# Patient Record
Sex: Female | Born: 1940 | Race: Black or African American | Hispanic: No | Smoking: Never smoker
Health system: Southern US, Community
[De-identification: ages and names within clinical notes are randomized; demographics above are authoritative.]

## PROBLEM LIST (undated history)

## (undated) DIAGNOSIS — M545 Low back pain, unspecified: Secondary | ICD-10-CM

## (undated) DIAGNOSIS — J449 Chronic obstructive pulmonary disease, unspecified: Secondary | ICD-10-CM

## (undated) DIAGNOSIS — R569 Unspecified convulsions: Secondary | ICD-10-CM

## (undated) DIAGNOSIS — G43909 Migraine, unspecified, not intractable, without status migrainosus: Secondary | ICD-10-CM

## (undated) DIAGNOSIS — E669 Obesity, unspecified: Secondary | ICD-10-CM

## (undated) DIAGNOSIS — N3941 Urge incontinence: Secondary | ICD-10-CM

## (undated) DIAGNOSIS — H538 Other visual disturbances: Secondary | ICD-10-CM

## (undated) DIAGNOSIS — J45909 Unspecified asthma, uncomplicated: Secondary | ICD-10-CM

## (undated) DIAGNOSIS — K279 Peptic ulcer, site unspecified, unspecified as acute or chronic, without hemorrhage or perforation: Secondary | ICD-10-CM

## (undated) DIAGNOSIS — I2510 Atherosclerotic heart disease of native coronary artery without angina pectoris: Secondary | ICD-10-CM

## (undated) DIAGNOSIS — D649 Anemia, unspecified: Secondary | ICD-10-CM

## (undated) DIAGNOSIS — Z8719 Personal history of other diseases of the digestive system: Secondary | ICD-10-CM

## (undated) DIAGNOSIS — I10 Essential (primary) hypertension: Secondary | ICD-10-CM

## (undated) DIAGNOSIS — I4891 Unspecified atrial fibrillation: Secondary | ICD-10-CM

## (undated) DIAGNOSIS — E785 Hyperlipidemia, unspecified: Secondary | ICD-10-CM

## (undated) DIAGNOSIS — G4730 Sleep apnea, unspecified: Secondary | ICD-10-CM

## (undated) DIAGNOSIS — G8929 Other chronic pain: Secondary | ICD-10-CM

## (undated) DIAGNOSIS — R410 Disorientation, unspecified: Secondary | ICD-10-CM

## (undated) DIAGNOSIS — I4430 Unspecified atrioventricular block: Secondary | ICD-10-CM

## (undated) DIAGNOSIS — M1990 Unspecified osteoarthritis, unspecified site: Secondary | ICD-10-CM

## (undated) DIAGNOSIS — I82621 Acute embolism and thrombosis of deep veins of right upper extremity: Secondary | ICD-10-CM

## (undated) DIAGNOSIS — I209 Angina pectoris, unspecified: Secondary | ICD-10-CM

## (undated) DIAGNOSIS — K219 Gastro-esophageal reflux disease without esophagitis: Secondary | ICD-10-CM

## (undated) DIAGNOSIS — Z950 Presence of cardiac pacemaker: Secondary | ICD-10-CM

## (undated) DIAGNOSIS — E119 Type 2 diabetes mellitus without complications: Secondary | ICD-10-CM

## (undated) DIAGNOSIS — F0390 Unspecified dementia without behavioral disturbance: Secondary | ICD-10-CM

## (undated) DIAGNOSIS — T8132XA Disruption of internal operation (surgical) wound, not elsewhere classified, initial encounter: Secondary | ICD-10-CM

## (undated) DIAGNOSIS — IMO0002 Reserved for concepts with insufficient information to code with codable children: Secondary | ICD-10-CM

## (undated) DIAGNOSIS — R0601 Orthopnea: Secondary | ICD-10-CM

## (undated) DIAGNOSIS — Z87442 Personal history of urinary calculi: Secondary | ICD-10-CM

## (undated) DIAGNOSIS — I639 Cerebral infarction, unspecified: Secondary | ICD-10-CM

## (undated) HISTORY — DX: Type 2 diabetes mellitus without complications: E11.9

## (undated) HISTORY — PX: THYROIDECTOMY: SHX17

## (undated) HISTORY — DX: Gastro-esophageal reflux disease without esophagitis: K21.9

## (undated) HISTORY — DX: Reserved for concepts with insufficient information to code with codable children: IMO0002

## (undated) HISTORY — DX: Essential (primary) hypertension: I10

## (undated) HISTORY — DX: Unspecified atrioventricular block: I44.30

## (undated) HISTORY — DX: Hyperlipidemia, unspecified: E78.5

## (undated) HISTORY — PX: APPENDECTOMY: SHX54

## (undated) HISTORY — DX: Acute embolism and thrombosis of deep veins of right upper extremity: I82.621

## (undated) HISTORY — PX: RECONSTRUCTIVE REPAIR STERNAL: SUR1094

## (undated) HISTORY — PX: CHOLECYSTECTOMY: SHX55

## (undated) HISTORY — DX: Chronic obstructive pulmonary disease, unspecified: J44.9

## (undated) HISTORY — DX: Obesity, unspecified: E66.9

## (undated) HISTORY — PX: JOINT REPLACEMENT: SHX530

## (undated) HISTORY — DX: Disruption of internal operation (surgical) wound, not elsewhere classified, initial encounter: T81.32XA

## (undated) HISTORY — PX: REPLACEMENT TOTAL KNEE: SUR1224

## (undated) HISTORY — PX: WRIST SURGERY: SHX841

## (undated) HISTORY — DX: Atherosclerotic heart disease of native coronary artery without angina pectoris: I25.10

## (undated) HISTORY — PX: TUBAL LIGATION: SHX77

## (undated) HISTORY — PX: BACK SURGERY: SHX140

## (undated) HISTORY — PX: POSTERIOR LUMBAR FUSION: SHX6036

## (undated) HISTORY — PX: ESOPHAGOGASTRODUODENOSCOPY: SHX1529

## (undated) HISTORY — PX: ABDOMINAL HYSTERECTOMY: SHX81

## (undated) HISTORY — PX: LUMBAR DISC SURGERY: SHX700

## (undated) HISTORY — PX: TOTAL HIP ARTHROPLASTY: SHX124

---

## 1991-12-06 DIAGNOSIS — I4430 Unspecified atrioventricular block: Secondary | ICD-10-CM

## 1991-12-06 HISTORY — PX: INSERT / REPLACE / REMOVE PACEMAKER: SUR710

## 1991-12-06 HISTORY — PX: PACEMAKER PLACEMENT: SHX43

## 1991-12-06 HISTORY — DX: Unspecified atrioventricular block: I44.30

## 1999-05-06 HISTORY — PX: CORONARY ARTERY BYPASS GRAFT: SHX141

## 2001-01-30 ENCOUNTER — Ambulatory Visit (HOSPITAL_COMMUNITY): Admission: RE | Admit: 2001-01-30 | Discharge: 2001-01-30 | Payer: Self-pay | Admitting: Neurosurgery

## 2001-01-30 ENCOUNTER — Encounter: Payer: Self-pay | Admitting: Neurosurgery

## 2001-05-30 ENCOUNTER — Ambulatory Visit (HOSPITAL_COMMUNITY): Admission: RE | Admit: 2001-05-30 | Discharge: 2001-05-30 | Payer: Self-pay | Admitting: Pulmonary Disease

## 2001-06-05 ENCOUNTER — Ambulatory Visit (HOSPITAL_COMMUNITY): Admission: RE | Admit: 2001-06-05 | Discharge: 2001-06-05 | Payer: Self-pay | Admitting: Pulmonary Disease

## 2001-07-16 ENCOUNTER — Ambulatory Visit (HOSPITAL_COMMUNITY): Admission: RE | Admit: 2001-07-16 | Discharge: 2001-07-16 | Payer: Self-pay | Admitting: Pulmonary Disease

## 2001-07-18 ENCOUNTER — Ambulatory Visit (HOSPITAL_COMMUNITY): Admission: RE | Admit: 2001-07-18 | Discharge: 2001-07-18 | Payer: Self-pay | Admitting: Pulmonary Disease

## 2001-07-29 ENCOUNTER — Emergency Department (HOSPITAL_COMMUNITY): Admission: EM | Admit: 2001-07-29 | Discharge: 2001-07-29 | Payer: Self-pay | Admitting: Emergency Medicine

## 2001-07-29 ENCOUNTER — Encounter: Payer: Self-pay | Admitting: Emergency Medicine

## 2001-12-14 ENCOUNTER — Other Ambulatory Visit: Admission: RE | Admit: 2001-12-14 | Discharge: 2001-12-14 | Payer: Self-pay | Admitting: Internal Medicine

## 2002-05-13 ENCOUNTER — Ambulatory Visit (HOSPITAL_COMMUNITY): Admission: RE | Admit: 2002-05-13 | Discharge: 2002-05-13 | Payer: Self-pay | Admitting: Pulmonary Disease

## 2002-05-30 ENCOUNTER — Ambulatory Visit (HOSPITAL_COMMUNITY): Admission: RE | Admit: 2002-05-30 | Discharge: 2002-05-30 | Payer: Self-pay | Admitting: Pulmonary Disease

## 2002-05-31 ENCOUNTER — Encounter: Payer: Self-pay | Admitting: Emergency Medicine

## 2002-05-31 ENCOUNTER — Emergency Department (HOSPITAL_COMMUNITY): Admission: EM | Admit: 2002-05-31 | Discharge: 2002-06-01 | Payer: Self-pay | Admitting: Emergency Medicine

## 2002-08-20 ENCOUNTER — Emergency Department (HOSPITAL_COMMUNITY): Admission: EM | Admit: 2002-08-20 | Discharge: 2002-08-21 | Payer: Self-pay | Admitting: Emergency Medicine

## 2002-08-20 ENCOUNTER — Encounter: Payer: Self-pay | Admitting: Emergency Medicine

## 2002-08-20 ENCOUNTER — Encounter: Payer: Self-pay | Admitting: Orthopedic Surgery

## 2002-08-21 ENCOUNTER — Encounter: Payer: Self-pay | Admitting: Orthopedic Surgery

## 2002-08-23 ENCOUNTER — Emergency Department (HOSPITAL_COMMUNITY): Admission: EM | Admit: 2002-08-23 | Discharge: 2002-08-23 | Payer: Self-pay | Admitting: Emergency Medicine

## 2002-11-01 ENCOUNTER — Ambulatory Visit (HOSPITAL_COMMUNITY): Admission: RE | Admit: 2002-11-01 | Discharge: 2002-11-01 | Payer: Self-pay | Admitting: Cardiology

## 2002-11-01 ENCOUNTER — Encounter: Payer: Self-pay | Admitting: Cardiology

## 2003-06-05 ENCOUNTER — Ambulatory Visit (HOSPITAL_COMMUNITY): Admission: RE | Admit: 2003-06-05 | Discharge: 2003-06-05 | Payer: Self-pay | Admitting: Pulmonary Disease

## 2003-12-10 ENCOUNTER — Encounter (INDEPENDENT_AMBULATORY_CARE_PROVIDER_SITE_OTHER): Payer: Self-pay | Admitting: *Deleted

## 2003-12-10 ENCOUNTER — Ambulatory Visit (HOSPITAL_COMMUNITY): Admission: RE | Admit: 2003-12-10 | Discharge: 2003-12-10 | Payer: Self-pay | Admitting: Orthopedic Surgery

## 2003-12-10 ENCOUNTER — Ambulatory Visit (HOSPITAL_BASED_OUTPATIENT_CLINIC_OR_DEPARTMENT_OTHER): Admission: RE | Admit: 2003-12-10 | Discharge: 2003-12-10 | Payer: Self-pay | Admitting: Orthopedic Surgery

## 2004-06-10 ENCOUNTER — Ambulatory Visit (HOSPITAL_COMMUNITY): Admission: RE | Admit: 2004-06-10 | Discharge: 2004-06-10 | Payer: Self-pay | Admitting: Pulmonary Disease

## 2004-07-28 ENCOUNTER — Emergency Department (HOSPITAL_COMMUNITY): Admission: EM | Admit: 2004-07-28 | Discharge: 2004-07-28 | Payer: Self-pay | Admitting: Emergency Medicine

## 2004-08-10 ENCOUNTER — Emergency Department (HOSPITAL_COMMUNITY): Admission: EM | Admit: 2004-08-10 | Discharge: 2004-08-10 | Payer: Self-pay | Admitting: Emergency Medicine

## 2004-08-20 ENCOUNTER — Ambulatory Visit: Payer: Self-pay | Admitting: Cardiology

## 2004-11-05 ENCOUNTER — Encounter: Payer: Self-pay | Admitting: Orthopedic Surgery

## 2004-12-05 ENCOUNTER — Encounter: Payer: Self-pay | Admitting: Orthopedic Surgery

## 2004-12-31 ENCOUNTER — Ambulatory Visit (HOSPITAL_COMMUNITY): Admission: RE | Admit: 2004-12-31 | Discharge: 2004-12-31 | Payer: Self-pay | Admitting: Pulmonary Disease

## 2005-01-02 ENCOUNTER — Emergency Department (HOSPITAL_COMMUNITY): Admission: EM | Admit: 2005-01-02 | Discharge: 2005-01-03 | Payer: Self-pay | Admitting: Emergency Medicine

## 2005-01-05 ENCOUNTER — Encounter: Payer: Self-pay | Admitting: Orthopedic Surgery

## 2005-02-02 ENCOUNTER — Encounter: Payer: Self-pay | Admitting: Orthopedic Surgery

## 2005-02-18 ENCOUNTER — Emergency Department (HOSPITAL_COMMUNITY): Admission: EM | Admit: 2005-02-18 | Discharge: 2005-02-18 | Payer: Self-pay | Admitting: Emergency Medicine

## 2005-04-06 ENCOUNTER — Ambulatory Visit: Payer: Self-pay | Admitting: Cardiology

## 2005-04-22 ENCOUNTER — Ambulatory Visit: Payer: Self-pay | Admitting: Internal Medicine

## 2005-04-27 ENCOUNTER — Ambulatory Visit: Payer: Self-pay | Admitting: Internal Medicine

## 2005-04-27 ENCOUNTER — Ambulatory Visit (HOSPITAL_COMMUNITY): Admission: RE | Admit: 2005-04-27 | Discharge: 2005-04-27 | Payer: Self-pay | Admitting: Internal Medicine

## 2005-04-27 HISTORY — PX: COLONOSCOPY: SHX174

## 2005-05-06 ENCOUNTER — Ambulatory Visit: Payer: Self-pay | Admitting: Cardiology

## 2005-06-17 ENCOUNTER — Ambulatory Visit (HOSPITAL_COMMUNITY): Admission: RE | Admit: 2005-06-17 | Discharge: 2005-06-17 | Payer: Self-pay | Admitting: Pulmonary Disease

## 2005-07-08 ENCOUNTER — Ambulatory Visit (HOSPITAL_COMMUNITY): Admission: RE | Admit: 2005-07-08 | Discharge: 2005-07-08 | Payer: Self-pay | Admitting: Pulmonary Disease

## 2005-07-25 ENCOUNTER — Encounter: Payer: Self-pay | Admitting: Orthopedic Surgery

## 2005-08-05 ENCOUNTER — Encounter: Payer: Self-pay | Admitting: Orthopedic Surgery

## 2005-08-09 ENCOUNTER — Ambulatory Visit: Payer: Self-pay | Admitting: Cardiology

## 2005-09-04 ENCOUNTER — Encounter: Payer: Self-pay | Admitting: Orthopedic Surgery

## 2005-10-14 ENCOUNTER — Encounter: Payer: Self-pay | Admitting: Orthopedic Surgery

## 2005-11-04 ENCOUNTER — Encounter: Payer: Self-pay | Admitting: Orthopedic Surgery

## 2005-11-08 ENCOUNTER — Ambulatory Visit: Payer: Self-pay | Admitting: Cardiology

## 2005-11-28 ENCOUNTER — Emergency Department (HOSPITAL_COMMUNITY): Admission: EM | Admit: 2005-11-28 | Discharge: 2005-11-28 | Payer: Self-pay | Admitting: Emergency Medicine

## 2005-12-02 ENCOUNTER — Ambulatory Visit (HOSPITAL_COMMUNITY): Payer: Self-pay | Admitting: Pulmonary Disease

## 2005-12-02 ENCOUNTER — Encounter (HOSPITAL_COMMUNITY): Admission: RE | Admit: 2005-12-02 | Discharge: 2006-01-01 | Payer: Self-pay | Admitting: Pulmonary Disease

## 2005-12-12 ENCOUNTER — Ambulatory Visit: Payer: Self-pay | Admitting: Cardiology

## 2006-01-19 ENCOUNTER — Ambulatory Visit: Payer: Self-pay | Admitting: Cardiology
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
## 2006-02-20 ENCOUNTER — Ambulatory Visit: Payer: Self-pay | Admitting: Cardiology

## 2006-04-10 ENCOUNTER — Ambulatory Visit: Payer: Self-pay | Admitting: Cardiology

## 2006-04-12 ENCOUNTER — Ambulatory Visit: Payer: Self-pay | Admitting: Internal Medicine

## 2006-04-26 ENCOUNTER — Ambulatory Visit: Payer: Self-pay | Admitting: Internal Medicine

## 2006-04-26 ENCOUNTER — Ambulatory Visit (HOSPITAL_COMMUNITY): Admission: RE | Admit: 2006-04-26 | Discharge: 2006-04-26 | Payer: Self-pay | Admitting: Internal Medicine

## 2006-05-10 ENCOUNTER — Ambulatory Visit: Payer: Self-pay | Admitting: Cardiology

## 2006-06-15 ENCOUNTER — Ambulatory Visit: Payer: Self-pay | Admitting: Cardiology

## 2006-06-19 ENCOUNTER — Ambulatory Visit (HOSPITAL_COMMUNITY): Admission: RE | Admit: 2006-06-19 | Discharge: 2006-06-19 | Payer: Self-pay | Admitting: Pulmonary Disease

## 2006-07-28 ENCOUNTER — Ambulatory Visit: Payer: Self-pay | Admitting: Internal Medicine

## 2006-08-09 ENCOUNTER — Ambulatory Visit: Payer: Self-pay | Admitting: Cardiology

## 2006-08-18 ENCOUNTER — Emergency Department (HOSPITAL_COMMUNITY): Admission: EM | Admit: 2006-08-18 | Discharge: 2006-08-18 | Payer: Self-pay | Admitting: Emergency Medicine

## 2006-09-01 ENCOUNTER — Ambulatory Visit: Payer: Self-pay | Admitting: Internal Medicine

## 2006-09-14 ENCOUNTER — Ambulatory Visit: Payer: Self-pay | Admitting: Cardiology

## 2006-10-06 ENCOUNTER — Ambulatory Visit (HOSPITAL_COMMUNITY): Admission: RE | Admit: 2006-10-06 | Discharge: 2006-10-06 | Payer: Self-pay | Admitting: Internal Medicine

## 2006-10-06 ENCOUNTER — Ambulatory Visit: Payer: Self-pay | Admitting: Internal Medicine

## 2006-10-12 ENCOUNTER — Ambulatory Visit: Payer: Self-pay | Admitting: Cardiology

## 2006-11-08 ENCOUNTER — Ambulatory Visit: Payer: Self-pay | Admitting: Cardiology

## 2006-12-07 ENCOUNTER — Ambulatory Visit: Payer: Self-pay | Admitting: Cardiology

## 2007-01-04 IMAGING — CR DG CHEST 2V
2 series · 2 of 2 positions shown · non-contrast
Comparison: none

CLINICAL DATA: Chest pain, shortness of breath

[view not recorded (1 of 2)]
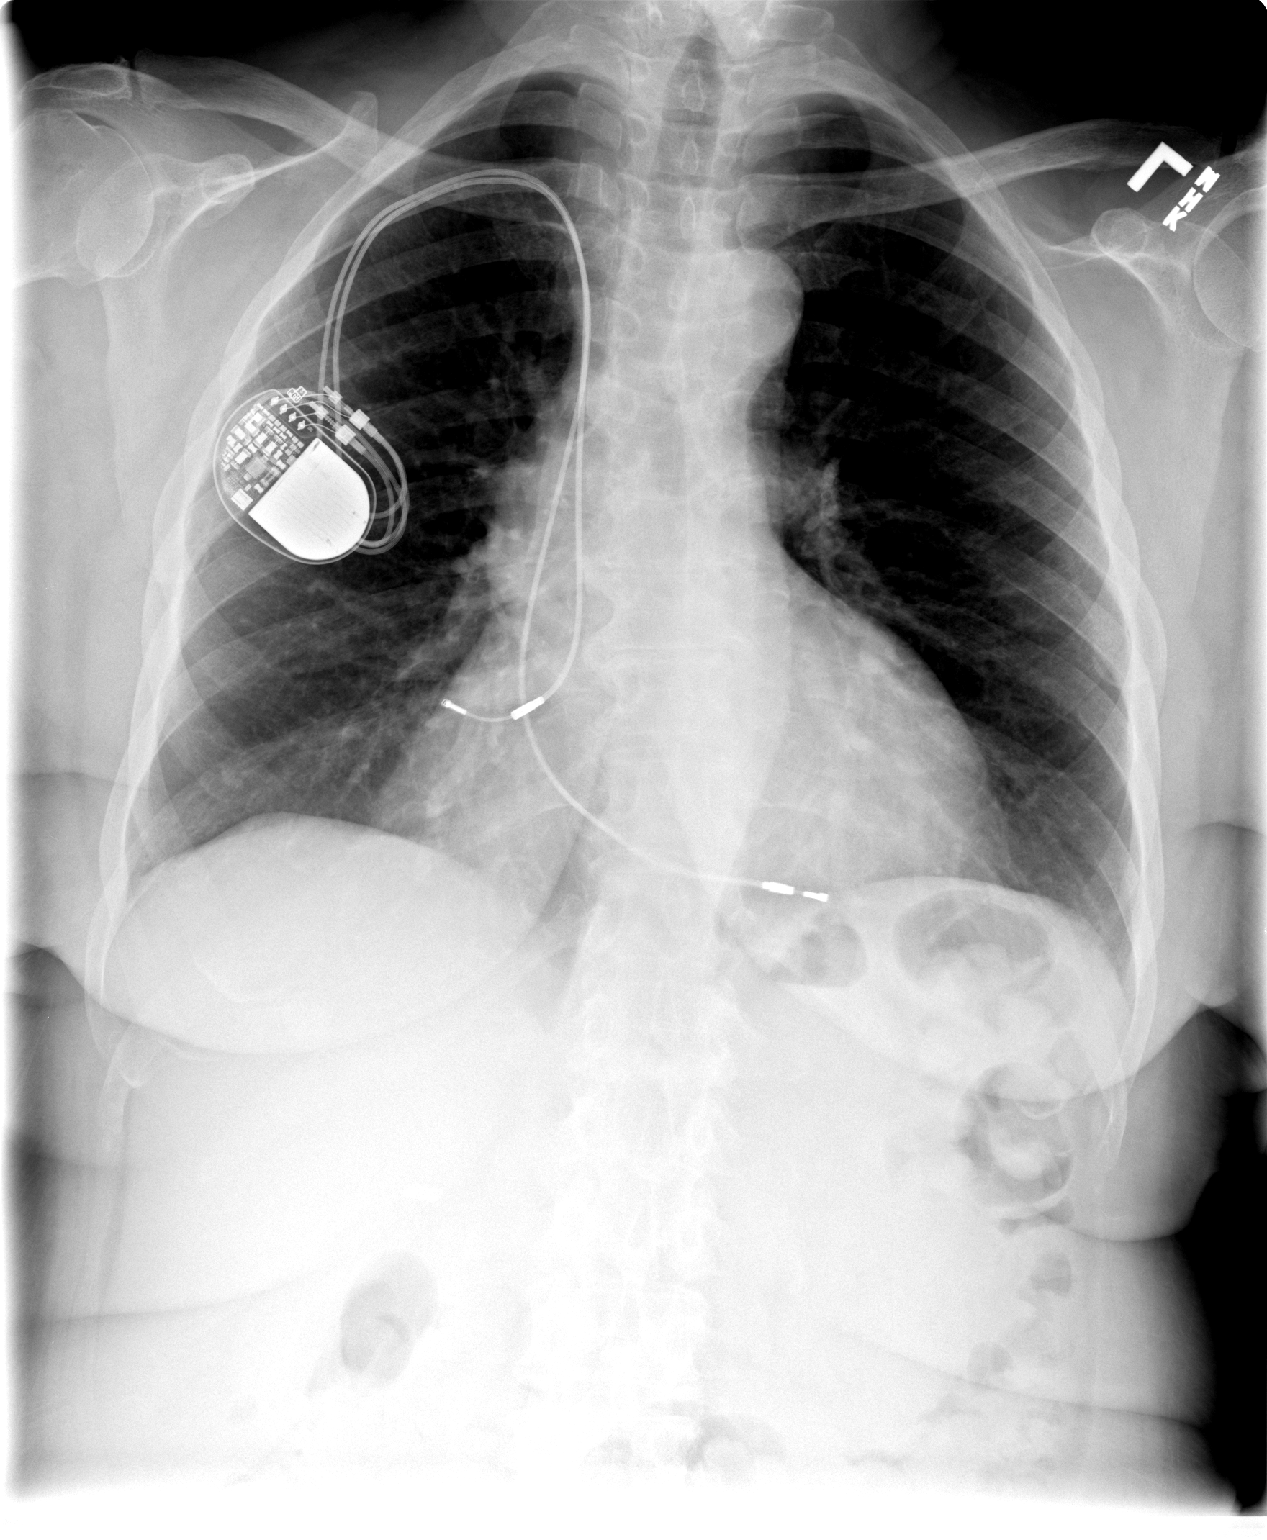

[view not recorded (2 of 2)]
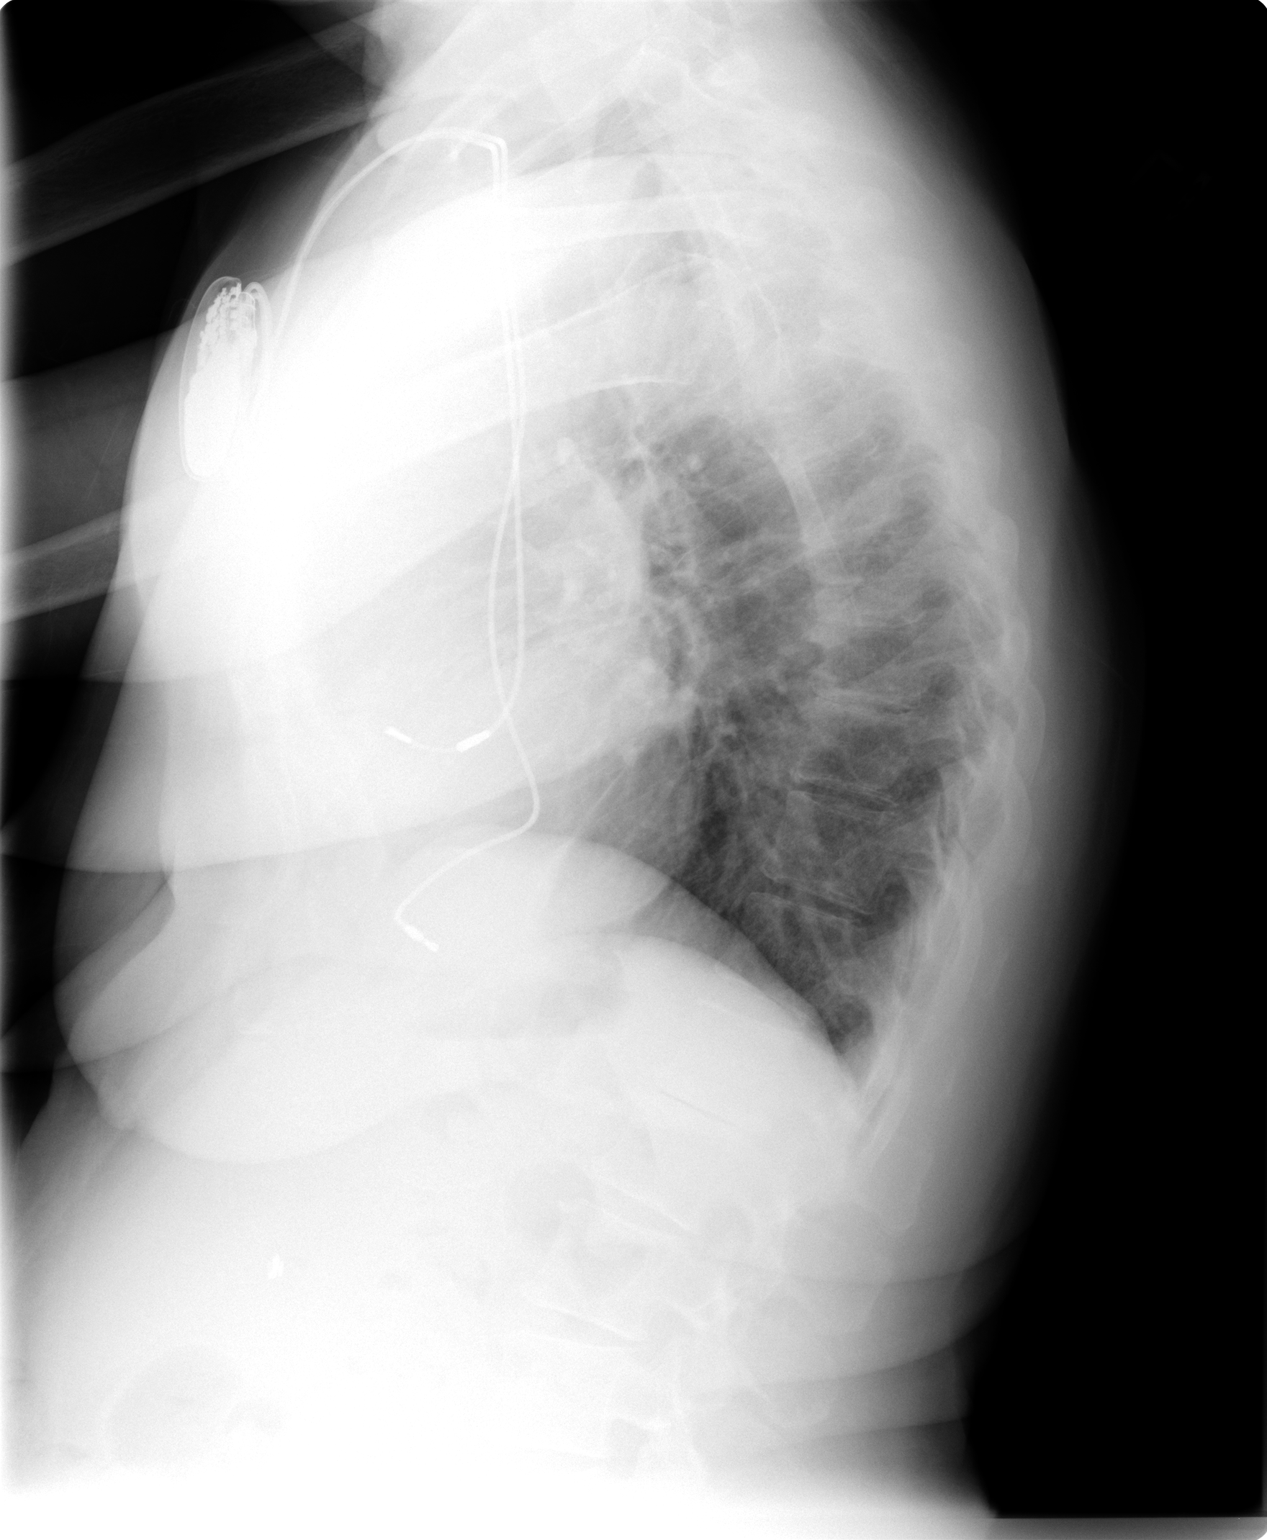

[2 of 2 positions shown; findings below may reference images not displayed]

Chest 2 view:

Comparison 02/18/2005. Right subclavian   dual-lead pacemaker stable position.
Mild enlargement of cardiac silhouette   stable. No effusion. Lungs are clear.
Mildly tortuous thoracic aorta. Vascular clips in the right upper abdomen.
Degenerative spine changes at the thoracolumbar junction incidentally noted.
IMPRESSION: 1. Stable mild cardiomegaly.

## 2007-02-08 ENCOUNTER — Ambulatory Visit: Payer: Self-pay | Admitting: Cardiology

## 2007-03-09 ENCOUNTER — Ambulatory Visit: Payer: Self-pay | Admitting: Cardiology

## 2007-05-02 ENCOUNTER — Ambulatory Visit: Payer: Self-pay | Admitting: Cardiology

## 2007-06-22 ENCOUNTER — Ambulatory Visit (HOSPITAL_COMMUNITY): Admission: RE | Admit: 2007-06-22 | Discharge: 2007-06-22 | Payer: Self-pay | Admitting: Pulmonary Disease

## 2007-08-07 ENCOUNTER — Ambulatory Visit: Payer: Self-pay | Admitting: Cardiology

## 2007-12-15 ENCOUNTER — Emergency Department (HOSPITAL_COMMUNITY): Admission: EM | Admit: 2007-12-15 | Discharge: 2007-12-15 | Payer: Self-pay | Admitting: Emergency Medicine

## 2007-12-17 ENCOUNTER — Observation Stay (HOSPITAL_COMMUNITY): Admission: EM | Admit: 2007-12-17 | Discharge: 2007-12-19 | Payer: Self-pay | Admitting: Emergency Medicine

## 2007-12-18 ENCOUNTER — Ambulatory Visit: Payer: Self-pay | Admitting: Gastroenterology

## 2008-01-04 ENCOUNTER — Ambulatory Visit: Payer: Self-pay | Admitting: Internal Medicine

## 2008-01-16 ENCOUNTER — Encounter: Payer: Self-pay | Admitting: Orthopedic Surgery

## 2008-01-18 ENCOUNTER — Ambulatory Visit (HOSPITAL_COMMUNITY): Admission: RE | Admit: 2008-01-18 | Discharge: 2008-01-18 | Payer: Self-pay | Admitting: Internal Medicine

## 2008-01-18 ENCOUNTER — Ambulatory Visit: Payer: Self-pay | Admitting: Internal Medicine

## 2008-02-03 ENCOUNTER — Encounter: Payer: Self-pay | Admitting: Orthopedic Surgery

## 2008-02-13 ENCOUNTER — Ambulatory Visit: Payer: Self-pay | Admitting: Cardiology

## 2008-03-05 ENCOUNTER — Encounter: Payer: Self-pay | Admitting: Orthopedic Surgery

## 2008-04-17 ENCOUNTER — Ambulatory Visit: Payer: Self-pay | Admitting: Cardiology

## 2008-06-24 ENCOUNTER — Ambulatory Visit (HOSPITAL_COMMUNITY): Admission: RE | Admit: 2008-06-24 | Discharge: 2008-06-24 | Payer: Self-pay | Admitting: Pulmonary Disease

## 2008-07-25 ENCOUNTER — Ambulatory Visit: Payer: Self-pay | Admitting: Cardiology

## 2008-09-26 ENCOUNTER — Encounter: Payer: Self-pay | Admitting: Orthopedic Surgery

## 2008-09-26 ENCOUNTER — Encounter: Admission: RE | Admit: 2008-09-26 | Discharge: 2008-09-26 | Payer: Self-pay | Admitting: Neurological Surgery

## 2008-10-16 ENCOUNTER — Inpatient Hospital Stay (HOSPITAL_COMMUNITY): Admission: RE | Admit: 2008-10-16 | Discharge: 2008-10-19 | Payer: Self-pay | Admitting: Neurological Surgery

## 2008-10-24 ENCOUNTER — Emergency Department (HOSPITAL_COMMUNITY): Admission: EM | Admit: 2008-10-24 | Discharge: 2008-10-24 | Payer: Self-pay | Admitting: Emergency Medicine

## 2008-11-07 ENCOUNTER — Ambulatory Visit (HOSPITAL_COMMUNITY): Admission: RE | Admit: 2008-11-07 | Discharge: 2008-11-07 | Payer: Self-pay | Admitting: Neurological Surgery

## 2008-12-08 ENCOUNTER — Encounter: Admission: RE | Admit: 2008-12-08 | Discharge: 2008-12-08 | Payer: Self-pay | Admitting: Neurological Surgery

## 2009-01-08 ENCOUNTER — Encounter (INDEPENDENT_AMBULATORY_CARE_PROVIDER_SITE_OTHER): Payer: Self-pay | Admitting: *Deleted

## 2009-01-08 ENCOUNTER — Ambulatory Visit: Payer: Self-pay

## 2009-02-03 ENCOUNTER — Encounter: Admission: RE | Admit: 2009-02-03 | Discharge: 2009-02-03 | Payer: Self-pay | Admitting: Neurological Surgery

## 2009-05-05 ENCOUNTER — Encounter: Admission: RE | Admit: 2009-05-05 | Discharge: 2009-05-05 | Payer: Self-pay | Admitting: Neurological Surgery

## 2009-05-05 ENCOUNTER — Encounter: Payer: Self-pay | Admitting: Orthopedic Surgery

## 2009-05-12 DIAGNOSIS — I219 Acute myocardial infarction, unspecified: Secondary | ICD-10-CM | POA: Insufficient documentation

## 2009-05-12 DIAGNOSIS — K222 Esophageal obstruction: Secondary | ICD-10-CM

## 2009-05-12 DIAGNOSIS — K259 Gastric ulcer, unspecified as acute or chronic, without hemorrhage or perforation: Secondary | ICD-10-CM | POA: Insufficient documentation

## 2009-05-12 DIAGNOSIS — I499 Cardiac arrhythmia, unspecified: Secondary | ICD-10-CM | POA: Insufficient documentation

## 2009-05-12 DIAGNOSIS — K5730 Diverticulosis of large intestine without perforation or abscess without bleeding: Secondary | ICD-10-CM | POA: Insufficient documentation

## 2009-05-12 DIAGNOSIS — K3184 Gastroparesis: Secondary | ICD-10-CM

## 2009-05-13 ENCOUNTER — Encounter: Payer: Self-pay | Admitting: Cardiology

## 2009-05-13 ENCOUNTER — Ambulatory Visit: Payer: Self-pay | Admitting: Cardiology

## 2009-05-13 DIAGNOSIS — R079 Chest pain, unspecified: Secondary | ICD-10-CM

## 2009-05-13 DIAGNOSIS — Z950 Presence of cardiac pacemaker: Secondary | ICD-10-CM

## 2009-05-15 ENCOUNTER — Inpatient Hospital Stay (HOSPITAL_BASED_OUTPATIENT_CLINIC_OR_DEPARTMENT_OTHER): Admission: RE | Admit: 2009-05-15 | Discharge: 2009-05-15 | Payer: Self-pay | Admitting: Cardiology

## 2009-05-15 ENCOUNTER — Ambulatory Visit: Payer: Self-pay | Admitting: Cardiology

## 2009-05-18 ENCOUNTER — Ambulatory Visit: Payer: Self-pay | Admitting: Thoracic Surgery (Cardiothoracic Vascular Surgery)

## 2009-05-27 ENCOUNTER — Ambulatory Visit
Admission: RE | Admit: 2009-05-27 | Discharge: 2009-05-27 | Payer: Self-pay | Admitting: Thoracic Surgery (Cardiothoracic Vascular Surgery)

## 2009-05-27 ENCOUNTER — Encounter: Payer: Self-pay | Admitting: Thoracic Surgery (Cardiothoracic Vascular Surgery)

## 2009-05-28 ENCOUNTER — Inpatient Hospital Stay (HOSPITAL_COMMUNITY)
Admission: RE | Admit: 2009-05-28 | Discharge: 2009-06-01 | Payer: Self-pay | Admitting: Thoracic Surgery (Cardiothoracic Vascular Surgery)

## 2009-05-28 ENCOUNTER — Ambulatory Visit: Payer: Self-pay | Admitting: Cardiology

## 2009-05-28 ENCOUNTER — Ambulatory Visit: Payer: Self-pay | Admitting: Thoracic Surgery (Cardiothoracic Vascular Surgery)

## 2009-06-06 ENCOUNTER — Inpatient Hospital Stay (HOSPITAL_COMMUNITY): Admission: EM | Admit: 2009-06-06 | Discharge: 2009-07-07 | Payer: Self-pay | Admitting: Emergency Medicine

## 2009-06-14 IMAGING — CR DG CHEST 2V
2 series · 2 of 2 positions shown · non-contrast
Comparison: 07/08/05.

CLINICAL DATA: Chest pain/knot in chest. 
 CHEST - 2 VIEW:

[view not recorded (1 of 2)]
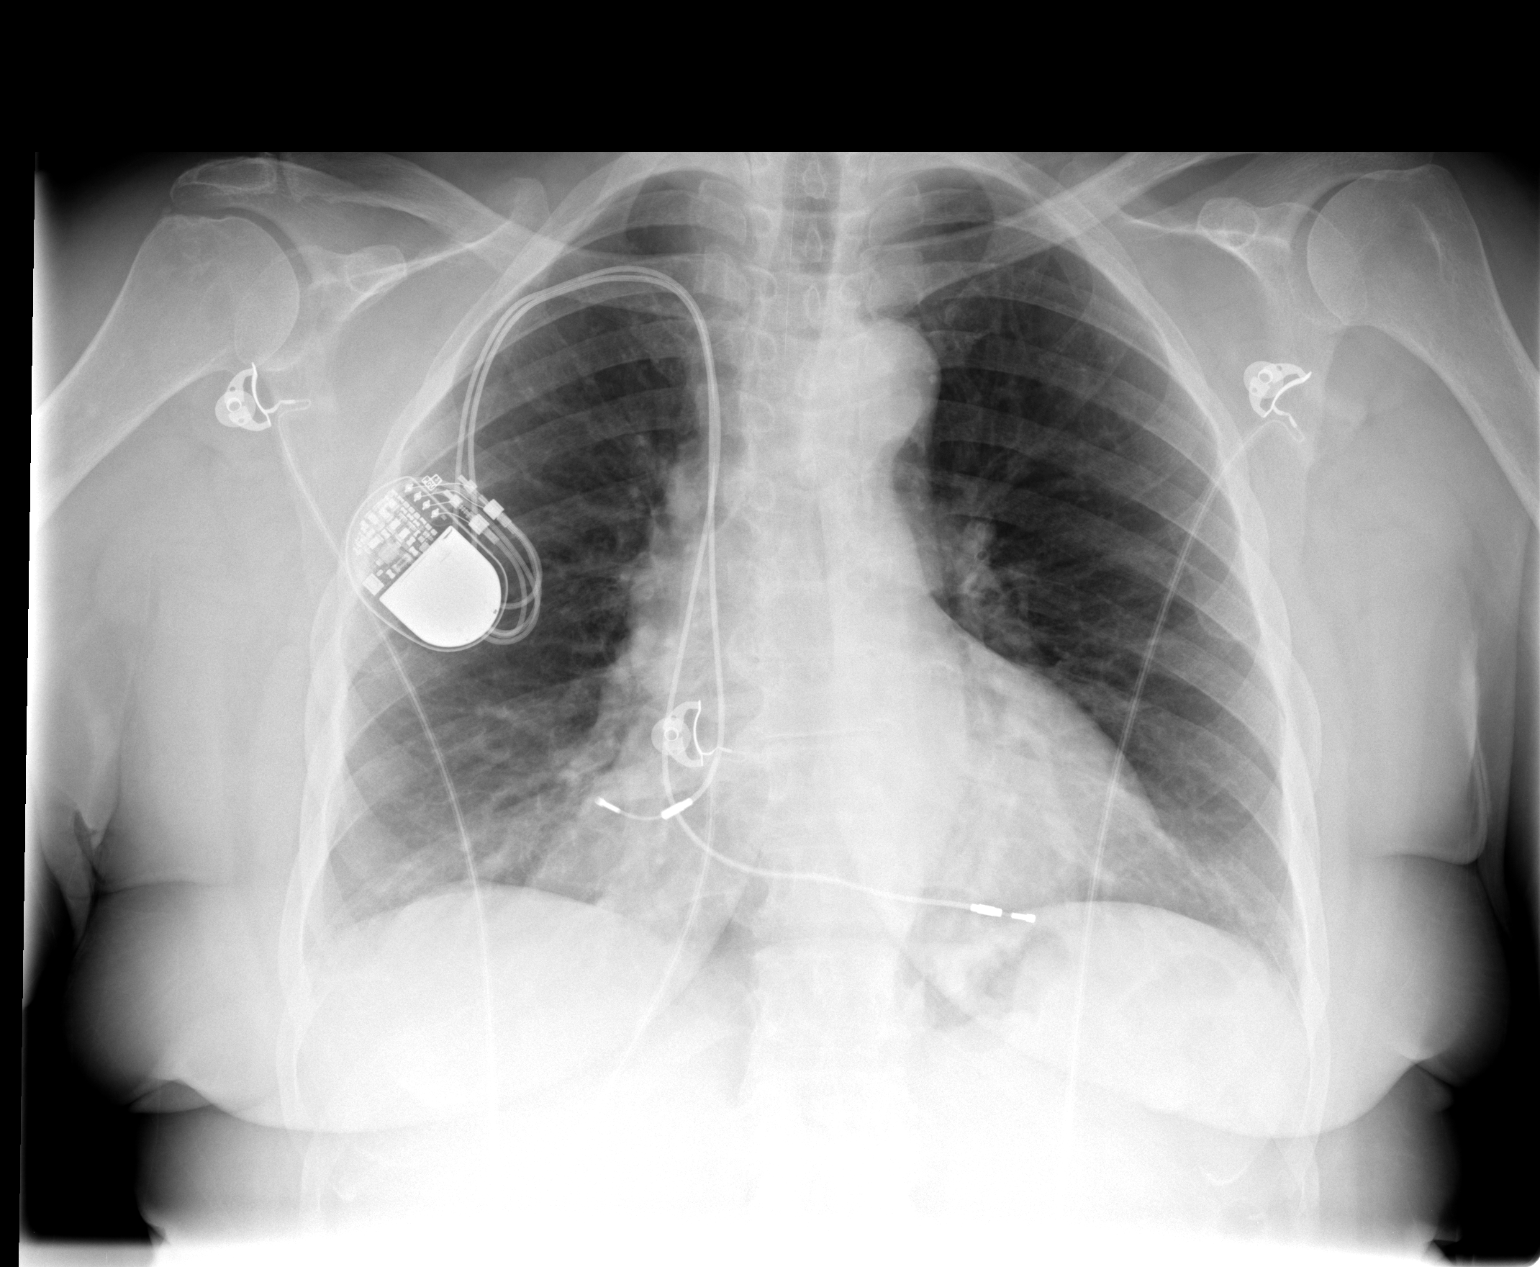

[view not recorded (2 of 2)]
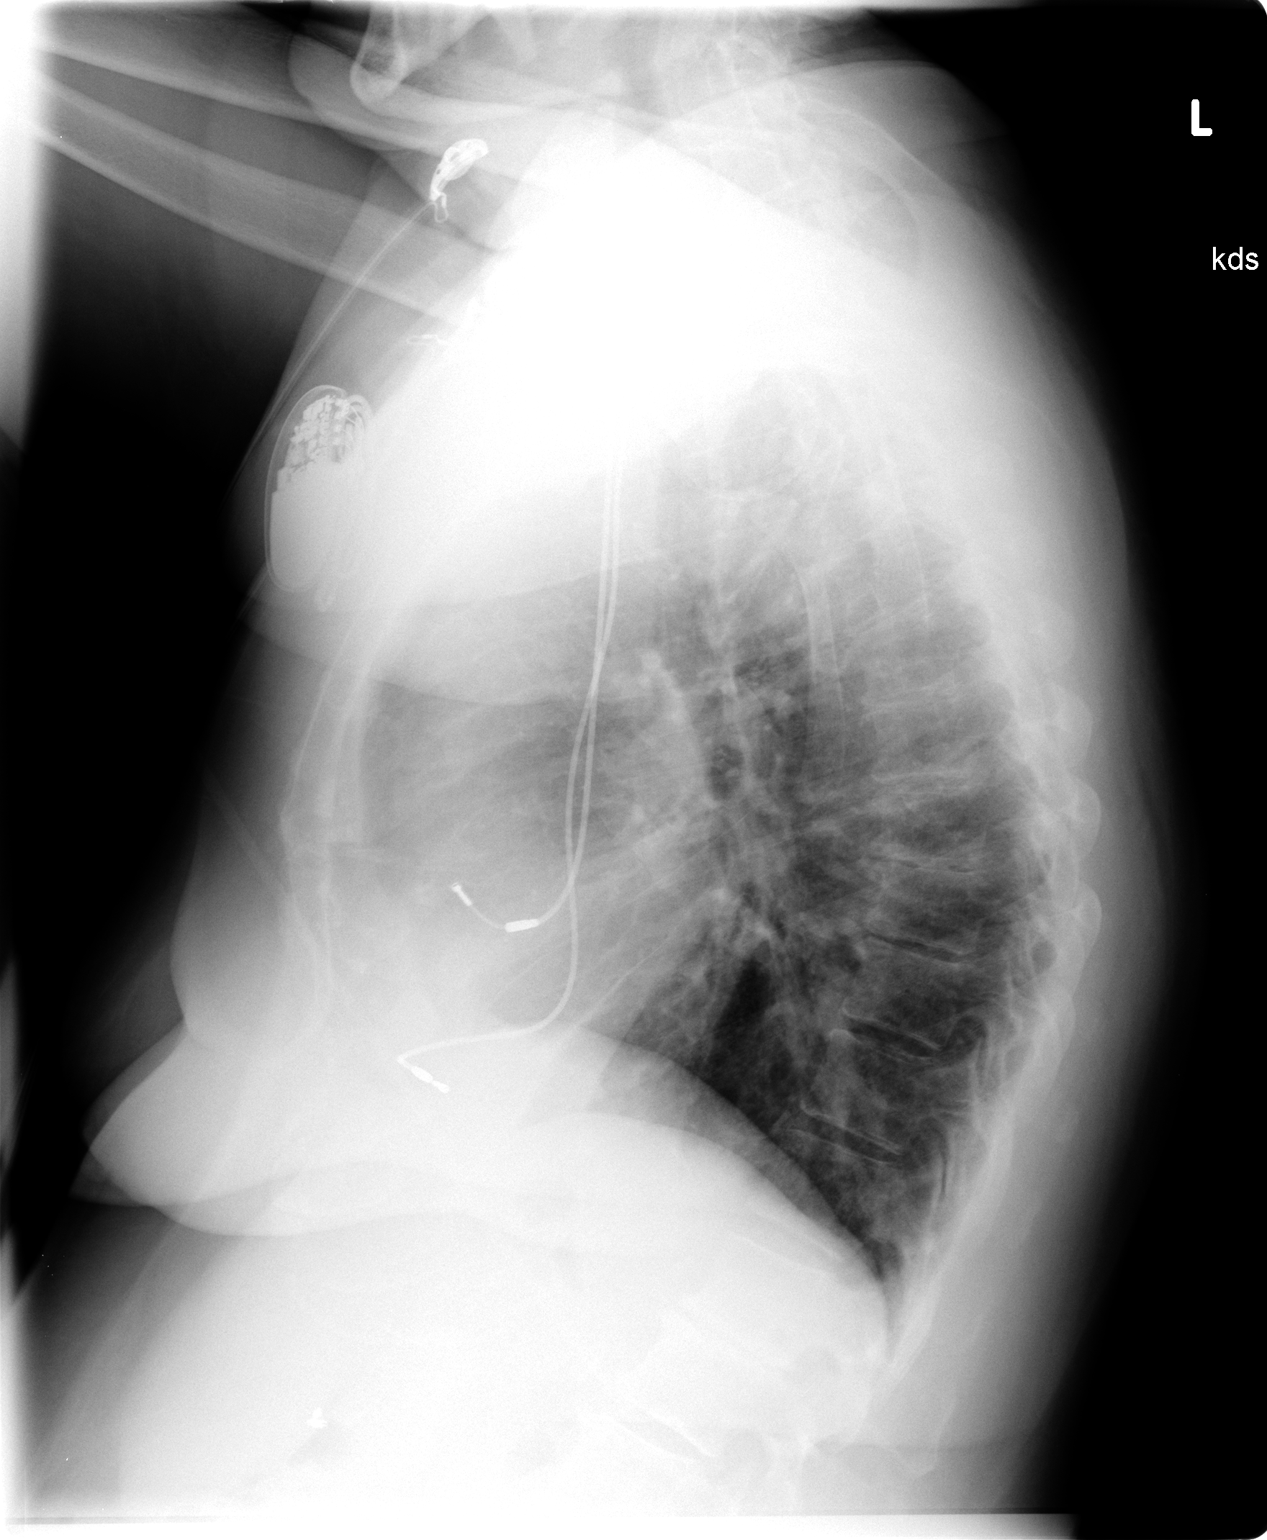

[2 of 2 positions shown; findings below may reference images not displayed]

FINDINGS: Heart mildly enlarged as before.  Dual lead pacer unchanged.  No congestive heart failure or active disease.  Osseous structures intact.
IMPRESSION: Cardiomegaly and pacemaker ? no active disease.

## 2009-06-14 IMAGING — NM NM PULM PERFUSION & VENT (REBREATHING & WASHOUT)
2 series · 12 of 12 positions shown · non-contrast
Comparison: none

HISTORY: Chest pain

[Series 1: lung scan · 3.20mm/px · 6 of 16 frames shown (1 of 2)]
[frame 2/16  full-range]
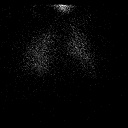
[frame 4/16  full-range]
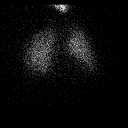
[frame 7/16  full-range]
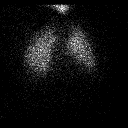
[frame 10/16  full-range]
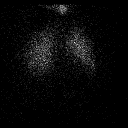
[frame 12/16  full-range]
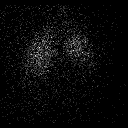
[frame 15/16]
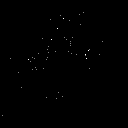

[Series 1: lung scan · 3.20mm/px · 6 of 16 frames shown (2 of 2)]
[frame 2/16]
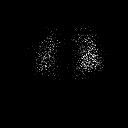
[frame 4/16  full-range]
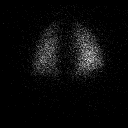
[frame 7/16  full-range]
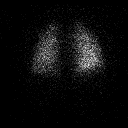
[frame 10/16  full-range]
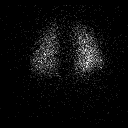
[frame 12/16]
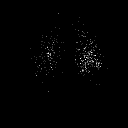
[frame 15/16]
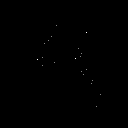

[12 of 12 positions shown; findings below may reference images not displayed]

NUCLEAR MEDICINE PULMONARY PERFUSION AND VENTILATION SCANS:

Perfusion lung scan performed in 8 projections following 5 mCi Tc 99m MAA.
Xenon ventilation exam performed in anterior and posterior projections using 10
mCi [id number] gas.
Correlation made with chest  radiograph of 12/17/2007

Ventilation exam is normal.
Perfusion lung scan shows single tiny subsegmental perfusion defect posterior
aspect of left upper lobe.
No other segmental or subsegmental perfusion defects identified.
Minimal cardiac enlargement noted.
IMPRESSION: Low probability for pulmonary embolic disease.

## 2009-06-17 ENCOUNTER — Ambulatory Visit: Payer: Self-pay | Admitting: Infectious Diseases

## 2009-06-24 ENCOUNTER — Telehealth: Payer: Self-pay | Admitting: Cardiology

## 2009-06-26 ENCOUNTER — Ambulatory Visit: Payer: Self-pay | Admitting: Thoracic Surgery (Cardiothoracic Vascular Surgery)

## 2009-06-27 ENCOUNTER — Encounter: Payer: Self-pay | Admitting: Cardiology

## 2009-06-29 ENCOUNTER — Encounter (INDEPENDENT_AMBULATORY_CARE_PROVIDER_SITE_OTHER): Payer: Self-pay | Admitting: Plastic Surgery

## 2009-07-05 DIAGNOSIS — T8132XA Disruption of internal operation (surgical) wound, not elsewhere classified, initial encounter: Secondary | ICD-10-CM

## 2009-07-05 DIAGNOSIS — T81328A Disruption or dehiscence of closure of other specified internal operation (surgical) wound, initial encounter: Secondary | ICD-10-CM

## 2009-07-05 HISTORY — DX: Disruption of internal operation (surgical) wound, not elsewhere classified, initial encounter: T81.32XA

## 2009-07-05 HISTORY — DX: Disruption or dehiscence of closure of other specified internal operation (surgical) wound, initial encounter: T81.328A

## 2009-07-20 ENCOUNTER — Encounter
Admission: RE | Admit: 2009-07-20 | Discharge: 2009-07-20 | Payer: Self-pay | Admitting: Thoracic Surgery (Cardiothoracic Vascular Surgery)

## 2009-07-20 ENCOUNTER — Ambulatory Visit: Payer: Self-pay | Admitting: Thoracic Surgery (Cardiothoracic Vascular Surgery)

## 2009-07-20 ENCOUNTER — Encounter: Payer: Self-pay | Admitting: Cardiology

## 2009-07-31 ENCOUNTER — Encounter: Payer: Self-pay | Admitting: Cardiology

## 2009-08-03 DIAGNOSIS — T8140XA Infection following a procedure, unspecified, initial encounter: Secondary | ICD-10-CM

## 2009-08-04 ENCOUNTER — Ambulatory Visit: Payer: Self-pay | Admitting: Cardiology

## 2009-08-07 ENCOUNTER — Telehealth: Payer: Self-pay | Admitting: Cardiology

## 2009-08-12 ENCOUNTER — Encounter: Payer: Self-pay | Admitting: Cardiology

## 2009-08-17 ENCOUNTER — Encounter (HOSPITAL_COMMUNITY): Admission: RE | Admit: 2009-08-17 | Discharge: 2009-09-02 | Payer: Self-pay | Admitting: Cardiology

## 2009-08-26 ENCOUNTER — Ambulatory Visit (HOSPITAL_COMMUNITY): Admission: RE | Admit: 2009-08-26 | Discharge: 2009-08-26 | Payer: Self-pay | Admitting: Pulmonary Disease

## 2009-09-01 ENCOUNTER — Telehealth: Payer: Self-pay | Admitting: Cardiology

## 2009-09-04 ENCOUNTER — Encounter (HOSPITAL_COMMUNITY): Admission: RE | Admit: 2009-09-04 | Discharge: 2009-10-04 | Payer: Self-pay | Admitting: Cardiology

## 2009-09-04 ENCOUNTER — Encounter: Payer: Self-pay | Admitting: Cardiology

## 2009-09-07 ENCOUNTER — Encounter: Payer: Self-pay | Admitting: Cardiology

## 2009-09-09 ENCOUNTER — Telehealth (INDEPENDENT_AMBULATORY_CARE_PROVIDER_SITE_OTHER): Payer: Self-pay | Admitting: *Deleted

## 2009-09-11 ENCOUNTER — Ambulatory Visit: Payer: Self-pay | Admitting: Internal Medicine

## 2009-09-16 ENCOUNTER — Encounter: Payer: Self-pay | Admitting: Cardiology

## 2009-10-05 ENCOUNTER — Encounter (HOSPITAL_COMMUNITY): Admission: RE | Admit: 2009-10-05 | Discharge: 2009-11-04 | Payer: Self-pay | Admitting: Cardiology

## 2009-10-12 ENCOUNTER — Encounter: Payer: Self-pay | Admitting: Orthopedic Surgery

## 2009-10-26 ENCOUNTER — Ambulatory Visit: Payer: Self-pay | Admitting: Thoracic Surgery (Cardiothoracic Vascular Surgery)

## 2009-10-26 ENCOUNTER — Encounter: Payer: Self-pay | Admitting: Cardiology

## 2009-11-04 ENCOUNTER — Encounter (HOSPITAL_COMMUNITY): Admission: RE | Admit: 2009-11-04 | Discharge: 2009-12-01 | Payer: Self-pay | Admitting: Cardiology

## 2009-11-10 ENCOUNTER — Encounter: Payer: Self-pay | Admitting: Orthopedic Surgery

## 2009-11-11 ENCOUNTER — Ambulatory Visit: Payer: Self-pay | Admitting: Orthopedic Surgery

## 2009-11-11 DIAGNOSIS — Z96659 Presence of unspecified artificial knee joint: Secondary | ICD-10-CM | POA: Insufficient documentation

## 2009-11-11 DIAGNOSIS — M25569 Pain in unspecified knee: Secondary | ICD-10-CM

## 2009-11-12 ENCOUNTER — Encounter: Payer: Self-pay | Admitting: Orthopedic Surgery

## 2009-11-18 ENCOUNTER — Ambulatory Visit: Payer: Self-pay | Admitting: Cardiology

## 2009-12-07 ENCOUNTER — Ambulatory Visit: Payer: Self-pay | Admitting: Orthopedic Surgery

## 2009-12-07 DIAGNOSIS — IMO0002 Reserved for concepts with insufficient information to code with codable children: Secondary | ICD-10-CM | POA: Insufficient documentation

## 2009-12-18 ENCOUNTER — Encounter: Payer: Self-pay | Admitting: Cardiology

## 2009-12-27 ENCOUNTER — Emergency Department (HOSPITAL_COMMUNITY): Admission: EM | Admit: 2009-12-27 | Discharge: 2009-12-27 | Payer: Self-pay | Admitting: Emergency Medicine

## 2009-12-27 ENCOUNTER — Encounter: Payer: Self-pay | Admitting: Orthopedic Surgery

## 2009-12-28 ENCOUNTER — Encounter: Admission: RE | Admit: 2009-12-28 | Discharge: 2009-12-28 | Payer: Self-pay | Admitting: Neurological Surgery

## 2009-12-28 ENCOUNTER — Encounter: Payer: Self-pay | Admitting: Orthopedic Surgery

## 2009-12-28 ENCOUNTER — Encounter: Payer: Self-pay | Admitting: Cardiology

## 2010-01-04 ENCOUNTER — Ambulatory Visit: Payer: Self-pay | Admitting: Orthopedic Surgery

## 2010-01-04 DIAGNOSIS — M19029 Primary osteoarthritis, unspecified elbow: Secondary | ICD-10-CM | POA: Insufficient documentation

## 2010-01-11 ENCOUNTER — Encounter (INDEPENDENT_AMBULATORY_CARE_PROVIDER_SITE_OTHER): Payer: Self-pay | Admitting: *Deleted

## 2010-01-11 ENCOUNTER — Observation Stay (HOSPITAL_COMMUNITY): Admission: EM | Admit: 2010-01-11 | Discharge: 2010-01-13 | Payer: Self-pay | Admitting: Emergency Medicine

## 2010-01-11 ENCOUNTER — Ambulatory Visit: Payer: Self-pay | Admitting: Cardiology

## 2010-01-11 LAB — CONVERTED CEMR LAB
BUN: 15 mg/dL
CO2: 28 meq/L
Calcium: 8.8 mg/dL
Chloride: 104 meq/L
Potassium: 3.5 meq/L

## 2010-01-12 ENCOUNTER — Encounter: Payer: Self-pay | Admitting: Cardiology

## 2010-01-13 ENCOUNTER — Encounter: Payer: Self-pay | Admitting: Cardiology

## 2010-01-19 ENCOUNTER — Encounter: Payer: Self-pay | Admitting: *Deleted

## 2010-01-21 ENCOUNTER — Ambulatory Visit: Payer: Self-pay | Admitting: Cardiology

## 2010-01-21 ENCOUNTER — Encounter (HOSPITAL_COMMUNITY): Admission: RE | Admit: 2010-01-21 | Discharge: 2010-02-20 | Payer: Self-pay | Admitting: Cardiology

## 2010-02-01 ENCOUNTER — Ambulatory Visit: Payer: Self-pay | Admitting: Orthopedic Surgery

## 2010-02-02 ENCOUNTER — Telehealth: Payer: Self-pay | Admitting: Orthopedic Surgery

## 2010-02-22 ENCOUNTER — Ambulatory Visit: Payer: Self-pay | Admitting: Cardiology

## 2010-02-22 DIAGNOSIS — R0602 Shortness of breath: Secondary | ICD-10-CM

## 2010-03-02 ENCOUNTER — Ambulatory Visit: Payer: Self-pay | Admitting: Cardiology

## 2010-03-04 ENCOUNTER — Telehealth: Payer: Self-pay | Admitting: Cardiology

## 2010-03-25 IMAGING — RF DG MYELOGRAM LUMBAR
13 of 17 series · 13 of 17 positions shown · IV contrast (omnipaque)
Comparison: None

CLINICAL DATA: Low back and left-sided pain

 MYELOGRAM INJECTION
TECHNIQUE: Informed consent was obtained from the patient prior to
the procedure, including potential complications of headache,
allergy, infection and pain.  A timeout procedure was performed.
With the patient prone, the lower back was prepped with Betadine.
1% Lidocaine was used for local anesthesia.  Lumbar puncture was
performed at the right to L2-0level using a 22 gauge needle with
return of clear CSF.  15cc of Omnipaque 829was injected into the
subarachnoid space . There is a small amount of subdural contrast.
TECHNIQUE: Following injection of intrathecal Omnipaque contrast,
spine imaging in multiple projections was performed using
fluoroscopy.
Fluoroscopy Time: 2.41 minutes.
TECHNIQUE: CT imaging of the lumbar spine was performed after
intrathecal contrast administration.  Multiplanar CT image
reconstructions were also generated.

[Series 1: (hospital) · 1 of 1 slices shown (1 of 2)]
[im 1/1]
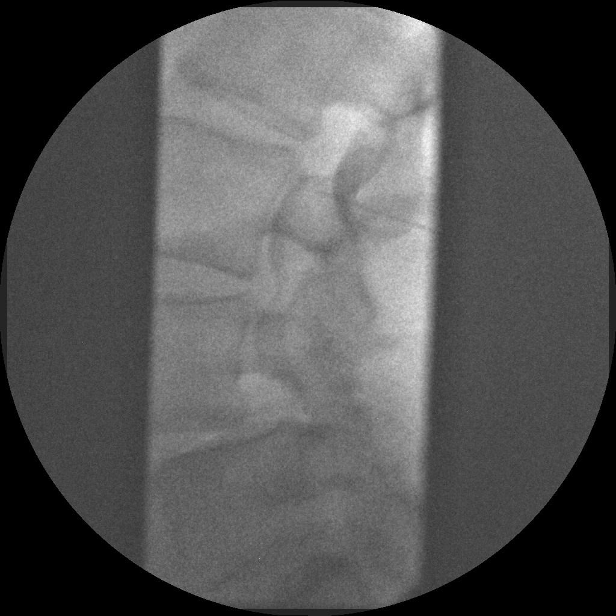

[Series 2: (hospital) · 1 of 1 slices shown (2 of 2)]
[im 1/1]
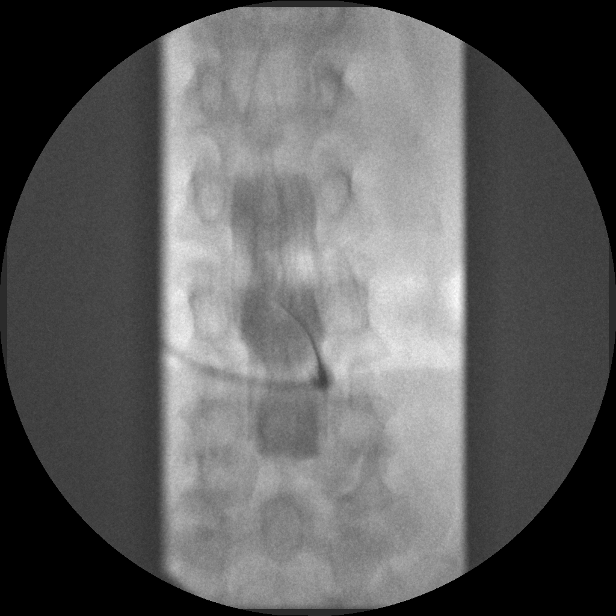

[Series 4: myelogram  white · 1 of 1 slices shown (1 of 11)]
[im 1/1]
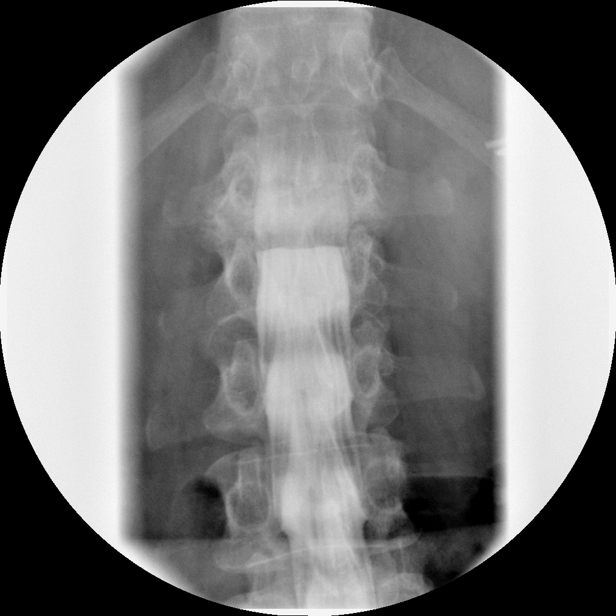

[Series 5: myelogram  white · 1 of 1 slices shown (2 of 11)]
[im 1/1]
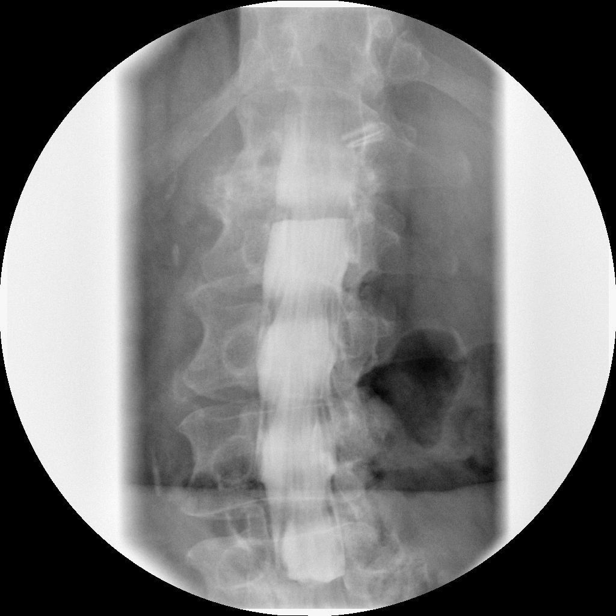

[Series 6: myelogram  white · 1 of 1 slices shown (3 of 11)]
[im 1/1]
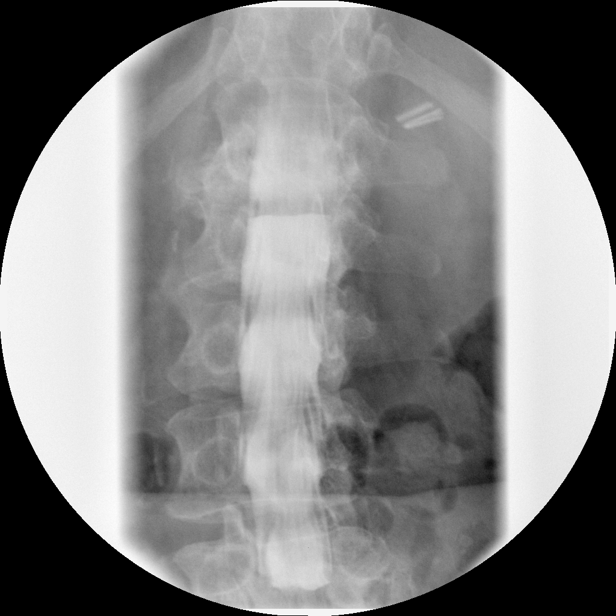

[Series 8: myelogram  white · 1 of 1 slices shown (4 of 11)]
[im 1/1]
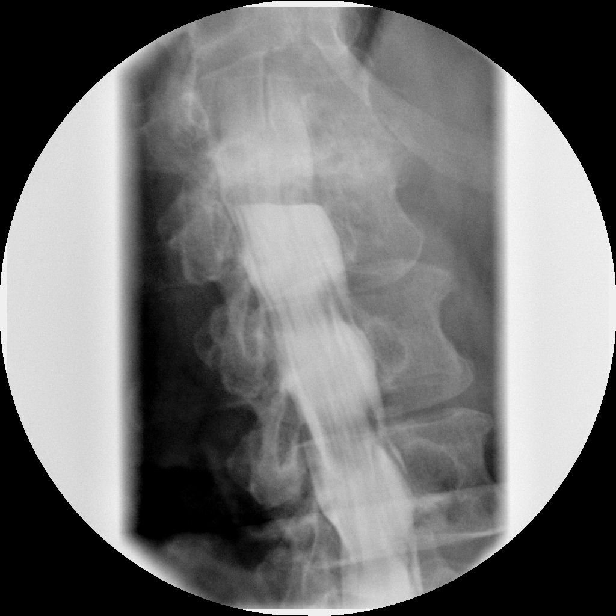

[Series 9: myelogram  white · 1 of 1 slices shown (5 of 11)]
[im 1/1]
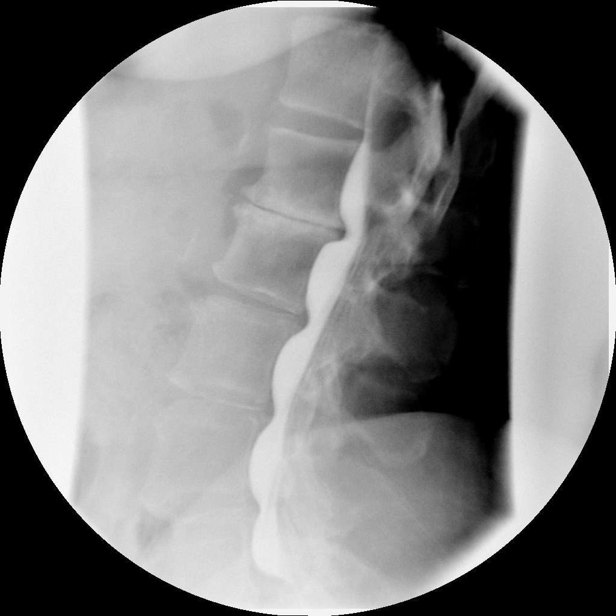

[Series 10: myelogram  white · 1 of 1 slices shown (6 of 11)]
[im 1/1]
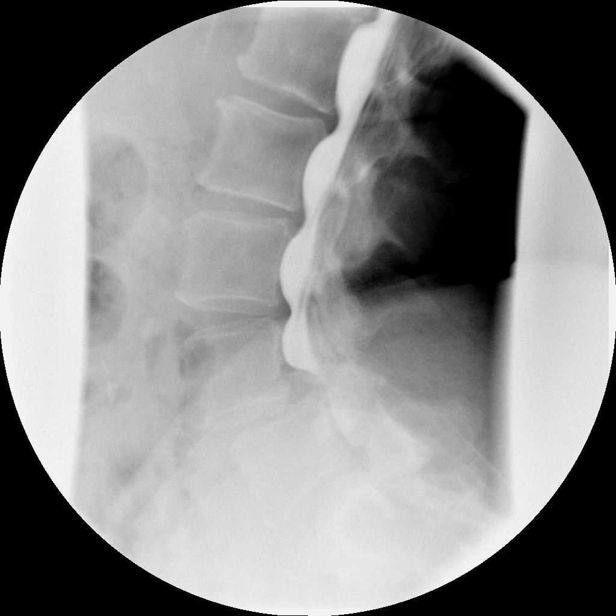

[Series 12: myelogram  white · 1 of 1 slices shown (7 of 11)]
[im 1/1]
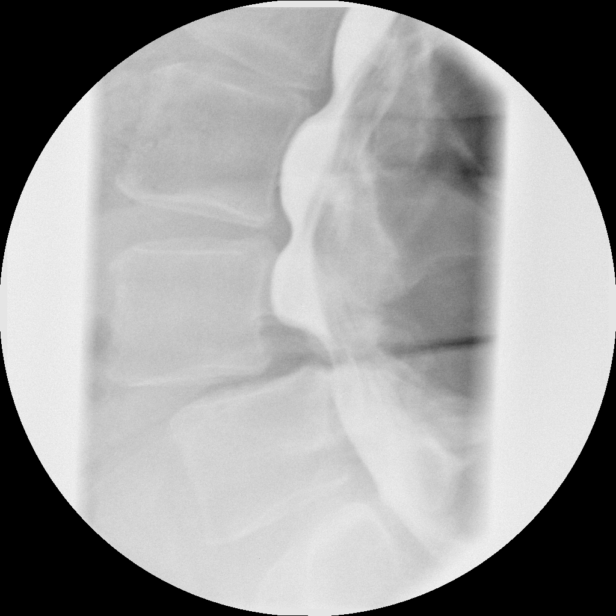

[Series 13: myelogram  white · 1 of 1 slices shown (8 of 11)]
[im 1/1]
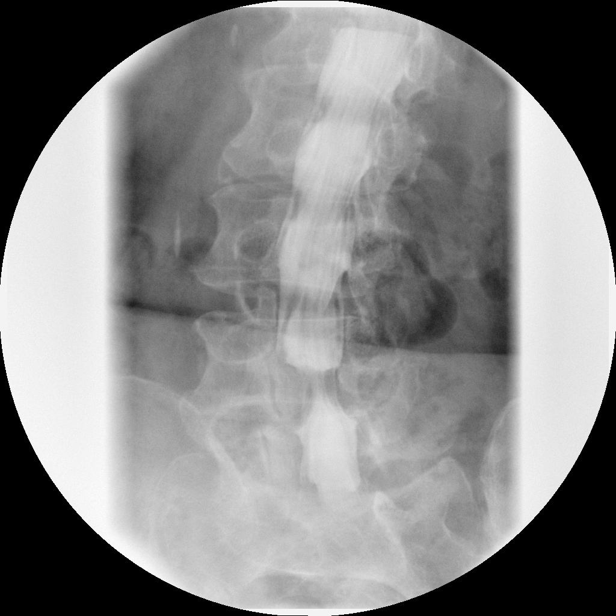

[Series 14: myelogram  white · 1 of 1 slices shown (9 of 11)]
[im 1/1]
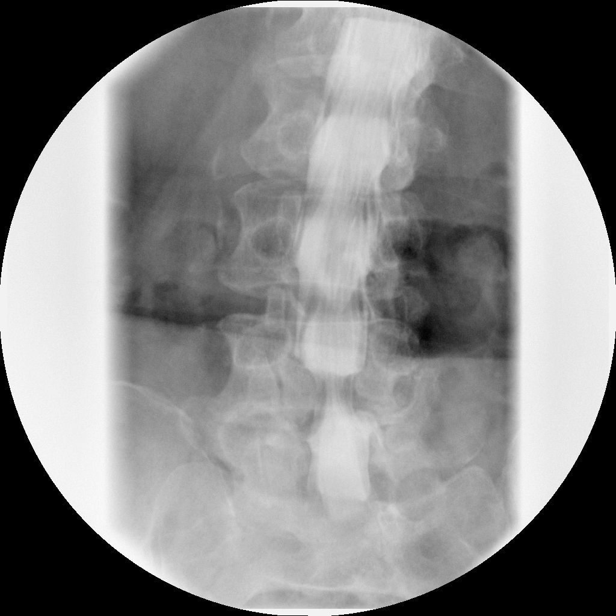

[Series 16: myelogram  white · 1 of 1 slices shown (10 of 11)]
[im 1/1]
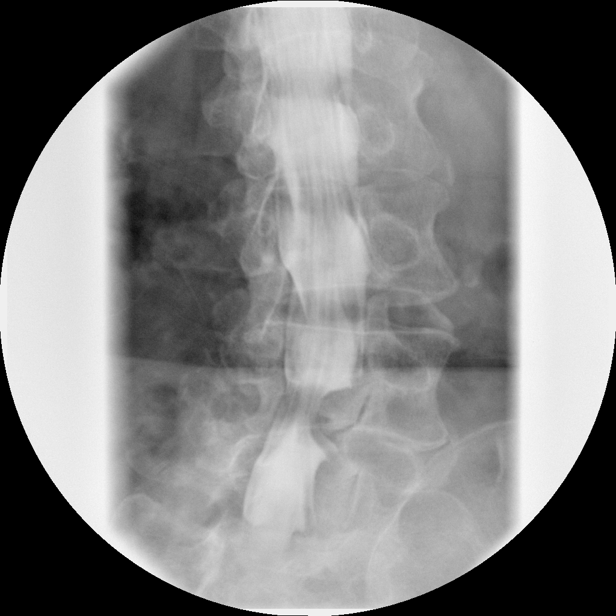

[Series 17: myelogram  white · 1 of 1 slices shown (11 of 11)]
[im 1/1]
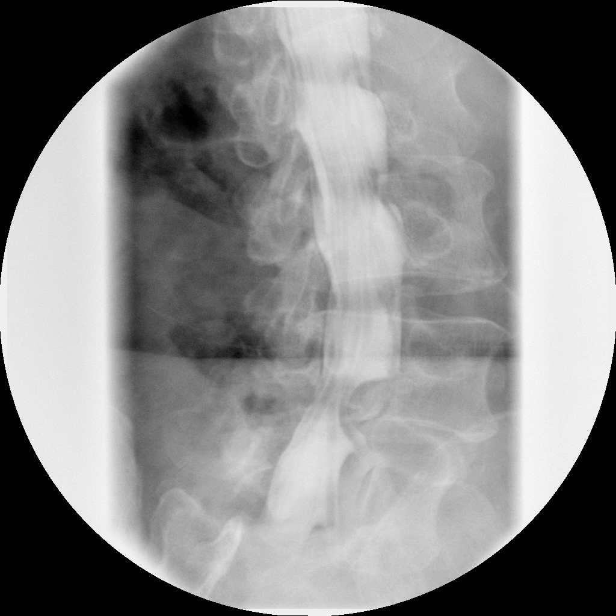

[13 of 17 positions shown; findings below may reference images not displayed]

IMPRESSION: Successful injection of  intrathecal contrast for myelography.

MYELOGRAM LUMBAR
FINDINGS: There is degenerative disc disease at T12-L1 with
posterior osteophytes that indent the thecal sac.  There is mild
spondylosis at L1-2, L2-3 and L3-4 but no compressive stenosis.

At L4-5, there is anterolisthesis of 1.5 cm.  There is stenosis at
this level that could cause neural compression on either side.

No compressive pathology is evident at L5-S1.
IMPRESSION: 1.5 cm anterolisthesis at L4-5 with spinal stenosis that could
cause neural compression on either side.

CT MYELOGRAPHY LUMBAR SPINE
FINDINGS: T12-L1:  Advanced chronic degenerative disc disease with
complete loss of disc height.  Endplate osteophytes and bulging
disc material indent the ventral subarachnoid space and narrow the
lateral recesses mildly but do not appear to cause definite neural
compression.

L1-2:  Circumferential disc bulge.  Mild narrowing of the lateral
recesses.  No definite neural compression.

L2-3:  Circumferential disc bulge.  Mild narrowing of the lateral
recesses.  No compressive pathology.

L3-4:  Circumferential disc bulge.  Mild facet and ligamentous
hypertrophy.  Mild narrowing of the lateral recesses and foramina
without definite neural compression.

L4-5:  Advanced facet arthropathy bilaterally with anterolisthesis
of 1 cm in this position.  Circumferential protrusion of disc
material.  Previous posterior decompression.  Stenosis of the
lateral recesses that could compress either L5 nerve root.  Neural
foraminal stenosis bilaterally that could compress either L4 nerve
root. Foraminal disease seems more severe on the left than the
right.

L5-S1:  Mild facet degeneration but no slippage.  Disc bulge but no
compressive stenosis.
IMPRESSION: Previous posterior decompression at L4-5.  Advanced facet
arthropathy with anterolisthesis of 1 cm in this position.  Broad-
based herniation of disc material.  Stenosis of both lateral
recesses of both neural foramina that could compress the L4 and/or
L5 nerve roots.

Chronic disc degeneration at T12-L1 with osteophytes and bulging
disc material that indent the subarachnoid space but do not cause
definite neural compression.

Disc bulges at L1-2 and L2-3 without apparent neural compression.

Disc bulge at L3-4.  Facet and ligamentous hypertrophy.  Mild
lateral recess narrowing without definite neural compression.
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
## 2010-04-07 ENCOUNTER — Inpatient Hospital Stay (HOSPITAL_COMMUNITY): Admission: RE | Admit: 2010-04-07 | Discharge: 2010-04-10 | Payer: Self-pay | Admitting: Neurological Surgery
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
## 2010-04-14 IMAGING — RF DG LUMBAR SPINE 2-3V
1 series · 2 of 2 positions shown · non-contrast
Comparison: No prior plain films for comparison.  There is a CT
scan dated 09/26/2008.

CLINICAL DATA: Status post L4-5 PLIF

LUMBAR SPINE - 2-3 VIEW

[Series 1: run · 2 of 2 slices shown]
[im 1/2]
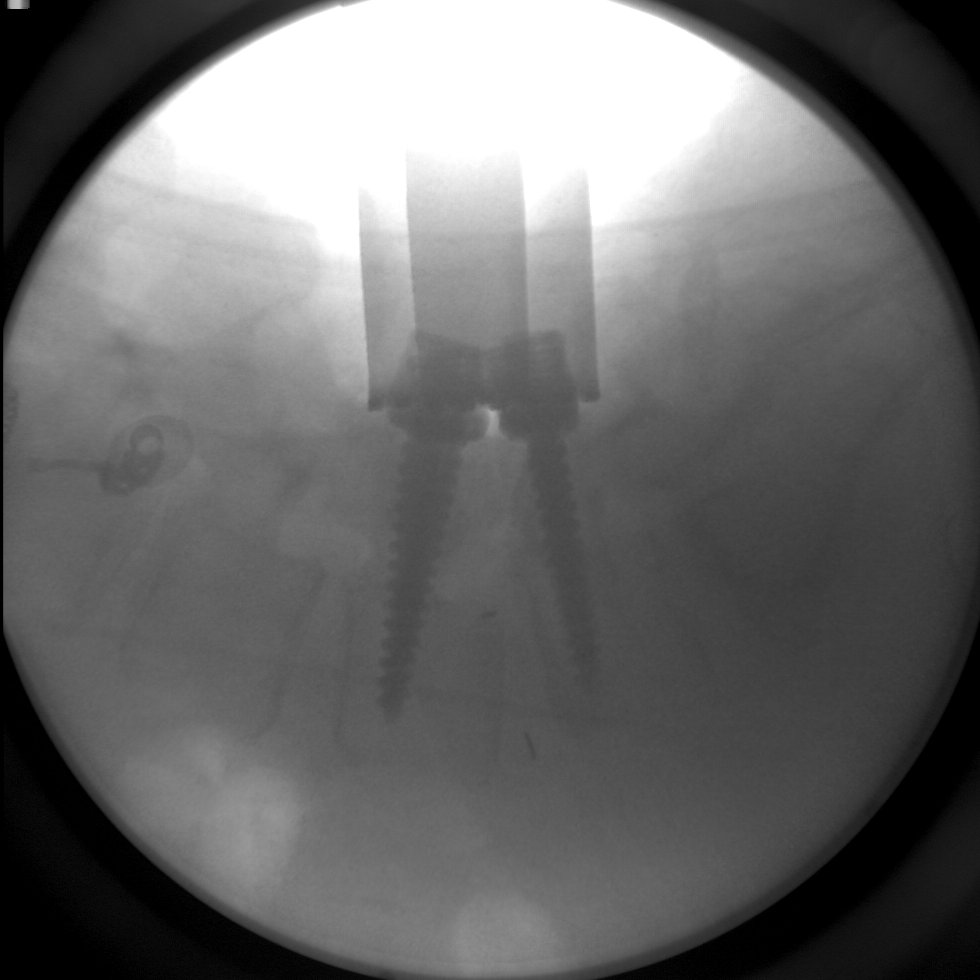
[im 2/2]
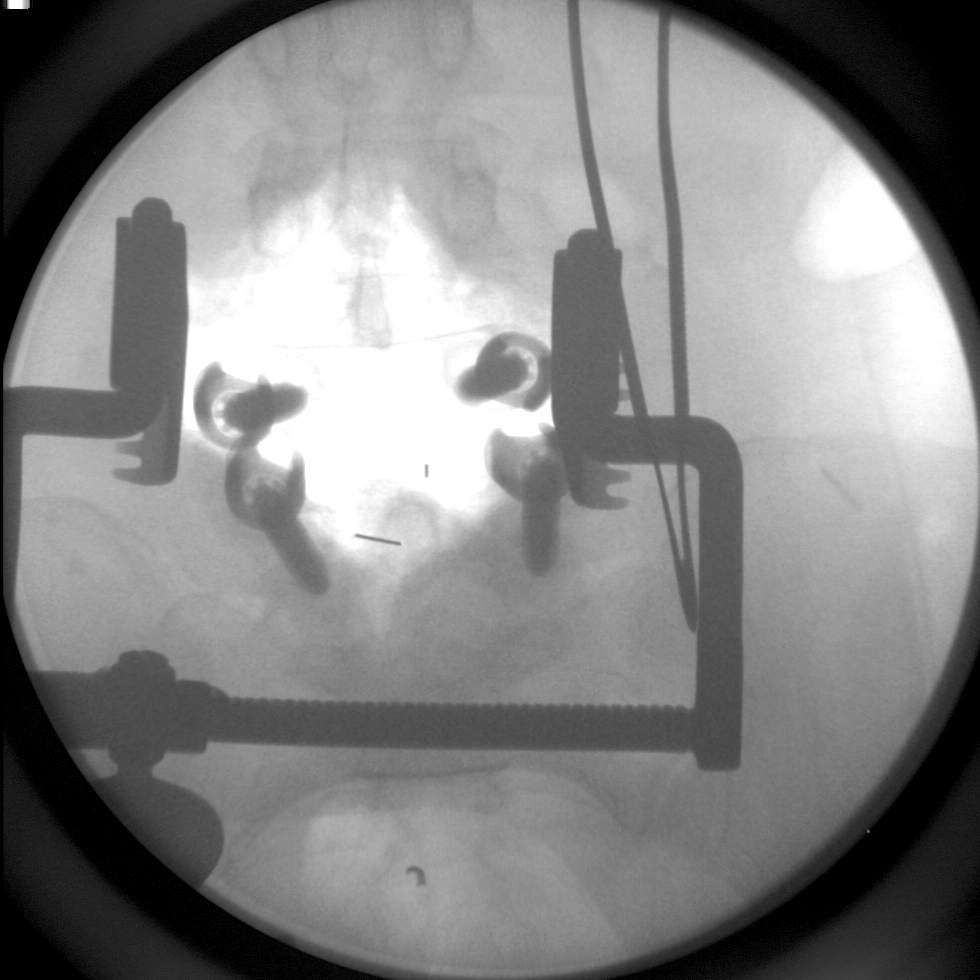

[2 of 2 positions shown; findings below may reference images not displayed]

FINDINGS: Status post L4-5 PLIF with pedicle screws in place.
There is a grade 1 anterolisthesis of L4 on L5, similar to the
preoperative CT.
IMPRESSION: Status post L4-5 PLIF as described above.

## 2010-04-22 IMAGING — CT CT ABDOMEN W/O CM
1 of 3 series · 13 of 32 positions shown, 18 images · non-contrast
Comparison: None

CLINICAL DATA: Constipation

CT ABDOMEN WITHOUT CONTRAST,CT PELVIS WITHOUT CONTRAST
TECHNIQUE: Multidetector CT imaging of the abdomen was performed
following the standard protocol without IV contrast.,Technique:
Multidetector CT imaging of the pelvis was performed following the
standard protocol without intravenous contrast.

[Series 2: abd_pel 5.0 b40f · axial · 0.86mm/px · z∈[+541,+906]mm · 13 of 85 slices shown, 18 images]
[im 6/85  soft-tissue]
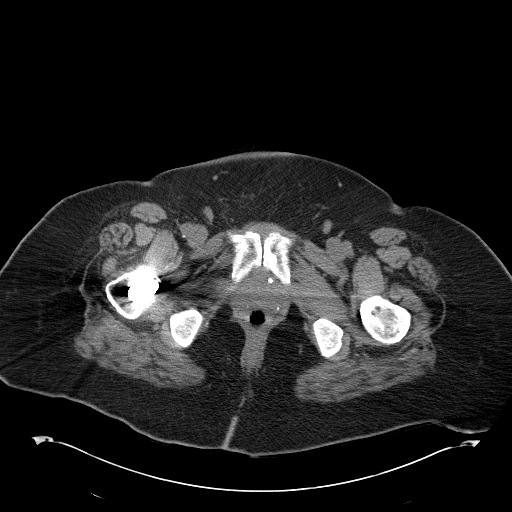
[im 6/85  bone]
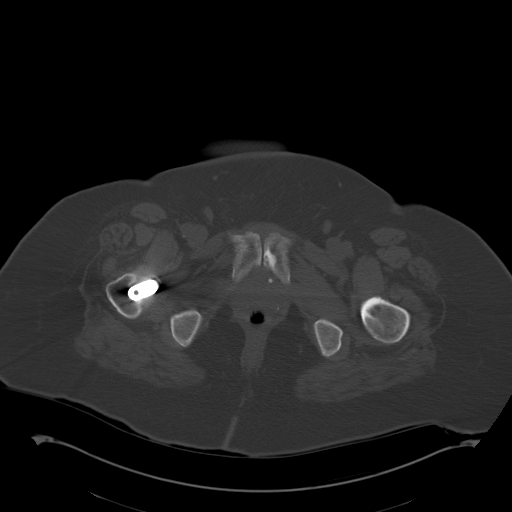
[im 12/85  soft-tissue]
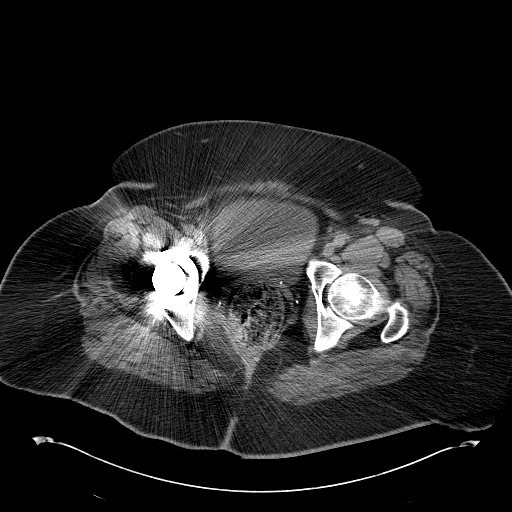
[im 17/85  soft-tissue]
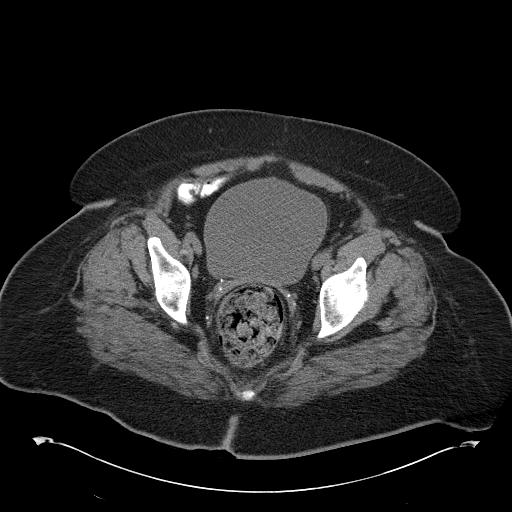
[im 29/85  soft-tissue]
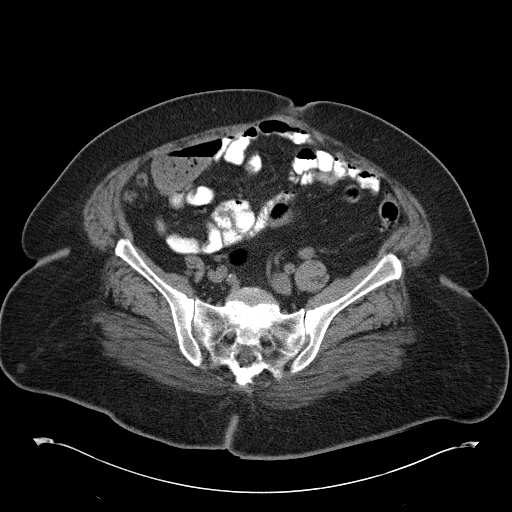
[im 34/85  soft-tissue]
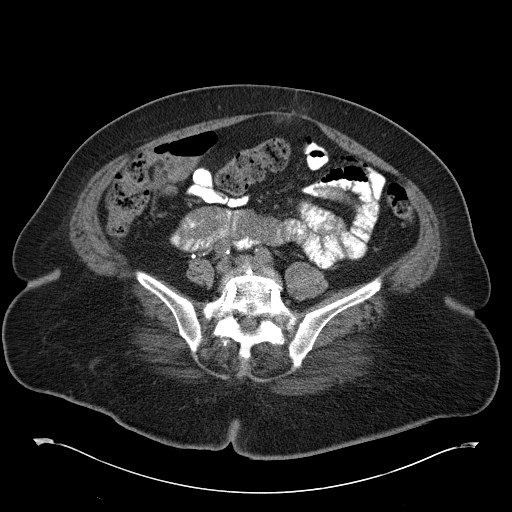
[im 40/85  soft-tissue]
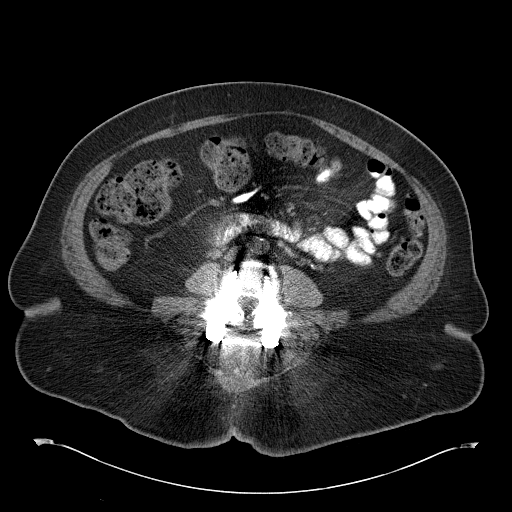
[im 45/85  soft-tissue]
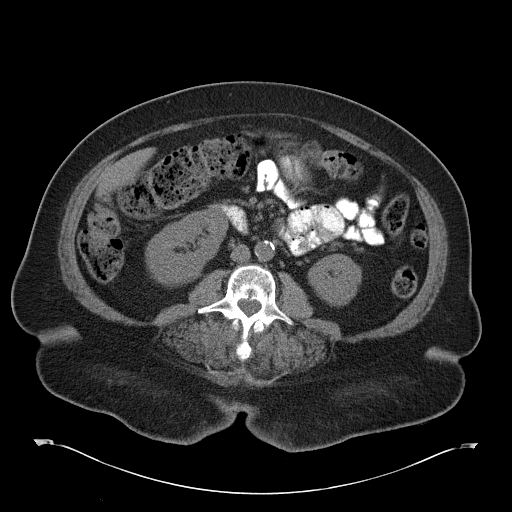
[im 51/85  soft-tissue]
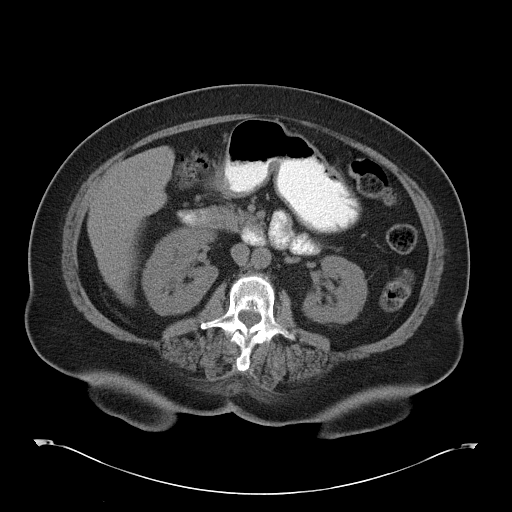
[im 57/85  soft-tissue]
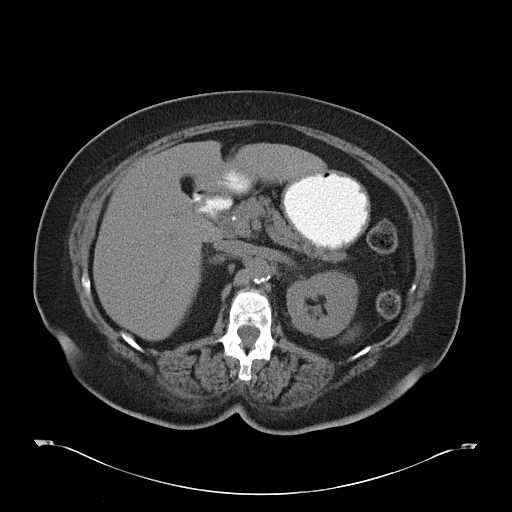
[im 57/85  bone]
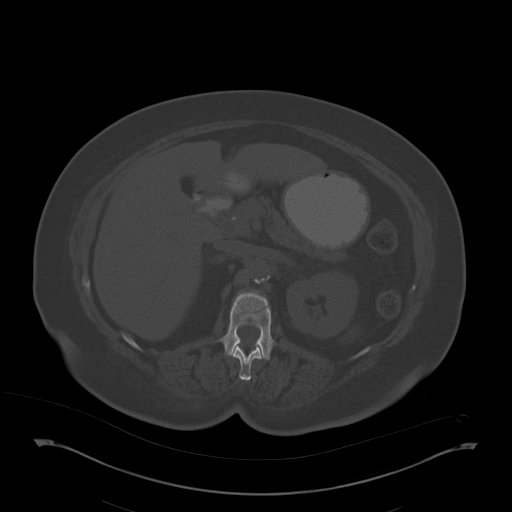
[im 62/85  lung]
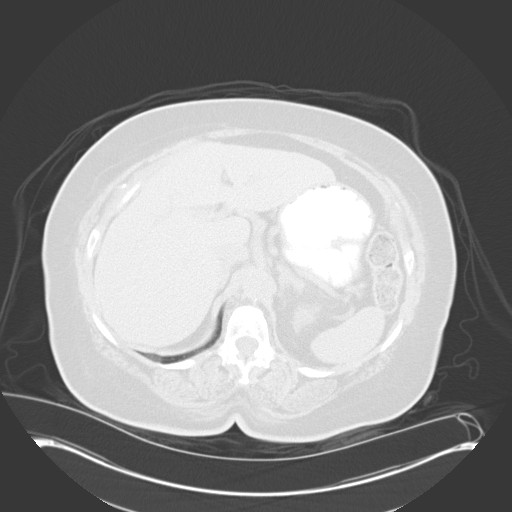
[im 68/85  soft-tissue]
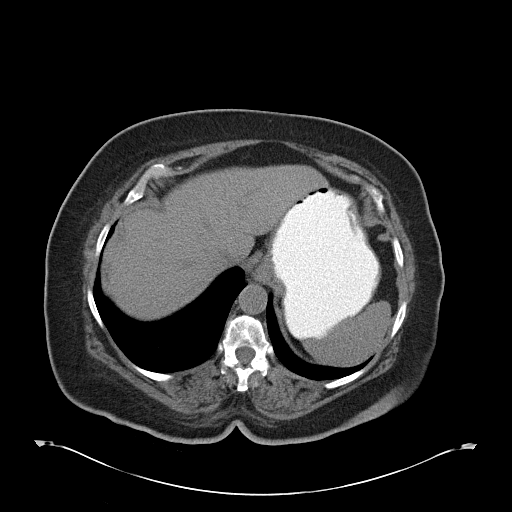
[im 68/85  lung]
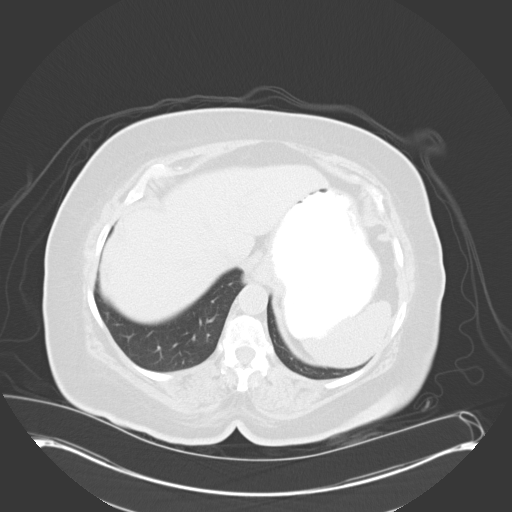
[im 73/85  soft-tissue]
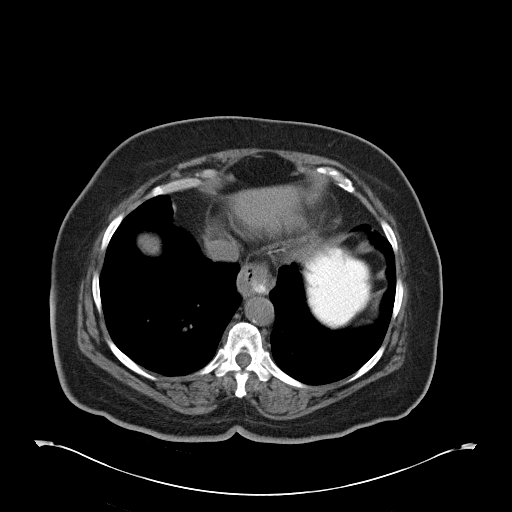
[im 73/85  lung]
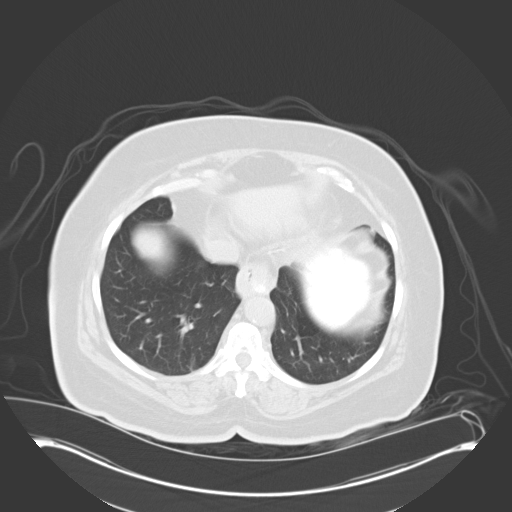
[im 79/85  soft-tissue]
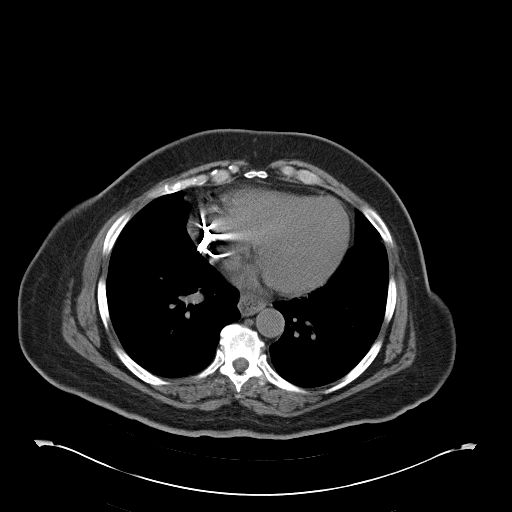
[im 79/85  lung]
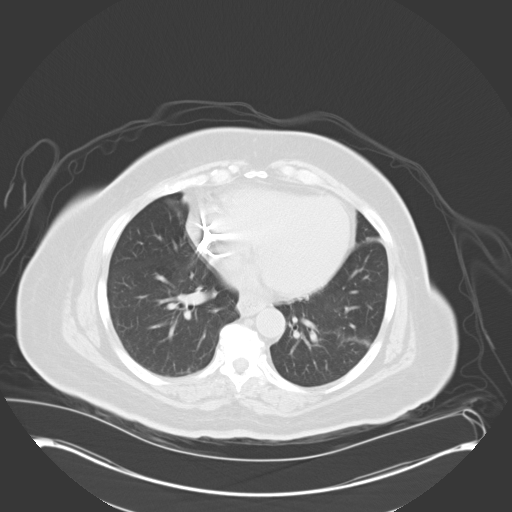

[13 of 32 positions shown; findings below may reference images not displayed]

FINDINGS: Borderline cardiomegaly noted.  Cardiac pacemaker leads
are noted.  Lung bases are unremarkable.  A small hiatal hernia is
noted.  The study is limited without IV contrast.  Only oral
contrast was given to the patient The patient is status post
cholecystectomy.  No intrahepatic biliary ductal dilatation.
Unenhanced liver, spleen, pancreas and right adrenal are
unremarkable.  Mild nonspecific thickening of the left adrenal
noted.  Unenhanced kidney are symmetrical in size without evidence
of hydronephrosis or nephrolithiasis.  The right kidney is mild
malrotated.  Stool noted throughout the colon.  A small umbilical
hernia is noted containing omental fat without evidence of acute
complication.  Mild gastric distention is noted with oral contrast
material without evidence of gastric outlet obstruction.  No
thickened or dilated bowel loops are noted.  Mild atherosclerotic
calcifications of the abdominal aorta and the iliac arteries are
noted.  No bowel obstruction.  No ascites or free air.  No
adenopathy.  No destructive bony lesions are noted.  Mild
degenerative changes lumbar spine are noted.
IMPRESSION: 1.  There is mild gastric distention with oral contrast without
evidence of gastric outlet obstruction.
2.  Status post cholecystectomy.
3.  Small hiatal hernia is noted.
4.  No bowel obstruction.  No hydronephrosis.
5. Small umbilical hernia containing fat without evidence of acute
complication.

CT pelvis:

Metallic fixation material noted lumbar spine at the L4-L5 level.
Significant disc flattening with endplate sclerotic changes and
mild anterior and mild posterior spurring noted at T12-L1 level.

The study is limited by metallic artifacts from right hip
prosthesis.  There is no small bowel obstruction.  The terminal
ileum is unremarkable.  Stool noted within cecum.  Large amount of
stool noted within the rectum which measures 7.5 cm in diameter.
Mild fecal impaction is suspected.  The sigmoid colon is empty
collapsed.  Sigmoid colon diverticula are seen without evidence of
acute diverticulitis.  The uterus and adnexa are not identified.

The urinary bladder is unremarkable.  No pelvic ascites or
adenopathy.  No inguinal adenopathy.
IMPRESSION: 1.  There are metallic artifacts from right hip prosthesis.
2.  Stool noted within rectum.  Mild fecal impaction is suspected.
3.  Unremarkable urinary bladder.  No pelvic ascites or adenopathy.

## 2010-04-22 IMAGING — CR DG CHEST 2V
2 series · 2 of 2 positions shown · non-contrast
Comparison: 10/10/2008

CLINICAL DATA: Vomiting, diarrhea

CHEST - 2 VIEW

[view not recorded (1 of 2)]
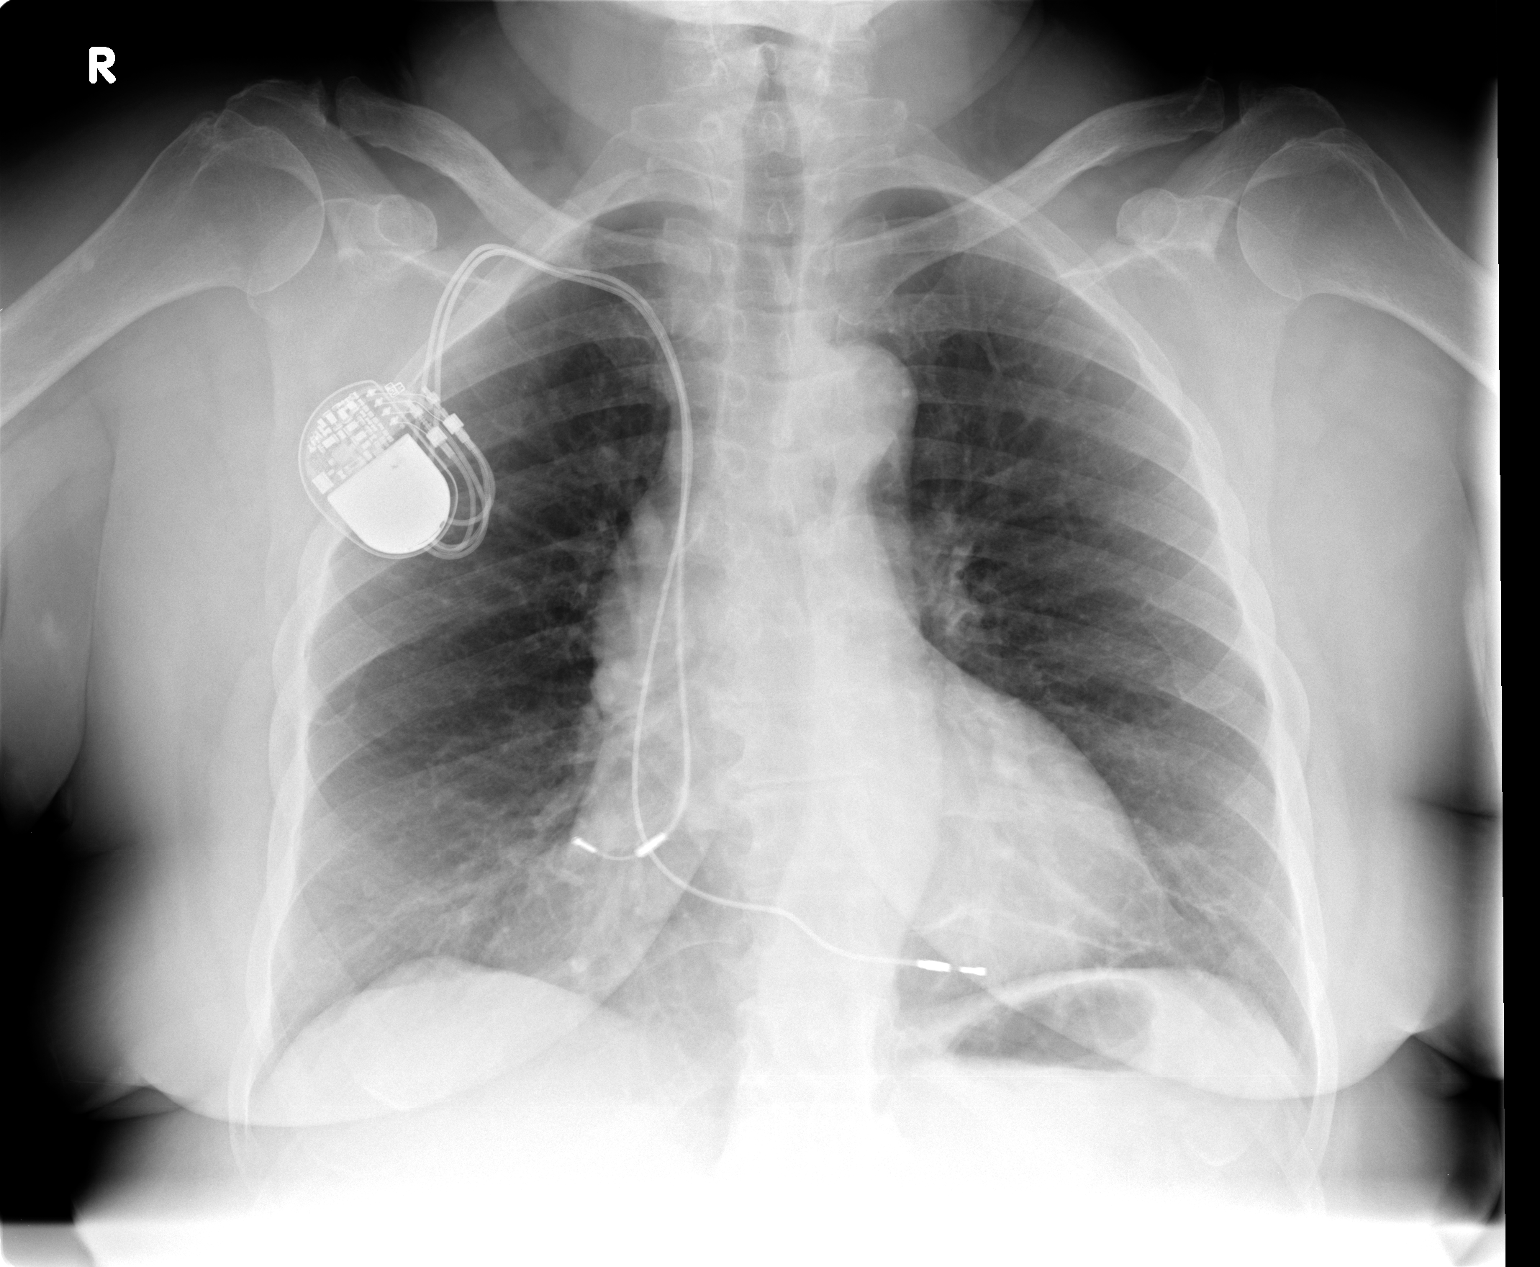

[view not recorded (2 of 2)]
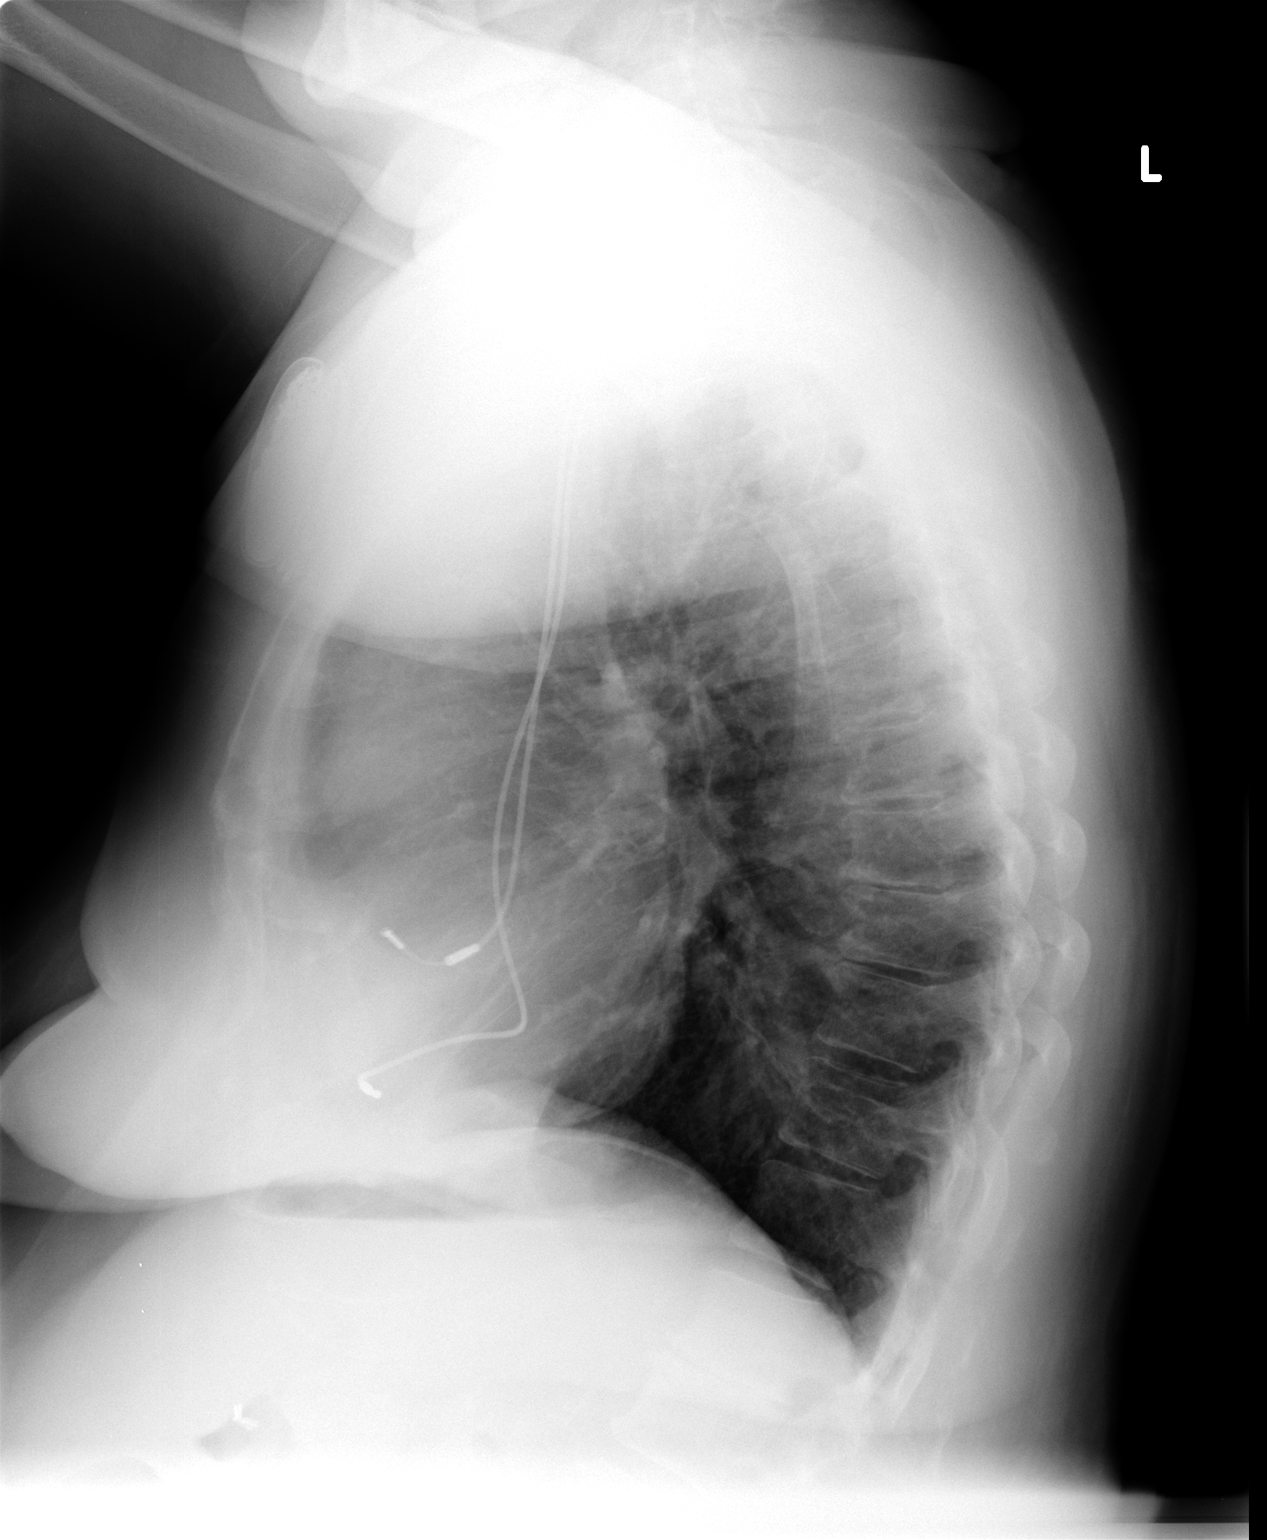

[2 of 2 positions shown; findings below may reference images not displayed]

FINDINGS: Cardiomegaly again noted.  No acute infiltrate or edema.
Stable dual lead cardiac pacemaker position.  Bony thorax is
stable.  Mild degenerative changes of thoracic spine again noted.
IMPRESSION: No active disease.  Stable borderline cardiomegaly.

## 2010-04-29 ENCOUNTER — Emergency Department (HOSPITAL_COMMUNITY): Admission: EM | Admit: 2010-04-29 | Discharge: 2010-04-29 | Payer: Self-pay | Admitting: Emergency Medicine

## 2010-05-06 IMAGING — CR DG CHEST 1V PORT
1 series · 1 of 1 positions shown · non-contrast
Comparison: 10/24/2008.

CLINICAL DATA: Central line placement.

PORTABLE CHEST - 1 VIEW

[view not recorded]
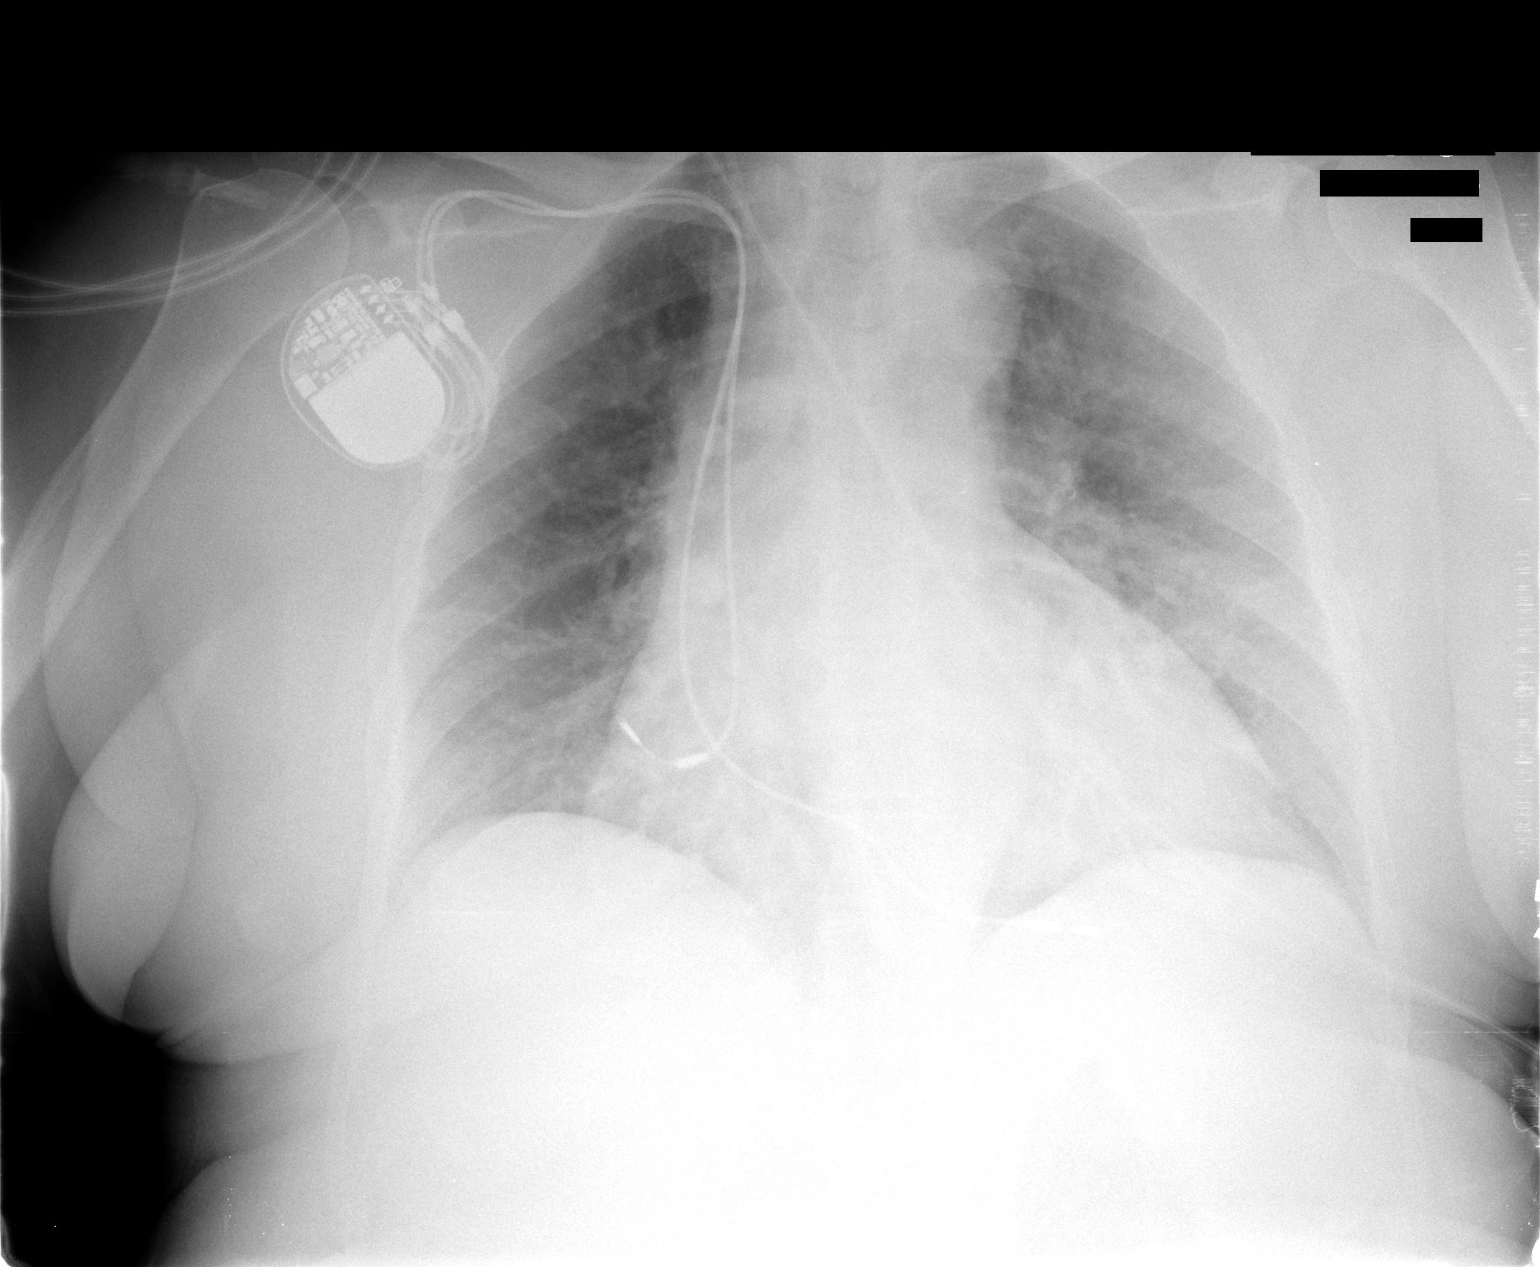

[1 of 1 positions shown; findings below may reference images not displayed]

FINDINGS: 6166 hours.  There is a new right internal jugular
central venous catheter.  This catheter overlaps the pacemaker
leads in the superior vena cava and its tip is not well visualized.
The pacemaker leads appear stable.  There is no pneumothorax.
Cardiomegaly and vascular congestion remain.
IMPRESSION: 1.  The tip of the right internal jugular central venous catheter
is not well visualized due to overlap with the patient's pacemaker
leads. There is no pneumothorax.
2.  Stable cardiomegaly and vascular congestion.

## 2010-05-17 ENCOUNTER — Encounter: Admission: RE | Admit: 2010-05-17 | Discharge: 2010-05-17 | Payer: Self-pay | Admitting: Neurological Surgery

## 2010-06-06 IMAGING — CR DG LUMBAR SPINE 2-3V
3 series · 3 of 3 positions shown · non-contrast
Comparison: CT 10/24/2008 and lumbar spine intraoperative films
10/16/2008

CLINICAL DATA: Low back pain.  Surgery October 2008.

LUMBAR SPINE - 2-3 VIEW

[t l-spine a.p.]
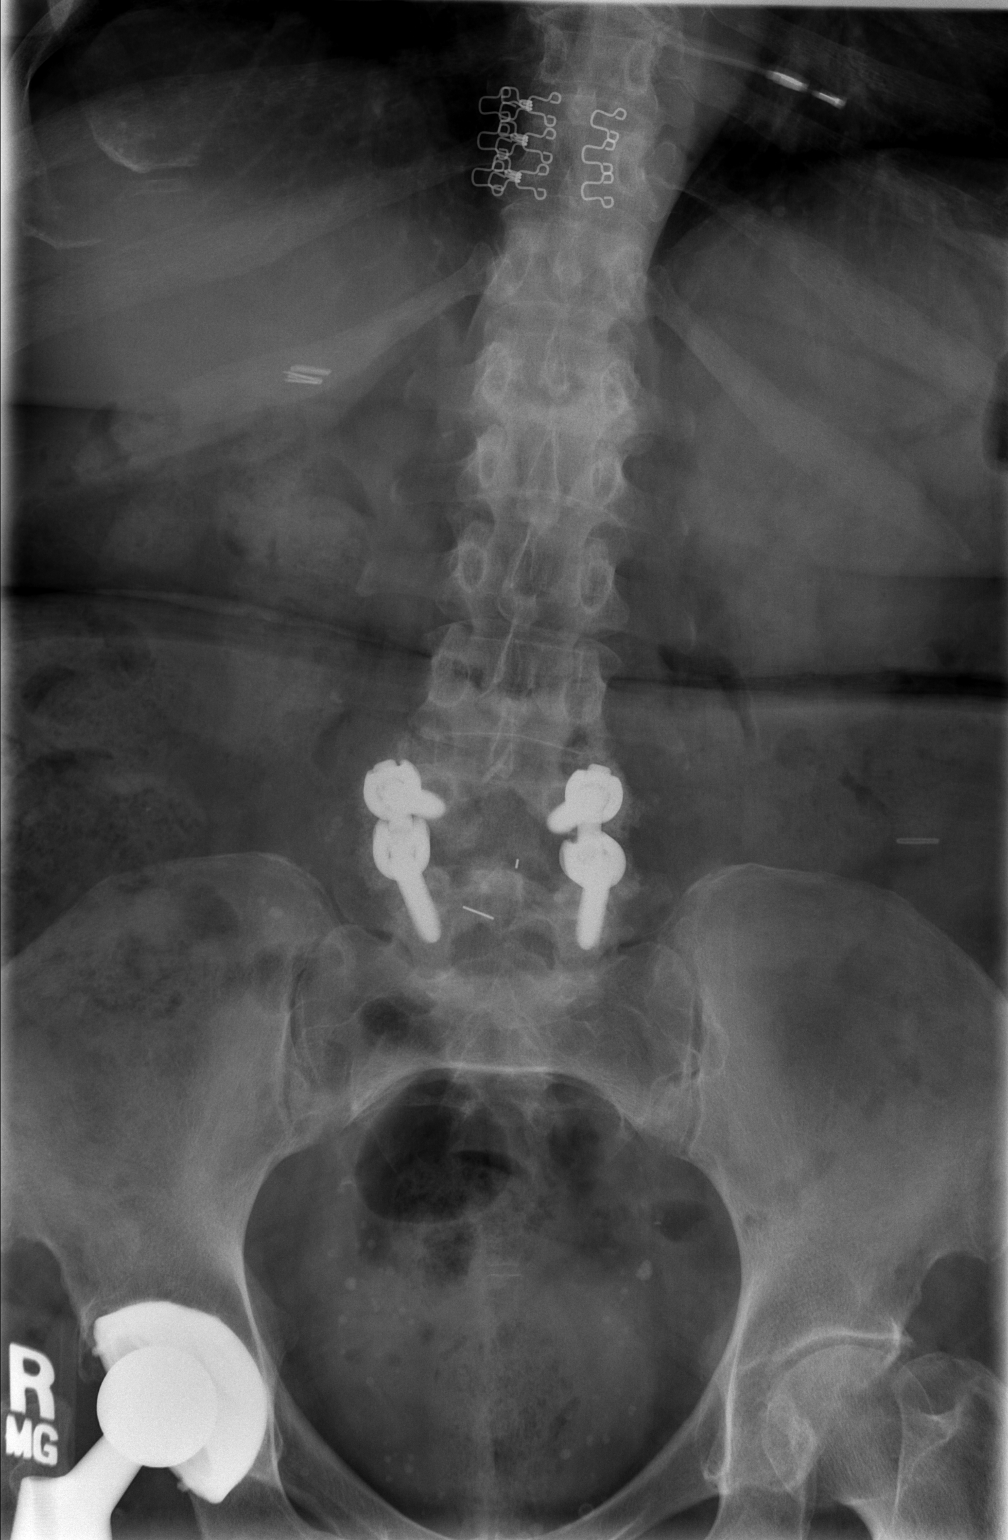

[t l-spine lat]
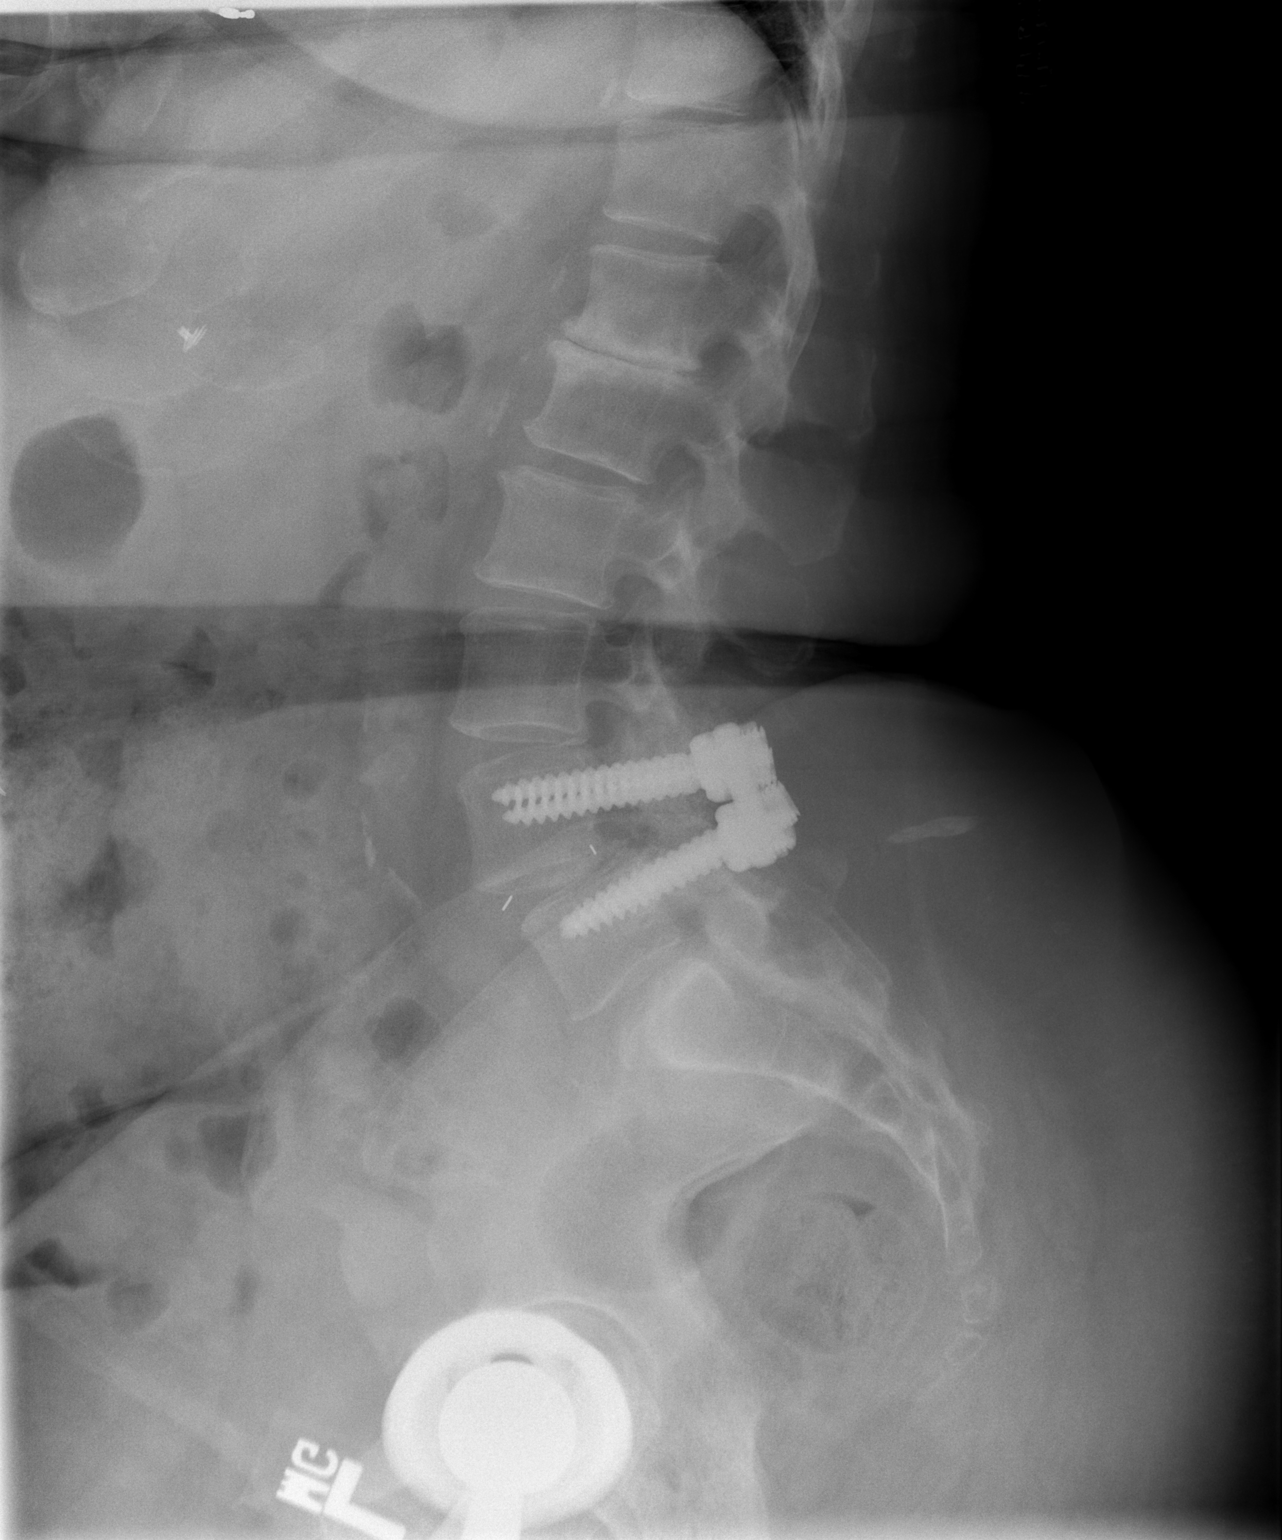

[t l-spine l5-s1 spot]
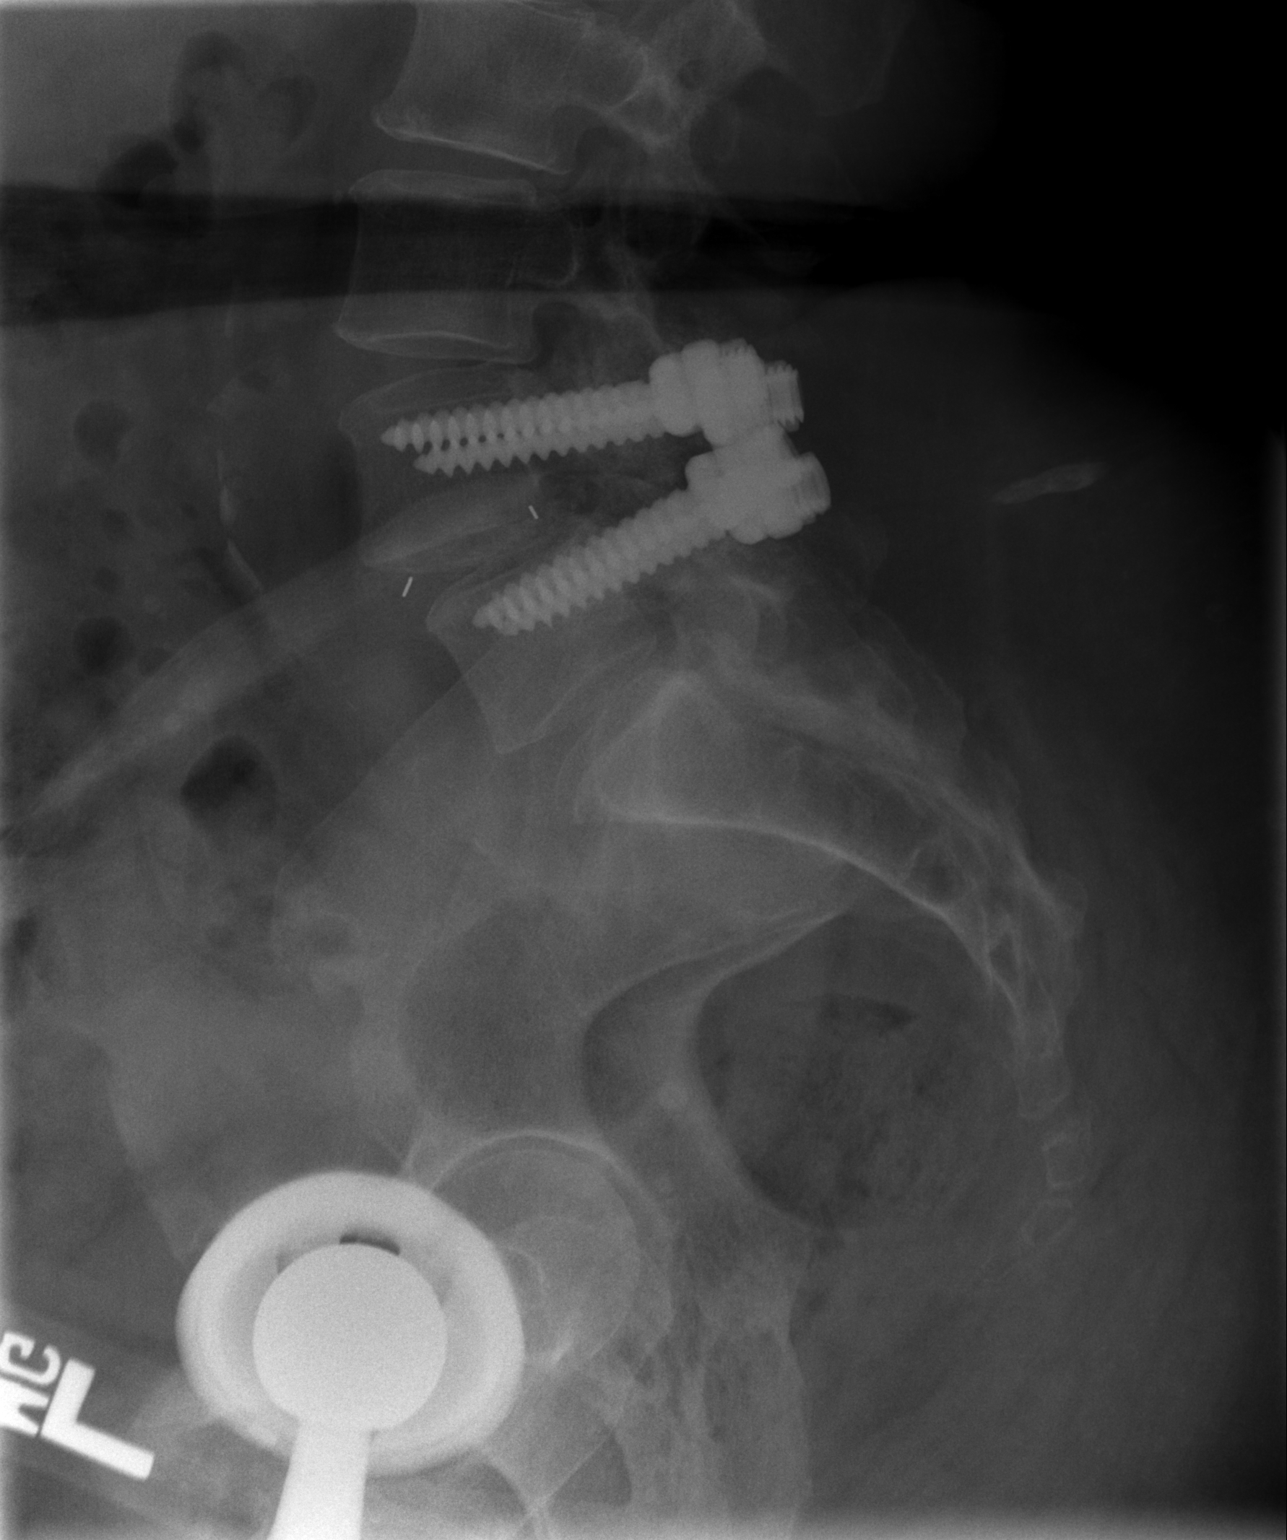

[3 of 3 positions shown; findings below may reference images not displayed]

FINDINGS: The patient is status post L4-5 posterior lumbar
interbody fusion.  Grade 1 anterolisthesis of L4-L5 is unchanged
from 10/24/2008.  Alignment is otherwise anatomic.  Endplate
degenerative changes and loss of disc space height are worst at T12-
L1.  Atherosclerotic calcification of the arterial vasculature.
Right total hip arthroplasty.  Surgical clips are seen scattered in
the abdomen.  Numerous phleboliths are seen in the anatomic pelvis.
IMPRESSION: 1.  L4-5 PLIF without complicating feature.
2.  Spondylosis is worst at T12-L1.

## 2010-07-19 ENCOUNTER — Encounter: Admission: RE | Admit: 2010-07-19 | Discharge: 2010-07-19 | Payer: Self-pay | Admitting: Neurological Surgery

## 2010-07-22 ENCOUNTER — Ambulatory Visit: Payer: Self-pay | Admitting: Cardiology

## 2010-07-23 ENCOUNTER — Ambulatory Visit (HOSPITAL_COMMUNITY): Admission: RE | Admit: 2010-07-23 | Discharge: 2010-07-23 | Payer: Self-pay | Admitting: Neurological Surgery

## 2010-08-02 IMAGING — CR DG LUMBAR SPINE 2-3V
3 series · 3 of 3 positions shown · non-contrast
Comparison: [HOSPITAL] lumbar spine radiographs 10/16/2008
and [HOSPITAL] at [REDACTED] [HOSPITAL] lumbar spine radiographs
[DATE] and [HOSPITAL] abdominal pelvic CT 10/24/2008.

CLINICAL DATA: Lumbar spine surgery 10/16/2008.  Low back pain.

LUMBAR SPINE - 2-3 VIEW

[view not recorded (1 of 3)]
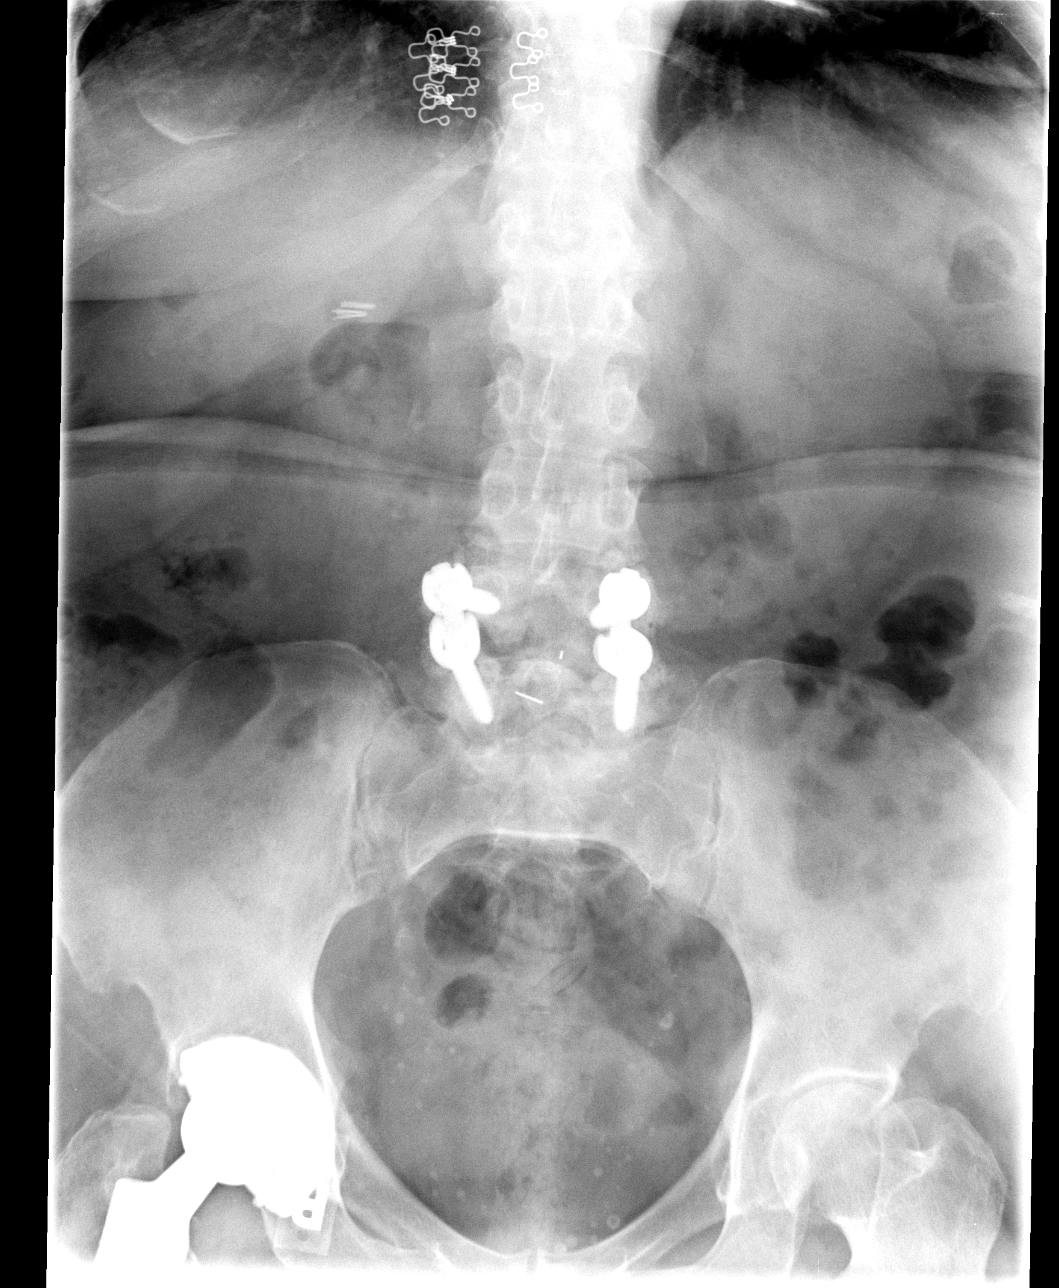

[view not recorded (2 of 3)]
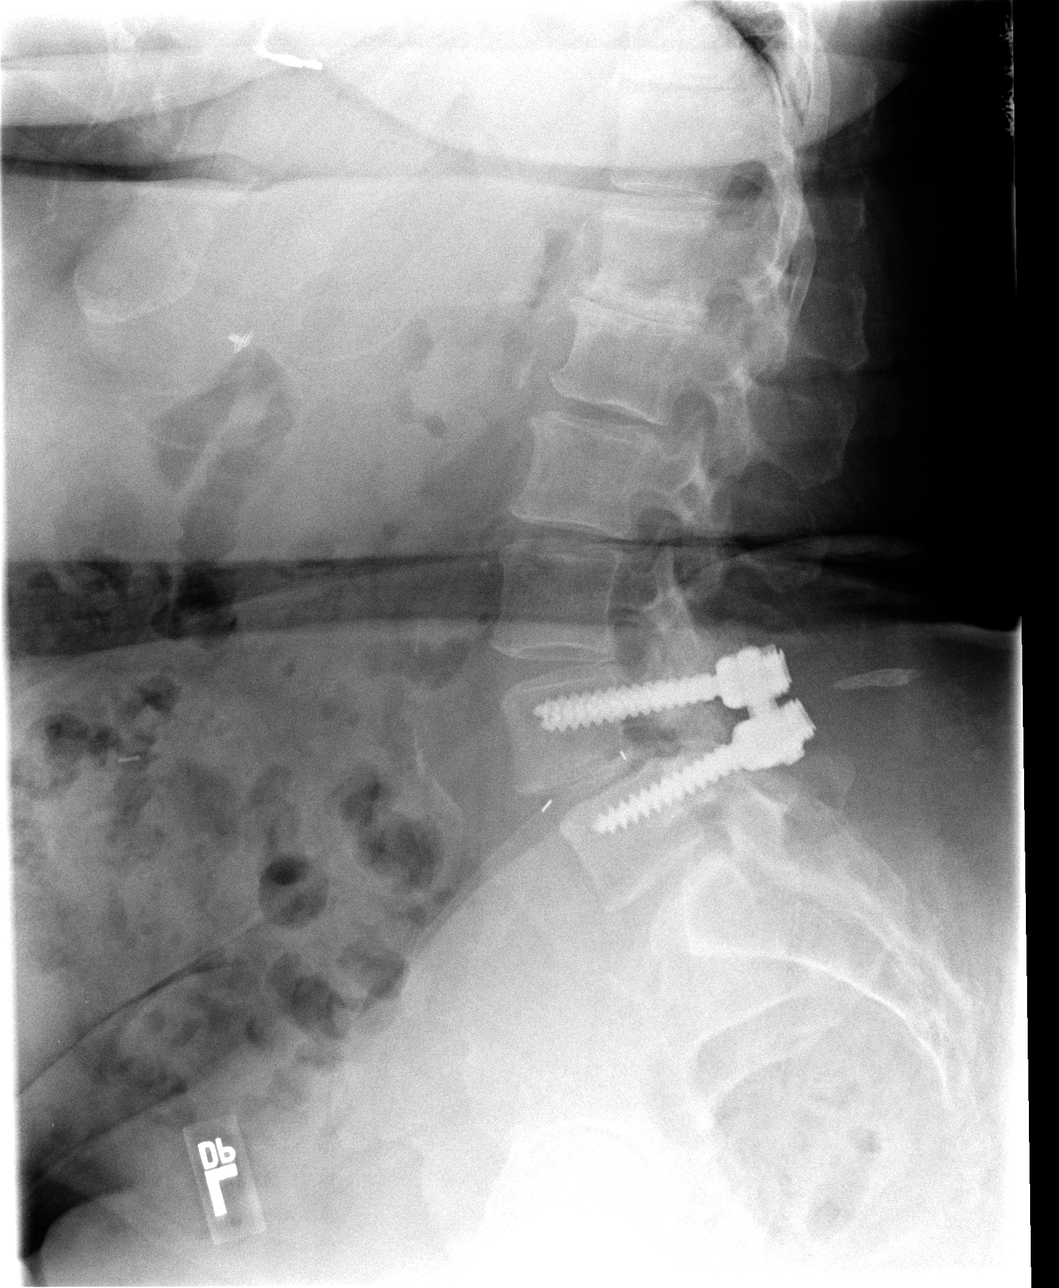

[view not recorded (3 of 3)]
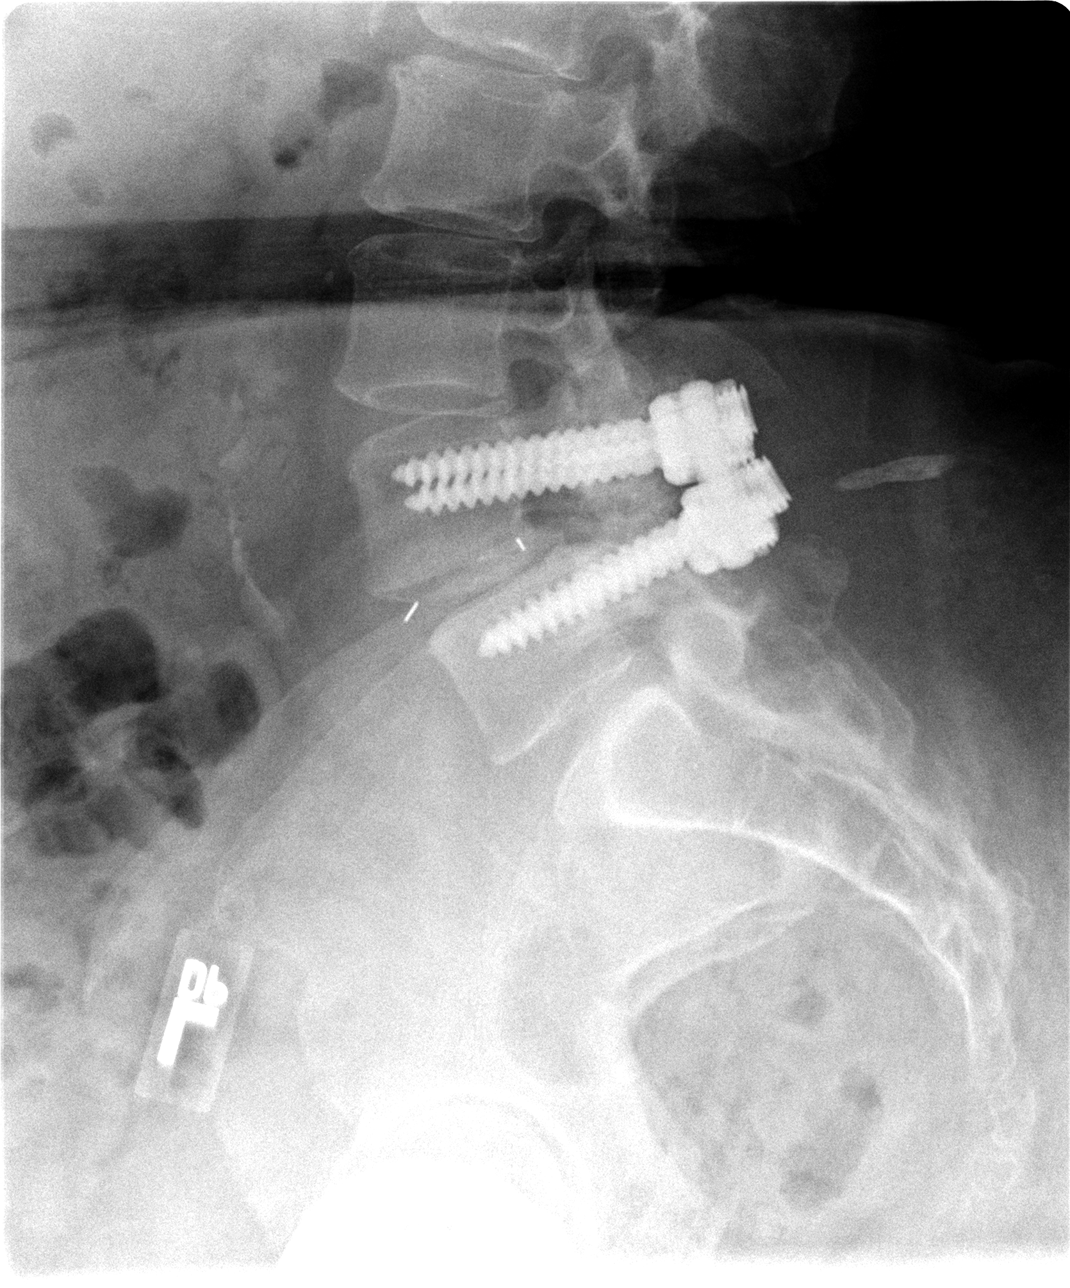

[3 of 3 positions shown; findings below may reference images not displayed]

FINDINGS: Stable posterior laminectomy fusion hardware/posterior
osseous fusion and interbody bone plugs L4-5 seen.  No change in 7
mm anterolisthesis L4-5.  Multilevel degenerative disc disease is
advanced at T12-L1, slight to moderate at L1-2, lesser at L2-3, L3-
4, and L5-S1.  Remaining vertebral alignment is normally
maintained.  Slight atheromatous vascular calcification of
abdominal aorta unchanged.
IMPRESSION: 1.  Stable posterior laminectomy fusion L4-5 and 7 mm
anterolisthesis.
2.  Stable multilevel degenerative disc disease most advanced at
T12-L1.
3.  No new acute findings.

## 2010-08-27 ENCOUNTER — Ambulatory Visit (HOSPITAL_COMMUNITY): Admission: RE | Admit: 2010-08-27 | Discharge: 2010-08-27 | Payer: Self-pay | Admitting: Pulmonary Disease

## 2010-09-16 ENCOUNTER — Telehealth: Payer: Self-pay | Admitting: Cardiology

## 2010-09-29 ENCOUNTER — Ambulatory Visit: Payer: Self-pay | Admitting: Internal Medicine

## 2010-09-29 ENCOUNTER — Telehealth (INDEPENDENT_AMBULATORY_CARE_PROVIDER_SITE_OTHER): Payer: Self-pay | Admitting: *Deleted

## 2010-10-01 ENCOUNTER — Ambulatory Visit: Payer: Self-pay | Admitting: Cardiology

## 2010-10-01 ENCOUNTER — Encounter (INDEPENDENT_AMBULATORY_CARE_PROVIDER_SITE_OTHER): Payer: Self-pay | Admitting: *Deleted

## 2010-10-01 ENCOUNTER — Ambulatory Visit (HOSPITAL_COMMUNITY)
Admission: RE | Admit: 2010-10-01 | Discharge: 2010-10-01 | Payer: Self-pay | Source: Home / Self Care | Admitting: Cardiology

## 2010-10-01 ENCOUNTER — Encounter: Payer: Self-pay | Admitting: Adult Health

## 2010-10-01 DIAGNOSIS — R29818 Other symptoms and signs involving the nervous system: Secondary | ICD-10-CM | POA: Insufficient documentation

## 2010-10-18 ENCOUNTER — Ambulatory Visit (HOSPITAL_COMMUNITY): Admission: RE | Admit: 2010-10-18 | Discharge: 2010-10-18 | Payer: Self-pay | Admitting: Pulmonary Disease

## 2010-11-01 IMAGING — CR DG LUMBAR SPINE 2-3V
3 series · 3 of 3 positions shown · non-contrast
Comparison: Lumbar spine films of 02/03/2009

CLINICAL DATA: History of lumbar spine surgery October 2008, low
back pain now

LUMBAR SPINE - 2-3 VIEW

[t l-spine a.p.]
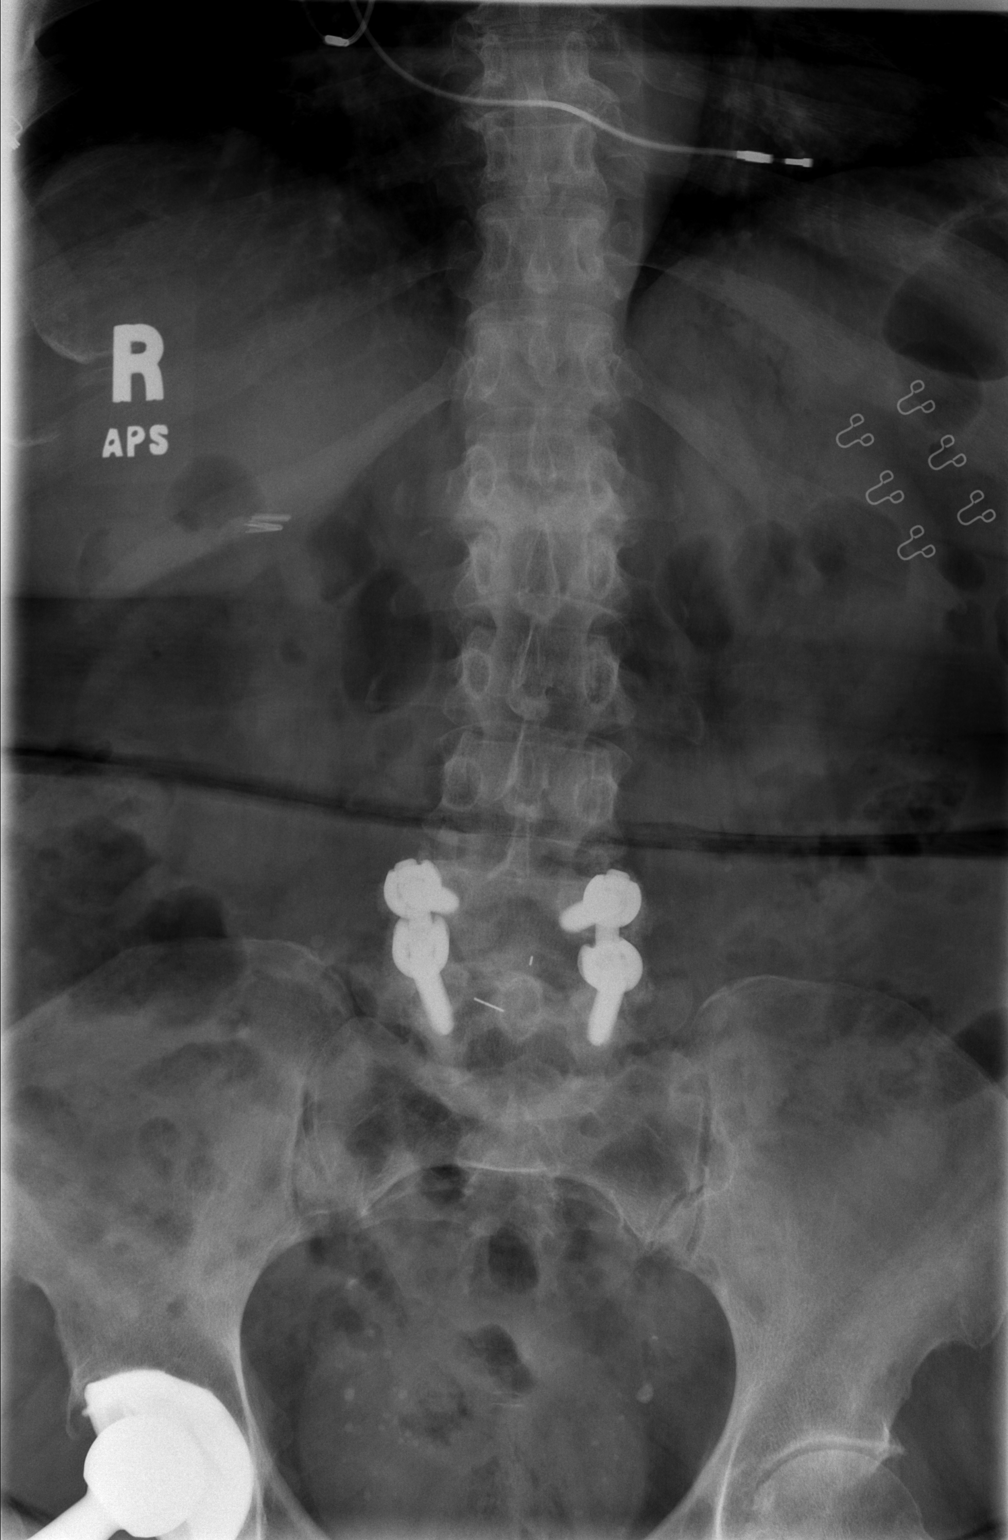

[t l-spine lat]
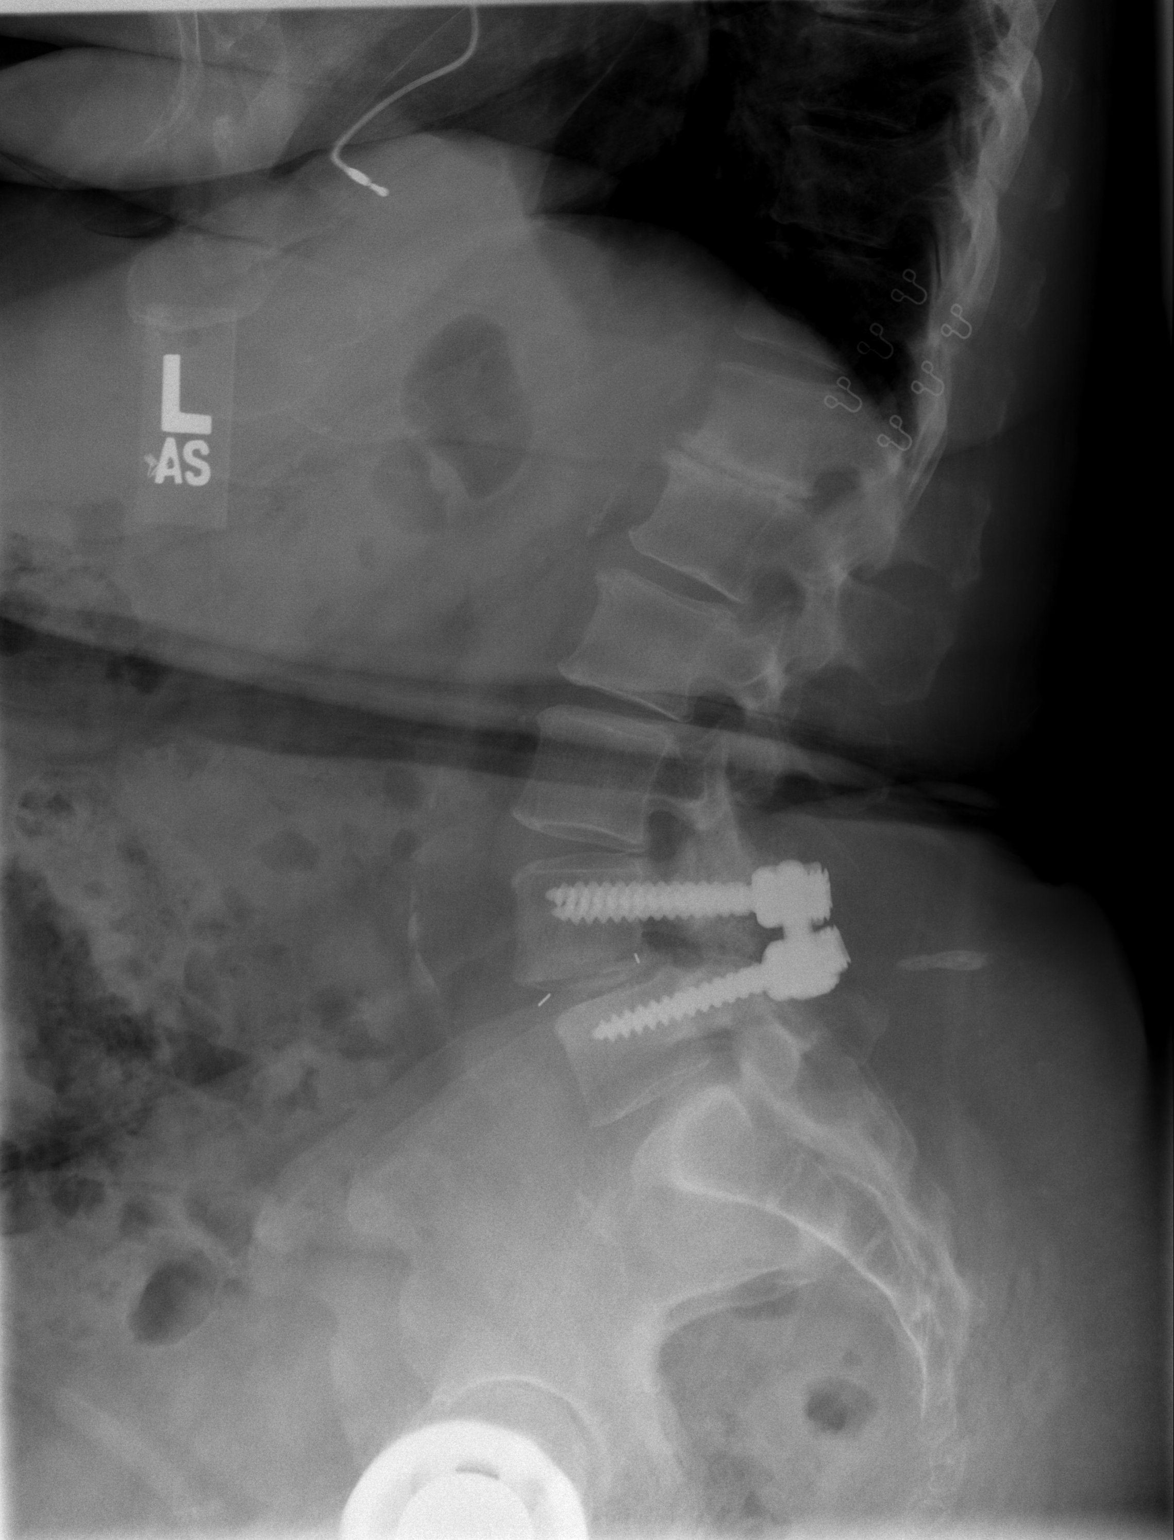

[t l-spine l5-s1 spot]
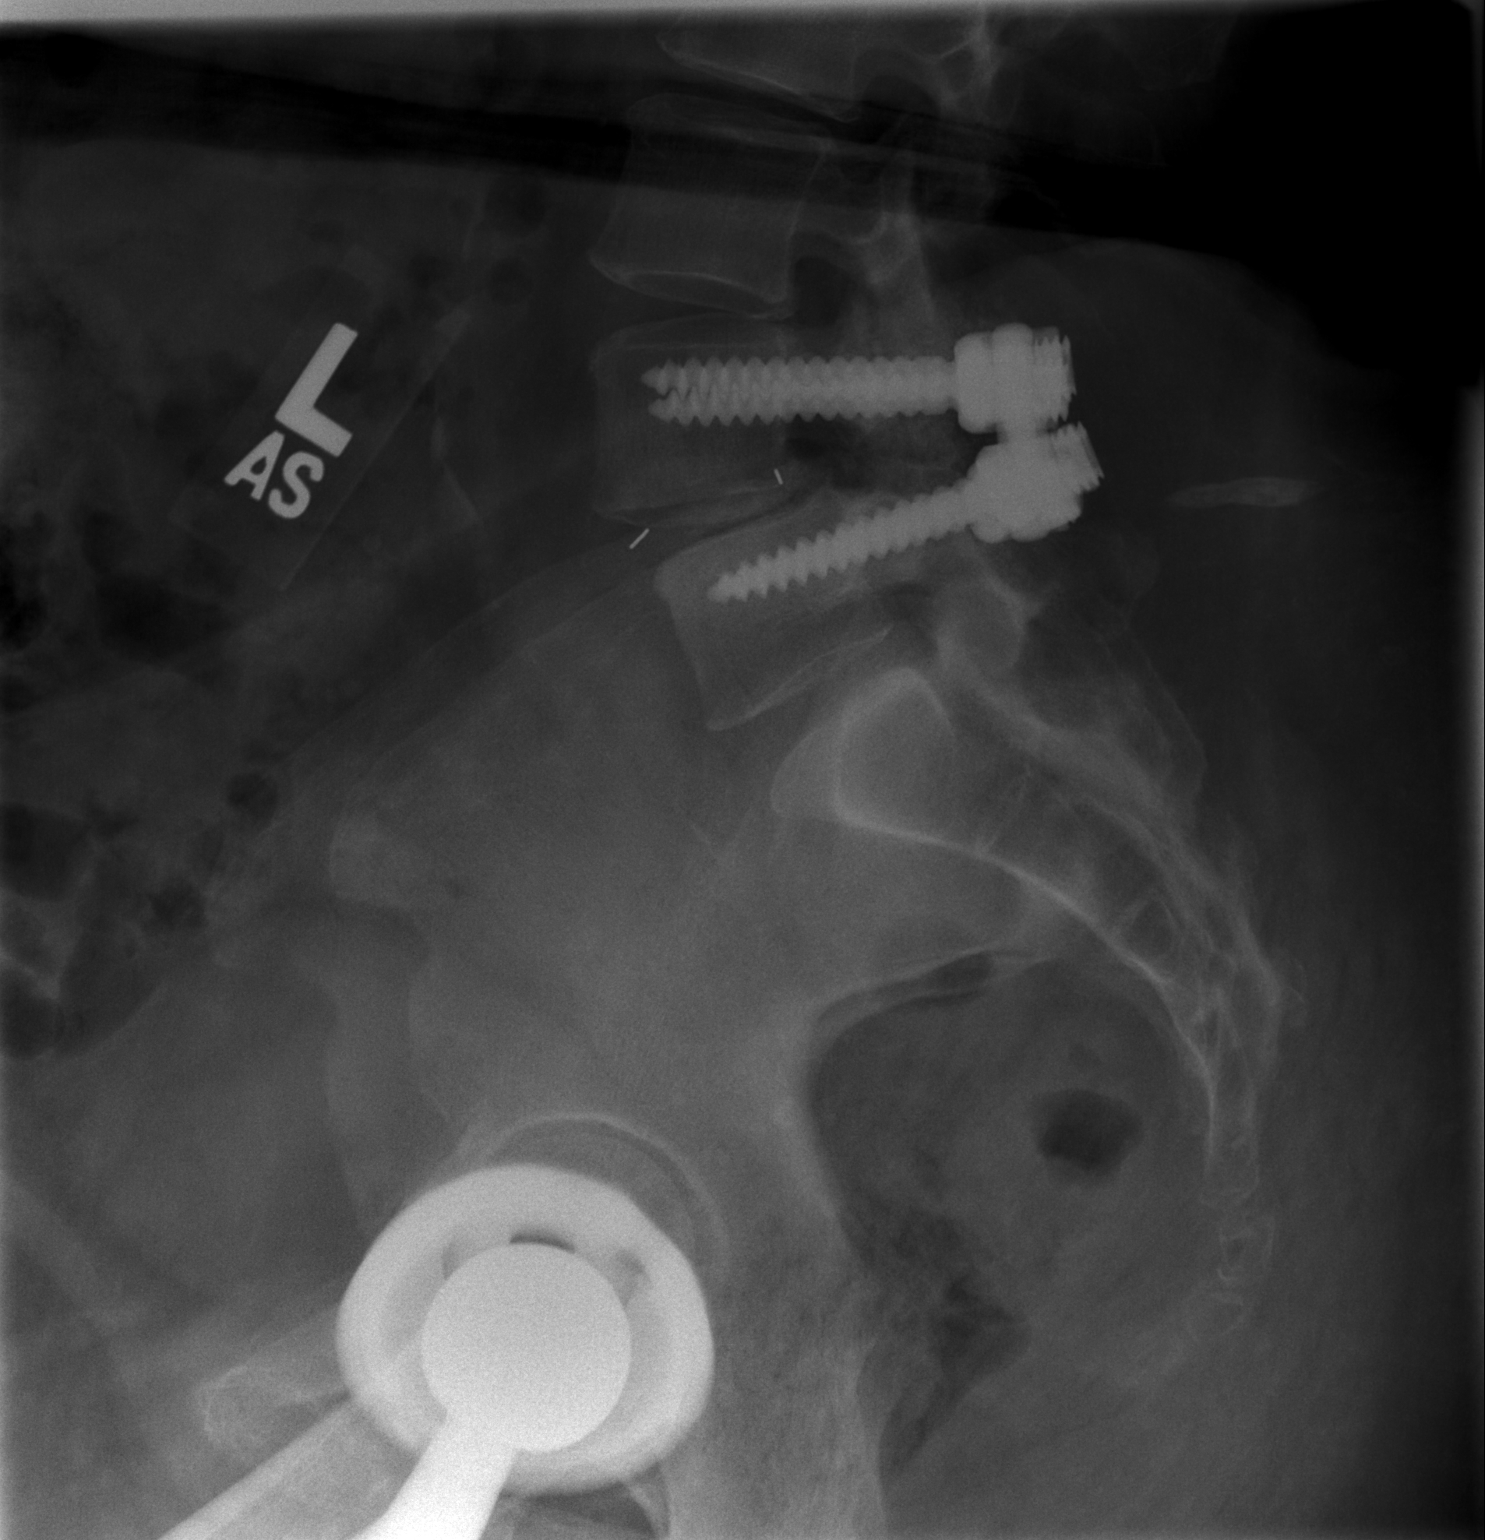

[3 of 3 positions shown; findings below may reference images not displayed]

FINDINGS: Hardware for posterior fusion at L4-5 appears intact.
The interbody fusion plug is somewhat indistinct but this
appearance is stable.  There is no change in very slight
anterolisthesis of L4 on L5.  Advanced degenerative disc disease at
T12-L1 is stable.
IMPRESSION: Stable appearance of posterior fusion at L4-5 with no change in
alignment.

## 2010-11-23 IMAGING — CR DG CHEST 2V
2 series · 2 of 2 positions shown · non-contrast
Comparison: 11/07/2008

CLINICAL DATA: Preadmission

CHEST - 2 VIEW

[view not recorded (1 of 2)]
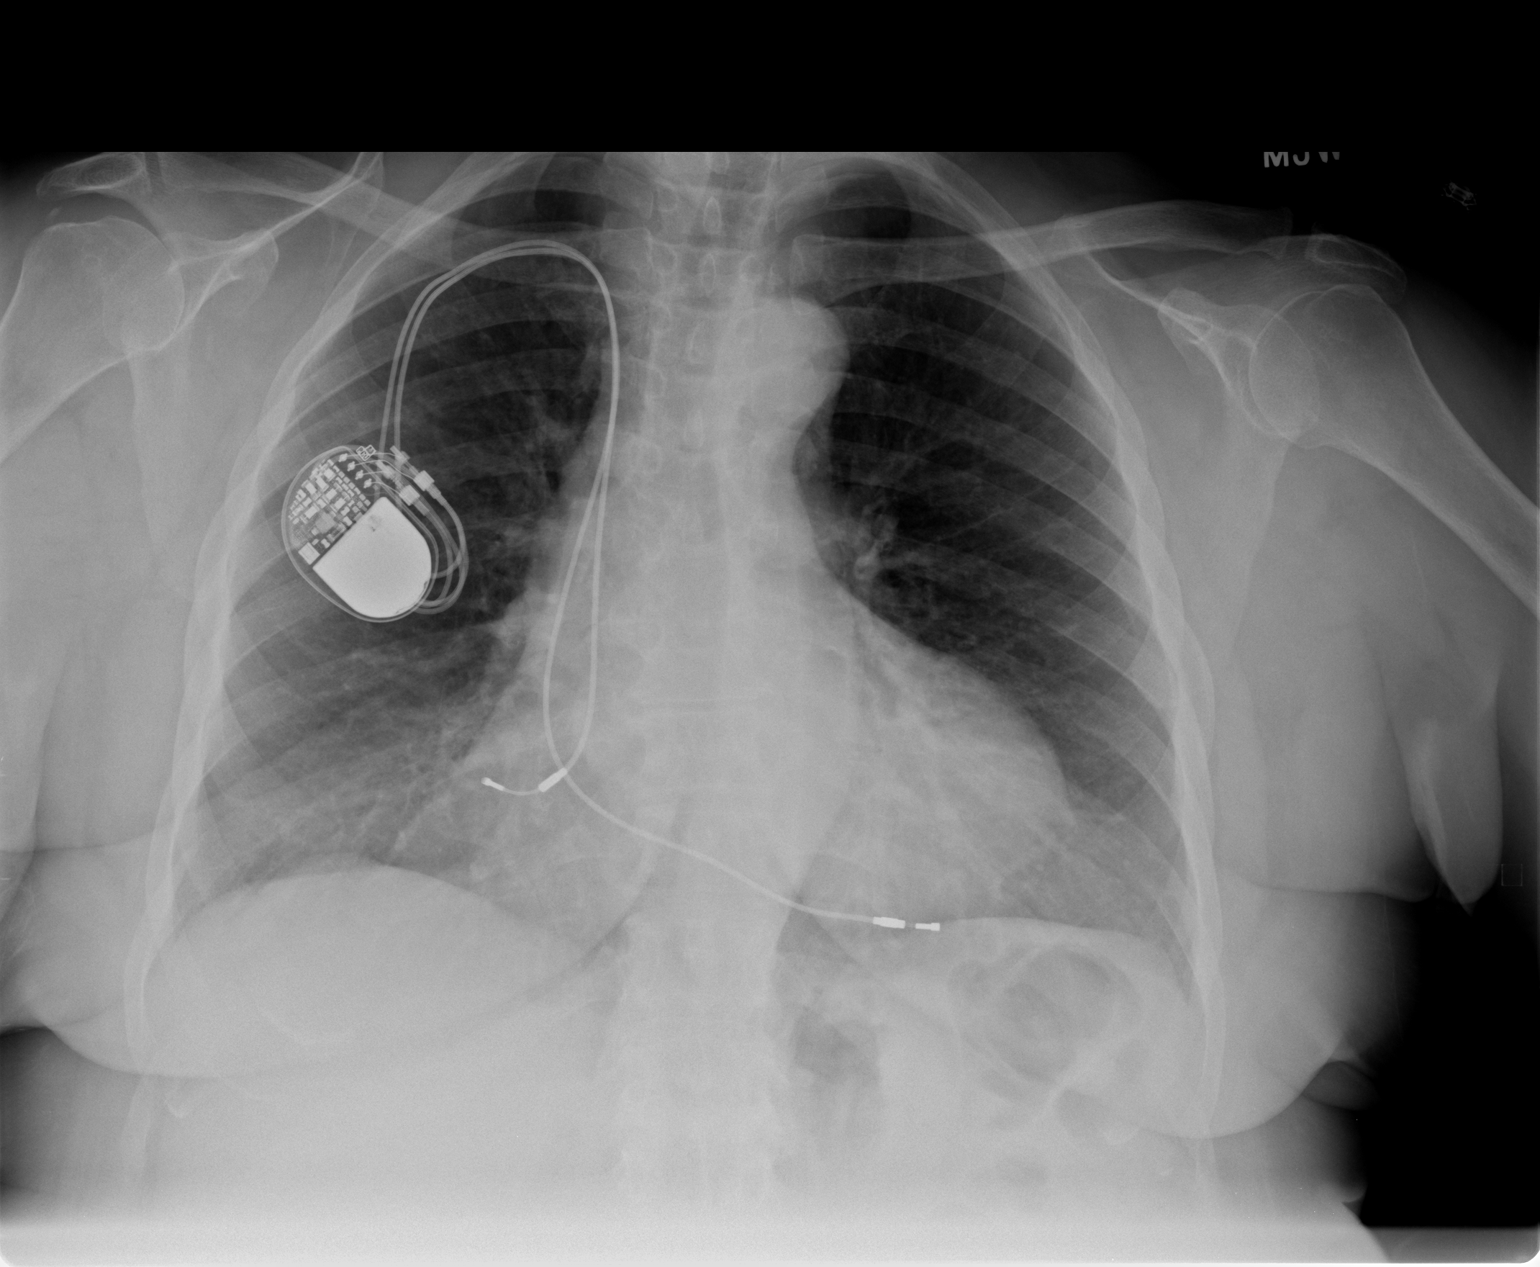

[view not recorded (2 of 2)]
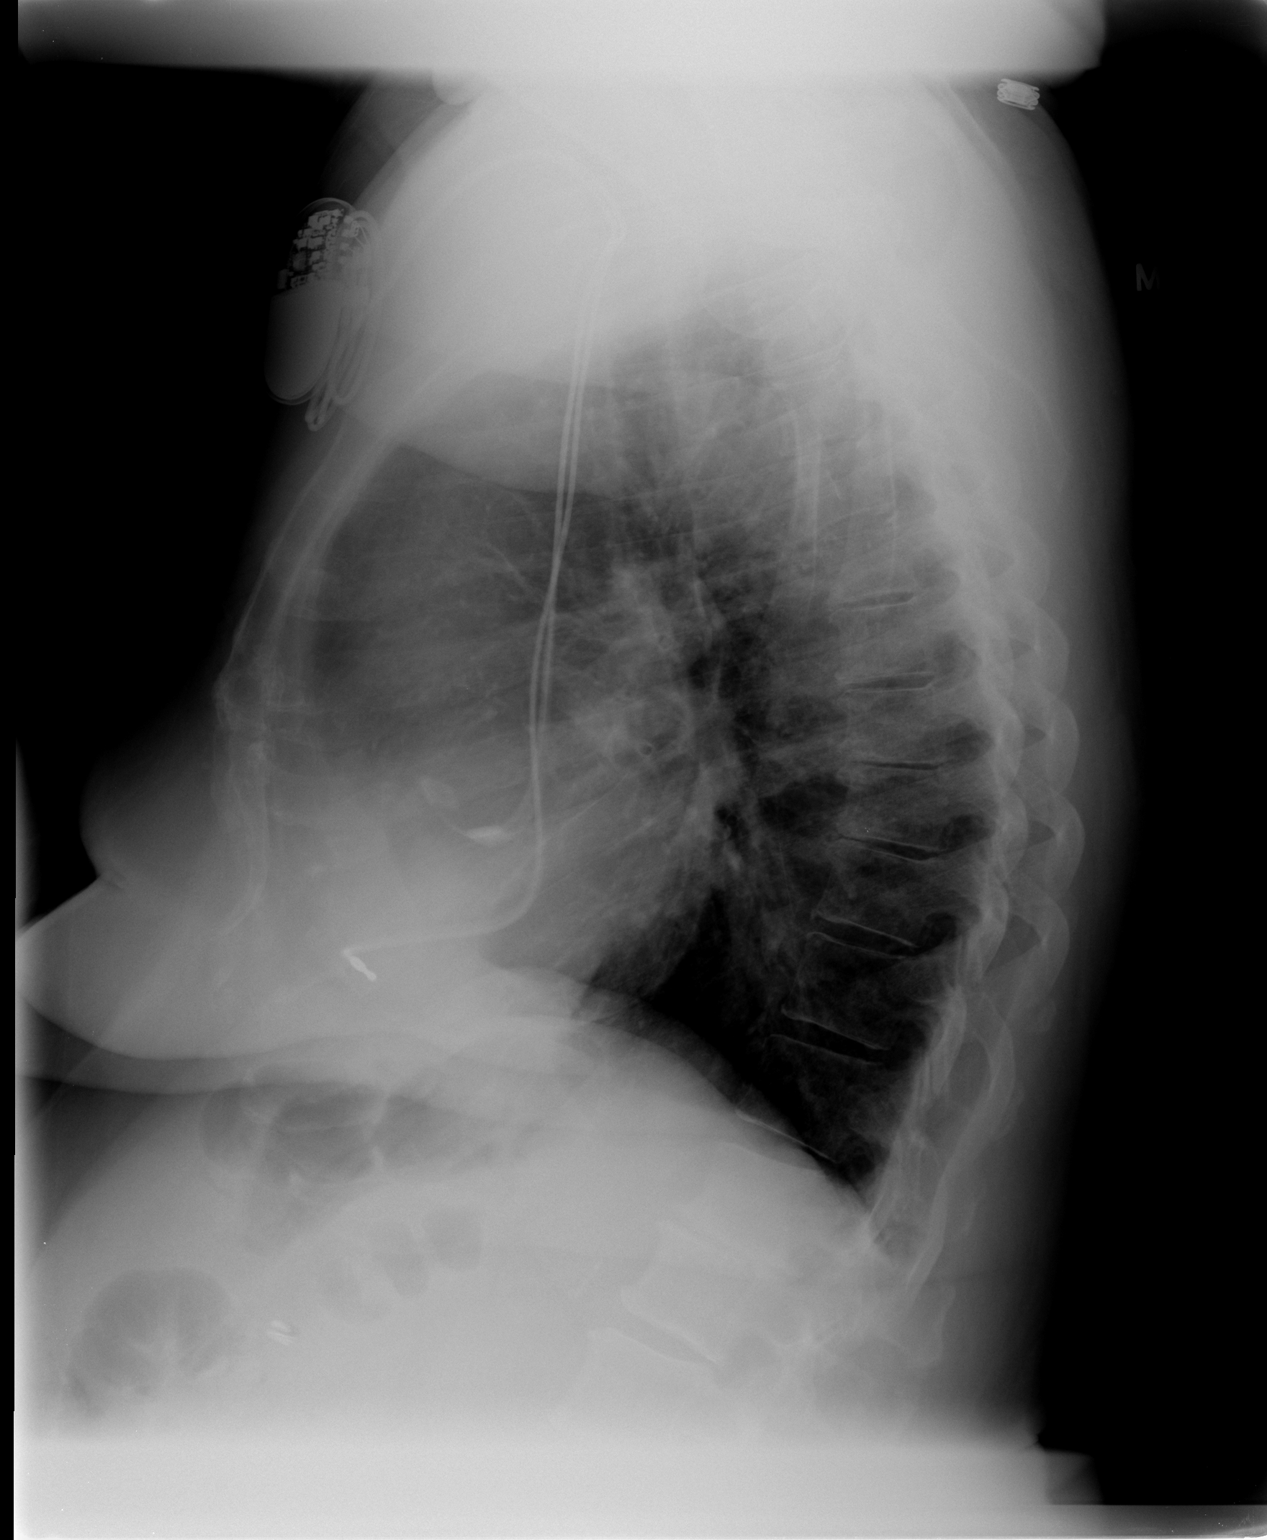

[2 of 2 positions shown; findings below may reference images not displayed]

FINDINGS: Cardiomediastinal silhouette is stable.  Dual lead
cardiac pacemaker is unchanged in position.  No acute infiltrate or
pleural effusion.  No pulmonary edema.  Mild degenerative changes
thoracic spine.
IMPRESSION: No active disease.  Dual lead cardiac pacemaker in place.

## 2010-11-24 IMAGING — CR DG CHEST 1V PORT
1 series · 1 of 1 positions shown · non-contrast
Comparison: Chest radiograph 05/27/2009

CLINICAL DATA: CABG

PORTABLE CHEST - 1 VIEW

[view not recorded]
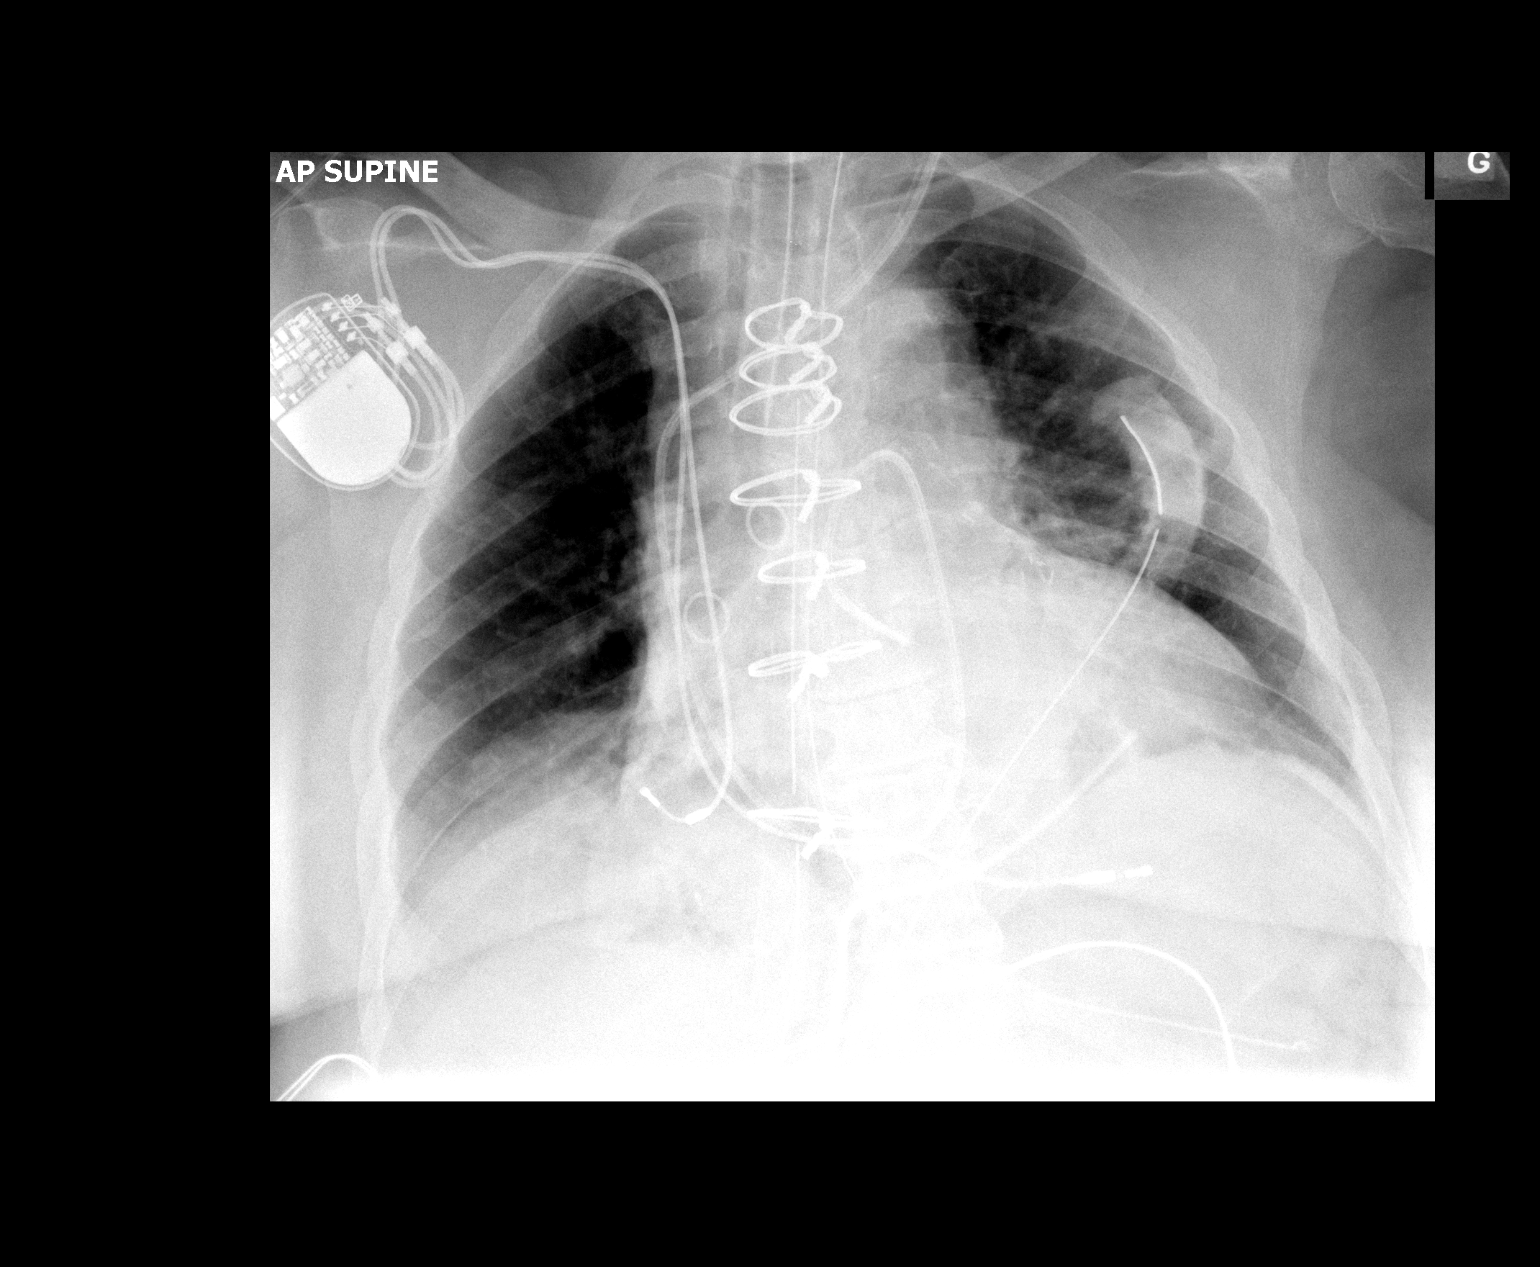

[1 of 1 positions shown; findings below may reference images not displayed]

FINDINGS: Right-sided pacemaker with two continuous leads overlies
stable enlarged cardiac silhouette.  Interval midline sternotomy.
Endotracheal tube is 2 cm from carina.  Swan-Ganz catheter from a
left IJ approach with tip in the right main pulmonary artery.
Mediastinal drain and left chest tube in place.  No evidence
pneumothorax.  Bibasilar atelectasis.
IMPRESSION: Support apparatus  as described.  Endotracheal tube is 2 cm from
carina and could be withdrawn 2 cm.

## 2010-11-26 IMAGING — CR DG CHEST 2V
2 series · 2 of 2 positions shown · non-contrast
Comparison: 05/29/2009

CLINICAL DATA: Coronary artery disease, open heart surgery

CHEST - 2 VIEW

[w chest pa]
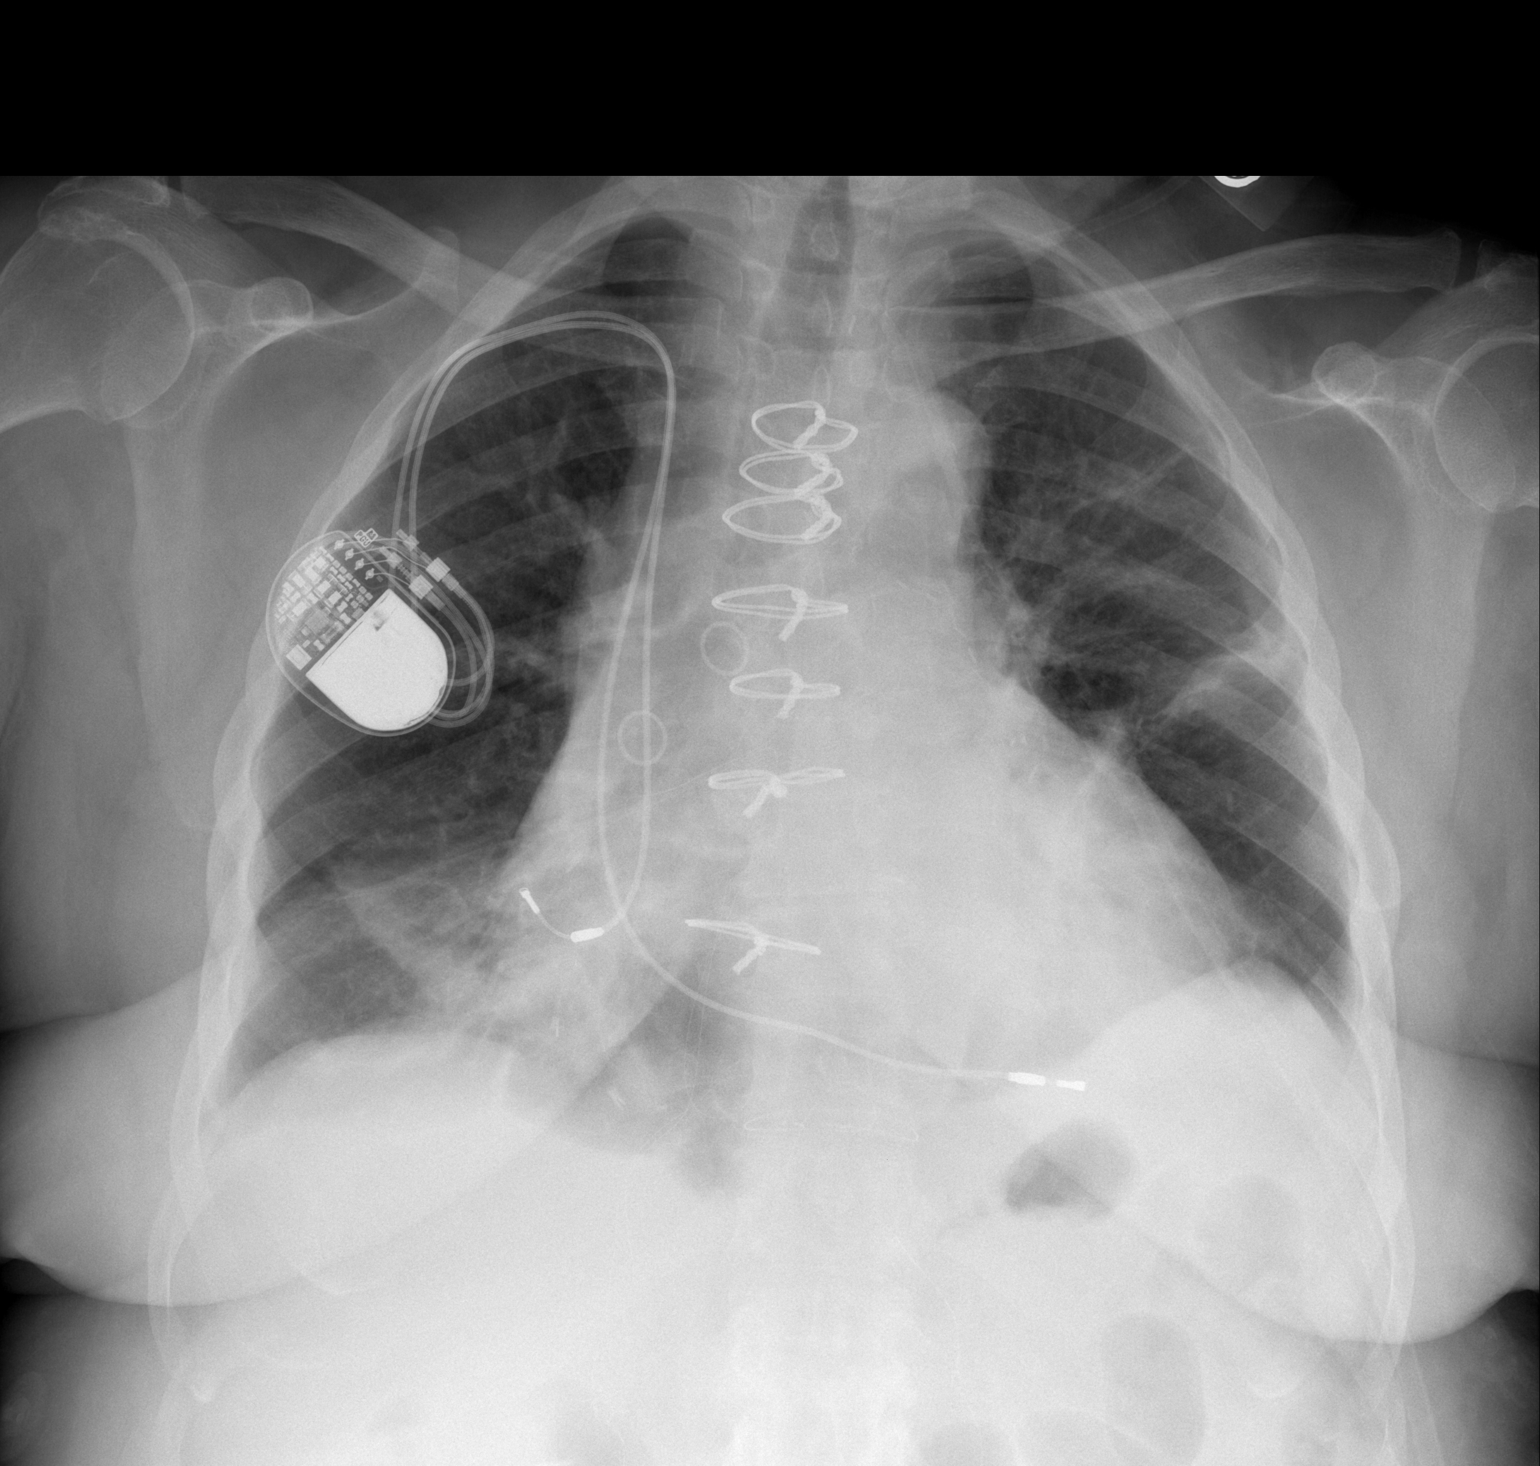

[w chest lat]
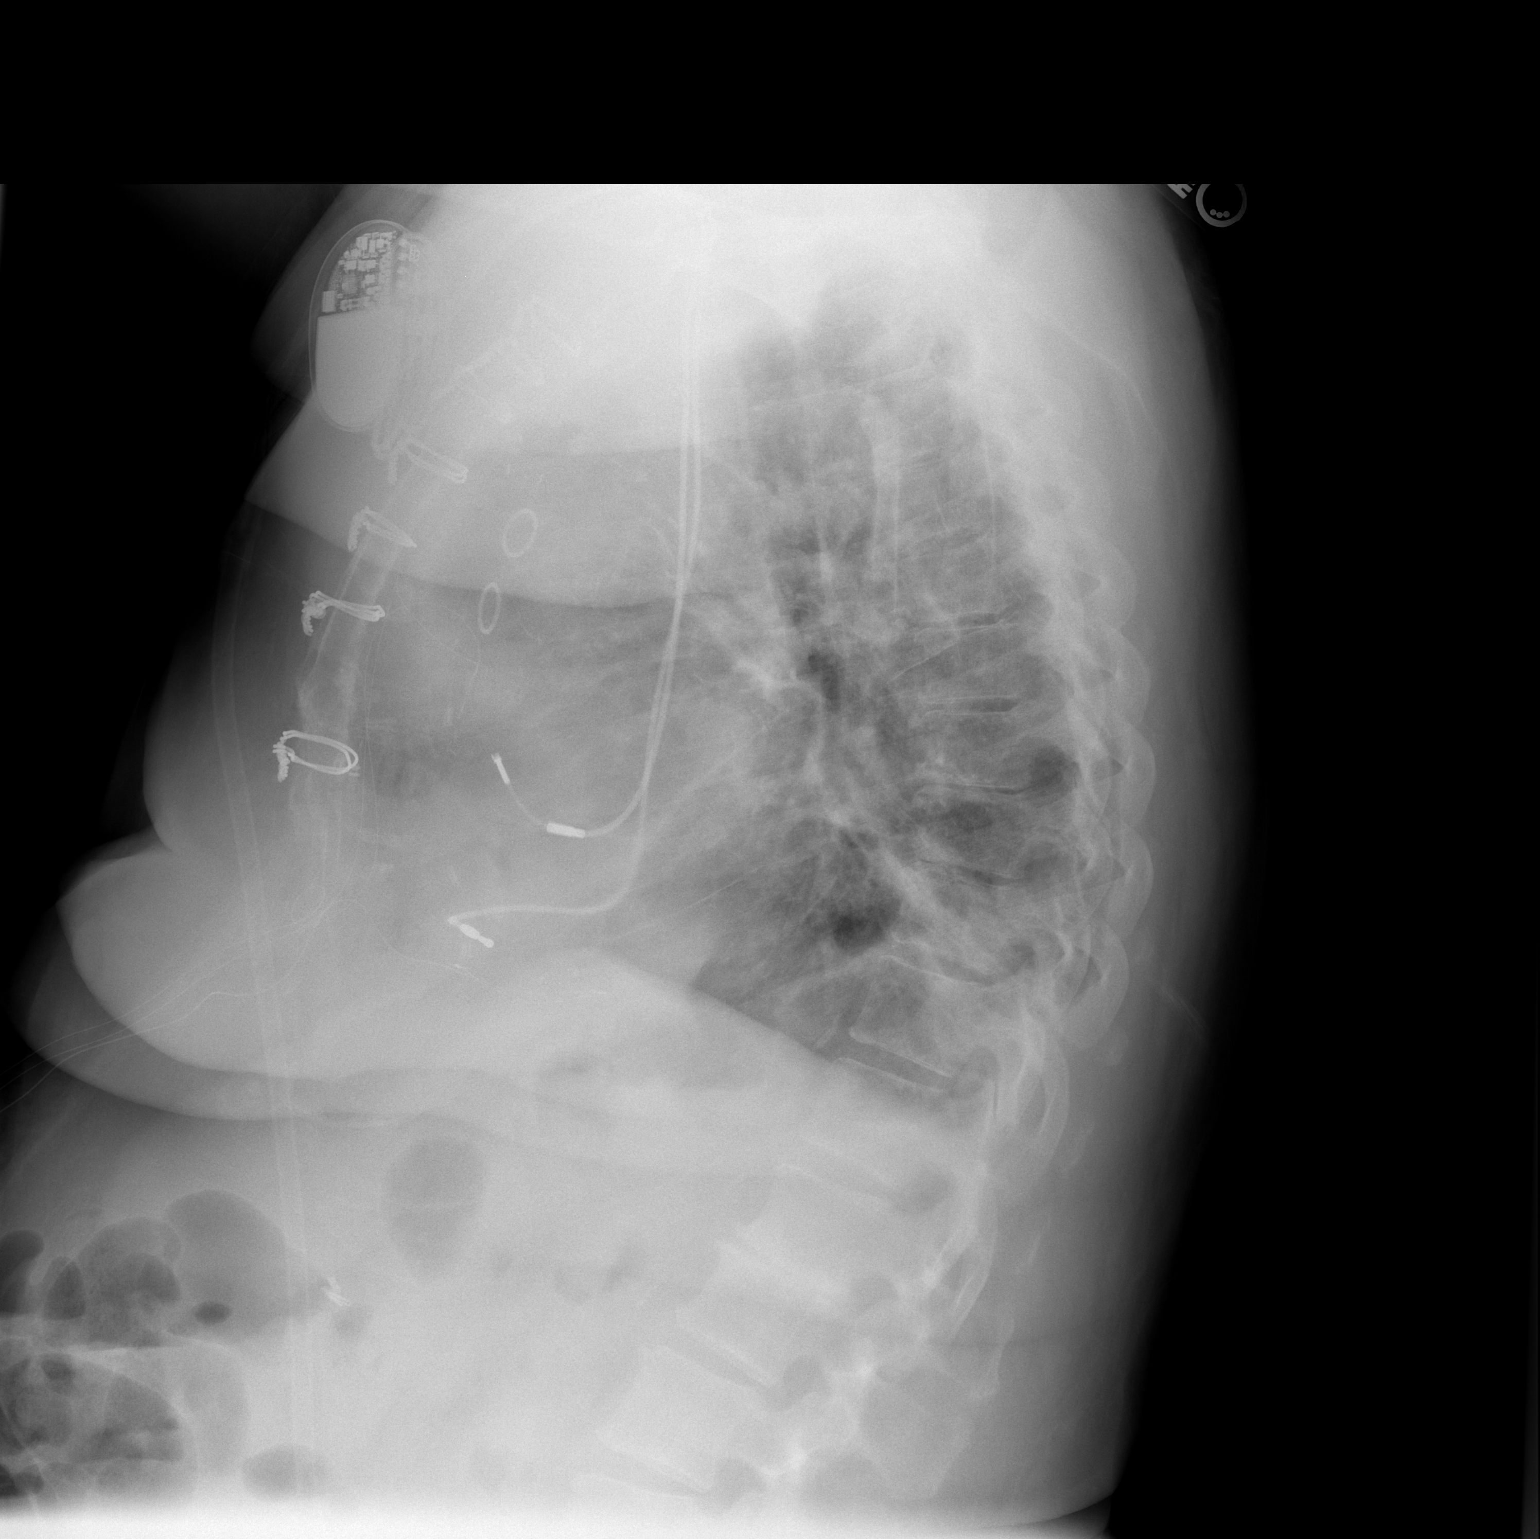

[2 of 2 positions shown; findings below may reference images not displayed]

FINDINGS: Mediastinal drains and left chest tube removed.  Previous
coronary bypass changes noted.  Right subclavian pacemaker
identified.  Persistent cardiomegaly with left hilar and bibasilar
atelectasis.  Slightly better lung volumes.  No large effusion or
pneumothorax.
IMPRESSION: Improving lung volumes.
Residual atelectasis

## 2010-12-02 ENCOUNTER — Inpatient Hospital Stay (HOSPITAL_COMMUNITY)
Admission: EM | Admit: 2010-12-02 | Discharge: 2010-12-08 | Disposition: A | Discharge status: Other Acute Inpatient Hospital | Payer: Self-pay | Source: Home / Self Care | Attending: Pulmonary Disease | Admitting: Pulmonary Disease

## 2010-12-03 IMAGING — CR DG CHEST 1V PORT
1 series · 1 of 1 positions shown · non-contrast
Comparison: A.  05/30/2009

CLINICAL DATA: CABG on 05/28/2009.  Pain and swelling from surgery

PORTABLE CHEST - 1 VIEW

[AP]
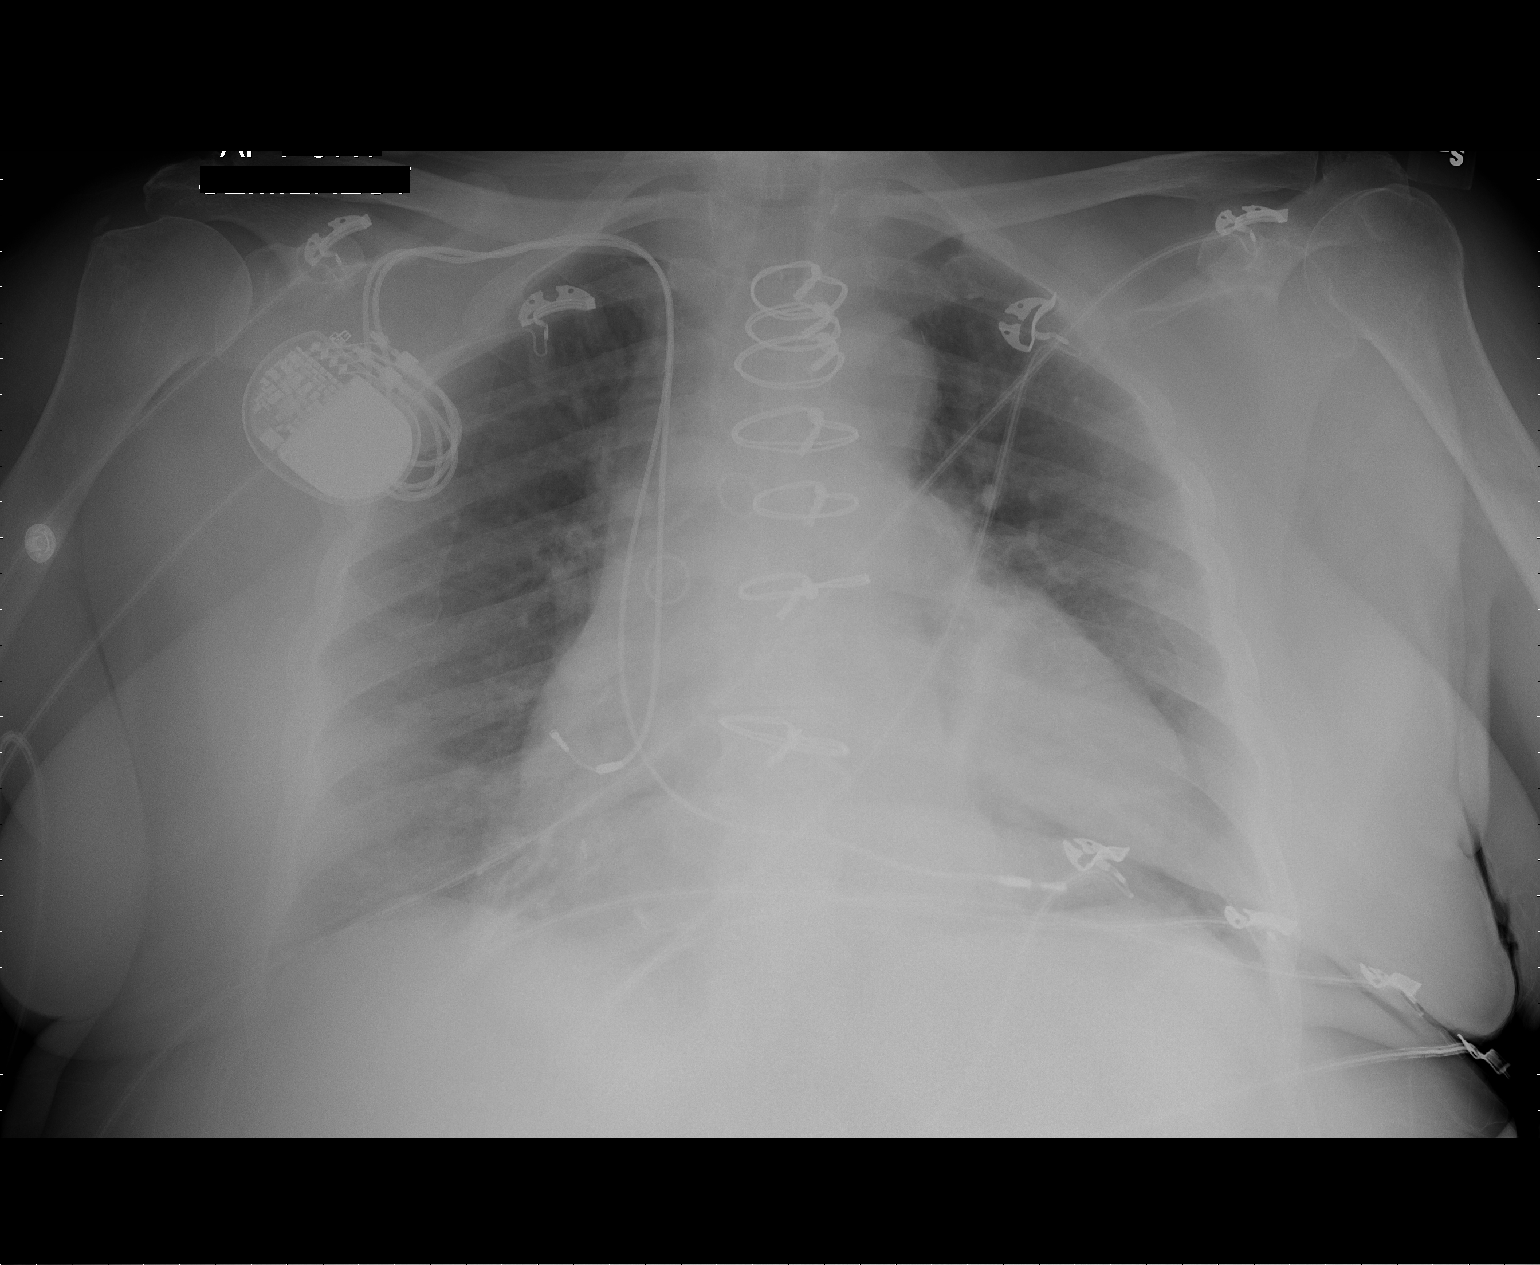

[1 of 1 positions shown; findings below may reference images not displayed]

FINDINGS: There are changes of median sternotomy for CABG.
Moderate cardiomegaly appears stable.  Right subclavian dual lead
pacer is unchanged.  Mild subsegmental atelectasis is noted at the
right lung base and in the left perihilar region.  Overall,
atelectasis in the lungs is decreased.  No pleural effusion is
evident.  There is mild pulmonary vascular congestion.  No definite
pulmonary edema.
IMPRESSION: .
1.  Decreasing atelectasis compared to 05/30/2009.
2.  Cardiomegaly and mild pulmonary vascular congestion.

## 2010-12-05 IMAGING — CR DG CHEST 1V PORT
1 series · 1 of 1 positions shown · non-contrast
Comparison: 06/06/2009

CLINICAL DATA: Fever.  Postoperative wound infection.

PORTABLE CHEST - 1 VIEW

[view not recorded]
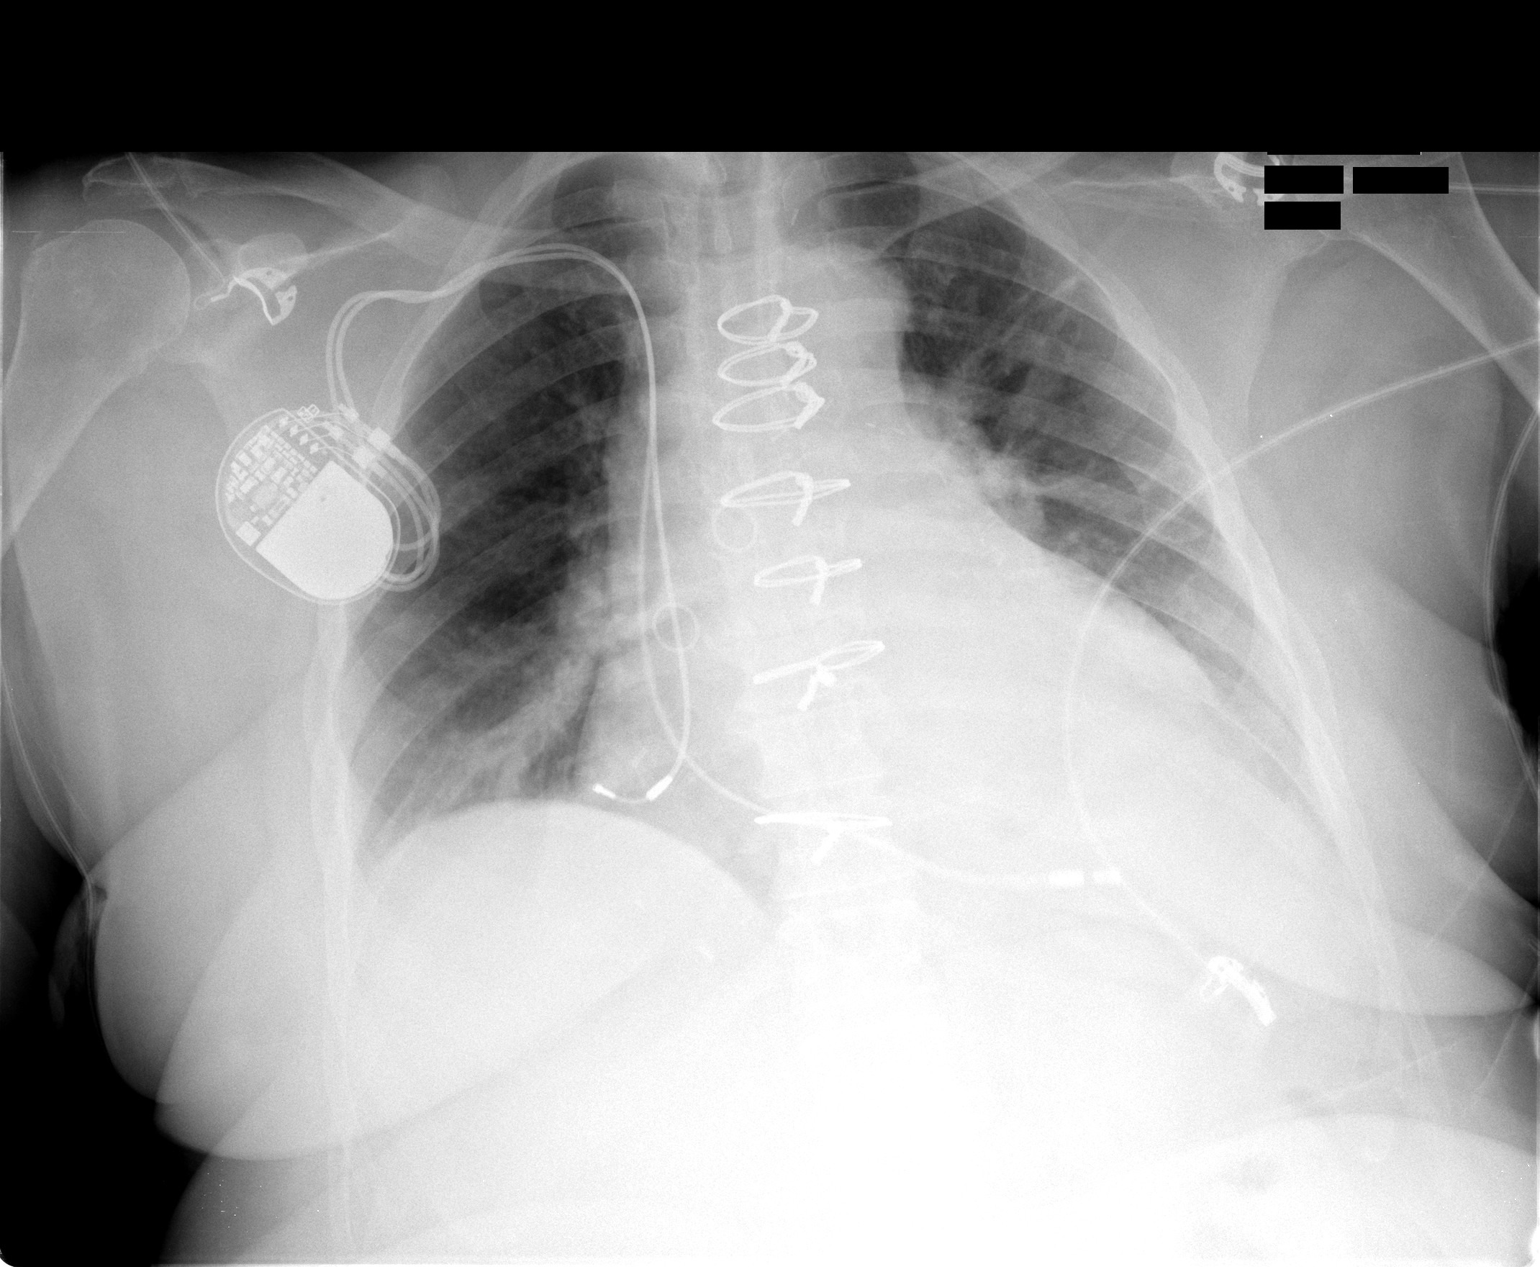

[1 of 1 positions shown; findings below may reference images not displayed]

FINDINGS: Portable view of the chest was obtained.  The patient has
a right cardiac pacemaker.  The cardiac silhouette is enlarged with
persistent opacities in the left lung base.   Densities in the
right lung base may represent atelectasis.  Trachea is midline.
IMPRESSION: Left base opacification may represent atelectasis and possibly
pleural fluid.

Right basilar atelectasis.

Cardiomegaly.

## 2010-12-05 IMAGING — CR DG CHEST 1V PORT
1 series · 1 of 1 positions shown · non-contrast
Comparison: Portable chest x-ray of 06/08/2009

CLINICAL DATA: Postop wound infection, fever

PORTABLE CHEST - 1 VIEW

[AP]
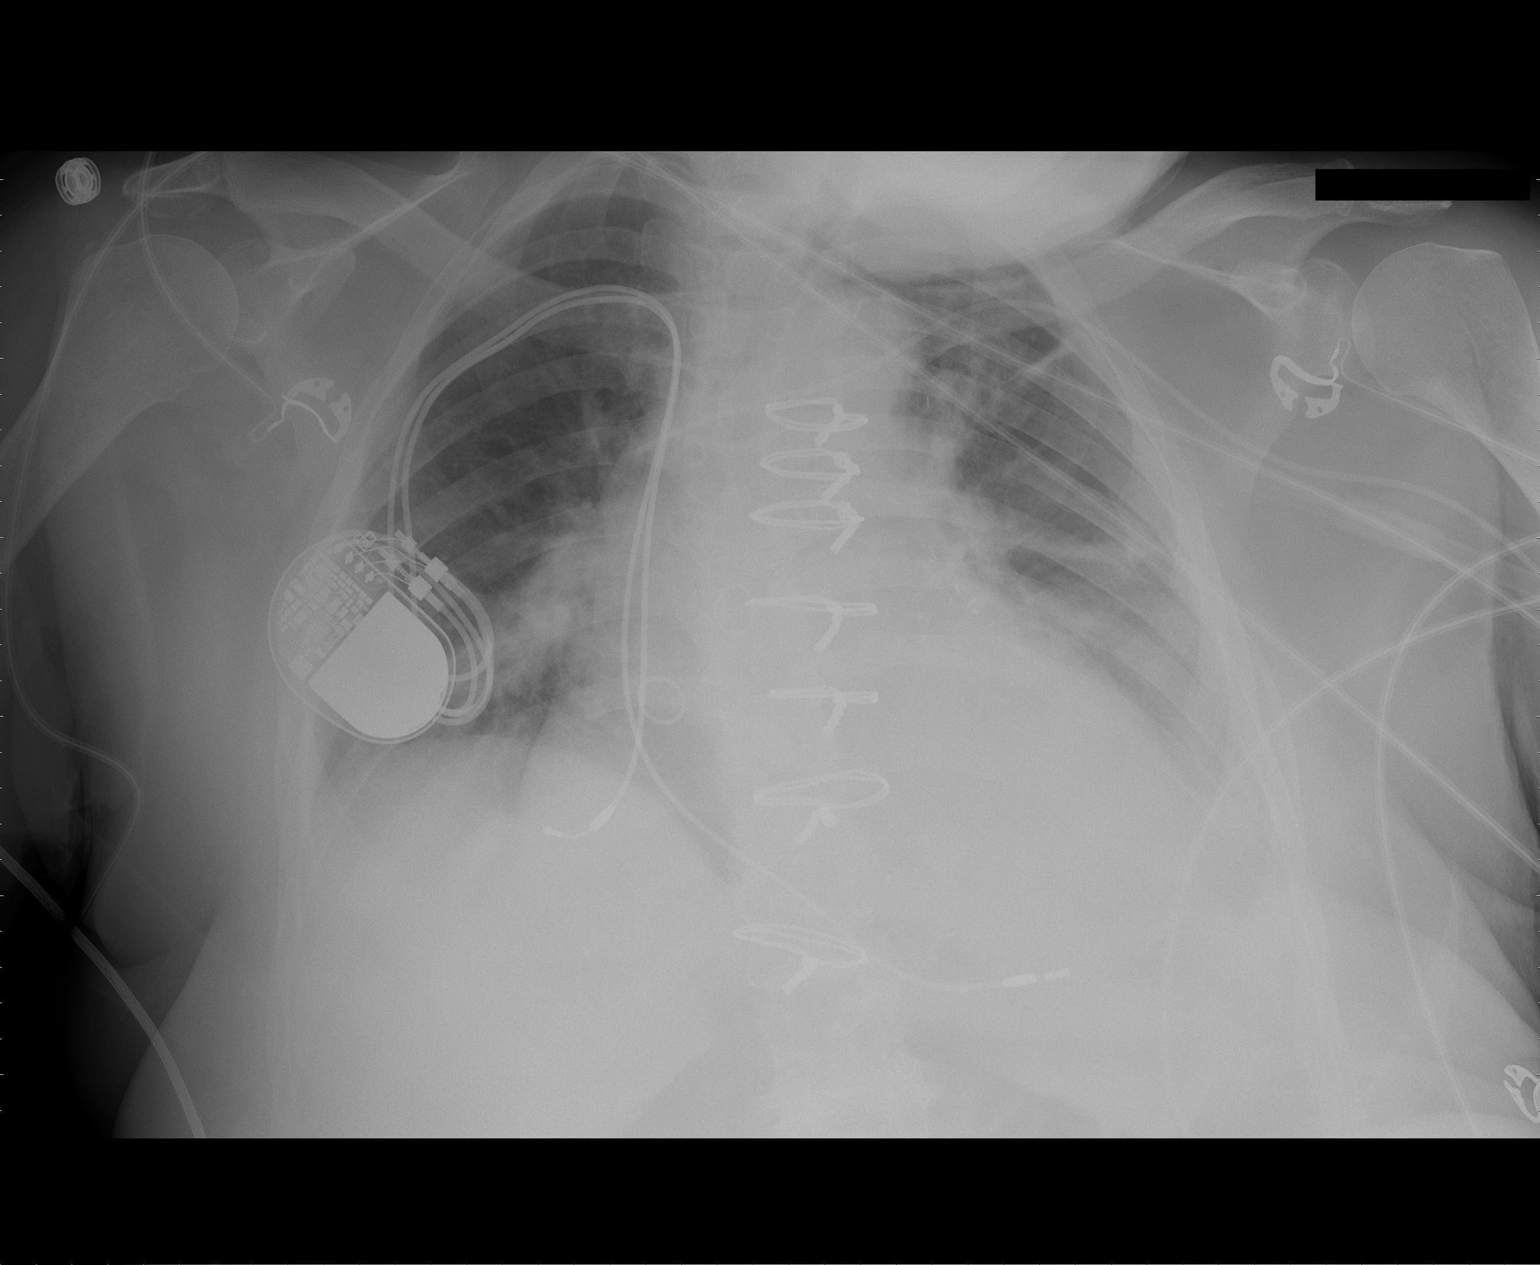

[1 of 1 positions shown; findings below may reference images not displayed]

FINDINGS: The lungs are not as well aerated, and opacity remains at
the bases left greater than right, most consistent with atelectasis
and probable effusion.  Cardiomegaly is stable.  Permanent
pacemaker remains.  Left central venous catheter is noted with the
tip in the mid SVC and no pneumothorax is seen.
IMPRESSION: 1.  Diminished aeration with increasing basilar atelectasis left
greater than right.  Difficult to exclude pneumonia at the left
lung base.
 2.  Left central venous catheter tip in mid SVC.  No pneumothorax.

## 2010-12-05 IMAGING — CT CT CHEST W/ CM
2 of 3 series · 15 of 36 positions shown, 18 images · IV contrast (APPLIED)
Comparison: Recent chest x-rays.  No prior CT exams of the chest.

CLINICAL DATA: Postoperative chest wall wound infection and fever
status post CABG.

CT CHEST WITH CONTRAST
TECHNIQUE: Multidetector CT imaging of the chest was performed
following the standard protocol during bolus administration of
intravenous contrast.
Contrast: 76 ml Jmnipaque-SNN IV

[Series 2: routine chest 5.0 st · axial · 0.71mm/px · z∈[-251,-21]mm · 12 of 56 slices shown, 15 images]
[im 5/56  mediastinal]
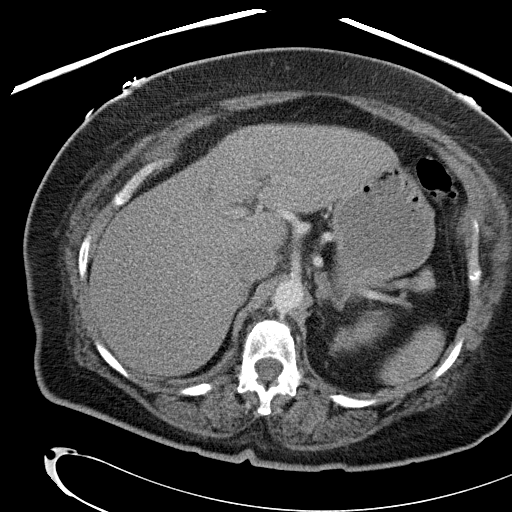
[im 5/56  lung]
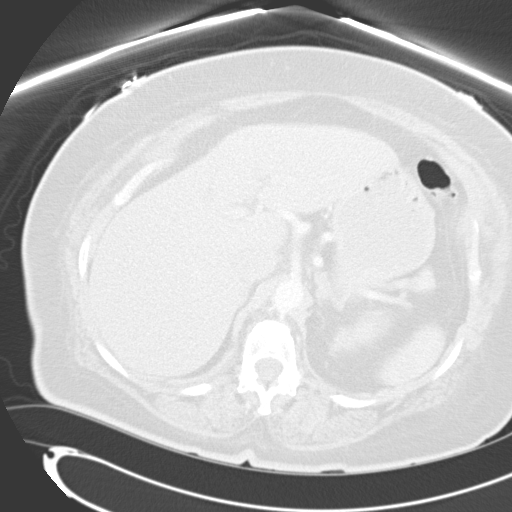
[im 9/56  lung]
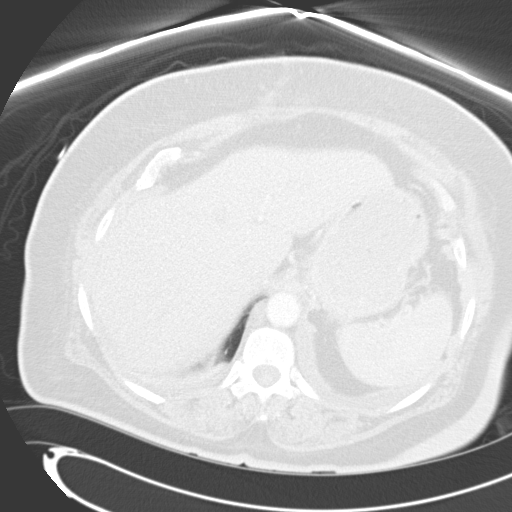
[im 13/56  lung]
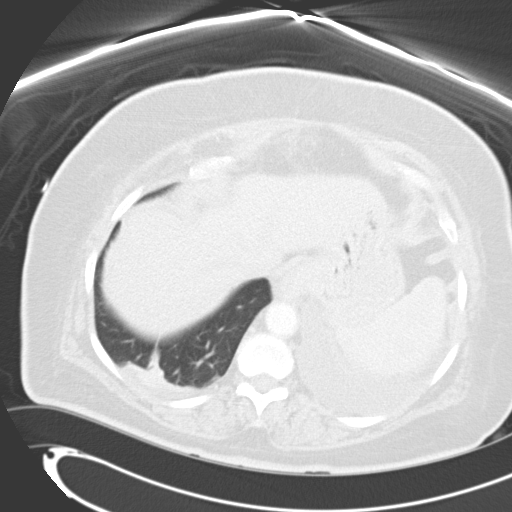
[im 17/56  lung]
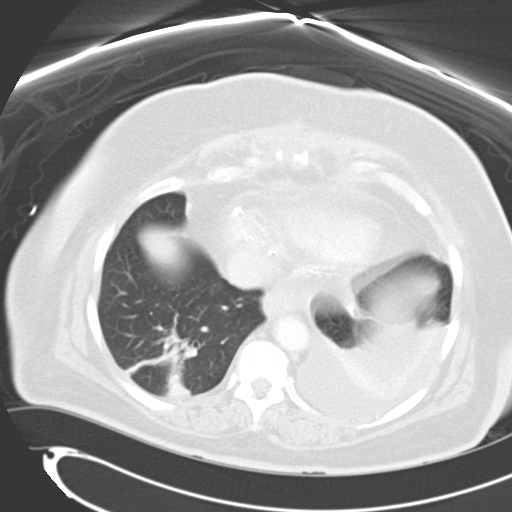
[im 21/56  mediastinal]
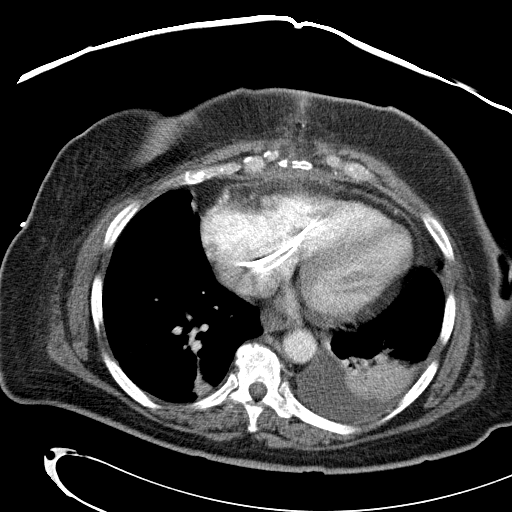
[im 21/56  lung]
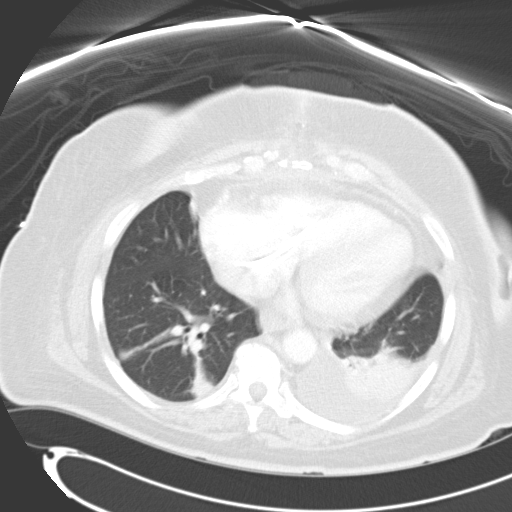
[im 25/56  lung]
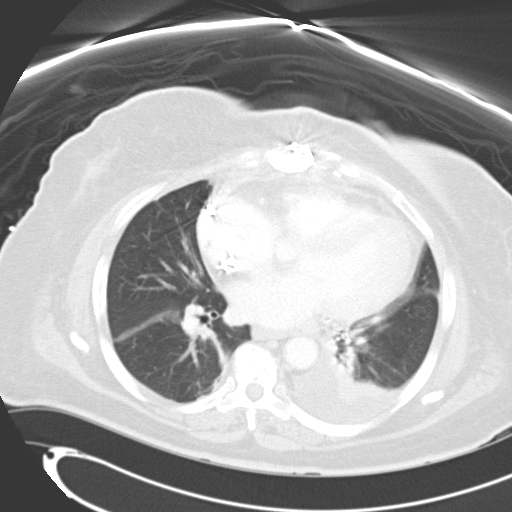
[im 31/56  lung]
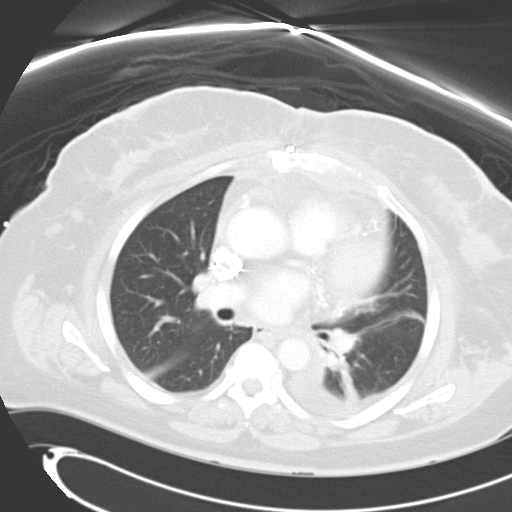
[im 35/56  lung]
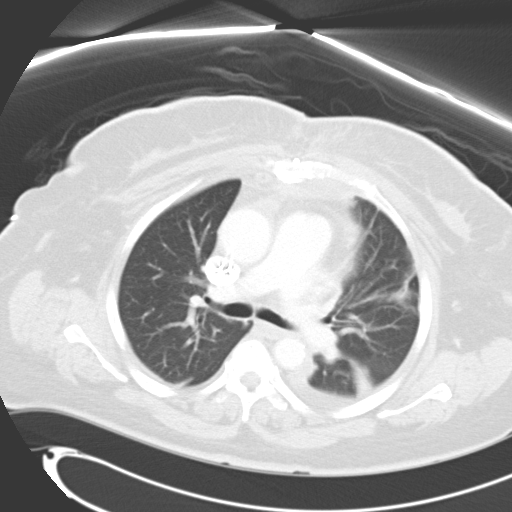
[im 39/56  mediastinal]
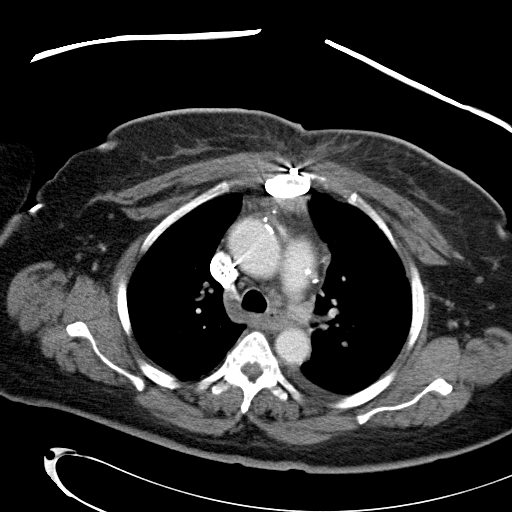
[im 39/56  lung]
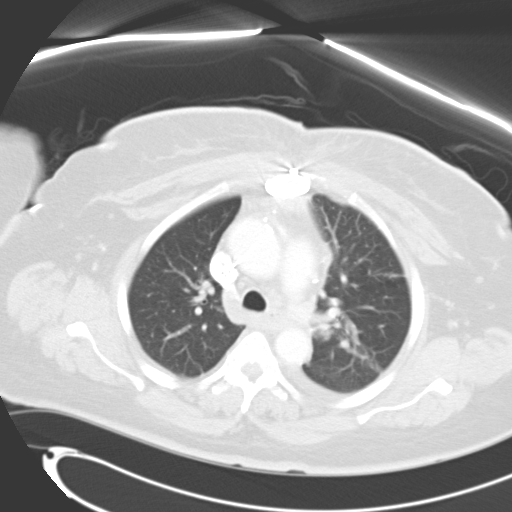
[im 43/56  lung]
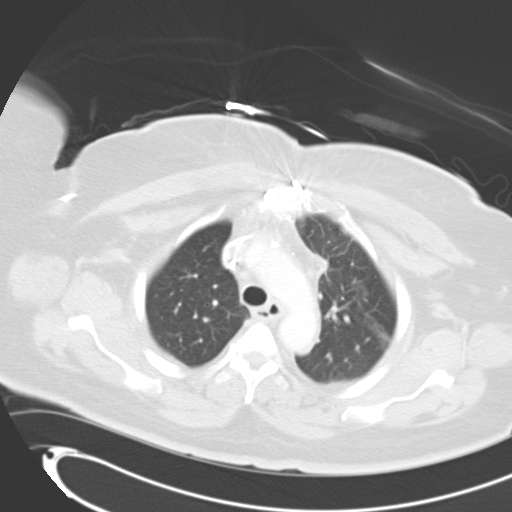
[im 47/56  lung]
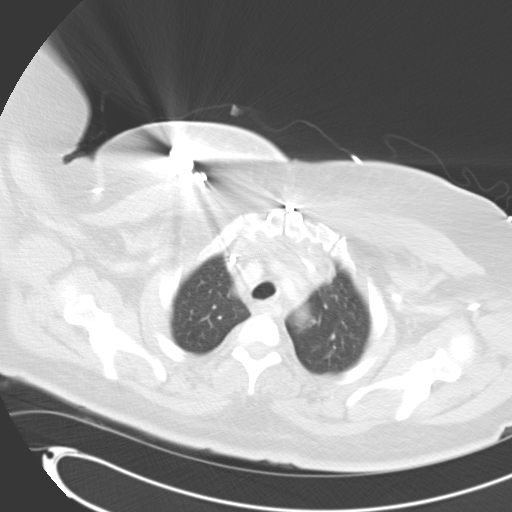
[im 51/56  lung]
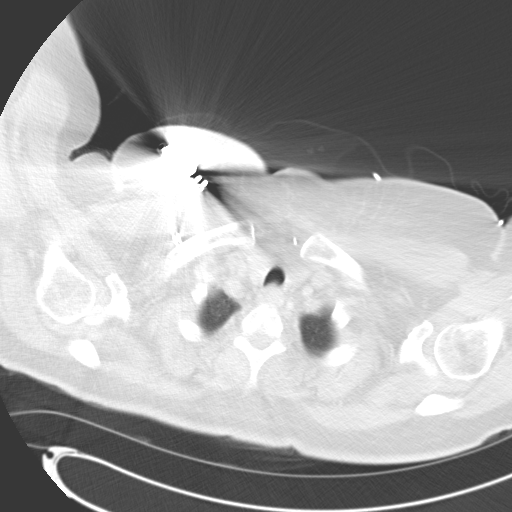

[Series 5: routine chest 3.0 cor · coronal · 0.71mm/px · 3 of 80 slices shown]
[im 16/80  lung]
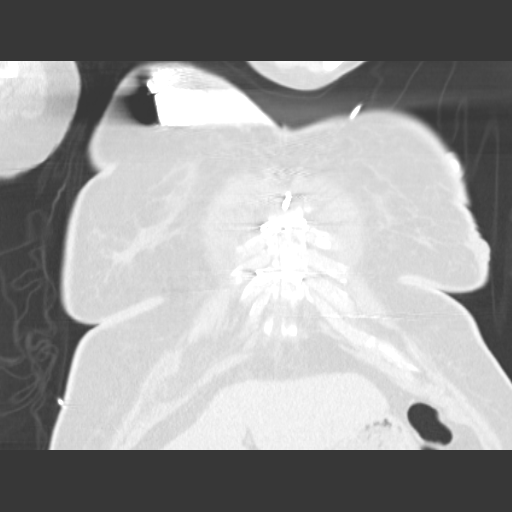
[im 32/80  lung]
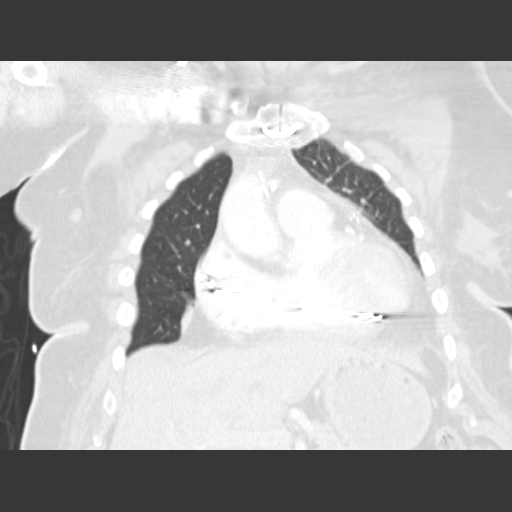
[im 48/80  lung]
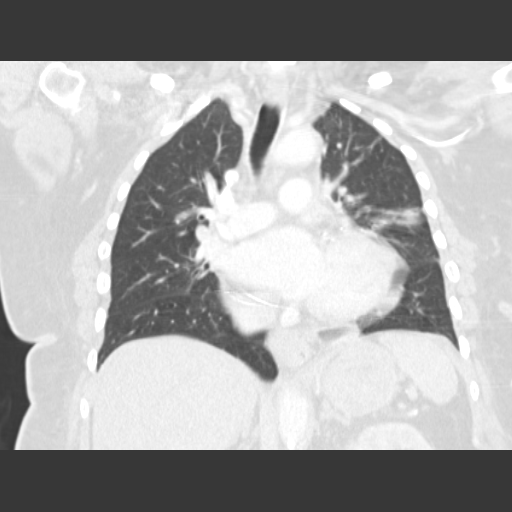

[15 of 36 positions shown; findings below may reference images not displayed]

FINDINGS: There is no evidence of chest wall abscess or gross
mediastinitis.  Some focal inflammation and edema is present just
deep to the chest wall to the left of midline and superficial to
the pericardium.  No soft tissue air is identified.

Small left pleural effusion present with associated left lower lobe
atelectasis.  There is a trace amount of right pleural fluid with
mild right lower lobe atelectasis present.  No edema or
pneumothorax seen.  No incidental nodules or enlarged lymph nodes.
IMPRESSION: Inflammatory changes and small amount of fluid just deep to the
left anterior chest wall.  This does not extend further into the
mediastinum and there is no evidence by CT of a focal abscess.

## 2010-12-06 ENCOUNTER — Inpatient Hospital Stay (HOSPITAL_COMMUNITY)
Admission: AD | Admit: 2010-12-06 | Discharge: 2010-12-08 | Payer: Self-pay | Attending: Cardiology | Admitting: Cardiology

## 2010-12-08 ENCOUNTER — Encounter: Payer: Self-pay | Admitting: Cardiology

## 2010-12-08 LAB — BASIC METABOLIC PANEL
BUN: 10 mg/dL (ref 6–23)
CO2: 23 mEq/L (ref 19–32)
Calcium: 9.7 mg/dL (ref 8.4–10.5)
Chloride: 109 mEq/L (ref 96–112)
Creatinine, Ser: 0.79 mg/dL (ref 0.4–1.2)
GFR calc Af Amer: >60 mL/min (ref >60)
GFR calc non Af Amer: >60 mL/min (ref >60)
Glucose, Bld: 126 mg/dL — ABNORMAL HIGH (ref 70–99)
Potassium: 4.2 mEq/L (ref 3.5–5.1)
Sodium: 141 mEq/L (ref 135–145)

## 2010-12-08 LAB — CBC
HCT: 37.3 % (ref 36.0–46.0)
Hemoglobin: 12.1 g/dL (ref 12.0–15.0)
MCH: 28.3 pg (ref 26.0–34.0)
MCHC: 32.4 g/dL (ref 30.0–36.0)
MCV: 87.4 fL (ref 78.0–100.0)
Platelets: 170 10*3/uL (ref 150–400)
RBC: 4.27 MIL/uL (ref 3.87–5.11)
RDW: 14.7 % (ref 11.5–15.5)
WBC: 8 10*3/uL (ref 4.0–10.5)

## 2010-12-08 LAB — GLUCOSE, CAPILLARY
Glucose-Capillary: 113 mg/dL — ABNORMAL HIGH (ref 70–99)
Glucose-Capillary: 114 mg/dL — ABNORMAL HIGH (ref 70–99)
Glucose-Capillary: 130 mg/dL — ABNORMAL HIGH (ref 70–99)
Glucose-Capillary: 99 mg/dL (ref 70–99)

## 2010-12-09 LAB — POCT I-STAT 3, ART BLOOD GAS (G3+)
Acid-base deficit: 1 mmol/L (ref 0.0–2.0)
Bicarbonate: 23.6 mEq/L (ref 20.0–24.0)
O2 Saturation: 96 %
TCO2: 25 mmol/L (ref 0–100)
pCO2 arterial: 38 mmHg (ref 35.0–45.0)
pH, Arterial: 7.401 — ABNORMAL HIGH (ref 7.350–7.400)
pO2, Arterial: 83 mmHg (ref 80.0–100.0)

## 2010-12-09 LAB — POCT I-STAT 3, VENOUS BLOOD GAS (G3P V)
Acid-base deficit: 2 mmol/L (ref 0.0–2.0)
Bicarbonate: 24 mEq/L (ref 20.0–24.0)
O2 Saturation: 65 %
TCO2: 25 mmol/L (ref 0–100)
pCO2, Ven: 44.8 mmHg — ABNORMAL LOW (ref 45.0–50.0)
pH, Ven: 7.338 — ABNORMAL HIGH (ref 7.250–7.300)
pO2, Ven: 36 mmHg (ref 30.0–45.0)

## 2010-12-17 IMAGING — CR DG CHEST 1V PORT
1 series · 1 of 1 positions shown · non-contrast
Comparison: 06/08/2009

CLINICAL DATA: Fever and postoperative wound infection.

PORTABLE CHEST - 1 VIEW

[AP]
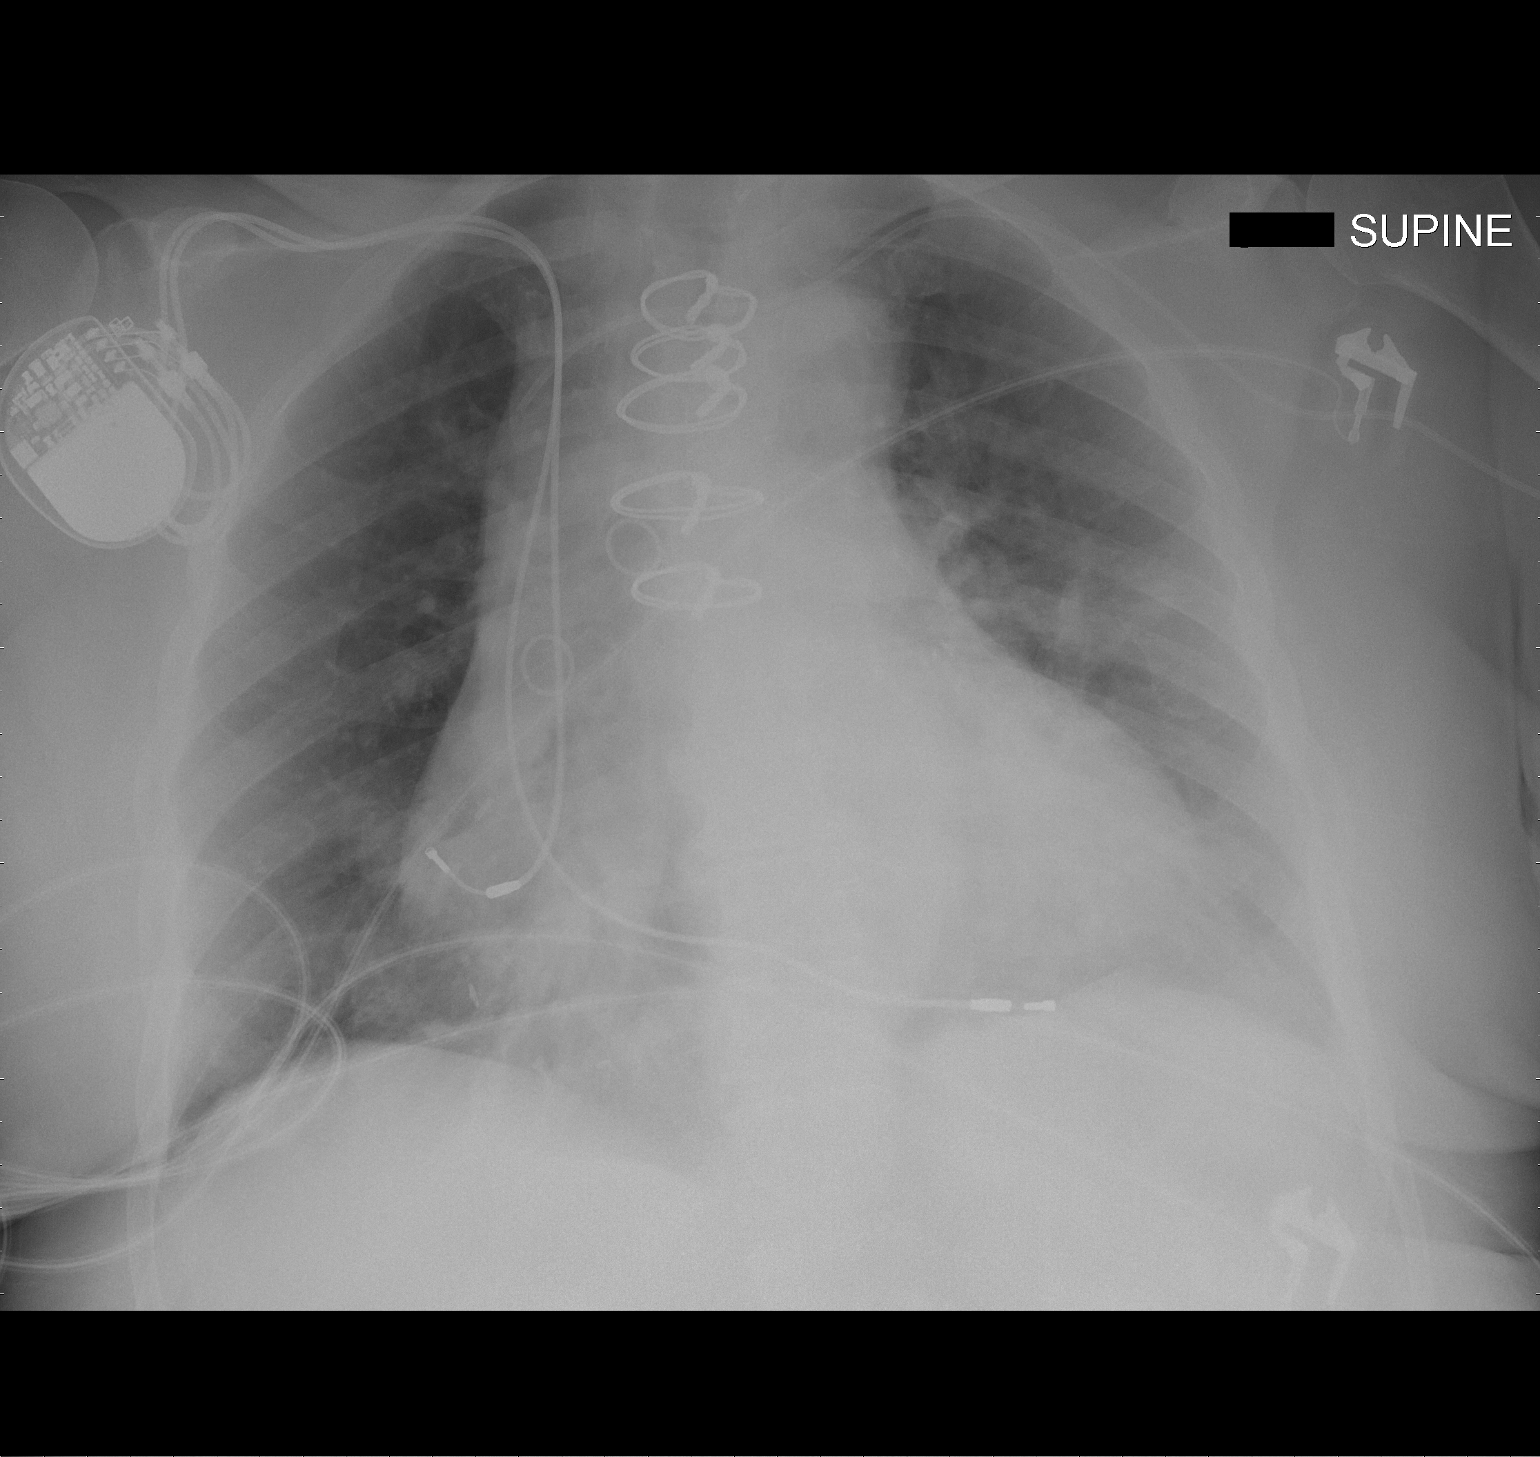

[1 of 1 positions shown; findings below may reference images not displayed]

FINDINGS: Portable view of the chest demonstrates a right cardiac
pacemaker and sternotomy wires.  The most inferior sternal wires
have been removed since the prior examination.  Cardiac silhouette
remains enlarged.  There is improved aeration in the left lung base
compared the prior examination.  There may be residual pleural
fluid and atelectasis in the left lung base. Left subclavian
central line terminates in the proximal SVC.
IMPRESSION: Improved aeration in the left lung base.

Removal of sternotomy wires.

## 2010-12-23 IMAGING — CR DG CHEST 1V PORT
1 series · 1 of 1 positions shown · non-contrast
Comparison: Chest 06/20/2009

CLINICAL DATA: Fever postoperative wound infection.

PORTABLE CHEST - 1 VIEW

[AP]
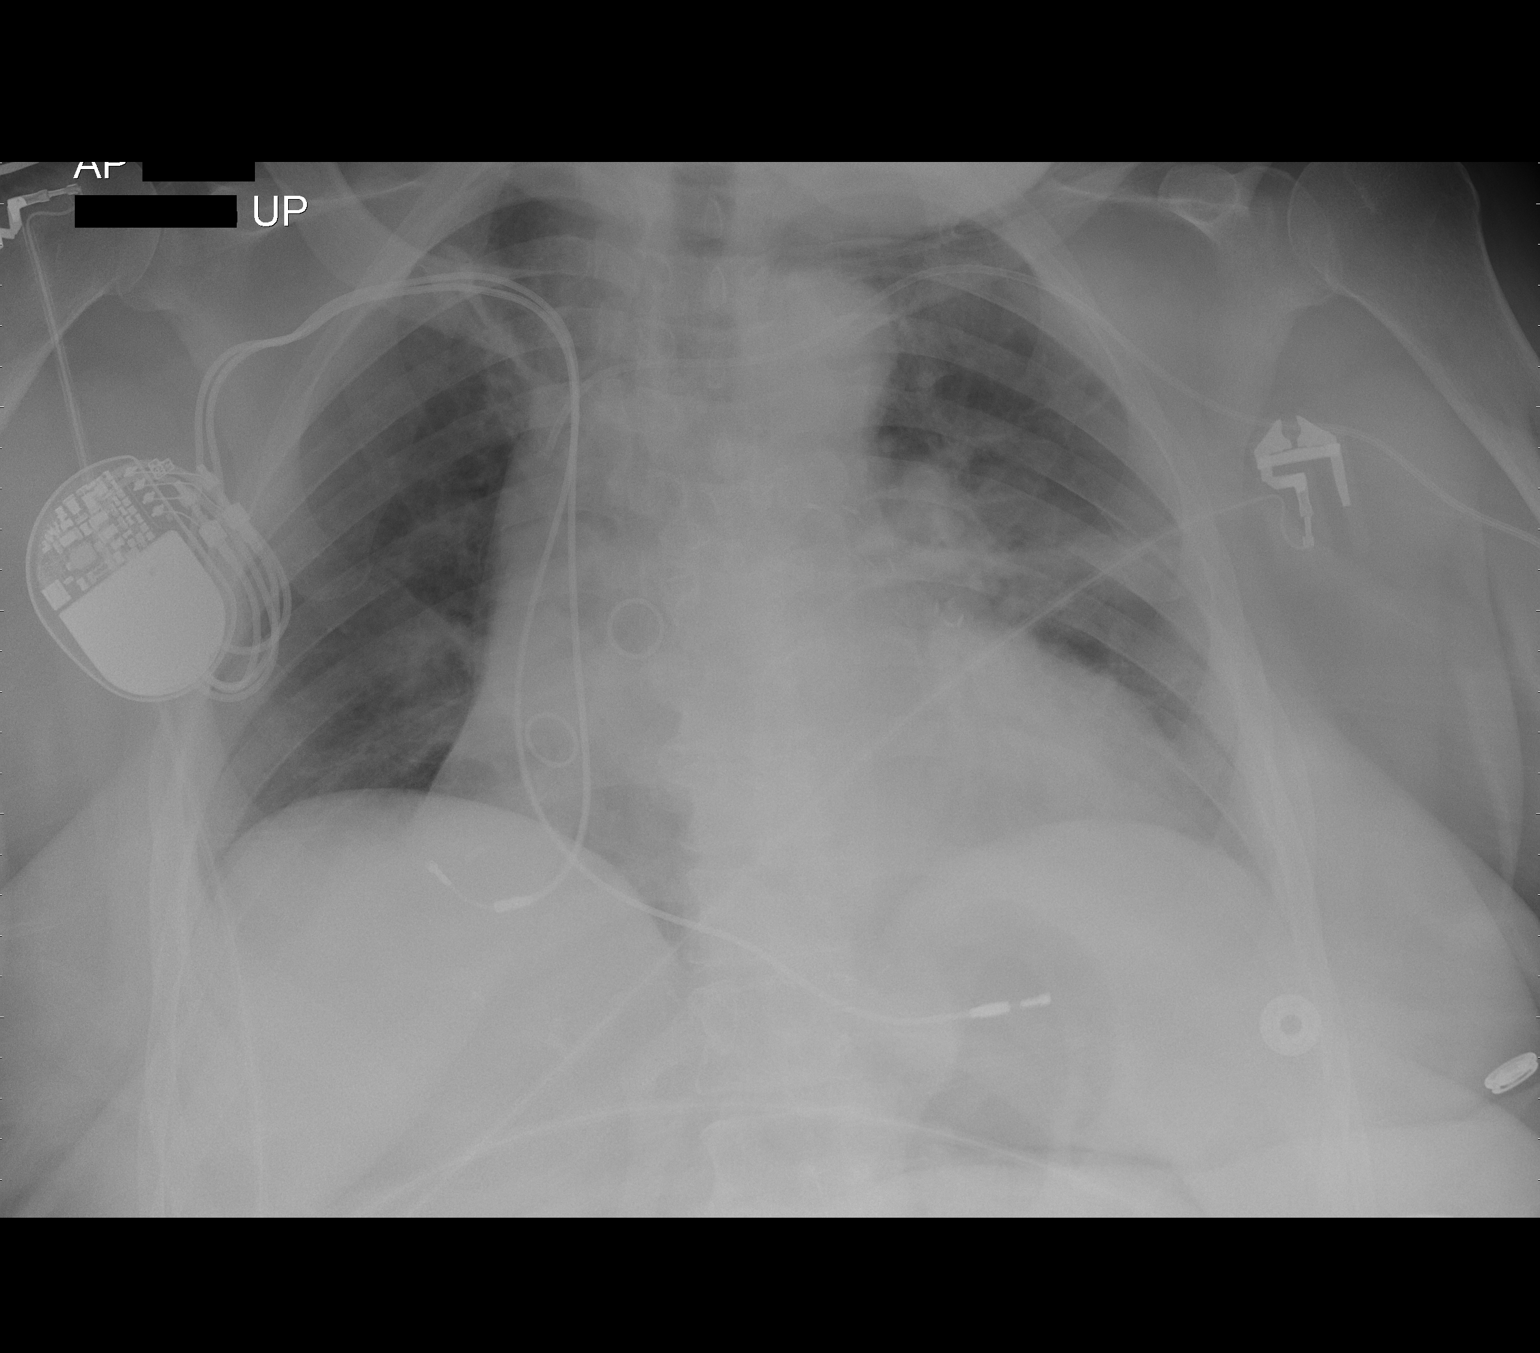

[1 of 1 positions shown; findings below may reference images not displayed]

FINDINGS: There is cardiomegaly and pulmonary vascular congestion.
Airspace disease in the left lung base has improved.  No focal
process on the right.
IMPRESSION: Improving left basilar aeration.  No other change.

## 2010-12-24 ENCOUNTER — Encounter (INDEPENDENT_AMBULATORY_CARE_PROVIDER_SITE_OTHER): Payer: Self-pay | Admitting: *Deleted

## 2010-12-24 ENCOUNTER — Ambulatory Visit: Admit: 2010-12-24 | Payer: Self-pay | Admitting: Adult Health

## 2010-12-24 IMAGING — CR DG CHEST 1V PORT
1 series · 1 of 1 positions shown · non-contrast
Comparison: 06/26/2009.

CLINICAL DATA: Fever.  Wound infection.

PORTABLE CHEST - 1 VIEW

[view not recorded]
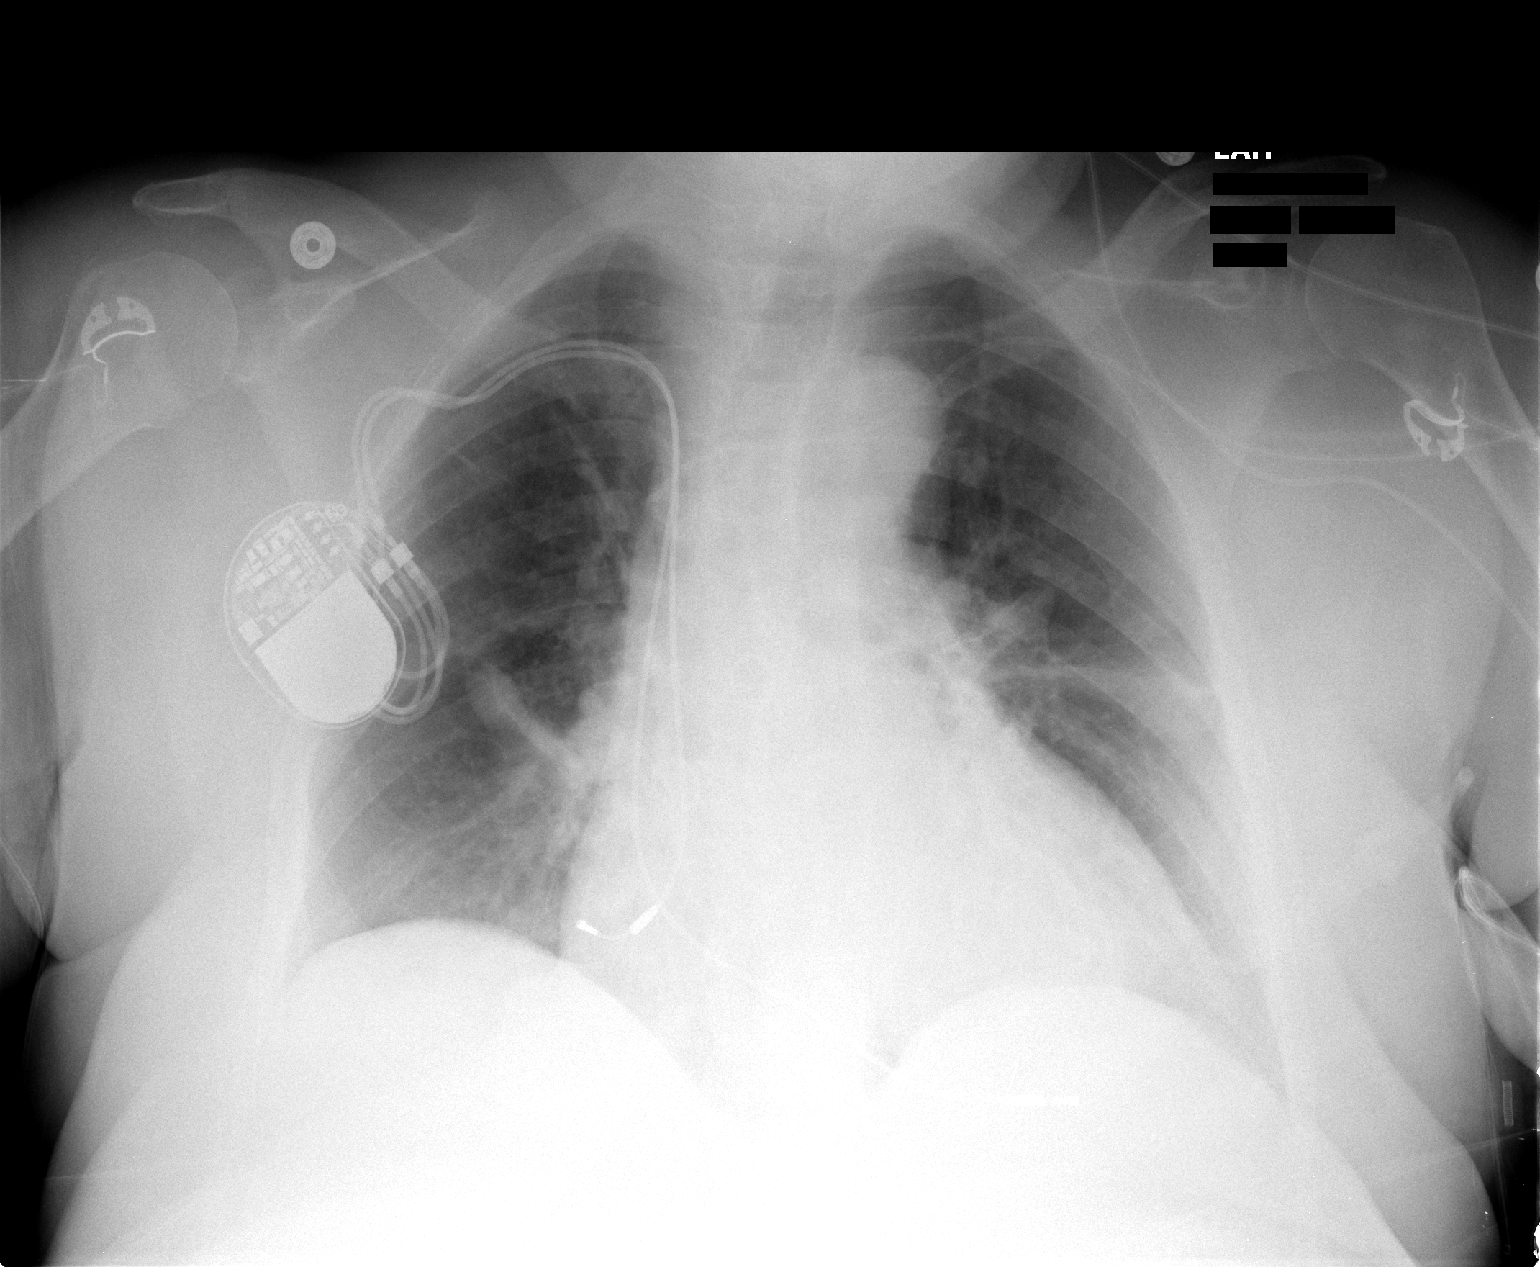

[1 of 1 positions shown; findings below may reference images not displayed]

FINDINGS: Improved lung volumes since the prior study.  There
remains bibasilar atelectasis.  The heart is enlarged.  There is no
heart failure or edema.  There is no pleural effusion.  Dual lead
pacemaker is unchanged.
IMPRESSION: Improved lung volume.  There is mild bibasilar atelectasis.

## 2010-12-25 IMAGING — CR DG ABD PORTABLE 1V
1 series · 1 of 1 positions shown · non-contrast
Comparison: Portable exam 2072 hours compared to 08/18/2006

CLINICAL DATA: Vomiting

ABDOMEN - 1 VIEW

[AP]
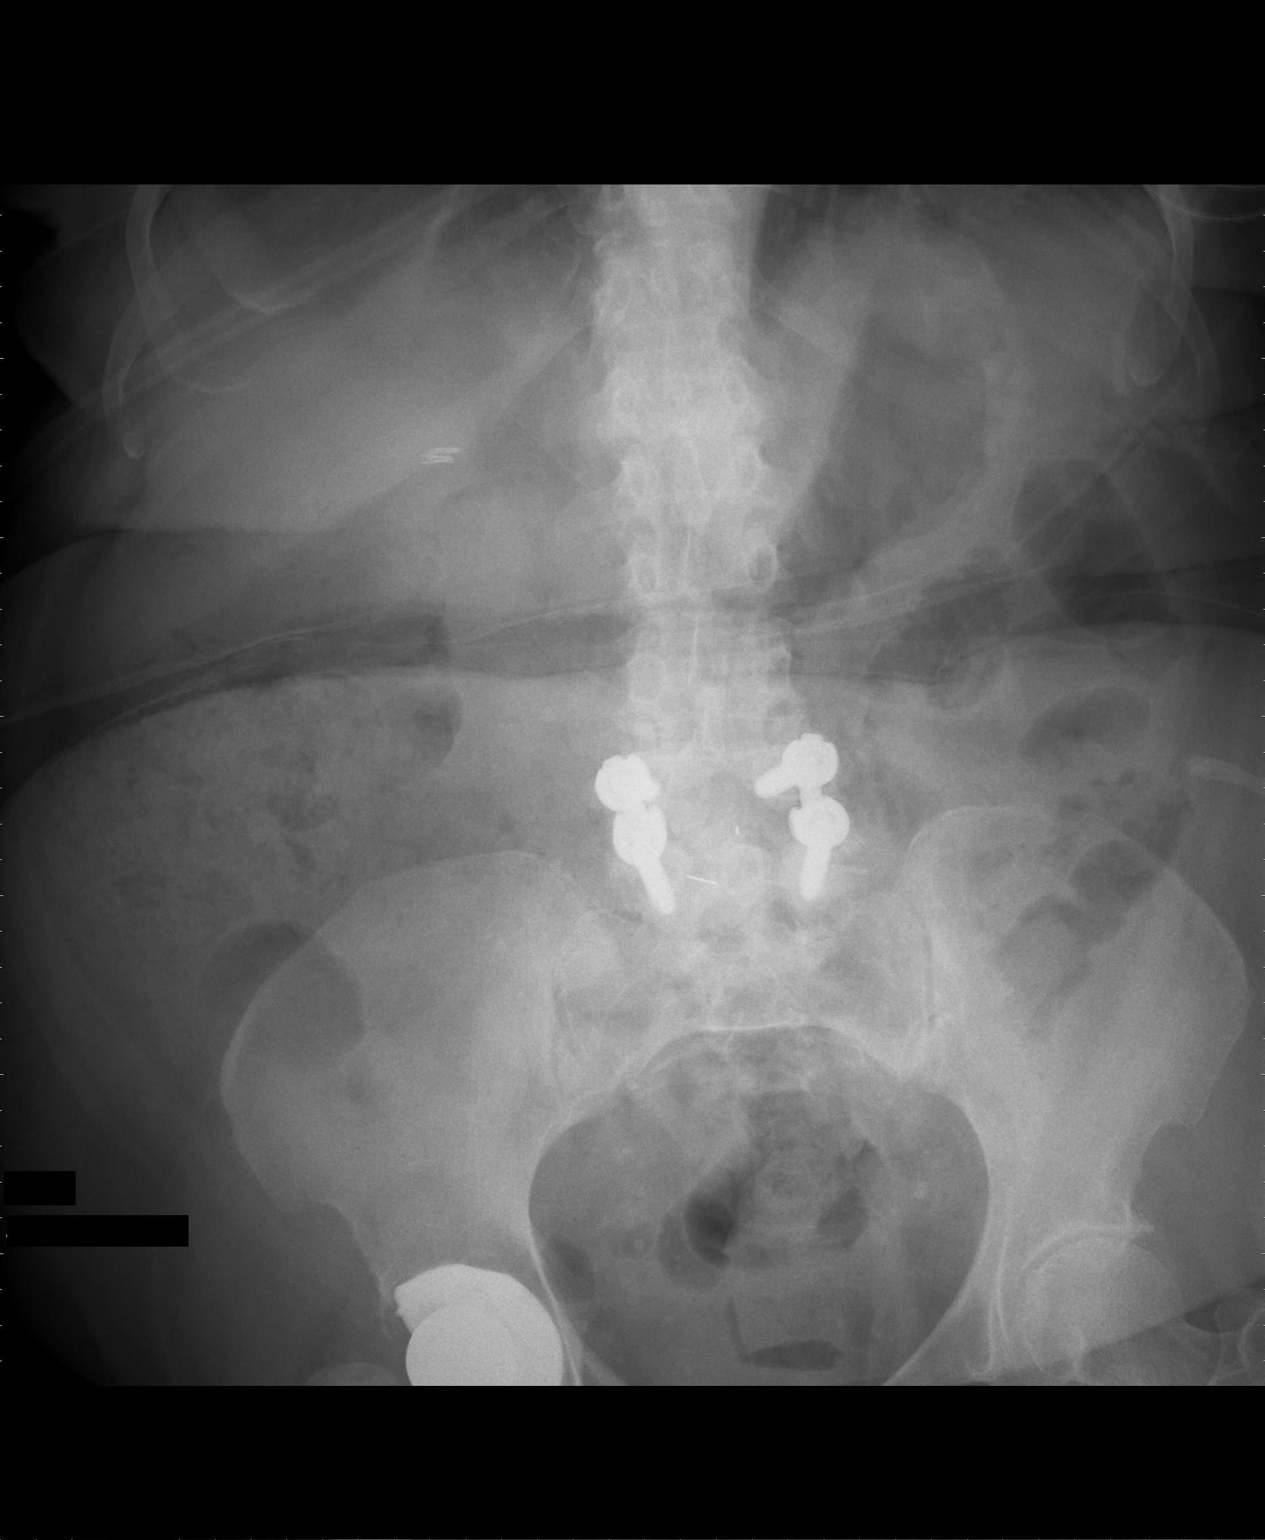

[1 of 1 positions shown; findings below may reference images not displayed]

FINDINGS: Status post L4-L5 fusion.
Surgical clips right upper quadrant question cholecystectomy.
Right hip prosthesis.
Normal bowel gas pattern.
No bowel dilatation or evidence of obstruction.
Bony demineralization.
IMPRESSION: Benign bowel gas pattern.

## 2010-12-26 ENCOUNTER — Encounter: Payer: Self-pay | Admitting: Neurological Surgery

## 2010-12-26 ENCOUNTER — Encounter: Payer: Self-pay | Admitting: Pulmonary Disease

## 2010-12-26 ENCOUNTER — Encounter: Payer: Self-pay | Admitting: Internal Medicine

## 2010-12-27 ENCOUNTER — Encounter: Payer: Self-pay | Admitting: Thoracic Surgery (Cardiothoracic Vascular Surgery)

## 2010-12-27 IMAGING — CR DG CHEST 1V PORT
1 series · 1 of 1 positions shown · non-contrast
Comparison: June 27, 2009

CLINICAL DATA: Fever, postoperative wound infection

PORTABLE CHEST - 1 VIEW

[view not recorded]
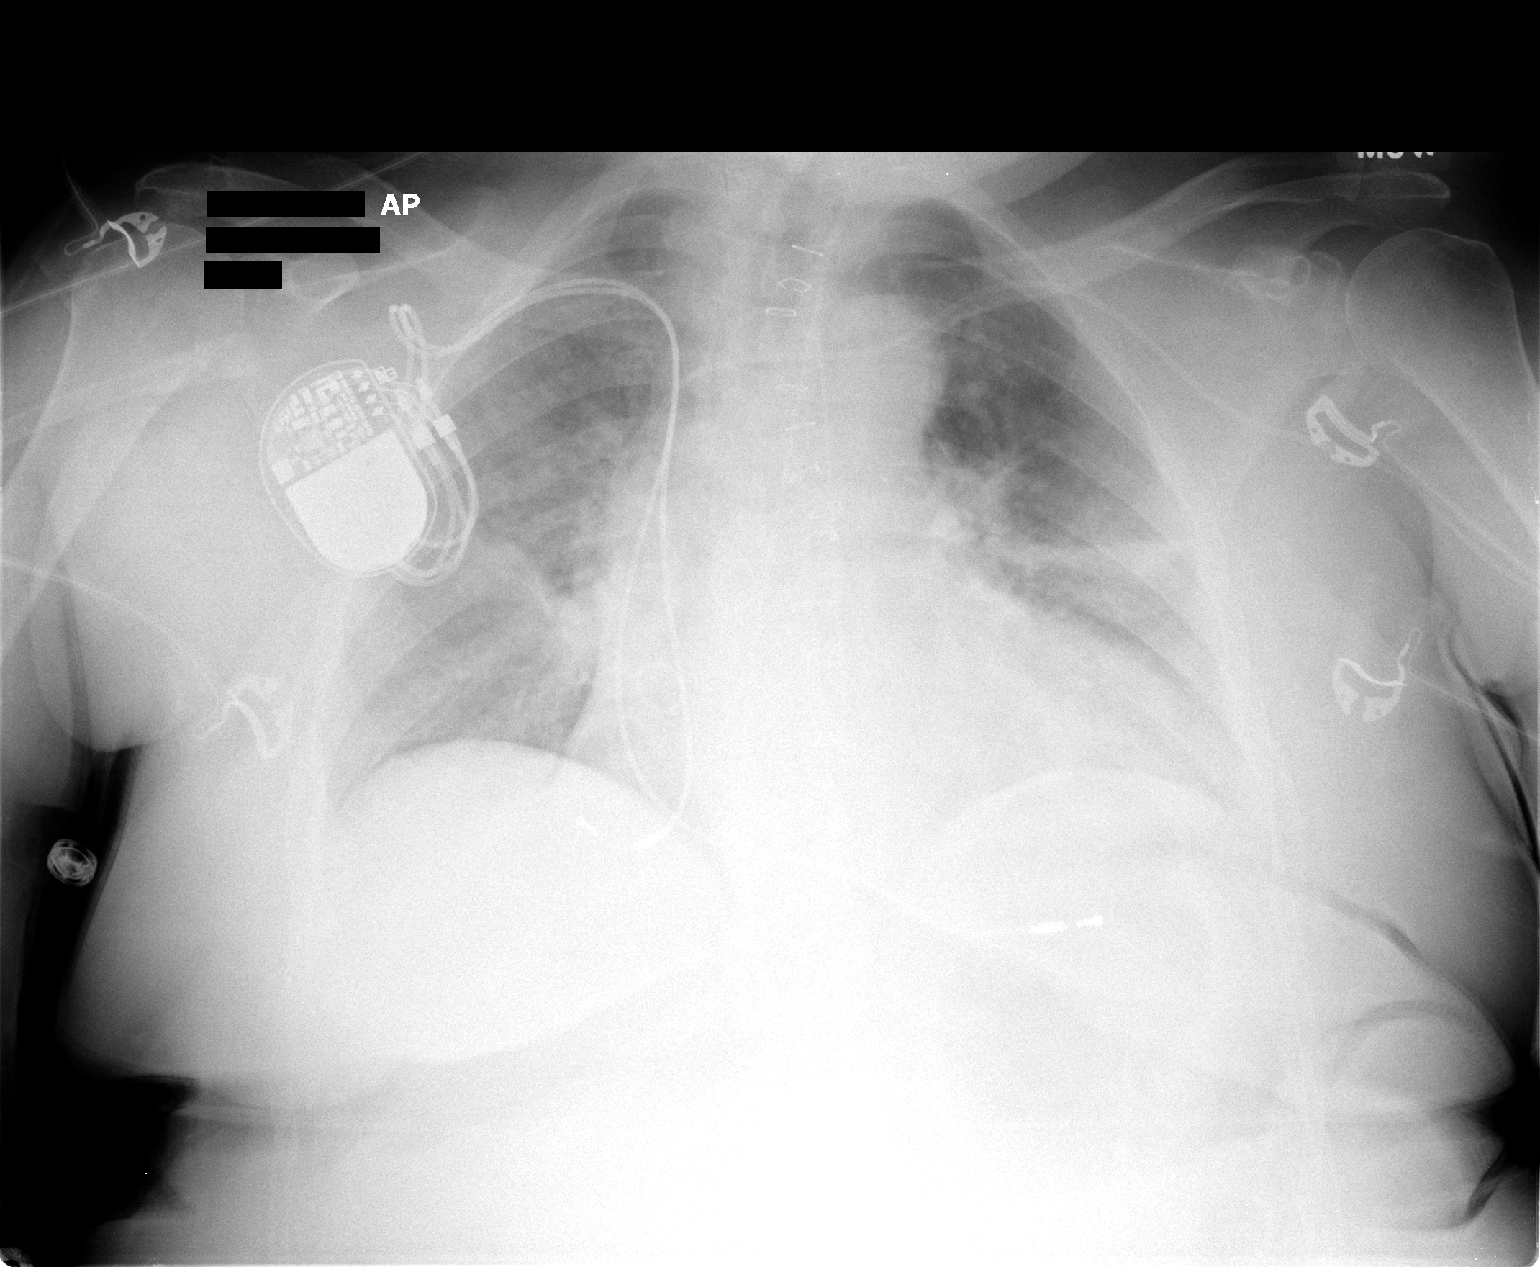

[1 of 1 positions shown; findings below may reference images not displayed]

FINDINGS: Cardiomegaly and interstitial edema appear unchanged,
given the low lung volumes today.  The pacemaker lead tips and left
PICC line are stable in position.  Bilateral mid lung atelectasis
is unchanged.  There is now a nasogastric tube which extends off
the inferior film.
IMPRESSION: No significant change in cardiomegaly, interstitial edema, and
bilateral mid lung atelectasis.

## 2010-12-30 ENCOUNTER — Ambulatory Visit: Admit: 2010-12-30 | Payer: Self-pay | Admitting: Cardiology

## 2010-12-31 ENCOUNTER — Encounter (INDEPENDENT_AMBULATORY_CARE_PROVIDER_SITE_OTHER): Payer: Self-pay | Admitting: *Deleted

## 2011-01-02 LAB — CONVERTED CEMR LAB
Basophils Absolute: 0.1 10*3/uL (ref 0.0–0.1)
CO2: 27 meq/L (ref 19–32)
Calcium: 9.3 mg/dL (ref 8.4–10.5)
Chloride: 110 meq/L (ref 96–112)
Creatinine, Ser: 0.7 mg/dL (ref 0.4–1.2)
GFR calc non Af Amer: 106.43 mL/min (ref >60)
Glucose, Bld: 82 mg/dL (ref 70–99)
Lymphocytes Relative: 26.7 % (ref 12.0–46.0)
Monocytes Relative: 7.7 % (ref 3.0–12.0)
Platelets: 156 10*3/uL (ref 150.0–400.0)
Potassium: 4.1 meq/L (ref 3.5–5.1)
Pro B Natriuretic peptide (BNP): 254 pg/mL — ABNORMAL HIGH (ref 0.0–100.0)
Prothrombin Time: 11.2 s (ref 10.9–13.3)
RDW: 15.8 % — ABNORMAL HIGH (ref 11.5–14.6)
Sodium: 144 meq/L (ref 135–145)
aPTT: 21.3 s — ABNORMAL LOW (ref 21.7–28.8)

## 2011-01-04 NOTE — Discharge Summary (Signed)
[name], [name]                ACCOUNT NO.:  [id number]  MEDICAL RECORD NO.:  [id number]          PATIENT TYPE:  INP  LOCATION:  2029                         FACILITY:  [facility]  PHYSICIAN:  Veverly Fells. Excell Seltzer, MD  DATE OF BIRTH:  Dec 04, 1941  DATE OF ADMISSION:  12/06/2010 DATE OF DISCHARGE:  12/08/2010                              DISCHARGE SUMMARY   PRIMARY CARDIOLOGIST:  Gerrit Friends. Dietrich Pates, MD, [facility]  PRIMARY CARE PROVIDER:  Oneal Deputy. Juanetta Gosling, MD  DISCHARGE DIAGNOSIS:  Chest pain without objective evidence of ischemia.  SECONDARY DIAGNOSES: 1. Coronary artery disease, status post prior coronary artery bypass     grafting with 3/3 patent grafts by catheterization this admission. 2. 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 3. Hypertension. 4. Seizure disorder. 5. History of sternal wound infection following bypass grafting. 6. Gastroesophageal reflux disease. 7. Hyperlipidemia. 8. Obesity. 9. 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 10.Status post hip replacement. 11.Status post hysterectomy. 12.Status post left total knee arthroplasty.  ALLERGIES: 1. CODEINE. 2. OXYCONTIN. 3. OXYCODONE.  PROCEDURES:  Left heart cardiac catheterization revealing significant native three-vessel coronary artery disease with patent vein graft to the PDA, patent vein graft to the OM, and patent LIMA to the LAD, EF was 60%.  HISTORY OF PRESENT ILLNESS:  A 70 year old female with prior history of coronary artery disease, status post coronary artery bypass grafting x3 in June 2010, who presented to [facility] on December 03, 2010, with complaints of a 2-day history of intermittent substernal chest discomfort.  She had an episode that woke her from sleep prompting her to present to the ED via EMS.  There, she was admitted, subsequently seen by [facility] in [place] with  recommendation for catheterization.  The patient was transferred to [facility] for further evaluation.  HOSPITAL COURSE:  The patient arrived at [facility] on December 06, 2010, and cardiac markers remained negative.  She underwent diagnostic catheterization this morning revealing significant native multivessel CAD, but with 3/3 patent grafts including a vein graft to the PDA, vein graft to the obtuse marginal, LIMA to the LAD.  EF was 60%.  Continue medical therapy is recommended.  The patient will be discharged home this evening in good condition.  DISCHARGE LABS:  Hemoglobin 12.1, hematocrit 37.3, WBC 8.0, platelets 170.  Sodium 141, potassium 4.2, chloride 109, CO2 23, BUN 10, creatinine 0.79, glucose 126, calcium 9.7.  Cardiac markers were negative x9.  Urinalysis was negative.  Fecal occult blood was negative. Urine culture was negative.  DISPOSITION:  The patient is being discharged home today in good condition.  FOLLOWUP PLANS AND APPOINTMENTS:  The patient will follow up Dr. Dietrich Pates in approximately 2 weeks and with Dr. Juanetta Gosling as scheduled.  DISCHARGE MEDICATIONS: 1. Nitroglycerin 0.4 mg sublingual p.r.n. chest pain. 2. Amlodipine 10 mg daily. 3. Alprazolam 2 mg b.i.d. 4. Aspirin 325 mg daily. 5. Benazepril 20 mg daily. 6. Dilantin 100 mg three capsules nightly. 7. Gabapentin 100 mg b.i.d. 8. Hydrocodone/APAP 5/325 mg q.4 h. p.r.n. 9. Lopressor  25 mg b.i.d. 10.Metformin XR 5 mg one tablet daily to be resume on December 11, 2010. 11.Nabumetone 500 mg daily. 12.Nexium 40 mg daily. 13.Patanol ophthalmic 0.1% one drop nightly. 14.Reglan 5 mg q.i.d. with meals. 15.Simvastatin 40 mg nightly.  OUTSTANDING LAB STUDIES:  None.  DURATION OF DISCHARGE ENCOUNTER:  Thirty five minutes including physician time.     Nicolasa Ducking, ANP   ______________________________ Veverly Fells. Excell Seltzer, MD    CB/MEDQ  D:  12/08/2010  T:  12/09/2010  Job:  [id number]  cc:   Ramon Dredge L.  Juanetta Gosling, M.D.  Electronically Signed by Nicolasa Ducking ANP on 12/27/2010 03:44:32 PM Electronically Signed by Tonny Bollman MD on 01/04/2011 04:46:56 AM

## 2011-01-05 ENCOUNTER — Ambulatory Visit: Payer: Self-pay | Admitting: Physician Assistant

## 2011-01-06 NOTE — Consult Note (Signed)
Summary: [facility]   [facility]   Imported By: Roderic Ovens 01/26/2010 14:11:58  _____________________________________________________________________  External Attachment:    Type:   Image     Comment:   External Document

## 2011-01-06 NOTE — Assessment & Plan Note (Signed)
Summary: knee pain/frs   Visit Type:  Follow-up Referring Provider:  Dr Juanetta Gosling  Primary Provider:  Kari Baars, MD  CC:  medial knee pain .  History of Present Illness: I saw [name] in the office today for a followup visit.  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 Metta Clines at [facility].   MEDS: Vicodin, Relafen and Neurontin are taken for back and knee pain, uses cane right hand.  05/05/09 Xray  spine for review [facility].  Xrays taken in our office of the left knee 11/11/09.  Complaints:  pain anterior knee, has swelling of the knee, pain goes to the hip sometimes, had back surgery a year ago Dr. Yetta Barre in [place]. She saw him last month is going for Xray 12/28/08. No changes in meds. Has some numbness left leg. Has wekaness left leg.  Today, scheduled for: Is having left knee pain.  Review of systems she is still having pain in the LEFT knee area, which radiates occasionally into the LEFT hip and buttock area. She is also having medial pain over the LEFT knee and hip has anserine bursa.  Exam This is a heavyset female with ambulation with a cane altered gait pattern and LEFT. Mood and affect is normal. She is oriented x3.  She has good extension power today in the LEFT knee. Tenderness over the medial pedis bursa. Stable. Anterior drawer test. Skin intact healed extension power normal pulse, normal sensation, normal.  Assessment bursitis, LEFT knee.  Recommended injection, LEFT knee    Allergies: 1)  ! * Codiene 2)  ! * Oxycontin 3)  ! * Oxycodone   Impression & Recommendations:  Problem # 1:  TOTAL KNEE FOLLOW-UP (ICD-V43.65) Assessment Comment Only  stable  Orders: Est. Patient Level III (16109)  Problem # 2:  ANSERINE BURSITIS, LEFT (ICD-726.61) Assessment: New  Verbal consent was obtained. The knee was prepped with alcohol and ethyl chloride. 1 cc of depomedrol 40mg /cc and 4 cc of  lidocaine 1% was injected. there were no complications. [bursa]  Orders: Est. Patient Level III (60454) Joint Aspirate / Injection, Large (20610) Depo- Medrol 40mg  (J1030)  Patient Instructions: 1)  You have received an injection of cortisone today. You may experience increased pain at the injection site. Apply ice pack to the area for 20 minutes every 2 hours and take 2 xtra strength tylenol every 8 hours. This increased pain will usually resolve in 24 hours. The injection will take effect in 3-10 days.  2)  return if still having pain

## 2011-01-06 NOTE — Assessment & Plan Note (Signed)
Summary: pain around pacer site/mt   Referring Provider:  Dr Juanetta Gosling  Primary Provider:  Kari Baars, MD  CC:  chest pain.Marland Kitchenalso sob and dizziness.  History of Present Illness: [name] is referred today by Dr. Juanetta Gosling for evaluation of her PPM pocket.  The patient has a h/o bradycardia and last underwent PPM insertion in 2003.  She underwent CABG in 2010 complicated by a sternal wound infection which required a muscular flap.  Since then, she has been stable but she has chronic dyspnea.  She c/o subjective fevers/chills but has not documented any high fevers.  She notes that when she moves a certain way, it seems like her PPM is swollen and "puffy".  No other complaints today.  Current Medications (verified): 1)  Patanol 0.1 % Soln (Olopatadine Hcl) .Marland Kitchen.. 1 Drop Rt Eye Each Night 2)  Neurontin 100 Mg Caps (Gabapentin) .Marland Kitchen.. 1 Tab Three Times A Day 3)  Dilantin 100 Mg Caps (Phenytoin Sodium Extended) .... Take 3 By Mouth Once Daily 4)  Protonix 40 Mg Solr (Pantoprazole Sodium) .Marland Kitchen.. 1 1tab Once Daily 5)  Metoclopramide Hcl 5 Mg Tabs (Metoclopramide Hcl) .... Take 2 By Mouth Two Times A Day 6)  Nexium 40 Mg Cpdr (Esomeprazole Magnesium) .... Take 1 By Mouth Once Daily 7)  Simvastatin 40 Mg Tabs (Simvastatin) .Marland Kitchen.. 1 Tab Once Daily 8)  Alprazolam 2 Mg Tabs (Alprazolam) .... Take 1 By Mouth Three Times A Day 9)  Lotrel 10-20 Mg Caps (Amlodipine Besy-Benazepril Hcl) .... One Tab By Mouth Once Daily 10)  Estropipate0.625 .Marland KitchenMarland Kitchen. 1 Daily 11)  Aspirin Ec 325 Mg Tbec (Aspirin) .... Take One Tablet By Mouth Daily 12)  Vicodin 5-500 Mg Tabs (Hydrocodone-Acetaminophen) .... As Needed For Pain 13)  Nabumetone 500 Mg Tabs (Nabumetone) .... One By Mouth Once A Day 14)  Toprol Xl 25 Mg Xr24h-Tab (Metoprolol Succinate) .... One Tablet Daily 15)  Furosemide 20 Mg Tabs (Furosemide) .... One Tablet Daily 16)  Potassium Chloride Cr 10 Meq Cr-Caps (Potassium Chloride) .... Take One Tablet By Mouth  Daily  Allergies: 1)  ! * Codiene 2)  ! * Oxycontin 3)  ! * Oxycodone  Past History:  Past Medical History: Last updated: 02/22/2010 1. 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 Hypertension 3. Seizure disorder 4. 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  Myoview (2/11) showed EF 62%, fixed septal defect c/w breast attenuation, minimal inferolateral reversible defect (attenuation versus mild ischemia).  Overall low risk study.  5. Sternal wound Infection Rx flap 07/2009 6. Gastroesophageal reflux disease 7. G E R D 8. Hyperlipidemia 9. Remote right arm DVT 10. Diastolic CHF: echo (2/11) mild to moderate LVH with EF 65-70%, normal wall motion, RV normal.  11. Obesity 12. Degenerative disc disease.   Past Surgical History: Last updated: 01/26/2010 Revision of lumbar wound Back surgery Hip replacement Hysterectomy Knee replacement - left Pacemaker insertion Thyroid surgery  Wrist surgery CABG 6/10 - LIMA to LAD, SVG to OM, SVG to PDA  Family History: Last updated: 01/26/2010 Family History of Diabetes Family History Coronary Heart Disease female < 94  Social History: Last updated: 01/26/2010 No caffeine Married  Tobacco Use - No Alcohol Use - no  Review of Systems       All systems reviewed and negative except as noted in the HPI.  Vital Signs:  Patient profile:   70 year old female Height:  62 inches Weight:      224 pounds BMI:     41.12 Pulse rate:   70 / minute Resp:     18 per minute BP sitting:   120 / 68  (left arm)  Vitals Entered By: Kem Parkinson (September 29, 2010 2:05 PM)  Physical Exam  General:  Chronically ill-appearing Head:  normocephalic and atraumatic Eyes:  Wearing glasses.  Sclera are clear. Neck:  Neck supple, JVP difficult.  No masses, thyromegaly or abnormal cervical nodes. Chest Wall:  Well healed PPM incision. Lungs:  Slight  crackles at the bases.  Heart:  Non-displaced PMI, chest non-tender; regular rate and rhythm, S1, S2 without rubs or gallops.  2/6 early SEM RUSB.  Carotid upstroke normal, no bruit.  Pedals normal pulses. 1+ edema 1/2 up lower legs bilaterally.  Abdomen:  Bowel sounds positive; abdomen soft and non-tender without masses, organomegaly, or hernias noted. No hepatosplenomegaly. Msk:  Back normal, normal gait. Muscle strength and tone normal. Pulses:  pulses normal in all 4 extremities Extremities:  No clubbing or cyanosis. Neurologic:  Alert and oriented x 3.   PPM Specifications Following MD:  Everardo Beals. Juanda Chance, MD     PPM Vendor:  Medtronic     PPM Model Number:  [id number]     PPM Serial Number:  [id number] PPM DOI:  11/01/2002     PPM Implanting MD:  Everardo Beals. Juanda Chance, MD  Lead 1    Location: [facility]     DOI: 07/16/1992     Model #: [id number]     Serial #: [id number]     Status: active Lead 2    Location: [facility]     DOI: 07/16/1992     Model #: [id number]     Serial #: [id number]     Status: active  Magnet Response Rate:  BOL 85 ERI  65  Indications:  Syncope   PPM Follow Up Pacer Dependent:  No      Episodes Coumadin:  No  Parameters Mode:  DDDR     Lower Rate Limit:  60     Upper Rate Limit:  110 Paced AV Delay:  230     Sensed AV Delay:  230  Impression & Recommendations:  Problem # 1:  PACEMAKER (ICD-V45.Marland Kitchen01) Her device appears to be working normally and I see no obvious evidence of active infection.  I have recommended a period of watchful waiting and have asked her to call us if the pocket become red, tender or drains.  Problem # 2:  CAD, AUTOLOGOUS BYPASS GRAFT (ICD-414.02) She denies anginal symptoms but does complain of dyspnea which has been chronic. Her updated medication list for this problem includes:    Lotrel 10-20 Mg Caps (Amlodipine besy-benazepril hcl) ..... One tab by mouth once daily    Aspirin Ec 325 Mg Tbec (Aspirin) .Marland Kitchen... Take one tablet by mouth daily    Toprol Xl 25 Mg Xr24h-tab  (Metoprolol succinate) ..... One tablet daily  Problem # 3:  HYPERTENSION, BENIGN (ICD-401.1) Her blood pressure today is well controlled.  Continue meds as below and maintain a low sodium diet. Her updated medication list for this problem includes:    Lotrel 10-20 Mg Caps (Amlodipine besy-benazepril hcl) ..... One tab by mouth once daily    Aspirin Ec 325 Mg Tbec (Aspirin) .Marland Kitchen... Take one tablet by mouth daily    Toprol Xl 25 Mg Xr24h-tab (Metoprolol succinate) ..... One tablet daily    Furosemide 20 Mg Tabs (Furosemide) .Marland KitchenMarland KitchenMarland KitchenMarland Kitchen  One tablet daily

## 2011-01-06 NOTE — Cardiovascular Report (Signed)
Summary: Office Visit   Office Visit   Imported By: Roderic Ovens 07/23/2010 13:46:49  _____________________________________________________________________  External Attachment:    Type:   Image     Comment:   External Document

## 2011-01-06 NOTE — Miscellaneous (Signed)
Summary: hospital labs 01/11/2010  Clinical Lists Changes  Observations: Added new observation of CALCIUM: 8.8 mg/dL (57/84/6962 95:28) Added new observation of GFR AA: >60 mL/min/1.39m2 (01/11/2010 11:30) Added new observation of GFR: >60 mL/min (01/11/2010 11:30) Added new observation of CREATININE: 0.67 mg/dL (41/32/4401 02:72) Added new observation of BUN: 15 mg/dL (53/66/4403 47:42) Added new observation of BG RANDOM: 124 mg/dL (59/56/3875 64:33) Added new observation of CO2 PLSM/SER: 28 meq/L (01/11/2010 11:30) Added new observation of CL SERUM: 104 meq/L (01/11/2010 11:30) Added new observation of K SERUM: 3.5 meq/L (01/11/2010 11:30) Added new observation of NA: 140 meq/L (01/11/2010 11:30)

## 2011-01-06 NOTE — Assessment & Plan Note (Signed)
Summary: rov   Referring Provider:  Dr Juanetta Gosling  Primary Provider:  Kari Baars, MD   History of Present Illness: The patient is 70 years old and return for followup management of CHF and for final clearance prior to back surgery. She had bypass surgery in June of 2010 and this was complicated by a sternal wound infection requiring a flap. She finally improved but was admitted to the hospital in February of 2011 with chest pain and subsequently had a negative Myoview scan. She was seen last week for preoperative evaluation by Dr. Shirlee Latch and he thought she had some mild diastolic heart failure and prescribe Lasix 20 mg daily and arrange for her to come back today. She has had an echo which shows an ejection fraction of 65%.  She says she's done fairly well since last week. She may have lost a little bit of fluid. Her weight is down 2 pounds from her weight a week ago. She has had no recent chest pain. She still has shortness of breath with exertion.  Her other problems include hypertension, hyperlipidemia and seizure disorder. She also has had a DDD pacemaker implanted for AV block and had a generator change in 2003.  Current Medications (verified): 1)  Patanol 0.1 % Soln (Olopatadine Hcl) .Marland Kitchen.. 1 Drop Rt Eye Each Night 2)  Neurontin 100 Mg Caps (Gabapentin) .Marland Kitchen.. 1 Tab Three Times A Day 3)  Dilantin 100 Mg Caps (Phenytoin Sodium Extended) .... Take 2 By Mouth Once Daily 4)  Protonix 40 Mg Solr (Pantoprazole Sodium) .Marland Kitchen.. 1 1tab Once Daily 5)  Metoclopramide Hcl 5 Mg Tabs (Metoclopramide Hcl) .... Take 2 By Mouth Two Times A Day 6)  Nexium 40 Mg Cpdr (Esomeprazole Magnesium) .... Take 1 By Mouth Once Daily 7)  Simvastatin 40 Mg Tabs (Simvastatin) .Marland Kitchen.. 1 Tab Once Daily 8)  Alprazolam 2 Mg Tabs (Alprazolam) .... Take 1 By Mouth Three Times A Day 9)  Lotrel 10-20 Mg Caps (Amlodipine Besy-Benazepril Hcl) .... One Tab By Mouth Once Daily 10)  Estropipate0.625 .Marland KitchenMarland Kitchen. 1 Daily 11)  Aspirin Ec 325 Mg  Tbec (Aspirin) .... Take One Tablet By Mouth Daily 12)  Vicodin 5-500 Mg Tabs (Hydrocodone-Acetaminophen) .... As Needed For Pain 13)  Nabumetone 500 Mg Tabs (Nabumetone) .... One By Mouth Once A Day 14)  Toprol Xl 25 Mg Xr24h-Tab (Metoprolol Succinate) .... One Tablet Daily 15)  Furosemide 20 Mg Tabs (Furosemide) .... One Tablet Daily 16)  Potassium Chloride Cr 10 Meq Cr-Caps (Potassium Chloride) .... Take One Tablet By Mouth Daily  Allergies (verified): 1)  ! * Codiene 2)  ! * Oxycontin 3)  ! * Oxycodone  Past History:  Past Medical History: Reviewed history from 02/22/2010 and no changes required. 1. 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 Hypertension 3. Seizure disorder 4. 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  Myoview (2/11) showed EF 62%, fixed septal defect c/w breast attenuation, minimal inferolateral reversible defect (attenuation versus mild ischemia).  Overall low risk study.  5. Sternal wound Infection Rx flap 07/2009 6. Gastroesophageal reflux disease 7. G E R D 8. Hyperlipidemia 9. Remote right arm DVT 10. Diastolic CHF: echo (2/11) mild to moderate LVH with EF 65-70%, normal wall motion, RV normal.  11. Obesity 12. Degenerative disc disease.   Review of Systems       ROS is negative except as outlined in HPI.   Vital Signs:  Patient  profile:   70 year old female Height:      62 inches Weight:      222 pounds BMI:     40.75 Pulse rate:   64 / minute Resp:     18 per minute BP sitting:   148 / 84  (right arm)  Vitals Entered By: Marrion Coy, CNA (March 02, 2010 4:21 PM)  Physical Exam  Additional Exam:  Gen. Well-nourished, in no distress   Neck: No JVD, thyroid not enlarged, no carotid bruits Lungs: No tachypnea, clear without rales, rhonchi or wheezes Cardiovascular: Rhythm regular, PMI not displaced,  heart sounds  normal, no murmurs or gallops, no  peripheral edema, pulses normal in all 4 extremities. Abdomen: BS normal, abdomen soft and non-tender without masses or organomegaly, no hepatosplenomegaly. MS: No deformities, no cyanosis or clubbing   Neuro:  No focal sns   Skin:  no lesions    PPM Specifications Following MD:  Everardo Beals. Juanda Chance, MD     PPM Vendor:  [employer]     PPM Model Number:  [id number]     PPM Serial Number:  [id number] PPM DOI:  11/01/2002     PPM Implanting MD:  Everardo Beals. Juanda Chance, MD  Lead 1    Location: [facility]     DOI: 07/16/1992     Model #: [id number]     Serial #: [id number]     Status: active Lead 2    Location: [facility]     DOI: 07/16/1992     Model #: [id number]     Serial #: [id number]     Status: active  Magnet Response Rate:  BOL 85 ERI  65  Indications:  Syncope   PPM Follow Up Pacer Dependent:  No      Episodes Coumadin:  No  Parameters Mode:  DDDR     Lower Rate Limit:  60     Upper Rate Limit:  110 Paced AV Delay:  230     Sensed AV Delay:  230  Impression & Recommendations:  Problem # 1:  PREOPERATIVE EXAMINATION (ICD-V72.84) We are seeing her for her preop evaluation prior to back surgery by Dr. Marikay Alar. She has had previous bypass surgery and has had some mild diastolic heart failure but now appears euvolemic and compensated. I think it is okay to proceed with back surgery under general anesthesia. It is okay to stop her aspirin prior to surgery. I would recommend continuing her Toprol through surgery.  Problem # 2:  CAD, AUTOLOGOUS BYPASS GRAFT (ICD-414.02)  Her updated medication list for this problem includes:    Lotrel 10-20 Mg Caps (Amlodipine besy-benazepril hcl) ..... One tab by mouth once daily    Aspirin Ec 325 Mg Tbec (Aspirin) .Marland Kitchen... Take one tablet by mouth daily    Toprol Xl 25 Mg Xr24h-tab (Metoprolol succinate) ..... One tablet daily  Her updated medication list for this problem includes:    Lotrel 10-20 Mg Caps (Amlodipine besy-benazepril hcl) ..... One tab by mouth once daily    Aspirin Ec 325  Mg Tbec (Aspirin) .Marland Kitchen... Take one tablet by mouth daily    Toprol Xl 25 Mg Xr24h-tab (Metoprolol succinate) ..... One tablet daily  Orders: EKG w/ Interpretation (93000)  Problem # 3:  PACEMAKER (ICD-V45.Marland Kitchen01) she has a DDD pacemaker and is pacer dependent. She should have a magnet available at the time of surgery to place over her pacemaker to convert her to a synchronous mode should she have any inhibition of her  pacemaker due to surgical instruments.  Problem # 4:  HYPERTENSION, BENIGN (ICD-401.1) This is recently well controlled on her current medications. Her updated medication list for this problem includes:    Lotrel 10-20 Mg Caps (Amlodipine besy-benazepril hcl) ..... One tab by mouth once daily    Aspirin Ec 325 Mg Tbec (Aspirin) .Marland Kitchen... Take one tablet by mouth daily    Toprol Xl 25 Mg Xr24h-tab (Metoprolol succinate) ..... One tablet daily    Furosemide 20 Mg Tabs (Furosemide) ..... One tablet daily  Patient Instructions: 1)  Your physician wants you to follow-up in: 6 months.  You will receive a reminder letter in the mail two months in advance. If you don't receive a letter, please call our office to schedule the follow-up appointment.

## 2011-01-06 NOTE — Letter (Signed)
Summary: History form  History form   Imported By: Jacklynn Ganong 01/08/2010 09:20:12  _____________________________________________________________________  External Attachment:    Type:   Image     Comment:   External Document

## 2011-01-06 NOTE — Assessment & Plan Note (Signed)
Summary: [facility] ER FOL UP/NEW PROB/BILAT ELBOW PAIN/XRs [facility] 12/27/09/MEDICAR...   Visit Type:  New problem Referring Provider:  Dr Juanetta Gosling  Primary Provider:  Kari Baars, MD  CC:  bilateral elbow pain.  History of Present Illness: I saw [name] in the office today for a followup visit.  She is a 70 years old woman with the complaint of:  BILATERAL ELBOW PAIN.  [name] was seen in the emergency room on January 23.  She complained of bilateral elbow pain.  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 and presents now with continued pain which is sharp stabbing severe intermittent.  It started gradually.  The prednisone does not seem to be helping.  She also has some locking and catching of the elbow.  Review of systems she has LEFT knee pain status post LEFT total knee replacement done elsewhere and has a history of degenerative disc disease lumbar spine.  She has no history of rheumatoid arthritis.  She's had no trauma to the elbow.  Pain for 3 weeks.    Allergies: 1)  ! * Codiene 2)  ! * Oxycontin 3)  ! * Oxycodone  Physical Exam  Additional Exam:  Examination she is a moderately obese female oriented x3 mood and affect are normal  Her gait and station are waddling  Her pulses and temperature are intact in the RIGHT and LEFT upper extremity.  There is no lymphadenopathy in either extremity.  Sensation is normal in both extremities.  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  Orders: Est. Patient Level IV (62952)  Problem # 2:  ARTHRITIS, UPPER ARM (ICD-716.92) Assessment: New  Orders: Est. Patient Level IV (84132)  Medications Added to Medication List This Visit: 1)  Prednisone (pak) 10 Mg Tabs (Prednisone) .... As directed  Patient Instructions: 1)  Continue prednisone  2)  return in 4 weeks  Prescriptions: PREDNISONE (PAK) 10 MG TABS (PREDNISONE) as directed  #1 x 1   Entered and Authorized by:   Fuller Canada MD   Signed by:   Fuller Canada MD on 01/04/2010   Method used:   Print then Give to Patient   RxID:   [id number]

## 2011-01-06 NOTE — Assessment & Plan Note (Signed)
Summary: surgical clearance   Referring Provider:  Dr Juanetta Gosling  Primary Provider:  Kari Baars, MD  CC:  pt needs surgical clearance for  back surgery.  She admits SOB and pain in her body from back to legs.  Pt states she is also having chest pain but believes it to be arthritis.  [name]  History of Present Illness: 70 yo patient with history of CAD s/p CABG 6/10 (complicated by sternal wound infection and flap repair), degenerative disc disease with severe back pain, and obesity presents for cardiac evaluation prior to back surgery.  Patient is usually seen by Dr. Juanda Chance.  She has significant low back pain radiating to her legs bilaterally and need L-spine decompression with instrumented fusion at L3-L4.  Patient has had frequent chest pain since her CABG.  She will typically wake up with severe chest pain, then it resolves.  This happens on and off since her CABG.  Never associated with exertion.  She does, however, have significant exertional dyspnea.  She is short of breath walking around her house.  She is also limited significantly by back pain as well.  She cannot climb steps due to dyspnea.    Patient was admitted to [facility] in 2/11 with atypical chest pain.  She ruled out for MI by enzymes.  Myoview showed EF 62% with a fixed septal defect consistent with breast artifact and a minimal reversible inferolateral defect (soft tissue attenuation vs mild ischemia).  This was a low risk study. Echo showed mild to moderate LVH with EF 65-75%.    ECG: a-paced, v-sensed  CXR (today): Clear lung fields  Labs (3/11): BNP 254, creatinine 0.8  Current Medications (verified): 1)  Patanol 0.1 % Soln (Olopatadine Hcl) .[name].. 1 Drop Rt Eye Each Night 2)  Neurontin 100 Mg Caps (Gabapentin) .[name].. 1 Tab Three Times A Day 3)  Dilantin 100 Mg Caps (Phenytoin Sodium Extended) .... Take 2 By Mouth Once Daily 4)  Protonix 40 Mg Solr (Pantoprazole Sodium) .[name].. 1 1tab Once Daily 5)  Metoclopramide Hcl 5 Mg Tabs  (Metoclopramide Hcl) .... Take 2 By Mouth Two Times A Day 6)  Nexium 40 Mg Cpdr (Esomeprazole Magnesium) .... Take 1 By Mouth Once Daily 7)  Simvastatin 40 Mg Tabs (Simvastatin) .[name].. 1 Tab Once Daily 8)  Alprazolam 2 Mg Tabs (Alprazolam) .... Take 1 By Mouth Three Times A Day 9)  Lotrel 10-20 Mg Caps (Amlodipine Besy-Benazepril Hcl) .... One Tab By Mouth Once Daily 10)  Estropipate0.625 .Christine KitchenMarland Wolf. 1 Daily 11)  Aspirin Ec 325 Mg Tbec (Aspirin) .... Take One Tablet By Mouth Daily 12)  Vicodin 5-500 Mg Tabs (Hydrocodone-Acetaminophen) .... As Needed For Pain 13)  Nabumetone 500 Mg Tabs (Nabumetone) .... One By Mouth Once A Day  Allergies (verified): 1)  ! * Codiene 2)  ! * Oxycontin 3)  ! * Oxycodone  Past History:  Past Medical History: 1. 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 Hypertension 3. Seizure disorder 4. 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  Myoview (2/11) showed EF 62%, fixed septal defect c/w breast attenuation, minimal inferolateral reversible defect (attenuation versus mild ischemia).  Overall low risk study.  5. Sternal wound Infection Rx flap 07/2009 6. Gastroesophageal reflux disease 7. G E R D 8. Hyperlipidemia 9. Remote right arm DVT 10. Diastolic CHF: echo (2/11) mild to moderate LVH with EF 65-70%, normal wall motion, RV normal.  11. Obesity  12. Degenerative disc disease.   Family History: Reviewed history from 01/26/2010 and no changes required. Family History of Diabetes Family History Coronary Heart Disease female < 42  Social History: Reviewed history from 01/26/2010 and no changes required. No caffeine Married  Tobacco Use - No Alcohol Use - no  Review of Systems       All systems reviewed and negative except as per HPI.   Vital Signs:  Patient profile:   70 year old female Height:      62 inches Weight:      224 pounds BMI:     41.12 O2 Sat:      98 % on  Room air Pulse rate:   70 / minute Pulse rhythm:   regular BP sitting:   122 / 80  (left arm) Cuff size:   large  Vitals Entered By: Judithe Modest CMA (February 22, 2010 9:38 AM)  O2 Flow:  Room air  Physical Exam  General:  Chronically ill-appearing Neck:  Neck supple, JVP difficult.  No masses, thyromegaly or abnormal cervical nodes. Lungs:  Slight crackles at the bases.  Heart:  Non-displaced PMI, chest non-tender; regular rate and rhythm, S1, S2 without rubs or gallops.  2/6 early SEM RUSB.  Carotid upstroke normal, no bruit.  Pedals normal pulses. 1+ edema 1/2 up lower legs bilaterally.  Abdomen:  Bowel sounds positive; abdomen soft and non-tender without masses, organomegaly, or hernias noted. No hepatosplenomegaly. Extremities:  No clubbing or cyanosis. Neurologic:  Alert and oriented x 3. Psych:  Normal affect.   PPM Specifications Following MD:  Everardo Beals. Juanda Chance, MD     PPM Vendor:  Medtronic     PPM Model Number:  [id number]     PPM Serial Number:  [id number] PPM DOI:  11/01/2002     PPM Implanting MD:  Everardo Beals. Juanda Chance, MD  Lead 1    Location: RA     DOI: 07/16/1992     Model #: [id number]     Serial #: [id number]     Status: active Lead 2    Location: RV     DOI: 07/16/1992     Model #: [id number]     Serial #: [id number]     Status: active  Magnet Response Rate:  BOL 85 ERI  65  Indications:  Syncope   PPM Follow Up Pacer Dependent:  No      Episodes Coumadin:  No  Parameters Mode:  DDDR     Lower Rate Limit:  60     Upper Rate Limit:  110 Paced AV Delay:  230     Sensed AV Delay:  230  Impression & Recommendations:  Problem # 1:  SHORTNESS OF BREATH (ICD-786.05) Patient has quite significant (NYHA class III symptoms) dyspnea.  This does seem to be chronic.  She does look volume overloaded on exam with elevated BNP to 254.  She has diastolic CHF.  I will have her go ahead and start Lasix 20 mg daily + KCl 20 mEq daily.    Problem # 2:  CAD, AUTOLOGOUS BYPASS GRAFT  (ICD-414.02) Stable post-CABG, no chest pain.  Continue ASA, statin, ACEI.  Will add beta blocker today.  Patient has atypical chest pain that has been going on since she left the hospital after CABG.  She had a low risk myoview at [facility] in 2/11.   Problem # 3:  PREOPERATIVE EVALUATION Patient needs spinal surgery to be done.  Poor functional capacity.  She  had a low risk myoview with minimal ischemia.  She should start on a beta blocker pre-op given her known CAD (Toprol XL 50 mg daily).  As above, she does appear volume overloaded with diastolic CHF and would like to diurese her bit, started Lasix.  I will have her followup with Dr. Juanda Chance.  If her breathing appears more comfortable in followup, I suspect that she will be ready to go on to surgery without further cardiac testing.    Other Orders: EKG w/ Interpretation (93000) T-2 View CXR (71020TC) TLB-BNP (B-Natriuretic Peptide) (83880-BNPR) TLB-BMP (Basic Metabolic Panel-BMET) (80048-METABOL)  Patient Instructions: 1)  Your physician recommends that you have  lab work today--BMP/BNP 786.09 414.05 2)  Chest x-ray today 786.09 3)  Your physician has recommended you make the following change in your medication:  4)  Start Toprol XL 25mg  one tablet daily 5)  Dr Shirlee Latch wants to see lab results before clearing you for surgery.  6)  Your physician recommends that you schedule a follow-up appointment with Dr Juanda Chance in June 2011. Prescriptions: TOPROL XL 25 MG XR24H-TAB (METOPROLOL SUCCINATE) one tablet daily  #30 x 6   Entered by:   Katina Dung, RN, BSN   Authorized by:   Marca Ancona, MD   Signed by:   Katina Dung, RN, BSN on 02/22/2010   Method used:   Electronically to        [facility] (retail)       [address]       [place], [place]  [address]       Ph: [phone]       Fax: [phone]   RxID:   [id number]

## 2011-01-06 NOTE — Progress Notes (Signed)
Summary: paperwork- surgery is pending   Phone Note Call from Patient Call back at Home Phone [phone]   Caller: Patient Reason for Call: Talk to Nurse Summary of Call: per pt calling, to check on the status of paperwork that suppose to be sent to dr. Yetta Barre office .  [phone] . fax [phone]. sugery is still pending. pt wants dr.Catalia Massett to know she living in pain.  Initial call taken by: Lorne Skeens,  March 04, 2010 9:48 AM  Follow-up for Phone Call        Spoke with pt. regarding surgical clearance. I let patient know will fax Dr Yetta Barre  03/02/10 Dr. Juanda Chance office visit note. RN let pt. know MD's recommendations. Pt. Verbalized understanding. Follow-up by: Ollen Gross, RN, BSN,  March 04, 2010 10:17 AM

## 2011-01-06 NOTE — Assessment & Plan Note (Signed)
Summary: 3 MONTH ROV   Visit Type:  Follow-up Referring Provider:  Dr Juanetta Gosling  Primary Provider:  Kari Baars, MD  CC:  chest swollen, sob, edema, and chest pain.  History of Present Illness: The patient is 70 years old and return for management of her pacemaker and CAD. She has a Medtronic Kappa pacemaker and originally her pacemaker was implanted for AV block. She had her last generator change in 2003. In June of 2010 she had bypass surgery which was complicated by a sternal wound infection. She finally healed after much time.  She has been doing fairly well with her heart with no anginal chest pains and no shortness of breath or palpitations.  Her other medical problems include hypertension hyperlipidemia.  Her biggest problem right now is her back and she underwent surgery with screws and plates in her lumbar spine in May of this year.  Current Medications (verified): 1)  Patanol 0.1 % Soln (Olopatadine Hcl) .Marland Kitchen.. 1 Drop Rt Eye Each Night 2)  Neurontin 100 Mg Caps (Gabapentin) .Marland Kitchen.. 1 Tab Three Times A Day 3)  Dilantin 100 Mg Caps (Phenytoin Sodium Extended) .... Take 2 By Mouth Once Daily 4)  Protonix 40 Mg Solr (Pantoprazole Sodium) .Marland Kitchen.. 1 1tab Once Daily 5)  Metoclopramide Hcl 5 Mg Tabs (Metoclopramide Hcl) .... Take 2 By Mouth Two Times A Day 6)  Nexium 40 Mg Cpdr (Esomeprazole Magnesium) .... Take 1 By Mouth Once Daily 7)  Simvastatin 40 Mg Tabs (Simvastatin) .Marland Kitchen.. 1 Tab Once Daily 8)  Alprazolam 2 Mg Tabs (Alprazolam) .... Take 1 By Mouth Three Times A Day 9)  Lotrel 10-20 Mg Caps (Amlodipine Besy-Benazepril Hcl) .... One Tab By Mouth Once Daily 10)  Estropipate0.625 .Marland KitchenMarland Kitchen. 1 Daily 11)  Aspirin Ec 325 Mg Tbec (Aspirin) .... Take One Tablet By Mouth Daily 12)  Vicodin 5-500 Mg Tabs (Hydrocodone-Acetaminophen) .... As Needed For Pain 13)  Nabumetone 500 Mg Tabs (Nabumetone) .... One By Mouth Once A Day 14)  Toprol Xl 25 Mg Xr24h-Tab (Metoprolol Succinate) .... One Tablet  Daily 15)  Furosemide 20 Mg Tabs (Furosemide) .... One Tablet Daily 16)  Potassium Chloride Cr 10 Meq Cr-Caps (Potassium Chloride) .... Take One Tablet By Mouth Daily  Allergies (verified): 1)  ! * Codiene 2)  ! * Oxycontin 3)  ! * Oxycodone  Past History:  Past Medical History: Reviewed history from 02/22/2010 and no changes required. 1. 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 Hypertension 3. Seizure disorder 4. 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  Myoview (2/11) showed EF 62%, fixed septal defect c/w breast attenuation, minimal inferolateral reversible defect (attenuation versus mild ischemia).  Overall low risk study.  5. Sternal wound Infection Rx flap 07/2009 6. Gastroesophageal reflux disease 7. G E R D 8. Hyperlipidemia 9. Remote right arm DVT 10. Diastolic CHF: echo (2/11) mild to moderate LVH with EF 65-70%, normal wall motion, RV normal.  11. Obesity 12. Degenerative disc disease.   Review of Systems       ROS is negative except as outlined in HPI.   Vital Signs:  Patient profile:   70 year old female Height:      62 inches Weight:      224 pounds BMI:     41.12 Pulse rate:   66 / minute BP sitting:   138 / 76  (left arm)  Vitals Entered By: Burnett Kanaris, CNA (July 22, 2010 9:00 AM)  Physical Exam  Additional Exam:  Gen. Well-nourished, in no distress   Neck: No JVD, thyroid not enlarged, no carotid bruits Lungs: No tachypnea, clear without rales, rhonchi or wheezes Cardiovascular: Rhythm regular, PMI not displaced,  heart sounds  normal, no murmurs or gallops, no peripheral edema, pulses normal in all 4 extremities. Abdomen: BS normal, abdomen soft and non-tender without masses or organomegaly, no hepatosplenomegaly. MS: No deformities, no cyanosis or clubbing   Neuro:  No focal sns   Skin:  no lesions    PPM Specifications Following MD:  Everardo Beals. Juanda Chance,  MD     PPM Vendor:  Medtronic     PPM Model Number:  [id number]     PPM Serial Number:  [id number] PPM DOI:  11/01/2002     PPM Implanting MD:  Everardo Beals. Juanda Chance, MD  Lead 1    Location: RA     DOI: 07/16/1992     Model #: [id number]     Serial #: [id number]     Status: active Lead 2    Location: RV     DOI: 07/16/1992     Model #: [id number]     Serial #: [id number]     Status: active  Magnet Response Rate:  BOL 85 ERI  65  Indications:  Syncope   PPM Follow Up Remote Check?  No Battery Voltage:  2.67 V     Battery Est. Longevity:  9 months     Pacer Dependent:  No       PPM Device Measurements Atrium  Amplitude: 5.6 mV, Impedance: 701 ohms, Threshold: 1.0 V at 0.4 msec Right Ventricle  Amplitude: 15.68 mV, Impedance: 659 ohms, Threshold: 1.25 V at 0.4 msec  Episodes MS Episodes:  2     Percent Mode Switch:  0.1%     Coumadin:  No Ventricular High Rate:  2     Atrial Pacing:  85.6%     Ventricular Pacing:  3.5%  Parameters Mode:  DDDR     Lower Rate Limit:  60     Upper Rate Limit:  110 Paced AV Delay:  230     Sensed AV Delay:  230 Next Cardiology Appt Due:  01/05/2011 Tech Comments:  No parameter changes.  Device function normal.  2 mode switch episodes the longest 8:23 hours, - coumadin.  2 VHR episodes lasting only 3 seconds.  ROV 6 months. Altha Harm, LPN  July 22, 2010 9:42 AM   Impression & Recommendations:  Problem # 1:  PACEMAKER (ICD-V45.Marland Kitchen01) She had a Medtronic pacemaker that according to my records was originally planned for AV block. We interrogated the pacemaker today and she is pacing the atrium most the time but is conducting on her own and is pacing the ventricle very little of the time. Her AV delay was set at 250. Her thresholds are good. Her estimated longevity is 9 months.  Problem # 2:  CAD, AUTOLOGOUS BYPASS GRAFT (ICD-414.02)  She had previous bypass surgery in June of 2010 which was comp care by sternal wound infection. She is now doing well has had no chest pain this  problem appears stable. Her updated medication list for this problem includes:    Lotrel 10-20 Mg Caps (Amlodipine besy-benazepril hcl) ..... One tab by mouth once daily    Aspirin Ec 325 Mg Tbec (Aspirin) .Marland Kitchen... Take one tablet by mouth daily    Toprol Xl 25 Mg Xr24h-tab (Metoprolol succinate) ..... One tablet daily  Orders: EKG w/ Interpretation (93000)  Problem # 3:  HYPERTENSION, BENIGN (ICD-401.1) This is well controlled on current medications. Her updated medication list for this problem includes:    Lotrel 10-20 Mg Caps (Amlodipine besy-benazepril hcl) ..... One tab by mouth once daily    Aspirin Ec 325 Mg Tbec (Aspirin) .Marland Kitchen... Take one tablet by mouth daily    Toprol Xl 25 Mg Xr24h-tab (Metoprolol succinate) ..... One tablet daily    Furosemide 20 Mg Tabs (Furosemide) ..... One tablet daily  Patient Instructions: 1)  Your physician recommends that you continue on your current medications as directed. Please refer to the Current Medication list given to you today. 2)  Your physician wants you to follow-up in: 6 months with Dr. Johney Frame. You will receive a reminder letter in the mail two months in advance. If you don't receive a letter, please call our office to schedule the follow-up appointment.

## 2011-01-06 NOTE — Miscellaneous (Signed)
Summary: dx code correction  Clinical Lists Changes  Problems: Changed problem from PACEMAKER (ICD-V45.Marland Kitchen01) to PACEMAKER, PERMANENT (ICD-V45.01) changed the incorrect dx code to correct dx code Genella Mech  October 01, 2010 11:18 AM

## 2011-01-06 NOTE — Progress Notes (Signed)
Summary: refill  Phone Note Refill Request Message from:  Patient on September 16, 2010 1:00 PM  Refills Requested: Medication #1:  NEURONTIN 100 MG CAPS 1 tab three times a day Send to [facility] [phone]  Initial call taken by: Judie Grieve,  September 16, 2010 1:01 PM  Follow-up for Phone Call        Will discuss with Herbert Seta on this Monday Follow-up by: Burnett Kanaris, CNA,  September 17, 2010 3:15 PM    Prescriptions: NEURONTIN 100 MG CAPS (GABAPENTIN) 1 tab three times a day  #0 x 0   Entered by:   Burnett Kanaris, CNA   Authorized by:   Lenoria Farrier, MD, [facility]   Signed by:   Burnett Kanaris, CNA on 09/20/2010   Method used:   Electronically to        [facility] [address] [phone] (retail)       [address] [place]  [address]       Ph: [phone]       Fax: [phone]   RxID:   [id number]

## 2011-01-06 NOTE — Letter (Signed)
Summary: Appointment - Missed  [facility] [place]  [address] [place] [address]   Phone: [phone]  Fax: [phone]     December 31, 2010 MRN: [id number]   [facility] [address] [place], [place]  [address]   Dear Ms. Farver,  Our records indicate you missed your appointment on    12/20/10 Joni Reining               It is very important that we reach you to reschedule this appointment. We look forward to participating in your health care needs. Please contact us at the number listed above at your earliest convenience to reschedule this appointment.     Sincerely,    Glass blower/designer

## 2011-01-06 NOTE — Letter (Signed)
Summary: [facility]  [facility]   Imported By: Roderic Ovens 01/22/2010 12:05:59  _____________________________________________________________________  External Attachment:    Type:   Image     Comment:   External Document

## 2011-01-06 NOTE — Assessment & Plan Note (Signed)
Summary: per Gunnar Fusi and Tammy for pain at device site/tg   Visit Type:  Follow-up Referring Provider:  Dr Juanetta Gosling  Primary Provider:  Kari Baars, MD  CC:  hurting in pacemaker site.  History of Present Illness: [name] is a 70 AAF with known history of hypertension, arthritis and is s/p dual chamber pacemaker placed by Dr. Juanda Chance in 11/01/2002.  She has been having right sided chest discomfort that she describes as sharp and achey under pacemaker site.  She has brought this to the attention of Dr. Ladona Ridgel on recent visit and was told the pain was not from pacemaker and that she probably slept on it wrong,.  She says that at times the pacemaker moves around in the pocket and bends upright.  It eventually falls flat again when she is up and moving.  She  states that the pain is ongoing and worse during the day when she moves around.  No associated shortness of breath, dizziness, or syncope. Today she came in as a walk-in to the pacemaker device clinic and I am seeing at the request of device clinic RN for ongoing complaints.  Current Medications (verified): 1)  Patanol 0.1 % Soln (Olopatadine Hcl) .Marland Kitchen.. 1 Drop Rt Eye Each Night 2)  Neurontin 100 Mg Caps (Gabapentin) .Marland Kitchen.. 1 Tab Three Times A Day 3)  Dilantin 100 Mg Caps (Phenytoin Sodium Extended) .... Take 3 By Mouth Once Daily 4)  Protonix 40 Mg Solr (Pantoprazole Sodium) .Marland Kitchen.. 1 1tab Once Daily 5)  Metoclopramide Hcl 5 Mg Tabs (Metoclopramide Hcl) .... Take 2 By Mouth Two Times A Day 6)  Nexium 40 Mg Cpdr (Esomeprazole Magnesium) .... Take 1 By Mouth Once Daily 7)  Simvastatin 40 Mg Tabs (Simvastatin) .Marland Kitchen.. 1 Tab Once Daily 8)  Alprazolam 2 Mg Tabs (Alprazolam) .... Take 1 By Mouth Three Times A Day 9)  Lotrel 10-20 Mg Caps (Amlodipine Besy-Benazepril Hcl) .... One Tab By Mouth Once Daily 10)  Estropipate0.625 .Marland KitchenMarland Kitchen. 1 Daily 11)  Aspirin Ec 325 Mg Tbec (Aspirin) .... Take One Tablet By Mouth Daily 12)  Vicodin 5-500 Mg Tabs  (Hydrocodone-Acetaminophen) .... As Needed For Pain 13)  Nabumetone 500 Mg Tabs (Nabumetone) .... One By Mouth Once A Day 14)  Toprol Xl 25 Mg Xr24h-Tab (Metoprolol Succinate) .... One Tablet Daily 15)  Furosemide 20 Mg Tabs (Furosemide) .... One Tablet Daily 16)  Potassium Chloride Cr 10 Meq Cr-Caps (Potassium Chloride) .... Take One Tablet By Mouth Daily  Allergies (verified): 1)  ! * Codiene 2)  ! * Oxycontin 3)  ! * Oxycodone  Past History:  Past medical, surgical, family and social histories (including risk factors) reviewed, and no changes noted (except as noted below).  Past Medical History: Reviewed history from 02/22/2010 and no changes required. 1. 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 Hypertension 3. Seizure disorder 4. 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  Myoview (2/11) showed EF 62%, fixed septal defect c/w breast attenuation, minimal inferolateral reversible defect (attenuation versus mild ischemia).  Overall low risk study.  5. Sternal wound Infection Rx flap 07/2009 6. Gastroesophageal reflux disease 7. G E R D 8. Hyperlipidemia 9. Remote right arm DVT 10. Diastolic CHF: echo (2/11) mild to moderate LVH with EF 65-70%, normal wall motion, RV normal.  11. Obesity 12. Degenerative disc disease.   Past Surgical History: Reviewed history from 01/26/2010 and no changes required. Revision of lumbar wound  Back surgery Hip replacement Hysterectomy Knee replacement - left Pacemaker insertion Thyroid surgery  Wrist surgery CABG 6/10 - LIMA to LAD, SVG to OM, SVG to PDA  Family History: Reviewed history from 01/26/2010 and no changes required. Family History of Diabetes Family History Coronary Heart Disease female < 67  Social History: Reviewed history from 01/26/2010 and no changes required. No caffeine Married  Tobacco Use - No Alcohol Use - no  Review of  Systems       R chest pain under pacemaker.  Vital Signs:  Patient profile:   70 year old female Weight:      225 pounds BMI:     41.30 Pulse rate:   83 / minute BP sitting:   151 / 80  (right arm)  Vitals Entered By: Dreama Saa, CNA (October 01, 2010 10:57 AM)  Physical Exam  General:  Well developed, well nourished, in no acute distress. Lungs:  Clear bilaterally to auscultation and percussion. Heart:  Non-displaced PMI, chest non-tender; regular rate and rhythm, S1, S2 without murmurs, rubs or gallops. Carotid upstroke normal, no bruit. Normal abdominal aortic size, no bruits. Femorals normal pulses, no bruits. Pedals normal pulses. No edema, no varicosities. Abdomen:  Obese, NT 2+ BS Msk:  R arm and shoulder are equisitely tender to range of motion. She is unable to lift R arm to shoulder level without pain. Pressing down on my hand to bear weight on right side elicits pain. Palpaation around pacemaker to right shouler and right chest in the pectoral area is very tender.  No redness, warmth or swelling in or around pacemaker site.decreased ROM.   Pulses:  pulses normal in all 4 extremities Extremities:  No clubbing or cyanosis.trace left pedal edema and trace right pedal edema.   Neurologic:  Alert and oriented x 3. Psych:  Normal affect.   PPM Specifications Following MD:  Everardo Beals. Juanda Chance, MD     PPM Vendor:  Medtronic     PPM Model Number:  [id number]     PPM Serial Number:  [id number] PPM DOI:  11/01/2002     PPM Implanting MD:  Everardo Beals. Juanda Chance, MD  Lead 1    Location: RA     DOI: 07/16/1992     Model #: [id number]     Serial #: [id number]     Status: active Lead 2    Location: RV     DOI: 07/16/1992     Model #: [id number]     Serial #: [id number]     Status: active  Magnet Response Rate:  BOL 85 ERI  65  Indications:  Syncope   PPM Follow Up Pacer Dependent:  No      Episodes Coumadin:  No  Parameters Mode:  DDDR     Lower Rate Limit:  60     Upper Rate Limit:  110 Paced AV  Delay:  230     Sensed AV Delay:  230  Impression & Recommendations:  Problem # 1:  MUSCULOSKELETAL PAIN (ICD-781.99) The pain around the right side of the chest and near pacemaker appears to me to be musculoskeltal in orgin. She has arthritis in her knees and upper extremities bilaterally. She uses the right arm to bear weight using a cane for ambulation.  ROM of the right shoulder is painful. Palpation around the pacemaker site elicits pain as well.    I have ordered a right shoulder film and have advised her to use a walker for ambulation instead of  a cane to distribute tne weight of dependency of her upper extremities to assist with weight bearing to walk.  She is to use heating pad for soreness and take her already existing dose of as needed vicodin every 6 hours for the next 2 days to alleviate pain.  She will follow-up with Dr. Juanetta Gosling for further evaluation and treatment. R shoulder films will be sent to him. Orders: T-Shoulder Left Min 2 Views (73030TC) T-Shoulder Right (73030TC)  Patient Instructions: 1)  Your physician recommends that you schedule a follow-up appointment in: as scheduled with Dr. Ladona Ridgel in 6 months 2)  apply heat to right shoulder  3)  please use vicodin every 6 hrs as needed pain 4)  use walker for ambulation 5)  right shoulder xray 6)  please follow up with Dr. Juanetta Gosling on Monday 10/361/2011  7)  walk in day for established pts

## 2011-01-06 NOTE — Letter (Signed)
Summary: Historic Medical Records Dr.George [facility] Medical Records Dr.George Aitken   Imported By: Cammie Sickle 04/27/2010 19:49:30  _____________________________________________________________________  External Attachment:    Type:   Image     Comment:   External Document

## 2011-01-06 NOTE — Progress Notes (Signed)
Summary: Referral to [facility]  Phone Note Outgoing Call   Call placed by: Waldon Reining,  February 02, 2010 12:04 PM Call placed to: Specialist Action Taken: Information Sent Summary of Call: I faxed a referral for this patient to [facility] for 2nd opinion for revision of left TKA.

## 2011-01-06 NOTE — Letter (Signed)
Summary: Appointment - Missed  [facility]  [address] [place] [address]   Phone: [phone]  Fax: [phone]     December 24, 2010 MRN: [id number]   [facility] [address] [place], [place]  [address]   Dear Ms. Cannella,  Our records indicate you missed your appointment on     12/24/10 Samara Deist lawrence NP       .                                    It is very important that we reach you to reschedule this appointment. We look forward to participating in your health care needs. Please contact us at the number listed above at your earliest convenience to reschedule this appointment.     Sincerely,    Glass blower/designer

## 2011-01-06 NOTE — Letter (Signed)
Summary: [facility] Office Note  [facility] Office Note   Imported By: Roderic Ovens 05/11/2010 12:14:03  _____________________________________________________________________  External Attachment:    Type:   Image     Comment:   External Document

## 2011-01-06 NOTE — Progress Notes (Signed)
  Phone Note Outgoing Call   Call placed by: Dennis Bast, RN, BSN,  September 29, 2010 10:55 AM Call placed to: Patient Summary of Call: rec'd call from Dr Christell Constant regarding pt's pacer site and having pain around it.  Will call pt and arrange for her to come in and have it looked at  Initial call taken by: Dennis Bast, RN, BSN,  September 29, 2010 10:56 AM  Follow-up for Phone Call        called pt and her voicemail will not hold any more messages, will try back later Dennis Bast, RN, BSN  September 29, 2010 10:57 AM will come in today at 4pm to see DrTaylor Dennis Bast, RN, BSN  September 29, 2010 12:17 PM

## 2011-01-06 NOTE — Assessment & Plan Note (Signed)
Summary: 4 WK RE--CK/RESP TO MED,ELBOW PAIN/SEC HORIZ EVERCARE/CAF   Visit Type:  Follow-up Referring Provider:  Dr Juanetta Gosling  Primary Provider:  Kari Baars, MD  CC:  recheck elbow.  History of Present Illness: [name] had acute exacerbation of bilateral elbow arthritis which has been relieved with prednisone Dosepak.  She is now returning for followup visit complains of no elbow pain but continues to complain of anterior knee pain and inability to weight-bear.  Our previous records indicate that she did have a LEFT total knee replacement did well for several years went to cardiac rehabilitation and started having anterior knee pain radiographs show no complicating features with the replacement.  Physical exam shows tenderness over the patellar tendon the knee appears to be stable.  There is no sign of infection or joint effusion.  The surgery was done in [facility] over 20 years ago  New Meds: Norco 5 and Neurontin 100mg  three times a day, helps for back.     Current Medications (verified): 1)  Patanol 0.1 % Soln (Olopatadine Hcl) .Marland Kitchen.. 1 Drop Rt Eye Each Night 2)  Neurontin 100 Mg Caps (Gabapentin) .Marland Kitchen.. 1 Tab Three Times A Day 3)  Dilantin 100 Mg Caps (Phenytoin Sodium Extended) .... Take 2 By Mouth Once Daily 4)  Protonix 40 Mg Solr (Pantoprazole Sodium) .Marland Kitchen.. 1 1tab Once Daily 5)  Metoclopramide Hcl 5 Mg Tabs (Metoclopramide Hcl) .... Take 2 By Mouth Two Times A Day 6)  Nexium 40 Mg Cpdr (Esomeprazole Magnesium) .... Take 1 By Mouth Once Daily 7)  Simvastatin 40 Mg Tabs (Simvastatin) .Marland Kitchen.. 1 Tab Once Daily 8)  Alprazolam 2 Mg Tabs (Alprazolam) .... Take 1 By Mouth Three Times A Day 9)  Lotrel 10-20 Mg Caps (Amlodipine Besy-Benazepril Hcl) .... One Tab By Mouth Once Daily 10)  Estropipate0.625 .Marland KitchenMarland Kitchen. 1 Daily 11)  Aspirin Ec 325 Mg Tbec (Aspirin) .... Take One Tablet By Mouth Daily 12)  Vicodin 5-500 Mg Tabs (Hydrocodone-Acetaminophen) 13)  Nabumetone 500 Mg  Tabs (Nabumetone) .... One By Mouth Once A Day 14)  Prednisone (Pak) 10 Mg Tabs (Prednisone) .... As Directed  Allergies (verified): 1)  ! * Codiene 2)  ! * Oxycontin 3)  ! 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 Hypertension Seizure disorder CAD - multivessel, LVEF 65-70% Sternal wound Infection Rx flap 07/2009 Gastroesophageal reflux disease G E R D Myocardial Infarction - inferior, 6/09 Hyperlipidemia Remote right arm DVT  Past Surgical History: Last updated: 01/26/2010 Revision of lumbar wound Back surgery Hip replacement Hysterectomy Knee replacement - left Pacemaker insertion Thyroid surgery  Wrist surgery CABG 6/10 - LIMA to LAD, SVG to OM, SVG to PDA  Family History: Last updated: 01/26/2010 Family History of Diabetes Family History Coronary Heart Disease female < 107  Social History: Last updated: 01/26/2010 No caffeine Married  Tobacco Use - No Alcohol Use - no  Risk Factors: Smoking Status: never (01/26/2010)  Physical Exam  Additional Exam:  the patient walks with a supported gait.  She waddles and appears to limp.  Her knee is completely stable she has good extension power it with some pain with resisted extension.  Collateral ligaments are also stable.  Range of motion is good at 110.  She comes to full extension.  Incision is nontender.  She is tender over the patellar tendon.   Impression & Recommendations:  Problem # 1:  KNEE PAIN (ICD-719.46) Assessment Deteriorated  I think she has tendinitis brought on by cardiac rehabilitation.  Her period of rest and anti-inflammatories have helped and she was on steroids which made no difference.  Because this was such a complicated procedure initially as there was a stemmed component and it is a revision like implant I like to get this  evaluated for second opinion just to make sure area  Her updated medication list for this problem includes:    Aspirin Ec 325 Mg Tbec (Aspirin) .Marland Kitchen... Take one tablet by mouth daily    Vicodin 5-500 Mg Tabs (Hydrocodone-acetaminophen)    Nabumetone 500 Mg Tabs (Nabumetone) ..... One by mouth once a day  Orders: Est. Patient Level III ([address])  Problem # 2:  ARTHRITIS, UPPER ARM (ICD-716.92) Assessment: Improved  Orders: Est. Patient Level III ([address])  Other Orders: Orthopedic Surgeon Referral (Ortho Surgeon)  Patient Instructions: 1)  Consult Joint Replacement specialist: [facility] ( or [facility])  2)  possible revision of left TKA

## 2011-01-10 NOTE — Consult Note (Addendum)
NAMEYANILEN, ADAMIK                ACCOUNT NO.:  [id number]  MEDICAL RECORD NO.:  [id number]          PATIENT TYPE:  INP  LOCATION:  A331                          FACILITY:  [facility]  PHYSICIAN:  Gerrit Friends. Dietrich Pates, MD, FACCDATE OF BIRTH:  01/27/1941  DATE OF CONSULTATION:  12/03/2010 DATE OF DISCHARGE:                                CONSULTATION   REASON FOR CONSULTATION:  Chest pain.  PRIMARY CARDIOLOGIST:  Everardo Beals. Juanda Chance, MD, [facility] and now will be Gerrit Friends. Dietrich Pates, MD, [facility]  PRIMARY CARE PHYSICIAN:  Oneal Deputy. Juanetta Gosling, MD  HISTORY OF PRESENT ILLNESS:  A 70 year old obese African American female with known history of coronary artery disease, coronary artery bypass grafting, hypertension, GERD with complaints of chest pain on and off for 2 days which she describes as squeezing lasting seconds while taking husband to his doctor.  The pain returned in more intense the following afternoon and day lasting approximately 15 minutes each time for 1-1/2 hours, did not take any nitroglycerin.  She went to sleep awoke and had severe pain which started in her right jaw radiating to her chest with left shoulder discomfort and severe pressure.  She became diaphoretic and mild shortness of breath.  She took nitroglycerin x2 5 minutes apart and had some minimal relief.  She called EMS and was transferred to [facility].  The pain is similar to the pain prior to her coronary artery bypass grafting which she describes as some pressure, but is more intense at this time.  She had pain up until 10:30 a.m. today and has had relief of this at this time.  REVIEW OF SYSTEMS:  Positive for sweats, chest pain, shortness of breath and pressure with radiation from her right jaw through her chest to the left shoulder.  All other systems were reviewed and are found to be negative.  CODE STATUS:  Full.  PAST MEDICAL HISTORY: 1. Coronary artery disease.     a.     Status post cardiac  catheterization in June 2010, revealing      LAD 80-90% mid and first diagonal with a small vessel of 90% and      second diagonal 80% completely occluded circumflex with      collaterals, right coronary artery moderate size with 99% stenosis      and 90% stenosis proximal to that with collaterals.     b.     Status post coronary artery bypass grafting with LIMA to      LAD, SVG to circumflex and SVG to PDA in June 2010. 2. Stress test in February 2011, minimal persistent posterolateral     defect with reversibility could reflect variable soft tissue     attenuation. 3. Status post pacemaker.  New generator implantation secondary to end-     of-life in 2003, Medtronic secondary to second-degree AV block. 4. Hypertension. 5. Seizure disorder. 6. Sternal wound infection. 7. GERD. 8. Hyperlipidemia. 9. Obesity. 10.Chronic back pain with spinal degenerative disk disease.  PAST SURGICAL HISTORY:  Pacemaker implantation in 2003, lumbar disk repair, hip replacement, hysterectomy and left knee replacement.  SOCIAL HISTORY:  She lives in [place] with her husband.  She is married.  She does not smoke, drink or use drugs.  FAMILY HISTORY:  CAD with her brother with MI x2.  Her father deceased with cancer.  CURRENT MEDICATIONS: 1. Metoclopramide 10 mg q.i.d. 2. Metformin 500 mg daily. 3. Estradiol 5 mg daily. 4. Lasix 20 mg daily. 5. Gabapentin 300 mg t.i.d. 6. Potassium 10 mEq daily. 7. Vimovo 375/20 daily. 8. Metoprolol 25 mg daily. 9. Nexium 40 mg daily. 10.Amlodipine and benazepril 10/20 daily. 11.Pravastatin 40 mg daily.  ALLERGIES:  To CODEINE, OXYCONTIN and OXYCODONE.  CURRENT LABORATORY DATA:  Sodium 139, potassium 3.6, chloride 103, CO2 26, BUN 24, creatinine 0.96, glucose 110, hemoglobin 10.8, hematocrit 32.1, white blood cells 10.4, platelets 160, BNP 46.8, troponin 0.02. EKG revealing normal sinus rhythm with a rate of 62 beats per minute with T-wave flattening of  the septal and laterally.  No change from February 2011.  Chest x-ray no pleural effusions dated December 02, 2010.  PHYSICAL EXAMINATION:  VITAL SIGNS:  Blood pressure 145/79, pulse 69, respirations 16, temperature 98.3, O2 sats 97% on room air, weight 105.6 kg.  GENERAL:  She is awake, alert and oriented.  No acute distress. HEENT:  Head is normocephalic and atraumatic.  Eyes, PERRLA. NECK:  Supple without JVD or carotid bruits. CARDIOVASCULAR:  Distant heart sounds.  Regular rhythm and rate without murmurs, rubs or gallops.  Pulses are 2+ and equal without bruits. LUNGS:  Clear to auscultation without wheezes, rales or rhonchi.  Chest wall well-healed sternotomy scar is noted without evidence of infection. ABDOMEN:  Soft, nontender with 2+ bowel sounds. EXTREMITIES:  Without clubbing, cyanosis or edema. MUSCULOSKELETAL:  There is pain with palpation of the thoracic spine and into the neck and some pain with movement upper torso. NEURO:  Cranial nerves II through XII are grossly intact.  IMPRESSION: 1. Chest pain with right jaw radiating to the left shoulder across the     chest with associated diaphoresis worsening in intensity over the     last 3 days.  Initial cardiac enzymes are negative.  EKG is     unchanged.  Last nuclear study in February 2011, was negative for     ischemia.  She was found to be hypokalemic with potassium of 3.6     which was repleted. 2. Coronary artery disease, status post coronary artery bypass graft     in June 2010, with three-vessel disease, severe per catheterization     prior to this. 3. Chronic spinal degenerative disease with back surgeries. 4. Hypertension, mildly elevated on admission.  PLAN:  The patient was seen and examined by myself and Dr. [place] Bing.  Our plan is to proceed with cardiac catheterization. However, as this is a holiday weekend, we will not be able to transfer her until December 06, 2010, unless this is an emergent  procedure. Previous stress test was equivocal,  her grafts are approximately 72 years old.  These symptoms are worrisome for restenosis  or other progressive coronary artery disease.  Therefore; catheterization is recommended.  We will plan transfer and make further recommendations throughout hospital course depending upon the patient's response.     Bettey Mare. Lyman Bishop, NP   ______________________________ Gerrit Friends. Dietrich Pates, MD, [facility]    KML/MEDQ  D:  12/03/2010  T:  12/04/2010  Job:  [id number]  cc:   Ramon Dredge L. Juanetta Gosling, M.D. Fax: [phone]  Electronically Signed by Joni Reining NP on 12/07/2010 04:23:50 PM Electronically Signed  by [place] Bing MD [facility] on 01/10/2011 08:14:04 AM

## 2011-01-12 ENCOUNTER — Ambulatory Visit: Payer: PRIVATE HEALTH INSURANCE | Admitting: Internal Medicine

## 2011-01-16 IMAGING — CR DG CHEST 2V
2 series · 2 of 2 positions shown · non-contrast
Comparison: 06/30/2009 and CT chest 06/08/2009

CLINICAL DATA: Chest pain, cough, fever.  Postoperative wound
infection.

CHEST - 2 VIEW

[w chest ap *]
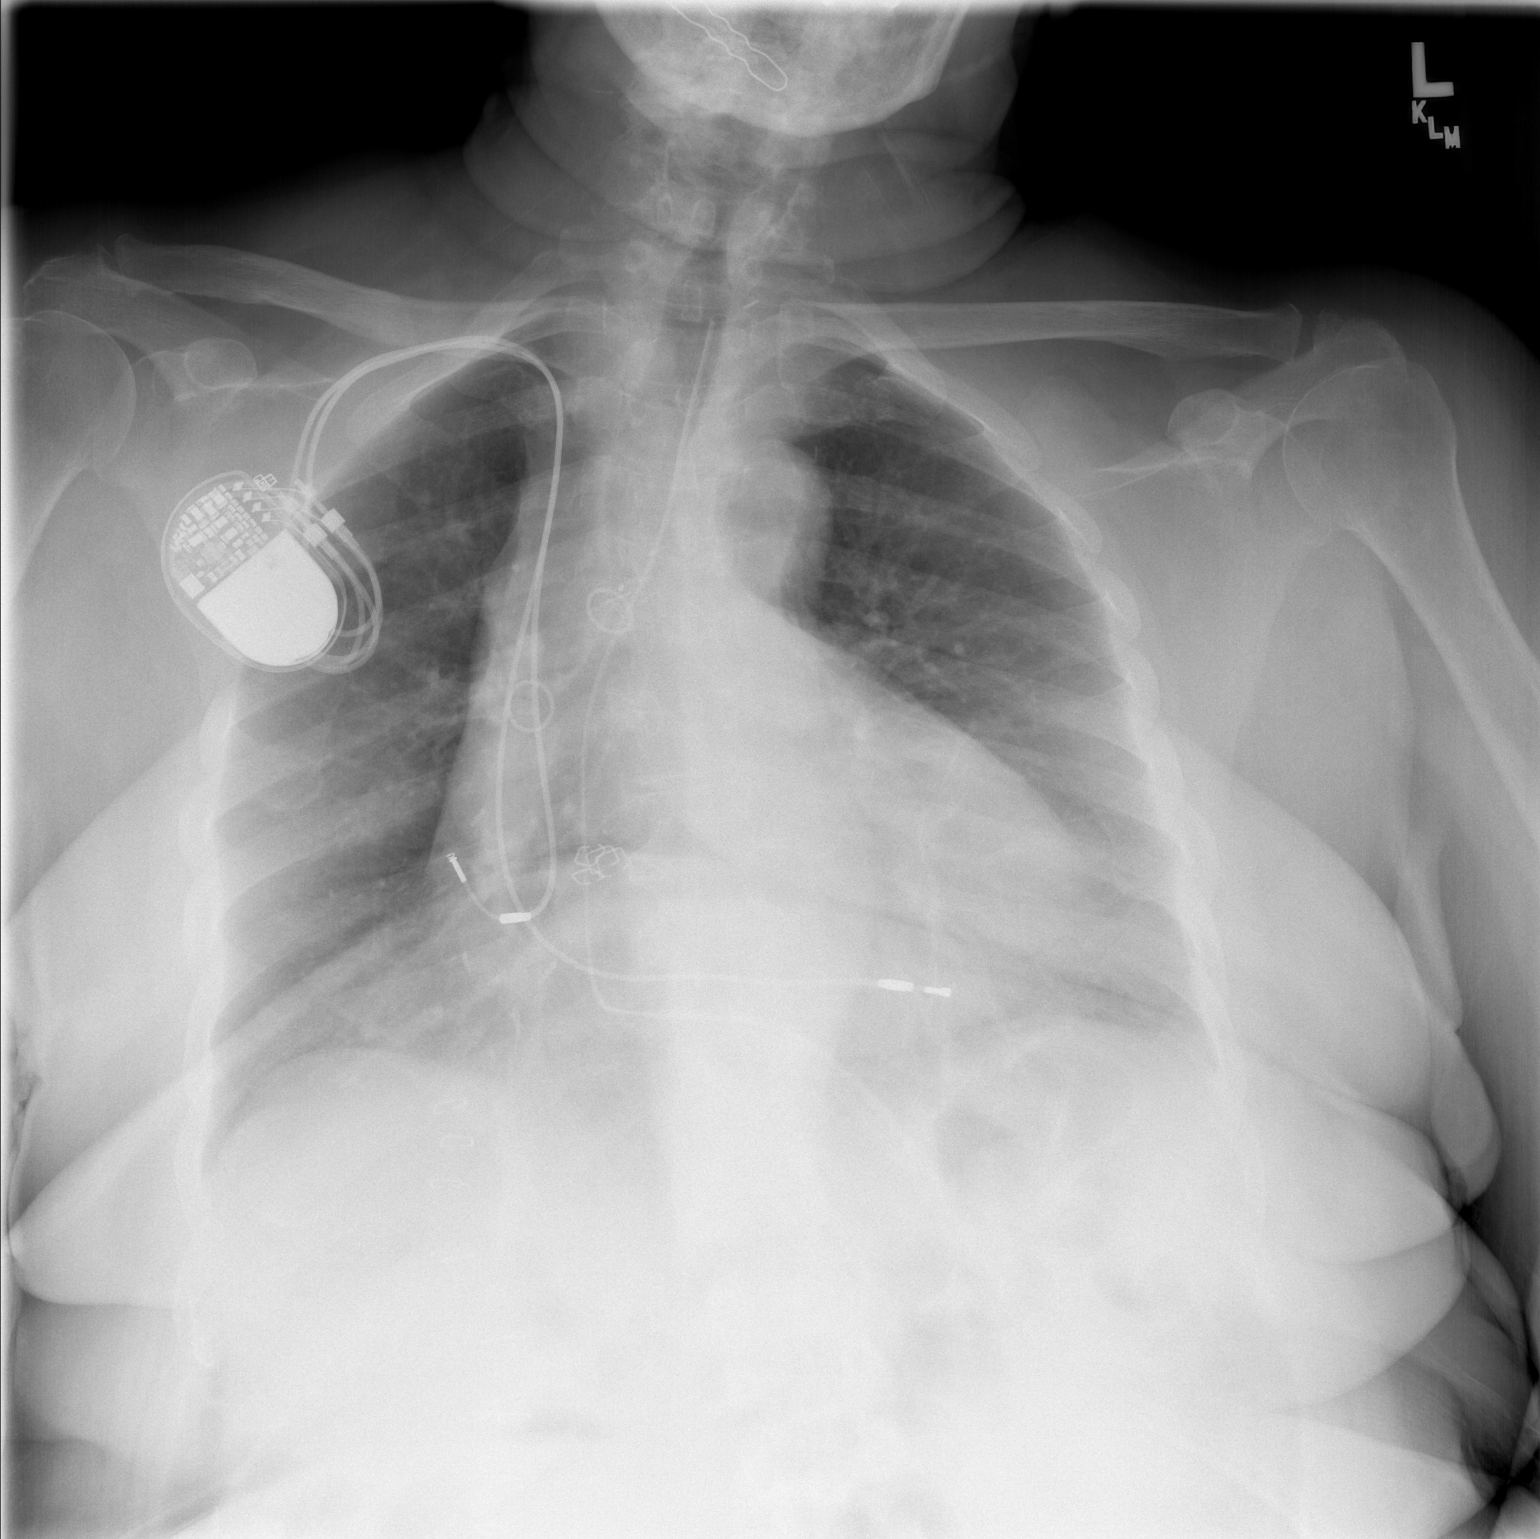

[w chest lat *]
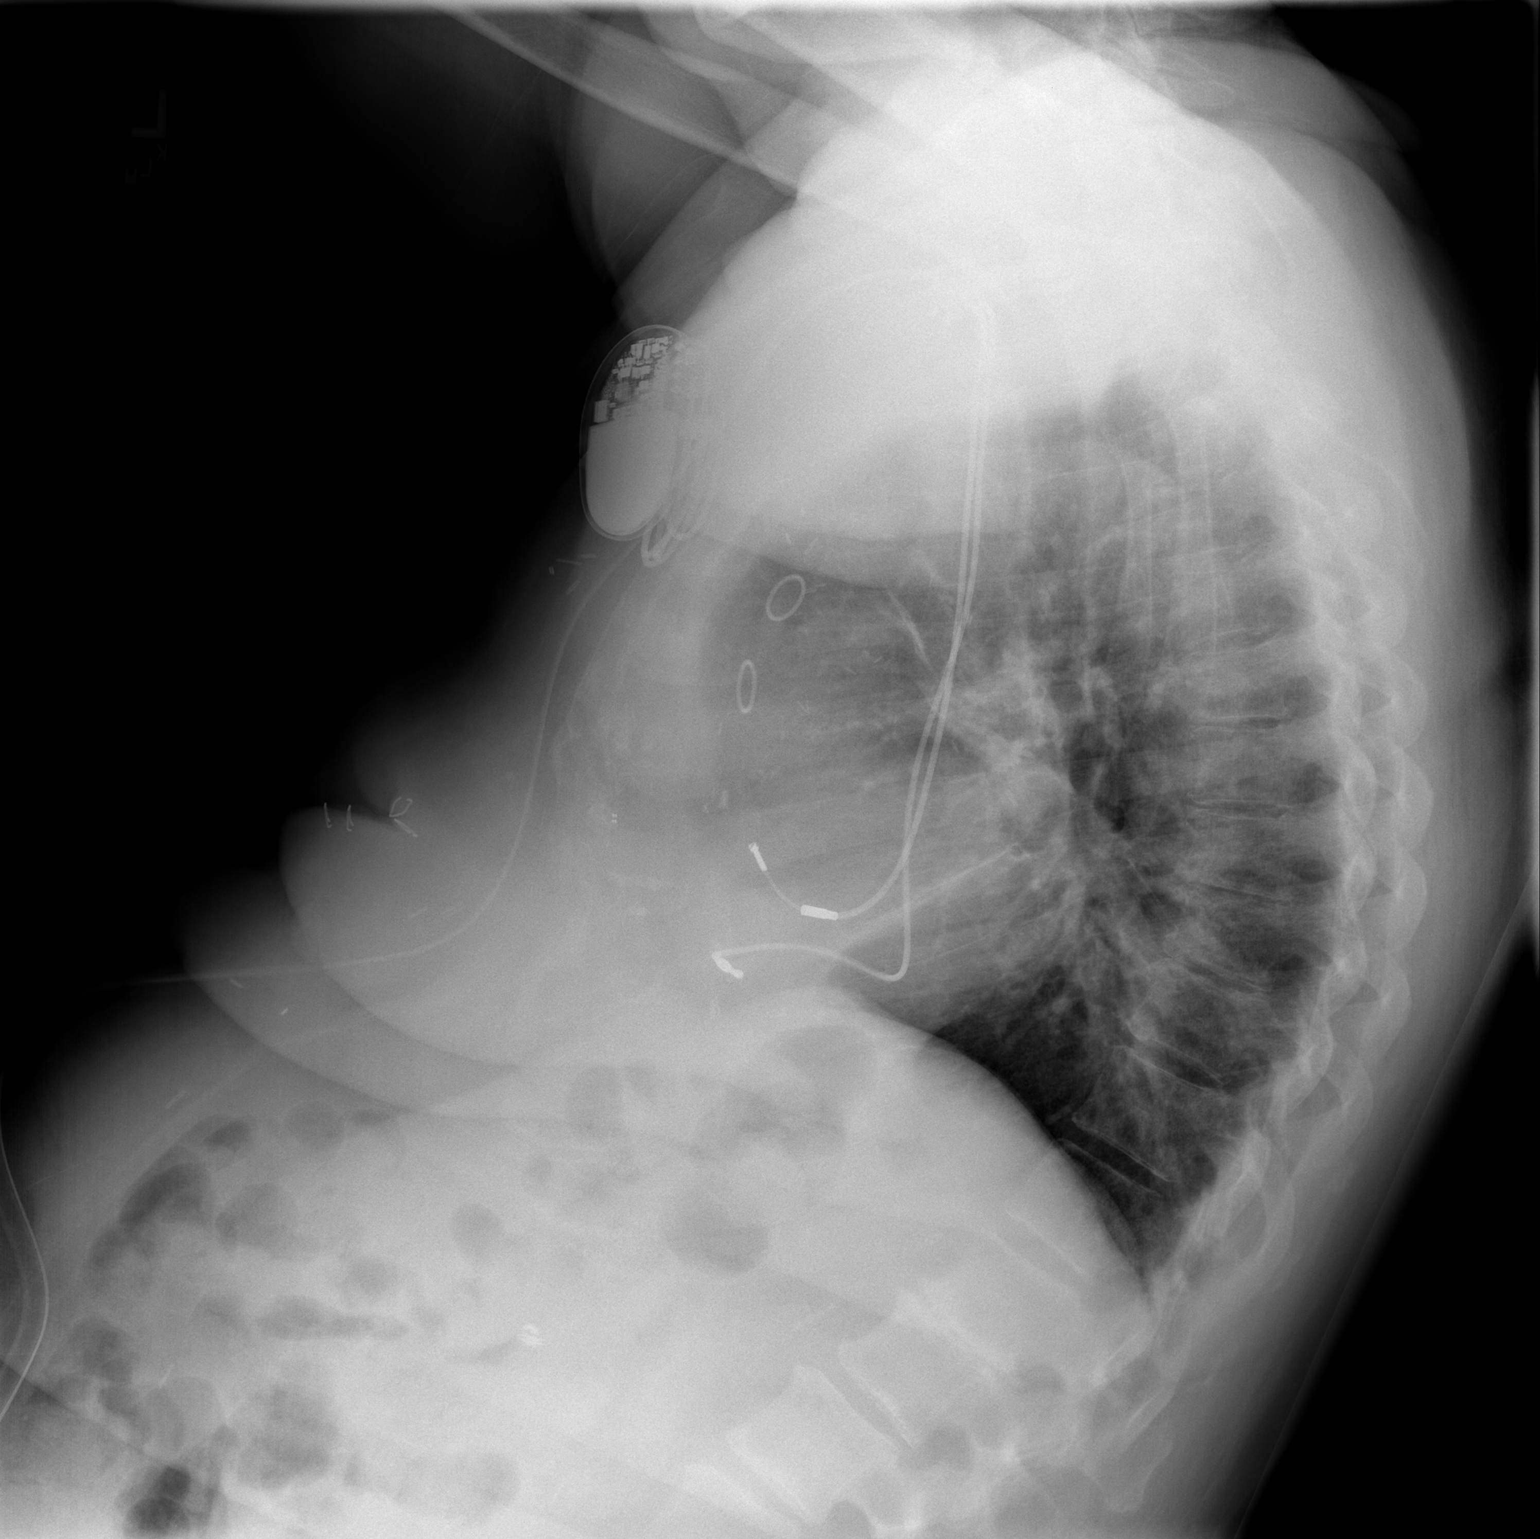

[2 of 2 positions shown; findings below may reference images not displayed]

FINDINGS: Trachea is midline.  Heart size stable.  Mediastinal
drain remains in place.  Right subclavian pacemaker lead tips
project over the right atrium and right ventricle.  There may be
minimal bibasilar atelectasis.  Lungs are otherwise clear.  No
pleural fluid.  Scoliosis.
IMPRESSION: Minimal bibasilar atelectasis.

## 2011-01-18 ENCOUNTER — Ambulatory Visit (HOSPITAL_COMMUNITY)
Admission: RE | Admit: 2011-01-18 | Discharge: 2011-01-18 | Disposition: A | Discharge status: Home / Self Care | Payer: PRIVATE HEALTH INSURANCE | Source: Ambulatory Visit | Attending: Pulmonary Disease | Admitting: Pulmonary Disease

## 2011-01-18 ENCOUNTER — Other Ambulatory Visit (HOSPITAL_COMMUNITY): Payer: Self-pay | Admitting: Pulmonary Disease

## 2011-01-18 ENCOUNTER — Encounter: Payer: Self-pay | Admitting: Adult Health

## 2011-01-18 ENCOUNTER — Ambulatory Visit (INDEPENDENT_AMBULATORY_CARE_PROVIDER_SITE_OTHER): Payer: PRIVATE HEALTH INSURANCE | Admitting: Adult Health

## 2011-01-18 DIAGNOSIS — Z950 Presence of cardiac pacemaker: Secondary | ICD-10-CM | POA: Insufficient documentation

## 2011-01-18 DIAGNOSIS — Z951 Presence of aortocoronary bypass graft: Secondary | ICD-10-CM | POA: Insufficient documentation

## 2011-01-18 DIAGNOSIS — R0602 Shortness of breath: Secondary | ICD-10-CM | POA: Insufficient documentation

## 2011-01-18 DIAGNOSIS — R29818 Other symptoms and signs involving the nervous system: Secondary | ICD-10-CM

## 2011-01-18 DIAGNOSIS — I2510 Atherosclerotic heart disease of native coronary artery without angina pectoris: Secondary | ICD-10-CM

## 2011-01-18 DIAGNOSIS — I10 Essential (primary) hypertension: Secondary | ICD-10-CM

## 2011-01-20 NOTE — Cardiovascular Report (Signed)
Summary: [facility] Cardiac Cath   [facility] Cardiac Cath   Imported By: Earl Many 01/06/2011 18:33:47  _____________________________________________________________________  External Attachment:    Type:   Image     Comment:   External Document

## 2011-01-26 NOTE — Assessment & Plan Note (Signed)
Summary: eph / [place] / per pt call / tg   Visit Type:  follow-up per Gunnar Fusi panin at device Referring Provider:  Dr Juanetta Gosling  Primary Provider:  Kari Baars, MD   History of Present Illness: [name] is a 70 y/o morbidly obese AAF we are following with known history of CAD, with CABG.  She was recently admitted to [facility] in January of 2012 and subsequently transferred to [facility] hospital for cardiac catherization in the setting of recurrent chest pain.  She was found to have severe native vessel disease but widely patent 3/3 grafts. She comes today for follow up with consistant complaints.  She is having DOE, pain with movement and deep breaths, she is tired all the time and is anxious about her health isses.  She states that the pain in her chest and breathing status is wearing her out. She is to see Dr. Juanetta Gosling today as well.  As far as she remembers, she has not had a sleep study here in [place] but did have on [facility] in the past and she states she was told she had sleep apnea then, but no CPAP was ordered.  Current Medications (verified): 1)  Patanol 0.1 % Soln (Olopatadine Hcl) .Marland Kitchen.. 1 Drop Rt Eye Each Night 2)  Neurontin 100 Mg Caps (Gabapentin) .Marland Kitchen.. 1 Tab Three Times A Day 3)  Dilantin 100 Mg Caps (Phenytoin Sodium Extended) .... Take 3 By Mouth Once Daily 4)  Protonix 40 Mg Solr (Pantoprazole Sodium) .Marland Kitchen.. 1 1tab Once Daily 5)  Metoclopramide Hcl 5 Mg Tabs (Metoclopramide Hcl) .... Take 2 By Mouth Two Times A Day 6)  Nexium 40 Mg Cpdr (Esomeprazole Magnesium) .... Take 1 By Mouth Once Daily 7)  Simvastatin 40 Mg Tabs (Simvastatin) .Marland Kitchen.. 1 Tab Once Daily 8)  Alprazolam 2 Mg Tabs (Alprazolam) .... Take 1 By Mouth Three Times A Day 9)  Lotrel 10-20 Mg Caps (Amlodipine Besy-Benazepril Hcl) .... One Tab By Mouth Once Daily 10)  Estropipate0.625 .Marland KitchenMarland Kitchen. 1 Daily 11)  Aspirin Ec 325 Mg Tbec (Aspirin) .... Take One Tablet By Mouth Daily 12)  Vicodin 5-500 Mg Tabs (Hydrocodone-Acetaminophen) .... As  Needed For Pain 13)  Nabumetone 500 Mg Tabs (Nabumetone) .... One By Mouth Once A Day 14)  Toprol Xl 25 Mg Xr24h-Tab (Metoprolol Succinate) .... One Tablet Daily 15)  Furosemide 20 Mg Tabs (Furosemide) .... One Tablet Daily 16)  Potassium Chloride Cr 10 Meq Cr-Caps (Potassium Chloride) .... Take One Tablet By Mouth Daily  Allergies (verified): 1)  ! * Codiene 2)  ! * Oxycontin 3)  ! * Oxycodone  Past History:  Past medical, surgical, family and social histories (including risk factors) reviewed, and no changes noted (except as noted below).  Past Medical History: Reviewed history from 02/22/2010 and no changes required. 1. 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 Hypertension 3. Seizure disorder 4. 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  Myoview (2/11) showed EF 62%, fixed septal defect c/w breast attenuation, minimal inferolateral reversible defect (attenuation versus mild ischemia).  Overall low risk study.  5. Sternal wound Infection Rx flap 07/2009 6. Gastroesophageal reflux disease 7. G E R D 8. Hyperlipidemia 9. Remote right arm DVT 10. Diastolic CHF: echo (2/11) mild to moderate LVH with EF 65-70%, normal wall motion, RV normal.  11. Obesity 12. Degenerative disc disease.   Past Surgical History: Reviewed history from 01/26/2010 and no changes required.  Revision of lumbar wound Back surgery Hip replacement Hysterectomy Knee replacement - left Pacemaker insertion Thyroid surgery  Wrist surgery CABG 6/10 - LIMA to LAD, SVG to OM, SVG to PDA  Family History: Reviewed history from 01/26/2010 and no changes required. Family History of Diabetes Family History Coronary Heart Disease female < 51  Social History: Reviewed history from 01/26/2010 and no changes required. No caffeine Married  Tobacco Use - No Alcohol Use - no  Review of Systems       DOE, chronic pain,  fatigue, multiple somatic complaints.  All other systems have been reviewed and are negative unless stated above.   Vital Signs:  Patient profile:   70 year old female Height:      62 inches Weight:      236 pounds O2 Sat:      96 % on Room air Pulse rate:   78 / minute BP sitting:   133 / 68  (right arm)  Vitals Entered By: Teressa Lower RN (January 18, 2011 11:27 AM)  O2 Flow:  Room air 68  Physical Exam  General:  normal appearance.   Lungs:  Clear bilaterally to auscultation and percussion. Heart:  Non-displaced PMI, chest non-tender; regular rate and rhythm, S1, S2 without murmurs, rubs or gallops. Carotid upstroke normal, no bruit. Normal abdominal aortic size, no bruits. Femorals normal pulses, no bruits. Pedals normal pulses. No edema, no varicosities. Abdomen:  Obese with tenderness in the RUQ. She says that her gallbladder has been removed. Msk:  Difficulty walking with stiffness and use of cane to ambulate.  She is sore all over. Pulses:  pulses normal in all 4 extremities Extremities:  trace left pedal edema and trace right pedal edema.   Neurologic:  Alert and oriented x 3. Psych:  depressed affect.     PPM Specifications Following MD:  Everardo Beals. Juanda Chance, MD     PPM Vendor:  Medtronic     PPM Model Number:  [id number]     PPM Serial Number:  [id number] PPM DOI:  11/01/2002     PPM Implanting MD:  Everardo Beals. Juanda Chance, MD  Lead 1    Location: RA     DOI: 07/16/1992     Model #: [id number]     Serial #: [id number]     Status: active Lead 2    Location: RV     DOI: 07/16/1992     Model #: [id number]     Serial #: [id number]     Status: active  Magnet Response Rate:  BOL 85 ERI  65  Indications:  Syncope   PPM Follow Up Pacer Dependent:  No      Episodes Coumadin:  No  Parameters Mode:  DDDR     Lower Rate Limit:  60     Upper Rate Limit:  110 Paced AV Delay:  230     Sensed AV Delay:  230  Impression & Recommendations:  Problem # 1:  CAD, AUTOLOGOUS BYPASS GRAFT (ICD-414.02) She  is stable from CV standpoint.  Catheterization is reassuring. This has been discussed with her.  No changes on her medications. Her updated medication list for this problem includes:    Lotrel 10-20 Mg Caps (Amlodipine besy-benazepril hcl) ..... One tab by mouth once daily    Aspirin Ec 325 Mg Tbec (Aspirin) .Marland Kitchen... Take one tablet by mouth daily    Toprol Xl 25 Mg Xr24h-tab (Metoprolol succinate) ..... One tablet daily  Problem # 2:  MUSCULOSKELETAL PAIN (  ICD-781.99) This appears to be a chronic complaint of hers.  Pain medications are on board but do not appear to be of help.  Will refer her to PMD for futher asessment and questionable need to have her seen by pain management group.  Problem # 3:  SHORTNESS OF BREATH (ICD-786.05) She states that she is always short of breath and it worsens when she walks.  As far as I can see, she has nto had a sleep study here in [place] or PFTS.  This will be addressed by Dr. Juanetta Gosling on his visit today and will defer to his expertise in ordering further lung evaluations,.  This may be also related to morbid obesity and deconditioning as well.  Wt lose is recommended. Her updated medication list for this problem includes:    Lotrel 10-20 Mg Caps (Amlodipine besy-benazepril hcl) ..... One tab by mouth once daily    Aspirin Ec 325 Mg Tbec (Aspirin) .Marland Kitchen... Take one tablet by mouth daily    Toprol Xl 25 Mg Xr24h-tab (Metoprolol succinate) ..... One tablet daily    Furosemide 20 Mg Tabs (Furosemide) ..... One tablet daily  Patient Instructions: 1)  Your physician recommends that you schedule a follow-up appointment in: 6 months with cardiology and soon with Dr. Juanetta Gosling for sleep study and breathing tests 2)  Your physician recommends that you continue on your current medications as directed. Please refer to the Current Medication list given to you today.

## 2011-01-27 ENCOUNTER — Ambulatory Visit (HOSPITAL_COMMUNITY)
Admission: RE | Admit: 2011-01-27 | Discharge: 2011-01-27 | Disposition: A | Discharge status: Home / Self Care | Payer: PRIVATE HEALTH INSURANCE | Source: Ambulatory Visit | Attending: Pulmonary Disease | Admitting: Pulmonary Disease

## 2011-01-27 DIAGNOSIS — R0602 Shortness of breath: Secondary | ICD-10-CM | POA: Insufficient documentation

## 2011-01-28 ENCOUNTER — Encounter: Payer: Self-pay | Admitting: Cardiology

## 2011-02-01 NOTE — Letter (Signed)
Summary: MEDICAID,FORM  MEDICAID,FORM   Imported By: Faythe Ghee 01/28/2011 14:36:37  _____________________________________________________________________  External Attachment:    Type:   Image     Comment:   External Document

## 2011-02-07 ENCOUNTER — Encounter (INDEPENDENT_AMBULATORY_CARE_PROVIDER_SITE_OTHER): Payer: Self-pay | Admitting: *Deleted

## 2011-02-11 ENCOUNTER — Ambulatory Visit: Payer: PRIVATE HEALTH INSURANCE | Attending: Pulmonary Disease

## 2011-02-11 DIAGNOSIS — R5381 Other malaise: Secondary | ICD-10-CM | POA: Insufficient documentation

## 2011-02-11 DIAGNOSIS — G4733 Obstructive sleep apnea (adult) (pediatric): Secondary | ICD-10-CM | POA: Insufficient documentation

## 2011-02-11 DIAGNOSIS — R0989 Other specified symptoms and signs involving the circulatory and respiratory systems: Secondary | ICD-10-CM | POA: Insufficient documentation

## 2011-02-11 DIAGNOSIS — R0609 Other forms of dyspnea: Secondary | ICD-10-CM | POA: Insufficient documentation

## 2011-02-14 LAB — CARDIAC PANEL(CRET KIN+CKTOT+MB+TROPI)
CK, MB: 1.3 ng/mL (ref 0.3–4.0)
CK, MB: 1.3 ng/mL (ref 0.3–4.0)
CK, MB: 1.4 ng/mL (ref 0.3–4.0)
CK, MB: 1.5 ng/mL (ref 0.3–4.0)
CK, MB: 1.5 ng/mL (ref 0.3–4.0)
CK, MB: 1.7 ng/mL (ref 0.3–4.0)
Relative Index: INVALID (ref 0.0–2.5)
Relative Index: INVALID (ref 0.0–2.5)
Relative Index: INVALID (ref 0.0–2.5)
Relative Index: INVALID (ref 0.0–2.5)
Total CK: 55 U/L (ref 7–177)
Total CK: 57 U/L (ref 7–177)
Total CK: 58 U/L (ref 7–177)
Total CK: 61 U/L (ref 7–177)
Total CK: 61 U/L (ref 7–177)
Total CK: 62 U/L (ref 7–177)
Total CK: 62 U/L (ref 7–177)
Total CK: 69 U/L (ref 7–177)
Troponin I: 0.02 ng/mL (ref 0.00–0.06)
Troponin I: 0.02 ng/mL (ref 0.00–0.06)
Troponin I: <0.01 ng/mL (ref 0.00–0.06)

## 2011-02-14 LAB — GLUCOSE, CAPILLARY
Glucose-Capillary: 109 mg/dL — ABNORMAL HIGH (ref 70–99)
Glucose-Capillary: 112 mg/dL — ABNORMAL HIGH (ref 70–99)
Glucose-Capillary: 129 mg/dL — ABNORMAL HIGH (ref 70–99)
Glucose-Capillary: 95 mg/dL (ref 70–99)
Glucose-Capillary: 112 mg/dL — ABNORMAL HIGH (ref 70–99)
Glucose-Capillary: 118 mg/dL — ABNORMAL HIGH (ref 70–99)
Glucose-Capillary: 120 mg/dL — ABNORMAL HIGH (ref 70–99)
Glucose-Capillary: 151 mg/dL — ABNORMAL HIGH (ref 70–99)

## 2011-02-14 LAB — URINALYSIS, ROUTINE W REFLEX MICROSCOPIC
Glucose, UA: NEGATIVE mg/dL
Hgb urine dipstick: NEGATIVE
Specific Gravity, Urine: 1.017 (ref 1.005–1.030)
Urobilinogen, UA: 1 mg/dL (ref 0.0–1.0)

## 2011-02-14 LAB — URINE CULTURE: Culture  Setup Time: 201201022045

## 2011-02-14 LAB — BASIC METABOLIC PANEL
BUN: 24 mg/dL — ABNORMAL HIGH (ref 6–23)
CO2: 26 mEq/L (ref 19–32)
Calcium: 8.9 mg/dL (ref 8.4–10.5)
Chloride: 108 mEq/L (ref 96–112)
Creatinine, Ser: 0.66 mg/dL (ref 0.4–1.2)
Creatinine, Ser: 0.96 mg/dL (ref 0.4–1.2)
GFR calc Af Amer: >60 mL/min (ref >60)
GFR calc Af Amer: >60 mL/min (ref >60)
GFR calc non Af Amer: >60 mL/min (ref >60)
Glucose, Bld: 110 mg/dL — ABNORMAL HIGH (ref 70–99)
Potassium: 4.1 mEq/L (ref 3.5–5.1)

## 2011-02-14 LAB — DIFFERENTIAL
Basophils Absolute: 0.1 10*3/uL (ref 0.0–0.1)
Basophils Relative: 1 % (ref 0–1)
Eosinophils Absolute: 0.4 10*3/uL (ref 0.0–0.7)
Eosinophils Relative: 4 % (ref 0–5)
Neutrophils Relative %: 63 % (ref 43–77)

## 2011-02-14 LAB — CBC
MCH: 28.8 pg (ref 26.0–34.0)
MCHC: 33.6 g/dL (ref 30.0–36.0)
MCV: 87.4 fL (ref 78.0–100.0)
MCV: 85.6 fL (ref 78.0–100.0)
Platelets: 121 10*3/uL — ABNORMAL LOW (ref 150–400)
Platelets: 160 10*3/uL (ref 150–400)
RBC: 3.9 MIL/uL (ref 3.87–5.11)
RBC: 3.75 MIL/uL — ABNORMAL LOW (ref 3.87–5.11)
RDW: 14.9 % (ref 11.5–15.5)
WBC: 6.6 10*3/uL (ref 4.0–10.5)

## 2011-02-14 LAB — BRAIN NATRIURETIC PEPTIDE: Pro B Natriuretic peptide (BNP): 46.8 pg/mL (ref 0.0–100.0)

## 2011-02-14 LAB — HEMOCCULT GUIAC POC 1CARD (OFFICE): Fecal Occult Bld: NEGATIVE

## 2011-02-14 LAB — POCT CARDIAC MARKERS

## 2011-02-15 LAB — CREATININE, SERUM
Creatinine, Ser: 0.75 mg/dL (ref 0.4–1.2)
GFR calc non Af Amer: >60 mL/min (ref >60)

## 2011-02-15 NOTE — Letter (Signed)
Summary: Generic Letter  Architectural technologist at [place]  [address] [place] [address]   Phone: [phone]  Fax: [phone]        February 07, 2011 MRN: [id number]    [facility] [address] [place], [place]  [address]    Dear [facility] Of Social Services:  The cardiology services that we provide are not provided in [facility]. The patient has been with [facility] since 2003 and wishes to remain with this practice.         Sincerely,  Dr. Wa[place]apu Bing, MD, [facility]  This letter has been electronically signed by your physician.

## 2011-02-17 ENCOUNTER — Ambulatory Visit: Payer: PRIVATE HEALTH INSURANCE | Admitting: Internal Medicine

## 2011-02-21 LAB — COMPREHENSIVE METABOLIC PANEL
ALT: 14 U/L (ref 0–35)
Alkaline Phosphatase: 126 U/L — ABNORMAL HIGH (ref 39–117)
CO2: 24 mEq/L (ref 19–32)
Chloride: 110 mEq/L (ref 96–112)
GFR calc non Af Amer: >60 mL/min (ref >60)
Glucose, Bld: 131 mg/dL — ABNORMAL HIGH (ref 70–99)
Potassium: 4.1 mEq/L (ref 3.5–5.1)
Sodium: 142 mEq/L (ref 135–145)
Total Bilirubin: 0.2 mg/dL — ABNORMAL LOW (ref 0.3–1.2)

## 2011-02-21 LAB — DIFFERENTIAL
Basophils Relative: 0 % (ref 0–1)
Eosinophils Absolute: 0.3 10*3/uL (ref 0.0–0.7)
Monocytes Relative: 9 % (ref 3–12)
Neutrophils Relative %: 69 % (ref 43–77)

## 2011-02-21 LAB — PHENYTOIN LEVEL, TOTAL: Phenytoin Lvl: 4.9 ug/mL — ABNORMAL LOW (ref 10.0–20.0)

## 2011-02-21 LAB — URINALYSIS, ROUTINE W REFLEX MICROSCOPIC
Bilirubin Urine: NEGATIVE
Hgb urine dipstick: NEGATIVE
Specific Gravity, Urine: 1.01 (ref 1.005–1.030)
Urobilinogen, UA: 0.2 mg/dL (ref 0.0–1.0)

## 2011-02-21 LAB — CBC
Hemoglobin: 10.8 g/dL — ABNORMAL LOW (ref 12.0–15.0)
RBC: 3.71 MIL/uL — ABNORMAL LOW (ref 3.87–5.11)

## 2011-02-21 LAB — LIPASE, BLOOD: Lipase: 46 U/L (ref 11–59)

## 2011-02-22 LAB — COMPREHENSIVE METABOLIC PANEL
AST: 22 U/L (ref 0–37)
CO2: 25 mEq/L (ref 19–32)
Calcium: 9.7 mg/dL (ref 8.4–10.5)
Creatinine, Ser: 0.66 mg/dL (ref 0.4–1.2)
GFR calc Af Amer: >60 mL/min (ref >60)
GFR calc non Af Amer: >60 mL/min (ref >60)
Glucose, Bld: 78 mg/dL (ref 70–99)

## 2011-02-22 LAB — GLUCOSE, CAPILLARY
Glucose-Capillary: 114 mg/dL — ABNORMAL HIGH (ref 70–99)
Glucose-Capillary: 118 mg/dL — ABNORMAL HIGH (ref 70–99)
Glucose-Capillary: 124 mg/dL — ABNORMAL HIGH (ref 70–99)
Glucose-Capillary: 139 mg/dL — ABNORMAL HIGH (ref 70–99)
Glucose-Capillary: 148 mg/dL — ABNORMAL HIGH (ref 70–99)
Glucose-Capillary: 183 mg/dL — ABNORMAL HIGH (ref 70–99)
Glucose-Capillary: 96 mg/dL (ref 70–99)

## 2011-02-22 LAB — DIFFERENTIAL
Lymphocytes Relative: 30 % (ref 12–46)
Lymphs Abs: 2.5 10*3/uL (ref 0.7–4.0)
Neutro Abs: 4.8 10*3/uL (ref 1.7–7.7)
Neutrophils Relative %: 59 % (ref 43–77)

## 2011-02-22 LAB — PROTIME-INR
INR: 1.12 (ref 0.00–1.49)
Prothrombin Time: 14.3 seconds (ref 11.6–15.2)

## 2011-02-22 LAB — CBC
MCHC: 33.5 g/dL (ref 30.0–36.0)
MCV: 88.9 fL (ref 78.0–100.0)
RBC: 3.94 MIL/uL (ref 3.87–5.11)

## 2011-02-22 LAB — SURGICAL PCR SCREEN
MRSA, PCR: NEGATIVE
Staphylococcus aureus: POSITIVE — AB

## 2011-02-22 LAB — APTT: aPTT: 31 seconds (ref 24–37)

## 2011-02-23 LAB — CARDIAC PANEL(CRET KIN+CKTOT+MB+TROPI)
CK, MB: 1.2 ng/mL (ref 0.3–4.0)
CK, MB: 1.3 ng/mL (ref 0.3–4.0)
Total CK: 39 U/L (ref 7–177)

## 2011-02-23 LAB — BASIC METABOLIC PANEL
BUN: 15 mg/dL (ref 6–23)
Calcium: 8.8 mg/dL (ref 8.4–10.5)
Creatinine, Ser: 0.67 mg/dL (ref 0.4–1.2)
GFR calc non Af Amer: >60 mL/min (ref >60)
Glucose, Bld: 124 mg/dL — ABNORMAL HIGH (ref 70–99)

## 2011-02-23 LAB — DIFFERENTIAL
Basophils Absolute: 0 10*3/uL (ref 0.0–0.1)
Eosinophils Relative: 2 % (ref 0–5)
Lymphocytes Relative: 19 % (ref 12–46)
Neutro Abs: 8.9 10*3/uL — ABNORMAL HIGH (ref 1.7–7.7)
Neutrophils Relative %: 72 % (ref 43–77)

## 2011-02-23 LAB — POCT CARDIAC MARKERS
Myoglobin, poc: 48.2 ng/mL (ref 12–200)
Troponin i, poc: <0.05 ng/mL (ref 0.00–0.09)

## 2011-02-23 LAB — CBC
Platelets: 169 10*3/uL (ref 150–400)
RDW: 17.4 % — ABNORMAL HIGH (ref 11.5–15.5)

## 2011-03-04 ENCOUNTER — Other Ambulatory Visit (HOSPITAL_COMMUNITY): Payer: Self-pay | Admitting: Pulmonary Disease

## 2011-03-04 DIAGNOSIS — R609 Edema, unspecified: Secondary | ICD-10-CM

## 2011-03-08 ENCOUNTER — Encounter (HOSPITAL_COMMUNITY): Payer: Self-pay

## 2011-03-08 ENCOUNTER — Ambulatory Visit (HOSPITAL_COMMUNITY)
Admission: RE | Admit: 2011-03-08 | Discharge: 2011-03-08 | Disposition: A | Discharge status: Home / Self Care | Payer: PRIVATE HEALTH INSURANCE | Source: Ambulatory Visit | Attending: Pulmonary Disease | Admitting: Pulmonary Disease

## 2011-03-08 DIAGNOSIS — R109 Unspecified abdominal pain: Secondary | ICD-10-CM | POA: Insufficient documentation

## 2011-03-08 DIAGNOSIS — R609 Edema, unspecified: Secondary | ICD-10-CM

## 2011-03-08 DIAGNOSIS — R1900 Intra-abdominal and pelvic swelling, mass and lump, unspecified site: Secondary | ICD-10-CM | POA: Insufficient documentation

## 2011-03-08 MED ORDER — IOHEXOL 300 MG/ML  SOLN
100.0000 mL | Freq: Once | INTRAMUSCULAR | Status: AC | PRN
  Administered 2011-03-08: 100 mL via INTRAVENOUS

## 2011-03-12 LAB — BASIC METABOLIC PANEL
BUN: 13 mg/dL (ref 6–23)
Calcium: 8.5 mg/dL (ref 8.4–10.5)
GFR calc non Af Amer: 46 mL/min — ABNORMAL LOW (ref >60)
Glucose, Bld: 119 mg/dL — ABNORMAL HIGH (ref 70–99)
Sodium: 131 mEq/L — ABNORMAL LOW (ref 135–145)

## 2011-03-12 LAB — GLUCOSE, CAPILLARY
Glucose-Capillary: 121 mg/dL — ABNORMAL HIGH (ref 70–99)
Glucose-Capillary: 125 mg/dL — ABNORMAL HIGH (ref 70–99)
Glucose-Capillary: 126 mg/dL — ABNORMAL HIGH (ref 70–99)

## 2011-03-12 LAB — CBC
Hemoglobin: 9.7 g/dL — ABNORMAL LOW (ref 12.0–15.0)
Platelets: 202 10*3/uL (ref 150–400)
RDW: 14.6 % (ref 11.5–15.5)

## 2011-03-13 LAB — GLUCOSE, CAPILLARY
Glucose-Capillary: 102 mg/dL — ABNORMAL HIGH (ref 70–99)
Glucose-Capillary: 104 mg/dL — ABNORMAL HIGH (ref 70–99)
Glucose-Capillary: 107 mg/dL — ABNORMAL HIGH (ref 70–99)
Glucose-Capillary: 111 mg/dL — ABNORMAL HIGH (ref 70–99)
Glucose-Capillary: 114 mg/dL — ABNORMAL HIGH (ref 70–99)
Glucose-Capillary: 114 mg/dL — ABNORMAL HIGH (ref 70–99)
Glucose-Capillary: 117 mg/dL — ABNORMAL HIGH (ref 70–99)
Glucose-Capillary: 117 mg/dL — ABNORMAL HIGH (ref 70–99)
Glucose-Capillary: 118 mg/dL — ABNORMAL HIGH (ref 70–99)
Glucose-Capillary: 118 mg/dL — ABNORMAL HIGH (ref 70–99)
Glucose-Capillary: 119 mg/dL — ABNORMAL HIGH (ref 70–99)
Glucose-Capillary: 131 mg/dL — ABNORMAL HIGH (ref 70–99)
Glucose-Capillary: 135 mg/dL — ABNORMAL HIGH (ref 70–99)
Glucose-Capillary: 136 mg/dL — ABNORMAL HIGH (ref 70–99)
Glucose-Capillary: 139 mg/dL — ABNORMAL HIGH (ref 70–99)
Glucose-Capillary: 156 mg/dL — ABNORMAL HIGH (ref 70–99)
Glucose-Capillary: 158 mg/dL — ABNORMAL HIGH (ref 70–99)
Glucose-Capillary: 30 mg/dL — CL (ref 70–99)
Glucose-Capillary: 80 mg/dL (ref 70–99)
Glucose-Capillary: 97 mg/dL (ref 70–99)
Glucose-Capillary: 104 mg/dL — ABNORMAL HIGH (ref 70–99)
Glucose-Capillary: 109 mg/dL — ABNORMAL HIGH (ref 70–99)
Glucose-Capillary: 110 mg/dL — ABNORMAL HIGH (ref 70–99)
Glucose-Capillary: 111 mg/dL — ABNORMAL HIGH (ref 70–99)
Glucose-Capillary: 112 mg/dL — ABNORMAL HIGH (ref 70–99)
Glucose-Capillary: 114 mg/dL — ABNORMAL HIGH (ref 70–99)
Glucose-Capillary: 115 mg/dL — ABNORMAL HIGH (ref 70–99)
Glucose-Capillary: 117 mg/dL — ABNORMAL HIGH (ref 70–99)
Glucose-Capillary: 118 mg/dL — ABNORMAL HIGH (ref 70–99)
Glucose-Capillary: 125 mg/dL — ABNORMAL HIGH (ref 70–99)
Glucose-Capillary: 129 mg/dL — ABNORMAL HIGH (ref 70–99)
Glucose-Capillary: 130 mg/dL — ABNORMAL HIGH (ref 70–99)
Glucose-Capillary: 132 mg/dL — ABNORMAL HIGH (ref 70–99)
Glucose-Capillary: 167 mg/dL — ABNORMAL HIGH (ref 70–99)
Glucose-Capillary: 168 mg/dL — ABNORMAL HIGH (ref 70–99)
Glucose-Capillary: 92 mg/dL (ref 70–99)
Glucose-Capillary: 93 mg/dL (ref 70–99)
Glucose-Capillary: 95 mg/dL (ref 70–99)
Glucose-Capillary: 95 mg/dL (ref 70–99)
Glucose-Capillary: 96 mg/dL (ref 70–99)

## 2011-03-13 LAB — BASIC METABOLIC PANEL
BUN: 13 mg/dL (ref 6–23)
BUN: 15 mg/dL (ref 6–23)
BUN: 18 mg/dL (ref 6–23)
BUN: 5 mg/dL — ABNORMAL LOW (ref 6–23)
BUN: 7 mg/dL (ref 6–23)
BUN: 8 mg/dL (ref 6–23)
BUN: 10 mg/dL (ref 6–23)
BUN: 11 mg/dL (ref 6–23)
BUN: 11 mg/dL (ref 6–23)
BUN: 5 mg/dL — ABNORMAL LOW (ref 6–23)
BUN: 6 mg/dL (ref 6–23)
BUN: 8 mg/dL (ref 6–23)
BUN: 8 mg/dL (ref 6–23)
CO2: 23 mEq/L (ref 19–32)
CO2: 24 mEq/L (ref 19–32)
CO2: 26 mEq/L (ref 19–32)
CO2: 26 mEq/L (ref 19–32)
CO2: 27 mEq/L (ref 19–32)
CO2: 27 mEq/L (ref 19–32)
CO2: 27 mEq/L (ref 19–32)
Calcium: 8.8 mg/dL (ref 8.4–10.5)
Calcium: 8.9 mg/dL (ref 8.4–10.5)
Calcium: 8.3 mg/dL — ABNORMAL LOW (ref 8.4–10.5)
Calcium: 8.4 mg/dL (ref 8.4–10.5)
Calcium: 8.4 mg/dL (ref 8.4–10.5)
Calcium: 8.5 mg/dL (ref 8.4–10.5)
Calcium: 8.7 mg/dL (ref 8.4–10.5)
Chloride: 102 mEq/L (ref 96–112)
Chloride: 103 mEq/L (ref 96–112)
Chloride: 105 mEq/L (ref 96–112)
Chloride: 107 mEq/L (ref 96–112)
Chloride: 103 mEq/L (ref 96–112)
Chloride: 105 mEq/L (ref 96–112)
Chloride: 107 mEq/L (ref 96–112)
Chloride: 107 mEq/L (ref 96–112)
Chloride: 109 mEq/L (ref 96–112)
Chloride: 111 mEq/L (ref 96–112)
Creatinine, Ser: 0.87 mg/dL (ref 0.4–1.2)
Creatinine, Ser: 1.03 mg/dL (ref 0.4–1.2)
Creatinine, Ser: 1.34 mg/dL — ABNORMAL HIGH (ref 0.4–1.2)
Creatinine, Ser: 2.26 mg/dL — ABNORMAL HIGH (ref 0.4–1.2)
Creatinine, Ser: 0.68 mg/dL (ref 0.4–1.2)
Creatinine, Ser: 0.71 mg/dL (ref 0.4–1.2)
Creatinine, Ser: 0.77 mg/dL (ref 0.4–1.2)
Creatinine, Ser: 0.78 mg/dL (ref 0.4–1.2)
Creatinine, Ser: 0.85 mg/dL (ref 0.4–1.2)
Creatinine, Ser: 0.87 mg/dL (ref 0.4–1.2)
GFR calc Af Amer: 26 mL/min — ABNORMAL LOW (ref >60)
GFR calc Af Amer: 47 mL/min — ABNORMAL LOW (ref >60)
GFR calc Af Amer: >60 mL/min (ref >60)
GFR calc Af Amer: >60 mL/min (ref >60)
GFR calc Af Amer: >60 mL/min (ref >60)
GFR calc Af Amer: >60 mL/min (ref >60)
GFR calc Af Amer: >60 mL/min (ref >60)
GFR calc Af Amer: >60 mL/min (ref >60)
GFR calc Af Amer: >60 mL/min (ref >60)
GFR calc non Af Amer: 21 mL/min — ABNORMAL LOW (ref >60)
GFR calc non Af Amer: 35 mL/min — ABNORMAL LOW (ref >60)
GFR calc non Af Amer: 39 mL/min — ABNORMAL LOW (ref >60)
GFR calc non Af Amer: 53 mL/min — ABNORMAL LOW (ref >60)
GFR calc non Af Amer: 58 mL/min — ABNORMAL LOW (ref >60)
GFR calc non Af Amer: >60 mL/min (ref >60)
GFR calc non Af Amer: >60 mL/min (ref >60)
GFR calc non Af Amer: >60 mL/min (ref >60)
GFR calc non Af Amer: >60 mL/min (ref >60)
GFR calc non Af Amer: >60 mL/min (ref >60)
GFR calc non Af Amer: >60 mL/min (ref >60)
GFR calc non Af Amer: >60 mL/min (ref >60)
Glucose, Bld: 104 mg/dL — ABNORMAL HIGH (ref 70–99)
Glucose, Bld: 164 mg/dL — ABNORMAL HIGH (ref 70–99)
Glucose, Bld: 105 mg/dL — ABNORMAL HIGH (ref 70–99)
Glucose, Bld: 105 mg/dL — ABNORMAL HIGH (ref 70–99)
Glucose, Bld: 111 mg/dL — ABNORMAL HIGH (ref 70–99)
Glucose, Bld: 114 mg/dL — ABNORMAL HIGH (ref 70–99)
Glucose, Bld: 159 mg/dL — ABNORMAL HIGH (ref 70–99)
Glucose, Bld: 181 mg/dL — ABNORMAL HIGH (ref 70–99)
Potassium: 3.6 mEq/L (ref 3.5–5.1)
Potassium: 3.6 mEq/L (ref 3.5–5.1)
Potassium: 3.7 mEq/L (ref 3.5–5.1)
Potassium: 3.7 mEq/L (ref 3.5–5.1)
Potassium: 4.8 mEq/L (ref 3.5–5.1)
Potassium: 5.1 mEq/L (ref 3.5–5.1)
Potassium: 5.6 mEq/L — ABNORMAL HIGH (ref 3.5–5.1)
Potassium: 3 mEq/L — ABNORMAL LOW (ref 3.5–5.1)
Potassium: 3.4 mEq/L — ABNORMAL LOW (ref 3.5–5.1)
Potassium: 3.5 mEq/L (ref 3.5–5.1)
Potassium: 3.9 mEq/L (ref 3.5–5.1)
Potassium: 4.1 mEq/L (ref 3.5–5.1)
Potassium: 4.3 mEq/L (ref 3.5–5.1)
Sodium: 135 mEq/L (ref 135–145)
Sodium: 138 mEq/L (ref 135–145)
Sodium: 140 mEq/L (ref 135–145)
Sodium: 142 mEq/L (ref 135–145)
Sodium: 137 mEq/L (ref 135–145)
Sodium: 137 mEq/L (ref 135–145)
Sodium: 139 mEq/L (ref 135–145)
Sodium: 140 mEq/L (ref 135–145)

## 2011-03-13 LAB — CBC
HCT: 26.2 % — ABNORMAL LOW (ref 36.0–46.0)
HCT: 30.2 % — ABNORMAL LOW (ref 36.0–46.0)
HCT: 30.8 % — ABNORMAL LOW (ref 36.0–46.0)
HCT: 31.5 % — ABNORMAL LOW (ref 36.0–46.0)
HCT: 31.9 % — ABNORMAL LOW (ref 36.0–46.0)
HCT: 32.7 % — ABNORMAL LOW (ref 36.0–46.0)
HCT: 34.3 % — ABNORMAL LOW (ref 36.0–46.0)
HCT: 38.3 % (ref 36.0–46.0)
HCT: 25 % — ABNORMAL LOW (ref 36.0–46.0)
HCT: 25.5 % — ABNORMAL LOW (ref 36.0–46.0)
HCT: 25.6 % — ABNORMAL LOW (ref 36.0–46.0)
HCT: 25.8 % — ABNORMAL LOW (ref 36.0–46.0)
HCT: 26.6 % — ABNORMAL LOW (ref 36.0–46.0)
HCT: 26.7 % — ABNORMAL LOW (ref 36.0–46.0)
HCT: 27.7 % — ABNORMAL LOW (ref 36.0–46.0)
HCT: 28.5 % — ABNORMAL LOW (ref 36.0–46.0)
Hemoglobin: 10.1 g/dL — ABNORMAL LOW (ref 12.0–15.0)
Hemoglobin: 10.7 g/dL — ABNORMAL LOW (ref 12.0–15.0)
Hemoglobin: 10.7 g/dL — ABNORMAL LOW (ref 12.0–15.0)
Hemoglobin: 11 g/dL — ABNORMAL LOW (ref 12.0–15.0)
Hemoglobin: 12.7 g/dL (ref 12.0–15.0)
Hemoglobin: 8.3 g/dL — ABNORMAL LOW (ref 12.0–15.0)
Hemoglobin: 8.6 g/dL — ABNORMAL LOW (ref 12.0–15.0)
Hemoglobin: 8.9 g/dL — ABNORMAL LOW (ref 12.0–15.0)
Hemoglobin: 9.3 g/dL — ABNORMAL LOW (ref 12.0–15.0)
Hemoglobin: 9.5 g/dL — ABNORMAL LOW (ref 12.0–15.0)
MCHC: 33.5 g/dL (ref 30.0–36.0)
MCHC: 33.5 g/dL (ref 30.0–36.0)
MCHC: 33.5 g/dL (ref 30.0–36.0)
MCHC: 33.2 g/dL (ref 30.0–36.0)
MCHC: 33.3 g/dL (ref 30.0–36.0)
MCHC: 33.7 g/dL (ref 30.0–36.0)
MCHC: 33.7 g/dL (ref 30.0–36.0)
MCHC: 33.8 g/dL (ref 30.0–36.0)
MCHC: 33.8 g/dL (ref 30.0–36.0)
MCV: 87.8 fL (ref 78.0–100.0)
MCV: 88.7 fL (ref 78.0–100.0)
MCV: 89.1 fL (ref 78.0–100.0)
MCV: 87.7 fL (ref 78.0–100.0)
MCV: 87.8 fL (ref 78.0–100.0)
MCV: 87.8 fL (ref 78.0–100.0)
MCV: 88.1 fL (ref 78.0–100.0)
MCV: 88.4 fL (ref 78.0–100.0)
MCV: 88.4 fL (ref 78.0–100.0)
Platelets: 200 10*3/uL (ref 150–400)
Platelets: 223 10*3/uL (ref 150–400)
Platelets: 240 10*3/uL (ref 150–400)
Platelets: 242 10*3/uL (ref 150–400)
Platelets: 247 10*3/uL (ref 150–400)
Platelets: 268 10*3/uL (ref 150–400)
Platelets: 282 10*3/uL (ref 150–400)
Platelets: 308 10*3/uL (ref 150–400)
Platelets: 246 10*3/uL (ref 150–400)
Platelets: 248 10*3/uL (ref 150–400)
Platelets: 277 10*3/uL (ref 150–400)
Platelets: 293 10*3/uL (ref 150–400)
Platelets: 300 10*3/uL (ref 150–400)
Platelets: 312 10*3/uL (ref 150–400)
Platelets: 353 10*3/uL (ref 150–400)
Platelets: 373 10*3/uL (ref 150–400)
RBC: 2.98 MIL/uL — ABNORMAL LOW (ref 3.87–5.11)
RBC: 3.47 MIL/uL — ABNORMAL LOW (ref 3.87–5.11)
RBC: 3.67 MIL/uL — ABNORMAL LOW (ref 3.87–5.11)
RBC: 3.75 MIL/uL — ABNORMAL LOW (ref 3.87–5.11)
RBC: 3.84 MIL/uL — ABNORMAL LOW (ref 3.87–5.11)
RBC: 2.83 MIL/uL — ABNORMAL LOW (ref 3.87–5.11)
RBC: 2.9 MIL/uL — ABNORMAL LOW (ref 3.87–5.11)
RBC: 2.91 MIL/uL — ABNORMAL LOW (ref 3.87–5.11)
RBC: 3.03 MIL/uL — ABNORMAL LOW (ref 3.87–5.11)
RBC: 3.14 MIL/uL — ABNORMAL LOW (ref 3.87–5.11)
RBC: 3.24 MIL/uL — ABNORMAL LOW (ref 3.87–5.11)
RDW: 14.7 % (ref 11.5–15.5)
RDW: 14.9 % (ref 11.5–15.5)
RDW: 15.3 % (ref 11.5–15.5)
RDW: 15.9 % — ABNORMAL HIGH (ref 11.5–15.5)
RDW: 15.9 % — ABNORMAL HIGH (ref 11.5–15.5)
RDW: 15.9 % — ABNORMAL HIGH (ref 11.5–15.5)
RDW: 16.1 % — ABNORMAL HIGH (ref 11.5–15.5)
RDW: 16.3 % — ABNORMAL HIGH (ref 11.5–15.5)
RDW: 16.5 % — ABNORMAL HIGH (ref 11.5–15.5)
RDW: 16.6 % — ABNORMAL HIGH (ref 11.5–15.5)
WBC: 11.4 10*3/uL — ABNORMAL HIGH (ref 4.0–10.5)
WBC: 12.1 10*3/uL — ABNORMAL HIGH (ref 4.0–10.5)
WBC: 14.5 10*3/uL — ABNORMAL HIGH (ref 4.0–10.5)
WBC: 16.8 10*3/uL — ABNORMAL HIGH (ref 4.0–10.5)
WBC: 5.9 10*3/uL (ref 4.0–10.5)
WBC: 7.3 10*3/uL (ref 4.0–10.5)
WBC: 8.5 10*3/uL (ref 4.0–10.5)
WBC: 9.6 10*3/uL (ref 4.0–10.5)
WBC: 9.8 10*3/uL (ref 4.0–10.5)
WBC: 13.4 10*3/uL — ABNORMAL HIGH (ref 4.0–10.5)
WBC: 15.3 10*3/uL — ABNORMAL HIGH (ref 4.0–10.5)
WBC: 17.5 10*3/uL — ABNORMAL HIGH (ref 4.0–10.5)
WBC: 8.4 10*3/uL (ref 4.0–10.5)
WBC: 9.4 10*3/uL (ref 4.0–10.5)
WBC: 9.6 10*3/uL (ref 4.0–10.5)
WBC: 9.9 10*3/uL (ref 4.0–10.5)

## 2011-03-13 LAB — MRSA PCR SCREENING: MRSA by PCR: NEGATIVE

## 2011-03-13 LAB — DIFFERENTIAL
Basophils Absolute: 0.1 10*3/uL (ref 0.0–0.1)
Basophils Relative: 1 % (ref 0–1)
Eosinophils Absolute: 1.4 10*3/uL — ABNORMAL HIGH (ref 0.0–0.7)
Eosinophils Relative: 14 % — ABNORMAL HIGH (ref 0–5)
Eosinophils Relative: 7 % — ABNORMAL HIGH (ref 0–5)
Lymphocytes Relative: 17 % (ref 12–46)
Lymphocytes Relative: 11 % — ABNORMAL LOW (ref 12–46)
Lymphs Abs: 1.7 10*3/uL (ref 0.7–4.0)
Lymphs Abs: 1.6 10*3/uL (ref 0.7–4.0)
Monocytes Absolute: 0.9 10*3/uL (ref 0.1–1.0)
Monocytes Relative: 9 % (ref 3–12)
Neutro Abs: 5.8 10*3/uL (ref 1.7–7.7)
Neutrophils Relative %: 59 % (ref 43–77)

## 2011-03-13 LAB — POCT I-STAT 3, ART BLOOD GAS (G3+)
Acid-base deficit: 2 mmol/L (ref 0.0–2.0)
Acid-base deficit: 7 mmol/L — ABNORMAL HIGH (ref 0.0–2.0)
Bicarbonate: 17 mEq/L — ABNORMAL LOW (ref 20.0–24.0)
O2 Saturation: 85 %
O2 Saturation: 98 %
Patient temperature: 99.7
TCO2: 23 mmol/L (ref 0–100)
pO2, Arterial: 97 mmHg (ref 80.0–100.0)

## 2011-03-13 LAB — WOUND CULTURE
Culture: NO GROWTH
Gram Stain: NONE SEEN

## 2011-03-13 LAB — CROSSMATCH
ABO/RH(D): O NEG
Antibody Screen: NEGATIVE

## 2011-03-13 LAB — CULTURE, BLOOD (ROUTINE X 2)
Culture: NO GROWTH
Culture: NO GROWTH

## 2011-03-13 LAB — PREALBUMIN: Prealbumin: 9.7 mg/dL — ABNORMAL LOW (ref 18.0–45.0)

## 2011-03-13 LAB — COMPREHENSIVE METABOLIC PANEL
ALT: 29 U/L (ref 0–35)
AST: 27 U/L (ref 0–37)
Albumin: 2.6 g/dL — ABNORMAL LOW (ref 3.5–5.2)
Calcium: 8.5 mg/dL (ref 8.4–10.5)
GFR calc Af Amer: >60 mL/min (ref >60)
Potassium: 3.8 mEq/L (ref 3.5–5.1)
Sodium: 140 mEq/L (ref 135–145)
Total Protein: 6.5 g/dL (ref 6.0–8.3)

## 2011-03-13 LAB — TYPE AND SCREEN
ABO/RH(D): O NEG
Antibody Screen: NEGATIVE

## 2011-03-13 LAB — ANAEROBIC CULTURE

## 2011-03-13 LAB — URINALYSIS, MICROSCOPIC ONLY
Bilirubin Urine: NEGATIVE
Hgb urine dipstick: NEGATIVE
Ketones, ur: NEGATIVE mg/dL
Specific Gravity, Urine: 1.005 (ref 1.005–1.030)
Urobilinogen, UA: 0.2 mg/dL (ref 0.0–1.0)

## 2011-03-13 LAB — POCT I-STAT, CHEM 8
BUN: 10 mg/dL (ref 6–23)
Hemoglobin: 10.2 g/dL — ABNORMAL LOW (ref 12.0–15.0)
Sodium: 141 mEq/L (ref 135–145)
TCO2: 22 mmol/L (ref 0–100)

## 2011-03-13 LAB — VANCOMYCIN, TROUGH: Vancomycin Tr: 16.4 ug/mL (ref 10.0–20.0)

## 2011-03-13 LAB — TISSUE CULTURE: Gram Stain: NONE SEEN

## 2011-03-13 LAB — PROTIME-INR: Prothrombin Time: 15.5 seconds — ABNORMAL HIGH (ref 11.6–15.2)

## 2011-03-13 LAB — APTT: aPTT: 32 seconds (ref 24–37)

## 2011-03-14 LAB — POCT I-STAT 3, ART BLOOD GAS (G3+)
Acid-Base Excess: 1 mmol/L (ref 0.0–2.0)
Acid-base deficit: 1 mmol/L (ref 0.0–2.0)
Acid-base deficit: 1 mmol/L (ref 0.0–2.0)
Bicarbonate: 24.3 mEq/L — ABNORMAL HIGH (ref 20.0–24.0)
O2 Saturation: 100 %
O2 Saturation: 100 %
O2 Saturation: 97 %
O2 Saturation: 99 %
TCO2: 26 mmol/L (ref 0–100)
TCO2: 28 mmol/L (ref 0–100)
pCO2 arterial: 35.8 mmHg (ref 35.0–45.0)
pCO2 arterial: 37.1 mmHg (ref 35.0–45.0)
pCO2 arterial: 42.4 mmHg (ref 35.0–45.0)
pH, Arterial: 7.369 (ref 7.350–7.400)
pH, Arterial: 7.414 — ABNORMAL HIGH (ref 7.350–7.400)
pO2, Arterial: 127 mmHg — ABNORMAL HIGH (ref 80.0–100.0)
pO2, Arterial: 284 mmHg — ABNORMAL HIGH (ref 80.0–100.0)

## 2011-03-14 LAB — GLUCOSE, CAPILLARY
Glucose-Capillary: 109 mg/dL — ABNORMAL HIGH (ref 70–99)
Glucose-Capillary: 111 mg/dL — ABNORMAL HIGH (ref 70–99)
Glucose-Capillary: 113 mg/dL — ABNORMAL HIGH (ref 70–99)
Glucose-Capillary: 114 mg/dL — ABNORMAL HIGH (ref 70–99)
Glucose-Capillary: 123 mg/dL — ABNORMAL HIGH (ref 70–99)
Glucose-Capillary: 124 mg/dL — ABNORMAL HIGH (ref 70–99)
Glucose-Capillary: 130 mg/dL — ABNORMAL HIGH (ref 70–99)
Glucose-Capillary: 139 mg/dL — ABNORMAL HIGH (ref 70–99)
Glucose-Capillary: 142 mg/dL — ABNORMAL HIGH (ref 70–99)
Glucose-Capillary: 150 mg/dL — ABNORMAL HIGH (ref 70–99)
Glucose-Capillary: 162 mg/dL — ABNORMAL HIGH (ref 70–99)
Glucose-Capillary: 96 mg/dL (ref 70–99)

## 2011-03-14 LAB — CBC
HCT: 27.5 % — ABNORMAL LOW (ref 36.0–46.0)
HCT: 30.1 % — ABNORMAL LOW (ref 36.0–46.0)
Hemoglobin: 11.4 g/dL — ABNORMAL LOW (ref 12.0–15.0)
Hemoglobin: 9.1 g/dL — ABNORMAL LOW (ref 12.0–15.0)
MCHC: 33.3 g/dL (ref 30.0–36.0)
MCV: 87 fL (ref 78.0–100.0)
MCV: 88.6 fL (ref 78.0–100.0)
Platelets: 134 10*3/uL — ABNORMAL LOW (ref 150–400)
Platelets: 142 10*3/uL — ABNORMAL LOW (ref 150–400)
RBC: 3.11 MIL/uL — ABNORMAL LOW (ref 3.87–5.11)
RBC: 3.13 MIL/uL — ABNORMAL LOW (ref 3.87–5.11)
RBC: 3.4 MIL/uL — ABNORMAL LOW (ref 3.87–5.11)
RBC: 4.06 MIL/uL (ref 3.87–5.11)
RDW: 16 % — ABNORMAL HIGH (ref 11.5–15.5)
RDW: 16 % — ABNORMAL HIGH (ref 11.5–15.5)
RDW: 16.4 % — ABNORMAL HIGH (ref 11.5–15.5)
WBC: 11.8 10*3/uL — ABNORMAL HIGH (ref 4.0–10.5)
WBC: 12 10*3/uL — ABNORMAL HIGH (ref 4.0–10.5)
WBC: 12.2 10*3/uL — ABNORMAL HIGH (ref 4.0–10.5)
WBC: 12.9 10*3/uL — ABNORMAL HIGH (ref 4.0–10.5)

## 2011-03-14 LAB — BASIC METABOLIC PANEL
BUN: 13 mg/dL (ref 6–23)
Calcium: 8.1 mg/dL — ABNORMAL LOW (ref 8.4–10.5)
Creatinine, Ser: 0.94 mg/dL (ref 0.4–1.2)
GFR calc non Af Amer: 59 mL/min — ABNORMAL LOW (ref >60)
GFR calc non Af Amer: >60 mL/min (ref >60)
Glucose, Bld: 123 mg/dL — ABNORMAL HIGH (ref 70–99)
Potassium: 4.2 mEq/L (ref 3.5–5.1)
Sodium: 138 mEq/L (ref 135–145)

## 2011-03-14 LAB — APTT: aPTT: 35 seconds (ref 24–37)

## 2011-03-14 LAB — URINALYSIS, ROUTINE W REFLEX MICROSCOPIC
Bilirubin Urine: NEGATIVE
Hgb urine dipstick: NEGATIVE
Nitrite: NEGATIVE
Specific Gravity, Urine: 1.016 (ref 1.005–1.030)
pH: 7 (ref 5.0–8.0)

## 2011-03-14 LAB — POCT I-STAT 4, (NA,K, GLUC, HGB,HCT)
Glucose, Bld: 109 mg/dL — ABNORMAL HIGH (ref 70–99)
HCT: 26 % — ABNORMAL LOW (ref 36.0–46.0)
HCT: 31 % — ABNORMAL LOW (ref 36.0–46.0)
HCT: 34 % — ABNORMAL LOW (ref 36.0–46.0)
Hemoglobin: 10.5 g/dL — ABNORMAL LOW (ref 12.0–15.0)
Hemoglobin: 8.2 g/dL — ABNORMAL LOW (ref 12.0–15.0)
Hemoglobin: 8.8 g/dL — ABNORMAL LOW (ref 12.0–15.0)
Potassium: 3.9 mEq/L (ref 3.5–5.1)
Potassium: 4.3 mEq/L (ref 3.5–5.1)
Potassium: 4.9 mEq/L (ref 3.5–5.1)
Sodium: 135 mEq/L (ref 135–145)
Sodium: 138 mEq/L (ref 135–145)
Sodium: 140 mEq/L (ref 135–145)

## 2011-03-14 LAB — POCT I-STAT 3, VENOUS BLOOD GAS (G3P V)
Acid-base deficit: 1 mmol/L (ref 0.0–2.0)
O2 Saturation: 84 %

## 2011-03-14 LAB — URINALYSIS, MICROSCOPIC ONLY
Hgb urine dipstick: NEGATIVE
Leukocytes, UA: NEGATIVE
Protein, ur: NEGATIVE mg/dL
Urobilinogen, UA: 1 mg/dL (ref 0.0–1.0)
pH: 6 (ref 5.0–8.0)

## 2011-03-14 LAB — COMPREHENSIVE METABOLIC PANEL
ALT: 14 U/L (ref 0–35)
Alkaline Phosphatase: 113 U/L (ref 39–117)
CO2: 22 mEq/L (ref 19–32)
GFR calc non Af Amer: >60 mL/min (ref >60)
Glucose, Bld: 96 mg/dL (ref 70–99)
Potassium: 3.8 mEq/L (ref 3.5–5.1)
Sodium: 140 mEq/L (ref 135–145)

## 2011-03-14 LAB — PROTIME-INR
INR: 1.5 (ref 0.00–1.49)
Prothrombin Time: 18.8 seconds — ABNORMAL HIGH (ref 11.6–15.2)

## 2011-03-14 LAB — BLOOD GAS, ARTERIAL
Bicarbonate: 22.3 mEq/L (ref 20.0–24.0)
FIO2: 0.21 %
O2 Saturation: 98.8 %
pH, Arterial: 7.472 — ABNORMAL HIGH (ref 7.350–7.400)
pO2, Arterial: 116 mmHg — ABNORMAL HIGH (ref 80.0–100.0)

## 2011-03-14 LAB — POCT I-STAT, CHEM 8
BUN: 8 mg/dL (ref 6–23)
Calcium, Ion: 1.11 mmol/L — ABNORMAL LOW (ref 1.12–1.32)
TCO2: 23 mmol/L (ref 0–100)

## 2011-03-14 LAB — CREATININE, SERUM
GFR calc Af Amer: >60 mL/min (ref >60)
GFR calc non Af Amer: >60 mL/min (ref >60)

## 2011-03-14 LAB — TYPE AND SCREEN: Antibody Screen: NEGATIVE

## 2011-03-14 LAB — HEMOGLOBIN AND HEMATOCRIT, BLOOD
HCT: 22.7 % — ABNORMAL LOW (ref 36.0–46.0)
Hemoglobin: 7.7 g/dL — CL (ref 12.0–15.0)

## 2011-03-14 LAB — MAGNESIUM: Magnesium: 2.9 mg/dL — ABNORMAL HIGH (ref 1.5–2.5)

## 2011-03-14 LAB — URINE CULTURE: Culture: NO GROWTH

## 2011-03-14 LAB — HEMOGLOBIN A1C: Hgb A1c MFr Bld: 5.8 % (ref 4.6–6.1)

## 2011-03-14 LAB — PLATELET COUNT: Platelets: 124 10*3/uL — ABNORMAL LOW (ref 150–400)

## 2011-03-23 ENCOUNTER — Encounter: Payer: Self-pay | Admitting: Internal Medicine

## 2011-03-24 ENCOUNTER — Telehealth: Payer: Self-pay | Admitting: Internal Medicine

## 2011-03-24 ENCOUNTER — Ambulatory Visit (INDEPENDENT_AMBULATORY_CARE_PROVIDER_SITE_OTHER): Payer: PRIVATE HEALTH INSURANCE | Admitting: Internal Medicine

## 2011-03-24 ENCOUNTER — Encounter: Payer: Self-pay | Admitting: *Deleted

## 2011-03-24 ENCOUNTER — Other Ambulatory Visit: Payer: Self-pay | Admitting: *Deleted

## 2011-03-24 ENCOUNTER — Encounter: Payer: Self-pay | Admitting: Internal Medicine

## 2011-03-24 DIAGNOSIS — I441 Atrioventricular block, second degree: Secondary | ICD-10-CM

## 2011-03-24 DIAGNOSIS — R05 Cough: Secondary | ICD-10-CM

## 2011-03-24 DIAGNOSIS — I442 Atrioventricular block, complete: Secondary | ICD-10-CM

## 2011-03-24 DIAGNOSIS — I25810 Atherosclerosis of coronary artery bypass graft(s) without angina pectoris: Secondary | ICD-10-CM

## 2011-03-24 DIAGNOSIS — I10 Essential (primary) hypertension: Secondary | ICD-10-CM

## 2011-03-24 DIAGNOSIS — J209 Acute bronchitis, unspecified: Secondary | ICD-10-CM

## 2011-03-24 DIAGNOSIS — I2510 Atherosclerotic heart disease of native coronary artery without angina pectoris: Secondary | ICD-10-CM

## 2011-03-24 LAB — CBC WITH DIFFERENTIAL/PLATELET
Basophils Relative: 0.3 % (ref 0.0–3.0)
Eosinophils Relative: 0.2 % (ref 0.0–5.0)
Lymphocytes Relative: 12.2 % (ref 12.0–46.0)
MCV: 87.9 fl (ref 78.0–100.0)
Monocytes Absolute: 0.3 10*3/uL (ref 0.1–1.0)
Monocytes Relative: 3.3 % (ref 3.0–12.0)
Neutrophils Relative %: 84 % — ABNORMAL HIGH (ref 43.0–77.0)
RBC: 4.09 Mil/uL (ref 3.87–5.11)
WBC: 10 10*3/uL (ref 4.5–10.5)

## 2011-03-24 LAB — BASIC METABOLIC PANEL
Chloride: 106 mEq/L (ref 96–112)
Creatinine, Ser: 0.9 mg/dL (ref 0.4–1.2)
GFR: 76.27 mL/min (ref >60.00)

## 2011-03-24 LAB — PROTIME-INR
INR: 1.2 ratio — ABNORMAL HIGH (ref 0.8–1.0)
Prothrombin Time: 13 s — ABNORMAL HIGH (ref 10.2–12.4)

## 2011-03-24 MED ORDER — AZITHROMYCIN 250 MG PO TABS: ORAL_TABLET | ORAL | Status: AC

## 2011-03-24 MED ORDER — AZITHROMYCIN 250 MG PO TABS: ORAL_TABLET | ORAL | Status: DC

## 2011-03-24 NOTE — Patient Instructions (Signed)
Your physician has recommended that you have a pacemaker generator changed.  Will get labs today  Your physician has recommended you make the following change in your medication: Z-Pack take as directed

## 2011-03-24 NOTE — Telephone Encounter (Signed)
Pt saw dr Johney Frame today and was told a z pak would be called in for her today-not there yet

## 2011-03-25 ENCOUNTER — Telehealth: Payer: Self-pay | Admitting: Internal Medicine

## 2011-03-25 NOTE — Telephone Encounter (Signed)
Will be able to go home after lunch

## 2011-03-25 NOTE — Telephone Encounter (Signed)
Pt has question re her procedure next friday

## 2011-03-27 ENCOUNTER — Encounter: Payer: Self-pay | Admitting: Internal Medicine

## 2011-03-27 DIAGNOSIS — J209 Acute bronchitis, unspecified: Secondary | ICD-10-CM | POA: Insufficient documentation

## 2011-03-27 NOTE — Progress Notes (Signed)
[name] is a pleasant 70 y.o. yo patient with a h/o complete heart blocksp PPM (MDT) previously followed by Dr Juanda Chance, who presents today for further care in the Electrophysiology device clinic.  She initially had a pacemaker implanted 1993 for mobitz II second degree AV block.  This has progressed to complete heart block.  Her pacemaker generator change was most recently performed 2003 with a MDT Kappa DR pacemaker. The patient reports doing very well since having a pacemaker implanted and remains very active despite her age.  She underwent CABG 2010 which was complicated by a sternal wound infection requiring debridement and flap placement.  She has done well since that time.  Today, she  denies symptoms of palpitations, chest pain,orthopnea, PND, lower extremity edema, dizziness, presyncope, syncope, or neurologic sequela.  She does however report nasal congestion with cough p/o thick yellow sputum with associated SOB x 1 week.  She denies fevers or chills.  The patientis tolerating medications without difficulties and is otherwise without complaint today.   Past Medical History  Diagnosis Date  . Diabetes mellitus   . HTN (hypertension)   . Seizure disorder   . CAD (coronary artery disease)     multivessel, CABG 6/10.  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  Myoview (2/11) showed EF 62%, fixed septal defect c/w  breast attenuation, minimal inferolateral reversible defect (attenuation versus mild ischemia).  Overall low risk  study  . GERD (gastroesophageal reflux disease)   . Hyperlipidemia   . DVT (deep venous thrombosis)     R arm venous occlusion s/p prior PPM  . Obesity   . DDD (degenerative disc disease)   . Complete heart block 1993    s/p PPM, most recent generator change 2003  . Dehiscence of closure of sternum or sternotomy 8/10    required flap    Past Surgical History  Procedure Date  . Pacemaker placement 1993    Status post DDD pacemaker implantation in 1993, with Medtronic Kappa  generator change in 2003 for  second-degree AV block  . Revision total hip arthroplasty   . Replacement total knee   . Thyroid surgery   . Wrist surgery   . Coronary artery bypass graft     - LIMA to LAD, SVG to OM, SVG to PDA  . Reconstructive repair sternal     for infection s/p CABG 8/10    History   Social History  . Marital Status: Married    Spouse Name: N/A    Number of Children: N/A  . Years of Education: N/A   Occupational History  . Not on file.   Social History Main Topics  . Smoking status: Never Smoker   . Smokeless tobacco: Not on file  . Alcohol Use: No  . Drug Use: No  . Sexually Active: Not on file   Other Topics Concern  . Not on file   Social History Narrative  . No narrative on file    Family History  Problem Relation Age of Onset  . Diabetes    . Heart disease      Allergies  Allergen Reactions  . Codeine   . Oxycodone Hcl     Current Outpatient Prescriptions  Medication Sig Dispense Refill  . alprazolam (XANAX) 2 MG tablet Take 2 mg by mouth at bedtime as needed.        Marland Kitchen amLODipine-benazepril (LOTREL) 10-20 MG per capsule Take 1 capsule by mouth daily.        Marland Kitchen aspirin 325  MG tablet Take 325 mg by mouth daily.        Marland Kitchen esomeprazole (NEXIUM) 40 MG capsule Take 40 mg by mouth daily before breakfast.        . estrogens conjugated, synthetic A, (CENESTIN) 0.625 MG tablet Take 0.625 mg by mouth daily.        . furosemide (LASIX) 20 MG tablet 1 tab po bid      . gabapentin (NEURONTIN) 100 MG tablet Take 100 mg by mouth 3 (three) times daily.        Marland Kitchen HYDROcodone-acetaminophen (VICODIN) 5-500 MG per tablet Take 1 tablet by mouth every 6 (six) hours as needed.        . metoprolol succinate (TOPROL-XL) 25 MG 24 hr tablet Take 25 mg by mouth daily.        . nabumetone (RELAFEN) 500 MG tablet Take 500 mg by mouth daily.        Marland Kitchen olopatadine (PATANOL) 0.1 % ophthalmic solution 1 drop 2 (two) times daily.        . pantoprazole (PROTONIX) 40 MG  tablet Take 40 mg by mouth daily.        . phenytoin (DILANTIN) 100 MG ER capsule 3 tabs po qd       . potassium chloride (KLOR-CON) 10 MEQ CR tablet Take 10 mEq by mouth daily.        . simvastatin (ZOCOR) 40 MG tablet Take 40 mg by mouth at bedtime.        Marland Kitchen azithromycin (ZITHROMAX Z-PAK) 250 MG tablet Take 2 tablets by mouth on day 1, followed by 1 tablet by mouth daily for 4 days. Take 2 tablets (500 mg) on  Day 1,  followed by 1 tablet (250 mg) once daily on Days 2 through 5.  6 each  0    ROS- all systems are reviewed and negative except as per HPI  Physical Exam: Filed Vitals:   03/24/11 0931  BP: 107/66  Pulse: 66  Resp: 18  Height: 5\' 2"  (1.575 m)  Weight: 238 lb (107.956 kg)    GEN- The patient is well appearing but elderly, alert and oriented x 3 today.   Head- normocephalic, atraumatic Eyes-  Sclera clear, conjunctiva pink Nares congested with green sputum Ears- hearing intact Oropharynx- clear Neck- supple, no JVP Lymph- no cervical lymphadenopathy Lungs- diffuse rhonchii with frequent cough and expiratory wheezes, normal work of breathing Chest- R sided pacemaker pocket is well healed Heart- Regular rate and rhythm, no murmurs, rubs or gallops, PMI not laterally displaced GI- soft, NT, ND, + BS Extremities- no clubbing, cyanosis, or edema MS- no significant deformity or atrophy Skin- no rash or lesion Psych- euthymic mood, full affect Neuro- strength and sensation are intact  Pacemaker interrogation- reviewed in detail today and reveals ERI battery status,  See PACEART report  Assessment and Plan:

## 2011-03-27 NOTE — Assessment & Plan Note (Signed)
Her pacemaker has reached ERI.  She will require generator change in the near future. R/B/A to pacemaker generator change were discussed at length w/ patent who wishes to proceed. We will plan PPM generator change once her bronchitis is resolved.

## 2011-03-27 NOTE — Assessment & Plan Note (Signed)
Will treat with a 5 day Z pak for bronchitis.  She will follow-up with her PCP if not improved.

## 2011-03-27 NOTE — Assessment & Plan Note (Signed)
Stable No changes 

## 2011-03-27 NOTE — Assessment & Plan Note (Signed)
No ischemic symptoms No changes today 

## 2011-04-05 ENCOUNTER — Telehealth: Payer: Self-pay | Admitting: Internal Medicine

## 2011-04-05 NOTE — Telephone Encounter (Signed)
Pt called and stated she needs to be rescheduled for surgery. PM battery change.  Over illness and ready to have it done.

## 2011-04-05 NOTE — Telephone Encounter (Signed)
Changed to 04/14/11 5:30/7:30am  Pt aware and will get labs redrawn on 04/14/11

## 2011-04-07 ENCOUNTER — Other Ambulatory Visit (INDEPENDENT_AMBULATORY_CARE_PROVIDER_SITE_OTHER): Payer: PRIVATE HEALTH INSURANCE | Admitting: *Deleted

## 2011-04-07 DIAGNOSIS — I442 Atrioventricular block, complete: Secondary | ICD-10-CM

## 2011-04-07 LAB — BASIC METABOLIC PANEL
CO2: 28 mEq/L (ref 19–32)
Calcium: 8.9 mg/dL (ref 8.4–10.5)
Chloride: 99 mEq/L (ref 96–112)
Creatinine, Ser: 0.9 mg/dL (ref 0.4–1.2)
Glucose, Bld: 88 mg/dL (ref 70–99)

## 2011-04-07 LAB — CBC WITH DIFFERENTIAL/PLATELET
Basophils Relative: 0.5 % (ref 0.0–3.0)
Eosinophils Relative: 1.5 % (ref 0.0–5.0)
HCT: 32 % — ABNORMAL LOW (ref 36.0–46.0)
Hemoglobin: 10.8 g/dL — ABNORMAL LOW (ref 12.0–15.0)
Lymphocytes Relative: 20.2 % (ref 12.0–46.0)
Lymphs Abs: 1.9 10*3/uL (ref 0.7–4.0)
Monocytes Relative: 6.5 % (ref 3.0–12.0)
Neutro Abs: 6.6 10*3/uL (ref 1.4–7.7)
RBC: 3.63 Mil/uL — ABNORMAL LOW (ref 3.87–5.11)
WBC: 9.2 10*3/uL (ref 4.5–10.5)

## 2011-04-14 ENCOUNTER — Ambulatory Visit (HOSPITAL_COMMUNITY)
Admission: RE | Admit: 2011-04-14 | Discharge: 2011-04-14 | Disposition: A | Discharge status: Home / Self Care | Payer: PRIVATE HEALTH INSURANCE | Source: Ambulatory Visit | Attending: Internal Medicine | Admitting: Internal Medicine

## 2011-04-14 DIAGNOSIS — E119 Type 2 diabetes mellitus without complications: Secondary | ICD-10-CM | POA: Insufficient documentation

## 2011-04-14 DIAGNOSIS — I441 Atrioventricular block, second degree: Secondary | ICD-10-CM

## 2011-04-14 DIAGNOSIS — I495 Sick sinus syndrome: Secondary | ICD-10-CM | POA: Insufficient documentation

## 2011-04-14 DIAGNOSIS — Z45018 Encounter for adjustment and management of other part of cardiac pacemaker: Secondary | ICD-10-CM | POA: Insufficient documentation

## 2011-04-14 LAB — SURGICAL PCR SCREEN
MRSA, PCR: NEGATIVE
Staphylococcus aureus: NEGATIVE

## 2011-04-14 LAB — GLUCOSE, CAPILLARY: Glucose-Capillary: 107 mg/dL — ABNORMAL HIGH (ref 70–99)

## 2011-04-14 LAB — PROTIME-INR: Prothrombin Time: 14.1 seconds (ref 11.6–15.2)

## 2011-04-19 NOTE — Consult Note (Signed)
NAMEJACARIA, COLBURN                ACCOUNT NO.:  [id number]   MEDICAL RECORD NO.:  [id number]          PATIENT TYPE:  OBV   LOCATION:  A212                          FACILITY:  [facility]   PHYSICIAN:  Kassie Mends, M.D.      DATE OF BIRTH:  1941-06-13   DATE OF CONSULTATION:  12/18/2007  DATE OF DISCHARGE:                                 CONSULTATION   REFERRING PHYSICIAN:  Oneal Deputy. Juanetta Gosling, M.D.   REASON FOR CONSULTATION:  Vomiting.   HISTORY OF PRESENT ILLNESS:  Ms. Harmon is a 70 year old female who has a  history of heme-positive stool.  She had an EGD in 2007 which showed a  patent Schatzki's ring.  She was in her usual state of health until  Thursday when she ate a piece of sausage that felt like it got stuck.  She said she felt like she had a knot in her throat.  On Friday  evening, she began to vomit.  She vomited all Friday night and on  Saturday went to the emergency department.  She was discharged home  after being given Zofran.  She was discharged with Phenergan.  On Sunday  morning, she woke up and she began to vomit again.  She states she was  sicker on Sunday than on Saturday.  On Monday, she came to the emergency  department and was admitted.  She feels better since admission.  She is  having mild nausea.  She has started having heartburn since yesterday.  She describes the discomfort in her chest as sharp.  It is off and on.  It does not radiate to her left arm or her left jaw.  She is able to  keep down liquids.  On Saturday and Sunday, she was not able to keep  down liquids.  She developed watery stool after her Gastrografin swallow  today.  She denies any abdominal pain, shortness of breath, blood in her  stool, blood in her vomit, or black tarry stools.  She does not wake up  in the middle of the night short of breath, and she is able to climb  stairs without shortness of breath or chest pain.  She has not been on  anything for reflux except for Zegerid.  She usually  does not have  heartburn.  She does not take any aspirin or NSAIDs.   PAST MEDICAL HISTORY:  1. Hypertension.  2. Hyperlipidemia.  3. Gastroparesis.  4. Irregular heart beat requiring pacemaker.  5. Seizure disorder.   PAST SURGICAL HISTORY:  1. Hysterectomy.  2. Hip replacement.  3. Knee replacement.  4. Thyroid surgery.   ALLERGIES:  CODEINE.   MEDICATIONS:  1. Norvasc.  2. Lotensin.  3. Lovenox 40 mg subcu q.h.s. (now on hold).  4. Reglan 10 mg q.a.c. and q.h.s.  5. Protonix 40 mg daily.  6. Dilantin.  7. Potassium 20 mEq 4 times a day.  8. Zocor.   FAMILY HISTORY:  She has no family history of colon cancer or colon  polyps.   SOCIAL HISTORY:  She is married.  She does not  smoke and does not drink  any alcohol.   REVIEW OF SYSTEMS:  She had a colonoscopy in 2006 which showed no  polyps.  Her review of systems is per the HPI.  Otherwise, all systems  are negative.   PHYSICAL EXAMINATION:  VITAL SIGNS:  Temperature 98.9, blood pressure  132/70, pulse 76, respiratory rate 20.  GENERAL:  She is in no apparent distress, alert and oriented x4.  HEENT:  Atraumatic, normocephalic.  Pupils are equal, round, and  reactive to light.  The mouth has no oral lesions.  NECK:  Full range of motion.  No lymphadenopathy.  LUNGS:  Clear to auscultation bilaterally.  CARDIOVASCULAR:  Regular rhythm.  No murmur.  Normal S1 and S2.  ABDOMEN:  Bowel sounds are present.  Soft, nontender, nondistended.  No  rebound or guarding.  Slightly obese.  EXTREMITIES:  No clubbing, cyanosis, or edema.  Her right knee has an  incision which is well-healed.  NEUROLOGIC:  She has no focal neurologic deficits.   LABORATORY DATA:  White count 14.6, hemoglobin 13.7, platelets 227.  Potassium 2.8, BUN 15, creatinine 1.20.  Troponin I 0.8.   RADIOGRAPHIC STUDIES:  Gastrografin swallowing study:  No esophageal  stricture or mass, intraluminal defects, or contrast extravasation.   ASSESSMENT:  Ms.  Matsuo is a 70 year old female with a sudden onset of  vomiting after choking on a sausage.  She never vomited up the sausage.  Her last esophagogastroduodenoscopy was in November of 2007 that showed  a patent Schatzki's ring.  She does have poor dentition.  A differential  diagnosis for her vomiting includes viral illness, gastroparesis  exacerbation, and the low likelihood of uncontrolled gastroesophageal  reflux disease or stricture.  Thank you for allowing me to see Ms. Cayton  in consultation.  My recommendations follow.   RECOMMENDATIONS:  1. Continue Reglan and Protonix.  2. Could consider barium swallow if vomiting and dysphagia persists or      go right to EGD.  Could also obtain esophageal biopsies to evaluate      for Eosinophilic esophagitis as an etiology for her dysphagia, but      it is a low      likelihood at her age.  3. May advance diet.   ADDENDUM:  OPV in 2 mos with [facility] for dysphagia.      Kassie Mends, M.D.  Electronically Signed     SM/MEDQ  D:  12/18/2007  T:  12/18/2007  Job:  [id number]   cc:   Ramon Dredge L. Juanetta Gosling, M.D.  Fax: [phone]

## 2011-04-19 NOTE — Group Therapy Note (Signed)
NAMELAJOY, VANAMBURG                ACCOUNT NO.:  [id number]   MEDICAL RECORD NO.:  [id number]          PATIENT TYPE:  OBV   LOCATION:  A212                          FACILITY:  [facility]   PHYSICIAN:  Edward L. Juanetta Gosling, M.D.DATE OF BIRTH:  Sep 09, 1941   DATE OF PROCEDURE:  DATE OF DISCHARGE:                                 PROGRESS NOTE   PROBLEM LIST:  Chest pain probably related to gastroesophageal reflux  disease, Ms. Schlemmer says she feels great today.  She has no complaints.  She says she wants to go home.  Since she is asymptomatic at this point  and since she did have a study to rule out esophageal rupture I think  she is okay to go.   PHYSICAL EXAMINATION:  VITALS:  Her exam today shows temperature is  98.5, pulse 75, respirations 18, blood pressure 156/94, O2 sats 95% on  room air.  CHEST:  Clear.  HEART:  Regular.  ABDOMEN:  Very soft.   ASSESSMENT:  She is doing well.   PLAN:  Discharge home.  Please discharge summary for details.      Edward L. Juanetta Gosling, M.D.  Electronically Signed     ELH/MEDQ  D:  12/19/2007  T:  12/19/2007  Job:  [id number]

## 2011-04-19 NOTE — Op Note (Signed)
[name], VANPELT                ACCOUNT NO.:  [id number]   MEDICAL RECORD NO.:  [id number]          PATIENT TYPE:  INP   LOCATION:  2312                         FACILITY:  [facility]   PHYSICIAN:  Salvatore Decent. Cornelius Moras, M.D. DATE OF BIRTH:  12-11-1940   DATE OF PROCEDURE:  06/26/2009  DATE OF DISCHARGE:                               OPERATIVE REPORT   PREOPERATIVE DIAGNOSIS:  Sternal wound infection.   POSTOPERATIVE DIAGNOSIS:  Sternal wound infection.   PROCEDURE:  Sternal wound debridement.   SURGEON:  Salvatore Decent. Cornelius Moras, MD   ANESTHESIA:  General.   Ms. Franzen was brought to the operating room on the afternoon of June 26, 2009, and placed in supine position on the operating table.  General  endotracheal anesthesia was induced uneventfully under the care and  direction of Dr. Adonis Huguenin.  A Foley catheter was placed.  The  patient's existing wound dressing was removed and the patient's anterior  chest was prepared and draped in sterile manner.  The wound was  explored.  The wound was clean.  There was nonhealing of the sternum.  The remaining skin incision was extended in a cephalad direction to  facilitate removal of the remaining sternal wires.  All the remaining  sternal wires were removed.  There was evidence of some bone formation  superiorly, but the sternum has not healed.  The wound was irrigated  with copious saline solution using pulse lavage irrigation device.  There was no sign of any undrained infection and there was no sign of  deep infection at all in the mediastinum.  The anterior table of the  left hemisternum was partially debrided to facilitate wound packing.  The wound was subsequently closed by placement of a wound vacuum-  assisted closure device.  The patient tolerated the procedure well, was  extubated in the operating room and transported to recovery room in  stable condition.  There were no intraoperative complications.      Salvatore Decent. Cornelius Moras, M.D.  Electronically Signed     CHO/MEDQ  D:  06/26/2009  T:  06/27/2009  Job:  [id number]

## 2011-04-19 NOTE — Op Note (Signed)
NAMETENEE, [name]                ACCOUNT NO.:  [id number]   MEDICAL RECORD NO.:  [id number]          PATIENT TYPE:  INP   LOCATION:  2312                         FACILITY:  [facility]   PHYSICIAN:  Christine Ave, MD DATE OF BIRTH:  03-May-1941   DATE OF PROCEDURE:  06/29/2009  DATE OF DISCHARGE:                               OPERATIVE REPORT   SURGEON:  Christine Ave, MD   PREOPERATIVE DIAGNOSIS:  Sternal wound.   POSTOPERATIVE DIAGNOSIS:  Sternal wound.   PROCEDURE PERFORMED:  1. Debridement of skin, subcutaneous tissue, and bone at the sternum.  2. Vertical rectus abdominis myocutaneous flap, right.   ESTIMATED BLOOD LOSS:  150 mL.   IV FLUIDS:  1 L of lactated Ringer's, 500 mL of albumin.   URINE OUTPUT:  250 mL.   DRAINS:  Jackson-Pratt x3, 2 in the chest and 1 in the abdomen.   COMPLICATIONS:  None.   SURGICAL TIME:  8:10 a.m. to 11:16 a.m.   CLINICAL INDICATIONS:  [name] is a 70 year old female with  coronary artery disease.  Approximately 1 month ago, she underwent  coronary artery bypass graft with use of the left internal mammary  artery.  She initially recovered uneventfully and was discharged home,  but developed a postoperative sternal wound infection.  She has been  taken back to the operating room multiple times by Dr. Cornelius Wolf for repeat,  serial debridements.  She is on IV Avelox.  She presents at this time  with a sternal wound from sternal notch to xiphoid process.  Her wires  have been removed.  The majority of her postoperative wound infection is  located at the inferior third of her sternum.  She has been with a VAC  dressing on her sternum for the last 3 days since her last debridement.  She presents at this time for definitive coverage of her wound.   After discussion of the risks of surgery, which include, but are not  limited to death, bleeding, infection, damage to the nearby structures,  ongoing infection, failure to heal her wound,  wound breakdown either of  her sternum or her abdomen, and the need for more surgery, [name]  understands these risks and desires to proceed.   DESCRIPTION OF OPERATION:  The patient was brought to the operating room  and placed in the supine position on the operating room table.  After  smooth and routine induction of general anesthesia, the patient's chest  and abdomen were scrubbed with Betadine scrub and painted with Betadine  paint.  These were draped into a single operative sterile field.  A 40  mL of 0.25% Marcaine with 1:200,000 epinephrine were injected about the  sternal wound and about the proposed paramedian vertical incision on the  right abdomen.  After waiting for the hemostatic effects of epinephrine  to take place, the sternal wound was excised.  The skin was incised with  a 10 blade, and the walls of the wound were excised with electrocautery.  Next, a rongeur and curettes were used to remove soft pieces of sternum  in the wound.  These  were mostly in the inferior third of her wound.  Pieces of a sternum were sent for bone culture as well.  Curettes were  also used to gently abrade the wound, taking care not to damage vital  structures.  Next, the wound was irrigated with 3 L of normal saline via  pulse lavage.  Hemostasis was verified with electrocautery.  Next,  attention was turned to the right abdomen.   A vertical paramedian incision was made with a 10 blade and dissection  proceeded to the fascia overlying the rectus muscle with electrocautery.  The fascia over the rectus muscle was incised with a 15 blade and  elevated with bipolar electrocautery.  This proceeded medially over the  midportion of the muscle and laterally over the midportion of the  muscle, then inferiorly.  Distally, the inferior epigastric pedicle was  identified.  There was 1 artery in 2 venae comitantes.  They were  encircled individually with blunt dissection, doubly clipped, and  divided  with tenotomy scissors.  Next, the electric cautery was turned  up to 60 on coagulation and the muscle was transected near its insertion  to the pubis.  Next, the muscle was gently elevated moving superiorly,  freeing it from its medial and deep attachments with bipolar  electrocautery.  This proceeded to the level of the sternal and costal  cartilages.  Next, the fascia overlying the rectus muscle at the  superior aspect of the rectus muscle was cut laterally to allow the  rectus muscle to rotate up.  Next, the abdominal wound was irrigated  with normal saline and hemostasis verified with electrocautery.  The  subcutaneous tunnel was made with electrocautery to link the abdominal  wound to the sternal wound.  Care was taken to make it 50% larger than  the rectus muscle at that level.  Next, the rectus muscle was rotated  superiorly and covered the inferior half of the sternal wound.  It was  pink,  viable, and bleeding from cut edges.  It was sutured in place  with interrupted 3-0 Vicryl sutures in its periphery to maintain  coverage of the inferior half of the sternum.  Next, Jackson-Pratt  drains were placed via separate stab incisions.  Two of the previous  mediastinal drain scars were utilized to place 2 Jackson-Pratts in the  sternal wound, 1 above the rectus muscle and 1 underneath the rectus  muscle.  They were secured to the skin with silk sutures.  A 19-French  round Blake drain was placed in the abdominal wound via separate stab  incision inferiorly.  This was also secured to the skin with a silk  suture.  Drains were trimmed to fit the wounds.  Next, the abdominal  wound was closed in layers.  1-0 Prolene pop-offs were placed in buried  figure-of-eight fashion in the rectus fascia.  Next, the deep fat was  closed with a running 3-0 Vicryl suture, and the skin was closed with a  combination of 2-0 vertical mattress nylon sutures and staples.  Next,  the skin flaps on the sternal  wound were elevated with electrocautery  for approximately 12 cm laterally in each direction.  This was done with  electrocautery.  This wound was again irrigated with normal saline and  hemostasis verified with electrocautery.  This subcutaneous release  allowed easy transposition of skin flaps to the midline without tension.  The deep layer of the sternal wound was closed with interrupted 3-0  Vicryl pop-off sutures.  The skin was closed with a combination of 2-0  vertical mattress nylon sutures and staples.  Drains were charged.  Xeroform, 4x4s, ABD pads, and an abdominal binder was placed on each  incision.  The patient had an abdominal binder across the chest and 1  across the abdomen.  Sponge and needle counts were reported as correct  x2.  The patient was extubated and transported to recovery room in  stable condition.      Christine Ave, MD  Electronically Signed     CF/MEDQ  D:  06/29/2009  T:  06/30/2009  Job:  [id number]

## 2011-04-19 NOTE — Cardiovascular Report (Signed)
[name], [name]                ACCOUNT NO.:  [id number]   MEDICAL RECORD NO.:  [id number]          PATIENT TYPE:  OIB   LOCATION:  1961                         FACILITY:  [facility]   PHYSICIAN:  Bruce R. Juanda Chance, MD, FACCDATE OF BIRTH:  1941-02-12   DATE OF PROCEDURE:  05/15/2009  DATE OF DISCHARGE:  05/15/2009                            CARDIAC CATHETERIZATION   CLINICAL HISTORY:  [name] is 70 years old and has no prior history  of known coronary disease.  I saw 2 days ago for a pacemaker followup.  She told me she had two episodes of severe chest pain about 2 weeks  prior to that.  Her EKG showed new inferior T-wave changes and we  brought in for catheterization today.  She has had no recurrent chest  pain since then.   PROCEDURE:  The procedure was performed via the femoral artery using the  arterial sheath and 4-French preformed coronary catheters.  A front-wall  arterial puncture was performed and Omnipaque contrast was used.  A  separate injection was performed to assess the LIMA for suitability for  bypass grafting.  Distal aortogram was performed to evaluate her  peripheral vascular disease.  She tolerated the procedure well and left  the laboratory in satisfactory condition.   RESULTS:  Left main coronary artery:  The left main coronary artery was  free of significant disease.   Left anterior descending artery:  The left anterior descending artery  gave rise to two diagonal branches and a septal perforator.  There was a  80-90% stenosis in the mid LAD.  The first diagonal branch was a small  vessel and had a 90% stenosis and the second diagonal branch had an 80%  stenosis.   Circumflex artery:  The circumflex artery was completely occluded in its  proximal to midportion.  The distal circumflex artery filled via  collaterals from the LAD and consisted of a marginal and two  posterolateral branches.  These were fairly well-developed collaterals.   Right coronary  artery:  The right coronary artery was a moderate-sized  vessel that gave rise to a conus branch, a right ventricular branch, and  then had a 99% stenosis with TIMI I flow distally.  There was also a 90%  stenosis before the 99% stenosis.  The distal vessel consists of a  posterior descending and a posterolateral branch.  These also filled  slightly from collaterals from the left coronary artery.   The right coronary also fed collaterals to the circumflex artery and  supplied two posterolateral branches.   The subclavian injection for the total injection showed a patent  vertebral and internal mammary artery.   Left ventriculogram:  The left ventriculogram performed in the RAO  projection showed good wall motion with slight inferior wall  hypokinesis.  The estimated fraction was 55-60%.   Distal aortogram:  The distal aortogram was performed which showed  patent renal arteries and no significant aortic obstruction.   HEMODYNAMIC DATA:  The aortic pressure was 143/70 with a mean of 101.  Left ventricular pressure was 143/23.   CONCLUSION:  Severe  three-vessel coronary artery disease with 80-90%  stenosis in the mid left anterior descending artery, total occlusion of  the proximal circumflex artery, and 99% stenosis in the mid-right  coronary with TIMI I flow and a slight inferior wall hypokinesis with an  estimated ejection fraction of 55%.   RECOMMENDATIONS:  The patient has severe three-vessel disease.  The  circumflex artery is a chronic total lesion.  I think with the extent of  her disease, she is best treated with bypass surgery.  We made an  appointment to see Dr. Cornelius Moras next week with plans for surgery shortly  after that.      Bruce Elvera Lennox Juanda Chance, MD, [facility]  Electronically Signed     BRB/MEDQ  D:  05/15/2009  T:  05/16/2009  Job:  [id number]   cc:   Ramon Dredge L. Juanetta Gosling, M.D.

## 2011-04-19 NOTE — Discharge Summary (Signed)
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   PROCEDURE:  Transforaminal lumbar interbody fusion L4-5.   BRIEF HISTORY OF PRESENT ILLNESS:  Ms. Colborn is a very pleasant 70-year-  old female who had undergone a previous decompressive laminectomy in the  remote past for spinal stenosis.  She presented with severe left leg  pain was found to have a grade 1 spondylolisthesis at L4-5 with severe  spinal stenosis at that level.  I recommended a repeat decompression and  instrumented fusion.  She understood the risks, benefits, and expected  outcome and wished to proceed.   HOSPITAL COURSE:  The patient was admitted on October 16, 2008, and  taken to the operating room where she underwent a transforaminal lumbar  interbody fusion at L4-5.  The patient tolerated the procedure well was  taken to the recovery room and then to the floor in stable condition.  For details of the operative procedure, please see the dictated  operative note.  The patient's hospital course was routine.  There were  no complications.  She remained afebrile with stable vital signs.  She  tolerated a regular diet.  She had a Dilaudid PCA for the first evening  but that was removed on postop day #2 and her pain remained fairly well-  controlled on oral pain medications.  Her wound remained clean, dry and  intact.  She had good strength in  lower extremities.  She has worked  with physical and occupational therapy and made significant strides with  them.  We were pleased with her progress.  She was discharged  home on  October 19, 2008, with plans to follow up with me in 2 weeks.   FINAL DIAGNOSIS:  Transforaminal lumbar interbody fusion L4-5.      Tia Alert, MD  Electronically Signed     DSJ/MEDQ  D:  10/19/2008  T:  10/19/2008  Job:  [id number]

## 2011-04-19 NOTE — Assessment & Plan Note (Signed)
OFFICE VISIT   [name], [name]  DOB:  18-Nov-1941                                        October 26, 2009  CHART #:  [id number]   HISTORY:  The patient presents to the office today for followup status  post coronary artery bypass grafting x3 on May 28, 2009.  The patient's  initial postoperative recovery was complicated by sternal wound  infection that ultimately required sternal wound debridement and flap  closure by Dr. Loreta Ave.  She was last seen here in the office  on July 20, 2009.  Since then, the patient has remained essentially  stable.  She continues to have some intermittent pains across her chest  that sounded to be musculoskeletal in nature and are common in patients  with history of sternal wound infections and need for wound debridement  and flap closure.  The patient also has stable chronic exertional  shortness of breath.  The patient denies resting shortness of breath,  PND, palpitations, syncope.  She has been seen recently in the office by  Dr. Juanda Chance and Dr. Juanetta Gosling who continued to tend to her long-term  medical needs.  She has also been seen recently by Dr. Noelle Penner.  The  remainder of her review of systems is unremarkable.  The remainder of  her past medical history is unchanged.   CURRENT MEDICATIONS:  Patanol ophthalmic solution, nabumetone tablet,  Neurontin, Dilantin, Darvocet, Protonix, Reglan, Nexium, simvastatin,  alprazolam, zolpidem, amlodipine/benazepril, estrogen, and hydrocodone  as needed.   PHYSICAL EXAMINATION:  Notable for well-appearing obese female with  blood pressure 158/90, pulse 86, oxygen saturation 98% on room air.  Examination of the chest reveals that all of her surgical incisions  appear to have healed appropriately.  Auscultation reveals clear breath  sounds which are symmetrical bilaterally.  No wheezes or rhonchi are  noted.  Cardiovascular exam includes regular rate and rhythm.  No  murmurs, rubs, or gallops appreciated.  Extremities are warm and  adequately perfused.  There is moderate bilateral lower extremity edema.   IMPRESSION:  The patient remains clinically stable and appears to have  recovered from her variety of surgical procedures.  She has stable  exertional shortness of breath.  She still has intermittent pains across  her chest that sound to be related to her unstable sternum and muscle  flap reconstruction.   PLAN:  In the future, the patient will call and return to see [place] as  needed.  I have cautioned her that she may have intermittent discomfort  and pains across her chest related to her previous surgeries and flap  closure, and that some of these symptoms may remain indefinitely.  All  of her questions have been addressed.   Salvatore Decent. Cornelius Moras, M.D.  Electronically Signed   CHO/MEDQ  D:  10/26/2009  T:  10/26/2009  Job:  [id number]   cc:   Everardo Beals. Juanda Chance, MD, [facility]  Oneal Deputy. Juanetta Gosling, M.D.  Loreta Ave, MD

## 2011-04-19 NOTE — H&P (Signed)
NAMEWHITNEY, HILLEGASS                ACCOUNT NO.:  [id number]   MEDICAL RECORD NO.:  [id number]          PATIENT TYPE:  INP   LOCATION:  2316                         FACILITY:  [facility]   PHYSICIAN:  Kerin Perna, M.D.  DATE OF BIRTH:  [birth year]   DATE OF ADMISSION:  06/06/2009  DATE OF DISCHARGE:                              HISTORY & PHYSICAL   ADMISSION DIAGNOSIS:  Cellulitis and superficial sternal wound infection  status post coronary artery bypass graft.   CHIEF COMPLAINT:  Incisional drainage, pain and fever.   HISTORY OF PRESENT ILLNESS:  Ms. Slomski is a 70 year old obese  nondiabetic female who underwent three-vessel coronary bypass grafting  by Dr. Tressie Stalker on May 25, 2009, for class IV progressive angina  and three-vessel coronary artery disease.  At the time of operation, the  left IMA was grafted to the LAD and a vein graft was placed to the  circumflex marginal and posterior descending branch of the right  coronary.  She had an uneventful recovery and was discharged home on  May 29, 2009, in sinus rhythm with her incisions clean and dry.  She  did well initially but then within the past 24 hours of admission,  developed swelling, tenderness, and drainage associated with fever.  The  drainage has been from the lower aspect of the sternal incision.  She  denies symptoms of CHF or angina.  She presents to the emergency  department for an evaluation of the sternal incision.   PAST MEDICAL HISTORY:  1. Hypertension.  2. Gastroparesis.  3. GERD with hiatal hernia.  4. Diverticulosis.  5. History of prior myocardial infarction.  6. Pacemaker placement for bradycardia.  7. Seizure disorder.  8. Status post thyroid surgery and back surgery.   HOME MEDICATIONS:  1. Patanol 0.1% eyedrops nightly.  2. Relafen 500 mg daily.  3. Gabapentin 100 mg t.i.d.  4. Dilantin 200 mg nightly.  5. Darvocet-N p.r.n. pain.  6. Protonix 40 mg daily.  7. Reglan 5 mg a.c. and  nightly.  8. Nexium 40 mg daily.  9. Zocor 40 mg daily.  10.Xanax 2 mg t.i.d.  11.Ambien 10 mg nightly.  12.Lasix 40 mg daily.  13.Lopressor 25 p.o. b.i.d.  14.Benazepril 20 mg daily.  15.Ortho-Est 0.625 mg daily.   ALLERGIES:  CODEINE causes nausea.   SOCIAL HISTORY:  The patient is married, lives with her husband and  daughter in [place].  She has retired and was previously a Probation officer.  She denies alcohol or tobacco use.   FAMILY HISTORY:  Negative and noncontributory.   REVIEW OF SYSTEMS:  She has done fairly well since her return from the  hospital except for the incisional problem.  She denies nausea,  vomiting, but she has had constipation.  She feels she had a low-grade  fever at home, and her temperature in the emergency department today is  101.5.  She is a nondiabetic.  She denies any fall since her return  home.   PHYSICAL EXAMINATION:  GENERAL:  She is an obese African American female  in the emergency department  accompanied by her daughter, who is  breathing comfortably in no acute distress. General appearance is that  of an obese female, in no acute distress.  VITAL SIGNS:  Blood pressure 110/70; heart rate 80, sinus; saturation on  2 liters 96%; temperature 101.4.  HEENT:  Normocephalic.  CHEST:  Breath sounds are clear and equal.  NECK:  There is no palpable adenopathy in the neck or JVD.  CHEST WALL:  Sternum is stable; however, there is erythema, some  superficial necrosis, and purulent drainage of the lower aspect of the  incision at the breast crease.  This very well could be fat necrosis.  The sternum has no instability.  CARDIAC:  Rhythm is regular.  No S3, gallop, or murmur.  ABDOMEN:  Soft and benign.  EXTREMITIES:  The leg incision below the right knee is healing nicely.  There is minimal distal edema in the right lower extremity.  NEUROLOGIC:  Intact and peripheral pulses are intact.   LABORATORY DATA:  Her chest x-ray is clear and  the sternal wires are  intact and well aligned.  Her white count is 15,000 and hematocrit is  28.  Urine urinalysis is negative, and her creatinine is 0.7 with a  glucose of 130.  Sodium 141.   IMPRESSION AND PLAN:  The patient has a superficial sternal wound  infection with fever and elevated white count, needs to be admitted for  IV antibiotics.  We will place her on IV vancomycin and Avelox and  perform dressing changes b.i.d.  The plan for admission and plan of care  was discussed with the patient and her daughter, and they understand and  agree.  Cultures of the wound were taken in the emergency department and  submitted for analysis.      Kerin Perna, M.D.  Electronically Signed     PV/MEDQ  D:  06/07/2009  T:  06/07/2009  Job:  [id number]   cc:   Ramon Dredge L. Juanetta Gosling, M.D.

## 2011-04-19 NOTE — Op Note (Signed)
NAMESOO, STEELMAN                ACCOUNT NO.:  [id number]   MEDICAL RECORD NO.:  [id number]          PATIENT TYPE:  INP   LOCATION:  2010                         FACILITY:  [facility]   PHYSICIAN:  Salvatore Decent. Cornelius Moras, M.D. DATE OF BIRTH:  10/25/1941   DATE OF PROCEDURE:  06/15/2009  DATE OF DISCHARGE:                               OPERATIVE REPORT   PREOPERATIVE DIAGNOSIS:  Sternal wound infection.   POSTOPERATIVE DIAGNOSIS:  Sternal wound infection.   PROCEDURE:  Sternal wound debridement with placement of vacuum-assisted  closure device.   SURGEON:  Salvatore Decent. Cornelius Moras, MD   ANESTHESIA:  Monitored anesthesia care with light intravenous sedation.   BRIEF CLINICAL NOTE:  The patient is a 70 year old morbidly obese female  who underwent coronary artery bypass grafting on May 25, 2009.  The  patient's initial postoperative recovery was uneventful and she was  discharged home on the fourth postoperative day.  She was readmitted to  the hospital on June 06, 2009 with swelling, tenderness, and drainage and  low-grade fever involving the inferior aspect of her sternal incision.  The patient was treated with IV antibiotics with wound debridement.  The  patient defervesced and improved clinically.  However, the wound  continues to drain serosanguineous fluid.  On physical exam, there is  suggestion of inadequate drainage.  The patient is now brought to the  operating room for wound exploration and drainage.   OPERATIVE DETAILS:  Following full informed consent, the patient was  brought to the operating room on the above-mentioned date and placed in  the supine position on the operating table.  Light intravenous sedation  was administered by the Anesthesia Team under the care and direction of  Dr. Diamantina Monks.  The patient's anterior chest was prepared and draped in  a sterile manner.  The very inferior portion of her sternal incision was  addressed.  At this level, the skin edges were macerated  and on manual  palpation drainage could be expressed.  The wound was probed with a  sterile Q-tip cotton gauze and a swab culture sent.  The wound tracts  locally a short distance in a cephalad direction.  In this area, the  wound was opened and a pocket of fluid was notably drained extending  down to the sternum.  Bone edges remained well-approximated.  There was  no evidence of fracture or dehiscence.  The wound does not tract further  in a cephalad direction.  The wound was irrigated with copious saline  solution and debrided of suture material.  A small wound VAC was applied  and connected directly to 125-mm continuous suction.  The patient  tolerated the procedure well and was transported to the Postanesthesia  Care Unit in stable condition.  There were no intraoperative  complications.      Salvatore Decent. Cornelius Moras, M.D.  Electronically Signed     CHO/MEDQ  D:  06/15/2009  T:  06/16/2009  Job:  [id number]

## 2011-04-19 NOTE — Assessment & Plan Note (Signed)
[facility]                            CARDIOLOGY OFFICE NOTE   NAME:[name], [name]                       MRN:          [id number]  DATE:05/02/2007                            DOB:          [birth year]    Primary care physician:  [name]. [name], M.D.   [name] is 70 years old and has a second-degree AV block and had a  Medtronic Kappa pacemaker implanted in 1993 with a Medtronic Kappa  generator change in 2003.  She says she has done quite well and has had  no chest pain or shortness of breath.  She does have occasional  palpitations.   PAST MEDICAL HISTORY:  1. Seizure disorder.  2. Hypertension.  3. She had normal coronary angiography in 1995.  4. She also has GERD.   CURRENT MEDICATIONS:  Temazepam, Dilantin, metoclopramide, simvastatin,  estrogen, tramadol, Lotrel 5/20 mg, naproxen, alprazolam and nabumetone.   PHYSICAL EXAMINATION:  VITAL SIGNS:  The blood pressure was 145/93 and  the pulse 88 and regular.  NECK:  There was no venous distention.  The carotid pulses were full  without bruits.  CHEST:  Clear without rales or rhonchi.  CARDIAC:  Cardiac rhythm was regular.  I could hear no murmurs or  gallops.  ABDOMEN:  Soft with normal bowel sounds.  There was no  hepatosplenomegaly.  EXTREMITIES:  Peripheral pulses were full and there was no peripheral  edema.   A resting EKG showed pacing in the atrium and pacing with fusion in the  ventricle.  On interrogation of her pacemaker, she was atrial-pacing  about half the time and ventricular-pacing only 4% of the time with an  AV delay programmed at 230.  She had good thresholds on both channels.  We cut her upper rate for rate response and for tracking from 120 to  110.  She only had one mode switch of about 7 seconds.   IMPRESSION:  1. Second-degree atrioventricular block, status post DDD pacemaker      implantation in 1993 with Medtronic Kappa generator change in 2003      with  good pacer function, not pacer-dependent.  2. Hypertension.  3. History of seizure disorder.  4. History of normal coronary angiography.   RECOMMENDATIONS:  I think [name] is doing quite well.  Her blood  pressure is somewhat borderline today but I will not change her  medications based on this one reading.  She is to see  Dr. Juanetta Wolf in July and can be reevaluated at that time.  I will plan to  see her back in cardiac follow-up in a year and we will follow her on  Tele-Trace in the interim.     Christine Elvera Lennox Juanda Chance, MD, [facility]  Electronically Signed    BRB/MedQ  DD: 05/02/2007  DT: 05/02/2007  Job #: [id number]   cc:   Christine Dredge L. [name], M.D.

## 2011-04-19 NOTE — H&P (Signed)
NAME:  MALAAK, STACH                ACCOUNT NO.:  [id number]   MEDICAL RECORD NO.:  [id number]          PATIENT TYPE:  OBV   LOCATION:  A212                          FACILITY:  [facility]   PHYSICIAN:  R. Roetta Sessions, M.D. DATE OF BIRTH:  [birth year]   DATE OF ADMISSION:  12/17/2007  DATE OF DISCHARGE:  01/14/2009LH                              HISTORY & PHYSICAL   PRIMARY CARE PHYSICIAN:  Ramon Dredge L. Juanetta Gosling, M.D.   CHIEF COMPLAINT:  Follow up dysphagia.   SUBJECTIVE:  Ms. Whitmill is a 70 year old female.  She was admitted to  [facility] and we were consulted.  Dr. Cira Servant actually saw Ms.  Enrique on December 18, 2007.  She has history of feeling as if there was  sausage that got stuck in her esophagus.  She complained of globus  sensation and a knot in her throat.  She began to have nausea and  vomiting.  She did have a Gastrografin swallowing study while  hospitalized which was normal.  She was later sent home once nausea and  vomiting resolved.  Since hospital discharge, she complains of frequent  heartburn and odynophagia which she describes as a burning sensation.  She denies any choking episodes.  She does feel as though food gets  stuck in her mid esophagus.  She had taken Protonix 40 mg daily.  She is  also taking Reglan 10 mg a.c. and h.s.  She denies any anorexia or early  satiety.  She is having soft brown bowel movements on a daily basis.  Denies any rectal bleeding or melena.  Denies any aspirin or NSAID use   PAST MEDICAL/SURGICAL HISTORY:  1. Hypertension.  2. Hyperlipidemia.  3. Gastroparesis.  4. Bradycardia requiring a pacemaker.  5. Seizure disorder.  6. Hysterectomy.  7. Hip replacement.  8. Knee replacement.  9. Thyroid surgery.  10.EGD November 2007, she was found to have a noncritical Schatzki's      ring and small hiatal hernia, otherwise normal exam.  11.She has history of right arm blood clot and was on Coumadin for      several months.  12.She has  history of gastric ulcers back in May 2007 found on EGD      when she presented with anemia and Hemoccult positive stool.  13.She has history of H. pylori treated previously with triple-drug      therapy based on CLO  testing.  14.She has osteoarthritis.  15.She had a colonoscopy Apr 27, 2005 and was found have internal      hemorrhoids and sigmoid diverticula.   CURRENT MEDICATIONS:  1. Dilantin 3 mg q.h.s.  2. Estropipate 0.625 mg daily.  3. Alprazolam 0.5 mg t.i.d.  4. Metoclopramide 10 mg a.c. and h.s.  5. Zocor 40 mg daily.  6. Norvasc unknown dose daily.  7. Lotensin unknown dose daily.  8. Protonix 40 mg daily.  9. Phenergan 25 mg p.r.n.  10.Ambien 10 mg q.h.s. p.r.n.  11.MiraLax 17 grams daily p.r.n.   ALLERGIES:  CODEINE.   FAMILY HISTORY:  There is no known family history of rectal carcinoma  or  other chronic GI problems.   SOCIAL HISTORY:  Ms. Merriwether is married.  She denies any tobacco, alcohol  or drug use.   REVIEW OF SYSTEMS:  See HPI, otherwise negative.   PHYSICAL EXAMINATION:  VITAL SIGNS:  Weight 196 pounds, height 62  inches, temperature 98.7, blood pressure 140/90, pulse 84.  GENERAL:  She is an elderly female who is alert, oriented, pleasant,  cooperative in no acute distress.  HEENT:  Sclerae are clear.  Conjunctivae are nonicteric.  PERRLA.  EOMI.  Oropharynx pink and moist without lesions.  NECK:  Supple without thyromegaly.  HEART:  Regular rate and rhythm, normal S1 and S2.  No rubs, clicks,  rubs or gallops..  LUNGS:  Clear to auscultation bilaterally.  ABDOMEN:  Positive bowel sounds x4.  No bruits auscultated.  Soft,  nontender without palpable mass or hepatosplenomegaly.  No rebound or  guarding.  EXTREMITIES:  Without clubbing or edema.   IMPRESSION:  Ms. Leas is a 70 year old female with a recent episode of  choking on a sausage which resulted in sudden nausea and vomiting.  Nausea and vomiting have resolved at this point in time.  She  continues  to have sensation of globus and odynophagia along with solid food  dysphagia.  She has history of patent Schatzki's ring.  I have discussed  this case further with Dr. Jena Gauss and we will pursue EGD with possible  esophageal dilatation in the near future to rule out complicated ring,  web or stricture.  It is possible she could have erosive esophagitis  given her odynophagia as well and eosinophilic esophagitis would be less  likely.   PLAN:  1. Increase Protonix to 40 mg b.i.d.  2. EGD with possible esophageal dilatations plus/minus biopsy by Dr.      Jena Gauss in the near future.      Lorenza Burton, N.P.      Jonathon Bellows, M.D.  Electronically Signed    KJ/MEDQ  D:  01/04/2008  T:  01/04/2008  Job:  [id number]   cc:   Ramon Dredge L. Juanetta Gosling, M.D.  Fax: [phone]

## 2011-04-19 NOTE — H&P (Signed)
HISTORY AND PHYSICAL EXAMINATION   May 18, 2009   Re:  [place], [place] P          DOB:  1940/12/09   PRESENTING CHIEF COMPLAINT:  Chest pain and exertional shortness of  breath.   HISTORY OF PRESENT ILLNESS:  The patient is a 70 year old obese, African  American female from [place], [place], who has been followed  by Dr. Juanda Chance for many years with known history of ischemic heart  disease and complete heart block.  The patient reports that she  originally suffered a myocardial infarction more than 30 years ago and  was treated medically.  She underwent placement of dual chamber  permanent pacemaker in 1999 for symptomatic bradycardia with AV block.  She required generator change 2 years ago for battery end of life.  She  has been followed annually by Dr. Juanda Chance and returned for routine  followup last week.  At that time, she reported having suffered to brief  episodes of substernal chest pain at rest approximately 2 weeks ago.  She describes an episode of severe substernal chest pain radiating  across her chest that awoke her from her sleep and lasted approximately  30 minutes before it subsided.  The next day she had a second similar  episode that did not last very long.  She has had no further chest pain  since.  Electrocardiogram performed in the office at the time of her  visit with Dr. Juanda Chance was different with diffuse inferior and  inferolateral T-wave changes that were not present on previous  electrocardiograms in the past.  She subsequently underwent elective  cardiac catheterization by Dr. Juanda Chance on Friday, May 15, 2009.  She was  found to have severe three-vessel coronary artery disease with mild left  ventricular dysfunction.  Cardiothoracic surgical consultation was  requested.   REVIEW OF SYSTEMS:  General:  The patient reports normal appetite.  She  is 5 feet 2 inches tall and weighs 218 pounds.  A year ago, she had lost  weight down to  approximately 145 pounds in the setting of acute  depression following death of her daughter.  She has since then gained  most of her weight loss back and she reports that her weight has been  fairly stable recently.  Cardiac:  The patient describes two brief  episodes of chest pain at rest, both of which occurred 2 weeks ago.  She  has not had any further episodes of chest discomfort since.  She does  describe a several-month history of exertional shortness of breath.  She  has a longstanding history of orthopnea, but this is stable and  unchanged.  She denies any PND, palpitations, or syncope.  She denies  any exertional chest discomfort.  Respiratory:  Notable for a 2-3 month  history of persistent dry, nonproductive cough.  The patient denies  productive cough, hemoptysis, wheezing.  Gastrointestinal:  Negative.  The patient reports no difficulty swallowing.  She denies hematochezia,  hematemesis, melena.  Genitourinary:  Negative.  Peripheral Vascular:  Negative.  Musculoskeletal:  Notable for chronic arthritis and  arthralgias that limit her mobility.  She particularly has problems with  her lower back and both knees.  She is still ambulatory and she still  drives an automobile.  Neurologic:  Notable for history of seizure  disorder in the distant past.  She has had no seizures since she  underwent pacemaker placement in 1999.  Psychiatric:  Negative.  HEENT:  Notable for recent  problems with the right eye.  She underwent cataract  extraction in March 2010.  She has had several infections in her right  eye since and has been told that she needs some minor ocular surgery  that, in fact, was scheduled for this week and has now been postponed.   PAST MEDICAL HISTORY:  1. Coronary artery disease.  2. Symptomatic bradycardia and complete heart block, status post      permanent pacemaker placement.  3. Status post generator change for battery end of life.  4. Hypertension.  5. Morbid  obesity.  6. Degenerative arthritis with limited physical mobility.  7. Hyperlipidemia.  8. GE reflux disease.  9. Seizure disorder.   PAST SURGICAL HISTORY:  1. Right knee replacement.  2. Left knee replacement.  3. Right total hip replacement.  4. Lower back surgery.  5. Partial thyroidectomy.  6. Hysterectomy.   FAMILY HISTORY:  Noncontributory.   SOCIAL HISTORY:  The patient is married and lives with her husband and  daughter in [place].  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  She now has to help care for her husband who has  some physical limitations due to previous neurologic injury.  She is a  nonsmoker.  She denies alcohol consumption.   CURRENT MEDICATIONS:  1. Patanol.  2. Nabumetone 500 mg daily.  3. Gabapentin 100 mg 3 times daily.  4. Dilantin 100 mg twice daily.  5. Darvocet as needed.  6. Protonix 40 mg daily.  7. Nexium 40 mg daily.  8. Simvastatin 40 mg daily.  9. Alprazolam 40 mg 3 times daily.  10.Zolpidem 10 mg daily.  11.Amlodipine/benazepril 1 tablet daily.  12.Metoclopramide 5 mg 4 tablets daily.   DRUG ALLERGIES:  Codeine causes nausea.   PHYSICAL EXAMINATION:  GENERAL:  The patient is morbidly obese African  American female, who appears her stated age in no acute distress.  VITAL SIGNS:  Blood pressure 136/90, pulse 77 and regular, and oxygen  saturation 97% on room air.  HEENT:  Unrevealing.  NECK:  Supple.  There is no cervical nor supraclavicular  lymphadenopathy.  There is no jugular venous distention.  No carotid  bruits noted.  LUNGS:  Auscultation of the chest demonstrates diminished breath sounds  at both lung bases.  No wheezes or rhonchi are noted.  CARDIOVASCULAR:  Regular rate and rhythm.  No murmurs, rubs, or gallops  are noted.  ABDOMEN:  Obese, but soft and nontender.  EXTREMITIES:  Warm and well perfused.  Pulses are palpable in the  dorsalis pedis position.  There is moderate bilateral lower  extremity  edema.  SKIN:  Clean, dry, healthy-appearing throughout.  RECTAL/GU:  Deferred.  NEUROLOGIC:  Grossly nonfocal and symmetrical throughout.   DIAGNOSTIC TESTS:  Cardiac catheterization performed by Dr. Juanda Chance on  May 15, 2009, is reviewed.  This demonstrates long segment tubular 80%  stenosis of the proximal and mid left anterior descending coronary  artery.  There is a 100% proximal occlusion of left circumflex coronary  artery with left-to-left collateral filling of two relatively small  circumflex marginal branches, the first of which is larger than the  second.  There is subtotal occlusion of the mid-right coronary artery  with very late right-to-right flow on the distal right coronary artery.  The terminal branch of the right coronary artery may or may not be  graftable.  There is mild-to-moderate left ventricular dysfunction with  inferior wall hypokinesis.  Ejection fraction is estimated perhaps  50%.   IMPRESSION:  Severe three-vessel coronary artery disease with mild left  ventricular dysfunction.  The patient had two brief episodes of chest  pain at rest approximately 2 weeks ago with no chest pain since then.  She has a several-month history of exertional shortness of breath.  I  agree that she would best be treated with surgical revascularization.  Alternatives include percutaneous coronary intervention and stenting of  the left anterior descending coronary artery with medical treatment for  the remainder.  Surgical revascularization would probably come with  improved long-term survival and decreased risk of future cardiac event.  Risks of surgery will be elevated due to the presence of the patient's  morbid obesity and other multiple comorbid medical problems.   PLAN:  I have discussed options at length with the patient and her  daughter here in the office today.  Alternative treatment strategies  have been discussed.  She desires to proceed with surgery, but  she  favors holding off until next week.  Her schedule is somewhat  complicated, so we will tentatively plan for surgery on Friday, May 29, 2009.  I have given her a prescription for sublingual nitroglycerin to  use during the interim period of time should she develop any recurrent  chest pain.  She has also been instructed to call EMS and present to the  emergency room acutely should she develop any chest pain not promptly  relieve with administration of sublingual nitroglycerin.  All of their  questions have been addressed.   Salvatore Decent. Cornelius Moras, M.D.  Electronically Signed   CHO/MEDQ  D:  05/18/2009  T:  05/19/2009  Job:  [id number]   cc:   Everardo Beals. Juanda Chance, MD, [facility]  Oneal Deputy. Juanetta Gosling, M.D.

## 2011-04-19 NOTE — H&P (Signed)
NAMECARLISHA, [name]                ACCOUNT NO.:  [id number]   MEDICAL RECORD NO.:  [id number]          PATIENT TYPE:  OBV   LOCATION:  A212                          FACILITY:  [facility]   PHYSICIAN:  Edward L. Juanetta Gosling, M.D.DATE OF BIRTH:  Aug 08, 1941   DATE OF ADMISSION:  12/17/2007  DATE OF DISCHARGE:  [facility]                              HISTORY & PHYSICAL   [name] is admitted because of chest discomfort.  She feels like she  has a knot in her chest.  She had actually been to the emergency room on  December 15, 2007 with what seemed to be a food impaction.  She then had  nausea and vomiting.  She eventually seemed to clear the food impaction,  and then started having chest discomfort since.  To some extent, it  seems to be worse when she swallows.  It is substernal and does not  radiate.   PAST MEDICAL HISTORY:  1. Hypertension.  2. Hyperlipidemia.  3. An irregular heartbeat, for which she is had a pacemaker.  4. Seizure disorder.  5. She has a history of gastroparesis in the past.   PAST SURGICAL HISTORY:  1. She has had back surgery.  2. Hip replacement.  3. Knee replacement.  4. Thyroid surgery.  5. Wrist surgery.   SOCIAL HISTORY:  She lives at home with her husband.  She is a  nonsmoker.  She does not drink any alcohol.  She does not use any  illicit drugs.   FAMILY HISTORY:  Positive for heart disease, and also positive for  esophageal problems.   PHYSICAL EXAMINATION:  GENERAL:  Shows a well-developed, well-nourished  female who appears to be in no acute distress now.  VITAL SIGNS:  Blood pressure 128/80, pulse 80, respirations are 16.  She  is afebrile.  HEENT:  Her pupils are reactive to light and accommodation.  Her nose  and throat are clear.  NECK:  Supple.  No bruits.  CHEST:  Fairly clear.  HEART:  Regular, without gallop.  ABDOMEN:  Soft.  EXTREMITIES:  Showed no edema.   She had a D-dimer done in the emergency room that was mildly elevated,  but she said  she has had a ventilation/perfusion lung scan that is not  positive.  Her white blood count is 14,600, hemoglobin is 13.7,  platelets 227.  Potassium was 2.8, so that will be replaced.  She has an  elevated SGOT and SGPT.  Lipase is normal.  So far, cardiac studies are  negative.   ASSESSMENT:  She has chest discomfort that I think is probably  esophageal, but I am going to have her evaluated with a rule-out MI and  then ask for the GI team to see her and perhaps look into her esophagus.      Edward L. Juanetta Gosling, M.D.  Electronically Signed     ELH/MEDQ  D:  12/17/2007  T:  12/18/2007  Job:  [id number]

## 2011-04-19 NOTE — Assessment & Plan Note (Signed)
OFFICE VISIT   [name], [name]  DOB:  [birth year]                                        July 20, 2009  CHART #:  [id number]   The patient returns to the office today for further followup related to  coronary artery bypass grafting and subsequent sternal wound infection.  She was initially readmitted to the hospital in early July with  cellulitis and a superficial sternal wound infection related to fat  necrosis involving the subcutaneous tissues along the inferior aspect of  her sternal wound.  Multiple attempts at local control were unsuccessful  and ultimately her sternal wound had to be debrided down to the level of  the sternum which had not yet healed.  Once all of her wires were  removed and her wound controlled clinically, consultation with Dr.  Noelle Penner from the plastic surgical team was obtained and the patient  underwent rectus abdominis muscle flap closure.  Since then she has done  quite well.  Following hospital discharge, she has continued to slowly  improve.  She complains of soreness in her chest that still limits her  somewhat, but otherwise she has no chest pain to suggest recurrence of  coronary artery disease.  She has stable moderate exertional shortness  of breath.  She denies any fevers or chills.  The JP drain that remains  in place has had minimal output in recent days.  Otherwise, she has no  complaints.   PHYSICAL EXAMINATION:  GENERAL:  A well-appearing morbidly obese female.  VITAL SIGNS:  Blood pressure 130/82, pulse 84, and oxygen saturation 98%  on room air.  CHEST:  Her sternal incision is clean and intact.  Half of the sutures  have already been removed, at this point I suspect the rest could be  removed safely.  Similarly, there are few skin staples inferiorly.  There is skin staples and sutures for her abdominal incision.  This  incision also looks intact and I suspect that the sutures and staples  can be removed.  The  Jackson-Pratt drainage output appears clear and  minimal at this time.  LUNGS:  Auscultation of the chest demonstrates clear breath sounds which  are symmetrical.  No wheezes or rhonchi are noted.  CARDIOVASCULAR:  Regular rate and rhythm.  No murmurs, rubs, or gallops  are appreciated.  ABDOMEN:  Soft, nondistended, nontender.  EXTREMITIES:  Warm and adequately perfused.  There is moderate bilateral  lower extremity edema that is stable.   DIAGNOSTIC TEST:  Chest x-ray performed today at the [facility] is reviewed.  This demonstrates clear lung fields bilaterally.  There are no pleural effusions.  No other significant abnormalities are  noted.   IMPRESSION:  The patient appears to be doing well.  Once Dr. Noelle Penner is  satisfied from the standpoint of recovery from her recent rectus  abdominis muscle flap surgery, I suspect the patient could go ahead and  get started in the outpatient cardiac rehab program.  We have contacted  Dr. Kallie Edward office today in an effort to see if they would like [place] to  go ahead and remove her remaining skin sutures, staples, and Al Pimple drain.  We will get her an appointment to see Dr. Noelle Penner as soon  as possible in the office as well.  All of her questions  have been  addressed.  In the meanwhile, I have encouraged the patient to try to  work on getting up and ambulating a bit more each day as time goes on.  All of her questions have been answered.   Salvatore Decent. Cornelius Moras, M.D.  Electronically Signed   CHO/MEDQ  D:  07/20/2009  T:  07/21/2009  Job:  [id number]   cc:   Everardo Beals. Juanda Chance, MD, [facility]  Loreta Ave, MD

## 2011-04-19 NOTE — Consult Note (Signed)
[name], [name]                ACCOUNT NO.:  [id number]   MEDICAL RECORD NO.:  [id number]          PATIENT TYPE:  INP   LOCATION:  2312                         FACILITY:  [facility]   PHYSICIAN:  Loreta Ave, MD DATE OF BIRTH:  [birth year]   DATE OF CONSULTATION:  06/26/2009  DATE OF DISCHARGE:                                 CONSULTATION   REFERRING PHYSICIAN:  Salvatore Decent. Cornelius Moras, MD   CHIEF COMPLAINT:  Sternal wound.   HISTORY OF PRESENT ILLNESS:  [name] is a 70 year old morbidly obese  nondiabetic female, who underwent coronary artery bypass grafting by Dr.  Cornelius Moras on May 25, 2009, for coronary artery disease.  She had an  uneventful postoperative recovery and was discharged home on May 29, 2009, without any evidence of postoperative wound infection.  She  presented to the emergency room on June 26, 2009, with increasing  swelling, tenderness, and serosanguineous drainage from the inferior  aspect of her sternal wound.  She was treated initially with IV  antibiotics and observation.  She defervesced and clinically improved.  However, she was taken to the operating room on June 15, 2009, because  of recurrence of drainage from her sternal wound.  She was debrided on  June 15, 2009, by Dr. Cornelius Moras, and she had a repeat debridement 4 days  later on June 19, 2009.  She has maintained her IV antibiotic therapy  and this was switched to Avelox yesterday.  In the meantime, she has had  a VAC dressing on her sternal wound, which was removed this morning by  Dr. Cornelius Moras for my inspection.   PAST MEDICAL HISTORY:  Significant for:  1. Hypertension.  2. Coronary artery disease.  3. Gastroparesis.  4. GERD with hiatal hernia.  5. Diverticulosis.  6. History of prior myocardial infarction.  7. Pacemaker placement for bradycardia.  8. Seizure disorder.  9. Status post thyroid surgery.  10.Status post back surgery.  11.Morbid obesity.   MEDICATIONS:  She takes:  1. Alprazolam 2 mg  p.o. nightly. p.r.n. for anxiety.  2. Norvasc 10 mg p.o. daily.  3. Benazepril 20 mg p.o. daily.  4. Colace 100 mg p.o. b.i.d.  5. Lovenox 40 mg subcu daily.  6. Estropipate 0.625 mg p.o. daily.  7. Ferrous sulfate 325 mg p.o. b.i.d.  8. Lasix 40 mg p.o. b.i.d.  9. Neurontin 100 mg p.o. t.i.d.  10.Insulin sliding scale.  11.Lactulose 45 mL p.o.  12.Reglan 5 mg p.o. a.c. and h.s.  13.Lopressor 25 mg p.o. b.i.d.  14.Avelox 400 mg IV daily.  15.Relafen 500 mg p.o. daily.  16.Multivitamin p.o. daily.  17.Protonix 40 mg p.o. daily.  18.Dilantin 200 mg p.o. nightly.  19.MiraLax 17 g p.o. daily.  20.Ambien 5 mg p.o. nightly.  21.Albuterol 2.5 mg inhaled q.2 h. p.r.n. for wheezing or dyspnea.  22.Oxycodone/Tylenol 1-2 tablets p.o. q.6 h. p.r.n. for pain.  23.Ultram 50 mg p.o. q.4 h. p.r.n. for pain.   SOCIAL HISTORY:  She is married, lives with her husband.  Denies  tobacco, alcohol, or IV drug use.   FAMILY HISTORY:  Noncontributory.  PHYSICAL EXAMINATION:  VITAL SIGNS:  She is 98.2, heart rate 61,  respirations 20, and blood pressure is 88/47.  She is 96% on room air.  NEUROLOGIC:  She is alert, interactive, and appropriate, in no acute  distress.  Cranial nerves II through XII are intact.  LUNGS:  Clear to auscultation bilaterally.  HEART:  Regular rate and rhythm.  ABDOMEN:  Obese, soft, and nontender with a well-healed hysterectomy  scar and well-healed mediastinal drain sites.  She has trace dependent  edema, 2+ radial pulses bilaterally.  Examination of her sternum reveals  a 12 x 6 x 4 cm wound.  At the base is her sternal closure, which has  had wires removed.  It is slightly mobile.  There is some rim of  necrotic debris, however, the skin is viable in its entirety.  There is  no obvious purulence.  There is no associated erythema.   ASSESSMENT/PLAN:  [name] is a 70 year old morbidly obese female  with a sternal wound infection, status post coronary artery  bypass  graft.   I believe that [name] would benefit from flap coverage of her wound.  She would likely need further operative debridement for that, however.  Dr. Cornelius Moras plans to take the patient back to the operating room today for  more formal debridement and removal of her sternal wires.  Given the  nature of her wound being in the inferior third of her sternum, I  believe a vertical rectus abdominis myocutaneous flap would be the best  method of obtaining a stable wound.  Likely, this will be without  associated skin paddle and will be able to cover the majority of her  sternal wound on its deep aspect and for chest skin, should be able to  provide stable coverage over the muscle flap.  I discussed this with Ms.  Wolf and will discuss it further with her family in the coming days.  I  will plan to do this in 3-4 days after her debridement today.  She  should continue her Avelox in the interim, and I will follow along  closely.      Loreta Ave, MD  Electronically Signed     CF/MEDQ  D:  06/26/2009  T:  06/27/2009  Job:  [id number]

## 2011-04-19 NOTE — Op Note (Signed)
[name], [name]                ACCOUNT NO.:  [id number]   MEDICAL RECORD NO.:  [id number]          PATIENT TYPE:  INP   LOCATION:  3007                         FACILITY:  [facility]   PHYSICIAN:  Tia Alert, MD     DATE OF BIRTH:  [birth year]   DATE OF PROCEDURE:  10/16/2008  DATE OF DISCHARGE:                               OPERATIVE REPORT   PREOPERATIVE DIAGNOSES:  1. Lumbar spinal stenosis, L4-L5.  2. Spondylolisthesis L4-L5.  3. Segmental instability, L4-L5.  4. Back and leg pain.   POSTOPERATIVE DIAGNOSES:  1. Lumbar spinal stenosis, L4-L5.  2. Spondylolisthesis L4-L5.  3. Segmental instability, L4-L5.  4. Back and leg pain.   PROCEDURES:  1. Decompressive lumbar hemilaminectomy, hemi facetectomy, and      foraminotomy, L4-L5 on the left for decompression of left L4-L5      nerve roots.  To address lumbar spinal stenosis, requiring more      work than it has typically required to the transforaminal lumbar      interbody fusion.  2. Transforaminal lumbar interbody fusion, L4-L5 from the left      utilizing a 10-mm x 30-mm PEEK interbody cage packed with Osteocel.  3. Intertransverse arthrodesis, L4-L5 utilizing Osteocel Actifuse      putty and locally harvested morselized autologous bone graft.  4. Nonsegmental fixation, L4-L5 utilizing the XIA pedicle screw      system.   SURGEON:  Tia Alert, MD   ASSISTANT:  Reinaldo Meeker, MD   ANESTHESIA:  General endotracheal.   COMPLICATIONS:  None apparent.   INDICATIONS FOR PROCEDURE:  [name] is a 70 year old female who  presented with severe left leg pain.  She had undergone a decompressive  laminectomy at L4-L5 in the past.  She had grade L1-L2 spondylolisthesis  at L4-L5 with degenerative disk disease and significant spinal and  foraminal stenosis.  I recommended a lumbar re-exploration with repeat  decompression followed by instrumented fusion to address both the spinal  stenosis and the segmental  instability.  She understood the risks,  benefits, expected outcome, and wished to proceed.   DESCRIPTION OF PROCEDURE:  The patient was taken to the operating room  and after induction of adequate generalized endotracheal anesthesia, she  was rolled in prone position on chest rolls and all pressure points were  padded.  The lumbar region was prepped with DuraPrep and then draped in  usual sterile fashion.  A 10 mL local anesthesia was injected.  An old  incision was ellipsed out and the incision was carried down through the  lumbosacral fascia to expose the spinous processes of L3 and L5.  The L4  spinous process was gone.  I dissected out over the facets to identify  the transverse processes of L4 and L5.  An intraoperative fluoroscopy  confirmed my level, then I spent time removing the scar tissue from the  superior surface of the superior lamina of L4.  I was then able to get  up under the lamina after dissected the scar tissue away and completing  the laminectomy, L4-L5 on  the left side, across the pars and therefore  removed the inferior facet.  As I was unable to widen the foraminotomy  at this level, I identified the L4 and the L5 nerve roots detethered  them from the scar tissue, performed a very generous foraminotomy by  removing the superior facet also at superior end marching along the L4  nerve root.  There was a large disk herniation and pseudo disk  herniation at L4-L5.  I incised this and performed a thorough  intradiskal diskectomy from the left side.  I was planning on doing a  posterior lumbar interbody fusion, bilateral diskectomies, and graft  placement, however, the scar was very thick on the patient's right side.  I had quite a difficult time detethering the dura from the undersurface  of the bone.  Therefore, I decided rather than to potentially create a  CSF leak, I decided to switch to a transforaminal lumbar interbody  fusion from the left side.  She had only  left leg pain.  I was able to  distract the disk space up to 10 mm with fairly good reduction of the  spondylolisthesis to a grade 1 listhesis.  I was able to clean the disk  space out with the curettes and pituitary rongeurs.  I was able to clean  out past the midline.  I then used a 10-mm x 30-mm PEEK interbody cage  and tapped this in the transforaminal lumbar interbody fusion  trajectory.  From the left side across the midline, I packed the disk  space with local autograft and Actifuse putty.  I then turned my  attention to the nonsegmental fixation.  I localized the pedicle screw  entry zones at L4 and L5, probed each pedicle with a pedicle probe,  tapped each pedicle with a 5/5 tap and has placed 6.75-mm x 45-mm  pedicle screws in the L4 pedicles bilaterally and 6.75-mm x 40-mm  pedicle screws in the L5 pedicles bilaterally.  I checked these with AP  and lateral fluoroscopy.  I then decorticated the transverse processes  and placed a mixture of local autograft, Actifuse putty, and Osteocel  out over these to perform an intertransverse arthrodesis at L4-L5.  I  then placed lordotic rods into multiaxial screw heads of pedicle screws  and locked these into position with a locking caps and anti-torque  device.  I then irrigated with saline solution containing bacitracin,  dried all bleeding points, placed a medium Hemovac drain through a  separate stab incision, lined the dura with Gelfoam and closed the  fascia with 0 Vicryl and closed subcutaneous, and subcuticular tissue  with 2-0 and 3-0 Vicryl and I then closed the skin with Benzoin Steri-  Strips.  Drapes were removed.  Sterile dressing was applied.  The  patient was awakened from general anesthesia and transferred to recovery  room in stable condition.  At the end of the procedure all sponges,  needle, and instrument counts were correct.      Tia Alert, MD  Electronically Signed     DSJ/MEDQ  D:  10/16/2008  T:   10/17/2008  Job:  [id number]

## 2011-04-19 NOTE — Assessment & Plan Note (Signed)
[facility]                            CARDIOLOGY OFFICE NOTE   NAME:[name], [name]                       MRN:          [id number]  DATE:04/17/2008                            DOB:          [birth year]    PRIMARY CARE PHYSICIAN:  Ramon Dredge L. Juanetta Gosling, M.D.   CLINICAL HISTORY:  [name] is 70 years old and returns for follow-up  management of her pacemaker.  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  She currently has a Medtronic Kappa 701 pacemaker in place.  She  has done quite well from that standpoint.  She has had no chest pain,  shortness breath or palpitations.   PAST MEDICAL HISTORY:  1. Hypertension.  2. Seizure disorder.  3. Gastroesophageal reflux disease.  She was hospitalized in January      2009 with chest pain, which was thought to be related to GERD.  4. She had normal coronary angiography in 1995.   CURRENT MEDICATIONS:  1. Simvastatin.  2. Nexium.  3. Gabapentin.  4. Dilantin.  5. Metoclopramide.  6. Zolpidem.  7. Lotrel 5/20.  8. Estrogen.   PHYSICAL EXAMINATION:  Blood pressure 142/90.  On examination there was  no venous distension.  The carotid pulses were full without bruits.  CHEST:  Clear.  HEART:  Rhythm was regular.  I could hear no murmurs or gallops.  ABDOMEN:  Soft.  No organomegaly.  EXTREMITIES:  Peripheral pulses were full.  There was no peripheral  edema.   We interrogated her pacemaker and she had good thresholds on both leads.  She was atrial pacing 78% of the time and ventricular pacing 7% of the  time.  She is not pacer dependent.   IMPRESSION:  1. Status post DDD pacemaker implantation in 1993, with Medtronic      Kappa generator change in 2003 for second-degree AV block.  With      good pacer function and not pacer dependent.  2. Hypertension.  3. History of seizure disorder.  4. History of normal coronary angiography.  5.  Gastroesophageal reflux disease.   RECOMMENDATIONS:  I think [name] is doing quite well.  We will plan  to continue to follow her.  I will see her back in followup in one year.     Bruce Elvera Lennox Juanda Chance, MD, [facility]  Electronically Signed    BRB/MedQ  DD: 04/17/2008  DT: 04/17/2008  Job #: [id number]   cc:   Ramon Dredge L. Juanetta Gosling, M.D.

## 2011-04-19 NOTE — Op Note (Signed)
NAMESCARLETH, BRAME                ACCOUNT NO.:  [id number]   MEDICAL RECORD NO.:  [id number]          PATIENT TYPE:  INP   LOCATION:  2301                         FACILITY:  [facility]   PHYSICIAN:  Salvatore Decent. Cornelius Moras, M.D. DATE OF BIRTH:  [birth year]   DATE OF PROCEDURE:  05/28/2009  DATE OF DISCHARGE:                               OPERATIVE REPORT   PREOPERATIVE DIAGNOSIS:  Severe 3-vessel coronary artery disease.   POSTOPERATIVE DIAGNOSIS:  Severe 3-vessel coronary artery disease.   PROCEDURE:  Median sternotomy for coronary artery bypass grafting x3  (left internal mammary artery to distal left anterior descending  coronary artery, saphenous vein graft to first circumflex marginal  branch, saphenous vein graft to posterior descending coronary artery,  endoscopic saphenous vein harvest from right thigh).   SURGEON:  Salvatore Decent. Cornelius Moras, MD   FIRST ASSISTANT:  Salvatore Decent. Dorris Fetch, MD   SECOND ASSISTANT:  Rowe Clack, PA-C   ANESTHESIA:  General.   BRIEF CLINICAL NOTE:  The patient is a 70 year old obese, African  American female from [place], [place] with longstanding  history of ischemic heart disease and complete heart block.  The patient  now presents with recent onset of substernal chest pain at rest  consistent with angina pectoris.  Followup cardiac catheterization  performed by Dr. Charlies Constable demonstrates severe 3-vessel coronary  artery disease with mild left ventricular dysfunction.  A full  consultation note has been dictated previously.  The patient and her  family have been counseled at length regarding the indications, risks,  and potential benefits of surgery.  Alternative treatment strategies  have been discussed.  They understand and accept all potential  associated risks and desire to proceed with surgery as described.   OPERATIVE FINDINGS:  1. Normal left ventricular systolic function.  2. Mild-to-moderate left ventricular hypertrophy.  3.  Diffuse coronary artery disease with fair-quality target vessels      for grafting.  4. Good-quality left internal mammary artery and saphenous vein      conduit for grafting.   OPERATIVE NOTE IN DETAIL:  The patient was brought to the operating room  on the above-mentioned date and central monitoring was established by  the Anesthesia team under the care and direction Dr. Judie Petit.  Specifically, a Swan-Ganz catheter was placed through the right internal  jugular approach.  A radial arterial line was placed.  Intravenous  antibiotics were administered.  Following the induction with general  endotracheal anesthesia, a Foley catheter was placed.  The patient's  chest, abdomen, both groins, and both lower extremities were prepared  and draped in sterile manner.  Baseline transesophageal echocardiogram  was performed by Dr. Randa Evens.  This demonstrates essentially normal left  ventricular systolic function.  There is mild-to-moderate left  ventricular hypertrophy.  There is trivial mitral regurgitation.   A median sternotomy incision was performed and the left internal mammary  artery was dissected from the chest wall and prepared for bypass  grafting.  The left internal mammary artery is good-quality conduit.  Simultaneously, saphenous vein was obtained from the patient's right  thigh using endoscopic  vein harvest technique through a small incision  just below the right knee.  The saphenous vein is good-quality conduit.  After the saphenous vein has been removed, the small incisions in the  right lower extremity are closed with absorbable suture.  Following  systemic heparinization, the left internal mammary artery was transected  distally and noted to have excellent flow.   The pericardium was opened.  The ascending aorta is normal in  appearance.  The ascending aorta and the right atrium were cannulated  for cardiopulmonary bypass.  Cardiopulmonary bypass is begun and the   surface of the heart was inspected.  Distal target vessels were selected  for coronary artery bypass grafting.  Of note, the second circumflex  marginal branch of the distal left circumflex coronary artery is too  small for grafting.  A temperature probe was placed in the left  ventricular septum and a cardioplegic cannula was placed in the  ascending aorta.   The patient is allowed to cool passively to 32 degrees systemic  temperature.  The aortic cross-clamp was applied and cold blood  cardioplegia is delivered in an antegrade fashion through the aortic  root.  Iced saline slush was applied for topical hypothermia.  The  initial cardioplegic arrest and myocardial cooling was excellent.  Repeat doses of cardioplegia are administered intermittently throughout  the cross-clamp portion of the operation through the aortic root and  down the subsequently placed vein grafts to maintain left ventricular  septal temperature below 15 degrees centigrade and completely flat  electrocardiogram.   The following distal coronary anastomoses were performed:  1. The first circumflex marginal branch is grafted with a saphenous      vein graft in an end-to-side fashion.  This vessel measures 2.0 mm      in diameter and is a fair-quality target vessel for grafting.      There is diffuse plaque within this vessel, both proximally and      distally.  2. The posterior descending coronary artery is grafted with a      saphenous vein graft in an end-to-side fashion.  This vessel was      diffusely diseased and a relatively poor target vessel for      grafting.  It measured 2.0 mm in diameter and is at the site of      distal grafting just beyond the bifurcation of the distal right      coronary artery.  3. The distal left anterior descending coronary artery is grafted with      left internal mammary artery in an end-to-side fashion.  This      vessel measures 2.0 mm in diameter and is a good-quality target       vessel for grafting.   Both proximal saphenous vein anastomoses were performed directly to the  ascending aorta prior to removal of the aortic cross-clamp.  The left  ventricular septal temperature rises rapidly with reperfusion of the  left internal mammary artery.  The aortic cross-clamp was removed after  a total cross-clamp time of 51 minutes.  The heart was defibrillated.  Paced rhythm resumed.  Epicardial pacing wires were fixed to the right  ventricular free wall into the right atrial appendage.  The patient is  rewarmed to 37 degrees centigrade temperature.  The patient is weaned  from cardiopulmonary bypass without difficulty.  The patient's rhythm at  separation from bypass is AV paced.  Total cardiopulmonary bypass time  of the operation was 65 minutes.  No inotropic support is required.  Followup transesophageal echocardiogram performed after separation from  bypass demonstrates normal left ventricular function.   The venous and arterial cannulae are removed uneventfully.  Protamine  was administered to reverse the anticoagulation.  The mediastinum and  the left chest are irrigated with saline solution.  Meticulous surgical  hemostasis was ascertained.  The mediastinum and the left pleural space  were drained using 3 chest tubes exited through separate stab incisions  inferiorly.  The pericardium and soft tissues anterior to the aorta are  reapproximated loosely.  The sternum was closed with double-strength  sternal wire.  The soft tissues anterior to the sternum are closed in  multiple layers and the skin is closed with a running subcuticular skin  closure.   The patient tolerated the procedure well and was transported to Surgical  Intensive Care Unit in stable condition.  There are no intraoperative  complications.  All sponge, instrument, and needle counts were verified  correct at the completion of the operation.  No blood products were  administered.       Salvatore Decent. Cornelius Moras, M.D.  Electronically Signed     CHO/MEDQ  D:  05/28/2009  T:  05/29/2009  Job:  [id number]   cc:   Everardo Beals. Juanda Chance, MD, [facility]  Oneal Deputy. Juanetta Gosling, M.D.

## 2011-04-19 NOTE — Op Note (Signed)
NAME:  [name], [name]                ACCOUNT NO.:  [id number]   MEDICAL RECORD NO.:  [id number]          PATIENT TYPE:  AMB   LOCATION:  DAY                           FACILITY:  [facility]   PHYSICIAN:  R. Roetta Sessions, M.D. DATE OF BIRTH:  28-Jul-1941   DATE OF PROCEDURE:  01/18/2008  DATE OF DISCHARGE:                               OPERATIVE REPORT   PROCEDURE PERFORMED:  Esophagogastroduodenoscopy with [name],  biopsy, and disruption Schatzki's ring.   INDICATIONS FOR PROCEDURE:  70 year old lady with recent symptoms  consistent with esophageal dysphagia and transient food impaction, has a  known Schatzki's ring requiring dilation previously.  She comes for EGD  with dilation as appropriate.  This approach has been discussed with the  patient at length.  The potential risks, benefits and alternatives have  been reviewed, questions answered, she is agreeable.  Please see  documentation in the medical record.   PROCEDURE NOTE:  O2 saturation, blood pressure, pulse, and respirations  were monitored throughout the entire procedure.  Conscious sedation with  Versed 3 mg IV and Demerol 50 mg IV in divided doses.  Instrument Pentax  video chip system.   FINDINGS:  The tubular esophagus revealed a Schatzki's ring.  The  esophageal mucosa, otherwise, appeared normal.  EG junction easily  traversed.  The gastric cavity was emptied and insufflated well with  air.  A thorough examination of the gastric mucosa including  retroflexion in the proximal stomach esophagogastric junction  demonstrated only a small hiatal hernia.  The pylorus was patent, easily  traversed, and the bulb and second portion revealed no abnormalities.   THERAPEUTIC/DIAGNOSTIC MANEUVERS PERFORMED:  The scope was withdrawn.  A  56 French Maloney dilator was passed to full insertion with a mild to  moderate resistance.  Look back revealed the ring had only been  partially ruptured.  I did not feel she would tolerate a  58 French  dilator, so I obtained biopsy forceps and performed four quadrant  bites of the ring to disrupt it further.  This was done effectively  without apparent complication.  The patient tolerated the procedure well  and was reacted in endoscopy.   IMPRESSION:  Schatzki's ring, otherwise, normal esophagus status post  biopsy/disruption as described above. Otherwise normal esophagus. Hiatal  hernia, otherwise, normal stomach, D1 and D2.   RECOMMENDATIONS:  1. Continue Protonix 40 mg orally daily.  2. [name] is to call if she has any future difficulty swallowing.      Jonathon Bellows, M.D.  Electronically Signed     RMR/MEDQ  D:  01/18/2008  T:  01/19/2008  Job:  [id number]   cc:   Ramon Dredge L. Juanetta Gosling, M.D.  Fax: [phone]

## 2011-04-19 NOTE — Group Therapy Note (Signed)
[name], [name]                ACCOUNT NO.:  [id number]   MEDICAL RECORD NO.:  [id number]          PATIENT TYPE:  OBV   LOCATION:  A212                          FACILITY:  [facility]   PHYSICIAN:  Edward L. Juanetta Gosling, M.D.DATE OF BIRTH:  1940/12/26   DATE OF PROCEDURE:  DATE OF DISCHARGE:                                 PROGRESS NOTE   [name] says she is better.  She has no chest discomfort.  She has  some burning sensation when she swallows.   EXAM:  Shows temperature is 98.8, pulse is 76, respirations 20, blood  pressure 132/70, O2 sats 97% on room air.  She is ruled out for MI.   She has had persistently elevated troponin level but CK-MB is negative.   ASSESSMENT:  She is better.   PLAN:  Going to ask GI consultation.      Edward L. Juanetta Gosling, M.D.  Electronically Signed     ELH/MEDQ  D:  12/18/2007  T:  12/18/2007  Job:  [id number]

## 2011-04-19 NOTE — Op Note (Signed)
[name], [name]                ACCOUNT NO.:  [id number]   MEDICAL RECORD NO.:  [id number]          PATIENT TYPE:  OIB   LOCATION:  3535                         FACILITY:  [facility]   PHYSICIAN:  Tia Alert, MD     DATE OF BIRTH:  09-09-1941   DATE OF PROCEDURE:  11/07/2008  DATE OF DISCHARGE:  11/07/2008                               OPERATIVE REPORT   PREOPERATIVE DIAGNOSIS:  Mild lumbar wound dehiscence.   POSTOPERATIVE DIAGNOSIS:  Mild lumbar wound dehiscence.   PROCEDURE:  Revision of lumbar wound.   SURGEON:  Tia Alert, MD   ANESTHESIA:  General endotracheal.   COMPLICATIONS:  None apparent.   INDICATIONS FOR PROCEDURE:  [name] is a 70 year old female who  underwent a lumbar fusion about 4 weeks ago.  She presented with some  drainage of her wound and had a very mild early dehiscence.  She was put  on antibiotics.  I felt that the wound needed some very superficial  debridement.  We tried to get her into the [facility], but could not  see until November 12, 2008.  Therefore, we figured we could debride it  in the operating room.  She understood the risks, benefits, and expected  outcome and wished to proceed.   DESCRIPTION OF PROCEDURE:  The patient was taken to the operating room,  and after induction of adequate general endotracheal anesthesia, she was  rolled in prone position on chest rolls, and all pressure points were  padded.  Her lumbar region was prepped with DuraPrep and then draped in  the usual sterile fashion.  A 5 mL of local anesthesia was injected, and  the small area of wound dehiscence was ellipsed out with a 10-blade  scalpel.  I debrided this back to bleeding tissues and cleaned up the  wound edges.  I then closed the wound primarily with layers of 2-0 in  the subcutaneous tissues and 3-0 in the subcuticular tissues.  The skin  was then closed with benzoin and Steri-Strips.  The drapes were removed.  A sterile dressing was applied.  The  patient was awakened from general  anesthesia and transferred to recovery room in stable condition.  At the  end of the procedure, all sponge, needle, and instrument counts were  correct.      Tia Alert, MD  Electronically Signed     DSJ/MEDQ  D:  11/07/2008  T:  11/08/2008  Job:  [id number]

## 2011-04-19 NOTE — Op Note (Signed)
[name], [name]                ACCOUNT NO.:  [id number]   MEDICAL RECORD NO.:  [id number]          PATIENT TYPE:  INP   LOCATION:  2010                         FACILITY:  [facility]   PHYSICIAN:  Salvatore Decent. Cornelius Moras, M.D. DATE OF BIRTH:  09-18-1941   DATE OF PROCEDURE:  06/19/2009  DATE OF DISCHARGE:                               OPERATIVE REPORT   PREOPERATIVE DIAGNOSIS:  Sternal wound infection.   POSTOPERATIVE DIAGNOSIS:  Sternal wound infection.   PROCEDURE:  Sternal wound debridement.   SURGEON:  Salvatore Decent. Cornelius Moras, MD   ANESTHESIA:  General.   OPERATIVE NOTE IN DETAIL:  The patient was brought to the operating room  on the above-mentioned date and placed in a supine position on the  operating table.  General endotracheal anesthesia was induced  uneventfully under the care and direction of Dr. Adonis Huguenin.  A Foley  catheter was placed.  The patient's existing anterior wound VAC was  removed.  The patient's anterior chest was prepared and draped in a  sterile manner.  The patient's wound was explored.  There was evidence  of further fat necrosis in the inferior sternal wound, particularly on  the left side.  Fat necrosis extends down to involves the fascial  closure in the epigastric region.  There were 2 exposed sternal wires.  The fascial closure sutures and the sternal wires in this region are  both removed.  The upper portion of the body of the sternum and the  manubrium all remains entirely intact and the skin edges remained intact  above.  On palpation, there was no sign of fluctuance or tracking of the  wound in a cephalad direction.  There was no sign of extension into the  mediastinum.  The anterior table of the xiphoid process and the distal  portion of the sternum on the left side was debrided with rongeurs to  facilitate adequate packing in the region where the sternal wires had  been removed.  The wound was then irrigated with copious saline solution  using a pulse  irrigation lavage device.  Wound VAC was replaced into the  wound uneventfully.  The patient tolerated the procedure well, was  extubated in the operating room, and transported to the recovery room in  stable condition.  There were no intraoperative complications.      Salvatore Decent. Cornelius Moras, M.D.  Electronically Signed     CHO/MEDQ  D:  06/19/2009  T:  06/20/2009  Job:  [id number]

## 2011-04-19 NOTE — Discharge Summary (Signed)
NAMEPAULINE, [name]                ACCOUNT NO.:  [id number]   MEDICAL RECORD NO.:  [id number]          PATIENT TYPE:  INP   LOCATION:  2016                         FACILITY:  [facility]   PHYSICIAN:  Salvatore Decent. Cornelius Moras, M.D. DATE OF BIRTH:  1941-11-08   DATE OF ADMISSION:  06/06/2009  DATE OF DISCHARGE:                               DISCHARGE SUMMARY   FINAL DIAGNOSIS:  Sternal wound infection, status post coronary artery  bypass grafting.   SECONDARY DIAGNOSES:  1. Severe three-vessel coronary artery disease, status post coronary      artery bypass grafting x3 done by Dr. Cornelius Moras on May 28, 2009.  2. Hypertension.  3. Gastroparesis.  4. Gastroesophageal reflux disease with hiatal hernia.  5. Diverticulosis.  6. History of prior myocardial infarction.  7. Pacemaker placement for bradycardia.  8. Seizure disorder.  9. Status post thyroid surgery.  10.Status post back surgery.  11.Morbid obesity.   IN-HOSPITAL OPERATIONS AND PROCEDURES:  1. Sternal wound debridement with placement of vacuum-assisted closure      device on June 15, 2009.  2. Sternal wound debridement on June 19, 2009.  3. Sternal wound debridement on June 26, 2009.  4. Debridement of skin, subcutaneous tissue, and bone of the sternum      with radical rectus abdominis myocutaneous flap on the right on      June 29, 2009.   HISTORY AND PHYSICAL AND HOSPITAL COURSE:  [name] is a 70 year old  obese, nondiabetic female who underwent three-vessel coronary artery  bypass grafting by Dr. Cornelius Moras on May 28, 2009.  She had an uneventful  recovery and was discharged to home several days postoperatively.  The  patient was doing well at home until beginning of July when she  developed swelling, tenderness, and drainage of the stay with fever  noted in the lower aspect of the sternal incision.  She presented to the  emergency department for evaluation of sternal incision.  Dr. Donata Clay  was contacted and evaluated the patient  in the emergency room and felt  that the patient would require admission for further treatment of  sternal wound infection.  For further details of the patient's past  medical history and physical exam, please see dictated H and P.   The patient was admitted to [facility] on June 06, 2009, with  sternal wound infection.  She was started on IV vancomycin and p.o.  Avelox.  Cultures were taken and daily dressing changes were ordered.  Wound cultures came back showing moderate Klebsiella and Proteus as well  as positive for Staphylococcus aureus.  The patient was continued on IV  antibiotics as well as p.o. Avelox.  Dressing changes were continued.  Unfortunately, the patient continued to drain serosanguineous fluid and  exam showed the suggestion of an adequate drainage.  Dr. Cornelius Moras discussed  with the patient going to the operating room for sternal debridement.  He discussed risks and benefits with the patient.  The patient  acknowledged understanding and agreed to proceed.  She was taken to the  operating room on June 15, 2009, for sternal  wound debridement with  placement of vacuum-assisted closure device.  The patient tolerated this  procedure well and transferred back to telemetry floor.  Wound VAC was  ordered for change on Monday, Wednesday, and Friday.  Infectious Disease  was consulted.  They saw and evaluated the patient on June 17, 2009.  They recommended continuing Avelox for a total 6 weeks with having  Avelox 400 mg IV for 4 weeks and Avelox 100 mg p.o. for 2 weeks.  On  June 18, 2009, Dr. Cornelius Moras felt that the patient's sternal wound was not  improving.  He felt that she would benefit from taking her back to the  operating room for further debridement and possible removal of sternal  wires.  Risks and benefits again were discussed with the patient and she  agreed to proceed.  She was taken back to the operating room on June 19, 2009, where she underwent sternal wound  debridement.  The patient was  then transferred back to telemetry floor.  IV vancomycin had been  discontinued.  She was continued on Avelox.  The patient did have acute  blood loss anemia following this procedure and required 2 units of  packed red blood cells.  Hemoglobin/hematocrit improved appropriately  following transfusion.  Wound VAC continued to be changed on Monday,  Wednesday, and Friday.  Dr. Cornelius Moras evaluated the patient's wound on June 26, 2009, and felt that there was still no progression in the depth  perception.  It was clean, but felt to require further debridement and  again water removal at this time.  A question of possible placement of  flap.  He consulted Dr. Noelle Penner for further evaluation.  Dr. Noelle Penner  agreed that the patient would benefit from flap placement and would  proceed several days following Dr. Cornelius Moras removing the patient's sternal  wires.  The patient was again taken back to the operating room on June 26, 2009, where she underwent sternal wound debridement and removal of  sternal wires.  She tolerated this procedure well.  The patient has  remained stable over the week and then Dr. Noelle Penner took her back to the  operating room on June 29, 2009, where she underwent debridement of skin  with subcutaneous tissue and bone of the sternum with radical rectus  abdominis myocutaneous flap on the right.  The patient tolerated this  procedure and was transferred to the Intensive Care Unit in stable  condition.  While in the intensive care unit, the patient noted to have  some confusion postoperatively.  She was agitated and it was felt to be  secondary to anesthetic.  She was monitored closely and confusion  improved.  She did have mild increase in creatinine up to 2.2.  At that  time, her diuretics and Lotensin were put on hold.  Creatinine was  followed and was improving.  It dropped down to 1.5 on the July 02, 2009.  The patient was eventually restarted on her  Lotensin and Lasix.  She was transferred out to telemetry floor on July 03, 2009.  The  patient's incision site was clean, dry, and intact and healing well.  I  then closed with sutures and staples.  These remained intact.  The  patient does have one JP drain still in place and plan to leave this for  several more days.  Home Case Management has been consulted for  arrangement of home health nurse as well as home health PT.  Home health  nurse to manage JP drain as well as the patient to be discharged to home  on IV antibiotics.   During this time, the patient's vital signs were followed closely.  She  remained in normal sinus rhythm.  Blood pressure remained stable.  She  was up ambulating well with cardiac rehab.  She was tolerating diet well  and no nausea or vomiting noted.  She remained off oxygen with O2 sats  greater than 90% on room air.   On July 06, 2009, the patient's vital signs were stable.  Her most  recent lab work shows sodium of 131, potassium 3.9, chloride of 97,  bicarbonate 24, BUN of 13, creatinine 1.16, and glucose 119.  White  blood cell count 11.5, hemoglobin 9.7, hematocrit 29.0, and platelet  count 202.  The patient is ready for discharge home in the a.m. pending  she remained stable on July 07, 2009.   FOLLOWUP APPOINTMENT:  A followup appointment has been arranged with Dr.  Cornelius Moras for July 20, 2009, at 1:30 p.m.  The patient will need to obtain  PA and lateral chest x-ray 30 minutes prior to this appointment.  The  patient will need to follow up with Dr. Noelle Penner in 10 days.  She will  need to contact his office to make these arrangements.  She will need to  follow up Dr. Juanda Chance in 2 weeks.  She will need to contact his office to  make these arrangements.   ACTIVITY:  The patient is instructed no driving until released to do so,  no heavy lifting over 10 pounds.  She is told to ambulate 3-4 times per  day, progress as tolerated and continue her breathing  exercises.   INCISIONAL CARE:  The patient is told she can wash her incisions using  soap and water.  She is to contact the office if she develops any  drainage or opening from any of her incision sites.   DISCHARGE MEDICATIONS:  1. Lopressor 25 mg b.i.d.  2. Dilantin 200 mg at night.  3. Atenolol 1 drop each eye at night.  4. Relafen 500 mg daily.  5. Neurontin 100 mg daily.  6. Reglan 5 mg with milk and at bedtime.  7. Nexium 40 mg daily.  8. Simvastatin 40 mg daily.  9. Xanax 2 mg t.i.d.  10.Ambien 10 mg at night.  11.Benazepril 20 mg daily.  12.Ogen 0.625 mg daily.  13.Avelox 400 mg daily x2 weeks.  14.Ferrous sulfate 325 mg b.i.d.  15.Norvasc 10 mg daily.  16.Lasix 40 mg daily.  17.Potassium chloride 20 mEq daily.  18.Oxycodone 5 mg 1-2 tablets q.4-6 hours p.r.n. pain.      Sol Blazing, PA      Salvatore Decent. Cornelius Moras, M.D.  Electronically Signed    KMD/MEDQ  D:  07/06/2009  T:  07/07/2009  Job:  [id number]   cc:   Loreta Ave, MD  Everardo Beals. Juanda Chance, MD, [facility]

## 2011-04-19 NOTE — Discharge Summary (Signed)
[name], [name]                ACCOUNT NO.:  [id number]   MEDICAL RECORD NO.:  [id number]          PATIENT TYPE:  INP   LOCATION:  2018                         FACILITY:  [facility]   PHYSICIAN:  Salvatore Decent. Cornelius Moras, M.D. DATE OF BIRTH:  1941/09/11   DATE OF ADMISSION:  05/28/2009  DATE OF DISCHARGE:                               DISCHARGE SUMMARY   FINAL DIAGNOSES:  1. Severe three-vessel coronary artery disease.   IN-HOSPITAL DIAGNOSES:  1. Acute blood loss anemia postoperatively.  2. Volume overload postoperatively.   SECONDARY DIAGNOSES:  1. Symptomatic bradycardia and complete heart block, status post      permanent pacemaker placement.  2. Status post generator change for battery end of life.  3. Hypertension.  4. Morbid obesity.  5. Degenerative arthritis with limited physical mobility.  6. Hyperlipidemia.  7. Gastroesophageal reflux disease.  8. Seizure disorder.  9. Status post right knee replacement.  10.Status post left knee replacement.  11.Status post right total hip replacement.  12.Status post lower back surgery.  13.Status post partial thyroidectomy.  14.Status post hysterectomy.   IN-HOSPITAL OPERATIONS AND PROCEDURES:  1. Coronary artery bypass grafting x3 using left internal mammary      artery to distal left anterior descending coronary artery,      saphenous vein graft to first circumflex marginal branch, saphenous      vein graft to posterior descending coronary artery.  Endoscopic      saphenous vein harvest from right thigh was done.   HISTORY AND PHYSICAL AND HOSPITAL COURSE:  The patient is a 70 year old  obese [place] American female from [place], [place] with long-  standing history of ischemic heart disease and complete heart block.  The patient now presents with recent onset of substernal chest pain at  rest consistent with angina pectoris.  Followup cardiac catheterization  done by Dr. Edwyna Shell demonstrates severe three-vessel coronary  artery  disease, mild left ventricular dysfunction.  The patient was seen and  evaluated by Dr. Cornelius Moras.  Dr. Cornelius Moras discussed with the patient undergoing  coronary artery bypass grafting.  He discussed risks and benefits with  the patient.  The patient voiced understanding, agreed to proceed.  Surgery was scheduled for May 28, 2009.  For further details of the  patient's past medical history and physical exam, please see dictated  H&P.   The patient was taken to the operating room on May 28, 2009, where she  underwent coronary artery bypass grafting x3, using a left internal  mammary artery to distal left anterior descending coronary artery,  saphenous vein graft to first circumflex marginal branch, saphenous vein  graft to posterior descending coronary artery.  Endoscopic saphenous  vein harvest from right thigh was done.  The patient tolerated this  procedure well and transferred to the intensive care unit in stable  condition.  Postoperatively, the patient was noted to be hemodynamically  stable.  She was extubated in the evening of surgery.  Post extubation,  the patient noted to be alert and oriented x4, neuro intact.  Postoperatively, the patient was noted to be in normal sinus  rhythm.  Blood pressure was stable.  All drips were able to be weaned and  discontinued.  The patient was started on low-dose beta-blocker.  She  was also restarted on her ACE inhibitor.  Vital signs continued to be  monitored.  The patient remained in normal sinus rhythm and blood  pressure remained controlled.  External pacing wires were able to be  discontinued without difficulty.  Postoperatively, chest x-rays  obtained.  Postop day #1, the patient had small bilateral pleural  effusions with atelectasis.  She had minimal drainage from chest tubes  and chest tubes were discontinued in normal fashion.  Followup chest x-  ray following day remained stable and improving.  The patient was  encouraged to use  her incentive spirometer.  She was able to be weaned  off oxygen sating greater than 90% on room air.  Postoperatively, the  patient did have some mild acute blood loss anemia.  Her  hemoglobin/hematocrit on day #1 was 9.1, 27.5.  This remained stable.  The patient did not receive any blood products postoperatively.  She did  have some mild volume overload and was started on diuretics.  Daily  weights were followed.  She is currently 3 kg above her preoperative  weight.  Postoperatively, the patient was progressing extremely well,  was transferred out to PCTU postop day #1.  The patient was up  ambulating well with cardiac rehab.  She was tolerating diet well.  No  nausea, vomiting noted.  All incisions were clean, dry, intact, and  healing well.   Postop day #3.  The patient is afebrile.  She is in normal sinus rhythm.  Blood pressure stable.  Her most recent lab work shows sodium of 135,  potassium 4.4, chloride of 103, bicarbonate 28, BUN of 13, creatinine  0.94, glucose 140.  White blood cell count of 11.8, hemoglobin of 9.1,  hematocrit 27.5, platelet count 134.  The patient is progressing  extremely well and is felt to be ready for discharge to home in the next  24-48 hours if she remained stable.   FOLLOWUP APPOINTMENTS:  Followup appointment will be arranged with Dr.  Cornelius Moras in 3 weeks.  The patient will need to obtain PA and lateral chest x-  ray 30 minutes prior to appointment.  The patient will need to follow up  with Dr. Juanda Chance in 2 weeks.  She will need to contact his office to  schedule this appointment.   ACTIVITY:  The patient instructed no driving until released to do so, no  heavy lifting over 10 pounds.  She is told to ambulate 3-4 times per  day.  Progress as tolerated and continue her breathing exercises.   INCISIONAL CARE:  The patient was told to shower, washing her incisions  using soap and water.  She is contact the office if she develops any  drainage or  opening from any of her incision sites.   DIET:  The patient is educated on diet to be low fat, low salt.   DISCHARGE MEDICATIONS:  1. Patanol 0.1% one drop each eye at night.  2. Relafen 500 mg daily.  3. Gabapentin 100 mg t.i.d.  4. Dilantin 200 mg at night.  5. Darvocet-N 100, 1-2 tablets q.4-6 h p.r.n. pain.  6. Protonix 40 mg daily.  7. Metoclopramide 5 mg before meals and at night.  8. Nexium 40 mg daily.  9. Simvastatin 40 mg daily.  10.Xanax 2 mg t.i.d.  11.Ambien 10 mg at night.  12.Lasix 40 mg daily x5 days.  13.Potassium chloride 20 mEq daily x5 days.  14.Lopressor 25 mg b.i.d.  15.Benazepril 20 mg daily.  16.Ortho-Est 0.625 mg daily.      Sol Blazing, PA      Salvatore Decent. Cornelius Moras, M.D.  Electronically Signed    KMD/MEDQ  D:  05/31/2009  T:  05/31/2009  Job:  [id number]   cc:   Salvatore Decent. Cornelius Moras, M.D.  Bruce Elvera Lennox Juanda Chance, MD, [facility]

## 2011-04-19 NOTE — Discharge Summary (Signed)
[name], [name]                ACCOUNT NO.:  [id number]   MEDICAL RECORD NO.:  [id number]          PATIENT TYPE:  OBV   LOCATION:  A212                          FACILITY:  [facility]   PHYSICIAN:  Edward L. Juanetta Gosling, M.D.DATE OF BIRTH:  12-Jul-1941   DATE OF ADMISSION:  12/17/2007  DATE OF DISCHARGE:  LH                               DISCHARGE SUMMARY   FINAL DISCHARGE DIAGNOSES:  1. Chest pain, probably esophageal in origin, myocardial infarction      ruled out.  2. Hypertension.  3. Hyperlipidemia.  4. History of irregular heart beat status post pacemaker.  5. Seizure disorder.  6. Gastroparesis.  7. Gastroesophageal reflux disease.  8. Status post multiple orthopedic surgeries.  9. History of partial thyroidectomy.   [name] is a 70 year old who came to the emergency room because of  chest discomfort.  She was eating a piece of sausage on the 10th and had  what seemed to be a food impaction.  She had nausea and vomiting.  She  eventually seemed to clear to the food impaction but she has been having  some chest discomfort since.  To some extent, it seems to be worse when  she swallows.  It is substernal and does not radiate.  She had a  positive D-dimer in the emergency room and has a low probability of  ventilation perfusion lung scan.  Blood pressure 120/80.  Pulse 80.  Respirations 16.  She was afebrile.  Her chest was clear without  wheezes, rales, or rhonchi.  Her heart was regular.  Abdomen soft with  no masses.  Extremities showed no edema.   LABORATORY WORK:  She ruled out for myocardial infarction and had  consultation with Dr. Cira Servant of the GI team and had a barium study to  make sure that she did not have a ruptured esophagus and she did not.  Her symptoms totally resolved and she was discharged home for followup  in my office and followup with the GI team.   She is going to be on her regular medications which include:  1. Promethazine 25 mg as needed.  2. Zegerid  40 mg daily.  3. Darvocet-N 100 as needed.  4. Xanax 0.5 three times a day as needed.  5. Ambien 10 mg at bedtime as needed.  6. MiraLax 1 cap daily.  7. Simvastatin 40 mg daily.  8. Estropipate 0.625 daily.  9. She is going to held Relafen.  She has been taking 500 mg daily.  10.Amlodipine benazepril 5/20 daily.  11.Metoclopramide 4 times a day as needed.  12.Dilantin as needed.   She is going to advance her diet slowly and I will see if we can get her  appointment set up for followup with the GI team.      Oneal Deputy. Juanetta Gosling, M.D.  Electronically Signed     ELH/MEDQ  D:  12/19/2007  T:  12/19/2007  Job:  [id number]

## 2011-04-21 ENCOUNTER — Ambulatory Visit: Payer: PRIVATE HEALTH INSURANCE | Admitting: Internal Medicine

## 2011-04-21 ENCOUNTER — Telehealth: Payer: Self-pay | Admitting: Internal Medicine

## 2011-04-21 NOTE — Telephone Encounter (Signed)
PM site strips is causing her discomfort.  Please call and let her know if there is something she can do.

## 2011-04-21 NOTE — Telephone Encounter (Signed)
Patient was concerned because steri strips had not come off completely.  We will see her for her wound check 5/30 at remove them @ that time.

## 2011-04-22 NOTE — Op Note (Signed)
NAME:  [name], [name]                ACCOUNT NO.:  [id number]   MEDICAL RECORD NO.:  [id number]          PATIENT TYPE:  AMB   LOCATION:  DAY                           FACILITY:  [facility]   PHYSICIAN:  R. Roetta Sessions, M.D. DATE OF BIRTH:  June 03, 1941   DATE OF PROCEDURE:  04/26/2006  DATE OF DISCHARGE:                                  PROCEDURE NOTE   PROCEDURE:  Diagnostic esophagogastroduodenoscopy.   ENDOSCOPIST:  Jonathon Bellows, M.D.   INDICATIONS FOR PROCEDURE:  The patient is a 70 year old lady with anemia  and Hemoccult-positive stool.  Recent colonoscopy demonstrated internal  hemorrhoids and sigmoid diverticula; this was a little less than 1 year ago.  She has lost 100 pounds over the last 8-9 months without trying.  She does  take ibuprofen regularly for osteoarthritis.  History of H. pylori  previously treated with triple __________  therapy (based on CLOtesting).  EGD is now being done.  She does not have any reflux symptoms or dysphagia  to food.  This approach has been discussed with the patient at length and  potential risks, benefits and alternatives have been reviewed and questions  answered.   PROCEDURAL NOTE:  O2 saturation, blood pressure, pulse and respirations were  monitored throughout entire procedure.   CONSCIOUS SEDATION:  Versed 3 mg IV and Demerol 75 mg IV in divided doses,  Cetacaine spray for topical oropharyngeal anesthesia.   INSTRUMENT:  Olympus video chip system.   FINDINGS:   ESOPHAGUS:  Examination of the tubular esophagus revealed a noncritical  Schatzki's ring.  The esophageal mucosa otherwise appeared normal.  EG  junction was easily traversed.   STOMACH:  Gastric cavity was empty and insufflated well with air.  A  thorough examination of the gastric mucosa including a retroflexed view of  the proximal stomach and esophagogastric junction demonstrated a small  hiatal hernia and 2 benign-appearing stellate ulcers on the distal greater  curvature; please see photos.  Margins were friable.  The ulcer bases,  however, were clean.  They appeared to be benign lesions, see photos.  Pylorus was patent and easily traversed.  Examination of the bulb and second  portion revealed no abnormalities.   THERAPEUTIC/DIAGNOSTIC MANEUVERS PERFORMED:  None.   The patient tolerated the procedure well and was reacted at endoscopy.   IMPRESSION:  1.  Noncritical Schatzki's ring, otherwise normal esophagus, esophageal ring      not manipulated, small hiatal hernia.  2.  Stellate-shaped ulcers, distal greater curvature, with some friability,      clean base, no therapeutic intervention employed (likely the cause of      anemia and Hemoccult-positive stool).  Otherwise normal stomach, patent      pylorus, normal first duodenum and second duodenum.   RECOMMENDATIONS:  1.  Refrain from taking any forms of ibuprofen, NSAIDs or aspirin for now.      Begin Zegerid 40 mg orally daily.  2.  Serum TSH and H. pylori stool antigen testing today (to document      eradication of prior infection).  She was given Anusol suppositories for  her friable hemorrhoids in the office recently; however, she said she      could not get the suppositories up in her rectum and would like some      cream.  Therefore, we will give her some Anusol-HC cream to apply to the      anorectum 4 times daily.  3.  I will plan to see this nice lady back in the office in 3 months.  She      will need a repeat CBC at that time and I will make plans to go back and      look at her stomach once again.      Jonathon Bellows, M.D.  Electronically Signed     RMR/MEDQ  D:  04/26/2006  T:  04/26/2006  Job:  [id number]   cc:   Ramon Dredge L. Juanetta Gosling, M.D.  Fax: [phone]   Langley Gauss, MD  Fax: [phone]

## 2011-04-22 NOTE — Op Note (Signed)
NAME:  [name], [name]                          ACCOUNT NO.:  [id number]   MEDICAL RECORD NO.:  [id number]                   PATIENT TYPE:  OIB   LOCATION:  2853                                 FACILITY:  [facility]   PHYSICIAN:  Charlies Constable, M.D. 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  She recently came in for a  pacemaker check and was found to be at end of life with drop in her magnet  rate, in which she was brought in for generator replacement.  The patient  was not pacing dependent.   PROCEDURE:  Explantation of the old Paragon II DV generator and inspection  of the old atrial lead, Daig Model [id number], Serial [phone] on the atrial side  and pacemaker Model 1216T, Serial [id number] ventricular lead, with  implantation of a new Medtronic Kappa DV generator (Model [id number], serial  number [id number]).   INDICATIONS:  General end of life.   ANESTHESIA:  One percent local Xylocaine.   ESTIMATED BLOOD LOSS:  Less than 10 cc.   COMPLICATIONS:  None.   DESCRIPTION OF PROCEDURE:  The procedure was informed and done in Lab #7.  The right anterior chest was prepped and draped in the usual fashion.  The  skin and subcutaneous tissue were anesthetized with 1% local Xylocaine.  An  incision was made just below the old incision and extended to the pocket.  The pocket was opened, and the generator was removed.  The leads were  inspected and tested with good pacing parameters as described below.  The  leads were attached to the new generator.  The generator was implanted into  the pocket and secured loosely to the posterior aspect of the pocket with 1-  0 silk.  The pocket had been previously irrigated with sterile Kanamycin  solution.  The subcutaneous tissue was  then closed with running 2-0 Dexon.  The skin was closed with running 5-0 Dexon.   Pacing parameters:  Atrial unipolar:  P wave of 5.1 mV, minimum threshold  capture 1.1 volts, resistant 475 ohms.  Atrial bipolar:  T wave 5.2 mV,  minimum threshold capture 1.2 volts, resistance 570 ohms.   Ventricular unipolar:  R wave 16.5 mV, threshold 1.0 volts, resistant 463  ohms.  Ventricular bipolar R wave 23 mV, minimum threshold capture 1.5  volts, resistant 654 ohms.   There was no pacing of the diaphragm on either lead at 10 volts.   The patient tolerated the procedure well and left the lab in satisfactory  condition.  Charlies Constable, M.D. [facility]    BB/MEDQ  D:  11/01/2002  T:  11/01/2002  Job:  [id number]   cc:   Ramon Dredge L. Juanetta Gosling, M.D.  [address]  [place]  [place] [address]  Fax: [phone]   Cardiopulmonary Lab

## 2011-04-22 NOTE — Op Note (Signed)
NAME:  [name], [name]                          ACCOUNT NO.:  [id number]   MEDICAL RECORD NO.:  [id number]                   PATIENT TYPE:  AMB   LOCATION:  DSC                                  FACILITY:  [facility]   PHYSICIAN:  Matthew A. Mina Marble, M.D.           DATE OF BIRTH:  [birth year]   DATE OF PROCEDURE:  12/10/2003  DATE OF DISCHARGE:                                 OPERATIVE REPORT   PREOPERATIVE DIAGNOSES:  1. Left carpal tunnel syndrome.  2. Left elbow antecubital mass.   POSTOPERATIVE DIAGNOSES:  1. Left carpal tunnel syndrome.  2. Left elbow antecubital mass.   PROCEDURE:  1. Left carpal tunnel release.  2. Excisional biopsy of mass, left antecubital fossa, medial aspect.   SURGEON:  Artist Pais. Mina Marble, M.D.   ASSISTANT:  None.   ANESTHESIA:  General.   TOURNIQUET TIME:  35 minutes.   COMPLICATIONS:  None.   DRAINS:  None.   DESCRIPTION OF PROCEDURE:  The patient was taken to the operating room.  After the induction of adequate general anesthesia, the left upper extremity  was prepped and draped in the usual sterile fashion.  An Esmarch was used to  exsanguinate the limb.  Tourniquet was inflated to 250 mmHg.  At this point  in time, a 2 cm incision was made in the palmar aspect of the left hand in  line with the long finger metacarpal, starting at Kaplan's cardinal line.  The incision was taken down through the skin and subcutaneous tissue until  the palmar fascia was identified.  The palmar fascia was split, thus  exposing the distal edge of the transverse carpal ligament and the  superficial palmar arch.  The superficial palmar arch was retracted  distally, and #15 blade was used to incise the distal aspect of the  transverse carpal ligament.  The median nerve was identified and retracted  with the Therapist, nutritional.  The remaining aspects of the transverse carpal  ligament were divided under direct vision using a curved blunt scissors.  Canal was inspected.   The interosseus lesion is present.  This wound was  thoroughly irrigated and loosely closed with running 3-0 Prolene  subcuticular stitch. The second incision was made transversely in the medial  aspect of the antecubital fossa where prior mass had been excised with  recurrence 5 cm in length.  Incision was taken down to  skin and  subcutaneous tissue. 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  Hemostasis was achieved with bipolar cautery, and this  wound was then loosely closed with 4-0 Vicryl and 3-0 Prolene subcuticular  stitch.  Steri-Strips and 4 x 4 fluffs and compressive dressing applied to  both the elbow and the hand.  The patient tolerated the procedure well and  went to recovery room in stable fashion.  Artist Pais Mina Marble, M.D.    MAW/MEDQ  D:  12/10/2003  T:  12/10/2003  Job:  [id number]

## 2011-04-22 NOTE — Consult Note (Signed)
NAMEWINDI, TORO                ACCOUNT NO.:  [id number]   MEDICAL RECORD NO.:  [id number]           PATIENT TYPE:   LOCATION:                                 FACILITY:   PHYSICIAN:  R. Roetta Sessions, M.D. DATE OF BIRTH:  18-Mar-1941   DATE OF CONSULTATION:  04/12/2006  DATE OF DISCHARGE:                                   CONSULTATION   PRIMARY CARE PHYSICIAN:  Oneal Deputy. Juanetta Gosling, M.D.   REFERRING PHYSICIAN:  Langley Gauss, MD   REASON FOR CONSULTATION:  Positive guaiac.   HISTORY OF PRESENT ILLNESS:  Ms. Flagler is a 70 year old female who denies  any history of iron-deficiency anemia.  She did have a hysterectomy back in  the 1970s for dysfunctional uterine bleeding.  She has a history of chronic  GERD.  She was found to be guaiac positive on exam by Dr. Lisette Grinder recently.  She had been taking ibuprofen 200 mg t.i.d. up until about 3 months ago.  However, she states that her stool has been guaiac since this time.  She has  a history of osteoarthritis.  She is now on Darvocet.  She has a history of  gastroparesis.  She takes Reglan 5 mg a.c. and h.s.  She denies any rectal  bleeding, melena, abdominal pain, nausea, vomiting, anorexia, fatigue, chest  pain, or palpitations.  She had colonoscopy which showed internal  hemorrhoids and sigmoid diverticula on Apr 27, 2005.  Her weight has dropped  100 pounds over the last 8 or 9 months.  Over the last 3 months she states  that she has been able to gain back about 18 of those pounds.  She denies  any heartburn, indigestion, dysphagia, odynophagia or early satiety.   CURRENT MEDICATIONS:  1.  Propiomazine/APAP 100/650 mg q.i.d. p.r.n.  2.  Dilantin 100 mg 3 q.h.s.  3.  Oxaprozin 600 mg 2 p.o. daily.  4.  Estropipate 0.625 mg daily.  5.  Temazepam 15 mg q.h.s.  6.  Alprazolam 0.5 mg t.i.d.  7.  Lotrel 5/20 mg daily.  8.  Metoclopramide, she believes that this is 5 mg before meals and at      bedtime, although unsure of the dose.  9.  Phillip's milk of magnesia p.r.n.   ALLERGIES:  CODEINE.   PAST MEDICAL HISTORY:  1.  Hypertension.  2.  Pacemaker.  3.  History of seizure disorder well controlled.  4.  History of chronic GERD with esophageal dysphagia with a Schatzki ring      requiring dilatation in the past, last in 1998.  5.  She has a history of oral pharyngeal dysphagia requiring speech therapy,      but denies any current dysphagia.  6.  She has a history of colonoscopy Apr 27, 2005 by Dr. Jena Gauss which showed      internal hemorrhoids and sigmoid diverticula.   FAMILY HISTORY:  Mother deceased secondary to MI at age 70.  Father with  bladder carcinoma, deceased at age 93.  There is no know family history of  colonic carcinoma or chronic GI problems.  SOCIAL HISTORY:  Ms. Sofranko is married.  She has 1 son and 4 daughters who  are healthy.  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  She  denied any tobacco, alcohol, or drug use.   REVIEW OF SYSTEMS:  CONSTITUTIONAL:  See HPI.  Denies any fever, or chills.  CARDIOVASCULAR:  Denies any history of __________ cough or hemoptysis.  GASTROINTESTINAL:  See HPI.   PHYSICAL EXAMINATION:  VITAL SIGNS:  Weight 178 pounds.  Height 62 inches.  Temperature 98.2, blood pressure 110/68, pulse 84.  GENERAL:  Ms. Diehl is a 70 year old African-American female who is alert,  oriented, pleasant, and cooperative in no acute distress.  HEENT:  Sclerae are clear.  Nonicteric.  Conjunctivae pink. Oropharynx pink  and moist without any lesions.  NECK:  Supple without any masses or thyromegaly.  CHEST:  Heart regular rate and rhythm with normal S1-S2, without murmurs,  clicks, rubs or gallops.  ABDOMEN:  Positive bowel sounds x4.  No bruits auscultated.  Soft,  nontender, nondistended without palpable mass or organomegaly.  Rule out  tenderness or guarding.  EXTREMITIES:  Without clubbing.  She has trace bilateral lower extremity  edema.  RECTAL:  She does have  a large, protruding, external hemorrhoid that is  erythematous and stigmata of recent bleeding.   IMPRESSION:  Ms. Castelluccio is a 70 year old African-American female with a  history of gastroparesis.  Not mentioned above, she was CLOtest positive and  is status post triple drug therapy in 1998 with her most recent  esophagogastroduodenoscopy.  She is found to have Hemoccult-positive stool  through Dr. Preston Fleeting office.  She also has hemorrhoidal disease.  Her si  situation is also complicated by the fact that she has active hemorrhoidal  disease on exam today.   In reviewing her records, she has had a significant weight loss of 100  pounds, but this has not been within the last year.  This happened some time  more than a year ago.  Her weight has been stable for the last year,  however.  She had been using a significant amount of ibuprofen for her  osteoarthritis, and I suspect that she may have had NSAID-induced injury to  her GI tract or the Hemoccult-positive stool could have been coming from her  hemorrhoidal disease.  Nonetheless, given the fact that she has experienced  a significant amount of weight loss in the last 2 years, and given her  history of gastroparesis, and use of NSAIDS previously, I feel we should  proceed with EGD to be sure that we are not dealing with silent peptic ulcer  disease.   PLAN:  1.  Anusol HC suppositories 1 per rectum b.i.d. #20 with no refills.  2.  Will plan for EGD by Dr. Jena Gauss in the near future.  I discussed both      procedures including risks and benefits, including but not limited to      bleeding, infection, perforation, she agrees, and consent will be      obtained.   We would like to thank Dr. Lisette Grinder for allowing [place] to participate in the  care of Ms. Salinger.      Nicholas Lose, N.P.      Jonathon Bellows, M.D.  Electronically Signed   KC/MEDQ  D:  04/12/2006  T:  04/12/2006  Job:  [id number]   cc:   Ramon Dredge L. Juanetta Gosling, M.D.   Fax: [phone]   Langley Gauss, MD  Fax:  [phone] 

## 2011-04-22 NOTE — Op Note (Signed)
NAME:  [name], [name]                ACCOUNT NO.:  [id number]   MEDICAL RECORD NO.:  [id number]          PATIENT TYPE:  AMB   LOCATION:  DAY                           FACILITY:  [facility]   PHYSICIAN:  R. Roetta Sessions, M.D. DATE OF BIRTH:  [birth year]   DATE OF PROCEDURE:  10/06/2006  DATE OF DISCHARGE:                                 OPERATIVE REPORT   PROCEDURE:  Esophagogastroduodenoscopy.   INDICATIONS FOR PROCEDURE:  The patient is a 70 year old lady who was found  have a gastric ulcer back in May. She has been on Coumadin. She has been  treated with proton pump inhibitor therapy.  She has been taking Relafen, as  well.  She stopped her Coumadin two weeks under the direction of Dr. Juanetta Gosling  per her report. She continues on the Relafen but it is good to know she is  also on Zegerid 40 mg orally daily.  EGD is now being done to document ulcer  healing.  This approach has been discussed with the patient at length.  The  potential risks, benefits, and alternatives have been reviewed, questions  answered, she is agreeable.  Please see documentation in the medical record.   PROCEDURE NOTE:  O2 saturation, blood pressure, pulse, and respirations were  monitored throughout the entire procedure.  Conscious sedation with IV  Versed and Demerol in incremental doses.  Cetacaine spray for topical  pharyngeal anesthesia.   FINDINGS:  Examination of the tubular esophagus revealed no mucosal  abnormalities.  The EG junction was easily traversed.  Stomach:  Gastric cavity was empty and insufflated well with air.  A  thorough examination of the gastric mucosa and retroflexion proximal  stomach.  This esophagogastric junction demonstrated no abnormalities aside  from a small hiatal hernia.  The patient actually did have a noncritical  ring that was not manipulated.  The gastric mucosa was well seen and  appeared normal, the previously noted ulcer was gone.  The pylorus was  patent, easily traversed.   Examination of the bulb and second portion  revealed no abnormalities.   THERAPEUTIC/DIAGNOSTIC MANEUVERS PERFORMED:  None.   The patient tolerated procedure well and was reacted in endoscopy.   IMPRESSION:  Noncritical Schatzki's ring, otherwise, normal esophagus.  Small hiatal hernia, otherwise, normal stomach. Normal D1 and D2.   RECOMMENDATIONS:  1. Continue Zegerid 40 mg orally daily as long as she is going to continue      taking nonsteroidals.  2  Warning signs reviewed with the patient.  1. She is to follow up with Dr. Juanetta Gosling.      Jonathon Bellows, M.D.  Electronically Signed     RMR/MEDQ  D:  10/06/2006  T:  10/06/2006  Job:  [id number]   cc:   Ramon Dredge L. Juanetta Gosling, M.D.  Fax: [phone]

## 2011-04-22 NOTE — Consult Note (Signed)
NAME:  [name], [name]                ACCOUNT NO.:  [id number]   MEDICAL RECORD NO.:  [id number]           PATIENT TYPE:  AMB   LOCATION:                                 FACILITY:   PHYSICIAN:  Lionel December, M.D.    DATE OF BIRTH:  [birth year]   DATE OF CONSULTATION:  DATE OF DISCHARGE:                                   CONSULTATION   CHIEF COMPLAINT:  Change in bowel habits, constipation.   HISTORY OF PRESENT ILLNESS:  The patient is a 70 year old Black female who  presents today at the request of Dr. Juanetta Gosling for further evaluation and  change in bowel movements. For the past few months, she has been having  problems with constipation. She feels the urge to have a bowel movement but  has to strain significantly to get the stool to expel. Often, she has to  take a laxative in order to have a bowel movement. She generally takes  Vear Clock soft chew laxatives every other night and has 1 solid bowel  movement by the following morning. Denies any hematochezia, melena,  abdominal pain, nausea, vomiting, heartburn or difficulty swallowing. She  has never had a complete colonoscopy. Previously, she had a flexible  sigmoidoscopy in 1998 for screening purposes. This was normal to 60 cm.   CURRENT MEDICATIONS:  Darvocet with Tylenol 100/650 mg 1 q.i.d. p.r.n.,  Dilantin 100 mg 3 tablets at night, Oxaprozin 600 mg 2 tablets a day,  Estropipate 0.625 mg q.d., Temazepam 15 mg at bedtime, Alprazolam 0.5 mg  t.i.d., Ibuprofen 600 mg t.i.d., Lotrel 5/20 mg q.d., Metoclopramide 1  before meals and at bedtime, Phillips soft chew laxative p.r.n. usually  every other night.   ALLERGIES:  CODEINE.   PAST MEDICAL HISTORY:  Hypertension, pacemaker for arrhythmia, history of  seizures, hiatal hernia. History of esophageal dysphagia with Schatzki's  ring, requiring dilatation in the past. In 1998, she had disruption of the  ring with cold biopsy forceps.  She also has a history of oral pharyngeal  dysphagia, requiring speech therapy but denies any current ongoing problems.  She has hypertension.   PAST SURGICAL HISTORY:  A complete hysterectomy, cholecystectomy. She has  had bilateral knee replacements both more than a year ago. A right hip  replacement more than a year ago. Partial thyroidectomy. Back surgery twice.   FAMILY HISTORY:  Mother died of myocardial infarction at 47. Father had  bladder cancer and died at age 86. No family history of colorectal cancer or  chronic gastrointestinal illnesses.   SOCIAL HISTORY:  She is married and has 1 son and 4 daughters. 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 She has never  been a smoker. Denies any alcohol use.   REVIEW OF SYSTEMS:  GASTROINTESTINAL:  See HPI.  CONSTITUTIONAL:  Denies any  weight loss. CARDIOPULMONARY:  Denies any chest pain or shortness of breath.   PHYSICAL EXAMINATION:  VITAL SIGNS:  Weight 172.5. Height 5 foot 2.  Temperature 98.1, blood pressure 154/88, pulse of 84.  GENERAL:  A pleasant, well developed,  well nourished, elderly black female  in no acute distress.  SKIN:  Warm and dry. No jaundice.  HEENT:  Conjunctivae are pale. Sclerae nonicteric. Oropharyngeal mucosa  moist and pink. No lesions, erythema, or exudate. No lymphadenopathy or  thyromegaly.  CHEST:  Lungs clear to auscultation.  CARDIAC:  Regular rate and rhythm. Normal S1 and S2. No murmur, rub, or  gallop.  ABDOMEN:  Positive bowel sounds. Obese, symmetrical and soft. Nontender. No  organomegaly or masses. No rebound tenderness or guarding. No abdominal  bruits or hernias.  RECTAL:  Examination reveals moderate size external hemorrhoid with no  evidence of thrombosis or bleeding. Normal sphincter tone. No masses in the  rectal vault. Secretions are Hemoccult negative.  EXTREMITIES:  No edema.   IMPRESSION:  [name] is a 70 year old lady with several month history of  change in her bowel movements. She  now has significant constipation and has  to strain with defecation. There is no abnormalities specifically noted on  rectal examination, although on Valsalva maneuver, there is not much change  noted. She has never had a colonoscopy and we discussed this as an option  today and she is agreeable. I discussed risks, alternatives, and benefits  with regards to bleeding, infection, reaction to medications, perforation.  Signed consent will be obtained on the day of procedure.   PLAN:  1.  Colonoscopy in the near future by Dr. Jena Gauss. Today's consultation note      to be co-signed by Dr. Karilyn Cota in his absence.  2.  Will go ahead and provide her with SBE prophylaxis, given that she      states that she needs antibiotics prior to dental procedures.  3.  Will check a CBC at the time of endoscopy to evaluate for anemia, given      physical examination as outlined above.  4.  MiraLax 17 grams p.o. q.d. p.r.n. constipation, 527 grams and 3 refills      given.      LL/MEDQ  D:  04/22/2005  T:  04/23/2005  Job:  [id number]   cc:   Lionel December, M.D.  [address]  [place]  [place] [address]   Ramon Dredge L. Juanetta Gosling, M.D.  [address]  [place]  [place] [address]  Fax: [phone]

## 2011-04-22 NOTE — Op Note (Signed)
NAME:  [name], [name]                ACCOUNT NO.:  [id number]   MEDICAL RECORD NO.:  [id number]          PATIENT TYPE:  AMB   LOCATION:  DAY                           FACILITY:  [facility]   PHYSICIAN:  R. Roetta Sessions, M.D. DATE OF BIRTH:  1941-03-18   DATE OF PROCEDURE:  04/27/2005  DATE OF DISCHARGE:                                 OPERATIVE REPORT   PROCEDURE PERFORMED:  Diagnostic colonoscopy.   INDICATIONS FOR PROCEDURE:  The patient is a  70 year old lady with recent  change in her bowel habits, predominantly constipated.  She has have had  colonoscopy.  Colonoscopy is now being done.  This approach has been discussed with the  patient at length.  Potential risks, benefits and alternatives have been  reviewed, questions have been answered.  It is notable that she has received  subacute bacterial endocarditis prophylaxis previously for endoscopic  procedures; however, at this time there is no apparent indication for SBE  prophylaxis after investigating her history. Therefore, none will be given.  Please see documentation in the medical records.   PROCEDURE NOTE:  Oxygen saturations, blood pressure, pulse and respirations  were monitored throughout the entire procedure.  Conscious sedation Versed 4  mg IV, Demerol 75 mg IV in divided doses.   INSTRUMENT USED:  Olympus video chip system.   FINDINGS:  Digital rectal exam revealed no abnormalities.   ENDOSCOPIC FINDINGS:  Prep was good.   Rectum:  Examination of the rectal mucosa including retroflex view of the  anal verge revealed only internal hemorrhoids.  Colon:  Colonic mucosa was surveyed from the rectosigmoid junction to the  left, transverse and right colon to the area of the appendiceal orifice and  ileocecal valve and cecum.  These structures were well seen and photographed  for the record.  From this level, the scope was slowly withdrawn.  All  previously mentioned mucosal surfaces were again seen.  The patient was  noted to have left-sided diverticula.  The remainder of the colonic mucosa  appeared normal.  The patient tolerated the procedure well, was reacted in  endoscopy.   IMPRESSION:  1.  Internal hemorrhoids, otherwise normal rectum.  2.  Sigmoid diverticula.  Remainder of colonic mucosa appeared normal.   RECOMMENDATIONS:  1.  Constipation/diverticulosis literature provided Ms. Apachito.  2.  Continue MiraLax 17 g orally at bedtime as needed for constipation which      was just started through our office.  3.  Daily Metamucil or Citrucel fiber supplement.  4.  Considere repeat colonoscopy for CRC screening purposes in 10 years.      RMR/MEDQ  D:  04/27/2005  T:  04/27/2005  Job:  [id number]   cc:   Ramon Dredge L. Juanetta Gosling, M.D.  [address]  [place]  [place] [address]  Fax: [phone]

## 2011-04-27 ENCOUNTER — Ambulatory Visit: Payer: PRIVATE HEALTH INSURANCE | Admitting: *Deleted

## 2011-04-29 NOTE — Op Note (Signed)
[name], [name]                ACCOUNT NO.:  [id number]  MEDICAL RECORD NO.:  [id number]           PATIENT TYPE:  O  LOCATION:  [facility]                         FACILITY:  [facility]  PHYSICIAN:  Hillis Range, MD       DATE OF BIRTH:  1941-10-20  DATE OF PROCEDURE: DATE OF DISCHARGE:  04/14/2011                              OPERATIVE REPORT   SURGEON:  Hillis Range, MD  PREPROCEDURE DIAGNOSES: 1. Mobitz II second-degree atrioventricular block. 2. Sinus node dysfunction with symptomatic bradycardia. 3. Pacemaker at elective replacement indicator.  POSTPROCEDURE DIAGNOSES: 1. Mobitz II second-degree atrioventricular block. 2. Sinus node dysfunction with symptomatic bradycardia. 3. Pacemaker at elective replacement indicator.  PROCEDURES: 1. Pacemaker pocket revision. 2. Pacemaker pulse generator replacement.  INTRODUCTION:  [name] is a pleasant 70 year old female with a history of sinus node dysfunction and Mobitz II second-degree AV block who initially underwent pacemaker implantation in 1993.  Her pacemaker pulse generator was replaced by Dr. Juanda Chance in 2003.  She now presents with pacemaker at elective replacement indicator.  We will proceed with pacemaker pulse generator replacement at this time.  DESCRIPTION OF PROCEDURE:  Informed written consent was obtained and the patient was brought to the Electrophysiology Lab in the fasting state. Her pacemaker was interrogated and confirmed to be at [facility] battery status.  Her right chest was prepped and draped in the usual sterile fashion by the EP lab staff.  The skin overlying her existing pacemaker was infiltrated with lidocaine for local analgesia.  A 4-cm incision was made over her existing pacemaker.  The pacemaker was exposed using a combination of sharp and blunt dissection.  Electrocautery was required to assure hemostasis.  A single silk suture was identified and carefully removed, which was securing the device to the  pectoralis fascia.  There was no other foreign matter or debris within the pocket.  The pacemaker was then removed from the pocket and disconnected from the leads.  The atrial lead was confirmed to be a model [id number] (serial number [id number]) lead implanted July 16, 1992.  The right ventricular lead was confirmed to be a Agricultural engineer T lead (serial number [id number]) implanted July 16, 1992.  Both leads were examined thoroughly and their integrities were confirmed to be intact.  Atrial lead P-waves measured 4.6 mV with impedance of 417 ohms and a threshold of 0.7 volts at 0.5 milliseconds.  Right ventricular lead R-waves measured 18.6 mV with impedance of 537 ohms and a threshold of 1.2 volts at 0.5 milliseconds.  Both leads were therefore connected to a Medtronic Adapta L model ADDRL1 (serial number [id number]) pacemaker.  The pocket was irrigated with copious gentamicin solution.  The pocket was then revised to accommodate the new device.  The pacemaker was then placed into the pocket.  The pocket was then closed in two layers with 2.0 Vicryl suture for the subcutaneous and subcuticular layers.  Steri-Strips and a sterile dressing were then applied.  There were no early apparent complications.  CONCLUSIONS: 1. Successful pacemaker pulse generator replacement for elective     replacement  indicator. 2. No early apparent complications.     Hillis Range, MD     JA/MEDQ  D:  04/14/2011  T:  04/14/2011  Job:  [id number]  cc:   Ramon Dredge L. Juanetta Gosling, M.D.  Electronically Signed by Hillis Range MD on 04/29/2011 05:41:07 PM

## 2011-05-04 ENCOUNTER — Ambulatory Visit (INDEPENDENT_AMBULATORY_CARE_PROVIDER_SITE_OTHER): Payer: PRIVATE HEALTH INSURANCE | Admitting: *Deleted

## 2011-05-04 ENCOUNTER — Other Ambulatory Visit: Payer: Self-pay | Admitting: Internal Medicine

## 2011-05-04 DIAGNOSIS — I442 Atrioventricular block, complete: Secondary | ICD-10-CM

## 2011-05-04 NOTE — Progress Notes (Signed)
Wound check pacer 

## 2011-05-24 ENCOUNTER — Emergency Department (HOSPITAL_COMMUNITY): Payer: PRIVATE HEALTH INSURANCE

## 2011-05-24 ENCOUNTER — Emergency Department (HOSPITAL_COMMUNITY)
Admission: EM | Admit: 2011-05-24 | Discharge: 2011-05-24 | Disposition: A | Discharge status: Home / Self Care | Payer: PRIVATE HEALTH INSURANCE | Attending: Emergency Medicine | Admitting: Emergency Medicine

## 2011-05-24 DIAGNOSIS — I2510 Atherosclerotic heart disease of native coronary artery without angina pectoris: Secondary | ICD-10-CM | POA: Insufficient documentation

## 2011-05-24 DIAGNOSIS — M7989 Other specified soft tissue disorders: Secondary | ICD-10-CM | POA: Insufficient documentation

## 2011-05-24 DIAGNOSIS — E7800 Pure hypercholesterolemia, unspecified: Secondary | ICD-10-CM | POA: Insufficient documentation

## 2011-05-24 DIAGNOSIS — H11009 Unspecified pterygium of unspecified eye: Secondary | ICD-10-CM | POA: Insufficient documentation

## 2011-05-24 DIAGNOSIS — I10 Essential (primary) hypertension: Secondary | ICD-10-CM | POA: Insufficient documentation

## 2011-05-24 DIAGNOSIS — R079 Chest pain, unspecified: Secondary | ICD-10-CM | POA: Insufficient documentation

## 2011-05-24 DIAGNOSIS — G40802 Other epilepsy, not intractable, without status epilepticus: Secondary | ICD-10-CM | POA: Insufficient documentation

## 2011-05-24 DIAGNOSIS — M129 Arthropathy, unspecified: Secondary | ICD-10-CM | POA: Insufficient documentation

## 2011-05-24 DIAGNOSIS — Z950 Presence of cardiac pacemaker: Secondary | ICD-10-CM | POA: Insufficient documentation

## 2011-05-24 DIAGNOSIS — E119 Type 2 diabetes mellitus without complications: Secondary | ICD-10-CM | POA: Insufficient documentation

## 2011-05-24 DIAGNOSIS — E785 Hyperlipidemia, unspecified: Secondary | ICD-10-CM | POA: Insufficient documentation

## 2011-05-24 DIAGNOSIS — I509 Heart failure, unspecified: Secondary | ICD-10-CM | POA: Insufficient documentation

## 2011-05-24 LAB — BASIC METABOLIC PANEL
Calcium: 10 mg/dL (ref 8.4–10.5)
Creatinine, Ser: 0.91 mg/dL (ref 0.50–1.10)
GFR calc Af Amer: >60 mL/min (ref >60)

## 2011-05-24 LAB — CBC
MCH: 29.2 pg (ref 26.0–34.0)
MCHC: 33.1 g/dL (ref 30.0–36.0)
MCV: 88.2 fL (ref 78.0–100.0)
Platelets: 177 10*3/uL (ref 150–400)
RDW: 15.3 % (ref 11.5–15.5)
WBC: 8.2 10*3/uL (ref 4.0–10.5)

## 2011-05-24 LAB — DIFFERENTIAL
Eosinophils Absolute: 0.3 10*3/uL (ref 0.0–0.7)
Eosinophils Relative: 3 % (ref 0–5)
Lymphs Abs: 2.3 10*3/uL (ref 0.7–4.0)
Monocytes Relative: 8 % (ref 3–12)
Neutrophils Relative %: 60 % (ref 43–77)

## 2011-05-24 LAB — CK TOTAL AND CKMB (NOT AT ARMC): Total CK: 106 U/L (ref 7–177)

## 2011-05-31 ENCOUNTER — Ambulatory Visit (HOSPITAL_COMMUNITY)
Admission: RE | Admit: 2011-05-31 | Discharge: 2011-05-31 | Disposition: A | Discharge status: Home / Self Care | Payer: PRIVATE HEALTH INSURANCE | Source: Ambulatory Visit | Attending: Pulmonary Disease | Admitting: Pulmonary Disease

## 2011-05-31 DIAGNOSIS — R0609 Other forms of dyspnea: Secondary | ICD-10-CM | POA: Insufficient documentation

## 2011-05-31 DIAGNOSIS — R0989 Other specified symptoms and signs involving the circulatory and respiratory systems: Secondary | ICD-10-CM | POA: Insufficient documentation

## 2011-05-31 LAB — BLOOD GAS, ARTERIAL
Acid-Base Excess: 2.1 mmol/L — ABNORMAL HIGH (ref 0.0–2.0)
TCO2: 23.8 mmol/L (ref 0–100)
pCO2 arterial: 43.6 mmHg (ref 35.0–45.0)
pO2, Arterial: 75.1 mmHg — ABNORMAL LOW (ref 80.0–100.0)

## 2011-06-13 ENCOUNTER — Telehealth: Payer: Self-pay | Admitting: Internal Medicine

## 2011-06-13 NOTE — Telephone Encounter (Signed)
Pt needs a call to [place] [employer] dept at [phone] , [name] to let her know pt has an appt Wednesday so she can get reimbursed

## 2011-06-13 NOTE — Telephone Encounter (Signed)
LEFT  WORD ON ANSWERING MACHINE PT HAS APPT  WITH DR Johney Frame ON 06-15-11 AT 10:15 AM

## 2011-06-15 ENCOUNTER — Encounter: Payer: Self-pay | Admitting: Internal Medicine

## 2011-06-15 ENCOUNTER — Ambulatory Visit (INDEPENDENT_AMBULATORY_CARE_PROVIDER_SITE_OTHER): Payer: PRIVATE HEALTH INSURANCE | Admitting: Internal Medicine

## 2011-06-15 DIAGNOSIS — I442 Atrioventricular block, complete: Secondary | ICD-10-CM

## 2011-06-15 DIAGNOSIS — I25810 Atherosclerosis of coronary artery bypass graft(s) without angina pectoris: Secondary | ICD-10-CM

## 2011-06-15 DIAGNOSIS — I499 Cardiac arrhythmia, unspecified: Secondary | ICD-10-CM

## 2011-06-15 DIAGNOSIS — I10 Essential (primary) hypertension: Secondary | ICD-10-CM

## 2011-06-15 DIAGNOSIS — Z950 Presence of cardiac pacemaker: Secondary | ICD-10-CM

## 2011-06-15 LAB — PACEMAKER DEVICE OBSERVATION
AL THRESHOLD: 1 V
ATRIAL PACING PM: 76
RV LEAD AMPLITUDE: 22.4 mv
RV LEAD IMPEDENCE PM: 592 Ohm
RV LEAD THRESHOLD: 1.25 V

## 2011-06-15 NOTE — Patient Instructions (Addendum)
Your physician wants you to follow-up in: May with Dr Allred You will receive a reminder letter in the mail two months in advance. If you don't receive a letter, please call our office to schedule the follow-up appointment.  

## 2011-06-16 ENCOUNTER — Encounter: Payer: Self-pay | Admitting: Internal Medicine

## 2011-06-16 NOTE — Assessment & Plan Note (Signed)
Stable angina EKG today reveals A pacing, nonspecific ST/T changes  Continue current medical regimen

## 2011-06-16 NOTE — Assessment & Plan Note (Signed)
Stable No change required today  

## 2011-06-16 NOTE — Assessment & Plan Note (Signed)
Normal pacemaker function See Pace Art report No changes today  

## 2011-06-16 NOTE — Progress Notes (Signed)
The patient presents today for routine electrophysiology followup.  Since her recent pacemaker generator change, the patient reports doing reasonably well.  She has stable SOB.  Recent PFTs revealed a severe ventilatory defect with evidence of airflow obstruction.  She has stable angina.  Today, she denies symptoms of palpitations,orthopnea, PND, lower extremity edema, dizziness, presyncope, syncope, or neurologic sequela.  The patient feels that she is tolerating medications without difficulties and is otherwise without complaint today.   Past Medical History  Diagnosis Date  . Diabetes mellitus   . HTN (hypertension)   . Seizure disorder   . CAD (coronary artery disease)     multivessel, CABG 6/10.  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  Myoview (2/11) showed EF 62%, fixed septal defect c/w  breast attenuation, minimal inferolateral reversible defect (attenuation versus mild ischemia).  Overall low risk  study  . GERD (gastroesophageal reflux disease)   . Hyperlipidemia   . DVT (deep venous thrombosis)     R arm venous occlusion s/p prior PPM  . Obesity   . DDD (degenerative disc disease)   . Complete heart block 1993    s/p PPM, most recent generator change 2003  . Dehiscence of closure of sternum or sternotomy 8/10    required flap  . COPD (chronic obstructive pulmonary disease)     severe lung disease by PFTs 6/12   Past Surgical History  Procedure Date  . Pacemaker placement 1993    Status post DDD pacemaker implantation in 1993, with Medtronic Kappa generator change in 2003 for  second-degree AV block, most recent gen change by Fawn Kirk 04/14/11  . Revision total hip arthroplasty   . Replacement total knee   . Thyroid surgery   . Wrist surgery   . Coronary artery bypass graft     - LIMA to LAD, SVG to OM, SVG to PDA  . Reconstructive repair sternal     for infection s/p CABG 8/10    Current Outpatient Prescriptions  Medication Sig Dispense Refill  . alprazolam (XANAX) 2 MG tablet Take 2 mg by mouth  at bedtime as needed.        Marland Kitchen amLODipine-benazepril (LOTREL) 10-20 MG per capsule Take 1 capsule by mouth daily.        Marland Kitchen aspirin 325 MG tablet Take 325 mg by mouth daily.        Marland Kitchen esomeprazole (NEXIUM) 40 MG capsule Take 40 mg by mouth daily before breakfast.       . estrogens conjugated, synthetic A, (CENESTIN) 0.625 MG tablet Take 0.625 mg by mouth daily.        . furosemide (LASIX) 20 MG tablet 1 tab po bid      . gabapentin (NEURONTIN) 100 MG tablet Take 100 mg by mouth 3 (three) times daily.        Marland Kitchen HYDROcodone-acetaminophen (VICODIN) 5-500 MG per tablet Take 1 tablet by mouth every 6 (six) hours as needed.        . metFORMIN (GLUCOPHAGE) 500 MG tablet Take 500 mg by mouth daily.        . metoprolol succinate (TOPROL-XL) 25 MG 24 hr tablet Take 25 mg by mouth daily.        . nabumetone (RELAFEN) 500 MG tablet Take 500 mg by mouth daily.        . NON FORMULARY Oxygen at night. 2 litters       . olopatadine (PATANOL) 0.1 % ophthalmic solution 1 drop 2 (two) times daily.        Marland Kitchen  Omega-3 Fatty Acids (FISH OIL) 1000 MG CAPS Take 2 capsules by mouth daily.        . phenytoin (DILANTIN) 100 MG ER capsule 300 mg daily. 3 tabs po qd      . potassium chloride (KLOR-CON) 10 MEQ CR tablet Take 10 mEq by mouth daily.        . simvastatin (ZOCOR) 40 MG tablet Take 40 mg by mouth at bedtime.          Allergies  Allergen Reactions  . Codeine   . Oxycodone Hcl     History   Social History  . Marital Status: Married    Spouse Name: N/A    Number of Children: N/A  . Years of Education: N/A   Occupational History  . Not on file.   Social History Main Topics  . Smoking status: Never Smoker   . Smokeless tobacco: Not on file  . Alcohol Use: No  . Drug Use: No  . Sexually Active: Not on file   Other Topics Concern  . Not on file   Social History Narrative  . No narrative on file    Family History  Problem Relation Age of Onset  . Diabetes    . Heart disease      Physical  Exam: Filed Vitals:   06/15/11 1009  BP: 113/66  Pulse: 82  Resp: 20  Height: 5\' 2"  (1.575 m)  Weight: 253 lb 6.4 oz (114.941 kg)    GEN- The patient is chronically ill appearing, alert and oriented x 3 today.   Head- normocephalic, atraumatic Eyes-  Sclera clear, conjunctiva pink Ears- hearing intact Oropharynx- clear Neck- supple, no JVP Lymph- no cervical lymphadenopathy Lungs- decreased air movement, normal work of breathing Chest- pacemaker pocket is well healed Heart- Regular rate and rhythm, no murmurs, rubs or gallops, PMI not laterally displaced GI- soft, NT, ND, + BS Extremities- no clubbing, cyanosis, or edema MS- no significant deformity or atrophy Skin- no rash or lesion Psych- euthymic mood, full affect Neuro- strength and sensation are intact  Pacemaker interrogation- reviewed in detail today,  See PACEART report  Assessment and Plan:

## 2011-06-22 ENCOUNTER — Telehealth: Payer: Self-pay | Admitting: Internal Medicine

## 2011-06-22 MED ORDER — METOPROLOL SUCCINATE ER 50 MG PO TB24: 50.0000 mg | ORAL_TABLET | Freq: Every day | ORAL | Status: DC

## 2011-06-22 NOTE — Telephone Encounter (Signed)
Discuused with Dr Johney Frame   Will have patient increase her Metoprolol to 50mg  daily  She is to let us know if this helps  If she continues to have pain we will start Imdur 30mg  daily  Patient aware of changes

## 2011-06-22 NOTE — Telephone Encounter (Signed)
Per pt call, pt experiencing chest pain, pt has already taken 3 nitros.  Chest pain has been last 15 mins. Severe pain on 1-10 scale. Transferred pt call to [place].

## 2011-06-22 NOTE — Telephone Encounter (Signed)
Spoke w/pt she states she had a big morning with a lot to do and went to bible study she came home and sat down to relax about 1:10 and started having severe chest pain 10/10 "heaviness" so she took a ntg and had just a little relief so she took a second ntg 5 min later.  It continued to ease up but was still having slight discomfort at 1:35 and took 3rd ntg.  She states that now it is much better but still 2/10 "heaviness" she states no SOB and as we talk she states it is continuing to ease up, she does not seem to be in distress and placed me on hold to discuss kids coming over with her grandchildren.  Pt states she saw Dr Johney Frame last week and he told her to let him know when she has chest pain, she states she has been in [facility] and transferred to [facility] recently w/CP and they could not find a cause.  Advised Dr Johney Frame in office today will discuss w/him and call her back

## 2011-06-25 IMAGING — CR DG ELBOW COMPLETE 3+V*R*
4 series · 4 of 4 positions shown · non-contrast
Comparison: 12/27/2009

CLINICAL DATA: Bilateral elbow pain, stiffness

RIGHT ELBOW - COMPLETE 3+ VIEW

[view not recorded (1 of 4)]
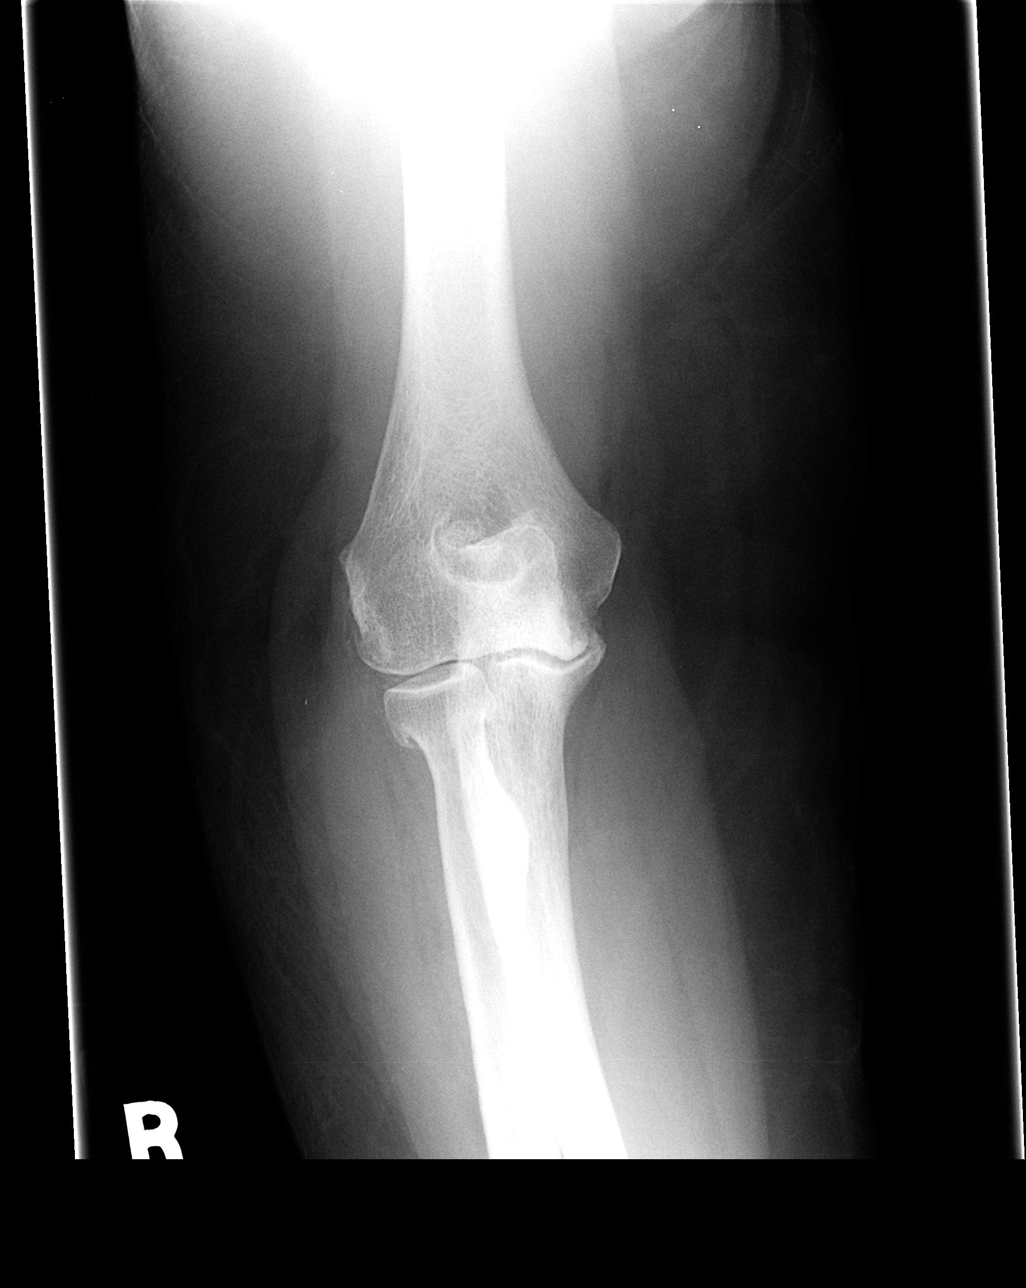

[view not recorded (2 of 4)]
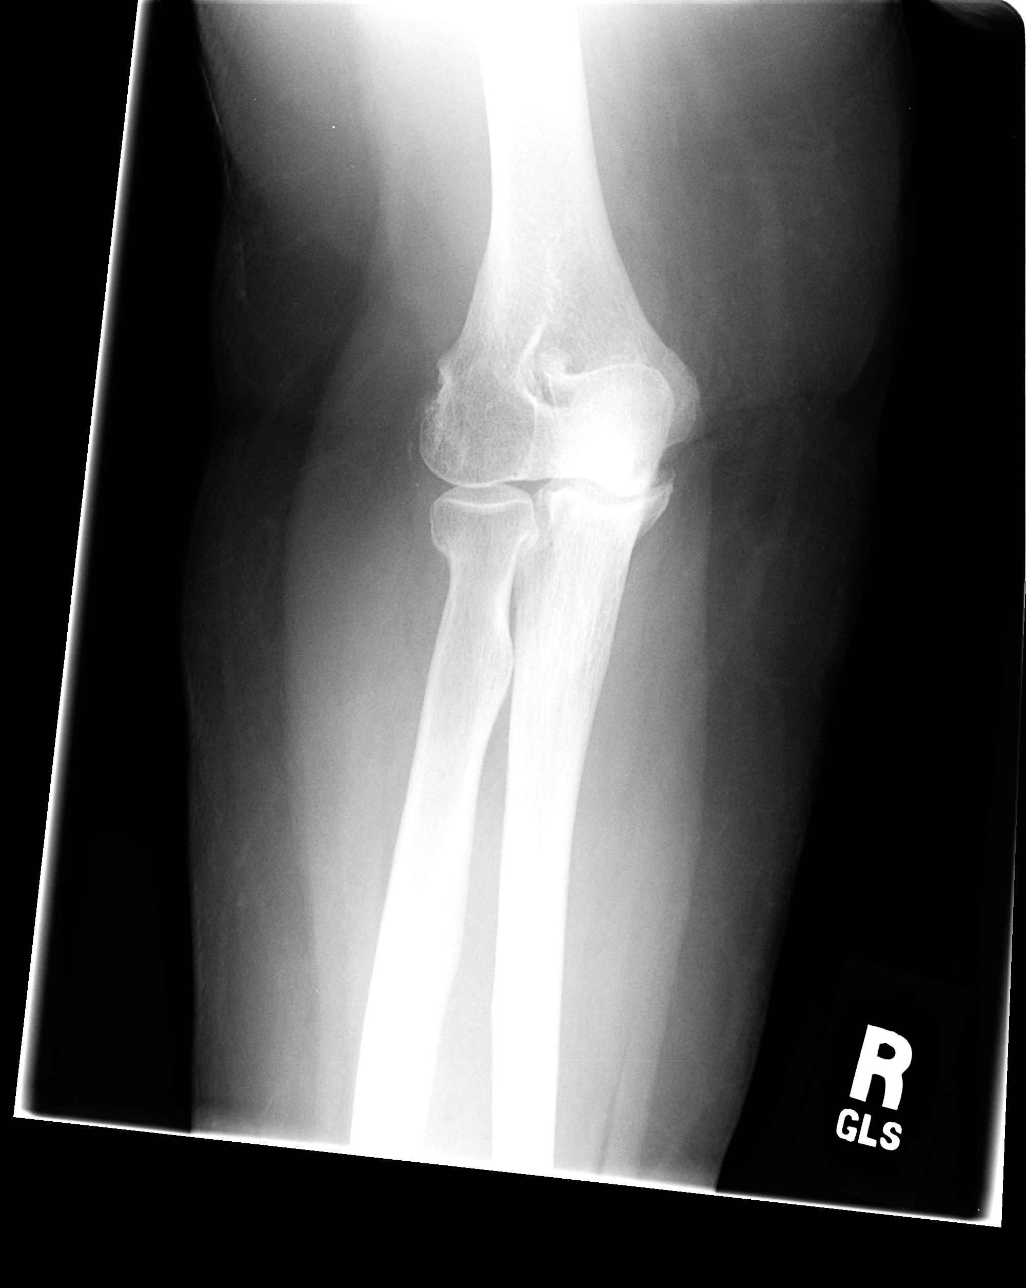

[view not recorded (3 of 4)]
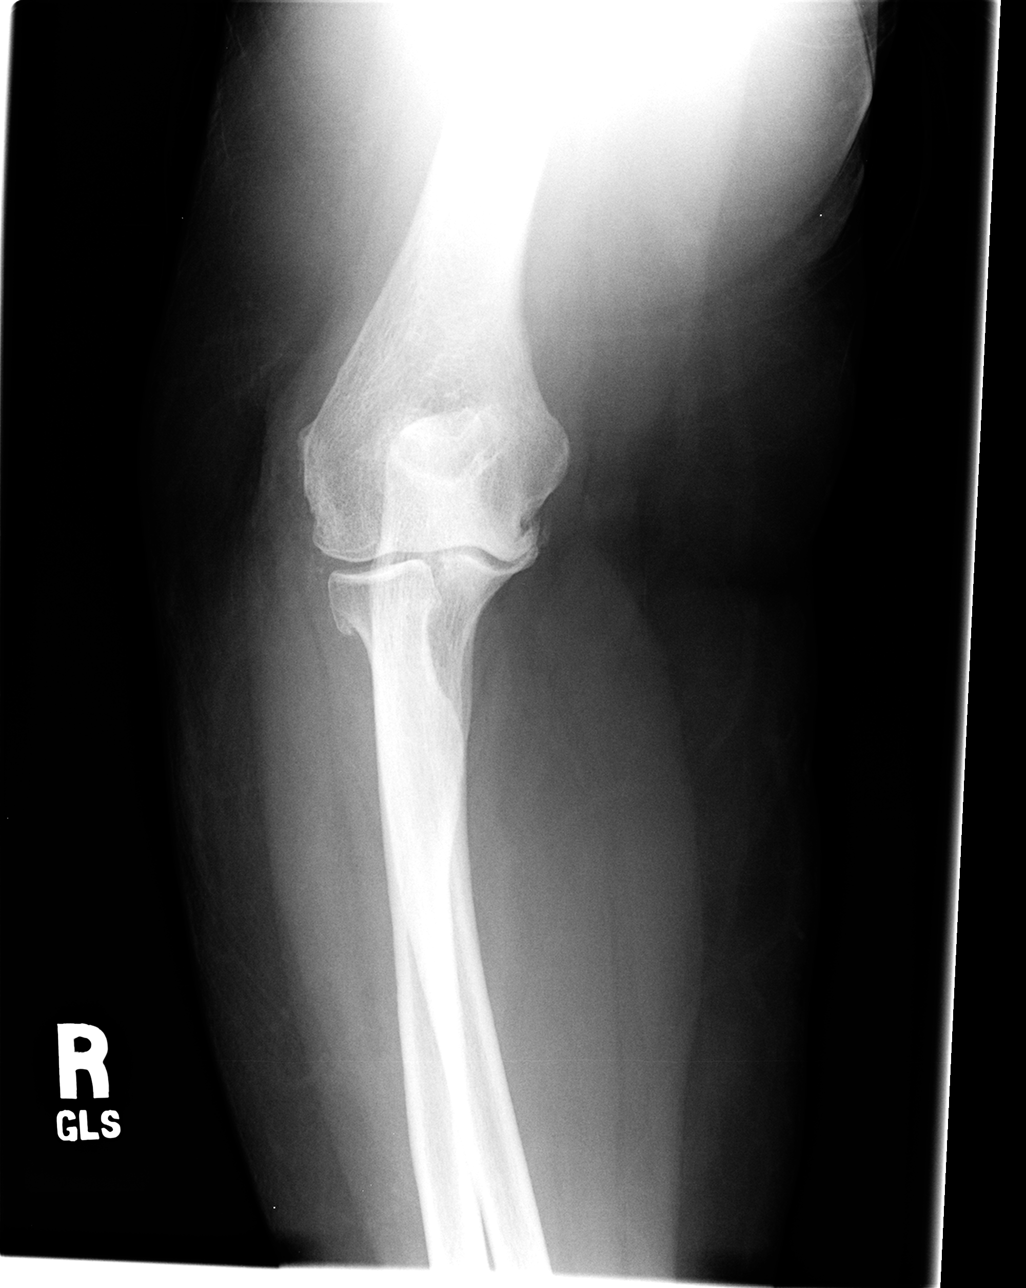

[view not recorded (4 of 4)]
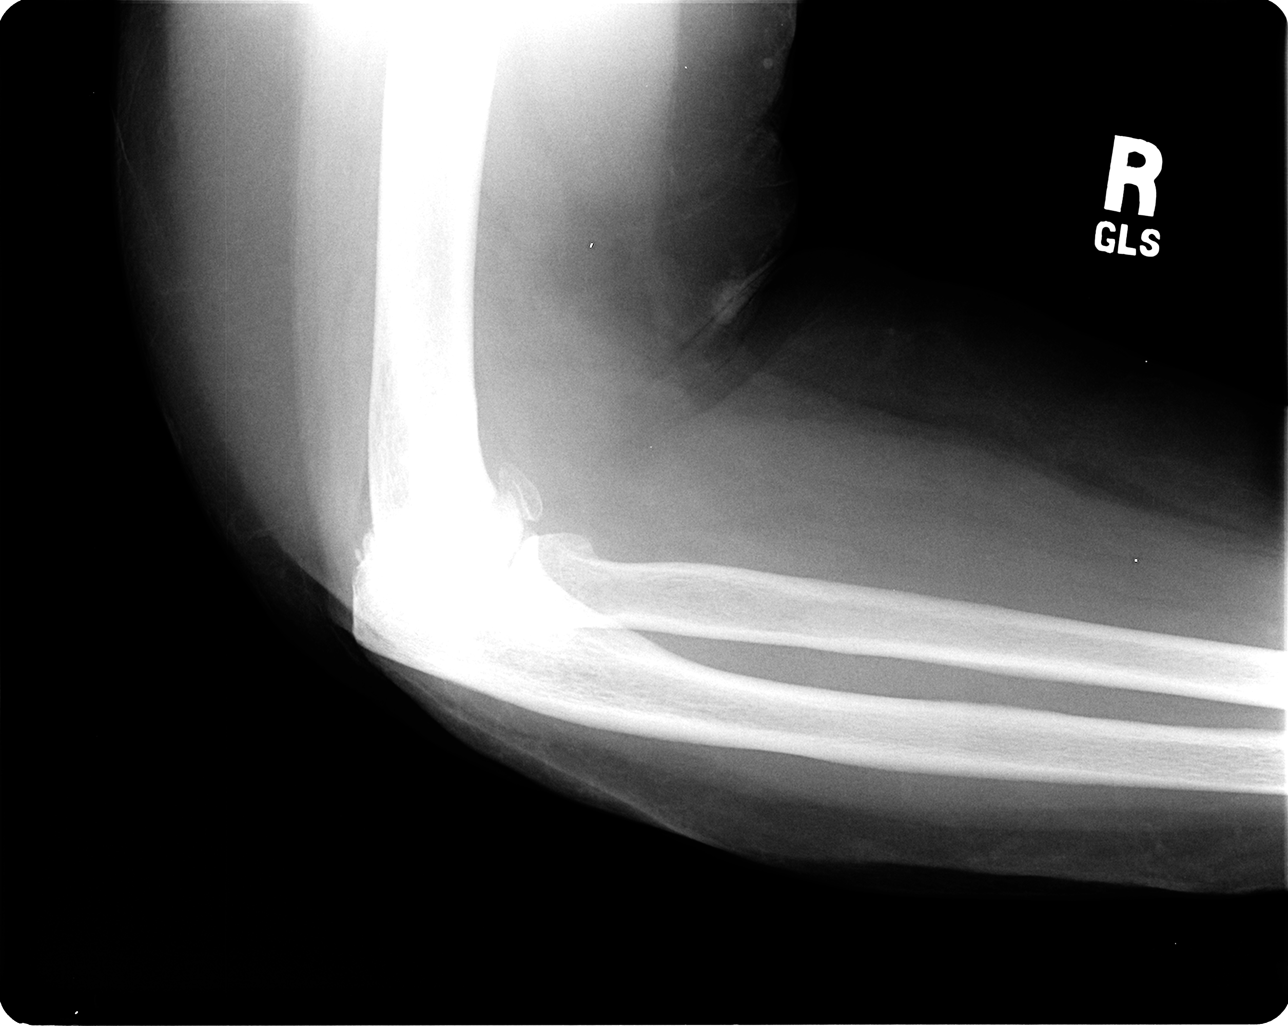

[4 of 4 positions shown; findings below may reference images not displayed]

FINDINGS: Degenerative arthritic changes noted of the left elbow
joint with joint space narrowing, osteophyte formation, and
possibly a small loose articular body on the lateral view
anteriorly.  No large effusion.  No displaced fracture or
malalignment.
IMPRESSION: Right elbow degenerative arthritic changes.  Possible loose joint
body.
No acute bony abnormality or effusion.

## 2011-06-25 IMAGING — CR DG ELBOW COMPLETE 3+V*L*
4 series · 4 of 4 positions shown · non-contrast
Comparison: None.

CLINICAL DATA: L elbow pain, stiffness

LEFT ELBOW - COMPLETE 3+ VIEW

[view not recorded (1 of 4)]
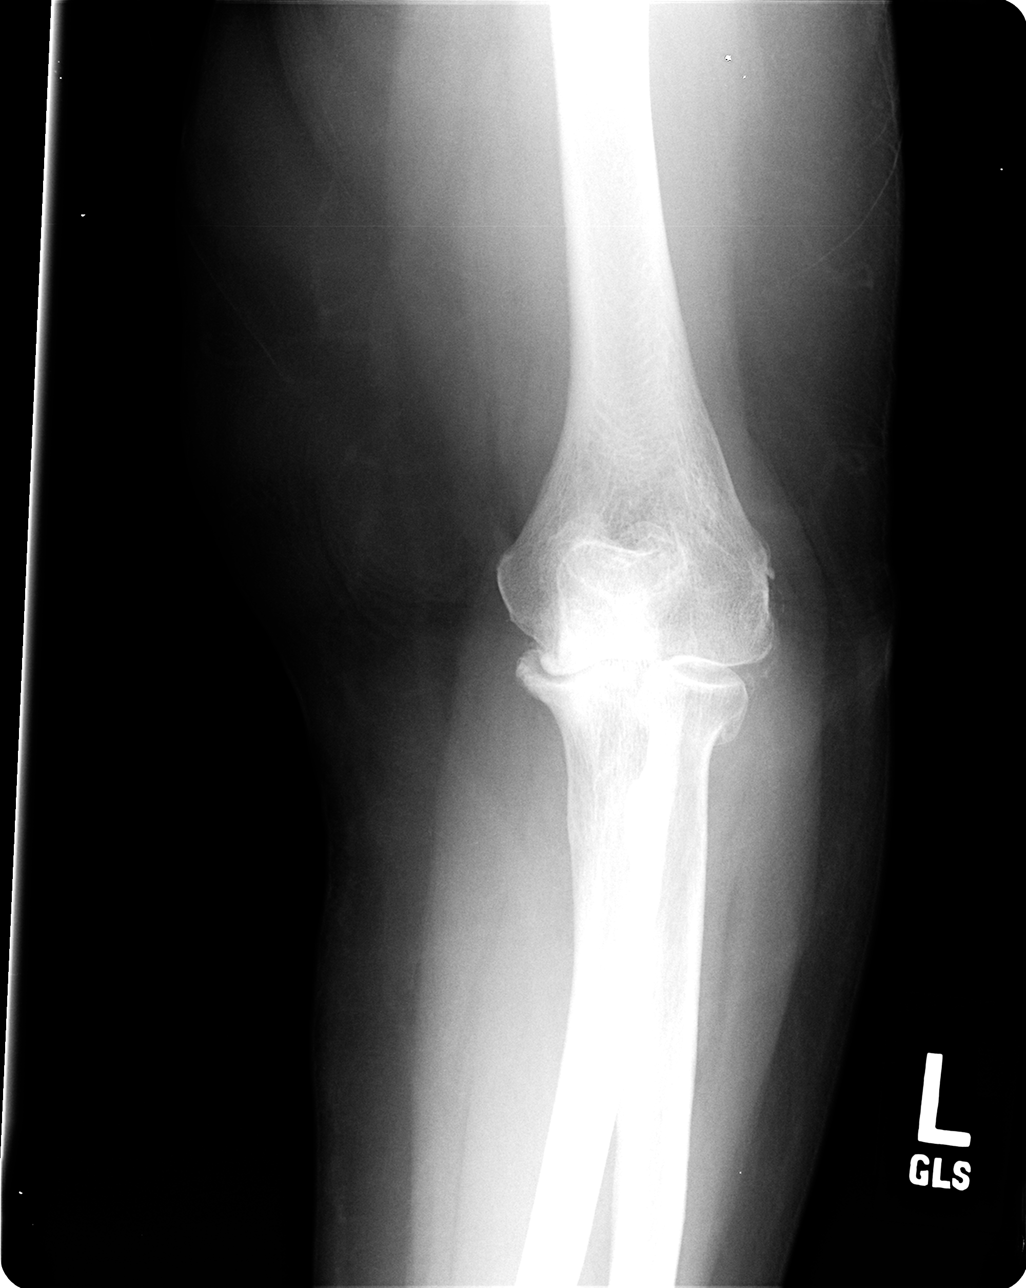

[view not recorded (2 of 4)]
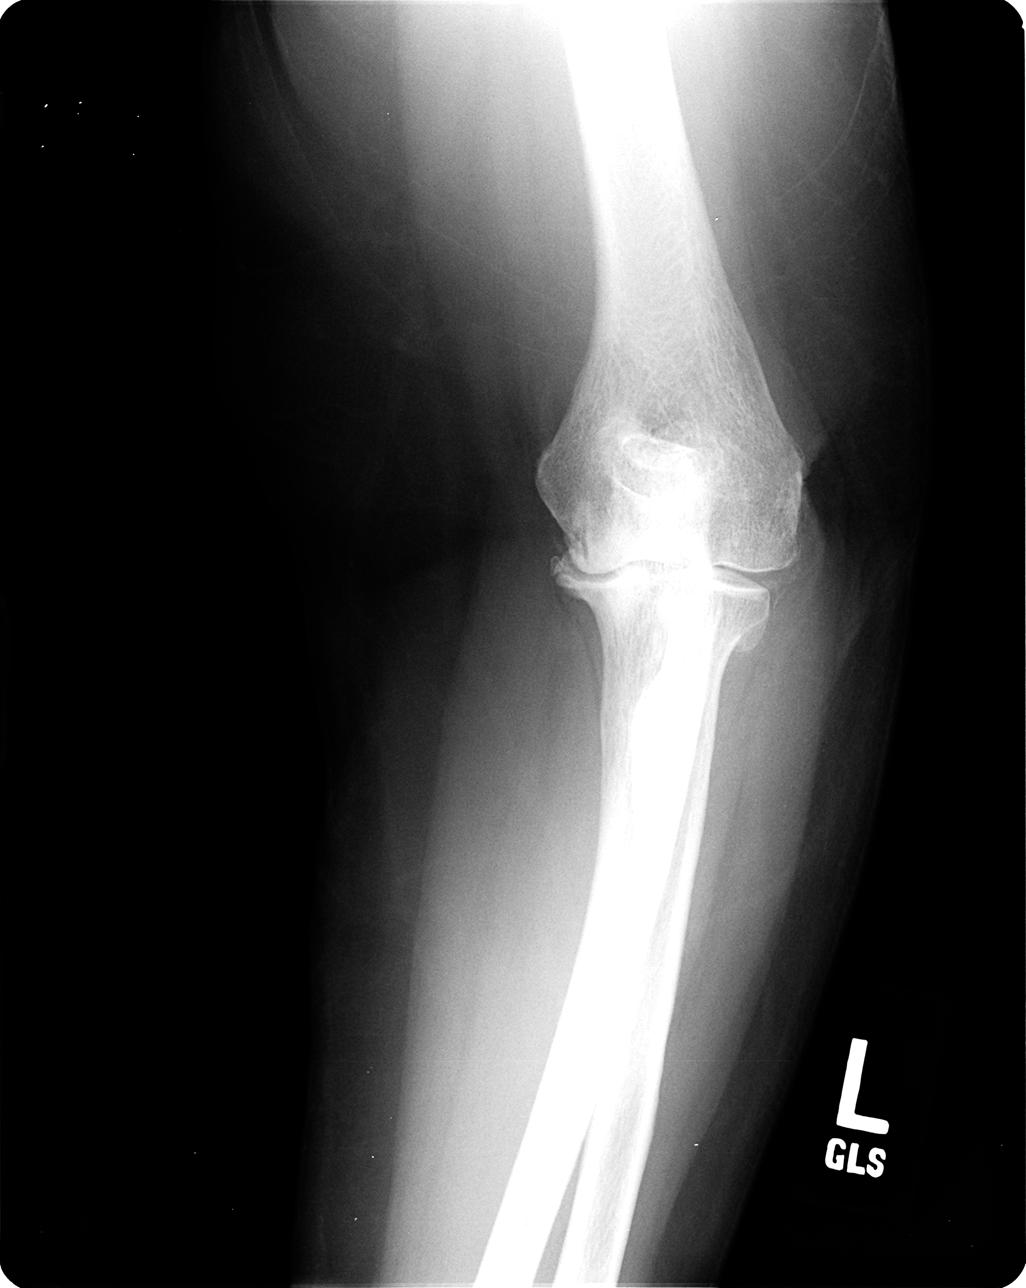

[view not recorded (3 of 4)]
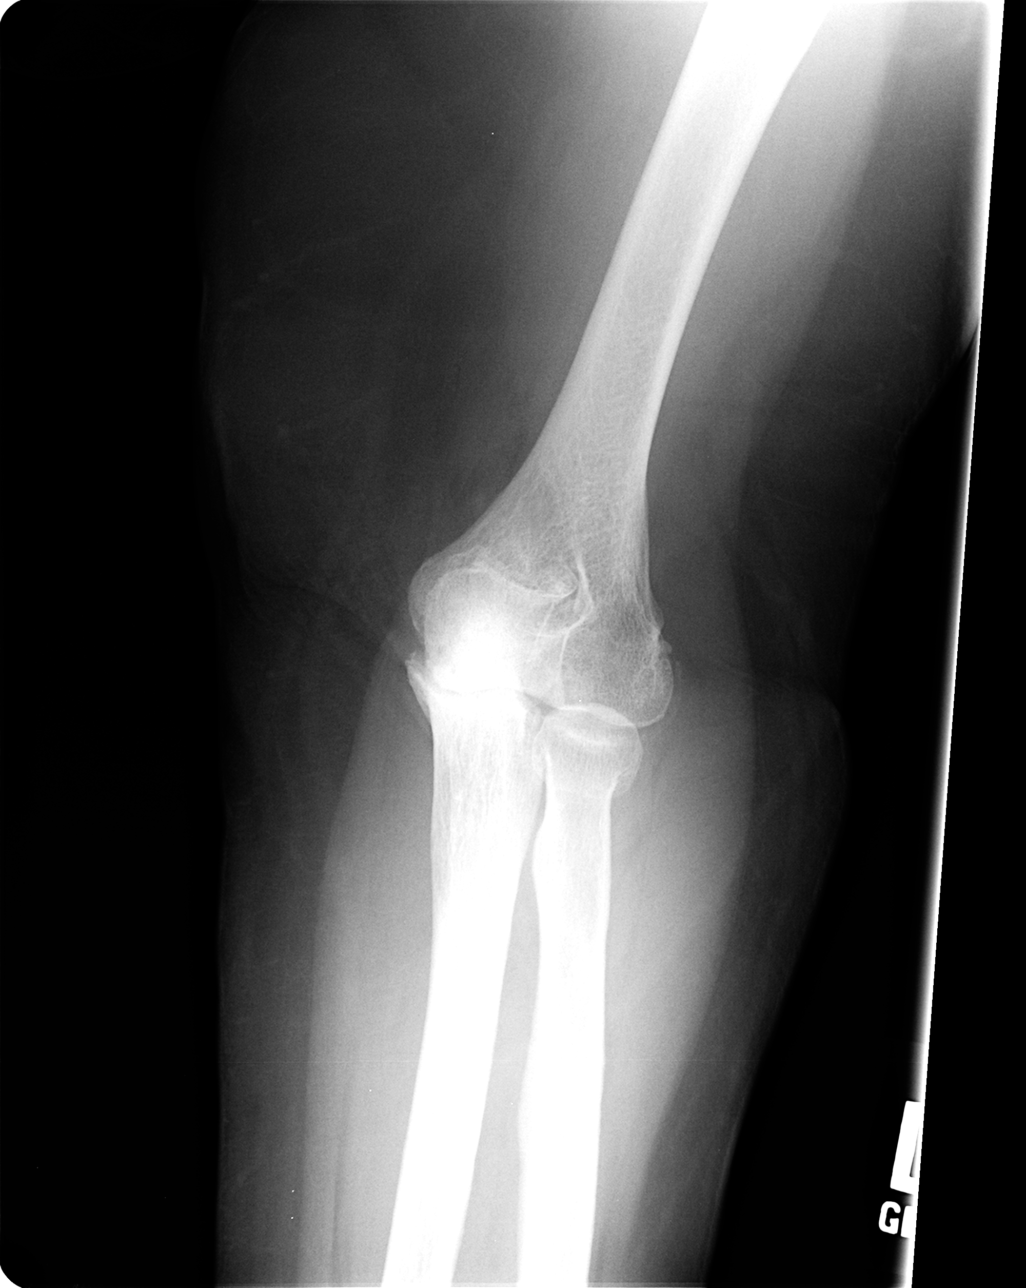

[view not recorded (4 of 4)]
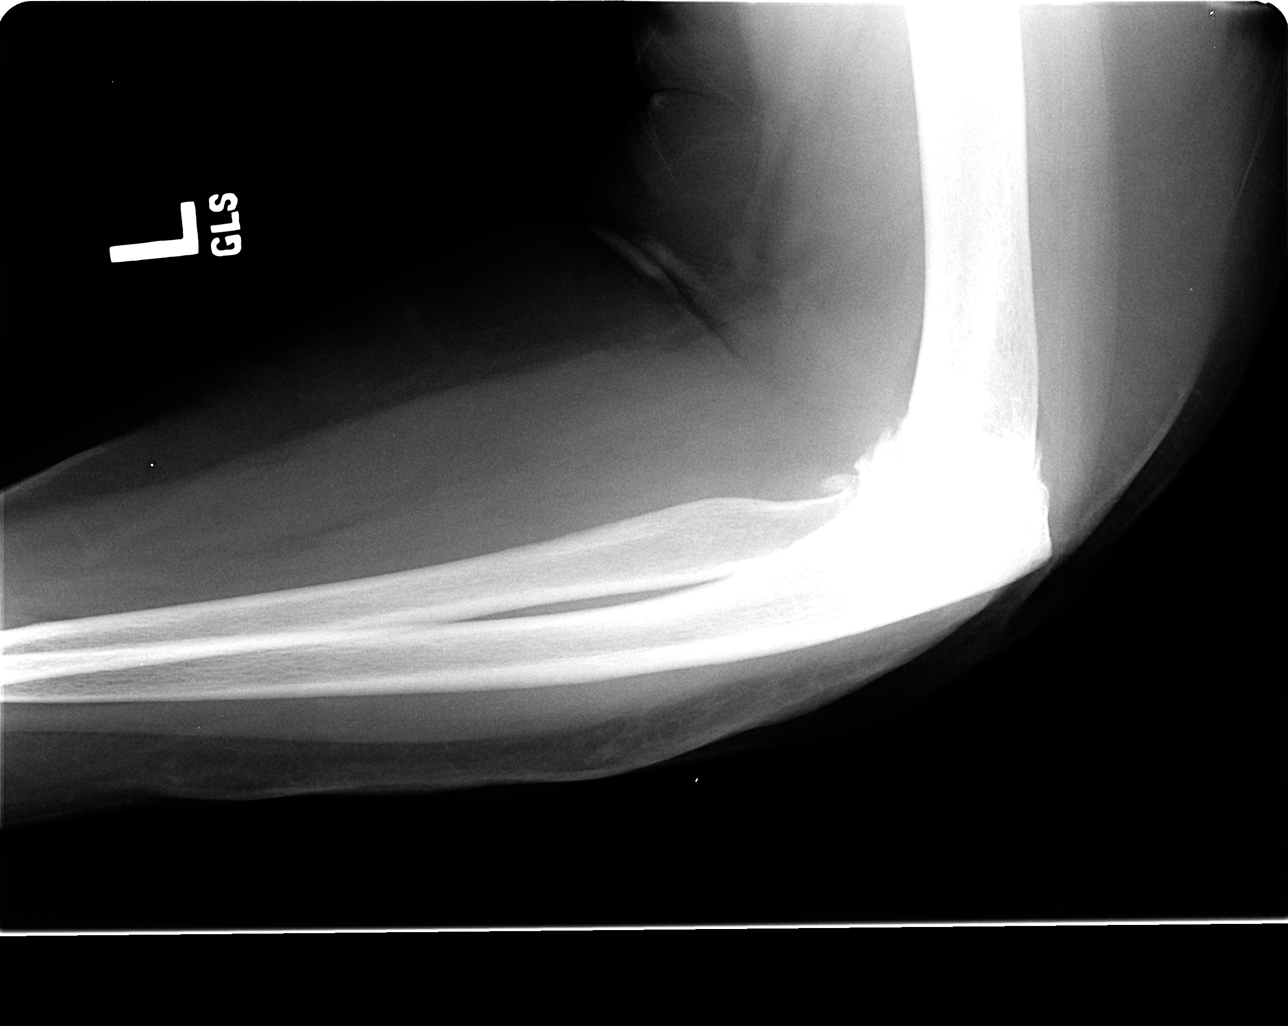

[4 of 4 positions shown; findings below may reference images not displayed]

FINDINGS: Left elbow degenerative osteoarthritis with osteophyte
formation, joint space narrowing and sclerosis.  Lateral view is
limited because of projection.  No large effusion or definite
fracture.  Radial head appears intact.
IMPRESSION: Left elbow degenerative osteoarthritis.  Limited exam.

## 2011-06-26 IMAGING — CT CT L SPINE W/O CM
4 of 9 series · 13 of 33 positions shown, 15 images · non-contrast
Comparison: 09/26/2008

CLINICAL DATA: Prior fusion.  Low back pain.  Left leg numbness.

CT LUMBAR SPINE WITHOUT CONTRAST
TECHNIQUE: Multidetector CT imaging of the lumbar spine was
performed without intravenous contrast administration. Multiplanar
CT image reconstructions were also generated.

[Series 3: l spine bone · axial · 0.27mm/px · z∈[-10,+50]mm · 2 of 74 slices shown, 3 images]
[im 25/74  soft-tissue]
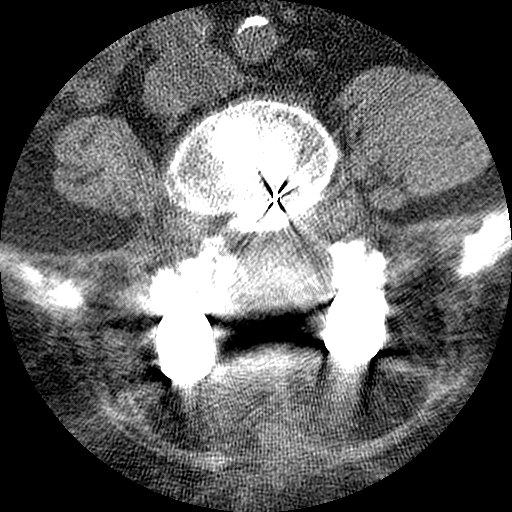
[im 25/74  bone]
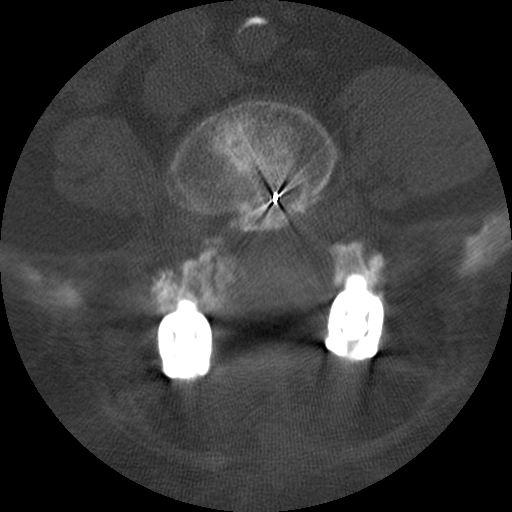
[im 49/74  bone]
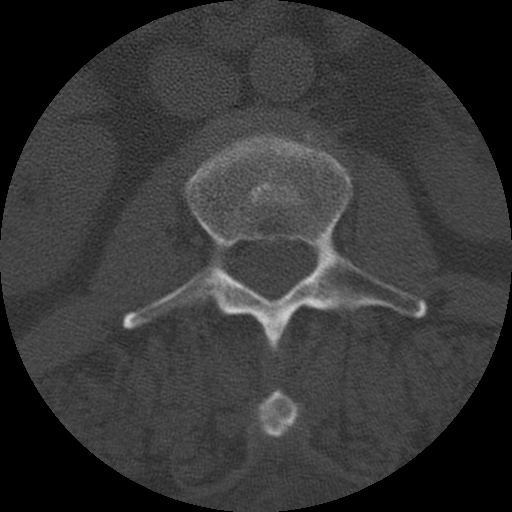

[Series 4: l spine soft · axial · 0.27mm/px · z∈[-26,+64]mm · 3 of 74 slices shown]
[im 19/74  soft-tissue]
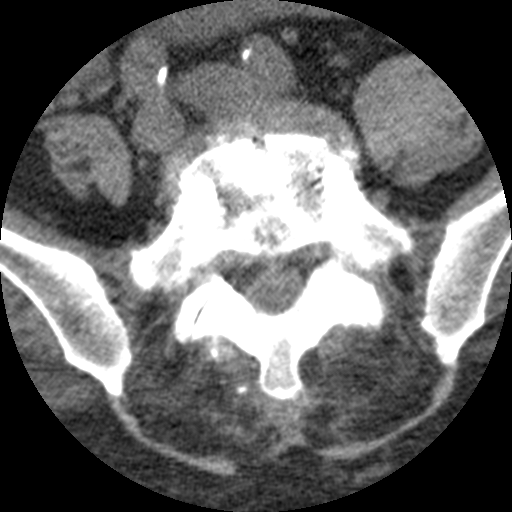
[im 37/74  soft-tissue]
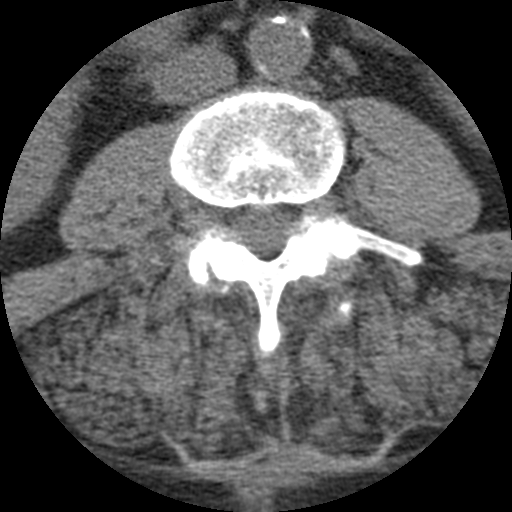
[im 55/74  soft-tissue]
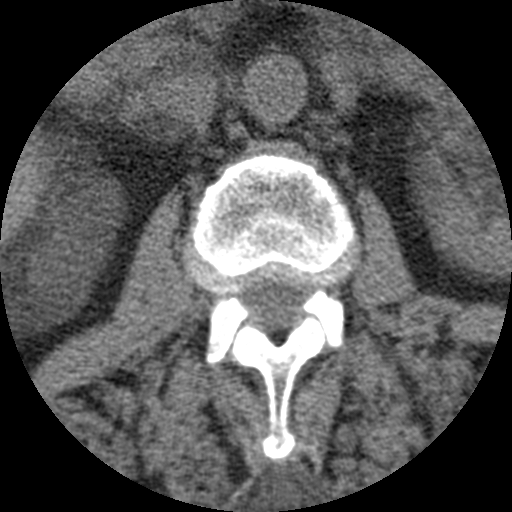

[Series 200: coronal · coronal · 0.36mm/px · 3 of 51 slices shown]
[im 11/51  bone]
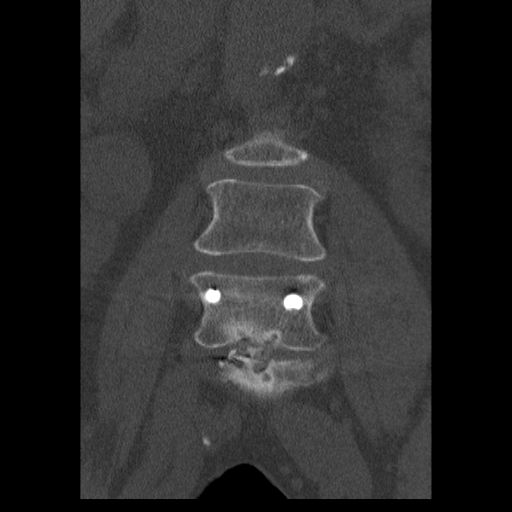
[im 21/51  bone]
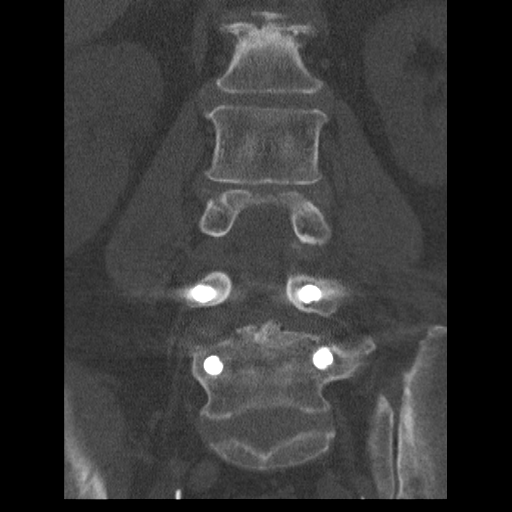
[im 31/51  bone]
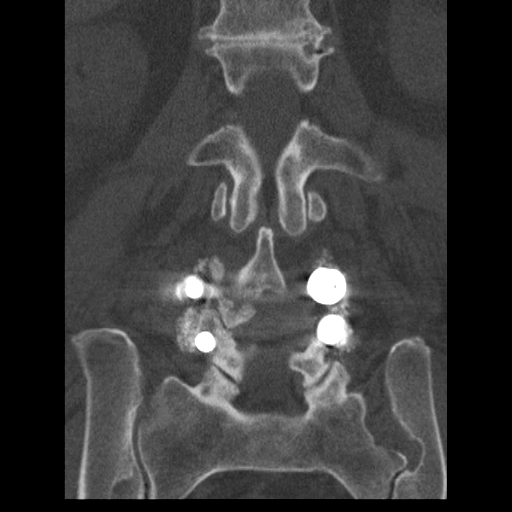

[Series 201: sagittal · sagittal · 0.36mm/px · 5 of 42 slices shown, 6 images]
[im 14/42  bone]
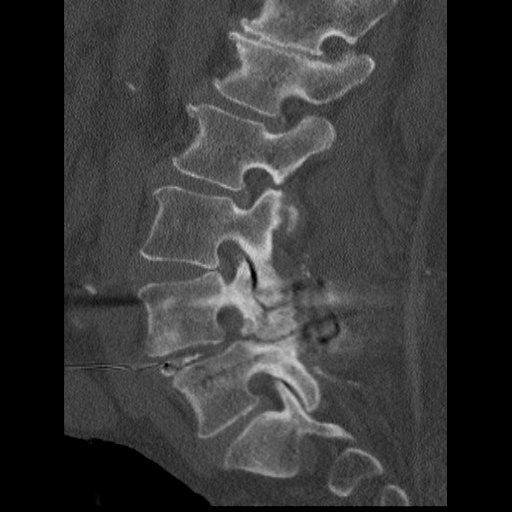
[im 18/42  bone]
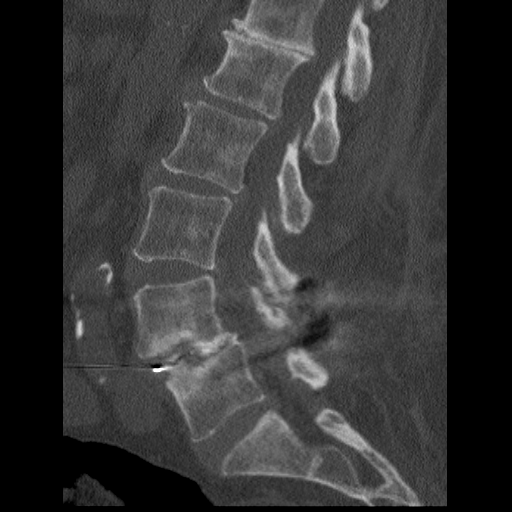
[im 21/42  soft-tissue]
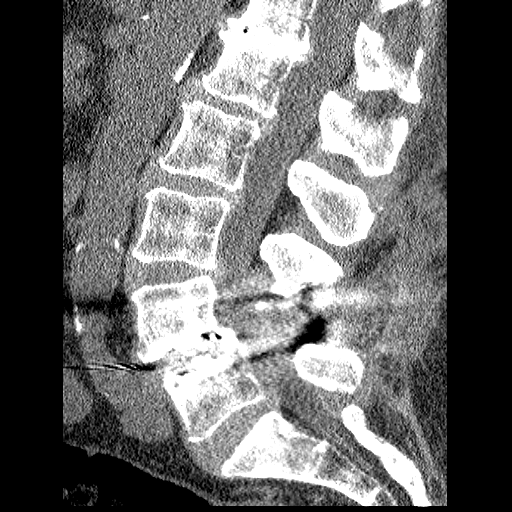
[im 21/42  bone]
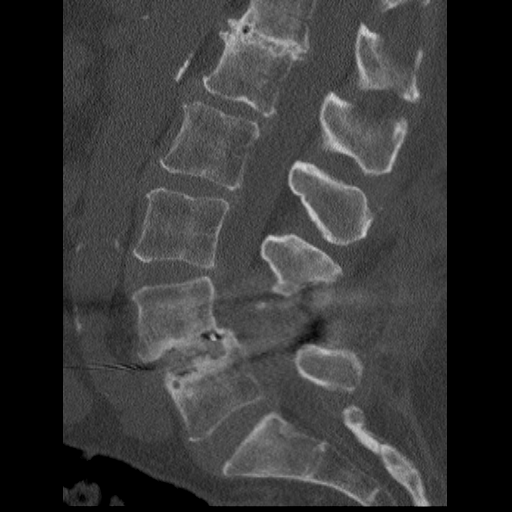
[im 24/42  bone]
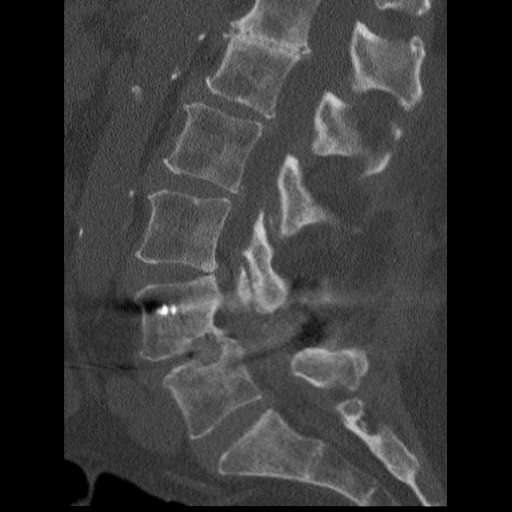
[im 28/42  bone]
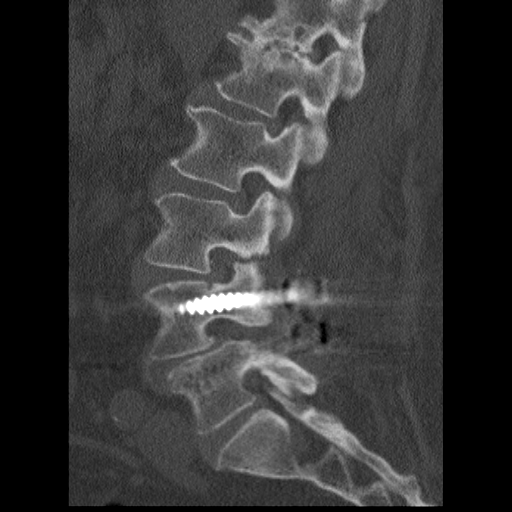

[13 of 33 positions shown; findings below may reference images not displayed]

FINDINGS: T12-L1:  Chronic disc degeneration and mild facet
degeneration.  Mild narrowing of the canal and foramina, not
grossly compressive.  No change since September 2008.

L1-2:  Mild circumferential bulging of the disc.  Mild narrowing of
the lateral recesses without apparent neural compression.

L2-3:  Circumferential bulging of the disc.  Mild narrowing of the
lateral recesses without apparent neural compression.

L3-4:  There is been progressive degenerative disease with disc
space height loss, circumferential protrusion of disc material, and
worsening of bilateral facet arthropathy with ligamentous
hypertrophy.  There appears to be severe multifactorial stenosis at
this level.

L4-5:  Bilateral pedicle screws are well positioned.  Interbody
fusion material is in place.  I cannot definitely establish that
there is solid fusion at this level.  However, there does not
appear to be peri screw lucency to suggest screw motion.  The
central canal appears sufficiently decompressed.  There is mild
foraminal stenosis bilaterally. Anterolisthesis of 3 mm appears the
same

L5-S1:  Bulging of the disc without apparent compressive stenosis.
Mild facet degeneration without slippage.
IMPRESSION: Worsening of degenerative disease at L3-4 when compared to September 2008.  Severe multifactorial stenosis at this level.

Previous posterior decompression and fusion at L4-5.  Components
appear well positioned.  However, I cannot definitely establish
solid fusion across the disc space.  However, there is no peri
screw lucency to suggest motion.

## 2011-07-07 ENCOUNTER — Ambulatory Visit (INDEPENDENT_AMBULATORY_CARE_PROVIDER_SITE_OTHER): Payer: PRIVATE HEALTH INSURANCE | Admitting: Otolaryngology

## 2011-07-07 DIAGNOSIS — K120 Recurrent oral aphthae: Secondary | ICD-10-CM

## 2011-07-10 IMAGING — CR DG CHEST 1V PORT
1 series · 1 of 1 positions shown · non-contrast
Comparison: 07/20/2009

CLINICAL DATA: Chest pain

PORTABLE CHEST - 1 VIEW

[view not recorded]
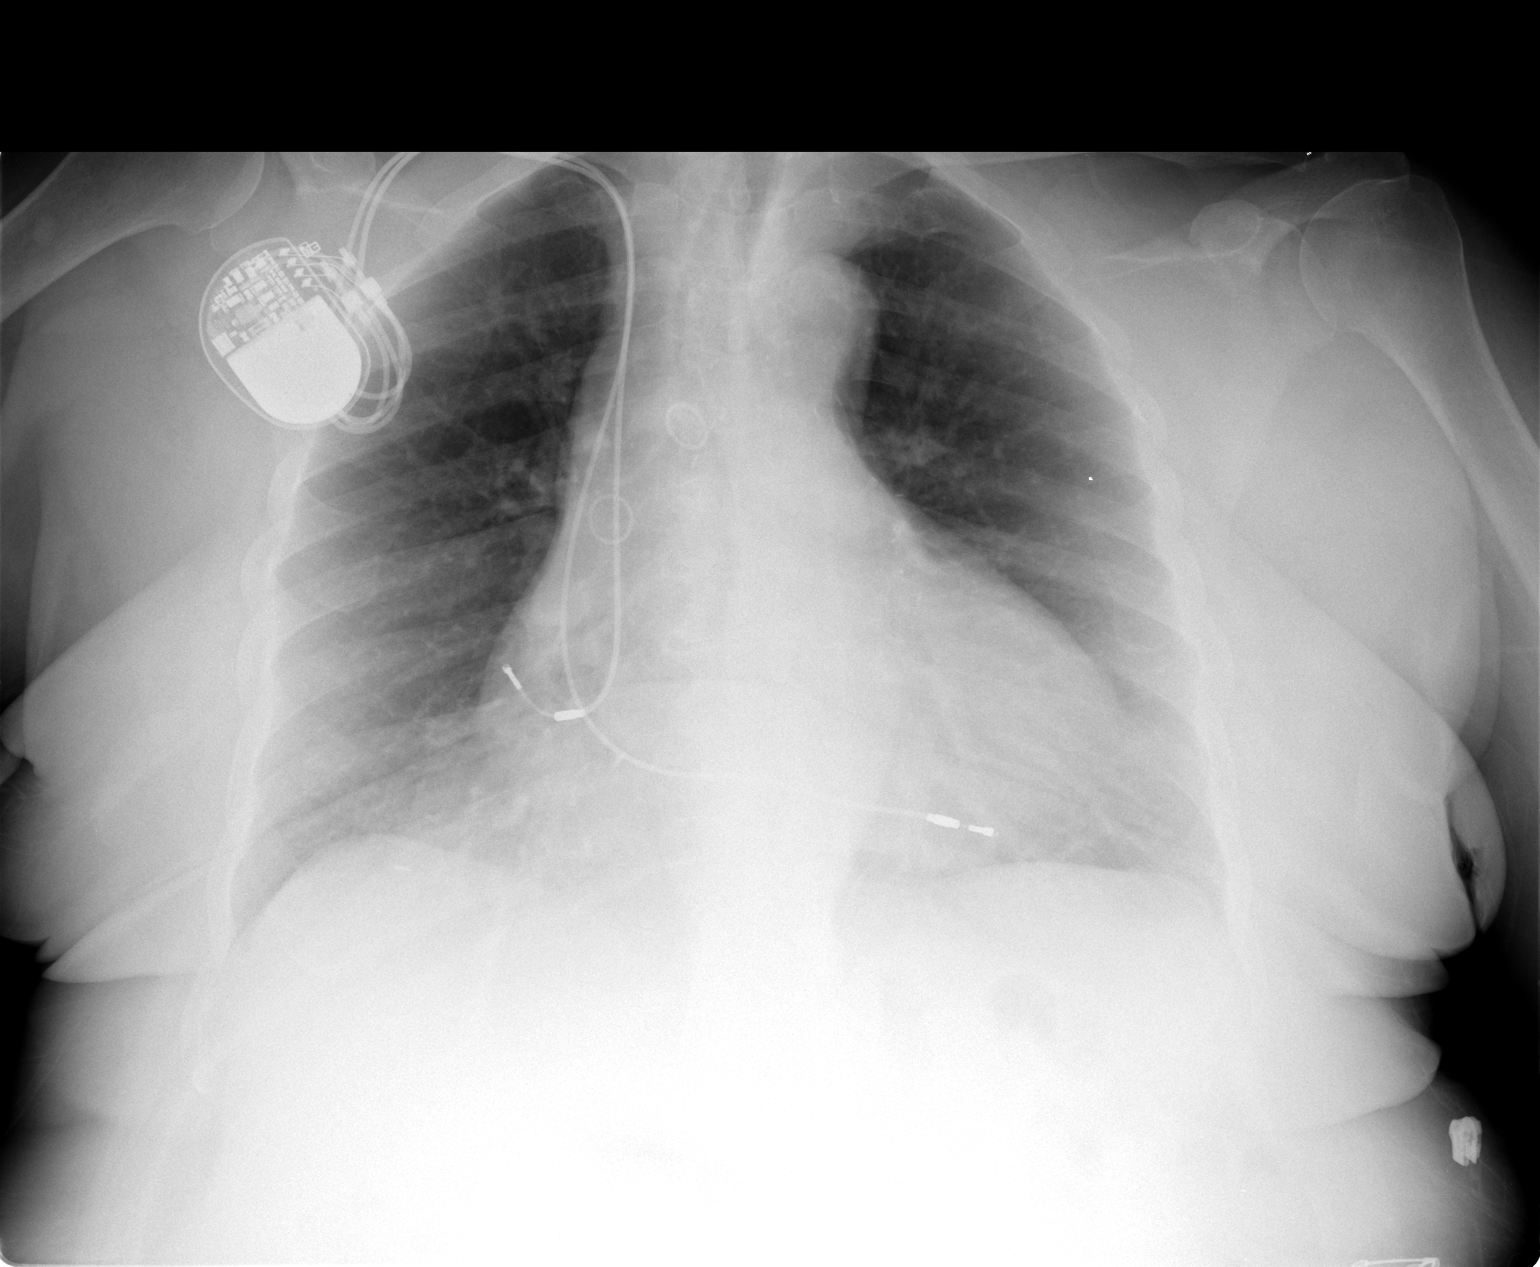

[1 of 1 positions shown; findings below may reference images not displayed]

FINDINGS: Prior CABG.  There is stable cardiomegaly.  Right pacer
remains in place, unchanged.  Lungs are clear.  No effusions or
acute bony abnormality.
IMPRESSION: Cardiomegaly.  No acute findings.

## 2011-07-11 IMAGING — NM NM PULM PERFUSION & VENT (REBREATHING & WASHOUT)
2 series · 12 of 12 positions shown · non-contrast
Comparison: Chest radiograph 01/11/2010

CLINICAL DATA: Chest pain, elevated D-dimer, question pulmonary
embolism

NUCLEAR MEDICINE VENTILATION - PERFUSION LUNG SCAN
TECHNIQUE: Wash-in, equilibrium, and wash-out phase ventilation
images were obtained using Ue-2ZZ gas.  Perfusion images were
obtained in multiple projections after intravenous injection of Tc-
99m MAA.
Radiopharmaceuticals:  8.68 mCi Ue-2ZZ gas and 5.0 mCi Lc-88m MAA.

[Series 1: lung vq · 3.20mm/px · 6 of 16 frames shown (1 of 2)]
[frame 2/16  full-range]
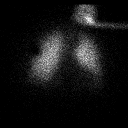
[frame 4/16  full-range]
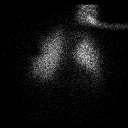
[frame 7/16  full-range]
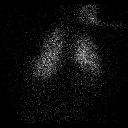
[frame 10/16]
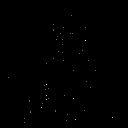
[frame 12/16]
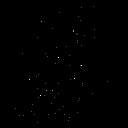
[frame 15/16]
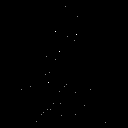

[Series 1: lung vq · 3.20mm/px · 6 of 16 frames shown (2 of 2)]
[frame 2/16  full-range]
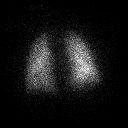
[frame 4/16  full-range]
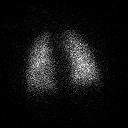
[frame 7/16  full-range]
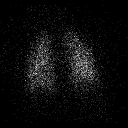
[frame 10/16]
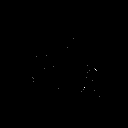
[frame 12/16]
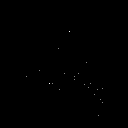
[frame 15/16]
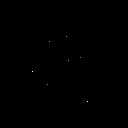

[12 of 12 positions shown; findings below may reference images not displayed]

FINDINGS: Ventilation exam demonstrates photopenic defect in the lateral
aspect of the right upper to mid lung due to attenuation from
pacemaker pack.
Ventilation exam is otherwise normal.
Perfusion lung scan demonstrates attenuation at the left apex on
the lateral view due to superimposition of the patient's arm, with
patient unable to raise left arm completely from field.
Remaining perfusion is normal.
No worrisome segmental or subsegmental perfusion defects
identified.
Findings represent a very low probability pulmonary embolism.
IMPRESSION: Very low probability for pulmonary embolism.

## 2011-07-15 ENCOUNTER — Telehealth: Payer: Self-pay | Admitting: Internal Medicine

## 2011-07-15 NOTE — Telephone Encounter (Signed)
I spoke with the patient. She states that she has had severe chest pain that has lasted about 30 minutes this morning. She has taken 3 NTG tabs with the last one being around 1017. She has a history of CABG. She states that her pain has resolved finally, but she refers to having a feeling of "being in a fog." I have recommended that she go to the ER for evaluation and possible treatment. I have advised her to call [phone] if she is having any pain at all. The patient states she is not going to go now because she has no one to stay with her husband. I re-emphasized my recommendations to the patient. She states she will call her daughter if her pain redevelops.

## 2011-07-15 NOTE — Telephone Encounter (Signed)
Pt calling with severe chest pains

## 2011-07-20 IMAGING — NM NM MYOCAR MULTI W/SPECT W/WALL MOTION & EF
2 series · 12 of 12 positions shown · non-contrast
Comparison: none

nm myoview pharmacologic stress

Ordering Physician: Carolyne Wheat
Olga Physician: [REDACTED]al Data: 68-year-old woman with prior CABG surgery now
presenting with dyspnea and chest discomfort.
NUCLEAR MEDICINE ADENOSINE STRESS MYOVIEW STUDY WITH SPECT AND LEFT
VENTRIUCLAR EJECTION FRACTION
Radionuclide Data: One-day rest/stress protocol performed with
[DATE] mCi of Jc-99m Myoview.
Stress Data: Regadenoson infusion resulted in a modest increase in
heart rate and decrease in systolic blood pressure.  No arrhythmias
noted.
EKG: Atrial pacing at a rate of 63 bpm; leftward axis; delayed R-
wave progression; nonspecific ST-T wave abnormality.  No
significant change following pharmacologic stress.
Scintigraphic Data: Acquisition notable for moderate breast
attenuation.  Left ventricular hypertrophy was present.  The left
ventricular cavity size was normal.  There appear to be surgical
absence of the gallbladder.
On tomographic images reconstructed in standard planes, minimal
thinning of the distal septum was noted, which was of borderline
numeric significance.  No reversibility was apparent by comparison
to the resting portion of the study.  A second minimal defect of
very small size was present in the basilar posterolateral segment.
Substantial reversibility was noted in this region.  The gated
reconstruction demonstrated normal regional and global LV systolic
function as well as normal systolic accentuation of activity
throughout.  Estimated ejection fraction was 62%.

[Series 1: cr cardiac tc low dose · 6.41mm/px · 6 of 64 frames shown]
[frame 6/64]
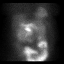
[frame 16/64]
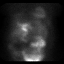
[frame 27/64]
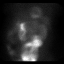
[frame 38/64]
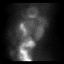
[frame 48/64]
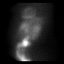
[frame 59/64]
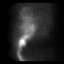

[Series 1: cs cardiac tc hi dose · 6.41mm/px · 6 of 512 frames shown]
[frame 43/512]
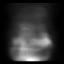
[frame 128/512]
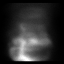
[frame 214/512]
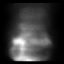
[frame 299/512]
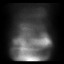
[frame 384/512]
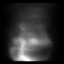
[frame 470/512]
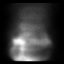

[12 of 12 positions shown; findings below may reference images not displayed]

IMPRESSION: Borderline pharmacologic stress nuclear myocardial study revealing
normal left ventricular size, normal LV systolic function and no
stress induced EKG abnormalities.  By scintigraphic imaging, there
was a minimal persistent septal defect consistent with breast
attenuation artifact.  A minimal posterolateral defect with
reversibility could reflect variable soft tissue attenuation, but
the possibility of mild ischemia in this region certainly needs to
be considered.  Other findings as noted.

## 2011-07-21 ENCOUNTER — Ambulatory Visit (INDEPENDENT_AMBULATORY_CARE_PROVIDER_SITE_OTHER): Payer: PRIVATE HEALTH INSURANCE | Admitting: Otolaryngology

## 2011-07-21 DIAGNOSIS — K121 Other forms of stomatitis: Secondary | ICD-10-CM

## 2011-07-22 ENCOUNTER — Other Ambulatory Visit (HOSPITAL_COMMUNITY): Payer: Self-pay | Admitting: Pulmonary Disease

## 2011-07-22 DIAGNOSIS — Z139 Encounter for screening, unspecified: Secondary | ICD-10-CM

## 2011-08-10 ENCOUNTER — Ambulatory Visit: Payer: PRIVATE HEALTH INSURANCE | Admitting: Physician Assistant

## 2011-08-11 ENCOUNTER — Ambulatory Visit (INDEPENDENT_AMBULATORY_CARE_PROVIDER_SITE_OTHER): Payer: PRIVATE HEALTH INSURANCE | Admitting: Otolaryngology

## 2011-08-11 ENCOUNTER — Telehealth: Payer: Self-pay | Admitting: Nurse Practitioner

## 2011-08-11 NOTE — Telephone Encounter (Signed)
Pt called this evening stating that she is quite sob today and that her weight increased from 241 lbs yesterday to 261 lbs today.  She denies c/p.  i recommended that she present to the ER for evaluation however she then said that she's not that bad off and will come only if things worsen.

## 2011-08-21 IMAGING — CR DG CHEST 2V
2 series · 2 of 2 positions shown · non-contrast
Comparison: 01/11/2010

CLINICAL DATA: Shortness of breath

CHEST - 2 VIEW

[view not recorded (1 of 2)]
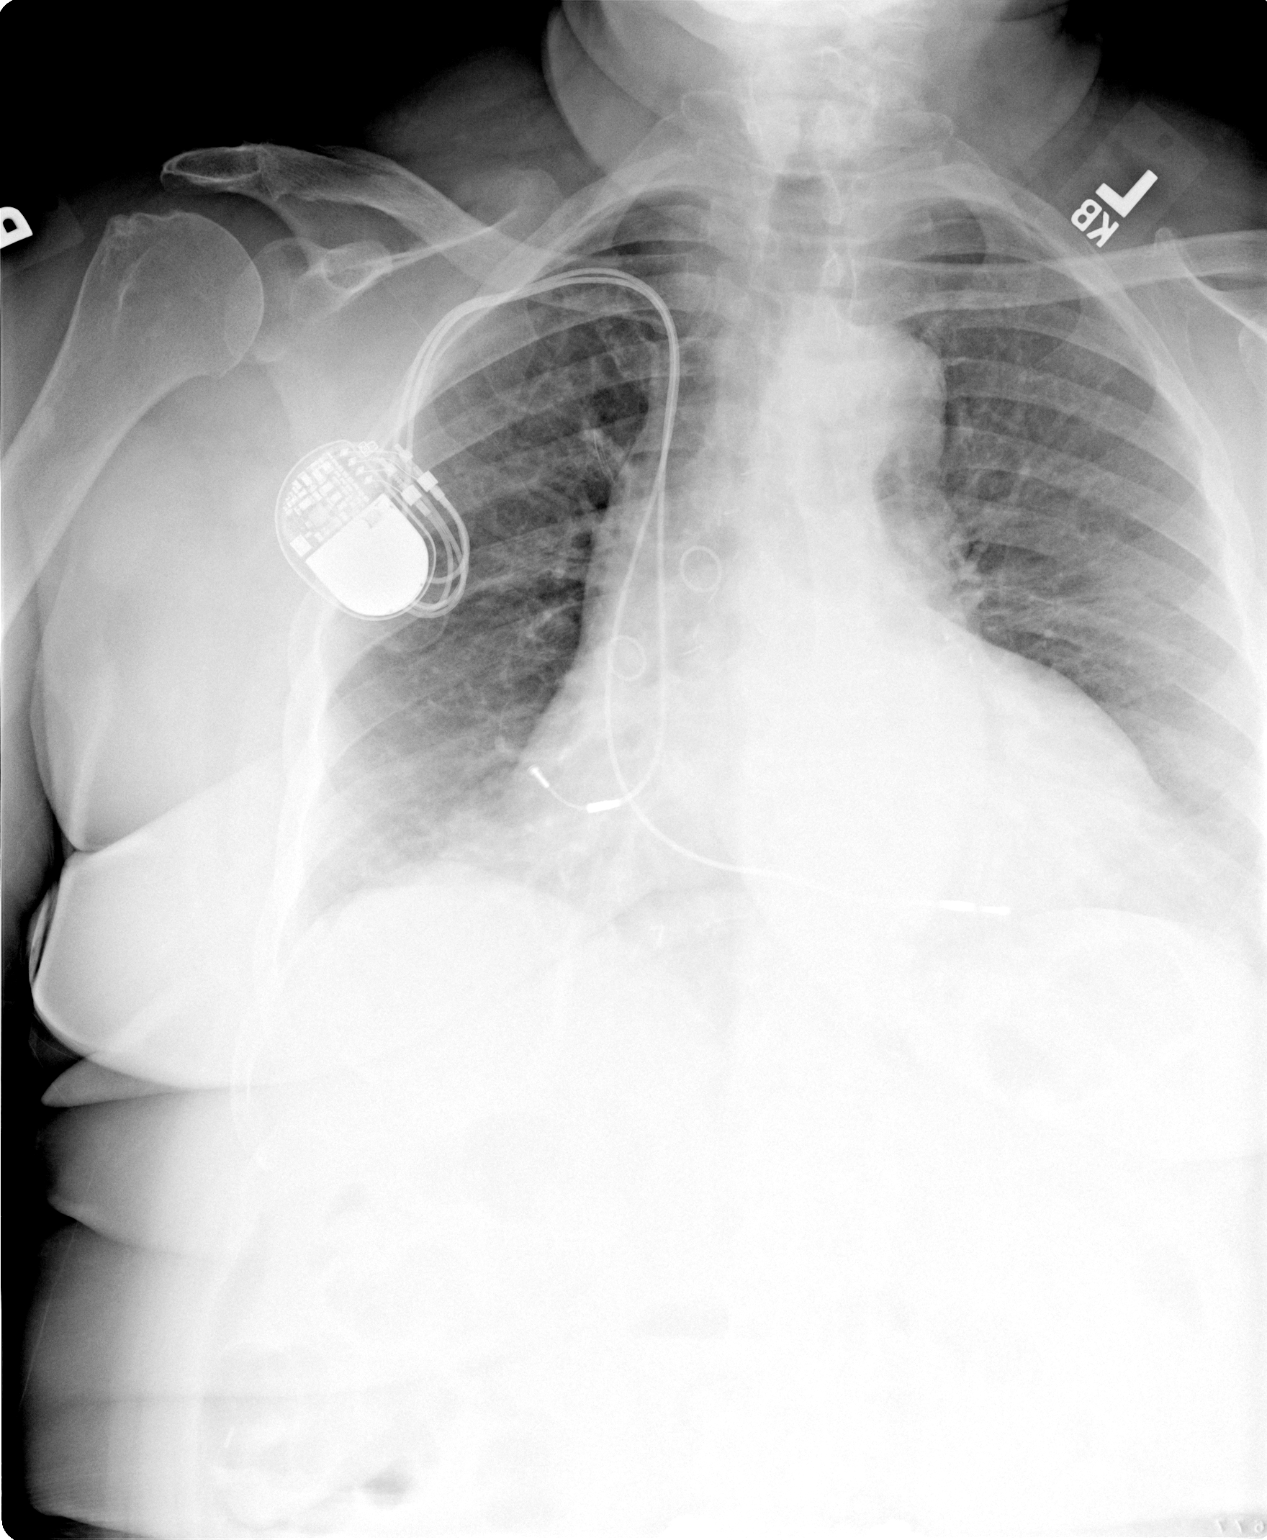

[view not recorded (2 of 2)]
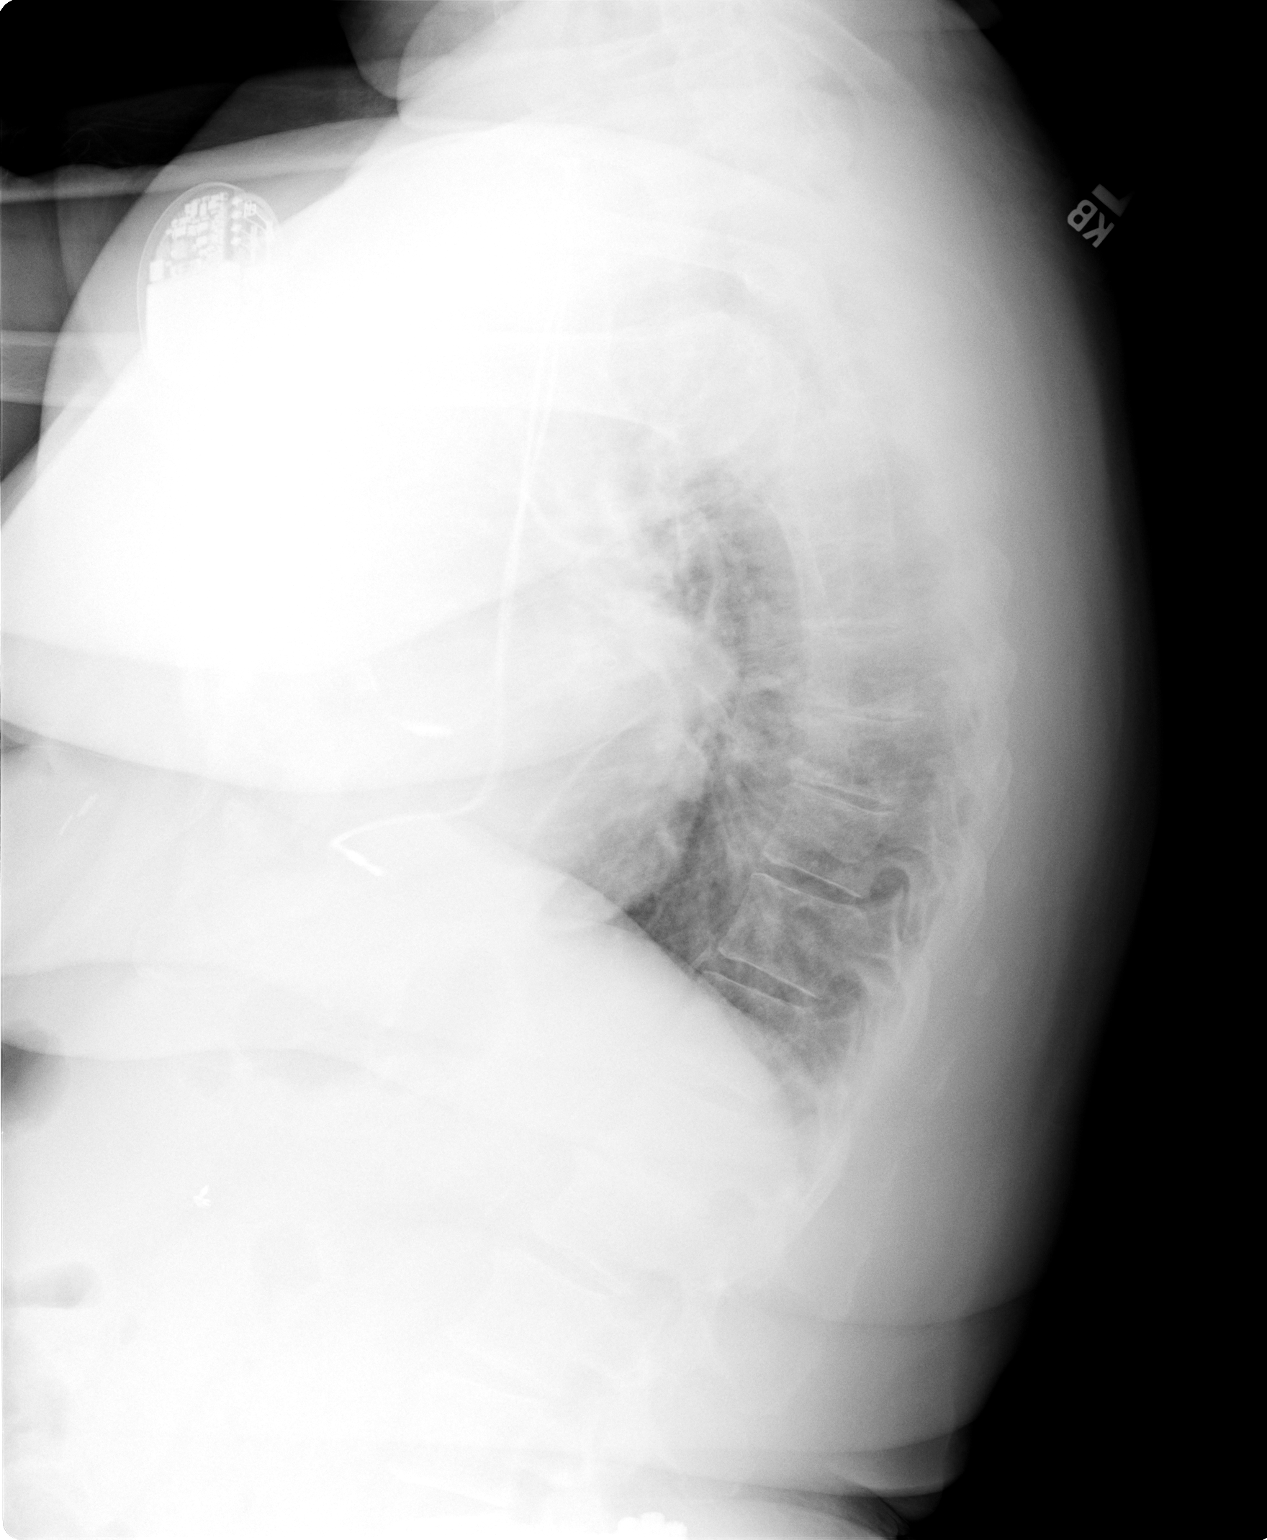

[2 of 2 positions shown; findings below may reference images not displayed]

FINDINGS: Cardiomegaly again noted.  Dual lead cardiac pacemaker is
unchanged in position.  No acute infiltrate or pleural effusion.
No pulmonary edema.  Mild degenerative changes mid thoracic spine.
IMPRESSION: No active disease.  Mild degenerative changes mid thoracic spine.

## 2011-08-24 LAB — PHENYTOIN LEVEL, TOTAL
Phenytoin Lvl: <2.5 — ABNORMAL LOW
Phenytoin Lvl: <2.5 — ABNORMAL LOW

## 2011-08-24 LAB — CARDIAC PANEL(CRET KIN+CKTOT+MB+TROPI)
CK, MB: 2.7
CK, MB: 3.4
Relative Index: 1.2
Total CK: 305 — ABNORMAL HIGH
Troponin I: 0.09 — ABNORMAL HIGH

## 2011-08-24 LAB — COMPREHENSIVE METABOLIC PANEL
AST: 62 — ABNORMAL HIGH
CO2: 27
Calcium: 9.2
Creatinine, Ser: 1.2
GFR calc Af Amer: 54 — ABNORMAL LOW
GFR calc non Af Amer: 45 — ABNORMAL LOW
Total Protein: 7.9

## 2011-08-24 LAB — CBC
MCHC: 33
MCV: 89.6
RBC: 4.65
RDW: 13.7

## 2011-08-24 LAB — URINALYSIS, ROUTINE W REFLEX MICROSCOPIC
Bilirubin Urine: NEGATIVE
Glucose, UA: NEGATIVE
Ketones, ur: NEGATIVE
Leukocytes, UA: NEGATIVE
Protein, ur: >300 — AB

## 2011-08-24 LAB — LIPASE, BLOOD: Lipase: 34

## 2011-08-24 LAB — URINE MICROSCOPIC-ADD ON

## 2011-08-24 LAB — D-DIMER, QUANTITATIVE: D-Dimer, Quant: 2.49 — ABNORMAL HIGH

## 2011-08-24 LAB — BASIC METABOLIC PANEL
Calcium: 10.1
GFR calc Af Amer: >60
GFR calc non Af Amer: >60
Glucose, Bld: 154 — ABNORMAL HIGH
Potassium: 3.2 — ABNORMAL LOW
Sodium: 139

## 2011-08-24 LAB — DIFFERENTIAL
Eosinophils Relative: 0
Lymphocytes Relative: 11 — ABNORMAL LOW
Lymphs Abs: 1.6
Neutrophils Relative %: 80 — ABNORMAL HIGH

## 2011-08-24 LAB — PROTIME-INR: Prothrombin Time: 14.3

## 2011-08-29 ENCOUNTER — Ambulatory Visit (HOSPITAL_COMMUNITY): Payer: PRIVATE HEALTH INSURANCE

## 2011-09-06 LAB — BASIC METABOLIC PANEL
BUN: 10
Calcium: 9.5
Creatinine, Ser: 0.72
GFR calc non Af Amer: >60
Glucose, Bld: 82

## 2011-09-06 LAB — CBC
HCT: 35.7 — ABNORMAL LOW
HCT: 38.4
MCV: 89.8
Platelets: 219
Platelets: 300
RDW: 13.9
WBC: 15 — ABNORMAL HIGH

## 2011-09-06 LAB — URINE MICROSCOPIC-ADD ON

## 2011-09-06 LAB — LIPASE, BLOOD: Lipase: 19

## 2011-09-06 LAB — COMPREHENSIVE METABOLIC PANEL
Albumin: 3.6
BUN: 9
CO2: 24
Chloride: 104
Creatinine, Ser: 0.78
GFR calc non Af Amer: >60
Glucose, Bld: 206 — ABNORMAL HIGH
Total Bilirubin: 0.7

## 2011-09-06 LAB — DIFFERENTIAL
Basophils Absolute: 0
Basophils Absolute: 0
Eosinophils Relative: 3
Lymphocytes Relative: 32
Lymphocytes Relative: 4 — ABNORMAL LOW
Neutro Abs: 13.6 — ABNORMAL HIGH
Neutro Abs: 4.5
Neutrophils Relative %: 91 — ABNORMAL HIGH

## 2011-09-06 LAB — PROTIME-INR
INR: 1.1
Prothrombin Time: 13.4
Prothrombin Time: 14.2

## 2011-09-06 LAB — URINALYSIS, ROUTINE W REFLEX MICROSCOPIC
Bilirubin Urine: NEGATIVE
Glucose, UA: NEGATIVE
Hgb urine dipstick: NEGATIVE
Protein, ur: 100 — AB
Urobilinogen, UA: 0.2

## 2011-09-06 LAB — TYPE AND SCREEN: ABO/RH(D): O NEG

## 2011-09-06 LAB — PHENYTOIN LEVEL, TOTAL: Phenytoin Lvl: <2.5 — ABNORMAL LOW

## 2011-09-08 LAB — BASIC METABOLIC PANEL
BUN: 10 mg/dL (ref 6–23)
CO2: 20 mEq/L (ref 19–32)
Calcium: 9 mg/dL (ref 8.4–10.5)
Creatinine, Ser: 0.62 mg/dL (ref 0.4–1.2)
GFR calc non Af Amer: >60 mL/min (ref >60)
Glucose, Bld: 85 mg/dL (ref 70–99)
Sodium: 137 mEq/L (ref 135–145)

## 2011-09-08 LAB — DIFFERENTIAL
Basophils Absolute: 0 10*3/uL (ref 0.0–0.1)
Basophils Relative: 0 % (ref 0–1)
Monocytes Absolute: 0.6 10*3/uL (ref 0.1–1.0)
Neutro Abs: 8.9 10*3/uL — ABNORMAL HIGH (ref 1.7–7.7)

## 2011-09-08 LAB — CBC
Hemoglobin: 10.2 g/dL — ABNORMAL LOW (ref 12.0–15.0)
MCHC: 33.1 g/dL (ref 30.0–36.0)
Platelets: 268 10*3/uL (ref 150–400)
RDW: 13.9 % (ref 11.5–15.5)

## 2011-09-27 ENCOUNTER — Encounter: Payer: Self-pay | Admitting: Physician Assistant

## 2011-10-04 IMAGING — CR DG CHEST 1V PORT
1 series · 1 of 1 positions shown · non-contrast
Comparison: 02/22/2010

CLINICAL DATA: Central line placement.

PORTABLE CHEST - 1 VIEW

[view not recorded]
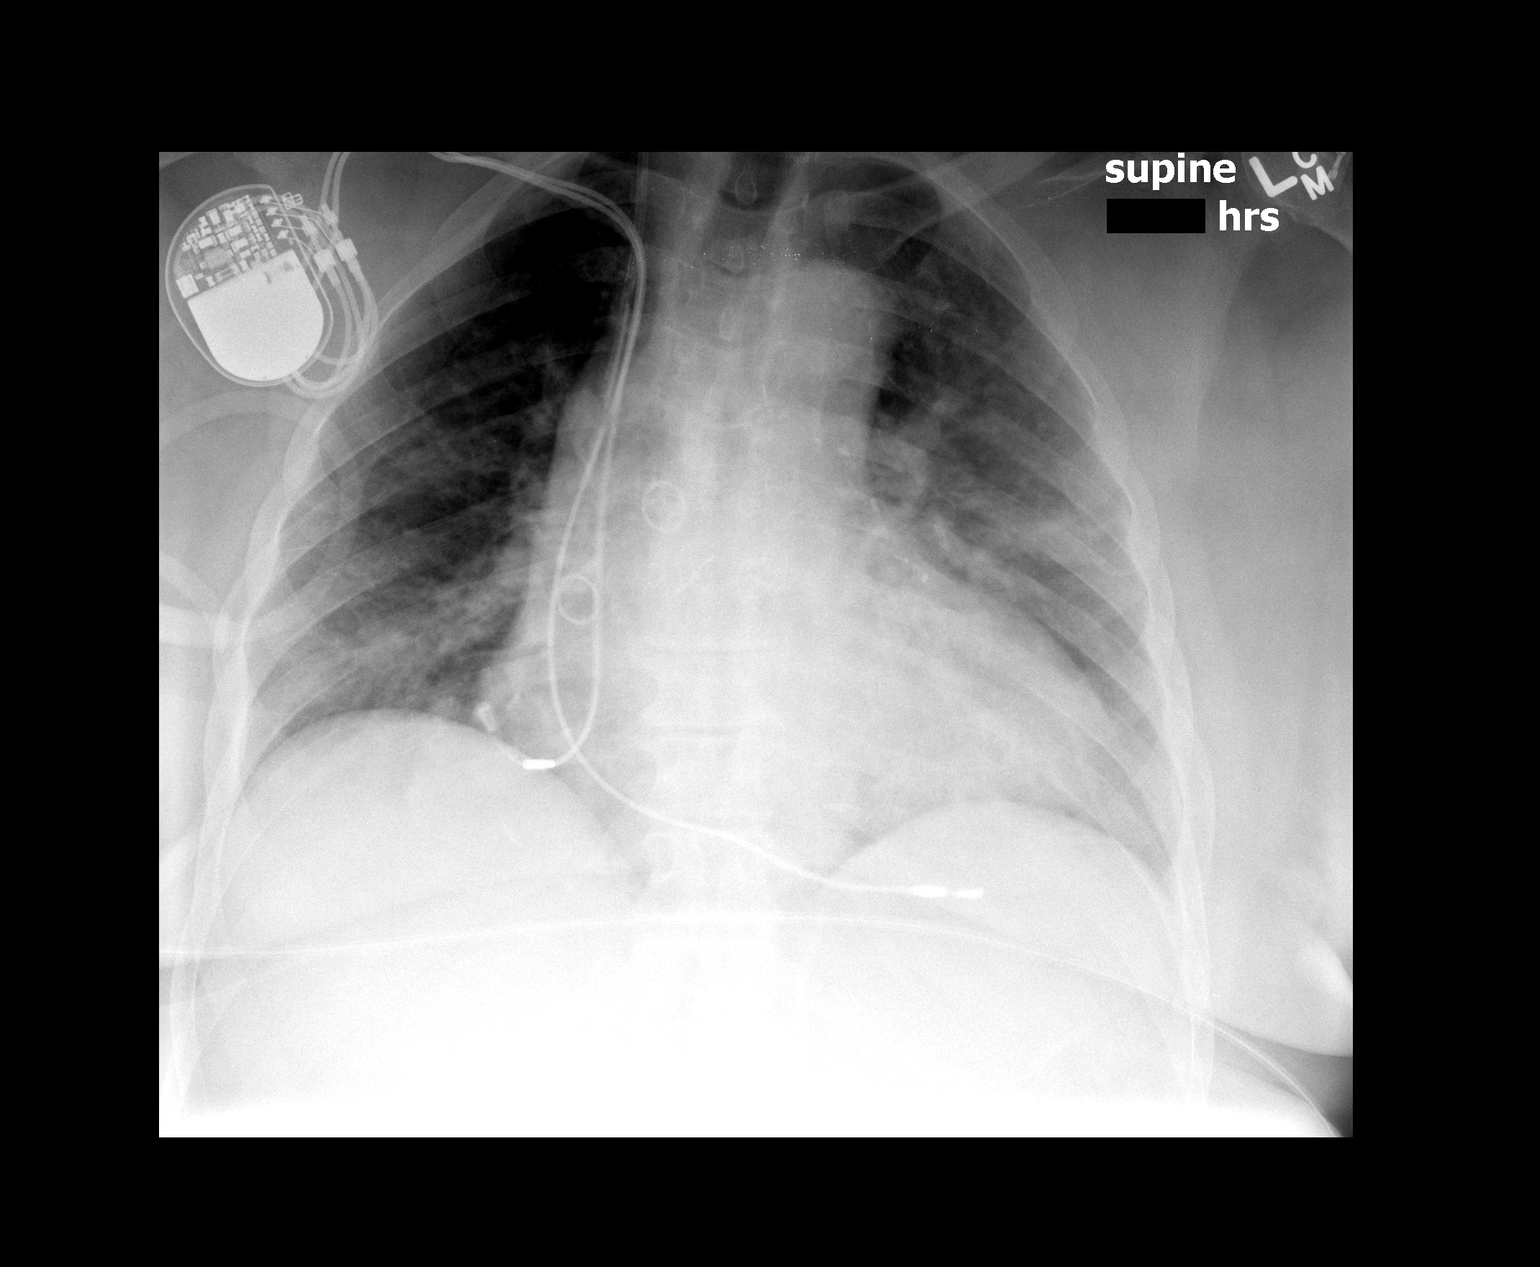

[1 of 1 positions shown; findings below may reference images not displayed]

FINDINGS: Right internal jugular central line has been placed.  The
tip is in the SVC.  No pneumothorax.

There is cardiomegaly with vascular congestion and mild edema.
Bibasilar atelectasis.  No effusions.  Right pacer is unchanged.
IMPRESSION: Right central line placement with the tip in the SVC.  No
pneumothorax.

Cardiomegaly.  Mild edema pattern.  Bibasilar atelectasis.

## 2011-10-04 IMAGING — RF DG LUMBAR SPINE 2-3V
1 series · 2 of 2 positions shown · non-contrast
Comparison: CT myelogram 12/28/2009

CLINICAL DATA: Low back pain

LUMBAR SPINE - 2-3 VIEW

[Series 1: run · 2 of 2 slices shown]
[im 1/2]
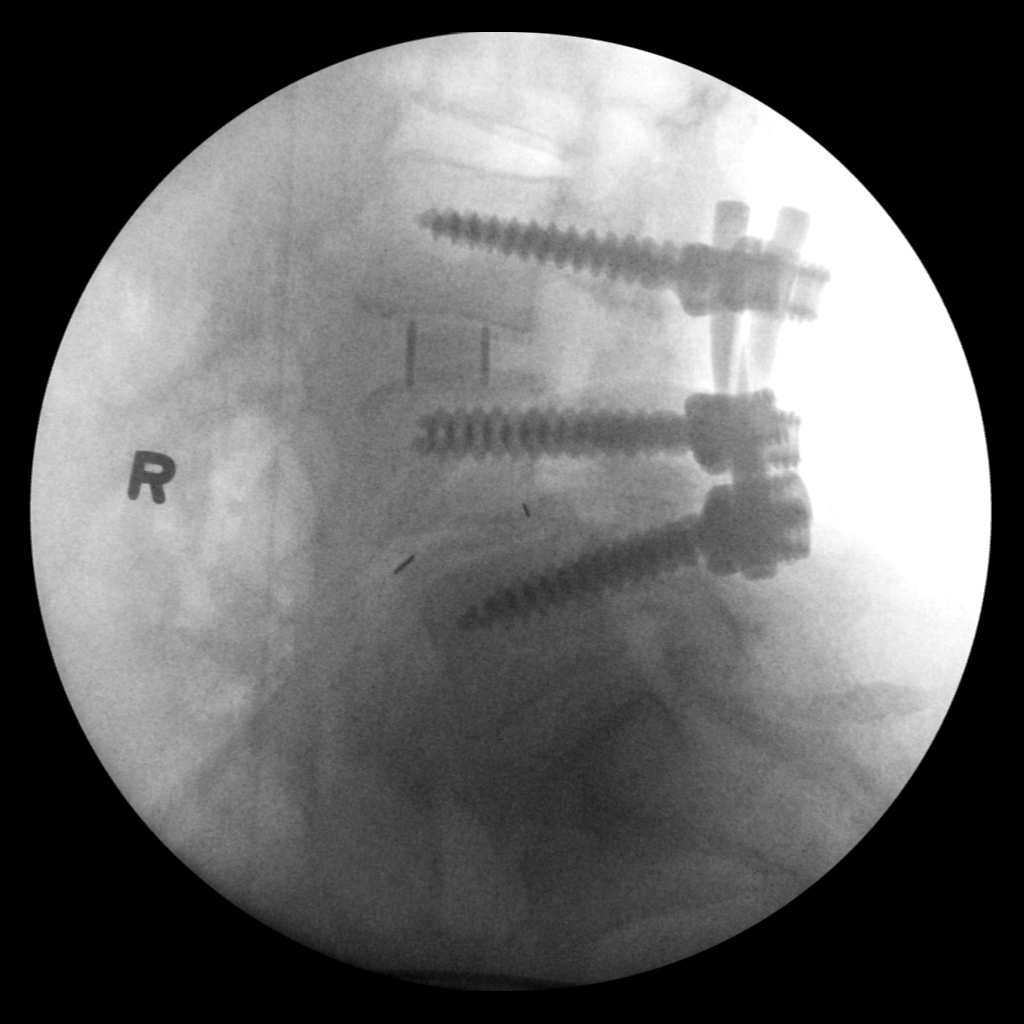
[im 2/2]
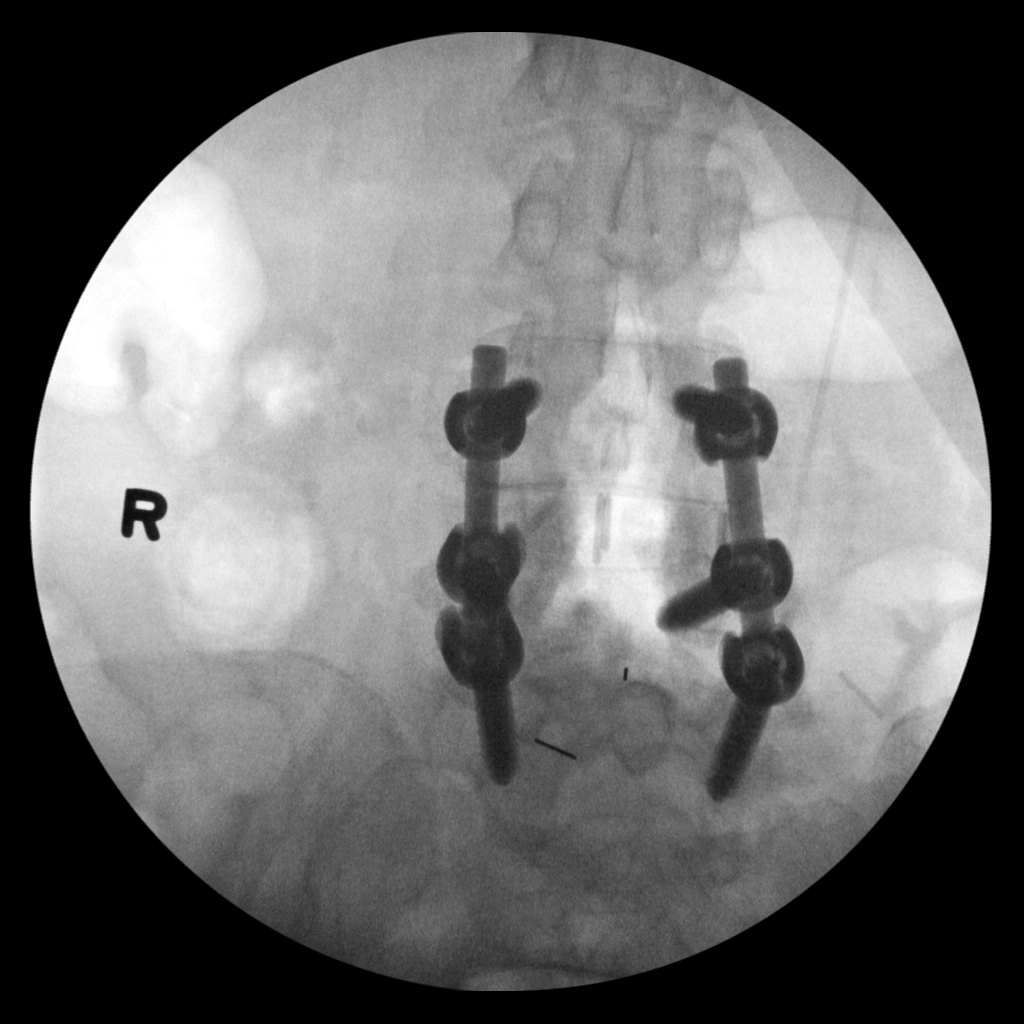

[2 of 2 positions shown; findings below may reference images not displayed]

FINDINGS: Previous L4-5 fusion has been extended upward to include
L3-4.  XLIF procedure with interbody spacer appears satisfactorily
positioned.  Pedicle screw and rod construct at L3-4 is continuous
with L4-L5.  Hardware grossly intact.
IMPRESSION: As above.

## 2011-10-26 IMAGING — CR DG ABDOMEN ACUTE W/ 1V CHEST
3 series · 3 of 3 positions shown · non-contrast
Comparison: CT of the abdomen and pelvis performed 12/28/2009, and
chest radiograph performed 04/07/2010

CLINICAL DATA: Nausea, vomiting and diarrhea for 12 hours.

ACUTE ABDOMEN SERIES (ABDOMEN 2 VIEW & CHEST 1 VIEW)

[view not recorded (1 of 3)]
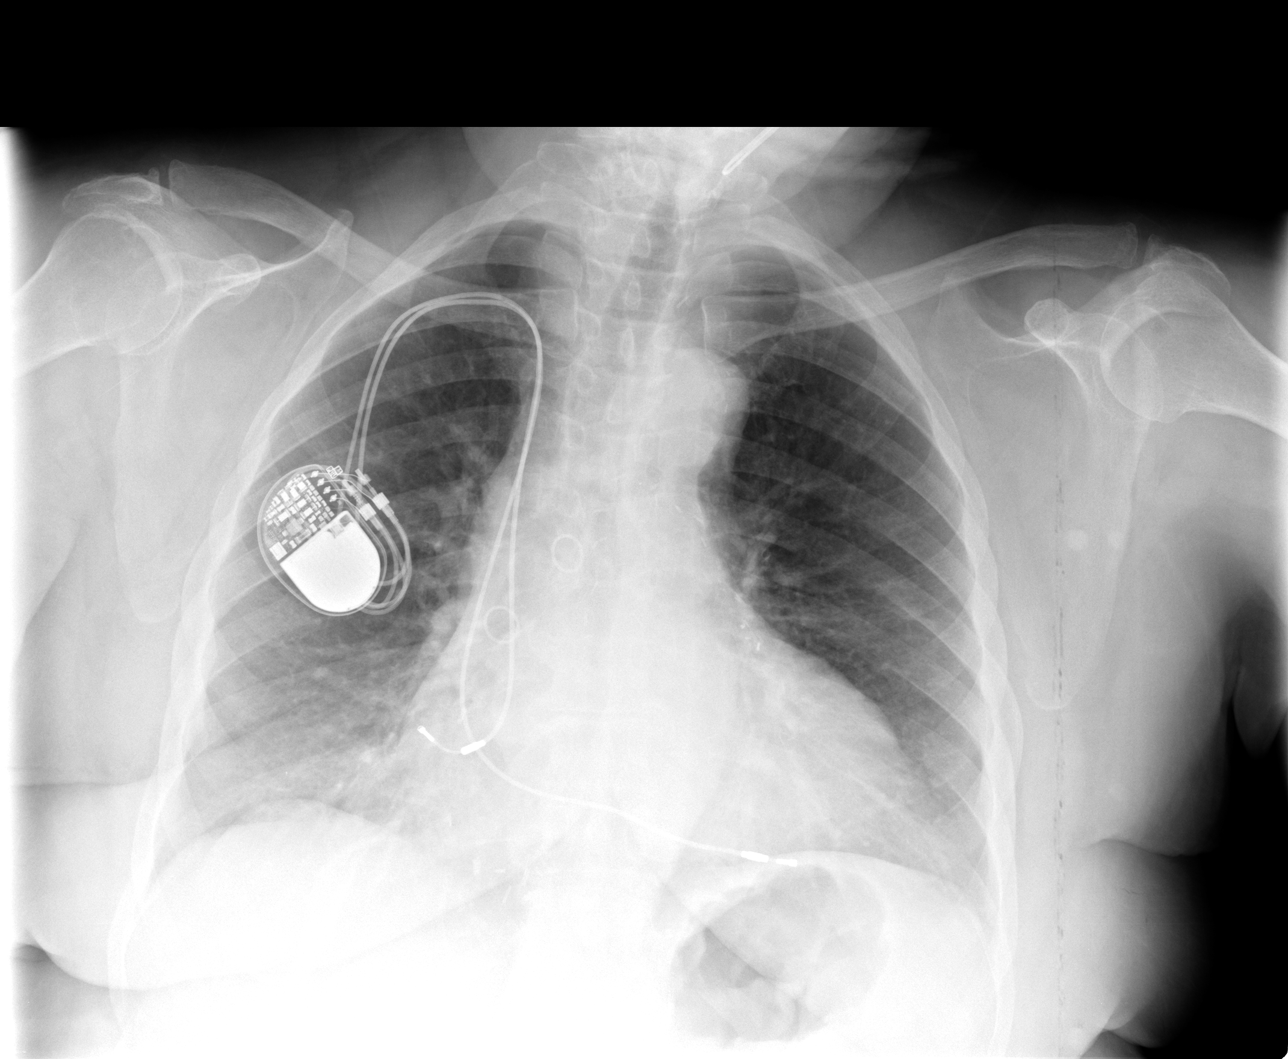

[view not recorded (2 of 3)]
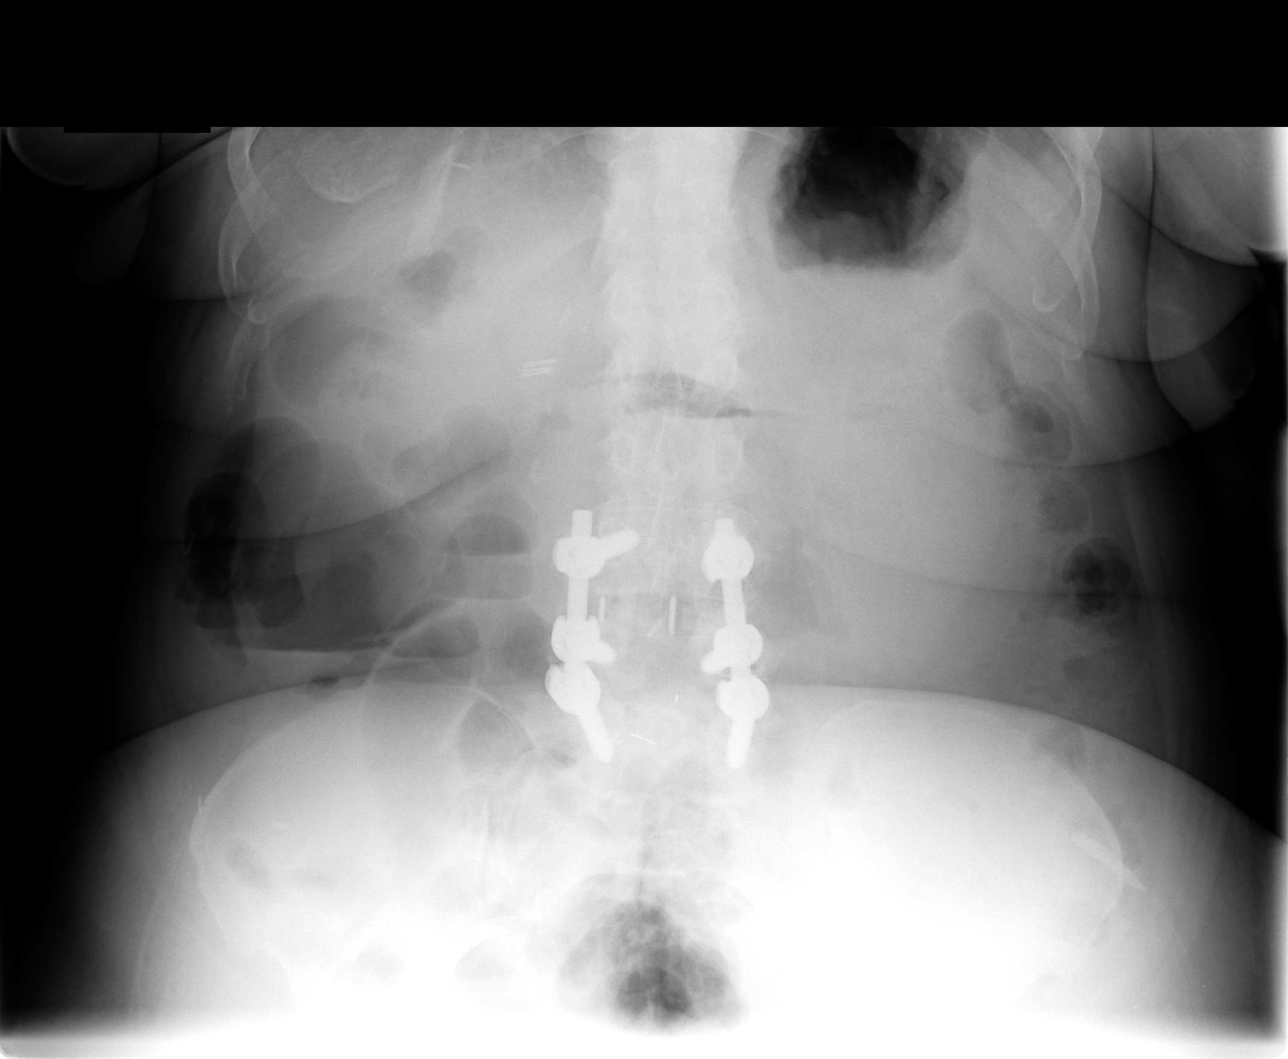

[view not recorded (3 of 3)]
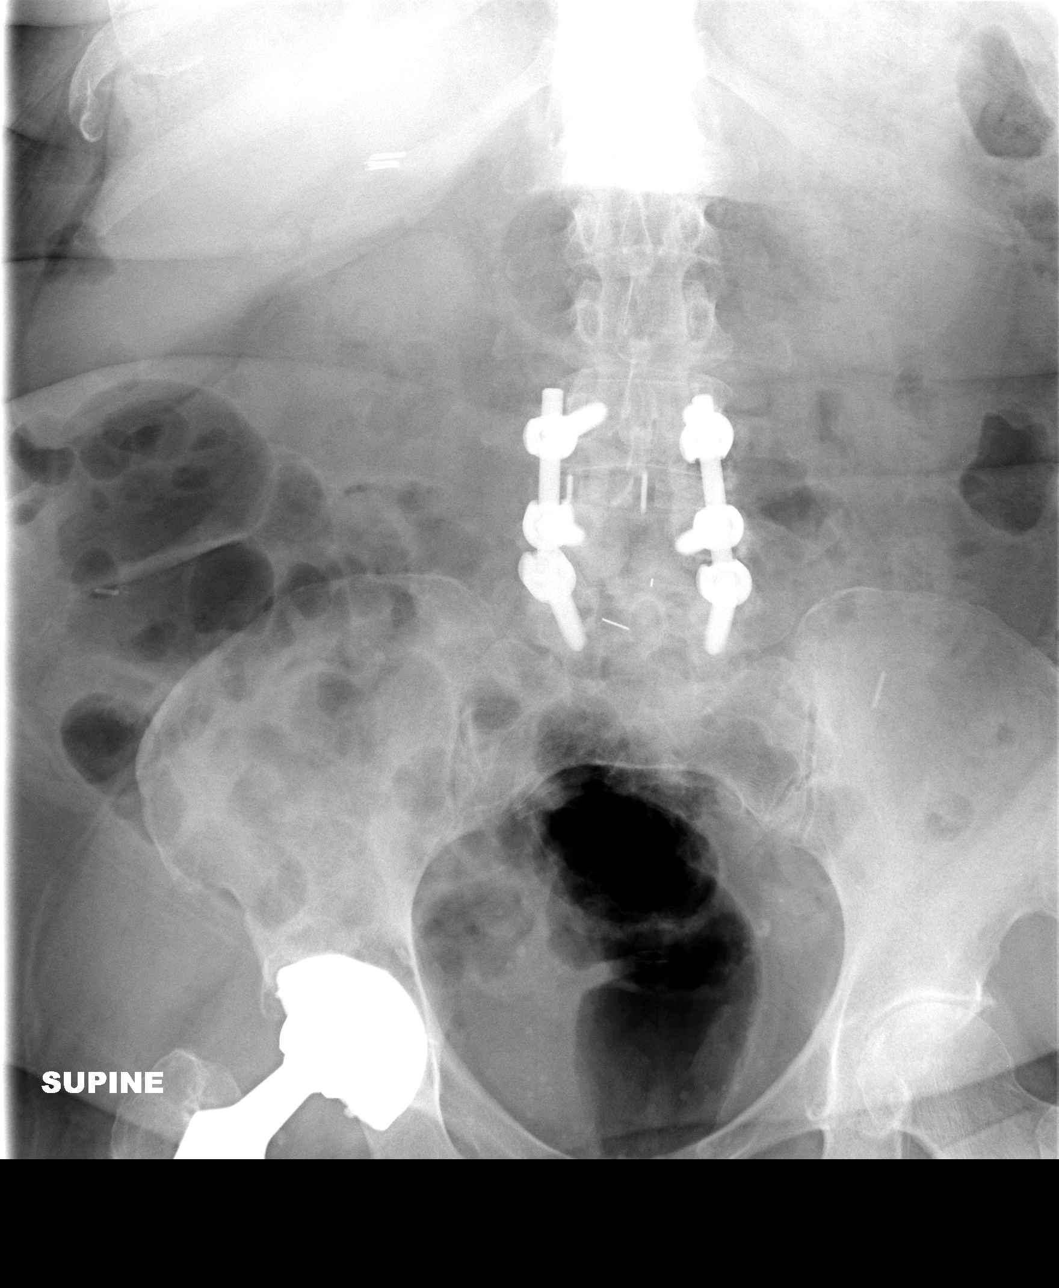

[3 of 3 positions shown; findings below may reference images not displayed]

FINDINGS: The lungs are well-aerated.  There is no evidence of
focal opacification, pleural effusion or pneumothorax.  Mild
chronically increased interstitial markings are noted.  The
cardiomediastinal silhouette is mildly enlarged; there is evidence
of prior CABG. A pacemaker is noted overlying the right chest wall,
with leads ending overlying the right atrium and right ventricle.

The visualized bowel gas pattern is unremarkable. Scattered air-
filled loops of small and large bowel are noted; there is no
evidence of small bowel dilatation to suggest obstruction.  No free
intra-abdominal air is identified on the provided upright view.
Clips are noted within the right upper quadrant, reflecting prior
cholecystectomy.

No acute osseous abnormalities are seen; the sacroiliac joints are
unremarkable in appearance. The patient is status post lumbar
spinal rod fusion, with an associated spacer.  A right total hip
arthroplasty is noted, without evidence of loosening or fracture.
IMPRESSION: 1.  Unremarkable bowel gas pattern; no free intra-abdominal air
seen.
2.  No acute cardiopulmonary process identified; mild stable
cardiomegaly.

## 2011-11-07 ENCOUNTER — Encounter: Payer: Self-pay | Admitting: Physician Assistant

## 2011-11-18 ENCOUNTER — Inpatient Hospital Stay (HOSPITAL_COMMUNITY): Payer: PRIVATE HEALTH INSURANCE

## 2011-11-18 ENCOUNTER — Ambulatory Visit (INDEPENDENT_AMBULATORY_CARE_PROVIDER_SITE_OTHER): Payer: PRIVATE HEALTH INSURANCE | Admitting: Physician Assistant

## 2011-11-18 ENCOUNTER — Inpatient Hospital Stay (HOSPITAL_COMMUNITY)
Admission: AD | Admit: 2011-11-18 | Discharge: 2011-11-21 | DRG: 313 | Disposition: A | Discharge status: Home / Self Care | Payer: PRIVATE HEALTH INSURANCE | Source: Ambulatory Visit | Attending: Cardiology | Admitting: Cardiology

## 2011-11-18 ENCOUNTER — Encounter: Payer: Self-pay | Admitting: Physician Assistant

## 2011-11-18 ENCOUNTER — Encounter (HOSPITAL_COMMUNITY): Payer: Self-pay | Admitting: General Practice

## 2011-11-18 VITALS — BP 122/70 | HR 73 | Ht 62.0 in | Wt 251.0 lb

## 2011-11-18 DIAGNOSIS — Z886 Allergy status to analgesic agent status: Secondary | ICD-10-CM

## 2011-11-18 DIAGNOSIS — J449 Chronic obstructive pulmonary disease, unspecified: Secondary | ICD-10-CM | POA: Diagnosis present

## 2011-11-18 DIAGNOSIS — I10 Essential (primary) hypertension: Secondary | ICD-10-CM | POA: Diagnosis present

## 2011-11-18 DIAGNOSIS — E119 Type 2 diabetes mellitus without complications: Secondary | ICD-10-CM | POA: Diagnosis present

## 2011-11-18 DIAGNOSIS — Z86718 Personal history of other venous thrombosis and embolism: Secondary | ICD-10-CM

## 2011-11-18 DIAGNOSIS — R0602 Shortness of breath: Secondary | ICD-10-CM

## 2011-11-18 DIAGNOSIS — R079 Chest pain, unspecified: Secondary | ICD-10-CM

## 2011-11-18 DIAGNOSIS — I5032 Chronic diastolic (congestive) heart failure: Secondary | ICD-10-CM

## 2011-11-18 DIAGNOSIS — I509 Heart failure, unspecified: Secondary | ICD-10-CM

## 2011-11-18 DIAGNOSIS — K259 Gastric ulcer, unspecified as acute or chronic, without hemorrhage or perforation: Secondary | ICD-10-CM

## 2011-11-18 DIAGNOSIS — E785 Hyperlipidemia, unspecified: Secondary | ICD-10-CM | POA: Diagnosis present

## 2011-11-18 DIAGNOSIS — J4489 Other specified chronic obstructive pulmonary disease: Secondary | ICD-10-CM | POA: Diagnosis present

## 2011-11-18 DIAGNOSIS — Z950 Presence of cardiac pacemaker: Secondary | ICD-10-CM

## 2011-11-18 DIAGNOSIS — R0789 Other chest pain: Principal | ICD-10-CM | POA: Diagnosis present

## 2011-11-18 DIAGNOSIS — I5033 Acute on chronic diastolic (congestive) heart failure: Secondary | ICD-10-CM | POA: Diagnosis present

## 2011-11-18 DIAGNOSIS — G40909 Epilepsy, unspecified, not intractable, without status epilepticus: Secondary | ICD-10-CM | POA: Diagnosis present

## 2011-11-18 DIAGNOSIS — K219 Gastro-esophageal reflux disease without esophagitis: Secondary | ICD-10-CM | POA: Diagnosis present

## 2011-11-18 DIAGNOSIS — Z79899 Other long term (current) drug therapy: Secondary | ICD-10-CM

## 2011-11-18 DIAGNOSIS — E669 Obesity, unspecified: Secondary | ICD-10-CM | POA: Diagnosis present

## 2011-11-18 DIAGNOSIS — I2510 Atherosclerotic heart disease of native coronary artery without angina pectoris: Secondary | ICD-10-CM | POA: Diagnosis present

## 2011-11-18 HISTORY — DX: Sleep apnea, unspecified: G47.30

## 2011-11-18 LAB — DIFFERENTIAL
Basophils Absolute: 0.1 10*3/uL (ref 0.0–0.1)
Eosinophils Relative: 2 % (ref 0–5)
Lymphocytes Relative: 23 % (ref 12–46)
Neutrophils Relative %: 67 % (ref 43–77)

## 2011-11-18 LAB — D-DIMER, QUANTITATIVE: D-Dimer, Quant: 1.25 ug/mL-FEU — ABNORMAL HIGH (ref 0.00–0.48)

## 2011-11-18 LAB — CARDIAC PANEL(CRET KIN+CKTOT+MB+TROPI)
CK, MB: 2.3 ng/mL (ref 0.3–4.0)
Relative Index: INVALID (ref 0.0–2.5)
Relative Index: INVALID (ref 0.0–2.5)
Troponin I: <0.3 ng/mL (ref <0.30)
Troponin I: <0.3 ng/mL (ref <0.30)

## 2011-11-18 LAB — COMPREHENSIVE METABOLIC PANEL
ALT: 21 U/L (ref 0–35)
BUN: 19 mg/dL (ref 6–23)
CO2: 24 mEq/L (ref 19–32)
Calcium: 9.4 mg/dL (ref 8.4–10.5)
Creatinine, Ser: 0.76 mg/dL (ref 0.50–1.10)
GFR calc Af Amer: >90 mL/min (ref >90)
GFR calc non Af Amer: 82 mL/min — ABNORMAL LOW (ref >90)
Glucose, Bld: 79 mg/dL (ref 70–99)

## 2011-11-18 LAB — PROTIME-INR
INR: 1.07 (ref 0.00–1.49)
Prothrombin Time: 14.1 seconds (ref 11.6–15.2)

## 2011-11-18 LAB — CBC
HCT: 32.3 % — ABNORMAL LOW (ref 36.0–46.0)
Hemoglobin: 10.1 g/dL — ABNORMAL LOW (ref 12.0–15.0)
MCH: 28.5 pg (ref 26.0–34.0)
MCHC: 31.3 g/dL (ref 30.0–36.0)
MCV: 91 fL (ref 78.0–100.0)
RBC: 3.55 MIL/uL — ABNORMAL LOW (ref 3.87–5.11)

## 2011-11-18 LAB — GLUCOSE, CAPILLARY

## 2011-11-18 LAB — HEPARIN LEVEL (UNFRACTIONATED): Heparin Unfractionated: 0.35 IU/mL (ref 0.30–0.70)

## 2011-11-18 MED ORDER — ZOLPIDEM TARTRATE 5 MG PO TABS
5.0000 mg | ORAL_TABLET | Freq: Every evening | ORAL | Status: DC | PRN
  Administered 2011-11-18 – 2011-11-21 (×2): 5 mg via ORAL
  Filled 2011-11-18 (×2): qty 1

## 2011-11-18 MED ORDER — SODIUM CHLORIDE 0.9 % IJ SOLN: 3.0000 mL | INTRAMUSCULAR | Status: DC | PRN

## 2011-11-18 MED ORDER — GABAPENTIN 100 MG PO CAPS
100.0000 mg | ORAL_CAPSULE | Freq: Three times a day (TID) | ORAL | Status: DC
  Administered 2011-11-18 – 2011-11-21 (×9): 100 mg via ORAL
  Filled 2011-11-18 (×11): qty 1

## 2011-11-18 MED ORDER — NITROGLYCERIN 2 % TD OINT
0.5000 [in_us] | TOPICAL_OINTMENT | Freq: Four times a day (QID) | TRANSDERMAL | Status: DC
  Administered 2011-11-18 – 2011-11-21 (×11): 0.5 [in_us] via TOPICAL
  Filled 2011-11-18: qty 30

## 2011-11-18 MED ORDER — ESTRADIOL 1 MG PO TABS
0.5000 mg | ORAL_TABLET | Freq: Every day | ORAL | Status: DC
  Administered 2011-11-18 – 2011-11-19 (×2): via ORAL
  Administered 2011-11-20 – 2011-11-21 (×2): 0.5 mg via ORAL
  Filled 2011-11-18 (×4): qty 0.5

## 2011-11-18 MED ORDER — PHENYTOIN SODIUM EXTENDED 100 MG PO CAPS
300.0000 mg | ORAL_CAPSULE | Freq: Every day | ORAL | Status: DC
  Administered 2011-11-18 – 2011-11-21 (×4): 300 mg via ORAL
  Filled 2011-11-18 (×4): qty 3

## 2011-11-18 MED ORDER — SODIUM CHLORIDE 0.9 % IJ SOLN
3.0000 mL | Freq: Two times a day (BID) | INTRAMUSCULAR | Status: DC
  Administered 2011-11-18 – 2011-11-21 (×5): 3 mL via INTRAVENOUS

## 2011-11-18 MED ORDER — METOPROLOL SUCCINATE ER 50 MG PO TB24
50.0000 mg | ORAL_TABLET | Freq: Every day | ORAL | Status: DC
  Administered 2011-11-18 – 2011-11-21 (×4): 50 mg via ORAL
  Filled 2011-11-18 (×4): qty 1

## 2011-11-18 MED ORDER — BIOTENE DRY MOUTH MT LIQD
15.0000 mL | Freq: Two times a day (BID) | OROMUCOSAL | Status: DC
  Administered 2011-11-19 – 2011-11-21 (×5): 15 mL via OROMUCOSAL

## 2011-11-18 MED ORDER — AMLODIPINE BESY-BENAZEPRIL HCL 10-20 MG PO CAPS: 1.0000 | ORAL_CAPSULE | Freq: Every day | ORAL | Status: DC

## 2011-11-18 MED ORDER — POTASSIUM CHLORIDE CRYS ER 10 MEQ PO TBCR
10.0000 meq | EXTENDED_RELEASE_TABLET | Freq: Every day | ORAL | Status: DC
  Administered 2011-11-18 – 2011-11-21 (×4): 10 meq via ORAL
  Filled 2011-11-18 (×4): qty 1

## 2011-11-18 MED ORDER — ACETAMINOPHEN 325 MG PO TABS
650.0000 mg | ORAL_TABLET | ORAL | Status: DC | PRN
  Administered 2011-11-18 – 2011-11-19 (×2): 650 mg via ORAL
  Filled 2011-11-18 (×2): qty 2

## 2011-11-18 MED ORDER — METOCLOPRAMIDE HCL 10 MG PO TABS
10.0000 mg | ORAL_TABLET | Freq: Every day | ORAL | Status: DC
  Administered 2011-11-18 – 2011-11-21 (×4): 10 mg via ORAL
  Filled 2011-11-18 (×4): qty 1

## 2011-11-18 MED ORDER — HEPARIN SOD (PORCINE) IN D5W 100 UNIT/ML IV SOLN
1250.0000 [IU]/h | INTRAVENOUS | Status: DC
  Administered 2011-11-18 – 2011-11-19 (×3): 1150 [IU]/h via INTRAVENOUS
  Administered 2011-11-20 – 2011-11-21 (×2): 1250 [IU]/h via INTRAVENOUS
  Filled 2011-11-18 (×5): qty 250

## 2011-11-18 MED ORDER — ESTRADIOL 1 MG PO TABS: 0.5000 mg | ORAL_TABLET | Freq: Every day | ORAL | Status: DC

## 2011-11-18 MED ORDER — FUROSEMIDE 20 MG PO TABS
20.0000 mg | ORAL_TABLET | Freq: Two times a day (BID) | ORAL | Status: DC
  Administered 2011-11-18: 20 mg via ORAL
  Filled 2011-11-18 (×4): qty 1

## 2011-11-18 MED ORDER — HYDROCODONE-ACETAMINOPHEN 5-325 MG PO TABS
1.0000 | ORAL_TABLET | Freq: Four times a day (QID) | ORAL | Status: DC | PRN
  Administered 2011-11-20 – 2011-11-21 (×2): 1 via ORAL
  Filled 2011-11-18 (×2): qty 1

## 2011-11-18 MED ORDER — AMLODIPINE BESYLATE 10 MG PO TABS
10.0000 mg | ORAL_TABLET | Freq: Every day | ORAL | Status: DC
  Administered 2011-11-18 – 2011-11-21 (×4): 10 mg via ORAL
  Filled 2011-11-18 (×4): qty 1

## 2011-11-18 MED ORDER — SODIUM CHLORIDE 0.9 % IV SOLN: 250.0000 mL | INTRAVENOUS | Status: DC | PRN

## 2011-11-18 MED ORDER — NITROGLYCERIN 0.4 MG SL SUBL: 0.4000 mg | SUBLINGUAL_TABLET | SUBLINGUAL | Status: DC | PRN

## 2011-11-18 MED ORDER — LISINOPRIL 40 MG PO TABS
40.0000 mg | ORAL_TABLET | Freq: Every day | ORAL | Status: DC
  Administered 2011-11-18 – 2011-11-21 (×4): 40 mg via ORAL
  Filled 2011-11-18 (×4): qty 1

## 2011-11-18 MED ORDER — ASPIRIN EC 81 MG PO TBEC
81.0000 mg | DELAYED_RELEASE_TABLET | Freq: Every day | ORAL | Status: DC
  Administered 2011-11-19 – 2011-11-21 (×3): 81 mg via ORAL
  Filled 2011-11-18 (×3): qty 1

## 2011-11-18 MED ORDER — PANTOPRAZOLE SODIUM 40 MG PO PACK
40.0000 mg | PACK | Freq: Every day | ORAL | Status: DC
  Administered 2011-11-18 – 2011-11-21 (×4): 40 mg via ORAL
  Filled 2011-11-18 (×4): qty 20

## 2011-11-18 MED ORDER — INSULIN ASPART 100 UNIT/ML ~~LOC~~ SOLN
0.0000 [IU] | Freq: Three times a day (TID) | SUBCUTANEOUS | Status: DC
  Administered 2011-11-19: 2 [IU] via SUBCUTANEOUS
  Administered 2011-11-19: 0 [IU] via SUBCUTANEOUS
  Administered 2011-11-19 – 2011-11-20 (×2): 2 [IU] via SUBCUTANEOUS
  Administered 2011-11-20: 3 [IU] via SUBCUTANEOUS
  Administered 2011-11-21: 2 [IU] via SUBCUTANEOUS
  Filled 2011-11-18: qty 3

## 2011-11-18 MED ORDER — SIMVASTATIN 20 MG PO TABS
20.0000 mg | ORAL_TABLET | Freq: Every day | ORAL | Status: DC
  Administered 2011-11-18 – 2011-11-20 (×3): 20 mg via ORAL
  Filled 2011-11-18 (×4): qty 1

## 2011-11-18 MED ORDER — OLOPATADINE HCL 0.1 % OP SOLN
1.0000 [drp] | Freq: Two times a day (BID) | OPHTHALMIC | Status: DC
  Administered 2011-11-18 – 2011-11-21 (×7): 1 [drp] via OPHTHALMIC
  Filled 2011-11-18: qty 5

## 2011-11-18 MED ORDER — HEPARIN BOLUS VIA INFUSION
4000.0000 [IU] | Freq: Once | INTRAVENOUS | Status: AC
  Administered 2011-11-18: 4000 [IU] via INTRAVENOUS
  Filled 2011-11-18: qty 4000

## 2011-11-18 NOTE — Assessment & Plan Note (Signed)
Continue aspirin, beta blocker.  Add heparin and nitro paste as noted.  If she rules out for MI, proceed to stress testing.  If she rules in, proceed with cardiac catheterization.

## 2011-11-18 NOTE — Progress Notes (Signed)
11/18/11 1730 Nursing Note:Pt has an IV 20 gauge to Left upper arm. Heparin drip started at 11.45ml/hr. Pt is stable. Will continue to monitor pt. Ylianna Almanzar Scientist, clinical (histocompatibility and immunogenetics) .

## 2011-11-18 NOTE — Assessment & Plan Note (Signed)
Continue PPI ?

## 2011-11-18 NOTE — Progress Notes (Signed)
11/18/11 1640 Nursing Note: Pt was to be started on Heparin drip. Unsuccessful with peripheral IV stick therefore. Pt needs a PICC line placed before heparin drip can be started. MD made aware and put order in for PICC line. Will continue to monitor pt. Ashan Cueva Scientist, clinical (histocompatibility and immunogenetics).

## 2011-11-18 NOTE — Plan of Care (Signed)
Problem: Phase II Progression Outcomes Goal: Stress Test if indicated Outcome: Progressing Scheduled 11/19/11

## 2011-11-18 NOTE — Progress Notes (Signed)
11/18/11 1540 Nursing Note: Pt has an order for a PICC line per MD'S order. Will continue to monitor pt. Denilson Salminen Scientist, clinical (histocompatibility and immunogenetics) .

## 2011-11-18 NOTE — Progress Notes (Signed)
ANTICOAGULATION CONSULT NOTE - Initial Consult  Pharmacy Consult for Heparin Indication: chest pain/ACS  Allergies  Allergen Reactions  . Codeine   . Oxycodone Hcl     Patient Measurements: Height: 5\' 2"  (157.5 cm) Weight: 250 lb 10.6 oz (113.7 kg) IBW/kg (Calculated) : 50.1  Adjusted Body Weight: 78 kg (heparin dosing weight)  Vital Signs: Temp: 96.7 F (35.9 C) (12/14 0951) Temp src: Oral (12/14 0951) BP: 166/77 mmHg (12/14 0951) Pulse Rate: 64  (12/14 0951)  Labs: No results found for this basename: HGB:2,HCT:3,PLT:3,APTT:3,LABPROT:3,INR:3,HEPARINUNFRC:3,CREATININE:3,CKTOTAL:3,CKMB:3,TROPONINI:3 in the last 72 hours Estimated Creatinine Clearance: 66.6 ml/min (by C-G formula based on Cr of 0.91).  Medical History: Past Medical History  Diagnosis Date  . Diabetes mellitus   . HTN (hypertension)   . Seizure disorder   . CAD (coronary artery disease)     multivessel, CABG 6/10.  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  Myoview (2/11) showed EF 62%, fixed septal defect c/w  breast attenuation, minimal inferolateral reversible defect (attenuation versus mild ischemia).  Overall low risk  study  . GERD (gastroesophageal reflux disease)   . Hyperlipidemia   . DVT (deep venous thrombosis)     R arm venous occlusion s/p prior PPM  . Obesity   . DDD (degenerative disc disease)   . Complete heart block 1993    s/p PPM, most recent generator change 2003  . Dehiscence of closure of sternum or sternotomy 8/10    required flap  . COPD (chronic obstructive pulmonary disease)     severe lung disease by PFTs 6/12    Medications:  Prescriptions prior to admission  Medication Sig Dispense Refill  . amLODipine-benazepril (LOTREL) 10-20 MG per capsule Take 1 capsule by mouth daily.        Marland Kitchen aspirin 325 MG tablet Take 325 mg by mouth daily.        Marland Kitchen esomeprazole (NEXIUM) 40 MG capsule Take 40 mg by mouth daily before breakfast.       . estradiol (ESTRACE) 0.5 MG tablet Take 0.5 mg by mouth daily.         . furosemide (LASIX) 20 MG tablet 1 tab po bid      . gabapentin (NEURONTIN) 100 MG tablet Take 100 mg by mouth 3 (three) times daily.        Marland Kitchen HYDROcodone-acetaminophen (VICODIN) 5-500 MG per tablet Take 1 tablet by mouth every 6 (six) hours as needed.        Marland Kitchen lisinopril (PRINIVIL,ZESTRIL) 40 MG tablet Take 40 mg by mouth daily.        . meloxicam (MOBIC) 15 MG tablet Take 15 mg by mouth daily.        . metFORMIN (GLUCOPHAGE) 500 MG tablet Take 500 mg by mouth daily.        . metoCLOPramide (REGLAN) 10 MG tablet Take 10 mg by mouth daily.        . metoprolol succinate (TOPROL-XL) 50 MG 24 hr tablet Take 1 tablet (50 mg total) by mouth daily.  30 tablet  6  . NON FORMULARY Oxygen at night. 2 litters       . olopatadine (PATANOL) 0.1 % ophthalmic solution 1 drop 2 (two) times daily.        . Omega-3 Fatty Acids (FISH OIL) 1000 MG CAPS Take 2 capsules by mouth daily.        . phenytoin (DILANTIN) 100 MG ER capsule 300 mg daily. 3 tabs po qd      . potassium chloride (KLOR-CON)  10 MEQ CR tablet Take 10 mEq by mouth daily.        . pravastatin (PRAVACHOL) 40 MG tablet Take 40 mg by mouth daily.          Assessment: 70 yo F admitted with felling of chest swelling and concern for ACS to start heparin.  Patient denies recent bleeding. Pharmacy Problem List: Anticoagulation: r/o ACS, h/o R arm DVT not on chronic anticoagulation Cardiac: CAD, HTN, pacemaker, hyperlipidemia, diastolic HF EF 62% Neuro: Siezure Disorder Pulmonary: COPD GI: GERD Goal of Therapy:  Heparin level 0.3-0.7 units/ml   Plan:  1. Baseline Labs pending, patient still needs IV line  2. Heparin 4000 units IV x 1 then 3. Heparin 1150 units/hr 4. Check heparin level 8h s/p ggt starts, daily heparin level and CBC  Serah Nicoletti Merrily Brittle 11/18/2011,10:43 AM

## 2011-11-18 NOTE — Assessment & Plan Note (Signed)
Continue pravastatin 

## 2011-11-18 NOTE — Assessment & Plan Note (Signed)
She has chronic chest pain.  However, she does have coronary artery disease.  Her symptoms have changed recently.  She is having symptoms in the office today.  I have discussed her case with Dr. Rollene Rotunda (DOD).  We have recommended admission to the hospital. 1. Continue Toprol.  Continue aspirin.  Start IV heparin per pharmacy.  Apply nitro paste. 2. Check serial cardiac enzymes. 3. She had recent cardiac catheterization with patent grafts.  If she rules out, proceed with inpatient stress testing.  I will go ahead and order a stress test for tomorrow morning. 4. Check a d-dimer.  She is fairly sedentary and is possibly at risk for pulmonary embolism. 5. Consider GI etiology if the above workup is unremarkable. 6. Consider COPD as a source if the above workup is unremarkable. 7. Consider musculoskeletal chest pain if the above workup is unremarkable.

## 2011-11-18 NOTE — H&P (Signed)
Admission History and Pphysical      [address]  [address]  [place], [place] [address]  Phone: [phone]  Fax: [phone]   Date: 11/18/2011   Name: FLORENA KOZMA  DOB: [birth year]  MRN: [id number]   PCP: Dr. Juanetta Gosling  Primary Cardiologist: Dr. Hillis Range  Primary Electrophysiologist: Dr. Hillis Range   History of Present Illness:  [name] is a 70 y.o. female who presents for chest pain. 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 Myoview 2/11: EF 62%, fixed septal defect consistent with breast attenuation, minimal inferolateral reversible defect (attenuation versus mild ischemia). Last echocardiogram 2/11: Mild-moderate LVH, EF 65-70%, MAC. Last LHC 1/12: EF 60%, mLAD 80%, D1 and D2 small with severe disease, mCFX and RCA occluded, S-PDA ok, S-OM ok, L-LAD ok (grafts patent and med tx continued). She underwent pacemaker generator change earlier this year. She has chronic shortness of breath. She also has chronic angina.   She noted substernal chest tightness for the last month. This is worse than her pain in the past. She notes associated shortness of breath. She is sometimes nauseated. She does note pain in her jaw. She is fairly sedentary. She really does not do much at all. She has a disabled husband. She has a CNA that comes in to help with him. She does not go out shopping. She does not do any cooking or cleaning. She, however, does note chest tightness coming on with any type of exertion. She sleeps on 3 pillows chronically. She occasionally awakens short of breath. This is also chronic. She notes some increasing ankle edema. Her weights are stable. She was apparently seen by her PCP recently and asked to followup today. She was having chest tightness in the lobby when she checked in. She is currently  sitting in a wheelchair.   Past Medical History   Diagnosis  Date   .  Diabetes mellitus    .  HTN (hypertension)    .  Seizure disorder    .  CAD (coronary artery disease)      multivessel, CABG 6/10. 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 Myoview (2/11) showed EF 62%, fixed septal defect c/w breast attenuation, minimal inferolateral reversible defect (attenuation versus mild ischemia). Overall low risk study   .  GERD (gastroesophageal reflux disease)    .  Hyperlipidemia    .  DVT (deep venous thrombosis)      R arm venous occlusion s/p prior PPM   .  Obesity    .  DDD (degenerative disc disease)    .  Complete heart block  1993     s/p PPM, most recent generator change 2003   .  Dehiscence of closure of sternum or sternotomy  8/10     required flap   .  COPD (chronic obstructive pulmonary disease)      severe lung disease by PFTs 6/12     Current Outpatient Prescriptions   Medication  Sig  Dispense  Refill   .  alprazolam (XANAX) 2 MG tablet  Take 2 mg by mouth at bedtime as needed.     Marland Kitchen  amLODipine-benazepril (LOTREL) 10-20 MG per capsule  Take 1 capsule by mouth daily.     Marland Kitchen  aspirin 325 MG tablet  Take 325 mg by mouth daily.     Marland Kitchen  esomeprazole (NEXIUM)  40 MG capsule  Take 40 mg by mouth daily before breakfast.     .  estrogens conjugated, synthetic A, (CENESTIN) 0.625 MG tablet  Take 0.625 mg by mouth daily.     .  furosemide (LASIX) 20 MG tablet  1 tab po bid     .  gabapentin (NEURONTIN) 100 MG tablet  Take 100 mg by mouth 3 (three) times daily.     Marland Kitchen  HYDROcodone-acetaminophen (VICODIN) 5-500 MG per tablet  Take 1 tablet by mouth every 6 (six) hours as needed.     .  metFORMIN (GLUCOPHAGE) 500 MG tablet  Take 500 mg by mouth daily.     .  metoprolol succinate (TOPROL-XL) 50 MG 24 hr tablet  Take 1 tablet (50 mg total) by mouth daily.  30 tablet  6   .  nabumetone (RELAFEN) 500 MG tablet  Take 500 mg by mouth daily.     .  NON FORMULARY  Oxygen at night. 2 litters     .  olopatadine  (PATANOL) 0.1 % ophthalmic solution  1 drop 2 (two) times daily.     .  Omega-3 Fatty Acids (FISH OIL) 1000 MG CAPS  Take 2 capsules by mouth daily.     .  phenytoin (DILANTIN) 100 MG ER capsule  300 mg daily. 3 tabs po qd     .  potassium chloride (KLOR-CON) 10 MEQ CR tablet  Take 10 mEq by mouth daily.     .  simvastatin (ZOCOR) 40 MG tablet  Take 40 mg by mouth at bedtime.       Allergies:  Allergies   Allergen  Reactions   .  Codeine    .  Oxycodone Hcl      History   Substance Use Topics   .  Smoking status:  Former Games developer   .  Smokeless tobacco:  Not on file   .  Alcohol Use:  No     Family History   Problem  Relation  Age of Onset   .  Diabetes     .  Heart disease  Mother       MI     ROS: Please see the history of present illness. She has some dysphagia. She denies fevers, chills, sore throat, cough, melena, hematochezia, hematuria, dysuria. All other systems reviewed and negative.   PHYSICAL EXAM:  VS: BP 122/70  Pulse 73  Ht 5\' 2"  (1.575 m)  Wt 251 lb (113.853 kg)  BMI 45.91 kg/m2  SpO2 97%  Well nourished, well developed, in no acute distress  HEENT: normal  Vascular: No carotid bruits  Endocrine: No thyromegaly  Neck: no JVD At 90 degrees  Cardiac: normal S1, S2; RRR; 1/6 systolic murmur Along left sternal border  Lungs: Decreased breath sounds bilaterally, no wheezing, rhonchi or rales  Abd: soft, nontender, no hepatomegaly  Ext: Trace to 1+ bilateral ankle edema  Skin: warm and dry  Neuro: CNs 2-12 intact, no focal abnormalities noted  Psych: Normal affect   EKG: Atrial paced, heart rate 68, left axis deviation, nonspecific ST-T wave changes, No significant change when compared to prior tracings.   ASSESSMENT AND PLAN:   CHEST PAIN - Tereso Newcomer, [place] 11/18/2011 8:57 AM Signed  She has chronic chest pain. However, she does have coronary artery disease. Her symptoms have changed recently. She is having symptoms in the office today. I have discussed  her case with Dr. Rollene Rotunda (DOD). We have recommended admission  to the hospital.  1. Continue Toprol. Continue aspirin. Start IV heparin per pharmacy. Apply nitro paste. 2. Check serial cardiac enzymes. 3. She had recent cardiac catheterization with patent grafts. If she rules out, proceed with inpatient stress testing. I will go ahead and order a stress test for tomorrow morning. 4. Check a d-dimer. She is fairly sedentary and is possibly at risk for pulmonary embolism. 5. Consider GI etiology if the above workup is unremarkable. 6. Consider COPD as a source if the above workup is unremarkable. 7. Consider musculoskeletal chest pain if the above workup is unremarkable.  CAD (coronary artery disease) - Tereso Newcomer, PA 11/18/2011 8:58 AM Signed  Continue aspirin, beta blocker. Add heparin and nitro paste as noted. If she rules out for MI, proceed to stress testing. If she rules in, proceed with cardiac catheterization.  HYPERLIPIDEMIA - Tereso Newcomer, [place] 11/18/2011 8:58 AM Signed  Continue pravastatin.  HYPERTENSION, BENIGN - Tereso Newcomer, [place] 11/18/2011 8:59 AM Signed  Controlled.  Chronic diastolic heart failure - Tereso Newcomer, [place] 11/18/2011 8:59 AM Signed  She does not appear volume overloaded. I will check a BNP. Also check a chest x-ray.  GASTRIC ULCER - Tereso Newcomer, PA 11/18/2011 9:00 AM Signed  Continue PPI.  Tereso Newcomer, PA-C  9:08 AM 11/18/2011    History reviewed with the patient, no changes to be made.  She is having increasing chest pain and SOB.  She did have a cath as described above earlier this year.  The patient exam reveals decreased breath sounds.  Mild lower extremity edema.  All available labs, radiology testing, previous records reviewed. Agree with documented assessment and plan.  Admit and rule out MI.  If enzymes negative then I will plan Lexiscan Myoview.   Rollene Rotunda  9:39 AM 10/21/2011

## 2011-11-18 NOTE — Progress Notes (Signed)
Received order for [facility] needs, too early in pt hospital course to determine full needs, CM will continue to follow for progression and identification of specific needs.  Johny Shock RN MPH Case Manager [phone]

## 2011-11-18 NOTE — Progress Notes (Signed)
[address] [place], [place]  [address] Phone: [phone] Fax:  [phone]  Date:  11/18/2011   Name:  [name]       DOB:  05-13-1941 MRN:  [id number]  PCP:  Dr. Juanetta Gosling Primary Cardiologist:  Dr. Hillis Range  Primary Electrophysiologist:  Dr. Hillis Range    History of Present Illness: [name] is a 70 y.o. female who presents for chest pain.  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  Myoview 2/11: EF 62%, fixed septal defect consistent with breast attenuation, minimal inferolateral reversible defect (attenuation versus mild ischemia).  Last echocardiogram 2/11: Mild-moderate LVH, EF 65-70%, MAC.  Last LHC 1/12:  EF 60%, mLAD 80%, D1 and D2 small with severe disease, mCFX and RCA occluded, S-PDA ok, S-OM ok, L-LAD ok (grafts patent and med tx continued).  She underwent pacemaker generator change earlier this year.  She has chronic shortness of breath.  She also has chronic angina.  She noted substernal chest tightness for the last month.  This is worse than her pain in the past.  She notes associated shortness of breath.  She is sometimes nauseated.  She does note pain in her jaw.  She is fairly sedentary.  She really does not do much at all.  She has a disabled husband.  She has a CNA that comes in to help with him.  She does not go out shopping.  She does not do any cooking or cleaning.  She, however, does note chest tightness coming on with any type of exertion.  She sleeps on 3 pillows chronically.  She occasionally awakens short of breath.  This is also chronic.  She notes some increasing ankle edema.  Her weights are stable.  She was apparently seen by her PCP recently and asked to followup today.  She was having chest tightness in the lobby when she checked in.  She is currently  sitting in a wheelchair.  Past Medical History  Diagnosis Date  . Diabetes mellitus   . HTN (hypertension)   . Seizure disorder   . CAD (coronary artery disease)     multivessel, CABG 6/10.  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  Myoview (2/11) showed EF 62%, fixed septal defect c/w  breast attenuation, minimal inferolateral reversible defect (attenuation versus mild ischemia).  Overall low risk  study  . GERD (gastroesophageal reflux disease)   . Hyperlipidemia   . DVT (deep venous thrombosis)     R arm venous occlusion s/p prior PPM  . Obesity   . DDD (degenerative disc disease)   . Complete heart block 1993    s/p PPM, most recent generator change 2003  . Dehiscence of closure of sternum or sternotomy 8/10    required flap  . COPD (chronic obstructive pulmonary disease)     severe lung disease by PFTs 6/12    Current Outpatient Prescriptions  Medication Sig Dispense Refill  . alprazolam (XANAX) 2 MG tablet Take 2 mg by mouth at bedtime as needed.        Marland Kitchen amLODipine-benazepril (LOTREL) 10-20 MG per capsule Take 1 capsule by mouth daily.        Marland Kitchen aspirin 325 MG tablet Take 325 mg by mouth daily.        Marland Kitchen esomeprazole (NEXIUM) 40 MG capsule Take 40 mg by mouth daily before  breakfast.       . estrogens conjugated, synthetic A, (CENESTIN) 0.625 MG tablet Take 0.625 mg by mouth daily.        . furosemide (LASIX) 20 MG tablet 1 tab po bid      . gabapentin (NEURONTIN) 100 MG tablet Take 100 mg by mouth 3 (three) times daily.        Marland Kitchen HYDROcodone-acetaminophen (VICODIN) 5-500 MG per tablet Take 1 tablet by mouth every 6 (six) hours as needed.        . metFORMIN (GLUCOPHAGE) 500 MG tablet Take 500 mg by mouth daily.        . metoprolol succinate (TOPROL-XL) 50 MG 24 hr tablet Take 1 tablet (50 mg total) by mouth daily.  30 tablet  6  . nabumetone (RELAFEN) 500 MG tablet Take 500 mg by mouth daily.        . NON FORMULARY Oxygen at night. 2 litters       . olopatadine (PATANOL) 0.1 % ophthalmic solution 1  drop 2 (two) times daily.        . Omega-3 Fatty Acids (FISH OIL) 1000 MG CAPS Take 2 capsules by mouth daily.        . phenytoin (DILANTIN) 100 MG ER capsule 300 mg daily. 3 tabs po qd      . potassium chloride (KLOR-CON) 10 MEQ CR tablet Take 10 mEq by mouth daily.        . simvastatin (ZOCOR) 40 MG tablet Take 40 mg by mouth at bedtime.          Allergies: Allergies  Allergen Reactions  . Codeine   . Oxycodone Hcl     History  Substance Use Topics  . Smoking status: Former Games developer  . Smokeless tobacco: Not on file  . Alcohol Use: No    Family History  Problem Relation Age of Onset  . Diabetes    . Heart disease Mother     MI      ROS:  Please see the history of present illness.   She has some dysphagia.  She denies fevers, chills, sore throat, cough, melena, hematochezia, hematuria, dysuria.  All other systems reviewed and negative.   PHYSICAL EXAM: VS:  BP 122/70  Pulse 73  Ht 5\' 2"  (1.575 m)  Wt 251 lb (113.853 kg)  BMI 45.91 kg/m2  SpO2 97% Well nourished, well developed, in no acute distress HEENT: normal Vascular: No carotid bruits Endocrine: No thyromegaly Neck: no JVD At 90 degrees Cardiac:  normal S1, S2; RRR; 1/6 systolic murmur Along left sternal border Lungs:  Decreased breath sounds bilaterally, no wheezing, rhonchi or rales Abd: soft, nontender, no hepatomegaly Ext: Trace to 1+ bilateral ankle edema Skin: warm and dry Neuro:  CNs 2-12 intact, no focal abnormalities noted Psych: Normal affect  EKG:   Atrial paced, heart rate 68, left axis deviation, nonspecific ST-T wave changes, No significant change when compared to prior tracings.  ASSESSMENT AND PLAN:

## 2011-11-18 NOTE — Assessment & Plan Note (Signed)
Controlled.  

## 2011-11-18 NOTE — Progress Notes (Signed)
Pharmacy- Heparin-ACS  Noted heparin level drawn at 8pm as ordered, but IV heparin drip did not start until 1700 today (ordered ~1100).  Plan: Will disregard heparin level of 0.35iu/ml- non steady state level. Will order heparin level for 0100 tonight.  Thanks, Axtyn Woehler K. Allena Katz, PharmD, BCPS.  Clinical Pharmacist Pager [phone]. 11/18/2011 9:05 PM

## 2011-11-18 NOTE — Assessment & Plan Note (Signed)
She does not appear volume overloaded.  I will check a BNP.  Also check a chest x-ray.

## 2011-11-19 ENCOUNTER — Other Ambulatory Visit: Payer: Self-pay

## 2011-11-19 ENCOUNTER — Inpatient Hospital Stay (HOSPITAL_COMMUNITY): Payer: PRIVATE HEALTH INSURANCE

## 2011-11-19 DIAGNOSIS — I509 Heart failure, unspecified: Secondary | ICD-10-CM

## 2011-11-19 DIAGNOSIS — I5033 Acute on chronic diastolic (congestive) heart failure: Secondary | ICD-10-CM

## 2011-11-19 DIAGNOSIS — R079 Chest pain, unspecified: Secondary | ICD-10-CM

## 2011-11-19 DIAGNOSIS — R0602 Shortness of breath: Secondary | ICD-10-CM

## 2011-11-19 LAB — CBC
HCT: 34.6 % — ABNORMAL LOW (ref 36.0–46.0)
Hemoglobin: 10.9 g/dL — ABNORMAL LOW (ref 12.0–15.0)
MCH: 28.6 pg (ref 26.0–34.0)
MCHC: 31.5 g/dL (ref 30.0–36.0)
RDW: 14.3 % (ref 11.5–15.5)

## 2011-11-19 LAB — GLUCOSE, CAPILLARY
Glucose-Capillary: 119 mg/dL — ABNORMAL HIGH (ref 70–99)
Glucose-Capillary: 118 mg/dL — ABNORMAL HIGH (ref 70–99)

## 2011-11-19 LAB — PRO B NATRIURETIC PEPTIDE: Pro B Natriuretic peptide (BNP): 300 pg/mL — ABNORMAL HIGH (ref 0–125)

## 2011-11-19 LAB — LIPID PANEL
Cholesterol: 188 mg/dL (ref 0–200)
HDL: 63 mg/dL (ref >39)
Total CHOL/HDL Ratio: 3 RATIO

## 2011-11-19 LAB — CARDIAC PANEL(CRET KIN+CKTOT+MB+TROPI)
CK, MB: 2 ng/mL (ref 0.3–4.0)
Total CK: 50 U/L (ref 7–177)

## 2011-11-19 LAB — HEPARIN LEVEL (UNFRACTIONATED): Heparin Unfractionated: 0.3 IU/mL (ref 0.30–0.70)

## 2011-11-19 MED ORDER — ALBUTEROL SULFATE HFA 108 (90 BASE) MCG/ACT IN AERS
2.0000 | INHALATION_SPRAY | Freq: Once | RESPIRATORY_TRACT | Status: DC | PRN
  Filled 2011-11-19: qty 6.7

## 2011-11-19 MED ORDER — FUROSEMIDE 10 MG/ML IJ SOLN
40.0000 mg | Freq: Two times a day (BID) | INTRAMUSCULAR | Status: DC
  Administered 2011-11-19 – 2011-11-21 (×5): 40 mg via INTRAVENOUS
  Filled 2011-11-19 (×7): qty 4

## 2011-11-19 MED ORDER — ALBUTEROL SULFATE (5 MG/ML) 0.5% IN NEBU
2.5000 mg | INHALATION_SOLUTION | RESPIRATORY_TRACT | Status: DC
  Administered 2011-11-19 (×4): 2.5 mg via RESPIRATORY_TRACT
  Filled 2011-11-19 (×4): qty 0.5

## 2011-11-19 MED ORDER — HYDRALAZINE HCL 25 MG PO TABS
25.0000 mg | ORAL_TABLET | Freq: Three times a day (TID) | ORAL | Status: DC
  Administered 2011-11-19 – 2011-11-21 (×6): 25 mg via ORAL
  Filled 2011-11-19 (×10): qty 1

## 2011-11-19 MED ORDER — REGADENOSON 0.4 MG/5ML IV SOLN
0.4000 mg | Freq: Once | INTRAVENOUS | Status: AC
  Administered 2011-11-19: 0.4 mg via INTRAVENOUS

## 2011-11-19 MED ORDER — IPRATROPIUM-ALBUTEROL 0.5-2.5 (3) MG/3ML IN SOLN: 3.0000 mL | RESPIRATORY_TRACT | Status: DC

## 2011-11-19 MED ORDER — IPRATROPIUM BROMIDE 0.02 % IN SOLN
0.5000 mg | RESPIRATORY_TRACT | Status: DC
  Administered 2011-11-19 (×4): 0.5 mg via RESPIRATORY_TRACT
  Filled 2011-11-19 (×3): qty 2.5

## 2011-11-19 NOTE — Progress Notes (Signed)
*  PRELIMINARY RESULTS*   Bilateral lower extremity Dopplers completed.  There is no obvious evidence of deep or superficial vein thrombosis noted in the bilateral lower extremities.   Sherren Kerns Renee 11/19/2011, 6:10 PM

## 2011-11-19 NOTE — Progress Notes (Signed)
Reviewing pt. Chart found d-dimer drawn yesterday morning 1.25 notified MD on call

## 2011-11-19 NOTE — Progress Notes (Addendum)
Patient ID: [name], female   DOB: 1941/07/02, 70 y.o.   MRN: [id number] [facility]  SUBJECTIVE: Patient is short of breath at rest today.  She has chest pain but chest wall is quite tender.      Marland Kitchen amLODipine  10 mg Oral Daily  . antiseptic oral rinse  15 mL Mouth Rinse BID  . aspirin EC  81 mg Oral Daily  . estradiol  0.5 mg Oral Daily  . furosemide  20 mg Oral BID  . gabapentin  100 mg Oral TID  . heparin  4,000 Units Intravenous Once  . insulin aspart  0-15 Units Subcutaneous TID WC  . lisinopril  40 mg Oral Daily  . metoCLOPramide  10 mg Oral Daily  . metoprolol succinate  50 mg Oral Daily  . nitroGLYCERIN  0.5 inch Topical Q6H  . olopatadine  1 drop Both Eyes BID  . pantoprazole sodium  40 mg Oral Daily  . phenytoin  300 mg Oral Daily  . potassium chloride  10 mEq Oral Daily  . simvastatin  20 mg Oral q1800  . sodium chloride  3 mL Intravenous Q12H  . DISCONTD: amLODipine-benazepril  1 capsule Oral Daily  . DISCONTD: estradiol  0.5 mg Oral Daily     Filed Vitals:   11/18/11 2100 11/19/11 0011 11/19/11 0300 11/19/11 0430  BP: 164/97 171/83 184/94 156/84  Pulse: 61 59 64   Temp: 98.1 F (36.7 C)  97.4 F (36.3 C)   TempSrc: Oral  Oral   Resp: 18  18   Height:      Weight:   112.991 kg (249 lb 1.6 oz)   SpO2: 98% 98% 98%     Intake/Output Summary (Last 24 hours) at 11/19/11 0903 Last data filed at 11/19/11 0630  Gross per 24 hour  Intake 955.48 ml  Output   1508 ml  Net -552.52 ml    LABS: Basic Metabolic Panel:  Basename 11/18/11 1110  NA 143  K 4.2  CL 107  CO2 24  GLUCOSE 79  BUN 19  CREATININE 0.76  CALCIUM 9.4  MG --  PHOS --   Liver Function Tests:  Basename 11/18/11 1110  AST 26  ALT 21  ALKPHOS 80  BILITOT 0.1*  PROT 7.3  ALBUMIN 3.4*   No results found for this basename: LIPASE:2,AMYLASE:2 in the last 72 hours CBC:  Basename 11/19/11 0645 11/18/11 1110  WBC 8.6 9.4  NEUTROABS -- 6.3  HGB 10.9* 10.1*  HCT 34.6*  32.3*  MCV 90.8 91.0  PLT 187 189   Cardiac Enzymes:  Basename 11/18/11 1745 11/18/11 1110 11/18/11 1105  CKTOTAL 57 57 57  CKMB 2.2 2.3 2.3  CKMBINDEX -- -- --  TROPONINI <0.30 <0.30 <0.30   BNP: No components found with this basename: POCBNP:3 D-Dimer:  Basename 11/18/11 1110  DDIMER 1.25*   Hemoglobin A1C: No results found for this basename: HGBA1C in the last 72 hours Fasting Lipid Panel:  Basename 11/19/11 0645  CHOL 188  HDL 63  LDLCALC 98  TRIG 135  CHOLHDL 3.0  LDLDIRECT --   Thyroid Function Tests:  Basename 11/18/11 1110  TSH 0.546  T4TOTAL --  T3FREE --  THYROIDAB --   Anemia Panel: No results found for this basename: VITAMINB12,FOLATE,FERRITIN,TIBC,IRON,RETICCTPCT in the last 72 hours  RADIOLOGY: No results found.  PHYSICAL EXAM General: Obese, NAD Neck: Thick neck, JVP difficult, no thyromegaly or thyroid nodule.  Lungs: Mild bibasilar crackles, prolonged expiratory phase.  CV:  Nondisplaced PMI.  Heart regular S1/S2, no S3/S4, no murmur.  Trace ankle edema.  No carotid bruit.   Abdomen: Soft, nontender, no hepatosplenomegaly, no distention.  Neurologic: Alert and oriented x 3.  Psych: Normal affect. Extremities: No clubbing or cyanosis.  MSK: Chest wall is tender.   ASSESSMENT AND PLAN: 70 yo presented with chest pain and dyspnea.  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 Myoview 2/11: EF 62%, fixed septal defect consistent with breast attenuation, minimal inferolateral reversible defect (attenuation versus mild ischemia). Last echocardiogram 2/11: Mild-moderate LVH, EF 65-70%, MAC. Last LHC 1/12: EF 60%, mLAD 80%, D1 and D2 small with severe disease, mCFX and RCA occluded, S-PDA ok, S-OM ok, L-LAD ok (grafts patent and med tx continued). She underwent pacemaker generator change  earlier this year. She has chronic shortness of breath. She also has chronic angina.   1. Chest pain: Patient continues to have lower chest pain, but her chest wall is quite tender.  She has a history of CAD as above.  Her D dimer is elevated but has been chronically elevated.  Cardiac enzymes negative.  - Lexiscan myoview today.  - Think probably low likelihood PE but will get lower extremity ultrasounds for DVT. - Continue ASA, heparin gtt for now.  - LDL goal < 70 with known CAD, will change statin to atorvastatin 40 at discharge.  2.  Diastolic CHF: Suspect acute on chronic diastolic CHF though volume status is difficult.  Will change Lasix to IV (40 mg IV bid).   3.  COPD:  Given dyspnea, will add Atrovent and Albuterol inhalers.   4.  HTN: BP very high, add hydralazine.   [name] 11/19/2011 9:13 AM

## 2011-11-19 NOTE — Progress Notes (Signed)
Lexiscan Nuclear Study Day 1/2  Pre Test: No complaints. VSS except blood pressure is elevated this AM. EKG NSR/SB with TWI I, avL, V2-V6. I see that Dr. Shirlee Latch has added inhalers today, so in preparation for test we have obtained them from her floor to have on hand for the test prn.  During Test: C/o feeling hot with mild nausea. RR increased, but no SOB/CP. VSS except blood pressure still elevated. She had a brief 4 beat run of atrial tach ?SVT vs PACs. No symptoms with this. Otherwise EKG without acute changes.  Post Test: VSS. No CP, SOB but some mild residual nausea. EKG at baseline.  Await interpretation of images.  Nelly Scriven PA-C 11/19/2011

## 2011-11-19 NOTE — Progress Notes (Signed)
ANTICOAGULATION CONSULT NOTE - Follow Up Consult  Pharmacy Consult for heparin Indication: chest pain/ACS  Allergies  Allergen Reactions  . Codeine     "good" headache  . Oxycodone Hcl     "good" headache    Patient Measurements: Height: 5\' 2"  (157.5 cm) Weight: 249 lb 1.6 oz (112.991 kg) (scale c) IBW/kg (Calculated) : 50.1   Vital Signs: Temp: 97.6 F (36.4 C) (12/15 1445) Temp src: Oral (12/15 1445) BP: 152/62 mmHg (12/15 1445) Pulse Rate: 71  (12/15 1445)  Labs:  Basename 11/19/11 0645 11/19/11 0025 11/18/11 2020 11/18/11 1745 11/18/11 1110 11/18/11 1105  HGB 10.9* -- -- -- 10.1* --  HCT 34.6* -- -- -- 32.3* --  PLT 187 -- -- -- 189 --  APTT -- -- -- -- 30 --  LABPROT -- -- -- -- 14.1 --  INR -- -- -- -- 1.07 --  HEPARINUNFRC 0.30 0.42 0.35 -- -- --  CREATININE -- -- -- -- 0.76 --  CKTOTAL -- -- -- 57 57 57  CKMB -- -- -- 2.2 2.3 2.3  TROPONINI -- -- -- <0.30 <0.30 <0.30   Estimated Creatinine Clearance: 75.6 ml/min (by C-G formula based on Cr of 0.76).   Medications:  Infusions:     . heparin 1,150 Units/hr (11/19/11 0741)    Assessment: 70 yof on heparin for CP/ACS. Heparin level is 0.30 which is at goal but has decreased so will increase rate slightly. No bleeding or other problems noted.   Goal of Therapy:  Heparin level 0.3-0.7 units/ml   Plan:  Increase heparin gtt at 1250units/hr F/u AM heparin level  Janace Litten, PharmD 11/19/2011,3:25 PM

## 2011-11-19 NOTE — Progress Notes (Signed)
ANTICOAGULATION CONSULT NOTE - Follow Up Consult  Pharmacy Consult for heparin Indication: chest pain/ACS  Allergies  Allergen Reactions  . Codeine     "good" headache  . Oxycodone Hcl     "good" headache    Patient Measurements: Height: 5\' 2"  (157.5 cm) Weight: 250 lb 10.6 oz (113.7 kg) IBW/kg (Calculated) : 50.1   Vital Signs: Temp: 98.1 F (36.7 C) (12/14 2100) Temp src: Oral (12/14 2100) BP: 171/83 mmHg (12/15 0011) Pulse Rate: 59  (12/15 0011)  Labs:  Basename 11/19/11 0025 11/18/11 2020 11/18/11 1745 11/18/11 1110 11/18/11 1105  HGB -- -- -- 10.1* --  HCT -- -- -- 32.3* --  PLT -- -- -- 189 --  APTT -- -- -- 30 --  LABPROT -- -- -- 14.1 --  INR -- -- -- 1.07 --  HEPARINUNFRC 0.42 0.35 -- -- --  CREATININE -- -- -- 0.76 --  CKTOTAL -- -- 57 57 57  CKMB -- -- 2.2 2.3 2.3  TROPONINI -- -- <0.30 <0.30 <0.30   Estimated Creatinine Clearance: 75.8 ml/min (by C-G formula based on Cr of 0.76).   Medications:  Infusions:    . heparin 1,150 Units/hr (11/18/11 1706)    Assessment: 70 yof on heparin for CP/ACS. Heparin level is 0.42 which is at goal. No bleeding or other problems noted.   Goal of Therapy:  Heparin level 0.3-0.7 units/ml   Plan:  Continue heparin gtt at 1150units/hr F/u AM heparin level  Cashton Hosley, Drake Leach 11/19/2011,1:47 AM

## 2011-11-19 NOTE — Progress Notes (Signed)
MD aware of d-dimer results

## 2011-11-19 NOTE — Progress Notes (Signed)
Pt. Complaining of chest tightness 00:05, similar to the tightness she has been having this admission. Pt. Had EKG done, VS HR 62, O2 98% 2L, BP 171/83. Resp 18. Pt. States the tightness is slowly letting up. MD paged at 00:18. MD aware no orders at this point will continue to monitor.

## 2011-11-20 ENCOUNTER — Inpatient Hospital Stay (HOSPITAL_COMMUNITY): Payer: PRIVATE HEALTH INSURANCE

## 2011-11-20 DIAGNOSIS — R079 Chest pain, unspecified: Secondary | ICD-10-CM

## 2011-11-20 LAB — CBC
HCT: 34.8 % — ABNORMAL LOW (ref 36.0–46.0)
Hemoglobin: 11 g/dL — ABNORMAL LOW (ref 12.0–15.0)
MCV: 90.9 fL (ref 78.0–100.0)
RBC: 3.83 MIL/uL — ABNORMAL LOW (ref 3.87–5.11)
RDW: 14.3 % (ref 11.5–15.5)
WBC: 10.2 10*3/uL (ref 4.0–10.5)

## 2011-11-20 LAB — BASIC METABOLIC PANEL
BUN: 12 mg/dL (ref 6–23)
CO2: 24 mEq/L (ref 19–32)
Chloride: 101 mEq/L (ref 96–112)
Creatinine, Ser: 0.66 mg/dL (ref 0.50–1.10)
GFR calc Af Amer: >90 mL/min (ref >90)
Potassium: 3.7 mEq/L (ref 3.5–5.1)

## 2011-11-20 LAB — HEPARIN LEVEL (UNFRACTIONATED): Heparin Unfractionated: 0.45 IU/mL (ref 0.30–0.70)

## 2011-11-20 LAB — GLUCOSE, CAPILLARY: Glucose-Capillary: 70 mg/dL (ref 70–99)

## 2011-11-20 MED ORDER — TECHNETIUM TC 99M TETROFOSMIN IV KIT
30.0000 | PACK | Freq: Once | INTRAVENOUS | Status: AC | PRN
  Administered 2011-11-20: 30 via INTRAVENOUS

## 2011-11-20 MED ORDER — IPRATROPIUM BROMIDE 0.02 % IN SOLN: 0.5000 mg | RESPIRATORY_TRACT | Status: DC | PRN

## 2011-11-20 MED ORDER — TECHNETIUM TC 99M TETROFOSMIN IV KIT
30.0000 | PACK | Freq: Once | INTRAVENOUS | Status: AC | PRN
  Administered 2011-11-19: 30 via INTRAVENOUS

## 2011-11-20 MED ORDER — IPRATROPIUM BROMIDE 0.02 % IN SOLN
0.5000 mg | Freq: Four times a day (QID) | RESPIRATORY_TRACT | Status: DC
  Administered 2011-11-20 – 2011-11-21 (×5): 0.5 mg via RESPIRATORY_TRACT
  Filled 2011-11-20 (×5): qty 2.5

## 2011-11-20 MED ORDER — ALBUTEROL SULFATE (5 MG/ML) 0.5% IN NEBU
2.5000 mg | INHALATION_SOLUTION | RESPIRATORY_TRACT | Status: DC | PRN
Start: 2011-11-20 — End: 2011-11-21

## 2011-11-20 MED ORDER — POTASSIUM CHLORIDE 20 MEQ/15ML (10%) PO LIQD
40.0000 meq | Freq: Once | ORAL | Status: AC
  Administered 2011-11-20: 40 meq via ORAL
  Filled 2011-11-20: qty 30

## 2011-11-20 NOTE — Progress Notes (Signed)
Patient ID: [name], female   DOB: 11/30/1941, 70 y.o.   MRN: [id number]  [facility]  SUBJECTIVE: Patient feels better and looks better today.  She says she urinated a lot yesterday but not sure that all was recorded.  Her chest wall continues to be tender.  She completed 1st day of 2 day stress test yesterday.  Lower extremity ultrasound was negative for DVT.      Marland Kitchen amLODipine  10 mg Oral Daily  . antiseptic oral rinse  15 mL Mouth Rinse BID  . aspirin EC  81 mg Oral Daily  . estradiol  0.5 mg Oral Daily  . furosemide  40 mg Intravenous BID  . gabapentin  100 mg Oral TID  . hydrALAZINE  25 mg Oral Q8H  . insulin aspart  0-15 Units Subcutaneous TID WC  . ipratropium  0.5 mg Nebulization Q6H  . lisinopril  40 mg Oral Daily  . metoCLOPramide  10 mg Oral Daily  . metoprolol succinate  50 mg Oral Daily  . nitroGLYCERIN  0.5 inch Topical Q6H  . olopatadine  1 drop Both Eyes BID  . pantoprazole sodium  40 mg Oral Daily  . phenytoin  300 mg Oral Daily  . potassium chloride  10 mEq Oral Daily  . potassium chloride  40 mEq Oral Once  . regadenoson  0.4 mg Intravenous Once  . simvastatin  20 mg Oral q1800  . sodium chloride  3 mL Intravenous Q12H  . DISCONTD: albuterol  2.5 mg Nebulization Q4H  . DISCONTD: furosemide  20 mg Oral BID  . DISCONTD: ipratropium  0.5 mg Nebulization Q4H  . DISCONTD: ipratropium-albuterol  3 mL Nebulization Q4H     Filed Vitals:   11/19/11 2042 11/19/11 2131 11/19/11 2357 11/20/11 0322  BP:  133/65  123/75  Pulse:  72  70  Temp:  97.4 F (36.3 C)  98.2 F (36.8 C)  TempSrc:      Resp:  20  18  Height:      Weight:    112.9 kg (248 lb 14.4 oz)  SpO2: 95% 95% 95% 95%    Intake/Output Summary (Last 24 hours) at 11/20/11 0825 Last data filed at 11/20/11 0700  Gross per 24 hour  Intake    750 ml  Output    952 ml  Net   -202 ml    LABS: Basic Metabolic Panel:  Basename 11/20/11 0605 11/18/11 1110  NA 138 143  K 3.7 4.2  CL 101  107  CO2 24 24  GLUCOSE 124* 79  BUN 12 19  CREATININE 0.66 0.76  CALCIUM 9.5 9.4  MG -- --  PHOS -- --   Liver Function Tests:  Basename 11/18/11 1110  AST 26  ALT 21  ALKPHOS 80  BILITOT 0.1*  PROT 7.3  ALBUMIN 3.4*   No results found for this basename: LIPASE:2,AMYLASE:2 in the last 72 hours CBC:  Basename 11/20/11 0605 11/19/11 0645 11/18/11 1110  WBC 10.2 8.6 --  NEUTROABS -- -- 6.3  HGB 11.0* 10.9* --  HCT 34.8* 34.6* --  MCV 90.9 90.8 --  PLT 204 187 --   Cardiac Enzymes:  Basename 11/19/11 1600 11/18/11 1745 11/18/11 1110  CKTOTAL 50 57 57  CKMB 2.0 2.2 2.3  CKMBINDEX -- -- --  TROPONINI <0.30 <0.30 <0.30   BNP: No components found with this basename: POCBNP:3 D-Dimer:  Basename 11/18/11 1110  DDIMER 1.25*   Hemoglobin A1C: No results found for this basename:  HGBA1C in the last 72 hours Fasting Lipid Panel:  Basename 11/19/11 0645  CHOL 188  HDL 63  LDLCALC 98  TRIG 135  CHOLHDL 3.0  LDLDIRECT --   Thyroid Function Tests:  Basename 11/18/11 1110  TSH 0.546  T4TOTAL --  T3FREE --  THYROIDAB --   Anemia Panel: No results found for this basename: VITAMINB12,FOLATE,FERRITIN,TIBC,IRON,RETICCTPCT in the last 72 hours  RADIOLOGY: No results found.  PHYSICAL EXAM General: Obese, NAD Neck: Thick neck, JVP difficult, no thyromegaly or thyroid nodule.  Lungs: Clear bilaterally.  CV: Nondisplaced PMI.  Heart regular S1/S2, no S3/S4, no murmur.  Trace ankle edema.  No carotid bruit.   Abdomen: Soft, nontender, no hepatosplenomegaly, no distention.  Neurologic: Alert and oriented x 3.  Psych: Normal affect. Extremities: No clubbing or cyanosis.  MSK: Chest wall is tender.   ASSESSMENT AND PLAN: 70 yo presented with chest pain and dyspnea.  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 Myoview 2/11: EF 62%, fixed septal defect consistent with breast attenuation, minimal inferolateral reversible defect (attenuation versus mild ischemia). Last echocardiogram 2/11: Mild-moderate LVH, EF 65-70%, MAC. Last LHC 1/12: EF 60%, mLAD 80%, D1 and D2 small with severe disease, mCFX and RCA occluded, S-PDA ok, S-OM ok, L-LAD ok (grafts patent and med tx continued). She underwent pacemaker generator change earlier this year. She has chronic shortness of breath. She also has chronic angina.   1. Chest pain: Chest pain much better but chest wall still tender to palpation.  She has a history of CAD as above.  Her D dimer was elevated but has been chronically elevated.  Cardiac enzymes negative.  Steffanie Dunn 2nd day today.  - Chronic D dimer elevation.  Thought PE low likelihood so did lower extremity ultrasound that was negative for DVT. - Continue ASA, heparin gtt for now pending result of stress test.  - LDL goal < 70 with known CAD, will change statin to atorvastatin 40 at discharge.  2.  Diastolic CHF: Suspect acute on chronic diastolic CHF though volume status is difficult.  Breathing better with IV Lasix diuresis though think I/Os probably inaccurate.  Continue IV Lasix today.  3.  COPD:  Continue Atrovent and Albuterol inhalers.   4.  HTN: BP better today. 5.  If myoview negative, would give another day IV Lasix and probably home tomorrow.   Marca Ancona 11/20/2011 8:25 AM

## 2011-11-20 NOTE — Progress Notes (Signed)
ANTICOAGULATION CONSULT NOTE - Follow Up Consult  Pharmacy Consult for heparin Indication: chest pain/ACS  Allergies  Allergen Reactions  . Codeine     "good" headache  . Oxycodone Hcl     "good" headache    Patient Measurements: Height: 5\' 2"  (157.5 cm) Weight: 248 lb 14.4 oz (112.9 kg) (c scale) IBW/kg (Calculated) : 50.1   Vital Signs: Temp: 98.7 F (37.1 C) (12/16 1348) Temp src: Oral (12/16 1348) BP: 94/57 mmHg (12/16 1348) Pulse Rate: 62  (12/16 1348)  Labs:  Basename 11/20/11 0605 11/19/11 1600 11/19/11 0645 11/19/11 0025 11/18/11 1745 11/18/11 1110  HGB 11.0* -- 10.9* -- -- --  HCT 34.8* -- 34.6* -- -- 32.3*  PLT 204 -- 187 -- -- 189  APTT -- -- -- -- -- 30  LABPROT -- -- -- -- -- 14.1  INR -- -- -- -- -- 1.07  HEPARINUNFRC 0.45 -- 0.30 0.42 -- --  CREATININE 0.66 -- -- -- -- 0.76  CKTOTAL -- 50 -- -- 57 57  CKMB -- 2.0 -- -- 2.2 2.3  TROPONINI -- <0.30 -- -- <0.30 <0.30   Estimated Creatinine Clearance: 75.5 ml/min (by C-G formula based on Cr of 0.66).     Assessment: 70 yof on heparin for CP/ACS. Heparin level is 0.45 at goal 0.3-0.7.  No bleeding noted, cbc stable.  Plan for day 2 of stress test plan per results.  Cardiac enzymes neg, diuresed 1L since admit.    Goal of Therapy:  Heparin level 0.3-0.7 units/ml   Plan:  Continue heparin gtt at 1250units/hr F/u AM heparin level  Marcelino Scot, PharmD 11/20/2011,2:32 PM

## 2011-11-21 ENCOUNTER — Encounter (HOSPITAL_COMMUNITY): Payer: Self-pay | Admitting: Physician Assistant

## 2011-11-21 DIAGNOSIS — R0602 Shortness of breath: Secondary | ICD-10-CM

## 2011-11-21 LAB — CBC
HCT: 32.3 % — ABNORMAL LOW (ref 36.0–46.0)
Hemoglobin: 10.4 g/dL — ABNORMAL LOW (ref 12.0–15.0)
MCH: 28.9 pg (ref 26.0–34.0)
RBC: 3.6 MIL/uL — ABNORMAL LOW (ref 3.87–5.11)

## 2011-11-21 LAB — GLUCOSE, CAPILLARY
Glucose-Capillary: 136 mg/dL — ABNORMAL HIGH (ref 70–99)
Glucose-Capillary: 99 mg/dL (ref 70–99)

## 2011-11-21 LAB — BASIC METABOLIC PANEL
BUN: 12 mg/dL (ref 6–23)
CO2: 26 mEq/L (ref 19–32)
Calcium: 9.1 mg/dL (ref 8.4–10.5)
Glucose, Bld: 122 mg/dL — ABNORMAL HIGH (ref 70–99)
Potassium: 3.5 mEq/L (ref 3.5–5.1)
Sodium: 138 mEq/L (ref 135–145)

## 2011-11-21 MED ORDER — ATORVASTATIN CALCIUM 40 MG PO TABS: 40.0000 mg | ORAL_TABLET | Freq: Every day | ORAL | Status: DC

## 2011-11-21 MED ORDER — FUROSEMIDE 40 MG PO TABS: 40.0000 mg | ORAL_TABLET | Freq: Two times a day (BID) | ORAL | Status: DC

## 2011-11-21 MED ORDER — AMLODIPINE BESYLATE 10 MG PO TABS: 10.0000 mg | ORAL_TABLET | Freq: Every day | ORAL | Status: DC

## 2011-11-21 MED ORDER — POTASSIUM CHLORIDE 10 MEQ PO TBCR: 10.0000 meq | EXTENDED_RELEASE_TABLET | Freq: Two times a day (BID) | ORAL | Status: DC

## 2011-11-21 MED ORDER — NITROGLYCERIN 0.4 MG SL SUBL: 0.4000 mg | SUBLINGUAL_TABLET | SUBLINGUAL | Status: DC | PRN

## 2011-11-21 MED ORDER — ASPIRIN 81 MG PO TABS: 81.0000 mg | ORAL_TABLET | Freq: Every day | ORAL | Status: DC

## 2011-11-21 MED ORDER — HYDRALAZINE HCL 25 MG PO TABS: 25.0000 mg | ORAL_TABLET | Freq: Three times a day (TID) | ORAL | Status: DC

## 2011-11-21 NOTE — Discharge Summary (Signed)
Discharge Summary   Patient ID: [name] MRN: [id number], DOB/AGE: May 21, 1941 70 y.o. Admit date: 11/18/2011 D/C date:     11/21/2011   Primary Discharge Diagnoses:  1. Chest pain, felt atypical - ruled out for MI - normal Lexiscan myoview this admission  2. Acute on chronic diastolic CHF - last echo 01/2010 with EF 65-70%, 62% by nuclear stress this admission 3. CAD (coronary artery disease)  - multivessel, CABG 6/10 - Inferior MI 6/09 - cath 12/2010 showing mLAD 80%, D1 and D2 small with severe disease, mCFX and RCA occluded, S-PDA ok, S-OM ok, L-LAD ok (grafts patent and med tx continued) - Myoview as above  4. Anemia 5. Obesity 6. HTN  7. Hyperlipidemia - changed to Lipitor this admission for goal LDL <70 (was 98) 8. COPD (chronic obstructive pulmonary disease)  - severe lung disease by PFTs 6/12   Secondary Discharge Diagnoses:  1. Diabetes mellitus   2. Seizure disorder   3. GERD (gastroesophageal reflux disease)   4. DVT (deep venous thrombosis)  - R arm venous occlusion s/p prior PPM   5. DDD (degenerative disc disease)   6. 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 Dehiscence of closure of sternum or sternotomy 8/10, requiring flap    Hospital Course: Ms. Abid is a 70 y/o F with hx CAD s/p CABG, ppm, HTN, HL, diastolic CHF who presented with c/o CP. She has noted sub-sternal chest tightness for the last month with associated SOB, sometimes nausea. She sleeps on 3 pillows chronically, has had some increase in ankle edema, and noted occasionally waking up SOB. She had seen her PCP and was asked to follow up and was seen by Dr. Antoine Poche. She was having some chest tightness when she checked in. Vitals were stable. EKG was atrial paced, heart rate 68, left axis deviation, nonspecific ST-T wave changes, no significant change compared to prior. Because of her hx, she was admitted to the hospital for further evaluation. She was started on IV  heparin and nitropaste. Cardiac enzymes were cycled which were negative. D-dimer was 1.25, although this has been chronically elevated and Dr. Shirlee Latch felt she was had low likelihood for PE - he checked LE dopplers which were negative for DVT. She wasn't felt initially to be volume overloaded but on the following day it was felt hat she did have a component of acute on chronic CHF so IV Lasix was initiated, which did seem to help her breathing status. Nebs were also started given her dyspnea. Hydralazine was added for blood pressure. It was advised she be prescribed Lipitor 40mg  given her CAD. She underwent a 2-day Myoview to evaluate her chest pain which was negative for ischemia, with EF 62%. She was also noted to be mildly anemic this admission and will therefore be instructed to follow-up with PCP. Today she is feeling better. Dr. Johney Frame feels her CP is atypical and requires no further cardiac testing given norma Myoview. He suspects her obesity is contributing to her chronic dyspnea and atypical CP and advised weight loss. See below for final med changes. She was also seen by cardiac rehab and was felt to benefit from [facility] and HHPT - she was able to walk with rolling walker & supervision but without acute dyspnea. Dr. Johney Frame has seen and examined her today and feel she is stable for discharge.   Discharge Vitals: Blood pressure 125/77, pulse 60, temperature 98 F (36.7 C), temperature source Oral,  resp. rate 20, height 5\' 2"  (1.575 m), weight 248 lb 0.3 oz (112.5 kg), SpO2 95.00%.  Labs: Lab Results  Component Value Date   WBC 8.5 11/21/2011   HGB 10.4* 11/21/2011   HCT 32.3* 11/21/2011   MCV 89.7 11/21/2011   PLT 172 11/21/2011    Lab 11/21/11 0630 11/18/11 1110  NA 138 --  K 3.5 --  CL 102 --  CO2 26 --  BUN 12 --  CREATININE 0.74 --  CALCIUM 9.1 --  PROT -- 7.3  BILITOT -- 0.1*  ALKPHOS -- 80  ALT -- 21  AST -- 26  GLUCOSE 122* --    Basename 11/19/11 1600 11/18/11 1745 11/18/11  1110 11/18/11 1105  CKTOTAL 50 57 57 57  CKMB 2.0 2.2 2.3 2.3  TROPONINI <0.30 <0.30 <0.30 <0.30   Lab Results  Component Value Date   CHOL 188 11/19/2011   HDL 63 11/19/2011   LDLCALC 98 11/19/2011   TRIG 135 11/19/2011   Lab Results  Component Value Date   DDIMER 1.25* 11/18/2011    Diagnostic Studies/Procedures   1. Chest 2 View 11/19/2011  *RADIOLOGY REPORT*  Clinical Data: Chest pain  CHEST - 2 VIEW  Comparison: 05/24/2011  Findings: There is a right chest wall pacer device with lead in the right atrial appendage and right ventricle.  The heart size appears moderately enlarged.   There are no pleural effusions or pulmonary edema identified.  There is no airspace consolidation identified.  IMPRESSION:  1.  Cardiac enlargement 2.  No heart failure.  Original Report Authenticated By: Rosealee Albee, M.D.   2. Myocar Multi W/spect W/wall Motion / Ef 11/20/2011  *RADIOLOGY REPORT*  Findings:  SPECT imaging demonstrates homogeneous distribution of radiotracer. The previously described area of potential attenuation versus minimal ischemia involving the posterior lateral wall is not demonstrated on this examination.  No discrete areas of decreased radiotracer uptake to suggest prior infarction or pharmacologically induced ischemia.  Quantitative gated analysis shows normal wall motion.  The resting left ventricular ejection fraction is 62% with end- diastolic volume of 81 ml and end-systolic volume of 31 ml.  IMPRESSION:  1.  No scintigraphic evidence of prior infarction or pharmacologically induced ischemia.  2.  Normal wall motion.  Ejection fraction - 62%, unchanged from prior examination performed 01/2010.     Discharge Medications   Current Discharge Medication List    START taking these medications   Details  amLODipine (NORVASC) 10 MG tablet Take 1 tablet (10 mg total) by mouth daily. Qty: 30 tablet, Refills: 6    atorvastatin (LIPITOR) 40 MG tablet Take 1 tablet (40 mg total)  by mouth at bedtime. Qty: 30 tablet, Refills: 6    hydrALAZINE (APRESOLINE) 25 MG tablet Take 1 tablet (25 mg total) by mouth every 8 (eight) hours. Qty: 90 tablet, Refills: 6    nitroGLYCERIN (NITROSTAT) 0.4 MG SL tablet Place 1 tablet (0.4 mg total) under the tongue every 5 (five) minutes as needed for chest pain (Up to 3 doses). Qty: 25 tablet, Refills: 3   Associated Diagnoses: Chest pain, unspecified      CONTINUE these medications which have CHANGED   Details  aspirin 81 MG tablet Take 1 tablet (81 mg total) by mouth daily.    furosemide (LASIX) 40 MG tablet Take 1 tablet (40 mg total) by mouth 2 (two) times daily. This is a new increased dose. Qty: 60 tablet, Refills: 6    potassium chloride (KLOR-CON) 10 MEQ  CR tablet Take 1 tablet (10 mEq total) by mouth 2 (two) times daily.      CONTINUE these medications which have NOT CHANGED   Details  esomeprazole (NEXIUM) 40 MG capsule Take 40 mg by mouth daily before breakfast.     estradiol (ESTRACE) 0.5 MG tablet Take 0.5 mg by mouth daily.      fish oil-omega-3 fatty acids 1000 MG capsule Take 1 g by mouth 2 (two) times daily.      gabapentin (NEURONTIN) 100 MG tablet Take 100 mg by mouth 3 (three) times daily.      HYDROcodone-acetaminophen (VICODIN) 5-500 MG per tablet Take 1 tablet by mouth every 6 (six) hours as needed. For pain     lisinopril (PRINIVIL,ZESTRIL) 40 MG tablet Take 40 mg by mouth daily.      metFORMIN (GLUCOPHAGE) 500 MG tablet Take 500 mg by mouth daily.      metoCLOPramide (REGLAN) 10 MG tablet Take 10 mg by mouth every morning.      metoprolol succinate (TOPROL-XL) 50 MG 24 hr tablet Take 1 tablet (50 mg total) by mouth daily. Qty: 30 tablet, Refills: 6    NON FORMULARY Oxygen at night. 2 litters     olopatadine (PATANOL) 0.1 % ophthalmic solution Place 1 drop into both eyes 2 (two) times daily.      phenytoin (DILANTIN) 100 MG ER capsule Take 300 mg by mouth at bedtime.        STOP taking  these medications     amLODipine-benazepril (LOTREL) 10-20 MG per capsule      pravastatin (PRAVACHOL) 40 MG tablet         Disposition   The patient will be discharged in stable condition to home. Discharge Orders    Future Appointments: Provider: Department: Dept Phone: Center:   12/08/2011 3:00 PM Beatrice Lecher, PA [facility] [phone] [id number]     Future Orders Please Complete By Expires   Diet - low sodium heart healthy      Increase activity slowly      Discharge instructions      Comments:   See below for instructions on weighing yourself daily and notifying our office of increase in symptoms.     Follow-up Information    Follow up with HAWKINS,EDWARD L. (You are mildly anemic and this may need further workup. Please continue to follow-up with your primary doctor.)    Contact information:   [address] [place] [address] [phone]       Follow up with Tereso Newcomer, PA. (Follow up at [facility] on 12/08/11 at 3pm.)    Contact information:   [address] [place] [address] [phone]            Duration of Discharge Encounter: Greater than 30 minutes including physician and PA time.  Signed, Ronie Spies PA-C 11/21/2011, 9:54 AM   I have seen, examined the patient, and reviewed the above assessment and plan.   Co Sign: Hillis Range, MD 11/21/2011 10:43 AM

## 2011-11-21 NOTE — Progress Notes (Signed)
IV d/c'd.  Tele d/c'd.  Pt d/c'd to home.  Home meds and d/c instructions have been discussed with pt.  Pt denies any questions or concerns at this time.  Pt leaving unit via wheelchair and appears in no acute distress. Clarrissa Shimkus RN 

## 2011-11-21 NOTE — Progress Notes (Signed)
CARDIAC REHAB PHASE I   PRE:  Rate/Rhythm: 66 pacing    BP: sitting 105/65    SaO2: 98 2L, 96 RA  MODE:  Ambulation: 68 ft   POST:  Rate/Rhythm: 67 pacign    BP: sitting 125/64     SaO2: 95 RA  Pt sts she is "sore all over". Arms, hands, center of chest, legs. Walked short distance with RW, supervision assist. Denied breathing issues, SaO2 good on RA. Just c/o fatigue from pain. Return to chair, left O2 off. Reminded to weigh daily. Needs HHRN and HHPT. [phone] Harriet Masson CES, ACSM

## 2011-11-21 NOTE — Progress Notes (Signed)
   Heart Failure CARE MANAGEMENT NOTE 11/21/2011  Patient:  [name],[name]   Account Number:  [id number]  Date Initiated:  11/18/2011  Documentation initiated by:  Johny Shock  Subjective/Objective Assessment:   Order for [facility] needs.     Action/Plan:   Pt undergoing treatment and evaluation will continue to follow for d/c needs, dependent upon progress.   Anticipated DC Date:  11/21/2011   Anticipated DC Plan:  HOME W HOME HEALTH SERVICES      DC Planning Services  CM consult      [facility] Choice  HOME HEALTH   Choice offered to / List presented to:  [name]        HH arranged  HH-1 RN  HH-10 DISEASE MANAGEMENT  HH-2 PT      HH agency  [facility]   Status of service:  Completed, signed off Medicare Important Message given?   (If response is "NO", the following Medicare IM given date fields will be blank) Date Medicare IM given:   Date Additional Medicare IM given:    Discharge Disposition:  HOME W HOME HEALTH SERVICES  Per UR Regulation:  Reviewed for med. necessity/level of care/duration of stay  Comments:  Heart Failure Patient:  Met with patient to set up HHRN/PT per MD order.  First choice was for [facility].  Spoke with representative there, Cordelia Pen and due to [employer] they do not accept her insurance.  Spoke with patient again and she chose [facility].  Notified Marie with referral and referral completed in [facility].  Will continue to assist. UR Review Completed. 11/21/11 1102 Shannan Harper, RN, BSN    11/18/2011 Hopefully home with [facility] however depending upon progression may need short term rehab. Johny Shock RN MPH                   Case Manager.......................[phone]

## 2011-11-21 NOTE — Progress Notes (Signed)
SUBJECTIVE: The patient is doing well today.  Her breathing is improving with diuresis.  At this time, she denies chest pain or any new concerns.     Marland Kitchen amLODipine  10 mg Oral Daily  . antiseptic oral rinse  15 mL Mouth Rinse BID  . aspirin EC  81 mg Oral Daily  . estradiol  0.5 mg Oral Daily  . furosemide  40 mg Intravenous BID  . gabapentin  100 mg Oral TID  . hydrALAZINE  25 mg Oral Q8H  . insulin aspart  0-15 Units Subcutaneous TID WC  . ipratropium  0.5 mg Nebulization Q6H  . lisinopril  40 mg Oral Daily  . metoCLOPramide  10 mg Oral Daily  . metoprolol succinate  50 mg Oral Daily  . nitroGLYCERIN  0.5 inch Topical Q6H  . olopatadine  1 drop Both Eyes BID  . pantoprazole sodium  40 mg Oral Daily  . phenytoin  300 mg Oral Daily  . potassium chloride  10 mEq Oral Daily  . potassium chloride  40 mEq Oral Once  . simvastatin  20 mg Oral q1800  . sodium chloride  3 mL Intravenous Q12H      . DISCONTD: heparin 12.5 mL/hr (11/21/11 0500)    OBJECTIVE: Physical Exam: Filed Vitals:   11/20/11 2144 11/20/11 2218 11/21/11 0500 11/21/11 0614  BP: 111/66 123/72 125/71 124/64  Pulse: 62  63   Temp: 97.8 F (36.6 C)  98 F (36.7 C)   TempSrc: Oral  Oral   Resp: 18  20   Height:      Weight:   248 lb 0.3 oz (112.5 kg)   SpO2: 94%  95%     Intake/Output Summary (Last 24 hours) at 11/21/11 0741 Last data filed at 11/21/11 0600  Gross per 24 hour  Intake   2030 ml  Output   1490 ml  Net    540 ml    Telemetry reveals A pacing  GEN- The patient is obese and chronically ill appearing, alert and oriented x 3 today.   Head- normocephalic, atraumatic Eyes-  Sclera clear, conjunctiva pink Ears- hearing intact Oropharynx- clear Neck- supple, no JVP Lymph- no cervical lymphadenopathy Lungs- decreased BS at bases, normal work of breathing Heart- Regular rate and rhythm,   GI- soft, NT, ND, + BS Extremities- no clubbing, cyanosis, or edema Skin- no rash or lesion Psych-  euthymic mood, full affect Neuro- strength and sensation are intact  LABS: Basic Metabolic Panel:  Basename 11/20/11 0605 11/18/11 1110  NA 138 143  K 3.7 4.2  CL 101 107  CO2 24 24  GLUCOSE 124* 79  BUN 12 19  CREATININE 0.66 0.76  CALCIUM 9.5 9.4  MG -- --  PHOS -- --   Liver Function Tests:  Basename 11/18/11 1110  AST 26  ALT 21  ALKPHOS 80  BILITOT 0.1*  PROT 7.3  ALBUMIN 3.4*   No results found for this basename: LIPASE:2,AMYLASE:2 in the last 72 hours CBC:  Basename 11/21/11 0630 11/20/11 0605 11/18/11 1110  WBC 8.5 10.2 --  NEUTROABS -- -- 6.3  HGB 10.4* 11.0* --  HCT 32.3* 34.8* --  MCV 89.7 90.9 --  PLT 172 204 --   Cardiac Enzymes:  Basename 11/19/11 1600 11/18/11 1745 11/18/11 1110  CKTOTAL 50 57 57  CKMB 2.0 2.2 2.3  CKMBINDEX -- -- --  TROPONINI <0.30 <0.30 <0.30   BNP: No components found with this basename: POCBNP:3 D-Dimer:  Alvira Philips 11/18/11  1110  DDIMER 1.25*   Hemoglobin A1C: No results found for this basename: HGBA1C in the last 72 hours Fasting Lipid Panel:  Basename 11/19/11 0645  CHOL 188  HDL 63  LDLCALC 98  TRIG 135  CHOLHDL 3.0  LDLDIRECT --   Thyroid Function Tests:  Basename 11/18/11 1110  TSH 0.546  T4TOTAL --  T3FREE --  THYROIDAB --   Anemia Panel: No results found for this basename: VITAMINB12,FOLATE,FERRITIN,TIBC,IRON,RETICCTPCT in the last 72 hours  Nm Myocar Multi W/spect W/wall Motion / Ef  11/20/2011  *RADIOLOGY REPORT*  Clinical Data:  Chest pain, history of CAD and myocardial infarction, hypertension, hypercholesterolemia  MYOCARDIAL IMAGING WITH SPECT (REST AND PHARMACOLOGIC-STRESS - 2 DAY PROTOCOL) GATED LEFT VENTRICULAR WALL MOTION STUDY LEFT VENTRICULAR EJECTION FRACTION  Technique:  Standard myocardial SPECT imaging was performed after intravenous injection of 10 mCi Tc-49m tetrofosmin at rest.  On a different day, intravenous infusion of  Lexiscan was performed under supervision of the  Cardiology staff.  At peak effect of the drug, 30 mCi Tc-82m tetrofosmin was injected intravenously and standard myocardial SPECT imaging was performed.  Quantitative gated imaging was also performed to evaluate left ventricular wall motion and estimate left ventricular ejection fraction.  Comparison:  Nuclear cardiac stress test - 01/21/2011  Findings:  SPECT imaging demonstrates homogeneous distribution of radiotracer. The previously described area of potential attenuation versus minimal ischemia involving the posterior lateral wall is not demonstrated on this examination.  No discrete areas of decreased radiotracer uptake to suggest prior infarction or pharmacologically induced ischemia.  Quantitative gated analysis shows normal wall motion.  The resting left ventricular ejection fraction is 62% with end- diastolic volume of 81 ml and end-systolic volume of 31 ml.  IMPRESSION:  1.  No scintigraphic evidence of prior infarction or pharmacologically induced ischemia.  2.  Normal wall motion.  Ejection fraction - 62%, unchanged from prior examination performed 01/2010.  Original Report Authenticated By: Waynard Reeds, M.D.    ASSESSMENT AND PLAN:  Active Problems:  Acute on chronic diastolic congestive heart failure  1. Atypical chest pain- resolved,  Normal myoview, no further cardiac testing 2. Acute on chronic diastolic dysfunction- improving, increase lasix to 40mg  BID at discharge 3. Obesity- likely contributes to chronic dyspnea and atypical chest pain,  Weight loss advised  DC to home today,  Follow-up with Tereso Newcomer in 1-2 weeks   Hillis Range, MD 11/21/2011 7:41 AM

## 2011-12-01 ENCOUNTER — Telehealth: Payer: Self-pay | Admitting: Internal Medicine

## 2011-12-01 NOTE — Telephone Encounter (Signed)
Spoke with patient.

## 2011-12-01 NOTE — Telephone Encounter (Signed)
New Problem:    Patient called and both she and her nurse wanted to know why when she was in the hospital Dr. Johney Frame took her off the meloxicam she had. Also wants to know if she could go back on it because she is in tremendous pain in her back and legs.

## 2011-12-01 NOTE — Telephone Encounter (Signed)
All of her medications are not correct  She is going to come over tomorrow at 9:00am to get her medications corrected

## 2011-12-02 NOTE — Telephone Encounter (Signed)
Patient came in and we went over all her medications  She has one she is not sure if she has at home  It is the Hydralazine  She is going to check  If she needs this called in she will call me back and I will take care of that for her

## 2011-12-08 ENCOUNTER — Encounter: Payer: PRIVATE HEALTH INSURANCE | Admitting: Physician Assistant

## 2011-12-09 ENCOUNTER — Encounter: Payer: Self-pay | Admitting: Physician Assistant

## 2011-12-12 ENCOUNTER — Encounter: Payer: PRIVATE HEALTH INSURANCE | Admitting: Physician Assistant

## 2011-12-12 ENCOUNTER — Ambulatory Visit (HOSPITAL_COMMUNITY)
Admission: RE | Admit: 2011-12-12 | Discharge: 2011-12-12 | Disposition: A | Discharge status: Home / Self Care | Payer: PRIVATE HEALTH INSURANCE | Source: Ambulatory Visit | Attending: Pulmonary Disease | Admitting: Pulmonary Disease

## 2011-12-12 ENCOUNTER — Other Ambulatory Visit (HOSPITAL_COMMUNITY): Payer: Self-pay | Admitting: Pulmonary Disease

## 2011-12-12 DIAGNOSIS — M545 Low back pain, unspecified: Secondary | ICD-10-CM | POA: Insufficient documentation

## 2011-12-12 DIAGNOSIS — R52 Pain, unspecified: Secondary | ICD-10-CM

## 2011-12-12 DIAGNOSIS — M25559 Pain in unspecified hip: Secondary | ICD-10-CM | POA: Insufficient documentation

## 2011-12-12 DIAGNOSIS — M5137 Other intervertebral disc degeneration, lumbosacral region: Secondary | ICD-10-CM | POA: Insufficient documentation

## 2011-12-12 DIAGNOSIS — M51379 Other intervertebral disc degeneration, lumbosacral region without mention of lumbar back pain or lower extremity pain: Secondary | ICD-10-CM | POA: Insufficient documentation

## 2011-12-15 ENCOUNTER — Telehealth (HOSPITAL_COMMUNITY): Payer: Self-pay | Admitting: Dietician

## 2011-12-15 ENCOUNTER — Ambulatory Visit (INDEPENDENT_AMBULATORY_CARE_PROVIDER_SITE_OTHER): Payer: PRIVATE HEALTH INSURANCE | Admitting: Physician Assistant

## 2011-12-15 ENCOUNTER — Encounter: Payer: Self-pay | Admitting: Physician Assistant

## 2011-12-15 VITALS — BP 136/70 | HR 62 | Resp 20 | Ht 61.0 in | Wt 253.2 lb

## 2011-12-15 DIAGNOSIS — I5032 Chronic diastolic (congestive) heart failure: Secondary | ICD-10-CM

## 2011-12-15 DIAGNOSIS — I10 Essential (primary) hypertension: Secondary | ICD-10-CM

## 2011-12-15 DIAGNOSIS — I2510 Atherosclerotic heart disease of native coronary artery without angina pectoris: Secondary | ICD-10-CM

## 2011-12-15 DIAGNOSIS — E785 Hyperlipidemia, unspecified: Secondary | ICD-10-CM

## 2011-12-15 DIAGNOSIS — I509 Heart failure, unspecified: Secondary | ICD-10-CM

## 2011-12-15 DIAGNOSIS — J449 Chronic obstructive pulmonary disease, unspecified: Secondary | ICD-10-CM

## 2011-12-15 DIAGNOSIS — D649 Anemia, unspecified: Secondary | ICD-10-CM

## 2011-12-15 NOTE — Assessment & Plan Note (Signed)
She knows to follow up with her PCP for further evaluation.

## 2011-12-15 NOTE — Telephone Encounter (Signed)
Appointment scheduled for 12/16/11 at 8:00 AM.

## 2011-12-15 NOTE — Assessment & Plan Note (Signed)
Volume appears stable.  Check BMET today to follow up on renal fxn and K+.  We discussed weighing daily and she knows to call if she has increased dyspnea or increase in weight by 2-3 pounds in one day.

## 2011-12-15 NOTE — Assessment & Plan Note (Signed)
Arrange follow up FLP and LFTs in 6 weeks.

## 2011-12-15 NOTE — Progress Notes (Signed)
[address] [place], [place]  [address] Phone: [phone] Fax:  [phone]  Date:  12/15/2011   Name:  [name]       DOB:  09-08-1941 MRN:  [id number]  PCP:  Dr. Juanetta Gosling Primary Cardiologist:  Dr. Hillis Range  Primary Electrophysiologist:  Dr. Hillis Range    History of Present Illness: Christine [name] is a 71 y.o. female who presents for post hospital follow up.  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  Myoview 2/11: EF 62%, fixed septal defect consistent with breast attenuation, minimal inferolateral reversible defect (attenuation versus mild ischemia).  Last echocardiogram 2/11: Mild-moderate LVH, EF 65-70%, MAC.  Last LHC 1/12:  EF 60%, mLAD 80%, D1 and D2 small with severe disease, mCFX and RCA occluded, S-PDA ok, S-OM ok, L-LAD ok (grafts patent and med tx continued).  She underwent pacemaker generator change in early 2012.  She has chronic shortness of breath.  She also has chronic angina.  She was admitted from the office 12/14 with complaints of chest pain.  MI was ruled out.  Her d-dimer was abnormal.  Lower extremity Dopplers were negative for DVT.  Risks for PE were felt to be low and no further workup was recommended.  She did have volume overload and was diuresed.  She underwent inpatient Myoview testing 11/20/11: No scar or ischemia, EF 62%.  She was noted to have anemia and it was recommended that she followup with her PCP.  Lipitor was added to her regimen.  Labs: Hgb 10.4, K 3.5, creatinine 0.74, ALT 21, TC 188, TG 135, HDL 63, LDL 98.  CXR: CE, no chf.    Doing ok.  Still has significant DOE.  Notes + wheeze and nonproductive cough.  No orthopnea, PND or significant change in edema.  No further chest pain.  No syncope.  Weighs daily.  Has appt next week with PCP.    Past  Medical History  Diagnosis Date  . Diabetes mellitus   . HTN (hypertension)   . Seizure disorder   . CAD (coronary artery disease)     multivessel, CABG 6/10.  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  Myoview (11/20/11) showed no ischemia & EF 62%  . GERD (gastroesophageal reflux disease)   . Hyperlipidemia   . DVT (deep venous thrombosis)     R arm venous occlusion s/p prior PPM  . Obesity   . DDD (degenerative disc disease)   . Complete heart block 1993    s/p PPM, most recent generator change 2003  . Dehiscence of closure of sternum or sternotomy 8/10    required flap  . COPD (chronic obstructive pulmonary disease)     severe lung disease by PFTs 6/12  . Asthma   . Sleep apnea   . Chronic kidney disease   . Wound infection     Sternal  . H/O: hysterectomy     Current Outpatient Prescriptions  Medication Sig Dispense Refill  . amLODipine (NORVASC) 10 MG tablet Take 1 tablet (10 mg total) by mouth daily.  30 tablet  6  . aspirin 81 MG tablet Take 1 tablet (81 mg total) by mouth daily.      Marland Kitchen atorvastatin (LIPITOR) 40 MG tablet Take 1 tablet (40 mg total) by mouth at bedtime.  30 tablet  6  . esomeprazole (NEXIUM) 40 MG  capsule Take 40 mg by mouth daily before breakfast.       . estradiol (ESTRACE) 0.5 MG tablet Take 0.5 mg by mouth daily.        . fish oil-omega-3 fatty acids 1000 MG capsule Take 1 g by mouth 2 (two) times daily.        . furosemide (LASIX) 40 MG tablet Take 1 tablet (40 mg total) by mouth 2 (two) times daily. This is a new increased dose.  60 tablet  6  . gabapentin (NEURONTIN) 100 MG tablet Take 100 mg by mouth 3 (three) times daily.        . hydrALAZINE (APRESOLINE) 25 MG tablet Take 1 tablet (25 mg total) by mouth every 8 (eight) hours.  90 tablet  6  . HYDROcodone-acetaminophen (VICODIN) 5-500 MG per tablet Take 1 tablet by mouth every 6 (six) hours as needed. For pain       . lisinopril (PRINIVIL,ZESTRIL) 40 MG tablet Take 40 mg by mouth daily.        . metFORMIN  (GLUCOPHAGE) 500 MG tablet Take 500 mg by mouth daily.        . metoCLOPramide (REGLAN) 10 MG tablet Take 10 mg by mouth every morning.        . metoprolol succinate (TOPROL-XL) 50 MG 24 hr tablet Take 1 tablet (50 mg total) by mouth daily.  30 tablet  6  . nitroGLYCERIN (NITROSTAT) 0.4 MG SL tablet Place 1 tablet (0.4 mg total) under the tongue every 5 (five) minutes as needed for chest pain (Up to 3 doses).  25 tablet  3  . NON FORMULARY Oxygen at night. 2 litters       . olopatadine (PATANOL) 0.1 % ophthalmic solution Place 1 drop into both eyes 2 (two) times daily.        . phenytoin (DILANTIN) 100 MG ER capsule Take 300 mg by mouth at bedtime.        . potassium chloride (KLOR-CON) 10 MEQ CR tablet Take 1 tablet (10 mEq total) by mouth 2 (two) times daily.        Allergies: Allergies  Allergen Reactions  . Codeine     "good" headache  . Oxycodone Hcl     "good" headache    History  Substance Use Topics  . Smoking status: Former Games developer  . Smokeless tobacco: Never Used  . Alcohol Use: No    ROS:  Please see the history of present illness.   Notes significant back pain.  Has had xrays and will see PCP back next week.   All other systems reviewed and negative.   PHYSICAL EXAM: VS:  BP 136/70  Pulse 62  Resp 20  Ht 5\' 1"  (1.549 m)  Wt 253 lb 3.2 oz (114.851 kg)  BMI 47.84 kg/m2 Well nourished, well developed, in no acute distress HEENT: normal Neck: no JVD At 90 degrees Cardiac:  distant S1, S2; RRR;  Lungs:  Decreased breath sounds bilaterally, no wheezing, rhonchi or rales Abd: soft, nontender, no hepatomegaly Ext: Trace bilateral ankle edema Skin: warm and dry Neuro:  CNs 2-12 intact, no focal abnormalities noted Psych: Normal affect  EKG:    Atrial paced, heart rate 62, left axis deviation, nonspecific ST-T wave changes  ASSESSMENT AND PLAN:

## 2011-12-15 NOTE — Telephone Encounter (Signed)
Pt met with Hart Rochester at [facility] on 12/13/11. Per Diane, pt has gone through the cardiac rehab program in the past. Pt was referred to Diane from Dr. Emelda Fear due to significant weight gain. Diane also reported that Dr. Emelda Fear requested RD consult for obesity and increased weight.  Met with pt on 12/13/11. Pt reports she does not have her calendar and is unsure of her availability for RD appointment. Pt requests making appointment later when she has access to her calendar. Provided RD contact information and obtained pt contact information.

## 2011-12-15 NOTE — Assessment & Plan Note (Signed)
Recent myoview low risk and LHC in 1/12 with patent grafts.  No angina.  Continue aspirin and statin.  Follow up with Dr. Hillis Range in 3 mos.

## 2011-12-15 NOTE — Assessment & Plan Note (Signed)
Suspect this is a major reason for her dyspnea.  Management per PCP.

## 2011-12-15 NOTE — Assessment & Plan Note (Signed)
Borderline control. 

## 2011-12-15 NOTE — Patient Instructions (Signed)
Please have blood work done today.  BMET; Dx: 401.1  Your physician recommends that you have lab work done in 6 weeks.  LFTs and Lipid Profile.  You need to fast for these tests.  These can be done at your Primary Care Physicians' office.  A prescription has been provided for this.    Your physician recommends that you schedule a follow-up appointment in: 3 months with Dr. Johney Frame  Your physician recommends that you continue on your current medications as directed. Please refer to the Current Medication list given to you today.

## 2011-12-16 ENCOUNTER — Encounter (HOSPITAL_COMMUNITY): Payer: Self-pay | Admitting: Dietician

## 2011-12-16 NOTE — Progress Notes (Addendum)
Outpatient Initial Nutrition Assessment  Date:12/16/2011   Time: 8:00 AM  Referring Physician: Dr. Ellin Saba (Cardiac Rehab) Reason for Visit: obesity, significant weight gain  Nutrition Assessment:  Ht: 61" Wt: 252# IBW: 105# %IBW: 240% UBW: 252# %UBW: 100% BMI: 47.61 Goal Weight: 227# (10% weight loss) Weight hx: Pt reports that she weighs herself daily, per instructions from her cardiologist. She reports UBW: 252 is the highest she has ever weighed. She reports that she has gained a significant amount of weight in the past 2 years, but is unable to quantify how much. She reports weight changes due to fluid retention and decreased physical activity due to knee and hip pain.   Estimated nutritional needs:  1700-1870 kcals daily, 92-115 grams protein daily, 1.7-1.9 L fluid daily  PMH:  Past Medical History  Diagnosis Date  . Diabetes mellitus   . HTN (hypertension)   . Seizure disorder   . CAD (coronary artery disease)     multivessel, CABG 6/10.  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  Myoview (11/20/11) showed no ischemia & EF 62%  . GERD (gastroesophageal reflux disease)   . Hyperlipidemia   . DVT (deep venous thrombosis)     R arm venous occlusion s/p prior PPM  . Obesity   . DDD (degenerative disc disease)   . Complete heart block 1993    s/p PPM, most recent generator change 2003  . Dehiscence of closure of sternum or sternotomy 8/10    required flap  . COPD (chronic obstructive pulmonary disease)     severe lung disease by PFTs 6/12  . Asthma   . Sleep apnea   . Chronic kidney disease   . Wound infection     Sternal  . H/O: hysterectomy     Medications:  Current Outpatient Rx  Name Route Sig Dispense Refill  . AMLODIPINE BESYLATE 10 MG PO TABS Oral Take 1 tablet (10 mg total) by mouth daily. 30 tablet 6  . ASPIRIN 81 MG PO TABS Oral Take 1 tablet (81 mg total) by mouth daily.    . ATORVASTATIN CALCIUM 40 MG PO TABS Oral Take 1 tablet (40 mg total) by mouth at bedtime. 30  tablet 6  . ESOMEPRAZOLE MAGNESIUM 40 MG PO CPDR Oral Take 40 mg by mouth daily before breakfast.     . ESTRADIOL 0.5 MG PO TABS Oral Take 0.5 mg by mouth daily.      . OMEGA-3 FATTY ACIDS 1000 MG PO CAPS Oral Take 1 g by mouth 2 (two) times daily.      . FUROSEMIDE 40 MG PO TABS Oral Take 1 tablet (40 mg total) by mouth 2 (two) times daily. This is a new increased dose. 60 tablet 6  . GABAPENTIN 100 MG PO TABS Oral Take 100 mg by mouth 3 (three) times daily.      Marland Kitchen HYDRALAZINE HCL 25 MG PO TABS Oral Take 1 tablet (25 mg total) by mouth every 8 (eight) hours. 90 tablet 6  . HYDROCODONE-ACETAMINOPHEN 5-500 MG PO TABS Oral Take 1 tablet by mouth every 4 (four) hours as needed. For pain    . LISINOPRIL 40 MG PO TABS Oral Take 40 mg by mouth daily.      Marland Kitchen METFORMIN HCL 500 MG PO TABS Oral Take 500 mg by mouth daily.      Marland Kitchen METOCLOPRAMIDE HCL 10 MG PO TABS Oral Take 10 mg by mouth every morning.      Marland Kitchen METOPROLOL SUCCINATE ER 50 MG PO  TB24 Oral Take 1 tablet (50 mg total) by mouth daily. 30 tablet 6  . NITROGLYCERIN 0.4 MG SL SUBL Sublingual Place 1 tablet (0.4 mg total) under the tongue every 5 (five) minutes as needed for chest pain (Up to 3 doses). 25 tablet 3  . NON FORMULARY  Oxygen at night. 2 litters     . OLOPATADINE HCL 0.1 % OP SOLN Both Eyes Place 1 drop into both eyes 2 (two) times daily.      Marland Kitchen PHENYTOIN SODIUM EXTENDED 100 MG PO CAPS Oral Take 300 mg by mouth at bedtime.      Marland Kitchen POTASSIUM CHLORIDE 10 MEQ PO TBCR Oral Take 1 tablet (10 mEq total) by mouth 2 (two) times daily.      Labs:  CMP     Component Value Date/Time   NA 138 11/21/2011 0630   K 3.5 11/21/2011 0630   CL 102 11/21/2011 0630   CO2 26 11/21/2011 0630   GLUCOSE 122* 11/21/2011 0630   BUN 12 11/21/2011 0630   CREATININE 0.74 11/21/2011 0630   CALCIUM 9.1 11/21/2011 0630   PROT 7.3 11/18/2011 1110   ALBUMIN 3.4* 11/18/2011 1110   AST 26 11/18/2011 1110   ALT 21 11/18/2011 1110   ALKPHOS 80 11/18/2011 1110    BILITOT 0.1* 11/18/2011 1110   GFRNONAA 83* 11/21/2011 0630   GFRAA >90 11/21/2011 0630     Lipid Panel     Component Value Date/Time   CHOL 188 11/19/2011 0645   TRIG 135 11/19/2011 0645   HDL 63 11/19/2011 0645   CHOLHDL 3.0 11/19/2011 0645   VLDL 27 11/19/2011 0645   LDLCALC 98 11/19/2011 0645     Lab Results  Component Value Date   HGBA1C  Value: 5.8 (NOTE) The ADA recommends the following therapeutic goal for glycemic control related to Hgb A1c measurement: Goal of therapy: <6.5 Hgb A1c  Reference: American Diabetes Association: Clinical Practice Recommendations 2010, Diabetes Care, 2010, 33: (Suppl  1). 05/27/2009   Lab Results  Component Value Date   LDLCALC 98 11/19/2011   CREATININE 0.74 11/21/2011     Nutrition hx/habits: Ms. Happ is a very pleasant lady who complains of significant weight gain in the past 2 years. She expresses frustration with weight gain and is concerned about how obesity affects her other health issues. She is a retired Research officer, political party. She lives in [place], [place] with her husband, daughter, son-in-law, and 38 year old granddaughter. Her daughter and son-in-law prepare the meals and do the grocery shopping. Per pt report, they follow heart healthy, diabetic guidelines; they limit canned foods, bake meats, take skin and fat off meats, use heart healthy oils and sprays, do not use salt, and drink unsweetened beverages. Pt also receives Meals on Wheels. They seldom eat out (less than 6-8 times a year). Pt reports she was referred to the cardiac rehab program 3 years ago and was 2 classes shy of completing the program; she was unable to finish due to severe knee pain. She reports that he knee pain and limited mobility prevent her from participating in a structured exercise program. 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 Pt was recently discharged on 11/25/11 from [facility] with CHF; she now receives home health PT 2 times per week.  She reports that she met with Hart Rochester re: recommended exercises. She denies any stress in her life and she is very strong in her faith. She reports that  she has recently given up desserts and is frustrated with lack of weight loss.  CBGs good; pt checks 2 times daily and, per pt report, readings range from 100-120. She also complains of difficulty chewing hard foods, like raw fruit, due to several missing teeth; pt has a dental appointment to pick up dentures today.   Diet recall: Breakfast (6:30 AM): plain grits, boiled egg OR whole what toast with jelly, coffee with cream and sweet and low; Lunch (12:00 PM): Meals on wheels (vegetable (broccoli, string beans), meat (beef, chicken, BBQ), fruit), water; Dinner (5 PM): hamburger steak, mashed potatoes with gravy, dinner roll, string beans, unsweetened tea  Nutrition Diagnosis: Involuntary weight gain r/t physical inactivity due to knee pain AEB BMI: 47.61, hx of significant wt gain.  Nutrition Intervention: Nutrition rx: 1200 kcal diabetic, heart healthy diet; low calorie beverages only; no snacks; no sweets; physical activity as tolerated  Education/Counseling Provided: Educated pt on plate method and portion control. Also discussed heart healthy, diabetic food preparation methods and food choices. Discussed importance of physical activity to assist with weight loss. Discussed slow moderate weight loss and long term vs short term goal. Provided plate method handout.   Understanding, Motivation, Ability to Follow Recommendations: Expect fair to good compliance.   Monitoring and Evaluation: Goals: 1) 1-2# weight loss per week; 2) Physical activity as tolerated  Recommendations: 1) 1200 kcal daily for weight loss; 2) Physical activity as tolerated- work with therapist; 3) Weigh at same time daily; 4) Don't become discouraged, weight loss is a long term goal (recommend 10% weight loss- goal of 227# (-25#) of current weight)  F/U: PRN. Provided RD  contact information.   Orlene Plum, RD  12/16/2011  Time: 8:00 AM

## 2011-12-19 LAB — BASIC METABOLIC PANEL
BUN: 17 mg/dL (ref 6–23)
CO2: 25 mEq/L (ref 19–32)
Chloride: 101 mEq/L (ref 96–112)
Creatinine, Ser: 0.9 mg/dL (ref 0.4–1.2)
Potassium: 3.9 mEq/L (ref 3.5–5.1)

## 2012-01-04 ENCOUNTER — Telehealth: Payer: Self-pay | Admitting: Internal Medicine

## 2012-01-15 IMAGING — CR DG LUMBAR SPINE 2-3V
3 series · 3 of 3 positions shown · non-contrast
Comparison: 05/17/2010

CLINICAL DATA: Status post lumbar fusion

LUMBAR SPINE - 2-3 VIEW

[t l-spine a.p.]
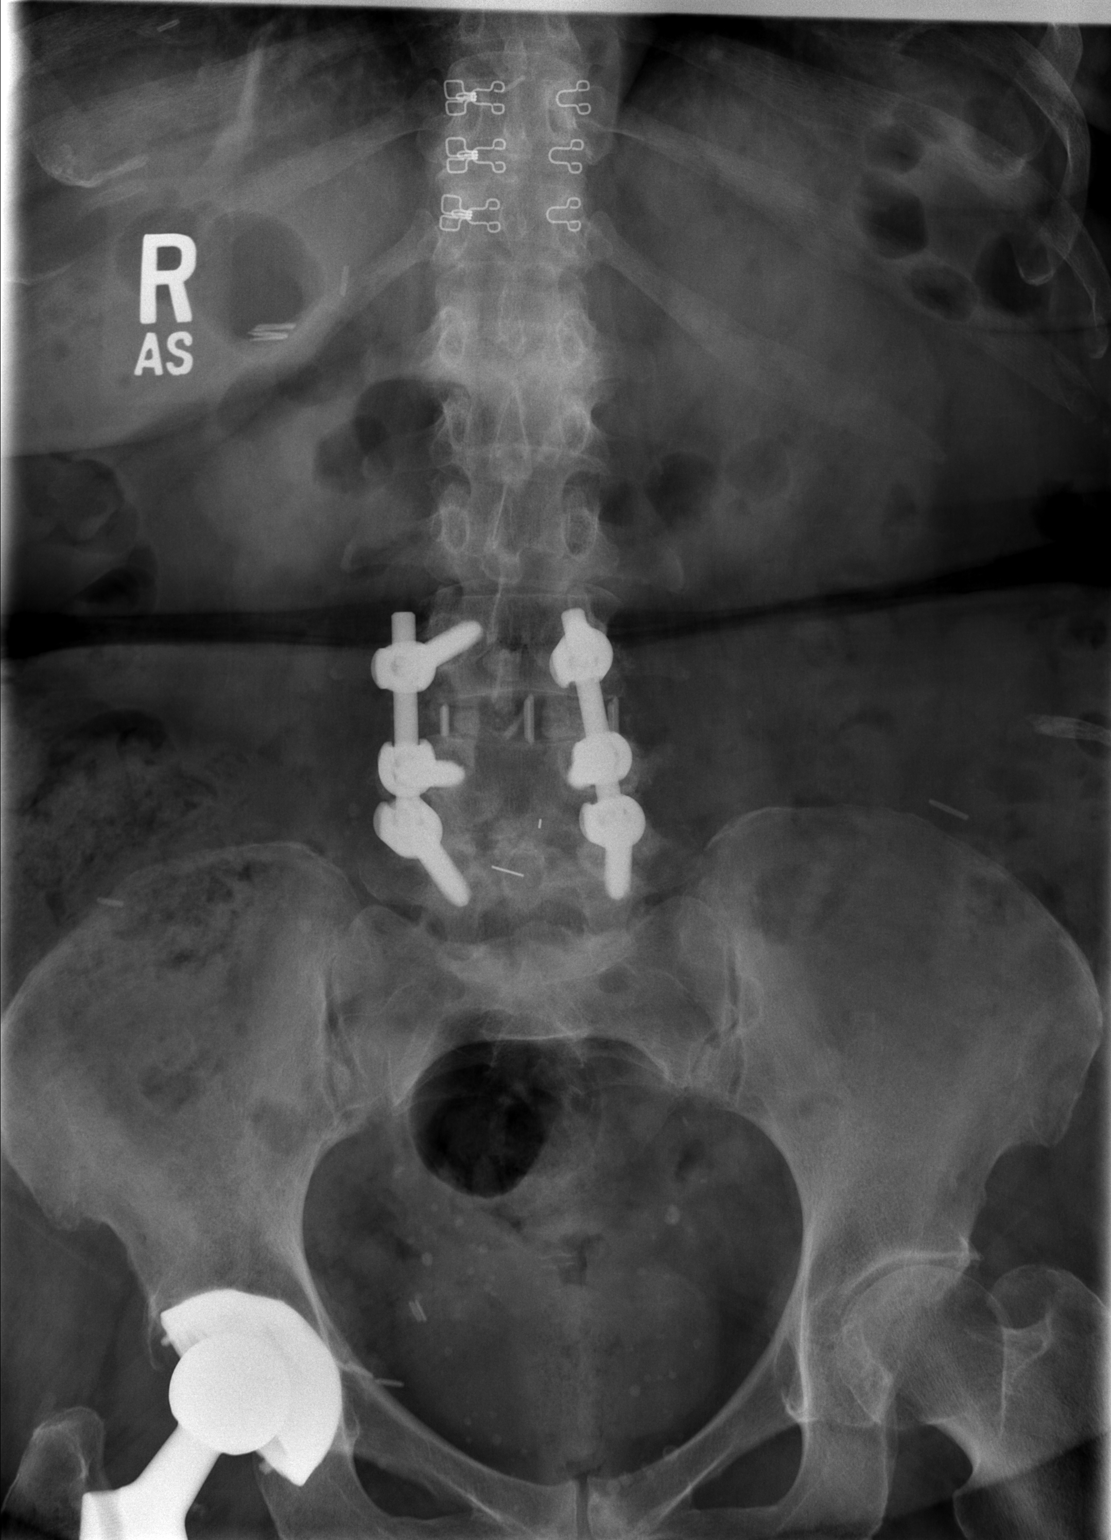

[t l-spine lat]
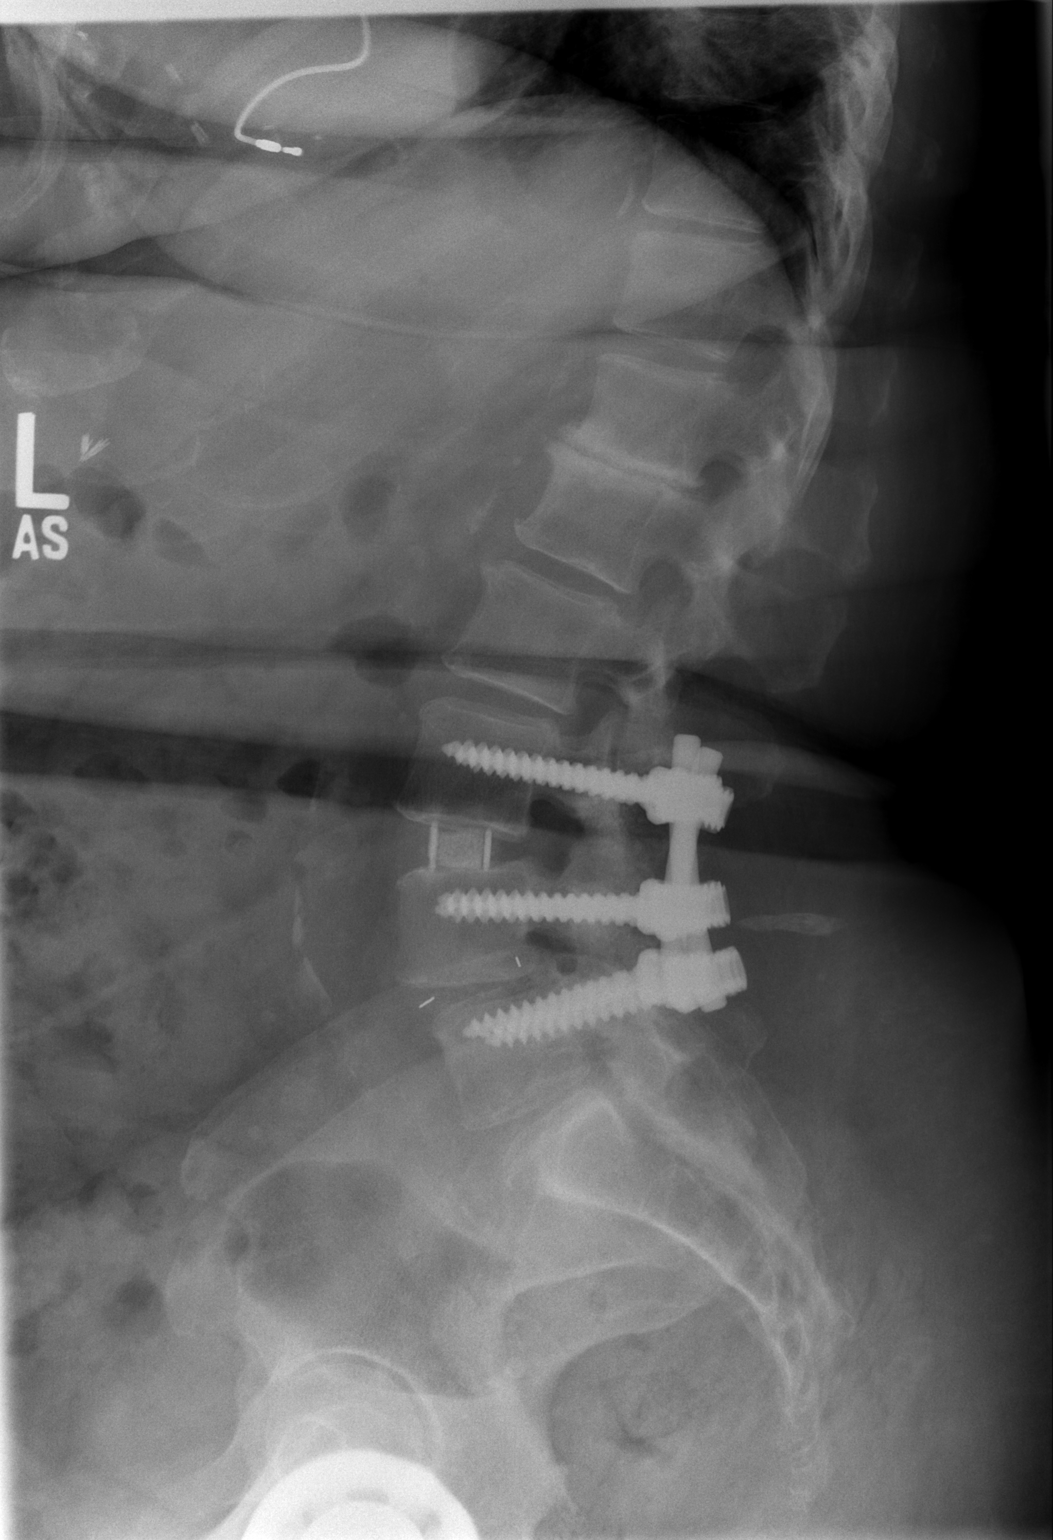

[t l-spine l5-s1 spot]
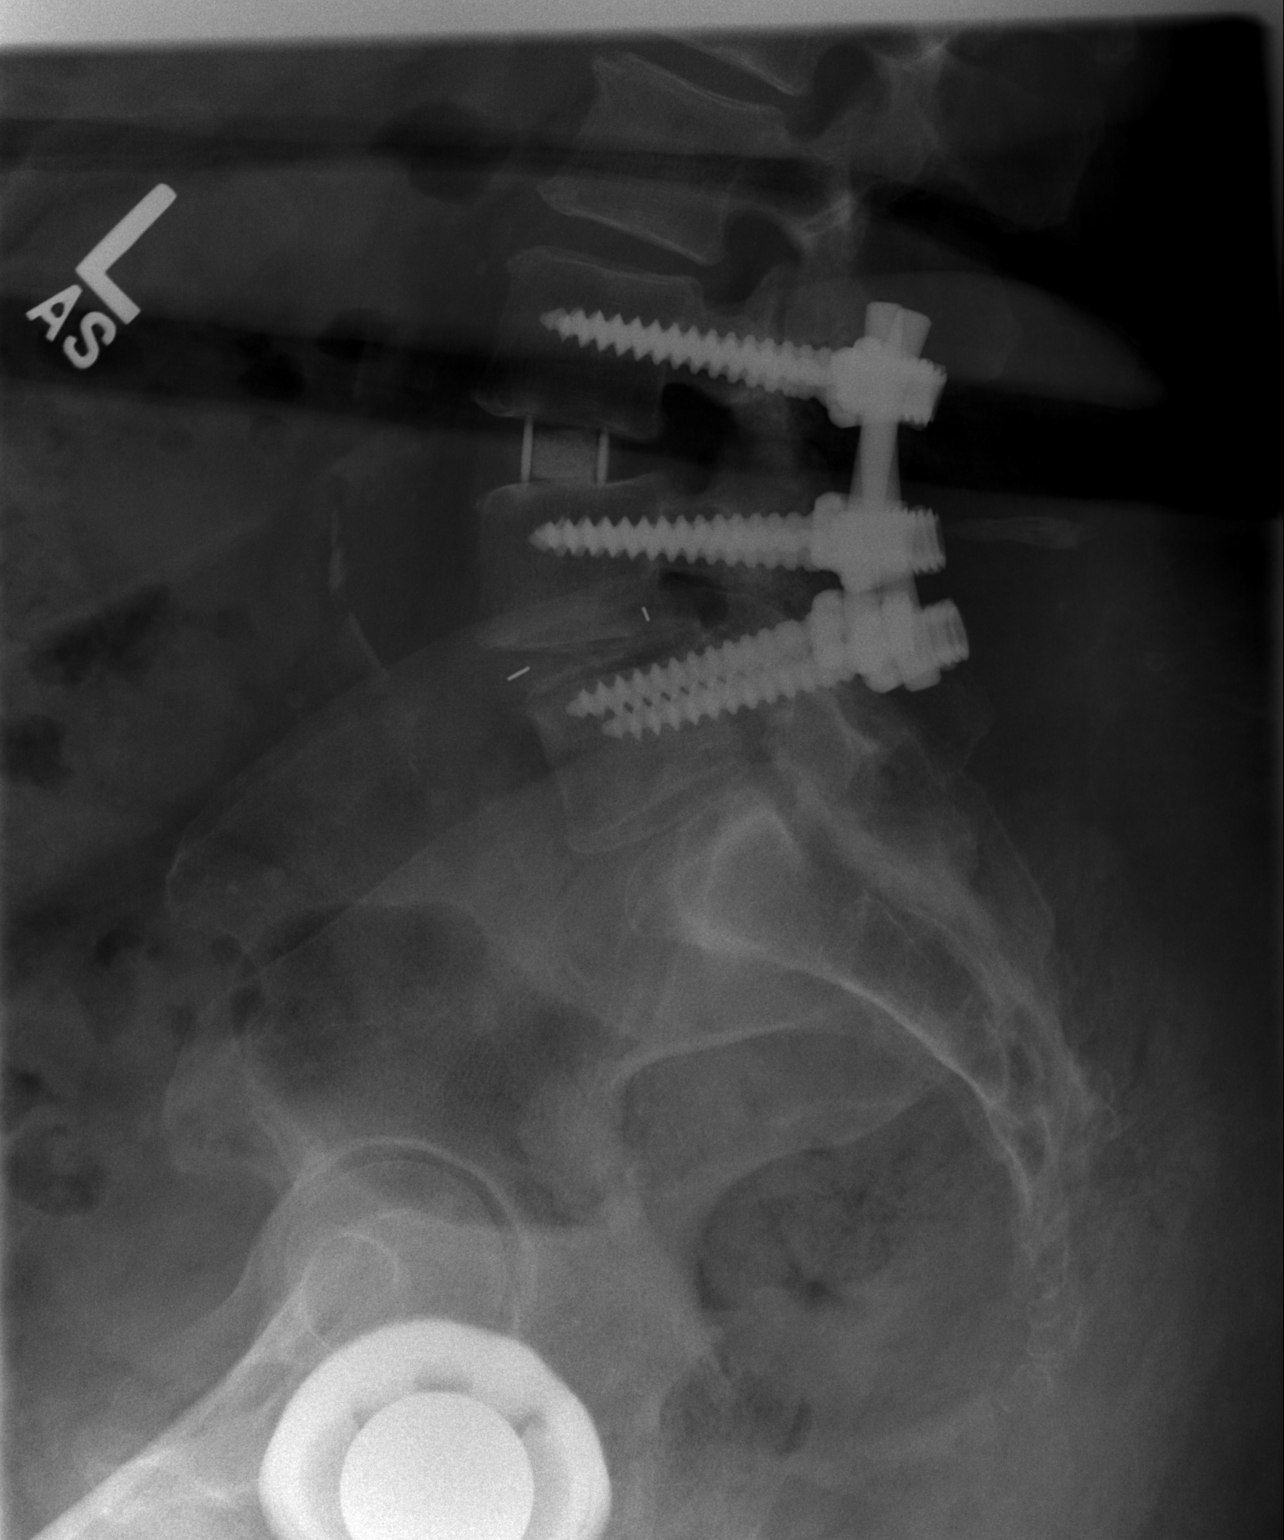

[3 of 3 positions shown; findings below may reference images not displayed]

FINDINGS: Bilateral pedicle screws and L3, L4, and L5 are stable.
Alignment is stable.  The large spacer in the L3-4 disc is stable
per position and appearance.  A spacer in the L4-5 disc is also
unchanged in position.  No acute compression deformity.  There is
marked narrowing of the T12-L1 disc with endplate sclerosis.
IMPRESSION: Stable L3-L5 fusion without complication.

## 2012-01-18 ENCOUNTER — Telehealth: Payer: Self-pay | Admitting: Internal Medicine

## 2012-01-18 NOTE — Telephone Encounter (Signed)
New Msg: [facility] calling to inform MD/nurse that she is d/c pt from care. Pt has been stable and today is her last scheduled visit. Please return call to discuss further if d/c is not approved/suggested.

## 2012-01-19 IMAGING — CT CT L SPINE W/O CM
3 of 5 series · 12 of 33 positions shown, 15 images · non-contrast
Comparison: Radiographs 07/19/2010 and lumbar spine CT 12/28/2009.

CLINICAL DATA: Progressive low back pain after fusion 04/07/2010.

CT LUMBAR SPINE WITHOUT CONTRAST
TECHNIQUE: Multidetector CT imaging of the lumbar spine was
performed without intravenous contrast administration. Multiplanar
CT image reconstructions were also generated.

[Series 3: lumbar spine 2.0 b30s · axial · 0.29mm/px · z∈[-220,-78]mm · 6 of 94 slices shown, 8 images]
[im 15/94  soft-tissue]
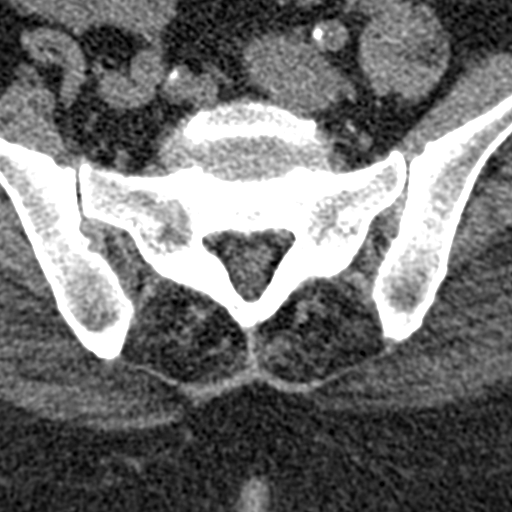
[im 15/94  bone]
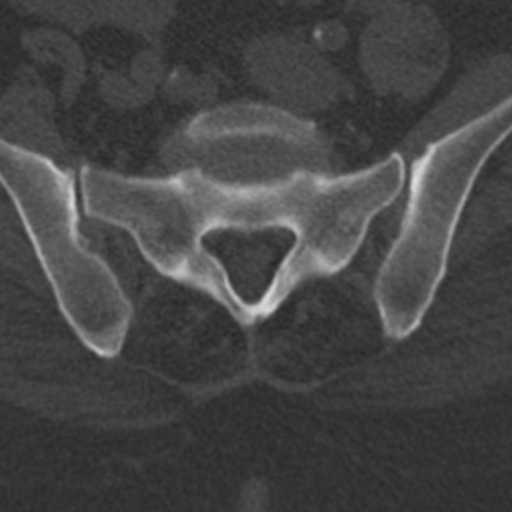
[im 29/94  bone]
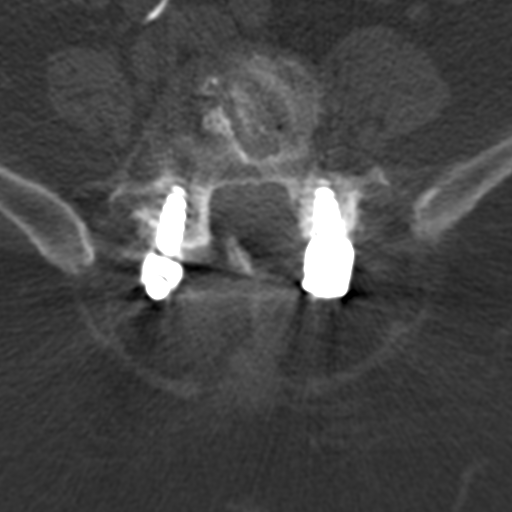
[im 43/94  bone]
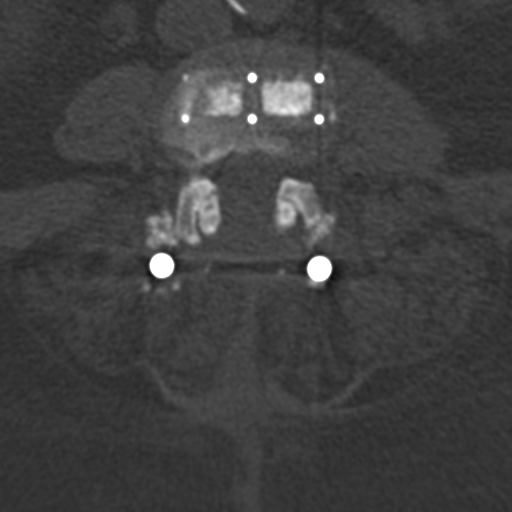
[im 58/94  bone]
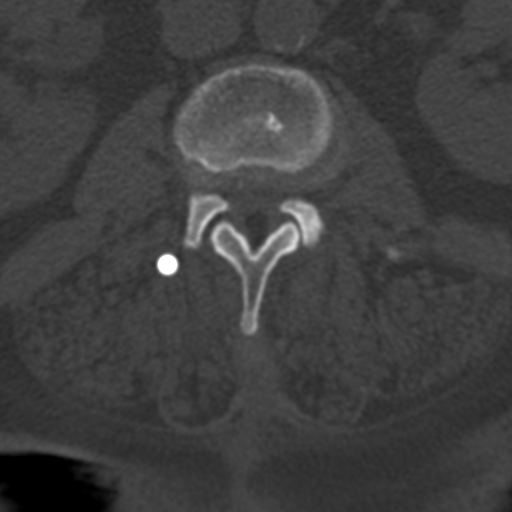
[im 72/94  soft-tissue]
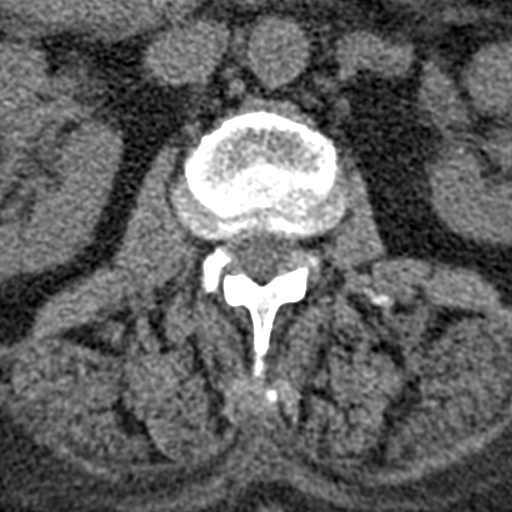
[im 72/94  bone]
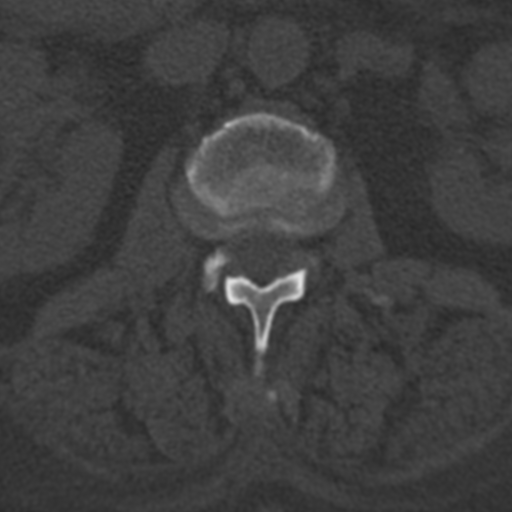
[im 86/94  bone]
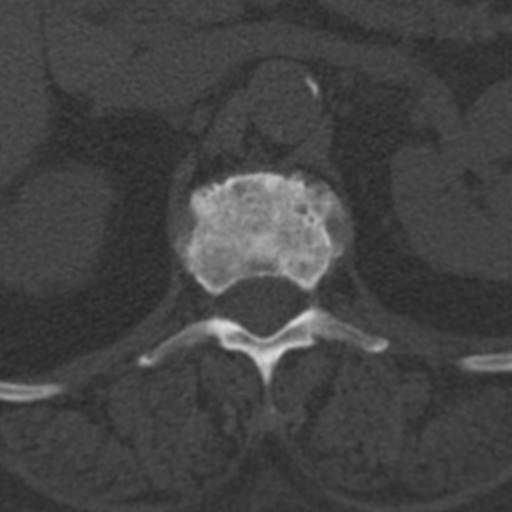

[Series 4: lumbar spine 2.0 spo cor · coronal · 0.25mm/px · 1 of 58 slices shown]
[im 29/58  bone]
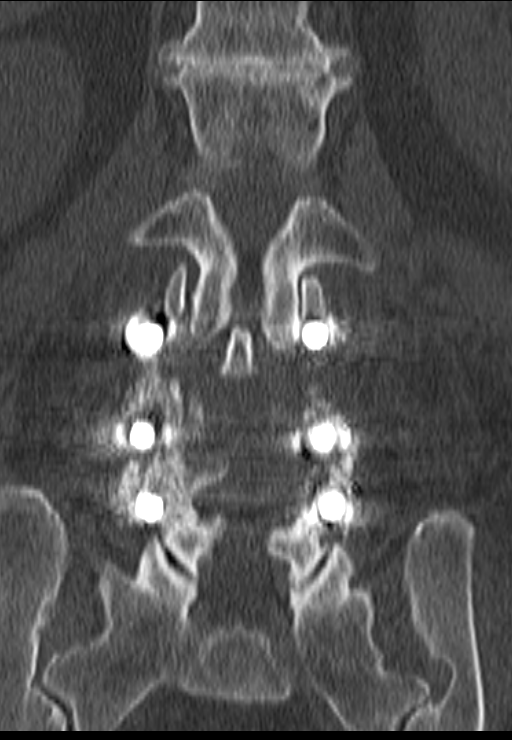

[Series 5: lumbar spine 2.0 spo sag · sagittal · 0.25mm/px · 5 of 58 slices shown, 6 images]
[im 20/58  bone]
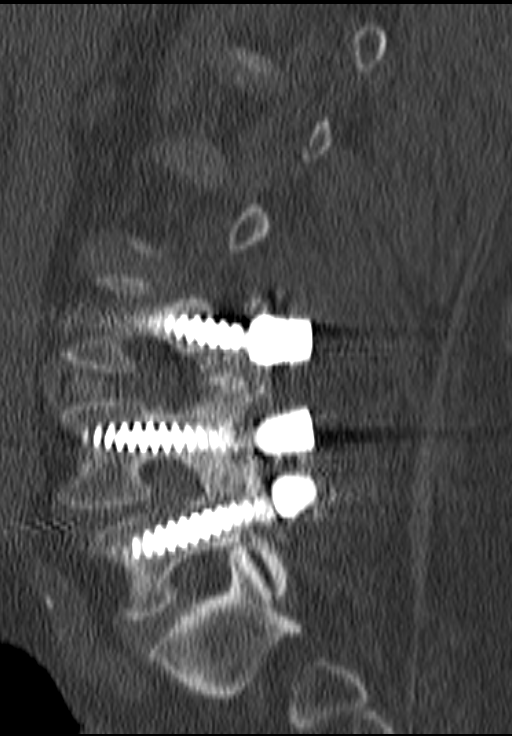
[im 24/58  bone]
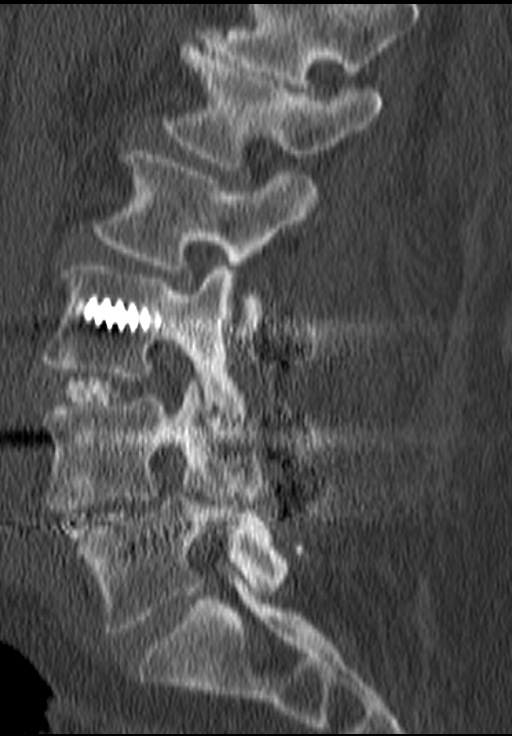
[im 29/58  soft-tissue]
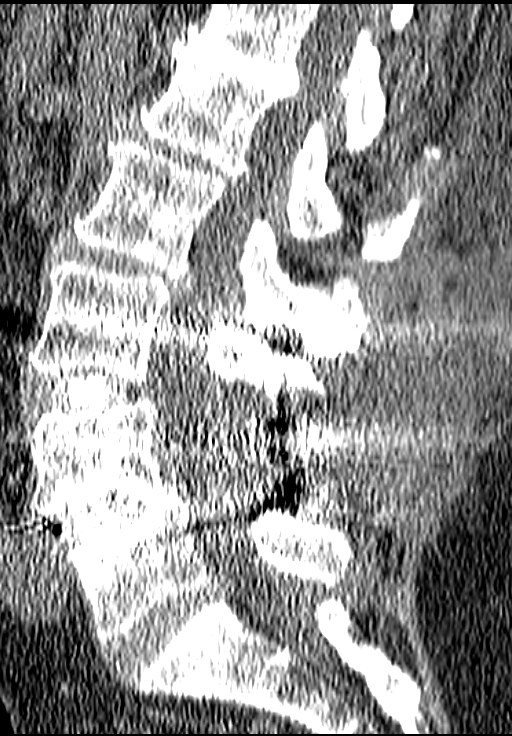
[im 29/58  bone]
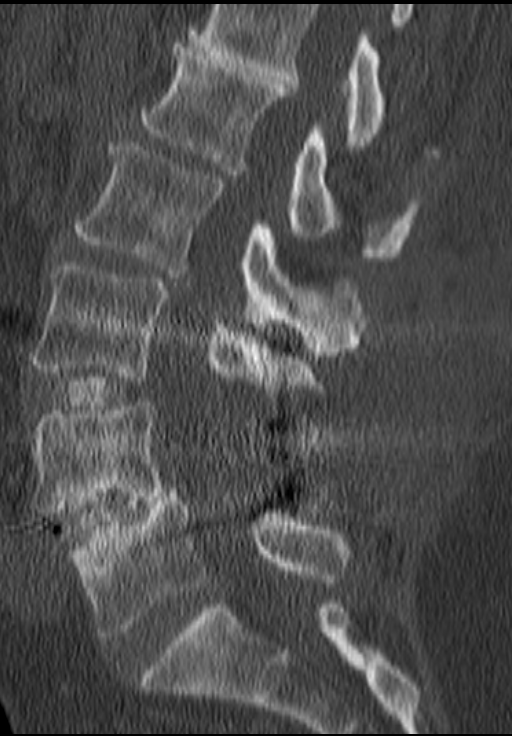
[im 34/58  bone]
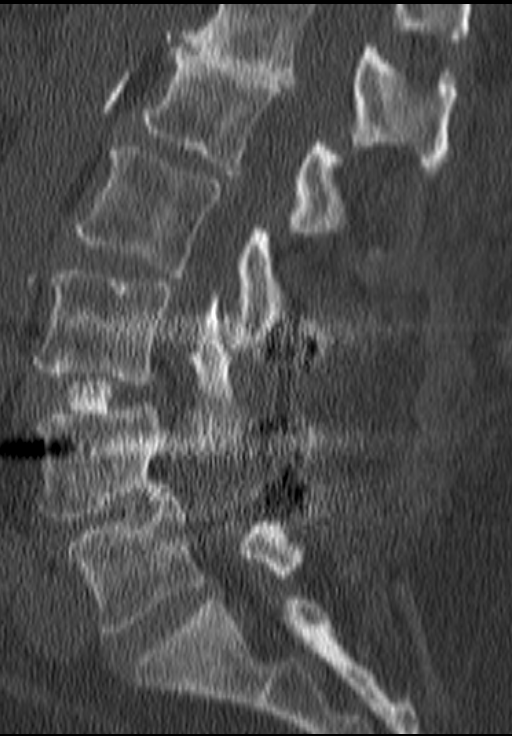
[im 39/58  bone]
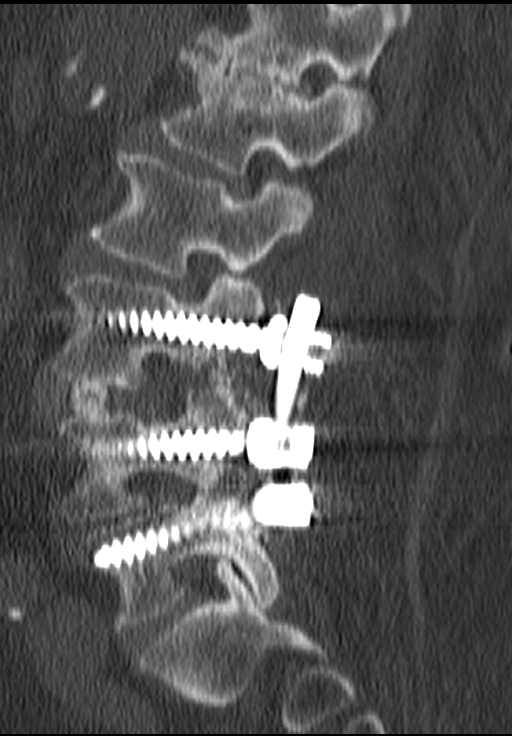

[12 of 33 positions shown; findings below may reference images not displayed]

FINDINGS: Since the prior CT, the lumbar fusion has been revised.
There is now bilateral pedicle screw and interbody fusion from L3-
L5.  There is no evidence of hardware loosening.  Solid interbody
fusion on the left at L3-L4 is noted.  The right-sided interbody
spacer shows no definite incorporation into the inferior endplate
of L3.  The L4-L5 interbody spacer appears unchanged without
definite solid interbody fusion.  The posterolateral fusion at L4-
L5 appears solid.

The overall alignment is unchanged.  There is no evidence of acute
fracture or paraspinal abnormality.

T12-L1:  There is chronic disc space loss with posterior
osteophytes.  Mild biforaminal stenosis appears stable.

L1-L2:  Stable mild disc bulging.  No spinal stenosis or nerve root
encroachment.

L2-L3:  There is progressive disc degeneration with annular disc
bulging and mild bilateral facet hypertrophy.  There is mild
central and lateral recess stenosis.  The foramina remain
sufficiently patent.

L3-L4:  Interval laminectomy with interbody fusion as noted above.
No spinal stenosis or nerve root encroachment.

L4-L5:  Stable appearance status post laminectomy and interbody
fusion.  There are residual posterior osteophytes and slight
anterolisthesis.  No significant foraminal stenosis is present.

L5-S1:  Stable minimal disc bulging and bilateral facet
hypertrophy.  No spinal stenosis or nerve root encroachment.
IMPRESSION: 1.  Interval superior extension of the lumbar fusion across the L3-
L4 disc space.  The spinal canal appears well decompressed at L3-
L4.
2.  No hardware loosening is identified.  There appears to the
incorporation of the left sided interbody spacer at L3-L4.  The
right-sided spacer has not clearly incorporated into the inferior
endplate of L3.  The postsurgical changes at L4-L5 are unchanged.
3.  Mildly progressive adjacent segment disease at L2-L3 with disc
bulging and facet disease contributing to mild multifactorial
spinal stenosis.

## 2012-01-23 ENCOUNTER — Other Ambulatory Visit: Payer: Self-pay | Admitting: *Deleted

## 2012-01-23 MED ORDER — METOPROLOL SUCCINATE ER 50 MG PO TB24: 50.0000 mg | ORAL_TABLET | Freq: Every day | ORAL | Status: DC

## 2012-02-24 ENCOUNTER — Ambulatory Visit (INDEPENDENT_AMBULATORY_CARE_PROVIDER_SITE_OTHER): Payer: PRIVATE HEALTH INSURANCE | Admitting: Cardiology

## 2012-02-24 ENCOUNTER — Ambulatory Visit: Payer: PRIVATE HEALTH INSURANCE | Admitting: Nurse Practitioner

## 2012-02-24 ENCOUNTER — Encounter: Payer: Self-pay | Admitting: Cardiology

## 2012-02-24 VITALS — BP 108/68 | HR 63 | Ht 62.0 in | Wt 256.0 lb

## 2012-02-24 DIAGNOSIS — I509 Heart failure, unspecified: Secondary | ICD-10-CM

## 2012-02-24 DIAGNOSIS — I2510 Atherosclerotic heart disease of native coronary artery without angina pectoris: Secondary | ICD-10-CM

## 2012-02-24 DIAGNOSIS — J449 Chronic obstructive pulmonary disease, unspecified: Secondary | ICD-10-CM

## 2012-02-24 DIAGNOSIS — I5032 Chronic diastolic (congestive) heart failure: Secondary | ICD-10-CM

## 2012-02-24 DIAGNOSIS — R0602 Shortness of breath: Secondary | ICD-10-CM

## 2012-02-24 DIAGNOSIS — R0600 Dyspnea, unspecified: Secondary | ICD-10-CM

## 2012-02-24 DIAGNOSIS — J4489 Other specified chronic obstructive pulmonary disease: Secondary | ICD-10-CM

## 2012-02-24 MED ORDER — FUROSEMIDE 40 MG PO TABS: ORAL_TABLET | ORAL | Status: DC

## 2012-02-24 NOTE — Assessment & Plan Note (Signed)
The cause of her weight gain and increased dyspnea is not clear.  She states that she has not run out of any of her medicines.  She states that she has continued to be on a low-salt diet.

## 2012-02-24 NOTE — Progress Notes (Signed)
[name] Date of Birth:  Apr 16, 1941 [facility] [address] [place], [place]  [address] [phone]         Fax   [phone]  History of Present Illness: This pleasant 71 year old African American woman is seen as a work in the office visit.  Comes in because of increasing shortness of breath and a 18 pound weight gain over the past several weeks.  She has a past history of congestive heart failure and a past history of coronary atherosclerosis with previous coronary bypass graft surgery in June of 2010.  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  She has a history of high blood pressure hyperlipidemia diastolic heart failure seizure disorder COPD GERD and sciatica back pain.  Her last pacemaker generator change was in 2012 she has been chronically dyspneic.  Current Outpatient Prescriptions  Medication Sig Dispense Refill  . amLODipine (NORVASC) 10 MG tablet Take 1 tablet (10 mg total) by mouth daily.  30 tablet  6  . aspirin 81 MG tablet Take 1 tablet (81 mg total) by mouth daily.      Marland Kitchen atorvastatin (LIPITOR) 40 MG tablet Take 1 tablet (40 mg total) by mouth at bedtime.  30 tablet  6  . esomeprazole (NEXIUM) 40 MG capsule Take 40 mg by mouth daily before breakfast.       . estradiol (ESTRACE) 0.5 MG tablet Take 0.5 mg by mouth daily.        . fish oil-omega-3 fatty acids 1000 MG capsule Take 1 g by mouth 2 (two) times daily.        . furosemide (LASIX) 40 MG tablet 2 tablets in the morning and 1 tablet in the evening  90 tablet  6  . gabapentin (NEURONTIN) 100 MG tablet Take 100 mg by mouth 3 (three) times daily.        . hydrALAZINE (APRESOLINE) 25 MG tablet Take 1 tablet (25 mg total) by mouth every 8 (eight) hours.  90 tablet  6  . HYDROcodone-acetaminophen (VICODIN) 5-500 MG per tablet Take 1 tablet by mouth every 4 (four) hours as needed. For pain      . lisinopril (PRINIVIL,ZESTRIL) 40 MG tablet Take 40 mg by mouth  daily.        . metFORMIN (GLUCOPHAGE) 500 MG tablet Take 500 mg by mouth daily.        . metoCLOPramide (REGLAN) 10 MG tablet Take 10 mg by mouth every morning.        . metoprolol succinate (TOPROL-XL) 50 MG 24 hr tablet Take 1 tablet (50 mg total) by mouth daily.  30 tablet  6  . nitroGLYCERIN (NITROSTAT) 0.4 MG SL tablet Place 1 tablet (0.4 mg total) under the tongue every 5 (five) minutes as needed for chest pain (Up to 3 doses).  25 tablet  3  . NON FORMULARY Oxygen at night. 2 litters       . olopatadine (PATANOL) 0.1 % ophthalmic solution Place 1 drop into both eyes 2 (two) times daily.        . phenytoin (DILANTIN) 100 MG ER capsule Take 300 mg by mouth at bedtime.        . potassium chloride (KLOR-CON) 10 MEQ CR tablet Take 1 tablet (10 mEq total) by mouth 2 (two) times daily.        Allergies  Allergen Reactions  . Codeine     "good" headache  . Oxycodone Hcl     "  good" headache    Patient Active Problem List  Diagnoses  . HYPERLIPIDEMIA  . HYPERTENSION, BENIGN  . MI  . CAD (coronary artery disease)  . IRREGULAR HEART RATE  . SCHATZKI'S RING  . GASTRIC ULCER  . GASTROPARESIS  . HIATAL HERNIA  . DIVERTICULAR DISEASE  . ARTHRITIS, UPPER ARM  . KNEE PAIN  . ANSERINE BURSITIS, LEFT  . SEIZURE DISORDER  . MUSCULOSKELETAL PAIN  . SHORTNESS OF BREATH  . CHEST PAIN  . OTHER POSTOPERATIVE INFECTION NEC  . TOTAL KNEE FOLLOW-UP  . PACEMAKER, PERMANENT  . Acute bronchitis  . Complete heart block  . Chronic diastolic heart failure  . Acute on chronic diastolic congestive heart failure  . COPD (chronic obstructive pulmonary disease)  . Anemia    History  Smoking status  . Former Smoker  Smokeless tobacco  . Never Used    History  Alcohol Use No    Family History  Problem Relation Age of Onset  . Diabetes    . Heart disease Mother     MI  . Coronary artery disease      Female <55  . Cancer      Review of Systems: Constitutional: no fever chills  diaphoresis or fatigue or change in weight.  Head and neck: no hearing loss, no epistaxis, no photophobia or visual disturbance. Respiratory: No cough, shortness of breath or wheezing. Cardiovascular: No chest pain peripheral edema, palpitations. Gastrointestinal: No abdominal distention, no abdominal pain, no change in bowel habits hematochezia or melena. Genitourinary: No dysuria, no frequency, no urgency, no nocturia. Musculoskeletal:No arthralgias, no back pain, no gait disturbance or myalgias. Neurological: No dizziness, no headaches, no numbness, no seizures, no syncope, no weakness, no tremors. Hematologic: No lymphadenopathy, no easy bruising. Psychiatric: No confusion, no hallucinations, no sleep disturbance.    Physical Exam: Filed Vitals:   02/24/12 1613  BP: 108/68  Pulse: 63   the general appearance reveals a very pleasant large elderly woman in no distress.Pupils equal and reactive.   Extraocular Movements are full.  There is no scleral icterus.  The mouth and pharynx are normal.  The neck is supple.  The carotids reveal no bruits.  The jugular venous pressure is normal.  The thyroid is not enlarged.  There is no lymphadenopathy.  The chest reveals decreased breath sounds at the bases and minimal inspiratory rales bilaterally.  The heart reveals no murmur gallop rub or click.  The abdomen is obese nontender without organomegaly or masses.  Extremities show a thick ankles and 1+ pedal edema.  EKG today shows normal sinus rhythm at 63 per minute with a normal PR interval.  Atrial pacer spikes are not seen when compared to the previous EKG of 12/15/11.   Assessment / Plan: Exacerbation of diastolic congestive heart failure with symptoms of orthopnea increased exertional dyspnea peripheral edema and weight gain. At this point we will increase her furosemide from 40 mg twice a day up to a dose of 80 mg in the morning and 40 mg in the evening.  I gave her a requisition to get a  followup basal metabolic panel in 7-10 days.  She will carry this to the facility in [place] where blood is drawn.  She is to keep her regular standing appointments with Dr. all red and Dr. Juanetta Gosling and to return sooner when necessary

## 2012-02-24 NOTE — Assessment & Plan Note (Signed)
The patient has a history of COPD and is on home oxygen at night.  However she states that her oxygen machine is so noisy that keeps her awake and so she has not been able to use it recently.

## 2012-02-24 NOTE — Assessment & Plan Note (Signed)
The patient has not been experiencing any recent chest pain or angina.  She has not had to take any sublingual nitroglycerin.

## 2012-02-24 NOTE — Patient Instructions (Signed)
Increase your Lasix (furosemide) to 40 mg 2 in the morning and 1 in the pm  Keep your scheduled visits with Dr Johney Frame and Dr Juanetta Gosling

## 2012-03-23 ENCOUNTER — Observation Stay (HOSPITAL_COMMUNITY)
Admission: EM | Admit: 2012-03-23 | Discharge: 2012-03-25 | Disposition: A | Discharge status: Home / Self Care | Payer: PRIVATE HEALTH INSURANCE | Attending: Cardiology | Admitting: Cardiology

## 2012-03-23 ENCOUNTER — Encounter (HOSPITAL_COMMUNITY): Payer: Self-pay

## 2012-03-23 ENCOUNTER — Emergency Department (HOSPITAL_COMMUNITY): Payer: PRIVATE HEALTH INSURANCE

## 2012-03-23 DIAGNOSIS — J4489 Other specified chronic obstructive pulmonary disease: Secondary | ICD-10-CM | POA: Insufficient documentation

## 2012-03-23 DIAGNOSIS — I509 Heart failure, unspecified: Secondary | ICD-10-CM | POA: Insufficient documentation

## 2012-03-23 DIAGNOSIS — I129 Hypertensive chronic kidney disease with stage 1 through stage 4 chronic kidney disease, or unspecified chronic kidney disease: Secondary | ICD-10-CM | POA: Insufficient documentation

## 2012-03-23 DIAGNOSIS — D649 Anemia, unspecified: Secondary | ICD-10-CM | POA: Insufficient documentation

## 2012-03-23 DIAGNOSIS — R079 Chest pain, unspecified: Principal | ICD-10-CM | POA: Insufficient documentation

## 2012-03-23 DIAGNOSIS — I5032 Chronic diastolic (congestive) heart failure: Secondary | ICD-10-CM | POA: Insufficient documentation

## 2012-03-23 DIAGNOSIS — E119 Type 2 diabetes mellitus without complications: Secondary | ICD-10-CM | POA: Insufficient documentation

## 2012-03-23 DIAGNOSIS — E785 Hyperlipidemia, unspecified: Secondary | ICD-10-CM | POA: Insufficient documentation

## 2012-03-23 DIAGNOSIS — I2510 Atherosclerotic heart disease of native coronary artery without angina pectoris: Secondary | ICD-10-CM | POA: Insufficient documentation

## 2012-03-23 DIAGNOSIS — N189 Chronic kidney disease, unspecified: Secondary | ICD-10-CM | POA: Insufficient documentation

## 2012-03-23 DIAGNOSIS — J449 Chronic obstructive pulmonary disease, unspecified: Secondary | ICD-10-CM | POA: Insufficient documentation

## 2012-03-23 DIAGNOSIS — K219 Gastro-esophageal reflux disease without esophagitis: Secondary | ICD-10-CM | POA: Insufficient documentation

## 2012-03-23 DIAGNOSIS — Z950 Presence of cardiac pacemaker: Secondary | ICD-10-CM | POA: Insufficient documentation

## 2012-03-23 LAB — CARDIAC PANEL(CRET KIN+CKTOT+MB+TROPI)
Relative Index: INVALID (ref 0.0–2.5)
Troponin I: <0.3 ng/mL (ref <0.30)

## 2012-03-23 LAB — CBC
Hemoglobin: 9.5 g/dL — ABNORMAL LOW (ref 12.0–15.0)
MCH: 28.4 pg (ref 26.0–34.0)
MCHC: 32 g/dL (ref 30.0–36.0)
Platelets: 193 10*3/uL (ref 150–400)
RDW: 15 % (ref 11.5–15.5)

## 2012-03-23 LAB — COMPREHENSIVE METABOLIC PANEL
ALT: 13 U/L (ref 0–35)
AST: 18 U/L (ref 0–37)
CO2: 28 mEq/L (ref 19–32)
Calcium: 9.2 mg/dL (ref 8.4–10.5)
Chloride: 100 mEq/L (ref 96–112)
Creatinine, Ser: 1.18 mg/dL — ABNORMAL HIGH (ref 0.50–1.10)
GFR calc Af Amer: 52 mL/min — ABNORMAL LOW (ref >90)
GFR calc non Af Amer: 45 mL/min — ABNORMAL LOW (ref >90)
Glucose, Bld: 100 mg/dL — ABNORMAL HIGH (ref 70–99)
Sodium: 139 mEq/L (ref 135–145)
Total Bilirubin: 0.2 mg/dL — ABNORMAL LOW (ref 0.3–1.2)

## 2012-03-23 LAB — PROTIME-INR: Prothrombin Time: 13.9 seconds (ref 11.6–15.2)

## 2012-03-23 LAB — RETICULOCYTES
RBC.: 3.36 MIL/uL — ABNORMAL LOW (ref 3.87–5.11)
Retic Ct Pct: 1.8 % (ref 0.4–3.1)

## 2012-03-23 LAB — GLUCOSE, CAPILLARY

## 2012-03-23 LAB — MAGNESIUM: Magnesium: 1.6 mg/dL (ref 1.5–2.5)

## 2012-03-23 LAB — TROPONIN I: Troponin I: <0.3 ng/mL (ref <0.30)

## 2012-03-23 MED ORDER — SODIUM CHLORIDE 0.9 % IJ SOLN
3.0000 mL | Freq: Two times a day (BID) | INTRAMUSCULAR | Status: DC
  Administered 2012-03-23 – 2012-03-25 (×4): 3 mL via INTRAVENOUS

## 2012-03-23 MED ORDER — HYDRALAZINE HCL 25 MG PO TABS
25.0000 mg | ORAL_TABLET | Freq: Three times a day (TID) | ORAL | Status: DC
  Administered 2012-03-23 – 2012-03-25 (×5): 25 mg via ORAL
  Filled 2012-03-23 (×9): qty 1

## 2012-03-23 MED ORDER — ESTRADIOL 1 MG PO TABS
0.5000 mg | ORAL_TABLET | Freq: Every day | ORAL | Status: DC
  Administered 2012-03-24 – 2012-03-25 (×2): 0.5 mg via ORAL
  Filled 2012-03-23 (×2): qty 0.5

## 2012-03-23 MED ORDER — SENNOSIDES-DOCUSATE SODIUM 8.6-50 MG PO TABS
1.0000 | ORAL_TABLET | Freq: Every day | ORAL | Status: DC | PRN
  Filled 2012-03-23: qty 1

## 2012-03-23 MED ORDER — INSULIN ASPART 100 UNIT/ML ~~LOC~~ SOLN: 0.0000 [IU] | Freq: Three times a day (TID) | SUBCUTANEOUS | Status: DC

## 2012-03-23 MED ORDER — ZOLPIDEM TARTRATE 5 MG PO TABS: 5.0000 mg | ORAL_TABLET | Freq: Every evening | ORAL | Status: DC | PRN

## 2012-03-23 MED ORDER — ACETAMINOPHEN 325 MG PO TABS
650.0000 mg | ORAL_TABLET | ORAL | Status: DC | PRN
  Administered 2012-03-24: 650 mg via ORAL
  Filled 2012-03-23: qty 2

## 2012-03-23 MED ORDER — OMEGA-3 FATTY ACIDS 1000 MG PO CAPS: 1.0000 g | ORAL_CAPSULE | Freq: Two times a day (BID) | ORAL | Status: DC

## 2012-03-23 MED ORDER — METOCLOPRAMIDE HCL 10 MG PO TABS
10.0000 mg | ORAL_TABLET | Freq: Three times a day (TID) | ORAL | Status: DC
  Administered 2012-03-23 – 2012-03-25 (×7): 10 mg via ORAL
  Filled 2012-03-23 (×11): qty 1

## 2012-03-23 MED ORDER — OMEGA-3-ACID ETHYL ESTERS 1 G PO CAPS
1.0000 g | ORAL_CAPSULE | Freq: Two times a day (BID) | ORAL | Status: DC
  Administered 2012-03-23 – 2012-03-25 (×4): 1 g via ORAL
  Filled 2012-03-23 (×6): qty 1

## 2012-03-23 MED ORDER — SODIUM CHLORIDE 0.9 % IJ SOLN: 3.0000 mL | INTRAMUSCULAR | Status: DC | PRN

## 2012-03-23 MED ORDER — ESTRADIOL 1 MG PO TABS: 0.5000 mg | ORAL_TABLET | Freq: Every day | ORAL | Status: DC

## 2012-03-23 MED ORDER — BENAZEPRIL HCL 20 MG PO TABS
20.0000 mg | ORAL_TABLET | Freq: Two times a day (BID) | ORAL | Status: DC
  Administered 2012-03-23 – 2012-03-25 (×4): 20 mg via ORAL
  Filled 2012-03-23 (×6): qty 1

## 2012-03-23 MED ORDER — KETOROLAC TROMETHAMINE 0.5 % OP SOLN
1.0000 [drp] | Freq: Three times a day (TID) | OPHTHALMIC | Status: DC
  Administered 2012-03-23 – 2012-03-25 (×5): 1 [drp] via OPHTHALMIC
  Filled 2012-03-23 (×2): qty 3

## 2012-03-23 MED ORDER — METOPROLOL TARTRATE 12.5 MG HALF TABLET
12.5000 mg | ORAL_TABLET | Freq: Two times a day (BID) | ORAL | Status: DC
  Administered 2012-03-23 – 2012-03-25 (×4): 12.5 mg via ORAL
  Filled 2012-03-23 (×6): qty 1

## 2012-03-23 MED ORDER — BROMFENAC SODIUM (ONCE-DAILY) 0.09 % OP SOLN: 1.0000 [drp] | Freq: Three times a day (TID) | OPHTHALMIC | Status: DC

## 2012-03-23 MED ORDER — FUROSEMIDE 80 MG PO TABS
80.0000 mg | ORAL_TABLET | Freq: Every morning | ORAL | Status: DC
  Administered 2012-03-24 – 2012-03-25 (×2): 80 mg via ORAL
  Filled 2012-03-23 (×2): qty 1

## 2012-03-23 MED ORDER — AMLODIPINE BESYLATE 10 MG PO TABS
10.0000 mg | ORAL_TABLET | Freq: Every day | ORAL | Status: DC
  Administered 2012-03-24 – 2012-03-25 (×2): 10 mg via ORAL
  Filled 2012-03-23 (×2): qty 1

## 2012-03-23 MED ORDER — HYDROMORPHONE HCL PF 1 MG/ML IJ SOLN
1.0000 mg | Freq: Once | INTRAMUSCULAR | Status: AC
  Administered 2012-03-23: 1 mg via INTRAMUSCULAR
  Filled 2012-03-23: qty 1

## 2012-03-23 MED ORDER — POTASSIUM CHLORIDE CRYS ER 10 MEQ PO TBCR
10.0000 meq | EXTENDED_RELEASE_TABLET | Freq: Every day | ORAL | Status: DC
  Administered 2012-03-24 – 2012-03-25 (×2): 10 meq via ORAL
  Filled 2012-03-23 (×2): qty 1

## 2012-03-23 MED ORDER — DOXEPIN HCL 25 MG PO CAPS
25.0000 mg | ORAL_CAPSULE | Freq: Every day | ORAL | Status: DC
  Administered 2012-03-23 – 2012-03-24 (×2): 25 mg via ORAL
  Filled 2012-03-23 (×4): qty 1

## 2012-03-23 MED ORDER — ATORVASTATIN CALCIUM 40 MG PO TABS
40.0000 mg | ORAL_TABLET | Freq: Every day | ORAL | Status: DC
  Administered 2012-03-24: 40 mg via ORAL
  Filled 2012-03-23 (×2): qty 1

## 2012-03-23 MED ORDER — GABAPENTIN 300 MG PO CAPS
300.0000 mg | ORAL_CAPSULE | Freq: Three times a day (TID) | ORAL | Status: DC
  Administered 2012-03-23 – 2012-03-25 (×5): 300 mg via ORAL
  Filled 2012-03-23 (×8): qty 1

## 2012-03-23 MED ORDER — SODIUM CHLORIDE 0.9 % IV SOLN: 250.0000 mL | INTRAVENOUS | Status: DC | PRN

## 2012-03-23 MED ORDER — ONDANSETRON HCL 4 MG/2ML IJ SOLN: 4.0000 mg | Freq: Four times a day (QID) | INTRAMUSCULAR | Status: DC | PRN

## 2012-03-23 MED ORDER — PHENYTOIN SODIUM EXTENDED 100 MG PO CAPS
300.0000 mg | ORAL_CAPSULE | Freq: Every day | ORAL | Status: DC
  Administered 2012-03-23 – 2012-03-24 (×2): 300 mg via ORAL
  Filled 2012-03-23 (×4): qty 3

## 2012-03-23 MED ORDER — ASPIRIN EC 81 MG PO TBEC
81.0000 mg | DELAYED_RELEASE_TABLET | Freq: Every day | ORAL | Status: DC
  Administered 2012-03-24 – 2012-03-25 (×2): 81 mg via ORAL
  Filled 2012-03-23 (×2): qty 1

## 2012-03-23 MED ORDER — NITROGLYCERIN 0.4 MG SL SUBL: 0.4000 mg | SUBLINGUAL_TABLET | SUBLINGUAL | Status: DC | PRN

## 2012-03-23 MED ORDER — SODIUM CHLORIDE 0.9 % IV SOLN: Freq: Once | INTRAVENOUS | Status: DC

## 2012-03-23 MED ORDER — KETOPROFEN 50 MG PO CAPS
50.0000 mg | ORAL_CAPSULE | Freq: Three times a day (TID) | ORAL | Status: DC
  Administered 2012-03-24 – 2012-03-25 (×5): 50 mg via ORAL
  Filled 2012-03-23 (×7): qty 1

## 2012-03-23 MED ORDER — FUROSEMIDE 40 MG PO TABS
40.0000 mg | ORAL_TABLET | Freq: Every evening | ORAL | Status: DC
Start: 2012-03-23 — End: 2012-03-25
  Administered 2012-03-23 – 2012-03-24 (×2): 40 mg via ORAL
  Filled 2012-03-23 (×5): qty 1

## 2012-03-23 NOTE — ED Notes (Signed)
3711-01 Ready 

## 2012-03-23 NOTE — H&P (Signed)
History and Physical  Patient ID: TREYANA STURGELL MRN: [id number], SOB: 29-Jul-1941 71 y.o. Date of Encounter: 03/23/2012, 4:43 PM  Primary Physician: Fredirick Maudlin, MD Primary Cardiologist: Dr. Magnus Sinning  Chief Complaint: Chest pain  HPI: 71 y.o. female w/ PMHx significant for Chronic Diastolic CHF, CAD s/p CABG '10, heart block s/p medtronic PPM, HTN, HLD, DMII and COPD who presented to [facility] on 03/23/2012 with complaints of chest pain.  Last cath Jan '12 showed patent grafts, EF 60%, and moderately increased LV diastolic pressure w/ associated increased pulmonary pressure. Was hospitalized in Dec '12 and ruled out for MI w/ myoview showing no scar or ischemia, EF 62%. She was noted to have anemia during that admission with recommendations to f/u w/ PCP for further work up. She was also noted to have a chronically elevated DDimer for which lower ext dopplers were performed and negative for DVT. She was last seen in clinic by Dr. Patty Sermons on 02/24/12 with c/o increasing sob and 18lb weight gain. Her lasix was increased to 80mg  in the morning and 40mg  in the evening w/ plans for f/u BMET. Her weight at that office visit was documented as 256lbs.   Today she reports having severe sharp substernal chest pain with left sided radiation this morning around 8am while sitting in a chair. It was associated with sob, nausea, and dizziness. No diaphoresis or palpitations. She took 3 SL NTG without relief and called EMS. She reports that she is taking all her medications as prescribed. She has had progressively worsened sob since her last clinic visit. She weighs herself every Monday and reports weighing 235lbs this week, which is what her recorded weight for today is as well. She is not very active but reports having occasionally exertional chest pain that limits her activity. Denies orthopnea and states lying down usually helps her cp and sob. She has had a nonproductive cough. Denies  fever, chills, syncope, melena, hematochezia, nausea or vomiting.   In the ED, EKG revealed NSR 68bpm with no acute ST/T changes compared to prior EKG. CXR was without acute cardiopulmonary abnormalities. Labs significant for troponinI < 0.30, pBNP 215, BUN/Crt 39/1.18, K+ 4.2, H&H 9.5/29.7, WBC 9.8. She is currently having 6/10 chest pain, although appears to be resting very comfortably.   Past Medical History  Diagnosis Date  . Diabetes mellitus   . HTN (hypertension)   . Seizure disorder   . CAD (coronary artery disease)     multivessel, CABG 6/10.  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  Myoview (11/20/11) showed no ischemia & EF 62%  . GERD (gastroesophageal reflux disease)   . Hyperlipidemia   . DVT (deep venous thrombosis)     R arm venous occlusion s/p prior PPM  . Obesity   . DDD (degenerative disc disease)   . Complete heart block 1993    s/p PPM, most recent generator change 2003  . Dehiscence of closure of sternum or sternotomy 8/10    required flap  . COPD (chronic obstructive pulmonary disease)     severe lung disease by PFTs 6/12  . Asthma   . Sleep apnea   . Chronic kidney disease   . Wound infection     Sternal  . H/O: hysterectomy     11/18/11 - Myoview IMPRESSION: 1. No scintigraphic evidence of prior infarction or pharmacologically induced ischemia.  2. Normal wall motion. Ejection fraction - 62%, unchanged from prior examination performed 01/2010.  12/09/10 - R/L Cardiac Cath Right atrial pressure,  mean of 13.  Right ventricular pressure is 43/20.  Pulmonary artery pressures is 46/14 with  A mean of 31.  Pulmonary wedge pressure, mean of 21.  Left ventricular pressure is 165/23.  Aortic pressure is 159/86 with a mean of 116.     Oxygen saturation with pulmonary artery 65%, aorta 96%.     Cardiac outputs 4.8 liters per minute.  Cardiac index is 2.6 liters per minute per meter squared.    Left ventriculography shows normal LV function.  Ejection fraction is estimated at 60%.      CORONARY ANGIOGRAPHY:  The left main coronary artery is patent.  It divides into the LAD and left circumflex.     LAD:  The LAD is diffusely disease.  There is heavy calcification with diffuse 80% mid LAD stenosis.  The first and second diagonal branches are both small and severely diseased.  The distal LAD fills competitively from the left internal mammary flow.     Left circumflex:  The left circumflex is a large vessel proximally.  The circumflex is totally occluded in the midvessel.     Right coronary artery:  The right coronary artery is calcified.  It is totally occluded at the origin of the first RV marginal branch.     Saphenous vein graft angiography:  Saphenous vein graft to PDA is patent.  There is irregularity throughout the graft, but there are no significant stenoses.  There is a prominent valve in the distal portion of the graft before the anastomotic site with the right coronary.  The distal right coronary and PDA fill from the graft and do not exhibit high-grade obstructive disease.     Saphenous vein graft to OM branch of the circumflex is widely patent. This retrograde fills the AV groove circumflex and more distal small obtuse marginal branches.  There is no stenosis visualized throughout this graft, and the runoff is fairly good into the OM branch.     LIMA to LAD is patent.  The anastomotic site is in the mid to distal LAD.     Left ventriculography shows normal LV function.  The ejection fraction is estimated at 60%.  There is no significant mitral regurgitation.     FINAL ASSESSMENT:   1. Severe native three-vessel coronary artery disease with high-grade diffuse disease of the LAD and total occlusion of the left circumflex and right coronary arteries.   2. Status post coronary bypass surgery with patency of saphenous vein grafts to the PDA and obtuse marginal branches as well as patency of the LIMA to LAD.   3. Moderately increased left ventricular diastolic pressure with  associated increased pulmonary pressure.  Recommend ongoing risk reduction and medical therapy.   01/13/10 - Echocardiogram Study Conclusions:  - Left ventricle: The cavity size was normal. Wall thickness was increased increased in a pattern of mild to moderate LVH. Systolic function was vigorous. The estimated ejection fraction was in the range of 65% to 70%. Wall motion was normal; there were no regional wall motion abnormalities. - Mitral valve: Mildly calcified annulus. - Right ventricle: The cavity size was normal. Wall thickness was mildly increased.  Surgical History:  Past Surgical History  Procedure Date  . Pacemaker placement 1993    Status post DDD pacemaker implantation in 1993, with Medtronic Kappa generator change in 2003 for  second-degree AV block, most recent gen change by Fawn Kirk 04/14/11  . Revision total hip arthroplasty   . Replacement total knee   . Thyroid surgery   .  Wrist surgery   . Coronary artery bypass graft     - LIMA to LAD, SVG to OM, SVG to PDA  . Reconstructive repair sternal     for infection s/p CABG 8/10  . Back surgery   . Esophagogastroduodenoscopy     with [facility] dilation,  biopsy, and disruption Schatzki's ring  . Colonoscopy 04/27/2005     Home Meds: Medication Sig  amLODipine (NORVASC) 10 MG tablet Take 10 mg by mouth daily.  aspirin 81 MG tablet Take 1 tablet (81 mg total) by mouth daily.  benazepril (LOTENSIN) 20 MG tablet Take 20 mg by mouth 2 (two) times daily.  Bromfenac Sodium (BROMDAY) 0.09 % SOLN Place 1 drop into the right eye 3 (three) times daily.  doxepin (SINEQUAN) 25 MG capsule Take 25 mg by mouth at bedtime.  estradiol (ESTRACE) 0.5 MG tablet Take 0.5 mg by mouth daily.    fish oil-omega-3 fatty acids 1000 MG capsule Take 1 g by mouth 2 (two) times daily.    furosemide (LASIX) 40 MG tablet Take 40-80 mg by mouth 2 (two) times daily. Takes 2 tablets in the mornings and 1 tablet in the evening  gabapentin (NEURONTIN) 300 MG capsule  Take 300 mg by mouth 3 (three) times daily.  hydrALAZINE (APRESOLINE) 25 MG tablet Take 1 tablet (25 mg total) by mouth every 8 (eight) hours.  ketoprofen (ORUDIS) 50 MG capsule Take 50 mg by mouth 3 (three) times daily.  metFORMIN (GLUCOPHAGE) 500 MG tablet Take 500 mg by mouth 2 (two) times daily with a meal.   metoCLOPramide (REGLAN) 10 MG tablet Take 10 mg by mouth 4 (four) times daily.   nitroGLYCERIN (NITROSTAT) 0.4 MG SL tablet Place 1 tablet (0.4 mg total) under the tongue every 5 (five) minutes as needed for chest pain (Up to 3 doses).  NON FORMULARY Oxygen at night. 2 litters   OVER THE COUNTER MEDICATION Take 2 tablets by mouth 2 (two) times daily.  phenytoin (DILANTIN) 100 MG ER capsule Take 300 mg by mouth at bedtime.    potassium chloride (KLOR-CON) 10 MEQ CR tablet Take 10 mEq by mouth daily.  senna-docusate (SENOKOT-S) 8.6-50 MG per tablet Take 1 tablet by mouth daily as needed.    Allergies:  Allergies  Allergen Reactions  . Codeine     "good" headache  . Other     Leafy raw vegetables and seeds  . Oxycodone Hcl     "good" headache   Social History  . Marital Status: Married   Occupational History  . Retired   Social History Main Topics  . Smoking status: Former Games developer  . Smokeless tobacco: Never Used  . Alcohol Use: No  . Drug Use: No  . Sexually Active: Not Currently    Birth Control/ Protection: Post-menopausal   Family History  Problem Relation Age of Onset  . Diabetes    . Heart disease Mother     MI  . Coronary artery disease      Female <55  . Cancer      Review of Systems: General: negative for chills, fever, night sweats or weight changes.  Cardiovascular: As per HPI Dermatological: negative for rash Respiratory: (+) nonproductive cough; negative for  wheezing Urologic: negative for hematuria Abdominal: negative for vomiting, diarrhea, bright red blood per rectum, melena, or hematemesis Neurologic: negative for visual changes, syncope All  other systems reviewed and are otherwise negative except as noted above.  Labs:   Component Value Date  WBC 9.8 03/23/2012   HGB 9.5* 03/23/2012   HCT 29.7* 03/23/2012   MCV 88.7 03/23/2012   PLT 193 03/23/2012    Lab 03/23/12 1325  NA 139  K 4.2  CL 100  CO2 28  BUN 39*  CREATININE 1.18*  CALCIUM 9.2  PROT 7.3  BILITOT 0.2*  ALKPHOS 80  ALT 13  AST 18  GLUCOSE 100*   Basename 03/23/12 1326  CKTOTAL --  CKMB --  TROPONINI <0.30     03/23/2012 13:26  Pro B Natriuretic peptide (BNP) 215.6 (H)    Radiology/Studies:   03/23/2012  -CXR Findings: Stable cardiomegaly and mediastinal contours.  Stable sequelae of CABG.  Stable right chest dual lumen cardiac pacemaker. No pneumothorax, pulmonary edema, pleural effusion or confluent pulmonary opacity.  Partially visualized lumbar fusion hardware. No acute osseous abnormality identified.  IMPRESSION: No acute cardiopulmonary abnormality.     EKG: 03/23/12 @ 1210 - NSR 68bpm with no acute ST/T changes compared to prior EKG  Physical Exam: Blood pressure 119/64, pulse 62, temperature 98.6 F (37 C), temperature source Oral, resp. rate 19, height 5\' 2"  (1.575 m), weight 235 lb (106.595 kg), SpO2 98.00%. General:Morbidly obese elderly black female in no acute distress. Head: Normocephalic, atraumatic, sclera non-icteric, nares are without discharge Neck: Supple. Negative for carotid bruits. JVD difficult to asses, but not obviously elevated Lungs: Clear bilaterally to auscultation without wheezes, rales, or rhonchi. Breathing is unlabored. Heart: Distant heart sounds; RRR with S1 S2. No murmurs, rubs, or gallops appreciated. Abdomen: Soft, non-tender, non-distended with normoactive bowel sounds. No rebound/guarding. No obvious abdominal masses. Msk:  Strength and tone appear normal for age. Extremities: No edema. No clubbing or cyanosis. Distal pedal pulses are 2+ and equal bilaterally. Neuro: Alert and oriented X 3. Moves all  extremities spontaneously. Psych:  Responds to questions appropriately with a normal affect.    ASSESSMENT AND PLAN:   71 y.o. female w/ PMHx significant for Chronic Diastolic CHF, CAD s/p CABG '10, heart block s/p medtronic PPM, HTN, HLD, DMII and COPD who presented to [facility] on 03/23/2012 with complaints of chest pain.  1. Chest pain: She presents with atypical chest pain, negative enzymes, and nonacute EKG, but has a h/o CAD s/p CABG with multiple cardiac risk factors. Had myoview ~14mos ago that was normal. Cath last year showed patent grafts. Will admit for further rule out. Cycle enzymes. If they remain negative no further ischemic work up necessary. If positive, will consider starting heparin, although with anemia will have to discuss further, and possibly plan for cath. EKG in the am. I do not see statin on her med list today, it was on her list during her clinic visit last month, so i will restart it and check lipid panel in the am (LFTs ok today). She is not on a BB and I don't see a contraindications so I will start 12.5mg  metoprolol. She has PPM so no concern for bradycardia. Cont ASA. Consider adding imdur. Give tylenol for possible musculoskeletal chest wall pain. She has h/o chronically elevated DDimer. She had evaluation with LE doppler in Dec '12 which was neg for DVT. Do not have high suspicion for PE at this time, but could consider CTA if felt necessary.   2. Anemia: Appears to have chronic anemia with H&H 10-11. Today 9.5/29.7. No active signs of bleeding. Denies hematemesis, melena or hematochezia at home. Will check anemia panel and guaiac stools. CBC in the am.  3. Chronic diastolic CHF:  EF 62% by [facility] Dec '12. Does not appear volume overloaded on exam. CXR without edema. pBNP 215, but lower than previous admission in Dec. Will cont home oral lasix. Daily weights and I/Os. Cont ACEI, hydralazine, and potassium.  4. HTN: Well controlled. Monitor with addition of  metoprolol.  5. HLD: LDL 98 in Dec '12, was initiated on statin at that time. I do not see statin on her med list today, it was on her list during her clinic visit last month, so i will restart it and check lipid panel in the am (LFTs ok today)  6. DMII: A1C 5.8 in 2010. Recheck A1C. Hold metformin and place on SSI.  7. H/o heart block s/p Medtronic PPM: Followed by Dr. Johney Frame. Generator change in 2012.   Signed, HOPE, JESSICA PA-C 03/23/2012, 4:43 PM  Patient known to me from recent office visit. Physical findings are as noted above. She is in no acute distress at the present time. Plan is to admit and cycle enzymes and be sure nothing else going on in view of her anemia etc.

## 2012-03-23 NOTE — ED Notes (Signed)
Pt was attached to the monitor. O2 started at 2 LPM/[place]

## 2012-03-23 NOTE — ED Provider Notes (Signed)
History     CSN: [id number]  Arrival date & time 03/23/12  1155   First MD Initiated Contact with Patient 03/23/12 1214      Chief Complaint  Patient presents with  . Chest Pain     HPI The patient presents with chest pain.  She notes that she had been in her usual state of health, compliant with all medications, when she developed chest pain at rest.  The pain in approximately 7 hours prior to presentation.  Since onset the pain has improved minimally with nitroglycerin sublingually.  The pain is sternal with left chest radiation.  No attempts at excision of scar.  The pain is nonexertional, non-positional.  The patient has mild nausea, with trace vomiting.  No diarrhea.  No cough, no fevers, no chills. The patient notes that this pain is "the same" as that she experienced prior to previous heart attacks. Past Medical History  Diagnosis Date  . Diabetes mellitus   . HTN (hypertension)   . Seizure disorder   . CAD (coronary artery disease)     multivessel, CABG 6/10.  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  Myoview (11/20/11) showed no ischemia & EF 62%  . GERD (gastroesophageal reflux disease)   . Hyperlipidemia   . DVT (deep venous thrombosis)     R arm venous occlusion s/p prior PPM  . Obesity   . DDD (degenerative disc disease)   . Complete heart block 1993    s/p PPM, most recent generator change 2003  . Dehiscence of closure of sternum or sternotomy 8/10    required flap  . COPD (chronic obstructive pulmonary disease)     severe lung disease by PFTs 6/12  . Asthma   . Sleep apnea   . Chronic kidney disease   . Wound infection     Sternal  . H/O: hysterectomy     Past Surgical History  Procedure Date  . Pacemaker placement 1993    Status post DDD pacemaker implantation in 1993, with [employer] Kappa generator change in 2003 for  second-degree AV block, most recent gen change by Fawn Kirk 04/14/11  . Revision total hip arthroplasty   . Replacement total knee   . Thyroid surgery   . Wrist  surgery   . Coronary artery bypass graft     - LIMA to LAD, SVG to OM, SVG to PDA  . Reconstructive repair sternal     for infection s/p CABG 8/10  . Back surgery   . Esophagogastroduodenoscopy     with [facility] dilation,  biopsy, and disruption Schatzki's ring  . Colonoscopy 04/27/2005    Family History  Problem Relation Age of Onset  . Diabetes    . Heart disease Mother     MI  . Coronary artery disease      Female <55  . Cancer      History  Substance Use Topics  . Smoking status: Former Games developer  . Smokeless tobacco: Never Used  . Alcohol Use: No    OB History    Grav Para Term Preterm Abortions TAB SAB Ect Mult Living                  Review of Systems  Constitutional:       HPI  HENT:       HPI otherwise negative  Eyes: Negative.   Respiratory:       HPI, otherwise negative  Cardiovascular:       HPI, otherwise nmegative  Gastrointestinal: Positive for  vomiting. Negative for diarrhea.  Genitourinary:       HPI, otherwise negative  Musculoskeletal:       HPI, otherwise negative  Skin: Negative.   Neurological: Negative for syncope.    Allergies  Codeine; Other; and Oxycodone hcl  Home Medications   Current Outpatient Rx  Name Route Sig Dispense Refill  . AMLODIPINE BESYLATE 10 MG PO TABS Oral Take 10 mg by mouth daily.    . ASPIRIN 81 MG PO TABS Oral Take 1 tablet (81 mg total) by mouth daily.    Marland Kitchen BENAZEPRIL HCL 20 MG PO TABS Oral Take 20 mg by mouth 2 (two) times daily.    Marland Kitchen BROMFENAC SODIUM (ONCE-DAILY) 0.09 % OP SOLN Right Eye Place 1 drop into the right eye 3 (three) times daily.    Marland Kitchen DOXEPIN HCL 25 MG PO CAPS Oral Take 25 mg by mouth at bedtime.    Marland Kitchen ESTRADIOL 0.5 MG PO TABS Oral Take 0.5 mg by mouth daily.      . OMEGA-3 FATTY ACIDS 1000 MG PO CAPS Oral Take 1 g by mouth 2 (two) times daily.      . FUROSEMIDE 40 MG PO TABS Oral Take 40-80 mg by mouth 2 (two) times daily. Takes 2 tablets in the mornings and 1 tablet in the evening    .  GABAPENTIN 300 MG PO CAPS Oral Take 300 mg by mouth 3 (three) times daily.    Marland Kitchen HYDRALAZINE HCL 25 MG PO TABS Oral Take 1 tablet (25 mg total) by mouth every 8 (eight) hours. 90 tablet 6  . KETOPROFEN 50 MG PO CAPS Oral Take 50 mg by mouth 3 (three) times daily.    Marland Kitchen METFORMIN HCL 500 MG PO TABS Oral Take 500 mg by mouth 2 (two) times daily with a meal.     . METOCLOPRAMIDE HCL 10 MG PO TABS Oral Take 10 mg by mouth 4 (four) times daily.     Marland Kitchen NITROGLYCERIN 0.4 MG SL SUBL Sublingual Place 1 tablet (0.4 mg total) under the tongue every 5 (five) minutes as needed for chest pain (Up to 3 doses). 25 tablet 3  . NON FORMULARY  Oxygen at night. 2 litters     . OVER THE COUNTER MEDICATION Oral Take 2 tablets by mouth 2 (two) times daily.    Marland Kitchen PHENYTOIN SODIUM EXTENDED 100 MG PO CAPS Oral Take 300 mg by mouth at bedtime.      Marland Kitchen POTASSIUM CHLORIDE 10 MEQ PO TBCR Oral Take 10 mEq by mouth daily.    Bernadette Hoit SODIUM 8.6-50 MG PO TABS Oral Take 1 tablet by mouth daily as needed.      BP 119/64  Pulse 62  Temp(Src) 98.6 F (37 C) (Oral)  Resp 19  Ht 5\' 2"  (1.575 m)  Wt 235 lb (106.595 kg)  BMI 42.98 kg/m2  SpO2 98%  Physical Exam  Nursing note and vitals reviewed. Constitutional: She is oriented to person, place, and time. She appears well-developed and well-nourished. No distress.       Obese, elderly appearing female  HENT:  Head: Normocephalic and atraumatic.  Eyes: Conjunctivae and EOM are normal.  Cardiovascular: Normal rate and regular rhythm.   Murmur heard. Pulmonary/Chest: Effort normal and breath sounds normal. No stridor. No respiratory distress.    Abdominal: She exhibits no distension.  Musculoskeletal: She exhibits edema. She exhibits no tenderness.  Neurological: She is alert and oriented to person, place, and time. No cranial nerve deficit.  Skin: Skin is warm and dry.  Psychiatric: She has a normal mood and affect.    ED Course  Procedures (including  critical care time)  Labs Reviewed  COMPREHENSIVE METABOLIC PANEL - Abnormal; Notable for the following:    Glucose, Bld 100 (*)    BUN 39 (*)    Creatinine, Ser 1.18 (*)    Total Bilirubin 0.2 (*)    GFR calc non Af Amer 45 (*)    GFR calc Af Amer 52 (*)    All other components within normal limits  CBC - Abnormal; Notable for the following:    RBC 3.35 (*)    Hemoglobin 9.5 (*)    HCT 29.7 (*)    All other components within normal limits  PRO B NATRIURETIC PEPTIDE - Abnormal; Notable for the following:    Pro B Natriuretic peptide (BNP) 215.6 (*)    All other components within normal limits  TROPONIN I  PROTIME-INR   Dg Chest 2 View  03/23/2012  *RADIOLOGY REPORT*  Clinical Data: 70-year -old female with shortness of breath and weakness.  CHEST - 2 VIEW  Comparison: 11/18/2011 and earlier.  Findings: Stable cardiomegaly and mediastinal contours.  Stable sequelae of CABG.  Stable right chest dual lumen cardiac pacemaker. No pneumothorax, pulmonary edema, pleural effusion or confluent pulmonary opacity.  Partially visualized lumbar fusion hardware. No acute osseous abnormality identified.  IMPRESSION: No acute cardiopulmonary abnormality.  Original Report Authenticated By: Harley Hallmark, M.D.     No diagnosis found.  Cardiac monitor 61 sinus rhythm normal Pulse oxygen 96% room air normal I reviewed the chest x-ray   Date: 03/23/2012  Rate: 68  Rhythm: normal sinus rhythm  QRS Axis: left  Intervals: normal  ST/T Wave abnormalities: nonspecific T wave changes  Conduction Disutrbances:none  Narrative Interpretation:   Old EKG Reviewed: unchanged  ABNORMAL  MDM  This elderly appearing female with a history of CAD, MI, pacemaker, now presents with chest pain that is "the same" as that she experienced with prior myocardial infarction.  The patient's pain improved substantially with morphine, and her initial evaluation is somewhat reassuring.  However given the patient's  comorbidities, the need for additional medication for relief the patient will be admitted for further evaluation and management by the cardiology service.     Gerhard Munch, MD 03/23/12 1501

## 2012-03-23 NOTE — ED Notes (Signed)
Pt was brought in by ambulance with c/o chest pain onset this morning while sitting on the chair. Pt took 3 NTG SL with partial relief. Pt had 324 mg po of ASA. EMS Started her on Nitropaste. Pt claimed that her chest pain is a 4/10

## 2012-03-23 NOTE — ED Notes (Signed)
Cardiology at bedside.

## 2012-03-23 NOTE — ED Notes (Signed)
Attempt to call report x1. Given this RNs number to return call.

## 2012-03-23 NOTE — ED Notes (Signed)
IV team to attempt new IV

## 2012-03-24 DIAGNOSIS — R079 Chest pain, unspecified: Principal | ICD-10-CM

## 2012-03-24 LAB — GLUCOSE, CAPILLARY
Glucose-Capillary: 110 mg/dL — ABNORMAL HIGH (ref 70–99)
Glucose-Capillary: 119 mg/dL — ABNORMAL HIGH (ref 70–99)
Glucose-Capillary: 108 mg/dL — ABNORMAL HIGH (ref 70–99)
Glucose-Capillary: 113 mg/dL — ABNORMAL HIGH (ref 70–99)

## 2012-03-24 LAB — IRON AND TIBC: UIBC: 200 ug/dL (ref 125–400)

## 2012-03-24 LAB — BASIC METABOLIC PANEL
BUN: 28 mg/dL — ABNORMAL HIGH (ref 6–23)
GFR calc non Af Amer: 57 mL/min — ABNORMAL LOW (ref >90)
Glucose, Bld: 108 mg/dL — ABNORMAL HIGH (ref 70–99)
Potassium: 3.6 mEq/L (ref 3.5–5.1)

## 2012-03-24 LAB — LIPID PANEL
Cholesterol: 231 mg/dL — ABNORMAL HIGH (ref 0–200)
HDL: 54 mg/dL (ref >39)
Total CHOL/HDL Ratio: 4.3 RATIO
Triglycerides: 170 mg/dL — ABNORMAL HIGH (ref <150)

## 2012-03-24 LAB — CARDIAC PANEL(CRET KIN+CKTOT+MB+TROPI)
CK, MB: 1.6 ng/mL (ref 0.3–4.0)
Total CK: 42 U/L (ref 7–177)
Troponin I: <0.3 ng/mL (ref <0.30)

## 2012-03-24 LAB — CBC
Hemoglobin: 9.9 g/dL — ABNORMAL LOW (ref 12.0–15.0)
MCH: 28.6 pg (ref 26.0–34.0)
MCHC: 31.5 g/dL (ref 30.0–36.0)

## 2012-03-24 LAB — FOLATE: Folate: >20 ng/mL

## 2012-03-24 NOTE — Progress Notes (Signed)
Subjective:  Chest pain is less.  Denies dyspnea.  She is tender to touch on entire thorax front and back.  Also tender in abdomen and tender to light touch in legs.  States she had injections into her back about three weeks ago which helped her leg pain for awhile but now pain has recurred. LDL is high.  She does not think she has been taking any statin at home. Now on atorvistatin.  Objective:  Vital Signs in the last 24 hours: Temp:  [97.7 F (36.5 C)-98.4 F (36.9 C)] 97.7 F (36.5 C) (04/20 0500) Pulse Rate:  [59-63] 60  (04/20 0500) Resp:  [10-20] 20  (04/20 0500) BP: (119-142)/(47-69) 142/69 mmHg (04/20 0500) SpO2:  [94 %-98 %] 98 % (04/20 0500) Weight:  [114.533 kg (252 lb 8 oz)] 114.533 kg (252 lb 8 oz) (04/19 2000)  Intake/Output from previous day:   Intake/Output from this shift: Total I/O In: 240 [P.O.:240] Out: -      . amLODipine  10 mg Oral Daily  . aspirin EC  81 mg Oral Daily  . atorvastatin  40 mg Oral q1800  . benazepril  20 mg Oral BID  . doxepin  25 mg Oral QHS  . estradiol  0.5 mg Oral Daily  . furosemide  40 mg Oral QPM  . furosemide  80 mg Oral q morning - 10a  . gabapentin  300 mg Oral TID  . hydrALAZINE  25 mg Oral Q8H  .  HYDROmorphone (DILAUDID) injection  1 mg Intramuscular Once  . insulin aspart  0-15 Units Subcutaneous TID WC  . ketoprofen  50 mg Oral TID WC  . ketorolac  1 drop Right Eye TID  . metoCLOPramide  10 mg Oral TID AC & HS  . metoprolol tartrate  12.5 mg Oral BID  . omega-3 acid ethyl esters  1 g Oral BID  . phenytoin  300 mg Oral QHS  . potassium chloride  10 mEq Oral Daily  . sodium chloride  3 mL Intravenous Q12H  . DISCONTD: sodium chloride   Intravenous Once  . DISCONTD: Bromfenac Sodium  1 drop Right Eye TID  . DISCONTD: estradiol  0.5 mg Oral Daily  . DISCONTD: fish oil-omega-3 fatty acids  1 g Oral BID      Physical Exam: The patient appears to be in no distress.  Head and neck exam reveals that the pupils  are equal and reactive.  The extraocular movements are full.  There is no scleral icterus.  Mouth and pharynx are benign.  No lymphadenopathy.  No carotid bruits.  The jugular venous pressure is difficult to see.  Thyroid is not enlarged or tender.  Chest is clear to percussion and auscultation.  No rales or rhonchi.  Expansion of the chest is symmetrical.  Heart reveals no abnormal lift or heave.  First and second heart sounds are normal.  There is no murmur gallop rub or click.  The abdomen is soft and nontender.  Bowel sounds are normoactive.  There is no hepatosplenomegaly or mass.  There are no abdominal bruits.  Extremities reveal no phlebitis or edema.  Pedal pulses are good.  There is no cyanosis or clubbing.  Neurologic exam is normal strength and no lateralizing weakness.  No sensory deficits.  Integument reveals no rash  Lab Results:  Basename 03/24/12 0535 03/23/12 1325  WBC 7.7 9.8  HGB 9.9* 9.5*  PLT 206 193    Basename 03/24/12 0535 03/23/12 1325  NA  140 139  K 3.6 4.2  CL 101 100  CO2 30 28  GLUCOSE 108* 100*  BUN 28* 39*  CREATININE 0.97 1.18*    Basename 03/24/12 0535 03/24/12 0034  TROPONINI <0.30 <0.30   Hepatic Function Panel  Basename 03/23/12 1325  PROT 7.3  ALBUMIN 3.6  AST 18  ALT 13  ALKPHOS 80  BILITOT 0.2*  BILIDIR --  IBILI --    Basename 03/24/12 0535  CHOL 231*   No results found for this basename: PROTIME in the last 72 hours  Imaging: Dg Chest 2 View  03/23/2012  *RADIOLOGY REPORT*  Clinical Data: 70-year -old female with shortness of breath and weakness.  CHEST - 2 VIEW  Comparison: 11/18/2011 and earlier.  Findings: Stable cardiomegaly and mediastinal contours.  Stable sequelae of CABG.  Stable right chest dual lumen cardiac pacemaker. No pneumothorax, pulmonary edema, pleural effusion or confluent pulmonary opacity.  Partially visualized lumbar fusion hardware. No acute osseous abnormality identified.  IMPRESSION: No acute  cardiopulmonary abnormality.  Original Report Authenticated By: Harley Hallmark, M.D.    Cardiac Studies: Telemetry shows paced atrial rhythm.  Assessment/Plan:  1.  Chest pain.      Assessment:  Pain likely to be musculoskeletal.  Cardiac enzymes are normal.  EKG today shows increase in nonspecific T wave changes. 2.  Anemia ? Etiology      Anemia panel pending.  Hgb this am slightly improved. 3.  Hyperlipidemia       LDL 143.  Known CAD. Back on statin now. 4.   Diabetes       BS reasonable.  A1C pending 5.  Essential hypertension       BP controlled on present Rx 6.  PPM        Rhythm stable atrial pacing       Plan: Continue current RX,  Await anemia studies.  Possible home in am. Recheck EKG in am  LOS: 1 day    [name] 03/24/2012, 1:08 PM

## 2012-03-25 ENCOUNTER — Encounter (HOSPITAL_COMMUNITY): Payer: Self-pay | Admitting: *Deleted

## 2012-03-25 LAB — GLUCOSE, CAPILLARY: Glucose-Capillary: 118 mg/dL — ABNORMAL HIGH (ref 70–99)

## 2012-03-25 LAB — CBC
Hemoglobin: 10 g/dL — ABNORMAL LOW (ref 12.0–15.0)
MCH: 28.7 pg (ref 26.0–34.0)
RBC: 3.48 MIL/uL — ABNORMAL LOW (ref 3.87–5.11)

## 2012-03-25 MED ORDER — ATORVASTATIN CALCIUM 40 MG PO TABS: 40.0000 mg | ORAL_TABLET | Freq: Every day | ORAL | Status: DC

## 2012-03-25 MED ORDER — METOPROLOL TARTRATE 25 MG PO TABS: 12.5000 mg | ORAL_TABLET | Freq: Two times a day (BID) | ORAL | Status: DC

## 2012-03-25 NOTE — Progress Notes (Signed)
03/25/12 0200  Patient awoke with IV out.  Dr. Antoine Poche notified.  Order received to leave IV out.  Will continue to monitor.  [name]

## 2012-03-25 NOTE — Discharge Summary (Signed)
Patient ID: [name],  MRN: [id number], DOB/AGE: 73/08/1941 73 y.o.  Admit date: 03/23/2012 Discharge date: 03/25/2012  Primary Care Provider: Fredirick Maudlin, MD Primary Cardiologist: Shela Commons. Allred, MD  Discharge Diagnoses Principal Problem:  *CHEST PAIN Active Problems:  HYPERLIPIDEMIA  HYPERTENSION, BENIGN  CAD (coronary artery disease)  Complete heart block  Chronic diastolic heart failure  COPD (chronic obstructive pulmonary disease)   Allergies Allergies  Allergen Reactions  . Codeine     "good" headache  . Other     Leafy raw vegetables and seeds  . Oxycodone Hcl     "good" headache    Procedures  None  History of Present Illness  71 y/o female with h/o CAD s/p CABG in 2010 who was in her USOH until 4/19 when she developed severe sharp, substernal chest apin with radiation to the left side, associated with sob, nauea, and dizziness.  Symptoms did not immediately resolve after taking 3 SL NTG and she called EMS and was taken to the [facility] ED where ECG was non-acute and cxr was normal.  She was admitted for further eval.  Hospital Course  Pt r/o for MI.  She initially complained of diffuse body aches and tenderness.  In the absence of objective evidence of ischemia, no further ischemic evaluation was planned.  She has chronic anemia and iron studies along w/ B12 & folate were evaluated and normal.  This AM, diffuse aching and tenderness has resolved.  She has had no further chest pain.  She will be discharged home today in good condition.  Discharge Vitals Blood pressure 131/83, pulse 60, temperature 98.6 F (37 C), temperature source Oral, resp. rate 18, height 5\' 2"  (1.575 m), weight 256 lb 13.4 oz (116.5 kg), SpO2 99.00%.  Filed Weights   03/23/12 1208 03/23/12 2000 03/25/12 0500  Weight: 235 lb (106.595 kg) 252 lb 8 oz (114.533 kg) 256 lb 13.4 oz (116.5 kg)   Labs  CBC  Basename 03/25/12 0552 03/24/12 0535  WBC 10.0 7.7  NEUTROABS -- --  HGB 10.0* 9.9*    HCT 31.0* 31.4*  MCV 89.1 90.8  PLT 238 206   Basic Metabolic Panel  Basename 03/24/12 0535 03/23/12 1842 03/23/12 1325  NA 140 -- 139  K 3.6 -- 4.2  CL 101 -- 100  CO2 30 -- 28  GLUCOSE 108* -- 100*  BUN 28* -- 39*  CREATININE 0.97 -- 1.18*  CALCIUM 9.3 -- 9.2  MG -- 1.6 --  PHOS -- -- --   Liver Function Tests  Basename 03/23/12 1325  AST 18  ALT 13  ALKPHOS 80  BILITOT 0.2*  PROT 7.3  ALBUMIN 3.6   Cardiac Enzymes  Basename 03/24/12 0535 03/24/12 0034 03/23/12 1842  CKTOTAL 42 44 44  CKMB 1.5 1.6 1.6  CKMBINDEX -- -- --  TROPONINI <0.30 <0.30 <0.30   Hemoglobin A1C  Basename 03/23/12 1842  HGBA1C 6.1*   Fasting Lipid Panel  Basename 03/24/12 0535  CHOL 231*  HDL 54  LDLCALC 143*  TRIG 170*  CHOLHDL 4.3  LDLDIRECT --   Thyroid Function Tests  Basename 03/23/12 1842  TSH 1.569  T4TOTAL --  T3FREE --  THYROIDAB --   Disposition  Pt is being discharged home today in good condition.  Follow-up Plans & Appointments  Follow-up Information    Follow up with Hillis Range, MD on 04/18/2012. (9:45 AM)    Contact information:   [address], [address] [place] [address] [phone]  Follow up with HAWKINS,EDWARD L, MD. (As scheduled)    Contact information:   [address] [place] [address] [phone]         Discharge Medications  Medication List  As of 03/25/2012 12:01 PM   TAKE these medications         amLODipine 10 MG tablet   Commonly known as: NORVASC   Take 10 mg by mouth daily.      aspirin 81 MG tablet   Take 1 tablet (81 mg total) by mouth daily.      atorvastatin 40 MG tablet   Commonly known as: LIPITOR   Take 1 tablet (40 mg total) by mouth daily at 6 PM.      benazepril 20 MG tablet   Commonly known as: LOTENSIN   Take 20 mg by mouth 2 (two) times daily.      BROMDAY 0.09 % Soln   Generic drug: Bromfenac Sodium   Place 1 drop into the right eye 3  (three) times daily.      doxepin 25 MG capsule   Commonly known as: SINEQUAN   Take 25 mg by mouth at bedtime.      estradiol 0.5 MG tablet   Commonly known as: ESTRACE   Take 0.5 mg by mouth daily.      fish oil-omega-3 fatty acids 1000 MG capsule   Take 1 g by mouth 2 (two) times daily.      furosemide 40 MG tablet   Commonly known as: LASIX   Take 40-80 mg by mouth 2 (two) times daily. Takes 2 tablets in the mornings and 1 tablet in the evening      gabapentin 300 MG capsule   Commonly known as: NEURONTIN   Take 300 mg by mouth 3 (three) times daily.      hydrALAZINE 25 MG tablet   Commonly known as: APRESOLINE   Take 1 tablet (25 mg total) by mouth every 8 (eight) hours.      ketoprofen 50 MG capsule   Commonly known as: ORUDIS   Take 50 mg by mouth 3 (three) times daily.      metFORMIN 500 MG tablet   Commonly known as: GLUCOPHAGE   Take 500 mg by mouth 2 (two) times daily with a meal.      metoCLOPramide 10 MG tablet   Commonly known as: REGLAN   Take 10 mg by mouth 4 (four) times daily.      metoprolol tartrate 25 MG tablet   Commonly known as: LOPRESSOR   Take 0.5 tablets (12.5 mg total) by mouth 2 (two) times daily.      nitroGLYCERIN 0.4 MG SL tablet   Commonly known as: NITROSTAT   Place 1 tablet (0.4 mg total) under the tongue every 5 (five) minutes as needed for chest pain (Up to 3 doses).      NON FORMULARY   Oxygen at night. 2 litters      OVER THE COUNTER MEDICATION   Take 2 tablets by mouth 2 (two) times daily.      phenytoin 100 MG ER capsule   Commonly known as: DILANTIN   Take 300 mg by mouth at bedtime.      potassium chloride 10 MEQ CR tablet   Commonly known as: KLOR-CON   Take 10 mEq by mouth daily.      senna-docusate 8.6-50 MG per tablet   Commonly known as: Senokot-S   Take 1 tablet by  mouth daily as needed.            Outstanding Labs/Studies  None  Duration of Discharge Encounter   Greater than 30 minutes  including physician time.  SignedNicolasa Ducking NP 03/25/2012, 12:01 PM

## 2012-03-25 NOTE — Discharge Instructions (Signed)
***  PLEASE REMEMBER TO BRING ALL OF YOUR MEDICATIONS TO EACH OF YOUR FOLLOW-UP OFFICE VISITS.  

## 2012-03-25 NOTE — Progress Notes (Signed)
Patient Name: [name] Date of Encounter: 03/25/2012   Principal Problem:  *CHEST PAIN Active Problems:  HYPERLIPIDEMIA  HYPERTENSION, BENIGN  CAD (coronary artery disease)  Complete heart block - s/p PPM  Chronic diastolic heart failure  COPD (chronic obstructive pulmonary disease)    SUBJECTIVE  No chest pain.  Breathing stable.  C/o left knee pain (h/o bilat tka).  CURRENT MEDS    . amLODipine  10 mg Oral Daily  . aspirin EC  81 mg Oral Daily  . atorvastatin  40 mg Oral q1800  . benazepril  20 mg Oral BID  . doxepin  25 mg Oral QHS  . estradiol  0.5 mg Oral Daily  . furosemide  40 mg Oral QPM  . furosemide  80 mg Oral q morning - 10a  . gabapentin  300 mg Oral TID  . hydrALAZINE  25 mg Oral Q8H  . insulin aspart  0-15 Units Subcutaneous TID WC  . ketoprofen  50 mg Oral TID WC  . ketorolac  1 drop Right Eye TID  . metoCLOPramide  10 mg Oral TID AC & HS  . metoprolol tartrate  12.5 mg Oral BID  . omega-3 acid ethyl esters  1 g Oral BID  . phenytoin  300 mg Oral QHS  . potassium chloride  10 mEq Oral Daily  . sodium chloride  3 mL Intravenous Q12H    OBJECTIVE  Filed Vitals:   03/24/12 0500 03/24/12 1300 03/24/12 2100 03/25/12 0500  BP: 142/69 112/62 135/79 131/83  Pulse: 60 63 67 60  Temp: 97.7 F (36.5 C) 98.1 F (36.7 C) 98.8 F (37.1 C) 98.6 F (37 C)  TempSrc: Oral  Oral Oral  Resp: 20 18 18 18   Height:      Weight:    256 lb 13.4 oz (116.5 kg)  SpO2: 98% 98% 98% 99%    Intake/Output Summary (Last 24 hours) at 03/25/12 0845 Last data filed at 03/24/12 0900  Gross per 24 hour  Intake    240 ml  Output      0 ml  Net    240 ml   PHYSICAL EXAM  General: Pleasant, NAD. Neuro: Alert and oriented X 3. Moves all extremities spontaneously. Psych: Normal affect. HEENT:  Normal  Neck: Supple without bruits or JVD. Lungs:  Resp regular and unlabored, diminished bilat. Heart: RRR no s3, s4.  Soft syst murmur llsb. Abdomen: Soft,  non-tender, non-distended, BS + x 4.  Extremities: No clubbing, cyanosis or edema. DP/PT/Radials 1+ and equal bilaterally.  Right knee is nontender w/o heat or erythema.  Accessory Clinical Findings  CBC  Basename 03/25/12 0552 03/24/12 0535  WBC 10.0 7.7  NEUTROABS -- --  HGB 10.0* 9.9*  HCT 31.0* 31.4*  MCV 89.1 90.8  PLT 238 206   Basic Metabolic Panel  Basename 03/24/12 0535 03/23/12 1842 03/23/12 1325  NA 140 -- 139  K 3.6 -- 4.2  CL 101 -- 100  CO2 30 -- 28  GLUCOSE 108* -- 100*  BUN 28* -- 39*  CREATININE 0.97 -- 1.18*  CALCIUM 9.3 -- 9.2  MG -- 1.6 --  PHOS -- -- --   Liver Function Tests  Basename 03/23/12 1325  AST 18  ALT 13  ALKPHOS 80  BILITOT 0.2*  PROT 7.3  ALBUMIN 3.6   Cardiac Enzymes  Basename 03/24/12 0535 03/24/12 0034 03/23/12 1842  CKTOTAL 42 44 44  CKMB 1.5 1.6 1.6  CKMBINDEX -- -- --  TROPONINI <0.30 <0.30 <0.30   Hemoglobin A1C  Basename 03/23/12 1842  HGBA1C 6.1*   Fasting Lipid Panel  Basename 03/24/12 0535  CHOL 231*  HDL 54  LDLCALC 143*  TRIG 170*  CHOLHDL 4.3  LDLDIRECT --   Thyroid Function Tests  Basename 03/23/12 1842  TSH 1.569  T4TOTAL --  T3FREE --  THYROIDAB --   Lab Results  Component Value Date   IRON 113 03/23/2012   TIBC 313 03/23/2012   FERRITIN 44 03/23/2012   Lab Results  Component Value Date   FOLATE >20.0 03/23/2012   Lab Results  Component Value Date   VITAMINB12 414 03/23/2012    TELE  A paced.  ECG  Sinus, anterolat twi, lad - not acutely changed.  ASSESSMENT AND PLAN  1.  Chest Pain/CAD:  CE negative.  ECG non-acute.  No further ischemic eval planned @ this time.  2.  Normocytic/normochromic Anemia:  Normal iron studies, b12, folate.  Stable h/h.  3.  HL:  LDL 143.  On statin.  4.  HTN:  Stable.  5.  DM:  A1C 6.1.  6.  OA:  C/o right knee pain. She takes ketoprofen tid.  Will cont.  7.  Dispo:  prob d/c today.  Signed, Nicolasa Ducking NP Her chest pain has  resolved nicely. Her generalized body tenderness has also resolved. Rhythm stable. Exam reveals clear lungs with poor inspiratory effort and decreased breath sounds at bases. Heart reveals no gallop. Extremities: Calf tenderness and hypersensitivity to light touch has resolved.  Plan: Okay for discharge today.    Cardiology followup with Dr. Johney Frame.  She states she already has an appointment for the end of the month.    Medical followup with Dr. Juanetta Gosling

## 2012-03-26 ENCOUNTER — Telehealth: Payer: Self-pay | Admitting: Internal Medicine

## 2012-03-26 NOTE — Telephone Encounter (Signed)
N/A.  LMTC. 

## 2012-03-26 NOTE — Telephone Encounter (Signed)
Pt was notified that an appt was scheduled for her with Dr Johney Frame on 04/18/12 at 9:45 and that she should keep that appt.

## 2012-03-26 NOTE — Telephone Encounter (Signed)
Pt was having chest pains and went to er came home yesterday, said she was to be seen 4-30, dr allred not here, pt said all test were normal, is this something that needs an appt so soon?

## 2012-03-29 IMAGING — CR DG SHOULDER 2+V*R*
3 series · 3 of 3 positions shown · non-contrast
Comparison: 08/10/2004

CLINICAL DATA: Musculoskeletal pain, shoulder pain for 4 days

RIGHT SHOULDER - 2+ VIEW

[view not recorded (1 of 3)]
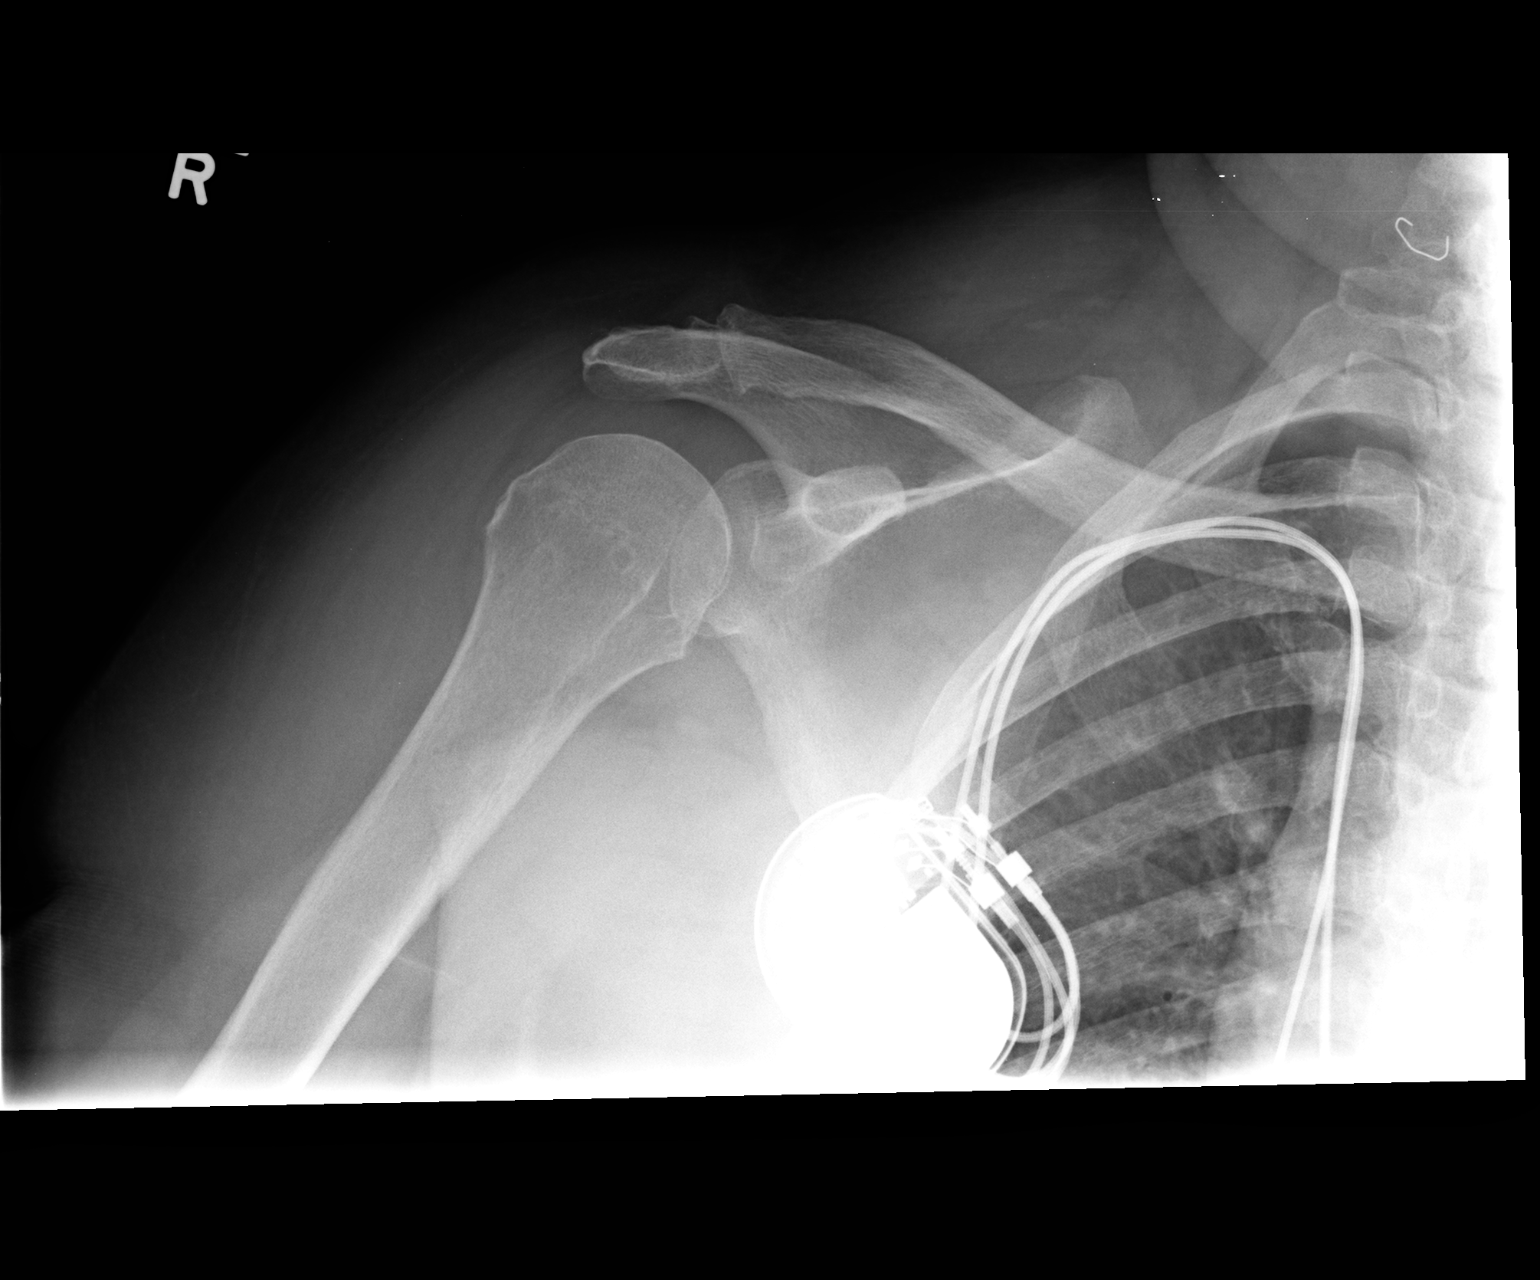

[view not recorded (2 of 3)]
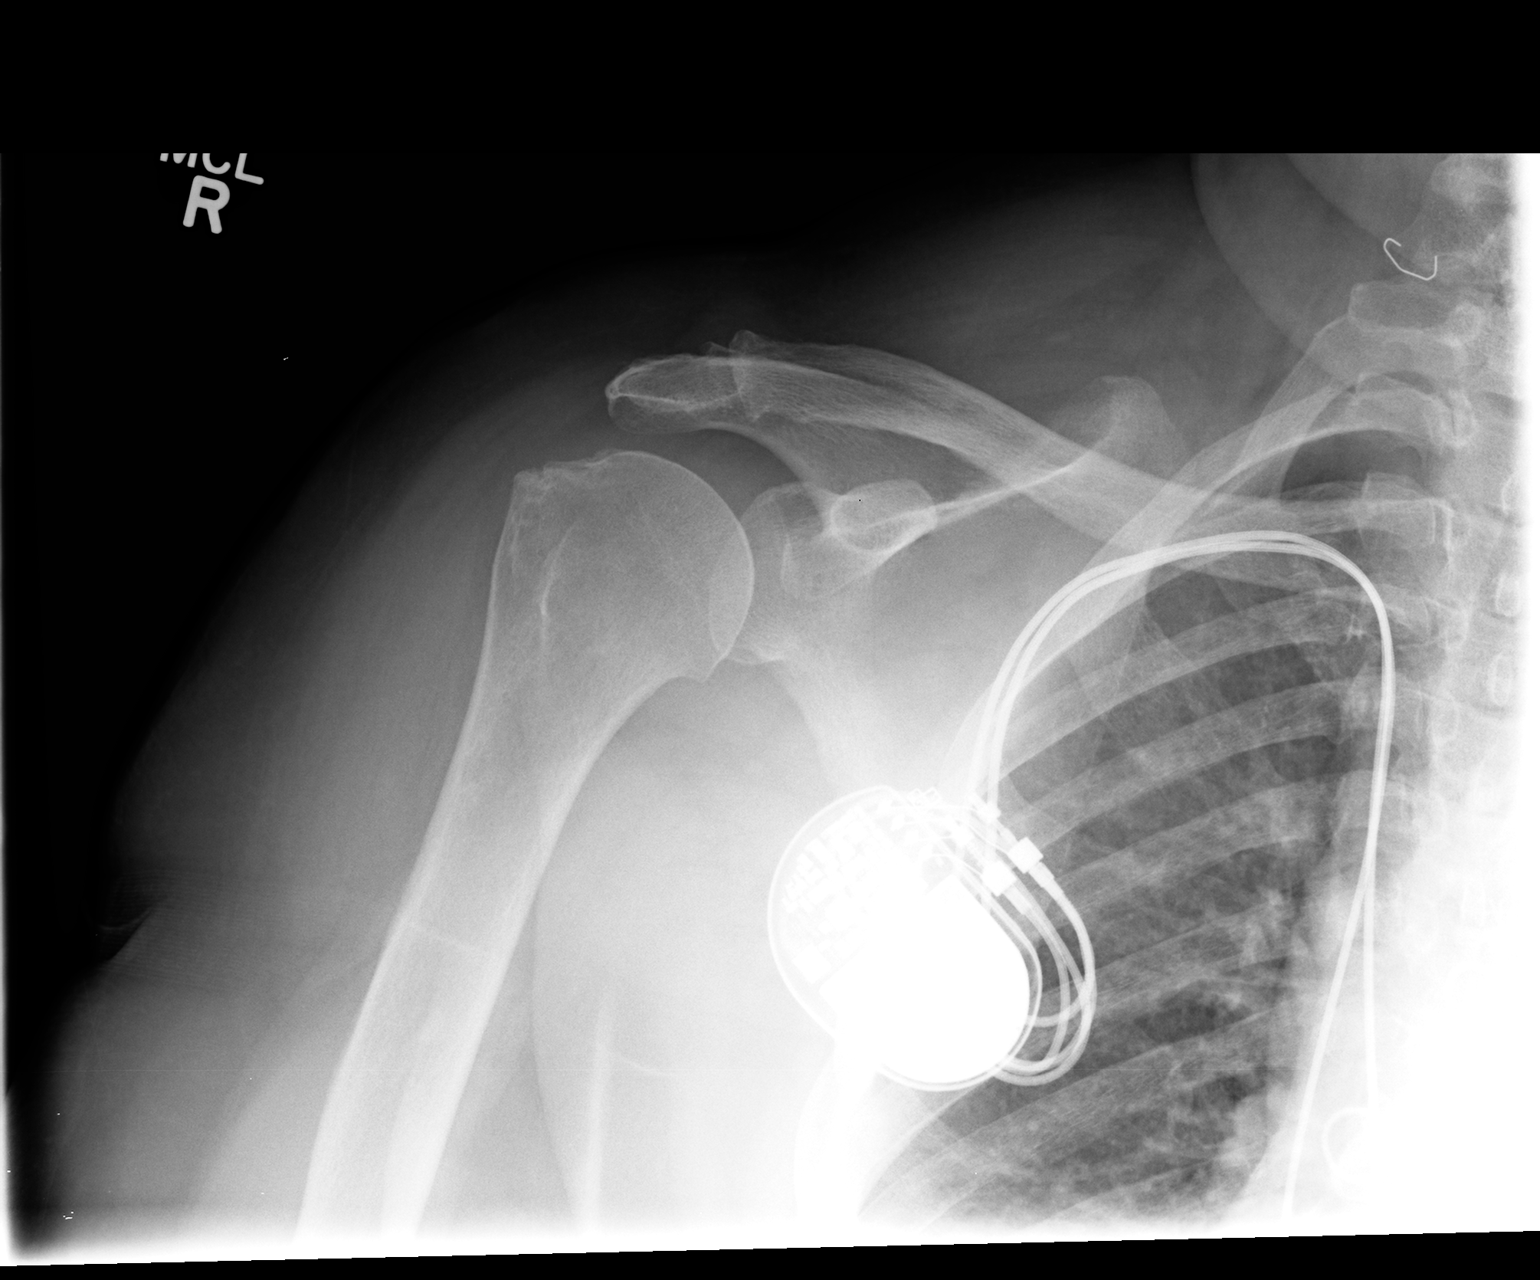

[view not recorded (3 of 3)]
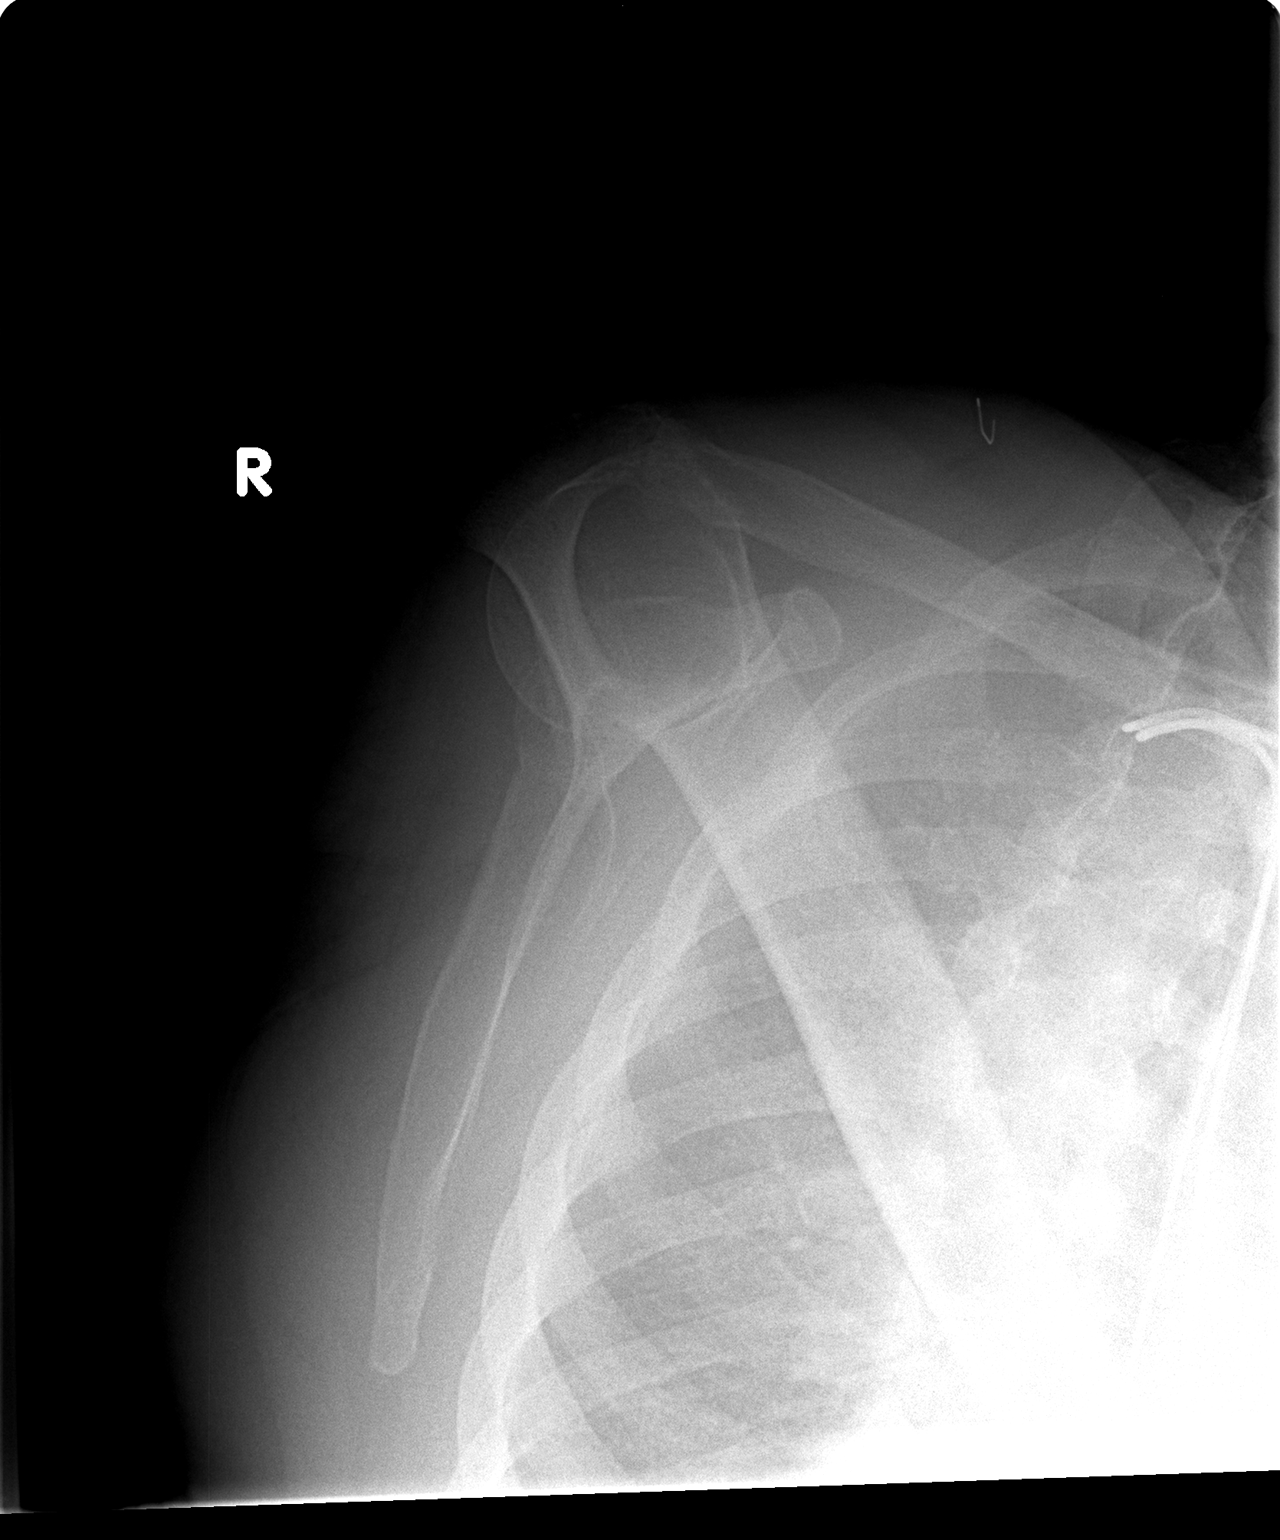

[3 of 3 positions shown; findings below may reference images not displayed]

FINDINGS: Minimal AC joint degenerative changes.
Osseous demineralization.
No acute fracture, dislocation, or bone destruction.
Visualized right ribs unremarkable.
Pacemaker pack and sequential pacemaker leads project over right
chest.
IMPRESSION: No acute osseous abnormalities.

## 2012-04-15 IMAGING — CT CT L SPINE W/ CM
3 series · 12 of 33 positions shown, 14 images · IV contrast (Omnipaque 300)
Comparison: 07/23/2010.

CLINICAL DATA: Low back pain worse since surgery April 2010.  The
patient has a pacemaker.  Diabetic.

CT LUMBAR SPINE WITH CONTRAST
TECHNIQUE: Multidetector CT imaging of the lumbar spine was
performed with intravenous contrast administration. Multiplanar CT
image reconstructions were also generated.
Contrast: 100 ml [id number].

[Series 3: lumbar spine 2.0 b30s · axial · 0.33mm/px · z∈[-230,-94]mm · 4 of 100 slices shown, 5 images]
[im 16/100  soft-tissue]
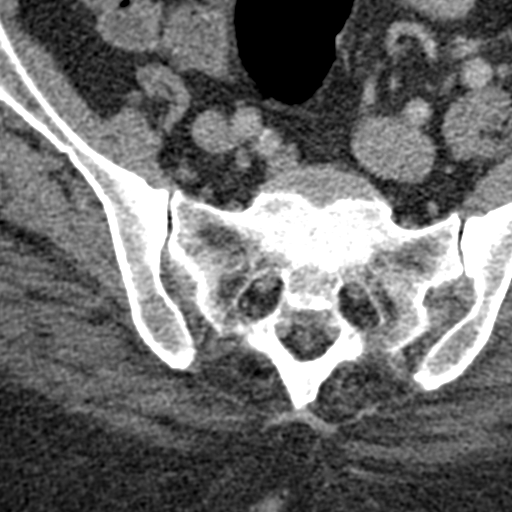
[im 16/100  bone]
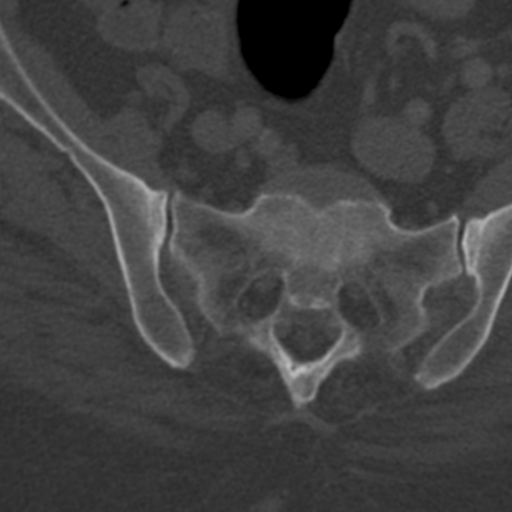
[im 39/100  bone]
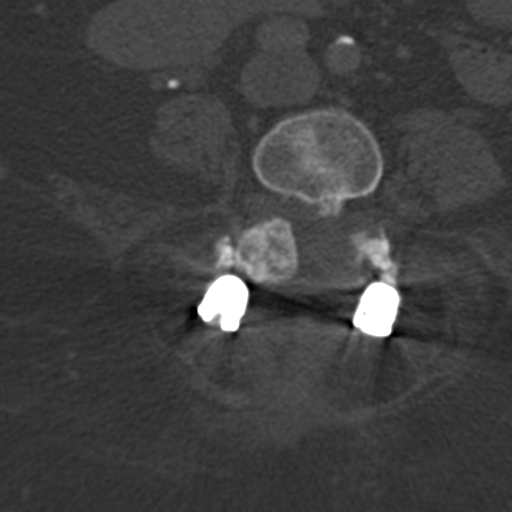
[im 61/100  bone]
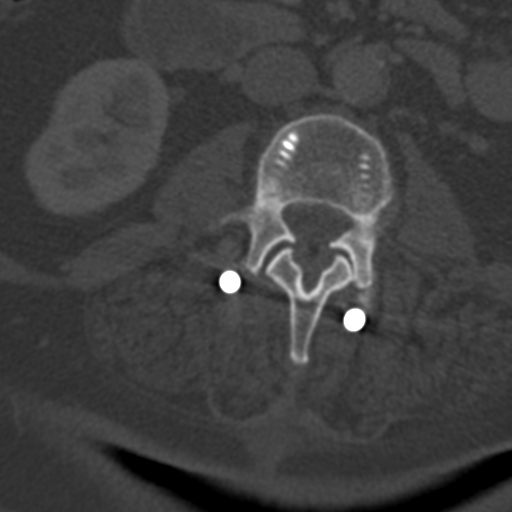
[im 84/100  bone]
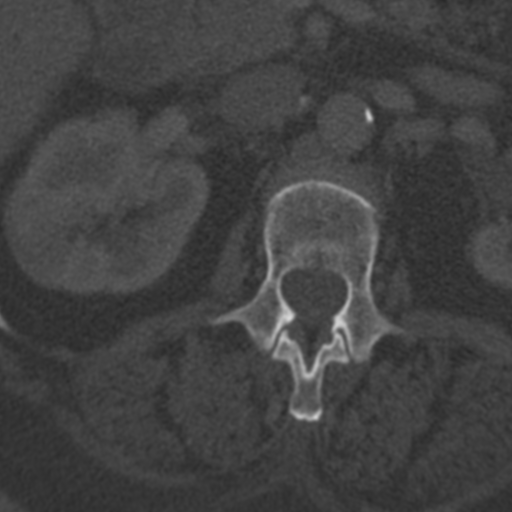

[Series 4: lumbar spine 2.0 spo cor · coronal · 0.30mm/px · 3 of 65 slices shown]
[im 13/65  bone]
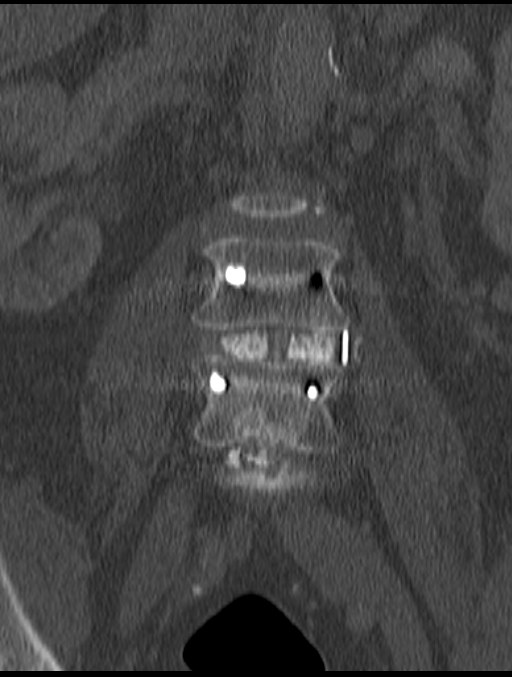
[im 26/65  bone]
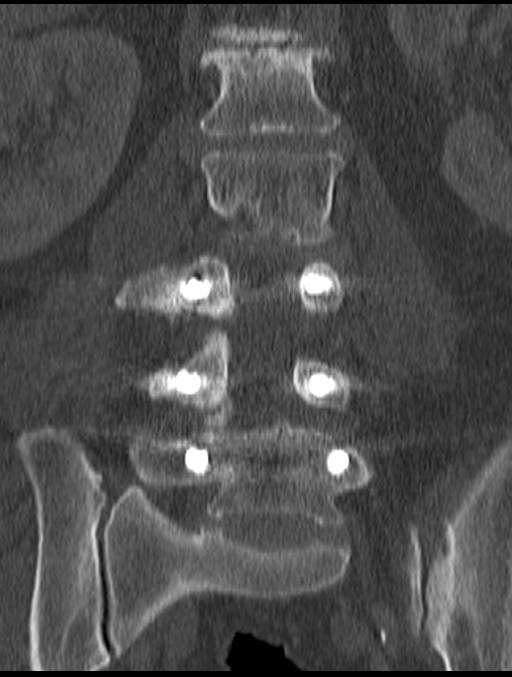
[im 39/65  bone]
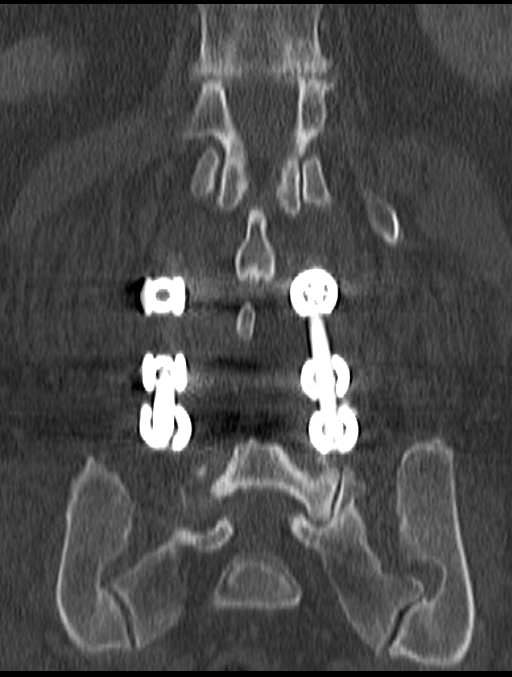

[Series 5: lumbar spine 2.0 spo sag · sagittal · 0.29mm/px · 5 of 70 slices shown, 6 images]
[im 24/70  bone]
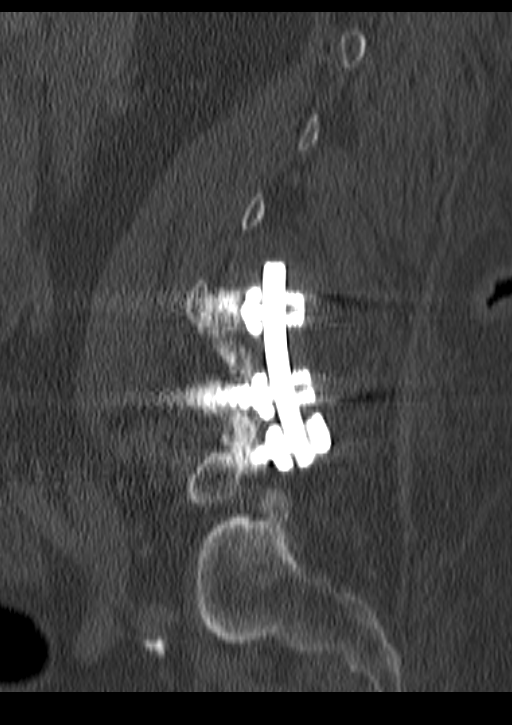
[im 29/70  bone]
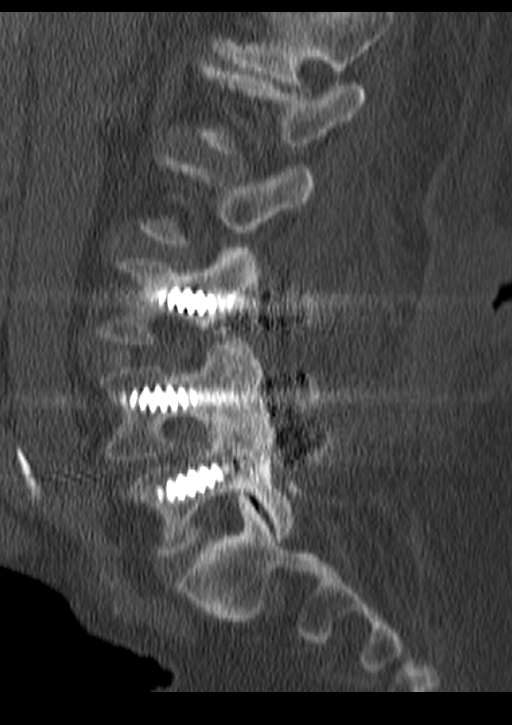
[im 35/70  soft-tissue]
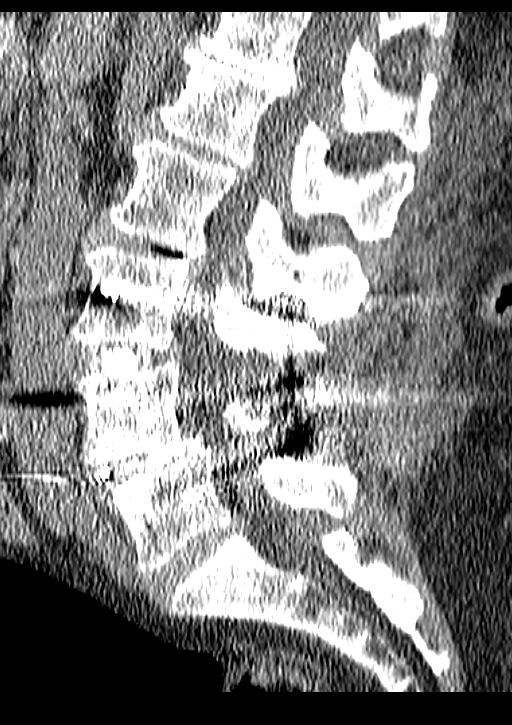
[im 35/70  bone]
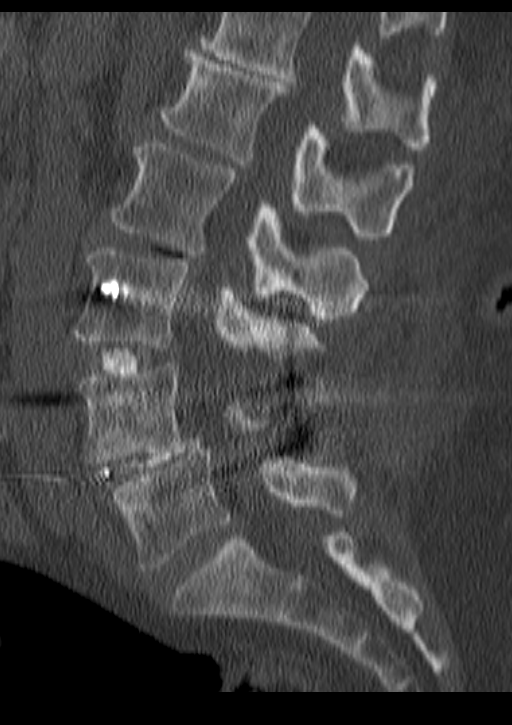
[im 41/70  bone]
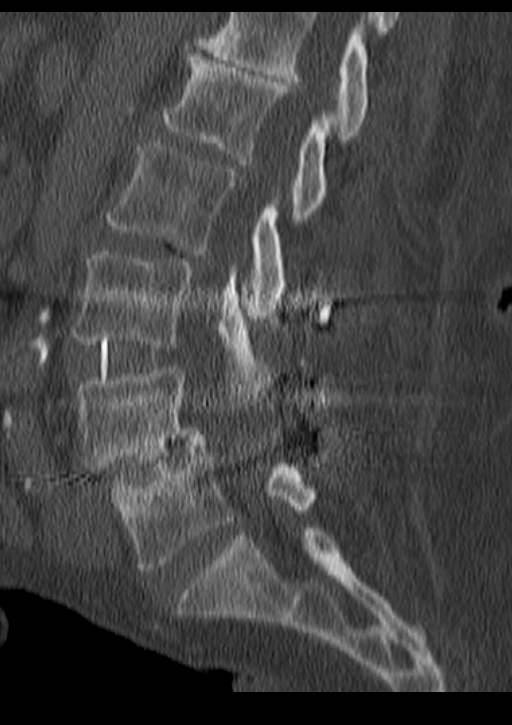
[im 47/70  bone]
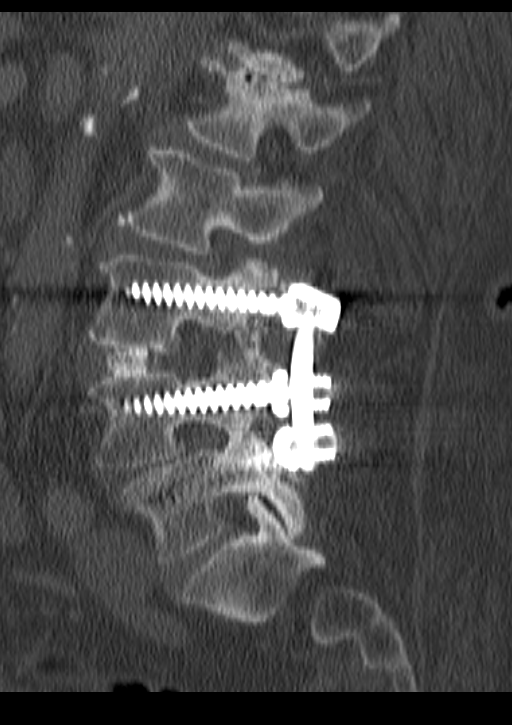

[12 of 33 positions shown; findings below may reference images not displayed]

FINDINGS: Last fully open disc space is labeled L5-S1.  Present
examination incorporates from T12-L1 through the lower sacrum.
Aorta is calcified as are branch vessels consistent with
atherosclerotic type changes similar to the prior exam.

T12-L1:  Moderate disc degeneration with broad-based disc
osteophyte complex contributing to the mild spinal stenosis and
bilateral foraminal narrowing.

L1-2:  Minimal retrolisthesis L1.  Mild to moderate bulge.  Mild
spinal stenosis.

L2-3:  Minimal retrolisthesis L2.  Disc degeneration with vacuum
disc.  Moderate bulge.  Facet joint degenerative changes.
Ligamentum flavum hypertrophy.  Slightly prominent dorsal epidural
fat.  Mild to moderate spinal stenosis and bilateral lateral recess
stenosis.  Mild bilateral foraminal narrowing.

L3-4:  Prior fusion with bilateral pedicle screws and posterior
connecting bar in place without evidence of loosening.  Interbody
spacer with bony incorporation on the left but not on the right.
Prior laminectomy.  Evaluation limited secondary to patient's
habitus, lack of 8 thecal sac contrast, postoperative scar and
metallic artifact without significant spinal stenosis or foraminal
narrowing detected.

L4-5:  Remote fusion with bilateral pedicle screws and posterior
connecting bar in place.  Fusion of the posterior elements.  Mild
anterior slip of L4 by 1.3 mm unchanged.  Small region of bony
incorporation across the disc space.  Evaluation limited by patient
habitus, lack of thecal sac contrast and metallic artifact.  Mild
bilateral foraminal narrowing.

L5-S1:  Mild bilateral facet joint degenerative changes.  Mild
bulge.  Mild spinal stenosis and mild bilateral foraminal
narrowing.
IMPRESSION: Postoperative changes L3-4 and L4-5 without significant change.

Most notable spinal stenosis is at the L2-3 level appearing mild to
moderate in degree and has progressed slightly since prior exam.

Please see above for further detail.

## 2012-04-18 ENCOUNTER — Encounter: Payer: PRIVATE HEALTH INSURANCE | Admitting: Internal Medicine

## 2012-04-19 ENCOUNTER — Encounter: Payer: Self-pay | Admitting: Internal Medicine

## 2012-04-19 ENCOUNTER — Ambulatory Visit (INDEPENDENT_AMBULATORY_CARE_PROVIDER_SITE_OTHER): Payer: PRIVATE HEALTH INSURANCE | Admitting: *Deleted

## 2012-04-19 DIAGNOSIS — I442 Atrioventricular block, complete: Secondary | ICD-10-CM

## 2012-04-19 DIAGNOSIS — I499 Cardiac arrhythmia, unspecified: Secondary | ICD-10-CM

## 2012-04-19 LAB — PACEMAKER DEVICE OBSERVATION
AL AMPLITUDE: 5.6 mv
AL THRESHOLD: 0.75 V
ATRIAL PACING PM: 84
RV LEAD IMPEDENCE PM: 626 Ohm
RV LEAD THRESHOLD: 1.25 V

## 2012-04-19 NOTE — Progress Notes (Signed)
Pacer check in clinic/pt forgot med list so unable to check meds.

## 2012-05-30 IMAGING — CR DG CHEST 1V PORT
1 series · 1 of 1 positions shown · non-contrast
Comparison: 04/07/2010 and earlier.

CLINICAL DATA: 69-year-old female with chest pain.  Pacemaker.

PORTABLE CHEST - 1 VIEW

[view not recorded]
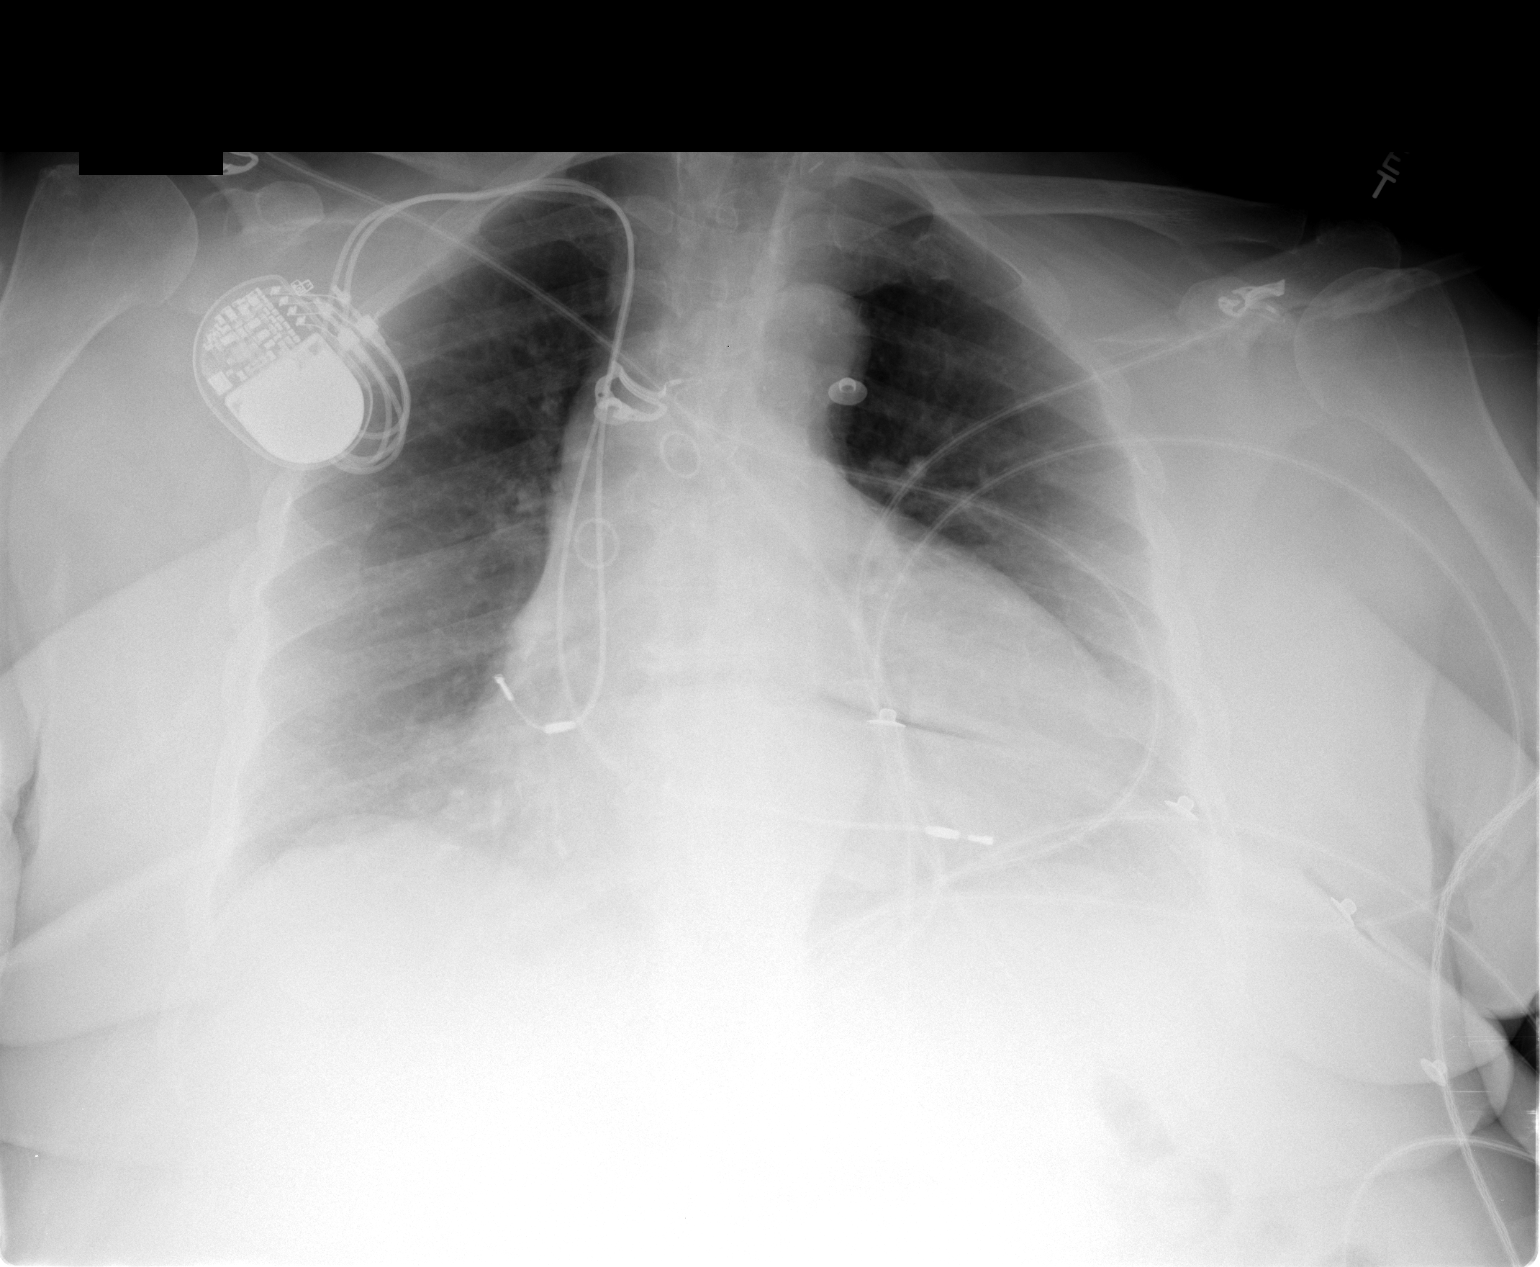

[1 of 1 positions shown; findings below may reference images not displayed]

FINDINGS: Portable semi upright AP view 1211 hours. Stable
cardiomegaly and mediastinal contours.  Stable right chest dual
lead cardiac pacemaker.  Sequelae of CABG.  No focal airspace
disease.  Questionable veiling bibasilar opacity.  No pneumothorax
or pulmonary edema.
IMPRESSION: No acute findings aside from possible bilateral pleural effusions.

## 2012-05-30 IMAGING — CR DG CHEST 1V
1 series · 1 of 1 positions shown · non-contrast
Comparison: 8168 hours the same day.

CLINICAL DATA: 69-year-old female with chest pain and shortness of
breath.  Query pleural effusion as suggested on earlier exam.

CHEST - 1 VIEW

[view not recorded]
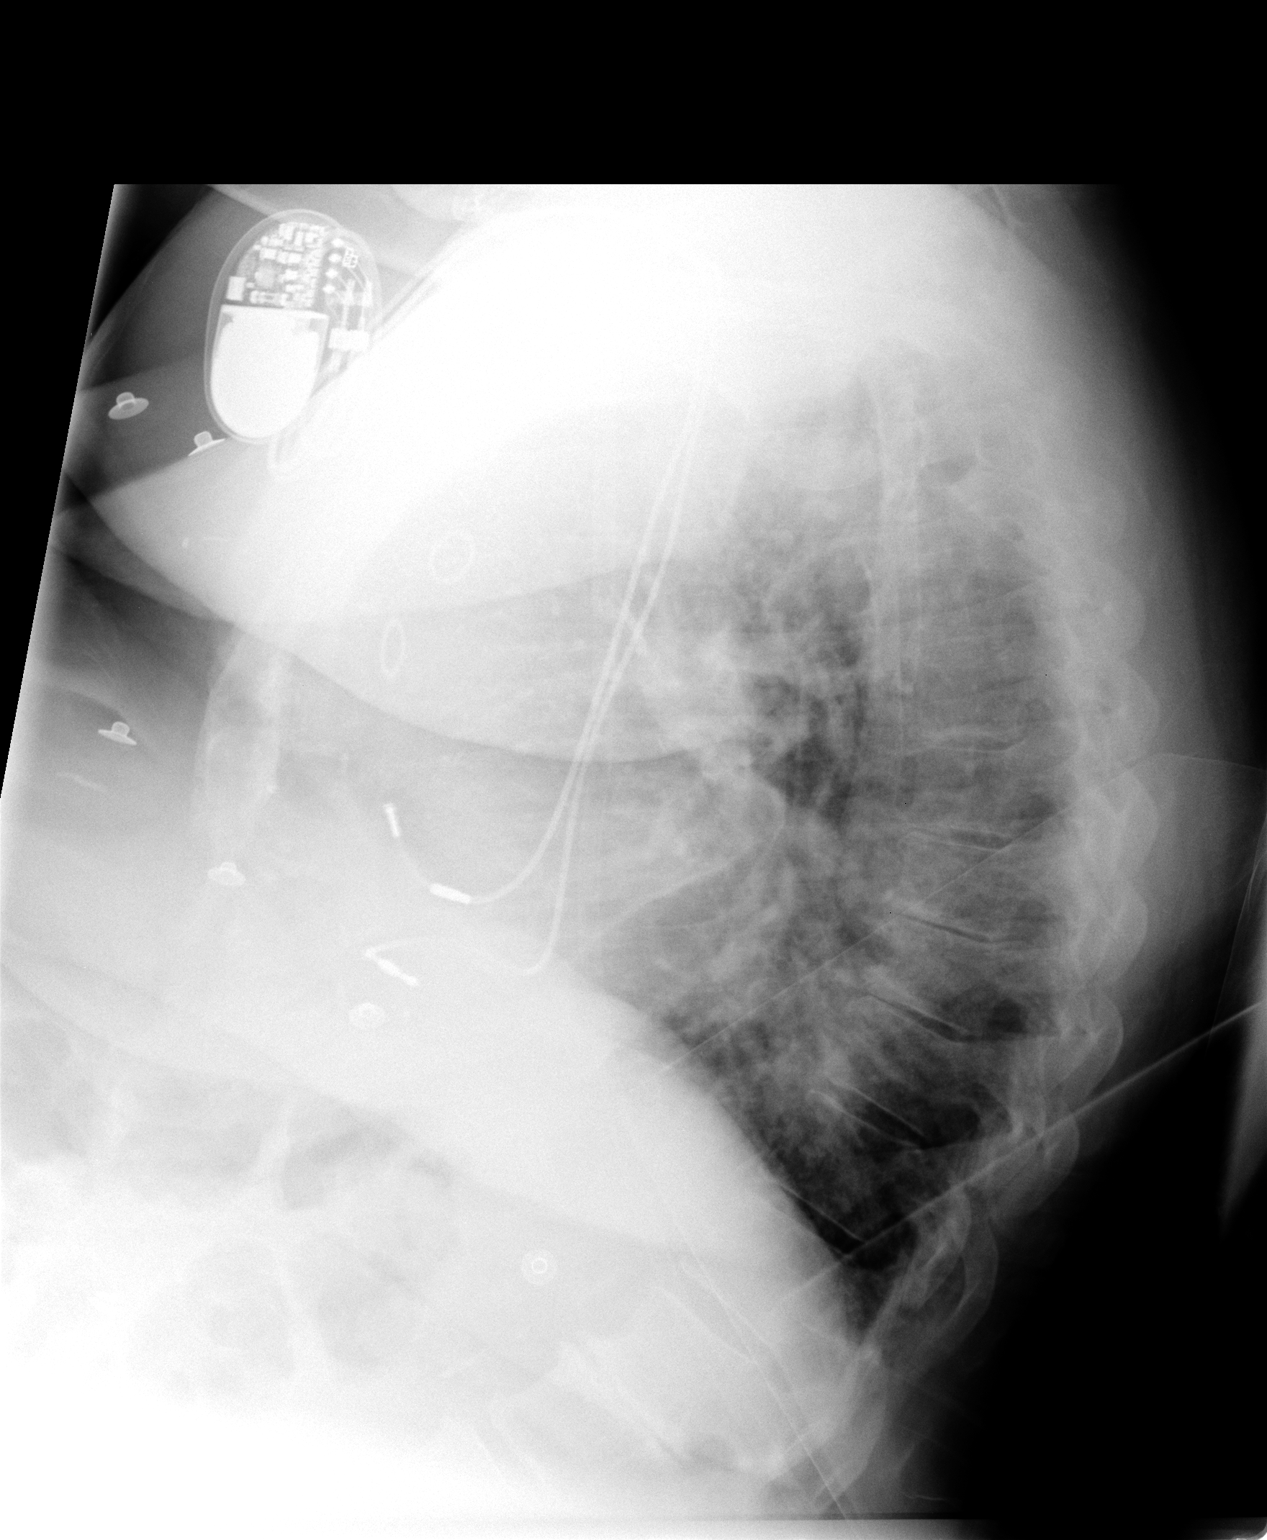

[1 of 1 positions shown; findings below may reference images not displayed]

FINDINGS: On the lateral view of the chest, there is no evidence of
pleural effusion.  Cardiomegaly, sequelae of CABG, and large body
habitus again noted.
IMPRESSION: No pleural effusions.

## 2012-06-11 ENCOUNTER — Encounter: Payer: PRIVATE HEALTH INSURANCE | Admitting: Internal Medicine

## 2012-07-16 IMAGING — CR DG CHEST 2V
2 series · 2 of 2 positions shown · non-contrast
Comparison: 12/02/2010 and earlier.

CLINICAL DATA: 69-year-old female with shortness of breath.

CHEST - 2 VIEW

[view not recorded (1 of 2)]
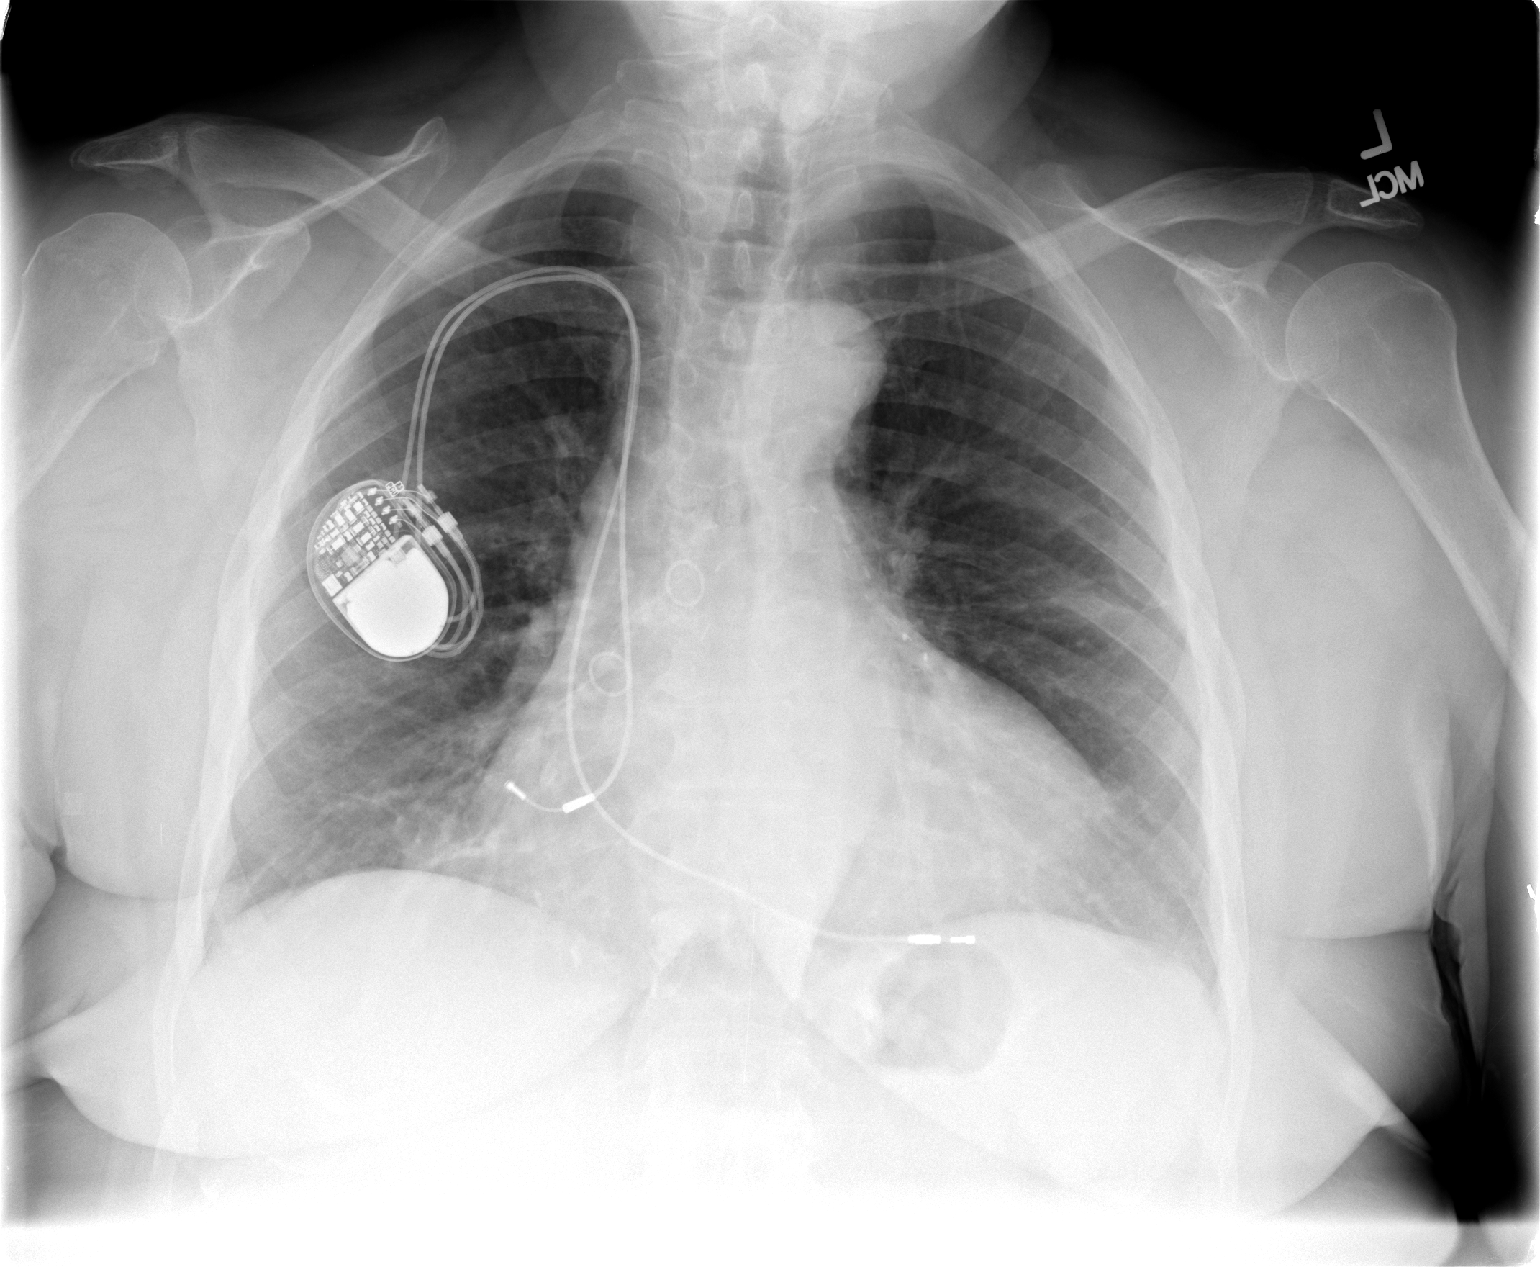

[view not recorded (2 of 2)]
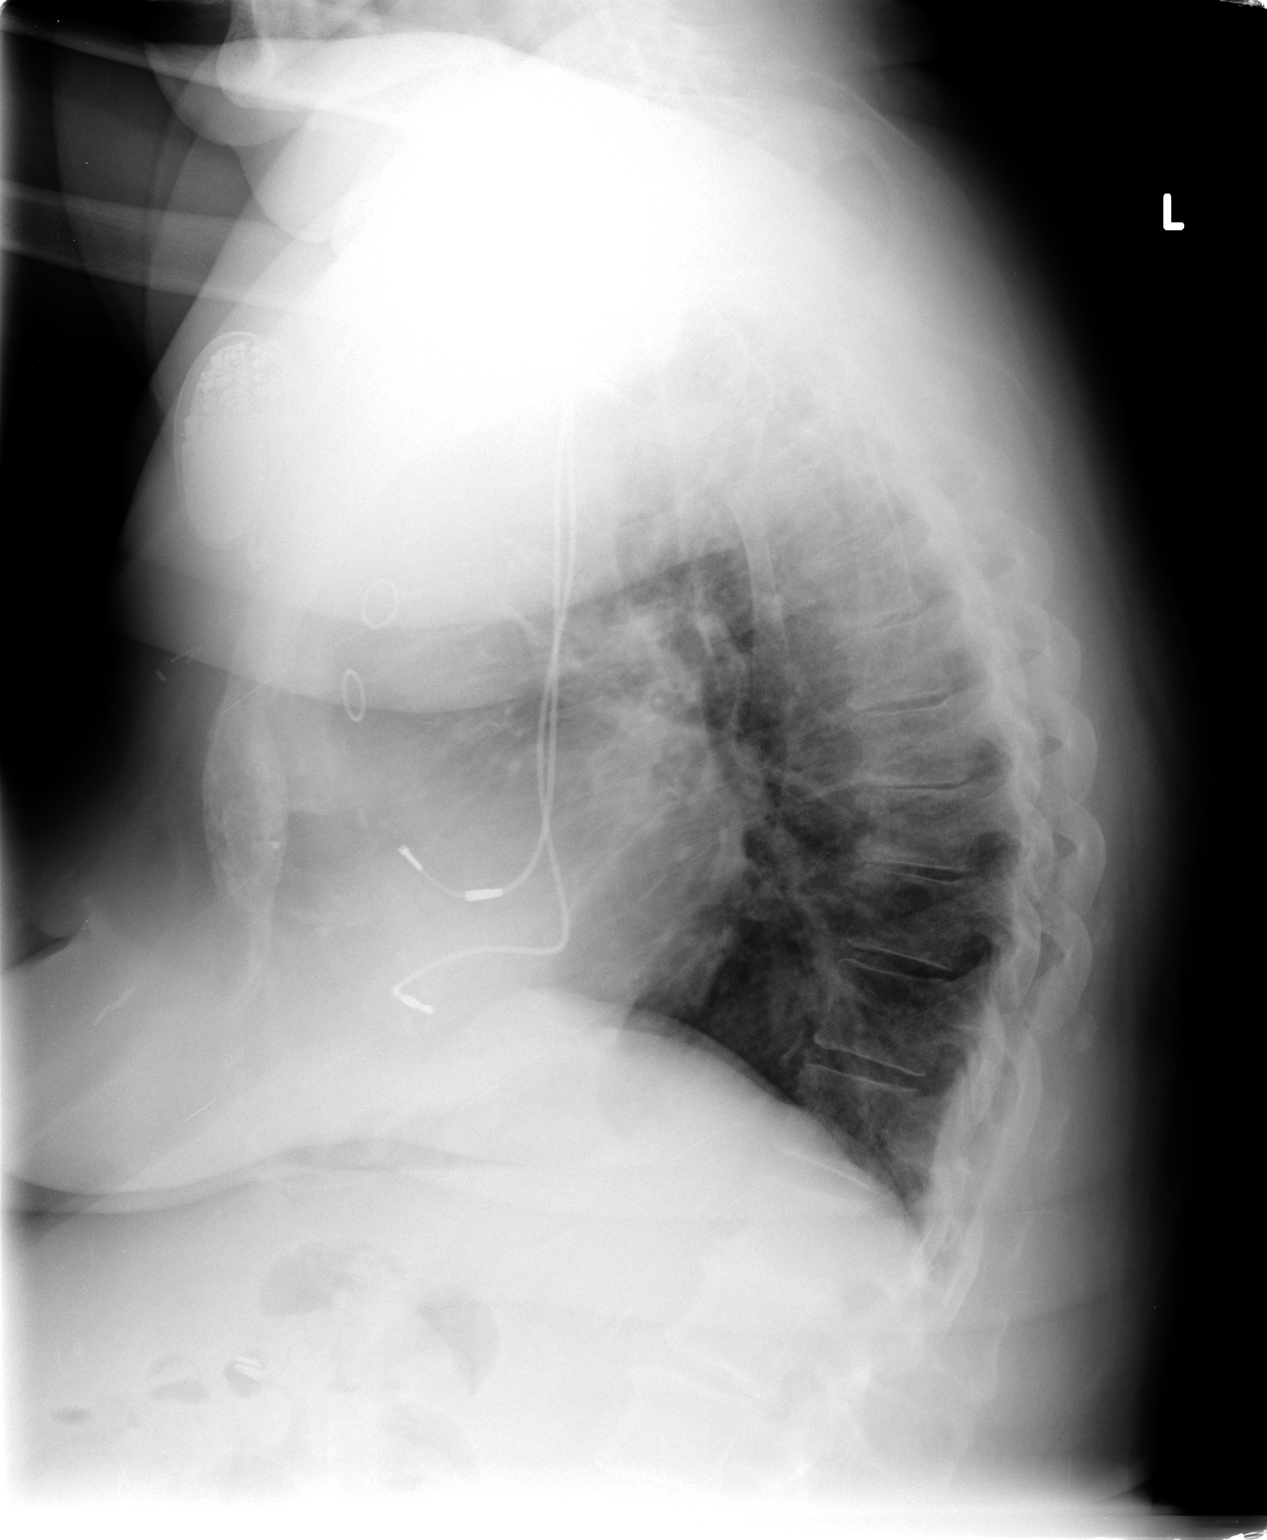

[2 of 2 positions shown; findings below may reference images not displayed]

FINDINGS: Stable cardiomegaly and mediastinal contours.  Stable
sequelae of CABG and right chest cardiac pacemaker.  Normal lung
volumes.  The lungs are clear.  No pneumothorax or effusion. No
acute osseous abnormality identified.
IMPRESSION: Stable cardiomegaly.  No acute cardiopulmonary abnormality.

## 2012-07-20 ENCOUNTER — Encounter: Payer: Self-pay | Admitting: Internal Medicine

## 2012-07-20 ENCOUNTER — Ambulatory Visit (INDEPENDENT_AMBULATORY_CARE_PROVIDER_SITE_OTHER): Payer: PRIVATE HEALTH INSURANCE | Admitting: Internal Medicine

## 2012-07-20 VITALS — BP 144/73 | HR 79 | Ht 62.0 in | Wt 239.0 lb

## 2012-07-20 DIAGNOSIS — J449 Chronic obstructive pulmonary disease, unspecified: Secondary | ICD-10-CM

## 2012-07-20 DIAGNOSIS — I499 Cardiac arrhythmia, unspecified: Secondary | ICD-10-CM

## 2012-07-20 DIAGNOSIS — I5032 Chronic diastolic (congestive) heart failure: Secondary | ICD-10-CM

## 2012-07-20 DIAGNOSIS — Z950 Presence of cardiac pacemaker: Secondary | ICD-10-CM

## 2012-07-20 LAB — PACEMAKER DEVICE OBSERVATION
AL IMPEDENCE PM: 599 Ohm
ATRIAL PACING PM: 63
RV LEAD IMPEDENCE PM: 616 Ohm
VENTRICULAR PACING PM: 0

## 2012-07-20 MED ORDER — FUROSEMIDE 40 MG PO TABS: 80.0000 mg | ORAL_TABLET | Freq: Two times a day (BID) | ORAL | Status: DC

## 2012-07-20 NOTE — Progress Notes (Signed)
HPI [name] returns today for pacemaker followup. She is a 71 year old woman with chronic diastolic heart failure, COPD on home oxygen, symptomatic bradycardia, status post permanent pacemaker insertion. She also has obesity, and chronic arthritis. Her main complaint today is back pain. She also complains of dyspnea. She states that her home oxygen is not working. She denies syncope or near-syncope. No chest pain. Allergies  Allergen Reactions  . Codeine     "good" headache  . Other     Leafy raw vegetables and seeds  . Oxycodone Hcl     "good" headache     Current Outpatient Prescriptions  Medication Sig Dispense Refill  . amLODipine (NORVASC) 10 MG tablet Take 10 mg by mouth daily.      Marland Kitchen aspirin 81 MG tablet Take 1 tablet (81 mg total) by mouth daily.      Marland Kitchen atorvastatin (LIPITOR) 40 MG tablet Take 1 tablet (40 mg total) by mouth daily at 6 PM.  30 tablet  6  . benazepril (LOTENSIN) 20 MG tablet Take 20 mg by mouth 2 (two) times daily.      . Bromfenac Sodium (BROMDAY) 0.09 % SOLN Place 1 drop into the right eye 3 (three) times daily.      Marland Kitchen doxepin (SINEQUAN) 25 MG capsule Take 25 mg by mouth at bedtime.      Marland Kitchen estradiol (ESTRACE) 0.5 MG tablet Take 0.5 mg by mouth daily.        . fish oil-omega-3 fatty acids 1000 MG capsule Take 1 g by mouth 2 (two) times daily.        . furosemide (LASIX) 40 MG tablet Take 2 tablets (80 mg total) by mouth 2 (two) times daily.  120 tablet  3  . gabapentin (NEURONTIN) 300 MG capsule Take 300 mg by mouth 3 (three) times daily.      . hydrALAZINE (APRESOLINE) 25 MG tablet Take 1 tablet (25 mg total) by mouth every 8 (eight) hours.  90 tablet  6  . ketoprofen (ORUDIS) 50 MG capsule Take 50 mg by mouth 3 (three) times daily.      . metFORMIN (GLUCOPHAGE) 500 MG tablet Take 500 mg by mouth 2 (two) times daily with a meal.       . metoCLOPramide (REGLAN) 10 MG tablet Take 10 mg by mouth 4 (four) times daily.       . metoprolol tartrate (LOPRESSOR) 25 MG  tablet Take 0.5 tablets (12.5 mg total) by mouth 2 (two) times daily.  30 tablet  6  . nitroGLYCERIN (NITROSTAT) 0.4 MG SL tablet Place 1 tablet (0.4 mg total) under the tongue every 5 (five) minutes as needed for chest pain (Up to 3 doses).  25 tablet  3  . NON FORMULARY Oxygen at night. 2 litters       . OVER THE COUNTER MEDICATION Take 2 tablets by mouth 2 (two) times daily.      . phenytoin (DILANTIN) 100 MG ER capsule Take 300 mg by mouth at bedtime.        . potassium chloride (KLOR-CON) 10 MEQ CR tablet Take 10 mEq by mouth daily.      Marland Kitchen senna-docusate (SENOKOT-S) 8.6-50 MG per tablet Take 1 tablet by mouth daily as needed.         Past Medical History  Diagnosis Date  . Diabetes mellitus   . HTN (hypertension)   . Seizure disorder   . CAD (coronary artery disease)     multivessel, CABG  6/10.  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  Myoview (11/20/11) showed no ischemia & EF 62%  . GERD (gastroesophageal reflux disease)   . Hyperlipidemia   . DVT (deep venous thrombosis)     R arm venous occlusion s/p prior PPM  . Obesity   . DDD (degenerative disc disease)   . Complete heart block 1993    s/p PPM, most recent generator change 2003  . Dehiscence of closure of sternum or sternotomy 8/10    required flap  . COPD (chronic obstructive pulmonary disease)     severe lung disease by PFTs 6/12  . Asthma   . Sleep apnea   . Chronic kidney disease   . Wound infection     Sternal  . H/O: hysterectomy     ROS:   All systems reviewed and negative except as noted in the HPI.   Past Surgical History  Procedure Date  . Pacemaker placement 1993    Status post DDD pacemaker implantation in 1993, with Medtronic Kappa generator change in 2003 for  second-degree AV block, most recent gen change by Fawn Kirk 04/14/11  . Revision total hip arthroplasty   . Replacement total knee   . Thyroid surgery   . Wrist surgery   . Coronary artery bypass graft     - LIMA to LAD, SVG to OM, SVG to PDA  . Reconstructive  repair sternal     for infection s/p CABG 8/10  . Back surgery   . Esophagogastroduodenoscopy     with [facility] dilation,  biopsy, and disruption Schatzki's ring  . Colonoscopy 04/27/2005     Family History  Problem Relation Age of Onset  . Diabetes    . Heart disease Mother     MI  . Coronary artery disease      Female <55  . Cancer       History   Social History  . Marital Status: Married    Spouse Name: N/A    Number of Children: N/A  . Years of Education: N/A   Occupational History  . Not on file.   Social History Main Topics  . Smoking status: Former Games developer  . Smokeless tobacco: Never Used  . Alcohol Use: No  . Drug Use: No  . Sexually Active: Not Currently    Birth Control/ Protection: Post-menopausal   Other Topics Concern  . Not on file   Social History Narrative  . No narrative on file     BP 144/73  Pulse 79  Ht 5\' 2"  (1.575 m)  Wt 239 lb (108.41 kg)  BMI 43.71 kg/m2  Physical Exam:  Chronically ill appearing 71 year old woman, NAD HEENT: Unremarkable Neck:  7 cm JVD, no thyromegally Back:  Musculoskeletal tenderness is present in the lower back region. Lungs:  Clear except for rales in the bases bilaterally. No wheezes or rhonchi. No increased work of breathing. HEART:  Regular rate rhythm, no murmurs, no rubs, no clicks Abd:  soft, positive bowel sounds, no organomegally, no rebound, no guarding Ext:  2 plus pulses, no edema, no cyanosis, no clubbing Skin:  No rashes no nodules Neuro:  CN II through XII intact, motor grossly intact  DEVICE  Normal device function.  See PaceArt for details.   Assess/Plan:

## 2012-07-20 NOTE — Assessment & Plan Note (Signed)
The patient is on home oxygen but states that the machine is not working. I've asked the patient to call her oxygen therapy supplier for assistance.

## 2012-07-20 NOTE — Assessment & Plan Note (Signed)
At least a portion of her symptoms are related to chronic diastolic heart failure. She admits to dietary indiscretion. I've asked the patient to reduce her salt intake and to increase her Lasix to 80 mg twice daily. In addition, she is encouraged to increase her physical activity.

## 2012-07-20 NOTE — Assessment & Plan Note (Signed)
Her pacemaker is working normally. We'll plan to recheck in several months. 

## 2012-07-20 NOTE — Patient Instructions (Addendum)
Remote monitoring is used to monitor your Pacemaker of ICD from home. This monitoring reduces the number of office visits required to check your device to one time per year. It allows [place] to keep an eye on the functioning of your device to ensure it is working properly. You are scheduled for a device check from home on 10/22/2012. You may send your transmission at any time that day. If you have a wireless device, the transmission will be sent automatically. After your physician reviews your transmission, you will receive a postcard with your next transmission date.  Your physician wants you to follow-up in: 1 year with Dr. Ladona Ridgel.  You will receive a reminder letter in the mail two months in advance. If you don't receive a letter, please call our office to schedule the follow-up appointment.  Your physician recommends that you schedule a follow-up appointment in: 3 months with Dr Patty Sermons  Your physician has recommended you make the following change in your medication: Furosemide 80 mg twice daily

## 2012-07-24 ENCOUNTER — Encounter: Payer: Self-pay | Admitting: Internal Medicine

## 2012-08-24 ENCOUNTER — Emergency Department (HOSPITAL_COMMUNITY)
Admission: EM | Admit: 2012-08-24 | Discharge: 2012-08-24 | Disposition: A | Discharge status: Home / Self Care | Payer: PRIVATE HEALTH INSURANCE | Attending: Emergency Medicine | Admitting: Emergency Medicine

## 2012-08-24 ENCOUNTER — Emergency Department (HOSPITAL_COMMUNITY): Payer: PRIVATE HEALTH INSURANCE

## 2012-08-24 ENCOUNTER — Encounter (HOSPITAL_COMMUNITY): Payer: Self-pay | Admitting: Emergency Medicine

## 2012-08-24 DIAGNOSIS — Z87891 Personal history of nicotine dependence: Secondary | ICD-10-CM | POA: Insufficient documentation

## 2012-08-24 DIAGNOSIS — Z950 Presence of cardiac pacemaker: Secondary | ICD-10-CM | POA: Insufficient documentation

## 2012-08-24 DIAGNOSIS — G4730 Sleep apnea, unspecified: Secondary | ICD-10-CM | POA: Insufficient documentation

## 2012-08-24 DIAGNOSIS — E785 Hyperlipidemia, unspecified: Secondary | ICD-10-CM | POA: Insufficient documentation

## 2012-08-24 DIAGNOSIS — Z951 Presence of aortocoronary bypass graft: Secondary | ICD-10-CM | POA: Insufficient documentation

## 2012-08-24 DIAGNOSIS — R112 Nausea with vomiting, unspecified: Secondary | ICD-10-CM | POA: Insufficient documentation

## 2012-08-24 DIAGNOSIS — G40909 Epilepsy, unspecified, not intractable, without status epilepticus: Secondary | ICD-10-CM | POA: Insufficient documentation

## 2012-08-24 DIAGNOSIS — I252 Old myocardial infarction: Secondary | ICD-10-CM | POA: Insufficient documentation

## 2012-08-24 DIAGNOSIS — J4489 Other specified chronic obstructive pulmonary disease: Secondary | ICD-10-CM | POA: Insufficient documentation

## 2012-08-24 DIAGNOSIS — IMO0002 Reserved for concepts with insufficient information to code with codable children: Secondary | ICD-10-CM | POA: Insufficient documentation

## 2012-08-24 DIAGNOSIS — Z886 Allergy status to analgesic agent status: Secondary | ICD-10-CM | POA: Insufficient documentation

## 2012-08-24 DIAGNOSIS — K219 Gastro-esophageal reflux disease without esophagitis: Secondary | ICD-10-CM | POA: Insufficient documentation

## 2012-08-24 DIAGNOSIS — Z86718 Personal history of other venous thrombosis and embolism: Secondary | ICD-10-CM | POA: Insufficient documentation

## 2012-08-24 DIAGNOSIS — I10 Essential (primary) hypertension: Secondary | ICD-10-CM | POA: Insufficient documentation

## 2012-08-24 DIAGNOSIS — E119 Type 2 diabetes mellitus without complications: Secondary | ICD-10-CM | POA: Insufficient documentation

## 2012-08-24 DIAGNOSIS — I2510 Atherosclerotic heart disease of native coronary artery without angina pectoris: Secondary | ICD-10-CM | POA: Insufficient documentation

## 2012-08-24 DIAGNOSIS — J449 Chronic obstructive pulmonary disease, unspecified: Secondary | ICD-10-CM | POA: Insufficient documentation

## 2012-08-24 LAB — URINALYSIS, ROUTINE W REFLEX MICROSCOPIC
Nitrite: NEGATIVE
Specific Gravity, Urine: 1.01 (ref 1.005–1.030)
Urobilinogen, UA: 0.2 mg/dL (ref 0.0–1.0)
pH: 6.5 (ref 5.0–8.0)

## 2012-08-24 LAB — CBC WITH DIFFERENTIAL/PLATELET
Basophils Absolute: 0 10*3/uL (ref 0.0–0.1)
Basophils Relative: 0 % (ref 0–1)
Eosinophils Absolute: 0 10*3/uL (ref 0.0–0.7)
Hemoglobin: 10.2 g/dL — ABNORMAL LOW (ref 12.0–15.0)
MCH: 28.3 pg (ref 26.0–34.0)
MCHC: 32.7 g/dL (ref 30.0–36.0)
Monocytes Relative: 5 % (ref 3–12)
Neutro Abs: 8 10*3/uL — ABNORMAL HIGH (ref 1.7–7.7)
Neutrophils Relative %: 85 % — ABNORMAL HIGH (ref 43–77)
RBC: 3.61 MIL/uL — ABNORMAL LOW (ref 3.87–5.11)
RDW: 13.7 % (ref 11.5–15.5)
WBC: 9.5 10*3/uL (ref 4.0–10.5)

## 2012-08-24 LAB — COMPREHENSIVE METABOLIC PANEL
AST: 25 U/L (ref 0–37)
Albumin: 4.1 g/dL (ref 3.5–5.2)
Alkaline Phosphatase: 87 U/L (ref 39–117)
BUN: 11 mg/dL (ref 6–23)
Creatinine, Ser: 0.92 mg/dL (ref 0.50–1.10)
Potassium: 3.3 mEq/L — ABNORMAL LOW (ref 3.5–5.1)
Total Protein: 8.4 g/dL — ABNORMAL HIGH (ref 6.0–8.3)

## 2012-08-24 LAB — PHENYTOIN LEVEL, TOTAL: Phenytoin Lvl: <2.5 ug/mL — ABNORMAL LOW (ref 10.0–20.0)

## 2012-08-24 LAB — TROPONIN I: Troponin I: <0.3 ng/mL (ref <0.30)

## 2012-08-24 LAB — LIPASE, BLOOD: Lipase: 30 U/L (ref 11–59)

## 2012-08-24 MED ORDER — ONDANSETRON HCL 4 MG/2ML IJ SOLN
4.0000 mg | Freq: Once | INTRAMUSCULAR | Status: AC
  Administered 2012-08-24: 4 mg via INTRAVENOUS
  Filled 2012-08-24: qty 2

## 2012-08-24 MED ORDER — SODIUM CHLORIDE 0.9 % IV SOLN
Freq: Once | INTRAVENOUS | Status: AC
  Administered 2012-08-24: 17:00:00 via INTRAVENOUS

## 2012-08-24 MED ORDER — ONDANSETRON 8 MG PO TBDP: ORAL_TABLET | ORAL | Status: DC

## 2012-08-24 MED ORDER — KETOROLAC TROMETHAMINE 30 MG/ML IJ SOLN
30.0000 mg | Freq: Once | INTRAMUSCULAR | Status: AC
  Administered 2012-08-24: 30 mg via INTRAVENOUS
  Filled 2012-08-24: qty 1

## 2012-08-24 MED ORDER — SODIUM CHLORIDE 0.9 % IV BOLUS (SEPSIS)
1000.0000 mL | Freq: Once | INTRAVENOUS | Status: AC
  Administered 2012-08-24: 1000 mL via INTRAVENOUS

## 2012-08-24 NOTE — ED Notes (Signed)
Pt. Receiving 2 lpm via [place] due to low 02 sat.

## 2012-08-24 NOTE — ED Provider Notes (Signed)
History   This chart was scribed for Geoffery Lyons, MD by Gerlean Ren. This patient was seen in room APA18/APA18 and the patient's care was started at 1:55PM.   CSN: [id number]  Arrival date & time 08/24/12  1331   First MD Initiated Contact with Patient 08/24/12 1352      Chief Complaint  Patient presents with  . Emesis    (Consider location/radiation/quality/duration/timing/severity/associated sxs/prior treatment) Patient is a 71 y.o. female presenting with vomiting. The history is provided by the patient. No language interpreter was used.  Emesis    [name] is a 72 y.o. female who presents to the Emergency Department complaining of 3 days of constant non-bloody emesis.  Pt denies diarrhea, abdominal pain, urinary symptoms, and any irregular bowel movements as associated.  Pt denies h/o similar symptoms, pancreatitis, and hepatitis.   Pt has h/o DM, HTN, CAD, COPD, and asthma.  Pt has had cholecystectomy.    Past Medical History  Diagnosis Date  . Diabetes mellitus   . HTN (hypertension)   . Seizure disorder   . CAD (coronary artery disease)     multivessel, CABG 6/10.  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  Myoview (11/20/11) showed no ischemia & EF 62%  . GERD (gastroesophageal reflux disease)   . Hyperlipidemia   . DVT (deep venous thrombosis)     R arm venous occlusion s/p prior PPM  . Obesity   . DDD (degenerative disc disease)   . Complete heart block 1993    s/p PPM, most recent generator change 2003  . Dehiscence of closure of sternum or sternotomy 8/10    required flap  . COPD (chronic obstructive pulmonary disease)     severe lung disease by PFTs 6/12  . Asthma   . Sleep apnea   . Chronic kidney disease   . Wound infection     Sternal  . H/O: hysterectomy     Past Surgical History  Procedure Date  . Pacemaker placement 1993    Status post DDD pacemaker implantation in 1993, with Medtronic Kappa generator change in 2003 for  second-degree AV block, most recent gen  change by Fawn Kirk 04/14/11  . Revision total hip arthroplasty   . Replacement total knee   . Thyroid surgery   . Wrist surgery   . Coronary artery bypass graft     - LIMA to LAD, SVG to OM, SVG to PDA  . Reconstructive repair sternal     for infection s/p CABG 8/10  . Back surgery   . Esophagogastroduodenoscopy     with [facility] dilation,  biopsy, and disruption Schatzki's ring  . Colonoscopy 04/27/2005    Family History  Problem Relation Age of Onset  . Diabetes    . Heart disease Mother     MI  . Coronary artery disease      Female <55  . Cancer      History  Substance Use Topics  . Smoking status: Former Games developer  . Smokeless tobacco: Never Used  . Alcohol Use: No    No OB history provided.  Review of Systems  Gastrointestinal: Positive for vomiting.  All other systems reviewed and are negative.    Allergies  Codeine; Other; and Oxycodone hcl  Home Medications   Current Outpatient Rx  Name Route Sig Dispense Refill  . AMLODIPINE BESYLATE 10 MG PO TABS Oral Take 10 mg by mouth daily.    . ASPIRIN 81 MG PO TABS Oral Take 1 tablet (81 mg total) by  mouth daily.    . ATORVASTATIN CALCIUM 40 MG PO TABS Oral Take 1 tablet (40 mg total) by mouth daily at 6 PM. 30 tablet 6  . BENAZEPRIL HCL 20 MG PO TABS Oral Take 20 mg by mouth 2 (two) times daily.    Marland Kitchen BROMFENAC SODIUM (ONCE-DAILY) 0.09 % OP SOLN Right Eye Place 1 drop into the right eye 3 (three) times daily.    Marland Kitchen DOXEPIN HCL 25 MG PO CAPS Oral Take 25 mg by mouth at bedtime.    Marland Kitchen ESTRADIOL 0.5 MG PO TABS Oral Take 0.5 mg by mouth daily.      . OMEGA-3 FATTY ACIDS 1000 MG PO CAPS Oral Take 1 g by mouth 2 (two) times daily.      . FUROSEMIDE 40 MG PO TABS Oral Take 2 tablets (80 mg total) by mouth 2 (two) times daily. 120 tablet 3  . GABAPENTIN 300 MG PO CAPS Oral Take 300 mg by mouth 3 (three) times daily.    Marland Kitchen HYDRALAZINE HCL 25 MG PO TABS Oral Take 1 tablet (25 mg total) by mouth every 8 (eight) hours. 90 tablet 6  .  KETOPROFEN 50 MG PO CAPS Oral Take 50 mg by mouth 3 (three) times daily.    Marland Kitchen METFORMIN HCL 500 MG PO TABS Oral Take 500 mg by mouth 2 (two) times daily with a meal.     . METOCLOPRAMIDE HCL 10 MG PO TABS Oral Take 10 mg by mouth 4 (four) times daily.     Marland Kitchen METOPROLOL TARTRATE 25 MG PO TABS Oral Take 0.5 tablets (12.5 mg total) by mouth 2 (two) times daily. 30 tablet 6  . NITROGLYCERIN 0.4 MG SL SUBL Sublingual Place 1 tablet (0.4 mg total) under the tongue every 5 (five) minutes as needed for chest pain (Up to 3 doses). 25 tablet 3  . NON FORMULARY  Oxygen at night. 2 litters     . OVER THE COUNTER MEDICATION Oral Take 2 tablets by mouth 2 (two) times daily.    Marland Kitchen PHENYTOIN SODIUM EXTENDED 100 MG PO CAPS Oral Take 300 mg by mouth at bedtime.      Marland Kitchen POTASSIUM CHLORIDE 10 MEQ PO TBCR Oral Take 10 mEq by mouth daily.    Bernadette Hoit SODIUM 8.6-50 MG PO TABS Oral Take 1 tablet by mouth daily as needed.      BP 192/84  Pulse 65  Resp 22  Ht 5\' 2"  (1.575 m)  Wt 250 lb (113.399 kg)  BMI 45.73 kg/m2  SpO2 94%  Physical Exam  Nursing note and vitals reviewed. Constitutional: [name] is oriented to person, place, and time. [name] appears well-developed and well-nourished.  HENT:  Head: Normocephalic and atraumatic.       Mucous membranes dry in mouth.  Eyes: Conjunctivae normal and EOM are normal.  Neck: Normal range of motion. Neck supple. No tracheal deviation present.  Cardiovascular: Normal rate, regular rhythm and normal heart sounds.   No murmur heard. Pulmonary/Chest: Effort normal and breath sounds normal. [name] has no wheezes.  Abdominal: Soft. Bowel sounds are normal. There is no rebound and no guarding.  Musculoskeletal: Normal range of motion. [name] exhibits no edema.       No CVA tenderness.  Neurological: [name] is alert and oriented to person, place, and time.  Skin:       Poor turgor.  Psychiatric: [name] has a normal mood and affect.    ED Course  Procedures (including  critical care time)  DIAGNOSTIC STUDIES: Oxygen Saturation is 94% on room air, adequate by my interpretation.    COORDINATION OF CARE: 2:00PM- Ordered IV fluids, toradol, zofran.   Labs Reviewed - No data to display No results found. Results for orders placed during the hospital encounter of 08/24/12  CBC WITH DIFFERENTIAL      Component Value Range   WBC 9.5  4.0 - 10.5 K/uL   RBC 3.61 (*) 3.87 - 5.11 MIL/uL   Hemoglobin 10.2 (*) 12.0 - 15.0 g/dL   HCT 54.0 (*) 98.1 - 19.1 %   MCV 86.4  78.0 - 100.0 fL   MCH 28.3  26.0 - 34.0 pg   MCHC 32.7  30.0 - 36.0 g/dL   RDW 47.8  29.5 - 62.1 %   Platelets 217  150 - 400 K/uL   Neutrophils Relative 85 (*) 43 - 77 %   Neutro Abs 8.0 (*) 1.7 - 7.7 K/uL   Lymphocytes Relative 10 (*) 12 - 46 %   Lymphs Abs 1.0  0.7 - 4.0 K/uL   Monocytes Relative 5  3 - 12 %   Monocytes Absolute 0.5  0.1 - 1.0 K/uL   Eosinophils Relative 0  0 - 5 %   Eosinophils Absolute 0.0  0.0 - 0.7 K/uL   Basophils Relative 0  0 - 1 %   Basophils Absolute 0.0  0.0 - 0.1 K/uL  COMPREHENSIVE METABOLIC PANEL      Component Value Range   Sodium 141  135 - 145 mEq/L   Potassium 3.3 (*) 3.5 - 5.1 mEq/L   Chloride 99  96 - 112 mEq/L   CO2 28  19 - 32 mEq/L   Glucose, Bld 146 (*) 70 - 99 mg/dL   BUN 11  6 - 23 mg/dL   Creatinine, Ser 3.08  0.50 - 1.10 mg/dL   Calcium 65.7  8.4 - 84.6 mg/dL   Total Protein 8.4 (*) 6.0 - 8.3 g/dL   Albumin 4.1  3.5 - 5.2 g/dL   AST 25  0 - 37 U/L   ALT 20  0 - 35 U/L   Alkaline Phosphatase 87  39 - 117 U/L   Total Bilirubin 0.2 (*) 0.3 - 1.2 mg/dL   GFR calc non Af Amer 60 (*) >90 mL/min   GFR calc Af Amer 70 (*) >90 mL/min  LIPASE, BLOOD      Component Value Range   Lipase 30  11 - 59 U/L  TROPONIN I      Component Value Range   Troponin I <0.30  <0.30 ng/mL  PHENYTOIN LEVEL, TOTAL      Component Value Range   Phenytoin Lvl <2.5 (*) 10.0 - 20.0 ug/mL     Dg Chest Port 1 View  08/24/2012  *RADIOLOGY REPORT*  Clinical Data:  Weakness, fever, vomiting, history diabetes, hypertension, coronary disease, COPD  PORTABLE CHEST - 1 VIEW  Comparison: Portable exam 1415 hours compared to 03/23/2012  Findings: Right subclavian sequential transveous pacemaker leads project at right atrium and right ventricle. Enlargement of cardiac silhouette post CABG. Tortuous aorta. Mediastinal contours and pulmonary vascularity otherwise normal. Lungs clear. No pleural effusion or pneumothorax.  IMPRESSION: Enlargement of cardiac silhouette post CABG and pacemaker. No acute abnormalities.   Original Report Authenticated By: Lollie Marrow, M.D.     No diagnosis found.   Date: 08/24/2012  Rate: 64  Rhythm: Atrial Paced  QRS Axis: left  Intervals: normal  ST/T Wave abnormalities: nonspecific T wave changes  Conduction Disutrbances:none  Narrative Interpretation:   Old EKG Reviewed: unchanged    MDM  The patient presents here with nausea and vomiting that sound viral in nature.  I have found nothing in the workup to suggest otherwise.  There is no elevation of wbc, the electrolytes are essentially unremarkable, and the cardiac workup is reassuring.  [name] is feeling better with fluids and zofran and will be discharged with the same.  To return prn if her symptoms worsen.    I personally performed the services described in this documentation, which was scribed in my presence. The recorded information has been reviewed and considered.           Geoffery Lyons, MD 08/24/12 [phone]

## 2012-08-24 NOTE — ED Notes (Signed)
Pt. Has been complain of Vomiting since Tues. 08/21/12

## 2012-09-03 IMAGING — CT CT ABD-PELV W/ CM
2 of 4 series · 17 of 46 positions shown, 19 images · IV contrast (agent unspecified)
Comparison: 10/24/2008.  Lumbar spine CT dated 10/18/2010.

CLINICAL DATA: Abdominal pain and swelling.  Bilateral foot
swelling for the past 3 weeks.  Previous cholecystectomy,
appendectomy, hysterectomy and hip replacement.

CT ABDOMEN AND PELVIS WITH CONTRAST
TECHNIQUE: Multidetector CT imaging of the abdomen and pelvis was
performed following the standard protocol during bolus
administration of intravenous contrast.
Contrast: 100 ml Vmnipaque-FOO

[Series 2: abd_pel_with 5.0 b40f · axial · 0.90mm/px · z∈[-433,-53]mm · 14 of 86 slices shown, 16 images]
[im 5/86  soft-tissue]
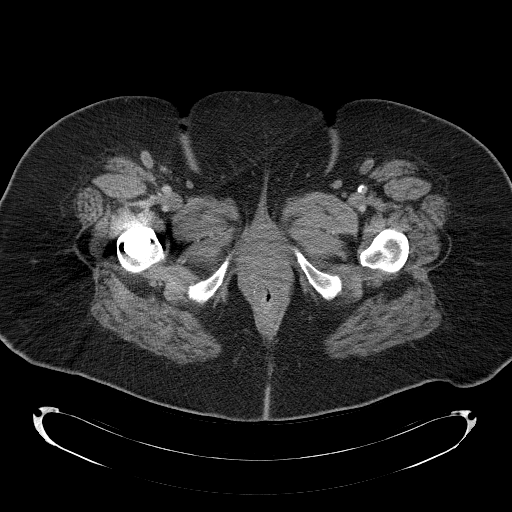
[im 5/86  bone]
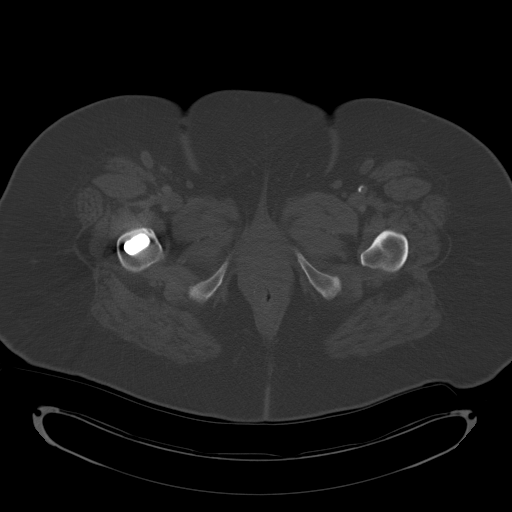
[im 13/86  soft-tissue]
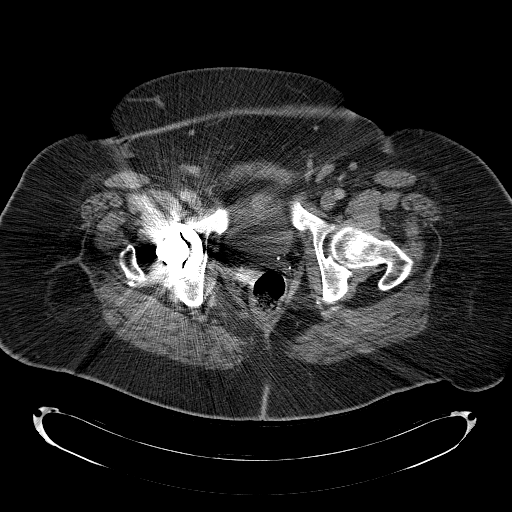
[im 17/86  soft-tissue]
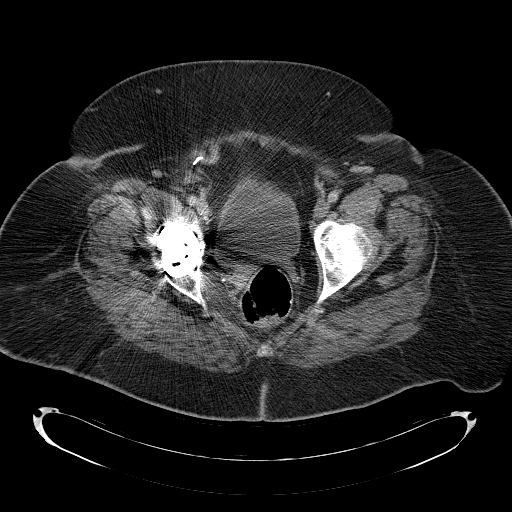
[im 25/86  soft-tissue]
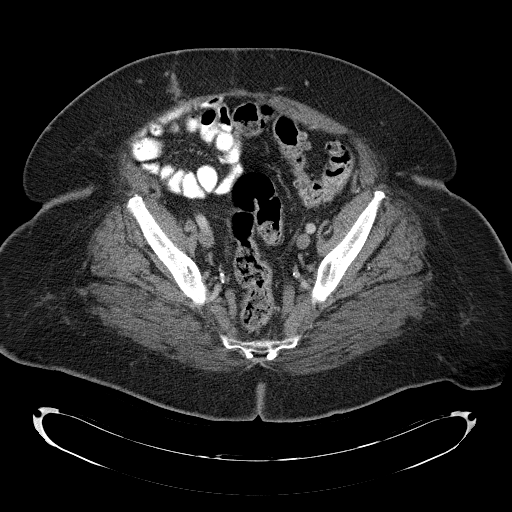
[im 29/86  soft-tissue]
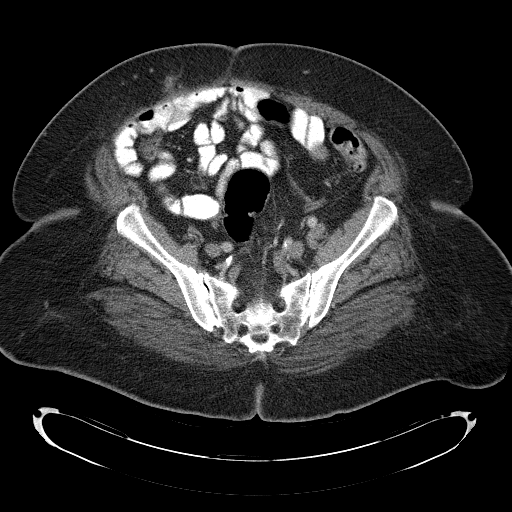
[im 33/86  soft-tissue]
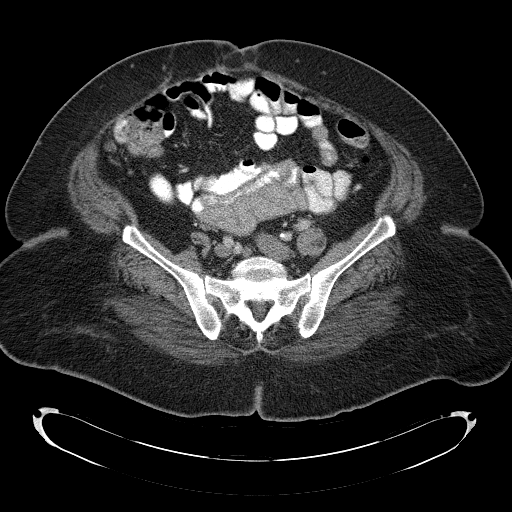
[im 41/86  soft-tissue]
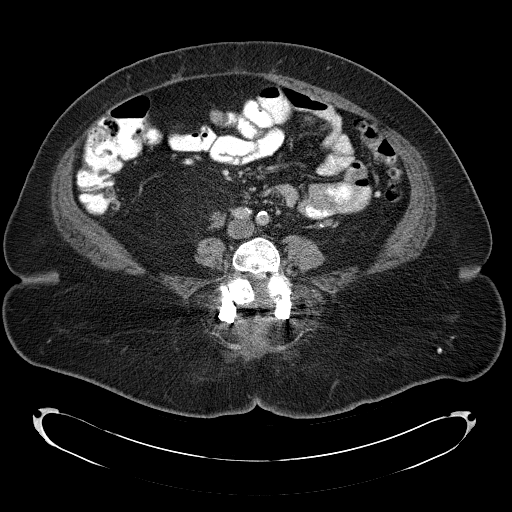
[im 45/86  soft-tissue]
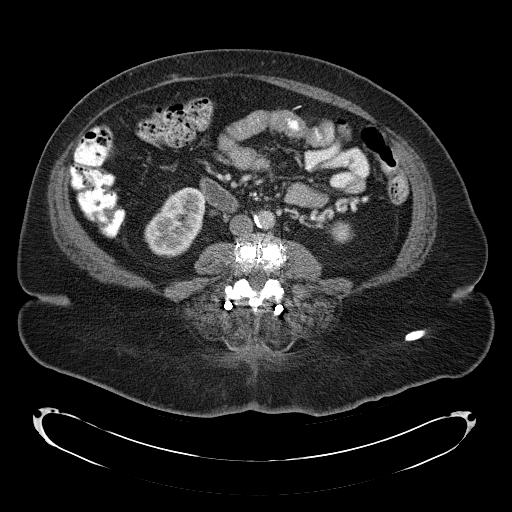
[im 53/86  soft-tissue]
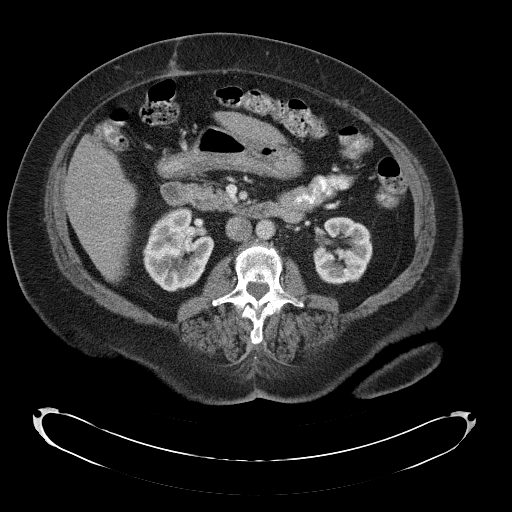
[im 53/86  bone]
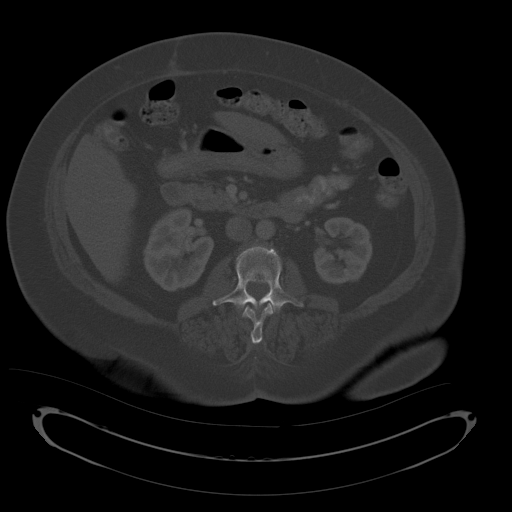
[im 57/86  soft-tissue]
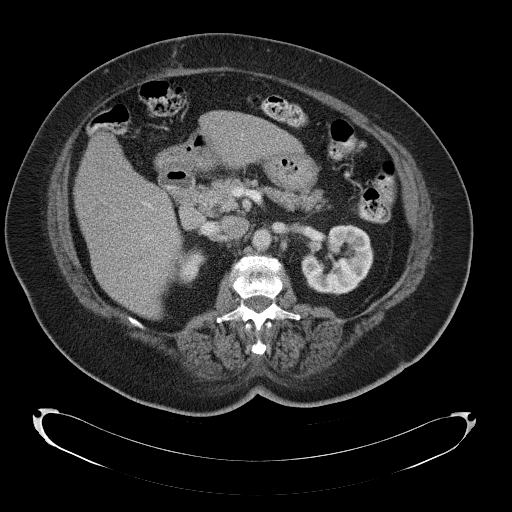
[im 65/86  soft-tissue]
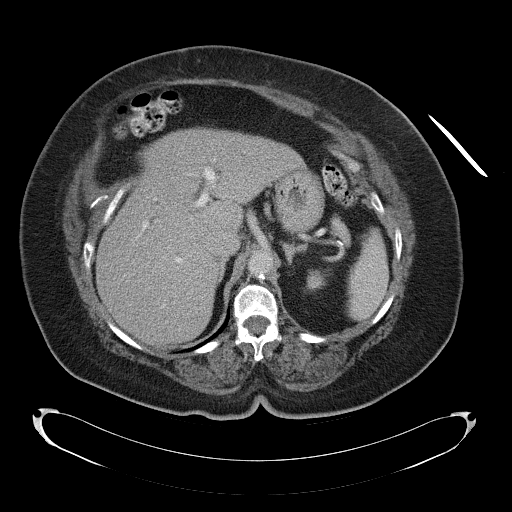
[im 69/86  soft-tissue]
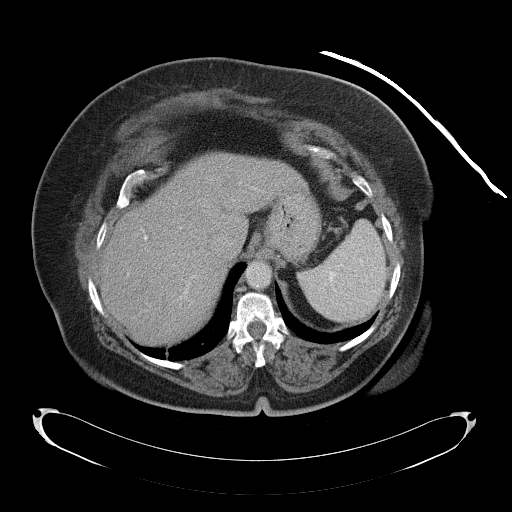
[im 73/86  soft-tissue]
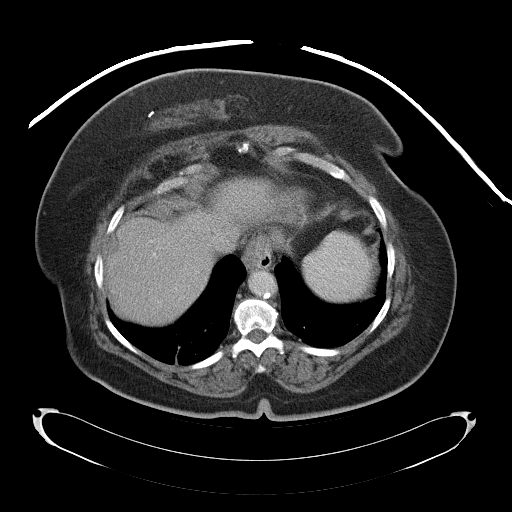
[im 81/86  soft-tissue]
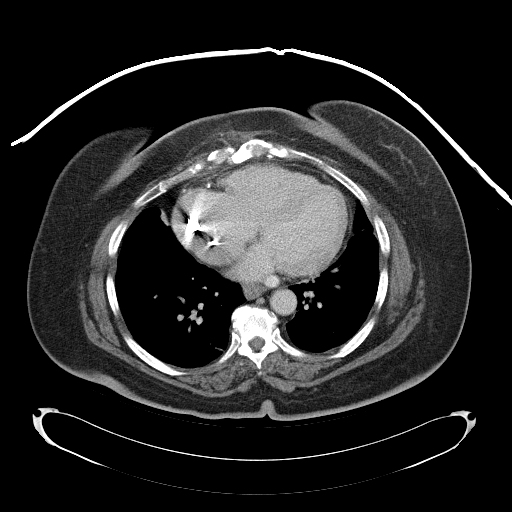

[Series 4: abd_pel_with 3.0 spo cor · coronal · 0.83mm/px · 3 of 94 slices shown]
[im 32/94  soft-tissue]
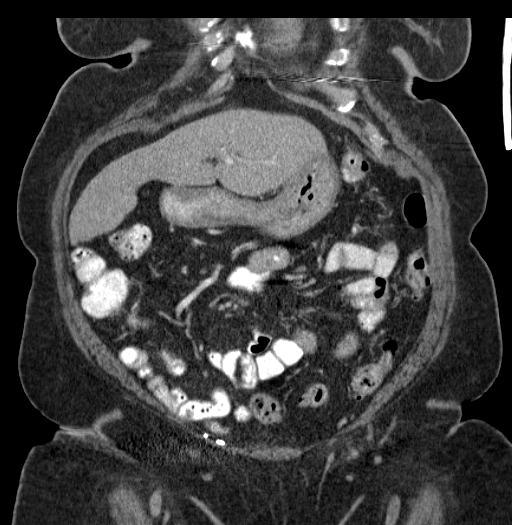
[im 42/94  soft-tissue]
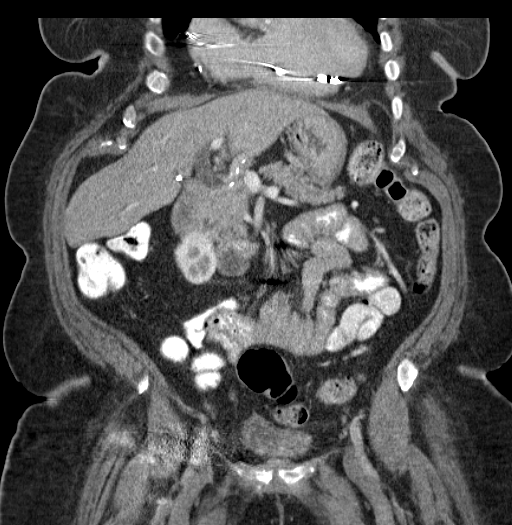
[im 52/94  soft-tissue]
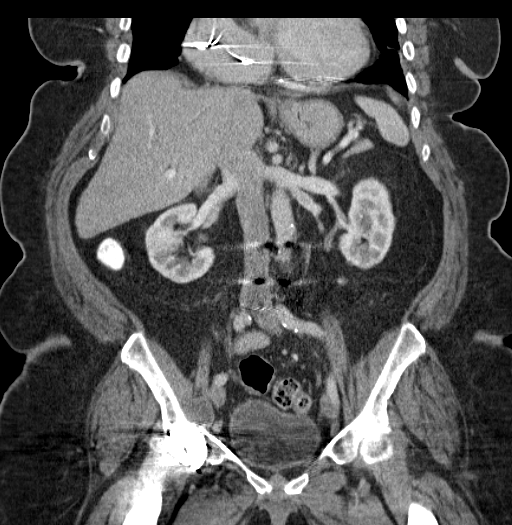

[17 of 46 positions shown; findings below may reference images not displayed]

FINDINGS: Interval mild linear atelectasis or scarring at the left
lung base.  Small hiatal hernia.  Interbody bone plug and pedicle
screw and rod fixation at the L3-L5 levels.  Grade 1
anterolisthesis at the L4-5 level without significant change since
10/18/2010.  Cholecystectomy clips.  Surgically absent uterus. The
ovaries are not identified.

Interval low density enlargement of the medial aspect of the right
iliopsoas muscle at the level of the superior acetabulum, measuring
2.2 x 2.2 cm in maximum dimensions on image number 67.

Unremarkable liver, spleen, pancreas, adrenal glands, kidneys and
urinary bladder.  Right total hip prosthesis with associated streak
artifacts.  No significant change in a small umbilical hernia
containing fat.  Midline surgical scar.
IMPRESSION: 1. Interval nonspecific low density enlargement of the medial
aspect of the iliopsoas muscle/tendon on the right at the level of
the superior acetabulum.  This could represent migration of fluid
related to the patient's hip prosthesis or reflect tenosynovitis.
2.  Stable small umbilical hernia containing fat.
3.  Overall mild linear atelectasis or scarring at the right lung
base.

## 2012-09-07 ENCOUNTER — Emergency Department (HOSPITAL_COMMUNITY): Payer: PRIVATE HEALTH INSURANCE

## 2012-09-07 ENCOUNTER — Encounter (HOSPITAL_COMMUNITY): Payer: Self-pay | Admitting: *Deleted

## 2012-09-07 ENCOUNTER — Inpatient Hospital Stay (HOSPITAL_COMMUNITY)
Admission: EM | Admit: 2012-09-07 | Discharge: 2012-09-13 | DRG: 392 | Disposition: A | Discharge status: Home Health | Payer: PRIVATE HEALTH INSURANCE | Attending: Pulmonary Disease | Admitting: Pulmonary Disease

## 2012-09-07 DIAGNOSIS — R112 Nausea with vomiting, unspecified: Secondary | ICD-10-CM

## 2012-09-07 DIAGNOSIS — I509 Heart failure, unspecified: Secondary | ICD-10-CM | POA: Diagnosis present

## 2012-09-07 DIAGNOSIS — K219 Gastro-esophageal reflux disease without esophagitis: Secondary | ICD-10-CM | POA: Diagnosis present

## 2012-09-07 DIAGNOSIS — I129 Hypertensive chronic kidney disease with stage 1 through stage 4 chronic kidney disease, or unspecified chronic kidney disease: Secondary | ICD-10-CM | POA: Diagnosis present

## 2012-09-07 DIAGNOSIS — G40909 Epilepsy, unspecified, not intractable, without status epilepticus: Secondary | ICD-10-CM | POA: Diagnosis present

## 2012-09-07 DIAGNOSIS — Z66 Do not resuscitate: Secondary | ICD-10-CM | POA: Diagnosis present

## 2012-09-07 DIAGNOSIS — K2960 Other gastritis without bleeding: Secondary | ICD-10-CM | POA: Diagnosis present

## 2012-09-07 DIAGNOSIS — N189 Chronic kidney disease, unspecified: Secondary | ICD-10-CM | POA: Diagnosis present

## 2012-09-07 DIAGNOSIS — Z6841 Body Mass Index (BMI) 40.0 and over, adult: Secondary | ICD-10-CM

## 2012-09-07 DIAGNOSIS — E1149 Type 2 diabetes mellitus with other diabetic neurological complication: Secondary | ICD-10-CM | POA: Diagnosis present

## 2012-09-07 DIAGNOSIS — Z96659 Presence of unspecified artificial knee joint: Secondary | ICD-10-CM

## 2012-09-07 DIAGNOSIS — Z86718 Personal history of other venous thrombosis and embolism: Secondary | ICD-10-CM

## 2012-09-07 DIAGNOSIS — J449 Chronic obstructive pulmonary disease, unspecified: Secondary | ICD-10-CM | POA: Diagnosis present

## 2012-09-07 DIAGNOSIS — Z87891 Personal history of nicotine dependence: Secondary | ICD-10-CM

## 2012-09-07 DIAGNOSIS — IMO0002 Reserved for concepts with insufficient information to code with codable children: Secondary | ICD-10-CM | POA: Diagnosis present

## 2012-09-07 DIAGNOSIS — Z90710 Acquired absence of both cervix and uterus: Secondary | ICD-10-CM

## 2012-09-07 DIAGNOSIS — I252 Old myocardial infarction: Secondary | ICD-10-CM

## 2012-09-07 DIAGNOSIS — E860 Dehydration: Secondary | ICD-10-CM | POA: Diagnosis present

## 2012-09-07 DIAGNOSIS — I2510 Atherosclerotic heart disease of native coronary artery without angina pectoris: Secondary | ICD-10-CM | POA: Diagnosis present

## 2012-09-07 DIAGNOSIS — M1710 Unilateral primary osteoarthritis, unspecified knee: Secondary | ICD-10-CM | POA: Diagnosis present

## 2012-09-07 DIAGNOSIS — I5032 Chronic diastolic (congestive) heart failure: Secondary | ICD-10-CM | POA: Diagnosis present

## 2012-09-07 DIAGNOSIS — Z7982 Long term (current) use of aspirin: Secondary | ICD-10-CM

## 2012-09-07 DIAGNOSIS — Z96649 Presence of unspecified artificial hip joint: Secondary | ICD-10-CM

## 2012-09-07 DIAGNOSIS — K449 Diaphragmatic hernia without obstruction or gangrene: Secondary | ICD-10-CM | POA: Diagnosis present

## 2012-09-07 DIAGNOSIS — G4730 Sleep apnea, unspecified: Secondary | ICD-10-CM | POA: Diagnosis present

## 2012-09-07 DIAGNOSIS — E876 Hypokalemia: Secondary | ICD-10-CM | POA: Diagnosis present

## 2012-09-07 DIAGNOSIS — E785 Hyperlipidemia, unspecified: Secondary | ICD-10-CM | POA: Diagnosis present

## 2012-09-07 DIAGNOSIS — R079 Chest pain, unspecified: Secondary | ICD-10-CM

## 2012-09-07 DIAGNOSIS — J4489 Other specified chronic obstructive pulmonary disease: Secondary | ICD-10-CM | POA: Diagnosis present

## 2012-09-07 DIAGNOSIS — K222 Esophageal obstruction: Secondary | ICD-10-CM | POA: Diagnosis present

## 2012-09-07 DIAGNOSIS — Z950 Presence of cardiac pacemaker: Secondary | ICD-10-CM

## 2012-09-07 DIAGNOSIS — D649 Anemia, unspecified: Secondary | ICD-10-CM | POA: Diagnosis present

## 2012-09-07 DIAGNOSIS — K3184 Gastroparesis: Secondary | ICD-10-CM | POA: Diagnosis present

## 2012-09-07 DIAGNOSIS — Z951 Presence of aortocoronary bypass graft: Secondary | ICD-10-CM

## 2012-09-07 DIAGNOSIS — Z79899 Other long term (current) drug therapy: Secondary | ICD-10-CM

## 2012-09-07 HISTORY — DX: Unspecified convulsions: R56.9

## 2012-09-07 LAB — CBC WITH DIFFERENTIAL/PLATELET
Basophils Absolute: 0 10*3/uL (ref 0.0–0.1)
Basophils Relative: 0 % (ref 0–1)
Lymphocytes Relative: 17 % (ref 12–46)
Neutro Abs: 6.9 10*3/uL (ref 1.7–7.7)
Neutrophils Relative %: 74 % (ref 43–77)
Platelets: 244 10*3/uL (ref 150–400)
RDW: 13.9 % (ref 11.5–15.5)
WBC: 9.3 10*3/uL (ref 4.0–10.5)

## 2012-09-07 LAB — COMPREHENSIVE METABOLIC PANEL
ALT: 11 U/L (ref 0–35)
AST: 16 U/L (ref 0–37)
Albumin: 3.8 g/dL (ref 3.5–5.2)
CO2: 29 mEq/L (ref 19–32)
Chloride: 99 mEq/L (ref 96–112)
GFR calc non Af Amer: 51 mL/min — ABNORMAL LOW (ref >90)
Sodium: 141 mEq/L (ref 135–145)
Total Bilirubin: 0.2 mg/dL — ABNORMAL LOW (ref 0.3–1.2)

## 2012-09-07 LAB — GLUCOSE, CAPILLARY: Glucose-Capillary: 145 mg/dL — ABNORMAL HIGH (ref 70–99)

## 2012-09-07 LAB — URINALYSIS, ROUTINE W REFLEX MICROSCOPIC
Bilirubin Urine: NEGATIVE
Glucose, UA: NEGATIVE mg/dL
Hgb urine dipstick: NEGATIVE
Specific Gravity, Urine: 1.01 (ref 1.005–1.030)
Urobilinogen, UA: 0.2 mg/dL (ref 0.0–1.0)

## 2012-09-07 MED ORDER — GABAPENTIN 300 MG PO CAPS
300.0000 mg | ORAL_CAPSULE | Freq: Three times a day (TID) | ORAL | Status: DC
  Administered 2012-09-07 – 2012-09-13 (×17): 300 mg via ORAL
  Filled 2012-09-07 (×17): qty 1

## 2012-09-07 MED ORDER — SODIUM CHLORIDE 0.9 % IV SOLN
INTRAVENOUS | Status: DC
  Administered 2012-09-08: 10:00:00 via INTRAVENOUS

## 2012-09-07 MED ORDER — ONDANSETRON HCL 4 MG/2ML IJ SOLN: 4.0000 mg | Freq: Three times a day (TID) | INTRAMUSCULAR | Status: AC | PRN

## 2012-09-07 MED ORDER — HYDRALAZINE HCL 25 MG PO TABS
25.0000 mg | ORAL_TABLET | Freq: Three times a day (TID) | ORAL | Status: DC
  Administered 2012-09-07 – 2012-09-11 (×10): 25 mg via ORAL
  Filled 2012-09-07 (×10): qty 1

## 2012-09-07 MED ORDER — ONDANSETRON HCL 4 MG PO TABS
4.0000 mg | ORAL_TABLET | Freq: Four times a day (QID) | ORAL | Status: DC | PRN
  Administered 2012-09-13: 4 mg via ORAL
  Filled 2012-09-07: qty 1

## 2012-09-07 MED ORDER — BENAZEPRIL HCL 10 MG PO TABS
20.0000 mg | ORAL_TABLET | Freq: Two times a day (BID) | ORAL | Status: DC
  Administered 2012-09-07 – 2012-09-13 (×12): 20 mg via ORAL
  Filled 2012-09-07 (×12): qty 2

## 2012-09-07 MED ORDER — ATORVASTATIN CALCIUM 40 MG PO TABS
40.0000 mg | ORAL_TABLET | Freq: Every day | ORAL | Status: DC
  Administered 2012-09-07 – 2012-09-12 (×6): 40 mg via ORAL
  Filled 2012-09-07 (×6): qty 1

## 2012-09-07 MED ORDER — METOPROLOL TARTRATE 25 MG PO TABS
12.5000 mg | ORAL_TABLET | Freq: Two times a day (BID) | ORAL | Status: DC
  Administered 2012-09-07 – 2012-09-09 (×4): 12.5 mg via ORAL
  Administered 2012-09-09: 25 mg via ORAL
  Administered 2012-09-10 – 2012-09-13 (×7): 12.5 mg via ORAL
  Filled 2012-09-07 (×12): qty 1

## 2012-09-07 MED ORDER — POTASSIUM CHLORIDE 10 MEQ/100ML IV SOLN
10.0000 meq | INTRAVENOUS | Status: AC
  Administered 2012-09-07 (×6): 10 meq via INTRAVENOUS
  Filled 2012-09-07: qty 500
  Filled 2012-09-07: qty 100

## 2012-09-07 MED ORDER — METFORMIN HCL 500 MG PO TABS: 500.0000 mg | ORAL_TABLET | Freq: Two times a day (BID) | ORAL | Status: DC

## 2012-09-07 MED ORDER — ALUM & MAG HYDROXIDE-SIMETH 200-200-20 MG/5ML PO SUSP: 30.0000 mL | Freq: Four times a day (QID) | ORAL | Status: DC | PRN

## 2012-09-07 MED ORDER — POTASSIUM CHLORIDE CRYS ER 20 MEQ PO TBCR
40.0000 meq | EXTENDED_RELEASE_TABLET | Freq: Once | ORAL | Status: AC
  Administered 2012-09-07: 40 meq via ORAL
  Filled 2012-09-07: qty 2

## 2012-09-07 MED ORDER — INSULIN ASPART 100 UNIT/ML ~~LOC~~ SOLN
0.0000 [IU] | Freq: Three times a day (TID) | SUBCUTANEOUS | Status: DC
  Administered 2012-09-07: 3 [IU] via SUBCUTANEOUS
  Administered 2012-09-08 (×2): 4 [IU] via SUBCUTANEOUS
  Administered 2012-09-09: 3 [IU] via SUBCUTANEOUS
  Administered 2012-09-09: 4 [IU] via SUBCUTANEOUS
  Administered 2012-09-09 – 2012-09-12 (×4): 3 [IU] via SUBCUTANEOUS

## 2012-09-07 MED ORDER — NITROGLYCERIN 0.4 MG SL SUBL: 0.4000 mg | SUBLINGUAL_TABLET | SUBLINGUAL | Status: DC | PRN

## 2012-09-07 MED ORDER — METOCLOPRAMIDE HCL 10 MG PO TABS
10.0000 mg | ORAL_TABLET | Freq: Four times a day (QID) | ORAL | Status: DC
  Administered 2012-09-07 – 2012-09-08 (×4): 10 mg via ORAL
  Filled 2012-09-07 (×4): qty 1

## 2012-09-07 MED ORDER — ENOXAPARIN SODIUM 40 MG/0.4ML ~~LOC~~ SOLN
40.0000 mg | SUBCUTANEOUS | Status: AC
  Administered 2012-09-07 – 2012-09-08 (×2): 40 mg via SUBCUTANEOUS
  Filled 2012-09-07 (×2): qty 0.4

## 2012-09-07 MED ORDER — SODIUM CHLORIDE 0.9 % IV SOLN
INTRAVENOUS | Status: AC
  Administered 2012-09-07: 100 mL/h via INTRAVENOUS

## 2012-09-07 MED ORDER — ONDANSETRON HCL 4 MG/2ML IJ SOLN
4.0000 mg | Freq: Four times a day (QID) | INTRAMUSCULAR | Status: DC | PRN
  Administered 2012-09-08 (×3): 4 mg via INTRAVENOUS
  Filled 2012-09-07 (×3): qty 2

## 2012-09-07 MED ORDER — ACETAMINOPHEN 650 MG RE SUPP: 650.0000 mg | Freq: Four times a day (QID) | RECTAL | Status: DC | PRN

## 2012-09-07 MED ORDER — INSULIN ASPART 100 UNIT/ML ~~LOC~~ SOLN: 0.0000 [IU] | Freq: Every day | SUBCUTANEOUS | Status: DC

## 2012-09-07 MED ORDER — SODIUM CHLORIDE 0.9 % IV BOLUS (SEPSIS)
500.0000 mL | Freq: Once | INTRAVENOUS | Status: AC
  Administered 2012-09-07: 1000 mL via INTRAVENOUS

## 2012-09-07 MED ORDER — SODIUM CHLORIDE 0.9 % IV SOLN: INTRAVENOUS | Status: DC

## 2012-09-07 MED ORDER — ACETAMINOPHEN 325 MG PO TABS
650.0000 mg | ORAL_TABLET | Freq: Four times a day (QID) | ORAL | Status: DC | PRN
  Administered 2012-09-08 – 2012-09-10 (×2): 650 mg via ORAL
  Filled 2012-09-07 (×2): qty 2

## 2012-09-07 MED ORDER — POTASSIUM CHLORIDE 10 MEQ/100ML IV SOLN
10.0000 meq | Freq: Once | INTRAVENOUS | Status: AC
Start: 2012-09-07 — End: 2012-09-07
  Administered 2012-09-07: 10 meq via INTRAVENOUS
  Filled 2012-09-07: qty 100

## 2012-09-07 MED ORDER — AMLODIPINE BESYLATE 5 MG PO TABS
10.0000 mg | ORAL_TABLET | Freq: Every day | ORAL | Status: DC
  Administered 2012-09-07 – 2012-09-13 (×7): 10 mg via ORAL
  Filled 2012-09-07 (×7): qty 2

## 2012-09-07 MED ORDER — PANTOPRAZOLE SODIUM 40 MG IV SOLR
40.0000 mg | INTRAVENOUS | Status: DC
  Administered 2012-09-07 – 2012-09-09 (×3): 40 mg via INTRAVENOUS
  Filled 2012-09-07 (×3): qty 40

## 2012-09-07 MED ORDER — PHENYTOIN SODIUM EXTENDED 100 MG PO CAPS
300.0000 mg | ORAL_CAPSULE | Freq: Every day | ORAL | Status: DC
  Administered 2012-09-07 – 2012-09-12 (×6): 300 mg via ORAL
  Filled 2012-09-07 (×6): qty 3

## 2012-09-07 MED ORDER — DOXEPIN HCL 25 MG PO CAPS
25.0000 mg | ORAL_CAPSULE | Freq: Every day | ORAL | Status: DC
  Administered 2012-09-07 – 2012-09-12 (×6): 25 mg via ORAL
  Filled 2012-09-07 (×6): qty 1

## 2012-09-07 NOTE — ED Provider Notes (Signed)
History  This chart was scribed for [name], MD by [name]. This patient was seen in room APA19/APA19 and the patient's care was started at 12:51PM.  CSN: [id number]  Arrival date & time 09/07/12  1112   First MD Initiated Contact with Patient 09/07/12 1251      Chief Complaint  Patient presents with  . Abdominal Pain    The history is provided by the patient. No language interpreter was used.    [name] is a 71 y.o. female who presents to the Emergency Department complaining of 2 to 3 days of intermittent back pain described as a soreness with associated leg weakness. She denies taking OTC medications at home to improve symptoms. She states that she has experienced prior episodes diagnosed as back spasms before. She also c/o 5 episodes of emesis described as yellow and diarrhea described as loose green stool once a day for the past 3 weeks. She states that she has called her PCP to discuss the symptoms, but he has not called her back. She also c/o a a non-productive cough and HA after a fall on the 9/24. She reports head trauma from the fall but has a spotty memory concerning recent events and is unable to provided further detail on the incident. She denies fever, nausea, emesis, HA and rash as associated symptoms. She has a h/o DM, HTN, GERD, HLD and seizure disorder. She is a former smoker but denies alcohol use.   Dr. Juanetta Gosling is PCP.  She reports that she has an Geophysicist/field seismologist to help with everyday activities.  Past Medical History  Diagnosis Date  . Diabetes mellitus   . HTN (hypertension)   . Seizure disorder   . CAD (coronary artery disease)     multivessel, CABG 6/10.  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  Myoview (11/20/11) showed no ischemia & EF 62%  . GERD (gastroesophageal reflux disease)   . Hyperlipidemia   . DVT (deep venous thrombosis)     R arm venous occlusion s/p prior PPM  . Obesity   . DDD (degenerative disc disease)   . Complete heart block 1993    s/p PPM,  most recent generator change 2003  . Dehiscence of closure of sternum or sternotomy 8/10    required flap  . COPD (chronic obstructive pulmonary disease)     severe lung disease by PFTs 6/12  . Asthma   . Sleep apnea   . Wound infection     Sternal  . H/O: hysterectomy   . Chronic kidney disease   . Seizures     Past Surgical History  Procedure Date  . Pacemaker placement 1993    Status post DDD pacemaker implantation in 1993, with [employer] Kappa generator change in 2003 for  second-degree AV block, most recent gen change by Fawn Kirk 04/14/11  . Revision total hip arthroplasty   . Replacement total knee   . Thyroid surgery   . Wrist surgery   . Coronary artery bypass graft     - LIMA to LAD, SVG to OM, SVG to PDA  . Reconstructive repair sternal     for infection s/p CABG 8/10  . Back surgery   . Esophagogastroduodenoscopy     with [facility] dilation,  biopsy, and disruption Schatzki's ring  . Colonoscopy 04/27/2005  . Esophagogastroduodenoscopy 09/10/2012    Procedure: ESOPHAGOGASTRODUODENOSCOPY (EGD);  Surgeon: Corbin Ade, MD;  Location: [facility];  Service: Endoscopy;  Laterality: Left;    Family History  Problem  Relation Age of Onset  . Diabetes    . Heart disease Mother     MI  . Coronary artery disease      Female <55  . Cancer      History  Substance Use Topics  . Smoking status: Former Games developer  . Smokeless tobacco: Never Used  . Alcohol Use: No    No OB history provided.  Review of Systems  A complete 10 system review of systems was obtained and all systems are negative except as noted in the HPI and PMH.    Allergies  Codeine; Other; and Oxycodone hcl  Home Medications   Current Outpatient Rx  Name Route Sig Dispense Refill  . AMLODIPINE BESYLATE 10 MG PO TABS Oral Take 10 mg by mouth daily.    . ATORVASTATIN CALCIUM 40 MG PO TABS Oral Take 1 tablet (40 mg total) by mouth daily at 6 PM. 30 tablet 6  . BENAZEPRIL HCL 20 MG PO TABS Oral Take 20 mg  by mouth 2 (two) times daily.    Marland Kitchen BROMFENAC SODIUM (ONCE-DAILY) 0.09 % OP SOLN Right Eye Place 1 drop into the right eye 3 (three) times daily.    Marland Kitchen DOXEPIN HCL 25 MG PO CAPS Oral Take 25 mg by mouth at bedtime.    Marland Kitchen ESTRADIOL 0.5 MG PO TABS Oral Take 0.5 mg by mouth daily.      . OMEGA-3 FATTY ACIDS 1000 MG PO CAPS Oral Take 1 g by mouth 2 (two) times daily.     . FUROSEMIDE 40 MG PO TABS Oral Take 2 tablets (80 mg total) by mouth 2 (two) times daily. 120 tablet 3  . GABAPENTIN 300 MG PO CAPS Oral Take 300 mg by mouth 3 (three) times daily.    Marland Kitchen HYDRALAZINE HCL 25 MG PO TABS Oral Take 1 tablet (25 mg total) by mouth every 8 (eight) hours. 90 tablet 6  . METFORMIN HCL 500 MG PO TABS Oral Take 500 mg by mouth 2 (two) times daily with a meal.     . METOPROLOL TARTRATE 25 MG PO TABS Oral Take 0.5 tablets (12.5 mg total) by mouth 2 (two) times daily. 30 tablet 6  . ONDANSETRON 8 MG PO TBDP Oral Take 8 mg by mouth every 8 (eight) hours as needed. Nausea and Vomiting    . PHENYTOIN SODIUM EXTENDED 100 MG PO CAPS Oral Take 300 mg by mouth at bedtime.      Marland Kitchen POTASSIUM CHLORIDE 10 MEQ PO TBCR Oral Take 10 mEq by mouth daily.    Bernadette Hoit SODIUM 8.6-50 MG PO TABS Oral Take 1 tablet by mouth daily as needed.    . ASPIRIN EC 81 MG PO TBEC Oral Take 81 mg by mouth daily.    Marland Kitchen DIPHENOXYLATE-ATROPINE 2.5-0.025 MG PO TABS Oral Take 1 tablet by mouth 4 (four) times daily as needed for diarrhea or loose stools. 20 tablet 0  . NITROGLYCERIN 0.4 MG SL SUBL Sublingual Place 1 tablet (0.4 mg total) under the tongue every 5 (five) minutes as needed for chest pain (Up to 3 doses). 25 tablet 3  . PROMETHAZINE HCL 25 MG PO TABS Oral Take 1 tablet (25 mg total) by mouth every 6 (six) hours as needed for nausea. 15 tablet 0    Triage Vitals: BP 186/79  Pulse 72  Temp 98 F (36.7 C) (Oral)  Resp 16  Ht 5\' 2"  (1.575 m)  Wt 240 lb (108.863 kg)  BMI 43.90 kg/m2  SpO2 100%  Physical Exam  Nursing note and  vitals reviewed. Constitutional: She is oriented to person, place, and time. She appears well-developed and well-nourished.  HENT:  Head: Normocephalic and atraumatic.  Mouth/Throat: Oropharynx is clear and moist.       Diffuse scalp tenderness  Eyes: Conjunctivae normal and EOM are normal. Pupils are equal, round, and reactive to light.  Neck: Normal range of motion and phonation normal. Neck supple.  Cardiovascular: Normal rate, regular rhythm and intact distal pulses.   Pulmonary/Chest: Effort normal and breath sounds normal. She exhibits no tenderness.  Abdominal: Soft. She exhibits no distension. There is tenderness (epigastric tenderness). There is no guarding.  Musculoskeletal: Normal range of motion. She exhibits edema (2+ edemai bilaterally).  Neurological: She is alert and oriented to person, place, and time. She has normal strength. She exhibits normal muscle tone.  Skin: Skin is warm and dry.  Psychiatric: She has a normal mood and affect. Her behavior is normal. Judgment and thought content normal.    ED Course  Procedures (including critical care time)  DIAGNOSTIC STUDIES: Oxygen Saturation is 100% on room air, normal by my interpretation.    COORDINATION OF CARE: 12:59PM-Discussed treatment plan with pt at bedside and pt agreed to plan.  1:03PM-Ordered a CBC, metabolic panel, UA, CXR and urine culture.  1:15PM-Ordered 500 mL of Bolus.  2:30PM-Ordered oral 40 mEq potassium and 100 mL potassium. Will work on getting the pt admitted.   2:45PM-Consult complete with Dr. Ouida Sills. Patient case explained and discussed. Dr. Ouida Sills agrees to admit patient for further overnight evaluation and treatment. Call ended at 2:47PM.  Labs Reviewed  CBC WITH DIFFERENTIAL - Abnormal; Notable for the following:    RBC 3.32 (*)     Hemoglobin 9.2 (*)     HCT 28.5 (*)     All other components within normal limits  COMPREHENSIVE METABOLIC PANEL - Abnormal; Notable for the following:     Potassium 2.7 (*)     Glucose, Bld 103 (*)     Total Bilirubin 0.2 (*)     GFR calc non Af Amer 51 (*)     GFR calc Af Amer 60 (*)     All other components within normal limits  BASIC METABOLIC PANEL - Abnormal; Notable for the following:    Potassium 3.3 (*)  DELTA CHECK NOTED   Glucose, Bld 128 (*)     BUN 5 (*)     GFR calc non Af Amer 68 (*)     GFR calc Af Amer 79 (*)     All other components within normal limits  PHENYTOIN LEVEL, TOTAL - Abnormal; Notable for the following:    Phenytoin Lvl 2.8 (*)     All other components within normal limits  HEMOGLOBIN A1C - Abnormal; Notable for the following:    Hemoglobin A1C 6.1 (*)     Mean Plasma Glucose 128 (*)     All other components within normal limits  GLUCOSE, CAPILLARY - Abnormal; Notable for the following:    Glucose-Capillary 145 (*)     All other components within normal limits  GLUCOSE, CAPILLARY - Abnormal; Notable for the following:    Glucose-Capillary 128 (*)     All other components within normal limits  GLUCOSE, CAPILLARY - Abnormal; Notable for the following:    Glucose-Capillary 156 (*)     All other components within normal limits  GLUCOSE, CAPILLARY - Abnormal; Notable for the following:    Glucose-Capillary 110 (*)  All other components within normal limits  GLUCOSE, CAPILLARY - Abnormal; Notable for the following:    Glucose-Capillary 156 (*)     All other components within normal limits  BASIC METABOLIC PANEL - Abnormal; Notable for the following:    Glucose, Bld 131 (*)     GFR calc non Af Amer 55 (*)     GFR calc Af Amer 63 (*)     All other components within normal limits  CBC WITH DIFFERENTIAL - Abnormal; Notable for the following:    WBC 16.9 (*)     RBC 3.58 (*)     Hemoglobin 9.9 (*)     HCT 31.1 (*)     Neutrophils Relative 85 (*)     Neutro Abs 14.4 (*)     Lymphocytes Relative 7 (*)     Monocytes Absolute 1.3 (*)     All other components within normal limits  GLUCOSE, CAPILLARY -  Abnormal; Notable for the following:    Glucose-Capillary 140 (*)     All other components within normal limits  GLUCOSE, CAPILLARY - Abnormal; Notable for the following:    Glucose-Capillary 164 (*)     All other components within normal limits  GLUCOSE, CAPILLARY - Abnormal; Notable for the following:    Glucose-Capillary 122 (*)     All other components within normal limits  GLUCOSE, CAPILLARY - Abnormal; Notable for the following:    Glucose-Capillary 139 (*)     All other components within normal limits  GLUCOSE, CAPILLARY - Abnormal; Notable for the following:    Glucose-Capillary 127 (*)     All other components within normal limits  GLUCOSE, CAPILLARY - Abnormal; Notable for the following:    Glucose-Capillary 130 (*)     All other components within normal limits  GLUCOSE, CAPILLARY - Abnormal; Notable for the following:    Glucose-Capillary 110 (*)     All other components within normal limits  GLUCOSE, CAPILLARY - Abnormal; Notable for the following:    Glucose-Capillary 119 (*)     All other components within normal limits  GLUCOSE, CAPILLARY - Abnormal; Notable for the following:    Glucose-Capillary 101 (*)     All other components within normal limits  GLUCOSE, CAPILLARY - Abnormal; Notable for the following:    Glucose-Capillary 120 (*)     All other components within normal limits  GLUCOSE, CAPILLARY - Abnormal; Notable for the following:    Glucose-Capillary 114 (*)     All other components within normal limits  GLUCOSE, CAPILLARY - Abnormal; Notable for the following:    Glucose-Capillary 118 (*)     All other components within normal limits  GLUCOSE, CAPILLARY - Abnormal; Notable for the following:    Glucose-Capillary 123 (*)     All other components within normal limits  GLUCOSE, CAPILLARY - Abnormal; Notable for the following:    Glucose-Capillary 123 (*)     All other components within normal limits  CBC WITH DIFFERENTIAL - Abnormal; Notable for the  following:    WBC 13.7 (*)     Neutro Abs 9.8 (*)     Monocytes Absolute 1.2 (*)     All other components within normal limits  BASIC METABOLIC PANEL - Abnormal; Notable for the following:    Potassium 2.5 (*)     Glucose, Bld 134 (*)     Creatinine, Ser 1.21 (*)     GFR calc non Af Amer 43 (*)     GFR  calc Af Amer 50 (*)     All other components within normal limits  GLUCOSE, CAPILLARY - Abnormal; Notable for the following:    Glucose-Capillary 116 (*)     All other components within normal limits  GLUCOSE, CAPILLARY - Abnormal; Notable for the following:    Glucose-Capillary 141 (*)     All other components within normal limits  GLUCOSE, CAPILLARY - Abnormal; Notable for the following:    Glucose-Capillary 107 (*)     All other components within normal limits  GLUCOSE, CAPILLARY - Abnormal; Notable for the following:    Glucose-Capillary 116 (*)     All other components within normal limits  BASIC METABOLIC PANEL - Abnormal; Notable for the following:    CO2 17 (*)     Glucose, Bld 121 (*)     GFR calc non Af Amer 51 (*)     GFR calc Af Amer 60 (*)     All other components within normal limits  GLUCOSE, CAPILLARY - Abnormal; Notable for the following:    Glucose-Capillary 109 (*)     All other components within normal limits  URINALYSIS, ROUTINE W REFLEX MICROSCOPIC  URINE CULTURE  CLOSTRIDIUM DIFFICILE BY PCR  SURGICAL PATHOLOGY  GLUCOSE, CAPILLARY  LAB REPORT - SCANNED   Ct Abdomen Pelvis W Contrast  09/16/2012  *RADIOLOGY REPORT*  Clinical Data: Abdominal pain.  Nausea.  Vomiting.  Diarrhea.  CT ABDOMEN AND PELVIS WITH CONTRAST  Technique:  Multidetector CT imaging of the abdomen and pelvis was performed following the standard protocol during bolus administration of intravenous contrast.  Contrast: OMNIPAQUE IOHEXOL 300 MG/ML  SOLN  Comparison: 03/08/2011.  Findings: Lung Bases: Partially visualized pacemaker leads. Coronary artery atherosclerosis is present. If  office based assessment of coronary risk factors has not been performed, it is now recommended.  Lingular atelectasis or scarring.  Bilateral lower lobe atelectasis.  Lower thoracic aortic atherosclerosis.  Liver:  Normal.  Spleen:  Normal.  Gallbladder:  cholecystectomy.  Common bile duct:  Post cholecystectomy dilation of the common bile duct.  Pancreas:  Normal.  Adrenal glands:  Mild adrenal hyperplasia.  Kidneys:  Normal enhancement.  Normal delayed excretion of contrast.  Ureters appear within normal limits.  Stomach:  Normal.  Small bowel:  Normal duodenum.  No small bowel obstruction.  Fat containing periumbilical hernia.  Scar in the right lower quadrant abdominal wall.  No dilated small bowel. There is some fecalization of small bowel in the right lower quadrant suggesting stasis.  Mild stranding of the fat around the ileum.  Findings suspicious for enteritis but nonspecific.  This is a new finding compared to the prior exam.  Colon:   Appendix not identified.  No right lower quadrant inflammatory changes.  Colonic diverticulosis without diverticulitis.  Pelvic Genitourinary:  No free fluid.  Urinary bladder appears within normal limits.  Scatter artifact from the right hip arthroplasty.  Bones:  No aggressive osseous lesions.  Right total hip arthroplasty.  Bilateral SI joint degenerative disease. Postoperative changes of posterior lumbar interbody fusion from L3- L5.  Severe lumbar spondylosis at other levels.  Severe left hip osteoarthritis.  Vasculature: Thoracic, abdominal and iliofemoral atherosclerosis. No aneurysm.  IMPRESSION:  1. Mild stranding around small bowel the right lower quadrant without evidence of obstruction.  Findings suggestive of enteritis although nonspecific.  Differential considerations include infection, inflammatory bowel disease or ischemia.  The superior mesenteric artery appears adequately patent. 2.  Fat containing periumbilical hernia. 3.  Cholecystectomy.  Original  Report Authenticated By: Andreas Newport, M.D.      1. Nausea & vomiting   2. Hypokalemia   3. CHEST PAIN   4. Gastroparesis   5. Dehydration       MDM  N/V with secondary Hypokalemia, and dehydration. Nonspecific chest pain. Suspect gastroparesis as complicating her findings.    Plan: Admit- PCP   I personally performed the services described in this documentation, which was scribed in my presence. The recorded information has been reviewed and considered.         [name], MD 09/18/12 [phone]

## 2012-09-07 NOTE — ED Notes (Signed)
CRITICAL VALUE ALERT  Critical value received:  K-2.7  Date of notification:  09/07/12  Time of notification:  1424  Critical value read back:yes  Nurse who received alert:  t abbott rn  MD notified (1st page):  wentz  Time of first page:  1424  MD notified (2nd page):  Time of second page:  Responding MD:  Effie Shy  Time MD responded:  [phone]

## 2012-09-07 NOTE — ED Notes (Signed)
abd pain and back pain "spasms" from waist down,  N/v for 3 weeks, Had diarrhea yesterday.  ?fever.

## 2012-09-07 NOTE — ED Notes (Signed)
Patient having spasms

## 2012-09-08 DIAGNOSIS — R112 Nausea with vomiting, unspecified: Secondary | ICD-10-CM

## 2012-09-08 DIAGNOSIS — E860 Dehydration: Secondary | ICD-10-CM | POA: Diagnosis present

## 2012-09-08 DIAGNOSIS — E876 Hypokalemia: Secondary | ICD-10-CM | POA: Diagnosis present

## 2012-09-08 LAB — HEMOGLOBIN A1C: Mean Plasma Glucose: 128 mg/dL — ABNORMAL HIGH (ref <117)

## 2012-09-08 LAB — GLUCOSE, CAPILLARY
Glucose-Capillary: 128 mg/dL — ABNORMAL HIGH (ref 70–99)
Glucose-Capillary: 110 mg/dL — ABNORMAL HIGH (ref 70–99)

## 2012-09-08 LAB — BASIC METABOLIC PANEL
BUN: 5 mg/dL — ABNORMAL LOW (ref 6–23)
CO2: 28 mEq/L (ref 19–32)
Chloride: 104 mEq/L (ref 96–112)
Creatinine, Ser: 0.83 mg/dL (ref 0.50–1.10)
Glucose, Bld: 128 mg/dL — ABNORMAL HIGH (ref 70–99)
Potassium: 3.3 mEq/L — ABNORMAL LOW (ref 3.5–5.1)

## 2012-09-08 MED ORDER — PROMETHAZINE HCL 25 MG/ML IJ SOLN
12.5000 mg | INTRAMUSCULAR | Status: DC | PRN
  Administered 2012-09-10: 12.5 mg via INTRAVENOUS
  Filled 2012-09-08: qty 1

## 2012-09-08 MED ORDER — METOCLOPRAMIDE HCL 5 MG/ML IJ SOLN
10.0000 mg | Freq: Four times a day (QID) | INTRAMUSCULAR | Status: DC
  Administered 2012-09-08 – 2012-09-10 (×7): 10 mg via INTRAVENOUS
  Filled 2012-09-08 (×7): qty 2

## 2012-09-08 MED ORDER — POTASSIUM CHLORIDE IN NACL 20-0.9 MEQ/L-% IV SOLN
INTRAVENOUS | Status: DC
  Administered 2012-09-08 – 2012-09-10 (×4): via INTRAVENOUS

## 2012-09-08 NOTE — Progress Notes (Signed)
Dr. Juanetta Gosling notified of vomiting, new orders given.

## 2012-09-08 NOTE — Consult Note (Signed)
Subjective: She was admitted with nausea and vomiting and hypokalemia. She has apparently been off her Reglan for reasons that are not clear.  Objective: Vital signs in last 24 hours: Temp:  [98 F (36.7 C)-99.6 F (37.6 C)] 98.2 F (36.8 C) (10/05 0357) Pulse Rate:  [62-75] 62  (10/05 1007) Resp:  [16-20] 20  (10/05 0357) BP: (139-206)/(56-100) 180/95 mmHg (10/05 1003) SpO2:  [96 %-100 %] 97 % (10/05 0357) Weight:  [104.327 kg (230 lb)-113.399 kg (250 lb)] 104.327 kg (230 lb) (10/05 0113) Weight change:  Last BM Date: 09/06/12  Intake/Output from previous day: 10/04 0701 - 10/05 0700 In: 1536.3 [P.O.:240; I.V.:786.3; IV Piggyback:510] Out: 2500 [Urine:2500]  PHYSICAL EXAM General appearance: alert, cooperative, mild distress and morbidly obese Resp: clear to auscultation bilaterally Cardio: regular rate and rhythm, S1, S2 normal, no murmur, click, rub or gallop GI: Mildly diffusely tender Extremities: extremities normal, atraumatic, no cyanosis or edema  Lab Results:    Basic Metabolic Panel:  Basename 09/08/12 0655 09/07/12 1322  NA 143 141  K 3.3* 2.7*  CL 104 99  CO2 28 29  GLUCOSE 128* 103*  BUN 5* 9  CREATININE 0.83 1.05  CALCIUM 9.1 9.3  MG -- --  PHOS -- --   Liver Function Tests:  Basename 09/07/12 1322  AST 16  ALT 11  ALKPHOS 78  BILITOT 0.2*  PROT 7.5  ALBUMIN 3.8   No results found for this basename: LIPASE:2,AMYLASE:2 in the last 72 hours No results found for this basename: AMMONIA:2 in the last 72 hours CBC:  Basename 09/07/12 1322  WBC 9.3  NEUTROABS 6.9  HGB 9.2*  HCT 28.5*  MCV 85.8  PLT 244   Cardiac Enzymes: No results found for this basename: CKTOTAL:3,CKMB:3,CKMBINDEX:3,TROPONINI:3 in the last 72 hours BNP: No results found for this basename: PROBNP:3 in the last 72 hours D-Dimer: No results found for this basename: DDIMER:2 in the last 72 hours CBG:  Basename 09/08/12 0838 09/07/12 2037 09/07/12 1820  GLUCAP 156*  128* 145*   Hemoglobin A1C:  Basename 09/07/12 1322  HGBA1C 6.1*   Fasting Lipid Panel: No results found for this basename: CHOL,HDL,LDLCALC,TRIG,CHOLHDL,LDLDIRECT in the last 72 hours Thyroid Function Tests: No results found for this basename: TSH,T4TOTAL,FREET4,T3FREE,THYROIDAB in the last 72 hours Anemia Panel: No results found for this basename: VITAMINB12,FOLATE,FERRITIN,TIBC,IRON,RETICCTPCT in the last 72 hours Coagulation: No results found for this basename: LABPROT:2,INR:2 in the last 72 hours Urine Drug Screen: Drugs of Abuse  No results found for this basename: labopia, cocainscrnur, labbenz, amphetmu, thcu, labbarb    Alcohol Level: No results found for this basename: ETH:2 in the last 72 hours Urinalysis:  Basename 09/07/12 1312  COLORURINE YELLOW  LABSPEC 1.010  PHURINE 7.5  GLUCOSEU NEGATIVE  HGBUR NEGATIVE  BILIRUBINUR NEGATIVE  KETONESUR NEGATIVE  PROTEINUR NEGATIVE  UROBILINOGEN 0.2  NITRITE NEGATIVE  LEUKOCYTESUR NEGATIVE   Misc. Labs:  ABGS No results found for this basename: PHART,PCO2,PO2ART,TCO2,HCO3 in the last 72 hours CULTURES No results found for this or any previous visit (from the past 240 hour(s)). Studies/Results: Dg Abd Acute W/chest  09/07/2012  *RADIOLOGY REPORT*  Clinical Data: Vomiting, diarrhea and pain.  ACUTE ABDOMEN SERIES (ABDOMEN 2 VIEW & CHEST 1 VIEW)  Comparison: Chest radiograph 08/24/2012  Findings: Single view chest demonstrates a right dual lead cardiac pacemaker.  Heart size appears to be enlarged and may be accentuated by the technique.  Slightly prominent central vascular markings, particularly at the right lung base.  There is a  nonspecific abdominal bowel gas pattern.  Previous lumbar spine surgery and right hip replacement.  Multiple calcifications in the pelvis. Limited upright view but no clear evidence for free air.  IMPRESSION: Nonspecific bowel gas pattern.  Cardiomegaly with mild peribronchial thickening in the  right lung and cannot exclude basilar densities.  Findings could be related to dependent edema or atelectasis.   Original Report Authenticated By: Richarda Overlie, M.D.     Medications:  Prior to Admission:  Prescriptions prior to admission  Medication Sig Dispense Refill  . amLODipine (NORVASC) 10 MG tablet Take 10 mg by mouth daily.      Marland Kitchen aspirin 81 MG tablet Take 1 tablet (81 mg total) by mouth daily.      Marland Kitchen atorvastatin (LIPITOR) 40 MG tablet Take 1 tablet (40 mg total) by mouth daily at 6 PM.  30 tablet  6  . benazepril (LOTENSIN) 20 MG tablet Take 20 mg by mouth 2 (two) times daily.      . Bromfenac Sodium (BROMDAY) 0.09 % SOLN Place 1 drop into the right eye 3 (three) times daily.      Marland Kitchen doxepin (SINEQUAN) 25 MG capsule Take 25 mg by mouth at bedtime.      Marland Kitchen estradiol (ESTRACE) 0.5 MG tablet Take 0.5 mg by mouth daily.        . fish oil-omega-3 fatty acids 1000 MG capsule Take 1 g by mouth 2 (two) times daily.       . furosemide (LASIX) 40 MG tablet Take 2 tablets (80 mg total) by mouth 2 (two) times daily.  120 tablet  3  . gabapentin (NEURONTIN) 300 MG capsule Take 300 mg by mouth 3 (three) times daily.      . hydrALAZINE (APRESOLINE) 25 MG tablet Take 1 tablet (25 mg total) by mouth every 8 (eight) hours.  90 tablet  6  . ketoprofen (ORUDIS) 50 MG capsule Take 50 mg by mouth 3 (three) times daily.      . metFORMIN (GLUCOPHAGE) 500 MG tablet Take 500 mg by mouth 2 (two) times daily with a meal.       . metoCLOPramide (REGLAN) 10 MG tablet Take 10 mg by mouth 4 (four) times daily.       . metoprolol tartrate (LOPRESSOR) 25 MG tablet Take 0.5 tablets (12.5 mg total) by mouth 2 (two) times daily.  30 tablet  6  . ondansetron (ZOFRAN-ODT) 8 MG disintegrating tablet Take 8 mg by mouth every 8 (eight) hours as needed. Nausea and Vomiting      . phenytoin (DILANTIN) 100 MG ER capsule Take 300 mg by mouth at bedtime.        . potassium chloride (KLOR-CON) 10 MEQ CR tablet Take 10 mEq by mouth  daily.      Marland Kitchen senna-docusate (SENOKOT-S) 8.6-50 MG per tablet Take 1 tablet by mouth daily as needed.      . nitroGLYCERIN (NITROSTAT) 0.4 MG SL tablet Place 1 tablet (0.4 mg total) under the tongue every 5 (five) minutes as needed for chest pain (Up to 3 doses).  25 tablet  3   Scheduled:   . sodium chloride   Intravenous STAT  . amLODipine  10 mg Oral Daily  . atorvastatin  40 mg Oral q1800  . benazepril  20 mg Oral BID  . doxepin  25 mg Oral QHS  . enoxaparin (LOVENOX) injection  40 mg Subcutaneous Q24H  . gabapentin  300 mg Oral TID  . hydrALAZINE  25  mg Oral Q8H  . insulin aspart  0-20 Units Subcutaneous TID WC  . insulin aspart  0-5 Units Subcutaneous QHS  . metoCLOPramide  10 mg Oral QID  . metoprolol tartrate  12.5 mg Oral BID  . pantoprazole (PROTONIX) IV  40 mg Intravenous Q24H  . phenytoin  300 mg Oral QHS  . potassium chloride  10 mEq Intravenous Once  . potassium chloride  10 mEq Intravenous Q1 Hr x 6  . potassium chloride SA  40 mEq Oral Once  . sodium chloride  500 mL Intravenous Once  . DISCONTD: metFORMIN  500 mg Oral BID WC   Continuous:   . sodium chloride 75 mL/hr at 09/08/12 0950  . DISCONTD: sodium chloride Stopped (09/07/12 1315)   ZOX:[id number], acetaminophen, alum & mag hydroxide-simeth, nitroGLYCERIN, ondansetron (ZOFRAN) IV, ondansetron (ZOFRAN) IV, ondansetron  Assesment: She has abdominal discomfort nausea and vomiting. She has a history of gastroparesis and is off her Reglan. She has been hypokalemic. She has multiple other medical problems Active Problems:  * No active hospital problems. *     Plan: She will have EGD on the seventh she has restarted Reglan she has medications available for nausea    LOS: 1 day   [name] 09/08/2012, 10:46 AM

## 2012-09-08 NOTE — Consult Note (Signed)
Referring Provider: Dr. Juanetta Gosling Primary Care Physician:  Fredirick Maudlin, MD Primary Gastroenterologist:  Dr.  Jaquita Rector for Consultation:  Nausea and vomiting  HPI: 71 year old African American lady with multiple comorbidities admitted the hospital with nausea and vomiting; states she's had nausea and vomiting for a month. No associated abdominal pain. Denies diarrhea, melena, hematochezia or constipation. Has history diabetic gastroparesis and tells me she has not been taking her Reglan/metoclopramide. She is vague as to why she has not been taking it.  History of NSAID-induced gastric ulceration seen back on 2007 EGD. Subsequent EGD did document healing. Prior to that, she had a history of H. pylori gastritis and was treated. Gallbladder is out.  I note she currently  takes ketoprofen and aspirin daily and has not been on a PPI.  Past Medical History  Diagnosis Date  . Diabetes mellitus   . HTN (hypertension)   . Seizure disorder   . CAD (coronary artery disease)     multivessel, CABG 6/10.  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  Myoview (11/20/11) showed no ischemia & EF 62%  . GERD (gastroesophageal reflux disease)   . Hyperlipidemia   . DVT (deep venous thrombosis)     R arm venous occlusion s/p prior PPM  . Obesity   . DDD (degenerative disc disease)   . Complete heart block 1993    s/p PPM, most recent generator change 2003  . Dehiscence of closure of sternum or sternotomy 8/10    required flap  . COPD (chronic obstructive pulmonary disease)     severe lung disease by PFTs 6/12  . Asthma   . Sleep apnea   . Wound infection     Sternal  . H/O: hysterectomy   . Chronic kidney disease   . Seizures     Past Surgical History  Procedure Date  . Pacemaker placement 1993    Status post DDD pacemaker implantation in 1993, with [employer] Kappa generator change in 2003 for  second-degree AV block, most recent gen change by Fawn Kirk 04/14/11  . Revision total hip arthroplasty   . Replacement total knee     . Thyroid surgery   . Wrist surgery   . Coronary artery bypass graft     - LIMA to LAD, SVG to OM, SVG to PDA  . Reconstructive repair sternal     for infection s/p CABG 8/10  . Back surgery   . Esophagogastroduodenoscopy     with [facility] dilation,  biopsy, and disruption Schatzki's ring  . Colonoscopy 04/27/2005    Prior to Admission medications   Medication Sig Start Date End Date Taking? Authorizing Provider  amLODipine (NORVASC) 10 MG tablet Take 10 mg by mouth daily. 11/21/11 11/20/12 Yes Dayna N Dunn, PA  aspirin 81 MG tablet Take 1 tablet (81 mg total) by mouth daily. 11/21/11  Yes Dayna N Dunn, PA  atorvastatin (LIPITOR) 40 MG tablet Take 1 tablet (40 mg total) by mouth daily at 6 PM. 03/25/12 03/25/13 Yes Ok Anis, NP  benazepril (LOTENSIN) 20 MG tablet Take 20 mg by mouth 2 (two) times daily.   Yes Historical Provider, MD  Bromfenac Sodium (BROMDAY) 0.09 % SOLN Place 1 drop into the right eye 3 (three) times daily.   Yes Historical Provider, MD  doxepin (SINEQUAN) 25 MG capsule Take 25 mg by mouth at bedtime.   Yes Historical Provider, MD  estradiol (ESTRACE) 0.5 MG tablet Take 0.5 mg by mouth daily.     Yes Historical Provider, MD  fish oil-omega-3  fatty acids 1000 MG capsule Take 1 g by mouth 2 (two) times daily.    Yes Historical Provider, MD  furosemide (LASIX) 40 MG tablet Take 2 tablets (80 mg total) by mouth 2 (two) times daily. 07/20/12  Yes Marinus Maw, MD  gabapentin (NEURONTIN) 300 MG capsule Take 300 mg by mouth 3 (three) times daily.   Yes Historical Provider, MD  hydrALAZINE (APRESOLINE) 25 MG tablet Take 1 tablet (25 mg total) by mouth every 8 (eight) hours. 11/21/11 11/20/12 Yes Dayna N Dunn, PA  ketoprofen (ORUDIS) 50 MG capsule Take 50 mg by mouth 3 (three) times daily.   Yes Historical Provider, MD  metFORMIN (GLUCOPHAGE) 500 MG tablet Take 500 mg by mouth 2 (two) times daily with a meal.    Yes Historical Provider, MD  metoCLOPramide (REGLAN) 10 MG  tablet Take 10 mg by mouth 4 (four) times daily.    Yes Historical Provider, MD  metoprolol tartrate (LOPRESSOR) 25 MG tablet Take 0.5 tablets (12.5 mg total) by mouth 2 (two) times daily. 03/25/12  Yes Ok Anis, NP  ondansetron (ZOFRAN-ODT) 8 MG disintegrating tablet Take 8 mg by mouth every 8 (eight) hours as needed. Nausea and Vomiting 08/24/12  Yes Geoffery Lyons, MD  phenytoin (DILANTIN) 100 MG ER capsule Take 300 mg by mouth at bedtime.     Yes Historical Provider, MD  potassium chloride (KLOR-CON) 10 MEQ CR tablet Take 10 mEq by mouth daily. 11/21/11  Yes Dayna N Dunn, PA  senna-docusate (SENOKOT-S) 8.6-50 MG per tablet Take 1 tablet by mouth daily as needed.   Yes Historical Provider, MD  nitroGLYCERIN (NITROSTAT) 0.4 MG SL tablet Place 1 tablet (0.4 mg total) under the tongue every 5 (five) minutes as needed for chest pain (Up to 3 doses). 11/21/11 11/20/12  Laurann Montana, PA    Current Facility-Administered Medications  Medication Dose Route Frequency Provider Last Rate Last Dose  . 0.9 %  sodium chloride infusion   Intravenous STAT [name], MD 100 mL/hr at 09/07/12 1635 100 mL/hr at 09/07/12 1635  . 0.9 %  sodium chloride infusion   Intravenous Continuous Carylon Perches, MD 75 mL/hr at 09/07/12 1745    . acetaminophen (TYLENOL) tablet 650 mg  650 mg Oral Q6H PRN Carylon Perches, MD       Or  . acetaminophen (TYLENOL) suppository 650 mg  650 mg Rectal Q6H PRN Carylon Perches, MD      . alum & mag hydroxide-simeth (MAALOX/MYLANTA) 200-200-20 MG/5ML suspension 30 mL  30 mL Oral Q6H PRN Carylon Perches, MD      . amLODipine (NORVASC) tablet 10 mg  10 mg Oral Daily Carylon Perches, MD   10 mg at 09/07/12 1829  . atorvastatin (LIPITOR) tablet 40 mg  40 mg Oral q1800 Carylon Perches, MD   40 mg at 09/07/12 1817  . benazepril (LOTENSIN) tablet 20 mg  20 mg Oral BID Carylon Perches, MD   20 mg at 09/07/12 2039  . doxepin (SINEQUAN) capsule 25 mg  25 mg Oral QHS Carylon Perches, MD   25 mg at 09/07/12 2039  . enoxaparin  (LOVENOX) injection 40 mg  40 mg Subcutaneous Q24H Carylon Perches, MD   40 mg at 09/07/12 2042  . gabapentin (NEURONTIN) capsule 300 mg  300 mg Oral TID Carylon Perches, MD   300 mg at 09/07/12 1829  . hydrALAZINE (APRESOLINE) tablet 25 mg  25 mg Oral Q8H Carylon Perches, MD   25 mg at 09/08/12  0515  . insulin aspart (novoLOG) injection 0-20 Units  0-20 Units Subcutaneous TID WC Carylon Perches, MD   4 Units at 09/08/12 0840  . insulin aspart (novoLOG) injection 0-5 Units  0-5 Units Subcutaneous QHS Carylon Perches, MD      . metoCLOPramide (REGLAN) tablet 10 mg  10 mg Oral QID Carylon Perches, MD   10 mg at 09/07/12 2040  . metoprolol tartrate (LOPRESSOR) tablet 12.5 mg  12.5 mg Oral BID Carylon Perches, MD   12.5 mg at 09/07/12 2041  . nitroGLYCERIN (NITROSTAT) SL tablet 0.4 mg  0.4 mg Sublingual Q5 min PRN Carylon Perches, MD      . ondansetron [facility]) injection 4 mg  4 mg Intravenous Q8H PRN [name], MD      . ondansetron [facility]) tablet 4 mg  4 mg Oral Q6H PRN Carylon Perches, MD       Or  . ondansetron [facility]) injection 4 mg  4 mg Intravenous Q6H PRN Carylon Perches, MD      . pantoprazole (PROTONIX) injection 40 mg  40 mg Intravenous Q24H Carylon Perches, MD   40 mg at 09/07/12 2038  . phenytoin (DILANTIN) ER capsule 300 mg  300 mg Oral QHS Carylon Perches, MD   300 mg at 09/07/12 2041  . potassium chloride 10 mEq in 100 mL IVPB  10 mEq Intravenous Once [name], MD 100 mL/hr at 09/07/12 1451 10 mEq at 09/07/12 1451  . potassium chloride 10 mEq in 100 mL IVPB  10 mEq Intravenous Q1 Hr x 6 Carylon Perches, MD   10 mEq at 09/07/12 2311  . potassium chloride SA (K-DUR,KLOR-CON) CR tablet 40 mEq  40 mEq Oral Once [name], MD   40 mEq at 09/07/12 1451  . sodium chloride 0.9 % bolus 500 mL  500 mL Intravenous Once [name], MD   1,000 mL at 09/07/12 1331  . DISCONTD: 0.9 %  sodium chloride infusion   Intravenous Continuous [name], MD      . DISCONTD: metFORMIN (GLUCOPHAGE) tablet 500 mg  500 mg Oral BID WC Carylon Perches, MD         Allergies as of 09/07/2012 - Review Complete 09/07/2012  Allergen Reaction Noted  . Codeine  05/13/2009  . Other  03/23/2012  . Oxycodone hcl      Family History  Problem Relation Age of Onset  . Diabetes    . Heart disease Mother     MI  . Coronary artery disease      Female <55  . Cancer      History   Social History  . Marital Status: Married    Spouse Name: N/A    Number of Children: N/A  . Years of Education: N/A   Occupational History  . Not on file.   Social History Main Topics  . Smoking status: Former Games developer  . Smokeless tobacco: Never Used  . Alcohol Use: No  . Drug Use: No  . Sexually Active: Not Currently    Birth Control/ Protection: Post-menopausal, Surgical   Other Topics Concern  . Not on file   Social History Narrative  . No narrative on file    Review of Systems: Gen: Denies any fever, chills, sweats, anorexia, fatigue, weakness, malaise, weight loss, and sleep disorder CV: Denies chest pain, angina, palpitations, syncope, orthopnea, PND, peripheral edema, and claudication. Resp: Denies dyspnea at rest, dyspnea with exercise, cough, sputum, wheezing, coughing up blood, and pleurisy.  GI: Denies vomiting blood, jaundice, and fecal incontinence.   Denies dysphagia or odynophagia. Derm: Denies rash, itching, dry skin, hives, moles, warts, or unhealing ulcers.  Psych: Denies depression, anxiety, memory loss, suicidal ideation, hallucinations, paranoia, and confusion. Heme: Denies bruising, bleeding, and enlarged lymph nodes.   Physical Exam: Vital signs in last 24 hours: Temp:  [98 F (36.7 C)-99.6 F (37.6 C)] 98.2 F (36.8 C) (10/05 0357) Pulse Rate:  [64-75] 64  (10/05 0357) Resp:  [16-20] 20  (10/05 0357) BP: (139-206)/(56-100) 154/62 mmHg (10/05 0357) SpO2:  [96 %-100 %] 97 % (10/05 0357) Weight:  [230 lb (104.327 kg)-250 lb (113.399 kg)] 230 lb (104.327 kg) (10/05 0113) Last BM Date: 09/06/12 General:   Alert,  fretful, obese  lady; pleasant and cooperative in NAD Head:  Normocephalic and atraumatic. Eyes:  Sclera clear, no icterus.   Conjunctiva pink. Ears:  Normal auditory acuity. Nose:  No deformity, discharge,  or lesions. Mouth:  No deformity or lesions, dentition normal. Neck:  Supple; no masses or thyromegaly. Lungs:  Clear throughout to auscultation.   No wheezes, crackles, or rhonchi. No acute distress. Heart:  Regular rate and rhythm; no murmurs, clicks, rubs,  or gallops. Abdomen: Full. Positive bowel sounds, soft nontender with no obvious mass or organomegaly. No succussion splash. Msk:  Symmetrical without gross deformities. Normal posture. Pulses:  Normal pulses noted. Extremities:  Without clubbing or edema. Neurologic:  Alert and  oriented x4;  grossly normal neurologically. Skin:  Intact without significant lesions or rashes. Cervical Nodes:  No significant cervical adenopathy. Psych:  Alert and cooperative. Normal mood and affect.  Intake/Output from previous day: 10/04 0701 - 10/05 0700 In: 1536.3 [P.O.:240; I.V.:786.3; IV Piggyback:510] Out: 2500 [Urine:2500] Intake/Output this shift: Total I/O In: -  Out: 200 [Urine:200]  Lab Results:  [facility] 09/07/12 1322  WBC 9.3  HGB 9.2*  HCT 28.5*  PLT 244   BMET  Basename 09/08/12 0655 09/07/12 1322  NA 143 141  K 3.3* 2.7*  CL 104 99  CO2 28 29  GLUCOSE 128* 103*  BUN 5* 9  CREATININE 0.83 1.05  CALCIUM 9.1 9.3   LFT  Basename 09/07/12 1322  PROT 7.5  ALBUMIN 3.8  AST 16  ALT 11  ALKPHOS 78  BILITOT 0.2*  BILIDIR --  IBILI --   Studies/Results: Dg Abd Acute W/chest  09/07/2012  *RADIOLOGY REPORT*  Clinical Data: Vomiting, diarrhea and pain.  ACUTE ABDOMEN SERIES (ABDOMEN 2 VIEW & CHEST 1 VIEW)  Comparison: Chest radiograph 08/24/2012  Findings: Single view chest demonstrates a right dual lead cardiac pacemaker.  Heart size appears to be enlarged and may be accentuated by the technique.  Slightly prominent central  vascular markings, particularly at the right lung base.  There is a nonspecific abdominal bowel gas pattern.  Previous lumbar spine surgery and right hip replacement.  Multiple calcifications in the pelvis. Limited upright view but no clear evidence for free air.  IMPRESSION: Nonspecific bowel gas pattern.  Cardiomegaly with mild peribronchial thickening in the right lung and cannot exclude basilar densities.  Findings could be related to dependent edema or atelectasis.   Original Report Authenticated By: Richarda Overlie, M.D.    Impression: Pleasant 60 year-year-old lady with a history of diabetic gastroparesis and a one-month history of nausea and vomiting. There is a paucity of abdominal pain. History of NSAID-induced peptic ulcer disease previously. Now taking aspirin and ketoprofen without concomitant acid suppression therapy. It is not clear why she has not  been taking prokinetic therapy in the way of Reglan.  Her recent symptoms of nausea and vomiting could simply be due to symptomatic gastroparesis without her usual Reglan dosing. However, in this setting other diagnostic possibilities need to be kept in mind including peptic ulcer disease. She is a relatively high risk for peptic ulcer disease.  I doubt mechanical obstruction.  Recommendations: Agree with Empiric proton pump inhibitor and continuing metoclopramide. I have offered this nice lady a diagnostic EGD on October 7.The risks, benefits, limitations, alternatives and imponderables have been reviewed with the patient. Potential for esophageal dilation, biopsy, etc. have also been reviewed.  Questions have been answered. All parties agreeable.  Will Hold her Lovenox the evening before the EGD.  I'd like to thank Dr. Juanetta Gosling for allowing me to see this nice lady once again.

## 2012-09-09 LAB — URINE CULTURE
Colony Count: NO GROWTH
Culture: NO GROWTH

## 2012-09-09 LAB — CBC WITH DIFFERENTIAL/PLATELET
Eosinophils Absolute: 0 10*3/uL (ref 0.0–0.7)
Eosinophils Relative: 0 % (ref 0–5)
HCT: 31.1 % — ABNORMAL LOW (ref 36.0–46.0)
Lymphocytes Relative: 7 % — ABNORMAL LOW (ref 12–46)
Lymphs Abs: 1.2 10*3/uL (ref 0.7–4.0)
MCH: 27.7 pg (ref 26.0–34.0)
MCV: 86.9 fL (ref 78.0–100.0)
Monocytes Absolute: 1.3 10*3/uL — ABNORMAL HIGH (ref 0.1–1.0)
Platelets: 261 10*3/uL (ref 150–400)
RBC: 3.58 MIL/uL — ABNORMAL LOW (ref 3.87–5.11)

## 2012-09-09 LAB — BASIC METABOLIC PANEL
CO2: 26 mEq/L (ref 19–32)
Calcium: 9 mg/dL (ref 8.4–10.5)
Chloride: 105 mEq/L (ref 96–112)
Creatinine, Ser: 1 mg/dL (ref 0.50–1.10)
Glucose, Bld: 131 mg/dL — ABNORMAL HIGH (ref 70–99)

## 2012-09-09 LAB — GLUCOSE, CAPILLARY
Glucose-Capillary: 164 mg/dL — ABNORMAL HIGH (ref 70–99)
Glucose-Capillary: 139 mg/dL — ABNORMAL HIGH (ref 70–99)
Glucose-Capillary: 127 mg/dL — ABNORMAL HIGH (ref 70–99)

## 2012-09-09 NOTE — H&P (Signed)
[name], [name]                ACCOUNT NO.:  [id number]  MEDICAL RECORD NO.:  [id number]  LOCATION:  A328                          FACILITY:  [facility]  PHYSICIAN:  Kingsley Callander. Ouida Sills, MD       DATE OF BIRTH:  20-May-1941  DATE OF ADMISSION:  09/07/2012 DATE OF DISCHARGE:  LH                             HISTORY & PHYSICAL   CHIEF COMPLAINT:  Vomiting.  HISTORY OF PRESENT ILLNESS:  This patient is a 71 year old African American female patient of Dr. Juanetta Gosling who presented to the emergency room complaining of recurrent nausea and vomiting over the past 3 weeks. This is her 2nd emergency room presentation over that period.  She denies any hematemesis.  She denies diarrhea at this point, although she has had some diarrhea in recent days.  She had a normal stool on the day prior to admission.  She denies any hematemesis, melena or rectal bleeding.  She has not had fever.  She has a history of diabetes, but there is evidently no definite history of gastroparesis.  She has had difficulty with eating and drinking.  She is routinely on metoclopramide.  It appears she has recently been on ketoprofen.  She denies any history of peptic ulcer disease.  PAST MEDICAL HISTORY: 1. Diabetes. 2. Hypertension. 3. Seizure disorder. 4. Coronary heart disease, status post MI and bypass surgery. 5. GERD. 6. Hyperlipidemia. 7. Right upper extremity DVT. 8. Pacemaker placement. 9. Obesity. 10.COPD. 11.Sleep apnea. 12.Hysterectomy. 13.Chronic kidney disease. 14.Status post bilateral knee replacements with the left one twice.  MEDICATIONS: 1. Amlodipine 10 mg daily. 2. Aspirin 81 mg daily. 3. Lipitor 40 mg daily. 4. Lotensin 20 mg b.i.d. 5. Bromfenac solution t.i.d. 6. Doxepin 25 mg at bedtime. 7. Estrace 0.5 mg daily. 8. Fish oil b.i.d. 9. Lasix 80 mg daily. 10.Gabapentin 300 mg t.i.d. 11.Hydralazine 25 mg t.i.d. 12.Ketoprofen 50 mg t.i.d. 13.Metformin 500 mg b.i.d. 14.Reglan 10 mg  q.i.d. 15.Lopressor 12.5 mg b.i.d. 16.Zofran p.r.n. 17.Dilantin 300 mg at bedtime. 18.Potassium 10 mEq daily. 19.Senokot S daily as needed. 20.Nitroglycerin p.r.n.  ALLERGIES:  CODEINE and OXYCODONE.  SOCIAL HISTORY:  She denies tobacco, alcohol, or drug use.  FAMILY HISTORY:  Remarkable for an MI in her mother.  REVIEW OF SYSTEMS:  No fever, chills, chest pain, difficulty breathing, difficulty voiding.  PHYSICAL EXAMINATION:  VITAL SIGNS:  Temperature 98, pulse 64, respirations 16, blood pressure 139/100. GENERAL:  Alert and in no acute distress. HEENT:  No scleral icterus.  Oropharynx appears somewhat dry. NECK:  Supple with no JVD or thyromegaly. LUNGS:  Clear. HEART:  Regular. ABDOMEN:  Soft, nondistended, and nontender with no hepatosplenomegaly. No CVA tenderness. EXTREMITIES:  No clubbing or edema.  She has bilateral knee replacements. NEURO:  No focal weakness. LYMPH NODES:  No cervical or supraclavicular enlargement.  LABORATORY DATA:  Sodium 141, potassium 2.7, bicarb 29, BUN 9, creatinine 1.05, glucose 128, SGOT 16, SGPT 11, bilirubin 0.2, calcium 9.3.  White count 9.3, hemoglobin 9.2, platelets 244,000.  Urinalysis is negative.  The chest x-ray reveals cardiomegaly with mild peribronchial thickening in the right lung.  IMPRESSION/PLAN: 1. Recurrent nausea and vomiting, question diabetic-related  gastroparesis.  We will continue metoclopramide, treat with IV     fluids and antiemetics as needed. 2. Hypokalemia.  We will replace potassium intravenously. 3. Diabetes.  We will hold metformin and treat with sliding scale     NovoLog as needed. 4. Normocytic anemia. 5. Seizure disorder.  Check Dilantin level. 6. Hypertension.  Continue antihypertensive regimen. 7. Osteoarthritis, status post knee replacements. 8. Coronary heart disease, stable. 9. Gastroesophageal reflux disease, treat with IV Protonix. 10.Status post pacemaker placement. 11.History of  sleep apnea. 12.History of chronic kidney disease.     Kingsley Callander. Ouida Sills, MD     ROF/MEDQ  D:  09/08/2012  T:  09/09/2012  Job:  [id number]

## 2012-09-09 NOTE — Progress Notes (Signed)
Subjective: She says she feels a little better. She has no new complaints. Her abdomen is better. Her nausea and vomiting are better  Objective: Vital signs in last 24 hours: Temp:  [98.2 F (36.8 C)-101.8 F (38.8 C)] 98.2 F (36.8 C) (10/06 0557) Pulse Rate:  [67-72] 70  (10/06 0557) Resp:  [20] 20  (10/06 0557) BP: (170-194)/(74-88) 188/88 mmHg (10/06 0557) SpO2:  [92 %-93 %] 93 % (10/06 0557) Weight:  [101.47 kg (223 lb 11.2 oz)] 101.47 kg (223 lb 11.2 oz) (10/06 0557) Weight change: -7.394 kg (-16 lb 4.8 oz) Last BM Date: 09/08/12  Intake/Output from previous day: 10/05 0701 - 10/06 0700 In: 3429 [P.O.:240; I.V.:3185; IV Piggyback:4] Out: 1751 [Urine:1400; Emesis/NG output:350; Stool:1]  PHYSICAL EXAM General appearance: alert, cooperative and morbidly obese Resp: clear to auscultation bilaterally Cardio: irregularly irregular rhythm GI: Mildly diffusely tender but not as bad Extremities: extremities normal, atraumatic, no cyanosis or edema  Lab Results:    Basic Metabolic Panel:  Basename 09/09/12 0720 09/08/12 0655  NA 144 143  K 3.5 3.3*  CL 105 104  CO2 26 28  GLUCOSE 131* 128*  BUN 8 5*  CREATININE 1.00 0.83  CALCIUM 9.0 9.1  MG -- --  PHOS -- --   Liver Function Tests:  Basename 09/07/12 1322  AST 16  ALT 11  ALKPHOS 78  BILITOT 0.2*  PROT 7.5  ALBUMIN 3.8   No results found for this basename: LIPASE:2,AMYLASE:2 in the last 72 hours No results found for this basename: AMMONIA:2 in the last 72 hours CBC:  Basename 09/09/12 0720 09/07/12 1322  WBC 16.9* 9.3  NEUTROABS 14.4* 6.9  HGB 9.9* 9.2*  HCT 31.1* 28.5*  MCV 86.9 85.8  PLT 261 244   Cardiac Enzymes: No results found for this basename: CKTOTAL:3,CKMB:3,CKMBINDEX:3,TROPONINI:3 in the last 72 hours BNP: No results found for this basename: PROBNP:3 in the last 72 hours D-Dimer: No results found for this basename: DDIMER:2 in the last 72 hours CBG:  Basename 09/08/12 2024 09/08/12  1644 09/08/12 1157 09/08/12 0838 09/07/12 2037 09/07/12 1820  GLUCAP 140* 156* 110* 156* 128* 145*   Hemoglobin A1C:  Basename 09/07/12 1322  HGBA1C 6.1*   Fasting Lipid Panel: No results found for this basename: CHOL,HDL,LDLCALC,TRIG,CHOLHDL,LDLDIRECT in the last 72 hours Thyroid Function Tests: No results found for this basename: TSH,T4TOTAL,FREET4,T3FREE,THYROIDAB in the last 72 hours Anemia Panel: No results found for this basename: VITAMINB12,FOLATE,FERRITIN,TIBC,IRON,RETICCTPCT in the last 72 hours Coagulation: No results found for this basename: LABPROT:2,INR:2 in the last 72 hours Urine Drug Screen: Drugs of Abuse  No results found for this basename: labopia, cocainscrnur, labbenz, amphetmu, thcu, labbarb    Alcohol Level: No results found for this basename: ETH:2 in the last 72 hours Urinalysis:  Basename 09/07/12 1312  COLORURINE YELLOW  LABSPEC 1.010  PHURINE 7.5  GLUCOSEU NEGATIVE  HGBUR NEGATIVE  BILIRUBINUR NEGATIVE  KETONESUR NEGATIVE  PROTEINUR NEGATIVE  UROBILINOGEN 0.2  NITRITE NEGATIVE  LEUKOCYTESUR NEGATIVE   Misc. Labs:  ABGS No results found for this basename: PHART,PCO2,PO2ART,TCO2,HCO3 in the last 72 hours CULTURES No results found for this or any previous visit (from the past 240 hour(s)). Studies/Results: Dg Abd Acute W/chest  09/07/2012  *RADIOLOGY REPORT*  Clinical Data: Vomiting, diarrhea and pain.  ACUTE ABDOMEN SERIES (ABDOMEN 2 VIEW & CHEST 1 VIEW)  Comparison: Chest radiograph 08/24/2012  Findings: Single view chest demonstrates a right dual lead cardiac pacemaker.  Heart size appears to be enlarged and may be accentuated by  the technique.  Slightly prominent central vascular markings, particularly at the right lung base.  There is a nonspecific abdominal bowel gas pattern.  Previous lumbar spine surgery and right hip replacement.  Multiple calcifications in the pelvis. Limited upright view but no clear evidence for free air.  IMPRESSION:  Nonspecific bowel gas pattern.  Cardiomegaly with mild peribronchial thickening in the right lung and cannot exclude basilar densities.  Findings could be related to dependent edema or atelectasis.   Original Report Authenticated By: Richarda Overlie, M.D.     Medications:  Prior to Admission:  Prescriptions prior to admission  Medication Sig Dispense Refill  . amLODipine (NORVASC) 10 MG tablet Take 10 mg by mouth daily.      Marland Kitchen aspirin 81 MG tablet Take 1 tablet (81 mg total) by mouth daily.      Marland Kitchen atorvastatin (LIPITOR) 40 MG tablet Take 1 tablet (40 mg total) by mouth daily at 6 PM.  30 tablet  6  . benazepril (LOTENSIN) 20 MG tablet Take 20 mg by mouth 2 (two) times daily.      . Bromfenac Sodium (BROMDAY) 0.09 % SOLN Place 1 drop into the right eye 3 (three) times daily.      Marland Kitchen doxepin (SINEQUAN) 25 MG capsule Take 25 mg by mouth at bedtime.      Marland Kitchen estradiol (ESTRACE) 0.5 MG tablet Take 0.5 mg by mouth daily.        . fish oil-omega-3 fatty acids 1000 MG capsule Take 1 g by mouth 2 (two) times daily.       . furosemide (LASIX) 40 MG tablet Take 2 tablets (80 mg total) by mouth 2 (two) times daily.  120 tablet  3  . gabapentin (NEURONTIN) 300 MG capsule Take 300 mg by mouth 3 (three) times daily.      . hydrALAZINE (APRESOLINE) 25 MG tablet Take 1 tablet (25 mg total) by mouth every 8 (eight) hours.  90 tablet  6  . ketoprofen (ORUDIS) 50 MG capsule Take 50 mg by mouth 3 (three) times daily.      . metFORMIN (GLUCOPHAGE) 500 MG tablet Take 500 mg by mouth 2 (two) times daily with a meal.       . metoCLOPramide (REGLAN) 10 MG tablet Take 10 mg by mouth 4 (four) times daily.       . metoprolol tartrate (LOPRESSOR) 25 MG tablet Take 0.5 tablets (12.5 mg total) by mouth 2 (two) times daily.  30 tablet  6  . ondansetron (ZOFRAN-ODT) 8 MG disintegrating tablet Take 8 mg by mouth every 8 (eight) hours as needed. Nausea and Vomiting      . phenytoin (DILANTIN) 100 MG ER capsule Take 300 mg by mouth at  bedtime.        . potassium chloride (KLOR-CON) 10 MEQ CR tablet Take 10 mEq by mouth daily.      Marland Kitchen senna-docusate (SENOKOT-S) 8.6-50 MG per tablet Take 1 tablet by mouth daily as needed.      . nitroGLYCERIN (NITROSTAT) 0.4 MG SL tablet Place 1 tablet (0.4 mg total) under the tongue every 5 (five) minutes as needed for chest pain (Up to 3 doses).  25 tablet  3   Scheduled:   . amLODipine  10 mg Oral Daily  . atorvastatin  40 mg Oral q1800  . benazepril  20 mg Oral BID  . doxepin  25 mg Oral QHS  . enoxaparin (LOVENOX) injection  40 mg Subcutaneous Q24H  . gabapentin  300 mg Oral TID  . hydrALAZINE  25 mg Oral Q8H  . insulin aspart  0-20 Units Subcutaneous TID WC  . insulin aspart  0-5 Units Subcutaneous QHS  . metoCLOPramide (REGLAN) injection  10 mg Intravenous Q6H  . metoprolol tartrate  12.5 mg Oral BID  . pantoprazole (PROTONIX) IV  40 mg Intravenous Q24H  . phenytoin  300 mg Oral QHS  . DISCONTD: metoCLOPramide  10 mg Oral QID   Continuous:   . 0.9 % NaCl with KCl 20 mEq / L 75 mL/hr at 09/09/12 0054  . DISCONTD: sodium chloride 75 mL/hr at 09/08/12 0950   ZOX:[id number], acetaminophen, alum & mag hydroxide-simeth, nitroGLYCERIN, ondansetron (ZOFRAN) IV, ondansetron, promethazine  Assesment: She has abdominal pain nausea and vomiting. This may be related to ulcers or diabetic gastroparesis or other cause she has multiple other medical problems. Principal Problem:  *Nausea & vomiting Active Problems:  HYPERTENSION, BENIGN  CAD (coronary artery disease)  Gastroparesis  SEIZURE DISORDER  Chronic diastolic heart failure  COPD (chronic obstructive pulmonary disease)  Dehydration  Hypokalemia    Plan: To have EGD tomorrow. She is improved and will continue on current medications    LOS: 2 days   Daniyah Fohl L 09/09/2012, 10:08 AM

## 2012-09-10 ENCOUNTER — Encounter (HOSPITAL_COMMUNITY)
Admission: EM | Disposition: A | Discharge status: Home Health | Payer: Self-pay | Source: Home / Self Care | Attending: Pulmonary Disease

## 2012-09-10 ENCOUNTER — Encounter (HOSPITAL_COMMUNITY): Payer: Self-pay | Admitting: *Deleted

## 2012-09-10 DIAGNOSIS — R112 Nausea with vomiting, unspecified: Secondary | ICD-10-CM

## 2012-09-10 DIAGNOSIS — K259 Gastric ulcer, unspecified as acute or chronic, without hemorrhage or perforation: Secondary | ICD-10-CM

## 2012-09-10 DIAGNOSIS — K2990 Gastroduodenitis, unspecified, without bleeding: Secondary | ICD-10-CM

## 2012-09-10 DIAGNOSIS — K2970 Gastritis, unspecified, without bleeding: Secondary | ICD-10-CM

## 2012-09-10 DIAGNOSIS — R109 Unspecified abdominal pain: Secondary | ICD-10-CM

## 2012-09-10 HISTORY — PX: ESOPHAGOGASTRODUODENOSCOPY: SHX5428

## 2012-09-10 LAB — GLUCOSE, CAPILLARY
Glucose-Capillary: 130 mg/dL — ABNORMAL HIGH (ref 70–99)
Glucose-Capillary: 119 mg/dL — ABNORMAL HIGH (ref 70–99)
Glucose-Capillary: 101 mg/dL — ABNORMAL HIGH (ref 70–99)

## 2012-09-10 SURGERY — EGD (ESOPHAGOGASTRODUODENOSCOPY)
Anesthesia: Moderate Sedation | Laterality: Left

## 2012-09-10 MED ORDER — MIDAZOLAM HCL 5 MG/5ML IJ SOLN
INTRAMUSCULAR | Status: DC | PRN
  Administered 2012-09-10: 2 mg via INTRAVENOUS
  Administered 2012-09-10: 1 mg via INTRAVENOUS

## 2012-09-10 MED ORDER — MIDAZOLAM HCL 5 MG/5ML IJ SOLN
INTRAMUSCULAR | Status: AC
  Filled 2012-09-10: qty 5

## 2012-09-10 MED ORDER — MEPERIDINE HCL 100 MG/ML IJ SOLN
INTRAMUSCULAR | Status: AC
  Filled 2012-09-10: qty 1

## 2012-09-10 MED ORDER — SODIUM CHLORIDE 0.45 % IV SOLN
INTRAVENOUS | Status: DC
  Administered 2012-09-10: 11:00:00 via INTRAVENOUS

## 2012-09-10 MED ORDER — MEPERIDINE HCL 100 MG/ML IJ SOLN
INTRAMUSCULAR | Status: DC | PRN
  Administered 2012-09-10: 50 mg via INTRAVENOUS

## 2012-09-10 MED ORDER — STERILE WATER FOR IRRIGATION IR SOLN
Status: DC | PRN
  Administered 2012-09-10: 12:00:00

## 2012-09-10 MED ORDER — SODIUM CHLORIDE 0.9 % IV SOLN: INTRAVENOUS | Status: DC

## 2012-09-10 MED ORDER — TRAMADOL HCL 50 MG PO TABS
50.0000 mg | ORAL_TABLET | Freq: Four times a day (QID) | ORAL | Status: DC | PRN
  Administered 2012-09-11: 50 mg via ORAL
  Filled 2012-09-10: qty 1

## 2012-09-10 MED ORDER — ENOXAPARIN SODIUM 40 MG/0.4ML ~~LOC~~ SOLN
40.0000 mg | SUBCUTANEOUS | Status: DC
  Administered 2012-09-11 – 2012-09-13 (×3): 40 mg via SUBCUTANEOUS
  Filled 2012-09-10 (×3): qty 0.4

## 2012-09-10 MED ORDER — PANTOPRAZOLE SODIUM 40 MG PO TBEC
40.0000 mg | DELAYED_RELEASE_TABLET | Freq: Every day | ORAL | Status: DC
  Administered 2012-09-10 – 2012-09-12 (×3): 40 mg via ORAL
  Filled 2012-09-10 (×3): qty 1

## 2012-09-10 MED ORDER — METOCLOPRAMIDE HCL 10 MG PO TABS
5.0000 mg | ORAL_TABLET | Freq: Three times a day (TID) | ORAL | Status: DC
  Administered 2012-09-10 – 2012-09-11 (×4): 5 mg via ORAL
  Filled 2012-09-10 (×5): qty 1

## 2012-09-10 NOTE — Progress Notes (Signed)
Reported elevated blood pressure to [facility] and Dr. Jena Gauss. Dr. Jena Gauss said to notify Dr. Juanetta Gosling about blood pressure.Patient stable and asymptomatic at this time.

## 2012-09-10 NOTE — Progress Notes (Signed)
Subjective: She says she feels better. She is still having some abdominal discomfort but not as much. She is set for EGD later today  Objective: Vital signs in last 24 hours: Temp:  [98.2 F (36.8 C)-99.2 F (37.3 C)] 98.2 F (36.8 C) (10/07 0422) Pulse Rate:  [66-77] 77  (10/07 0422) Resp:  [20] 20  (10/07 0422) BP: (107-184)/(74-83) 107/83 mmHg (10/07 0455) SpO2:  [90 %-94 %] 94 % (10/07 0422) Weight:  [102.876 kg (226 lb 12.8 oz)] 102.876 kg (226 lb 12.8 oz) (10/07 0455) Weight change: 1.406 kg (3 lb 1.6 oz) Last BM Date: 09/10/12 (loose)  Intake/Output from previous day: 10/06 0701 - 10/07 0700 In: 2616.3 [I.V.:2616.3] Out: 1400 [Urine:1400]  PHYSICAL EXAM General appearance: alert, cooperative, mild distress and morbidly obese Resp: clear to auscultation bilaterally Cardio: irregularly irregular rhythm GI: soft, non-tender; bowel sounds normal; no masses,  no organomegaly Extremities: extremities normal, atraumatic, no cyanosis or edema  Lab Results:    Basic Metabolic Panel:  Basename 09/09/12 0720 09/08/12 0655  NA 144 143  K 3.5 3.3*  CL 105 104  CO2 26 28  GLUCOSE 131* 128*  BUN 8 5*  CREATININE 1.00 0.83  CALCIUM 9.0 9.1  MG -- --  PHOS -- --   Liver Function Tests:  Basename 09/07/12 1322  AST 16  ALT 11  ALKPHOS 78  BILITOT 0.2*  PROT 7.5  ALBUMIN 3.8   No results found for this basename: LIPASE:2,AMYLASE:2 in the last 72 hours No results found for this basename: AMMONIA:2 in the last 72 hours CBC:  Basename 09/09/12 0720 09/07/12 1322  WBC 16.9* 9.3  NEUTROABS 14.4* 6.9  HGB 9.9* 9.2*  HCT 31.1* 28.5*  MCV 86.9 85.8  PLT 261 244   Cardiac Enzymes: No results found for this basename: CKTOTAL:3,CKMB:3,CKMBINDEX:3,TROPONINI:3 in the last 72 hours BNP: No results found for this basename: PROBNP:3 in the last 72 hours D-Dimer: No results found for this basename: DDIMER:2 in the last 72 hours CBG:  Basename 09/10/12 0736 09/09/12  2013 09/09/12 1709 09/09/12 1218 09/09/12 0809 09/08/12 2024  GLUCAP 130* 127* 139* 122* 164* 140*   Hemoglobin A1C:  Basename 09/07/12 1322  HGBA1C 6.1*   Fasting Lipid Panel: No results found for this basename: CHOL,HDL,LDLCALC,TRIG,CHOLHDL,LDLDIRECT in the last 72 hours Thyroid Function Tests: No results found for this basename: TSH,T4TOTAL,FREET4,T3FREE,THYROIDAB in the last 72 hours Anemia Panel: No results found for this basename: VITAMINB12,FOLATE,FERRITIN,TIBC,IRON,RETICCTPCT in the last 72 hours Coagulation: No results found for this basename: LABPROT:2,INR:2 in the last 72 hours Urine Drug Screen: Drugs of Abuse  No results found for this basename: labopia, cocainscrnur, labbenz, amphetmu, thcu, labbarb    Alcohol Level: No results found for this basename: ETH:2 in the last 72 hours Urinalysis:  Basename 09/07/12 1312  COLORURINE YELLOW  LABSPEC 1.010  PHURINE 7.5  GLUCOSEU NEGATIVE  HGBUR NEGATIVE  BILIRUBINUR NEGATIVE  KETONESUR NEGATIVE  PROTEINUR NEGATIVE  UROBILINOGEN 0.2  NITRITE NEGATIVE  LEUKOCYTESUR NEGATIVE   Misc. Labs:  ABGS No results found for this basename: PHART,PCO2,PO2ART,TCO2,HCO3 in the last 72 hours CULTURES Recent Results (from the past 240 hour(s))  URINE CULTURE     Status: Normal   Collection Time   09/07/12  1:12 PM      Component Value Range Status Comment   Specimen Description URINE, CLEAN CATCH   Final    Special Requests NONE   Final    Culture  Setup Time 09/08/2012 20:47   Final  Colony Count NO GROWTH   Final    Culture NO GROWTH   Final    Report Status 09/09/2012 FINAL   Final    Studies/Results: No results found.  Medications:  Prior to Admission:  Prescriptions prior to admission  Medication Sig Dispense Refill  . amLODipine (NORVASC) 10 MG tablet Take 10 mg by mouth daily.      Marland Kitchen aspirin 81 MG tablet Take 1 tablet (81 mg total) by mouth daily.      Marland Kitchen atorvastatin (LIPITOR) 40 MG tablet Take 1 tablet (40  mg total) by mouth daily at 6 PM.  30 tablet  6  . benazepril (LOTENSIN) 20 MG tablet Take 20 mg by mouth 2 (two) times daily.      . Bromfenac Sodium (BROMDAY) 0.09 % SOLN Place 1 drop into the right eye 3 (three) times daily.      Marland Kitchen doxepin (SINEQUAN) 25 MG capsule Take 25 mg by mouth at bedtime.      Marland Kitchen estradiol (ESTRACE) 0.5 MG tablet Take 0.5 mg by mouth daily.        . fish oil-omega-3 fatty acids 1000 MG capsule Take 1 g by mouth 2 (two) times daily.       . furosemide (LASIX) 40 MG tablet Take 2 tablets (80 mg total) by mouth 2 (two) times daily.  120 tablet  3  . gabapentin (NEURONTIN) 300 MG capsule Take 300 mg by mouth 3 (three) times daily.      . hydrALAZINE (APRESOLINE) 25 MG tablet Take 1 tablet (25 mg total) by mouth every 8 (eight) hours.  90 tablet  6  . ketoprofen (ORUDIS) 50 MG capsule Take 50 mg by mouth 3 (three) times daily.      . metFORMIN (GLUCOPHAGE) 500 MG tablet Take 500 mg by mouth 2 (two) times daily with a meal.       . metoCLOPramide (REGLAN) 10 MG tablet Take 10 mg by mouth 4 (four) times daily.       . metoprolol tartrate (LOPRESSOR) 25 MG tablet Take 0.5 tablets (12.5 mg total) by mouth 2 (two) times daily.  30 tablet  6  . ondansetron (ZOFRAN-ODT) 8 MG disintegrating tablet Take 8 mg by mouth every 8 (eight) hours as needed. Nausea and Vomiting      . phenytoin (DILANTIN) 100 MG ER capsule Take 300 mg by mouth at bedtime.        . potassium chloride (KLOR-CON) 10 MEQ CR tablet Take 10 mEq by mouth daily.      Marland Kitchen senna-docusate (SENOKOT-S) 8.6-50 MG per tablet Take 1 tablet by mouth daily as needed.      . nitroGLYCERIN (NITROSTAT) 0.4 MG SL tablet Place 1 tablet (0.4 mg total) under the tongue every 5 (five) minutes as needed for chest pain (Up to 3 doses).  25 tablet  3   Scheduled:   . amLODipine  10 mg Oral Daily  . atorvastatin  40 mg Oral q1800  . benazepril  20 mg Oral BID  . doxepin  25 mg Oral QHS  . gabapentin  300 mg Oral TID  . hydrALAZINE  25  mg Oral Q8H  . insulin aspart  0-20 Units Subcutaneous TID WC  . insulin aspart  0-5 Units Subcutaneous QHS  . metoCLOPramide (REGLAN) injection  10 mg Intravenous Q6H  . metoprolol tartrate  12.5 mg Oral BID  . pantoprazole (PROTONIX) IV  40 mg Intravenous Q24H  . phenytoin  300 mg Oral  QHS   Continuous:   . 0.9 % NaCl with KCl 20 mEq / L 75 mL/hr at 09/09/12 1840   ZOX:[id number], acetaminophen, alum & mag hydroxide-simeth, nitroGLYCERIN, ondansetron (ZOFRAN) IV, ondansetron, promethazine  Assesment: She is admitted with nausea and vomiting and was hypokalemic. She has been on anti-inflammatory agents again apparently from her orthopedist. She is set for EGD and is improved. She is not having any overt symptoms or signs of congestive heart failure. She has not had any seizures. Her dehydration is better. She is not having any chest pain Principal Problem:  *Nausea & vomiting Active Problems:  HYPERTENSION, BENIGN  CAD (coronary artery disease)  Gastroparesis  SEIZURE DISORDER  Chronic diastolic heart failure  COPD (chronic obstructive pulmonary disease)  Dehydration  Hypokalemia    Plan: EGD today continue with other treatments    LOS: 3 days   Shameria Trimarco L 09/10/2012, 8:25 AM

## 2012-09-10 NOTE — Op Note (Signed)
[facility] [address] [place], [address]   ENDOSCOPY PROCEDURE REPORT  PATIENT: [name], [name]  MR#: [id number] BIRTHDATE: Jun 12, 1941 , 71  yrs. old GENDER: Female ENDOSCOPIST: R.  Roetta Sessions, MD FACP [facility] REFERRED BY:  Kari Baars, M.D. PROCEDURE DATE:  09/10/2012 PROCEDURE:    EGD with gastric biopsy  INDICATIONS: nausea vomiting abdominal pain  INFORMED CONSENT:   The risks, benefits, limitations, alternatives and imponderables have been discussed.  The potential for biopsy, esophogeal dilation, etc. have also been reviewed.  Questions have been answered.  All parties agreeable.  Please see the history and physical in the medical record for more information.  MEDICATIONS:  Versed 3 mg IV and Demerol 50 mg IV in divided doses.  DESCRIPTION OF PROCEDURE:   The EG-[id number] ([id number])  endoscope was introduced through the mouth and advanced to the second portion of the duodenum without difficulty or limitations.  The mucosal surfaces were surveyed very carefully during advancement of the scope and upon withdrawal.  Retroflexion view of the proximal stomach and esophagogastric junction was performed.      FINDINGS: noncritical Schatzki's ring; otherwise normal-appearing esophagus. Stomach empty. Small hiatal hernia. Diffuse gastric erosions and a 7 mm area of healing ulceration along the greater curvature. Please see image 3. No infiltrating process observed. Pylorus patent. Examination bulb and second portion revealed no abnormalities   THERAPEUTIC / DIAGNOSTIC MANEUVERS PERFORMED:  biopsies of the inflamed gastric mucosa including the area of ulceration taken for histologic study   COMPLICATIONS:  None  IMPRESSION:  Noncritical Schatzki's ring. Diffuse gastric erosions and an  area of partially healing ulceration-status post biopsy. Small hiatal hernia.  RECOMMENDATIONS: Stop ketoprofen. Increase protonix 40 mg orally twice daily. Resume  oral Reglan at a low dose i.e. 5 mg a.c. and at bedtime. Feel benefits outweigh the risks at this time. Followup on pathology. Gastroparesis diet. Resume Lovenox tomorrow.    _______________________________ R. Roetta Sessions, MD FACP [facility] eSigned:  R. Roetta Sessions, MD FACP [facility] 09/10/2012 11:54 AM     CC:  PATIENT NAME:  Eddy, Jairam MR#: [id number]

## 2012-09-11 DIAGNOSIS — K3184 Gastroparesis: Secondary | ICD-10-CM

## 2012-09-11 DIAGNOSIS — K279 Peptic ulcer, site unspecified, unspecified as acute or chronic, without hemorrhage or perforation: Secondary | ICD-10-CM

## 2012-09-11 LAB — GLUCOSE, CAPILLARY
Glucose-Capillary: 120 mg/dL — ABNORMAL HIGH (ref 70–99)
Glucose-Capillary: 123 mg/dL — ABNORMAL HIGH (ref 70–99)

## 2012-09-11 MED ORDER — HYDRALAZINE HCL 25 MG PO TABS
50.0000 mg | ORAL_TABLET | Freq: Three times a day (TID) | ORAL | Status: DC
  Administered 2012-09-11 – 2012-09-13 (×7): 50 mg via ORAL
  Filled 2012-09-11: qty 2
  Filled 2012-09-11: qty 1
  Filled 2012-09-11 (×5): qty 2

## 2012-09-11 MED ORDER — SODIUM CHLORIDE 0.9 % IJ SOLN
INTRAMUSCULAR | Status: AC
  Administered 2012-09-11: 19:00:00
  Filled 2012-09-11: qty 3

## 2012-09-11 NOTE — Progress Notes (Signed)
UR Chart Review Completed  

## 2012-09-11 NOTE — Care Management Note (Signed)
    Page 1 of 2   09/13/2012     2:01:21 PM   CARE MANAGEMENT NOTE 09/13/2012  Patient:  [name],[name]   Account Number:  [id number]  Date Initiated:  09/11/2012  Documentation initiated by:  Sharrie Rothman  Subjective/Objective Assessment:   Pt admitted from home with vomiting and abd pain.  Pt lives with her husband and wants to return home at discharge. Pt uses a quad cane for assistance with ambulation.     Action/Plan:   Pt will benefit from [facility] at discharge. PT consult for possible SNF placement.   Anticipated DC Date:  09/13/2012   Anticipated DC Plan:  HOME W HOME HEALTH SERVICES  In-house referral  Clinical Social Worker      DC Planning Services  CM consult      [facility] Choice  HOME HEALTH   Choice offered to / List presented to:  [name]        HH arranged  HH-1 RN  HH-2 PT      [facility] agency  [facility]   Status of service:  Completed, signed off Medicare Important Message given?  YES (If response is "NO", the following Medicare IM given date fields will be blank) Date Medicare IM given:  09/13/2012 Date Additional Medicare IM given:    Discharge Disposition:    Per UR Regulation:    If discussed at Long Length of Stay Meetings, dates discussed:   09/13/2012    Comments:  09/13/12 1400 Arlyss Queen, RN BSN CM Pt discharged home today with [facility] RN and PT. Alroy Bailiff of [facility] is aware and will  collect the pts information from the chart. HH services will start within 48 hours of discharge. No DME needs noted. Pt and pts nurse aware of discharge arrangements.  09/11/12 1135 Arlyss Queen, RN BSN CM

## 2012-09-11 NOTE — Progress Notes (Signed)
1 attempt  Unsuccessful  Rt  Hand for  Perp. Iv

## 2012-09-11 NOTE — Progress Notes (Signed)
PT CONFUSED. STATES NO VOMITING. HOLD REGLAN. MONITOR MS. ADVANCE DIET.  REVIEWED.

## 2012-09-11 NOTE — Progress Notes (Signed)
Subjective: She apparently slid off the bed last night with no injury. Her EGD showed gastritis. She says she feels somewhat better. She still does not feel like eating. She was very confused yesterday after EGD  Objective: Vital signs in last 24 hours: Temp:  [97.8 F (36.6 C)-99.4 F (37.4 C)] 98 F (36.7 C) (10/08 0523) Pulse Rate:  [40-74] 71  (10/08 0523) Resp:  [20-28] 20  (10/08 0523) BP: (161-219)/(80-109) 166/107 mmHg (10/08 0523) SpO2:  [91 %-98 %] 96 % (10/08 0523) Weight:  [101.288 kg (223 lb 4.8 oz)-102.513 kg (226 lb)] 101.288 kg (223 lb 4.8 oz) (10/08 0523) Weight change: -0.363 kg (-12.8 oz) Last BM Date: 09/10/12  Intake/Output from previous day: 10/07 0701 - 10/08 0700 In: 2036.3 [P.O.:220; I.V.:1816.3] Out: 1750 [Urine:1750]  PHYSICAL EXAM General appearance: alert, cooperative, mild distress and morbidly obese Resp: clear to auscultation bilaterally Cardio: irregularly irregular rhythm GI: Minimally diffusely tender Extremities: extremities normal, atraumatic, no cyanosis or edema  Lab Results:    Basic Metabolic Panel:  Basename 09/09/12 0720  NA 144  K 3.5  CL 105  CO2 26  GLUCOSE 131*  BUN 8  CREATININE 1.00  CALCIUM 9.0  MG --  PHOS --   Liver Function Tests: No results found for this basename: AST:2,ALT:2,ALKPHOS:2,BILITOT:2,PROT:2,ALBUMIN:2 in the last 72 hours No results found for this basename: LIPASE:2,AMYLASE:2 in the last 72 hours No results found for this basename: AMMONIA:2 in the last 72 hours CBC:  Basename 09/09/12 0720  WBC 16.9*  NEUTROABS 14.4*  HGB 9.9*  HCT 31.1*  MCV 86.9  PLT 261   Cardiac Enzymes: No results found for this basename: CKTOTAL:3,CKMB:3,CKMBINDEX:3,TROPONINI:3 in the last 72 hours BNP: No results found for this basename: PROBNP:3 in the last 72 hours D-Dimer: No results found for this basename: DDIMER:2 in the last 72 hours CBG:  Basename 09/11/12 0754 09/10/12 2124 09/10/12 1623 09/10/12  1105 09/10/12 0736 09/09/12 2013  GLUCAP 120* 101* 119* 110* 130* 127*   Hemoglobin A1C: No results found for this basename: HGBA1C in the last 72 hours Fasting Lipid Panel: No results found for this basename: CHOL,HDL,LDLCALC,TRIG,CHOLHDL,LDLDIRECT in the last 72 hours Thyroid Function Tests: No results found for this basename: TSH,T4TOTAL,FREET4,T3FREE,THYROIDAB in the last 72 hours Anemia Panel: No results found for this basename: VITAMINB12,FOLATE,FERRITIN,TIBC,IRON,RETICCTPCT in the last 72 hours Coagulation: No results found for this basename: LABPROT:2,INR:2 in the last 72 hours Urine Drug Screen: Drugs of Abuse  No results found for this basename: labopia, cocainscrnur, labbenz, amphetmu, thcu, labbarb    Alcohol Level: No results found for this basename: ETH:2 in the last 72 hours Urinalysis: No results found for this basename: COLORURINE:2,APPERANCEUR:2,LABSPEC:2,PHURINE:2,GLUCOSEU:2,HGBUR:2,BILIRUBINUR:2,KETONESUR:2,PROTEINUR:2,UROBILINOGEN:2,NITRITE:2,LEUKOCYTESUR:2 in the last 72 hours Misc. Labs:  ABGS No results found for this basename: PHART,PCO2,PO2ART,TCO2,HCO3 in the last 72 hours CULTURES Recent Results (from the past 240 hour(s))  URINE CULTURE     Status: Normal   Collection Time   09/07/12  1:12 PM      Component Value Range Status Comment   Specimen Description URINE, CLEAN CATCH   Final    Special Requests NONE   Final    Culture  Setup Time 09/08/2012 20:47   Final    Colony Count NO GROWTH   Final    Culture NO GROWTH   Final    Report Status 09/09/2012 FINAL   Final    Studies/Results: No results found.  Medications:  Prior to Admission:  Prescriptions prior to admission  Medication Sig Dispense Refill  .  amLODipine (NORVASC) 10 MG tablet Take 10 mg by mouth daily.      Marland Kitchen aspirin 81 MG tablet Take 1 tablet (81 mg total) by mouth daily.      Marland Kitchen atorvastatin (LIPITOR) 40 MG tablet Take 1 tablet (40 mg total) by mouth daily at 6 PM.  30 tablet  6    . benazepril (LOTENSIN) 20 MG tablet Take 20 mg by mouth 2 (two) times daily.      . Bromfenac Sodium (BROMDAY) 0.09 % SOLN Place 1 drop into the right eye 3 (three) times daily.      Marland Kitchen doxepin (SINEQUAN) 25 MG capsule Take 25 mg by mouth at bedtime.      Marland Kitchen estradiol (ESTRACE) 0.5 MG tablet Take 0.5 mg by mouth daily.        . fish oil-omega-3 fatty acids 1000 MG capsule Take 1 g by mouth 2 (two) times daily.       . furosemide (LASIX) 40 MG tablet Take 2 tablets (80 mg total) by mouth 2 (two) times daily.  120 tablet  3  . gabapentin (NEURONTIN) 300 MG capsule Take 300 mg by mouth 3 (three) times daily.      . hydrALAZINE (APRESOLINE) 25 MG tablet Take 1 tablet (25 mg total) by mouth every 8 (eight) hours.  90 tablet  6  . ketoprofen (ORUDIS) 50 MG capsule Take 50 mg by mouth 3 (three) times daily.      . metFORMIN (GLUCOPHAGE) 500 MG tablet Take 500 mg by mouth 2 (two) times daily with a meal.       . metoCLOPramide (REGLAN) 10 MG tablet Take 10 mg by mouth 4 (four) times daily.       . metoprolol tartrate (LOPRESSOR) 25 MG tablet Take 0.5 tablets (12.5 mg total) by mouth 2 (two) times daily.  30 tablet  6  . ondansetron (ZOFRAN-ODT) 8 MG disintegrating tablet Take 8 mg by mouth every 8 (eight) hours as needed. Nausea and Vomiting      . phenytoin (DILANTIN) 100 MG ER capsule Take 300 mg by mouth at bedtime.        . potassium chloride (KLOR-CON) 10 MEQ CR tablet Take 10 mEq by mouth daily.      Marland Kitchen senna-docusate (SENOKOT-S) 8.6-50 MG per tablet Take 1 tablet by mouth daily as needed.      . nitroGLYCERIN (NITROSTAT) 0.4 MG SL tablet Place 1 tablet (0.4 mg total) under the tongue every 5 (five) minutes as needed for chest pain (Up to 3 doses).  25 tablet  3   Scheduled:   . amLODipine  10 mg Oral Daily  . atorvastatin  40 mg Oral q1800  . benazepril  20 mg Oral BID  . doxepin  25 mg Oral QHS  . enoxaparin (LOVENOX) injection  40 mg Subcutaneous Q24H  . gabapentin  300 mg Oral TID  .  hydrALAZINE  50 mg Oral Q8H  . insulin aspart  0-20 Units Subcutaneous TID WC  . insulin aspart  0-5 Units Subcutaneous QHS  . meperidine      . metoCLOPramide  5 mg Oral TID AC  . metoprolol tartrate  12.5 mg Oral BID  . midazolam      . pantoprazole  40 mg Oral Q1200  . phenytoin  300 mg Oral QHS  . DISCONTD: hydrALAZINE  25 mg Oral Q8H  . DISCONTD: metoCLOPramide (REGLAN) injection  10 mg Intravenous Q6H  . DISCONTD: pantoprazole (PROTONIX) IV  40  mg Intravenous Q24H   Continuous:   . 0.9 % NaCl with KCl 20 mEq / L 75 mL/hr at 09/10/12 1319  . DISCONTD: sodium chloride 20 mL/hr at 09/10/12 1124  . DISCONTD: sodium chloride     ZOX:[id number], acetaminophen, alum & mag hydroxide-simeth, nitroGLYCERIN, ondansetron (ZOFRAN) IV, ondansetron, promethazine, traMADol, DISCONTD: meperidine, DISCONTD: midazolam, DISCONTD: simethicone susp in sterile water 1000 mL irrigation  Assesment: She was admitted with nausea and vomiting. She was hypokalemic and dehydrated. All of that has improved. She has what appears to be gastritis based on her EGD. She was confused which is improved. I think she is very deconditioned. She has chronic chest wall pain and multiple orthopedic abnormalities and had been maintained on an anti-inflammatory by her orthopedist which I think we need to try to keep her off of Principal Problem:  *Nausea & vomiting Active Problems:  HYPERTENSION, BENIGN  CAD (coronary artery disease)  Gastroparesis  SEIZURE DISORDER  Chronic diastolic heart failure  COPD (chronic obstructive pulmonary disease)  Dehydration  Hypokalemia    Plan: Physical therapy consultation today.    LOS: 4 days   Graelyn Bihl L 09/11/2012, 8:30 AM

## 2012-09-11 NOTE — Progress Notes (Signed)
Patients bed alarm was going off, the nurse tech promptly responded to the alarm and found the patient sitting in the floor at the end of the bed.  The patient stated that she was not hurt and that she did not hit her head, that she slid out of the bed on to her bottom while trying to get up.  The on call MD, the charge nurse, and the [facility] were notified.  No new orders at this time.  Reeducated the patient on the call bell.  Will notify the day nurse and the day charge nurse that the patient needs to be relocated to a camera room closer to the nurses desk as soon as available.  Patient resting comfortably at this time, will continue to monitor.

## 2012-09-11 NOTE — Evaluation (Signed)
Physical Therapy Evaluation Patient Details Name: [name] MRN: [id number] DOB: 01-13-1941 Today's Date: 09/11/2012 Time: 4098-1191 PT Time Calculation (min): 58 min  PT Assessment / Plan / Recommendation Clinical Impression  Pt is found to be deconditioned and needs min to SBA for all activity/gait.  She is very cooperative, but confused and is unable to give me an accurate picture of her home setting.  She is mobile enough to function in a home setting as long as there is full time support.  Since I don't know how much help she has , it is not yet clear as to her d/c disposition.        PT Assessment  Patient needs continued PT services    Follow Up Recommendations  Other (comment) (to be determined)    Does the patient have the potential to tolerate intense rehabilitation      Barriers to Discharge        Equipment Recommendations  None recommended by PT    Recommendations for Other Services     Frequency Min 3X/week    Precautions / Restrictions Precautions Precautions: Fall Restrictions Weight Bearing Restrictions: No   Pertinent Vitals/Pain       Mobility  Bed Mobility Bed Mobility: Supine to Sit;Sit to Supine Supine to Sit: HOB flat;4: Min assist Sit to Supine: Not Tested (comment);HOB flat Details for Bed Mobility Assistance: pt immediately wants assist to pull trunk up OOB...her HOB is flat at home, so she was instructed to try to use UEs to push herself up...still needed some assist Transfers Transfers: Sit to Stand;Stand to Sit Sit to Stand: 5: Supervision;From bed;With upper extremity assist Stand to Sit: 5: Supervision;With upper extremity assist;To chair/3-in-1;To bed Ambulation/Gait Ambulation/Gait Assistance: 5: Supervision Ambulation Distance (Feet): 60 Feet Assistive device: Rolling walker Ambulation/Gait Assistance Details: pt has a quad cane in her room, but she states that she usually uses a walker for gait.Marland KitchenMarland KitchenI did not test her gait with  cane Gait Pattern: Trunk flexed;Shuffle Gait velocity: WNL, but dyspneic (O2 sat = 97%) Stairs: No Wheelchair Mobility Wheelchair Mobility: No    Shoulder Instructions     Exercises General Exercises - Lower Extremity Ankle Circles/Pumps: AROM;Strengthening;Both;10 reps;Supine Heel Slides: AROM;Strengthening;Both;10 reps;Supine Hip ABduction/ADduction: AROM;Strengthening;Both;10 reps;Supine   PT Diagnosis: Difficulty walking;Generalized weakness  PT Problem List: Decreased strength;Decreased activity tolerance;Decreased mobility;Decreased cognition;Cardiopulmonary status limiting activity;Obesity PT Treatment Interventions: Gait training;Functional mobility training   PT Goals Acute Rehab PT Goals PT Goal Formulation: With patient Time For Goal Achievement: 09/25/12 Potential to Achieve Goals: Good Pt will go Supine/Side to Sit: with modified independence;with HOB 0 degrees PT Goal: Supine/Side to Sit - Progress: Goal set today Pt will go Sit to Stand: with modified independence;with upper extremity assist PT Goal: Sit to Stand - Progress: Goal set today Pt will go Stand to Sit: with modified independence;with upper extremity assist PT Goal: Stand to Sit - Progress: Goal set today Pt will Ambulate: 51 - 150 feet;with supervision;with least restrictive assistive device PT Goal: Ambulate - Progress: Goal set today  Visit Information  Last PT Received On: 09/11/12    Subjective Data  Subjective: no c/o Patient Stated Goal: none stated   Prior Functioning  Home Living Lives With: Spouse Additional Comments: pt is very confused and gives me different scenarios about her home setting each time I ask her about it.  I do not know answers to above Prior Function Vocation: Retired Comments: unknown Musician: No difficulties    Cognition  Overall  Cognitive Status: Impaired Area of Impairment: Memory Arousal/Alertness: Awake/alert Orientation Level: Appears  intact for tasks assessed Behavior During Session: [facility] for tasks performed Memory Deficits: pt cannot recall her living situation or any details about it    Extremity/Trunk Assessment Right Lower Extremity Assessment RLE ROM/Strength/Tone: WFL for tasks assessed RLE Sensation: WFL - Light Touch Left Lower Extremity Assessment LLE ROM/Strength/Tone: WFL for tasks assessed LLE Sensation: WFL - Light Touch Trunk Assessment Trunk Assessment: Kyphotic   Balance Balance Balance Assessed: No  End of Session PT - End of Session Equipment Utilized During Treatment: Gait belt Activity Tolerance: Patient tolerated treatment well Patient left: in chair;with call bell/phone within reach;with chair alarm set Nurse Communication: Mobility status  GP     Konrad Penta 09/11/2012, 5:15 PM

## 2012-09-11 NOTE — Progress Notes (Signed)
Pt lost IV access, charge nurse attempted, as well as another floor RN, no success, passed on to night shift for [facility] to try. Sheryn Bison

## 2012-09-11 NOTE — Progress Notes (Signed)
Subjective: Pt notes several loose diarrheal stools last night.  C/o mid-abdominal "soreness".  +nausea, no vomiting.    Objective: Vital signs in last 24 hours: Temp:  [97.8 F (36.6 C)-99.1 F (37.3 C)] 98 F (36.7 C) (10/08 0523) Pulse Rate:  [40-74] 71  (10/08 0523) Resp:  [20-28] 20  (10/08 0523) BP: (161-219)/(80-109) 166/107 mmHg (10/08 0523) SpO2:  [91 %-98 %] 96 % (10/08 0523) Weight:  [223 lb 4.8 oz (101.288 kg)-226 lb (102.513 kg)] 223 lb 4.8 oz (101.288 kg) (10/08 0523) Last BM Date: 09/10/12 No LMP recorded. Patient has had a hysterectomy. Body mass index is 40.84 kg/(m^2). General:   Alert, pleasant and cooperative in NAD. Eyes:  Sclera clear, no icterus.   Conjunctiva pink. Mouth:  oropharynx pink & moist. Heart:  Regular rate and rhythm Abdomen:   Normal bowel sounds.  Soft, nontender and nondistended.  Extremities:  Trace ankle edema bilaterally. Neurologic:  Alert and  oriented x4;  grossly normal neurologically. Skin:  Intact without significant lesions or rashes. Cervical Nodes:  No significant cervical adenopathy. Psych:  Alert and cooperative. Normal mood and affect.  Intake/Output from previous day: 10/07 0701 - 10/08 0700 In: 2036.3 [P.O.:220; I.V.:1816.3] Out: 1750 [Urine:1750]  Lab Results:  [facility] 09/09/12 0720  WBC 16.9*  HGB 9.9*  HCT 31.1*  PLT 261   BMET  Basename 09/09/12 0720  NA 144  K 3.5  CL 105  CO2 26  GLUCOSE 131*  BUN 8  CREATININE 1.00  CALCIUM 9.0   Assessment: 1. Patially healed PUD:  On BID PPI 2. Gastroparesis: Improved on reglan QID 3. Diarrhea: r/o c diff.  ? Side effect due to regan  Plan: 1. C diff PCR 2. Continue reglan 5mg  ac/hs  3. Continue BID Protonix 40mg  4. No NSAIDS  LOS: 4 days   [name]  09/11/2012, 9:57 AM

## 2012-09-12 DIAGNOSIS — R112 Nausea with vomiting, unspecified: Secondary | ICD-10-CM

## 2012-09-12 LAB — CBC WITH DIFFERENTIAL/PLATELET
Basophils Absolute: 0 10*3/uL (ref 0.0–0.1)
Basophils Relative: 0 % (ref 0–1)
Lymphocytes Relative: 18 % (ref 12–46)
MCHC: 33 g/dL (ref 30.0–36.0)
Neutro Abs: 9.8 10*3/uL — ABNORMAL HIGH (ref 1.7–7.7)
Neutrophils Relative %: 72 % (ref 43–77)
Platelets: 243 10*3/uL (ref 150–400)
RDW: 14 % (ref 11.5–15.5)
WBC: 13.7 10*3/uL — ABNORMAL HIGH (ref 4.0–10.5)

## 2012-09-12 LAB — GLUCOSE, CAPILLARY
Glucose-Capillary: 116 mg/dL — ABNORMAL HIGH (ref 70–99)
Glucose-Capillary: 141 mg/dL — ABNORMAL HIGH (ref 70–99)

## 2012-09-12 LAB — BASIC METABOLIC PANEL
Chloride: 103 mEq/L (ref 96–112)
Creatinine, Ser: 1.21 mg/dL — ABNORMAL HIGH (ref 0.50–1.10)
GFR calc Af Amer: 50 mL/min — ABNORMAL LOW (ref >90)
Potassium: 2.5 mEq/L — CL (ref 3.5–5.1)
Sodium: 139 mEq/L (ref 135–145)

## 2012-09-12 LAB — CLOSTRIDIUM DIFFICILE BY PCR: Toxigenic C. Difficile by PCR: NEGATIVE

## 2012-09-12 MED ORDER — POTASSIUM CHLORIDE CRYS ER 20 MEQ PO TBCR
40.0000 meq | EXTENDED_RELEASE_TABLET | ORAL | Status: AC
  Administered 2012-09-12 (×2): 40 meq via ORAL
  Filled 2012-09-12: qty 2

## 2012-09-12 MED ORDER — POTASSIUM CHLORIDE CRYS ER 20 MEQ PO TBCR
40.0000 meq | EXTENDED_RELEASE_TABLET | ORAL | Status: DC
  Administered 2012-09-12 (×2): 40 meq via ORAL
  Filled 2012-09-12 (×3): qty 2

## 2012-09-12 NOTE — Progress Notes (Signed)
Subjective: She is much improved from yesterday. When I saw her yesterday I was very concerned that she might require nursing home placement. Today however she is back to her normal mental status is awake and alert and appears much more energetic  Objective: Vital signs in last 24 hours: Temp:  [98.1 F (36.7 C)-98.7 F (37.1 C)] 98.1 F (36.7 C) (10/09 0526) Pulse Rate:  [65-79] 65  (10/09 0526) Resp:  [20] 20  (10/09 0526) BP: (164-172)/(84-100) 171/100 mmHg (10/09 0526) SpO2:  [94 %-96 %] 96 % (10/09 0526) Weight:  [99.4 kg (219 lb 2.2 oz)] 99.4 kg (219 lb 2.2 oz) (10/09 0526) Weight change: -3.113 kg (-6 lb 13.8 oz) Last BM Date: 09/11/12  Intake/Output from previous day: 10/08 0701 - 10/09 0700 In: 360 [P.O.:360] Out: 300 [Urine:300]  PHYSICAL EXAM General appearance: alert, cooperative and morbidly obese Resp: clear to auscultation bilaterally Cardio: irregularly irregular rhythm GI: soft, non-tender; bowel sounds normal; no masses,  no organomegaly Extremities: extremities normal, atraumatic, no cyanosis or edema  Lab Results:    Basic Metabolic Panel: No results found for this basename: NA:2,K:2,CL:2,CO2:2,GLUCOSE:2,BUN:2,CREATININE:2,CALCIUM:2,MG:2,PHOS:2 in the last 72 hours Liver Function Tests: No results found for this basename: AST:2,ALT:2,ALKPHOS:2,BILITOT:2,PROT:2,ALBUMIN:2 in the last 72 hours No results found for this basename: LIPASE:2,AMYLASE:2 in the last 72 hours No results found for this basename: AMMONIA:2 in the last 72 hours CBC: No results found for this basename: WBC:2,NEUTROABS:2,HGB:2,HCT:2,MCV:2,PLT:2 in the last 72 hours Cardiac Enzymes: No results found for this basename: CKTOTAL:3,CKMB:3,CKMBINDEX:3,TROPONINI:3 in the last 72 hours BNP: No results found for this basename: PROBNP:3 in the last 72 hours D-Dimer: No results found for this basename: DDIMER:2 in the last 72 hours CBG:  Basename 09/12/12 0736 09/11/12 2213 09/11/12 1646  09/11/12 1133 09/11/12 0754 09/10/12 2124  GLUCAP 123* 123* 118* 114* 120* 101*   Hemoglobin A1C: No results found for this basename: HGBA1C in the last 72 hours Fasting Lipid Panel: No results found for this basename: CHOL,HDL,LDLCALC,TRIG,CHOLHDL,LDLDIRECT in the last 72 hours Thyroid Function Tests: No results found for this basename: TSH,T4TOTAL,FREET4,T3FREE,THYROIDAB in the last 72 hours Anemia Panel: No results found for this basename: VITAMINB12,FOLATE,FERRITIN,TIBC,IRON,RETICCTPCT in the last 72 hours Coagulation: No results found for this basename: LABPROT:2,INR:2 in the last 72 hours Urine Drug Screen: Drugs of Abuse  No results found for this basename: labopia, cocainscrnur, labbenz, amphetmu, thcu, labbarb    Alcohol Level: No results found for this basename: ETH:2 in the last 72 hours Urinalysis: No results found for this basename: COLORURINE:2,APPERANCEUR:2,LABSPEC:2,PHURINE:2,GLUCOSEU:2,HGBUR:2,BILIRUBINUR:2,KETONESUR:2,PROTEINUR:2,UROBILINOGEN:2,NITRITE:2,LEUKOCYTESUR:2 in the last 72 hours Misc. Labs:  ABGS No results found for this basename: PHART,PCO2,PO2ART,TCO2,HCO3 in the last 72 hours CULTURES Recent Results (from the past 240 hour(s))  URINE CULTURE     Status: Normal   Collection Time   09/07/12  1:12 PM      Component Value Range Status Comment   Specimen Description URINE, CLEAN CATCH   Final    Special Requests NONE   Final    Culture  Setup Time 09/08/2012 20:47   Final    Colony Count NO GROWTH   Final    Culture NO GROWTH   Final    Report Status 09/09/2012 FINAL   Final   CLOSTRIDIUM DIFFICILE BY PCR     Status: Normal   Collection Time   09/11/12  2:20 PM      Component Value Range Status Comment   C difficile by pcr NEGATIVE  NEGATIVE Final    Studies/Results: No results found.  Medications:  Prior to Admission:  Prescriptions prior to admission  Medication Sig Dispense Refill  . amLODipine (NORVASC) 10 MG tablet Take 10 mg by mouth  daily.      Marland Kitchen aspirin 81 MG tablet Take 1 tablet (81 mg total) by mouth daily.      Marland Kitchen atorvastatin (LIPITOR) 40 MG tablet Take 1 tablet (40 mg total) by mouth daily at 6 PM.  30 tablet  6  . benazepril (LOTENSIN) 20 MG tablet Take 20 mg by mouth 2 (two) times daily.      . Bromfenac Sodium (BROMDAY) 0.09 % SOLN Place 1 drop into the right eye 3 (three) times daily.      Marland Kitchen doxepin (SINEQUAN) 25 MG capsule Take 25 mg by mouth at bedtime.      Marland Kitchen estradiol (ESTRACE) 0.5 MG tablet Take 0.5 mg by mouth daily.        . fish oil-omega-3 fatty acids 1000 MG capsule Take 1 g by mouth 2 (two) times daily.       . furosemide (LASIX) 40 MG tablet Take 2 tablets (80 mg total) by mouth 2 (two) times daily.  120 tablet  3  . gabapentin (NEURONTIN) 300 MG capsule Take 300 mg by mouth 3 (three) times daily.      . hydrALAZINE (APRESOLINE) 25 MG tablet Take 1 tablet (25 mg total) by mouth every 8 (eight) hours.  90 tablet  6  . ketoprofen (ORUDIS) 50 MG capsule Take 50 mg by mouth 3 (three) times daily.      . metFORMIN (GLUCOPHAGE) 500 MG tablet Take 500 mg by mouth 2 (two) times daily with a meal.       . metoCLOPramide (REGLAN) 10 MG tablet Take 10 mg by mouth 4 (four) times daily.       . metoprolol tartrate (LOPRESSOR) 25 MG tablet Take 0.5 tablets (12.5 mg total) by mouth 2 (two) times daily.  30 tablet  6  . ondansetron (ZOFRAN-ODT) 8 MG disintegrating tablet Take 8 mg by mouth every 8 (eight) hours as needed. Nausea and Vomiting      . phenytoin (DILANTIN) 100 MG ER capsule Take 300 mg by mouth at bedtime.        . potassium chloride (KLOR-CON) 10 MEQ CR tablet Take 10 mEq by mouth daily.      Marland Kitchen senna-docusate (SENOKOT-S) 8.6-50 MG per tablet Take 1 tablet by mouth daily as needed.      . nitroGLYCERIN (NITROSTAT) 0.4 MG SL tablet Place 1 tablet (0.4 mg total) under the tongue every 5 (five) minutes as needed for chest pain (Up to 3 doses).  25 tablet  3   Scheduled:   . amLODipine  10 mg Oral Daily  .  atorvastatin  40 mg Oral q1800  . benazepril  20 mg Oral BID  . doxepin  25 mg Oral QHS  . enoxaparin (LOVENOX) injection  40 mg Subcutaneous Q24H  . gabapentin  300 mg Oral TID  . hydrALAZINE  50 mg Oral Q8H  . insulin aspart  0-20 Units Subcutaneous TID WC  . insulin aspart  0-5 Units Subcutaneous QHS  . metoprolol tartrate  12.5 mg Oral BID  . pantoprazole  40 mg Oral Q1200  . phenytoin  300 mg Oral QHS  . sodium chloride      . DISCONTD: metoCLOPramide  5 mg Oral TID AC   Continuous:   . 0.9 % NaCl with KCl 20 mEq / Wolf 75 mL/hr at 09/10/12  1319   AVW:[id number], acetaminophen, alum & mag hydroxide-simeth, nitroGLYCERIN, ondansetron (ZOFRAN) IV, ondansetron, promethazine, traMADol  Assesment: He was admitted with nausea and vomiting and has what appears to be gastritis. She was very confused which may be a combination of medications and perhaps from Reglan. She is better. Principal Problem:  *Nausea & vomiting Active Problems:  HYPERTENSION, BENIGN  CAD (coronary artery disease)  Gastroparesis  SEIZURE DISORDER  Chronic diastolic heart failure  COPD (chronic obstructive pulmonary disease)  Dehydration  Hypokalemia    Plan: I would like to do another day of physical therapy make sure that her mental status holds and that she is safe to be at home and probable discharge in the morning    LOS: 5 days   [name] 09/12/2012, 8:50 AM

## 2012-09-12 NOTE — Progress Notes (Signed)
Subjective: Denies N/V, tolerated breakfast. No abdominal pain. Confusion better. 2-3 loose stools per day.   Objective: Vital signs in last 24 hours: Temp:  [98.1 F (36.7 C)-98.7 F (37.1 C)] 98.1 F (36.7 C) (10/09 0526) Pulse Rate:  [65-79] 65  (10/09 0526) Resp:  [20] 20  (10/09 0526) BP: (164-172)/(84-100) 171/100 mmHg (10/09 0526) SpO2:  [94 %-96 %] 96 % (10/09 0526) Weight:  [219 lb 2.2 oz (99.4 kg)] 219 lb 2.2 oz (99.4 kg) (10/09 0526) Last BM Date: 09/11/12 General:   Alert and oriented to person, place Head:  Normocephalic and atraumatic. Eyes:  No icterus, sclera clear. Conjuctiva pink.  Heart:  S1, S2 present, no murmurs noted.  Lungs: Clear to auscultation bilaterally, without wheezing, rales, or rhonchi.  Abdomen:  Bowel sounds present, soft, non-tender, non-distended. No HSM or hernias noted. No rebound or guarding. No masses appreciated  Pulses:  Normal pulses noted. Extremities:  Without clubbing or edema. Neurologic:  Alert and  oriented x4;  grossly normal neurologically. Psych:  Alert and cooperative. Normal mood and affect.  Intake/Output from previous day: 10/08 0701 - 10/09 0700 In: 360 [P.O.:360] Out: 300 [Urine:300] Intake/Output this shift:     Assessment: 71 year old female with hx of diabetic gastroparesis, admitted with N/V. EGD with non-critical Schatzki's ring, diffuse gastric erosions and partially healing ulceration s/p biopsy. Reglan had been started but now stopped due to confusion. Confusion improving. Loose stools, 2-3 per day, with negative Cdiff. (last colonoscopy 2006). Overall, clinically improved from admission.   Plan: Continue PPI BID Hold Reglan for now Follow-up on biopsies Anticipate d/c in near future   LOS: 5 days   Gerrit Halls  09/12/2012, 7:44 AM   Patient appears to be intolerant of even low-dose Reglan. Agree with continuing twice daily proton pump inhibitor therapy. Would avoid nonsteroidal agents in the future as  much as possible. Gastroparesis diet. Follow up on biopsies.

## 2012-09-12 NOTE — Progress Notes (Signed)
CRITICAL VALUE ALERT  Critical value received: potassium 2.5  Date of notification: 09/12/2012  Time of notification: 10:48  Critical value read back:yes  Nurse who received alert:  Candelaria Stagers  MD notified (1st page):  Juanetta Gosling  Time of first page:  10:48  MD notified (2nd page):  Time of second page:  10:55  Responding MD:  Juanetta Gosling  Time MD responded:  11:00

## 2012-09-12 NOTE — Progress Notes (Signed)
Physical Therapy Treatment Patient Details Name: [name] MRN: [id number] DOB: 1941/09/11 Today's Date: 09/12/2012 Time: 6644-0347 PT Time Calculation (min): 26 min  PT Assessment / Plan / Recommendation Comments on Treatment Session  Pt very coorperative and followed all instructions without difficulty.  Pt explained importance of hand placement for safety with sit to stands and able to demonstrate with min cueing.  Increased ambulation distance with no cueing required for posture or to increase stride length.  Pt limited by fatigue at end of session.  Pt left in chair with phone and call bell within reach and chair alarm set.      Follow Up Recommendations        Does the patient have the potential to tolerate intense rehabilitation     Barriers to Discharge        Equipment Recommendations       Recommendations for Other Services    Frequency     Plan      Precautions / Restrictions Precautions Precautions: Fall       Mobility  Transfers Transfers: Sit to Stand;Stand to Sit (5 reps for safe hand placements and LE strengthening) Sit to Stand: 5: Supervision;With upper extremity assist Stand to Sit: 5: Supervision;With upper extremity assist Ambulation/Gait Ambulation/Gait Assistance: 5: Supervision Ambulation Distance (Feet): 85 Feet Assistive device: Rolling walker Ambulation/Gait Assistance Details: pt has quad cane in her room but stated she felt safer with walker Gait Pattern: Within Functional Limits    Exercises Total Joint Exercises Ankle Circles/Pumps: AROM;Both;15 reps;Seated Hip ABduction/ADduction: AROM;Both;10 reps;Strengthening Long Arc Quad: AROM;Strengthening;Both;10 reps General Exercises - Upper Extremity Shoulder Flexion: AROM;Strengthening;10 reps;Both Shoulder ABduction: AROM;Strengthening;Both;10 reps General Exercises - Lower Extremity Hip Flexion/Marching: AROM;Strengthening;Both;10 reps;Seated Mini-Sqauts: AROM;Strengthening;10 reps    PT Diagnosis:    PT Problem List:   PT Treatment Interventions:     PT Goals Acute Rehab PT Goals PT Goal: Sit to Stand - Progress: Met PT Goal: Stand to Sit - Progress: Met PT Goal: Ambulate - Progress: Progressing toward goal  Visit Information  Last PT Received On: 09/12/12    Subjective Data  Subjective: Pt reported no real pain besides her R arm from IV.  Pt stated she has husband and family living with her for support.   Cognition  Overall Cognitive Status: Impaired Area of Impairment: Memory Arousal/Alertness: Awake/alert Orientation Level: Appears intact for tasks assessed Behavior During Session: [facility] for tasks performed    Balance     End of Session PT - End of Session Equipment Utilized During Treatment: Gait belt Activity Tolerance: Patient tolerated treatment well Patient left: in chair;with call bell/phone within reach;with chair alarm set Nurse Communication: Mobility status   GP     Juel Burrow 09/12/2012, 11:00 AM

## 2012-09-13 ENCOUNTER — Telehealth: Payer: Self-pay | Admitting: Gastroenterology

## 2012-09-13 ENCOUNTER — Encounter: Payer: Self-pay | Admitting: Internal Medicine

## 2012-09-13 DIAGNOSIS — R112 Nausea with vomiting, unspecified: Secondary | ICD-10-CM

## 2012-09-13 DIAGNOSIS — K3184 Gastroparesis: Secondary | ICD-10-CM

## 2012-09-13 LAB — BASIC METABOLIC PANEL
Calcium: 9.2 mg/dL (ref 8.4–10.5)
GFR calc non Af Amer: 51 mL/min — ABNORMAL LOW (ref >90)
Sodium: 139 mEq/L (ref 135–145)

## 2012-09-13 LAB — GLUCOSE, CAPILLARY
Glucose-Capillary: 95 mg/dL (ref 70–99)
Glucose-Capillary: 116 mg/dL — ABNORMAL HIGH (ref 70–99)
Glucose-Capillary: 109 mg/dL — ABNORMAL HIGH (ref 70–99)

## 2012-09-13 NOTE — Progress Notes (Signed)
Subjective:  Patient wants to go home. Plans if potassium is better but lab has not been able to get any blood yet. She denies n/v. No abdominal pain. Denies BM.  Objective: Vital signs in last 24 hours: Temp:  [98 F (36.7 C)-98.2 F (36.8 C)] 98 F (36.7 C) (10/10 0420) Pulse Rate:  [63-69] 69  (10/10 0420) Resp:  [20] 20  (10/10 0420) BP: (116-155)/(69-75) 128/75 mmHg (10/10 0420) SpO2:  [95 %-97 %] 97 % (10/10 0420) Weight:  [225 lb 11.2 oz (102.377 kg)] 225 lb 11.2 oz (102.377 kg) (10/10 0420) Last BM Date: 09/11/12 General:   Alert,  Well-developed, well-nourished, pleasant and cooperative in NAD Head:  Normocephalic and atraumatic. Eyes:  Sclera clear, no icterus.   Abdomen:  Soft, nontender and nondistended. Normal bowel sounds, without guarding, and without rebound.   Extremities:  Without clubbing, deformity or edema. Neurologic:  Alert and  oriented x4;  grossly normal neurologically. Skin:  Intact without significant lesions or rashes. Psych:  Alert and cooperative. Normal mood and affect.  Intake/Output from previous day: 10/09 0701 - 10/10 0700 In: 720 [P.O.:720] Out: 150 [Urine:150] Intake/Output this shift:    Lab Results: CBC  Basename 09/12/12 0937  WBC 13.7*  HGB 12.1  HCT 36.7  MCV 84.0  PLT 243   BMET  Basename 09/12/12 0937  NA 139  K 2.5*  CL 103  CO2 23  GLUCOSE 134*  BUN 14  CREATININE 1.21*  CALCIUM 9.3        Assessment: 71 y/o female with diabetic gastroparesis, admitted with N/V. EGD with non-critical Schatzki's ring, diffuse gastric erosions and partially healing ulceration s/p biopsy which shows reactive gastropathy likely due to NSAIDs (no H.pylori identified). Patient developed confusion on low dose Reglan. Improved since stopping the medication. Probable D/C today if potassium improved.    Plan: 1. Continue PPI BID.  2. No Ketoprofen, avoid NSAIDs. 3. OV in 2 months for f/u.  LOS: 6 days   Tana Coast  09/13/2012,  11:33 AM

## 2012-09-13 NOTE — Progress Notes (Signed)
She says she feels much better and wants to go home. She has no new complaints. We are awaiting her potassium level. Exam shows that she is awake and alert. Her pupils are reactive. Her nose and throat are clear. Her abdomen is soft without masses. Extremities showed no edema. Central nervous system exam is grossly intact  I think she's ready for discharge but I like to see that her potassium has come up  We are attempting to get blood to check her potassium level. She will be discharged home with home health services

## 2012-09-13 NOTE — Telephone Encounter (Signed)
Pt is aware of OV on 11/13/12 @ 1130 with [facility] and appt card was mailed

## 2012-09-13 NOTE — Telephone Encounter (Signed)
Patient likely to be discharged from hospital today. Please schedule f/u OV in 2 months for gastric ulcer due to NSAIDs.

## 2012-09-13 NOTE — Progress Notes (Signed)
D/c instructions reviewed with patient.  Verbalized understanding.  Pt waiting for family to come to transport home.  Schonewitz, Candelaria Stagers 09/13/2012

## 2012-09-16 ENCOUNTER — Encounter (HOSPITAL_COMMUNITY): Payer: Self-pay | Admitting: Emergency Medicine

## 2012-09-16 ENCOUNTER — Emergency Department (HOSPITAL_COMMUNITY)
Admission: EM | Admit: 2012-09-16 | Discharge: 2012-09-16 | Disposition: A | Discharge status: Home / Self Care | Payer: PRIVATE HEALTH INSURANCE | Attending: Emergency Medicine | Admitting: Emergency Medicine

## 2012-09-16 ENCOUNTER — Emergency Department (HOSPITAL_COMMUNITY): Payer: PRIVATE HEALTH INSURANCE

## 2012-09-16 DIAGNOSIS — E119 Type 2 diabetes mellitus without complications: Secondary | ICD-10-CM | POA: Insufficient documentation

## 2012-09-16 DIAGNOSIS — K219 Gastro-esophageal reflux disease without esophagitis: Secondary | ICD-10-CM | POA: Insufficient documentation

## 2012-09-16 DIAGNOSIS — R197 Diarrhea, unspecified: Secondary | ICD-10-CM | POA: Insufficient documentation

## 2012-09-16 DIAGNOSIS — Z86718 Personal history of other venous thrombosis and embolism: Secondary | ICD-10-CM | POA: Insufficient documentation

## 2012-09-16 DIAGNOSIS — I129 Hypertensive chronic kidney disease with stage 1 through stage 4 chronic kidney disease, or unspecified chronic kidney disease: Secondary | ICD-10-CM | POA: Insufficient documentation

## 2012-09-16 DIAGNOSIS — R109 Unspecified abdominal pain: Secondary | ICD-10-CM | POA: Insufficient documentation

## 2012-09-16 DIAGNOSIS — Z950 Presence of cardiac pacemaker: Secondary | ICD-10-CM | POA: Insufficient documentation

## 2012-09-16 DIAGNOSIS — IMO0002 Reserved for concepts with insufficient information to code with codable children: Secondary | ICD-10-CM | POA: Insufficient documentation

## 2012-09-16 DIAGNOSIS — G40909 Epilepsy, unspecified, not intractable, without status epilepticus: Secondary | ICD-10-CM | POA: Insufficient documentation

## 2012-09-16 DIAGNOSIS — R112 Nausea with vomiting, unspecified: Secondary | ICD-10-CM | POA: Insufficient documentation

## 2012-09-16 DIAGNOSIS — Z951 Presence of aortocoronary bypass graft: Secondary | ICD-10-CM | POA: Insufficient documentation

## 2012-09-16 DIAGNOSIS — I252 Old myocardial infarction: Secondary | ICD-10-CM | POA: Insufficient documentation

## 2012-09-16 DIAGNOSIS — J4489 Other specified chronic obstructive pulmonary disease: Secondary | ICD-10-CM | POA: Insufficient documentation

## 2012-09-16 DIAGNOSIS — Z79899 Other long term (current) drug therapy: Secondary | ICD-10-CM | POA: Insufficient documentation

## 2012-09-16 DIAGNOSIS — J449 Chronic obstructive pulmonary disease, unspecified: Secondary | ICD-10-CM | POA: Insufficient documentation

## 2012-09-16 DIAGNOSIS — Z96659 Presence of unspecified artificial knee joint: Secondary | ICD-10-CM | POA: Insufficient documentation

## 2012-09-16 DIAGNOSIS — I2510 Atherosclerotic heart disease of native coronary artery without angina pectoris: Secondary | ICD-10-CM | POA: Insufficient documentation

## 2012-09-16 DIAGNOSIS — K529 Noninfective gastroenteritis and colitis, unspecified: Secondary | ICD-10-CM

## 2012-09-16 DIAGNOSIS — N189 Chronic kidney disease, unspecified: Secondary | ICD-10-CM | POA: Insufficient documentation

## 2012-09-16 DIAGNOSIS — Z7982 Long term (current) use of aspirin: Secondary | ICD-10-CM | POA: Insufficient documentation

## 2012-09-16 LAB — CBC WITH DIFFERENTIAL/PLATELET
Basophils Relative: 0 % (ref 0–1)
Eosinophils Absolute: 0.1 10*3/uL (ref 0.0–0.7)
Eosinophils Relative: 0 % (ref 0–5)
Hemoglobin: 10.8 g/dL — ABNORMAL LOW (ref 12.0–15.0)
MCH: 27.6 pg (ref 26.0–34.0)
MCHC: 32.8 g/dL (ref 30.0–36.0)
MCV: 83.9 fL (ref 78.0–100.0)
Monocytes Relative: 7 % (ref 3–12)
Neutrophils Relative %: 85 % — ABNORMAL HIGH (ref 43–77)
Platelets: 241 10*3/uL (ref 150–400)

## 2012-09-16 LAB — URINALYSIS, ROUTINE W REFLEX MICROSCOPIC
Bilirubin Urine: NEGATIVE
Leukocytes, UA: NEGATIVE
Nitrite: NEGATIVE
Specific Gravity, Urine: 1.025 (ref 1.005–1.030)
Urobilinogen, UA: 0.2 mg/dL (ref 0.0–1.0)
pH: 6 (ref 5.0–8.0)

## 2012-09-16 LAB — COMPREHENSIVE METABOLIC PANEL
Albumin: 3.7 g/dL (ref 3.5–5.2)
Alkaline Phosphatase: 88 U/L (ref 39–117)
BUN: 9 mg/dL (ref 6–23)
Calcium: 9.5 mg/dL (ref 8.4–10.5)
GFR calc Af Amer: 78 mL/min — ABNORMAL LOW (ref >90)
Potassium: 3.2 mEq/L — ABNORMAL LOW (ref 3.5–5.1)
Total Protein: 7.6 g/dL (ref 6.0–8.3)

## 2012-09-16 LAB — URINE MICROSCOPIC-ADD ON

## 2012-09-16 LAB — LIPASE, BLOOD: Lipase: 54 U/L (ref 11–59)

## 2012-09-16 MED ORDER — PROMETHAZINE HCL 25 MG PO TABS: 25.0000 mg | ORAL_TABLET | Freq: Four times a day (QID) | ORAL | Status: DC | PRN

## 2012-09-16 MED ORDER — ONDANSETRON HCL 4 MG/2ML IJ SOLN
4.0000 mg | Freq: Once | INTRAMUSCULAR | Status: AC
  Administered 2012-09-16: 4 mg via INTRAVENOUS
  Filled 2012-09-16: qty 2

## 2012-09-16 MED ORDER — PANTOPRAZOLE SODIUM 40 MG IV SOLR
40.0000 mg | Freq: Once | INTRAVENOUS | Status: AC
  Administered 2012-09-16: 40 mg via INTRAVENOUS
  Filled 2012-09-16: qty 40

## 2012-09-16 MED ORDER — SODIUM CHLORIDE 0.9 % IV BOLUS (SEPSIS)
1000.0000 mL | Freq: Once | INTRAVENOUS | Status: AC
  Administered 2012-09-16: 1000 mL via INTRAVENOUS

## 2012-09-16 MED ORDER — IOHEXOL 300 MG/ML  SOLN: 25.0000 mL | Freq: Once | INTRAMUSCULAR | Status: DC | PRN

## 2012-09-16 MED ORDER — IOHEXOL 300 MG/ML  SOLN
100.0000 mL | Freq: Once | INTRAMUSCULAR | Status: AC | PRN
  Administered 2012-09-16: 100 mL via INTRAVENOUS

## 2012-09-16 MED ORDER — HYDROMORPHONE HCL PF 1 MG/ML IJ SOLN
0.5000 mg | Freq: Once | INTRAMUSCULAR | Status: AC
  Administered 2012-09-16: 0.5 mg via INTRAVENOUS
  Filled 2012-09-16: qty 1

## 2012-09-16 MED ORDER — SODIUM CHLORIDE 0.9 % IV SOLN
INTRAVENOUS | Status: DC
  Administered 2012-09-16: 125 mL/h via INTRAVENOUS

## 2012-09-16 MED ORDER — DIPHENOXYLATE-ATROPINE 2.5-0.025 MG PO TABS: 1.0000 | ORAL_TABLET | Freq: Four times a day (QID) | ORAL | Status: DC | PRN

## 2012-09-16 NOTE — ED Notes (Signed)
States she cannot drink any more of the contrast

## 2012-09-16 NOTE — Discharge Summary (Signed)
Physician Discharge Summary  Patient ID: [name] MRN: [id number] DOB/AGE: 12-29-1940 71 y.o. Primary Care Physician:Ornella Coderre L, MD Admit date: 09/07/2012 Discharge date: 09/16/2012    Discharge Diagnoses:   Principal Problem:  *Nausea & vomiting Active Problems:  HYPERTENSION, BENIGN  CAD (coronary artery disease)  Gastroparesis  SEIZURE DISORDER  Chronic diastolic heart failure  COPD (chronic obstructive pulmonary disease)  Dehydration  Hypokalemia  gastritis    Medication List     As of 09/16/2012  8:40 AM    STOP taking these medications         ketoprofen 50 MG capsule   Commonly known as: ORUDIS      metoCLOPramide 10 MG tablet   Commonly known as: REGLAN      TAKE these medications         amLODipine 10 MG tablet   Commonly known as: NORVASC   Take 10 mg by mouth daily.      aspirin 81 MG tablet   Take 1 tablet (81 mg total) by mouth daily.      atorvastatin 40 MG tablet   Commonly known as: LIPITOR   Take 1 tablet (40 mg total) by mouth daily at 6 PM.      benazepril 20 MG tablet   Commonly known as: LOTENSIN   Take 20 mg by mouth 2 (two) times daily.      BROMDAY 0.09 % Soln   Generic drug: Bromfenac Sodium   Place 1 drop into the right eye 3 (three) times daily.      doxepin 25 MG capsule   Commonly known as: SINEQUAN   Take 25 mg by mouth at bedtime.      estradiol 0.5 MG tablet   Commonly known as: ESTRACE   Take 0.5 mg by mouth daily.      fish oil-omega-3 fatty acids 1000 MG capsule   Take 1 g by mouth 2 (two) times daily.      furosemide 40 MG tablet   Commonly known as: LASIX   Take 2 tablets (80 mg total) by mouth 2 (two) times daily.      gabapentin 300 MG capsule   Commonly known as: NEURONTIN   Take 300 mg by mouth 3 (three) times daily.      hydrALAZINE 25 MG tablet   Commonly known as: APRESOLINE   Take 1 tablet (25 mg total) by mouth every 8 (eight) hours.      metFORMIN 500 MG tablet   Commonly known  as: GLUCOPHAGE   Take 500 mg by mouth 2 (two) times daily with a meal.      metoprolol tartrate 25 MG tablet   Commonly known as: LOPRESSOR   Take 0.5 tablets (12.5 mg total) by mouth 2 (two) times daily.      nitroGLYCERIN 0.4 MG SL tablet   Commonly known as: NITROSTAT   Place 1 tablet (0.4 mg total) under the tongue every 5 (five) minutes as needed for chest pain (Up to 3 doses).      ondansetron 8 MG disintegrating tablet   Commonly known as: ZOFRAN-ODT   Take 8 mg by mouth every 8 (eight) hours as needed. Nausea and Vomiting      phenytoin 100 MG ER capsule   Commonly known as: DILANTIN   Take 300 mg by mouth at bedtime.      potassium chloride 10 MEQ CR tablet   Commonly known as: KLOR-CON   Take 10 mEq by mouth daily.  senna-docusate 8.6-50 MG per tablet   Commonly known as: Senokot-S   Take 1 tablet by mouth daily as needed.        Discharged Condition:improved    Consults:GI  Significant Diagnostic Studies: Dg Chest Port 1 View  08/24/2012  *RADIOLOGY REPORT*  Clinical Data: Weakness, fever, vomiting, history diabetes, hypertension, coronary disease, COPD  PORTABLE CHEST - 1 VIEW  Comparison: Portable exam 1415 hours compared to 03/23/2012  Findings: Right subclavian sequential transveous pacemaker leads project at right atrium and right ventricle. Enlargement of cardiac silhouette post CABG. Tortuous aorta. Mediastinal contours and pulmonary vascularity otherwise normal. Lungs clear. No pleural effusion or pneumothorax.  IMPRESSION: Enlargement of cardiac silhouette post CABG and pacemaker. No acute abnormalities.   Original Report Authenticated By: Lollie Marrow, M.D.    Dg Abd Acute W/chest  09/07/2012  *RADIOLOGY REPORT*  Clinical Data: Vomiting, diarrhea and pain.  ACUTE ABDOMEN SERIES (ABDOMEN 2 VIEW & CHEST 1 VIEW)  Comparison: Chest radiograph 08/24/2012  Findings: Single view chest demonstrates a right dual lead cardiac pacemaker.  Heart size appears to  be enlarged and may be accentuated by the technique.  Slightly prominent central vascular markings, particularly at the right lung base.  There is a nonspecific abdominal bowel gas pattern.  Previous lumbar spine surgery and right hip replacement.  Multiple calcifications in the pelvis. Limited upright view but no clear evidence for free air.  IMPRESSION: Nonspecific bowel gas pattern.  Cardiomegaly with mild peribronchial thickening in the right lung and cannot exclude basilar densities.  Findings could be related to dependent edema or atelectasis.   Original Report Authenticated By: Richarda Overlie, M.D.     Lab Results: Basic Metabolic Panel:  Basename 09/13/12 1300  NA 139  K 4.1  CL 107  CO2 17*  GLUCOSE 121*  BUN 16  CREATININE 1.05  CALCIUM 9.2  MG --  PHOS --   Liver Function Tests: No results found for this basename: AST:2,ALT:2,ALKPHOS:2,BILITOT:2,PROT:2,ALBUMIN:2 in the last 72 hours   CBC: No results found for this basename: WBC:2,NEUTROABS:2,HGB:2,HCT:2,MCV:2,PLT:2 in the last 72 hours  Recent Results (from the past 240 hour(s))  URINE CULTURE     Status: Normal   Collection Time   09/07/12  1:12 PM      Component Value Range Status Comment   Specimen Description URINE, CLEAN CATCH   Final    Special Requests NONE   Final    Culture  Setup Time 09/08/2012 20:47   Final    Colony Count NO GROWTH   Final    Culture NO GROWTH   Final    Report Status 09/09/2012 FINAL   Final   CLOSTRIDIUM DIFFICILE BY PCR     Status: Normal   Collection Time   09/11/12  2:20 PM      Component Value Range Status Comment   C difficile by pcr NEGATIVE  NEGATIVE Final      Hospital Course: She was admitted with nausea and vomiting she says it been going on for about a month. She had been off Reglan and started back on anti-inflammatory medications. She was given IV fluids and her hemoglobin level was checked serially and she did not require blood transfusion. She had consultation with GI  department and was found to have gastritis on EGD. She was started back on Reglan and had some confusion so that was discontinued again. Her potassium was low and it was replaced and she was slowly repleted at the time of discharge. She  was able to eat and drink and keep her food down  Discharge Exam: Blood pressure 197/82, pulse 70, temperature 98.5 F (36.9 C), temperature source Oral, resp. rate 20, height 5\' 2"  (1.575 m), weight 102.377 kg (225 lb 11.2 oz), SpO2 95.00%. She was awake and alert. Her chest was clear. Her heart was irregular. Her abdomen was soft without masses  Disposition: Home with home health services      Discharge Orders    Future Appointments: Provider: Department: Dept Phone: Center:   10/22/2012 9:45 AM [facility] [phone] [facility]   10/22/2012 10:15 AM Cassell Clement, MD [facility] [phone] [facility]   11/13/2012 11:30 AM Nira Retort, NP [facility] [phone] [facility]     Future Orders Please Complete By Expires   Home Health      Questions: Responses:   To provide the following care/treatments PT    RN   Face-to-face encounter      Comments:   I Orlander Norwood L certify that this patient is under my care and that I, or a nurse practitioner or physician's assistant working with me, had a face-to-face encounter that meets the physician face-to-face encounter requirements with this patient on 09/13/2012.   Questions: Responses:   The encounter with the patient was in whole, or in part, for the following medical condition, which is the primary reason for home health care nausea vomiting dehydration   I certify that, based on my findings, the following services are medically necessary home health services Nursing    Physical therapy   My clinical findings support the need for the above services Patients with multiple co-morbidities (wounds and uncontrolled DM for example)   Further, I  certify that my clinical findings support that this patient is homebound due to: Ambulates short distances less than 300 feet   To provide the following care/treatments PT    RN      Follow-up Information    Follow up with [facility].   Contact information:   [address] [place] [address] [phone]         Signed: Fredirick Maudlin Pager [phone]  09/16/2012, 8:40 AM

## 2012-09-16 NOTE — ED Notes (Signed)
Pt ambulated to restroom. 

## 2012-09-16 NOTE — ED Notes (Signed)
Pt c/o abd pain/n/v/d today. 

## 2012-09-16 NOTE — ED Notes (Signed)
Daughter contacted to come pick up patient for discharge.  Is coming

## 2012-09-16 NOTE — ED Notes (Signed)
Pt gave a urine sample in case needed, pt urine is in pt's room labeled.

## 2012-09-16 NOTE — ED Notes (Signed)
Completed one full bottle of contrast fluid po and started on second bottle.  Stopped after about one ounce - states she is too full. Encouraged to wait a few minutes and then try some more.

## 2012-09-16 NOTE — ED Provider Notes (Signed)
History  This chart was scribed for Donnetta Hutching, MD by Erskine Emery. This patient was seen in room APA08/APA08 and the patient's care was started at 15:06.   CSN: [id number]  Arrival date & time 09/16/12  1452   First MD Initiated Contact with Patient 09/16/12 1506      Chief Complaint  Patient presents with  . Emesis    (Consider location/radiation/quality/duration/timing/severity/associated sxs/prior treatment) The history is provided by the patient. No language interpreter was used.  [name] is a 71 y.o. female who presents to the Emergency Department complaining of abdominal pain, nausea, emesis (about 10 episodes), and diarrhea (several, liquid-like episodes) since this morning. Pt reports she did urinate about 3 times today and her urine was dark yellow. Pt denies eating anything different. Pt lives with her husband and several other family members but denies any of them have been sick.  Nothing makes symptoms better or worse. Severity is mild to moderate  Dr. Juanetta Gosling is the pt's PCP.  Past Medical History  Diagnosis Date  . Diabetes mellitus   . HTN (hypertension)   . Seizure disorder   . CAD (coronary artery disease)     multivessel, CABG 6/10.  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  Myoview (11/20/11) showed no ischemia & EF 62%  . GERD (gastroesophageal reflux disease)   . Hyperlipidemia   . DVT (deep venous thrombosis)     R arm venous occlusion s/p prior PPM  . Obesity   . DDD (degenerative disc disease)   . Complete heart block 1993    s/p PPM, most recent generator change 2003  . Dehiscence of closure of sternum or sternotomy 8/10    required flap  . COPD (chronic obstructive pulmonary disease)     severe lung disease by PFTs 6/12  . Asthma   . Sleep apnea   . Wound infection     Sternal  . H/O: hysterectomy   . Chronic kidney disease   . Seizures     Past Surgical History  Procedure Date  . Pacemaker placement 1993    Status post DDD pacemaker implantation in  1993, with Medtronic Kappa generator change in 2003 for  second-degree AV block, most recent gen change by Fawn Kirk 04/14/11  . Revision total hip arthroplasty   . Replacement total knee   . Thyroid surgery   . Wrist surgery   . Coronary artery bypass graft     - LIMA to LAD, SVG to OM, SVG to PDA  . Reconstructive repair sternal     for infection s/p CABG 8/10  . Back surgery   . Esophagogastroduodenoscopy     with [facility] dilation,  biopsy, and disruption Schatzki's ring  . Colonoscopy 04/27/2005    Family History  Problem Relation Age of Onset  . Diabetes    . Heart disease Mother     MI  . Coronary artery disease      Female <55  . Cancer      History  Substance Use Topics  . Smoking status: Former Games developer  . Smokeless tobacco: Never Used  . Alcohol Use: No    OB History    Grav Para Term Preterm Abortions TAB SAB Ect Mult Living                  Review of Systems  Constitutional: Negative for fever and chills.  HENT: Negative for tinnitus.   Respiratory: Negative for cough.   Gastrointestinal: Positive for nausea, vomiting, abdominal pain and  diarrhea.  Genitourinary: Negative for hematuria.  Skin: Negative for rash.  All other systems reviewed and are negative.   Allergies  Codeine; Other; and Oxycodone hcl  Home Medications   Current Outpatient Rx  Name Route Sig Dispense Refill  . AMLODIPINE BESYLATE 10 MG PO TABS Oral Take 10 mg by mouth daily.    . ASPIRIN 81 MG PO TABS Oral Take 1 tablet (81 mg total) by mouth daily.    . ATORVASTATIN CALCIUM 40 MG PO TABS Oral Take 1 tablet (40 mg total) by mouth daily at 6 PM. 30 tablet 6  . BENAZEPRIL HCL 20 MG PO TABS Oral Take 20 mg by mouth 2 (two) times daily.    Marland Kitchen BROMFENAC SODIUM (ONCE-DAILY) 0.09 % OP SOLN Right Eye Place 1 drop into the right eye 3 (three) times daily.    Marland Kitchen DOXEPIN HCL 25 MG PO CAPS Oral Take 25 mg by mouth at bedtime.    Marland Kitchen ESTRADIOL 0.5 MG PO TABS Oral Take 0.5 mg by mouth daily.      .  OMEGA-3 FATTY ACIDS 1000 MG PO CAPS Oral Take 1 g by mouth 2 (two) times daily.     . FUROSEMIDE 40 MG PO TABS Oral Take 2 tablets (80 mg total) by mouth 2 (two) times daily. 120 tablet 3  . GABAPENTIN 300 MG PO CAPS Oral Take 300 mg by mouth 3 (three) times daily.    Marland Kitchen HYDRALAZINE HCL 25 MG PO TABS Oral Take 1 tablet (25 mg total) by mouth every 8 (eight) hours. 90 tablet 6  . METFORMIN HCL 500 MG PO TABS Oral Take 500 mg by mouth 2 (two) times daily with a meal.     . METOPROLOL TARTRATE 25 MG PO TABS Oral Take 0.5 tablets (12.5 mg total) by mouth 2 (two) times daily. 30 tablet 6  . NITROGLYCERIN 0.4 MG SL SUBL Sublingual Place 1 tablet (0.4 mg total) under the tongue every 5 (five) minutes as needed for chest pain (Up to 3 doses). 25 tablet 3  . ONDANSETRON 8 MG PO TBDP Oral Take 8 mg by mouth every 8 (eight) hours as needed. Nausea and Vomiting    . PHENYTOIN SODIUM EXTENDED 100 MG PO CAPS Oral Take 300 mg by mouth at bedtime.      Marland Kitchen POTASSIUM CHLORIDE 10 MEQ PO TBCR Oral Take 10 mEq by mouth daily.    Bernadette Hoit SODIUM 8.6-50 MG PO TABS Oral Take 1 tablet by mouth daily as needed.      Triage Vitals: Pulse 77  Temp 98.8 F (37.1 C) (Oral)  Resp 18  Ht 5\' 2"  (1.575 m)  Wt 205 lb (92.987 kg)  BMI 37.49 kg/m2  SpO2 97%  Physical Exam  Nursing note and vitals reviewed. Constitutional: She is oriented to person, place, and time. She appears well-developed and well-nourished.  HENT:  Head: Normocephalic and atraumatic.  Eyes: Conjunctivae normal and EOM are normal. Pupils are equal, round, and reactive to light.  Neck: Normal range of motion. Neck supple.  Cardiovascular: Normal rate, regular rhythm and normal heart sounds.   Pulmonary/Chest: Effort normal and breath sounds normal.  Abdominal: Soft. Bowel sounds are normal. There is no tenderness.  Musculoskeletal: Normal range of motion.  Neurological: She is alert and oriented to person, place, and time.  Skin: Skin is  warm and dry.  Psychiatric: She has a normal mood and affect.    ED Course  Procedures (including critical care time) DIAGNOSTIC  STUDIES: Oxygen Saturation is 97% on room air, adequate by my interpretation.    COORDINATION OF CARE: 15:29--I evaluated the patient and we discussed a treatment plan including labs and IV fluids, Zofran, and protonics to which the pt agreed.  18:55--I rechecked the pt. She consented to doing an abdominal CT, with contrast, for further evaluation.  Results for orders placed during the hospital encounter of 09/16/12  CBC WITH DIFFERENTIAL      Component Value Range   WBC 13.5 (*) 4.0 - 10.5 K/uL   RBC 3.92  3.87 - 5.11 MIL/uL   Hemoglobin 10.8 (*) 12.0 - 15.0 g/dL   HCT 45.4 (*) 09.8 - 11.9 %   MCV 83.9  78.0 - 100.0 fL   MCH 27.6  26.0 - 34.0 pg   MCHC 32.8  30.0 - 36.0 g/dL   RDW 14.7  82.9 - 56.2 %   Platelets 241  150 - 400 K/uL   Neutrophils Relative 85 (*) 43 - 77 %   Neutro Abs 11.5 (*) 1.7 - 7.7 K/uL   Lymphocytes Relative 8 (*) 12 - 46 %   Lymphs Abs 1.0  0.7 - 4.0 K/uL   Monocytes Relative 7  3 - 12 %   Monocytes Absolute 0.9  0.1 - 1.0 K/uL   Eosinophils Relative 0  0 - 5 %   Eosinophils Absolute 0.1  0.0 - 0.7 K/uL   Basophils Relative 0  0 - 1 %   Basophils Absolute 0.0  0.0 - 0.1 K/uL  COMPREHENSIVE METABOLIC PANEL      Component Value Range   Sodium 134 (*) 135 - 145 mEq/L   Potassium 3.2 (*) 3.5 - 5.1 mEq/L   Chloride 101  96 - 112 mEq/L   CO2 20  19 - 32 mEq/L   Glucose, Bld 128 (*) 70 - 99 mg/dL   BUN 9  6 - 23 mg/dL   Creatinine, Ser 1.30  0.50 - 1.10 mg/dL   Calcium 9.5  8.4 - 86.5 mg/dL   Total Protein 7.6  6.0 - 8.3 g/dL   Albumin 3.7  3.5 - 5.2 g/dL   AST 19  0 - 37 U/L   ALT 25  0 - 35 U/L   Alkaline Phosphatase 88  39 - 117 U/L   Total Bilirubin 0.3  0.3 - 1.2 mg/dL   GFR calc non Af Amer 67 (*) >90 mL/min   GFR calc Af Amer 78 (*) >90 mL/min  LIPASE, BLOOD      Component Value Range   Lipase 54  11 - 59 U/L       No diagnosis found.    MDM  CT abdomen pelvis obtained.  Results of scan suggests enteritis.  Patient stable after IV fluids.  Can discharge home      I personally performed the services described in this documentation, which was scribed in my presence. The recorded information has been reviewed and considered.    Donnetta Hutching, MD 10/02/12 2147

## 2012-09-16 NOTE — ED Notes (Signed)
Pt discharged. Pt stable at time of discharge. Medications reviewed pt has no questions regarding discharge at this time. Pt voiced understanding of discharge instructions.  

## 2012-09-17 ENCOUNTER — Encounter (HOSPITAL_COMMUNITY): Payer: Self-pay | Admitting: Internal Medicine

## 2012-09-19 ENCOUNTER — Encounter: Payer: Self-pay | Admitting: *Deleted

## 2012-10-12 ENCOUNTER — Other Ambulatory Visit: Payer: Self-pay | Admitting: Neurology

## 2012-10-12 DIAGNOSIS — M48061 Spinal stenosis, lumbar region without neurogenic claudication: Secondary | ICD-10-CM

## 2012-10-12 DIAGNOSIS — M545 Low back pain: Secondary | ICD-10-CM

## 2012-10-15 ENCOUNTER — Ambulatory Visit: Payer: PRIVATE HEALTH INSURANCE | Admitting: Adult Health

## 2012-10-16 ENCOUNTER — Ambulatory Visit: Payer: PRIVATE HEALTH INSURANCE | Admitting: Adult Health

## 2012-10-16 ENCOUNTER — Other Ambulatory Visit: Payer: PRIVATE HEALTH INSURANCE

## 2012-10-22 ENCOUNTER — Encounter: Payer: PRIVATE HEALTH INSURANCE | Admitting: *Deleted

## 2012-10-22 ENCOUNTER — Ambulatory Visit: Payer: PRIVATE HEALTH INSURANCE | Admitting: Cardiology

## 2012-10-22 ENCOUNTER — Ambulatory Visit: Payer: PRIVATE HEALTH INSURANCE | Admitting: Adult Health

## 2012-10-24 ENCOUNTER — Encounter: Payer: Self-pay | Admitting: Physician Assistant

## 2012-10-24 ENCOUNTER — Ambulatory Visit (INDEPENDENT_AMBULATORY_CARE_PROVIDER_SITE_OTHER): Payer: PRIVATE HEALTH INSURANCE | Admitting: Physician Assistant

## 2012-10-24 ENCOUNTER — Ambulatory Visit: Payer: PRIVATE HEALTH INSURANCE | Admitting: Adult Health

## 2012-10-24 VITALS — BP 170/110 | HR 80 | Ht 62.0 in | Wt 216.0 lb

## 2012-10-24 DIAGNOSIS — I10 Essential (primary) hypertension: Secondary | ICD-10-CM

## 2012-10-24 DIAGNOSIS — I509 Heart failure, unspecified: Secondary | ICD-10-CM

## 2012-10-24 DIAGNOSIS — I2510 Atherosclerotic heart disease of native coronary artery without angina pectoris: Secondary | ICD-10-CM

## 2012-10-24 DIAGNOSIS — Z950 Presence of cardiac pacemaker: Secondary | ICD-10-CM

## 2012-10-24 DIAGNOSIS — I5033 Acute on chronic diastolic (congestive) heart failure: Secondary | ICD-10-CM

## 2012-10-24 MED ORDER — METOPROLOL TARTRATE 25 MG PO TABS: 25.0000 mg | ORAL_TABLET | Freq: Two times a day (BID) | ORAL | Status: DC

## 2012-10-24 NOTE — Patient Instructions (Signed)
Your physician recommends that you schedule a follow-up appointment in: 1 month  Your physician has recommended you make the following change in your medication:  1 - TAKE extra 40 mg of Lasix for the next 3 days 2 - TAKE extra dose of potassium for next 3 days 3 - INCREASE Metoprolol to 25 mg twice a day  2 gram Sodium Diet  We will refer you to [facility] network for assistance with any medical needs you may have at home

## 2012-10-24 NOTE — Progress Notes (Signed)
HPI:  This is a 71 year old African American female patient of Dr. Kari Baars and Hillis Range. She has a history of coronary artery disease status post CABG 6/10, second-degree AV block status post pacemaker, hypertension, hyperlipidemia, diastolic heart failure, seizure disorder, COPD, GERD, and history of right arm DVT. Myoview in 2/11 showed ejection fraction 62% and fixed septal defect consistent with breast attenuation, minimal inferolateral reversible defect, attenuation versus mild ischemia. Last echo in 2/11 showed mild to moderate LVH, ejection fraction 65-70%, MAC. Last heart catheter in 1/12 ejection fraction was 60%, mid LAD 80%, diagonal one and diagonal 2 small with severe disease, circumflex and RCA occluded, SVG to the PDA okay, SVG to the OM okay, LIMA to the LAD okay, grafts patent and medical therapy continued. She underwent pacer generator change in 2012. She has chronic shortness of breath.  The patient comes in today for followup after recent hospitalization for gastritis. She complains of chronic shortness of breath, dyspnea on exertion and edema. She says her blood pressure always runs high at about 167/80. She is to take public transportation to get here and they came early today so she missed her medications. She says she usually takes her medicines regularly and rarely misses. She has difficulty getting to [facility] to see Dr. Johney Frame and would like to establish a cardiologist here because she has no transportation.     Allergies: -- Codeine    --  "good" headache  -- Other    --  Leafy raw vegetables and seeds  -- Oxycodone Hcl    --  "good" headache  Current Outpatient Prescriptions on File Prior to Visit: amLODipine (NORVASC) 10 MG tablet, Take 10 mg by mouth daily., Disp: , Rfl:   aspirin EC 81 MG tablet, Take 81 mg by mouth daily., Disp: , Rfl:  atorvastatin (LIPITOR) 40 MG tablet, Take 1 tablet (40 mg total) by mouth daily at 6 PM., Disp: 30 tablet, Rfl:  6 benazepril (LOTENSIN) 20 MG tablet, Take 20 mg by mouth 2 (two) times daily., Disp: , Rfl:  Bromfenac Sodium (BROMDAY) 0.09 % SOLN, Place 1 drop into the right eye 3 (three) times daily., Disp: , Rfl:  diphenoxylate-atropine (LOMOTIL) 2.5-0.025 MG per tablet, Take 1 tablet by mouth 4 (four) times daily as needed for diarrhea or loose stools., Disp: 20 tablet, Rfl: 0 doxepin (SINEQUAN) 25 MG capsule, Take 25 mg by mouth at bedtime., Disp: , Rfl:  estradiol (ESTRACE) 0.5 MG tablet, Take 0.5 mg by mouth daily.  , Disp: , Rfl:  fish oil-omega-3 fatty acids 1000 MG capsule, Take 1 g by mouth 2 (two) times daily. , Disp: , Rfl:  furosemide (LASIX) 40 MG tablet, Take 2 tablets (80 mg total) by mouth 2 (two) times daily., Disp: 120 tablet, Rfl: 3 gabapentin (NEURONTIN) 300 MG capsule, Take 300 mg by mouth 3 (three) times daily., Disp: , Rfl:  hydrALAZINE (APRESOLINE) 25 MG tablet, Take 1 tablet (25 mg total) by mouth every 8 (eight) hours., Disp: 90 tablet, Rfl: 6 metFORMIN (GLUCOPHAGE) 500 MG tablet, Take 500 mg by mouth 2 (two) times daily with a meal. , Disp: , Rfl:  metoprolol tartrate (LOPRESSOR) 25 MG tablet, Take 0.5 tablets (12.5 mg total) by mouth 2 (two) times daily., Disp: 30 tablet, Rfl: 6 nitroGLYCERIN (NITROSTAT) 0.4 MG SL tablet, Place 1 tablet (0.4 mg total) under the tongue every 5 (five) minutes as needed for chest pain (Up to 3 doses)., Disp: 25 tablet, Rfl: 3 ondansetron (ZOFRAN-ODT) 8 MG disintegrating  tablet, Take 8 mg by mouth every 8 (eight) hours as needed. Nausea and Vomiting, Disp: , Rfl:  phenytoin (DILANTIN) 100 MG ER capsule, Take 300 mg by mouth at bedtime.  , Disp: , Rfl:  potassium chloride (KLOR-CON) 10 MEQ CR tablet, Take 10 mEq by mouth daily., Disp: , Rfl:  promethazine (PHENERGAN) 25 MG tablet, Take 1 tablet (25 mg total) by mouth every 6 (six) hours as needed for nausea., Disp: 15 tablet, Rfl: 0 senna-docusate (SENOKOT-S) 8.6-50 MG per tablet, Take 1 tablet by mouth  daily as needed., Disp: , Rfl:     Past Medical History:   Diabetes mellitus                                            HTN (hypertension)                                           Seizure disorder                                             CAD (coronary artery disease)                                  Comment:multivessel, CABG 6/10.  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                Myoview (11/20/11) showed no ischemia & EF 62%   GERD (gastroesophageal reflux disease)                       Hyperlipidemia                                               DVT (deep venous thrombosis)                                   Comment:R arm venous occlusion s/p prior PPM   Obesity                                                      DDD (degenerative disc disease)                              Complete heart block                            1993           Comment:s/p PPM, most recent generator change 2003   Dehiscence of closure of sternum or sternotomy  8/10           Comment:required flap   COPD (chronic obstructive pulmonary disease)  Comment:severe lung disease by PFTs 6/12   Asthma                                                       Sleep apnea                                                  Wound infection                                                Comment:Sternal   H/O: hysterectomy                                            Chronic kidney disease                                       Seizures                                                    Past Surgical History:   PACEMAKER PLACEMENT                             1993           Comment:Status post DDD pacemaker implantation in 1993,              with Medtronic Kappa generator change in 2003               for  second-degree AV block, most recent gen               change by Fawn Kirk 04/14/11   REVISION TOTAL HIP ARTHROPLASTY                              REPLACEMENT TOTAL KNEE                                       THYROID SURGERY                                               WRIST SURGERY                                                CORONARY ARTERY BYPASS GRAFT  Comment:- LIMA to LAD, SVG to OM, SVG to PDA   RECONSTRUCTIVE REPAIR STERNAL                                  Comment:for infection s/p CABG 8/10   BACK SURGERY                                                 ESOPHAGOGASTRODUODENOSCOPY                                     Comment:with [facility] dilation,  biopsy, and disruption               Schatzki's ring   COLONOSCOPY                                     04/27/2005    ESOPHAGOGASTRODUODENOSCOPY                      09/10/2012      Comment:Procedure: ESOPHAGOGASTRODUODENOSCOPY (EGD);                Surgeon: Corbin Ade, MD;  Location: [facility]              SUITE;  Service: Endoscopy;  Laterality: Left;  Review of patient's family history indicates:   Diabetes                                                Heart disease                  Mother                     Comment: MI   Coronary artery disease                                   Comment: Female <55   Cancer                                                  Social History   Marital Status: Married             Spouse Name:                      Years of Education:                 Number of children:             Occupational History   None on file  Social History Main Topics   Smoking Status: Former Smoker                   Packs/Day:       Years:  Smokeless Status: Never Used                       Alcohol Use: No             Drug Use: No             Sexual Activity: Not Currently          Birth Control/Protection: Post-menopausal, Surgical  Other Topics            Concern   None on file  Social History Narrative   None on file    ROS:She continues to have trouble with vomiting about twice a week. She has chronic arthritis, cataracts, wears glasses. Please see history of present illness   PHYSICAL EXAM:  Obese, in no acute distress. Neck: No JVD, HJR, Bruit, or thyroid enlargement  Lungs:Decreased rest sounds but clear, No tachypnea, clear without wheezing, rales, or rhonchi  Cardiovascular: RRR, PMI not displaced, heart sounds normal, no murmurs, gallops, bruit, thrill, or heave.  Abdomen: BS normal. Soft without organomegaly, masses, lesions or tenderness.  Extremities: +1 edema in the ankles bilaterally,without cyanosis, clubbing. Good distal pulses bilateral  SKin: Warm, no lesions or rashes   Musculoskeletal: No deformities  Neuro: no focal signs  BP 170/110  Pulse 80  Ht 5\' 2"  (1.575 m)  Wt 216 lb (97.977 kg)  BMI 39.51 kg/m2

## 2012-10-24 NOTE — Assessment & Plan Note (Signed)
Patient's blood pressure is extremely high today because she missed her medications prior to the office visit. I will increase her metoprolol to 25 mg b.i.d. She has been instructed on 2 g sodium diet.

## 2012-10-24 NOTE — Assessment & Plan Note (Signed)
Patient has acute on chronic diastolic heart failure. Her blood pressure is extremely elevated today because she did not take her medications. Patient is on Lasix 80 mg b.i.d. I will add extra 40 mg daily for 3 days. We will increase her potassium to 10 mEq b.i.d. For 3 days. Have discussed a 2 g sodium diet with the patient. We will have her see Dr. Dietrich Pates in follow-up because she has no transportation to [place].I have also referred her to the [facility].

## 2012-10-24 NOTE — Assessment & Plan Note (Signed)
Stable

## 2012-10-24 NOTE — Assessment & Plan Note (Addendum)
Follow up with Dr. Johney Frame for pacer

## 2012-10-30 ENCOUNTER — Encounter: Payer: Self-pay | Admitting: *Deleted

## 2012-11-05 ENCOUNTER — Other Ambulatory Visit: Payer: Self-pay | Admitting: Physician Assistant

## 2012-11-05 LAB — HEPATIC FUNCTION PANEL
Alkaline Phosphatase: 71 U/L (ref 39–117)
Indirect Bilirubin: 0.4 mg/dL (ref 0.0–0.9)
Total Bilirubin: 0.6 mg/dL (ref 0.3–1.2)
Total Protein: 7.6 g/dL (ref 6.0–8.3)

## 2012-11-05 LAB — LIPID PANEL
LDL Cholesterol: 174 mg/dL — ABNORMAL HIGH (ref 0–99)
VLDL: 27 mg/dL (ref 0–40)

## 2012-11-07 ENCOUNTER — Telehealth: Payer: Self-pay | Admitting: *Deleted

## 2012-11-07 DIAGNOSIS — E785 Hyperlipidemia, unspecified: Secondary | ICD-10-CM

## 2012-11-07 NOTE — Telephone Encounter (Signed)
pt states she has not been taking lipitor everyday like she should, said she did not know why. I asked for pt to please take everyday and we will repeat FLP/LFT 3 months (02-04-13), pt verbalized understanding to me today

## 2012-11-07 NOTE — Telephone Encounter (Signed)
Message copied by Tarri Fuller on Wed Nov 07, 2012  3:53 PM ------      Message from: [place], [place] T      Created: Tue Nov 06, 2012  8:24 AM       Is she taking Lipitor?      LDL way too high.      Tereso Newcomer, PA-C  8:24 AM 11/06/2012

## 2012-11-08 LAB — BASIC METABOLIC PANEL
BUN: 8 mg/dL (ref 6–23)
CO2: 23 mEq/L (ref 19–32)
Chloride: 100 mEq/L (ref 96–112)
Creat: 1 mg/dL (ref 0.50–1.10)
Potassium: 3.4 mEq/L — ABNORMAL LOW (ref 3.5–5.3)

## 2012-11-09 ENCOUNTER — Telehealth: Payer: Self-pay | Admitting: *Deleted

## 2012-11-09 DIAGNOSIS — I5033 Acute on chronic diastolic (congestive) heart failure: Secondary | ICD-10-CM

## 2012-11-09 MED ORDER — POTASSIUM CHLORIDE ER 10 MEQ PO TBCR: 20.0000 meq | EXTENDED_RELEASE_TABLET | Freq: Every day | ORAL | Status: DC

## 2012-11-09 NOTE — Telephone Encounter (Signed)
s/w pt and then her CNA who comes in and helps her. both aware to increase K+ to 20 qd, bmet 11/19/12. CNA states pt has not taken meds in 2 dys due to vomiting. said called PCP, but did not lmom. advised cb pcp, lm for RN cb, pt needs take meds today. CNA verbalized understanding today

## 2012-11-09 NOTE — Telephone Encounter (Signed)
Message copied by Tarri Fuller on Fri Nov 09, 2012 12:21 PM ------      Message from: [place], [place] T      Created: Thu Nov 08, 2012  5:20 PM       Chart indicates she takes K+ 10 mEq QD.      Leda Gauze, PA-C note indicates she takes K+ 10 mEq bid.      Double current dose of K+ (i.e. increase 10 QD to 20 QD or increase 10 bid to 20 bid).  Make sure med list in chart correct.      Check BMET in one week.      Tereso Newcomer, PA-C  5:19 PM 11/08/2012

## 2012-11-13 ENCOUNTER — Ambulatory Visit: Payer: PRIVATE HEALTH INSURANCE | Admitting: Gastroenterology

## 2012-11-19 ENCOUNTER — Other Ambulatory Visit: Payer: PRIVATE HEALTH INSURANCE

## 2012-11-19 IMAGING — CR DG CHEST 1V PORT
1 series · 1 of 1 positions shown · non-contrast
Comparison: Portable exam 6555 hrs compared to 01/18/2011

CLINICAL DATA: Chest pain

PORTABLE CHEST - 1 VIEW

[view not recorded]
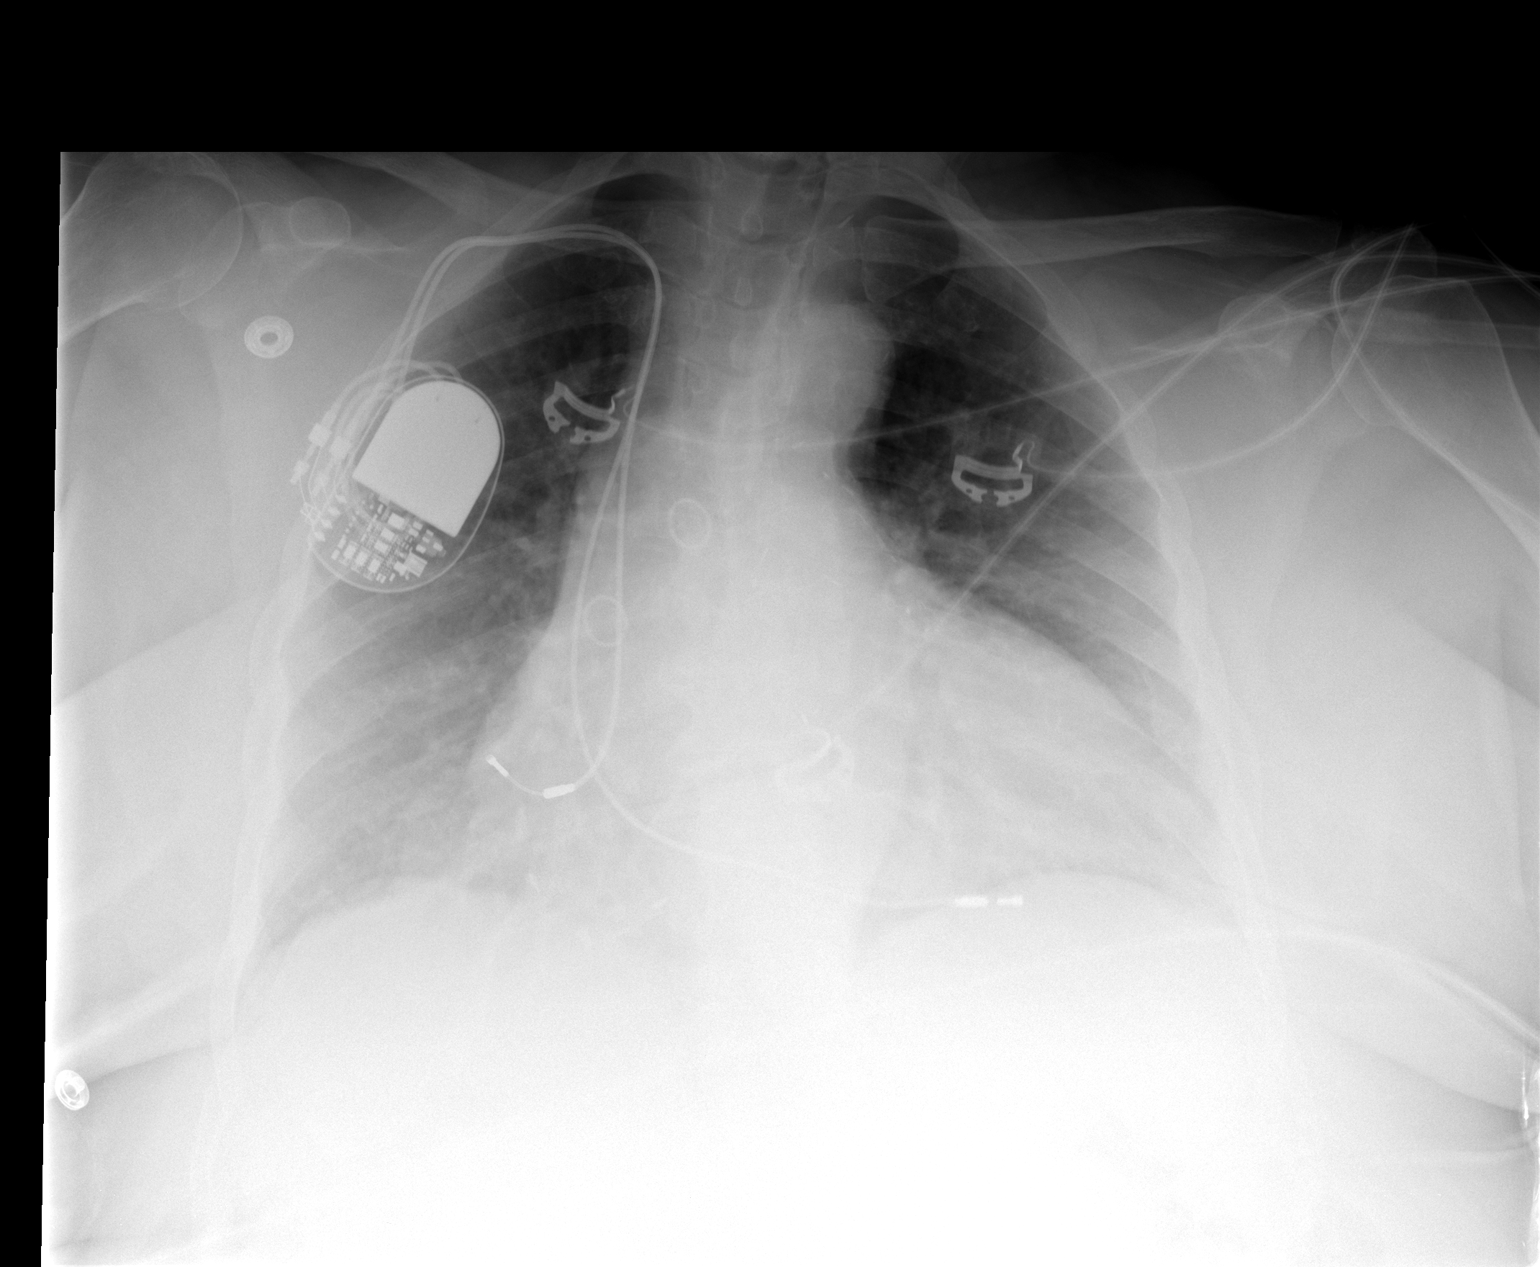

[1 of 1 positions shown; findings below may reference images not displayed]

FINDINGS: Right subclavian transvenous sequential pacemaker leads stable,
projecting over right atrium and right ventricle.
Enlargement of cardiac silhouette with postsurgical changes of
CABG.
Tortuous aorta.
Slight pulmonary vascular congestion.
No acute failure consolidation.
Bones unremarkable.
No pneumothorax.
Portion of right lung obscured by pacemaker pack.
IMPRESSION: Enlargement of cardiac silhouette with slight pulmonary vascular
congestion post CABG and pacemaker.
No acute abnormalities.

## 2012-11-20 ENCOUNTER — Encounter: Payer: Self-pay | Admitting: Internal Medicine

## 2012-11-21 ENCOUNTER — Encounter: Payer: Self-pay | Admitting: Gastroenterology

## 2012-11-21 ENCOUNTER — Ambulatory Visit (INDEPENDENT_AMBULATORY_CARE_PROVIDER_SITE_OTHER): Payer: PRIVATE HEALTH INSURANCE | Admitting: Gastroenterology

## 2012-11-21 VITALS — BP 136/88 | HR 82 | Temp 98.1°F | Ht 62.0 in | Wt 198.6 lb

## 2012-11-21 DIAGNOSIS — R109 Unspecified abdominal pain: Secondary | ICD-10-CM

## 2012-11-21 DIAGNOSIS — R112 Nausea with vomiting, unspecified: Secondary | ICD-10-CM

## 2012-11-21 DIAGNOSIS — R1030 Lower abdominal pain, unspecified: Secondary | ICD-10-CM

## 2012-11-21 DIAGNOSIS — D649 Anemia, unspecified: Secondary | ICD-10-CM

## 2012-11-21 DIAGNOSIS — K259 Gastric ulcer, unspecified as acute or chronic, without hemorrhage or perforation: Secondary | ICD-10-CM

## 2012-11-21 DIAGNOSIS — K3184 Gastroparesis: Secondary | ICD-10-CM

## 2012-11-21 MED ORDER — PANTOPRAZOLE SODIUM 40 MG PO TBEC: 40.0000 mg | DELAYED_RELEASE_TABLET | Freq: Every day | ORAL | Status: DC

## 2012-11-21 NOTE — Assessment & Plan Note (Signed)
H/O chronic gastroparesis dated back to 2007. Does not tolerate Reglan due to confusion. Recent EGD showed partially healed gastric ulcer and gastric erosions. Unfortunately, she is not on a PPI. She reports that she is no longer on NSAIDs. C/O vomiting every evening after going to bed but does ok during the day. Discussed gastroparesis diet with patient. Start pantoprazole 40mg  daily. We are trying to clarify with [facility] what her current medications are. If we need to add Zofran at night time we will. OV in 6 weeks to reassess. She will call sooner if needed. Will decide if she needs f/u EGD to verify gastric ulcer healing. Will discuss with Dr. Jena Gauss.

## 2012-11-21 NOTE — Patient Instructions (Addendum)
Please have your blood work done as soon as you can. We have requested a copy of your current medications from [facility]. Once I have reviewed these, I will send prescription in for nausea/vomiting. We have sent prescription for your stomach ulcer to [facility], pantoprazole, take one daily before breakfast.  Please return to our office in six weeks for follow up. Please call sooner if your nausea/vomiting and abdominal pain do not improve.

## 2012-11-21 NOTE — Progress Notes (Signed)
Faxed to PCP

## 2012-11-21 NOTE — Assessment & Plan Note (Signed)
She had CT in 09/2012 which suggested enteritis?. Mild stranding of SB in RLQ. Does not have symptoms suggestive of ongoing infectious process or IBD. Will discuss further with Dr. Jena Gauss. Her last colonoscopy was in 2006 as outlined above.

## 2012-11-21 NOTE — Progress Notes (Signed)
Reviewed med list from [facility]. Updated medication list. We need to find out if patient is still taking oxycodone and fentanyl patch. It is also not clear to me if she is taking lasix regularly. Showed last fill date of 09/04/12.   Please let patient know she has gotten phenergan and zofran filled on 11/09/12 which are both for N/V therefore I am not sending anything else in for that.

## 2012-11-21 NOTE — Addendum Note (Signed)
Addended by: Tiffany Kocher on: 11/21/2012 03:10 PM   Modules accepted: Orders

## 2012-11-21 NOTE — Progress Notes (Addendum)
Primary Care Physician: Fredirick Maudlin, MD  Primary Gastroenterologist:  Roetta Sessions, MD   Chief Complaint  Patient presents with  . Follow-up    from hospital    HPI: [name] is a 71 y.o. female here for hospital f/u. She was seen back in 09/2012 for N/V. EGD showed noncritical Schatzki's ring, diffuse gastric erosions and area of partially healing ulceration (reactive gastropathy on bx). She was advised to stop ketoprofen. Notably she has h/o diabetic gastroparesis but was not taking Reglan at home anymore. No PPI listed on home meds at that time. During her hospitalization, she was started back on low-dose reglan but developed confusion therefore it was stopped. Her pantoprazole was increased to BID. Unfortunately, when she was discharged, pantoprazole was not on her d/c med list. We have called [facility] in [place] today to get updated med list as patient did not bring meds with her.  Notably, couple of days after her discharge, she went back to ED for N/V/D. CT A/P with contrast showed mild stranding around small bowel in RLQ without obstruction. ?enteritis although felt to be nonspecific. During recent hospitalization her C.Diff was negative.  She states she is not taking anything for vomiting, her ulcers, gerd, or for pain. She reports vomiting just about every night after goes to bed. Emesis consists of partially digested food. No heartburn. No dysphagia. Takes Senokot and MOM daily to keep bowels moving. No melena, brbpr. Lower abdominal pain, most days. Usually worse after eats. Crampy. Last for several hours at a time.   Current Outpatient Prescriptions  Medication Sig Dispense Refill  . aspirin EC 81 MG tablet Take 81 mg by mouth daily.      Marland Kitchen atorvastatin (LIPITOR) 40 MG tablet Take 1 tablet (40 mg total) by mouth daily at 6 PM.  30 tablet  6  . benazepril (LOTENSIN) 20 MG tablet Take 20 mg by mouth 2 (two) times daily.      . Bromfenac Sodium (BROMDAY) 0.09 %  SOLN Place 1 drop into the right eye 3 (three) times daily.      Marland Kitchen doxepin (SINEQUAN) 25 MG capsule Take 25 mg by mouth at bedtime.      Marland Kitchen estradiol (ESTRACE) 0.5 MG tablet Take 0.5 mg by mouth daily.        Marland Kitchen gabapentin (NEURONTIN) 300 MG capsule Take 300 mg by mouth 3 (three) times daily.      . metFORMIN (GLUCOPHAGE) 500 MG tablet Take 500 mg by mouth 2 (two) times daily with a meal.       . metoprolol tartrate (LOPRESSOR) 25 MG tablet Take 1 tablet (25 mg total) by mouth 2 (two) times daily.  30 tablet  6  . ondansetron (ZOFRAN-ODT) 8 MG disintegrating tablet Take 8 mg by mouth every 8 (eight) hours as needed. Nausea and Vomiting      . phenytoin (DILANTIN) 100 MG ER capsule Take 300 mg by mouth at bedtime.        . potassium chloride (K-DUR) 10 MEQ tablet Take 2 tablets (20 mEq total) by mouth daily.  60 tablet  3  . promethazine (PHENERGAN) 25 MG tablet Take 1 tablet (25 mg total) by mouth every 6 (six) hours as needed for nausea.  15 tablet  0  . senna-docusate (SENOKOT-S) 8.6-50 MG per tablet Take 1 tablet by mouth daily as needed.      Marland Kitchen amLODipine (NORVASC) 10 MG tablet Take 10 mg by mouth daily.      Marland Kitchen  hydrALAZINE (APRESOLINE) 25 MG tablet Take 1 tablet (25 mg total) by mouth every 8 (eight) hours.  90 tablet  6  . nitroGLYCERIN (NITROSTAT) 0.4 MG SL tablet Place 1 tablet (0.4 mg total) under the tongue every 5 (five) minutes as needed for chest pain (Up to 3 doses).  25 tablet  3  MOM: couple of tablespoons daily for constipation Fentanyl patch q 72 hours.   Allergies as of 11/21/2012 - Review Complete 11/21/2012  Allergen Reaction Noted  . Codeine  05/13/2009  . Other  03/23/2012  . Oxycodone hcl     Past Medical History  Diagnosis Date  . Diabetes mellitus, type II   . Hypertension   . Arteriosclerotic cardiovascular disease (ASCVD)     multivessel, CABG 6/10.  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  Myoview (11/20/11) showed no ischemia & EF 62%  . Gastroesophageal reflux disease     Hiatal  hernia  . Hyperlipidemia   . Deep vein thrombosis, upper right extremity     post-PPM  . Obesity   . DDD (degenerative disc disease)   . 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 Dehiscence of closure of sternum or sternotomy 07/2009    sternal infection; required flap closure  . COPD (chronic obstructive pulmonary disease)     severe lung disease by PFTs 6/12  . Sleep apnea   . Seizure disorder    Past Surgical History  Procedure Date  . Pacemaker placement 1993    Status post DDD pacemaker implantation in 1993, with Medtronic Kappa generator change in 2003 for  second-degree AV block, most recent gen change by Fawn Kirk 04/14/11  . Revision total hip arthroplasty   . Replacement total knee     Left  . Thyroid surgery   . Wrist surgery   . Coronary artery bypass graft 05/1999 and    - LIMA to LAD, SVG to OM, SVG to PDA  . Reconstructive repair sternal     for infection s/p CABG 8/10  . Back surgery   . Esophagogastroduodenoscopy     with [facility] dilation,  biopsy, and disruption Schatzki's ring  . Colonoscopy 04/27/2005    hemorrhoids, diverticula  . Esophagogastroduodenoscopy 09/10/2012    RMR: Noncritical Schatzki's ring. Diffuse gastric erosions and an  area of partially healing ulceration-status post biopsy/ Small hiatal hernia. Bx reactive gastropathy.  . Abdominal hysterectomy    Family History  Problem Relation Age of Onset  . Diabetes    . Heart disease Mother     MI  . Coronary artery disease      Female <55  . Cancer     History   Social History  . Marital Status: Married    Spouse Name: N/A    Number of Children: N/A  . Years of Education: N/A   Social History Main Topics  . Smoking status: Never Smoker   . Smokeless tobacco: Never Used  . Alcohol Use: No  . Drug Use: No  . Sexually Active: Not Currently    Birth Control/ Protection: Post-menopausal, Surgical   Other Topics Concern  . None   Social History  Narrative  . None    ROS:  General: Negative for anorexia, fever, chills, fatigue, weakness. Weight down but in part due to diuresis I suspect. Weight 196lb 2009, 250lb 08/2012, 216lb 10/2012, 198lb today. ENT: Negative for hoarseness, difficulty swallowing , nasal congestion. CV: Negative for chest pain, angina, palpitations, dyspnea on exertion,  peripheral edema.  Respiratory: Negative for dyspnea at rest, dyspnea on exertion, cough, sputum, wheezing.  GI: See history of present illness. GU:  Negative for dysuria, hematuria, urinary incontinence, urinary frequency, nocturnal urination.  Endo: Negative for unusual weight change.    Physical Examination:   BP 136/88  Pulse 82  Temp 98.1 F (36.7 C) (Oral)  Ht 5\' 2"  (1.575 m)  Wt 198 lb 9.6 oz (90.084 kg)  BMI 36.32 kg/m2  General: Well-nourished, well-developed in no acute distress.  Eyes: No icterus. Mouth: Oropharyngeal mucosa moist and pink , no lesions erythema or exudate. Lungs: Clear to auscultation bilaterally.  Heart: Regular rate and rhythm, no murmurs rubs or gallops.  Abdomen: Bowel sounds are normal, mild epigastric tenderness, nondistended, no hepatosplenomegaly or masses, no abdominal bruits or hernia , no rebound or guarding.   Extremities: No lower extremity edema. No clubbing or deformities. Neuro: Alert and oriented x 4   Skin: Warm and dry, no jaundice.   Psych: Alert and cooperative, normal mood and affect.  Labs:  Lab Results  Component Value Date   WBC 9.8 11/21/2012   HGB 11.4* 11/21/2012   HCT 34.6* 11/21/2012   MCV 81.8 11/21/2012   PLT 231 11/21/2012    Imaging Studies: No results found.

## 2012-11-21 NOTE — Assessment & Plan Note (Signed)
Recheck CBC now 

## 2012-11-22 NOTE — Progress Notes (Signed)
Discussed with Dr. Jena Gauss. She will need f/u EGD to verify gastric healing. Just started PPI and she has f/u scheduled in 12/2012. Can arrange EGD at that time.   Right now no need for colonoscopy. Last one in 2006. Await CBC results.

## 2012-11-23 LAB — CBC WITH DIFFERENTIAL/PLATELET
Eosinophils Absolute: 0.2 10*3/uL (ref 0.0–0.7)
Eosinophils Relative: 2 % (ref 0–5)
HCT: 34.6 % — ABNORMAL LOW (ref 36.0–46.0)
Lymphocytes Relative: 27 % (ref 12–46)
Lymphs Abs: 2.6 10*3/uL (ref 0.7–4.0)
MCH: 27 pg (ref 26.0–34.0)
MCV: 81.8 fL (ref 78.0–100.0)
Monocytes Absolute: 0.9 10*3/uL (ref 0.1–1.0)
Platelets: 231 10*3/uL (ref 150–400)
RBC: 4.23 MIL/uL (ref 3.87–5.11)
WBC: 9.8 10*3/uL (ref 4.0–10.5)

## 2012-11-23 NOTE — Progress Notes (Signed)
Quick Note:  H/H better. Recheck in 3 months. ______

## 2012-11-23 NOTE — Progress Notes (Signed)
Pt aware.

## 2012-11-26 ENCOUNTER — Other Ambulatory Visit: Payer: Self-pay | Admitting: Gastroenterology

## 2012-11-26 ENCOUNTER — Ambulatory Visit: Payer: PRIVATE HEALTH INSURANCE | Admitting: Cardiology

## 2012-11-26 ENCOUNTER — Encounter: Payer: Self-pay | Admitting: Cardiology

## 2012-11-26 DIAGNOSIS — G4730 Sleep apnea, unspecified: Secondary | ICD-10-CM | POA: Insufficient documentation

## 2012-11-26 DIAGNOSIS — E785 Hyperlipidemia, unspecified: Secondary | ICD-10-CM | POA: Insufficient documentation

## 2012-11-26 DIAGNOSIS — D649 Anemia, unspecified: Secondary | ICD-10-CM

## 2012-11-26 DIAGNOSIS — G40909 Epilepsy, unspecified, not intractable, without status epilepticus: Secondary | ICD-10-CM | POA: Insufficient documentation

## 2012-11-26 DIAGNOSIS — E669 Obesity, unspecified: Secondary | ICD-10-CM | POA: Insufficient documentation

## 2012-11-26 DIAGNOSIS — I4430 Unspecified atrioventricular block: Secondary | ICD-10-CM | POA: Insufficient documentation

## 2012-11-26 DIAGNOSIS — N183 Chronic kidney disease, stage 3 (moderate): Secondary | ICD-10-CM

## 2012-11-26 DIAGNOSIS — I82621 Acute embolism and thrombosis of deep veins of right upper extremity: Secondary | ICD-10-CM | POA: Insufficient documentation

## 2012-11-26 DIAGNOSIS — I2510 Atherosclerotic heart disease of native coronary artery without angina pectoris: Secondary | ICD-10-CM | POA: Insufficient documentation

## 2012-11-26 DIAGNOSIS — E119 Type 2 diabetes mellitus without complications: Secondary | ICD-10-CM | POA: Insufficient documentation

## 2012-11-26 DIAGNOSIS — I10 Essential (primary) hypertension: Secondary | ICD-10-CM | POA: Insufficient documentation

## 2012-11-26 DIAGNOSIS — K219 Gastro-esophageal reflux disease without esophagitis: Secondary | ICD-10-CM | POA: Insufficient documentation

## 2012-11-26 DIAGNOSIS — J449 Chronic obstructive pulmonary disease, unspecified: Secondary | ICD-10-CM | POA: Insufficient documentation

## 2012-11-26 NOTE — Progress Notes (Signed)
Quick Note:  Pt aware, lab order on file. ______ 

## 2012-11-26 NOTE — Progress Notes (Signed)
Pt stated she is not taking any fluid pills and she just got her fentanyl patches today, but is not taking any other pain pills.

## 2012-11-26 NOTE — Progress Notes (Signed)
[name], please find out if patient is taking the Fentanyl patch and oxycodone. Is she taking lasix regularly?

## 2012-11-27 NOTE — Progress Notes (Signed)
Will send this note to Dr. Lewayne Bunting- her Cardiologist.

## 2012-11-27 NOTE — Progress Notes (Signed)
Updated med list and OV note to show current meds.  She needs to notify cardiology that she is not taking her fluid pills (lasix). They have been adjusted her potassium dose within the last couple of weeks based on assumption she is taking high dose lasix.

## 2012-12-04 ENCOUNTER — Ambulatory Visit: Payer: PRIVATE HEALTH INSURANCE | Admitting: Adult Health

## 2012-12-18 ENCOUNTER — Other Ambulatory Visit (HOSPITAL_COMMUNITY): Payer: Self-pay | Admitting: Nurse Practitioner

## 2012-12-26 ENCOUNTER — Ambulatory Visit: Payer: PRIVATE HEALTH INSURANCE | Admitting: Adult Health

## 2012-12-28 ENCOUNTER — Encounter: Payer: Self-pay | Admitting: *Deleted

## 2013-01-02 ENCOUNTER — Ambulatory Visit: Payer: PRIVATE HEALTH INSURANCE | Admitting: Gastroenterology

## 2013-01-02 ENCOUNTER — Ambulatory Visit: Payer: PRIVATE HEALTH INSURANCE | Admitting: Physician Assistant

## 2013-01-11 ENCOUNTER — Encounter: Payer: Self-pay | Admitting: Adult Health

## 2013-01-11 ENCOUNTER — Ambulatory Visit (INDEPENDENT_AMBULATORY_CARE_PROVIDER_SITE_OTHER): Payer: PRIVATE HEALTH INSURANCE | Admitting: Adult Health

## 2013-01-11 VITALS — BP 116/67 | HR 60 | Ht 61.5 in | Wt 202.2 lb

## 2013-01-11 DIAGNOSIS — I5032 Chronic diastolic (congestive) heart failure: Secondary | ICD-10-CM

## 2013-01-11 DIAGNOSIS — I2582 Chronic total occlusion of coronary artery: Secondary | ICD-10-CM

## 2013-01-11 DIAGNOSIS — I7090 Unspecified atherosclerosis: Secondary | ICD-10-CM

## 2013-01-11 DIAGNOSIS — I10 Essential (primary) hypertension: Secondary | ICD-10-CM

## 2013-01-11 DIAGNOSIS — D649 Anemia, unspecified: Secondary | ICD-10-CM

## 2013-01-11 DIAGNOSIS — I2510 Atherosclerotic heart disease of native coronary artery without angina pectoris: Secondary | ICD-10-CM

## 2013-01-11 LAB — BASIC METABOLIC PANEL WITH GFR
BUN: 40 mg/dL — ABNORMAL HIGH (ref 6–23)
CO2: 28 meq/L (ref 19–32)
Calcium: 9.3 mg/dL (ref 8.4–10.5)
Chloride: 96 meq/L (ref 96–112)
Creat: 2.29 mg/dL — ABNORMAL HIGH (ref 0.50–1.10)
Glucose, Bld: 101 mg/dL — ABNORMAL HIGH (ref 70–99)
Potassium: 3.2 meq/L — ABNORMAL LOW (ref 3.5–5.3)
Sodium: 134 meq/L — ABNORMAL LOW (ref 135–145)

## 2013-01-11 LAB — CBC
MCHC: 33.1 g/dL (ref 30.0–36.0)
Platelets: 238 10*3/uL (ref 150–400)
RDW: 15.9 % — ABNORMAL HIGH (ref 11.5–15.5)

## 2013-01-11 NOTE — Progress Notes (Signed)
HPI: [name] is a 72 y/o patient of Dr's Butts Bing and .Lewayne Bunting we are following for ongoing assessment and treatment of CAD, s/p CABG 6/10, second degree HB pacemaker in situ, hypertension, hyperlipidemia, and chronic diastolic CHF, with history of seizure disorder, COPD, GERD and right arm DVT.  She comes today on follow up with complaints of gastritis symptoms. She is due to see Dr.Rourke but does not remember when her appointment is. She does not know the names of her medications, or how often she is taking them. She has a CNA who assists her at home and also a daughter who helps her with her medications. She was last seen by our office in Nov of 2013 with fluid overload and hypertension. She was adjusted in her medications at that time. She comes today having gained 4 lbs since last visit, but with much better controlled BP. She states she feels weak from not being able to eat well, with constant nausea, but no abdominal pain.  Allergies  Allergen Reactions  . Codeine     "good" headache  . Other     Leafy raw vegetables and seeds  . Oxycodone Hcl     "good" headache; also OxyContin    Current Outpatient Prescriptions  Medication Sig Dispense Refill  . amLODipine (NORVASC) 10 MG tablet Take 10 mg by mouth daily.      Marland Kitchen aspirin EC 81 MG tablet Take 81 mg by mouth daily.      Marland Kitchen atorvastatin (LIPITOR) 40 MG tablet TAKE ONE TABLET BY MOUTH EVERY DAY AT 6PM  30 tablet  5  . Bromfenac Sodium (BROMDAY) 0.09 % SOLN Place 1 drop into the right eye 3 (three) times daily.      Marland Kitchen doxepin (SINEQUAN) 25 MG capsule Take 25 mg by mouth at bedtime.      Marland Kitchen estradiol (ESTRACE) 0.5 MG tablet Take 0.5 mg by mouth daily.        . fentaNYL (DURAGESIC - DOSED MCG/HR) 100 MCG/HR Place 1 patch onto the skin every 3 (three) days.      . hydrALAZINE (APRESOLINE) 25 MG tablet Take 25 mg by mouth every 8 (eight) hours.      . magnesium hydroxide (MILK OF MAGNESIA) 400 MG/5ML suspension Takes couple of  tablespoons daily.      . metFORMIN (GLUCOPHAGE) 500 MG tablet Take 500 mg by mouth 2 (two) times daily with a meal.       . metoprolol tartrate (LOPRESSOR) 25 MG tablet Take 1 tablet (25 mg total) by mouth 2 (two) times daily.  30 tablet  6  . nitroGLYCERIN (NITROSTAT) 0.4 MG SL tablet Place 0.4 mg under the tongue every 5 (five) minutes as needed.      . ondansetron (ZOFRAN) 8 MG tablet Take 8 mg by mouth every 8 (eight) hours as needed.      . ondansetron (ZOFRAN-ODT) 8 MG disintegrating tablet Take 8 mg by mouth every 8 (eight) hours as needed. Nausea and Vomiting      . pantoprazole (PROTONIX) 40 MG tablet Take 1 tablet (40 mg total) by mouth daily.  30 tablet  11  . phenytoin (DILANTIN) 100 MG ER capsule Take 300 mg by mouth at bedtime.        . potassium chloride (K-DUR) 10 MEQ tablet Take 2 tablets (20 mEq total) by mouth daily.  60 tablet  3  . promethazine (PHENERGAN) 25 MG tablet Take 1 tablet (25 mg total) by mouth  every 6 (six) hours as needed for nausea.  15 tablet  0  . senna-docusate (SENOKOT-S) 8.6-50 MG per tablet Take 1 tablet by mouth daily as needed.        Past Medical History  Diagnosis Date  . Diabetes mellitus, type II   . Hypertension   . Arteriosclerotic cardiovascular disease (ASCVD)     multivessel, CABG 6/10.  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  Myoview (11/20/11) showed no ischemia & EF 62%  . Gastroesophageal reflux disease     Hiatal hernia  . Hyperlipidemia   . Deep vein thrombosis, upper right extremity     post-PPM  . Obesity   . DDD (degenerative disc disease)   . 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 Dehiscence of closure of sternum or sternotomy 07/2009    sternal infection; required flap closure  . COPD (chronic obstructive pulmonary disease)     severe lung disease by PFTs 6/12  . Sleep apnea   . Seizure disorder     Past Surgical History  Procedure Date  . Pacemaker placement 1993    Status post DDD  pacemaker implantation in 1993, with Medtronic Kappa generator change in 2003 for  second-degree AV block, most recent gen change by Fawn Kirk 04/14/11  . Revision total hip arthroplasty   . Replacement total knee     Left  . Thyroid surgery   . Wrist surgery   . Coronary artery bypass graft 05/1999 and    - LIMA to LAD, SVG to OM, SVG to PDA  . Reconstructive repair sternal     for infection s/p CABG 8/10  . Back surgery   . Esophagogastroduodenoscopy     with [facility] dilation,  biopsy, and disruption Schatzki's ring  . Colonoscopy 04/27/2005    hemorrhoids, diverticula  . Esophagogastroduodenoscopy 09/10/2012    RMR: Noncritical Schatzki's ring. Diffuse gastric erosions and an  area of partially healing ulceration-status post biopsy/ Small hiatal hernia. Bx reactive gastropathy.  . Abdominal hysterectomy     MWU:XLKGMW of systems complete and found to be negative unless listed above  PHYSICAL EXAM BP 116/67  Pulse 60  Ht 5' 1.5" (1.562 m)  Wt 202 lb 4 oz (91.74 kg)  BMI 37.60 kg/m2  SpO2 95%  General: Well developed, well nourished, in no acute distress Head: Eyes PERRLA, No xanthomas.   Normal cephalic and atramatic  Lungs: Clear bilaterally to auscultation and percussion. Heart: HRRR S1 S2,  Soft 1/6 systolic murmur.  Pulses are 2+ & equal.            No carotid bruit. No JVD.  No abdominal bruits. No femoral bruits. Abdomen: Bowel sounds are positive, abdomen soft and non-tender without masses or                  Hernia's noted. Msk:  Back normal, normal gait. Normal strength and tone for age. Extremities: No clubbing, cyanosis or edema.  DP +1 Neuro: Alert and oriented X 3. Psych:  Good affect, responds appropriately    ASSESSMENT AND PLAN

## 2013-01-11 NOTE — Assessment & Plan Note (Signed)
She offers no cardiac complaints of chest pain or DOE other than baseline breathing status with COPD. Continue risk management.

## 2013-01-11 NOTE — Assessment & Plan Note (Signed)
Recheck CBC for ongoing assessment. She is on iron replacement.

## 2013-01-11 NOTE — Patient Instructions (Addendum)
Your physician recommends that you schedule a follow-up appointment in: 6 MONTHS WITH KL  Your physician recommends that you return for lab work in: TODAY (SLIPS GIVEN) BMET, CBC  FOLLOW UP WITH DR Jena Gauss   WE WILL CONTACT [facility] TO HAVE SOMEONE CALL YOU WITH A DATE AND TIME TO COME OUT TO YOUR HOME TO EVALUATE YOUR NEEDS

## 2013-01-11 NOTE — Assessment & Plan Note (Signed)
She appears well compensated on this visit. No clinical evidence of fluid overload. Continue current medication regimen. I will check a BMET for kidney fx.

## 2013-01-11 NOTE — Progress Notes (Deleted)
Name: [name]    DOB: [birth year]  Age: 72 y.o.  MR#: [id number]       PCP:  Fredirick Maudlin, MD      Insurance: @PAYORNAME @   CC:   No chief complaint on file.   VS BP 116/67  Pulse 60  Ht 5' 1.5" (1.562 m)  Wt 202 lb 4 oz (91.74 kg)  BMI 37.60 kg/m2  SpO2 95%  Weights Current Weight  01/11/13 202 lb 4 oz (91.74 kg)  11/21/12 198 lb 9.6 oz (90.084 kg)  10/24/12 216 lb (97.977 kg)    Blood Pressure  BP Readings from Last 3 Encounters:  01/11/13 116/67  11/21/12 136/88  10/24/12 170/110     Admit date:  (Not on file) Last encounter with RMR:  Visit date not found   Allergy Allergies  Allergen Reactions  . Codeine     "good" headache  . Other     Leafy raw vegetables and seeds  . Oxycodone Hcl     "good" headache; also OxyContin    Current Outpatient Prescriptions  Medication Sig Dispense Refill  . amLODipine (NORVASC) 10 MG tablet Take 10 mg by mouth daily.      Marland Kitchen aspirin EC 81 MG tablet Take 81 mg by mouth daily.      Marland Kitchen atorvastatin (LIPITOR) 40 MG tablet TAKE ONE TABLET BY MOUTH EVERY DAY AT 6PM  30 tablet  5  . Bromfenac Sodium (BROMDAY) 0.09 % SOLN Place 1 drop into the right eye 3 (three) times daily.      Marland Kitchen doxepin (SINEQUAN) 25 MG capsule Take 25 mg by mouth at bedtime.      Marland Kitchen estradiol (ESTRACE) 0.5 MG tablet Take 0.5 mg by mouth daily.        . fentaNYL (DURAGESIC - DOSED MCG/HR) 100 MCG/HR Place 1 patch onto the skin every 3 (three) days.      . hydrALAZINE (APRESOLINE) 25 MG tablet Take 25 mg by mouth every 8 (eight) hours.      . magnesium hydroxide (MILK OF MAGNESIA) 400 MG/5ML suspension Takes couple of tablespoons daily.      . metFORMIN (GLUCOPHAGE) 500 MG tablet Take 500 mg by mouth 2 (two) times daily with a meal.       . metoprolol tartrate (LOPRESSOR) 25 MG tablet Take 1 tablet (25 mg total) by mouth 2 (two) times daily.  30 tablet  6  . nitroGLYCERIN (NITROSTAT) 0.4 MG SL tablet Place 0.4 mg under the tongue every 5 (five) minutes as  needed.      . ondansetron (ZOFRAN) 8 MG tablet Take 8 mg by mouth every 8 (eight) hours as needed.      . ondansetron (ZOFRAN-ODT) 8 MG disintegrating tablet Take 8 mg by mouth every 8 (eight) hours as needed. Nausea and Vomiting      . pantoprazole (PROTONIX) 40 MG tablet Take 1 tablet (40 mg total) by mouth daily.  30 tablet  11  . phenytoin (DILANTIN) 100 MG ER capsule Take 300 mg by mouth at bedtime.        . potassium chloride (K-DUR) 10 MEQ tablet Take 2 tablets (20 mEq total) by mouth daily.  60 tablet  3  . promethazine (PHENERGAN) 25 MG tablet Take 1 tablet (25 mg total) by mouth every 6 (six) hours as needed for nausea.  15 tablet  0  . senna-docusate (SENOKOT-S) 8.6-50 MG per tablet Take 1 tablet by mouth daily as needed.  Discontinued Meds:    Medications Discontinued During This Encounter  Medication Reason  . nitroGLYCERIN (NITROSTAT) 0.4 MG SL tablet   . hydrALAZINE (APRESOLINE) 25 MG tablet     Patient Active Problem List  Diagnosis  . SCHATZKI'S RING  . GASTRIC ULCER  . Gastroparesis  . DIVERTICULAR DISEASE  . PACEMAKER, PERMANENT  . Chronic diastolic heart failure  . Anemia, normocytic normochromic  . Diabetes mellitus, type II  . Hypertension  . Arteriosclerotic cardiovascular disease (ASCVD)  . Gastroesophageal reflux disease  . Hyperlipidemia  . Deep vein thrombosis, upper right extremity  . Obesity  . AV block  . COPD (chronic obstructive pulmonary disease)  . Sleep apnea  . Seizure disorder    LABS Office Visit on 11/21/2012  Component Date Value  . WBC 11/21/2012 9.8   . RBC 11/21/2012 4.23   . Hemoglobin 11/21/2012 11.4*  . HCT 11/21/2012 34.6*  . MCV 11/21/2012 81.8   . [facility] 11/21/2012 27.0   . MCHC 11/21/2012 32.9   . RDW 11/21/2012 14.9   . Platelets 11/21/2012 231   . Neutrophils Relative 11/21/2012 61   . Neutro Abs 11/21/2012 6.1   . Lymphocytes Relative 11/21/2012 27   . Lymphs Abs 11/21/2012 2.6   . Monocytes Relative  11/21/2012 9   . Monocytes Absolute 11/21/2012 0.9   . Eosinophils Relative 11/21/2012 2   . Eosinophils Absolute 11/21/2012 0.2   . Basophils Relative 11/21/2012 1   . Basophils Absolute 11/21/2012 0.1   . Smear Review 11/21/2012    Orders Only on 11/05/2012  Component Date Value  . Cholesterol 11/05/2012 241*  . Triglycerides 11/05/2012 136   . HDL 11/05/2012 40   . Total CHOL/HDL Ratio 11/05/2012 6.0   . VLDL 11/05/2012 27   . LDL Cholesterol 11/05/2012 174*  . Total Bilirubin 11/05/2012 0.6   . Bilirubin, Direct 11/05/2012 0.2   . Indirect Bilirubin 11/05/2012 0.4   . Alkaline Phosphatase 11/05/2012 71   . AST 11/05/2012 17   . ALT 11/05/2012 10   . Total Protein 11/05/2012 7.6   . Albumin 11/05/2012 4.4   Orders Only on 11/05/2012  Component Date Value  . Sodium 11/05/2012 137   . Potassium 11/05/2012 3.4*  . Chloride 11/05/2012 100   . CO2 11/05/2012 23   . Glucose, Bld 11/05/2012 132*  . BUN 11/05/2012 8   . Creat 11/05/2012 1.00   . Calcium 11/05/2012 10.2   . TSH 11/05/2012 0.922      Results for this Opt Visit:     Results for orders placed in visit on 11/21/12  CBC WITH DIFFERENTIAL      Component Value Range   WBC 9.8  4.0 - 10.5 K/uL   RBC 4.23  3.87 - 5.11 MIL/uL   Hemoglobin 11.4 (*) 12.0 - 15.0 g/dL   HCT 40.9 (*) 81.1 - 91.4 %   MCV 81.8  78.0 - 100.0 fL   MCH 27.0  26.0 - 34.0 pg   MCHC 32.9  30.0 - 36.0 g/dL   RDW 78.2  95.6 - 21.3 %   Platelets 231  150 - 400 K/uL   Neutrophils Relative 61  43 - 77 %   Neutro Abs 6.1  1.7 - 7.7 K/uL   Lymphocytes Relative 27  12 - 46 %   Lymphs Abs 2.6  0.7 - 4.0 K/uL   Monocytes Relative 9  3 - 12 %   Monocytes Absolute 0.9  0.1 - 1.0 K/uL  Eosinophils Relative 2  0 - 5 %   Eosinophils Absolute 0.2  0.0 - 0.7 K/uL   Basophils Relative 1  0 - 1 %   Basophils Absolute 0.1  0.0 - 0.1 K/uL   Smear Review        EKG Orders placed during the hospital encounter of 09/07/12  . EKG     Prior Assessment  and Plan Problem List as of 01/11/2013          [facility] RING   GASTRIC ULCER   Last Assessment & Plan Note   11/18/2011 Office Visit Signed 11/18/2011  9:00 AM by Beatrice Lecher, PA    Continue PPI.    Gastroparesis   DIVERTICULAR DISEASE   PACEMAKER, PERMANENT   Last Assessment & Plan Note   10/24/2012 Office Visit Addendum 10/24/2012 10:12 AM by Dyann Kief, PA    Follow up with Dr. Johney Frame for pacer     Chronic diastolic heart failure   Last Assessment & Plan Note   07/20/2012 Office Visit Signed 07/20/2012 12:03 PM by Marinus Maw, MD    At least a portion of her symptoms are related to chronic diastolic heart failure. She admits to dietary indiscretion. I've asked the patient to reduce her salt intake and to increase her Lasix to 80 mg twice daily. In addition, she is encouraged to increase her physical activity.    Anemia, normocytic normochromic   Last Assessment & Plan Note   11/21/2012 Office Visit Signed 11/21/2012  1:57 PM by Tiffany Kocher, PA    Recheck CBC now.     Diabetes mellitus, type II   Hypertension   Arteriosclerotic cardiovascular disease (ASCVD)   Gastroesophageal reflux disease   Hyperlipidemia   Deep vein thrombosis, upper right extremity   Obesity   AV block   COPD (chronic obstructive pulmonary disease)   Sleep apnea   Seizure disorder       Imaging: No results found.   FRS Calculation: Score not calculated. Missing: Total Cholesterol

## 2013-01-11 NOTE — Assessment & Plan Note (Signed)
Much better controlled on medication adjustment. NO changes at this time.

## 2013-01-14 ENCOUNTER — Other Ambulatory Visit: Payer: Self-pay | Admitting: Neurology

## 2013-01-14 DIAGNOSIS — M545 Low back pain: Secondary | ICD-10-CM

## 2013-01-16 ENCOUNTER — Other Ambulatory Visit: Payer: PRIVATE HEALTH INSURANCE

## 2013-01-17 ENCOUNTER — Other Ambulatory Visit: Payer: PRIVATE HEALTH INSURANCE

## 2013-01-18 ENCOUNTER — Other Ambulatory Visit: Payer: PRIVATE HEALTH INSURANCE

## 2013-01-21 ENCOUNTER — Other Ambulatory Visit: Payer: Self-pay

## 2013-01-21 DIAGNOSIS — D649 Anemia, unspecified: Secondary | ICD-10-CM

## 2013-01-22 ENCOUNTER — Ambulatory Visit: Payer: PRIVATE HEALTH INSURANCE | Admitting: Gastroenterology

## 2013-01-24 ENCOUNTER — Ambulatory Visit
Admission: RE | Admit: 2013-01-24 | Discharge: 2013-01-24 | Disposition: A | Discharge status: Home / Self Care | Payer: PRIVATE HEALTH INSURANCE | Source: Ambulatory Visit | Attending: Neurology | Admitting: Neurology

## 2013-01-31 ENCOUNTER — Ambulatory Visit: Payer: PRIVATE HEALTH INSURANCE | Admitting: Orthopedic Surgery

## 2013-02-04 ENCOUNTER — Other Ambulatory Visit: Payer: PRIVATE HEALTH INSURANCE

## 2013-02-08 ENCOUNTER — Encounter: Payer: PRIVATE HEALTH INSURANCE | Admitting: Cardiology

## 2013-02-13 ENCOUNTER — Encounter (HOSPITAL_COMMUNITY): Payer: Self-pay | Admitting: Pharmacy Technician

## 2013-02-13 ENCOUNTER — Ambulatory Visit (INDEPENDENT_AMBULATORY_CARE_PROVIDER_SITE_OTHER): Payer: PRIVATE HEALTH INSURANCE | Admitting: Gastroenterology

## 2013-02-13 ENCOUNTER — Encounter: Payer: Self-pay | Admitting: Gastroenterology

## 2013-02-13 VITALS — BP 145/90 | HR 88 | Temp 98.2°F | Ht 62.0 in | Wt 204.2 lb

## 2013-02-13 MED ORDER — PEG 3350-KCL-NA BICARB-NACL 420 G PO SOLR: 4000.0000 mL | ORAL | Status: DC

## 2013-02-13 MED ORDER — PANTOPRAZOLE SODIUM 40 MG PO TBEC: 40.0000 mg | DELAYED_RELEASE_TABLET | Freq: Two times a day (BID) | ORAL | Status: DC

## 2013-02-13 NOTE — Patient Instructions (Addendum)
Increase Protonix to twice a day, 30 minutes before breakfast and 30 minutes before dinner.   Follow the gastroparesis diet. This has been provided for you.   Start taking Miralax daily each evening for constipation. This is over-the-counter.  We have scheduled you for a colonoscopy and upper endoscopy with Dr. Jena Gauss in the near future.

## 2013-02-13 NOTE — Progress Notes (Signed)
Referring Provider: Fredirick Maudlin, MD Primary Care Physician:  Fredirick Maudlin, MD Primary Gastroenterologist: Dr. Jena Gauss   Chief Complaint  Patient presents with  . Follow-up    HPI:   72 year old female presenting today to scheduled f/u EGD to verify gastric ulcer healing. EGD in October 2013 by Dr. Jena Gauss noted non-critical Schatzki's ring, area of partially healing ulceration, negative H.pylori. History of gastroparesis. Thinks ulcer stil there. Vomiting at night. On Protonix once a day. No abdominal pain. Takes Phenergan prn. No melena. No rectal bleeding. Stays constipated. Takes MOM for constipation. Drinks glass of prune juice every morning. Good appetite.   Now tells me dad has hx of colon cancer. Last colonoscopy in 2006 with diverticula.    Past Medical History  Diagnosis Date  . Diabetes mellitus, type II   . Hypertension   . Arteriosclerotic cardiovascular disease (ASCVD)     multivessel, CABG 6/10.  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  Myoview (11/20/11) showed no ischemia & EF 62%  . Gastroesophageal reflux disease     Hiatal hernia  . Hyperlipidemia   . Deep vein thrombosis, upper right extremity     post-PPM  . Obesity   . DDD (degenerative disc disease)   . 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 Dehiscence of closure of sternum or sternotomy 07/2009    sternal infection; required flap closure  . COPD (chronic obstructive pulmonary disease)     severe lung disease by PFTs 6/12  . Sleep apnea   . Seizure disorder     Past Surgical History  Procedure Laterality Date  . Pacemaker placement  1993    Status post DDD pacemaker implantation in 1993, with Medtronic Kappa generator change in 2003 for  second-degree AV block, most recent gen change by Fawn Kirk 04/14/11  . Revision total hip arthroplasty    . Replacement total knee      Left  . Thyroid surgery    . Wrist surgery    . Coronary artery bypass graft  05/1999 and    - LIMA to  LAD, SVG to OM, SVG to PDA  . Reconstructive repair sternal      for infection s/p CABG 8/10  . Back surgery    . Esophagogastroduodenoscopy      with [facility] dilation,  biopsy, and disruption Schatzki's ring  . Colonoscopy  04/27/2005    hemorrhoids, diverticula  . Esophagogastroduodenoscopy  09/10/2012    RMR: Noncritical Schatzki's ring. Diffuse gastric erosions and an  area of partially healing ulceration-status post biopsy/ Small hiatal hernia. Bx reactive gastropathy.  . Abdominal hysterectomy      Current Outpatient Prescriptions  Medication Sig Dispense Refill  . amLODipine (NORVASC) 10 MG tablet Take 10 mg by mouth daily.      Marland Kitchen aspirin EC 81 MG tablet Take 81 mg by mouth daily.      Marland Kitchen atorvastatin (LIPITOR) 40 MG tablet TAKE ONE TABLET BY MOUTH EVERY DAY AT 6PM  30 tablet  5  . Bromfenac Sodium (BROMDAY) 0.09 % SOLN Place 1 drop into the right eye 3 (three) times daily.      Marland Kitchen doxepin (SINEQUAN) 25 MG capsule Take 25 mg by mouth at bedtime.      Marland Kitchen estradiol (ESTRACE) 0.5 MG tablet Take 0.5 mg by mouth daily.        . fentaNYL (DURAGESIC - DOSED MCG/HR) 100 MCG/HR Place 1 patch onto the skin every 3 (three) days.      Marland Kitchen  hydrALAZINE (APRESOLINE) 25 MG tablet Take 25 mg by mouth every 8 (eight) hours.      . magnesium hydroxide (MILK OF MAGNESIA) 400 MG/5ML suspension Takes couple of tablespoons daily.      . metFORMIN (GLUCOPHAGE) 500 MG tablet Take 500 mg by mouth 2 (two) times daily with a meal.       . metoprolol tartrate (LOPRESSOR) 25 MG tablet Take 1 tablet (25 mg total) by mouth 2 (two) times daily.  30 tablet  6  . nitroGLYCERIN (NITROSTAT) 0.4 MG SL tablet Place 0.4 mg under the tongue every 5 (five) minutes as needed.      . ondansetron (ZOFRAN) 8 MG tablet Take 8 mg by mouth every 8 (eight) hours as needed for nausea.       . ondansetron (ZOFRAN-ODT) 8 MG disintegrating tablet Take 8 mg by mouth every 8 (eight) hours as needed. Nausea and Vomiting      . pantoprazole  (PROTONIX) 40 MG tablet Take 1 tablet (40 mg total) by mouth 2 (two) times daily.  60 tablet  11  . phenytoin (DILANTIN) 100 MG ER capsule Take 300 mg by mouth at bedtime.        . potassium chloride (K-DUR) 10 MEQ tablet Take 2 tablets (20 mEq total) by mouth daily.  60 tablet  3  . promethazine (PHENERGAN) 25 MG tablet Take 1 tablet (25 mg total) by mouth every 6 (six) hours as needed for nausea.  15 tablet  0  . senna-docusate (SENOKOT-S) 8.6-50 MG per tablet Take 1 tablet by mouth daily as needed.      . polyethylene glycol-electrolytes (TRILYTE) 420 G solution Take 4,000 mLs by mouth as directed.  4000 mL  0   No current facility-administered medications for this visit.    Allergies as of 02/13/2013 - Review Complete 02/13/2013  Allergen Reaction Noted  . Codeine  05/13/2009  . Other  03/23/2012  . Oxycodone hcl      Family History  Problem Relation Age of Onset  . Diabetes    . Heart disease Mother     MI  . Coronary artery disease      Female <55  . Cancer    . Colon cancer Father     age 54    History   Social History  . Marital Status: Married    Spouse Name: N/A    Number of Children: N/A  . Years of Education: N/A   Social History Main Topics  . Smoking status: Never Smoker   . Smokeless tobacco: Never Used  . Alcohol Use: No  . Drug Use: No  . Sexually Active: Not Currently    Birth Control/ Protection: Post-menopausal, Surgical   Other Topics Concern  . None   Social History Narrative  . None    Review of Systems: Gen: Denies fever, chills, anorexia. Denies fatigue, weakness, weight loss.  CV: +palpitations Resp: +DOE GI: SEE HPI Derm: Denies rash, itching, dry skin Psych: +depression Heme: Denies bruising, bleeding, and enlarged lymph nodes.  Physical Exam: BP 145/90  Pulse 88  Temp(Src) 98.2 F (36.8 C) (Oral)  Ht 5\' 2"  (1.575 m)  Wt 204 lb 3.2 oz (92.625 kg)  BMI 37.34 kg/m2 General:   Alert and oriented. No distress noted. Pleasant  and cooperative.  Head:  Normocephalic and atraumatic. Eyes:  Conjuctiva clear without scleral icterus. Mouth:  edentulous Neck:  Supple, without mass or thyromegaly. Heart:  S1, S2 present without murmurs, rubs, or  gallops. Regular rate and rhythm. Abdomen:  +BS, soft, non-tender and non-distended. No rebound or guarding. No HSM or masses noted. Msk:  Symmetrical without gross deformities. Normal posture. Extremities:  Lower extremity 2 + edema Neurologic:  Alert and  oriented x4;  grossly normal neurologically. Skin:  Intact without significant lesions or rashes. Cervical Nodes:  No significant cervical adenopathy. Psych:  Alert and cooperative. Normal mood and affect.

## 2013-02-14 ENCOUNTER — Ambulatory Visit (INDEPENDENT_AMBULATORY_CARE_PROVIDER_SITE_OTHER): Payer: PRIVATE HEALTH INSURANCE | Admitting: Cardiology

## 2013-02-14 ENCOUNTER — Encounter: Payer: Self-pay | Admitting: Internal Medicine

## 2013-02-14 VITALS — Ht 62.0 in | Wt 202.0 lb

## 2013-02-14 LAB — PACEMAKER DEVICE OBSERVATION
AL IMPEDENCE PM: 599 Ohm
ATRIAL PACING PM: 59.8
BATTERY VOLTAGE: 2.79 V

## 2013-02-14 NOTE — Progress Notes (Signed)
PPM check/device clinic visit. Normal PPM function. See PaceArt report. 

## 2013-02-15 NOTE — Assessment & Plan Note (Signed)
Last colonoscopy in 2006 with diverticula. She now has informed me her father has a history of colon cancer, diagnosed in his 81s. Due to her family history, a colonoscopy will be scheduled at time of follow-up EGD.  Proceed with TCS with Dr. Jena Gauss in near future: the risks, benefits, and alternatives have been discussed with the patient in detail. The patient states understanding and desires to proceed.

## 2013-02-15 NOTE — Assessment & Plan Note (Signed)
Gastroparesis diet provided. Increase Protonix to BID.

## 2013-02-15 NOTE — Assessment & Plan Note (Signed)
72 year old female with history of gastric ulcer in October 2013, due for surveillance. She notes intermittent vomiting in the evenings, but she has a history of gastroparesis. No abdominal pain or melena.   Proceed with follow-up upper endoscopy in the near future with Dr. Jena Gauss. The risks, benefits, and alternatives have been discussed in detail with patient. They have stated understanding and desire to proceed.

## 2013-02-18 NOTE — Progress Notes (Signed)
Faxed to PCP

## 2013-02-20 ENCOUNTER — Encounter (HOSPITAL_COMMUNITY): Payer: Self-pay | Admitting: *Deleted

## 2013-02-20 ENCOUNTER — Ambulatory Visit (HOSPITAL_COMMUNITY)
Admission: RE | Admit: 2013-02-20 | Discharge: 2013-02-20 | Disposition: A | Discharge status: Home / Self Care | Payer: PRIVATE HEALTH INSURANCE | Source: Ambulatory Visit | Attending: Internal Medicine | Admitting: Internal Medicine

## 2013-02-20 ENCOUNTER — Encounter (HOSPITAL_COMMUNITY)
Admission: RE | Disposition: A | Discharge status: Home / Self Care | Payer: Self-pay | Source: Ambulatory Visit | Attending: Internal Medicine

## 2013-02-20 DIAGNOSIS — Z09 Encounter for follow-up examination after completed treatment for conditions other than malignant neoplasm: Secondary | ICD-10-CM

## 2013-02-20 DIAGNOSIS — J449 Chronic obstructive pulmonary disease, unspecified: Secondary | ICD-10-CM | POA: Insufficient documentation

## 2013-02-20 DIAGNOSIS — Z01812 Encounter for preprocedural laboratory examination: Secondary | ICD-10-CM | POA: Insufficient documentation

## 2013-02-20 DIAGNOSIS — Z1211 Encounter for screening for malignant neoplasm of colon: Secondary | ICD-10-CM | POA: Insufficient documentation

## 2013-02-20 DIAGNOSIS — I10 Essential (primary) hypertension: Secondary | ICD-10-CM | POA: Insufficient documentation

## 2013-02-20 DIAGNOSIS — K5730 Diverticulosis of large intestine without perforation or abscess without bleeding: Secondary | ICD-10-CM | POA: Insufficient documentation

## 2013-02-20 DIAGNOSIS — K449 Diaphragmatic hernia without obstruction or gangrene: Secondary | ICD-10-CM

## 2013-02-20 DIAGNOSIS — Z800 Family history of malignant neoplasm of digestive organs: Secondary | ICD-10-CM

## 2013-02-20 DIAGNOSIS — K3184 Gastroparesis: Secondary | ICD-10-CM

## 2013-02-20 DIAGNOSIS — K259 Gastric ulcer, unspecified as acute or chronic, without hemorrhage or perforation: Secondary | ICD-10-CM

## 2013-02-20 DIAGNOSIS — J4489 Other specified chronic obstructive pulmonary disease: Secondary | ICD-10-CM | POA: Insufficient documentation

## 2013-02-20 DIAGNOSIS — K279 Peptic ulcer, site unspecified, unspecified as acute or chronic, without hemorrhage or perforation: Secondary | ICD-10-CM

## 2013-02-20 DIAGNOSIS — E119 Type 2 diabetes mellitus without complications: Secondary | ICD-10-CM | POA: Insufficient documentation

## 2013-02-20 HISTORY — PX: COLONOSCOPY WITH ESOPHAGOGASTRODUODENOSCOPY (EGD): SHX5779

## 2013-02-20 LAB — GLUCOSE, CAPILLARY: Glucose-Capillary: 107 mg/dL — ABNORMAL HIGH (ref 70–99)

## 2013-02-20 SURGERY — COLONOSCOPY WITH ESOPHAGOGASTRODUODENOSCOPY (EGD)
Anesthesia: Moderate Sedation

## 2013-02-20 MED ORDER — MEPERIDINE HCL 100 MG/ML IJ SOLN
INTRAMUSCULAR | Status: AC
  Filled 2013-02-20: qty 2

## 2013-02-20 MED ORDER — ONDANSETRON HCL 4 MG/2ML IJ SOLN
INTRAMUSCULAR | Status: DC | PRN
  Administered 2013-02-20: 4 mg via INTRAVENOUS

## 2013-02-20 MED ORDER — MIDAZOLAM HCL 5 MG/5ML IJ SOLN
INTRAMUSCULAR | Status: AC
  Filled 2013-02-20: qty 10

## 2013-02-20 MED ORDER — SODIUM CHLORIDE 0.9 % IJ SOLN
INTRAMUSCULAR | Status: AC
  Filled 2013-02-20: qty 10

## 2013-02-20 MED ORDER — MIDAZOLAM HCL 5 MG/5ML IJ SOLN
INTRAMUSCULAR | Status: DC | PRN
  Administered 2013-02-20: 1 mg via INTRAVENOUS
  Administered 2013-02-20: 2 mg via INTRAVENOUS
  Administered 2013-02-20 (×2): 1 mg via INTRAVENOUS

## 2013-02-20 MED ORDER — ONDANSETRON HCL 4 MG/2ML IJ SOLN
INTRAMUSCULAR | Status: AC
  Filled 2013-02-20: qty 2

## 2013-02-20 MED ORDER — PROMETHAZINE HCL 25 MG/ML IJ SOLN
INTRAMUSCULAR | Status: AC
  Filled 2013-02-20: qty 1

## 2013-02-20 MED ORDER — MEPERIDINE HCL 100 MG/ML IJ SOLN
INTRAMUSCULAR | Status: DC | PRN
  Administered 2013-02-20: 50 mg via INTRAVENOUS
  Administered 2013-02-20 (×2): 25 mg via INTRAVENOUS

## 2013-02-20 MED ORDER — BUTAMBEN-TETRACAINE-BENZOCAINE 2-2-14 % EX AERO
INHALATION_SPRAY | CUTANEOUS | Status: DC | PRN
  Administered 2013-02-20: 2 via TOPICAL

## 2013-02-20 MED ORDER — PROMETHAZINE HCL 25 MG/ML IJ SOLN
INTRAMUSCULAR | Status: AC
  Administered 2013-02-20: 25 mg
  Filled 2013-02-20: qty 1

## 2013-02-20 MED ORDER — SODIUM CHLORIDE 0.9 % IV SOLN
INTRAVENOUS | Status: DC
  Administered 2013-02-20: 09:00:00 via INTRAVENOUS

## 2013-02-20 MED ORDER — STERILE WATER FOR IRRIGATION IR SOLN
Status: DC | PRN
  Administered 2013-02-20: 10:00:00

## 2013-02-20 NOTE — Op Note (Signed)
[facility] [address] [place], [address]   ENDOSCOPY PROCEDURE REPORT  PATIENT: [name], [name]  MR#: [id number] BIRTHDATE: [birth year] , 71  yrs. old GENDER: Female ENDOSCOPIST: R.  Roetta Sessions, MD FACP [facility] REFERRED BY:  Oval Linsey, M.D. PROCEDURE DATE:  02/20/2013 PROCEDURE:     Diagnostic EGD  INDICATIONS:   Follow up on previously noted gastric ulcer  INFORMED CONSENT:   The risks, benefits, limitations, alternatives and imponderables have been discussed.  The potential for biopsy, esophogeal dilation, etc. have also been reviewed.  Questions have been answered.  All parties agreeable.  Please see the history and physical in the medical record for more information.  MEDICATIONS: Versed 3 mg IV and Demerol 75 mg IV in divided doses. Cetacaine spray. Zofran 4 mg IV and Phenergan 12.5 mg  DESCRIPTION OF PROCEDURE:   The GN-[id number] ([id number])  endoscope was introduced through the mouth and advanced to the second portion of the duodenum without difficulty or limitations.  The mucosal surfaces were surveyed very carefully during advancement of the scope and upon withdrawal.  Retroflexion view of the proximal stomach and esophagogastric junction was performed.      FINDINGS:    Normal appearing tubular esophagus.  Stomach empty. Small hiatal hernia. Stellate scar in the antrum consistent with the previously noted but now, completely healed, gastric ulcer . Patent pylorus. Normal first and second portion of the duodenum  THERAPEUTIC / DIAGNOSTIC MANEUVERS PERFORMED:  None   COMPLICATIONS:  None  IMPRESSION:   Small hiatal hernia. Previously noted gastric ulcer completely healed.  RECOMMENDATIONS:  Continue ongoing treatment for gastroparesis as previously recommended. See colonoscopy report    _______________________________ R. Roetta Sessions, MD FACP [facility] eSigned:  R. Roetta Sessions, MD FACP [facility] 02/20/2013 10:22 AM     CC:

## 2013-02-20 NOTE — Op Note (Signed)
[facility] [address] [place], [address]   COLONOSCOPY PROCEDURE REPORT  PATIENT: Christine, Wolf  MR#:         [id number] BIRTHDATE: 1941-07-27 , 71  yrs. old GENDER: Female ENDOSCOPIST: R.  Roetta Sessions, MD FACP [facility] REFERRED BY:  Kari Baars, M.D. PROCEDURE DATE:  02/20/2013 PROCEDURE:     Screening colonoscopy  INDICATIONS: Average risk colorectal cancer screening (father with colorectal cancer but at an advanced age)  INFORMED CONSENT:  The risks, benefits, alternatives and imponderables including but not limited to bleeding, perforation as well as the possibility of a missed lesion have been reviewed.  The potential for biopsy, lesion removal, etc. have also been discussed. Questions have been answered.  All parties agreeable. Please see the history and physical in the medical record for more information.  MEDICATIONS: Versed 5 mg IV and Demerol 100 mg IV in divided doses. Zofran 4 mg IV. Phenergan 12.5 mg IV  DESCRIPTION OF PROCEDURE:  After a digital rectal exam was performed, the Pentax Colonoscope [id number]  colonoscope was advanced from the anus through the rectum and colon to the area of the cecum, ileocecal valve and appendiceal orifice.  The cecum was deeply intubated.  These structures were well-seen and photographed for the record.  From the level of the cecum and ileocecal valve, the scope was slowly and cautiously withdrawn.  The mucosal surfaces were carefully surveyed utilizing scope tip deflection to facilitate fold flattening as needed.  The scope was pulled down into the rectum where a thorough examination including retroflexion was performed.    FINDINGS:  Adequate preparation. Normal rectum (internal hemorrhoids). Pancolonic diverticulosis; otherwise, the remainder of the colonic mucosa appeared normal.  THERAPEUTIC / DIAGNOSTIC MANEUVERS PERFORMED:  None  COMPLICATIONS: none  CECAL WITHDRAWAL TIME:  11  minutes  IMPRESSION:  Pancolonic diverticulosis  RECOMMENDATIONS: Benefiber 2 tablespoons daily. MiraLax as needed daily for constipation. Repeat screening colonoscopy in 10 years if overall health permits   _______________________________ eSigned:  R. Roetta Sessions, MD FACP [facility] 02/20/2013 11:08 AM   CC:

## 2013-02-20 NOTE — Interval H&P Note (Signed)
History and Physical Interval Note:  02/20/2013 9:51 AM  [name]  has presented today for surgery, with the diagnosis of PUD, FAMILY HX OF COLON CANCER, GASTROPARESIS  The various methods of treatment have been discussed with the patient and family. After consideration of risks, benefits and other options for treatment, the patient has consented to  Procedure(s) with comments: COLONOSCOPY WITH ESOPHAGOGASTRODUODENOSCOPY (EGD) (N/A) - 8:30 as a surgical intervention .  The patient's history has been reviewed, patient examined, no change in status, stable for surgery.  I have reviewed the patient's chart and labs.  Questions were answered to the patient's satisfaction.     [name]  EGD and colonoscopy as outlined above.The risks, benefits, limitations, imponderables and alternatives regarding both EGD and colonoscopy have been reviewed with the patient. Questions have been answered. All parties agreeable.

## 2013-02-20 NOTE — H&P (View-Only) (Signed)
Referring Provider: Fredirick Maudlin, MD Primary Care Physician:  Fredirick Maudlin, MD Primary Gastroenterologist: Dr. Jena Gauss   Chief Complaint  Patient presents with  . Follow-up    HPI:   72 year old female presenting today to scheduled f/u EGD to verify gastric ulcer healing. EGD in October 2013 by Dr. Jena Gauss noted non-critical Schatzki's ring, area of partially healing ulceration, negative H.pylori. History of gastroparesis. Thinks ulcer stil there. Vomiting at night. On Protonix once a day. No abdominal pain. Takes Phenergan prn. No melena. No rectal bleeding. Stays constipated. Takes MOM for constipation. Drinks glass of prune juice every morning. Good appetite.   Now tells me dad has hx of colon cancer. Last colonoscopy in 2006 with diverticula.    Past Medical History  Diagnosis Date  . Diabetes mellitus, type II   . Hypertension   . Arteriosclerotic cardiovascular disease (ASCVD)     multivessel, CABG 6/10.  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  Myoview (11/20/11) showed no ischemia & EF 62%  . Gastroesophageal reflux disease     Hiatal hernia  . Hyperlipidemia   . Deep vein thrombosis, upper right extremity     post-PPM  . Obesity   . DDD (degenerative disc disease)   . 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 Dehiscence of closure of sternum or sternotomy 07/2009    sternal infection; required flap closure  . COPD (chronic obstructive pulmonary disease)     severe lung disease by PFTs 6/12  . Sleep apnea   . Seizure disorder     Past Surgical History  Procedure Laterality Date  . Pacemaker placement  1993    Status post DDD pacemaker implantation in 1993, with Medtronic Kappa generator change in 2003 for  second-degree AV block, most recent gen change by Fawn Kirk 04/14/11  . Revision total hip arthroplasty    . Replacement total knee      Left  . Thyroid surgery    . Wrist surgery    . Coronary artery bypass graft  05/1999 and    - LIMA to  LAD, SVG to OM, SVG to PDA  . Reconstructive repair sternal      for infection s/p CABG 8/10  . Back surgery    . Esophagogastroduodenoscopy      with [facility] dilation,  biopsy, and disruption Schatzki's ring  . Colonoscopy  04/27/2005    hemorrhoids, diverticula  . Esophagogastroduodenoscopy  09/10/2012    RMR: Noncritical Schatzki's ring. Diffuse gastric erosions and an  area of partially healing ulceration-status post biopsy/ Small hiatal hernia. Bx reactive gastropathy.  . Abdominal hysterectomy      Current Outpatient Prescriptions  Medication Sig Dispense Refill  . amLODipine (NORVASC) 10 MG tablet Take 10 mg by mouth daily.      Marland Kitchen aspirin EC 81 MG tablet Take 81 mg by mouth daily.      Marland Kitchen atorvastatin (LIPITOR) 40 MG tablet TAKE ONE TABLET BY MOUTH EVERY DAY AT 6PM  30 tablet  5  . Bromfenac Sodium (BROMDAY) 0.09 % SOLN Place 1 drop into the right eye 3 (three) times daily.      Marland Kitchen doxepin (SINEQUAN) 25 MG capsule Take 25 mg by mouth at bedtime.      Marland Kitchen estradiol (ESTRACE) 0.5 MG tablet Take 0.5 mg by mouth daily.        . fentaNYL (DURAGESIC - DOSED MCG/HR) 100 MCG/HR Place 1 patch onto the skin every 3 (three) days.      Marland Kitchen  hydrALAZINE (APRESOLINE) 25 MG tablet Take 25 mg by mouth every 8 (eight) hours.      . magnesium hydroxide (MILK OF MAGNESIA) 400 MG/5ML suspension Takes couple of tablespoons daily.      . metFORMIN (GLUCOPHAGE) 500 MG tablet Take 500 mg by mouth 2 (two) times daily with a meal.       . metoprolol tartrate (LOPRESSOR) 25 MG tablet Take 1 tablet (25 mg total) by mouth 2 (two) times daily.  30 tablet  6  . nitroGLYCERIN (NITROSTAT) 0.4 MG SL tablet Place 0.4 mg under the tongue every 5 (five) minutes as needed.      . ondansetron (ZOFRAN) 8 MG tablet Take 8 mg by mouth every 8 (eight) hours as needed for nausea.       . ondansetron (ZOFRAN-ODT) 8 MG disintegrating tablet Take 8 mg by mouth every 8 (eight) hours as needed. Nausea and Vomiting      . pantoprazole  (PROTONIX) 40 MG tablet Take 1 tablet (40 mg total) by mouth 2 (two) times daily.  60 tablet  11  . phenytoin (DILANTIN) 100 MG ER capsule Take 300 mg by mouth at bedtime.        . potassium chloride (K-DUR) 10 MEQ tablet Take 2 tablets (20 mEq total) by mouth daily.  60 tablet  3  . promethazine (PHENERGAN) 25 MG tablet Take 1 tablet (25 mg total) by mouth every 6 (six) hours as needed for nausea.  15 tablet  0  . senna-docusate (SENOKOT-S) 8.6-50 MG per tablet Take 1 tablet by mouth daily as needed.      . polyethylene glycol-electrolytes (TRILYTE) 420 G solution Take 4,000 mLs by mouth as directed.  4000 mL  0   No current facility-administered medications for this visit.    Allergies as of 02/13/2013 - Review Complete 02/13/2013  Allergen Reaction Noted  . Codeine  05/13/2009  . Other  03/23/2012  . Oxycodone hcl      Family History  Problem Relation Age of Onset  . Diabetes    . Heart disease Mother     MI  . Coronary artery disease      Female <55  . Cancer    . Colon cancer Father     age 46    History   Social History  . Marital Status: Married    Spouse Name: N/A    Number of Children: N/A  . Years of Education: N/A   Social History Main Topics  . Smoking status: Never Smoker   . Smokeless tobacco: Never Used  . Alcohol Use: No  . Drug Use: No  . Sexually Active: Not Currently    Birth Control/ Protection: Post-menopausal, Surgical   Other Topics Concern  . None   Social History Narrative  . None    Review of Systems: Gen: Denies fever, chills, anorexia. Denies fatigue, weakness, weight loss.  CV: +palpitations Resp: +DOE GI: SEE HPI Derm: Denies rash, itching, dry skin Psych: +depression Heme: Denies bruising, bleeding, and enlarged lymph nodes.  Physical Exam: BP 145/90  Pulse 88  Temp(Src) 98.2 F (36.8 C) (Oral)  Ht 5\' 2"  (1.575 m)  Wt 204 lb 3.2 oz (92.625 kg)  BMI 37.34 kg/m2 General:   Alert and oriented. No distress noted. Pleasant  and cooperative.  Head:  Normocephalic and atraumatic. Eyes:  Conjuctiva clear without scleral icterus. Mouth:  edentulous Neck:  Supple, without mass or thyromegaly. Heart:  S1, S2 present without murmurs, rubs, or  gallops. Regular rate and rhythm. Abdomen:  +BS, soft, non-tender and non-distended. No rebound or guarding. No HSM or masses noted. Msk:  Symmetrical without gross deformities. Normal posture. Extremities:  Lower extremity 2 + edema Neurologic:  Alert and  oriented x4;  grossly normal neurologically. Skin:  Intact without significant lesions or rashes. Cervical Nodes:  No significant cervical adenopathy. Psych:  Alert and cooperative. Normal mood and affect.

## 2013-02-22 ENCOUNTER — Encounter (HOSPITAL_COMMUNITY): Payer: Self-pay | Admitting: Internal Medicine

## 2013-03-03 ENCOUNTER — Inpatient Hospital Stay (HOSPITAL_COMMUNITY)
Admission: EM | Admit: 2013-03-03 | Discharge: 2013-03-06 | DRG: 641 | Disposition: A | Discharge status: Home Health | Payer: PRIVATE HEALTH INSURANCE | Attending: Pulmonary Disease | Admitting: Pulmonary Disease

## 2013-03-03 ENCOUNTER — Encounter (HOSPITAL_COMMUNITY): Payer: Self-pay | Admitting: Emergency Medicine

## 2013-03-03 ENCOUNTER — Emergency Department (HOSPITAL_COMMUNITY): Payer: PRIVATE HEALTH INSURANCE

## 2013-03-03 DIAGNOSIS — Z800 Family history of malignant neoplasm of digestive organs: Secondary | ICD-10-CM

## 2013-03-03 DIAGNOSIS — K219 Gastro-esophageal reflux disease without esophagitis: Secondary | ICD-10-CM | POA: Diagnosis present

## 2013-03-03 DIAGNOSIS — E119 Type 2 diabetes mellitus without complications: Secondary | ICD-10-CM | POA: Diagnosis present

## 2013-03-03 DIAGNOSIS — R112 Nausea with vomiting, unspecified: Secondary | ICD-10-CM

## 2013-03-03 DIAGNOSIS — E860 Dehydration: Principal | ICD-10-CM | POA: Diagnosis present

## 2013-03-03 DIAGNOSIS — I10 Essential (primary) hypertension: Secondary | ICD-10-CM | POA: Diagnosis present

## 2013-03-03 DIAGNOSIS — I2510 Atherosclerotic heart disease of native coronary artery without angina pectoris: Secondary | ICD-10-CM | POA: Diagnosis present

## 2013-03-03 DIAGNOSIS — Z79899 Other long term (current) drug therapy: Secondary | ICD-10-CM

## 2013-03-03 DIAGNOSIS — Z8249 Family history of ischemic heart disease and other diseases of the circulatory system: Secondary | ICD-10-CM

## 2013-03-03 DIAGNOSIS — Z6835 Body mass index (BMI) 35.0-35.9, adult: Secondary | ICD-10-CM

## 2013-03-03 DIAGNOSIS — J449 Chronic obstructive pulmonary disease, unspecified: Secondary | ICD-10-CM | POA: Diagnosis present

## 2013-03-03 DIAGNOSIS — K529 Noninfective gastroenteritis and colitis, unspecified: Secondary | ICD-10-CM | POA: Diagnosis present

## 2013-03-03 DIAGNOSIS — G4730 Sleep apnea, unspecified: Secondary | ICD-10-CM | POA: Diagnosis present

## 2013-03-03 DIAGNOSIS — K5900 Constipation, unspecified: Secondary | ICD-10-CM | POA: Diagnosis present

## 2013-03-03 DIAGNOSIS — I5032 Chronic diastolic (congestive) heart failure: Secondary | ICD-10-CM | POA: Diagnosis present

## 2013-03-03 DIAGNOSIS — K279 Peptic ulcer, site unspecified, unspecified as acute or chronic, without hemorrhage or perforation: Secondary | ICD-10-CM | POA: Diagnosis present

## 2013-03-03 DIAGNOSIS — Z833 Family history of diabetes mellitus: Secondary | ICD-10-CM

## 2013-03-03 DIAGNOSIS — I509 Heart failure, unspecified: Secondary | ICD-10-CM | POA: Diagnosis present

## 2013-03-03 DIAGNOSIS — J4489 Other specified chronic obstructive pulmonary disease: Secondary | ICD-10-CM | POA: Diagnosis present

## 2013-03-03 DIAGNOSIS — Z7982 Long term (current) use of aspirin: Secondary | ICD-10-CM

## 2013-03-03 DIAGNOSIS — K3184 Gastroparesis: Secondary | ICD-10-CM | POA: Diagnosis present

## 2013-03-03 DIAGNOSIS — G40909 Epilepsy, unspecified, not intractable, without status epilepticus: Secondary | ICD-10-CM | POA: Diagnosis present

## 2013-03-03 DIAGNOSIS — Z86718 Personal history of other venous thrombosis and embolism: Secondary | ICD-10-CM

## 2013-03-03 DIAGNOSIS — Z950 Presence of cardiac pacemaker: Secondary | ICD-10-CM

## 2013-03-03 DIAGNOSIS — K5289 Other specified noninfective gastroenteritis and colitis: Secondary | ICD-10-CM | POA: Diagnosis present

## 2013-03-03 DIAGNOSIS — Z96659 Presence of unspecified artificial knee joint: Secondary | ICD-10-CM

## 2013-03-03 DIAGNOSIS — Z951 Presence of aortocoronary bypass graft: Secondary | ICD-10-CM

## 2013-03-03 DIAGNOSIS — IMO0002 Reserved for concepts with insufficient information to code with codable children: Secondary | ICD-10-CM | POA: Diagnosis present

## 2013-03-03 DIAGNOSIS — E669 Obesity, unspecified: Secondary | ICD-10-CM | POA: Diagnosis present

## 2013-03-03 DIAGNOSIS — R197 Diarrhea, unspecified: Secondary | ICD-10-CM

## 2013-03-03 DIAGNOSIS — E785 Hyperlipidemia, unspecified: Secondary | ICD-10-CM | POA: Diagnosis present

## 2013-03-03 DIAGNOSIS — E876 Hypokalemia: Secondary | ICD-10-CM | POA: Diagnosis present

## 2013-03-03 LAB — CBC WITH DIFFERENTIAL/PLATELET
Eosinophils Relative: 0 % (ref 0–5)
HCT: 33.1 % — ABNORMAL LOW (ref 36.0–46.0)
Hemoglobin: 11.1 g/dL — ABNORMAL LOW (ref 12.0–15.0)
Lymphocytes Relative: 10 % — ABNORMAL LOW (ref 12–46)
Lymphs Abs: 1.6 10*3/uL (ref 0.7–4.0)
MCV: 83.2 fL (ref 78.0–100.0)
Monocytes Absolute: 1.4 10*3/uL — ABNORMAL HIGH (ref 0.1–1.0)
Monocytes Relative: 9 % (ref 3–12)
RBC: 3.98 MIL/uL (ref 3.87–5.11)
WBC: 15.5 10*3/uL — ABNORMAL HIGH (ref 4.0–10.5)

## 2013-03-03 LAB — MAGNESIUM: Magnesium: 1.3 mg/dL — ABNORMAL LOW (ref 1.5–2.5)

## 2013-03-03 LAB — BASIC METABOLIC PANEL
BUN: 17 mg/dL (ref 6–23)
CO2: 27 mEq/L (ref 19–32)
Calcium: 10.2 mg/dL (ref 8.4–10.5)
Glucose, Bld: 133 mg/dL — ABNORMAL HIGH (ref 70–99)
Sodium: 135 mEq/L (ref 135–145)

## 2013-03-03 LAB — HEPATIC FUNCTION PANEL
ALT: 14 U/L (ref 0–35)
AST: 28 U/L (ref 0–37)
Bilirubin, Direct: 0.3 mg/dL (ref 0.0–0.3)
Total Protein: 8.4 g/dL — ABNORMAL HIGH (ref 6.0–8.3)

## 2013-03-03 MED ORDER — ESTRADIOL 1 MG PO TABS
0.5000 mg | ORAL_TABLET | Freq: Every day | ORAL | Status: DC
  Administered 2013-03-03 – 2013-03-06 (×4): 0.5 mg via ORAL
  Filled 2013-03-03 (×6): qty 0.5

## 2013-03-03 MED ORDER — ONDANSETRON HCL 4 MG PO TABS: 8.0000 mg | ORAL_TABLET | Freq: Three times a day (TID) | ORAL | Status: DC | PRN

## 2013-03-03 MED ORDER — ONDANSETRON 8 MG PO TBDP
8.0000 mg | ORAL_TABLET | Freq: Once | ORAL | Status: AC
  Administered 2013-03-03: 8 mg via ORAL
  Filled 2013-03-03: qty 1

## 2013-03-03 MED ORDER — PHENYTOIN SODIUM EXTENDED 100 MG PO CAPS
300.0000 mg | ORAL_CAPSULE | Freq: Every day | ORAL | Status: DC
  Administered 2013-03-03 – 2013-03-05 (×3): 300 mg via ORAL
  Filled 2013-03-03 (×3): qty 3

## 2013-03-03 MED ORDER — METOPROLOL TARTRATE 25 MG PO TABS
25.0000 mg | ORAL_TABLET | Freq: Two times a day (BID) | ORAL | Status: DC
  Administered 2013-03-03 – 2013-03-06 (×4): 25 mg via ORAL
  Filled 2013-03-03 (×4): qty 1

## 2013-03-03 MED ORDER — SODIUM CHLORIDE 0.9 % IV BOLUS (SEPSIS)
500.0000 mL | Freq: Once | INTRAVENOUS | Status: AC
  Administered 2013-03-03: 500 mL via INTRAVENOUS

## 2013-03-03 MED ORDER — ATORVASTATIN CALCIUM 20 MG PO TABS
20.0000 mg | ORAL_TABLET | Freq: Every day | ORAL | Status: DC
  Administered 2013-03-03 – 2013-03-05 (×3): 20 mg via ORAL
  Filled 2013-03-03 (×3): qty 1

## 2013-03-03 MED ORDER — SODIUM CHLORIDE 0.9 % IJ SOLN
10.0000 mL | Freq: Two times a day (BID) | INTRAMUSCULAR | Status: DC
  Administered 2013-03-03 – 2013-03-06 (×5): 10 mL

## 2013-03-03 MED ORDER — MAGNESIUM HYDROXIDE 400 MG/5ML PO SUSP: 5.0000 mL | Freq: Every day | ORAL | Status: DC | PRN

## 2013-03-03 MED ORDER — SODIUM CHLORIDE 0.9 % IV SOLN
INTRAVENOUS | Status: DC
  Administered 2013-03-03 – 2013-03-05 (×4): via INTRAVENOUS

## 2013-03-03 MED ORDER — BROMFENAC SODIUM (ONCE-DAILY) 0.09 % OP SOLN
1.0000 [drp] | Freq: Three times a day (TID) | OPHTHALMIC | Status: DC
  Administered 2013-03-04 – 2013-03-06 (×7): 1 [drp] via OPHTHALMIC
  Filled 2013-03-03 (×7): qty 0.1

## 2013-03-03 MED ORDER — PANTOPRAZOLE SODIUM 40 MG PO TBEC
40.0000 mg | DELAYED_RELEASE_TABLET | Freq: Two times a day (BID) | ORAL | Status: DC
  Administered 2013-03-03 – 2013-03-04 (×2): 40 mg via ORAL
  Filled 2013-03-03 (×2): qty 1

## 2013-03-03 MED ORDER — FENTANYL 100 MCG/HR TD PT72
100.0000 ug | MEDICATED_PATCH | TRANSDERMAL | Status: DC
  Administered 2013-03-03: 100 ug via TRANSDERMAL
  Filled 2013-03-03: qty 1

## 2013-03-03 MED ORDER — DOXEPIN HCL 25 MG PO CAPS
ORAL_CAPSULE | ORAL | Status: AC
  Filled 2013-03-03: qty 1

## 2013-03-03 MED ORDER — POTASSIUM CHLORIDE 10 MEQ/100ML IV SOLN
10.0000 meq | INTRAVENOUS | Status: AC
  Administered 2013-03-03 (×3): 10 meq via INTRAVENOUS
  Filled 2013-03-03: qty 300
  Filled 2013-03-03: qty 100
  Filled 2013-03-03: qty 200

## 2013-03-03 MED ORDER — MORPHINE SULFATE 4 MG/ML IJ SOLN
4.0000 mg | Freq: Once | INTRAMUSCULAR | Status: AC
  Administered 2013-03-03: 4 mg via INTRAMUSCULAR
  Filled 2013-03-03: qty 1

## 2013-03-03 MED ORDER — NITROGLYCERIN 0.4 MG SL SUBL: 0.4000 mg | SUBLINGUAL_TABLET | SUBLINGUAL | Status: DC | PRN

## 2013-03-03 MED ORDER — POTASSIUM CHLORIDE CRYS ER 10 MEQ PO TBCR
20.0000 meq | EXTENDED_RELEASE_TABLET | Freq: Every day | ORAL | Status: DC
  Administered 2013-03-03 – 2013-03-06 (×4): 20 meq via ORAL
  Filled 2013-03-03 (×6): qty 2

## 2013-03-03 MED ORDER — ONDANSETRON 8 MG PO TBDP: 8.0000 mg | ORAL_TABLET | Freq: Once | ORAL | Status: AC

## 2013-03-03 MED ORDER — ENOXAPARIN SODIUM 40 MG/0.4ML ~~LOC~~ SOLN
40.0000 mg | SUBCUTANEOUS | Status: DC
  Administered 2013-03-03 – 2013-03-05 (×3): 40 mg via SUBCUTANEOUS
  Filled 2013-03-03 (×3): qty 0.4

## 2013-03-03 MED ORDER — ONDANSETRON 8 MG PO TBDP
ORAL_TABLET | ORAL | Status: AC
  Administered 2013-03-03: 8 mg via ORAL
  Filled 2013-03-03: qty 1

## 2013-03-03 MED ORDER — METFORMIN HCL 500 MG PO TABS
500.0000 mg | ORAL_TABLET | Freq: Two times a day (BID) | ORAL | Status: DC
  Administered 2013-03-05: 500 mg via ORAL
  Filled 2013-03-03 (×2): qty 1

## 2013-03-03 MED ORDER — ESTRADIOL 0.5 MG PO TABS: 0.5000 mg | ORAL_TABLET | Freq: Every day | ORAL | Status: DC

## 2013-03-03 MED ORDER — ONDANSETRON HCL 4 MG/2ML IJ SOLN
4.0000 mg | Freq: Three times a day (TID) | INTRAMUSCULAR | Status: AC | PRN
  Administered 2013-03-04: 4 mg via INTRAVENOUS
  Filled 2013-03-03: qty 2

## 2013-03-03 MED ORDER — HYDRALAZINE HCL 25 MG PO TABS
25.0000 mg | ORAL_TABLET | Freq: Three times a day (TID) | ORAL | Status: DC
  Administered 2013-03-03 – 2013-03-06 (×7): 25 mg via ORAL
  Filled 2013-03-03 (×7): qty 1

## 2013-03-03 MED ORDER — ESTRADIOL 1 MG PO TABS
ORAL_TABLET | ORAL | Status: AC
  Filled 2013-03-03: qty 1

## 2013-03-03 MED ORDER — SODIUM CHLORIDE 0.9 % IV SOLN
Freq: Once | INTRAVENOUS | Status: AC
  Administered 2013-03-03: 16:00:00 via INTRAVENOUS

## 2013-03-03 MED ORDER — AMLODIPINE BESYLATE 5 MG PO TABS
10.0000 mg | ORAL_TABLET | Freq: Every day | ORAL | Status: DC
  Administered 2013-03-03 – 2013-03-06 (×3): 10 mg via ORAL
  Filled 2013-03-03: qty 1
  Filled 2013-03-03 (×2): qty 2

## 2013-03-03 MED ORDER — PROMETHAZINE HCL 12.5 MG PO TABS
25.0000 mg | ORAL_TABLET | Freq: Four times a day (QID) | ORAL | Status: DC | PRN
  Administered 2013-03-04: 25 mg via ORAL
  Filled 2013-03-03: qty 2

## 2013-03-03 MED ORDER — DOXEPIN HCL 25 MG PO CAPS
25.0000 mg | ORAL_CAPSULE | Freq: Every day | ORAL | Status: DC
  Administered 2013-03-03 – 2013-03-05 (×3): 25 mg via ORAL
  Filled 2013-03-03 (×2): qty 1

## 2013-03-03 MED ORDER — SODIUM CHLORIDE 0.9 % IJ SOLN: 10.0000 mL | INTRAMUSCULAR | Status: DC | PRN

## 2013-03-03 MED ORDER — ASPIRIN EC 81 MG PO TBEC
81.0000 mg | DELAYED_RELEASE_TABLET | Freq: Every day | ORAL | Status: DC
  Administered 2013-03-03 – 2013-03-06 (×4): 81 mg via ORAL
  Filled 2013-03-03 (×4): qty 1

## 2013-03-03 MED ORDER — PHENYTOIN SODIUM EXTENDED 100 MG PO CAPS: 300.0000 mg | ORAL_CAPSULE | Freq: Every day | ORAL | Status: DC

## 2013-03-03 NOTE — ED Notes (Signed)
N/v x 3. Vomited x 2 today. Denies pain. Mm moist. Alert/oriented to most. Walks with cane.

## 2013-03-03 NOTE — ED Provider Notes (Signed)
History  This chart was scribed for Christine Hutching, MD, by Candelaria Stagers, ED Scribe. This patient was seen in room [facility]03[facility] and the patient's care was started at 11:44 AM   CSN: [id number]  Arrival date & time 03/03/13  1125   First MD Initiated Contact with Patient 03/03/13 1139      Chief Complaint  Patient presents with  . Emesis    (Consider location/radiation/quality/duration/timing/severity/associated sxs/prior treatment) HPI Kamber AGUSTINA WITZKE is a 72 y.o. female who presents to the Emergency Department complaining of nausea, vomiting, and diarrhea for 24 hours.  Has been unable to keep anything down.   Several episodes of each. No blood or mucus in stool. Decreased urinary output. Nothing makes symptoms better or worse. Severity is moderate.   .  Past Medical History  Diagnosis Date  . Diabetes mellitus, type II   . Hypertension   . Arteriosclerotic cardiovascular disease (ASCVD)     multivessel, CABG 6/10.  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  Myoview (11/20/11) showed no ischemia & EF 62%  . Gastroesophageal reflux disease     Hiatal hernia  . Hyperlipidemia   . Deep vein thrombosis, upper right extremity     post-PPM  . Obesity   . DDD (degenerative disc disease)   . Dehiscence of closure of sternum or sternotomy 07/2009    sternal infection; required flap closure  . COPD (chronic obstructive pulmonary disease)     severe lung disease by PFTs 6/12  . Sleep apnea   . Seizure disorder   . 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 Pacemaker placement  1993    Status post DDD pacemaker implantation in 1993, with Medtronic Kappa generator change in 2003 for  second-degree AV block, most recent gen change by Fawn Kirk 04/14/11  . Revision total hip arthroplasty    . Replacement total knee      Left  . Thyroid surgery    . Wrist surgery    . Coronary artery bypass graft  05/1999 and    - LIMA  to LAD, SVG to OM, SVG to PDA  . Reconstructive repair sternal      for infection s/p CABG 8/10  . Back surgery    . Esophagogastroduodenoscopy      with [facility] dilation,  biopsy, and disruption Schatzki's ring  . Colonoscopy  04/27/2005    hemorrhoids, diverticula  . Esophagogastroduodenoscopy  09/10/2012    RMR: Noncritical Schatzki's ring. Diffuse gastric erosions and an  area of partially healing ulceration-status post biopsy/ Small hiatal hernia. Bx reactive gastropathy.  . Abdominal hysterectomy    . Colonoscopy with esophagogastroduodenoscopy (egd) N/A 02/20/2013    Procedure: COLONOSCOPY WITH ESOPHAGOGASTRODUODENOSCOPY (EGD);  Surgeon: Corbin Ade, MD;  Location: [facility];  Service: Endoscopy;  Laterality: N/A;  8:30    Family History  Problem Relation Age of Onset  . Diabetes    . Heart disease Mother     MI  . Coronary artery disease      Female <55  . Cancer    . Colon cancer Father     age 2    History  Substance Use Topics  . Smoking status: Never Smoker   . Smokeless tobacco: Never Used  . Alcohol Use: No    OB History   Grav Para Term Preterm Abortions TAB SAB Ect Mult Living  Review of Systems  All other systems reviewed and are negative.    Allergies  Codeine; Other; and Oxycodone hcl  Home Medications   Current Outpatient Rx  Name  Route  Sig  Dispense  Refill  . amLODipine (NORVASC) 10 MG tablet   Oral   Take 10 mg by mouth daily.         Marland Kitchen aspirin EC 81 MG tablet   Oral   Take 81 mg by mouth daily.         Marland Kitchen atorvastatin (LIPITOR) 40 MG tablet      TAKE ONE TABLET BY MOUTH EVERY DAY AT 6PM   30 tablet   5   . Bromfenac Sodium (BROMDAY) 0.09 % SOLN   Right Eye   Place 1 drop into the right eye 3 (three) times daily.         Marland Kitchen doxepin (SINEQUAN) 25 MG capsule   Oral   Take 25 mg by mouth at bedtime.         Marland Kitchen estradiol (ESTRACE) 0.5 MG tablet   Oral   Take 0.5 mg by mouth daily.           .  fentaNYL (DURAGESIC - DOSED MCG/HR) 100 MCG/HR   Transdermal   Place 1 patch onto the skin every 3 (three) days.         . hydrALAZINE (APRESOLINE) 25 MG tablet   Oral   Take 25 mg by mouth every 8 (eight) hours.         . magnesium hydroxide (MILK OF MAGNESIA) 400 MG/5ML suspension      Takes couple of tablespoons daily.         . metFORMIN (GLUCOPHAGE) 500 MG tablet   Oral   Take 500 mg by mouth 2 (two) times daily with a meal.          . metoprolol tartrate (LOPRESSOR) 25 MG tablet   Oral   Take 1 tablet (25 mg total) by mouth 2 (two) times daily.   30 tablet   6   . nitroGLYCERIN (NITROSTAT) 0.4 MG SL tablet   Sublingual   Place 0.4 mg under the tongue every 5 (five) minutes as needed.         . ondansetron (ZOFRAN) 8 MG tablet   Oral   Take 8 mg by mouth every 8 (eight) hours as needed for nausea.          . ondansetron (ZOFRAN-ODT) 8 MG disintegrating tablet   Oral   Take 8 mg by mouth every 8 (eight) hours as needed. Nausea and Vomiting         . pantoprazole (PROTONIX) 40 MG tablet   Oral   Take 1 tablet (40 mg total) by mouth 2 (two) times daily.   60 tablet   11   . phenytoin (DILANTIN) 100 MG ER capsule   Oral   Take 300 mg by mouth at bedtime.           . polyethylene glycol-electrolytes (TRILYTE) 420 G solution   Oral   Take 4,000 mLs by mouth as directed.   4000 mL   0   . potassium chloride (K-DUR) 10 MEQ tablet   Oral   Take 2 tablets (20 mEq total) by mouth daily.   60 tablet   3   . promethazine (PHENERGAN) 25 MG tablet   Oral   Take 1 tablet (25 mg total) by mouth every 6 (six) hours as needed for  nausea.   15 tablet   0   . senna-docusate (SENOKOT-S) 8.6-50 MG per tablet   Oral   Take 1 tablet by mouth daily as needed for constipation.            BP 164/105  Pulse 91  Temp(Src) 99.2 F (37.3 C) (Oral)  Resp 20  SpO2 99%  Physical Exam  Nursing note and vitals reviewed. Constitutional: She is oriented to  person, place, and time. She appears well-developed and well-nourished.  HENT:  Head: Normocephalic and atraumatic.  Eyes: Conjunctivae and EOM are normal. Pupils are equal, round, and reactive to light.  Neck: Normal range of motion. Neck supple.  Cardiovascular: Normal rate, regular rhythm and normal heart sounds.   Pulmonary/Chest: Effort normal and breath sounds normal.  Minimal epigastric tenderness  Abdominal: Soft. Bowel sounds are normal.  Musculoskeletal: Normal range of motion.  Neurological: She is alert and oriented to person, place, and time.  Skin: Skin is warm and dry.  Psychiatric: She has a normal mood and affect.    ED Course  Procedures (including critical care time)  Labs Reviewed  GLUCOSE, CAPILLARY - Abnormal; Notable for the following:    Glucose-Capillary 152 (*)    All other components within normal limits  CBC WITH DIFFERENTIAL  BASIC METABOLIC PANEL   No results found.   No diagnosis found.    MDM  Central line place for IV hydration.  Admit to gen med        Christine Hutching, MD 03/03/13 1539

## 2013-03-03 NOTE — ED Notes (Signed)
Pt unable to void at this time. 

## 2013-03-03 NOTE — H&P (Signed)
[id number] 

## 2013-03-03 NOTE — ED Notes (Signed)
IV attempt x 2 without success.  Pt reports being a hard stick.  Lab at bedside, unable to obtain labs from venous stick.  RT notified for arterial stick for labs.  edp notified.

## 2013-03-03 NOTE — ED Notes (Signed)
Pt sitting up in bed, sipping on diet ginger ale per edp request.  Tolerating well at this time.   nad noted.

## 2013-03-03 NOTE — ED Notes (Signed)
Pt heaving after sipping fluids.  edp notified at this time.

## 2013-03-03 NOTE — Procedures (Signed)
Central Venous Catheter Insertion Procedure Note [name] [id number] [birth year]  Procedure: Insertion of Central Venous Catheter Indications: Drug and/or fluid administration and Frequent blood sampling  Procedure Details Consent: Risks of procedure as well as the alternatives and risks of each were explained to the (patient/caregiver).  Consent for procedure obtained. Time Out: Verified patient identification, verified procedure, site/side was marked, verified correct patient position, special equipment/implants available, medications/allergies/relevent history reviewed, required imaging and test results available.  Performed  Maximum sterile technique was used including antiseptics, gloves, gown, hand hygiene, mask and sheet. Skin prep: Iodine solution; local anesthetic administered A antimicrobial bonded/coated triple lumen catheter was placed in the right femoral vein due to multiple attempts, no other available access using the Seldinger technique.  Evaluation Blood flow good Complications: No apparent complications Patient did tolerate procedure well.    Giuseppe Duchemin A 03/03/2013, 2:39 PM

## 2013-03-04 ENCOUNTER — Ambulatory Visit (HOSPITAL_COMMUNITY): Payer: PRIVATE HEALTH INSURANCE | Admitting: Physical Therapy

## 2013-03-04 ENCOUNTER — Encounter (HOSPITAL_COMMUNITY): Payer: Self-pay | Admitting: Gastroenterology

## 2013-03-04 DIAGNOSIS — R112 Nausea with vomiting, unspecified: Secondary | ICD-10-CM

## 2013-03-04 DIAGNOSIS — R197 Diarrhea, unspecified: Secondary | ICD-10-CM

## 2013-03-04 LAB — URINALYSIS, ROUTINE W REFLEX MICROSCOPIC
Glucose, UA: NEGATIVE mg/dL
Ketones, ur: NEGATIVE mg/dL
Leukocytes, UA: NEGATIVE
pH: 6 (ref 5.0–8.0)

## 2013-03-04 LAB — BASIC METABOLIC PANEL
BUN: 16 mg/dL (ref 6–23)
CO2: 28 mEq/L (ref 19–32)
Calcium: 9.8 mg/dL (ref 8.4–10.5)
Creatinine, Ser: 1.27 mg/dL — ABNORMAL HIGH (ref 0.50–1.10)

## 2013-03-04 LAB — GLUCOSE, CAPILLARY
Glucose-Capillary: 124 mg/dL — ABNORMAL HIGH (ref 70–99)
Glucose-Capillary: 113 mg/dL — ABNORMAL HIGH (ref 70–99)

## 2013-03-04 LAB — HEPATIC FUNCTION PANEL
ALT: 16 U/L (ref 0–35)
AST: 32 U/L (ref 0–37)
Albumin: 4.1 g/dL (ref 3.5–5.2)

## 2013-03-04 LAB — URINE MICROSCOPIC-ADD ON

## 2013-03-04 LAB — TSH: TSH: 1.086 u[IU]/mL (ref 0.350–4.500)

## 2013-03-04 MED ORDER — RISAQUAD PO CAPS
1.0000 | ORAL_CAPSULE | Freq: Every day | ORAL | Status: DC
  Administered 2013-03-04 – 2013-03-06 (×3): 1 via ORAL
  Filled 2013-03-04 (×3): qty 1

## 2013-03-04 MED ORDER — PANTOPRAZOLE SODIUM 40 MG PO TBEC
40.0000 mg | DELAYED_RELEASE_TABLET | Freq: Two times a day (BID) | ORAL | Status: DC
  Administered 2013-03-04 – 2013-03-06 (×4): 40 mg via ORAL
  Filled 2013-03-04 (×4): qty 1

## 2013-03-04 MED ORDER — BOOST / RESOURCE BREEZE PO LIQD
1.0000 | Freq: Three times a day (TID) | ORAL | Status: DC
  Administered 2013-03-04 – 2013-03-06 (×6): 1 via ORAL

## 2013-03-04 NOTE — Progress Notes (Signed)
Subjective: She was admitted with nausea vomiting and diarrhea. This is been going on for about 36 hours prior to admission. She was dehydrated and hypokalemic on admission.  Objective: Vital signs in last 24 hours: Temp:  [98.6 F (37 C)-99.3 F (37.4 C)] 99.3 F (37.4 C) (03/31 0541) Pulse Rate:  [60-91] 60 (03/31 0541) Resp:  [16-24] 20 (03/31 0541) BP: (148-188)/(60-105) 188/77 mmHg (03/31 0541) SpO2:  [92 %-100 %] 93 % (03/31 0541) Weight:  [88.1 kg (194 lb 3.6 oz)] 88.1 kg (194 lb 3.6 oz) (03/30 1816) Weight change:  Last BM Date: 03/03/13  Intake/Output from previous day: 03/30 0701 - 03/31 0700 In: 1340 [P.O.:240; I.V.:800; IV Piggyback:300] Out: 700 [Urine:700]  PHYSICAL EXAM General appearance: alert, cooperative, moderate distress and She looks acutely and chronically sick Resp: clear to auscultation bilaterally Cardio: regular rate and rhythm, S1, S2 normal, no murmur, click, rub or gallop GI: She has mild diffuse tenderness and hyperactive bowel sounds Extremities: extremities normal, atraumatic, no cyanosis or edema  Lab Results:    Basic Metabolic Panel:  Recent Labs  40/98/11 1440 03/04/13 0503  NA 135 139  K 2.9* 3.2*  CL 90* 99  CO2 27 28  GLUCOSE 133* 128*  BUN 17 16  CREATININE 1.35* 1.27*  CALCIUM 10.2 9.8  MG 1.3*  --    Liver Function Tests:  Recent Labs  03/03/13 1440 03/04/13 0503  AST 28 32  ALT 14 16  ALKPHOS 105 100  BILITOT 0.6 0.6  PROT 8.4* 8.0  ALBUMIN 4.3 4.1    Recent Labs  03/03/13 1440 03/04/13 0503  LIPASE 31 30   No results found for this basename: AMMONIA,  in the last 72 hours CBC:  Recent Labs  03/03/13 1440  WBC 15.5*  NEUTROABS 12.5*  HGB 11.1*  HCT 33.1*  MCV 83.2  PLT 235   Cardiac Enzymes: No results found for this basename: CKTOTAL, CKMB, CKMBINDEX, TROPONINI,  in the last 72 hours BNP: No results found for this basename: PROBNP,  in the last 72 hours D-Dimer: No results found for  this basename: DDIMER,  in the last 72 hours CBG:  Recent Labs  03/03/13 1140 03/03/13 2136 03/04/13 0759  GLUCAP 152* 124* 113*   Hemoglobin A1C: No results found for this basename: HGBA1C,  in the last 72 hours Fasting Lipid Panel: No results found for this basename: CHOL, HDL, LDLCALC, TRIG, CHOLHDL, LDLDIRECT,  in the last 72 hours Thyroid Function Tests: No results found for this basename: TSH, T4TOTAL, FREET4, T3FREE, THYROIDAB,  in the last 72 hours Anemia Panel: No results found for this basename: VITAMINB12, FOLATE, FERRITIN, TIBC, IRON, RETICCTPCT,  in the last 72 hours Coagulation: No results found for this basename: LABPROT, INR,  in the last 72 hours Urine Drug Screen: Drugs of Abuse  No results found for this basename: labopia, cocainscrnur, labbenz, amphetmu, thcu, labbarb    Alcohol Level: No results found for this basename: ETH,  in the last 72 hours Urinalysis: No results found for this basename: COLORURINE, APPERANCEUR, LABSPEC, PHURINE, GLUCOSEU, HGBUR, BILIRUBINUR, KETONESUR, PROTEINUR, UROBILINOGEN, NITRITE, LEUKOCYTESUR,  in the last 72 hours Misc. Labs:  ABGS No results found for this basename: PHART, PCO2, PO2ART, TCO2, HCO3,  in the last 72 hours CULTURES No results found for this or any previous visit (from the past 240 hour(s)). Studies/Results: Dg Abd Acute W/chest  03/03/2013  *RADIOLOGY REPORT*  Clinical Data: Abdominal pain, weakness and shortness of breath.  ACUTE ABDOMEN SERIES (  ABDOMEN 2 VIEW & CHEST 1 VIEW)  Comparison: Previous examinations.  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  Stable mildly prominent interstitial markings.  Normal bowel gas pattern without free peritoneal air.  Right jugular catheter tip at the inferior aspect of the inferior vena cava.  Lower lumbar spine laminectomy defects and fixation hardware.  Cholecystectomy clips.  Marked left hip degenerative changes.   Right total hip prosthesis.  IMPRESSION:  1.  No acute abnormality. 2.  Stable cardiomegaly and mild chronic interstitial lung disease.   Original Report Authenticated By: Beckie Salts, M.D.     Medications:  Prior to Admission:  Prescriptions prior to admission  Medication Sig Dispense Refill  . amLODipine (NORVASC) 10 MG tablet Take 10 mg by mouth daily.      Marland Kitchen aspirin EC 81 MG tablet Take 81 mg by mouth daily.      Marland Kitchen atorvastatin (LIPITOR) 40 MG tablet TAKE ONE TABLET BY MOUTH EVERY DAY AT 6PM  30 tablet  5  . Bromfenac Sodium (BROMDAY) 0.09 % SOLN Place 1 drop into the right eye 3 (three) times daily.      Marland Kitchen doxepin (SINEQUAN) 25 MG capsule Take 25 mg by mouth at bedtime.      Marland Kitchen estradiol (ESTRACE) 0.5 MG tablet Take 0.5 mg by mouth daily.        . fentaNYL (DURAGESIC - DOSED MCG/HR) 100 MCG/HR Place 1 patch onto the skin every 3 (three) days.      . hydrALAZINE (APRESOLINE) 25 MG tablet Take 25 mg by mouth every 8 (eight) hours.      . magnesium hydroxide (MILK OF MAGNESIA) 400 MG/5ML suspension Takes couple of tablespoons daily.      . metFORMIN (GLUCOPHAGE) 500 MG tablet Take 500 mg by mouth 2 (two) times daily with a meal.       . metoprolol tartrate (LOPRESSOR) 25 MG tablet Take 1 tablet (25 mg total) by mouth 2 (two) times daily.  30 tablet  6  . nitroGLYCERIN (NITROSTAT) 0.4 MG SL tablet Place 0.4 mg under the tongue every 5 (five) minutes as needed for chest pain.       Marland Kitchen ondansetron (ZOFRAN) 8 MG tablet Take 8 mg by mouth every 8 (eight) hours as needed for nausea.       . ondansetron (ZOFRAN-ODT) 8 MG disintegrating tablet Take 8 mg by mouth every 8 (eight) hours as needed. Nausea and Vomiting      . pantoprazole (PROTONIX) 40 MG tablet Take 1 tablet (40 mg total) by mouth 2 (two) times daily.  60 tablet  11  . phenytoin (DILANTIN) 100 MG ER capsule Take 300 mg by mouth at bedtime.        . potassium chloride (K-DUR) 10 MEQ tablet Take 2 tablets (20 mEq total) by mouth daily.  60  tablet  3  . promethazine (PHENERGAN) 25 MG tablet Take 1 tablet (25 mg total) by mouth every 6 (six) hours as needed for nausea.  15 tablet  0  . senna-docusate (SENOKOT-S) 8.6-50 MG per tablet Take 1 tablet by mouth daily as needed for constipation.        Scheduled: . amLODipine  10 mg Oral Daily  . aspirin EC  81 mg Oral Daily  . atorvastatin  20 mg Oral QPC supper  . Bromfenac Sodium  1 drop Right Eye TID  . doxepin  25 mg Oral QHS  . enoxaparin (LOVENOX) injection  40 mg Subcutaneous Q24H  . estradiol  0.5 mg Oral Daily  . fentaNYL  100 mcg Transdermal Q72H  . hydrALAZINE  25 mg Oral Q8H  . metFORMIN  500 mg Oral BID WC  . metoprolol tartrate  25 mg Oral BID  . pantoprazole  40 mg Oral BID  . phenytoin  300 mg Oral QHS  . potassium chloride  20 mEq Oral Daily  . sodium chloride  10-40 mL Intracatheter Q12H   Continuous: . sodium chloride 75 mL/hr at 03/04/13 7829   FAO:ZHYQMVHQI hydroxide, nitroGLYCERIN, ondansetron, promethazine, sodium chloride  Assesment: She has recurrent nausea vomiting and diarrhea. 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 She was hypokalemic and is being repleted Active Problems:   * No active hospital problems. *    Plan: I will asked for GI consultation. Continue all of the other treatments    LOS: 1 day   Lamichael Youkhana L 03/04/2013, 8:59 AM

## 2013-03-04 NOTE — Care Management Note (Unsigned)
    Page 1 of 1   03/04/2013     11:19:55 AM   CARE MANAGEMENT NOTE 03/04/2013  Patient:  [name],[name]   Account Number:  [id number]  Date Initiated:  03/04/2013  Documentation initiated by:  Sharrie Rothman  Subjective/Objective Assessment:   Pt admitted from home with nausea, vomiting, and diarrhea. Pt lives with her husband and daughter. Pt has a CAP aide M-F, 7.5 hours daily. Pt will return home at discharge. Pt is fairly indpendent but has a cane.     Action/Plan:   No CM needs noted. Will continue to follow.   Anticipated DC Date:  03/07/2013   Anticipated DC Plan:  HOME/SELF CARE      DC Planning Services  CM consult      Choice offered to / List presented to:             Status of service:  In process, will continue to follow Medicare Important Message given?   (If response is "NO", the following Medicare IM given date fields will be blank) Date Medicare IM given:   Date Additional Medicare IM given:    Discharge Disposition:    Per UR Regulation:    If discussed at Long Length of Stay Meetings, dates discussed:    Comments:  03/04/13 1120 Arlyss Queen, RN BSN CM

## 2013-03-04 NOTE — Consult Note (Signed)
REVIEWED.  LAST DIARRHEA SAT. LAST VOMITING SUN. NAUSEOUS THIS AM. SUPPORTIVE CARE. PT CLINICALLY IMPROVED. OBTAIN STOOL IF POSSIBLE. ANTI-EMETIC PRN. ADVANCE DIET AS TOLERATED STARTING ON APR 1.

## 2013-03-04 NOTE — Progress Notes (Signed)
Notified Dr. Juanetta Gosling of patients  Systolic blood pressure less than 100.  I voiced I held all heart related medications for to day that I had scheduled.  Also recommended removing the central line and placing a PICC instead and CBG's due to poor meal intake and metformin.  New orders given and followed.

## 2013-03-04 NOTE — Progress Notes (Signed)
UR Chart Review Completed  

## 2013-03-04 NOTE — Progress Notes (Signed)
ANTICOAGULATION CONSULT NOTE - Initial Consult  Pharmacy Consult for Lovenox Indication: VTE prophylaxis  Allergies  Allergen Reactions  . Codeine     "good" headache  . Other     Leafy raw vegetables and seeds  . Oxycodone Hcl     "good" headache; also OxyContin   Patient Measurements: Height: 5\' 2"  (157.5 cm) Weight: 194 lb 3.6 oz (88.1 kg) IBW/kg (Calculated) : 50.1  Vital Signs: Temp: 99.3 F (37.4 C) (03/31 0541) Temp src: Oral (03/31 0541) BP: 188/77 mmHg (03/31 0541) Pulse Rate: 60 (03/31 0541)  Labs:  Recent Labs  03/03/13 1440 03/04/13 0503  HGB 11.1*  --   HCT 33.1*  --   PLT 235  --   CREATININE 1.35* 1.27*   Estimated Creatinine Clearance: 40.7 ml/min (by C-G formula based on Cr of 1.27).  Medical History: Past Medical History  Diagnosis Date  . Diabetes mellitus, type II   . Hypertension   . Arteriosclerotic cardiovascular disease (ASCVD)     multivessel, CABG 6/10.  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  Myoview (11/20/11) showed no ischemia & EF 62%  . Gastroesophageal reflux disease     Hiatal hernia  . Hyperlipidemia   . Deep vein thrombosis, upper right extremity     post-PPM  . Obesity   . DDD (degenerative disc disease)   . Dehiscence of closure of sternum or sternotomy 07/2009    sternal infection; required flap closure  . COPD (chronic obstructive pulmonary disease)     severe lung disease by PFTs 6/12  . Sleep apnea   . Seizure disorder   . 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 [COMPLETED] sodium chloride   Intravenous Once  . amLODipine  10 mg Oral Daily  . aspirin EC  81 mg Oral Daily  . atorvastatin  20 mg Oral QPC supper  . Bromfenac Sodium  1 drop Right Eye TID  . doxepin  25 mg Oral QHS  . enoxaparin (LOVENOX) injection  40 mg Subcutaneous Q24H  . estradiol  0.5 mg Oral Daily  . fentaNYL  100 mcg Transdermal Q72H  . hydrALAZINE  25 mg Oral Q8H  . metFORMIN   500 mg Oral BID WC  . metoprolol tartrate  25 mg Oral BID  . [COMPLETED]  morphine injection  4 mg Intramuscular Once  . [COMPLETED] ondansetron  8 mg Oral Once  . [COMPLETED] ondansetron  8 mg Oral Once  . pantoprazole  40 mg Oral BID  . phenytoin  300 mg Oral QHS  . potassium chloride  20 mEq Oral Daily  . potassium chloride  10 mEq Intravenous Q1 Hr x 4  . [COMPLETED] sodium chloride  500 mL Intravenous Once  . [COMPLETED] sodium chloride  500 mL Intravenous Once  . sodium chloride  10-40 mL Intracatheter Q12H  . [DISCONTINUED] estradiol  0.5 mg Oral Daily  . [DISCONTINUED] phenytoin  300 mg Oral QHS    Assessment: 71yo female with clcr > 30.  Estimated Creatinine Clearance: 40.7 ml/min (by C-G formula based on Cr of 1.27).  Goal of Therapy:  VTE prophylaxis Monitor platelets by anticoagulation protocol: Yes   Plan:  Lovenox 40mg  SQ q24hrs  Valrie Hart A 03/04/2013,11:05 AM

## 2013-03-04 NOTE — Consult Note (Signed)
Referring Provider: Dr. Juanetta Gosling Primary Gastroenterologist:  Dr. Jena Gauss   Date of Admission: 03/03/13 Date of Consultation: 03/04/13  Reason for Consultation:  Nausea, Vomiting, Diarrhea  HPI:  [name] is a 72 year old female well-known to our practice with a history of diabetic gastroparesis, PUD secondary to NSAIDs. She has had prior admissions secondary to similar symptoms. She had a follow-up EGD March 2014 to verify ulcer healing. EGD by Dr. Jena Gauss noted small hiatal hernia, healed gastric ulcer. Colonoscopy performed at same time with internal hemorrhoids and pancolonic diverticulosis. Admitted secondary to recurrent N/V/D, volume depletion, hypokalemia. She was actually seen by myself in clinic on February 13, 2013. Protonix was increased to twice a day at that time, with gastroparesis diet provided.  States vomiting started Friday but "not bad". "just light". States threw up about 10 times. No abdominal pain. States she had 2 loose stools on Friday. 3 loose stools Saturday. States N/V worsened over the weekend. No loose stools since admitted. Felt nauseated this morning. +chills. Denies any sick contacts. No recent antibiotics. Tolerated clear liquids for lunch. Ate broth and ginger ale. Baseline is constipation.   Husband, daughter, son-in-law, granddaughter live with patient. Daughter helps with medications.   Past Medical History  Diagnosis Date  . Diabetes mellitus, type II   . Hypertension   . Arteriosclerotic cardiovascular disease (ASCVD)     multivessel, CABG 6/10.  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  Myoview (11/20/11) showed no ischemia & EF 62%  . Gastroesophageal reflux disease     Hiatal hernia  . Hyperlipidemia   . Deep vein thrombosis, upper right extremity     post-PPM  . Obesity   . DDD (degenerative disc disease)   . Dehiscence of closure of sternum or sternotomy 07/2009    sternal infection; required flap closure  . COPD (chronic obstructive pulmonary disease)     severe lung  disease by PFTs 6/12  . Sleep apnea   . Seizure disorder   . 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 Pacemaker placement  1993    Status post DDD pacemaker implantation in 1993, with Medtronic Kappa generator change in 2003 for  second-degree AV block, most recent gen change by Fawn Kirk 04/14/11  . Revision total hip arthroplasty    . Replacement total knee      Left  . Thyroid surgery    . Wrist surgery    . 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 Reconstructive repair sternal      for infection s/p CABG 8/10  . Back surgery    . Esophagogastroduodenoscopy      with [facility] dilation,  biopsy, and disruption Schatzki's ring  . Colonoscopy  04/27/2005    hemorrhoids, diverticula  . Esophagogastroduodenoscopy  09/10/2012    RMR: Noncritical Schatzki's ring. Diffuse gastric erosions and an  area of partially healing ulceration-status post biopsy/ Small hiatal hernia. Bx reactive gastropathy.  . Abdominal hysterectomy    . Colonoscopy with esophagogastroduodenoscopy (egd) N/A 02/20/2013    RMR: pancolonic diverticulosis and internal hemorrhoids. EGD with small hiatal hernia, healed gastric ulcer    Prior to Admission medications   Medication Sig Start Date End Date Taking? Authorizing Provider  amLODipine (NORVASC) 10 MG tablet Take 10 mg by mouth daily. 11/21/11  Yes Laurann Montana, PA-C  aspirin EC 81 MG tablet Take 81 mg by mouth daily.   Yes Historical Provider, MD  atorvastatin (LIPITOR) 40 MG tablet TAKE ONE TABLET BY MOUTH EVERY DAY AT 6PM 12/18/12  Yes Cassell Clement, MD  Bromfenac Sodium (BROMDAY) 0.09 % SOLN Place 1 drop into the right eye 3 (three) times daily.   Yes Historical Provider, MD  doxepin (SINEQUAN) 25 MG capsule Take 25 mg by mouth at bedtime.   Yes Historical Provider, MD  estradiol (ESTRACE) 0.5 MG tablet Take 0.5 mg by mouth  daily.     Yes Historical Provider, MD  fentaNYL (DURAGESIC - DOSED MCG/HR) 100 MCG/HR Place 1 patch onto the skin every 3 (three) days.   Yes Historical Provider, MD  hydrALAZINE (APRESOLINE) 25 MG tablet Take 25 mg by mouth every 8 (eight) hours. 11/21/11  Yes Dayna N Dunn, PA-C  magnesium hydroxide (MILK OF MAGNESIA) 400 MG/5ML suspension Takes couple of tablespoons daily.   Yes Historical Provider, MD  metFORMIN (GLUCOPHAGE) 500 MG tablet Take 500 mg by mouth 2 (two) times daily with a meal.    Yes Historical Provider, MD  metoprolol tartrate (LOPRESSOR) 25 MG tablet Take 1 tablet (25 mg total) by mouth 2 (two) times daily. 10/24/12  Yes Dyann Kief, PA-C  nitroGLYCERIN (NITROSTAT) 0.4 MG SL tablet Place 0.4 mg under the tongue every 5 (five) minutes as needed for chest pain.  11/21/11  Yes Dayna N Dunn, PA-C  ondansetron (ZOFRAN) 8 MG tablet Take 8 mg by mouth every 8 (eight) hours as needed for nausea.    Yes Historical Provider, MD  ondansetron (ZOFRAN-ODT) 8 MG disintegrating tablet Take 8 mg by mouth every 8 (eight) hours as needed. Nausea and Vomiting 08/24/12  Yes Geoffery Lyons, MD  pantoprazole (PROTONIX) 40 MG tablet Take 1 tablet (40 mg total) by mouth 2 (two) times daily. 02/13/13  Yes Nira Retort, NP  phenytoin (DILANTIN) 100 MG ER capsule Take 300 mg by mouth at bedtime.     Yes Historical Provider, MD  potassium chloride (K-DUR) 10 MEQ tablet Take 2 tablets (20 mEq total) by mouth daily. 11/09/12  Yes Scott T Alben Spittle, PA-C  promethazine (PHENERGAN) 25 MG tablet Take 1 tablet (25 mg total) by mouth every 6 (six) hours as needed for nausea. 09/16/12  Yes Donnetta Hutching, MD  senna-docusate (SENOKOT-S) 8.6-50 MG per tablet Take 1 tablet by mouth daily as needed for constipation.    Yes Historical Provider, MD    Current Facility-Administered Medications  Medication Dose Route Frequency Provider Last Rate Last Dose  . 0.9 %  sodium chloride infusion   Intravenous Continuous Raeford Razor,  MD 75 mL/hr at 03/04/13 [phone]    . amLODipine (NORVASC) tablet 10 mg  10 mg Oral Daily Isabella Stalling, MD   10 mg at 03/03/13 1951  . aspirin EC tablet 81 mg  81 mg Oral Daily Isabella Stalling, MD   81 mg at 03/04/13 1431  . atorvastatin (LIPITOR) tablet 20 mg  20 mg Oral QPC supper Isabella Stalling, MD   20 mg at 03/03/13 1949  . Bromfenac Sodium 0.09 % SOLN 1 drop  1 drop Right Eye TID Isabella Stalling, MD   1 drop at 03/04/13 1431  . doxepin (SINEQUAN) capsule 25 mg  25 mg Oral QHS Isabella Stalling, MD   25 mg at 03/03/13 2250  . enoxaparin (LOVENOX) injection 40 mg  40 mg Subcutaneous Q24H Fredirick Maudlin, MD  40 mg at 03/03/13 2249  . estradiol (ESTRACE) tablet 0.5 mg  0.5 mg Oral Daily Fredirick Maudlin, MD   0.5 mg at 03/04/13 1431  . fentaNYL (DURAGESIC - dosed mcg/hr) 100 mcg  100 mcg Transdermal Q72H Isabella Stalling, MD   100 mcg at 03/03/13 1947  . hydrALAZINE (APRESOLINE) tablet 25 mg  25 mg Oral Q8H Isabella Stalling, MD   25 mg at 03/04/13 [phone]  . magnesium hydroxide (MILK OF MAGNESIA) suspension 5 mL  5 mL Oral Daily PRN Isabella Stalling, MD      . metFORMIN (GLUCOPHAGE) tablet 500 mg  500 mg Oral BID WC Isabella Stalling, MD      . metoprolol tartrate (LOPRESSOR) tablet 25 mg  25 mg Oral BID Isabella Stalling, MD   25 mg at 03/03/13 1949  . nitroGLYCERIN (NITROSTAT) SL tablet 0.4 mg  0.4 mg Sublingual Q5 min PRN Isabella Stalling, MD      . ondansetron [facility]) tablet 8 mg  8 mg Oral Q8H PRN Isabella Stalling, MD      . pantoprazole (PROTONIX) EC tablet 40 mg  40 mg Oral BID Isabella Stalling, MD   40 mg at 03/04/13 1432  . phenytoin (DILANTIN) ER capsule 300 mg  300 mg Oral QHS Isabella Stalling, MD   300 mg at 03/03/13 2250  . potassium chloride (K-DUR,KLOR-CON) CR tablet 20 mEq  20 mEq Oral Daily Isabella Stalling, MD   20 mEq at 03/04/13 1432  . promethazine (PHENERGAN) tablet 25 mg  25 mg Oral Q6H PRN Isabella Stalling, MD   25 mg at 03/04/13 9604  .  sodium chloride 0.9 % injection 10-40 mL  10-40 mL Intracatheter Q12H Dalia Heading, MD   10 mL at 03/03/13 1602  . sodium chloride 0.9 % injection 10-40 mL  10-40 mL Intracatheter PRN Dalia Heading, MD        Allergies as of 03/03/2013 - Review Complete 03/03/2013  Allergen Reaction Noted  . Codeine  05/13/2009  . Other  03/23/2012  . Oxycodone hcl      Family History  Problem Relation Age of Onset  . Diabetes    . Heart disease Mother     MI  . Coronary artery disease      Female <55  . Cancer    . Colon cancer Father     age 89    History   Social History  . Marital Status: Married    Spouse Name: N/A    Number of Children: N/A  . Years of Education: N/A   Occupational History  . Not on file.   Social History Main Topics  . Smoking status: Never Smoker   . Smokeless tobacco: Never Used  . Alcohol Use: No  . Drug Use: No  . Sexually Active: Not Currently    Birth Control/ Protection: Post-menopausal, Surgical   Other Topics Concern  . Not on file   Social History Narrative  . No narrative on file    Review of Systems: Gen: SEE HPI Cardiac: Denies chest pain, palpitations  Resp: +cough GI: see HPI GU : Denies urinary burning, urinary frequency, urinary incontinence.  MS: + lower extremity edema Derm: Denies rash, itching, dry skin Psych: Denies depression, anxiety,confusion, or memory loss Heme: Denies bruising, bleeding, and enlarged lymph nodes.  Physical Exam: Vital signs in last 24 hours: Temp:  [98.2 F (36.8 C)-99.3 F (37.4 C)] 98.2 F (  36.8 C) (03/31 1259) Pulse Rate:  [60-86] 65 (03/31 1259) Resp:  [16-24] 20 (03/31 1259) BP: (95-188)/(59-95) 95/59 mmHg (03/31 1259) SpO2:  [92 %-100 %] 96 % (03/31 1259) Weight:  [194 lb 3.6 oz (88.1 kg)] 194 lb 3.6 oz (88.1 kg) (03/30 1816) Last BM Date: 03/03/13 General:   Alert,  Well-developed, well-nourished, pleasant and cooperative in NAD Head:  Normocephalic and atraumatic. Eyes:  Sclera  clear, no icterus.   Conjunctiva pink. Ears:  Normal auditory acuity. Nose:  No deformity, discharge,  or lesions. Mouth:  No deformity or lesions, edentulous.  Neck:  Supple; no masses or thyromegaly. Lungs:  Clear throughout to auscultation.   No wheezes, crackles, or rhonchi. No acute distress. Heart:  Regular rate and rhythm; no murmurs, clicks, rubs,  or gallops. Abdomen:  Soft, nontender and nondistended. Obese. Possible umbilical hernia. No masses, hepatosplenomegaly. Normal bowel sounds, without guarding, and without rebound.   Rectal:  Deferred  Msk:  Symmetrical without gross deformities. Normal posture. Extremities:  1+ pedal edema Neurologic:  Alert and  oriented x4;  grossly normal neurologically. Skin:  Intact without significant lesions or rashes. Cervical Nodes:  No significant cervical adenopathy. Psych:  Alert and cooperative. Normal mood and affect.  Intake/Output from previous day: 03/30 0701 - 03/31 0700 In: 1340 [P.O.:240; I.V.:800; IV Piggyback:300] Out: 700 [Urine:700] Intake/Output this shift:    Lab Results:  Recent Labs  03/03/13 1440  WBC 15.5*  HGB 11.1*  HCT 33.1*  PLT 235   BMET  Recent Labs  03/03/13 1440 03/04/13 0503  NA 135 139  K 2.9* 3.2*  CL 90* 99  CO2 27 28  GLUCOSE 133* 128*  BUN 17 16  CREATININE 1.35* 1.27*  CALCIUM 10.2 9.8   LFT  Recent Labs  03/03/13 1440 03/04/13 0503  PROT 8.4* 8.0  ALBUMIN 4.3 4.1  AST 28 32  ALT 14 16  ALKPHOS 105 100  BILITOT 0.6 0.6  BILIDIR 0.3 0.2  IBILI 0.3 0.4    Studies/Results: Dg Abd Acute W/chest  03/03/2013  *RADIOLOGY REPORT*  Clinical Data: Abdominal pain, weakness and shortness of breath.  ACUTE ABDOMEN SERIES (ABDOMEN 2 VIEW & CHEST 1 VIEW)  Comparison: Previous examinations.  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  Stable mildly prominent interstitial markings.  Normal bowel gas pattern without free  peritoneal air.  Right jugular catheter tip at the inferior aspect of the inferior vena cava.  Lower lumbar spine laminectomy defects and fixation hardware.  Cholecystectomy clips.  Marked left hip degenerative changes.  Right total hip prosthesis.  IMPRESSION:  1.  No acute abnormality. 2.  Stable cardiomegaly and mild chronic interstitial lung disease.   Original Report Authenticated By: Beckie Salts, M.D.     Impression: 72 year old female with history of PUD and gastroparesis, now admitted with acute nausea, vomiting, diarrhea, hypokalemia. She has clinically improved, with absence of loose stools since admission and no further vomiting. Last EGD and colonoscopy Feb 2014 by Dr. Jena Gauss as outlined above, with documentation of healed gastric ulcer. No exposure to antibiotics or sick contacts. Will obtain stool studies and provide supportive care in interim. Reglan contraindicated due to treatment failure in past (confusion).   Plan: Cdiff PCR and stool culture Add probiotic PPI BID Supportive care Zofran prn nausea    LOS: 1 day    03/04/2013, 3:10 PM

## 2013-03-04 NOTE — H&P (Signed)
[name], [name]                ACCOUNT NO.:  [id number]  MEDICAL RECORD NO.:  [id number]  LOCATION:  A337                          FACILITY:  [facility]  PHYSICIAN:  Melvyn Novas, MDDATE OF BIRTH:  March 29, 1941  DATE OF ADMISSION:  03/03/2013 DATE OF DISCHARGE:  LH                             HISTORY & PHYSICAL   HISTORY OF PRESENT ILLNESS:  The patient is a 72 year old female, who presents to the emergency department with nausea, vomiting, diarrhea for 36 hour period.  She just seems to vomit bilious material, no food. She has had 3 episodes of diarrhea in the last 12 hours, and she has decreased urinary output, and came in with significant hypokalemia with a potassium of 2.9.  She is admitted for volume depletion, gastroenteritis, hypokalemia, and her concomitant multiple medical issues.  She has type 2 diabetes.  PAST MEDICAL HISTORY: 1. Type 2 diabetes. 2. Hypertension. 3. Coronary artery disease status post CABG in 2010. 4. GERD. 5. Hyperlipidemia. 6. History of deep venous thrombosis of right upper extremity. 7. Degenerative disk disease. 8. COPD. 9. Sleep apnea. 10.Seizure disorder. 11.History of AV block status post pacemaker, dual chamber.  PAST SURGICAL HISTORY:  Remarkable for pacemaker placement in 2003, revision of total hip arthroplasty, total knee replacement of the left, thyroid lobectomy, carpal tunnel syndrome, and coronary artery bypass surgery with a LIMA to LAD, saphenous veins to OM, SFG and PDA.  She has also had a colonoscopy and EGD revealing Schatzki ring, some gastric erosions previously by Dr. Doylene Canard.  Cholecystectomy and vaginal hysterectomy.  SOCIAL HISTORY:  She does not smoke.  Does not imbibe alcohol.  Lives with family member.  CURRENT MEDICINES: 1. Norvasc 10 p.o. daily. 2. Aspirin 81 p.o. daily. 3. Lipitor 40 p.o. daily. 4. Doxepin 25 mg p.o. at bedtime. 5. Estrace 0.5 p.o. daily. 6. Fentanyl 100 mcg/hour every 72 hours. 7.  Hydralazine 25 mg q.8 hours. 8. Metformin 500 mg p.o. b.i.d. 9. Toprol tartrate 25 mg p.o. b.i.d. 10.Sublingual nitroglycerin 0.4 p.r.n. 11.Zofran tablets 8 mg q.8 hours. 12.Protonix 40 mg p.o. daily. 13.Dilantin 100 mg 3 tablets p.o. at bedtime. 14.Potassium chloride 2 tablets of 20 mEq each by mouth daily 40 p.o.     daily.  PHYSICAL EXAMINATION:  VITAL SIGNS:  Blood pressure is 164/105, pulse is 91 and regular, respiratory rate is 20, O2 sat is 99%. HEENT:  Eyes, PERRLA intact.  Sclerae clear.  Conjunctivae pink. NECK:  No JVD.  No carotid bruits.  No thyromegaly.  No thyroid bruits. LUNGS:  Diminished breath sounds at bases.  No rales, wheeze, or rhonchi appreciable. HEART:  Regular rhythm.  No murmurs, gallops, heaves, thrills, or rubs. ABDOMEN:  Soft, nontender.  Bowel sounds normoactive.  No guarding, rebound, mass, or megaly. EXTREMITIES:  Trace to 1+ pedal edema bilaterally. NEUROLOGIC:  The patient is alert and oriented.  Cranial nerves II through XII grossly intact.  The patient moves all 4 extremities.  IMPRESSION: 1. Intravascular volume depletion. 2. Hypokalemia. 3. Gastroenteritis. 4. Hypertension. 5. Coronary artery disease, status post coronary artery bypass     grafting. 6. Dual-chamber pacemaker for atrioventricular block. 7. Seizure disorder. 8. Hypertension. 9. Hyperlipidemia.  PLAN:  To admit as per orders.     Melvyn Novas, MD     RMD/MEDQ  D:  03/03/2013  T:  03/03/2013  Job:  [id number]

## 2013-03-04 NOTE — Progress Notes (Signed)
INITIAL NUTRITION ASSESSMENT  DOCUMENTATION CODES Per approved criteria  -Severe malnutrition in the context of acute illness or injury   INTERVENTION: Resource Breeze po TID, each supplement provides 250 kcal and 9 grams of protein.  NUTRITION DIAGNOSIS: Malnutrition  related to inadequate oral intake as evidenced by diet hx and food avoidance due to fear of vomiting and acute wt loss 4%, 8# in 11 days.   Goal: Pt to meet >/= 90% of their estimated nutrition needs  Monitor:  Diet advancement and tolerance, po intake, labs and wt trends  Reason for Assessment: Malnurtriton Screen  72 y.o. female  Admitting c/o: Nausea, vomiting an diarrhea   ASSESSMENT: Pt very pleasant lady who presented to ED with N/V/D since last Thursday. Her breakfast tray is untouched and she tells me that she's afraid to eat due to nausea and vomiting over past several days. Acute wt loss, dehydrated on admission. Hypomagnesemic likely due to inadequate oral intake.  She is s/p central venous cathter placement (triple lumen in right femoral vein).  GI consult pending. Pt meets criteria for severe malnutrition in the context of acute illness given her energy intake </= 50% for >/= 5 days and > 2% wt loss in 1 week  Height: Ht Readings from Last 1 Encounters:  03/03/13 5\' 2"  (1.575 m)    Weight: Wt Readings from Last 1 Encounters:  03/03/13 194 lb 3.6 oz (88.1 kg)    Ideal Body Weight: 110# (50 kg)  % Ideal Body Weight: 176%  Wt Readings from Last 10 Encounters:  03/03/13 194 lb 3.6 oz (88.1 kg)  02/20/13 202 lb (91.627 kg)  02/20/13 202 lb (91.627 kg)  02/14/13 202 lb (91.627 kg)  02/13/13 204 lb 3.2 oz (92.625 kg)  01/11/13 202 lb 4 oz (91.74 kg)  11/21/12 198 lb 9.6 oz (90.084 kg)  10/24/12 216 lb (97.977 kg)  09/16/12 205 lb (92.987 kg)  09/13/12 225 lb 11.2 oz (102.377 kg)    Usual Body Weight: 202# per pt  % Usual Body Weight: 96%  BMI:  Body mass index is 35.52  kg/(m^2).Obesity Class II  Estimated Nutritional Needs: Kcal: 1500-1735 Protein: 80-90 gr Fluid: >2000 ml/day  Skin: no issues noted  Diet Order: Clear Liquid  EDUCATION NEEDS: -Education not appropriate at this time   Intake/Output Summary (Last 24 hours) at 03/04/13 1446 Last data filed at 03/04/13 0608  Gross per 24 hour  Intake   1340 ml  Output    700 ml  Net    640 ml    Last BM: 03/03/13   Labs:   Recent Labs Lab 03/03/13 1440 03/04/13 0503  NA 135 139  K 2.9* 3.2*  CL 90* 99  CO2 27 28  BUN 17 16  CREATININE 1.35* 1.27*  CALCIUM 10.2 9.8  MG 1.3*  --   GLUCOSE 133* 128*    CBG (last 3)   Recent Labs  03/03/13 2136 03/04/13 0759 03/04/13 1120  GLUCAP 124* 113* 116*    Scheduled Meds: . amLODipine  10 mg Oral Daily  . aspirin EC  81 mg Oral Daily  . atorvastatin  20 mg Oral QPC supper  . Bromfenac Sodium  1 drop Right Eye TID  . doxepin  25 mg Oral QHS  . enoxaparin (LOVENOX) injection  40 mg Subcutaneous Q24H  . estradiol  0.5 mg Oral Daily  . fentaNYL  100 mcg Transdermal Q72H  . hydrALAZINE  25 mg Oral Q8H  . metFORMIN  500  mg Oral BID WC  . metoprolol tartrate  25 mg Oral BID  . pantoprazole  40 mg Oral BID  . phenytoin  300 mg Oral QHS  . potassium chloride  20 mEq Oral Daily  . sodium chloride  10-40 mL Intracatheter Q12H    Continuous Infusions: . sodium chloride 75 mL/hr at 03/04/13 7829    Past Medical History  Diagnosis Date  . Diabetes mellitus, type II   . Hypertension   . Arteriosclerotic cardiovascular disease (ASCVD)     multivessel, CABG 6/10.  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  Myoview (11/20/11) showed no ischemia & EF 62%  . Gastroesophageal reflux disease     Hiatal hernia  . Hyperlipidemia   . Deep vein thrombosis, upper right extremity     post-PPM  . Obesity   . DDD (degenerative disc disease)   . Dehiscence of closure of sternum or sternotomy 07/2009    sternal infection; required flap closure  . COPD (chronic  obstructive pulmonary disease)     severe lung disease by PFTs 6/12  . Sleep apnea   . Seizure disorder   . 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 Pacemaker placement  1993    Status post DDD pacemaker implantation in 1993, with [employer]tronic Kappa generator change in 2003 for  second-degree AV block, most recent gen change by Fawn Kirk 04/14/11  . Revision total hip arthroplasty    . Replacement total knee      Left  . Thyroid surgery    . Wrist surgery    . 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 Reconstructive repair sternal      for infection s/p CABG 8/10  . Back surgery    . Esophagogastroduodenoscopy      with [facility] dilation,  biopsy, and disruption Schatzki's ring  . Colonoscopy  04/27/2005    hemorrhoids, diverticula  . Esophagogastroduodenoscopy  09/10/2012    RMR: Noncritical Schatzki's ring. Diffuse gastric erosions and an  area of partially healing ulceration-status post biopsy/ Small hiatal hernia. Bx reactive gastropathy.  . Abdominal hysterectomy    . Colonoscopy with esophagogastroduodenoscopy (egd) N/A 02/20/2013    Procedure: COLONOSCOPY WITH ESOPHAGOGASTRODUODENOSCOPY (EGD);  Surgeon: Corbin Ade, MD;  Location: [facility];  Service: Endoscopy;  Laterality: N/A;  8:30    Royann Shivers MS,RD,LDN,CSG Office: [phone] Pager: [phone]

## 2013-03-05 ENCOUNTER — Telehealth: Payer: Self-pay | Admitting: Orthopedic Surgery

## 2013-03-05 ENCOUNTER — Encounter (HOSPITAL_COMMUNITY): Payer: PRIVATE HEALTH INSURANCE

## 2013-03-05 ENCOUNTER — Ambulatory Visit (HOSPITAL_COMMUNITY): Payer: PRIVATE HEALTH INSURANCE | Admitting: Physical Therapy

## 2013-03-05 DIAGNOSIS — E860 Dehydration: Principal | ICD-10-CM | POA: Diagnosis present

## 2013-03-05 DIAGNOSIS — K529 Noninfective gastroenteritis and colitis, unspecified: Secondary | ICD-10-CM | POA: Diagnosis present

## 2013-03-05 LAB — GLUCOSE, CAPILLARY
Glucose-Capillary: 111 mg/dL — ABNORMAL HIGH (ref 70–99)
Glucose-Capillary: 104 mg/dL — ABNORMAL HIGH (ref 70–99)

## 2013-03-05 LAB — BASIC METABOLIC PANEL
CO2: 24 mEq/L (ref 19–32)
Calcium: 8.5 mg/dL (ref 8.4–10.5)
Chloride: 102 mEq/L (ref 96–112)
Potassium: 3.1 mEq/L — ABNORMAL LOW (ref 3.5–5.1)
Sodium: 137 mEq/L (ref 135–145)

## 2013-03-05 LAB — HEPATIC FUNCTION PANEL
ALT: 14 U/L (ref 0–35)
AST: 22 U/L (ref 0–37)
Albumin: 3.3 g/dL — ABNORMAL LOW (ref 3.5–5.2)
Alkaline Phosphatase: 80 U/L (ref 39–117)
Total Protein: 6.4 g/dL (ref 6.0–8.3)

## 2013-03-05 LAB — LIPASE, BLOOD: Lipase: 42 U/L (ref 11–59)

## 2013-03-05 MED ORDER — BISACODYL 5 MG PO TBEC
5.0000 mg | DELAYED_RELEASE_TABLET | Freq: Every day | ORAL | Status: DC | PRN
  Administered 2013-03-05: 5 mg via ORAL
  Filled 2013-03-05: qty 1

## 2013-03-05 NOTE — Progress Notes (Signed)
Subjective: She feels better. She has less abdominal discomfort no vomiting and no diarrhea. She actually complains of some constipation now.  Objective: Vital signs in last 24 hours: Temp:  [97.5 F (36.4 C)-98.2 F (36.8 C)] 97.5 F (36.4 C) (04/01 0604) Pulse Rate:  [65-96] 69 (04/01 0604) Resp:  [20] 20 (04/01 0604) BP: (95-131)/(59-80) 131/80 mmHg (04/01 0604) SpO2:  [96 %-99 %] 97 % (04/01 0604) Weight change:  Last BM Date: 03/03/13  Intake/Output from previous day: 03/31 0701 - 04/01 0700 In: 2061.3 [P.O.:240; I.V.:1821.3] Out: -   PHYSICAL EXAM General appearance: alert, cooperative, no distress and morbidly obese Resp: clear to auscultation bilaterally Cardio: regular rate and rhythm, S1, S2 normal, no murmur, click, rub or gallop GI: soft, non-tender; bowel sounds normal; no masses,  no organomegaly Extremities: extremities normal, atraumatic, no cyanosis or edema  Lab Results:    Basic Metabolic Panel:  Recent Labs  40/98/11 1440 03/04/13 0503 03/05/13 0509  NA 135 139 137  K 2.9* 3.2* 3.1*  CL 90* 99 102  CO2 27 28 24   GLUCOSE 133* 128* 106*  BUN 17 16 22   CREATININE 1.35* 1.27* 1.72*  CALCIUM 10.2 9.8 8.5  MG 1.3*  --   --    Liver Function Tests:  Recent Labs  03/04/13 0503 03/05/13 0509  AST 32 22  ALT 16 14  ALKPHOS 100 80  BILITOT 0.6 0.3  PROT 8.0 6.4  ALBUMIN 4.1 3.3*    Recent Labs  03/04/13 0503 03/05/13 0509  LIPASE 30 42   No results found for this basename: AMMONIA,  in the last 72 hours CBC:  Recent Labs  03/03/13 1440  WBC 15.5*  NEUTROABS 12.5*  HGB 11.1*  HCT 33.1*  MCV 83.2  PLT 235   Cardiac Enzymes: No results found for this basename: CKTOTAL, CKMB, CKMBINDEX, TROPONINI,  in the last 72 hours BNP: No results found for this basename: PROBNP,  in the last 72 hours D-Dimer: No results found for this basename: DDIMER,  in the last 72 hours CBG:  Recent Labs  03/03/13 2136 03/04/13 0759  03/04/13 1120 03/04/13 1654 03/04/13 2016 03/05/13 0731  GLUCAP 124* 113* 116* 78 111* 105*   Hemoglobin A1C: No results found for this basename: HGBA1C,  in the last 72 hours Fasting Lipid Panel: No results found for this basename: CHOL, HDL, LDLCALC, TRIG, CHOLHDL, LDLDIRECT,  in the last 72 hours Thyroid Function Tests:  Recent Labs  03/04/13 0503  TSH 1.086   Anemia Panel: No results found for this basename: VITAMINB12, FOLATE, FERRITIN, TIBC, IRON, RETICCTPCT,  in the last 72 hours Coagulation: No results found for this basename: LABPROT, INR,  in the last 72 hours Urine Drug Screen: Drugs of Abuse  No results found for this basename: labopia, cocainscrnur, labbenz, amphetmu, thcu, labbarb    Alcohol Level: No results found for this basename: ETH,  in the last 72 hours Urinalysis:  Recent Labs  03/04/13 1456  COLORURINE YELLOW  LABSPEC 1.015  PHURINE 6.0  GLUCOSEU NEGATIVE  HGBUR NEGATIVE  BILIRUBINUR NEGATIVE  KETONESUR NEGATIVE  PROTEINUR TRACE*  UROBILINOGEN 0.2  NITRITE NEGATIVE  LEUKOCYTESUR NEGATIVE   Misc. Labs:  ABGS No results found for this basename: PHART, PCO2, PO2ART, TCO2, HCO3,  in the last 72 hours CULTURES No results found for this or any previous visit (from the past 240 hour(s)). Studies/Results: Dg Abd Acute W/chest  03/03/2013  *RADIOLOGY REPORT*  Clinical Data: Abdominal pain, weakness and shortness of  breath.  ACUTE ABDOMEN SERIES (ABDOMEN 2 VIEW & CHEST 1 VIEW)  Comparison: Previous examinations.  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  Stable mildly prominent interstitial markings.  Normal bowel gas pattern without free peritoneal air.  Right jugular catheter tip at the inferior aspect of the inferior vena cava.  Lower lumbar spine laminectomy defects and fixation hardware.  Cholecystectomy clips.  Marked left hip degenerative changes.  Right total hip prosthesis.   IMPRESSION:  1.  No acute abnormality. 2.  Stable cardiomegaly and mild chronic interstitial lung disease.   Original Report Authenticated By: Beckie Salts, M.D.     Medications:  Prior to Admission:  Prescriptions prior to admission  Medication Sig Dispense Refill  . amLODipine (NORVASC) 10 MG tablet Take 10 mg by mouth daily.      Marland Kitchen aspirin EC 81 MG tablet Take 81 mg by mouth daily.      Marland Kitchen atorvastatin (LIPITOR) 40 MG tablet TAKE ONE TABLET BY MOUTH EVERY DAY AT 6PM  30 tablet  5  . Bromfenac Sodium (BROMDAY) 0.09 % SOLN Place 1 drop into the right eye 3 (three) times daily.      Marland Kitchen doxepin (SINEQUAN) 25 MG capsule Take 25 mg by mouth at bedtime.      Marland Kitchen estradiol (ESTRACE) 0.5 MG tablet Take 0.5 mg by mouth daily.        . fentaNYL (DURAGESIC - DOSED MCG/HR) 100 MCG/HR Place 1 patch onto the skin every 3 (three) days.      . hydrALAZINE (APRESOLINE) 25 MG tablet Take 25 mg by mouth every 8 (eight) hours.      . magnesium hydroxide (MILK OF MAGNESIA) 400 MG/5ML suspension Takes couple of tablespoons daily.      . metFORMIN (GLUCOPHAGE) 500 MG tablet Take 500 mg by mouth 2 (two) times daily with a meal.       . metoprolol tartrate (LOPRESSOR) 25 MG tablet Take 1 tablet (25 mg total) by mouth 2 (two) times daily.  30 tablet  6  . nitroGLYCERIN (NITROSTAT) 0.4 MG SL tablet Place 0.4 mg under the tongue every 5 (five) minutes as needed for chest pain.       Marland Kitchen ondansetron (ZOFRAN) 8 MG tablet Take 8 mg by mouth every 8 (eight) hours as needed for nausea.       . ondansetron (ZOFRAN-ODT) 8 MG disintegrating tablet Take 8 mg by mouth every 8 (eight) hours as needed. Nausea and Vomiting      . pantoprazole (PROTONIX) 40 MG tablet Take 1 tablet (40 mg total) by mouth 2 (two) times daily.  60 tablet  11  . phenytoin (DILANTIN) 100 MG ER capsule Take 300 mg by mouth at bedtime.        . potassium chloride (K-DUR) 10 MEQ tablet Take 2 tablets (20 mEq total) by mouth daily.  60 tablet  3  . promethazine  (PHENERGAN) 25 MG tablet Take 1 tablet (25 mg total) by mouth every 6 (six) hours as needed for nausea.  15 tablet  0  . senna-docusate (SENOKOT-S) 8.6-50 MG per tablet Take 1 tablet by mouth daily as needed for constipation.        Scheduled: . acidophilus  1 capsule Oral Daily  . amLODipine  10 mg Oral Daily  . aspirin EC  81 mg Oral Daily  . atorvastatin  20 mg Oral QPC supper  . Bromfenac Sodium  1 drop Right Eye TID  .  doxepin  25 mg Oral QHS  . enoxaparin (LOVENOX) injection  40 mg Subcutaneous Q24H  . estradiol  0.5 mg Oral Daily  . feeding supplement  1 Container Oral TID BM  . fentaNYL  100 mcg Transdermal Q72H  . hydrALAZINE  25 mg Oral Q8H  . metFORMIN  500 mg Oral BID WC  . metoprolol tartrate  25 mg Oral BID  . pantoprazole  40 mg Oral BID AC  . phenytoin  300 mg Oral QHS  . potassium chloride  20 mEq Oral Daily  . sodium chloride  10-40 mL Intracatheter Q12H   Continuous: . sodium chloride 75 mL/hr at 03/04/13 2114   YQM:VHQIONGEX, magnesium hydroxide, nitroGLYCERIN, ondansetron, promethazine, sodium chloride  Assesment: She is much improved after her admission for nausea vomiting diarrhea and dehydration. This is a recurrent problem. Active Problems:   * No active hospital problems. *    Plan: Continue current medications and treatments I don't know she needs any other studies done to see why she has recurrent episodes I will advance her diet    LOS: 2 days   Seini Lannom L 03/05/2013, 8:31 AM

## 2013-03-05 NOTE — Telephone Encounter (Signed)
Patient's nurse's aide called to relay that patient needs to cancel her appointment for tomorrow, 03/06/13, due to having been admitted to [facility].  States will call back to re-schedule.  Patient's home ph (aide takes care of patient's husband, also) ph# [phone].

## 2013-03-05 NOTE — Progress Notes (Signed)
Pharmacist - Physician Communication  Concerning - Metformin Safe Administration  Description: The Pharmacy and Therapeutics Committee has adopted a policy that restricts the use of metformin in hospitalized patients until all the contraindications to administration have been ruled out.   Specific contraindications are:  Serum creatinine >/= 1.5 for males  Serum creatinine >/= 1.4 for females  Shock, acute MI, sepsis, hypoxemia, dehydration  Planned administration of intravenous iodinated contrast media  Heart failure patients with a low ejection fraction  Acute or chronic metabolic acidosis (indluding diabetic ketoacidosis).   Recommendation: Metformin order has been rejected and should only be reordered after the about conditions are ruled out or corrected.  Current safety recommendations include avoiding metformin for a minimum of 48 hours after the patient's exposure to intravenous contrast media.   Junita Push, PharmD, BCPS 03/05/2013@2 :43 PM

## 2013-03-05 NOTE — Progress Notes (Addendum)
Subjective: No N/V, abdominal pain, or further loose stools since admission. Tolerating diet.   Objective: Vital signs in last 24 hours: Temp:  [97.5 F (36.4 C)-98.2 F (36.8 C)] 97.5 F (36.4 C) (04/01 0604) Pulse Rate:  [65-96] 69 (04/01 0604) Resp:  [20] 20 (04/01 0604) BP: (95-131)/(59-80) 131/80 mmHg (04/01 0604) SpO2:  [96 %-99 %] 97 % (04/01 0604) Last BM Date: 03/03/13 General:   Alert and oriented, pleasant Head:  Normocephalic and atraumatic. Eyes:  No icterus, sclera clear. Conjuctiva pink.  Mouth:  Without lesions, mucosa pink and moist.  Heart:  S1, S2 present, no murmurs noted.  Lungs: Clear to auscultation bilaterally, without wheezing, rales, or rhonchi.  Abdomen:  Bowel sounds present, soft, non-tender, non-distended. Questionable umbilical hernia. Msk:  Symmetrical without gross deformities. Normal posture. Neurologic:  Alert and  oriented x4;  grossly normal neurologically. Skin:  Warm and dry, intact without significant lesions.  Psych:  Alert and cooperative. Normal mood and affect.  Intake/Output from previous day: 03/31 0701 - 04/01 0700 In: 2061.3 [P.O.:240; I.V.:1821.3] Out: -  Intake/Output this shift: Total I/O In: 240 [P.O.:240] Out: -   Lab Results:  Recent Labs  03/03/13 1440  WBC 15.5*  HGB 11.1*  HCT 33.1*  PLT 235   BMET  Recent Labs  03/03/13 1440 03/04/13 0503 03/05/13 0509  NA 135 139 137  K 2.9* 3.2* 3.1*  CL 90* 99 102  CO2 27 28 24   GLUCOSE 133* 128* 106*  BUN 17 16 22   CREATININE 1.35* 1.27* 1.72*  CALCIUM 10.2 9.8 8.5   LFT  Recent Labs  03/03/13 1440 03/04/13 0503 03/05/13 0509  PROT 8.4* 8.0 6.4  ALBUMIN 4.3 4.1 3.3*  AST 28 32 22  ALT 14 16 14   ALKPHOS 105 100 80  BILITOT 0.6 0.6 0.3  BILIDIR 0.3 0.2 <0.1  IBILI 0.3 0.4 NOT CALCULATED     Studies/Results: Dg Abd Acute W/chest  03/03/2013  *RADIOLOGY REPORT*  Clinical Data: Abdominal pain, weakness and shortness of breath.  ACUTE ABDOMEN  SERIES (ABDOMEN 2 VIEW & CHEST 1 VIEW)  Comparison: Previous examinations.  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  Stable mildly prominent interstitial markings.  Normal bowel gas pattern without free peritoneal air.  Right jugular catheter tip at the inferior aspect of the inferior vena cava.  Lower lumbar spine laminectomy defects and fixation hardware.  Cholecystectomy clips.  Marked left hip degenerative changes.  Right total hip prosthesis.  IMPRESSION:  1.  No acute abnormality. 2.  Stable cardiomegaly and mild chronic interstitial lung disease.   Original Report Authenticated By: Beckie Salts, M.D.     Assessment: 72 year old female with history of PUD and gastroparesis,  admitted with acute nausea, vomiting, diarrhea, hypokalemia. She has clinically improved, with absence of loose stools since admission and no further vomiting. Last EGD and colonoscopy Feb 2014 by Dr. Jena Gauss, with documentation of healed gastric ulcer.  Reglan contraindicated due to treatment failure in past (confusion). Stable from a GI standpoint for d/c.   Plan: PPI BID Zofran prn Avoid Reglan Follow peripherally Hopeful d/c in near future  Nira Retort, ANP-BC [facility]     LOS: 2 days    03/05/2013, 12:31 PM

## 2013-03-05 NOTE — Evaluation (Signed)
Physical Therapy Evaluation Patient Details Name: [name] MRN: [id number] DOB: Sep 21, 1941 Today's Date: 03/05/2013 Time: 1150-1230 PT Time Calculation (min): 40 min  PT Assessment / Plan / Recommendation Clinical Impression  Pt si seen for evaluation and found to be at prior functional level.  She is stable in gait with a quad cane.  No further PT should be needed.    PT Assessment  Patent does not need any further PT services    Follow Up Recommendations  No PT follow up    Does the patient have the potential to tolerate intense rehabilitation      Barriers to Discharge        Equipment Recommendations  None recommended by PT    Recommendations for Other Services     Frequency      Precautions / Restrictions Precautions Precautions: None Restrictions Weight Bearing Restrictions: No   Pertinent Vitals/Pain       Mobility  Bed Mobility Bed Mobility: Supine to Sit;Sit to Supine Supine to Sit: 7: Independent;HOB elevated Sit to Supine: Not Tested (comment) Transfers Transfers: Sit to Stand;Stand to Sit Sit to Stand: 6: Modified independent (Device/Increase time);From bed;Without upper extremity assist Stand to Sit: 6: Modified independent (Device/Increase time);Without upper extremity assist;To chair/3-in-1 Ambulation/Gait Ambulation/Gait Assistance: 6: Modified independent (Device/Increase time) Ambulation Distance (Feet): 250 Feet Assistive device: Large base quad cane Gait Pattern: Within Functional Limits Gait velocity: rapid at times Stairs: No Wheelchair Mobility Wheelchair Mobility: No    Exercises     PT Diagnosis:    PT Problem List:   PT Treatment Interventions:     PT Goals    Visit Information  Last PT Received On: 03/05/13    Subjective Data  Subjective: Is concerned about these bouts of nausea.vomiting and diarrhea Patient Stated Goal: return home   Prior Functioning  Home Living Lives With: Family Available Help at  Discharge: Family;Available 24 hours/day Type of Home: House Home Access: Ramped entrance Home Layout: One level Bathroom Shower/Tub: Tub/shower unit (takes a sponge bath) Bathroom Toilet: Standard Home Adaptive Equipment: Bedside commode/3-in-1;Walker - rolling;Quad cane;Wheelchair - manual Prior Function Level of Independence: Independent with assistive device(s) Able to Take Stairs?: No Driving: No 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 difficulties    Copywriter, advertising Overall Cognitive Status: Appears within functional limits for tasks assessed/performed Arousal/Alertness: Awake/alert Orientation Level: Appears intact for tasks assessed Behavior During Session: [facility] for tasks performed    Extremity/Trunk Assessment Right Lower Extremity Assessment RLE ROM/Strength/Tone: Within functional levels RLE Sensation: WFL - Light Touch RLE Coordination: WFL - gross motor Left Lower Extremity Assessment LLE ROM/Strength/Tone: Within functional levels LLE Sensation: WFL - Light Touch LLE Coordination: WFL - gross motor   Balance Balance Balance Assessed: No (WNL by functional observation)  End of Session PT - End of Session Equipment Utilized During Treatment: Gait belt Activity Tolerance: Patient tolerated treatment well Patient left: in chair;with chair alarm set Nurse Communication: Mobility status  GP     Konrad Penta 03/05/2013, 12:53 PM

## 2013-03-05 NOTE — Progress Notes (Signed)
Dr. Juanetta Gosling asked to hold off on inserting PICC because pt may be discharged in the morning and will not IV therapy at home.

## 2013-03-06 ENCOUNTER — Ambulatory Visit: Payer: PRIVATE HEALTH INSURANCE | Admitting: Orthopedic Surgery

## 2013-03-06 LAB — HEPATIC FUNCTION PANEL
ALT: 12 U/L (ref 0–35)
Albumin: 3.2 g/dL — ABNORMAL LOW (ref 3.5–5.2)
Total Protein: 6.3 g/dL (ref 6.0–8.3)

## 2013-03-06 LAB — BASIC METABOLIC PANEL
BUN: 22 mg/dL (ref 6–23)
CO2: 23 mEq/L (ref 19–32)
Creatinine, Ser: 1.57 mg/dL — ABNORMAL HIGH (ref 0.50–1.10)
GFR calc Af Amer: 37 mL/min — ABNORMAL LOW (ref >90)
Glucose, Bld: 102 mg/dL — ABNORMAL HIGH (ref 70–99)

## 2013-03-06 LAB — GLUCOSE, CAPILLARY: Glucose-Capillary: 95 mg/dL (ref 70–99)

## 2013-03-06 NOTE — Progress Notes (Signed)
Patient with orders to be discharge home. Discharge instructions given to patient, verbalized understanding. Patient in stable condition upon discharge. Patient left in private vehicle with daughter.

## 2013-03-06 NOTE — Progress Notes (Signed)
Subjective: She feels better. She's able to eat. She's been able to ambulate a little bit.  Objective: Vital signs in last 24 hours: Temp:  [97.3 F (36.3 C)-98.2 F (36.8 C)] 97.3 F (36.3 C) (04/02 0536) Pulse Rate:  [59-63] 63 (04/02 0536) Resp:  [18-20] 18 (04/02 0536) BP: (99-129)/(55-64) 129/63 mmHg (04/02 0536) SpO2:  [96 %-98 %] 98 % (04/02 0536) Weight change:  Last BM Date: 03/03/13  Intake/Output from previous day: 04/01 0701 - 04/02 0700 In: 1134 [P.O.:360; I.V.:774] Out: 600 [Urine:600]  PHYSICAL EXAM General appearance: alert, cooperative and no distress Resp: clear to auscultation bilaterally Cardio: regular rate and rhythm, S1, S2 normal, no murmur, click, rub or gallop GI: soft, non-tender; bowel sounds normal; no masses,  no organomegaly Extremities: extremities normal, atraumatic, no cyanosis or edema  Lab Results:    Basic Metabolic Panel:  Recent Labs  03/47/42 1440  03/05/13 0509 03/06/13 0451  NA 135  < > 137 136  K 2.9*  < > 3.1* 3.3*  CL 90*  < > 102 102  CO2 27  < > 24 23  GLUCOSE 133*  < > 106* 102*  BUN 17  < > 22 22  CREATININE 1.35*  < > 1.72* 1.57*  CALCIUM 10.2  < > 8.5 8.4  MG 1.3*  --   --   --   < > = values in this interval not displayed. Liver Function Tests:  Recent Labs  03/05/13 0509 03/06/13 0451  AST 22 17  ALT 14 12  ALKPHOS 80 79  BILITOT 0.3 0.2*  PROT 6.4 6.3  ALBUMIN 3.3* 3.2*    Recent Labs  03/05/13 0509 03/06/13 0451  LIPASE 42 54   No results found for this basename: AMMONIA,  in the last 72 hours CBC:  Recent Labs  03/03/13 1440  WBC 15.5*  NEUTROABS 12.5*  HGB 11.1*  HCT 33.1*  MCV 83.2  PLT 235   Cardiac Enzymes: No results found for this basename: CKTOTAL, CKMB, CKMBINDEX, TROPONINI,  in the last 72 hours BNP: No results found for this basename: PROBNP,  in the last 72 hours D-Dimer: No results found for this basename: DDIMER,  in the last 72 hours CBG:  Recent Labs  03/04/13 2016 03/05/13 0731 03/05/13 1131 03/05/13 1657 03/05/13 2056 03/06/13 0727  GLUCAP 111* 105* 110* 104* 125* 95   Hemoglobin A1C: No results found for this basename: HGBA1C,  in the last 72 hours Fasting Lipid Panel: No results found for this basename: CHOL, HDL, LDLCALC, TRIG, CHOLHDL, LDLDIRECT,  in the last 72 hours Thyroid Function Tests:  Recent Labs  03/04/13 0503  TSH 1.086   Anemia Panel: No results found for this basename: VITAMINB12, FOLATE, FERRITIN, TIBC, IRON, RETICCTPCT,  in the last 72 hours Coagulation: No results found for this basename: LABPROT, INR,  in the last 72 hours Urine Drug Screen: Drugs of Abuse  No results found for this basename: labopia, cocainscrnur, labbenz, amphetmu, thcu, labbarb    Alcohol Level: No results found for this basename: ETH,  in the last 72 hours Urinalysis:  Recent Labs  03/04/13 1456  COLORURINE YELLOW  LABSPEC 1.015  PHURINE 6.0  GLUCOSEU NEGATIVE  HGBUR NEGATIVE  BILIRUBINUR NEGATIVE  KETONESUR NEGATIVE  PROTEINUR TRACE*  UROBILINOGEN 0.2  NITRITE NEGATIVE  LEUKOCYTESUR NEGATIVE   Misc. Labs:  ABGS No results found for this basename: PHART, PCO2, PO2ART, TCO2, HCO3,  in the last 72 hours CULTURES No results found for this  or any previous visit (from the past 240 hour(s)). Studies/Results: No results found.  Medications:  Prior to Admission:  Prescriptions prior to admission  Medication Sig Dispense Refill  . amLODipine (NORVASC) 10 MG tablet Take 10 mg by mouth daily.      Marland Kitchen aspirin EC 81 MG tablet Take 81 mg by mouth daily.      Marland Kitchen atorvastatin (LIPITOR) 40 MG tablet TAKE ONE TABLET BY MOUTH EVERY DAY AT 6PM  30 tablet  5  . Bromfenac Sodium (BROMDAY) 0.09 % SOLN Place 1 drop into the right eye 3 (three) times daily.      Marland Kitchen doxepin (SINEQUAN) 25 MG capsule Take 25 mg by mouth at bedtime.      Marland Kitchen estradiol (ESTRACE) 0.5 MG tablet Take 0.5 mg by mouth daily.        . fentaNYL (DURAGESIC - DOSED  MCG/HR) 100 MCG/HR Place 1 patch onto the skin every 3 (three) days.      . hydrALAZINE (APRESOLINE) 25 MG tablet Take 25 mg by mouth every 8 (eight) hours.      . magnesium hydroxide (MILK OF MAGNESIA) 400 MG/5ML suspension Takes couple of tablespoons daily.      . metFORMIN (GLUCOPHAGE) 500 MG tablet Take 500 mg by mouth 2 (two) times daily with a meal.       . metoprolol tartrate (LOPRESSOR) 25 MG tablet Take 1 tablet (25 mg total) by mouth 2 (two) times daily.  30 tablet  6  . nitroGLYCERIN (NITROSTAT) 0.4 MG SL tablet Place 0.4 mg under the tongue every 5 (five) minutes as needed for chest pain.       Marland Kitchen ondansetron (ZOFRAN) 8 MG tablet Take 8 mg by mouth every 8 (eight) hours as needed for nausea.       . ondansetron (ZOFRAN-ODT) 8 MG disintegrating tablet Take 8 mg by mouth every 8 (eight) hours as needed. Nausea and Vomiting      . pantoprazole (PROTONIX) 40 MG tablet Take 1 tablet (40 mg total) by mouth 2 (two) times daily.  60 tablet  11  . phenytoin (DILANTIN) 100 MG ER capsule Take 300 mg by mouth at bedtime.        . potassium chloride (K-DUR) 10 MEQ tablet Take 2 tablets (20 mEq total) by mouth daily.  60 tablet  3  . promethazine (PHENERGAN) 25 MG tablet Take 1 tablet (25 mg total) by mouth every 6 (six) hours as needed for nausea.  15 tablet  0  . senna-docusate (SENOKOT-S) 8.6-50 MG per tablet Take 1 tablet by mouth daily as needed for constipation.        Scheduled: . acidophilus  1 capsule Oral Daily  . amLODipine  10 mg Oral Daily  . aspirin EC  81 mg Oral Daily  . atorvastatin  20 mg Oral QPC supper  . Bromfenac Sodium  1 drop Right Eye TID  . doxepin  25 mg Oral QHS  . enoxaparin (LOVENOX) injection  40 mg Subcutaneous Q24H  . estradiol  0.5 mg Oral Daily  . feeding supplement  1 Container Oral TID BM  . fentaNYL  100 mcg Transdermal Q72H  . hydrALAZINE  25 mg Oral Q8H  . metoprolol tartrate  25 mg Oral BID  . pantoprazole  40 mg Oral BID AC  . phenytoin  300 mg Oral  QHS  . potassium chloride  20 mEq Oral Daily  . sodium chloride  10-40 mL Intracatheter Q12H   Continuous: .  sodium chloride 75 mL/hr at 03/05/13 1132   HQI:[id number], magnesium hydroxide, nitroGLYCERIN, ondansetron, promethazine, sodium chloride  Assesment: She is much improved from her nausea vomiting and diarrhea. Her other medical problems are stable and she's ready for discharge Principal Problem:   Noninfectious gastroenteritis and colitis Active Problems:   Gastroparesis   Chronic diastolic heart failure   Diabetes mellitus, type II   Hypertension   Arteriosclerotic cardiovascular disease (ASCVD)   Gastroesophageal reflux disease   Obesity   COPD (chronic obstructive pulmonary disease)   Seizure disorder   PUD (peptic ulcer disease)   Dehydration    Plan: Discharge home with home health services    LOS: 3 days   Yessika Otte L 03/06/2013, 8:59 AM

## 2013-03-06 NOTE — Progress Notes (Signed)
ANTICOAGULATION CONSULT NOTE   Pharmacy Consult for Lovenox Indication: VTE prophylaxis  Allergies  Allergen Reactions  . Codeine     "good" headache  . Other     Leafy raw vegetables and seeds  . Oxycodone Hcl     "good" headache; also OxyContin  . Reglan (Metoclopramide)     CONFUSION   Patient Measurements: Height: 5\' 2"  (157.5 cm) Weight: 194 lb 3.6 oz (88.1 kg) IBW/kg (Calculated) : 50.1  Vital Signs: Temp: 97.3 F (36.3 C) (04/02 0536) Temp src: Oral (04/02 0536) BP: 129/63 mmHg (04/02 0536) Pulse Rate: 63 (04/02 0536)  Labs:  Recent Labs  03/03/13 1440 03/04/13 0503 03/05/13 0509 03/06/13 0451  HGB 11.1*  --   --   --   HCT 33.1*  --   --   --   PLT 235  --   --   --   CREATININE 1.35* 1.27* 1.72* 1.57*   Estimated Creatinine Clearance: 32.9 ml/min (by C-G formula based on Cr of 1.57).  Medical History: Past Medical History  Diagnosis Date  . Diabetes mellitus, type II   . Hypertension   . Arteriosclerotic cardiovascular disease (ASCVD)     multivessel, CABG 6/10.  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  Myoview (11/20/11) showed no ischemia & EF 62%  . Gastroesophageal reflux disease     Hiatal hernia  . Hyperlipidemia   . Deep vein thrombosis, upper right extremity     post-PPM  . Obesity   . DDD (degenerative disc disease)   . Dehiscence of closure of sternum or sternotomy 07/2009    sternal infection; required flap closure  . COPD (chronic obstructive pulmonary disease)     severe lung disease by PFTs 6/12  . Sleep apnea   . Seizure disorder   . 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 acidophilus  1 capsule Oral Daily  . amLODipine  10 mg Oral Daily  . aspirin EC  81 mg Oral Daily  . atorvastatin  20 mg Oral QPC supper  . Bromfenac Sodium  1 drop Right Eye TID  . doxepin  25 mg Oral QHS  . enoxaparin (LOVENOX) injection  40 mg Subcutaneous Q24H  . estradiol  0.5 mg Oral Daily  .  feeding supplement  1 Container Oral TID BM  . fentaNYL  100 mcg Transdermal Q72H  . hydrALAZINE  25 mg Oral Q8H  . metoprolol tartrate  25 mg Oral BID  . pantoprazole  40 mg Oral BID AC  . phenytoin  300 mg Oral QHS  . potassium chloride  20 mEq Oral Daily  . sodium chloride  10-40 mL Intracatheter Q12H  . [DISCONTINUED] metFORMIN  500 mg Oral BID WC   Assessment: 71yo female with clcr > 30.  Estimated Creatinine Clearance: 32.9 ml/min (by C-G formula based on Cr of 1.57).  Goal of Therapy:  VTE prophylaxis Monitor platelets by anticoagulation protocol: Yes   Plan:  Lovenox 40mg  SQ q24hrs Pharmacy will sign off  Valrie Hart A 03/06/2013,9:32 AM

## 2013-03-07 NOTE — Progress Notes (Signed)
Physician Discharge Summary  Patient ID: [name] MRN: [id number] DOB/AGE: 74-Jun-1942 74 y.o. Primary Care Physician:Christine Hayworth L, MD Admit date: 03/03/2013 Discharge date: 03/07/2013    Discharge Diagnoses:   Principal Problem:   Noninfectious gastroenteritis and colitis Active Problems:   Gastroparesis   Chronic diastolic heart failure   Diabetes mellitus, type II   Hypertension   Arteriosclerotic cardiovascular disease (ASCVD)   Gastroesophageal reflux disease   Obesity   COPD (chronic obstructive pulmonary disease)   Seizure disorder   PUD (peptic ulcer disease)   Dehydration     Medication List    TAKE these medications       amLODipine 10 MG tablet  Commonly known as:  NORVASC  Take 10 mg by mouth daily.     aspirin EC 81 MG tablet  Take 81 mg by mouth daily.     atorvastatin 40 MG tablet  Commonly known as:  LIPITOR  TAKE ONE TABLET BY MOUTH EVERY DAY AT 6PM     BROMDAY 0.09 % Soln  Generic drug:  Bromfenac Sodium  Place 1 drop into the right eye 3 (three) times daily.     doxepin 25 MG capsule  Commonly known as:  SINEQUAN  Take 25 mg by mouth at bedtime.     estradiol 0.5 MG tablet  Commonly known as:  ESTRACE  Take 0.5 mg by mouth daily.     fentaNYL 100 MCG/HR  Commonly known as:  DURAGESIC - dosed mcg/hr  Place 1 patch onto the skin every 3 (three) days.     hydrALAZINE 25 MG tablet  Commonly known as:  APRESOLINE  Take 25 mg by mouth every 8 (eight) hours.     magnesium hydroxide 400 MG/5ML suspension  Commonly known as:  MILK OF MAGNESIA  Takes couple of tablespoons daily.     metFORMIN 500 MG tablet  Commonly known as:  GLUCOPHAGE  Take 500 mg by mouth 2 (two) times daily with a meal.     metoprolol tartrate 25 MG tablet  Commonly known as:  LOPRESSOR  Take 1 tablet (25 mg total) by mouth 2 (two) times daily.     nitroGLYCERIN 0.4 MG SL tablet  Commonly known as:  NITROSTAT  Place 0.4 mg under the tongue every 5 (five)  minutes as needed for chest pain.     ondansetron 8 MG disintegrating tablet  Commonly known as:  ZOFRAN-ODT  Take 8 mg by mouth every 8 (eight) hours as needed. Nausea and Vomiting     pantoprazole 40 MG tablet  Commonly known as:  PROTONIX  Take 1 tablet (40 mg total) by mouth 2 (two) times daily.     phenytoin 100 MG ER capsule  Commonly known as:  DILANTIN  Take 300 mg by mouth at bedtime.     potassium chloride 10 MEQ tablet  Commonly known as:  K-DUR  Take 2 tablets (20 mEq total) by mouth daily.     promethazine 25 MG tablet  Commonly known as:  PHENERGAN  Take 1 tablet (25 mg total) by mouth every 6 (six) hours as needed for nausea.     senna-docusate 8.6-50 MG per tablet  Commonly known as:  Senokot-S  Take 1 tablet by mouth daily as needed for constipation.     ZOFRAN 8 MG tablet  Generic drug:  ondansetron  Take 8 mg by mouth every 8 (eight) hours as needed for nausea.        Discharged Condition: Improved  Consults: Gastroenterology  Significant Diagnostic Studies: Dg Abd Acute W/chest  03/03/2013  *RADIOLOGY REPORT*  Clinical Data: Abdominal pain, weakness and shortness of breath.  ACUTE ABDOMEN SERIES (ABDOMEN 2 VIEW & CHEST 1 VIEW)  Comparison: Previous examinations.  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  Stable mildly prominent interstitial markings.  Normal bowel gas pattern without free peritoneal air.  Right jugular catheter tip at the inferior aspect of the inferior vena cava.  Lower lumbar spine laminectomy defects and fixation hardware.  Cholecystectomy clips.  Marked left hip degenerative changes.  Right total hip prosthesis.  IMPRESSION:  1.  No acute abnormality. 2.  Stable cardiomegaly and mild chronic interstitial lung disease.   Original Report Authenticated By: [name], M.D.     Lab Results: Basic Metabolic Panel:  Recent Labs  16/10/96 0509 03/06/13 0451  NA 137 136   K 3.1* 3.3*  CL 102 102  CO2 24 23  GLUCOSE 106* 102*  BUN 22 22  CREATININE 1.72* 1.57*  CALCIUM 8.5 8.4   Liver Function Tests:  Recent Labs  03/05/13 0509 03/06/13 0451  AST 22 17  ALT 14 12  ALKPHOS 80 79  BILITOT 0.3 0.2*  PROT 6.4 6.3  ALBUMIN 3.3* 3.2*     CBC: No results found for this basename: WBC, NEUTROABS, HGB, HCT, MCV, PLT,  in the last 72 hours  No results found for this or any previous visit (from the past 240 hour(s)).   Hospital Course: She came to the hospital because of nausea vomiting and diarrhea. This is a recurrent problem. She has multiple medical problems as listed. She appear to be dehydrated on admission. She was started on IV fluids given anti-emetics and had GI consultation. It was felt that at least some of the problem was related to a noninfectious gastroenteritis and that some of it was related to her known gastroparesis. She is intolerant of Reglan. She improved fairly rapidly and was back to baseline by the time of discharge. She was able to tolerate regular food had no more nausea vomiting or diarrhea.  Discharge Exam: Blood pressure 125/68, pulse 58, temperature 98.2 F (36.8 C), temperature source Oral, resp. rate 18, height 5\' 2"  (1.575 m), weight 88.1 kg (194 lb 3.6 oz), SpO2 98.00%. She is awake and alert. She is morbidly obese. Her chest is clear. Her abdomen is soft.  Disposition: Home with home health services      Discharge Orders   Future Appointments Provider Department Dept Phone   05/23/2013 1:30 PM De [facility] [phone]   Future Orders Complete By Expires     Discharge patient  As directed     Face-to-face encounter (required for Medicare/Medicaid patients)  As directed     Comments:      I [name] certify that this patient is under my care and that I, or a nurse practitioner or physician's assistant working with me, had a face-to-face encounter that meets the  physician face-to-face encounter requirements with this patient on 03/06/2013. The encounter with the patient was in whole, or in part for the following medical condition(s) which is the primary reason for home health care (List medical condition): Recurrent nausea vomiting diarrhea/gastroparesis    Questions:      The encounter with the patient was in whole, or in part, for the following medical condition, which is the primary reason for home health care:  Recurrent nausea vomiting diarrhea/gastroparesis  I certify that, based on my findings, the following services are medically necessary home health services:  Nursing    Physical therapy    My clinical findings support the need for the above services:  Shortness of breath with activity    Further, I certify that my clinical findings support that this patient is homebound due to:  Ambulates short distances less than 300 feet    Reason for Medically Necessary Home Health Services:  Skilled Nursing- Change/Decline in Patient Status    Home Health  As directed     Questions:      To provide the following care/treatments:  PT    RN       Follow-up Information   Follow up with [facility].   Contact information:   [address] [place] [address] [phone]      Signed: Fredirick Maudlin Pager [phone]  03/07/2013, 7:57 AM

## 2013-04-08 ENCOUNTER — Other Ambulatory Visit (HOSPITAL_COMMUNITY): Payer: Self-pay | Admitting: Nurse Practitioner

## 2013-04-15 ENCOUNTER — Telehealth: Payer: Self-pay | Admitting: Internal Medicine

## 2013-04-15 NOTE — Telephone Encounter (Signed)
Spoke with Fleet Contras from [facility] who requested patient's last EF from ECHO.  I reported last ECHO was 2/11 and EF was 65-70%.

## 2013-04-15 NOTE — Telephone Encounter (Signed)
New Prob     Requesting EF from pt last ECHO. Will fax over request. ast

## 2013-05-01 ENCOUNTER — Ambulatory Visit: Payer: PRIVATE HEALTH INSURANCE | Admitting: Orthopedic Surgery

## 2013-05-02 ENCOUNTER — Telehealth: Payer: Self-pay | Admitting: *Deleted

## 2013-05-02 NOTE — Telephone Encounter (Signed)
PT STATES THAT A LADY NAMED RACHEL FROM [facility] STATES THAT SHE IS SUPPOSE TO BE ON BENAZEPRIL. PATIENT IS CALLING TO SEE IF THIS IS CORRECT.

## 2013-05-03 NOTE — Telephone Encounter (Signed)
Called pt to advise this medication is not listed on current list, was not listed or started on last OV in our office 01-11-13, nor did I see in the D/C summary from ED, not sure where this medication came in per possible order from PCP per [facility], unable to leave pt a message per mailbox is full, will call again

## 2013-05-07 NOTE — Telephone Encounter (Signed)
Called pt to advise that this medication is not listed in her chart, pt advised that she did not think she was on this medication but the nurse Fleet Contras seemed to think the pt hip pain came from the HTN and that the benazepril will help lower the BP and assist with the pain, advised pt this is not a normal prescription, pt confirmed that is what the nurse told her, noted pt pending for apt with ortho for hip pain, called [facility] and spoke with rep brenda whom advised a notation from Sharlene Motts on 05-02-13 was given number for Sharlene Motts as [phone] ext [phone] called ext times 3 and call ended all three times, will try call again later

## 2013-05-08 ENCOUNTER — Telehealth: Payer: Self-pay | Admitting: *Deleted

## 2013-05-09 NOTE — Telephone Encounter (Signed)
Called RB with no contact made, will try to contact once again

## 2013-05-16 IMAGING — CR DG CHEST 2V
2 series · 2 of 2 positions shown · non-contrast
Comparison: 05/24/2011

CLINICAL DATA: Chest pain

CHEST - 2 VIEW

[w chest pa]
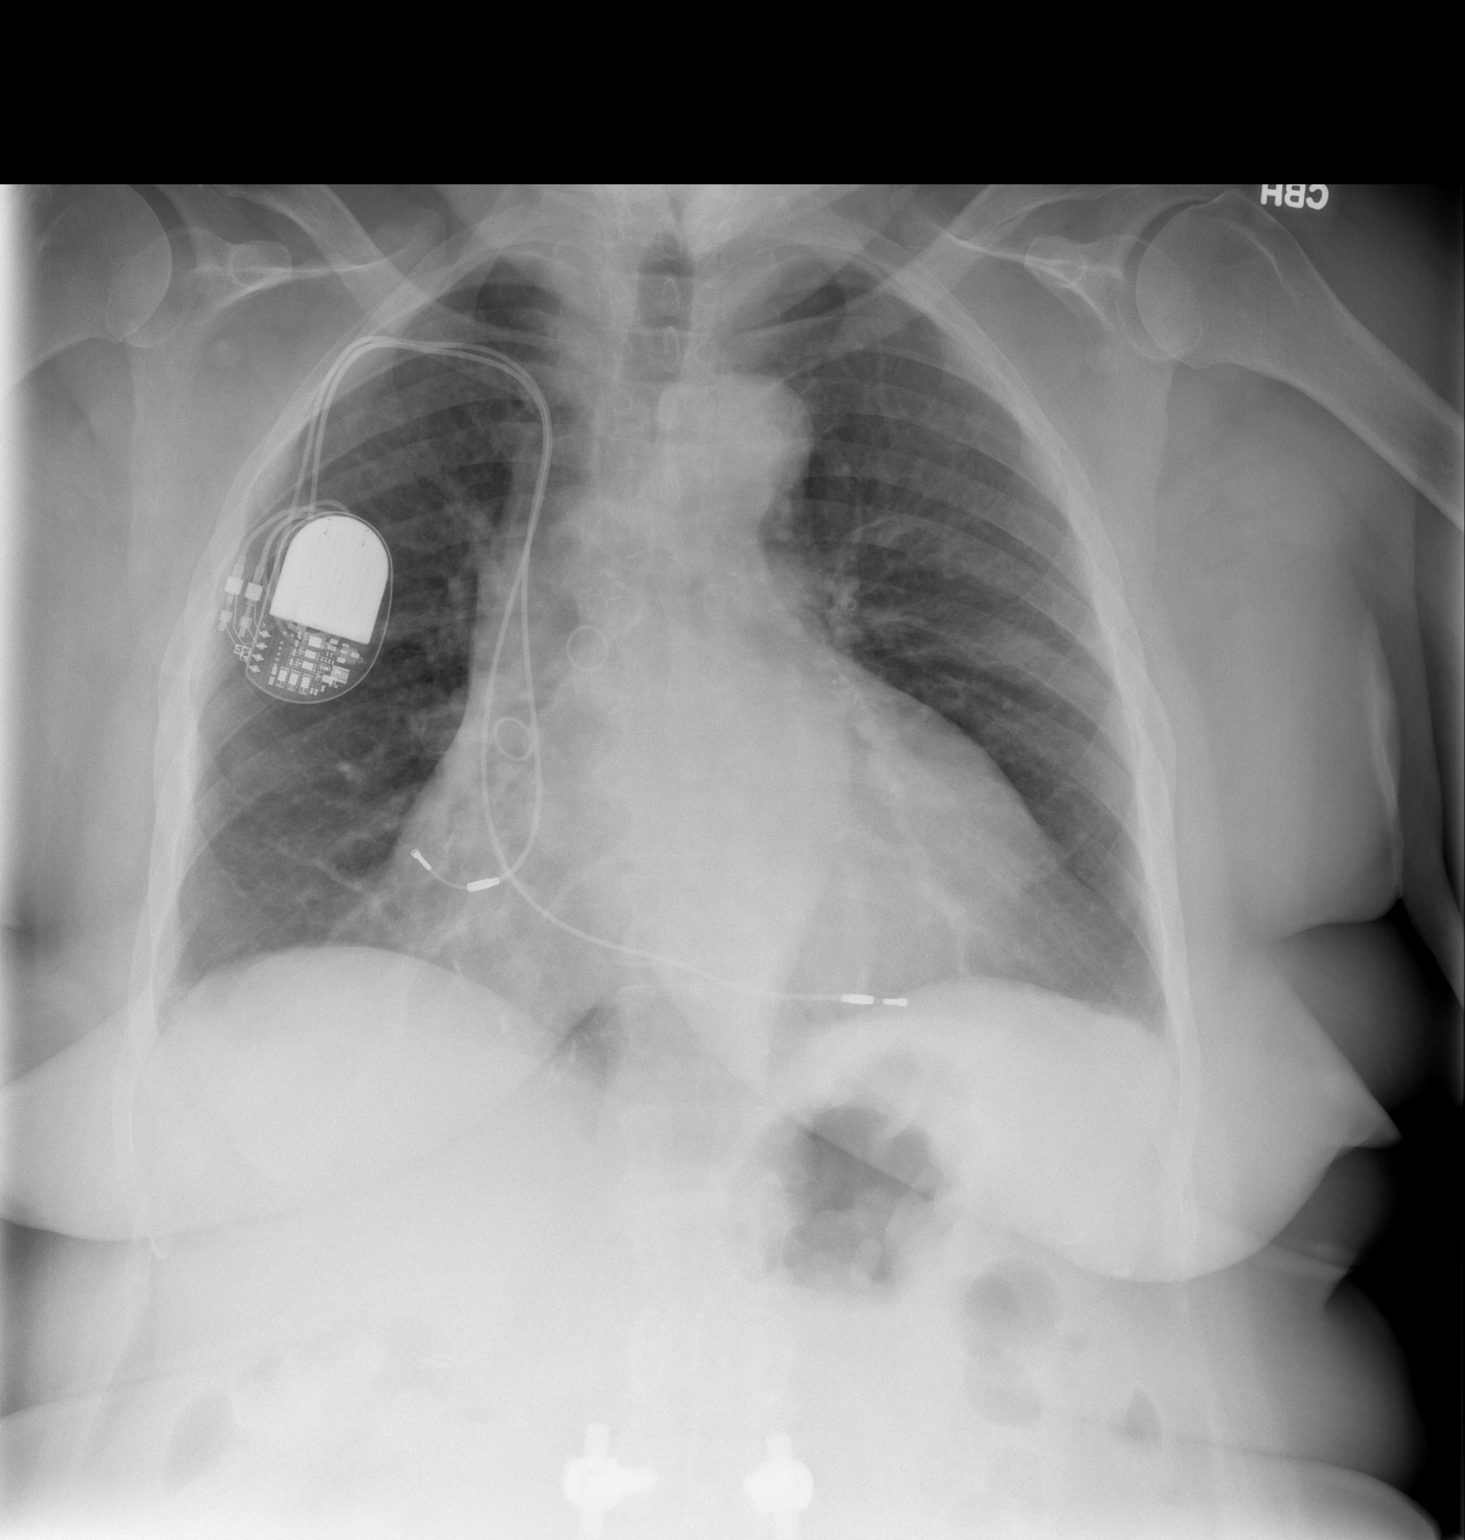

[w chest lat]
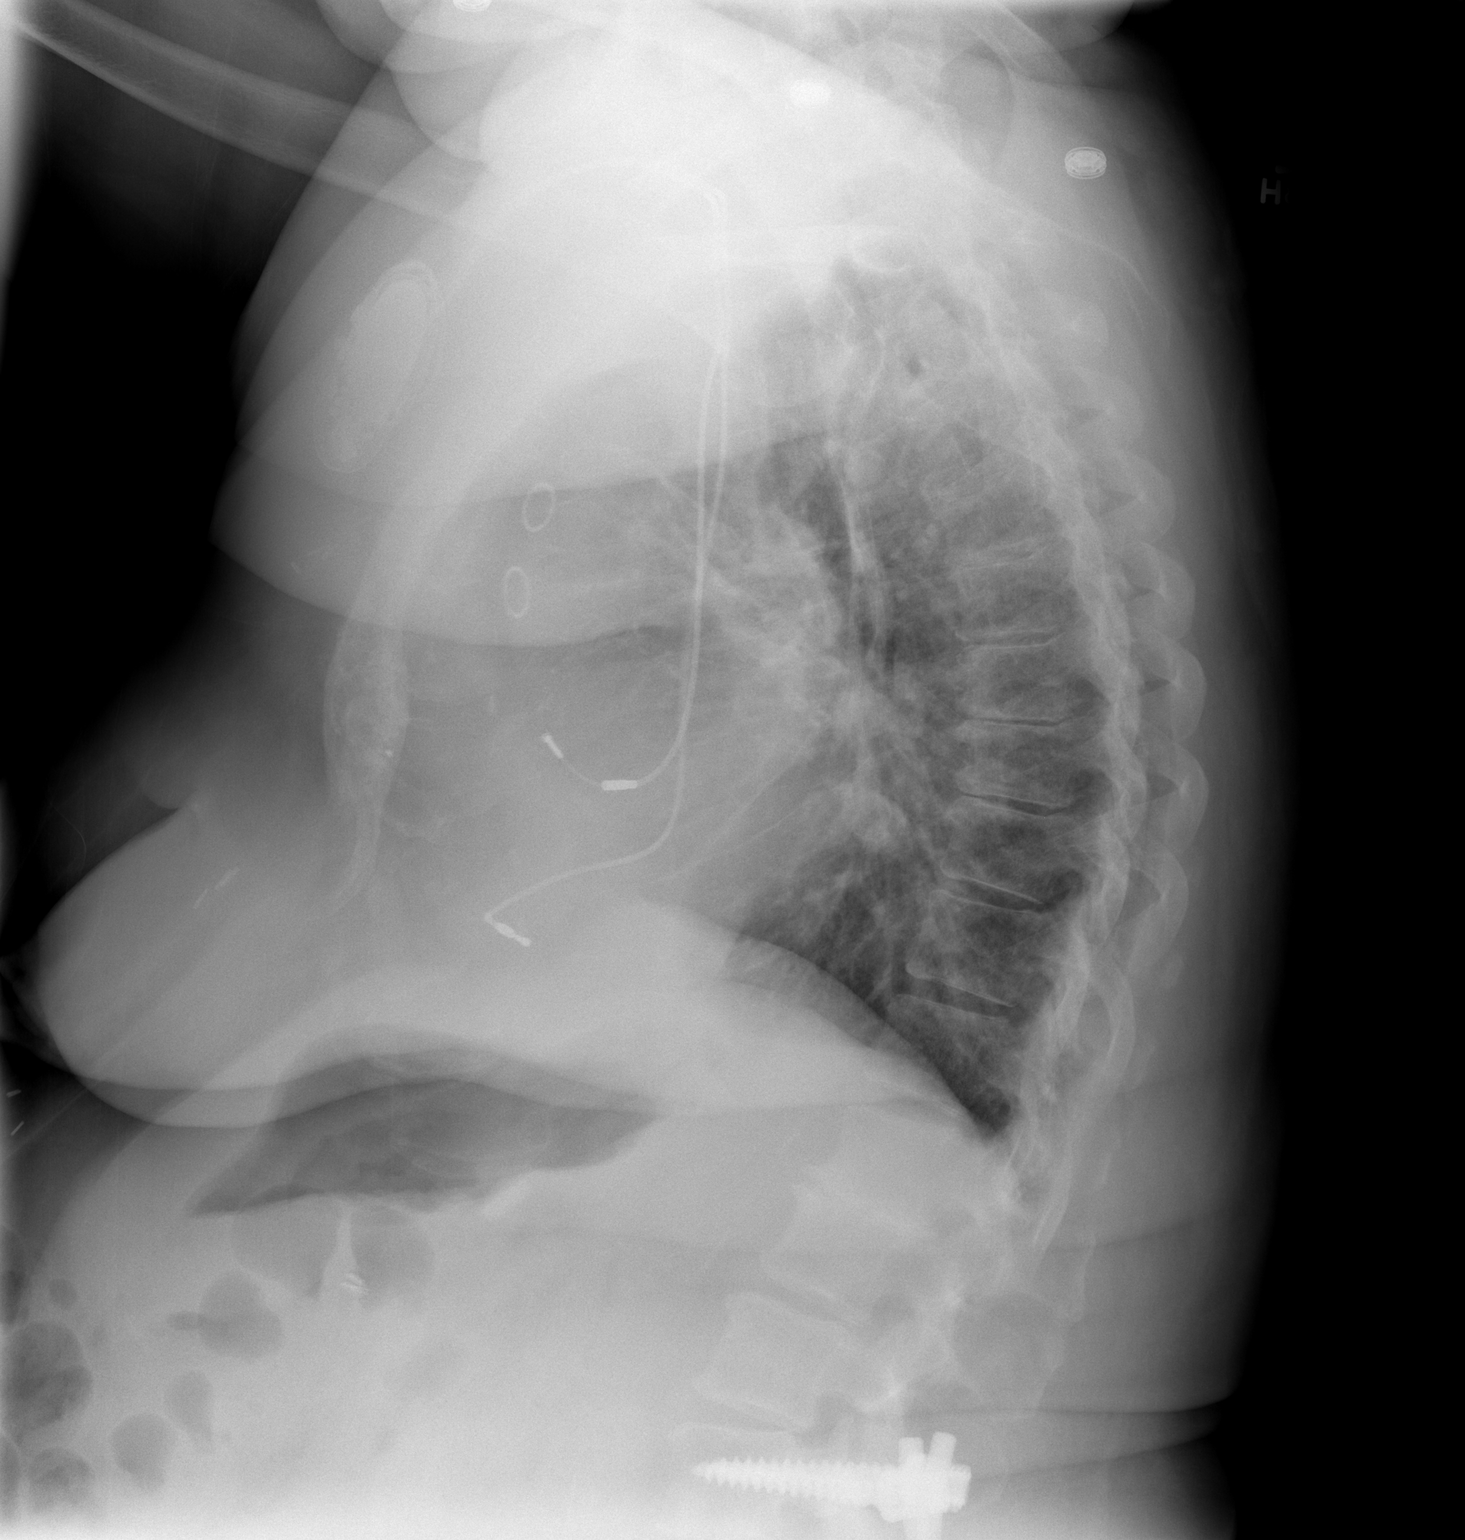

[2 of 2 positions shown; findings below may reference images not displayed]

FINDINGS: There is a right chest wall pacer device with lead in the
right atrial appendage and right ventricle.

The heart size appears moderately enlarged.

 There are no pleural effusions or pulmonary edema identified.

There is no airspace consolidation identified.
IMPRESSION: 1.  Cardiac enlargement
2.  No heart failure.

## 2013-05-21 NOTE — Telephone Encounter (Signed)
Called RB with [employer] again, left another message for her to call this office, per noted this pt has not been on benazepril of any kind since d/c at discharge 11-21-11, pt has not been placed on this medication from any cardiologist with our practice, also noted pt PCP discharged pt from ED on 04-03-13 with no instructions to take this medication, pt was also clarified from last OV with this practice as not taking benazepril nor advised to start taking per [facility] NP OV noted 01-11-13, advised pt to have RB from [facility] to call our office with any further questions, pt will advise per notes RB has not contacted her either,advised pt that from our standpoint she is not advised to take this medication, she can call her PCP to see if they are advising to do so without her knowledge per pt notes PCP has not advised her to take this medication at all

## 2013-05-22 ENCOUNTER — Encounter: Payer: Self-pay | Admitting: Orthopedic Surgery

## 2013-05-22 ENCOUNTER — Ambulatory Visit (INDEPENDENT_AMBULATORY_CARE_PROVIDER_SITE_OTHER): Payer: Medicaid Other | Admitting: Orthopedic Surgery

## 2013-05-22 VITALS — BP 144/88 | Ht 62.0 in | Wt 194.0 lb

## 2013-05-22 DIAGNOSIS — Z96649 Presence of unspecified artificial hip joint: Secondary | ICD-10-CM

## 2013-05-22 DIAGNOSIS — M169 Osteoarthritis of hip, unspecified: Secondary | ICD-10-CM

## 2013-05-22 DIAGNOSIS — Z96659 Presence of unspecified artificial knee joint: Secondary | ICD-10-CM | POA: Insufficient documentation

## 2013-05-22 DIAGNOSIS — Z96653 Presence of artificial knee joint, bilateral: Secondary | ICD-10-CM

## 2013-05-22 DIAGNOSIS — M1611 Unilateral primary osteoarthritis, right hip: Secondary | ICD-10-CM

## 2013-05-22 DIAGNOSIS — M48062 Spinal stenosis, lumbar region with neurogenic claudication: Secondary | ICD-10-CM

## 2013-05-22 DIAGNOSIS — M1610 Unilateral primary osteoarthritis, unspecified hip: Secondary | ICD-10-CM

## 2013-05-22 DIAGNOSIS — M1612 Unilateral primary osteoarthritis, left hip: Secondary | ICD-10-CM | POA: Insufficient documentation

## 2013-05-22 MED ORDER — TRAMADOL-ACETAMINOPHEN 37.5-325 MG PO TABS: 1.0000 | ORAL_TABLET | ORAL | Status: DC | PRN

## 2013-05-22 NOTE — Progress Notes (Signed)
Patient ID: [name], female   DOB: [birth year], 72 y.o.   MRN: [id number] Chief Complaint  Patient presents with  . Pain    Left Hip and Leg Pain. Referred by Dr. Juanetta Gosling    This is a 71 year old female with multiple medical problems and multiple previous surgeries. Past Medical History  Diagnosis Date  . Diabetes mellitus, type II   . Hypertension   . Arteriosclerotic cardiovascular disease (ASCVD)     multivessel, CABG 6/10.  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  Myoview (11/20/11) showed no ischemia & EF 62%  . Gastroesophageal reflux disease     Hiatal hernia  . Hyperlipidemia   . Deep vein thrombosis, upper right extremity     post-PPM  . Obesity   . DDD (degenerative disc disease)   . Dehiscence of closure of sternum or sternotomy 07/2009    sternal infection; required flap closure  . COPD (chronic obstructive pulmonary disease)     severe lung disease by PFTs 6/12  . Sleep apnea   . Seizure disorder   . 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 Pacemaker placement  1993    Status post DDD pacemaker implantation in 1993, with Medtronic Kappa generator change in 2003 for  second-degree AV block, most recent gen change by Fawn Kirk 04/14/11  . Revision total hip arthroplasty    . Replacement total knee      Left  . Thyroid surgery    . Wrist surgery    . 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 Reconstructive repair sternal      for infection s/p CABG 8/10  . Back surgery    . Esophagogastroduodenoscopy      with [facility] dilation,  biopsy, and disruption Schatzki's ring  . Colonoscopy  04/27/2005    hemorrhoids, diverticula  . Esophagogastroduodenoscopy  09/10/2012    RMR: Noncritical Schatzki's ring. Diffuse gastric erosions and an  area of partially healing ulceration-status post biopsy/ Small hiatal hernia. Bx reactive gastropathy.  . Abdominal  hysterectomy    . Colonoscopy with esophagogastroduodenoscopy (egd) N/A 02/20/2013    RMR: pancolonic diverticulosis and internal hemorrhoids. EGD with small hiatal hernia, healed gastric ulcer    She comes in today complaining of left groin pain and anterior thigh pain along with lower back pain and bilateral leg pain radiating in the posterior thighs associated with numbness and tingling of both legs, decreasing function in terms of ambulation. She is using a cane she's not on any medication for her current symptoms. She had the lumbar fusion in 2002.  Review of systems she complains of per blurred vision and eye redness with watering, occasional chest pain, tightness of the chest with snoring. She does have a pacemaker. She has nausea vomiting constipation diarrhea listed as well as excessive urination. She had seizure history long time ago no recent seizures in the last 5 years she denied weight loss frequency changes of the skin nervousness bleeding or adverse reaction to food or seasonal allergy  She does report allergic to codeine and oxycodone a headache which is more reaction than an actual allergy  BP 144/88  Ht 5\' 2"  (1.575 m)  Wt 194 lb (87.998 kg)  BMI 35.47 kg/m2 General and hygiene are normal. Development and nutrition are normal. Body habitus endomorphic  Mood Affect are  normal The patient is alert and oriented x3 Ambulatory status supported by a cane   RUE (include skin) Inspection reveals no tenderness or swelling,  range of motion is normal. The joints are located without subluxation or laxity. Motor exam reveals grade 5 strength and the skin is without rash lesion or ulcer  LUE Inspection reveals no tenderness or swelling,  range of motion is normal. The joints are located without subluxation or laxity. Motor exam reveals grade 5 strength;  skin is without rash lesion or ulcer  RLE Inspection reveals no tenderness or swelling;  range of motion right knee 115 and right  hip 120 flexion . The joints are located without subluxation or laxity. Motor exam reveals grade 5 strength and the skin is without rash lesion or ulcer  LLE Inspection reveals no tenderness or swelling,  range of motion is 110 left knee and abnormal in the left hip with pain with flexion ( 100 )/ and internal rotation , limited to 15.  The joints are located without subluxation or laxity. Motor exam reveals grade 5 strength and  the skin is without rash lesion or ulcer  CDV peripheral pulses are intact with mild  swelling  LYMPH are normal in all 4 extremities with no palpable nodes  DTR are blunted 0-1 and balance is suspect   Several x-rays have been done the patient she does have osteoarthritis of left hip shows a right total knee in place. She also has bilateral total knees and she has a lumbar spine fusion with surrounding spinal stenosis  IMPRESSION:  . No acute fracture or subluxation. Significant narrowing of  superior joint space left hip joint. Right hip prosthesis anatomic  alignment.   T11-T12: Small calcified central disc protrusion, otherwise  negative.  T12-L1: Chronic severe disc space loss with endplate sclerosis and  circumferential endplate spurring. Occasional gas containing  Schmorl's nodes. No significant spinal stenosis. Mild left  greater than right T12 foraminal stenosis primarily due to endplate  spurring.  L1-L2: Chronic disc space loss. New vacuum disc phenomena.  Chronic circumferential disc osteophyte complex eccentric to the  right has not significantly changed. No significant spinal  stenosis.  L2-L3: Chronic disc space loss and vacuum disc phenomena have  increased. Bulky circumferential disc osteophyte complex. Chronic  facet hypertrophy and ligament flavum hypertrophy has increased and  is severe. Subsequent spinal stenosis appears mildly increased and  is severe. Mild to moderate bilateral foraminal stenosis may be  mildly increased.  L3-L4:  Sequelae of decompression and fusion with transpedicular  and interbody hardware. Hardware is stable. The solid posterior  element arthrodesis. Less pronounced interbody ossification. No  evidence of spinal or foraminal stenosis.  L4-L5: Sequelae of decompression and fusion with transpedicular  and interbody hardware. Hardware stable and intact. Stable grade  1 anterolisthesis at this level. Solid posterior element  arthrodesis. Solid posterior interbody arthrodesis. Stable spinal  canal and neural foramina. There may be mild right lateral recess  and right foraminal stenosis as before.  L5-S1: Chronic circumferential disc bulge. Chronic severe facet  degeneration. Decreased vacuum phenomena in the facets. Stable  ligament flavum hypertrophy. No significant spinal or lateral  recess stenosis. No significant foraminal stenosis.  IMPRESSION:  1. Stable postoperative appearance of L3-L4 and L4-L5 with solid  mostly posterior element arthrodesis.  2. Adjacent segment disease at L2-L3 is progressed now with severe  multifactorial spinal stenosis.  3. Other lumbar levels are stable since 2011.  . Encounter Diagnoses  Name Primary?  Marland Kitchen  Osteoarthritis of right hip   . Osteoarthritis of left hip Yes  . H/O total hip arthroplasty   . Total knee replacement status, bilateral   . Spinal stenosis, lumbar region, with neurogenic claudication     Assessment the patient is not a candidate for surgery at this time based on her medical problems and coronary artery disease which includes congestive heart failure. If she can be medically optimized she may be a candidate at a tertiary care facility. I think she will also need further workup for her lumbar spine spinal stenosis with referral back to Dr. Newell Coral who did her spine fusion

## 2013-05-23 ENCOUNTER — Ambulatory Visit: Payer: PRIVATE HEALTH INSURANCE | Admitting: Gastroenterology

## 2013-05-23 ENCOUNTER — Telehealth: Payer: Self-pay | Admitting: Gastroenterology

## 2013-05-23 NOTE — Telephone Encounter (Signed)
Patient was a no-show 

## 2013-06-05 ENCOUNTER — Other Ambulatory Visit: Payer: Self-pay | Admitting: *Deleted

## 2013-06-05 ENCOUNTER — Telehealth: Payer: Self-pay | Admitting: *Deleted

## 2013-06-05 DIAGNOSIS — M1612 Unilateral primary osteoarthritis, left hip: Secondary | ICD-10-CM

## 2013-06-05 NOTE — Telephone Encounter (Signed)
Faxed referral and office notes to [facility] for consultation hip replacement and spinal stenosis.Awaiting appointment.

## 2013-06-09 IMAGING — CR DG HIP (WITH OR WITHOUT PELVIS) 2-3V*L*
3 series · 3 of 3 positions shown · non-contrast
Comparison: None.

CLINICAL DATA: Low back pain, left hip pain

LEFT HIP - COMPLETE 2+ VIEW

[view not recorded (1 of 3)]
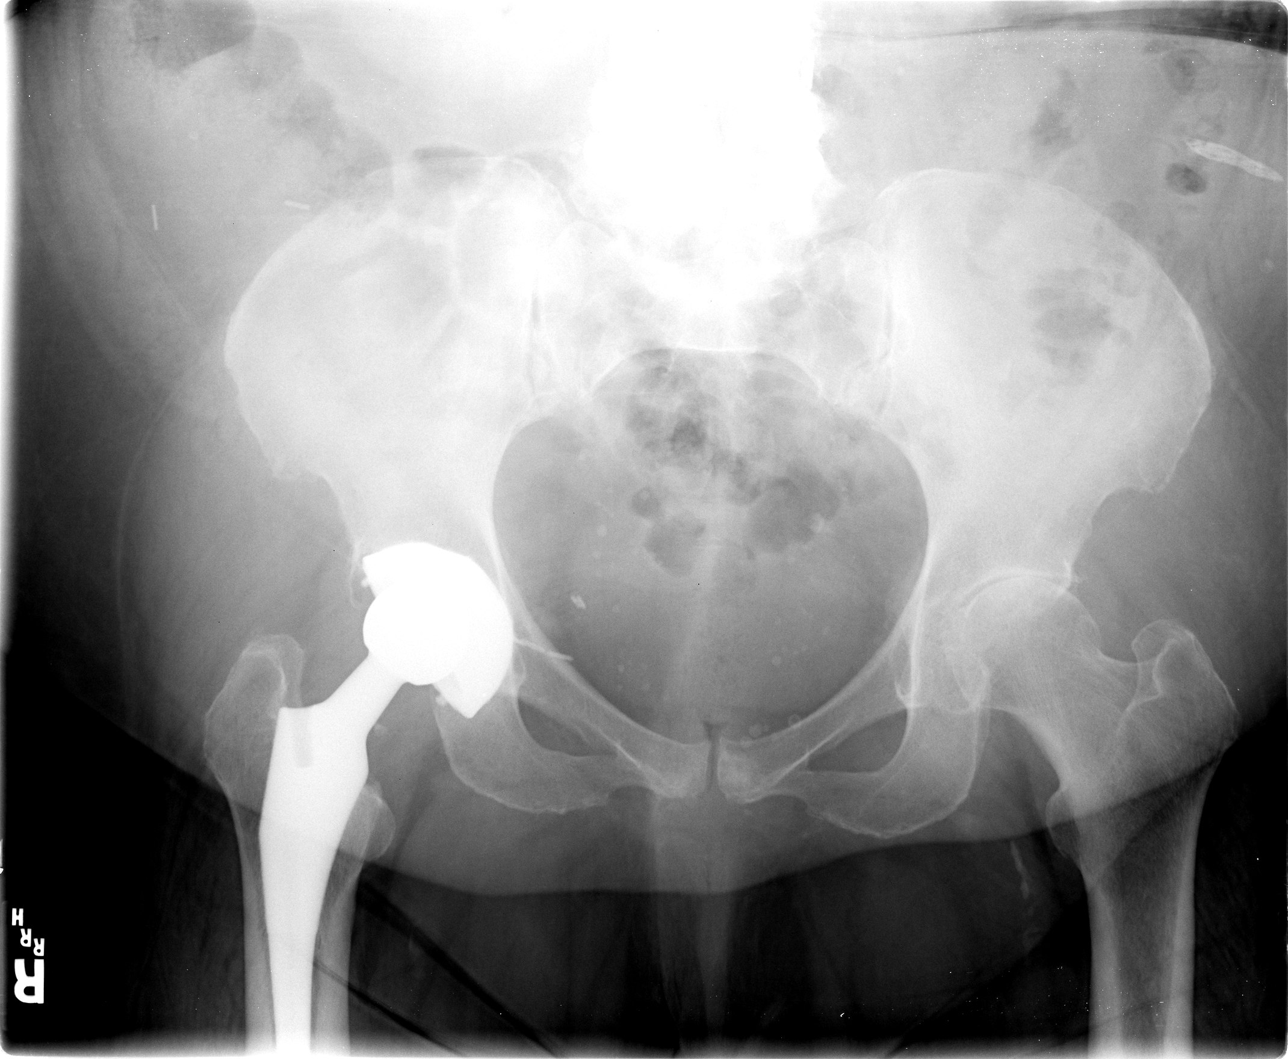

[view not recorded (2 of 3)]
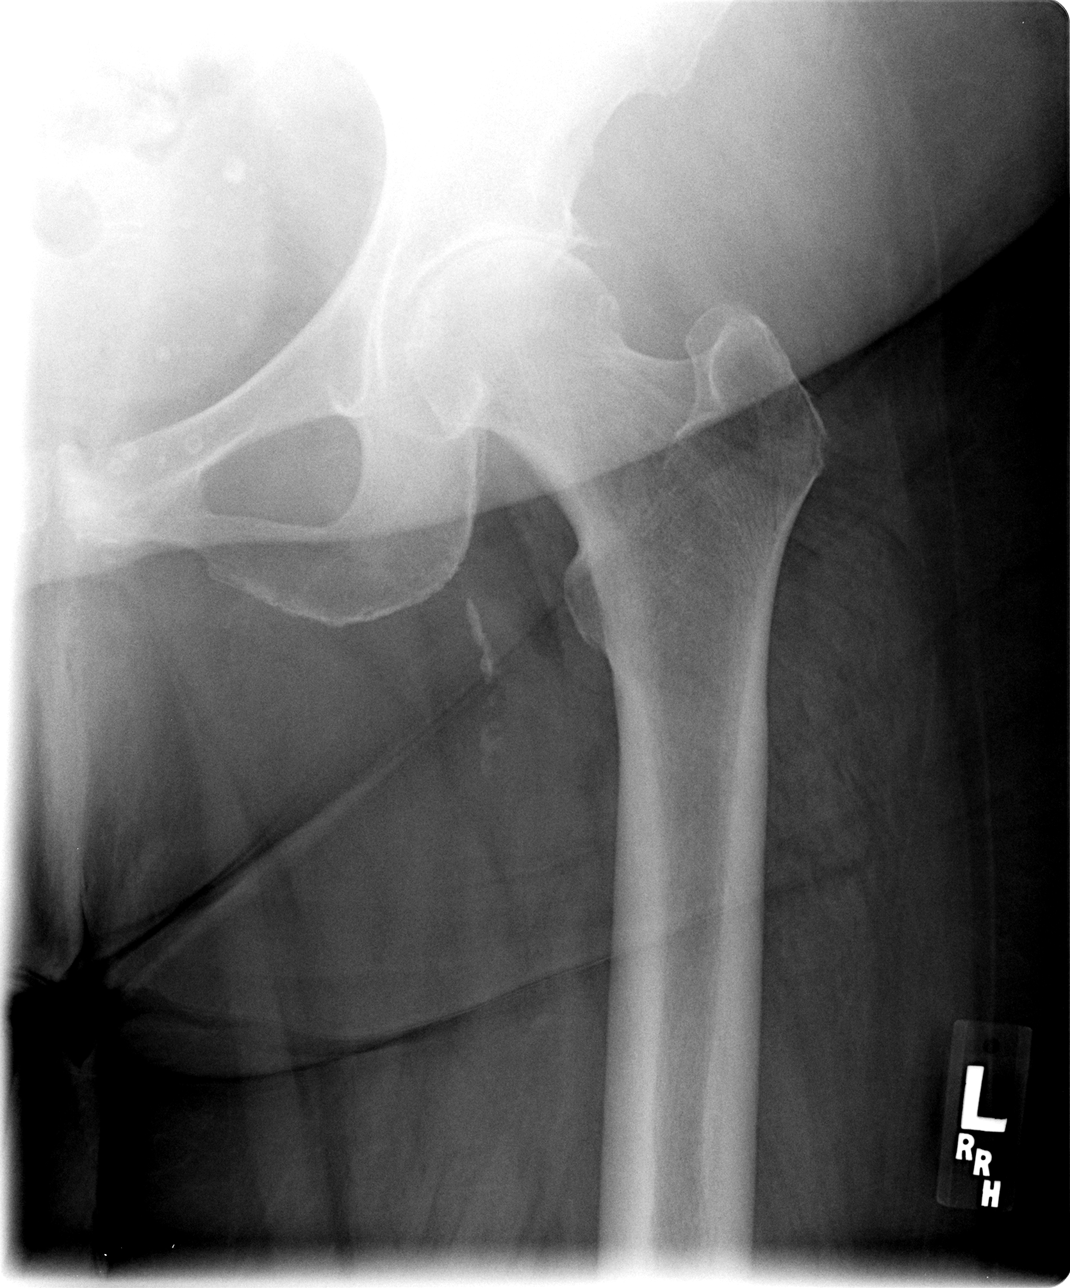

[view not recorded (3 of 3)]
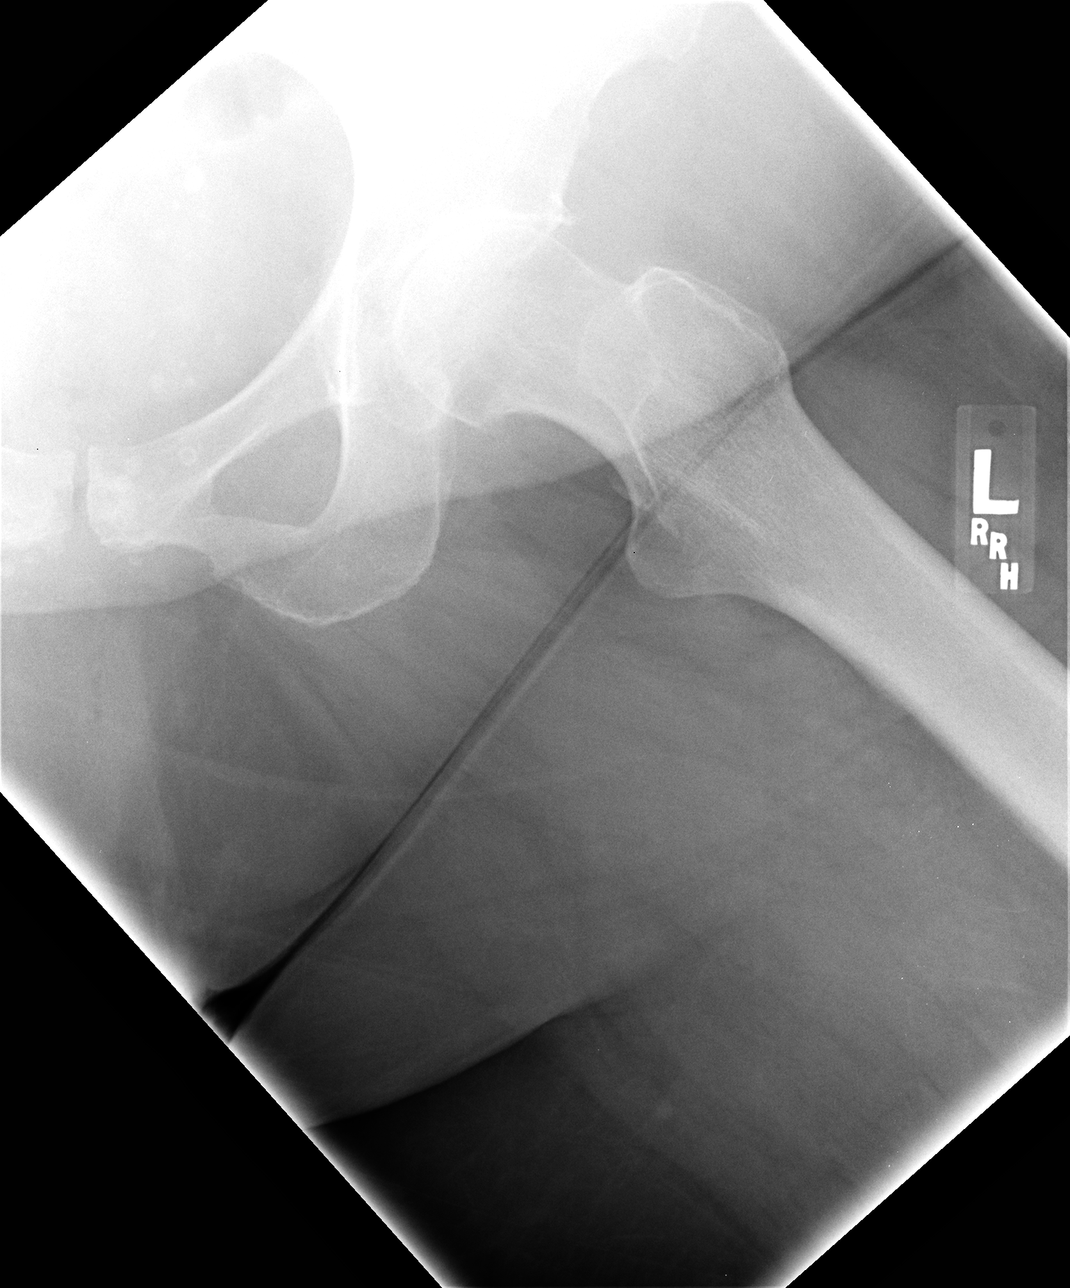

[3 of 3 positions shown; findings below may reference images not displayed]

FINDINGS: Three views of the left hip submitted.  No left hip acute
fracture or subluxation.  There is a right hip prosthesis anatomic
alignment.  Degenerative changes are noted left hip joint with
significant narrowing of superior joint space.  Mild spurring of
the superior acetabulum. Postoperative changes are noted lumbar
spine.  Multiple pelvic phleboliths are noted.
IMPRESSION: .  No acute fracture or subluxation.  Significant narrowing of
superior joint space left hip joint.  Right hip prosthesis anatomic
alignment.

## 2013-06-10 NOTE — Telephone Encounter (Signed)
Appointment with Dr. Shon Baton 07/08/13 at 9:15 am and appointment with Dr. Charlann Boxer 07/19/13 at 9:90 am. Patient is aware.

## 2013-06-28 ENCOUNTER — Inpatient Hospital Stay (HOSPITAL_COMMUNITY)
Admission: EM | Admit: 2013-06-28 | Discharge: 2013-07-03 | DRG: 070 | Disposition: A | Discharge status: Home Health | Payer: PRIVATE HEALTH INSURANCE | Attending: Pulmonary Disease | Admitting: Pulmonary Disease

## 2013-06-28 ENCOUNTER — Emergency Department (HOSPITAL_COMMUNITY): Payer: PRIVATE HEALTH INSURANCE

## 2013-06-28 ENCOUNTER — Encounter (HOSPITAL_COMMUNITY): Payer: Self-pay

## 2013-06-28 DIAGNOSIS — R404 Transient alteration of awareness: Secondary | ICD-10-CM

## 2013-06-28 DIAGNOSIS — K5641 Fecal impaction: Secondary | ICD-10-CM | POA: Diagnosis present

## 2013-06-28 DIAGNOSIS — N390 Urinary tract infection, site not specified: Secondary | ICD-10-CM | POA: Diagnosis present

## 2013-06-28 DIAGNOSIS — D649 Anemia, unspecified: Secondary | ICD-10-CM

## 2013-06-28 DIAGNOSIS — R410 Disorientation, unspecified: Secondary | ICD-10-CM

## 2013-06-28 DIAGNOSIS — Z950 Presence of cardiac pacemaker: Secondary | ICD-10-CM

## 2013-06-28 DIAGNOSIS — Z86718 Personal history of other venous thrombosis and embolism: Secondary | ICD-10-CM

## 2013-06-28 DIAGNOSIS — Z8711 Personal history of peptic ulcer disease: Secondary | ICD-10-CM

## 2013-06-28 DIAGNOSIS — I10 Essential (primary) hypertension: Secondary | ICD-10-CM | POA: Diagnosis present

## 2013-06-28 DIAGNOSIS — Z7982 Long term (current) use of aspirin: Secondary | ICD-10-CM

## 2013-06-28 DIAGNOSIS — E1149 Type 2 diabetes mellitus with other diabetic neurological complication: Secondary | ICD-10-CM | POA: Diagnosis present

## 2013-06-28 DIAGNOSIS — K219 Gastro-esophageal reflux disease without esophagitis: Secondary | ICD-10-CM | POA: Diagnosis present

## 2013-06-28 DIAGNOSIS — E876 Hypokalemia: Secondary | ICD-10-CM | POA: Diagnosis not present

## 2013-06-28 DIAGNOSIS — Z951 Presence of aortocoronary bypass graft: Secondary | ICD-10-CM

## 2013-06-28 DIAGNOSIS — I2510 Atherosclerotic heart disease of native coronary artery without angina pectoris: Secondary | ICD-10-CM | POA: Diagnosis present

## 2013-06-28 DIAGNOSIS — I951 Orthostatic hypotension: Secondary | ICD-10-CM | POA: Diagnosis not present

## 2013-06-28 DIAGNOSIS — G40909 Epilepsy, unspecified, not intractable, without status epilepticus: Secondary | ICD-10-CM | POA: Diagnosis present

## 2013-06-28 DIAGNOSIS — Z96649 Presence of unspecified artificial hip joint: Secondary | ICD-10-CM

## 2013-06-28 DIAGNOSIS — J4489 Other specified chronic obstructive pulmonary disease: Secondary | ICD-10-CM | POA: Diagnosis present

## 2013-06-28 DIAGNOSIS — K5900 Constipation, unspecified: Secondary | ICD-10-CM | POA: Diagnosis present

## 2013-06-28 DIAGNOSIS — M1612 Unilateral primary osteoarthritis, left hip: Secondary | ICD-10-CM | POA: Diagnosis present

## 2013-06-28 DIAGNOSIS — I214 Non-ST elevation (NSTEMI) myocardial infarction: Secondary | ICD-10-CM | POA: Diagnosis not present

## 2013-06-28 DIAGNOSIS — I252 Old myocardial infarction: Secondary | ICD-10-CM

## 2013-06-28 DIAGNOSIS — E669 Obesity, unspecified: Secondary | ICD-10-CM | POA: Diagnosis present

## 2013-06-28 DIAGNOSIS — M1610 Unilateral primary osteoarthritis, unspecified hip: Secondary | ICD-10-CM | POA: Diagnosis present

## 2013-06-28 DIAGNOSIS — I509 Heart failure, unspecified: Secondary | ICD-10-CM | POA: Diagnosis present

## 2013-06-28 DIAGNOSIS — Z79899 Other long term (current) drug therapy: Secondary | ICD-10-CM

## 2013-06-28 DIAGNOSIS — E119 Type 2 diabetes mellitus without complications: Secondary | ICD-10-CM | POA: Diagnosis present

## 2013-06-28 DIAGNOSIS — I5032 Chronic diastolic (congestive) heart failure: Secondary | ICD-10-CM | POA: Diagnosis present

## 2013-06-28 DIAGNOSIS — M169 Osteoarthritis of hip, unspecified: Secondary | ICD-10-CM | POA: Diagnosis present

## 2013-06-28 DIAGNOSIS — G9340 Encephalopathy, unspecified: Principal | ICD-10-CM | POA: Diagnosis present

## 2013-06-28 DIAGNOSIS — E785 Hyperlipidemia, unspecified: Secondary | ICD-10-CM | POA: Diagnosis present

## 2013-06-28 DIAGNOSIS — J449 Chronic obstructive pulmonary disease, unspecified: Secondary | ICD-10-CM | POA: Diagnosis present

## 2013-06-28 DIAGNOSIS — G4730 Sleep apnea, unspecified: Secondary | ICD-10-CM | POA: Diagnosis present

## 2013-06-28 DIAGNOSIS — G8190 Hemiplegia, unspecified affecting unspecified side: Secondary | ICD-10-CM | POA: Diagnosis present

## 2013-06-28 DIAGNOSIS — K3184 Gastroparesis: Secondary | ICD-10-CM | POA: Diagnosis present

## 2013-06-28 LAB — COMPREHENSIVE METABOLIC PANEL
ALT: 21 U/L (ref 0–35)
AST: 28 U/L (ref 0–37)
Albumin: 4.2 g/dL (ref 3.5–5.2)
Calcium: 10.5 mg/dL (ref 8.4–10.5)
Sodium: 137 mEq/L (ref 135–145)
Total Protein: 8.2 g/dL (ref 6.0–8.3)

## 2013-06-28 LAB — CBC WITH DIFFERENTIAL/PLATELET
Basophils Absolute: 0 10*3/uL (ref 0.0–0.1)
Eosinophils Absolute: 0.1 10*3/uL (ref 0.0–0.7)
Eosinophils Relative: 2 % (ref 0–5)
MCH: 28.6 pg (ref 26.0–34.0)
MCV: 86.3 fL (ref 78.0–100.0)
Platelets: 233 10*3/uL (ref 150–400)
RDW: 14.6 % (ref 11.5–15.5)
WBC: 8.7 10*3/uL (ref 4.0–10.5)

## 2013-06-28 LAB — URINE MICROSCOPIC-ADD ON

## 2013-06-28 LAB — URINALYSIS, ROUTINE W REFLEX MICROSCOPIC
Leukocytes, UA: NEGATIVE
Nitrite: NEGATIVE
Protein, ur: NEGATIVE mg/dL
Urobilinogen, UA: 0.2 mg/dL (ref 0.0–1.0)

## 2013-06-28 LAB — LIPASE, BLOOD: Lipase: 58 U/L (ref 11–59)

## 2013-06-28 MED ORDER — ONDANSETRON HCL 4 MG/2ML IJ SOLN
4.0000 mg | Freq: Once | INTRAMUSCULAR | Status: AC
  Administered 2013-06-28: 4 mg via INTRAVENOUS
  Filled 2013-06-28: qty 2

## 2013-06-28 MED ORDER — SODIUM CHLORIDE 0.9 % IV BOLUS (SEPSIS)
1000.0000 mL | Freq: Once | INTRAVENOUS | Status: AC
  Administered 2013-06-28: 1000 mL via INTRAVENOUS

## 2013-06-28 NOTE — ED Provider Notes (Addendum)
CSN: [id number]     Arrival date & time 06/28/13  1338 History  This chart was scribed for Enid Skeens, MD by [name], ED Scribe. This patient was seen in room APA06/APA06 and the patient's care was started at 2:44 PM.   First MD Initiated Contact with Patient 06/28/13 1404     Chief Complaint  Patient presents with  . Constipation   Level 5 Caveat- AMS  The history is provided by the patient and a relative. No language interpreter was used.    HPI Comments: [name] is a 72 y.o. female who presents to the Emergency Department complaining of one week of decreased appetite with associated epigastric abdominal pain described as "hard". Son-in-law reports confusion and constipation described as small, hard stools for the past week as well. He also reports one episode of urinary incontinence last night. He denies that the pt has been seen by her PCP since the symptoms' onset. Son-in-law denies prior CVA, seizures or UTI history. When questioned, pt names objects and knows name, birthday and hospital but does not know the city. She answers "Yes" to all associated symptom questions. She denies any prior abdominal surgeries or prior epsidoes. She has a h/o DM, HTN and COPD.   Pt lives with daughter and son-in-law.  Past Medical History  Diagnosis Date  . Diabetes mellitus, type II   . Hypertension   . Arteriosclerotic cardiovascular disease (ASCVD)     multivessel, CABG 6/10.  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  Myoview (11/20/11) showed no ischemia & EF 62%  . Gastroesophageal reflux disease     Hiatal hernia  . Hyperlipidemia   . Deep vein thrombosis, upper right extremity     post-PPM  . Obesity   . DDD (degenerative disc disease)   . Dehiscence of closure of sternum or sternotomy 07/2009    sternal infection; required flap closure  . COPD (chronic obstructive pulmonary disease)     severe lung disease by PFTs 6/12  . Sleep apnea   . Seizure disorder   . 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 Pacemaker placement  1993    Status post DDD pacemaker implantation in 1993, with Medtronic Kappa generator change in 2003 for  second-degree AV block, most recent gen change by Fawn Kirk 04/14/11  . Revision total hip arthroplasty    . Replacement total knee      Left  . Thyroid surgery    . Wrist surgery    . 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 Reconstructive repair sternal      for infection s/p CABG 8/10  . Back surgery    . Esophagogastroduodenoscopy      with [facility] dilation,  biopsy, and disruption Schatzki's ring  . Colonoscopy  04/27/2005    hemorrhoids, diverticula  . Esophagogastroduodenoscopy  09/10/2012    RMR: Noncritical Schatzki's ring. Diffuse gastric erosions and an  area of partially healing ulceration-status post biopsy/ Small hiatal hernia. Bx reactive gastropathy.  . Abdominal hysterectomy    . Colonoscopy with esophagogastroduodenoscopy (egd) N/A 02/20/2013    RMR: pancolonic diverticulosis and internal hemorrhoids. EGD with small hiatal hernia, healed gastric ulcer   Family History  Problem Relation Age of Onset  . Diabetes    . Heart disease Mother  MI  . Coronary artery disease      Female <55  . Cancer    . Colon cancer Father     age 28   History  Substance Use Topics  . Smoking status: Never Smoker   . Smokeless tobacco: Never Used  . Alcohol Use: No   No OB history provided.  Review of Systems  Unable to perform ROS: Mental status change    Allergies  Codeine; Other; Oxycodone hcl; and Reglan  Home Medications   Current Outpatient Rx  Name  Route  Sig  Dispense  Refill  . amLODipine (NORVASC) 10 MG tablet   Oral   Take 10 mg by mouth daily.         Marland Kitchen aspirin EC 81 MG tablet   Oral   Take 81 mg by mouth daily.         Marland Kitchen atorvastatin (LIPITOR) 40 MG tablet      TAKE  ONE TABLET BY MOUTH EVERY DAY AT 6PM   30 tablet   5   . Bromfenac Sodium (BROMDAY) 0.09 % SOLN   Right Eye   Place 1 drop into the right eye 3 (three) times daily.         Marland Kitchen doxepin (SINEQUAN) 25 MG capsule   Oral   Take 25 mg by mouth at bedtime.         Marland Kitchen estradiol (ESTRACE) 0.5 MG tablet   Oral   Take 0.5 mg by mouth daily.           . fentaNYL (DURAGESIC - DOSED MCG/HR) 100 MCG/HR   Transdermal   Place 1 patch onto the skin every 3 (three) days.         . hydrALAZINE (APRESOLINE) 25 MG tablet   Oral   Take 25 mg by mouth every 8 (eight) hours.         . magnesium hydroxide (MILK OF MAGNESIA) 400 MG/5ML suspension      Takes couple of tablespoons daily.         . metFORMIN (GLUCOPHAGE) 500 MG tablet   Oral   Take 500 mg by mouth 2 (two) times daily with a meal.          . metoprolol tartrate (LOPRESSOR) 25 MG tablet   Oral   Take 1 tablet (25 mg total) by mouth 2 (two) times daily.   30 tablet   6   . metoprolol tartrate (LOPRESSOR) 25 MG tablet      TAKE ONE-HALF TABLET BY MOUTH TWICE DAILY   30 tablet   3   . nitroGLYCERIN (NITROSTAT) 0.4 MG SL tablet   Sublingual   Place 0.4 mg under the tongue every 5 (five) minutes as needed for chest pain.          Marland Kitchen ondansetron (ZOFRAN) 8 MG tablet   Oral   Take 8 mg by mouth every 8 (eight) hours as needed for nausea.          . ondansetron (ZOFRAN-ODT) 8 MG disintegrating tablet   Oral   Take 8 mg by mouth every 8 (eight) hours as needed. Nausea and Vomiting         . pantoprazole (PROTONIX) 40 MG tablet   Oral   Take 1 tablet (40 mg total) by mouth 2 (two) times daily.   60 tablet   11   . phenytoin (DILANTIN) 100 MG ER capsule   Oral   Take 300 mg by mouth at bedtime.           Marland Kitchen  potassium chloride (K-DUR) 10 MEQ tablet   Oral   Take 2 tablets (20 mEq total) by mouth daily.   60 tablet   3   . promethazine (PHENERGAN) 25 MG tablet   Oral   Take 1 tablet (25 mg total) by  mouth every 6 (six) hours as needed for nausea.   15 tablet   0   . senna-docusate (SENOKOT-S) 8.6-50 MG per tablet   Oral   Take 1 tablet by mouth daily as needed for constipation.          . traMADol-acetaminophen (ULTRACET) 37.5-325 MG per tablet   Oral   Take 1 tablet by mouth every 4 (four) hours as needed for pain.   90 tablet   5    Triage Vitals: BP 175/71  Pulse 98  Temp(Src) 98.2 F (36.8 C) (Oral)  Resp 20  Ht 5\' 2"  (1.575 m)  Wt 200 lb (90.719 kg)  BMI 36.57 kg/m2  SpO2 96%  Physical Exam  Nursing note and vitals reviewed. Constitutional: She appears well-developed and well-nourished. No distress.  HENT:  Head: Normocephalic and atraumatic.  Eyes: Conjunctivae and EOM are normal. Pupils are equal, round, and reactive to light.  Neck: Neck supple. No tracheal deviation present.  Cardiovascular: Normal rate and regular rhythm.   Murmur heard.  Systolic murmur is present with a grade of 2/6  Pulses:      Dorsalis pedis pulses are 2+ on the right side, and 2+ on the left side.  Right upper sternal border  Pulmonary/Chest: Effort normal and breath sounds normal. No respiratory distress. She has no wheezes.  Abdominal: Soft. She exhibits no distension. There is no tenderness.  Active bowel sounds  Musculoskeletal: Normal range of motion. She exhibits edema (very mild bilateral ankle swelling).  No focal pain  Lymphadenopathy:    She has no cervical adenopathy.  Neurological: She is alert. No cranial nerve deficit.  Pt names objects and knows her name, birthday and the hospital but does not know the city, follows commands, no obvious arm drift, can lift left leg up against gravity,+ weakness on LLE, 3 to 4 + weakness on RLE, generalized weakness in the upper extremities, can feel me touching but mixes up left and right, pt can look right and left, no obvious facial droop but pt will not smile   Skin: Skin is warm and dry. No rash noted.  Psychiatric: Her  behavior is normal.    ED Course   Procedures (including critical care time)  Medications  sodium chloride 0.9 % bolus 1,000 mL (not administered)  ondansetron (ZOFRAN) injection 4 mg (not administered)    DIAGNOSTIC STUDIES: Oxygen Saturation is 96% on room air, normal by my interpretation.    COORDINATION OF CARE: 2:51 PM-Discussed treatment plan which includes IV fluids, antiemetic, CT of head, xray of the abdomen, CBC panel, CMP and UA with pt and son-in-law at bedside and both agreed to plan.   Labs Reviewed - No data to display No results found. No diagnosis found.  MDM  I personally performed the services described in this documentation, which was scribed in my presence. The recorded information has been reviewed and is accurate.  Confusion, general weakness, abdominal pain. Workup in ED unremarkable for cause.  Family no longer in ED, discussed on arrival that they are not able to care for her at home as she is worsening. Rechecks showed mild confusion, no meningismus. EKG reviewed for confusion  Date: 08/08/2013  Rate: 63  Rhythm: normal sinus rhythm  QRS Axis: normal  Intervals: normal  ST/T Wave abnormalities: normal  Conduction Disutrbances:none  Narrative Interpretation:   Old EKG Reviewed: unchanged   Suspect pt needs NH.  Spoke with hospitalist, admitted. Vitals stable in ED  Ct Head Wo Contrast  06/28/2013   *RADIOLOGY REPORT*  Clinical Data: Mental status change, history hypertension, diabetes, seizures, COPD, vascular disease  CT HEAD WITHOUT CONTRAST  Technique:  Contiguous axial images were obtained from the base of the skull through the vertex without contrast.  Comparison: None  Findings: Generalized atrophy. Normal ventricular morphology. No midline shift or mass effect. Small vessel chronic ischemic changes of deep cerebral white matter. No intracranial hemorrhage, mass lesion, or evidence of acute infarction. No extra-axial fluid collections.  Atherosclerotic calcifications at skull base. No acute bone or sinus abnormality.  IMPRESSION: Atrophy with small vessel chronic ischemic changes of deep cerebral white matter. No acute intracranial abnormalities.   Original Report Authenticated By: Ulyses Southward, M.D.   Ct Abdomen Pelvis W Contrast  06/29/2013   *RADIOLOGY REPORT*  Clinical Data: Abdominal pain.  Confusion.  Decreased appetite and epigastric pain.  Constipation.  CT ABDOMEN AND PELVIS WITH CONTRAST  Technique:  Multidetector CT imaging of the abdomen and pelvis was performed following the standard protocol during bolus administration of intravenous contrast.  Contrast: 50mL OMNIPAQUE IOHEXOL 300 MG/ML  SOLN, OMNIPAQUE IOHEXOL 300 MG/ML  SOLN  Comparison: 09/16/2012  Findings: Atelectasis or fibrosis in the lung bases.  Cardiac enlargement.  Surgical absence of the gallbladder.  Small esophageal hiatal hernia.  Gastric wall is not thickened.  Small bowel are not abnormally distended.  No bowel wall thickening.  Stool filled colon without distension or wall thickening.  The liver, spleen, pancreas, adrenal glands, kidneys, inferior vena cava, and retroperitoneal lymph nodes are unremarkable. Calcification of abdominal aorta without aneurysm.  No free fluid or free air in the abdomen.  Prominent visceral adipose tissues. Umbilical/periumbilical hernia containing fat.  Scattered surgical clips in the abdominal wall.  Pelvis:  Surgical clips in the right groin/inguinal region. Visualization of portions of the pelvis is limited due to streak artifact from right hip prosthesis.  The bladder wall appears mildly thickened.  This could be due to cystitis.  The uterus appears to be surgically absent.  No abnormal adnexal masses. Stool filled rectosigmoid colon.  No evidence of diverticulitis. The appendix is not identified.  No free or loculated pelvic fluid collections.  No significant pelvic lymphadenopathy.  Postoperative and degenerative changes in  the lumbar spine.  There is anterior subluxation of L4 on L5 but this appears stable since previous study.  No destructive bone lesions appreciated.  Degenerative disc disease at L2-3.  IMPRESSION: Suggestion of mild bladder wall thickening which could represent cystitis.  No evidence of bowel obstruction.  No acute processes otherwise identified.   Original Report Authenticated By: Burman Nieves, M.D.   Dg Abd Acute W/chest  06/28/2013   *RADIOLOGY REPORT*  Clinical Data: Constipation, confusion, vomiting, hypertension, diabetes, COPD, GERD  ACUTE ABDOMEN SERIES (ABDOMEN 2 VIEW & CHEST 1 VIEW)  Comparison: 03/03/2013  Findings: Right subclavian transvenous pacemaker leads project over right atrium and right ventricle. Enlargement of cardiac silhouette post CABG. Tortuous aorta. Pulmonary vascularity normal. Lungs clear. No pleural effusion or pneumothorax. Prior lumbar fusion. Surgical clips right upper quadrant question cholecystectomy. Prior right hip replacement. Nonobstructive bowel gas pattern. No bowel dilatation, bowel wall thickening, or free intraperitoneal air. Osteoarthritic changes left hip. Diffuse osseous  demineralization.  IMPRESSION: No acute abdominal findings. Enlargement of cardiac silhouette post pacemaker and CABG.   Original Report Authenticated By: Ulyses Southward, M.D.   Labs Reviewed  CBC WITH DIFFERENTIAL - Abnormal; Notable for the following:    RBC 3.50 (*)    Hemoglobin 10.0 (*)    HCT 30.2 (*)    All other components within normal limits  COMPREHENSIVE METABOLIC PANEL - Abnormal; Notable for the following:    Glucose, Bld 104 (*)    GFR calc non Af Amer 57 (*)    GFR calc Af Amer 66 (*)    All other components within normal limits  URINALYSIS, ROUTINE W REFLEX MICROSCOPIC - Abnormal; Notable for the following:    Hgb urine dipstick MODERATE (*)    All other components within normal limits  URINE MICROSCOPIC-ADD ON - Abnormal; Notable for the following:    Squamous  Epithelial / LPF MANY (*)    All other components within normal limits  PHENYTOIN LEVEL, TOTAL - Abnormal; Notable for the following:    Phenytoin Lvl <2.5 (*)    All other components within normal limits  CBC - Abnormal; Notable for the following:    RBC 3.38 (*)    Hemoglobin 9.6 (*)    HCT 29.4 (*)    All other components within normal limits  COMPREHENSIVE METABOLIC PANEL - Abnormal; Notable for the following:    Potassium 3.3 (*)    Glucose, Bld 118 (*)    GFR calc non Af Amer 55 (*)    GFR calc Af Amer 64 (*)    All other components within normal limits  HEMOGLOBIN A1C - Abnormal; Notable for the following:    Hemoglobin A1C 5.8 (*)    Mean Plasma Glucose 120 (*)    All other components within normal limits  GLUCOSE, CAPILLARY - Abnormal; Notable for the following:    Glucose-Capillary 111 (*)    All other components within normal limits  GLUCOSE, CAPILLARY - Abnormal; Notable for the following:    Glucose-Capillary 103 (*)    All other components within normal limits  GLUCOSE, CAPILLARY - Abnormal; Notable for the following:    Glucose-Capillary 127 (*)    All other components within normal limits  GLUCOSE, CAPILLARY - Abnormal; Notable for the following:    Glucose-Capillary 107 (*)    All other components within normal limits  GLUCOSE, CAPILLARY - Abnormal; Notable for the following:    Glucose-Capillary 137 (*)    All other components within normal limits  URINE CULTURE  LIPASE, BLOOD  LACTIC ACID, PLASMA  TROPONIN I  APTT  PROTIME-INR  TSH  BILIRUBIN, DIRECT  MAGNESIUM      Enid Skeens, MD 06/29/13 2246  Enid Skeens, MD 08/08/13 [phone]

## 2013-06-28 NOTE — ED Notes (Signed)
MD at bedside. 

## 2013-06-28 NOTE — ED Notes (Signed)
Pt alert to name, place but not to year or month, pt thinks it's April

## 2013-06-28 NOTE — ED Notes (Signed)
Pt's son in law here with pt. States pt is confused, not eating or drinking

## 2013-06-28 NOTE — ED Notes (Signed)
Attempted x 3 for IV access without success, CN to to assess for IV

## 2013-06-28 NOTE — H&P (Signed)
[facility] History and Physical  [name]  ZOX:[id number]  DOB: Jul 17, 1941   DOA: 06/28/2013   PCP:   Fredirick Maudlin, MD   Chief Complaint:  Acute confusion since yesterday  HPI: [name] is a 72 y.o. female.   Elderly obese Caucasian lady who lives at home and was brought to the emergency room by family because of acute confusion. Daughter reports that she was perfectly okay a week ago but started becoming very confused yesterday, and they presume this is due to constipation because whenever this is how she is been in the past when she got constipated.  There is no history of fever cough or cold chest pain or shortness of breath.  The patient is alert but not oriented to place or time, and her only current complaint is abdominal pain.     Rewiew of Systems:  Unable to obtain because of patient's mental status   Past Medical History  Diagnosis Date  . Diabetes mellitus, type II   . Hypertension   . Arteriosclerotic cardiovascular disease (ASCVD)     multivessel, CABG 6/10.  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  Myoview (11/20/11) showed no ischemia & EF 62%  . Gastroesophageal reflux disease     Hiatal hernia  . Hyperlipidemia   . Deep vein thrombosis, upper right extremity     post-PPM  . Obesity   . DDD (degenerative disc disease)   . Dehiscence of closure of sternum or sternotomy 07/2009    sternal infection; required flap closure  . COPD (chronic obstructive pulmonary disease)     severe lung disease by PFTs 6/12  . Sleep apnea   . Seizure disorder   . 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 Pacemaker placement  1993    Status post DDD pacemaker implantation in 1993, with Medtronic Kappa generator change in 2003 for  second-degree AV block, most recent gen change by Fawn Kirk 04/14/11  . Revision total hip arthroplasty    . Replacement total knee      Left  . Thyroid  surgery    . Wrist surgery    . 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 Reconstructive repair sternal      for infection s/p CABG 8/10  . Back surgery    . Esophagogastroduodenoscopy      with [facility] dilation,  biopsy, and disruption Schatzki's ring  . Colonoscopy  04/27/2005    hemorrhoids, diverticula  . Esophagogastroduodenoscopy  09/10/2012    RMR: Noncritical Schatzki's ring. Diffuse gastric erosions and an  area of partially healing ulceration-status post biopsy/ Small hiatal hernia. Bx reactive gastropathy.  . Abdominal hysterectomy    . Colonoscopy with esophagogastroduodenoscopy (egd) N/A 02/20/2013    RMR: pancolonic diverticulosis and internal hemorrhoids. EGD with small hiatal hernia, healed gastric ulcer    Medications:  HOME MEDS: Prior to Admission medications   Medication Sig Start Date End Date Taking? Authorizing Provider  amLODipine (NORVASC) 10 MG tablet Take 10 mg by mouth daily. 11/21/11  Yes Dayna N Dunn, PA-C  aspirin EC 81 MG tablet Take 81 mg by mouth daily.   Yes Historical Provider, MD  atorvastatin (LIPITOR) 40 MG tablet Take 40 mg by mouth daily.   Yes Historical Provider, MD  Bromfenac Sodium (BROMDAY) 0.09 % SOLN Place 1  drop into the right eye 3 (three) times daily.   Yes Historical Provider, MD  doxepin (SINEQUAN) 25 MG capsule Take 25 mg by mouth at bedtime.   Yes Historical Provider, MD  estradiol (ESTRACE) 0.5 MG tablet Take 0.5 mg by mouth daily.     Yes Historical Provider, MD  fentaNYL (DURAGESIC - DOSED MCG/HR) 100 MCG/HR Place 1 patch onto the skin every 3 (three) days.   Yes Historical Provider, MD  magnesium hydroxide (MILK OF MAGNESIA) 400 MG/5ML suspension Takes couple of tablespoons daily.   Yes Historical Provider, MD  metFORMIN (GLUCOPHAGE) 500 MG tablet Take 500 mg by mouth 2 (two) times daily with a meal.    Yes Historical Provider, MD  metoprolol tartrate (LOPRESSOR) 25 MG tablet Take 12.5 mg  by mouth 2 (two) times daily.   Yes Historical Provider, MD  pantoprazole (PROTONIX) 40 MG tablet Take 1 tablet (40 mg total) by mouth 2 (two) times daily. 02/13/13  Yes Nira Retort, NP  phenytoin (DILANTIN) 100 MG ER capsule Take 300 mg by mouth at bedtime.     Yes Historical Provider, MD  potassium chloride (K-DUR) 10 MEQ tablet Take 2 tablets (20 mEq total) by mouth daily. 11/09/12  Yes Scott T Weaver, PA-C  senna-docusate (SENOKOT-S) 8.6-50 MG per tablet Take 1 tablet by mouth daily as needed for constipation.    Yes Historical Provider, MD  traMADol (ULTRAM) 50 MG tablet Take 50 mg by mouth every 6 (six) hours as needed for pain.   Yes Historical Provider, MD  hydrALAZINE (APRESOLINE) 25 MG tablet Take 25 mg by mouth every 8 (eight) hours. 11/21/11   Dayna N Dunn, PA-C  nitroGLYCERIN (NITROSTAT) 0.4 MG SL tablet Place 0.4 mg under the tongue every 5 (five) minutes as needed for chest pain.  11/21/11   Dayna N Dunn, PA-C     Allergies:  Allergies  Allergen Reactions  . Codeine     "good" headache  . Other     Leafy raw vegetables and seeds  . Oxycodone Hcl     "good" headache; also OxyContin  . Reglan (Metoclopramide)     CONFUSION    Social History:   reports that she has never smoked. She has never used smokeless tobacco. She reports that she does not drink alcohol or use illicit drugs.  Family History: Family History  Problem Relation Age of Onset  . Diabetes    . Heart disease Mother     MI  . Coronary artery disease      Female <55  . Cancer    . Colon cancer Father     age 87     Physical Exam: Filed Vitals:   06/28/13 2045 06/28/13 2100 06/28/13 2115 06/28/13 2200  BP:  180/83  181/83  Pulse: 61 62 61 63  Temp:    98.2 F (36.8 C)  TempSrc:    Oral  Resp: 14 13 14 20   Height:    5\' 2"  (1.575 m)  Weight:    89.6 kg (197 lb 8.5 oz)  SpO2: 97% 96% 97% 98%   Blood pressure 181/83, pulse 63, temperature 98.2 F (36.8 C), temperature source Oral, resp. rate 20,  height 5\' 2"  (1.575 m), weight 89.6 kg (197 lb 8.5 oz), SpO2 98.00%. Body mass index is 36.12 kg/(m^2).   GEN:  Pleasant obese African American lady lying bed in no acute distress; cooperative with exam PSYCH:  alert and oriented x1; neither anxious nor depressed; affect is  appropriate. HEENT: Mucous membranes pink and anicteric; PERRLA; EOM intact; Breasts:: Not examined CHEST WALL: No tenderness CHEST: Normal respiration, clear to auscultation bilaterally HEART: Regular rate and rhythm; no murmurs rubs or gallops no CVA tenderness ABDOMEN: Obese, soft,  tender right upper quadrant and bilateral lower abdomen; no masses, no organomegaly, normal abdominal bowel sounds; moderate pannus; no intertriginous candida. Rectal Exam: Not done EXTREMITIES:  age-appropriate arthropathy of the hands and knees; no edema; no ulcerations. Genitalia: not examined PULSES: 2+ and symmetric SKIN: Normal hydration no rash or ulceration CNS: Cranial nerves 2-12 grossly intact no focal lateralizing neurologic deficit   Labs on Admission:  Basic Metabolic Panel:  Recent Labs Lab 06/28/13 1554  NA 137  K 3.7  CL 102  CO2 25  GLUCOSE 104*  BUN 16  CREATININE 0.96  CALCIUM 10.5   Liver Function Tests:  Recent Labs Lab 06/28/13 1554  AST 28  ALT 21  ALKPHOS 103  BILITOT 0.3  PROT 8.2  ALBUMIN 4.2    Recent Labs Lab 06/28/13 1554  LIPASE 58   No results found for this basename: AMMONIA,  in the last 168 hours CBC:  Recent Labs Lab 06/28/13 1554  WBC 8.7  NEUTROABS 6.0  HGB 10.0*  HCT 30.2*  MCV 86.3  PLT 233   Cardiac Enzymes:  Recent Labs Lab 06/28/13 1554  TROPONINI <0.30   BNP: No components found with this basename: POCBNP,  D-dimer: No components found with this basename: D-DIMER,  CBG: No results found for this basename: GLUCAP,  in the last 168 hours  Radiological Exams on Admission: Ct Head Wo Contrast  06/28/2013   *RADIOLOGY REPORT*  Clinical Data:  Mental status change, history hypertension, diabetes, seizures, COPD, vascular disease  CT HEAD WITHOUT CONTRAST  Technique:  Contiguous axial images were obtained from the base of the skull through the vertex without contrast.  Comparison: None  Findings: Generalized atrophy. Normal ventricular morphology. No midline shift or mass effect. Small vessel chronic ischemic changes of deep cerebral white matter. No intracranial hemorrhage, mass lesion, or evidence of acute infarction. No extra-axial fluid collections. Atherosclerotic calcifications at skull base. No acute bone or sinus abnormality.  IMPRESSION: Atrophy with small vessel chronic ischemic changes of deep cerebral white matter. No acute intracranial abnormalities.   Original Report Authenticated By: Ulyses Southward, M.D.   Dg Abd Acute W/chest  06/28/2013   *RADIOLOGY REPORT*  Clinical Data: Constipation, confusion, vomiting, hypertension, diabetes, COPD, GERD  ACUTE ABDOMEN SERIES (ABDOMEN 2 VIEW & CHEST 1 VIEW)  Comparison: 03/03/2013  Findings: Right subclavian transvenous pacemaker leads project over right atrium and right ventricle. Enlargement of cardiac silhouette post CABG. Tortuous aorta. Pulmonary vascularity normal. Lungs clear. No pleural effusion or pneumothorax. Prior lumbar fusion. Surgical clips right upper quadrant question cholecystectomy. Prior right hip replacement. Nonobstructive bowel gas pattern. No bowel dilatation, bowel wall thickening, or free intraperitoneal air. Osteoarthritic changes left hip. Diffuse osseous demineralization.  IMPRESSION: No acute abdominal findings. Enlargement of cardiac silhouette post pacemaker and CABG.   Original Report Authenticated By: Ulyses Southward, M.D.    EKG: Independently reviewed.Atrial paced rhythm   Assessment/Plan   Active Problems:   Chronic diastolic heart failure   Diabetes mellitus, type II   Hypertension   Obesity   COPD (chronic obstructive pulmonary disease)   Osteoarthritis  of left hip   Acute delirium   Unspecified constipation   PLAN: This lady has no lateralizing signs to a point  an acute stroke; she is  nontoxic in appearance, and gives the impression of chronic dementia rather than acute delirium; Will give trial of soapsuds enema, followed by CT scan of the abdomen.  We'll continue her home meds, for the most part, but withhold anything that may be contributing to confusion; will maintain her narcotics and benzodiazepines to prevent withdrawal;  We'll hydrate cautiously;  May need MRI of the brain if not improving;  She'll be seen by her primary care physician in the morning who is more familiar with her baseline state   Other plans as per orders.  Code Status: Full code Family Communication: Patient discussed with  daughter by phone   Oswald Pott Nocturnist [facility] Pager [phone]  06/28/2013, 10:59 PM

## 2013-06-28 NOTE — ED Notes (Signed)
Pt c/o constipation, LBM ? A week ago, fleets enema today with small amount of results, decrease in appetite

## 2013-06-29 ENCOUNTER — Inpatient Hospital Stay (HOSPITAL_COMMUNITY): Payer: PRIVATE HEALTH INSURANCE

## 2013-06-29 ENCOUNTER — Encounter (HOSPITAL_COMMUNITY): Payer: Self-pay | Admitting: Internal Medicine

## 2013-06-29 DIAGNOSIS — K5641 Fecal impaction: Secondary | ICD-10-CM | POA: Diagnosis present

## 2013-06-29 DIAGNOSIS — R410 Disorientation, unspecified: Secondary | ICD-10-CM | POA: Diagnosis present

## 2013-06-29 LAB — COMPREHENSIVE METABOLIC PANEL
ALT: 18 U/L (ref 0–35)
AST: 25 U/L (ref 0–37)
Albumin: 3.9 g/dL (ref 3.5–5.2)
Alkaline Phosphatase: 100 U/L (ref 39–117)
Chloride: 104 mEq/L (ref 96–112)
Potassium: 3.3 mEq/L — ABNORMAL LOW (ref 3.5–5.1)
Sodium: 140 mEq/L (ref 135–145)
Total Bilirubin: 0.3 mg/dL (ref 0.3–1.2)

## 2013-06-29 LAB — TSH: TSH: 1.121 u[IU]/mL (ref 0.350–4.500)

## 2013-06-29 LAB — GLUCOSE, CAPILLARY
Glucose-Capillary: 103 mg/dL — ABNORMAL HIGH (ref 70–99)
Glucose-Capillary: 127 mg/dL — ABNORMAL HIGH (ref 70–99)

## 2013-06-29 LAB — CBC
HCT: 29.4 % — ABNORMAL LOW (ref 36.0–46.0)
Platelets: 205 10*3/uL (ref 150–400)
RDW: 14.6 % (ref 11.5–15.5)
WBC: 7.6 10*3/uL (ref 4.0–10.5)

## 2013-06-29 LAB — APTT: aPTT: 35 seconds (ref 24–37)

## 2013-06-29 LAB — PROTIME-INR
INR: 1.16 (ref 0.00–1.49)
Prothrombin Time: 14.6 seconds (ref 11.6–15.2)

## 2013-06-29 LAB — MAGNESIUM: Magnesium: 1.7 mg/dL (ref 1.5–2.5)

## 2013-06-29 LAB — BILIRUBIN, DIRECT: Bilirubin, Direct: 0.1 mg/dL (ref 0.0–0.3)

## 2013-06-29 MED ORDER — ONDANSETRON HCL 4 MG/2ML IJ SOLN
4.0000 mg | INTRAMUSCULAR | Status: DC | PRN
  Administered 2013-06-30 – 2013-07-02 (×3): 4 mg via INTRAVENOUS
  Filled 2013-06-29 (×3): qty 2

## 2013-06-29 MED ORDER — INSULIN ASPART 100 UNIT/ML ~~LOC~~ SOLN: 0.0000 [IU] | Freq: Every day | SUBCUTANEOUS | Status: DC

## 2013-06-29 MED ORDER — ESTRADIOL 1 MG PO TABS
0.5000 mg | ORAL_TABLET | Freq: Every day | ORAL | Status: DC
  Administered 2013-06-29 – 2013-07-03 (×4): 0.5 mg via ORAL
  Filled 2013-06-29 (×7): qty 0.5

## 2013-06-29 MED ORDER — MILK AND MOLASSES ENEMA
Freq: Once | RECTAL | Status: AC
  Administered 2013-06-29: 06:00:00 via RECTAL

## 2013-06-29 MED ORDER — NITROGLYCERIN 0.4 MG SL SUBL: 0.4000 mg | SUBLINGUAL_TABLET | SUBLINGUAL | Status: DC | PRN

## 2013-06-29 MED ORDER — PHENYTOIN SODIUM EXTENDED 100 MG PO CAPS
300.0000 mg | ORAL_CAPSULE | Freq: Every day | ORAL | Status: DC
  Administered 2013-06-29 – 2013-07-02 (×5): 300 mg via ORAL
  Filled 2013-06-29 (×5): qty 3

## 2013-06-29 MED ORDER — SODIUM CHLORIDE 0.9 % IJ SOLN
3.0000 mL | Freq: Two times a day (BID) | INTRAMUSCULAR | Status: DC
  Administered 2013-06-29 – 2013-07-01 (×4): 3 mL via INTRAVENOUS

## 2013-06-29 MED ORDER — PANTOPRAZOLE SODIUM 40 MG PO TBEC
40.0000 mg | DELAYED_RELEASE_TABLET | Freq: Two times a day (BID) | ORAL | Status: DC
  Administered 2013-06-29 – 2013-07-02 (×7): 40 mg via ORAL
  Filled 2013-06-29 (×7): qty 1

## 2013-06-29 MED ORDER — ATORVASTATIN CALCIUM 40 MG PO TABS
40.0000 mg | ORAL_TABLET | Freq: Every day | ORAL | Status: DC
  Administered 2013-06-29 – 2013-07-02 (×3): 40 mg via ORAL
  Filled 2013-06-29 (×3): qty 1

## 2013-06-29 MED ORDER — METFORMIN HCL 500 MG PO TABS
500.0000 mg | ORAL_TABLET | Freq: Two times a day (BID) | ORAL | Status: DC
  Administered 2013-06-29 (×2): 500 mg via ORAL
  Filled 2013-06-29 (×3): qty 1

## 2013-06-29 MED ORDER — MAGNESIUM HYDROXIDE 400 MG/5ML PO SUSP: 15.0000 mL | Freq: Every day | ORAL | Status: DC | PRN

## 2013-06-29 MED ORDER — IOHEXOL 300 MG/ML  SOLN
100.0000 mL | Freq: Once | INTRAMUSCULAR | Status: AC | PRN
  Administered 2013-06-29: 100 mL via INTRAVENOUS

## 2013-06-29 MED ORDER — ACETAMINOPHEN 325 MG PO TABS
650.0000 mg | ORAL_TABLET | Freq: Four times a day (QID) | ORAL | Status: DC | PRN
  Administered 2013-06-29 – 2013-07-02 (×2): 650 mg via ORAL
  Filled 2013-06-29 (×2): qty 2

## 2013-06-29 MED ORDER — HYDRALAZINE HCL 25 MG PO TABS
25.0000 mg | ORAL_TABLET | Freq: Three times a day (TID) | ORAL | Status: DC
  Administered 2013-06-29 – 2013-07-03 (×12): 25 mg via ORAL
  Filled 2013-06-29 (×13): qty 1

## 2013-06-29 MED ORDER — ENOXAPARIN SODIUM 30 MG/0.3ML ~~LOC~~ SOLN
30.0000 mg | SUBCUTANEOUS | Status: DC
  Administered 2013-06-29 – 2013-07-01 (×3): 30 mg via SUBCUTANEOUS
  Filled 2013-06-29 (×3): qty 0.3

## 2013-06-29 MED ORDER — POTASSIUM CHLORIDE IN NACL 20-0.9 MEQ/L-% IV SOLN
INTRAVENOUS | Status: DC
  Administered 2013-06-29 – 2013-07-03 (×3): via INTRAVENOUS

## 2013-06-29 MED ORDER — TRAZODONE HCL 50 MG PO TABS
50.0000 mg | ORAL_TABLET | Freq: Every evening | ORAL | Status: DC | PRN
  Administered 2013-06-30: 50 mg via ORAL
  Filled 2013-06-29: qty 1

## 2013-06-29 MED ORDER — AMLODIPINE BESYLATE 5 MG PO TABS
10.0000 mg | ORAL_TABLET | Freq: Every day | ORAL | Status: DC
  Administered 2013-06-29 – 2013-07-03 (×4): 10 mg via ORAL
  Filled 2013-06-29 (×4): qty 2

## 2013-06-29 MED ORDER — IOHEXOL 300 MG/ML  SOLN
50.0000 mL | Freq: Once | INTRAMUSCULAR | Status: AC | PRN
  Administered 2013-06-29: 50 mL via ORAL

## 2013-06-29 MED ORDER — ASPIRIN EC 81 MG PO TBEC
81.0000 mg | DELAYED_RELEASE_TABLET | Freq: Every day | ORAL | Status: DC
  Administered 2013-06-29 – 2013-07-03 (×4): 81 mg via ORAL
  Filled 2013-06-29 (×4): qty 1

## 2013-06-29 MED ORDER — INSULIN ASPART 100 UNIT/ML ~~LOC~~ SOLN
0.0000 [IU] | Freq: Three times a day (TID) | SUBCUTANEOUS | Status: DC
  Administered 2013-06-29: 1 [IU] via SUBCUTANEOUS
  Administered 2013-06-30 (×3): 2 [IU] via SUBCUTANEOUS
  Administered 2013-07-01: 1 [IU] via SUBCUTANEOUS

## 2013-06-29 MED ORDER — BROMFENAC SODIUM (ONCE-DAILY) 0.09 % OP SOLN: 1.0000 [drp] | Freq: Three times a day (TID) | OPHTHALMIC | Status: DC

## 2013-06-29 MED ORDER — FENTANYL 100 MCG/HR TD PT72: 100.0000 ug | MEDICATED_PATCH | TRANSDERMAL | Status: DC

## 2013-06-29 MED ORDER — KETOROLAC TROMETHAMINE 0.5 % OP SOLN
1.0000 [drp] | Freq: Four times a day (QID) | OPHTHALMIC | Status: DC
  Administered 2013-06-29 – 2013-07-03 (×16): 1 [drp] via OPHTHALMIC
  Filled 2013-06-29: qty 3

## 2013-06-29 MED ORDER — METOPROLOL TARTRATE 25 MG PO TABS
12.5000 mg | ORAL_TABLET | Freq: Two times a day (BID) | ORAL | Status: DC
  Administered 2013-06-29 – 2013-07-03 (×9): 12.5 mg via ORAL
  Filled 2013-06-29 (×10): qty 1

## 2013-06-29 MED ORDER — CIPROFLOXACIN HCL 250 MG PO TABS
250.0000 mg | ORAL_TABLET | Freq: Two times a day (BID) | ORAL | Status: DC
  Administered 2013-06-29 (×2): 250 mg via ORAL
  Filled 2013-06-29 (×3): qty 1

## 2013-06-29 NOTE — Progress Notes (Signed)
Subjective: She was admitted with acute delirium decreased appetite constipation. Apparently this has been going on for about a week. She says she was started on a pain patch about a week ago  Objective: Vital signs in last 24 hours: Temp:  [98.2 F (36.8 C)-98.3 F (36.8 C)] 98.3 F (36.8 C) (07/26 0441) Pulse Rate:  [61-98] 63 (07/26 0839) Resp:  [12-20] 20 (07/26 0441) BP: (175-206)/(71-83) 206/80 mmHg (07/26 0441) SpO2:  [91 %-99 %] 91 % (07/26 0839) Weight:  [89.6 kg (197 lb 8.5 oz)-90.719 kg (200 lb)] 90.2 kg (198 lb 13.7 oz) (07/26 0441) Weight change:  Last BM Date: 06/28/13 (per patient just a small BM)  Intake/Output from previous day: 07/25 0701 - 07/26 0700 In: 1245.8 [I.V.:1245.8] Out: 8 [Urine:8]  PHYSICAL EXAM General appearance: alert, cooperative and no distress Resp: clear to auscultation bilaterally Cardio: regular rate and rhythm, S1, S2 normal, no murmur, click, rub or gallop GI: soft, non-tender; bowel sounds normal; no masses,  no organomegaly Extremities: extremities normal, atraumatic, no cyanosis or edema  Lab Results:    Basic Metabolic Panel:  Recent Labs  41/32/44 1554 06/29/13 0152  NA 137 140  K 3.7 3.3*  CL 102 104  CO2 25 27  GLUCOSE 104* 118*  BUN 16 11  CREATININE 0.96 0.98  CALCIUM 10.5 10.2  MG  --  1.7   Liver Function Tests:  Recent Labs  06/28/13 1554 06/29/13 0152  AST 28 25  ALT 21 18  ALKPHOS 103 100  BILITOT 0.3 0.3  PROT 8.2 7.9  ALBUMIN 4.2 3.9    Recent Labs  06/28/13 1554  LIPASE 58   No results found for this basename: AMMONIA,  in the last 72 hours CBC:  Recent Labs  06/28/13 1554 06/29/13 0152  WBC 8.7 7.6  NEUTROABS 6.0  --   HGB 10.0* 9.6*  HCT 30.2* 29.4*  MCV 86.3 87.0  PLT 233 205   Cardiac Enzymes:  Recent Labs  06/28/13 1554  TROPONINI <0.30   BNP: No results found for this basename: PROBNP,  in the last 72 hours D-Dimer: No results found for this basename: DDIMER,  in  the last 72 hours CBG:  Recent Labs  06/29/13 0143 06/29/13 0736  GLUCAP 111* 103*   Hemoglobin A1C: No results found for this basename: HGBA1C,  in the last 72 hours Fasting Lipid Panel: No results found for this basename: CHOL, HDL, LDLCALC, TRIG, CHOLHDL, LDLDIRECT,  in the last 72 hours Thyroid Function Tests: No results found for this basename: TSH, T4TOTAL, FREET4, T3FREE, THYROIDAB,  in the last 72 hours Anemia Panel: No results found for this basename: VITAMINB12, FOLATE, FERRITIN, TIBC, IRON, RETICCTPCT,  in the last 72 hours Coagulation:  Recent Labs  06/29/13 0152  LABPROT 14.6  INR 1.16   Urine Drug Screen: Drugs of Abuse  No results found for this basename: labopia, cocainscrnur, labbenz, amphetmu, thcu, labbarb    Alcohol Level: No results found for this basename: ETH,  in the last 72 hours Urinalysis:  Recent Labs  06/28/13 1746  COLORURINE YELLOW  LABSPEC 1.020  PHURINE 6.0  GLUCOSEU NEGATIVE  HGBUR MODERATE*  BILIRUBINUR NEGATIVE  KETONESUR NEGATIVE  PROTEINUR NEGATIVE  UROBILINOGEN 0.2  NITRITE NEGATIVE  LEUKOCYTESUR NEGATIVE   Misc. Labs:  ABGS No results found for this basename: PHART, PCO2, PO2ART, TCO2, HCO3,  in the last 72 hours CULTURES No results found for this or any previous visit (from the past 240 hour(s)). Studies/Results: Ct  Head Wo Contrast  06/28/2013   *RADIOLOGY REPORT*  Clinical Data: Mental status change, history hypertension, diabetes, seizures, COPD, vascular disease  CT HEAD WITHOUT CONTRAST  Technique:  Contiguous axial images were obtained from the base of the skull through the vertex without contrast.  Comparison: None  Findings: Generalized atrophy. Normal ventricular morphology. No midline shift or mass effect. Small vessel chronic ischemic changes of deep cerebral white matter. No intracranial hemorrhage, mass lesion, or evidence of acute infarction. No extra-axial fluid collections. Atherosclerotic calcifications  at skull base. No acute bone or sinus abnormality.  IMPRESSION: Atrophy with small vessel chronic ischemic changes of deep cerebral white matter. No acute intracranial abnormalities.   Original Report Authenticated By: Ulyses Southward, M.D.   Ct Abdomen Pelvis W Contrast  06/29/2013   *RADIOLOGY REPORT*  Clinical Data: Abdominal pain.  Confusion.  Decreased appetite and epigastric pain.  Constipation.  CT ABDOMEN AND PELVIS WITH CONTRAST  Technique:  Multidetector CT imaging of the abdomen and pelvis was performed following the standard protocol during bolus administration of intravenous contrast.  Contrast: 50mL OMNIPAQUE IOHEXOL 300 MG/ML  SOLN, OMNIPAQUE IOHEXOL 300 MG/ML  SOLN  Comparison: 09/16/2012  Findings: Atelectasis or fibrosis in the lung bases.  Cardiac enlargement.  Surgical absence of the gallbladder.  Small esophageal hiatal hernia.  Gastric wall is not thickened.  Small bowel are not abnormally distended.  No bowel wall thickening.  Stool filled colon without distension or wall thickening.  The liver, spleen, pancreas, adrenal glands, kidneys, inferior vena cava, and retroperitoneal lymph nodes are unremarkable. Calcification of abdominal aorta without aneurysm.  No free fluid or free air in the abdomen.  Prominent visceral adipose tissues. Umbilical/periumbilical hernia containing fat.  Scattered surgical clips in the abdominal wall.  Pelvis:  Surgical clips in the right groin/inguinal region. Visualization of portions of the pelvis is limited due to streak artifact from right hip prosthesis.  The bladder wall appears mildly thickened.  This could be due to cystitis.  The uterus appears to be surgically absent.  No abnormal adnexal masses. Stool filled rectosigmoid colon.  No evidence of diverticulitis. The appendix is not identified.  No free or loculated pelvic fluid collections.  No significant pelvic lymphadenopathy.  Postoperative and degenerative changes in the lumbar spine.  There is  anterior subluxation of L4 on L5 but this appears stable since previous study.  No destructive bone lesions appreciated.  Degenerative disc disease at L2-3.  IMPRESSION: Suggestion of mild bladder wall thickening which could represent cystitis.  No evidence of bowel obstruction.  No acute processes otherwise identified.   Original Report Authenticated By: Burman Nieves, M.D.   Dg Abd Acute W/chest  06/28/2013   *RADIOLOGY REPORT*  Clinical Data: Constipation, confusion, vomiting, hypertension, diabetes, COPD, GERD  ACUTE ABDOMEN SERIES (ABDOMEN 2 VIEW & CHEST 1 VIEW)  Comparison: 03/03/2013  Findings: Right subclavian transvenous pacemaker leads project over right atrium and right ventricle. Enlargement of cardiac silhouette post CABG. Tortuous aorta. Pulmonary vascularity normal. Lungs clear. No pleural effusion or pneumothorax. Prior lumbar fusion. Surgical clips right upper quadrant question cholecystectomy. Prior right hip replacement. Nonobstructive bowel gas pattern. No bowel dilatation, bowel wall thickening, or free intraperitoneal air. Osteoarthritic changes left hip. Diffuse osseous demineralization.  IMPRESSION: No acute abdominal findings. Enlargement of cardiac silhouette post pacemaker and CABG.   Original Report Authenticated By: Ulyses Southward, M.D.    Medications:  Prior to Admission:  Prescriptions prior to admission  Medication Sig Dispense Refill  . amLODipine (  NORVASC) 10 MG tablet Take 10 mg by mouth daily.      Marland Kitchen aspirin EC 81 MG tablet Take 81 mg by mouth daily.      Marland Kitchen atorvastatin (LIPITOR) 40 MG tablet Take 40 mg by mouth daily.      . Bromfenac Sodium (BROMDAY) 0.09 % SOLN Place 1 drop into the right eye 3 (three) times daily.      Marland Kitchen doxepin (SINEQUAN) 25 MG capsule Take 25 mg by mouth at bedtime.      Marland Kitchen estradiol (ESTRACE) 0.5 MG tablet Take 0.5 mg by mouth daily.        . fentaNYL (DURAGESIC - DOSED MCG/HR) 100 MCG/HR Place 1 patch onto the skin every 3 (three) days.       . magnesium hydroxide (MILK OF MAGNESIA) 400 MG/5ML suspension Takes couple of tablespoons daily.      . metFORMIN (GLUCOPHAGE) 500 MG tablet Take 500 mg by mouth 2 (two) times daily with a meal.       . metoprolol tartrate (LOPRESSOR) 25 MG tablet Take 12.5 mg by mouth 2 (two) times daily.      . pantoprazole (PROTONIX) 40 MG tablet Take 1 tablet (40 mg total) by mouth 2 (two) times daily.  60 tablet  11  . phenytoin (DILANTIN) 100 MG ER capsule Take 300 mg by mouth at bedtime.        . potassium chloride (K-DUR) 10 MEQ tablet Take 2 tablets (20 mEq total) by mouth daily.  60 tablet  3  . senna-docusate (SENOKOT-S) 8.6-50 MG per tablet Take 1 tablet by mouth daily as needed for constipation.       . traMADol (ULTRAM) 50 MG tablet Take 50 mg by mouth every 6 (six) hours as needed for pain.      . hydrALAZINE (APRESOLINE) 25 MG tablet Take 25 mg by mouth every 8 (eight) hours.      . nitroGLYCERIN (NITROSTAT) 0.4 MG SL tablet Place 0.4 mg under the tongue every 5 (five) minutes as needed for chest pain.        Scheduled: . amLODipine  10 mg Oral Daily  . aspirin EC  81 mg Oral Daily  . atorvastatin  40 mg Oral QPC supper  . enoxaparin (LOVENOX) injection  30 mg Subcutaneous Q24H  . estradiol  0.5 mg Oral Daily  . [START ON 06/30/2013] fentaNYL  100 mcg Transdermal Q72H  . hydrALAZINE  25 mg Oral Q8H  . insulin aspart  0-5 Units Subcutaneous QHS  . insulin aspart  0-9 Units Subcutaneous TID WC  . ketorolac  1 drop Right Eye QID  . metFORMIN  500 mg Oral BID WC  . metoprolol tartrate  12.5 mg Oral BID  . pantoprazole  40 mg Oral BID  . phenytoin  300 mg Oral QHS  . sodium chloride  3 mL Intravenous Q12H   Continuous: . 0.9 % NaCl with KCl 20 mEq / L 50 mL/hr at 06/29/13 0143   ZOX:[id number], magnesium hydroxide, nitroGLYCERIN, ondansetron (ZOFRAN) IV, traZODone  Assesment: She's had confusion. I was concerned that it was perhaps related to her fentanyl patch but she has been on  this since September of 2013 at current dose. I think it may have been due to constipation but she did have some bacteria white blood cells etc. in her urine so I'm going to treat her for a urinary tract infection. She has multiple other medical problems at all that seems relatively stable at this  time Active Problems:   Chronic diastolic heart failure   Diabetes mellitus, type II   Hypertension   Obesity   COPD (chronic obstructive pulmonary disease)   Osteoarthritis of left hip   Acute delirium   Unspecified constipation    Plan: Continue treatments. I will add Cipro for UTI. She will continue other medications. She may be able to go home tomorrow if she continues as she is    LOS: 1 day   Cecille Mcclusky L 06/29/2013, 9:09 AM

## 2013-06-29 NOTE — Plan of Care (Signed)
Problem: Consults Goal: General Medical Patient Education See Patient Education Module for specific education. Assessed personal needs and any safety issues.

## 2013-06-30 ENCOUNTER — Inpatient Hospital Stay (HOSPITAL_COMMUNITY): Payer: PRIVATE HEALTH INSURANCE

## 2013-06-30 LAB — URINE CULTURE: Colony Count: NO GROWTH

## 2013-06-30 LAB — APTT: aPTT: 31 seconds (ref 24–37)

## 2013-06-30 LAB — CBC
HCT: 33.2 % — ABNORMAL LOW (ref 36.0–46.0)
Hemoglobin: 11.4 g/dL — ABNORMAL LOW (ref 12.0–15.0)
MCV: 84.3 fL (ref 78.0–100.0)
RBC: 3.94 MIL/uL (ref 3.87–5.11)
RDW: 14.3 % (ref 11.5–15.5)
WBC: 12.9 10*3/uL — ABNORMAL HIGH (ref 4.0–10.5)

## 2013-06-30 LAB — BASIC METABOLIC PANEL
BUN: 8 mg/dL (ref 6–23)
CO2: 23 mEq/L (ref 19–32)
Chloride: 97 mEq/L (ref 96–112)
Creatinine, Ser: 0.92 mg/dL (ref 0.50–1.10)
GFR calc Af Amer: 69 mL/min — ABNORMAL LOW (ref >90)
Glucose, Bld: 163 mg/dL — ABNORMAL HIGH (ref 70–99)
Potassium: 3 mEq/L — ABNORMAL LOW (ref 3.5–5.1)

## 2013-06-30 LAB — TROPONIN I
Troponin I: 0.43 ng/mL (ref <0.30)
Troponin I: 0.89 ng/mL (ref <0.30)
Troponin I: 0.62 ng/mL (ref <0.30)

## 2013-06-30 LAB — GLUCOSE, CAPILLARY
Glucose-Capillary: 154 mg/dL — ABNORMAL HIGH (ref 70–99)
Glucose-Capillary: 145 mg/dL — ABNORMAL HIGH (ref 70–99)

## 2013-06-30 MED ORDER — PROMETHAZINE HCL 25 MG RE SUPP
25.0000 mg | RECTAL | Status: DC | PRN
  Administered 2013-06-30 – 2013-07-02 (×3): 25 mg via RECTAL
  Filled 2013-06-30 (×4): qty 1

## 2013-06-30 MED ORDER — DIAZEPAM 2.5 MG RE GEL
2.5000 mg | RECTAL | Status: DC | PRN
  Filled 2013-06-30: qty 2.5

## 2013-06-30 MED ORDER — SODIUM CHLORIDE 0.9 % IV SOLN
INTRAVENOUS | Status: DC
  Administered 2013-07-01: 1000 mL via INTRAVENOUS
  Administered 2013-07-01: 08:00:00 via INTRAVENOUS
  Administered 2013-07-02: 1000 mL via INTRAVENOUS

## 2013-06-30 MED ORDER — METOPROLOL TARTRATE 1 MG/ML IV SOLN
INTRAVENOUS | Status: AC
  Administered 2013-06-30: 5 mg via INTRAVENOUS
  Filled 2013-06-30: qty 5

## 2013-06-30 MED ORDER — METOPROLOL TARTRATE 1 MG/ML IV SOLN
5.0000 mg | INTRAVENOUS | Status: DC | PRN
  Administered 2013-06-30 – 2013-07-02 (×3): 5 mg via INTRAVENOUS
  Filled 2013-06-30 (×3): qty 5

## 2013-06-30 MED ORDER — METOPROLOL TARTRATE 1 MG/ML IV SOLN: 5.0000 mg | Freq: Once | INTRAVENOUS | Status: AC

## 2013-06-30 NOTE — Procedures (Signed)
Central Venous Catheter Insertion Procedure Note [name] [id number] February 22, 1941  Procedure: Insertion of Central Venous Catheter Indications: Drug and/or fluid administration and Frequent blood sampling  Procedure Details Consent: Unable to obtain consent because of emergent medical necessity. Time Out: Verified patient identification, verified procedure, site/side was marked, verified correct patient position, special equipment/implants available, medications/allergies/relevent history reviewed, required imaging and test results available.  Performed  Maximum sterile technique was used including antiseptics, cap, gloves, gown, hand hygiene, mask and sheet. Skin prep: Chlorhexidine; local anesthetic administered A antimicrobial bonded/coated triple lumen catheter was placed in the right femoral vein due to emergent situation using the Seldinger technique.  Evaluation Blood flow good Complications: No apparent complications Patient did tolerate procedure well.   [name] A 06/30/2013, 9:51 AM

## 2013-06-30 NOTE — Progress Notes (Signed)
Dr Sudie Bailey who is covering for Dr Juanetta Gosling paged and made aware that pts head CT was negative. Dr Sudie Bailey also made aware of EKG results and elevated Troponin of 0.43.  Dr Lovell Sheehan also on floor and made aware that right femoral central line was also oozing blood around insertion site with about a quarter sized clot present under dressing. Dr Lovell Sheehan looked at line and stated it looked ok. Dressing changed. All 3 IV ports flushed without difficulty. Blood returned noted. Will continue to monitor.

## 2013-06-30 NOTE — Progress Notes (Signed)
RAPID RESPONSE/CODE STROKE CALLED ON PT. SX WERE ALTERED MENTAL STATUS,LT SIDE FACIAL DROOPING.BP 201/106. NO 8IV ACCESS. O2 STARTED AT 2L/MIN VIA [place] AND O2 SATS 96%. UNABLE TO OBTAIN IV ACCESS OR DRAW CODE STROKE PROTOCOL LABS. DR JENKINS CLLED TO COME AND INSERT CENTRAL LINE. CT NOTIFIED OF NEED FOR STAT HEAD CT. PT AROUSED AND MOVING ALL EXTREMITI3ES. SPEAK IS SLOW BUT UNDERSTANDABLE.. 

## 2013-06-30 NOTE — Progress Notes (Signed)
[name], [name]                ACCOUNT NO.:  [id number]  MEDICAL RECORD NO.:  [id number]  LOCATION:  IC02                          FACILITY:  [facility]  PHYSICIAN:  Mila Homer. Sudie Bailey, M.D.DATE OF BIRTH:  1941-12-04  DATE OF PROCEDURE: DATE OF DISCHARGE:                                PROGRESS NOTE   SUBJECTIVE:  A 72 year old who is a patient of Dr. Shaune Pollack.  She presented to the hospital 2 days ago with acute confusion.  According to the family, she had been normal before this.  I reviewed the record after nursing called me.  She was nauseated last night and had no IV access, so I authorized promethazine 25 mg per rectum and then she began to be more agitated after that this morning. There is also a question of her mouth drooping, so she was put on a stroke protocol.  According to Dr. Juanetta Gosling' notes, that if she has been constipated in the past, she ends up with confusion, very similar to how she is now.  It is thought that she might also have a urinary tract infection, because of abnormal urine.  She was put on a fentanyl patch in September 2013, but apparently had not had a problem with this patch before this.  OBJECTIVE:  She is supine in bed.  She is in the intensive care unit. VITAL SIGNS:  Temperature is 99.2, pulse 104, respiratory rate 21, blood pressure 197/108 even after metoprolol 5 mg IV. She looks to be alert, but she is confused.  She sort of remembers that her doctor's name is Juanetta Gosling, but thinks she is in [place].  She is not able to say a full sentence. HEART:  Her heart appears to have a regular rhythm with a rate of 100. LUNGS:  Her lungs are clear throughout.  She is moving air well. ABDOMEN:  Soft.  The nurses and I have looked for a fentanyl patch anywhere on her body surface and could not find one presently, even though it is in the medication list.  ASSESSMENT: 1. Acute confusion with possible worsening on promethazine. 2. Chronic pain  syndrome. 3. 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 4. Hyperlipidemia. 5. 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 6. Degenerative joint disease status post total knee replacement and     total hip arthroplasty.  PLAN:  She is off the fentanyl patch, as above.  I looked at her x-rays. There is no sign of constipation, although she has had this in the past.  We will make sure that she is having bowel movements, and given that her urine shows no growth now, we will stop the ciprofloxacin that has been ordered.  I reviewed her CT scan of the head done today, which did show generalized atrophy, but no obvious sign of a stroke.     Mila Homer. Sudie Bailey, M.D.     SDK/MEDQ  D:  06/30/2013  T:  06/30/2013  Job:  [id number]

## 2013-06-30 NOTE — Progress Notes (Signed)
CRITICAL VALUE ALERT  Critical value received:  Troponin 0.89  Date of notification:  7/27  Time of notification:  1710  Critical value read back:yes  Nurse who received alert:  M. Winona Legato, RN  MD notified (1st page):  Dr Sudie Bailey  Time of first page:  1700  MD notified (2nd page): Dr Sudie Bailey  Time of second page:  Responding MD:  Dr Sudie Bailey  Time MD responded:  1710   No new orders received at this time. Will continue to monitor.

## 2013-06-30 NOTE — Progress Notes (Signed)
Upon awakening this am pt was agitated confused unable to swallow Attempts where made to orient pt without success I spoke with Dr Sudie Bailey about pt confusion and hypertension BP198/104 HR104. This time was 0815 prior the pt was asleep.  At 0900 code stroke was called because pt continued to be agitated and confused  BP at this time was 201/106 HR104  [facility] winny Earvin Hansen was in attendance by this time and IV access was unsuccessful labe was unable to draw blood Dr Sudie Bailey was notified that code stroke was called at this time pt had som slight droop to left side of mouth Dr Lovell Sheehan was called to place IV access and labs where obtained.  Dr Sudie Bailey was notified of continued hypertension and lopressor 5 mg IV was given and flushed.  Pt transported to CT after at 1000  A Bed request for ICU was initiated

## 2013-06-30 NOTE — Progress Notes (Signed)
RAPID RESPONSE/CODE STROKE CALLED ON PT. SX WERE ALTERED MENTAL STATUS,LT SIDE FACIAL DROOPING.BP 201/106. NO 8IV ACCESS. O2 STARTED AT 2L/MIN VIA [place] AND O2 SATS 96%. UNABLE TO OBTAIN IV ACCESS OR DRAW CODE STROKE PROTOCOL LABS. DR Lovell Sheehan CLLED TO COME AND INSERT CENTRAL LINE. CT NOTIFIED OF NEED FOR STAT HEAD CT. PT AROUSED AND MOVING ALL EXTREMITI3ES. SPEAK IS SLOW BUT UNDERSTANDABLE.[name]

## 2013-07-01 ENCOUNTER — Inpatient Hospital Stay (HOSPITAL_COMMUNITY): Payer: PRIVATE HEALTH INSURANCE

## 2013-07-01 ENCOUNTER — Encounter (HOSPITAL_COMMUNITY): Payer: PRIVATE HEALTH INSURANCE

## 2013-07-01 ENCOUNTER — Other Ambulatory Visit (HOSPITAL_COMMUNITY): Payer: PRIVATE HEALTH INSURANCE

## 2013-07-01 DIAGNOSIS — I7090 Unspecified atherosclerosis: Secondary | ICD-10-CM

## 2013-07-01 DIAGNOSIS — I517 Cardiomegaly: Secondary | ICD-10-CM

## 2013-07-01 DIAGNOSIS — I214 Non-ST elevation (NSTEMI) myocardial infarction: Secondary | ICD-10-CM

## 2013-07-01 DIAGNOSIS — Z950 Presence of cardiac pacemaker: Secondary | ICD-10-CM

## 2013-07-01 DIAGNOSIS — I5032 Chronic diastolic (congestive) heart failure: Secondary | ICD-10-CM

## 2013-07-01 DIAGNOSIS — I2510 Atherosclerotic heart disease of native coronary artery without angina pectoris: Secondary | ICD-10-CM

## 2013-07-01 LAB — HOMOCYSTEINE: Homocysteine: 12.6 umol/L (ref 4.0–15.4)

## 2013-07-01 LAB — GLUCOSE, CAPILLARY: Glucose-Capillary: 126 mg/dL — ABNORMAL HIGH (ref 70–99)

## 2013-07-01 LAB — BASIC METABOLIC PANEL
BUN: 12 mg/dL (ref 6–23)
CO2: 26 mEq/L (ref 19–32)
Calcium: 9.8 mg/dL (ref 8.4–10.5)
Chloride: 99 mEq/L (ref 96–112)
Creatinine, Ser: 1.19 mg/dL — ABNORMAL HIGH (ref 0.50–1.10)
GFR calc Af Amer: 51 mL/min — ABNORMAL LOW (ref >90)
GFR calc non Af Amer: 44 mL/min — ABNORMAL LOW (ref >90)
Glucose, Bld: 135 mg/dL — ABNORMAL HIGH (ref 70–99)
Potassium: 2.7 mEq/L — CL (ref 3.5–5.1)
Sodium: 137 mEq/L (ref 135–145)

## 2013-07-01 LAB — CBC WITH DIFFERENTIAL/PLATELET
Basophils Absolute: 0 10*3/uL (ref 0.0–0.1)
Basophils Relative: 0 % (ref 0–1)
Eosinophils Absolute: 0 10*3/uL (ref 0.0–0.7)
Eosinophils Relative: 0 % (ref 0–5)
HCT: 31 % — ABNORMAL LOW (ref 36.0–46.0)
Hemoglobin: 10.2 g/dL — ABNORMAL LOW (ref 12.0–15.0)
Lymphocytes Relative: 10 % — ABNORMAL LOW (ref 12–46)
Lymphs Abs: 1.7 10*3/uL (ref 0.7–4.0)
MCH: 28.1 pg (ref 26.0–34.0)
MCHC: 32.9 g/dL (ref 30.0–36.0)
MCV: 85.4 fL (ref 78.0–100.0)
Monocytes Absolute: 1.6 10*3/uL — ABNORMAL HIGH (ref 0.1–1.0)
Monocytes Relative: 10 % (ref 3–12)
Neutro Abs: 13.2 10*3/uL — ABNORMAL HIGH (ref 1.7–7.7)
Neutrophils Relative %: 80 % — ABNORMAL HIGH (ref 43–77)
Platelets: 237 10*3/uL (ref 150–400)
RBC: 3.63 MIL/uL — ABNORMAL LOW (ref 3.87–5.11)
RDW: 14.8 % (ref 11.5–15.5)
WBC: 16.5 10*3/uL — ABNORMAL HIGH (ref 4.0–10.5)

## 2013-07-01 LAB — PHENYTOIN LEVEL, TOTAL: Phenytoin Lvl: 2.5 ug/mL — ABNORMAL LOW (ref 10.0–20.0)

## 2013-07-01 MED ORDER — SODIUM CHLORIDE 0.9 % IJ SOLN
10.0000 mL | Freq: Two times a day (BID) | INTRAMUSCULAR | Status: DC
  Administered 2013-07-01: 10 mL

## 2013-07-01 MED ORDER — POTASSIUM CHLORIDE 10 MEQ/100ML IV SOLN
10.0000 meq | INTRAVENOUS | Status: AC
  Administered 2013-07-01 (×4): 10 meq via INTRAVENOUS
  Filled 2013-07-01: qty 400

## 2013-07-01 MED ORDER — ENOXAPARIN SODIUM 40 MG/0.4ML ~~LOC~~ SOLN
40.0000 mg | SUBCUTANEOUS | Status: DC
  Administered 2013-07-02 – 2013-07-03 (×2): 40 mg via SUBCUTANEOUS
  Filled 2013-07-01 (×2): qty 0.4

## 2013-07-01 MED ORDER — POTASSIUM CHLORIDE CRYS ER 10 MEQ PO TBCR
20.0000 meq | EXTENDED_RELEASE_TABLET | Freq: Every day | ORAL | Status: DC
  Administered 2013-07-01 – 2013-07-02 (×2): 20 meq via ORAL
  Filled 2013-07-01 (×5): qty 2

## 2013-07-01 MED ORDER — METFORMIN HCL 500 MG PO TABS
500.0000 mg | ORAL_TABLET | Freq: Two times a day (BID) | ORAL | Status: DC
  Administered 2013-07-02 – 2013-07-03 (×4): 500 mg via ORAL
  Filled 2013-07-01 (×4): qty 1

## 2013-07-01 MED ORDER — SODIUM CHLORIDE 0.9 % IJ SOLN: 10.0000 mL | INTRAMUSCULAR | Status: DC | PRN

## 2013-07-01 NOTE — Consult Note (Signed)
CARDIOLOGY CONSULT NOTE  Patient ID: [name] MRN: [id number] DOB/AGE: Jan 21, 1941 72 y.o.  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 She was admitted on 06/27/2013 with acute confusion and abdominal pain. Cardiac enzymes were found to be positive in the absence of chest pain (0.43, 0.89; 0.62; 0.41 respectively) with new lateral ST /T abnormalities by EKG.  Review of other labs demonstrate hypokalemia this am of 2.7, Na of 137, creatinine of 1.19. She has elevated WBC's of 16. 5. She is afebrile. She is exhibiting evidence of CVA with some left sided facial drooping. CT scan this admssion demonstrated no acute intracranial pathology. Chronic ischemic changes and atrophy are noted.  She was last seen by Dr. Ladona Ridgel for PPM check on 02/14/2013 demonstrating normal function.   Review of systems complete and found to be negative unless listed above   Past Medical History  Diagnosis Date  . Diabetes mellitus, type II   . Hypertension   . Arteriosclerotic cardiovascular disease (ASCVD)     multivessel, CABG 6/10.  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  Myoview (11/20/11) showed no ischemia & EF 62%  . Gastroesophageal reflux disease     Hiatal hernia  . Hyperlipidemia   . Deep vein thrombosis, upper right extremity     post-PPM  . Obesity   . DDD (degenerative disc disease)   . Dehiscence of closure of sternum or sternotomy 07/2009    sternal infection; required flap closure  . COPD (chronic obstructive pulmonary disease)     severe lung disease by PFTs 6/12  . Sleep apnea   . Seizure disorder   . AV block 1993    s/p  dual-chamber PPM, 1993, Medtronic Kappa; generator change-2003     Family History  Problem Relation Age of Onset  . Diabetes    . Heart disease Mother     MI  . Coronary artery disease      Female <55  . Cancer    . Colon cancer Father     age 2    History   Social History  . Marital Status: Married    Spouse Name: N/A    Number of Children: N/A  . Years of Education: N/A   Occupational History  . Not on file.   Social History Main Topics  . Smoking status: Never Smoker   . Smokeless tobacco: Never Used  . Alcohol Use: No  . Drug Use: No  . Sexually Active: Not Currently    Birth Control/ Protection: Post-menopausal, Surgical   Other Topics Concern  . Not on file   Social History Narrative  . No narrative on file    Past Surgical History  Procedure Laterality Date  . Pacemaker placement  1993    Status post DDD pacemaker implantation in 1993, with Medtronic Kappa generator change in 2003 for  second-degree AV block, most recent gen change by Fawn Kirk 04/14/11  . Revision total hip arthroplasty    . Replacement total knee      Left  . Thyroid surgery    . Wrist surgery    . Coronary artery bypass graft  05/1999 and    - LIMA to LAD, SVG  to OM, SVG to PDA  . Reconstructive repair sternal      for infection s/p CABG 8/10  . Back surgery    . Esophagogastroduodenoscopy      with [facility] dilation,  biopsy, and disruption Schatzki's ring  . Colonoscopy  04/27/2005    hemorrhoids, diverticula  . Esophagogastroduodenoscopy  09/10/2012    RMR: Noncritical Schatzki's ring. Diffuse gastric erosions and an  area of partially healing ulceration-status post biopsy/ Small hiatal hernia. Bx reactive gastropathy.  . Abdominal hysterectomy    . Colonoscopy with esophagogastroduodenoscopy (egd) N/A 02/20/2013    RMR: pancolonic diverticulosis and internal hemorrhoids. EGD with small hiatal hernia, healed gastric ulcer     Prescriptions prior to admission  Medication Sig Dispense  Refill  . amLODipine (NORVASC) 10 MG tablet Take 10 mg by mouth daily.      Marland Kitchen aspirin EC 81 MG tablet Take 81 mg by mouth daily.      Marland Kitchen atorvastatin (LIPITOR) 40 MG tablet Take 40 mg by mouth daily.      . Bromfenac Sodium (BROMDAY) 0.09 % SOLN Place 1 drop into the right eye 3 (three) times daily.      Marland Kitchen doxepin (SINEQUAN) 25 MG capsule Take 25 mg by mouth at bedtime.      Marland Kitchen estradiol (ESTRACE) 0.5 MG tablet Take 0.5 mg by mouth daily.        . fentaNYL (DURAGESIC - DOSED MCG/HR) 100 MCG/HR Place 1 patch onto the skin every 3 (three) days.      . magnesium hydroxide (MILK OF MAGNESIA) 400 MG/5ML suspension Takes couple of tablespoons daily.      . metFORMIN (GLUCOPHAGE) 500 MG tablet Take 500 mg by mouth 2 (two) times daily with a meal.       . metoprolol tartrate (LOPRESSOR) 25 MG tablet Take 12.5 mg by mouth 2 (two) times daily.      . pantoprazole (PROTONIX) 40 MG tablet Take 1 tablet (40 mg total) by mouth 2 (two) times daily.  60 tablet  11  . phenytoin (DILANTIN) 100 MG ER capsule Take 300 mg by mouth at bedtime.        . potassium chloride (K-DUR) 10 MEQ tablet Take 2 tablets (20 mEq total) by mouth daily.  60 tablet  3  . senna-docusate (SENOKOT-S) 8.6-50 MG per tablet Take 1 tablet by mouth daily as needed for constipation.       . traMADol (ULTRAM) 50 MG tablet Take 50 mg by mouth every 6 (six) hours as needed for pain.      . hydrALAZINE (APRESOLINE) 25 MG tablet Take 25 mg by mouth every 8 (eight) hours.      . nitroGLYCERIN (NITROSTAT) 0.4 MG SL tablet Place 0.4 mg under the tongue every 5 (five) minutes as needed for chest pain.        Echocardiogram: (01/13/2010) Left ventricle: The cavity size was normal. Wall thickness was increased increased in a pattern of mild to moderate LVH. Systolic function was vigorous. The estimated ejection fraction was in the range of 65% to 70%. Wall motion was normal; there were no regional wall motion abnormalities. - Mitral valve: Mildly  calcified annulus. - Right ventricle: The cavity size was normal. Wall thickness was mildly increased.  Physical Exam: Blood pressure 182/73, pulse 94, temperature 98.9 F (37.2 C), temperature source Oral, resp. rate 22, height 5\' 2"  (1.575 m), weight 195 lb 5.2 oz (88.6 kg), SpO2 100.00%.  General: No acute distress, difficult historian  Head: Eyes PERRLA, No xanthomas.   Normal cephalic and atramatic  Lungs: Clear bilaterally to auscultation with poor inspiratory effort. Heart: HRRR S1 S2, soft systolic murmur,.  Pulses are 2+ & equal.            Soft right carotid bruit. No JVD.  No abdominal bruits.  Abdomen: Bowel sounds are positive, abdomen soft and non-tender without masses or                  Hernia's noted. Msk:  Back normal,. Normal strength equal hand grips and able to move all extremities.  Extremities: No clubbing, cyanosis or edema.  DP +1 Neuro: Alert and oriented X 3.Difficult memory of events leading to admission. Psych:  Good affect, responds slowly.   Labs:   Lab Results  Component Value Date   WBC 16.5* 07/01/2013   HGB 10.2* 07/01/2013   HCT 31.0* 07/01/2013   MCV 85.4 07/01/2013   PLT 237 07/01/2013    Recent Labs Lab 06/29/13 0152  07/01/13 0438  NA 140  < > 137  K 3.3*  < > 2.7*  CL 104  < > 99  CO2 27  < > 26  BUN 11  < > 12  CREATININE 0.98  < > 1.19*  CALCIUM 10.2  < > 9.8  PROT 7.9  --   --   BILITOT 0.3  --   --   ALKPHOS 100  --   --   ALT 18  --   --   AST 25  --   --   GLUCOSE 118*  < > 135*  < > = values in this interval not displayed. Lab Results  Component Value Date   CKTOTAL 42 03/24/2012   CKMB 1.5 03/24/2012   TROPONINI 0.41* 07/01/2013       Radiology: Ct Head Wo Contrast  06/30/2013   *RADIOLOGY REPORT*  Clinical Data: Stroke  CT HEAD WITHOUT CONTRAST  Technique:  Contiguous axial images were obtained from the base of the skull through the vertex without contrast.  Comparison: 06/28/2013  Findings: Mild chronic ischemic  changes in the periventricular white matter.  Mild global atrophy appropriate to age.  No mass effect, midline shift, or acute intracranial hemorrhage.  Cranium is intact.  Mastoid air cells are clear.  IMPRESSION: No acute intracranial pathology.  Chronic ischemic changes and atrophy are noted.   Original Report Authenticated By: Jolaine Click, M.D.   EKG: Normal sinus rhythm with sinus arrhythmia,T wave abnormality,anterior laterally rate of 84 bpm. Unchanged from EKG in 2013.  ASSESSMENT AND PLAN:   1. Abnormal troponin: Possible type 2 NSTEMI. Symptomatically she presented with abdominal pain and confusional state, now being assessed by neurology. Will have echo completed for LV fx as last study was 2011. Continue ASA, metoprolol and statin.   2. Hypertension: Currently not well controlled on review of BP during hospitalization ranging in the 140's to 180's systolic. Increase metoprolol to home dose of 25 mg BID from 12.5 mg BID. Continue hydralazine and amlodipine. She may benefit from addition of ACE/ARB if BP is not well controlled with BB increase, in the setting of diabetes..  3. S/P PPM: Most recently interrogated in March of 2014 and found to be functioning appropriately.   4. Acute confusion: Neuro consulted. CT scan negative for CVA. She has a history of seizure disorder on dilantin. Check level.  5. Hypokalemia: She is now being relpeated. Follow up labs in am.  Signed: Bettey Mare.  Lyman Bishop NP [facility] 07/01/2013, 8:47 AM Co-Sign MD   Attending note:  Patient seen and examined. Reviewed available records and modified above note by Ms. Lawrence NP. Patient presents with confusional state also complained of abdominal discomfort, now resolved. She is undergoing neurological workup for CNS event, no definite stroke as yet determined. She has not had any chest pain, although cardiac markers were obtained with mild elevation in troponin I as noted above, ECG also showing  transient ST-T wave abnormalities in the lateral leads with atrial pacing at baseline. She has had no rapid arrhythmias by telemetry, otherwise hemodynamically stable other than suboptimally controlled blood pressure. History includes multivessel CAD status post CABG in 2010. Last ischemic workup was via Myoview in December 2012 demonstrating no active ischemic defects with LVEF 62%. Our plan will be to check an echocardiogram to reevaluate LV function and exclude any new wall motion abnormalities. Otherwise continue medical therapy for CAD. In the absence of chest pain symptoms, further clinical decline, or significant change in LV function, she will likely not require further ischemic workup at this time - can reassess with close outpatient followup. Our service will follow with you.  Jonelle Sidle, M.D., F.A.C.C.

## 2013-07-01 NOTE — Progress Notes (Signed)
Subjective: After I saw her yesterday she continued to have confusion and agitation. There was some problem with what seemed to be some facial drooping. She has a seizure disorder but no definite seizure activity was noted. This morning she is almost back to normal. She is awake and alert says she knows where she is but seems mildly confused.  Objective: Vital signs in last 24 hours: Temp:  [98.7 F (37.1 C)-99.9 F (37.7 C)] 98.9 F (37.2 C) (07/28 0000) Pulse Rate:  [65-104] 94 (07/27 2000) Resp:  [17-29] 22 (07/28 0500) BP: (100-201)/(43-125) 144/125 mmHg (07/28 0400) SpO2:  [94 %-100 %] 100 % (07/27 2000) Weight:  [86.6 kg (190 lb 14.7 oz)-88.6 kg (195 lb 5.2 oz)] 88.6 kg (195 lb 5.2 oz) (07/28 0500) Weight change:  Last BM Date: 06/29/13  Intake/Output from previous day: 07/27 0701 - 07/28 0700 In: 2670 [P.O.:720; I.V.:1950] Out: -   PHYSICAL EXAM General appearance: alert, cooperative, mild distress and morbidly obese Resp: clear to auscultation bilaterally Cardio: regular rate and rhythm, S1, S2 normal, no murmur, click, rub or gallop GI: soft, non-tender; bowel sounds normal; no masses,  no organomegaly Extremities: extremities normal, atraumatic, no cyanosis or edema  Lab Results:    Basic Metabolic Panel:  Recent Labs  16/10/96 0152 06/30/13 0949 07/01/13 0438  NA 140 138 137  K 3.3* 3.0* 2.7*  CL 104 97 99  CO2 27 23 26   GLUCOSE 118* 163* 135*  BUN 11 8 12   CREATININE 0.98 0.92 1.19*  CALCIUM 10.2 10.8* 9.8  MG 1.7  --   --    Liver Function Tests:  Recent Labs  06/28/13 1554 06/29/13 0152  AST 28 25  ALT 21 18  ALKPHOS 103 100  BILITOT 0.3 0.3  PROT 8.2 7.9  ALBUMIN 4.2 3.9    Recent Labs  06/28/13 1554  LIPASE 58   No results found for this basename: AMMONIA,  in the last 72 hours CBC:  Recent Labs  06/28/13 1554  06/30/13 0949 07/01/13 0438  WBC 8.7  < > 12.9* 16.5*  NEUTROABS 6.0  --   --  13.2*  HGB 10.0*  < > 11.4* 10.2*   HCT 30.2*  < > 33.2* 31.0*  MCV 86.3  < > 84.3 85.4  PLT 233  < > 280 237  < > = values in this interval not displayed. Cardiac Enzymes:  Recent Labs  06/30/13 1603 06/30/13 2150 07/01/13 0437  TROPONINI 0.89* 0.62* 0.41*   BNP: No results found for this basename: PROBNP,  in the last 72 hours D-Dimer: No results found for this basename: DDIMER,  in the last 72 hours CBG:  Recent Labs  06/29/13 2126 06/30/13 0808 06/30/13 0914 06/30/13 1041 06/30/13 1644 06/30/13 2114  GLUCAP 137* 154* 151* 145* 156* 138*   Hemoglobin A1C:  Recent Labs  06/29/13 0152  HGBA1C 5.8*   Fasting Lipid Panel: No results found for this basename: CHOL, HDL, LDLCALC, TRIG, CHOLHDL, LDLDIRECT,  in the last 72 hours Thyroid Function Tests:  Recent Labs  06/29/13 0152  TSH 1.121   Anemia Panel: No results found for this basename: VITAMINB12, FOLATE, FERRITIN, TIBC, IRON, RETICCTPCT,  in the last 72 hours Coagulation:  Recent Labs  06/29/13 0152 06/30/13 0949  LABPROT 14.6 14.4  INR 1.16 1.14   Urine Drug Screen: Drugs of Abuse  No results found for this basename: labopia, cocainscrnur, labbenz, amphetmu, thcu, labbarb    Alcohol Level: No results found for this  basename: ETH,  in the last 72 hours Urinalysis:  Recent Labs  06/28/13 1746  COLORURINE YELLOW  LABSPEC 1.020  PHURINE 6.0  GLUCOSEU NEGATIVE  HGBUR MODERATE*  BILIRUBINUR NEGATIVE  KETONESUR NEGATIVE  PROTEINUR NEGATIVE  UROBILINOGEN 0.2  NITRITE NEGATIVE  LEUKOCYTESUR NEGATIVE   Misc. Labs:  ABGS No results found for this basename: PHART, PCO2, PO2ART, TCO2, HCO3,  in the last 72 hours CULTURES Recent Results (from the past 240 hour(s))  URINE CULTURE     Status: None   Collection Time    06/28/13  5:46 PM      Result Value Range Status   Specimen Description URINE, CATHETERIZED   Final   Special Requests NONE   Final   Culture  Setup Time 06/29/2013 00:26   Final   Colony Count NO GROWTH    Final   Culture NO GROWTH   Final   Report Status 06/30/2013 FINAL   Final   Studies/Results: Ct Head Wo Contrast  06/30/2013   *RADIOLOGY REPORT*  Clinical Data: Stroke  CT HEAD WITHOUT CONTRAST  Technique:  Contiguous axial images were obtained from the base of the skull through the vertex without contrast.  Comparison: 06/28/2013  Findings: Mild chronic ischemic changes in the periventricular white matter.  Mild global atrophy appropriate to age.  No mass effect, midline shift, or acute intracranial hemorrhage.  Cranium is intact.  Mastoid air cells are clear.  IMPRESSION: No acute intracranial pathology.  Chronic ischemic changes and atrophy are noted.   Original Report Authenticated By: Jolaine Click, M.D.    Medications:  Prior to Admission:  Prescriptions prior to admission  Medication Sig Dispense Refill  . amLODipine (NORVASC) 10 MG tablet Take 10 mg by mouth daily.      Marland Kitchen aspirin EC 81 MG tablet Take 81 mg by mouth daily.      Marland Kitchen atorvastatin (LIPITOR) 40 MG tablet Take 40 mg by mouth daily.      . Bromfenac Sodium (BROMDAY) 0.09 % SOLN Place 1 drop into the right eye 3 (three) times daily.      Marland Kitchen doxepin (SINEQUAN) 25 MG capsule Take 25 mg by mouth at bedtime.      Marland Kitchen estradiol (ESTRACE) 0.5 MG tablet Take 0.5 mg by mouth daily.        . fentaNYL (DURAGESIC - DOSED MCG/HR) 100 MCG/HR Place 1 patch onto the skin every 3 (three) days.      . magnesium hydroxide (MILK OF MAGNESIA) 400 MG/5ML suspension Takes couple of tablespoons daily.      . metFORMIN (GLUCOPHAGE) 500 MG tablet Take 500 mg by mouth 2 (two) times daily with a meal.       . metoprolol tartrate (LOPRESSOR) 25 MG tablet Take 12.5 mg by mouth 2 (two) times daily.      . pantoprazole (PROTONIX) 40 MG tablet Take 1 tablet (40 mg total) by mouth 2 (two) times daily.  60 tablet  11  . phenytoin (DILANTIN) 100 MG ER capsule Take 300 mg by mouth at bedtime.        . potassium chloride (K-DUR) 10 MEQ tablet Take 2 tablets (20 mEq  total) by mouth daily.  60 tablet  3  . senna-docusate (SENOKOT-S) 8.6-50 MG per tablet Take 1 tablet by mouth daily as needed for constipation.       . traMADol (ULTRAM) 50 MG tablet Take 50 mg by mouth every 6 (six) hours as needed for pain.      Marland Kitchen  hydrALAZINE (APRESOLINE) 25 MG tablet Take 25 mg by mouth every 8 (eight) hours.      . nitroGLYCERIN (NITROSTAT) 0.4 MG SL tablet Place 0.4 mg under the tongue every 5 (five) minutes as needed for chest pain.        Scheduled: . amLODipine  10 mg Oral Daily  . aspirin EC  81 mg Oral Daily  . atorvastatin  40 mg Oral QPC supper  . enoxaparin (LOVENOX) injection  30 mg Subcutaneous Q24H  . estradiol  0.5 mg Oral Daily  . hydrALAZINE  25 mg Oral Q8H  . insulin aspart  0-5 Units Subcutaneous QHS  . insulin aspart  0-9 Units Subcutaneous TID WC  . ketorolac  1 drop Right Eye QID  . metFORMIN  500 mg Oral BID WC  . metoprolol tartrate  12.5 mg Oral BID  . pantoprazole  40 mg Oral BID  . phenytoin  300 mg Oral QHS  . potassium chloride  10 mEq Intravenous Q1 Hr x 4  . sodium chloride  3 mL Intravenous Q12H   Continuous: . sodium chloride 100 mL/hr at 07/01/13 0500  . 0.9 % NaCl with KCl 20 mEq / L 50 mL/hr at 06/29/13 0143   ZOX:[id number], diazepam, magnesium hydroxide, metoprolol, nitroGLYCERIN, ondansetron (ZOFRAN) IV, promethazine, traZODone  Assesment: She was admitted with acute delirium. This became worse and there was a question of a stroke. She is better this morning. She is hypokalemic this morning. She has an elevated troponin and does have a history of coronary artery occlusive disease. She's also had a pacemaker. Her EKG shows what looks like ischemia. Active Problems:   Chronic diastolic heart failure   Diabetes mellitus, type II   Hypertension   Obesity   COPD (chronic obstructive pulmonary disease)   Osteoarthritis of left hip   Acute delirium   Unspecified constipation    Plan: She will have potassium replacement.  She will have neurology consultation. She will have cardiology consultation.    LOS: 3 days   Dat Derksen L 07/01/2013, 7:43 AM

## 2013-07-01 NOTE — Progress Notes (Signed)
Critical potassium called by lab 2.7 MD on call paged. Awaiting response at this time

## 2013-07-01 NOTE — Progress Notes (Signed)
Occupational Therapy Screen  OT orders received. Patient's chart reviewed. Patient is alert and oriented at this time. Patient does not voice any concerns for returning home as she has good family support. Patient is independent with assistive devices. No further acute OT needs at this time; will sign off.   Limmie Patricia, OTR/L,CBIS  07/01/13 1:32PM

## 2013-07-01 NOTE — Progress Notes (Signed)
Subjective: This is a progress note from the 27th. She is more confused this morning. She has lost IV access and had to get Phenergan suppository. No other new problems are seen  Objective: Vital signs in last 24 hours: Temp:  [98.7 F (37.1 C)-99.9 F (37.7 C)] 98.9 F (37.2 C) (07/28 0000) Pulse Rate:  [65-104] 94 (07/27 2000) Resp:  [17-29] 22 (07/28 0500) BP: (100-201)/(43-125) 144/125 mmHg (07/28 0400) SpO2:  [94 %-100 %] 100 % (07/27 2000) Weight:  [86.6 kg (190 lb 14.7 oz)-88.6 kg (195 lb 5.2 oz)] 88.6 kg (195 lb 5.2 oz) (07/28 0500) Weight change:  Last BM Date: 06/29/13  Intake/Output from previous day: 07/27 0701 - 07/28 0700 In: 2670 [P.O.:720; I.V.:1950] Out: -   PHYSICAL EXAM General appearance: Confused but neurologically intact Resp: clear to auscultation bilaterally Cardio: regular rate and rhythm, S1, S2 normal, no murmur, click, rub or gallop GI: soft, non-tender; bowel sounds normal; no masses,  no organomegaly Extremities: extremities normal, atraumatic, no cyanosis or edema  Lab Results:    Basic Metabolic Panel:  Recent Labs  65/78/46 0152 06/30/13 0949 07/01/13 0438  NA 140 138 137  K 3.3* 3.0* 2.7*  CL 104 97 99  CO2 27 23 26   GLUCOSE 118* 163* 135*  BUN 11 8 12   CREATININE 0.98 0.92 1.19*  CALCIUM 10.2 10.8* 9.8  MG 1.7  --   --    Liver Function Tests:  Recent Labs  06/28/13 1554 06/29/13 0152  AST 28 25  ALT 21 18  ALKPHOS 103 100  BILITOT 0.3 0.3  PROT 8.2 7.9  ALBUMIN 4.2 3.9    Recent Labs  06/28/13 1554  LIPASE 58   No results found for this basename: AMMONIA,  in the last 72 hours CBC:  Recent Labs  06/28/13 1554  06/30/13 0949 07/01/13 0438  WBC 8.7  < > 12.9* 16.5*  NEUTROABS 6.0  --   --  13.2*  HGB 10.0*  < > 11.4* 10.2*  HCT 30.2*  < > 33.2* 31.0*  MCV 86.3  < > 84.3 85.4  PLT 233  < > 280 237  < > = values in this interval not displayed. Cardiac Enzymes:  Recent Labs  06/30/13 1603  06/30/13 2150 07/01/13 0437  TROPONINI 0.89* 0.62* 0.41*   BNP: No results found for this basename: PROBNP,  in the last 72 hours D-Dimer: No results found for this basename: DDIMER,  in the last 72 hours CBG:  Recent Labs  06/29/13 2126 06/30/13 0808 06/30/13 0914 06/30/13 1041 06/30/13 1644 06/30/13 2114  GLUCAP 137* 154* 151* 145* 156* 138*   Hemoglobin A1C:  Recent Labs  06/29/13 0152  HGBA1C 5.8*   Fasting Lipid Panel: No results found for this basename: CHOL, HDL, LDLCALC, TRIG, CHOLHDL, LDLDIRECT,  in the last 72 hours Thyroid Function Tests:  Recent Labs  06/29/13 0152  TSH 1.121   Anemia Panel: No results found for this basename: VITAMINB12, FOLATE, FERRITIN, TIBC, IRON, RETICCTPCT,  in the last 72 hours Coagulation:  Recent Labs  06/29/13 0152 06/30/13 0949  LABPROT 14.6 14.4  INR 1.16 1.14   Urine Drug Screen: Drugs of Abuse  No results found for this basename: labopia, cocainscrnur, labbenz, amphetmu, thcu, labbarb    Alcohol Level: No results found for this basename: ETH,  in the last 72 hours Urinalysis:  Recent Labs  06/28/13 1746  COLORURINE YELLOW  LABSPEC 1.020  PHURINE 6.0  GLUCOSEU NEGATIVE  HGBUR MODERATE*  BILIRUBINUR NEGATIVE  KETONESUR NEGATIVE  PROTEINUR NEGATIVE  UROBILINOGEN 0.2  NITRITE NEGATIVE  LEUKOCYTESUR NEGATIVE   Misc. Labs:  ABGS No results found for this basename: PHART, PCO2, PO2ART, TCO2, HCO3,  in the last 72 hours CULTURES Recent Results (from the past 240 hour(s))  URINE CULTURE     Status: None   Collection Time    06/28/13  5:46 PM      Result Value Range Status   Specimen Description URINE, CATHETERIZED   Final   Special Requests NONE   Final   Culture  Setup Time 06/29/2013 00:26   Final   Colony Count NO GROWTH   Final   Culture NO GROWTH   Final   Report Status 06/30/2013 FINAL   Final   Studies/Results: Ct Head Wo Contrast  06/30/2013   *RADIOLOGY REPORT*  Clinical Data: Stroke   CT HEAD WITHOUT CONTRAST  Technique:  Contiguous axial images were obtained from the base of the skull through the vertex without contrast.  Comparison: 06/28/2013  Findings: Mild chronic ischemic changes in the periventricular white matter.  Mild global atrophy appropriate to age.  No mass effect, midline shift, or acute intracranial hemorrhage.  Cranium is intact.  Mastoid air cells are clear.  IMPRESSION: No acute intracranial pathology.  Chronic ischemic changes and atrophy are noted.   Original Report Authenticated By: Jolaine Click, M.D.    Medications:  Prior to Admission:  Prescriptions prior to admission  Medication Sig Dispense Refill  . amLODipine (NORVASC) 10 MG tablet Take 10 mg by mouth daily.      Marland Kitchen aspirin EC 81 MG tablet Take 81 mg by mouth daily.      Marland Kitchen atorvastatin (LIPITOR) 40 MG tablet Take 40 mg by mouth daily.      . Bromfenac Sodium (BROMDAY) 0.09 % SOLN Place 1 drop into the right eye 3 (three) times daily.      Marland Kitchen doxepin (SINEQUAN) 25 MG capsule Take 25 mg by mouth at bedtime.      Marland Kitchen estradiol (ESTRACE) 0.5 MG tablet Take 0.5 mg by mouth daily.        . fentaNYL (DURAGESIC - DOSED MCG/HR) 100 MCG/HR Place 1 patch onto the skin every 3 (three) days.      . magnesium hydroxide (MILK OF MAGNESIA) 400 MG/5ML suspension Takes couple of tablespoons daily.      . metFORMIN (GLUCOPHAGE) 500 MG tablet Take 500 mg by mouth 2 (two) times daily with a meal.       . metoprolol tartrate (LOPRESSOR) 25 MG tablet Take 12.5 mg by mouth 2 (two) times daily.      . pantoprazole (PROTONIX) 40 MG tablet Take 1 tablet (40 mg total) by mouth 2 (two) times daily.  60 tablet  11  . phenytoin (DILANTIN) 100 MG ER capsule Take 300 mg by mouth at bedtime.        . potassium chloride (K-DUR) 10 MEQ tablet Take 2 tablets (20 mEq total) by mouth daily.  60 tablet  3  . senna-docusate (SENOKOT-S) 8.6-50 MG per tablet Take 1 tablet by mouth daily as needed for constipation.       . traMADol (ULTRAM) 50  MG tablet Take 50 mg by mouth every 6 (six) hours as needed for pain.      . hydrALAZINE (APRESOLINE) 25 MG tablet Take 25 mg by mouth every 8 (eight) hours.      . nitroGLYCERIN (NITROSTAT) 0.4 MG SL tablet Place 0.4 mg under  the tongue every 5 (five) minutes as needed for chest pain.        Scheduled: . amLODipine  10 mg Oral Daily  . aspirin EC  81 mg Oral Daily  . atorvastatin  40 mg Oral QPC supper  . enoxaparin (LOVENOX) injection  30 mg Subcutaneous Q24H  . estradiol  0.5 mg Oral Daily  . hydrALAZINE  25 mg Oral Q8H  . insulin aspart  0-5 Units Subcutaneous QHS  . insulin aspart  0-9 Units Subcutaneous TID WC  . ketorolac  1 drop Right Eye QID  . metFORMIN  500 mg Oral BID WC  . metoprolol tartrate  12.5 mg Oral BID  . pantoprazole  40 mg Oral BID  . phenytoin  300 mg Oral QHS  . potassium chloride  10 mEq Intravenous Q1 Hr x 4  . sodium chloride  3 mL Intravenous Q12H   Continuous: . sodium chloride 100 mL/hr at 07/01/13 0500  . 0.9 % NaCl with KCl 20 mEq / L 50 mL/hr at 06/29/13 0143   RUE:[id number], diazepam, magnesium hydroxide, metoprolol, nitroGLYCERIN, ondansetron (ZOFRAN) IV, promethazine, traZODone  Assesment: She is more confused. She had done better yesterday and in fact yesterday I considered sending her home if she was doing that well this morning. Obviously if she's going to need to stay. She has lost IV access. Her confusion may be related to the rectal Phenergan and she had confusion when she came to the hospital. She has multiple other medical problems Active Problems:   Chronic diastolic heart failure   Diabetes mellitus, type II   Hypertension   Obesity   COPD (chronic obstructive pulmonary disease)   Osteoarthritis of left hip   Acute delirium   Unspecified constipation    Plan: I'm going to see if we can get a PICC line placed. Continue with her treatments.    LOS: 3 days   Sammi Stolarz L 07/01/2013, 7:41 AM

## 2013-07-01 NOTE — Progress Notes (Signed)
*  PRELIMINARY RESULTS* Echocardiogram 2D Echocardiogram has been performed.  [name] 07/01/2013, 11:53 AM 

## 2013-07-01 NOTE — Progress Notes (Signed)
UR chart review completed.  

## 2013-07-01 NOTE — Evaluation (Signed)
Physical Therapy Evaluation Patient Details Name: [name] MRN: [id number] DOB: February 12, 1941 Today's Date: 07/01/2013 Time: 4098-1191 PT Time Calculation (min): 32 min  PT Assessment / Plan / Recommendation History of Present Illness  Pt is admitted with altered mental status but has improved significantly since admission.  Today she is alert and oriented, however does have some dysfunctiona of memory (can't recall ever having been a pt here).  Clinical Impression   Pt was seen for evaluation and found to be at prior functional level.   She is alert and oriented today and very cooperative.  Her gait with a quad cane is stable.  She appears to have good family support.  No further PT should be needed.    PT Assessment  Patent does not need any further PT services    Follow Up Recommendations  No PT follow up    Does the patient have the potential to tolerate intense rehabilitation    no  Barriers to Discharge        Equipment Recommendations  None recommended by PT    Recommendations for Other Services     Frequency      Precautions / Restrictions Precautions Precautions: None Restrictions Weight Bearing Restrictions: No   Pertinent Vitals/Pain       Mobility  Bed Mobility Bed Mobility: Supine to Sit;Sit to Supine Supine to Sit: 4: Min assist;HOB elevated Sit to Supine: 4: Min guard;HOB flat Transfers Transfers: Sit to Stand;Stand to Sit Sit to Stand: 5: Supervision;From bed;With upper extremity assist Stand to Sit: 5: Supervision;To bed;With upper extremity assist Ambulation/Gait Ambulation/Gait Assistance: 5: Supervision Ambulation Distance (Feet): 45 Feet Assistive device: Large base quad cane Gait Pattern: Within Functional Limits Gait velocity: slow but stable Stairs: No Wheelchair Mobility Wheelchair Mobility: No    Exercises     PT Diagnosis:    PT Problem List:   PT Treatment Interventions:       PT Goals(Current goals can be found in the  care plan section) Acute Rehab PT Goals Patient Stated Goal: none stated PT Goal Formulation: No goals set, d/c therapy  Visit Information  Last PT Received On: 07/01/13 History of Present Illness: Pt is admitted with altered mental status but has improved significantly since admission.  Today she is alert and oriented, however does have some dysfunctiona of memory (can't recall ever having been a pt here).       Prior Functioning  Home Living Family/patient expects to be discharged to:: Private residence Living Arrangements: Spouse/significant other;Other relatives Available Help at Discharge: Family;Available 24 hours/day Type of Home: House Home Access: Ramped entrance Home Layout: One level Prior Function Level of Independence: Independent with assistive device(s) Communication Communication: No difficulties    Cognition  Cognition Arousal/Alertness: Awake/alert Behavior During Therapy: WFL for tasks assessed/performed Overall Cognitive Status: Within Functional Limits for tasks assessed Memory: Decreased short-term memory    Extremity/Trunk Assessment Upper Extremity Assessment Upper Extremity Assessment: Overall WFL for tasks assessed Lower Extremity Assessment Lower Extremity Assessment: Overall WFL for tasks assessed   Balance Balance Balance Assessed: No  End of Session PT - End of Session Equipment Utilized During Treatment: Gait belt Activity Tolerance: Patient tolerated treatment well Patient left: in bed Nurse Communication: Mobility status  GP     Konrad Penta 07/01/2013, 12:49 PM

## 2013-07-01 NOTE — H&P (Signed)
[facility] [name] A. Christine Pilgrim, MD     [email or web address]          [name] is an 72 y.o. female.   ASSESSMENT/PLAN: 1. Acute encephalopathy/altered mental status of unclear etiology. This appears to have improved dramatically overnight. The differential diagnosis includes seizures and ischemic infarcts. So far there appears to be no metabolic derangements explain the symptoms. Medication effect is always a possibility. An EEG will be obtained. Dementia labs will be obtained. Additionally, her Dilantin level will be checked.  2. Mild right hemiparesis worsened for subacute infarct/acute infarct. Brain MRI will be obtained. Carotid dopp.   This is a 72 year old black female who presents with a relatively acute onset of confusion and altered mental status. They apparently has a baseline history of mild cognitive impairment/mild dementia at baseline. The patient has had a initial workup which has been negative for any metabolic or toxic etiology. Imaging of the brain has been unremarkable. It appears that the patient has baseline cognitive impairment but clearly she appears to be worse over baseline. Interestingly, the patient appears to have improved significantly overnight. She proceeded was confused and also was picking at things and try to pull out different monitoring devices. The patient reports feeling well this morning. She does not report any focal numbness, weakness, headaches, dysarthria and dysphagia or shortness of breath. There is also no chest pain.  GENERAL: Pleasant overweight lady in no acute distress.  HEENT: Status post lower cervical incisional scar and also midline scar distressed. Otherwise unremarkable.  ABDOMEN: soft  EXTREMITIES: No edema. She has surgical incisional scar bilateral knees status post replacement. There is significant osteoarthritic changes of knees and extremities.   BACK: Unremarkable  SKIN: Normal by inspection.    MENTAL  STATUS: Alert and oriented 3. Speech, language and cognition are generally intact. Judgment and insight normal.   CRANIAL NERVES: Pupils are equal, round and reactive to light and accommodation; extra ocular movements are full, there is no significant nystagmus; visual fields are full; upper and lower facial muscles are normal in strength and symmetric, there is no flattening of the nasolabial folds; tongue is midline; uvula is midline; shoulder elevation is normal.  MOTOR: There is a moderate right hemiparesis. The right upper extremity is 4/5. Right lower extremity to/5. Bulk and tone are normal. The left side shows normal tone and bulk and strength.  COORDINATION: Left finger to nose is normal, right finger to nose is normal, No rest tremor; no intention tremor; no postural tremor; no bradykinesia.  REFLEXES: Deep tendon reflexes are symmetrical and normal. Plantar responses are flexor on the left but extensor on the right.   SENSATION: Normal to light touch and temperature.     Past Medical History  Diagnosis Date  . Diabetes mellitus, type II   . Hypertension   . Arteriosclerotic cardiovascular disease (ASCVD)     multivessel, CABG 6/10.  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  Myoview (11/20/11) showed no ischemia & EF 62%  . Gastroesophageal reflux disease     Hiatal hernia  . Hyperlipidemia   . Deep vein thrombosis, upper right extremity     post-PPM  . Obesity   . DDD (degenerative disc disease)   . Dehiscence of closure of sternum or sternotomy 07/2009    sternal infection; required flap closure  . COPD (chronic obstructive pulmonary disease)     severe lung disease by PFTs 6/12  . Sleep apnea   . Seizure disorder   . 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 Pacemaker placement  1993    Status post DDD pacemaker implantation in 1993, with Medtronic Kappa generator change in 2003 for   second-degree AV block, most recent gen change by Fawn Kirk 04/14/11  . Revision total hip arthroplasty    . Replacement total knee      Left  . Thyroid surgery    . Wrist surgery    . 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 Reconstructive repair sternal      for infection s/p CABG 8/10  . Back surgery    . Esophagogastroduodenoscopy      with [facility] dilation,  biopsy, and disruption Schatzki's ring  . Colonoscopy  04/27/2005    hemorrhoids, diverticula  . Esophagogastroduodenoscopy  09/10/2012    RMR: Noncritical Schatzki's ring. Diffuse gastric erosions and an  area of partially healing ulceration-status post biopsy/ Small hiatal hernia. Bx reactive gastropathy.  . Abdominal hysterectomy    . Colonoscopy with esophagogastroduodenoscopy (egd) N/A 02/20/2013    RMR: pancolonic diverticulosis and internal hemorrhoids. EGD with small hiatal hernia, healed gastric ulcer    Family History  Problem Relation Age of Onset  . Diabetes    . Heart disease Mother     MI  . Coronary artery disease      Female <55  . Cancer    . Colon cancer Father     age 57    Social History:  reports that she has never smoked. She has never used smokeless tobacco. She reports that she does not drink alcohol or use illicit drugs.  Allergies:  Allergies  Allergen Reactions  . Codeine     "good" headache  . Other     Leafy raw vegetables and seeds  . Oxycodone Hcl     "good" headache; also OxyContin  . Reglan (Metoclopramide)     CONFUSION    Medications: Prior to Admission medications   Medication Sig Start Date End Date Taking? Authorizing Provider  amLODipine (NORVASC) 10 MG tablet Take 10 mg by mouth daily. 11/21/11  Yes Dayna N Dunn, PA-C  aspirin EC 81 MG tablet Take 81 mg by mouth daily.   Yes Historical Provider, MD  atorvastatin (LIPITOR) 40 MG tablet Take 40 mg by mouth daily.   Yes Historical Provider, MD  Bromfenac Sodium (BROMDAY) 0.09 % SOLN  Place 1 drop into the right eye 3 (three) times daily.   Yes Historical Provider, MD  doxepin (SINEQUAN) 25 MG capsule Take 25 mg by mouth at bedtime.   Yes Historical Provider, MD  estradiol (ESTRACE) 0.5 MG tablet Take 0.5 mg by mouth daily.     Yes Historical Provider, MD  fentaNYL (DURAGESIC - DOSED MCG/HR) 100 MCG/HR Place 1 patch onto the skin every 3 (three) days.   Yes Historical Provider, MD  magnesium hydroxide (MILK OF MAGNESIA) 400 MG/5ML suspension Takes couple of tablespoons daily.   Yes Historical Provider, MD  metFORMIN (GLUCOPHAGE) 500 MG tablet Take 500 mg by mouth 2 (two) times daily with a meal.    Yes Historical Provider, MD  metoprolol tartrate (LOPRESSOR) 25 MG tablet Take 12.5 mg by mouth 2 (two) times daily.   Yes Historical Provider, MD  pantoprazole (PROTONIX) 40 MG tablet Take 1 tablet (40 mg total) by mouth 2 (two) times daily. 02/13/13  Yes Nira Retort, NP  phenytoin (DILANTIN) 100 MG ER capsule Take 300 mg by mouth at bedtime.     Yes Historical Provider, MD  potassium chloride (K-DUR) 10 MEQ tablet Take 2 tablets (20 mEq total) by mouth daily. 11/09/12  Yes Scott T Weaver, PA-C  senna-docusate (SENOKOT-S) 8.6-50 MG per tablet Take 1 tablet by mouth daily as needed for constipation.    Yes Historical Provider, MD  traMADol (ULTRAM) 50 MG tablet Take 50 mg by mouth every 6 (six) hours as needed for pain.   Yes Historical Provider, MD  hydrALAZINE (APRESOLINE) 25 MG tablet Take 25 mg by mouth every 8 (eight) hours. 11/21/11   Dayna N Dunn, PA-C  nitroGLYCERIN (NITROSTAT) 0.4 MG SL tablet Place 0.4 mg under the tongue every 5 (five) minutes as needed for chest pain.  11/21/11   Dayna N Dunn, PA-C    Scheduled Meds: . amLODipine  10 mg Oral Daily  . aspirin EC  81 mg Oral Daily  . atorvastatin  40 mg Oral QPC supper  . enoxaparin (LOVENOX) injection  30 mg Subcutaneous Q24H  . estradiol  0.5 mg Oral Daily  . hydrALAZINE  25 mg Oral Q8H  . insulin aspart  0-5 Units  Subcutaneous QHS  . insulin aspart  0-9 Units Subcutaneous TID WC  . ketorolac  1 drop Right Eye QID  . metFORMIN  500 mg Oral BID WC  . metoprolol tartrate  12.5 mg Oral BID  . pantoprazole  40 mg Oral BID  . phenytoin  300 mg Oral QHS  . potassium chloride  10 mEq Intravenous Q1 Hr x 4  . sodium chloride  3 mL Intravenous Q12H   Continuous Infusions: . sodium chloride 100 mL/hr at 07/01/13 0751  . 0.9 % NaCl with KCl 20 mEq / L 50 mL/hr at 06/29/13 0143   PRN Meds:.acetaminophen, diazepam, magnesium hydroxide, metoprolol, nitroGLYCERIN, ondansetron (ZOFRAN) IV, promethazine, traZODone   Blood pressure 144/125, pulse 94, temperature 98.9 F (37.2 C), temperature source Oral, resp. rate 22, height 5\' 2"  (1.575 m), weight 88.6 kg (195 lb 5.2 oz), SpO2 100.00%.   Results for orders placed during the hospital encounter of 06/28/13 (from the past 48 hour(s))  GLUCOSE, CAPILLARY     Status: Abnormal   Collection Time    06/29/13 11:31 AM      Result Value Range   Glucose-Capillary 127 (*) 70 - 99 mg/dL   Comment 1 Notify RN     Comment 2 Documented in Chart    GLUCOSE, CAPILLARY     Status: Abnormal   Collection Time    06/29/13  4:52 PM      Result Value Range   Glucose-Capillary 107 (*) 70 - 99 mg/dL   Comment 1 Notify RN    GLUCOSE, CAPILLARY     Status: Abnormal   Collection Time    06/29/13  9:26 PM      Result Value Range   Glucose-Capillary 137 (*) 70 - 99 mg/dL   Comment 1 Notify RN     Comment 2 Documented in Chart    GLUCOSE, CAPILLARY     Status: Abnormal   Collection Time    06/30/13  8:08 AM      Result Value Range   Glucose-Capillary 154 (*) 70 - 99 mg/dL   Comment 1 Notify RN    GLUCOSE, CAPILLARY     Status: Abnormal   Collection Time    06/30/13  9:14 AM  Result Value Range   Glucose-Capillary 151 (*) 70 - 99 mg/dL  CBC     Status: Abnormal   Collection Time    06/30/13  9:49 AM      Result Value Range   WBC 12.9 (*) 4.0 - 10.5 K/uL   RBC 3.94   3.87 - 5.11 MIL/uL   Hemoglobin 11.4 (*) 12.0 - 15.0 g/dL   HCT 40.1 (*) 02.7 - 25.3 %   MCV 84.3  78.0 - 100.0 fL   MCH 28.9  26.0 - 34.0 pg   MCHC 34.3  30.0 - 36.0 g/dL   RDW 66.4  40.3 - 47.4 %   Platelets 280  150 - 400 K/uL   Comment: DELTA CHECK NOTED  BASIC METABOLIC PANEL     Status: Abnormal   Collection Time    06/30/13  9:49 AM      Result Value Range   Sodium 138  135 - 145 mEq/L   Potassium 3.0 (*) 3.5 - 5.1 mEq/L   Chloride 97  96 - 112 mEq/L   CO2 23  19 - 32 mEq/L   Glucose, Bld 163 (*) 70 - 99 mg/dL   BUN 8  6 - 23 mg/dL   Creatinine, Ser 2.59  0.50 - 1.10 mg/dL   Calcium 56.3 (*) 8.4 - 10.5 mg/dL   GFR calc non Af Amer 60 (*) >90 mL/min   GFR calc Af Amer 69 (*) >90 mL/min   Comment:            The eGFR has been calculated     using the CKD EPI equation.     This calculation has not been     validated in all clinical     situations.     eGFR's persistently     <90 mL/min signify     possible Chronic Kidney Disease.  PROTIME-INR     Status: None   Collection Time    06/30/13  9:49 AM      Result Value Range   Prothrombin Time 14.4  11.6 - 15.2 seconds   INR 1.14  0.00 - 1.49  APTT     Status: None   Collection Time    06/30/13  9:49 AM      Result Value Range   aPTT 31  24 - 37 seconds  TROPONIN I     Status: Abnormal   Collection Time    06/30/13  9:49 AM      Result Value Range   Troponin I 0.43 (*) <0.30 ng/mL   Comment: CRITICAL RESULT CALLED TO, READ BACK BY AND VERIFIED WITH:     ROWE N. AT 1036A ON [id number] BY THOMPSON S.                Due to the release kinetics of cTnI,     a negative result within the first hours     of the onset of symptoms does not rule out     myocardial infarction with certainty.     If myocardial infarction is still suspected,     repeat the test at appropriate intervals.  GLUCOSE, CAPILLARY     Status: Abnormal   Collection Time    06/30/13 10:41 AM      Result Value Range   Glucose-Capillary 145 (*) 70 -  99 mg/dL  TROPONIN I     Status: Abnormal   Collection Time    06/30/13  4:03 PM  Result Value Range   Troponin I 0.89 (*) <0.30 ng/mL   Comment: CRITICAL VALUE NOTED.  VALUE IS CONSISTENT WITH PREVIOUSLY REPORTED AND CALLED VALUE.                Due to the release kinetics of cTnI,     a negative result within the first hours     of the onset of symptoms does not rule out     myocardial infarction with certainty.     If myocardial infarction is still suspected,     repeat the test at appropriate intervals.  GLUCOSE, CAPILLARY     Status: Abnormal   Collection Time    06/30/13  4:44 PM      Result Value Range   Glucose-Capillary 156 (*) 70 - 99 mg/dL  GLUCOSE, CAPILLARY     Status: Abnormal   Collection Time    06/30/13  9:14 PM      Result Value Range   Glucose-Capillary 138 (*) 70 - 99 mg/dL   Comment 1 Notify RN    TROPONIN I     Status: Abnormal   Collection Time    06/30/13  9:50 PM      Result Value Range   Troponin I 0.62 (*) <0.30 ng/mL   Comment: CRITICAL VALUE NOTED.  VALUE IS CONSISTENT WITH PREVIOUSLY REPORTED AND CALLED VALUE.                Due to the release kinetics of cTnI,     a negative result within the first hours     of the onset of symptoms does not rule out     myocardial infarction with certainty.     If myocardial infarction is still suspected,     repeat the test at appropriate intervals.  TROPONIN I     Status: Abnormal   Collection Time    07/01/13  4:37 AM      Result Value Range   Troponin I 0.41 (*) <0.30 ng/mL   Comment: CRITICAL RESULT CALLED TO, READ BACK BY AND VERIFIED WITH:     WAGONER,R AT 6:10AM ON 07/01/13 BY FESTERMAN,C                Due to the release kinetics of cTnI,     a negative result within the first hours     of the onset of symptoms does not rule out     myocardial infarction with certainty.     If myocardial infarction is still suspected,     repeat the test at appropriate intervals.  BASIC METABOLIC PANEL      Status: Abnormal   Collection Time    07/01/13  4:38 AM      Result Value Range   Sodium 137  135 - 145 mEq/L   Potassium 2.7 (*) 3.5 - 5.1 mEq/L   Comment: CRITICAL RESULT CALLED TO, READ BACK BY AND VERIFIED WITH:     WAGONER,R AT 6:15AM ON 07/01/13 BY FESTERMAN,C   Chloride 99  96 - 112 mEq/L   CO2 26  19 - 32 mEq/L   Glucose, Bld 135 (*) 70 - 99 mg/dL   BUN 12  6 - 23 mg/dL   Creatinine, Ser 3.08 (*) 0.50 - 1.10 mg/dL   Calcium 9.8  8.4 - 65.7 mg/dL   GFR calc non Af Amer 44 (*) >90 mL/min   GFR calc Af Amer 51 (*) >90 mL/min   Comment:  The eGFR has been calculated     using the CKD EPI equation.     This calculation has not been     validated in all clinical     situations.     eGFR's persistently     <90 mL/min signify     possible Chronic Kidney Disease.  CBC WITH DIFFERENTIAL     Status: Abnormal   Collection Time    07/01/13  4:38 AM      Result Value Range   WBC 16.5 (*) 4.0 - 10.5 K/uL   RBC 3.63 (*) 3.87 - 5.11 MIL/uL   Hemoglobin 10.2 (*) 12.0 - 15.0 g/dL   HCT 14.7 (*) 82.9 - 56.2 %   MCV 85.4  78.0 - 100.0 fL   MCH 28.1  26.0 - 34.0 pg   MCHC 32.9  30.0 - 36.0 g/dL   RDW 13.0  86.5 - 78.4 %   Platelets 237  150 - 400 K/uL   Neutrophils Relative % 80 (*) 43 - 77 %   Neutro Abs 13.2 (*) 1.7 - 7.7 K/uL   Lymphocytes Relative 10 (*) 12 - 46 %   Lymphs Abs 1.7  0.7 - 4.0 K/uL   Monocytes Relative 10  3 - 12 %   Monocytes Absolute 1.6 (*) 0.1 - 1.0 K/uL   Eosinophils Relative 0  0 - 5 %   Eosinophils Absolute 0.0  0.0 - 0.7 K/uL   Basophils Relative 0  0 - 1 %   Basophils Absolute 0.0  0.0 - 0.1 K/uL    Ct Head Wo Contrast  06/30/2013   *RADIOLOGY REPORT*  Clinical Data: Stroke  CT HEAD WITHOUT CONTRAST  Technique:  Contiguous axial images were obtained from the base of the skull through the vertex without contrast.  Comparison: 06/28/2013  Findings: Mild chronic ischemic changes in the periventricular white matter.  Mild global atrophy  appropriate to age.  No mass effect, midline shift, or acute intracranial hemorrhage.  Cranium is intact.  Mastoid air cells are clear.  IMPRESSION: No acute intracranial pathology.  Chronic ischemic changes and atrophy are noted.   Original Report Authenticated By: Jolaine Click, M.D.        Buna Cuppett A. [name], M.D.  Diplomate, Biomedical engineer of Psychiatry and Neurology ( Neurology). 07/01/2013, 8:19 AM

## 2013-07-01 NOTE — Plan of Care (Signed)
Problem: Consults Goal: General Medical Patient Education See Patient Education Module for specific education.  Outcome: Progressing  Patient admitted on the 26th with confusion, Neurology and cardiology teams have since seen her, she has no acute bleeding.  And today was back to baseline. Goal: Skin Care Protocol Initiated - if Braden Score 18 or less If consults are not indicated, leave blank or document N/A  Outcome: Progressing Patient is ambulatory with her 4 pronged cane and using the commode frequently,  Has not had an incontinent episode tonight.  Toileted 3 x's  Problem: Phase I Progression Outcomes Goal: Pain controlled with appropriate interventions Outcome: Completed/Met Date Met:  07/01/13 No complaints of pain however frequent bouts of nausea continue,  Phenergen suppository given tonight and IV zofran given at 1800

## 2013-07-01 NOTE — Care Management Note (Signed)
    Page 1 of 2   07/03/2013     4:08:25 PM   CARE MANAGEMENT NOTE 07/03/2013  Patient:  [name], [name]   Account Number:  [id number]  Date Initiated:  07/01/2013  Documentation initiated by:  Sharrie Rothman  Subjective/Objective Assessment:   Pt admitted from home with altered mental status. Pt lives with her husband and extended family. Pt stated that she has a cane for home use. Pt stated that she would return home at discharge.     Action/Plan:   Cm attempted to call family to discuss discharge planning and unable to reach anyone by phone. Pt may benefit from [facility] RN. Will continue to follow for discharge planning needs.   Anticipated DC Date:  07/03/2013   Anticipated DC Plan:  HOME W HOME HEALTH SERVICES      DC Planning Services  CM consult      [facility] Choice  HOME HEALTH   Choice offered to / List presented to:  [name]        HH arranged  HH-1 RN  HH-10 DISEASE MANAGEMENT  HH-2 PT      HH agency  [facility]   Status of service:  Completed, signed off Medicare Important Message given?   (If response is "NO", the following Medicare IM given date fields will be blank) Date Medicare IM given:   Date Additional Medicare IM given:    Discharge Disposition:  HOME W HOME HEALTH SERVICES  Per UR Regulation:    If discussed at Long Length of Stay Meetings, dates discussed:    Comments:  07/03/13 Rosemary Holms RN BS CM 07/01/13 1500 Arlyss Queen, RN BSN CM

## 2013-07-02 ENCOUNTER — Encounter (HOSPITAL_COMMUNITY): Payer: Self-pay | Admitting: Gastroenterology

## 2013-07-02 ENCOUNTER — Inpatient Hospital Stay (HOSPITAL_COMMUNITY)
Admit: 2013-07-02 | Discharge: 2013-07-02 | Disposition: A | Discharge status: Home / Self Care | Payer: PRIVATE HEALTH INSURANCE | Attending: Neurology | Admitting: Neurology

## 2013-07-02 DIAGNOSIS — K3184 Gastroparesis: Secondary | ICD-10-CM

## 2013-07-02 DIAGNOSIS — I10 Essential (primary) hypertension: Secondary | ICD-10-CM

## 2013-07-02 LAB — GLUCOSE, CAPILLARY
Glucose-Capillary: 113 mg/dL — ABNORMAL HIGH (ref 70–99)
Glucose-Capillary: 116 mg/dL — ABNORMAL HIGH (ref 70–99)

## 2013-07-02 LAB — BASIC METABOLIC PANEL
Calcium: 9.7 mg/dL (ref 8.4–10.5)
GFR calc non Af Amer: 55 mL/min — ABNORMAL LOW (ref >90)
Sodium: 140 mEq/L (ref 135–145)

## 2013-07-02 MED ORDER — POTASSIUM CHLORIDE 10 MEQ/100ML IV SOLN
10.0000 meq | INTRAVENOUS | Status: AC
  Administered 2013-07-02 (×4): 10 meq via INTRAVENOUS
  Filled 2013-07-02: qty 400

## 2013-07-02 MED ORDER — BENAZEPRIL HCL 10 MG PO TABS
20.0000 mg | ORAL_TABLET | Freq: Every day | ORAL | Status: DC
  Administered 2013-07-02: 20 mg via ORAL
  Filled 2013-07-02: qty 2

## 2013-07-02 MED ORDER — ONDANSETRON 4 MG PO TBDP
4.0000 mg | ORAL_TABLET | Freq: Three times a day (TID) | ORAL | Status: DC
  Administered 2013-07-02 – 2013-07-03 (×4): 4 mg via ORAL
  Filled 2013-07-02 (×8): qty 1

## 2013-07-02 MED ORDER — PANTOPRAZOLE SODIUM 40 MG PO TBEC
40.0000 mg | DELAYED_RELEASE_TABLET | Freq: Two times a day (BID) | ORAL | Status: DC
  Administered 2013-07-02 – 2013-07-03 (×2): 40 mg via ORAL
  Filled 2013-07-02 (×2): qty 1

## 2013-07-02 NOTE — Progress Notes (Signed)
PT ISTRANSFERING TO ROOM 330. PT ALERT AND ORIENTED. NO TREMORS OR SEIZURE ACTIVITY,

## 2013-07-02 NOTE — Consult Note (Signed)
REVIEWED. AGREE. 

## 2013-07-02 NOTE — Consult Note (Signed)
Referring Provider: Dr. Juanetta Gosling Primary Care Physician:  Fredirick Maudlin, MD Primary Gastroenterologist:  Dr. Jena Gauss   Date of Admission: 06/28/13 Date of Consultation: 07/02/13  Reason for Consultation:    HPI:  [name] is a 72 year old female well-known to our practice with a history of PUD secondary to NSAIDs; she had a surveillance EGD March 2014 by Dr. Jena Gauss noting healed gastric ulcer. She also has a history of gastroparesis, and she is unable to tolerate Reglan due to confusion. She was admitted July 25 with confusion and has been seen by cardiology and neurology. We have been consulted due to persistent nausea and vomiting.  Patient states she was doing well prior to admission without exacerbation of nausea or vomiting, tolerating diet. She states recurrent N/V occurred during this admission. Denies abdominal pain, melena, hematochezia, lack of appetite. Ate 1/2 piece of bacon, 1/2 bowl of grits for breakfast and tolerated this. Episode of N/V last night, so she did not eat dinner. On Protonix BID as outpatient. Has taken both Zofran and Phenergan prn in the past. Unclear what she was on prior to admission, as patient is unsure.   Past Medical History  Diagnosis Date  . Diabetes mellitus, type II   . Hypertension   . Arteriosclerotic cardiovascular disease (ASCVD)     multivessel, CABG 6/10.  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  Myoview (11/20/11) showed no ischemia & EF 62%  . Gastroesophageal reflux disease     Hiatal hernia  . Hyperlipidemia   . Deep vein thrombosis, upper right extremity     post-PPM  . Obesity   . DDD (degenerative disc disease)   . Dehiscence of closure of sternum or sternotomy 07/2009    sternal infection; required flap closure  . COPD (chronic obstructive pulmonary disease)     severe lung disease by PFTs 6/12  . Sleep apnea   . Seizure disorder   . 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 Pacemaker placement  1993    Status post DDD pacemaker implantation in 1993, with Medtronic Kappa generator change in 2003 for  second-degree AV block, most recent gen change by Fawn Kirk 04/14/11  . Revision total hip arthroplasty    . Replacement total knee      Left  . Thyroid surgery    . Wrist surgery    . 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 Reconstructive repair sternal      for infection s/p CABG 8/10  . Back surgery    . Esophagogastroduodenoscopy      with [facility] dilation,  biopsy, and disruption Schatzki's ring  . Colonoscopy  04/27/2005    hemorrhoids, diverticula  . Esophagogastroduodenoscopy  09/10/2012    RMR: Noncritical Schatzki's ring. Diffuse gastric erosions and an  area of partially healing ulceration-status post biopsy/ Small hiatal hernia. Bx reactive gastropathy.  . Abdominal hysterectomy    . Colonoscopy with esophagogastroduodenoscopy (egd) N/A 02/20/2013    RMR: pancolonic diverticulosis and internal hemorrhoids. EGD with small hiatal hernia, healed gastric ulcer    Prior to Admission medications   Medication Sig Start Date End Date Taking? Authorizing Provider  amLODipine (NORVASC) 10 MG tablet Take 10 mg by mouth daily. 11/21/11  Yes Dayna N Dunn, PA-C  aspirin EC 81 MG tablet Take 81 mg by mouth daily.  Yes Historical Provider, MD  atorvastatin (LIPITOR) 40 MG tablet Take 40 mg by mouth daily.   Yes Historical Provider, MD  Bromfenac Sodium (BROMDAY) 0.09 % SOLN Place 1 drop into the right eye 3 (three) times daily.   Yes Historical Provider, MD  doxepin (SINEQUAN) 25 MG capsule Take 25 mg by mouth at bedtime.   Yes Historical Provider, MD  estradiol (ESTRACE) 0.5 MG tablet Take 0.5 mg by mouth daily.     Yes Historical Provider, MD  fentaNYL (DURAGESIC - DOSED MCG/HR) 100 MCG/HR Place 1 patch onto the skin every 3 (three) days.   Yes Historical Provider, MD  magnesium hydroxide (MILK  OF MAGNESIA) 400 MG/5ML suspension Takes couple of tablespoons daily.   Yes Historical Provider, MD  metFORMIN (GLUCOPHAGE) 500 MG tablet Take 500 mg by mouth 2 (two) times daily with a meal.    Yes Historical Provider, MD  metoprolol tartrate (LOPRESSOR) 25 MG tablet Take 12.5 mg by mouth 2 (two) times daily.   Yes Historical Provider, MD  pantoprazole (PROTONIX) 40 MG tablet Take 1 tablet (40 mg total) by mouth 2 (two) times daily. 02/13/13  Yes Nira Retort, NP  phenytoin (DILANTIN) 100 MG ER capsule Take 300 mg by mouth at bedtime.     Yes Historical Provider, MD  potassium chloride (K-DUR) 10 MEQ tablet Take 2 tablets (20 mEq total) by mouth daily. 11/09/12  Yes Scott T Weaver, PA-C  senna-docusate (SENOKOT-S) 8.6-50 MG per tablet Take 1 tablet by mouth daily as needed for constipation.    Yes Historical Provider, MD  traMADol (ULTRAM) 50 MG tablet Take 50 mg by mouth every 6 (six) hours as needed for pain.   Yes Historical Provider, MD  hydrALAZINE (APRESOLINE) 25 MG tablet Take 25 mg by mouth every 8 (eight) hours. 11/21/11   Dayna N Dunn, PA-C  nitroGLYCERIN (NITROSTAT) 0.4 MG SL tablet Place 0.4 mg under the tongue every 5 (five) minutes as needed for chest pain.  11/21/11   Dayna N Dunn, PA-C    Current Facility-Administered Medications  Medication Dose Route Frequency Provider Last Rate Last Dose  . 0.9 %  sodium chloride infusion   Intravenous Continuous Milana Obey, MD 100 mL/hr at 07/02/13 0800    . 0.9 % NaCl with KCl 20 mEq/ L  infusion   Intravenous Continuous Vania Rea, MD 50 mL/hr at 06/29/13 0143    . acetaminophen (TYLENOL) tablet 650 mg  650 mg Oral Q6H PRN Vania Rea, MD   650 mg at 06/29/13 0456  . amLODipine (NORVASC) tablet 10 mg  10 mg Oral Daily Vania Rea, MD   10 mg at 07/01/13 1020  . aspirin EC tablet 81 mg  81 mg Oral Daily Vania Rea, MD   81 mg at 07/01/13 1027  . atorvastatin (LIPITOR) tablet 40 mg  40 mg Oral QPC supper Vania Rea, MD   40 mg at 07/01/13 1720  . diazepam (DIASTAT) rectal kit 2.5 mg  2.5 mg Rectal Q4H PRN Milana Obey, MD      . enoxaparin (LOVENOX) injection 40 mg  40 mg Subcutaneous Q24H [facility] [place], RPH   40 mg at 07/02/13 1610  . estradiol (ESTRACE) tablet 0.5 mg  0.5 mg Oral Daily Vania Rea, MD   0.5 mg at 07/01/13 1019  . hydrALAZINE (APRESOLINE) tablet 25 mg  25 mg Oral Q8H Vania Rea, MD   25 mg at 07/02/13 0752  . insulin aspart (novoLOG) injection 0-5  Units  0-5 Units Subcutaneous QHS Vania Rea, MD      . insulin aspart (novoLOG) injection 0-9 Units  0-9 Units Subcutaneous TID WC Vania Rea, MD   1 Units at 07/01/13 [phone]  . ketorolac (ACULAR) 0.5 % ophthalmic solution 1 drop  1 drop Right Eye QID Fredirick Maudlin, MD   1 drop at 07/01/13 2107  . magnesium hydroxide (MILK OF MAGNESIA) suspension 15 mL  15 mL Oral Daily PRN Vania Rea, MD      . metFORMIN (GLUCOPHAGE) tablet 500 mg  500 mg Oral BID WC Fredirick Maudlin, MD      . metoprolol (LOPRESSOR) injection 5 mg  5 mg Intravenous Q4H PRN Milana Obey, MD   5 mg at 07/02/13 9604  . metoprolol tartrate (LOPRESSOR) tablet 12.5 mg  12.5 mg Oral BID Vania Rea, MD   12.5 mg at 07/01/13 2108  . nitroGLYCERIN (NITROSTAT) SL tablet 0.4 mg  0.4 mg Sublingual Q5 min PRN Vania Rea, MD      . ondansetron [facility]) injection 4 mg  4 mg Intravenous Q4H PRN Vania Rea, MD   4 mg at 07/02/13 0446  . pantoprazole (PROTONIX) EC tablet 40 mg  40 mg Oral BID Vania Rea, MD   40 mg at 07/01/13 2108  . phenytoin (DILANTIN) ER capsule 300 mg  300 mg Oral QHS Vania Rea, MD   300 mg at 07/01/13 2108  . potassium chloride (K-DUR,KLOR-CON) CR tablet 20 mEq  20 mEq Oral Daily Fredirick Maudlin, MD   20 mEq at 07/01/13 1143  . promethazine (PHENERGAN) suppository 25 mg  25 mg Rectal Q4H PRN Milana Obey, MD   25 mg at 07/02/13 0059  . sodium chloride 0.9 % injection 10-40 mL   10-40 mL Intracatheter Q12H Fredirick Maudlin, MD   10 mL at 07/01/13 2109  . sodium chloride 0.9 % injection 10-40 mL  10-40 mL Intracatheter PRN Fredirick Maudlin, MD      . traZODone (DESYREL) tablet 50 mg  50 mg Oral QHS PRN Vania Rea, MD   50 mg at 06/30/13 2117    Allergies as of 06/28/2013 - Review Complete 06/28/2013  Allergen Reaction Noted  . Codeine  05/13/2009  . Other  03/23/2012  . Oxycodone hcl    . Reglan (metoclopramide)  03/04/2013    Family History  Problem Relation Age of Onset  . Diabetes    . Heart disease Mother     MI  . Coronary artery disease      Female <55  . Cancer    . Colon cancer Father     age 61    History   Social History  . Marital Status: Married    Spouse Name: N/A    Number of Children: N/A  . Years of Education: N/A   Occupational History  . Not on file.   Social History Main Topics  . Smoking status: Never Smoker   . Smokeless tobacco: Never Used  . Alcohol Use: No  . Drug Use: No  . Sexually Active: Not Currently    Birth Control/ Protection: Post-menopausal, Surgical   Other Topics Concern  . Not on file   Social History Narrative  . No narrative on file    Review of Systems: Negative unless mentioned in HPI  Physical Exam: Vital signs in last 24 hours: Temp:  [98 F (36.7 C)-99 F (37.2 C)] 99 F (37.2 C) (07/29 0748) Pulse Rate:  [  59-128] 128 (07/29 0800) Resp:  [3-29] 28 (07/29 0800) BP: (97-199)/(64-143) 191/114 mmHg (07/29 0700) SpO2:  [79 %-100 %] 79 % (07/29 0800) Weight:  [194 lb 10.7 oz (88.3 kg)] 194 lb 10.7 oz (88.3 kg) (07/29 0500) Last BM Date: 07/01/13 General:   Alert,  Well-developed, well-nourished, pleasant and cooperative in NAD Head:  Normocephalic and atraumatic. Eyes:  Sclera clear, no icterus.   Conjunctiva pink. Ears:  Normal auditory acuity. Nose:  No deformity, discharge,  or lesions. Mouth:  No deformity or lesions, dentition normal. Neck:  Supple; no masses or  thyromegaly. Lungs:  Clear throughout to auscultation.   No wheezes, crackles, or rhonchi. No acute distress. Heart:  S1 S2 present Abdomen:  Soft, nontender and nondistended. No masses, hepatosplenomegaly or hernias noted. Normal bowel sounds, without guarding, and without rebound.   Rectal:  Deferred  Extremities: edema bilateral lower extremities  Psych:  Alert and cooperative. Normal mood and affect.  Intake/Output from previous day: 07/28 0701 - 07/29 0700 In: 4254.7 [P.O.:1200; I.V.:2654.7; IV Piggyback:400] Out: 1400 [Urine:1400] Intake/Output this shift: Total I/O In: 340 [P.O.:240; I.V.:100] Out: -   Lab Results:  Recent Labs  06/30/13 0949 07/01/13 0438  WBC 12.9* 16.5*  HGB 11.4* 10.2*  HCT 33.2* 31.0*  PLT 280 237   BMET  Recent Labs  06/30/13 0949 07/01/13 0438  NA 138 137  K 3.0* 2.7*  CL 97 99  CO2 23 26  GLUCOSE 163* 135*  BUN 8 12  CREATININE 0.92 1.19*  CALCIUM 10.8* 9.8   PT/INR  Recent Labs  06/30/13 0949  LABPROT 14.4  INR 1.14    Studies/Results: Ct Head Wo Contrast  06/30/2013   *RADIOLOGY REPORT*  Clinical Data: Stroke  CT HEAD WITHOUT CONTRAST  Technique:  Contiguous axial images were obtained from the base of the skull through the vertex without contrast.  Comparison: 06/28/2013  Findings: Mild chronic ischemic changes in the periventricular white matter.  Mild global atrophy appropriate to age.  No mass effect, midline shift, or acute intracranial hemorrhage.  Cranium is intact.  Mastoid air cells are clear.  IMPRESSION: No acute intracranial pathology.  Chronic ischemic changes and atrophy are noted.   Original Report Authenticated By: Jolaine Click, M.D.   US Carotid Duplex Bilateral  07/01/2013   *RADIOLOGY REPORT*  Clinical Data: Hypertension, diabetes, elevated cholesterol  BILATERAL CAROTID DUPLEX ULTRASOUND  Technique: Gray scale imaging, color Doppler and duplex ultrasound were performed of bilateral carotid and vertebral  arteries in the neck.  Comparison:  None.  Criteria:  Quantification of carotid stenosis is based on velocity parameters that correlate the residual internal carotid diameter with NASCET-based stenosis levels, using the diameter of the distal internal carotid lumen as the denominator for stenosis measurement.  The following velocity measurements were obtained:                   PEAK SYSTOLIC/END DIASTOLIC RIGHT ICA:                        100/12cm/sec CCA:                        63/6cm/sec SYSTOLIC ICA/CCA RATIO:     1.60 DIASTOLIC ICA/CCA RATIO:    2.00 ECA:                        96cm/sec  LEFT ICA:  65/12cm/sec CCA:                        81/15cm/sec SYSTOLIC ICA/CCA RATIO:     0.80 DIASTOLIC ICA/CCA RATIO:    0.85 ECA:                        70cm/sec  Findings:  RIGHT CAROTID ARTERY: Minor right carotid system plaque formation. No hemodynamically significant right ICA stenosis, velocity elevation, turbulent flow.  RIGHT VERTEBRAL ARTERY:  Antegrade  LEFT CAROTID ARTERY: Similar minor plaque formation.  No hemodynamically significant left ICA stenosis, velocity elevation, or turbulent flow.  LEFT VERTEBRAL ARTERY:  Antegrade  IMPRESSION: Minor carotid plaque formation.  No hemodynamically significant ICA stenosis by ultrasound   Original Report Authenticated By: Judie Petit. Miles Costain, M.D.   Dg Chest Port 1 View  07/01/2013   *RADIOLOGY REPORT*  Clinical Data: PICC line placement  PORTABLE CHEST - 1 VIEW  Comparison: 06/28/2013  Findings: Left-sided PICC line tip terminates over the mid SVC. Evidence of CABG noted.  Right-sided dual lead pacer in place. Probable skin fold over the left lung apex.  No evidence of pneumothorax.  Technique was optimized for detection of the recently placed line.  Cardiomegaly noted without evidence for edema.  IMPRESSION: Left-sided PICC line with tip over the mid SVC, no pneumothorax.   Original Report Authenticated By: Christiana Pellant, M.D.     Impression: 72 year old female with history of PUD and gastroparesis, now admitted secondary to delirium, NSTEMI with consults to include Neurology and Cardiology. Her last EGD was March 2014 by Dr. Jena Gauss, noting healed gastric ulcer. She has known gastroparesis, which is likely the culprit of intermittent nausea and vomiting. As of this morning, she has tolerated breakfast without difficulty and has no other concerning GI signs/symptoms. Would recommend continuing PPI BID, with scheduled Zofran at meals. Patient is not a candidate for Reglan due to prior side effects of confusion. Could consider Marinol in the future if no improvement with Zofran and Phenergan.   Plan: PPI BID Gastroparesis diet Add Zofran at meals Avoid Reglan Phenergan prn Possibly consider Marinol in future if no improvement with current regimen.  Nira Retort, ANP-BC [facility]  11:36 AM    LOS: 4 days    07/02/2013

## 2013-07-02 NOTE — Progress Notes (Signed)
Pt transferred from ICU. Pt vitals stable and offered toileting at this time.  Pt resting comfortably.

## 2013-07-02 NOTE — Consult Note (Signed)
The patient was seen and examined, and I agree with the assessment and plan as documented above. Patient's BP now better controlled on Benazepril, with most recent reading of 135/73 mmHg. Troponins trending down on 7/28. Echo showed normal LV systolic function without new wall motion abnormalities. Continue medical management.

## 2013-07-02 NOTE — Progress Notes (Signed)
Offsite adult portable EEG completed at [facility].

## 2013-07-02 NOTE — Consult Note (Signed)
SUBJECTIVE:She is without complaints of chest pain or abdominal pain.  Active Problems:   Chronic diastolic heart failure   Diabetes mellitus, type II   Hypertension   Obesity   COPD (chronic obstructive pulmonary disease)   Osteoarthritis of left hip   Acute delirium   Unspecified constipation   NSTEMI (non-ST elevated myocardial infarction)   LABS: Basic Metabolic Panel:  Recent Labs  16/10/96 0438 07/02/13 0808  NA 137 140  K 2.7* 2.9*  CL 99 105  CO2 26 25  GLUCOSE 135* 128*  BUN 12 10  CREATININE 1.19* 0.99  CALCIUM 9.8 9.7   CBC:  Recent Labs  06/30/13 0949 07/01/13 0438  WBC 12.9* 16.5*  NEUTROABS  --  13.2*  HGB 11.4* 10.2*  HCT 33.2* 31.0*  MCV 84.3 85.4  PLT 280 237   Cardiac Enzymes:  Recent Labs  06/30/13 1603 06/30/13 2150 07/01/13 0437  TROPONINI 0.89* 0.62* 0.41*   Echocardiogram:07/01/2013 Study Conclusions  - Left ventricle: The cavity size was normal. There was mild concentric hypertrophy. Systolic function was normal. The estimated ejection fraction was in the range of 60% to 65%. Wall motion was normal; there were no regional wall motion abnormalities. Doppler parameters are consistent with abnormal left ventricular relaxation (grade 1 diastolic dysfunction). - Aortic valve: Mildly to moderately calcified annulus. Trileaflet; mildly calcified leaflets. No significant regurgitation. - Mitral valve: Calcified annulus. Trivial regurgitation. - Left atrium: The atrium was at the upper limits of normal in size. - Right ventricle: Pacer wire or catheter noted in right ventricle. - Right atrium: Central venous pressure: 3mm Hg (est). - Tricuspid valve: Trivial regurgitation. - Pulmonary arteries: Systolic pressure could not be accurately estimated. - Pericardium, extracardiac: A prominent pericardial fat pad was present. Impressions:  - Comparison to prior study February 2011. There is mild LVH with LVEF 60-65%, grade 1  diastolic dysfunction - no major change.   RADIOLOGY: Ct Head Wo Contrast  06/30/2013   *RADIOLOGY REPORT*  Clinical Data: Stroke  CT HEAD WITHOUT CONTRAST  Technique:  Contiguous axial images were obtained from the base of the skull through the vertex without contrast.  Comparison: 06/28/2013  Findings: Mild chronic ischemic changes in the periventricular white matter.  Mild global atrophy appropriate to age.  No mass effect, midline shift, or acute intracranial hemorrhage.  Cranium is intact.  Mastoid air cells are clear.  IMPRESSION: No acute intracranial pathology.  Chronic ischemic changes and atrophy are noted.   Original Report Authenticated By: Jolaine Click, M.D.   US Carotid Duplex Bilateral  07/01/2013   *RADIOLOGY REPORT*  Clinical Data: Hypertension, diabetes, elevated cholesterol  BILATERAL CAROTID DUPLEX ULTRASOUND  Technique: Gray scale imaging, color Doppler and duplex ultrasound were performed of bilateral carotid and vertebral arteries in the neck.  Comparison:  None.  Criteria:  Quantification of carotid stenosis is based on velocity parameters that correlate the residual internal carotid diameter with NASCET-based stenosis levels, using the diameter of the distal internal carotid lumen as the denominator for stenosis measurement.  The following velocity measurements were obtained:                   PEAK SYSTOLIC/END DIASTOLIC RIGHT ICA:                        100/12cm/sec CCA:                        63/6cm/sec SYSTOLIC ICA/CCA RATIO:  1.60 DIASTOLIC ICA/CCA RATIO:    2.00 ECA:                        96cm/sec  LEFT ICA:                        65/12cm/sec CCA:                        81/15cm/sec SYSTOLIC ICA/CCA RATIO:     0.80 DIASTOLIC ICA/CCA RATIO:    0.85 ECA:                        70cm/sec  Findings:  RIGHT CAROTID ARTERY: Minor right carotid system plaque formation. No hemodynamically significant right ICA stenosis, velocity elevation, turbulent flow.  RIGHT VERTEBRAL ARTERY:   Antegrade  LEFT CAROTID ARTERY: Similar minor plaque formation.  No hemodynamically significant left ICA stenosis, velocity elevation, or turbulent flow.  LEFT VERTEBRAL ARTERY:  Antegrade  IMPRESSION: Minor carotid plaque formation.  No hemodynamically significant ICA stenosis by ultrasound   Original Report Authenticated By: Judie Petit. Miles Costain, M.D.   Dg Chest Port 1 View  07/01/2013   *RADIOLOGY REPORT*  Clinical Data: PICC line placement  PORTABLE CHEST - 1 VIEW  Comparison: 06/28/2013  Findings: Left-sided PICC line tip terminates over the mid SVC. Evidence of CABG noted.  Right-sided dual lead pacer in place. Probable skin fold over the left lung apex.  No evidence of pneumothorax.  Technique was optimized for detection of the recently placed line.  Cardiomegaly noted without evidence for edema.  IMPRESSION: Left-sided PICC line with tip over the mid SVC, no pneumothorax.   Original Report Authenticated By: Christiana Pellant, M.D.     PHYSICAL EXAM BP 191/114  Pulse 128  Temp(Src) 99 F (37.2 C) (Oral)  Resp 28  Ht 5\' 2"  (1.575 m)  Wt 194 lb 10.7 oz (88.3 kg)  BMI 35.6 kg/m2  SpO2 79% General: Well developed, well nourished, in no acute distress, sleepy but easily aroused. Head: Eyes PERRLA, No xanthomas.   Normal cephalic and atraumatic  Lungs: Clear bilaterally to auscultation and percussion. Heart: HRRR S1 S2, No MRG .  Pulses are 2+ & equal.            No carotid bruit. No JVD.  No abdominal bruits. Abdomen: Bowel sounds are positive, abdomen soft and non-tender without masses or                  Hernia's noted.Obese Msk:  Back normal, diminished strength and tone for age. Extremities: No clubbing, cyanosis or edema.  DP +1 Neuro: Alert and oriented X 3. Psych:  Good affect, responds appropriately  TELEMETRY: Reviewed telemetry pt in: NSR sinus bradycardia in the  50-60's.   ASSESSMENT AND PLAN:  1. Abnormal troponin: Question type II NSTEMI, without acute ST/T wave changes.  Echocardiogram negative for acute WMA or decreased EF. No plans for IP cardiac ischemic testing.Last ischemic workup was via Myoview in December 2012 demonstrating no active ischemic defects with LVEF 62%   2. Hypertension: Not well controlled. Review of prior records demonstrate that she was on benazepril but no longer taking with no documentation to define reason. There were phone notes to query this as well. Will restart her on benazepril 20 mg daily for better BP control. Creatinine is .99 this am. K+ 2.9.  3. Hypokalemia: Has been given both  PO and IV replacement but remains low at 2.9 this am. Repeat dosing with follow up BMET  4. CAD: Known history with no over cardiac complaints. Presented with abdominal pain and confusion.s/p CABG 6/10, second degree HB with pacemaker in situ. Continue BB, ASA. Adding ACE.     Bettey Mare. Lyman Bishop NP [facility] 07/02/2013, 9:37 AM

## 2013-07-02 NOTE — Progress Notes (Signed)
PT NOW HAVING EEG

## 2013-07-02 NOTE — Progress Notes (Signed)
Subjective: She is awake and alert. She is not confused. She's complaining of nausea.  Objective: Vital signs in last 24 hours: Temp:  [98 F (36.7 C)-99 F (37.2 C)] 99 F (37.2 C) (07/29 0748) Pulse Rate:  [59-81] 62 (07/29 0645) Resp:  [16-29] 16 (07/29 0645) BP: (97-199)/(64-143) 160/143 mmHg (07/29 0645) SpO2:  [92 %-100 %] 100 % (07/29 0645) Weight:  [88.3 kg (194 lb 10.7 oz)] 88.3 kg (194 lb 10.7 oz) (07/29 0500) Weight change: 1.7 kg (3 lb 12 oz) Last BM Date: 07/01/13  Intake/Output from previous day: 07/28 0701 - 07/29 0700 In: 4154.7 [P.O.:1200; I.V.:2554.7; IV Piggyback:400] Out: 1400 [Urine:1400]  PHYSICAL EXAM General appearance: alert, cooperative and mild distress Resp: clear to auscultation bilaterally Cardio: regular rate and rhythm, S1, S2 normal, no murmur, click, rub or gallop GI: soft, non-tender; bowel sounds normal; no masses,  no organomegaly Extremities: extremities normal, atraumatic, no cyanosis or edema  Lab Results:    Basic Metabolic Panel:  Recent Labs  30/86/57 0949 07/01/13 0438  NA 138 137  K 3.0* 2.7*  CL 97 99  CO2 23 26  GLUCOSE 163* 135*  BUN 8 12  CREATININE 0.92 1.19*  CALCIUM 10.8* 9.8   Liver Function Tests: No results found for this basename: AST, ALT, ALKPHOS, BILITOT, PROT, ALBUMIN,  in the last 72 hours No results found for this basename: LIPASE, AMYLASE,  in the last 72 hours No results found for this basename: AMMONIA,  in the last 72 hours CBC:  Recent Labs  06/30/13 0949 07/01/13 0438  WBC 12.9* 16.5*  NEUTROABS  --  13.2*  HGB 11.4* 10.2*  HCT 33.2* 31.0*  MCV 84.3 85.4  PLT 280 237   Cardiac Enzymes:  Recent Labs  06/30/13 1603 06/30/13 2150 07/01/13 0437  TROPONINI 0.89* 0.62* 0.41*   BNP: No results found for this basename: PROBNP,  in the last 72 hours D-Dimer: No results found for this basename: DDIMER,  in the last 72 hours CBG:  Recent Labs  06/30/13 2114 07/01/13 0823  07/01/13 1213 07/01/13 1703 07/01/13 2105 07/02/13 0721  GLUCAP 138* 126* 114* 126* 128* 113*   Hemoglobin A1C: No results found for this basename: HGBA1C,  in the last 72 hours Fasting Lipid Panel: No results found for this basename: CHOL, HDL, LDLCALC, TRIG, CHOLHDL, LDLDIRECT,  in the last 72 hours Thyroid Function Tests: No results found for this basename: TSH, T4TOTAL, FREET4, T3FREE, THYROIDAB,  in the last 72 hours Anemia Panel:  Recent Labs  07/01/13 0851  VITAMINB12 590   Coagulation:  Recent Labs  06/30/13 0949  LABPROT 14.4  INR 1.14   Urine Drug Screen: Drugs of Abuse  No results found for this basename: labopia, cocainscrnur, labbenz, amphetmu, thcu, labbarb    Alcohol Level: No results found for this basename: ETH,  in the last 72 hours Urinalysis: No results found for this basename: COLORURINE, APPERANCEUR, LABSPEC, PHURINE, GLUCOSEU, HGBUR, BILIRUBINUR, KETONESUR, PROTEINUR, UROBILINOGEN, NITRITE, LEUKOCYTESUR,  in the last 72 hours Misc. Labs:  ABGS No results found for this basename: PHART, PCO2, PO2ART, TCO2, HCO3,  in the last 72 hours CULTURES Recent Results (from the past 240 hour(s))  URINE CULTURE     Status: None   Collection Time    06/28/13  5:46 PM      Result Value Range Status   Specimen Description URINE, CATHETERIZED   Final   Special Requests NONE   Final   Culture  Setup Time 06/29/2013 00:26  Final   Colony Count NO GROWTH   Final   Culture NO GROWTH   Final   Report Status 06/30/2013 FINAL   Final   Studies/Results: Ct Head Wo Contrast  06/30/2013   *RADIOLOGY REPORT*  Clinical Data: Stroke  CT HEAD WITHOUT CONTRAST  Technique:  Contiguous axial images were obtained from the base of the skull through the vertex without contrast.  Comparison: 06/28/2013  Findings: Mild chronic ischemic changes in the periventricular white matter.  Mild global atrophy appropriate to age.  No mass effect, midline shift, or acute intracranial  hemorrhage.  Cranium is intact.  Mastoid air cells are clear.  IMPRESSION: No acute intracranial pathology.  Chronic ischemic changes and atrophy are noted.   Original Report Authenticated By: Jolaine Click, M.D.   US Carotid Duplex Bilateral  07/01/2013   *RADIOLOGY REPORT*  Clinical Data: Hypertension, diabetes, elevated cholesterol  BILATERAL CAROTID DUPLEX ULTRASOUND  Technique: Gray scale imaging, color Doppler and duplex ultrasound were performed of bilateral carotid and vertebral arteries in the neck.  Comparison:  None.  Criteria:  Quantification of carotid stenosis is based on velocity parameters that correlate the residual internal carotid diameter with NASCET-based stenosis levels, using the diameter of the distal internal carotid lumen as the denominator for stenosis measurement.  The following velocity measurements were obtained:                   PEAK SYSTOLIC/END DIASTOLIC RIGHT ICA:                        100/12cm/sec CCA:                        63/6cm/sec SYSTOLIC ICA/CCA RATIO:     1.60 DIASTOLIC ICA/CCA RATIO:    2.00 ECA:                        96cm/sec  LEFT ICA:                        65/12cm/sec CCA:                        81/15cm/sec SYSTOLIC ICA/CCA RATIO:     0.80 DIASTOLIC ICA/CCA RATIO:    0.85 ECA:                        70cm/sec  Findings:  RIGHT CAROTID ARTERY: Minor right carotid system plaque formation. No hemodynamically significant right ICA stenosis, velocity elevation, turbulent flow.  RIGHT VERTEBRAL ARTERY:  Antegrade  LEFT CAROTID ARTERY: Similar minor plaque formation.  No hemodynamically significant left ICA stenosis, velocity elevation, or turbulent flow.  LEFT VERTEBRAL ARTERY:  Antegrade  IMPRESSION: Minor carotid plaque formation.  No hemodynamically significant ICA stenosis by ultrasound   Original Report Authenticated By: Judie Petit. Miles Costain, M.D.   Dg Chest Port 1 View  07/01/2013   *RADIOLOGY REPORT*  Clinical Data: PICC line placement  PORTABLE CHEST - 1 VIEW  Comparison:  06/28/2013  Findings: Left-sided PICC line tip terminates over the mid SVC. Evidence of CABG noted.  Right-sided dual lead pacer in place. Probable skin fold over the left lung apex.  No evidence of pneumothorax.  Technique was optimized for detection of the recently placed line.  Cardiomegaly noted without evidence for edema.  IMPRESSION: Left-sided PICC line with tip over the mid  SVC, no pneumothorax.   Original Report Authenticated By: Christiana Pellant, M.D.    Medications:  Prior to Admission:  Prescriptions prior to admission  Medication Sig Dispense Refill  . amLODipine (NORVASC) 10 MG tablet Take 10 mg by mouth daily.      Marland Kitchen aspirin EC 81 MG tablet Take 81 mg by mouth daily.      Marland Kitchen atorvastatin (LIPITOR) 40 MG tablet Take 40 mg by mouth daily.      . Bromfenac Sodium (BROMDAY) 0.09 % SOLN Place 1 drop into the right eye 3 (three) times daily.      Marland Kitchen doxepin (SINEQUAN) 25 MG capsule Take 25 mg by mouth at bedtime.      Marland Kitchen estradiol (ESTRACE) 0.5 MG tablet Take 0.5 mg by mouth daily.        . fentaNYL (DURAGESIC - DOSED MCG/HR) 100 MCG/HR Place 1 patch onto the skin every 3 (three) days.      . magnesium hydroxide (MILK OF MAGNESIA) 400 MG/5ML suspension Takes couple of tablespoons daily.      . metFORMIN (GLUCOPHAGE) 500 MG tablet Take 500 mg by mouth 2 (two) times daily with a meal.       . metoprolol tartrate (LOPRESSOR) 25 MG tablet Take 12.5 mg by mouth 2 (two) times daily.      . pantoprazole (PROTONIX) 40 MG tablet Take 1 tablet (40 mg total) by mouth 2 (two) times daily.  60 tablet  11  . phenytoin (DILANTIN) 100 MG ER capsule Take 300 mg by mouth at bedtime.        . potassium chloride (K-DUR) 10 MEQ tablet Take 2 tablets (20 mEq total) by mouth daily.  60 tablet  3  . senna-docusate (SENOKOT-S) 8.6-50 MG per tablet Take 1 tablet by mouth daily as needed for constipation.       . traMADol (ULTRAM) 50 MG tablet Take 50 mg by mouth every 6 (six) hours as needed for pain.      .  hydrALAZINE (APRESOLINE) 25 MG tablet Take 25 mg by mouth every 8 (eight) hours.      . nitroGLYCERIN (NITROSTAT) 0.4 MG SL tablet Place 0.4 mg under the tongue every 5 (five) minutes as needed for chest pain.        Scheduled: . amLODipine  10 mg Oral Daily  . aspirin EC  81 mg Oral Daily  . atorvastatin  40 mg Oral QPC supper  . enoxaparin (LOVENOX) injection  40 mg Subcutaneous Q24H  . estradiol  0.5 mg Oral Daily  . hydrALAZINE  25 mg Oral Q8H  . insulin aspart  0-5 Units Subcutaneous QHS  . insulin aspart  0-9 Units Subcutaneous TID WC  . ketorolac  1 drop Right Eye QID  . metFORMIN  500 mg Oral BID WC  . metoprolol tartrate  12.5 mg Oral BID  . pantoprazole  40 mg Oral BID  . phenytoin  300 mg Oral QHS  . potassium chloride  20 mEq Oral Daily  . sodium chloride  10-40 mL Intracatheter Q12H   Continuous: . sodium chloride 1,000 mL (07/02/13 0631)  . 0.9 % NaCl with KCl 20 mEq / Wolf 50 mL/hr at 06/29/13 0143   QIO:[id number], diazepam, magnesium hydroxide, metoprolol, nitroGLYCERIN, ondansetron (ZOFRAN) IV, promethazine, sodium chloride, traZODone  Assesment: She was admitted with delirium. She has had a non-STEMI. She has some chronic diastolic heart failure. Her delirium has improved. She denies any chest pain. She's complaining of nausea. She has  had episodes of nausea in the past and probably has some element of diabetic gastroparesis. Her blood sugar has been pretty good. Her blood pressure has been up but I'm not sure that we're getting accurate readings. Her blood pressure was listed in the 90s systolic then 190 systolic and then with a pulse pressure of only about 15 mmHg and I don't think that's accurate. Active Problems:   Chronic diastolic heart failure   Diabetes mellitus, type II   Hypertension   Obesity   COPD (chronic obstructive pulmonary disease)   Osteoarthritis of left hip   Acute delirium   Unspecified constipation   NSTEMI (non-ST elevated myocardial  infarction)    Plan: Continue current treatments. I will asked for GI to see her and see if they can help [place] to control her nausea. She does not need to be in the ICU now so I'm going to move her    LOS: 4 days   [name] 07/02/2013, 7:59 AM

## 2013-07-03 LAB — GLUCOSE, CAPILLARY
Glucose-Capillary: 92 mg/dL (ref 70–99)
Glucose-Capillary: 97 mg/dL (ref 70–99)

## 2013-07-03 MED ORDER — LEVETIRACETAM 250 MG PO TABS
250.0000 mg | ORAL_TABLET | Freq: Two times a day (BID) | ORAL | Status: DC
  Administered 2013-07-03: 250 mg via ORAL
  Filled 2013-07-03 (×5): qty 1

## 2013-07-03 MED ORDER — BENAZEPRIL HCL 10 MG PO TABS
40.0000 mg | ORAL_TABLET | Freq: Every day | ORAL | Status: DC
  Administered 2013-07-03: 40 mg via ORAL
  Filled 2013-07-03: qty 4

## 2013-07-03 MED ORDER — BENAZEPRIL HCL 40 MG PO TABS: 40.0000 mg | ORAL_TABLET | Freq: Every day | ORAL | Status: DC

## 2013-07-03 MED ORDER — HYDRALAZINE HCL 25 MG PO TABS
50.0000 mg | ORAL_TABLET | Freq: Three times a day (TID) | ORAL | Status: DC
  Administered 2013-07-03: 50 mg via ORAL
  Filled 2013-07-03: qty 2

## 2013-07-03 MED ORDER — LEVETIRACETAM 250 MG PO TABS: 250.0000 mg | ORAL_TABLET | Freq: Two times a day (BID) | ORAL | Status: DC

## 2013-07-03 MED ORDER — POTASSIUM CHLORIDE CRYS ER 20 MEQ PO TBCR
40.0000 meq | EXTENDED_RELEASE_TABLET | Freq: Two times a day (BID) | ORAL | Status: DC
  Administered 2013-07-03: 40 meq via ORAL
  Filled 2013-07-03: qty 2

## 2013-07-03 MED ORDER — SODIUM CHLORIDE 0.9 % IV SOLN
500.0000 mg | Freq: Once | INTRAVENOUS | Status: AC
  Administered 2013-07-03: 500 mg via INTRAVENOUS
  Filled 2013-07-03: qty 5

## 2013-07-03 NOTE — Evaluation (Addendum)
Physical Therapy Evaluation Patient Details Name: [name] MRN: [id number] DOB: June 27, 1941 Today's Date: 07/03/2013 Time: 0950-1020 PT Time Calculation (min): 30 min  PT Assessment / Plan / Recommendation History of Present Illness   Pt is a 72 yo female admitted to [facility] with encephalopathy.  Pt lives at home with husband and uses a quad cane to ambulate with.  Pt normally stays in the house but occasionally will go out to eat  Clinical Impression  Pt fatigues easily but is is modified I with short distances.      PT Assessment  Patient needs continued PT services    Follow Up Recommendations  Home health PT    Does the patient have the potential to tolerate intense rehabilitation    no  Barriers to Discharge  none      Equipment Recommendations  None recommended by PT    Recommendations for Other Services   none  Frequency Min 3X/week    Precautions / Restrictions Precautions Precautions: None Restrictions Weight Bearing Restrictions: No   Pertinent Vitals/Pain none      Mobility  Bed Mobility Supine to Sit: 5: Supervision;HOB flat;With rails Sit to Supine: Not Tested (comment) Transfers Sit to Stand: 6: Modified independent (Device/Increase time) Stand to Sit: 7: Independent Details for Transfer Assistance: able to come sit to stand from bed as well as commode Ambulation/Gait Ambulation/Gait Assistance: 6: Modified independent (Device/Increase time) Ambulation Distance (Feet): 60 Feet Assistive device: Large base quad cane Ambulation/Gait Assistance Details: Pt fatiques quickly  Gait Pattern: Within Functional Limits    Exercises General Exercises - Lower Extremity Ankle Circles/Pumps: 10 reps Long Arc Quad: 10 reps   PT Diagnosis: Generalized weakness  PT Problem List: Decreased strength;Decreased activity tolerance PT Treatment Interventions: Gait training;Therapeutic activities     PT Goals(Current goals can be found in the care plan  section) Acute Rehab PT Goals Patient Stated Goal: none stated  Visit Information  Last PT Received On: 07/03/13       Prior Functioning  Home Living Family/patient expects to be discharged to:: Private residence Living Arrangements: Spouse/significant other;Other relatives Available Help at Discharge: Family;Available 24 hours/day Type of Home: House Home Access: Ramped entrance Home Equipment: Bedside commode;Cane - quad;Walker - 2 wheels Prior Function Level of Independence: Independent with assistive device(s) Comments: unknown Communication Communication: No difficulties    Cognition  Cognition Arousal/Alertness: Awake/alert Behavior During Therapy: WFL for tasks assessed/performed Overall Cognitive Status: Within Functional Limits for tasks assessed Memory: Decreased short-term memory    Extremity/Trunk Assessment Lower Extremity Assessment Lower Extremity Assessment: Generalized weakness (45)   Balance    End of Session PT - End of Session Equipment Utilized During Treatment: Gait belt Activity Tolerance: Patient limited by fatigue Patient left: in bed  GP     RUSSELL,CINDY 07/03/2013, 10:20 AM

## 2013-07-03 NOTE — Progress Notes (Signed)
Discharged to home, out via w/c in stable condition with staff.

## 2013-07-03 NOTE — Progress Notes (Signed)
Subjective: She says she feels better. She is awake and has no confusion now. She has a history of seizure disorder but her EEG did not show definite seizure activity now. Her nausea is improved. Her seated blood pressure is elevated but she does have some orthostasis still.  Objective: Vital signs in last 24 hours: Temp:  [98.4 F (36.9 C)-98.9 F (37.2 C)] 98.4 F (36.9 C) (07/30 0450) Pulse Rate:  [61-78] 78 (07/30 0500) Resp:  [16-25] 20 (07/30 0500) BP: (104-191)/(57-98) 165/94 mmHg (07/30 0758) SpO2:  [94 %-100 %] 94 % (07/30 0500) Weight:  [87.2 kg (192 lb 3.9 oz)] 87.2 kg (192 lb 3.9 oz) (07/30 0421) Weight change: -1.1 kg (-2 lb 6.8 oz) Last BM Date: 07/01/13  Intake/Output from previous day: 07/29 0701 - 07/30 0700 In: 1540 [P.O.:840; I.V.:300; IV Piggyback:400] Out: 900 [Urine:900]  PHYSICAL EXAM General appearance: alert, cooperative, no distress and morbidly obese Resp: clear to auscultation bilaterally Cardio: regular rate and rhythm, S1, S2 normal, no murmur, click, rub or gallop GI: soft, non-tender; bowel sounds normal; no masses,  no organomegaly Extremities: extremities normal, atraumatic, no cyanosis or edema  Lab Results:    Basic Metabolic Panel:  Recent Labs  16/10/96 0438 07/02/13 0808  NA 137 140  K 2.7* 2.9*  CL 99 105  CO2 26 25  GLUCOSE 135* 128*  BUN 12 10  CREATININE 1.19* 0.99  CALCIUM 9.8 9.7   Liver Function Tests: No results found for this basename: AST, ALT, ALKPHOS, BILITOT, PROT, ALBUMIN,  in the last 72 hours No results found for this basename: LIPASE, AMYLASE,  in the last 72 hours No results found for this basename: AMMONIA,  in the last 72 hours CBC:  Recent Labs  06/30/13 0949 07/01/13 0438  WBC 12.9* 16.5*  NEUTROABS  --  13.2*  HGB 11.4* 10.2*  HCT 33.2* 31.0*  MCV 84.3 85.4  PLT 280 237   Cardiac Enzymes:  Recent Labs  06/30/13 1603 06/30/13 2150 07/01/13 0437  TROPONINI 0.89* 0.62* 0.41*   BNP: No  results found for this basename: PROBNP,  in the last 72 hours D-Dimer: No results found for this basename: DDIMER,  in the last 72 hours CBG:  Recent Labs  07/01/13 2105 07/02/13 0721 07/02/13 1114 07/02/13 1629 07/02/13 2229 07/03/13 0734  GLUCAP 128* 113* 116* 108* 110* 92   Hemoglobin A1C: No results found for this basename: HGBA1C,  in the last 72 hours Fasting Lipid Panel: No results found for this basename: CHOL, HDL, LDLCALC, TRIG, CHOLHDL, LDLDIRECT,  in the last 72 hours Thyroid Function Tests: No results found for this basename: TSH, T4TOTAL, FREET4, T3FREE, THYROIDAB,  in the last 72 hours Anemia Panel:  Recent Labs  07/01/13 0851  VITAMINB12 590   Coagulation:  Recent Labs  06/30/13 0949  LABPROT 14.4  INR 1.14   Urine Drug Screen: Drugs of Abuse  No results found for this basename: labopia, cocainscrnur, labbenz, amphetmu, thcu, labbarb    Alcohol Level: No results found for this basename: ETH,  in the last 72 hours Urinalysis: No results found for this basename: COLORURINE, APPERANCEUR, LABSPEC, PHURINE, GLUCOSEU, HGBUR, BILIRUBINUR, KETONESUR, PROTEINUR, UROBILINOGEN, NITRITE, LEUKOCYTESUR,  in the last 72 hours Misc. Labs:  ABGS No results found for this basename: PHART, PCO2, PO2ART, TCO2, HCO3,  in the last 72 hours CULTURES Recent Results (from the past 240 hour(s))  URINE CULTURE     Status: None   Collection Time    06/28/13  5:46 PM      Result Value Range Status   Specimen Description URINE, CATHETERIZED   Final   Special Requests NONE   Final   Culture  Setup Time 06/29/2013 00:26   Final   Colony Count NO GROWTH   Final   Culture NO GROWTH   Final   Report Status 06/30/2013 FINAL   Final   Studies/Results: US Carotid Duplex Bilateral  07/01/2013   *RADIOLOGY REPORT*  Clinical Data: Hypertension, diabetes, elevated cholesterol  BILATERAL CAROTID DUPLEX ULTRASOUND  Technique: Gray scale imaging, color Doppler and duplex ultrasound  were performed of bilateral carotid and vertebral arteries in the neck.  Comparison:  None.  Criteria:  Quantification of carotid stenosis is based on velocity parameters that correlate the residual internal carotid diameter with NASCET-based stenosis levels, using the diameter of the distal internal carotid lumen as the denominator for stenosis measurement.  The following velocity measurements were obtained:                   PEAK SYSTOLIC/END DIASTOLIC RIGHT ICA:                        100/12cm/sec CCA:                        63/6cm/sec SYSTOLIC ICA/CCA RATIO:     1.60 DIASTOLIC ICA/CCA RATIO:    2.00 ECA:                        96cm/sec  LEFT ICA:                        65/12cm/sec CCA:                        81/15cm/sec SYSTOLIC ICA/CCA RATIO:     0.80 DIASTOLIC ICA/CCA RATIO:    0.85 ECA:                        70cm/sec  Findings:  RIGHT CAROTID ARTERY: Minor right carotid system plaque formation. No hemodynamically significant right ICA stenosis, velocity elevation, turbulent flow.  RIGHT VERTEBRAL ARTERY:  Antegrade  LEFT CAROTID ARTERY: Similar minor plaque formation.  No hemodynamically significant left ICA stenosis, velocity elevation, or turbulent flow.  LEFT VERTEBRAL ARTERY:  Antegrade  IMPRESSION: Minor carotid plaque formation.  No hemodynamically significant ICA stenosis by ultrasound   Original Report Authenticated By: Judie Petit. Miles Costain, M.D.   Dg Chest Port 1 View  07/01/2013   *RADIOLOGY REPORT*  Clinical Data: PICC line placement  PORTABLE CHEST - 1 VIEW  Comparison: 06/28/2013  Findings: Left-sided PICC line tip terminates over the mid SVC. Evidence of CABG noted.  Right-sided dual lead pacer in place. Probable skin fold over the left lung apex.  No evidence of pneumothorax.  Technique was optimized for detection of the recently placed line.  Cardiomegaly noted without evidence for edema.  IMPRESSION: Left-sided PICC line with tip over the mid SVC, no pneumothorax.   Original Report Authenticated By:  Christiana Pellant, M.D.    Medications:  Prior to Admission:  Prescriptions prior to admission  Medication Sig Dispense Refill  . amLODipine (NORVASC) 10 MG tablet Take 10 mg by mouth daily.      Marland Kitchen aspirin EC 81 MG tablet Take 81 mg by mouth daily.      Marland Kitchen  atorvastatin (LIPITOR) 40 MG tablet Take 40 mg by mouth daily.      . Bromfenac Sodium (BROMDAY) 0.09 % SOLN Place 1 drop into the right eye 3 (three) times daily.      Marland Kitchen doxepin (SINEQUAN) 25 MG capsule Take 25 mg by mouth at bedtime.      Marland Kitchen estradiol (ESTRACE) 0.5 MG tablet Take 0.5 mg by mouth daily.        . fentaNYL (DURAGESIC - DOSED MCG/HR) 100 MCG/HR Place 1 patch onto the skin every 3 (three) days.      . magnesium hydroxide (MILK OF MAGNESIA) 400 MG/5ML suspension Takes couple of tablespoons daily.      . metFORMIN (GLUCOPHAGE) 500 MG tablet Take 500 mg by mouth 2 (two) times daily with a meal.       . metoprolol tartrate (LOPRESSOR) 25 MG tablet Take 12.5 mg by mouth 2 (two) times daily.      . pantoprazole (PROTONIX) 40 MG tablet Take 1 tablet (40 mg total) by mouth 2 (two) times daily.  60 tablet  11  . phenytoin (DILANTIN) 100 MG ER capsule Take 300 mg by mouth at bedtime.        . potassium chloride (K-DUR) 10 MEQ tablet Take 2 tablets (20 mEq total) by mouth daily.  60 tablet  3  . senna-docusate (SENOKOT-S) 8.6-50 MG per tablet Take 1 tablet by mouth daily as needed for constipation.       . traMADol (ULTRAM) 50 MG tablet Take 50 mg by mouth every 6 (six) hours as needed for pain.      . hydrALAZINE (APRESOLINE) 25 MG tablet Take 25 mg by mouth every 8 (eight) hours.      . nitroGLYCERIN (NITROSTAT) 0.4 MG SL tablet Place 0.4 mg under the tongue every 5 (five) minutes as needed for chest pain.        Scheduled: . amLODipine  10 mg Oral Daily  . aspirin EC  81 mg Oral Daily  . atorvastatin  40 mg Oral QPC supper  . benazepril  20 mg Oral Daily  . enoxaparin (LOVENOX) injection  40 mg Subcutaneous Q24H  . estradiol  0.5 mg  Oral Daily  . hydrALAZINE  25 mg Oral Q8H  . insulin aspart  0-5 Units Subcutaneous QHS  . insulin aspart  0-9 Units Subcutaneous TID WC  . ketorolac  1 drop Right Eye QID  . levETIRAcetam  500 mg Intravenous Once  . levETIRAcetam  250 mg Oral BID  . metFORMIN  500 mg Oral BID WC  . metoprolol tartrate  12.5 mg Oral BID  . ondansetron  4 mg Oral TID WC & HS  . pantoprazole  40 mg Oral BID AC  . potassium chloride  20 mEq Oral Daily  . sodium chloride  10-40 mL Intracatheter Q12H   Continuous: . sodium chloride 50 mL/hr at 07/02/13 1500  . 0.9 % NaCl with KCl 20 mEq / L 50 mL/hr at 07/02/13 1758   ZOX:[id number], diazepam, magnesium hydroxide, metoprolol, nitroGLYCERIN, ondansetron (ZOFRAN) IV, promethazine, sodium chloride, traZODone  Assesment: She had acute delirium. She is better with that. She has gastroparesis and that seems better as well. She had a non-STEMI during her episode of delirium and is doing better. She has hypertension which is still not controlled but does have orthostasis. She has seizure disorder but did not have definite seizure activity on her EEG Active Problems:   Chronic diastolic heart failure   Diabetes mellitus,  type II   Hypertension   Obesity   COPD (chronic obstructive pulmonary disease)   Osteoarthritis of left hip   Acute delirium   Unspecified constipation   NSTEMI (non-ST elevated myocardial infarction)    Plan: I will advance her diet. She will have TED hose. She will have her blood pressure medications adjusted. She will have physical therapy consult. I think she can probably go home tomorrow    LOS: 5 days   Illene Sweeting L 07/03/2013, 8:52 AM

## 2013-07-03 NOTE — Progress Notes (Signed)
Patient ID: Dayle Points, female   DOB: [birth year], 72 y.o.   MRN: [id number]   Patient Name: [name] Date of Encounter: 07/03/2013    SUBJECTIVE  No complaints of chest pain or shortness of breath. Up with assistance. Systolic blood pressure still high  CURRENT MEDS . amLODipine  10 mg Oral Daily  . aspirin EC  81 mg Oral Daily  . atorvastatin  40 mg Oral QPC supper  . benazepril  40 mg Oral Daily  . enoxaparin (LOVENOX) injection  40 mg Subcutaneous Q24H  . estradiol  0.5 mg Oral Daily  . hydrALAZINE  50 mg Oral Q8H  . insulin aspart  0-5 Units Subcutaneous QHS  . insulin aspart  0-9 Units Subcutaneous TID WC  . ketorolac  1 drop Right Eye QID  . levETIRAcetam  500 mg Intravenous Once  . levETIRAcetam  250 mg Oral BID  . metFORMIN  500 mg Oral BID WC  . metoprolol tartrate  12.5 mg Oral BID  . ondansetron  4 mg Oral TID WC & HS  . pantoprazole  40 mg Oral BID AC  . potassium chloride  20 mEq Oral Daily  . sodium chloride  10-40 mL Intracatheter Q12H    OBJECTIVE  Filed Vitals:   07/03/13 0450 07/03/13 0455 07/03/13 0500 07/03/13 0758  BP: 123/66 126/80 104/57 165/94  Pulse: 69 75 78   Temp: 98.4 F (36.9 C)     TempSrc: Oral     Resp: 20 20 20    Height:      Weight:      SpO2: 95% 94% 94%     Intake/Output Summary (Last 24 hours) at 07/03/13 0916 Last data filed at 07/03/13 0506  Gross per 24 hour  Intake   1200 ml  Output    400 ml  Net    800 ml   Filed Weights   07/01/13 0500 07/02/13 0500 07/03/13 0421  Weight: 195 lb 5.2 oz (88.6 kg) 194 lb 10.7 oz (88.3 kg) 192 lb 3.9 oz (87.2 kg)    PHYSICAL EXAM  General: Pleasant, NAD. Obese Neuro: Alert and oriented X 3. Moves all extremities spontaneously. Psych: Normal affect. HEENT:  Normal  Neck: Supple without bruits or JVD. Lungs:  Resp regular and unlabored, CTA. Heart: RRR no s3, s4, or murmurs. Abdomen: Soft, non-tender, non-distended, BS + x 4.  Extremities: No clubbing, cyanosis,  minimal edema DP/PT/Radials 2+ and equal bilaterally.  Accessory Clinical Findings  CBC  Recent Labs  06/30/13 0949 07/01/13 0438  WBC 12.9* 16.5*  NEUTROABS  --  13.2*  HGB 11.4* 10.2*  HCT 33.2* 31.0*  MCV 84.3 85.4  PLT 280 237   Basic Metabolic Panel  Recent Labs  07/01/13 0438 07/02/13 0808  NA 137 140  K 2.7* 2.9*  CL 99 105  CO2 26 25  GLUCOSE 135* 128*  BUN 12 10  CREATININE 1.19* 0.99  CALCIUM 9.8 9.7   Liver Function Tests No results found for this basename: AST, ALT, ALKPHOS, BILITOT, PROT, ALBUMIN,  in the last 72 hours No results found for this basename: LIPASE, AMYLASE,  in the last 72 hours Cardiac Enzymes  Recent Labs  06/30/13 1603 06/30/13 2150 07/01/13 0437  TROPONINI 0.89* 0.62* 0.41*   BNP No components found with this basename: POCBNP,  D-Dimer No results found for this basename: DDIMER,  in the last 72 hours Hemoglobin A1C No results found for this basename: HGBA1C,  in the last 72 hours  Fasting Lipid Panel No results found for this basename: CHOL, HDL, LDLCALC, TRIG, CHOLHDL, LDLDIRECT,  in the last 72 hours Thyroid Function Tests No results found for this basename: TSH, T4TOTAL, FREET3, T3FREE, THYROIDAB,  in the last 72 hours  TELE   ECG   Radiology/Studies  Ct Head Wo Contrast  06/30/2013   *RADIOLOGY REPORT*  Clinical Data: Stroke  CT HEAD WITHOUT CONTRAST  Technique:  Contiguous axial images were obtained from the base of the skull through the vertex without contrast.  Comparison: 06/28/2013  Findings: Mild chronic ischemic changes in the periventricular white matter.  Mild global atrophy appropriate to age.  No mass effect, midline shift, or acute intracranial hemorrhage.  Cranium is intact.  Mastoid air cells are clear.  IMPRESSION: No acute intracranial pathology.  Chronic ischemic changes and atrophy are noted.   Original Report Authenticated By: Jolaine Click, M.D.   Ct Head Wo Contrast  06/28/2013   *RADIOLOGY REPORT*   Clinical Data: Mental status change, history hypertension, diabetes, seizures, COPD, vascular disease  CT HEAD WITHOUT CONTRAST  Technique:  Contiguous axial images were obtained from the base of the skull through the vertex without contrast.  Comparison: None  Findings: Generalized atrophy. Normal ventricular morphology. No midline shift or mass effect. Small vessel chronic ischemic changes of deep cerebral white matter. No intracranial hemorrhage, mass lesion, or evidence of acute infarction. No extra-axial fluid collections. Atherosclerotic calcifications at skull base. No acute bone or sinus abnormality.  IMPRESSION: Atrophy with small vessel chronic ischemic changes of deep cerebral white matter. No acute intracranial abnormalities.   Original Report Authenticated By: Ulyses Southward, M.D.   Ct Abdomen Pelvis W Contrast  06/29/2013   *RADIOLOGY REPORT*  Clinical Data: Abdominal pain.  Confusion.  Decreased appetite and epigastric pain.  Constipation.  CT ABDOMEN AND PELVIS WITH CONTRAST  Technique:  Multidetector CT imaging of the abdomen and pelvis was performed following the standard protocol during bolus administration of intravenous contrast.  Contrast: 50mL OMNIPAQUE IOHEXOL 300 MG/ML  SOLN, OMNIPAQUE IOHEXOL 300 MG/ML  SOLN  Comparison: 09/16/2012  Findings: Atelectasis or fibrosis in the lung bases.  Cardiac enlargement.  Surgical absence of the gallbladder.  Small esophageal hiatal hernia.  Gastric Marai Teehan is not thickened.  Small bowel are not abnormally distended.  No bowel Marx Doig thickening.  Stool filled colon without distension or Inga Noller thickening.  The liver, spleen, pancreas, adrenal glands, kidneys, inferior vena cava, and retroperitoneal lymph nodes are unremarkable. Calcification of abdominal aorta without aneurysm.  No free fluid or free air in the abdomen.  Prominent visceral adipose tissues. Umbilical/periumbilical hernia containing fat.  Scattered surgical clips in the abdominal Marqus Macphee.   Pelvis:  Surgical clips in the right groin/inguinal region. Visualization of portions of the pelvis is limited due to streak artifact from right hip prosthesis.  The bladder Sheniece Ruggles appears mildly thickened.  This could be due to cystitis.  The uterus appears to be surgically absent.  No abnormal adnexal masses. Stool filled rectosigmoid colon.  No evidence of diverticulitis. The appendix is not identified.  No free or loculated pelvic fluid collections.  No significant pelvic lymphadenopathy.  Postoperative and degenerative changes in the lumbar spine.  There is anterior subluxation of L4 on L5 but this appears stable since previous study.  No destructive bone lesions appreciated.  Degenerative disc disease at L2-3.  IMPRESSION: Suggestion of mild bladder Alveta Quintela thickening which could represent cystitis.  No evidence of bowel obstruction.  No acute processes otherwise identified.  Original Report Authenticated By: Burman Nieves, M.D.   US Carotid Duplex Bilateral  07/01/2013   *RADIOLOGY REPORT*  Clinical Data: Hypertension, diabetes, elevated cholesterol  BILATERAL CAROTID DUPLEX ULTRASOUND  Technique: Gray scale imaging, color Doppler and duplex ultrasound were performed of bilateral carotid and vertebral arteries in the neck.  Comparison:  None.  Criteria:  Quantification of carotid stenosis is based on velocity parameters that correlate the residual internal carotid diameter with NASCET-based stenosis levels, using the diameter of the distal internal carotid lumen as the denominator for stenosis measurement.  The following velocity measurements were obtained:                   PEAK SYSTOLIC/END DIASTOLIC RIGHT ICA:                        100/12cm/sec CCA:                        63/6cm/sec SYSTOLIC ICA/CCA RATIO:     1.60 DIASTOLIC ICA/CCA RATIO:    2.00 ECA:                        96cm/sec  LEFT ICA:                        65/12cm/sec CCA:                        81/15cm/sec SYSTOLIC ICA/CCA RATIO:     0.80  DIASTOLIC ICA/CCA RATIO:    0.85 ECA:                        70cm/sec  Findings:  RIGHT CAROTID ARTERY: Minor right carotid system plaque formation. No hemodynamically significant right ICA stenosis, velocity elevation, turbulent flow.  RIGHT VERTEBRAL ARTERY:  Antegrade  LEFT CAROTID ARTERY: Similar minor plaque formation.  No hemodynamically significant left ICA stenosis, velocity elevation, or turbulent flow.  LEFT VERTEBRAL ARTERY:  Antegrade  IMPRESSION: Minor carotid plaque formation.  No hemodynamically significant ICA stenosis by ultrasound   Original Report Authenticated By: Judie Petit. Miles Costain, M.D.   Dg Chest Port 1 View  07/01/2013   *RADIOLOGY REPORT*  Clinical Data: PICC line placement  PORTABLE CHEST - 1 VIEW  Comparison: 06/28/2013  Findings: Left-sided PICC line tip terminates over the mid SVC. Evidence of CABG noted.  Right-sided dual lead pacer in place. Probable skin fold over the left lung apex.  No evidence of pneumothorax.  Technique was optimized for detection of the recently placed line.  Cardiomegaly noted without evidence for edema.  IMPRESSION: Left-sided PICC line with tip over the mid SVC, no pneumothorax.   Original Report Authenticated By: Christiana Pellant, M.D.   Dg Abd Acute W/chest  06/28/2013   *RADIOLOGY REPORT*  Clinical Data: Constipation, confusion, vomiting, hypertension, diabetes, COPD, GERD  ACUTE ABDOMEN SERIES (ABDOMEN 2 VIEW & CHEST 1 VIEW)  Comparison: 03/03/2013  Findings: Right subclavian transvenous pacemaker leads project over right atrium and right ventricle. Enlargement of cardiac silhouette post CABG. Tortuous aorta. Pulmonary vascularity normal. Lungs clear. No pleural effusion or pneumothorax. Prior lumbar fusion. Surgical clips right upper quadrant question cholecystectomy. Prior right hip replacement. Nonobstructive bowel gas pattern. No bowel dilatation, bowel Shakura Cowing thickening, or free intraperitoneal air. Osteoarthritic changes left hip. Diffuse osseous  demineralization.  IMPRESSION: No acute abdominal findings. Enlargement of cardiac silhouette  post pacemaker and CABG.   Original Report Authenticated By: Ulyses Southward, M.D.    ASSESSMENT AND PLAN  Active Problems:   Chronic diastolic heart failure   Diabetes mellitus, type II   Hypertension   Obesity   COPD (chronic obstructive pulmonary disease)   Osteoarthritis of left hip   Acute delirium   Unspecified constipation   NSTEMI (non-ST elevated myocardial infarction)    Clinically improving. We'll increase benazepril to 40 mg a day. Potassium being supplemented. Follow electrolytes and renal function. No plans for further cardiac workup.  Signed, Valera Castle MD

## 2013-07-03 NOTE — Procedures (Signed)
[name], [name]                ACCOUNT NO.:  [id number]  MEDICAL RECORD NO.:  [id number]  LOCATION:  EE                           FACILITY:  [facility]  PHYSICIAN:  Keefe Zawistowski A. [name], M.D. DATE OF BIRTH:  Aug 12, 1941  DATE OF PROCEDURE: DATE OF DISCHARGE:  07/02/2013                             EEG INTERPRETATION   HISTORY:  This is a 72 year old lady who presents with confusion, altered mental status, and spells suspicious for seizures.  MEDICATIONS:  Dilantin, Protonix, Lopressor, insulin, Estrace, Norvasc, aspirin, potassium, Lovenox, Glucophage.  ANALYSIS:  This is a 16-channel recording using standard 10/20 measurements conducted for 21 minutes.  There is a background activity gets as high as 7 hertz which attenuates with eye opening.  There is beta activity observed in the frontal areas.  Awake and drowsy activities are recorded.  Photic stimulation and hyperventilation were not carried out.  There is no focal or lateralized slowing.  There is no epileptiform activity.  IMPRESSION:  Mild generalized slowing, however, there is no epileptiform activity observed.     Kanan Sobek A. [name], M.D.     KAD/MEDQ  D:  07/03/2013  T:  07/03/2013  Job:  [id number]

## 2013-07-03 NOTE — Progress Notes (Signed)
Patient ID: [name], female   DOB: 1941/04/11, 72 y.o.   MRN: [id number]  [facility] NEUROLOGY Brylin Stopper A. [name], MD     [email or web address]          [name] is an 72 y.o. female.   Assessment/Plan: 1.  Resolved encephalopathy. Etiology unclear but the patient does have a baseline history of seizures and her Dilantin level has been subtherapeutic. In fact, the level was undetected. It is possible that she may have had unwitnessed seizures and/or/nonconvulsive seizures. Given that the level is undetected, she will be switched to another agent. We will try the patient on Keppra.  2. 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  2. Orthostatic hypotension. The patient obese placed on support stockings. It is not helpful, she could benefit from medication such as Florinef or midodrine if needed.   The patient has had carotid Dopplers which were fine. MRI cannot be done. The patient's encephalopathy has improved and she appears to be at baseline. She has had no more recurrent spells of confusion.    GENERAL: The patient is in no acute distress.  HEENT:  Unremarkable.  ABDOMEN: soft  EXTREMITIES: No edema   BACK: Unremarkable.  SKIN: Normal by inspection.    MENTAL STATUS: Patient is awake and alert. She is oriented 2. She speaks in full sentences and is lucid. Follows commands well.  CRANIAL NERVES: The upper and lower facial muscles are normal in strength and symmetric, there is no flattening of the nasolabial folds; tongue is midline.  MOTOR: Strength has improved bilaterally been 4+/5 in the upper and lower extremity is bilaterally.  COORDINATION: Left finger to nose is normal, right finger to nose is normal, No rest tremor; no intention tremor; no postural tremor; no bradykinesia.    Objective:    LYING BP 123/66 HR 69;; STANDING 104/57 HR 78 Vital signs in last 24 hours: Temp:  [98.4 F (36.9 C)-99 F (37.2 C)] 98.4 F (36.9 C) (07/30 0450) Pulse Rate:  [61-128]  78 (07/30 0500) Resp:  [16-28] 20 (07/30 0500) BP: (104-191)/(57-98) 104/57 mmHg (07/30 0500) SpO2:  [79 %-100 %] 94 % (07/30 0500) Weight:  [87.2 kg (192 lb 3.9 oz)] 87.2 kg (192 lb 3.9 oz) (07/30 0421)  Intake/Output from previous day: 07/29 0701 - 07/30 0700 In: 1540 [P.O.:840; I.V.:300; IV Piggyback:400] Out: 900 [Urine:900] Intake/Output this shift:   Nutritional status:     Lab Results: Results for orders placed during the hospital encounter of 06/28/13 (from the past 48 hour(s))  GLUCOSE, CAPILLARY     Status: Abnormal   Collection Time    07/01/13  8:23 AM      Result Value Range   Glucose-Capillary 126 (*) 70 - 99 mg/dL   Comment 1 Notify RN    VITAMIN B12     Status: None   Collection Time    07/01/13  8:51 AM      Result Value Range   Vitamin B-12 590  211 - 911 pg/mL  RPR     Status: None   Collection Time    07/01/13  8:51 AM      Result Value Range   RPR NON REACTIVE  NON REACTIVE  HOMOCYSTEINE     Status: None   Collection Time    07/01/13  8:51 AM      Result Value Range   Homocysteine 12.6  4.0 - 15.4 umol/L  PHENYTOIN LEVEL, TOTAL     Status: Abnormal   Collection Time  07/01/13  8:51 AM      Result Value Range   Phenytoin Lvl 2.5 (*) 10.0 - 20.0 ug/mL  GLUCOSE, CAPILLARY     Status: Abnormal   Collection Time    07/01/13 12:13 PM      Result Value Range   Glucose-Capillary 114 (*) 70 - 99 mg/dL   Comment 1 Notify RN    GLUCOSE, CAPILLARY     Status: Abnormal   Collection Time    07/01/13  5:03 PM      Result Value Range   Glucose-Capillary 126 (*) 70 - 99 mg/dL   Comment 1 Notify RN    GLUCOSE, CAPILLARY     Status: Abnormal   Collection Time    07/01/13  9:05 PM      Result Value Range   Glucose-Capillary 128 (*) 70 - 99 mg/dL   Comment 1 Documented in Chart     Comment 2 Notify RN    GLUCOSE, CAPILLARY     Status: Abnormal   Collection Time    07/02/13  7:21 AM      Result Value Range   Glucose-Capillary 113 (*) 70 - 99 mg/dL    Comment 1 Documented in Chart     Comment 2 Notify RN    BASIC METABOLIC PANEL     Status: Abnormal   Collection Time    07/02/13  8:08 AM      Result Value Range   Sodium 140  135 - 145 mEq/L   Potassium 2.9 (*) 3.5 - 5.1 mEq/L   Chloride 105  96 - 112 mEq/L   CO2 25  19 - 32 mEq/L   Glucose, Bld 128 (*) 70 - 99 mg/dL   BUN 10  6 - 23 mg/dL   Creatinine, Ser 1.61  0.50 - 1.10 mg/dL   Calcium 9.7  8.4 - 09.6 mg/dL   GFR calc non Af Amer 55 (*) >90 mL/min   GFR calc Af Amer 63 (*) >90 mL/min   Comment:            The eGFR has been calculated     using the CKD EPI equation.     This calculation has not been     validated in all clinical     situations.     eGFR's persistently     <90 mL/min signify     possible Chronic Kidney Disease.  GLUCOSE, CAPILLARY     Status: Abnormal   Collection Time    07/02/13 11:14 AM      Result Value Range   Glucose-Capillary 116 (*) 70 - 99 mg/dL   Comment 1 Documented in Chart     Comment 2 Notify RN    GLUCOSE, CAPILLARY     Status: Abnormal   Collection Time    07/02/13  4:29 PM      Result Value Range   Glucose-Capillary 108 (*) 70 - 99 mg/dL   Comment 1 Documented in Chart     Comment 2 Notify RN    GLUCOSE, CAPILLARY     Status: Abnormal   Collection Time    07/02/13 10:29 PM      Result Value Range   Glucose-Capillary 110 (*) 70 - 99 mg/dL   Comment 1 Notify RN      Lipid Panel No results found for this basename: CHOL, TRIG, HDL, CHOLHDL, VLDL, LDLCALC,  in the last 72 hours  Studies/Results: US Carotid Duplex Bilateral  07/01/2013   *RADIOLOGY  REPORT*  Clinical Data: Hypertension, diabetes, elevated cholesterol  BILATERAL CAROTID DUPLEX ULTRASOUND  Technique: Gray scale imaging, color Doppler and duplex ultrasound were performed of bilateral carotid and vertebral arteries in the neck.  Comparison:  None.  Criteria:  Quantification of carotid stenosis is based on velocity parameters that correlate the residual internal carotid  diameter with NASCET-based stenosis levels, using the diameter of the distal internal carotid lumen as the denominator for stenosis measurement.  The following velocity measurements were obtained:                   PEAK SYSTOLIC/END DIASTOLIC RIGHT ICA:                        100/12cm/sec CCA:                        63/6cm/sec SYSTOLIC ICA/CCA RATIO:     1.60 DIASTOLIC ICA/CCA RATIO:    2.00 ECA:                        96cm/sec  LEFT ICA:                        65/12cm/sec CCA:                        81/15cm/sec SYSTOLIC ICA/CCA RATIO:     0.80 DIASTOLIC ICA/CCA RATIO:    0.85 ECA:                        70cm/sec  Findings:  RIGHT CAROTID ARTERY: Minor right carotid system plaque formation. No hemodynamically significant right ICA stenosis, velocity elevation, turbulent flow.  RIGHT VERTEBRAL ARTERY:  Antegrade  LEFT CAROTID ARTERY: Similar minor plaque formation.  No hemodynamically significant left ICA stenosis, velocity elevation, or turbulent flow.  LEFT VERTEBRAL ARTERY:  Antegrade  IMPRESSION: Minor carotid plaque formation.  No hemodynamically significant ICA stenosis by ultrasound   Original Report Authenticated By: Judie Petit. Miles Costain, M.D.   Dg Chest Port 1 View  07/01/2013   *RADIOLOGY REPORT*  Clinical Data: PICC line placement  PORTABLE CHEST - 1 VIEW  Comparison: 06/28/2013  Findings: Left-sided PICC line tip terminates over the mid SVC. Evidence of CABG noted.  Right-sided dual lead pacer in place. Probable skin fold over the left lung apex.  No evidence of pneumothorax.  Technique was optimized for detection of the recently placed line.  Cardiomegaly noted without evidence for edema.  IMPRESSION: Left-sided PICC line with tip over the mid SVC, no pneumothorax.   Original Report Authenticated By: Christiana Pellant, M.D.    Medications:  Scheduled Meds: . amLODipine  10 mg Oral Daily  . aspirin EC  81 mg Oral Daily  . atorvastatin  40 mg Oral QPC supper  . benazepril  20 mg Oral Daily  . enoxaparin  (LOVENOX) injection  40 mg Subcutaneous Q24H  . estradiol  0.5 mg Oral Daily  . hydrALAZINE  25 mg Oral Q8H  . insulin aspart  0-5 Units Subcutaneous QHS  . insulin aspart  0-9 Units Subcutaneous TID WC  . ketorolac  1 drop Right Eye QID  . metFORMIN  500 mg Oral BID WC  . metoprolol tartrate  12.5 mg Oral BID  . ondansetron  4 mg Oral TID WC & HS  . pantoprazole  40 mg Oral BID  AC  . phenytoin  300 mg Oral QHS  . potassium chloride  20 mEq Oral Daily  . sodium chloride  10-40 mL Intracatheter Q12H   Continuous Infusions: . sodium chloride 50 mL/hr at 07/02/13 1500  . 0.9 % NaCl with KCl 20 mEq / L 50 mL/hr at 07/02/13 1758   PRN Meds:.acetaminophen, diazepam, magnesium hydroxide, metoprolol, nitroGLYCERIN, ondansetron (ZOFRAN) IV, promethazine, sodium chloride, traZODone    LOS: 5 days   Garo Heidelberg A. [name], M.D.  Diplomate, Biomedical engineer of Psychiatry and Neurology ( Neurology).

## 2013-07-03 NOTE — Progress Notes (Signed)
Subjective: Mild nausea but much improved. No vomiting. No abdominal pain. Tolerated breakfast.   Objective: Vital signs in last 24 hours: Temp:  [98.4 F (36.9 C)-98.9 F (37.2 C)] 98.4 F (36.9 C) (07/30 0450) Pulse Rate:  [61-78] 78 (07/30 0500) Resp:  [16-25] 20 (07/30 0500) BP: (104-178)/(57-98) 165/94 mmHg (07/30 0758) SpO2:  [94 %-100 %] 94 % (07/30 0500) Weight:  [192 lb 3.9 oz (87.2 kg)] 192 lb 3.9 oz (87.2 kg) (07/30 0421) Last BM Date: 07/01/13 General:   Alert and oriented, pleasant Head:  Normocephalic and atraumatic. Eyes:  No icterus, sclera clear. Conjuctiva pink.  Heart:  S1, S2 present Lungs: Clear to auscultation bilaterally, without wheezing, rales, or rhonchi.  Abdomen:  Bowel sounds present, soft, non-tender, non-distended. No HSM or hernias noted. No rebound or guarding. No masses appreciated    Intake/Output from previous day: 07/29 0701 - 07/30 0700 In: 1540 [P.O.:840; I.V.:300; IV Piggyback:400] Out: 900 [Urine:900] Intake/Output this shift:    Lab Results:  Recent Labs  06/30/13 0949 07/01/13 0438  WBC 12.9* 16.5*  HGB 11.4* 10.2*  HCT 33.2* 31.0*  PLT 280 237   BMET  Recent Labs  06/30/13 0949 07/01/13 0438 07/02/13 0808  NA 138 137 140  K 3.0* 2.7* 2.9*  CL 97 99 105  CO2 23 26 25   GLUCOSE 163* 135* 128*  BUN 8 12 10   CREATININE 0.92 1.19* 0.99  CALCIUM 10.8* 9.8 9.7   PT/INR  Recent Labs  06/30/13 0949  LABPROT 14.4  INR 1.14      Assessment: 72 year old female with history of PUD and gastroparesis, now admitted secondary to delirium, NSTEMI with consults to include Neurology and Cardiology. Her last EGD was March 2014 by Dr. Jena Gauss, noting healed gastric ulcer. She has known gastroparesis, which is likely the culprit of intermittent nausea and vomiting. Doing well with PPI BID and addition of Zofran at meals and bedtime. NOT a candidate for Reglan due to prior adverse side effects. Stable from a GI standpoint. Will  follow peripherally with yo.     Plan: Continue PPI BID Continue Zofran as scheduled Follow peripherally Outpatient follow-up in our office  Nira Retort, ANP-BC [facility]  9:48 AM   LOS: 5 days    07/03/2013, 9:46 AM

## 2013-07-03 NOTE — Progress Notes (Addendum)
Discharge instructions given to daughter, verbalized understanding.  PICC line removed from LUA without difficulty, vaseline gauze applied with 4x4's, pressure held x 2 minutes, secured with hypafix tape, instructed to leave dressing in place and not get wet x 24 hours, verbalized understanding.

## 2013-07-04 NOTE — Discharge Summary (Signed)
Physician Discharge Summary  Patient ID: [name] MRN: [id number] DOB/AGE: 1941/04/11 72 y.o. Primary Care Physician:Mickey Hebel L, MD Admit date: 06/28/2013 Discharge date: 07/04/2013    Discharge Diagnoses:  Gastroparesis Active Problems:   Chronic diastolic heart failure   Diabetes mellitus, type II   Hypertension   Obesity   COPD (chronic obstructive pulmonary disease)   Osteoarthritis of left hip   Acute delirium   Unspecified constipation   NSTEMI (non-ST elevated myocardial infarction)     Medication List    STOP taking these medications       phenytoin 100 MG ER capsule  Commonly known as:  DILANTIN      TAKE these medications       amLODipine 10 MG tablet  Commonly known as:  NORVASC  Take 10 mg by mouth daily.     aspirin EC 81 MG tablet  Take 81 mg by mouth daily.     atorvastatin 40 MG tablet  Commonly known as:  LIPITOR  Take 40 mg by mouth daily.     benazepril 40 MG tablet  Commonly known as:  LOTENSIN  Take 1 tablet (40 mg total) by mouth daily.     BROMDAY 0.09 % Soln  Generic drug:  Bromfenac Sodium  Place 1 drop into the right eye 3 (three) times daily.     doxepin 25 MG capsule  Commonly known as:  SINEQUAN  Take 25 mg by mouth at bedtime.     estradiol 0.5 MG tablet  Commonly known as:  ESTRACE  Take 0.5 mg by mouth daily.     fentaNYL 100 MCG/HR  Commonly known as:  DURAGESIC - dosed mcg/hr  Place 1 patch onto the skin every 3 (three) days.     hydrALAZINE 25 MG tablet  Commonly known as:  APRESOLINE  Take 25 mg by mouth every 8 (eight) hours.     levETIRAcetam 250 MG tablet  Commonly known as:  KEPPRA  Take 1 tablet (250 mg total) by mouth 2 (two) times daily.     magnesium hydroxide 400 MG/5ML suspension  Commonly known as:  MILK OF MAGNESIA  Takes couple of tablespoons daily.     metFORMIN 500 MG tablet  Commonly known as:  GLUCOPHAGE  Take 500 mg by mouth 2 (two) times daily with a meal.     metoprolol  tartrate 25 MG tablet  Commonly known as:  LOPRESSOR  Take 12.5 mg by mouth 2 (two) times daily.     nitroGLYCERIN 0.4 MG SL tablet  Commonly known as:  NITROSTAT  Place 0.4 mg under the tongue every 5 (five) minutes as needed for chest pain.     pantoprazole 40 MG tablet  Commonly known as:  PROTONIX  Take 1 tablet (40 mg total) by mouth 2 (two) times daily.     potassium chloride 10 MEQ tablet  Commonly known as:  K-DUR  Take 2 tablets (20 mEq total) by mouth daily.     senna-docusate 8.6-50 MG per tablet  Commonly known as:  Senokot-S  Take 1 tablet by mouth daily as needed for constipation.     traMADol 50 MG tablet  Commonly known as:  ULTRAM  Take 50 mg by mouth every 6 (six) hours as needed for pain.        Discharged Condition: Improved    Consults: GI neurology cardiology  Significant Diagnostic Studies: Ct Head Wo Contrast  06/30/2013   *RADIOLOGY REPORT*  Clinical Data: Stroke  CT  HEAD WITHOUT CONTRAST  Technique:  Contiguous axial images were obtained from the base of the skull through the vertex without contrast.  Comparison: 06/28/2013  Findings: Mild chronic ischemic changes in the periventricular white matter.  Mild global atrophy appropriate to age.  No mass effect, midline shift, or acute intracranial hemorrhage.  Cranium is intact.  Mastoid air cells are clear.  IMPRESSION: No acute intracranial pathology.  Chronic ischemic changes and atrophy are noted.   Original Report Authenticated By: Jolaine Click, M.D.   Ct Head Wo Contrast  06/28/2013   *RADIOLOGY REPORT*  Clinical Data: Mental status change, history hypertension, diabetes, seizures, COPD, vascular disease  CT HEAD WITHOUT CONTRAST  Technique:  Contiguous axial images were obtained from the base of the skull through the vertex without contrast.  Comparison: None  Findings: Generalized atrophy. Normal ventricular morphology. No midline shift or mass effect. Small vessel chronic ischemic changes of deep  cerebral white matter. No intracranial hemorrhage, mass lesion, or evidence of acute infarction. No extra-axial fluid collections. Atherosclerotic calcifications at skull base. No acute bone or sinus abnormality.  IMPRESSION: Atrophy with small vessel chronic ischemic changes of deep cerebral white matter. No acute intracranial abnormalities.   Original Report Authenticated By: Ulyses Southward, M.D.   Ct Abdomen Pelvis W Contrast  06/29/2013   *RADIOLOGY REPORT*  Clinical Data: Abdominal pain.  Confusion.  Decreased appetite and epigastric pain.  Constipation.  CT ABDOMEN AND PELVIS WITH CONTRAST  Technique:  Multidetector CT imaging of the abdomen and pelvis was performed following the standard protocol during bolus administration of intravenous contrast.  Contrast: 50mL OMNIPAQUE IOHEXOL 300 MG/ML  SOLN, OMNIPAQUE IOHEXOL 300 MG/ML  SOLN  Comparison: 09/16/2012  Findings: Atelectasis or fibrosis in the lung bases.  Cardiac enlargement.  Surgical absence of the gallbladder.  Small esophageal hiatal hernia.  Gastric wall is not thickened.  Small bowel are not abnormally distended.  No bowel wall thickening.  Stool filled colon without distension or wall thickening.  The liver, spleen, pancreas, adrenal glands, kidneys, inferior vena cava, and retroperitoneal lymph nodes are unremarkable. Calcification of abdominal aorta without aneurysm.  No free fluid or free air in the abdomen.  Prominent visceral adipose tissues. Umbilical/periumbilical hernia containing fat.  Scattered surgical clips in the abdominal wall.  Pelvis:  Surgical clips in the right groin/inguinal region. Visualization of portions of the pelvis is limited due to streak artifact from right hip prosthesis.  The bladder wall appears mildly thickened.  This could be due to cystitis.  The uterus appears to be surgically absent.  No abnormal adnexal masses. Stool filled rectosigmoid colon.  No evidence of diverticulitis. The appendix is not identified.   No free or loculated pelvic fluid collections.  No significant pelvic lymphadenopathy.  Postoperative and degenerative changes in the lumbar spine.  There is anterior subluxation of L4 on L5 but this appears stable since previous study.  No destructive bone lesions appreciated.  Degenerative disc disease at L2-3.  IMPRESSION: Suggestion of mild bladder wall thickening which could represent cystitis.  No evidence of bowel obstruction.  No acute processes otherwise identified.   Original Report Authenticated By: Burman Nieves, M.D.   US Carotid Duplex Bilateral  07/01/2013   *RADIOLOGY REPORT*  Clinical Data: Hypertension, diabetes, elevated cholesterol  BILATERAL CAROTID DUPLEX ULTRASOUND  Technique: Gray scale imaging, color Doppler and duplex ultrasound were performed of bilateral carotid and vertebral arteries in the neck.  Comparison:  None.  Criteria:  Quantification of carotid stenosis is based  on velocity parameters that correlate the residual internal carotid diameter with NASCET-based stenosis levels, using the diameter of the distal internal carotid lumen as the denominator for stenosis measurement.  The following velocity measurements were obtained:                   PEAK SYSTOLIC/END DIASTOLIC RIGHT ICA:                        100/12cm/sec CCA:                        63/6cm/sec SYSTOLIC ICA/CCA RATIO:     1.60 DIASTOLIC ICA/CCA RATIO:    2.00 ECA:                        96cm/sec  LEFT ICA:                        65/12cm/sec CCA:                        81/15cm/sec SYSTOLIC ICA/CCA RATIO:     0.80 DIASTOLIC ICA/CCA RATIO:    0.85 ECA:                        70cm/sec  Findings:  RIGHT CAROTID ARTERY: Minor right carotid system plaque formation. No hemodynamically significant right ICA stenosis, velocity elevation, turbulent flow.  RIGHT VERTEBRAL ARTERY:  Antegrade  LEFT CAROTID ARTERY: Similar minor plaque formation.  No hemodynamically significant left ICA stenosis, velocity elevation, or turbulent  flow.  LEFT VERTEBRAL ARTERY:  Antegrade  IMPRESSION: Minor carotid plaque formation.  No hemodynamically significant ICA stenosis by ultrasound   Original Report Authenticated By: Judie Petit. Miles Costain, M.D.   Dg Chest Port 1 View  07/01/2013   *RADIOLOGY REPORT*  Clinical Data: PICC line placement  PORTABLE CHEST - 1 VIEW  Comparison: 06/28/2013  Findings: Left-sided PICC line tip terminates over the mid SVC. Evidence of CABG noted.  Right-sided dual lead pacer in place. Probable skin fold over the left lung apex.  No evidence of pneumothorax.  Technique was optimized for detection of the recently placed line.  Cardiomegaly noted without evidence for edema.  IMPRESSION: Left-sided PICC line with tip over the mid SVC, no pneumothorax.   Original Report Authenticated By: Christiana Pellant, M.D.   Dg Abd Acute W/chest  06/28/2013   *RADIOLOGY REPORT*  Clinical Data: Constipation, confusion, vomiting, hypertension, diabetes, COPD, GERD  ACUTE ABDOMEN SERIES (ABDOMEN 2 VIEW & CHEST 1 VIEW)  Comparison: 03/03/2013  Findings: Right subclavian transvenous pacemaker leads project over right atrium and right ventricle. Enlargement of cardiac silhouette post CABG. Tortuous aorta. Pulmonary vascularity normal. Lungs clear. No pleural effusion or pneumothorax. Prior lumbar fusion. Surgical clips right upper quadrant question cholecystectomy. Prior right hip replacement. Nonobstructive bowel gas pattern. No bowel dilatation, bowel wall thickening, or free intraperitoneal air. Osteoarthritic changes left hip. Diffuse osseous demineralization.  IMPRESSION: No acute abdominal findings. Enlargement of cardiac silhouette post pacemaker and CABG.   Original Report Authenticated By: Ulyses Southward, M.D.    Lab Results: Basic Metabolic Panel:  Recent Labs  14/78/29 0808  NA 140  K 2.9*  CL 105  CO2 25  GLUCOSE 128*  BUN 10  CREATININE 0.99  CALCIUM 9.7   Liver Function Tests: No results found for this basename: AST, ALT, ALKPHOS,  BILITOT, PROT, ALBUMIN,  in the last 72 hours   CBC: No results found for this basename: WBC, NEUTROABS, HGB, HCT, MCV, PLT,  in the last 72 hours  Recent Results (from the past 240 hour(s))  URINE CULTURE     Status: None   Collection Time    06/28/13  5:46 PM      Result Value Range Status   Specimen Description URINE, CATHETERIZED   Final   Special Requests NONE   Final   Culture  Setup Time 06/29/2013 00:26   Final   Colony Count NO GROWTH   Final   Culture NO GROWTH   Final   Report Status 06/30/2013 FINAL   Final     Hospital Course: She was admitted with acute delirium. She was constipated and it was thought that her delirium might be related to that. She improved and then had increasing delirium lost IV access and became much worse. She did receive Phenergan for nausea during that period which may have increased her delirium but the delirium had started again before she received the Phenergan. She became worse with the delirium and had some neurological changes and was transferred to the intensive care unit. She had elevated troponin levels and was felt to have a non-STEMI. She did not have a definite stroke. Her delirium cleared and the cause of this was never totally elucidated. She has seizures but no definite seizure activity was seen. Because of the difficulty in monitoring her Dilantin she was switched to Keppra. She had some complaints of nausea which resolved By the time of discharge she was awake and alert able to eat and clear neurologically  Discharge Exam: Blood pressure 157/54, pulse 62, temperature 98.3 F (36.8 C), temperature source Oral, resp. rate 20, height 5\' 2"  (1.575 m), weight 87.2 kg (192 lb 3.9 oz), SpO2 97.00%. She is awake and alert. Her chest is clear. Her heart is regular. She is neurologically intact  Disposition: Home with home health services      Discharge Orders   Future Appointments Provider Department Dept Phone   07/16/2013 10:30 AM Marinus Maw, MD [facility] [place] [phone]   Future Orders Complete By Expires     Discharge patient  As directed     Face-to-face encounter (required for Medicare/Medicaid patients)  As directed     Comments:      I Adarius Tigges L certify that this patient is under my care and that I, or a nurse practitioner or physician's assistant working with me, had a face-to-face encounter that meets the physician face-to-face encounter requirements with this patient on 07/03/2013. The encounter with the patient was in whole, or in part for the following medical condition(s) which is the primary reason for home health care (List medical condition): delerium/acute mi    Questions:      The encounter with the patient was in whole, or in part, for the following medical condition, which is the primary reason for home health care:  delerium/acute mi    I certify that, based on my findings, the following services are medically necessary home health services:  Physical therapy    My clinical findings support the need for the above services:  Shortness of breath with activity    Further, I certify that my clinical findings support that this patient is homebound due to:  Shortness of Breath with activity    Reason for Medically Necessary Home Health Services:  Skilled Nursing- Change/Decline in Patient Status  Home Health  As directed     Questions:      To provide the following care/treatments:  PT    RN       Follow-up Information   Follow up with [facility]. [facility] RN and PT, will call before they come to see you)    Contact information:   [address] [place] [address] [phone]      Signed: Fredirick Maudlin Pager [phone]  07/04/2013, 8:43 AM

## 2013-07-13 ENCOUNTER — Encounter (HOSPITAL_COMMUNITY): Payer: Self-pay | Admitting: *Deleted

## 2013-07-13 ENCOUNTER — Inpatient Hospital Stay (HOSPITAL_COMMUNITY)
Admission: EM | Admit: 2013-07-13 | Discharge: 2013-07-18 | DRG: 674 | Disposition: A | Discharge status: Home Health | Payer: PRIVATE HEALTH INSURANCE | Attending: Pulmonary Disease | Admitting: Pulmonary Disease

## 2013-07-13 DIAGNOSIS — E872 Acidosis, unspecified: Secondary | ICD-10-CM | POA: Diagnosis present

## 2013-07-13 DIAGNOSIS — Z79899 Other long term (current) drug therapy: Secondary | ICD-10-CM

## 2013-07-13 DIAGNOSIS — E669 Obesity, unspecified: Secondary | ICD-10-CM | POA: Diagnosis present

## 2013-07-13 DIAGNOSIS — R410 Disorientation, unspecified: Secondary | ICD-10-CM

## 2013-07-13 DIAGNOSIS — N189 Chronic kidney disease, unspecified: Secondary | ICD-10-CM | POA: Diagnosis present

## 2013-07-13 DIAGNOSIS — J449 Chronic obstructive pulmonary disease, unspecified: Secondary | ICD-10-CM | POA: Diagnosis present

## 2013-07-13 DIAGNOSIS — E119 Type 2 diabetes mellitus without complications: Secondary | ICD-10-CM | POA: Diagnosis present

## 2013-07-13 DIAGNOSIS — N179 Acute kidney failure, unspecified: Principal | ICD-10-CM | POA: Diagnosis present

## 2013-07-13 DIAGNOSIS — E876 Hypokalemia: Secondary | ICD-10-CM | POA: Diagnosis present

## 2013-07-13 DIAGNOSIS — Z950 Presence of cardiac pacemaker: Secondary | ICD-10-CM

## 2013-07-13 DIAGNOSIS — Z6838 Body mass index (BMI) 38.0-38.9, adult: Secondary | ICD-10-CM

## 2013-07-13 DIAGNOSIS — K279 Peptic ulcer, site unspecified, unspecified as acute or chronic, without hemorrhage or perforation: Secondary | ICD-10-CM

## 2013-07-13 DIAGNOSIS — N289 Disorder of kidney and ureter, unspecified: Secondary | ICD-10-CM

## 2013-07-13 DIAGNOSIS — Z951 Presence of aortocoronary bypass graft: Secondary | ICD-10-CM

## 2013-07-13 DIAGNOSIS — R531 Weakness: Secondary | ICD-10-CM

## 2013-07-13 DIAGNOSIS — E860 Dehydration: Secondary | ICD-10-CM | POA: Diagnosis present

## 2013-07-13 DIAGNOSIS — K3184 Gastroparesis: Secondary | ICD-10-CM | POA: Diagnosis present

## 2013-07-13 DIAGNOSIS — E785 Hyperlipidemia, unspecified: Secondary | ICD-10-CM | POA: Diagnosis present

## 2013-07-13 DIAGNOSIS — D649 Anemia, unspecified: Secondary | ICD-10-CM | POA: Diagnosis present

## 2013-07-13 DIAGNOSIS — K5900 Constipation, unspecified: Secondary | ICD-10-CM | POA: Diagnosis present

## 2013-07-13 DIAGNOSIS — K219 Gastro-esophageal reflux disease without esophagitis: Secondary | ICD-10-CM | POA: Diagnosis present

## 2013-07-13 DIAGNOSIS — D638 Anemia in other chronic diseases classified elsewhere: Secondary | ICD-10-CM | POA: Diagnosis present

## 2013-07-13 DIAGNOSIS — I129 Hypertensive chronic kidney disease with stage 1 through stage 4 chronic kidney disease, or unspecified chronic kidney disease: Secondary | ICD-10-CM | POA: Diagnosis present

## 2013-07-13 DIAGNOSIS — G40909 Epilepsy, unspecified, not intractable, without status epilepticus: Secondary | ICD-10-CM | POA: Diagnosis present

## 2013-07-13 DIAGNOSIS — M1990 Unspecified osteoarthritis, unspecified site: Secondary | ICD-10-CM | POA: Diagnosis present

## 2013-07-13 DIAGNOSIS — J4489 Other specified chronic obstructive pulmonary disease: Secondary | ICD-10-CM | POA: Diagnosis present

## 2013-07-13 DIAGNOSIS — G4730 Sleep apnea, unspecified: Secondary | ICD-10-CM | POA: Diagnosis present

## 2013-07-13 DIAGNOSIS — I10 Essential (primary) hypertension: Secondary | ICD-10-CM | POA: Diagnosis present

## 2013-07-13 DIAGNOSIS — I2510 Atherosclerotic heart disease of native coronary artery without angina pectoris: Secondary | ICD-10-CM | POA: Diagnosis present

## 2013-07-13 DIAGNOSIS — IMO0002 Reserved for concepts with insufficient information to code with codable children: Secondary | ICD-10-CM | POA: Diagnosis present

## 2013-07-13 DIAGNOSIS — Z96649 Presence of unspecified artificial hip joint: Secondary | ICD-10-CM

## 2013-07-13 DIAGNOSIS — R5383 Other fatigue: Secondary | ICD-10-CM

## 2013-07-13 DIAGNOSIS — Z96659 Presence of unspecified artificial knee joint: Secondary | ICD-10-CM

## 2013-07-13 DIAGNOSIS — R4182 Altered mental status, unspecified: Secondary | ICD-10-CM | POA: Diagnosis present

## 2013-07-13 DIAGNOSIS — R5381 Other malaise: Secondary | ICD-10-CM

## 2013-07-13 LAB — URINALYSIS, ROUTINE W REFLEX MICROSCOPIC
Bilirubin Urine: NEGATIVE
Glucose, UA: NEGATIVE mg/dL
Hgb urine dipstick: NEGATIVE
Specific Gravity, Urine: 1.015 (ref 1.005–1.030)
Urobilinogen, UA: 0.2 mg/dL (ref 0.0–1.0)

## 2013-07-13 LAB — CBC WITH DIFFERENTIAL/PLATELET
Eosinophils Relative: 2 % (ref 0–5)
HCT: 23.6 % — ABNORMAL LOW (ref 36.0–46.0)
Hemoglobin: 7.9 g/dL — ABNORMAL LOW (ref 12.0–15.0)
MCH: 28.9 pg (ref 26.0–34.0)
MCHC: 33.5 g/dL (ref 30.0–36.0)
MCV: 86.4 fL (ref 78.0–100.0)
Monocytes Absolute: 0.7 10*3/uL (ref 0.1–1.0)
Monocytes Relative: 9 % (ref 3–12)
RDW: 15 % (ref 11.5–15.5)

## 2013-07-13 LAB — CBC
HCT: 22.7 % — ABNORMAL LOW (ref 36.0–46.0)
Hemoglobin: 7.9 g/dL — ABNORMAL LOW (ref 12.0–15.0)
Hemoglobin: 7.8 g/dL — ABNORMAL LOW (ref 12.0–15.0)
MCV: 86 fL (ref 78.0–100.0)
Platelets: 162 10*3/uL (ref 150–400)
RBC: 2.77 MIL/uL — ABNORMAL LOW (ref 3.87–5.11)
RBC: 2.64 MIL/uL — ABNORMAL LOW (ref 3.87–5.11)
WBC: 7.1 10*3/uL (ref 4.0–10.5)
WBC: 7.8 10*3/uL (ref 4.0–10.5)

## 2013-07-13 LAB — COMPREHENSIVE METABOLIC PANEL
ALT: 13 U/L (ref 0–35)
AST: 19 U/L (ref 0–37)
Alkaline Phosphatase: 77 U/L (ref 39–117)
CO2: 18 mEq/L — ABNORMAL LOW (ref 19–32)
GFR calc Af Amer: 11 mL/min — ABNORMAL LOW (ref >90)
GFR calc non Af Amer: 9 mL/min — ABNORMAL LOW (ref >90)
Glucose, Bld: 108 mg/dL — ABNORMAL HIGH (ref 70–99)
Potassium: 4.7 mEq/L (ref 3.5–5.1)
Sodium: 133 mEq/L — ABNORMAL LOW (ref 135–145)

## 2013-07-13 LAB — GLUCOSE, CAPILLARY: Glucose-Capillary: 166 mg/dL — ABNORMAL HIGH (ref 70–99)

## 2013-07-13 MED ORDER — KETOROLAC TROMETHAMINE 0.5 % OP SOLN
1.0000 [drp] | Freq: Three times a day (TID) | OPHTHALMIC | Status: DC
  Administered 2013-07-13 – 2013-07-17 (×11): 1 [drp] via OPHTHALMIC
  Filled 2013-07-13: qty 3

## 2013-07-13 MED ORDER — ATORVASTATIN CALCIUM 40 MG PO TABS
40.0000 mg | ORAL_TABLET | Freq: Every day | ORAL | Status: DC
  Administered 2013-07-13 – 2013-07-17 (×5): 40 mg via ORAL
  Filled 2013-07-13 (×5): qty 1

## 2013-07-13 MED ORDER — METOPROLOL TARTRATE 25 MG PO TABS
12.5000 mg | ORAL_TABLET | Freq: Two times a day (BID) | ORAL | Status: DC
  Administered 2013-07-13 – 2013-07-17 (×10): 12.5 mg via ORAL
  Filled 2013-07-13 (×10): qty 1

## 2013-07-13 MED ORDER — ESTRADIOL 1 MG PO TABS
0.5000 mg | ORAL_TABLET | Freq: Every day | ORAL | Status: DC
  Administered 2013-07-13 – 2013-07-17 (×5): 0.5 mg via ORAL
  Filled 2013-07-13 (×8): qty 0.5

## 2013-07-13 MED ORDER — PANTOPRAZOLE SODIUM 40 MG PO TBEC: 40.0000 mg | DELAYED_RELEASE_TABLET | Freq: Two times a day (BID) | ORAL | Status: DC

## 2013-07-13 MED ORDER — LEVETIRACETAM 500 MG PO TABS
250.0000 mg | ORAL_TABLET | Freq: Two times a day (BID) | ORAL | Status: DC
  Administered 2013-07-13 – 2013-07-17 (×9): 250 mg via ORAL
  Filled 2013-07-13 (×11): qty 1

## 2013-07-13 MED ORDER — METFORMIN HCL 500 MG PO TABS: 500.0000 mg | ORAL_TABLET | Freq: Two times a day (BID) | ORAL | Status: DC

## 2013-07-13 MED ORDER — INSULIN ASPART 100 UNIT/ML ~~LOC~~ SOLN: 0.0000 [IU] | Freq: Every day | SUBCUTANEOUS | Status: DC

## 2013-07-13 MED ORDER — HYDRALAZINE HCL 25 MG PO TABS
25.0000 mg | ORAL_TABLET | Freq: Three times a day (TID) | ORAL | Status: DC
  Administered 2013-07-13 – 2013-07-18 (×13): 25 mg via ORAL
  Filled 2013-07-13 (×14): qty 1

## 2013-07-13 MED ORDER — DOXEPIN HCL 25 MG PO CAPS
25.0000 mg | ORAL_CAPSULE | Freq: Every day | ORAL | Status: DC
  Administered 2013-07-13 – 2013-07-17 (×5): 25 mg via ORAL
  Filled 2013-07-13 (×7): qty 1

## 2013-07-13 MED ORDER — AMLODIPINE BESYLATE 5 MG PO TABS
10.0000 mg | ORAL_TABLET | Freq: Every day | ORAL | Status: DC
  Administered 2013-07-13 – 2013-07-17 (×5): 10 mg via ORAL
  Filled 2013-07-13 (×5): qty 2

## 2013-07-13 MED ORDER — PANTOPRAZOLE SODIUM 40 MG IV SOLR
40.0000 mg | Freq: Two times a day (BID) | INTRAVENOUS | Status: DC
  Administered 2013-07-13 – 2013-07-16 (×7): 40 mg via INTRAVENOUS
  Filled 2013-07-13 (×7): qty 40

## 2013-07-13 MED ORDER — TRAMADOL HCL 50 MG PO TABS: 50.0000 mg | ORAL_TABLET | Freq: Four times a day (QID) | ORAL | Status: DC | PRN

## 2013-07-13 MED ORDER — PANTOPRAZOLE SODIUM 40 MG IV SOLR: 40.0000 mg | INTRAVENOUS | Status: DC

## 2013-07-13 MED ORDER — BROMFENAC SODIUM (ONCE-DAILY) 0.09 % OP SOLN: 1.0000 [drp] | Freq: Three times a day (TID) | OPHTHALMIC | Status: DC

## 2013-07-13 MED ORDER — NITROGLYCERIN 0.4 MG SL SUBL: 0.4000 mg | SUBLINGUAL_TABLET | SUBLINGUAL | Status: DC | PRN

## 2013-07-13 MED ORDER — SODIUM CHLORIDE 0.9 % IV SOLN
INTRAVENOUS | Status: DC
  Administered 2013-07-13 – 2013-07-16 (×6): via INTRAVENOUS

## 2013-07-13 MED ORDER — INSULIN ASPART 100 UNIT/ML ~~LOC~~ SOLN
0.0000 [IU] | Freq: Three times a day (TID) | SUBCUTANEOUS | Status: DC
  Administered 2013-07-13: 1 [IU] via SUBCUTANEOUS

## 2013-07-13 NOTE — ED Notes (Signed)
Pt brought to er by family with c/o continued confusion, weakness, not eating, constipation, was recently admitted to hospital for same symptoms, family states that pt was better for about two days but then the symptoms started returning. Pt alert to place, confused on day and month. When I asked pt about symptoms pt states that she does have pain to left lower back area. Has has trouble walking for over a year and came to er this am because it was raining.

## 2013-07-13 NOTE — ED Provider Notes (Signed)
CSN: [id number]     Arrival date & time 07/13/13  4098 History    This chart was scribed for Ward Givens, MD,  by Ashley Jacobs, ED Scribe. The patient was seen in room APA14/APA14 and the patient's care was started at  8:05 AM.   Chief Complaint  Patient presents with  . Fatigue   (Consider location/radiation/quality/duration/timing/severity/associated sxs/prior Treatment) The history is provided by the patient and medical records. No language interpreter was used.   Level V caveat for confusion  HPI Comments: [name] is a 72 y.o. female who presents to the Emergency Department complaining of fatigue the morning PTA. Pt brought  to the emergency department by her family. She was recently admitted to the hospital at the end of July for confusion and constipation. She had a diagnosis of delirium which improved while hospitalized. It was thought that maybe it had been from her constipation. Her family reports since she's been home she is not eating and her confusion seems to be worse.Family also reports she's not walking although patient states she's walking. She denies abdominal pain or chest pain. She complains of pain in her left hip in which she has known osteoarthritis in that hip. She does not complain of epigastric pain but during the course of my exam she was tender there. She denies nausea or vomiting. She does admit to loss of appetite. She denies any urinary symptoms. Family brought her to the ED for loss of appetite, not being ambulatory and for increasing confusion since she was discharged from the hospital .   PCP Dr Juanetta Gosling Cardiology Dr Ladona Ridgel  Past Medical History  Diagnosis Date  . Diabetes mellitus, type II   . Hypertension   . Arteriosclerotic cardiovascular disease (ASCVD)     multivessel, CABG 6/10.  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  Myoview (11/20/11) showed no ischemia & EF 62%  . Gastroesophageal reflux disease     Hiatal hernia  . Hyperlipidemia   . Deep vein  thrombosis, upper right extremity     post-PPM  . Obesity   . DDD (degenerative disc disease)   . Dehiscence of closure of sternum or sternotomy 07/2009    sternal infection; required flap closure  . COPD (chronic obstructive pulmonary disease)     severe lung disease by PFTs 6/12  . Sleep apnea   . Seizure disorder   . 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 Pacemaker placement  1993    Status post DDD pacemaker implantation in 1993, with Medtronic Kappa generator change in 2003 for  second-degree AV block, most recent gen change by Fawn Kirk 04/14/11  . Revision total hip arthroplasty    . Replacement total knee      Left  . Thyroid surgery    . Wrist surgery    . 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 Reconstructive repair sternal      for infection s/p CABG 8/10  . Back surgery    . Esophagogastroduodenoscopy      with [facility] dilation,  biopsy, and disruption Schatzki's ring  . Colonoscopy  04/27/2005    hemorrhoids, diverticula  . Esophagogastroduodenoscopy  09/10/2012    RMR: Noncritical Schatzki's ring. Diffuse gastric erosions and an  area of partially healing ulceration-status post biopsy/ Small hiatal hernia. Bx reactive gastropathy.  Marland Kitchen  Abdominal hysterectomy    . Colonoscopy with esophagogastroduodenoscopy (egd) N/A 02/20/2013    RMR: pancolonic diverticulosis and internal hemorrhoids. EGD with small hiatal hernia, healed gastric ulcer   Family History  Problem Relation Age of Onset  . Diabetes    . Heart disease Mother     MI  . Coronary artery disease      Female <55  . Cancer    . Colon cancer Father     age 74   History  Substance Use Topics  . Smoking status: Never Smoker   . Smokeless tobacco: Never Used  . Alcohol Use: No   Lives at home Lives with husband and daughter  OB History   Grav Para Term Preterm  Abortions TAB SAB Ect Mult Living                 Review of Systems  Allergies  Codeine; Other; Oxycodone hcl; and Reglan  Home Medications   Current Outpatient Rx  Name  Route  Sig  Dispense  Refill  . amLODipine (NORVASC) 10 MG tablet   Oral   Take 10 mg by mouth daily.         Marland Kitchen aspirin EC 81 MG tablet   Oral   Take 81 mg by mouth daily.         Marland Kitchen atorvastatin (LIPITOR) 40 MG tablet   Oral   Take 40 mg by mouth daily.         . benazepril (LOTENSIN) 40 MG tablet   Oral   Take 1 tablet (40 mg total) by mouth daily.   30 tablet   12   . Bromfenac Sodium (BROMDAY) 0.09 % SOLN   Right Eye   Place 1 drop into the right eye 3 (three) times daily.         Marland Kitchen doxepin (SINEQUAN) 25 MG capsule   Oral   Take 25 mg by mouth at bedtime.         Marland Kitchen estradiol (ESTRACE) 0.5 MG tablet   Oral   Take 0.5 mg by mouth daily.           . fentaNYL (DURAGESIC - DOSED MCG/HR) 100 MCG/HR   Transdermal   Place 1 patch onto the skin every 3 (three) days.         . hydrALAZINE (APRESOLINE) 25 MG tablet   Oral   Take 25 mg by mouth every 8 (eight) hours.         . levETIRAcetam (KEPPRA) 250 MG tablet   Oral   Take 1 tablet (250 mg total) by mouth 2 (two) times daily.   60 tablet   12   . magnesium hydroxide (MILK OF MAGNESIA) 400 MG/5ML suspension      Takes couple of tablespoons daily.         . metFORMIN (GLUCOPHAGE) 500 MG tablet   Oral   Take 500 mg by mouth 2 (two) times daily with a meal.          . metoprolol tartrate (LOPRESSOR) 25 MG tablet   Oral   Take 12.5 mg by mouth 2 (two) times daily.         . nitroGLYCERIN (NITROSTAT) 0.4 MG SL tablet   Sublingual   Place 0.4 mg under the tongue every 5 (five) minutes as needed for chest pain.          . pantoprazole (PROTONIX) 40 MG tablet   Oral   Take 1 tablet (40 mg  total) by mouth 2 (two) times daily.   60 tablet   11   . potassium chloride (K-DUR) 10 MEQ tablet   Oral   Take 2 tablets  (20 mEq total) by mouth daily.   60 tablet   3   . senna-docusate (SENOKOT-S) 8.6-50 MG per tablet   Oral   Take 1 tablet by mouth daily as needed for constipation.          . traMADol (ULTRAM) 50 MG tablet   Oral   Take 50 mg by mouth every 6 (six) hours as needed for pain.          BP 119/91  Pulse 72  Temp(Src) 99 F (37.2 C) (Oral)  Resp 20  SpO2 97%  Vital signs normal except low grade temp.  Physical Exam  Nursing note and vitals reviewed. Constitutional: She appears well-developed and well-nourished.  Non-toxic appearance. She does not appear ill. No distress.  HENT:  Head: Normocephalic and atraumatic.  Right Ear: External ear normal.  Left Ear: External ear normal.  Nose: Nose normal. No mucosal edema or rhinorrhea.  Mouth/Throat: Oropharynx is clear and moist and mucous membranes are normal. No dental abscesses or edematous.  tounge is dry  Eyes: Conjunctivae and EOM are normal. Pupils are equal, round, and reactive to light.  Pale conjunctiva   Neck: Normal range of motion and full passive range of motion without pain. Neck supple.  Cardiovascular: Normal rate, regular rhythm and normal heart sounds.  Exam reveals no gallop and no friction rub.   No murmur heard. Pulmonary/Chest: Effort normal and breath sounds normal. No respiratory distress. She has no wheezes. She has no rhonchi. She has no rales. She exhibits no tenderness and no crepitus.  Abdominal: Soft. Normal appearance and bowel sounds are normal. She exhibits no distension. There is tenderness. There is no rebound and no guarding.  Epigastric discomfort   Genitourinary: Guaiac negative stool.  Old hemorrhoid tags, there is no stool in the vault, Hemoccult is negative  Musculoskeletal: Normal range of motion. She exhibits no edema and no tenderness.  Legs are swollen but non pitting S/p bilateral TKR   Neurological: She is alert. She has normal strength. No cranial nerve deficit.  Grips  strong, appears diffusely weak in her legs  Skin: Skin is warm, dry and intact. No rash noted. No erythema. There is pallor.  Psychiatric: She has a normal mood and affect. Her speech is normal and behavior is normal. Her mood appears not anxious.    ED Course      DIAGNOSTIC STUDIES: Oxygen Saturation is 97% on [place], adequate by my interpretation.    COORDINATION OF CARE: 8:09 AM Discussed course of care with pt. Pt understands and agrees.   Procedures (including critical care time)  Pt was gotten up by staff and with 2 staff members and a walker could only walk very short distance before she c/o being too weak to continue. Nursing staff states she ate little of her lunch try.  She is also getting more confused, states she needs to go home and take care of her child.   13:55 Dr Karilyn Cota admit to tele, to Dr Juanetta Gosling  Results for orders placed during the hospital encounter of 07/13/13  TROPONIN I      Result Value Range   Troponin I <0.30  <0.30 ng/mL  CBC WITH DIFFERENTIAL      Result Value Range   WBC 8.0  4.0 - 10.5 K/uL  RBC 2.73 (*) 3.87 - 5.11 MIL/uL   Hemoglobin 7.9 (*) 12.0 - 15.0 g/dL   HCT 16.1 (*) 09.6 - 04.5 %   MCV 86.4  78.0 - 100.0 fL   MCH 28.9  26.0 - 34.0 pg   MCHC 33.5  30.0 - 36.0 g/dL   RDW 40.9  81.1 - 91.4 %   Platelets 171  150 - 400 K/uL   Neutrophils Relative % 73  43 - 77 %   Neutro Abs 5.9  1.7 - 7.7 K/uL   Lymphocytes Relative 17  12 - 46 %   Lymphs Abs 1.3  0.7 - 4.0 K/uL   Monocytes Relative 9  3 - 12 %   Monocytes Absolute 0.7  0.1 - 1.0 K/uL   Eosinophils Relative 2  0 - 5 %   Eosinophils Absolute 0.1  0.0 - 0.7 K/uL   Basophils Relative 0  0 - 1 %   Basophils Absolute 0.0  0.0 - 0.1 K/uL  COMPREHENSIVE METABOLIC PANEL      Result Value Range   Sodium 133 (*) 135 - 145 mEq/L   Potassium 4.7  3.5 - 5.1 mEq/L   Chloride 99  96 - 112 mEq/L   CO2 18 (*) 19 - 32 mEq/L   Glucose, Bld 108 (*) 70 - 99 mg/dL   BUN 56 (*) 6 - 23 mg/dL    Creatinine, Ser 7.82 (*) 0.50 - 1.10 mg/dL   Calcium 9.2  8.4 - 95.6 mg/dL   Total Protein 7.2  6.0 - 8.3 g/dL   Albumin 3.5  3.5 - 5.2 g/dL   AST 19  0 - 37 U/L   ALT 13  0 - 35 U/L   Alkaline Phosphatase 77  39 - 117 U/L   Total Bilirubin 0.3  0.3 - 1.2 mg/dL   GFR calc non Af Amer 9 (*) >90 mL/min   GFR calc Af Amer 11 (*) >90 mL/min  LIPASE, BLOOD      Result Value Range   Lipase 40  11 - 59 U/L  URINALYSIS, ROUTINE W REFLEX MICROSCOPIC      Result Value Range   Color, Urine YELLOW  YELLOW   APPearance CLEAR  CLEAR   Specific Gravity, Urine 1.015  1.005 - 1.030   pH 5.5  5.0 - 8.0   Glucose, UA NEGATIVE  NEGATIVE mg/dL   Hgb urine dipstick NEGATIVE  NEGATIVE   Bilirubin Urine NEGATIVE  NEGATIVE   Ketones, ur NEGATIVE  NEGATIVE mg/dL   Protein, ur NEGATIVE  NEGATIVE mg/dL   Urobilinogen, UA 0.2  0.0 - 1.0 mg/dL   Nitrite NEGATIVE  NEGATIVE   Leukocytes, UA NEGATIVE  NEGATIVE    Laboratory interpretation all normal except marked worsening of her chronic anemia, renal insufficiency with metabolic acidosis that is mild   Ct Head Wo Contrast  06/30/2013  .  IMPRESSION: No acute intracranial pathology.  Chronic ischemic changes and atrophy are noted.   Original Report Authenticated By: Jolaine Click, M.D.   Ct Head Wo Contrast  06/28/2013   *.  IMPRESSION: Atrophy with small vessel chronic ischemic changes of deep cerebral white matter. No acute intracranial abnormalities.   Original Report Authenticated By: Ulyses Southward, M.D.   Ct Abdomen Pelvis W Contrast  06/29/2013   IMPRESSION: Suggestion of mild bladder wall thickening which could represent cystitis.  No evidence of bowel obstruction.  No acute processes otherwise identified.   Original Report Authenticated By: Burman Nieves, M.D.  US Carotid Duplex Bilateral  07/01/2013     IMPRESSION: Minor carotid plaque formation.  No hemodynamically significant ICA stenosis by ultrasound   Original Report Authenticated By: Judie Petit. Shick,  M.D.   Dg Chest Port 1 View  07/01/2013    IMPRESSION: Left-sided PICC line with tip over the mid SVC, no pneumothorax.   Original Report Authenticated By: Christiana Pellant, M.D.   Dg Abd Acute W/chest  06/28/2013   *  IMPRESSION: No acute abdominal findings. Enlargement of cardiac silhouette post pacemaker and CABG.   Original Report Authenticated By: Ulyses Southward, M.D.     Date: 07/13/2013  Rate: 71  Rhythm: normal sinus rhythm and sinus arrhythmia  QRS Axis: normal  Intervals: normal  ST/T Wave abnormalities: normal  Conduction Disutrbances:none  Narrative Interpretation:   Old EKG Reviewed: unchanged from 06/30/2013    1. Weakness   2. Anemia   3. Renal insufficiency   4. Metabolic acidosis   5. Altered mental state   6. Dehydration   7. Diabetes mellitus, type II   8. Hypertension   9. Obesity   10. PUD (peptic ulcer disease)   11. Confusion     Plan admission   Devoria Albe, MD, FACEP    MDM  I personally performed the services described in this documentation, which was scribed in my presence. The recorded information has been reviewed and considered.  Devoria Albe, MD, Armando Gang    Ward Givens, MD 07/13/13 [phone]

## 2013-07-13 NOTE — ED Notes (Signed)
Ambulated pt in hallway with two person assistance and a walker. Pt was able to take a few steps and stated she feels very weak.

## 2013-07-13 NOTE — ED Notes (Signed)
Pt's daughter called requesting she speak with EDP secondary to information on said pt's condition, living will status and plan of care. Family member informed pt was admitted to [address] unit and was redirected to hospitalist for answers to her questions.

## 2013-07-13 NOTE — ED Notes (Addendum)
Pt ate less than 25 % of lunch tray provided. Pt continually states she must go home to take care of her infant child. And she must go to work. EDP aware of pt's increasing confusion.

## 2013-07-13 NOTE — H&P (Signed)
[facility] History and Physical  [name]:[id number] DOB: [birth year] DOA: 07/13/2013  Referring physician: Dr. Lynelle Doctor, ER. PCP: Fredirick Maudlin, MD  Specialists: Dr. Kendell Bane, gastroenterology.  Chief Complaint: Altered mental status.  HPI: [name] is a 72 y.o. female who is a patient of Dr. Juanetta Gosling and was recently discharged from the hospital approximately 2 weeks ago when she was previously admitted with altered mental status. The etiology of the altered mental status was not clear but she did improve. She went home and seemed to be doing reasonably well for a few days. However since the last couple of days she has been more confused. The daughter, with whom I spoke to on the telephone, tells me that she had one episode of vomiting, which looked like possible coffee grounds. The patient denies any abdominal discomfort although she is rather confused at the present time and wants to go home. He is not able to give me any further history.   Review of Systems:  Unable to obtain secondary to altered mental status.  Past Medical History  Diagnosis Date  . Diabetes mellitus, type II   . Hypertension   . Arteriosclerotic cardiovascular disease (ASCVD)     multivessel, CABG 6/10.  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  Myoview (11/20/11) showed no ischemia & EF 62%  . Gastroesophageal reflux disease     Hiatal hernia  . Hyperlipidemia   . Deep vein thrombosis, upper right extremity     post-PPM  . Obesity   . DDD (degenerative disc disease)   . Dehiscence of closure of sternum or sternotomy 07/2009    sternal infection; required flap closure  . COPD (chronic obstructive pulmonary disease)     severe lung disease by PFTs 6/12  . Sleep apnea   . Seizure disorder   . 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 Pacemaker placement  1993    Status post DDD pacemaker implantation in 1993, with  Medtronic Kappa generator change in 2003 for  second-degree AV block, most recent gen change by Fawn Kirk 04/14/11  . Revision total hip arthroplasty    . Replacement total knee      Left  . Thyroid surgery    . Wrist surgery    . 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 Reconstructive repair sternal      for infection s/p CABG 8/10  . Back surgery    . Esophagogastroduodenoscopy      with [facility] dilation,  biopsy, and disruption Schatzki's ring  . Colonoscopy  04/27/2005    hemorrhoids, diverticula  . Esophagogastroduodenoscopy  09/10/2012    RMR: Noncritical Schatzki's ring. Diffuse gastric erosions and an  area of partially healing ulceration-status post biopsy/ Small hiatal hernia. Bx reactive gastropathy.  . Abdominal hysterectomy    . Colonoscopy with esophagogastroduodenoscopy (egd) N/A 02/20/2013    RMR: pancolonic diverticulosis and internal hemorrhoids. EGD with small hiatal hernia, healed gastric ulcer   Social History:  reports that she has never smoked. She has never used smokeless tobacco. She reports that she does not drink alcohol or use illicit drugs. Lives with her daughter.   Allergies  Allergen Reactions  . Codeine     "good" headache  . Other     Leafy raw vegetables and seeds  . Oxycodone Hcl     "  good" headache; also OxyContin  . Reglan (Metoclopramide)     CONFUSION    Family History  Problem Relation Age of Onset  . Diabetes    . Heart disease Mother     MI  . Coronary artery disease      Female <55  . Cancer    . Colon cancer Father     age 77      Prior to Admission medications   Medication Sig Start Date End Date Taking? Authorizing Provider  amLODipine (NORVASC) 10 MG tablet Take 10 mg by mouth daily. 11/21/11  Yes Dayna N Dunn, PA-C  aspirin EC 81 MG tablet Take 81 mg by mouth daily.   Yes Historical Provider, MD  atorvastatin (LIPITOR) 40 MG tablet Take 40 mg by mouth daily.   Yes Historical Provider, MD   benazepril (LOTENSIN) 40 MG tablet Take 1 tablet (40 mg total) by mouth daily. 07/03/13  Yes Fredirick Maudlin, MD  Bromfenac Sodium (BROMDAY) 0.09 % SOLN Place 1 drop into the right eye 3 (three) times daily.   Yes Historical Provider, MD  doxepin (SINEQUAN) 25 MG capsule Take 25 mg by mouth at bedtime.   Yes Historical Provider, MD  estradiol (ESTRACE) 0.5 MG tablet Take 0.5 mg by mouth daily.     Yes Historical Provider, MD  fentaNYL (DURAGESIC - DOSED MCG/HR) 100 MCG/HR Place 1 patch onto the skin every 3 (three) days.   Yes Historical Provider, MD  hydrALAZINE (APRESOLINE) 25 MG tablet Take 25 mg by mouth every 8 (eight) hours. 11/21/11  Yes Dayna N Dunn, PA-C  levETIRAcetam (KEPPRA) 250 MG tablet Take 1 tablet (250 mg total) by mouth 2 (two) times daily. 07/03/13  Yes Fredirick Maudlin, MD  magnesium hydroxide (MILK OF MAGNESIA) 400 MG/5ML suspension Takes couple of tablespoons daily.   Yes Historical Provider, MD  metFORMIN (GLUCOPHAGE) 500 MG tablet Take 500 mg by mouth 2 (two) times daily with a meal.    Yes Historical Provider, MD  metoprolol tartrate (LOPRESSOR) 25 MG tablet Take 12.5 mg by mouth 2 (two) times daily.   Yes Historical Provider, MD  nitroGLYCERIN (NITROSTAT) 0.4 MG SL tablet Place 0.4 mg under the tongue every 5 (five) minutes as needed for chest pain.  11/21/11  Yes Dayna N Dunn, PA-C  pantoprazole (PROTONIX) 40 MG tablet Take 40 mg by mouth 2 (two) times daily. 02/13/13  Yes Nira Retort, NP  potassium chloride (K-DUR) 10 MEQ tablet Take 2 tablets (20 mEq total) by mouth daily. 11/09/12  Yes Scott T Weaver, PA-C  senna-docusate (SENOKOT-S) 8.6-50 MG per tablet Take 1 tablet by mouth daily as needed for constipation.    Yes Historical Provider, MD  traMADol (ULTRAM) 50 MG tablet Take 50 mg by mouth every 6 (six) hours as needed for pain.   Yes Historical Provider, MD   Physical Exam: Filed Vitals:   07/13/13 1320  BP: 140/116  Pulse: 75  Temp:   Resp: 18     General:   She looks pale. She is confused.  Eyes: Pallor. No jaundice.  ENT: Unremarkable.  Neck: No lymphadenopathy.  Cardiovascular: Heart sounds are present in sinus rhythm. There is a soft systolic murmur heard in the left sternal. There is no evidence of heart failure clinically.  Respiratory: Lung fields are clear.  Abdomen: Soft, nontender. No hepatosplenomegaly.  Skin: No rash.  Musculoskeletal: No acute joint abnormalities.  Psychiatric: Not examined.  Neurologic: Confused. Seems to be moving all her  limbs. There does not appear to be any focal neurological signs.  Labs on Admission:  Basic Metabolic Panel:  Recent Labs Lab 07/13/13 1149  NA 133*  K 4.7  CL 99  CO2 18*  GLUCOSE 108*  BUN 56*  CREATININE 4.35*  CALCIUM 9.2   Liver Function Tests:  Recent Labs Lab 07/13/13 1149  AST 19  ALT 13  ALKPHOS 77  BILITOT 0.3  PROT 7.2  ALBUMIN 3.5    Recent Labs Lab 07/13/13 1149  LIPASE 40    CBC:  Recent Labs Lab 07/13/13 1149  WBC 8.0  NEUTROABS 5.9  HGB 7.9*  HCT 23.6*  MCV 86.4  PLT 171   Cardiac Enzymes:  Recent Labs Lab 07/13/13 0913  TROPONINI <0.30        Assessment/Plan   1. Dehydration/acute renal failure. 2. Altered mental status secondary to #1. 3. Anemia, possibly indicative of occult upper GI bleed. 4. Hypertension. 5. Diabetes mellitus. 6. History of peptic ulcer disease in the past. 7. Obesity.  Plan: 1. Admit to medical floor. 2. Intravenous fluids. 3. Monitor hemoglobin closely. Transfuse as required. 4. Gastroenterology consultation. She may need EGD again. Further recommendations will depend on patient's hospital progress.   Code Status: I discussed CODE STATUS with the patient's daughter, who is the health care power of attorney. She will think about this.  Family Communication: Discussed condition with patient's daughter over the telephone.  Disposition Plan: Depending on progress.   Time spent: 45  minutes.  Wilson Singer [facility] Pager [phone]  If 7PM-7AM, please contact night-coverage [email or web address] Password [email or web address] 07/13/2013, 2:58 PM

## 2013-07-14 ENCOUNTER — Inpatient Hospital Stay (HOSPITAL_COMMUNITY): Payer: PRIVATE HEALTH INSURANCE

## 2013-07-14 ENCOUNTER — Encounter (HOSPITAL_COMMUNITY): Payer: PRIVATE HEALTH INSURANCE

## 2013-07-14 LAB — COMPREHENSIVE METABOLIC PANEL
ALT: 12 U/L (ref 0–35)
AST: 18 U/L (ref 0–37)
Alkaline Phosphatase: 75 U/L (ref 39–117)
CO2: 20 mEq/L (ref 19–32)
Calcium: 9.2 mg/dL (ref 8.4–10.5)
Chloride: 105 mEq/L (ref 96–112)
GFR calc non Af Amer: 18 mL/min — ABNORMAL LOW (ref >90)
Potassium: 4.4 mEq/L (ref 3.5–5.1)
Sodium: 138 mEq/L (ref 135–145)
Total Bilirubin: 0.3 mg/dL (ref 0.3–1.2)

## 2013-07-14 LAB — CBC
HCT: 21.8 % — ABNORMAL LOW (ref 36.0–46.0)
Hemoglobin: 8.4 g/dL — ABNORMAL LOW (ref 12.0–15.0)
MCH: 28.8 pg (ref 26.0–34.0)
MCH: 29 pg (ref 26.0–34.0)
MCHC: 33.5 g/dL (ref 30.0–36.0)
MCHC: 33.9 g/dL (ref 30.0–36.0)
MCHC: 33 g/dL (ref 30.0–36.0)
MCV: 85.5 fL (ref 78.0–100.0)
MCV: 86.2 fL (ref 78.0–100.0)
Platelets: 174 10*3/uL (ref 150–400)
RBC: 2.76 MIL/uL — ABNORMAL LOW (ref 3.87–5.11)
RDW: 14.9 % (ref 11.5–15.5)
RDW: 14.9 % (ref 11.5–15.5)

## 2013-07-14 LAB — GLUCOSE, CAPILLARY
Glucose-Capillary: 99 mg/dL (ref 70–99)
Glucose-Capillary: 82 mg/dL (ref 70–99)

## 2013-07-14 MED ORDER — FESOTERODINE FUMARATE ER 4 MG PO TB24
4.0000 mg | ORAL_TABLET | Freq: Every day | ORAL | Status: DC
  Administered 2013-07-14 – 2013-07-17 (×4): 4 mg via ORAL
  Filled 2013-07-14 (×6): qty 1

## 2013-07-14 MED ORDER — SODIUM CHLORIDE 0.9 % IJ SOLN: 10.0000 mL | INTRAMUSCULAR | Status: DC | PRN

## 2013-07-14 MED ORDER — SODIUM CHLORIDE 0.9 % IJ SOLN
10.0000 mL | Freq: Two times a day (BID) | INTRAMUSCULAR | Status: DC
  Administered 2013-07-14 – 2013-07-15 (×4): 10 mL
  Administered 2013-07-16: 40 mL

## 2013-07-14 NOTE — Progress Notes (Signed)
Ask by dr Lovell Sheehan  If i would please try and place a picc one more time that he would insure that it would not be taken out until patient came for port a cath insertion.

## 2013-07-14 NOTE — Progress Notes (Signed)
Subjective: She says she feels better. She is more alert and less confused. She has no other new complaints. She was admitted with what appears to be acute renal failure and her hemoglobin level had dropped. She has lost IV access.  Objective: Vital signs in last 24 hours: Temp:  [97.9 F (36.6 C)-98.8 F (37.1 C)] 97.9 F (36.6 C) (08/10 0549) Pulse Rate:  [71-85] 85 (08/10 0549) Resp:  [15-18] 18 (08/10 0549) BP: (115-170)/(47-116) 170/88 mmHg (08/10 0549) SpO2:  [95 %-99 %] 98 % (08/10 0549) Weight:  [88 kg (194 lb 0.1 oz)] 88 kg (194 lb 0.1 oz) (08/09 1552) Weight change:  Last BM Date: 07/12/13  Intake/Output from previous day: 08/09 0701 - 08/10 0700 In: 1420 [P.O.:120; I.V.:1300] Out: 275 [Urine:275]  PHYSICAL EXAM General appearance: alert, cooperative, mild distress and morbidly obese Resp: clear to auscultation bilaterally Cardio: regular rate and rhythm, S1, S2 normal, no murmur, click, rub or gallop GI: soft, non-tender; bowel sounds normal; no masses,  no organomegaly Extremities: extremities normal, atraumatic, no cyanosis or edema  Lab Results:    Basic Metabolic Panel:  Recent Labs  57/84/69 1149 07/14/13 0715  NA 133* 138  K 4.7 4.4  CL 99 105  CO2 18* 20  GLUCOSE 108* 135*  BUN 56* 41*  CREATININE 4.35* 2.44*  CALCIUM 9.2 9.2   Liver Function Tests:  Recent Labs  07/13/13 1149 07/14/13 0715  AST 19 18  ALT 13 12  ALKPHOS 77 75  BILITOT 0.3 0.3  PROT 7.2 7.2  ALBUMIN 3.5 3.5    Recent Labs  07/13/13 1149  LIPASE 40   No results found for this basename: AMMONIA,  in the last 72 hours CBC:  Recent Labs  07/13/13 1149  07/13/13 2258 07/14/13 0715  WBC 8.0  < > 7.8 8.6  NEUTROABS 5.9  --   --   --   HGB 7.9*  < > 7.8* 8.4*  HCT 23.6*  < > 22.7* 25.1*  MCV 86.4  < > 86.0 86.0  PLT 171  < > 167 169  < > = values in this interval not displayed. Cardiac Enzymes:  Recent Labs  07/13/13 0913  TROPONINI <0.30   BNP: No  results found for this basename: PROBNP,  in the last 72 hours D-Dimer: No results found for this basename: DDIMER,  in the last 72 hours CBG:  Recent Labs  07/13/13 1627 07/13/13 2016 07/14/13 0744  GLUCAP 123* 166* 114*   Hemoglobin A1C: No results found for this basename: HGBA1C,  in the last 72 hours Fasting Lipid Panel: No results found for this basename: CHOL, HDL, LDLCALC, TRIG, CHOLHDL, LDLDIRECT,  in the last 72 hours Thyroid Function Tests: No results found for this basename: TSH, T4TOTAL, FREET4, T3FREE, THYROIDAB,  in the last 72 hours Anemia Panel: No results found for this basename: VITAMINB12, FOLATE, FERRITIN, TIBC, IRON, RETICCTPCT,  in the last 72 hours Coagulation: No results found for this basename: LABPROT, INR,  in the last 72 hours Urine Drug Screen: Drugs of Abuse  No results found for this basename: labopia, cocainscrnur, labbenz, amphetmu, thcu, labbarb    Alcohol Level: No results found for this basename: ETH,  in the last 72 hours Urinalysis:  Recent Labs  07/13/13 1248  COLORURINE YELLOW  LABSPEC 1.015  PHURINE 5.5  GLUCOSEU NEGATIVE  HGBUR NEGATIVE  BILIRUBINUR NEGATIVE  KETONESUR NEGATIVE  PROTEINUR NEGATIVE  UROBILINOGEN 0.2  NITRITE NEGATIVE  LEUKOCYTESUR NEGATIVE   Misc. Labs:  ABGS No results found for this basename: PHART, PCO2, PO2ART, TCO2, HCO3,  in the last 72 hours CULTURES No results found for this or any previous visit (from the past 240 hour(s)). Studies/Results: No results found.  Medications:  Prior to Admission:  Prescriptions prior to admission  Medication Sig Dispense Refill  . amLODipine (NORVASC) 10 MG tablet Take 10 mg by mouth daily.      Marland Kitchen aspirin EC 81 MG tablet Take 81 mg by mouth daily.      Marland Kitchen atorvastatin (LIPITOR) 40 MG tablet Take 40 mg by mouth daily.      . benazepril (LOTENSIN) 40 MG tablet Take 1 tablet (40 mg total) by mouth daily.  30 tablet  12  . Bromfenac Sodium (BROMDAY) 0.09 % SOLN  Place 1 drop into the right eye 3 (three) times daily.      Marland Kitchen doxepin (SINEQUAN) 25 MG capsule Take 25 mg by mouth at bedtime.      Marland Kitchen estradiol (ESTRACE) 0.5 MG tablet Take 0.5 mg by mouth daily.        . fentaNYL (DURAGESIC - DOSED MCG/HR) 100 MCG/HR Place 1 patch onto the skin every 3 (three) days.      . hydrALAZINE (APRESOLINE) 25 MG tablet Take 25 mg by mouth every 8 (eight) hours.      . levETIRAcetam (KEPPRA) 250 MG tablet Take 1 tablet (250 mg total) by mouth 2 (two) times daily.  60 tablet  12  . magnesium hydroxide (MILK OF MAGNESIA) 400 MG/5ML suspension Takes couple of tablespoons daily.      . metFORMIN (GLUCOPHAGE) 500 MG tablet Take 500 mg by mouth 2 (two) times daily with a meal.       . metoprolol tartrate (LOPRESSOR) 25 MG tablet Take 12.5 mg by mouth 2 (two) times daily.      . nitroGLYCERIN (NITROSTAT) 0.4 MG SL tablet Place 0.4 mg under the tongue every 5 (five) minutes as needed for chest pain.       . pantoprazole (PROTONIX) 40 MG tablet Take 40 mg by mouth 2 (two) times daily.      . potassium chloride (K-DUR) 10 MEQ tablet Take 2 tablets (20 mEq total) by mouth daily.  60 tablet  3  . senna-docusate (SENOKOT-S) 8.6-50 MG per tablet Take 1 tablet by mouth daily as needed for constipation.       . traMADol (ULTRAM) 50 MG tablet Take 50 mg by mouth every 6 (six) hours as needed for pain.       Scheduled: . amLODipine  10 mg Oral Daily  . atorvastatin  40 mg Oral q1800  . doxepin  25 mg Oral QHS  . estradiol  0.5 mg Oral Daily  . hydrALAZINE  25 mg Oral Q8H  . insulin aspart  0-5 Units Subcutaneous QHS  . insulin aspart  0-9 Units Subcutaneous TID WC  . ketorolac  1 drop Right Eye TID  . levETIRAcetam  250 mg Oral BID  . metoprolol tartrate  12.5 mg Oral BID  . pantoprazole (PROTONIX) IV  40 mg Intravenous Q12H   Continuous: . sodium chloride 125 mL/hr at 07/13/13 2251   UYQ:[id number]  Assesment: She was admitted with acute renal failure and drop in her  hemoglobin level. This is one of multiple admissions in the last several months. She has difficulty with IV access and that has been a recurrent problem as well. She has known peptic ulcer disease. I think she's dehydrated but that's better.  She was confused on admission but back to baseline now. She complains of inability to control her urine Active Problems:   Diabetes mellitus, type II   Hypertension   Obesity   PUD (peptic ulcer disease)   Dehydration   Anemia   Altered mental state    Plan: I will ask for central venous line. I think we need to consider Port-A-Cath placement since she's been in and out of the hospital. GI consultation will be requested. Her stool was negative in the emergency department but she had a fairly marked drop in her hemoglobin level    LOS: 1 day   [name] L 07/14/2013, 9:45 AM

## 2013-07-15 LAB — BASIC METABOLIC PANEL
BUN: 24 mg/dL — ABNORMAL HIGH (ref 6–23)
Calcium: 8.9 mg/dL (ref 8.4–10.5)
Chloride: 108 mEq/L (ref 96–112)
Creatinine, Ser: 1.47 mg/dL — ABNORMAL HIGH (ref 0.50–1.10)
GFR calc Af Amer: 39 mL/min — ABNORMAL LOW (ref >90)

## 2013-07-15 LAB — CBC WITH DIFFERENTIAL/PLATELET
Basophils Absolute: 0 10*3/uL (ref 0.0–0.1)
Basophils Relative: 0 % (ref 0–1)
Eosinophils Relative: 3 % (ref 0–5)
HCT: 23.1 % — ABNORMAL LOW (ref 36.0–46.0)
MCHC: 33.8 g/dL (ref 30.0–36.0)
Monocytes Absolute: 0.6 10*3/uL (ref 0.1–1.0)
Neutro Abs: 5.7 10*3/uL (ref 1.7–7.7)
RDW: 15.1 % (ref 11.5–15.5)

## 2013-07-15 LAB — IRON AND TIBC: Saturation Ratios: 10 % — ABNORMAL LOW (ref 20–55)

## 2013-07-15 MED ORDER — DRONABINOL 2.5 MG PO CAPS
2.5000 mg | ORAL_CAPSULE | Freq: Two times a day (BID) | ORAL | Status: DC
  Administered 2013-07-15 – 2013-07-17 (×4): 2.5 mg via ORAL
  Filled 2013-07-15 (×4): qty 1

## 2013-07-15 MED ORDER — ONDANSETRON HCL 4 MG PO TABS: 4.0000 mg | ORAL_TABLET | Freq: Three times a day (TID) | ORAL | Status: DC | PRN

## 2013-07-15 NOTE — Progress Notes (Signed)
Aware of consultation for Port-A-Cath placement. Will await GI consultation and workup for anemia. Plan to place Port-A-Cath prior to discharge. Do not remove PICC line at this point.

## 2013-07-15 NOTE — Consult Note (Signed)
Referring Provider: Fredirick Maudlin, MD Primary Care Physician:  Fredirick Maudlin, MD Primary Gastroenterologist:  Roetta Sessions, MD  Reason for Consultation:  Anemia, h/o ulcer  HPI: [name] is a 72 y.o. female well-known to our practice with a history of peptic ulcer disease secondary to NSAIDs; she had a surveillance EGD in March 2014 by Dr. Jena Gauss with noted healed gastric ulcer. She also has a history of gastroparesis and has been unable to tolerate Reglan due to confusion in the past. Hospitalized back in July with altered mental status changes. We saw her during her July hospitalization for intermittent nausea and vomiting felt to be due to gastroparesis. She had a NSTEMI and delirium during last hospitalization and it was not felt that she needed an upper endoscopy at that time. It was recommended that she continue PPI twice a day, Zofran at meals, consider Marinol if no noted improvement in recurrent vomiting.  Patient readmitted August 9 with recurrent increased confusion, poor oral intake, weakness. Daughter reported single episode of vomiting with possible coffee ground emesis. Reportedly had had poor oral intake and constipation at home. She was noted to have acute renal failure on admission. Her creatinine has improved. She has difficult IV access and Port-A-Cath placement is being considered as well. She has had a drop in hemoglobin from her baseline since July. Hemoccult negative.  Her last colonoscopy was in March of 2014 and showed diverticulosis and hemorrhoids.  ?reliability of history from patient. She knows the President. She knows person, place. Doesn't know what year it is. Knows it is August. She denies abdominal pain. She wants solid food. Denies n/v. States her BMs are normal. No melena, brbpr.    Component     Latest Ref Rng 06/28/2013 06/29/2013 06/30/2013 07/01/2013 07/13/2013            11:49 AM  Hemoglobin     12.0 - 15.0 g/dL 62.9 (L) 9.6 (L) 52.8 (L) 10.2 (L) 7.9  (L)  HCT     36.0 - 46.0 % 30.2 (L) 29.4 (L) 33.2 (L) 31.0 (L) 23.6 (L)  MCV     78.0 - 100.0 fL 86.3 87.0 84.3 85.4 86.4   Component     Latest Ref Rng 07/13/2013 07/13/2013 07/14/2013 07/14/2013 07/14/2013         3:08 PM 10:58 PM  7:15 AM  3:48 PM 11:23 PM  Hemoglobin     12.0 - 15.0 g/dL 7.9 (L) 7.8 (L) 8.4 (L) 8.0 (L) 7.2 (L)  HCT     36.0 - 46.0 % 23.8 (L) 22.7 (L) 25.1 (L) 23.6 (L) 21.8 (L)  MCV     78.0 - 100.0 fL 85.9 86.0 86.0 85.5 86.2   Component     Latest Ref Rng 07/15/2013          Hemoglobin     12.0 - 15.0 g/dL 7.8 (L)  HCT     41.3 - 46.0 % 23.1 (L)  MCV     78.0 - 100.0 fL 86.8   Prior to Admission medications   Medication Sig Start Date End Date Taking? Authorizing Provider  amLODipine (NORVASC) 10 MG tablet Take 10 mg by mouth daily. 11/21/11  Yes Dayna N Dunn, PA-C  aspirin EC 81 MG tablet Take 81 mg by mouth daily.   Yes Historical Provider, MD  atorvastatin (LIPITOR) 40 MG tablet Take 40 mg by mouth daily.   Yes Historical Provider, MD  benazepril (LOTENSIN) 40 MG tablet Take 1 tablet (  40 mg total) by mouth daily. 07/03/13  Yes Fredirick Maudlin, MD  Bromfenac Sodium (BROMDAY) 0.09 % SOLN Place 1 drop into the right eye 3 (three) times daily.   Yes Historical Provider, MD  doxepin (SINEQUAN) 25 MG capsule Take 25 mg by mouth at bedtime.   Yes Historical Provider, MD  estradiol (ESTRACE) 0.5 MG tablet Take 0.5 mg by mouth daily.     Yes Historical Provider, MD  fentaNYL (DURAGESIC - DOSED MCG/HR) 100 MCG/HR Place 1 patch onto the skin every 3 (three) days.   Yes Historical Provider, MD  hydrALAZINE (APRESOLINE) 25 MG tablet Take 25 mg by mouth every 8 (eight) hours. 11/21/11  Yes Dayna N Dunn, PA-C  levETIRAcetam (KEPPRA) 250 MG tablet Take 1 tablet (250 mg total) by mouth 2 (two) times daily. 07/03/13  Yes Fredirick Maudlin, MD  magnesium hydroxide (MILK OF MAGNESIA) 400 MG/5ML suspension Takes couple of tablespoons daily.   Yes Historical Provider, MD  metFORMIN  (GLUCOPHAGE) 500 MG tablet Take 500 mg by mouth 2 (two) times daily with a meal.    Yes Historical Provider, MD  metoprolol tartrate (LOPRESSOR) 25 MG tablet Take 12.5 mg by mouth 2 (two) times daily.   Yes Historical Provider, MD  nitroGLYCERIN (NITROSTAT) 0.4 MG SL tablet Place 0.4 mg under the tongue every 5 (five) minutes as needed for chest pain.  11/21/11  Yes Dayna N Dunn, PA-C  pantoprazole (PROTONIX) 40 MG tablet Take 40 mg by mouth 2 (two) times daily. 02/13/13  Yes Nira Retort, NP  potassium chloride (K-DUR) 10 MEQ tablet Take 2 tablets (20 mEq total) by mouth daily. 11/09/12  Yes Scott T Weaver, PA-C  senna-docusate (SENOKOT-S) 8.6-50 MG per tablet Take 1 tablet by mouth daily as needed for constipation.    Yes Historical Provider, MD  traMADol (ULTRAM) 50 MG tablet Take 50 mg by mouth every 6 (six) hours as needed for pain.   Yes Historical Provider, MD    Current Facility-Administered Medications  Medication Dose Route Frequency Provider Last Rate Last Dose  . 0.9 %  sodium chloride infusion   Intravenous Continuous Wilson Singer, MD 125 mL/hr at 07/15/13 1021    . amLODipine (NORVASC) tablet 10 mg  10 mg Oral Daily Nimish C Karilyn Cota, MD   10 mg at 07/15/13 1018  . atorvastatin (LIPITOR) tablet 40 mg  40 mg Oral q1800 Nimish Normajean Glasgow, MD   40 mg at 07/14/13 1532  . doxepin (SINEQUAN) capsule 25 mg  25 mg Oral QHS Wilson Singer, MD   25 mg at 07/14/13 2122  . estradiol (ESTRACE) tablet 0.5 mg  0.5 mg Oral Daily Nimish C Gosrani, MD   0.5 mg at 07/14/13 1052  . fesoterodine (TOVIAZ) tablet 4 mg  4 mg Oral Daily Fredirick Maudlin, MD   4 mg at 07/15/13 1018  . hydrALAZINE (APRESOLINE) tablet 25 mg  25 mg Oral Q8H Nimish C Gosrani, MD   25 mg at 07/15/13 0543  . insulin aspart (novoLOG) injection 0-5 Units  0-5 Units Subcutaneous QHS Nimish C Gosrani, MD      . insulin aspart (novoLOG) injection 0-9 Units  0-9 Units Subcutaneous TID WC Nimish Normajean Glasgow, MD   1 Units at 07/13/13 1723   . ketorolac (ACULAR) 0.5 % ophthalmic solution 1 drop  1 drop Right Eye TID Wilson Singer, MD   1 drop at 07/15/13 1018  . levETIRAcetam (KEPPRA) tablet 250 mg  250 mg  Oral BID Wilson Singer, MD   250 mg at 07/15/13 1018  . metoprolol tartrate (LOPRESSOR) tablet 12.5 mg  12.5 mg Oral BID Nimish C Gosrani, MD   12.5 mg at 07/15/13 1018  . nitroGLYCERIN (NITROSTAT) SL tablet 0.4 mg  0.4 mg Sublingual Q5 min PRN Nimish C Gosrani, MD      . pantoprazole (PROTONIX) injection 40 mg  40 mg Intravenous Q12H Nimish C Gosrani, MD   40 mg at 07/15/13 1018  . sodium chloride 0.9 % injection 10-40 mL  10-40 mL Intracatheter Q12H Fredirick Maudlin, MD   10 mL at 07/15/13 1022  . sodium chloride 0.9 % injection 10-40 mL  10-40 mL Intracatheter PRN Fredirick Maudlin, MD        Allergies as of 07/13/2013 - Review Complete 07/13/2013  Allergen Reaction Noted  . Codeine  05/13/2009  . Other  03/23/2012  . Oxycodone hcl    . Reglan (metoclopramide)  03/04/2013    Past Medical History  Diagnosis Date  . Diabetes mellitus, type II   . Hypertension   . Arteriosclerotic cardiovascular disease (ASCVD)     multivessel, CABG 6/10.  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  Myoview (11/20/11) showed no ischemia & EF 62%  . Gastroesophageal reflux disease     Hiatal hernia  . Hyperlipidemia   . Deep vein thrombosis, upper right extremity     post-PPM  . Obesity   . DDD (degenerative disc disease)   . Dehiscence of closure of sternum or sternotomy 07/2009    sternal infection; required flap closure  . COPD (chronic obstructive pulmonary disease)     severe lung disease by PFTs 6/12  . Sleep apnea   . Seizure disorder   . 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 Pacemaker placement  1993    Status post DDD pacemaker implantation in 1993, with Medtronic Kappa generator change in 2003 for  second-degree AV block, most recent  gen change by Fawn Kirk 04/14/11  . Revision total hip arthroplasty    . Replacement total knee      Left  . Thyroid surgery    . Wrist surgery    . 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 Reconstructive repair sternal      for infection s/p CABG 8/10  . Back surgery    . Esophagogastroduodenoscopy      with [facility] dilation,  biopsy, and disruption Schatzki's ring  . Colonoscopy  04/27/2005    hemorrhoids, diverticula  . Esophagogastroduodenoscopy  09/10/2012    RMR: Noncritical Schatzki's ring. Diffuse gastric erosions and an  area of partially healing ulceration-status post biopsy/ Small hiatal hernia. Bx reactive gastropathy.  . Abdominal hysterectomy    . Colonoscopy with esophagogastroduodenoscopy (egd) N/A 02/20/2013    RMR: pancolonic diverticulosis and internal hemorrhoids. EGD with small hiatal hernia, healed gastric ulcer    Family History  Problem Relation Age of Onset  . Diabetes    . Heart disease Mother     MI  . Coronary artery disease      Female <55  . Cancer    . Colon cancer Father     age 74    History   Social History  . Marital Status: Married    Spouse Name: N/A    Number  of Children: N/A  . Years of Education: N/A   Occupational History  . Not on file.   Social History Main Topics  . Smoking status: Never Smoker   . Smokeless tobacco: Never Used  . Alcohol Use: No  . Drug Use: No  . Sexually Active: Not Currently    Birth Control/ Protection: Post-menopausal, Surgical   Other Topics Concern  . Not on file   Social History Narrative  . No narrative on file     ROS:  General: Negative for anorexia, weight loss, fever, chills, fatigue, weakness. Eyes: Negative for vision changes.  ENT: Negative for hoarseness, difficulty swallowing , nasal congestion. CV: Negative for chest pain, angina, palpitations, dyspnea on exertion, peripheral edema.  Respiratory: Negative for dyspnea at rest, dyspnea on  exertion, cough, sputum, wheezing.  GI: See history of present illness. GU:  Negative for dysuria, hematuria, urinary incontinence, urinary frequency, nocturnal urination.  MS: Negative for joint pain, low back pain.  Derm: Negative for rash or itching.  Neuro: Negative for weakness, abnormal sensation, seizure, frequent headaches, memory loss, confusion.  Psych: Negative for anxiety, depression, suicidal ideation, hallucinations.  Endo: Negative for unusual weight change.  Heme: Negative for bruising or bleeding. Allergy: Negative for rash or hives.       Physical Examination: Vital signs in last 24 hours: Temp:  [97.4 F (36.3 C)-98.6 F (37 C)] 97.4 F (36.3 C) (08/11 0546) Pulse Rate:  [61-81] 62 (08/11 0546) Resp:  [20] 20 (08/11 0546) BP: (107-139)/(62-85) 139/85 mmHg (08/11 0546) SpO2:  [96 %-98 %] 98 % (08/11 0546) Last BM Date: 07/13/13  General: Well-nourished, well-developed in no acute distress.  Eating clear liquid lunch. Head: Normocephalic, atraumatic.   Eyes: Conjunctiva pink, no icterus. Mouth: Oropharyngeal mucosa moist and pink , no lesions erythema or exudate. Neck: Supple without thyromegaly, masses, or lymphadenopathy.  Lungs: Clear to auscultation bilaterally.  Heart: Regular rate and rhythm, no murmurs rubs or gallops.  Abdomen: Bowel sounds are normal, nontender, nondistended, no hepatosplenomegaly or masses, no abdominal bruits or    hernia , no rebound or guarding.   Rectal: Not performed Extremities: No lower extremity edema, clubbing, deformity.  Neuro: Alert and oriented x 4 , grossly normal neurologically.  Skin: Warm and dry, no rash or jaundice.   Psych: Alert and cooperative, normal mood and affect.        Intake/Output from previous day: 08/10 0701 - 08/11 0700 In: 1320 [P.O.:1320] Out: -  Intake/Output this shift: Total I/O In: 240 [P.O.:240] Out: -   Lab Results: CBC  Recent Labs  07/14/13 1548 07/14/13 2323 07/15/13 0547   WBC 8.3 8.9 7.9  HGB 8.0* 7.2* 7.8*  HCT 23.6* 21.8* 23.1*  MCV 85.5 86.2 86.8  PLT 174 161 165   BMET  Recent Labs  07/13/13 1149 07/14/13 0715 07/15/13 0547  NA 133* 138 139  K 4.7 4.4 3.9  CL 99 105 108  CO2 18* 20 19  GLUCOSE 108* 135* 131*  BUN 56* 41* 24*  CREATININE 4.35* 2.44* 1.47*  CALCIUM 9.2 9.2 8.9   LFT  Recent Labs  07/13/13 1149 07/14/13 0715  BILITOT 0.3 0.3  ALKPHOS 77 75  AST 19 18  ALT 13 12  PROT 7.2 7.2  ALBUMIN 3.5 3.5   Lab Results  Component Value Date   LIPASE 40 07/13/2013   Lab Results  Component Value Date   VITAMINB12 590 07/01/2013     PT/INR No results found for this  basename: LABPROT, INR,  in the last 72 hours    Imaging Studies: Ct Abdomen Pelvis W Contrast  06/29/2013   *RADIOLOGY REPORT*  Clinical Data: Abdominal pain.  Confusion.  Decreased appetite and epigastric pain.  Constipation.  CT ABDOMEN AND PELVIS WITH CONTRAST  Technique:  Multidetector CT imaging of the abdomen and pelvis was performed following the standard protocol during bolus administration of intravenous contrast.  Contrast: 50mL OMNIPAQUE IOHEXOL 300 MG/ML  SOLN, OMNIPAQUE IOHEXOL 300 MG/ML  SOLN  Comparison: 09/16/2012  Findings: Atelectasis or fibrosis in the lung bases.  Cardiac enlargement.  Surgical absence of the gallbladder.  Small esophageal hiatal hernia.  Gastric wall is not thickened.  Small bowel are not abnormally distended.  No bowel wall thickening.  Stool filled colon without distension or wall thickening.  The liver, spleen, pancreas, adrenal glands, kidneys, inferior vena cava, and retroperitoneal lymph nodes are unremarkable. Calcification of abdominal aorta without aneurysm.  No free fluid or free air in the abdomen.  Prominent visceral adipose tissues. Umbilical/periumbilical hernia containing fat.  Scattered surgical clips in the abdominal wall.  Pelvis:  Surgical clips in the right groin/inguinal region. Visualization of portions of  the pelvis is limited due to streak artifact from right hip prosthesis.  The bladder wall appears mildly thickened.  This could be due to cystitis.  The uterus appears to be surgically absent.  No abnormal adnexal masses. Stool filled rectosigmoid colon.  No evidence of diverticulitis. The appendix is not identified.  No free or loculated pelvic fluid collections.  No significant pelvic lymphadenopathy.  Postoperative and degenerative changes in the lumbar spine.  There is anterior subluxation of L4 on L5 but this appears stable since previous study.  No destructive bone lesions appreciated.  Degenerative disc disease at L2-3.  IMPRESSION: Suggestion of mild bladder wall thickening which could represent cystitis.  No evidence of bowel obstruction.  No acute processes otherwise identified.   Original Report Authenticated By: Burman Nieves, M.D.  Dg Chest Port 1 View  07/14/2013   *RADIOLOGY REPORT*  Clinical Data: PICC line  PORTABLE CHEST - 1 VIEW  Comparison: 06/28/2013  Findings: Left arm PICC tip in the SVC in good position.  Cardiac enlargement with mild vascular congestion.  Negative for edema.  IMPRESSION: PICC tip in the mid SVC.  Mild vascular congestion without edema   Original Report Authenticated By: Janeece Riggers, M.D.    Impression: 72 year old lady with history of gastric ulcer related to NSAID use in the past, verified healing on EGD in March of 2014, gastroparesis who presents with recurrent mental status change, reported single episode of vomiting with questionable coffee-ground emesis, acute renal failure. Drop in hemoglobin from her baseline range. No report of melena or rectal bleeding. Colonoscopy up-to-date as well, March 2014, diverticulosis and hemorrhoids. No report of ongoing NSAID use. Today the patient looks well, tolerating diet and requesting for solid food. Doubt significant upper GI bleed. Hemoccult negative on rectal exam in the emergency department.  Plan: 1. Iron and TIBC,  ferritin, folate 2. Advance diet 3. Hemoccult stool x3 4. Marinol 2.5 mg twice a day for gastroparesis 5. Zofran when necessary breakthrough symptoms 6. Right now, hold off upper endoscopy. If she displays signs of overt GI bleeding, further drop in hemoglobin, may consider. If she is heme positive she may need to have a small bowel capsule endoscopy. 7. Reevaluate in the morning.   LOS: 2 days   Tana Coast  07/15/2013, 11:10 AM  Attending  note:  Patient seen and examined. As outlined above. Drop in hemoglobin noted, however, presentation not impressive for significant GI bleed. Will reassess in a.m.

## 2013-07-15 NOTE — Progress Notes (Signed)
UR chart review completed.  

## 2013-07-15 NOTE — Progress Notes (Signed)
Subjective: She says she feels fairly well. She has no new complaints. PICC line was placed yesterday. I think she's going to need Port-A-Cath placement because she's been in and out of the hospital and has very poor venous access.  Objective: Vital signs in last 24 hours: Temp:  [97.4 F (36.3 C)-98.6 F (37 C)] 97.4 F (36.3 C) (08/11 0546) Pulse Rate:  [61-81] 62 (08/11 0546) Resp:  [20] 20 (08/11 0546) BP: (107-139)/(62-85) 139/85 mmHg (08/11 0546) SpO2:  [96 %-98 %] 98 % (08/11 0546) Weight change:  Last BM Date: 07/13/13  Intake/Output from previous day: 08/10 0701 - 08/11 0700 In: 1320 [P.O.:1320] Out: -   PHYSICAL EXAM General appearance: alert, cooperative, no distress and morbidly obese Resp: clear to auscultation bilaterally Cardio: regular rate and rhythm, S1, S2 normal, no murmur, click, rub or gallop GI: Mildly tender Extremities: extremities normal, atraumatic, no cyanosis or edema  Lab Results:    Basic Metabolic Panel:  Recent Labs  09/81/19 0715 07/15/13 0547  NA 138 139  K 4.4 3.9  CL 105 108  CO2 20 19  GLUCOSE 135* 131*  BUN 41* 24*  CREATININE 2.44* 1.47*  CALCIUM 9.2 8.9   Liver Function Tests:  Recent Labs  07/13/13 1149 07/14/13 0715  AST 19 18  ALT 13 12  ALKPHOS 77 75  BILITOT 0.3 0.3  PROT 7.2 7.2  ALBUMIN 3.5 3.5    Recent Labs  07/13/13 1149  LIPASE 40   No results found for this basename: AMMONIA,  in the last 72 hours CBC:  Recent Labs  07/13/13 1149  07/14/13 2323 07/15/13 0547  WBC 8.0  < > 8.9 7.9  NEUTROABS 5.9  --   --  5.7  HGB 7.9*  < > 7.2* 7.8*  HCT 23.6*  < > 21.8* 23.1*  MCV 86.4  < > 86.2 86.8  PLT 171  < > 161 165  < > = values in this interval not displayed. Cardiac Enzymes:  Recent Labs  07/13/13 0913  TROPONINI <0.30   BNP: No results found for this basename: PROBNP,  in the last 72 hours D-Dimer: No results found for this basename: DDIMER,  in the last 72 hours CBG:  Recent  Labs  07/13/13 1627 07/13/13 2016 07/14/13 0744 07/14/13 1141 07/14/13 1608  GLUCAP 123* 166* 114* 99 82   Hemoglobin A1C: No results found for this basename: HGBA1C,  in the last 72 hours Fasting Lipid Panel: No results found for this basename: CHOL, HDL, LDLCALC, TRIG, CHOLHDL, LDLDIRECT,  in the last 72 hours Thyroid Function Tests: No results found for this basename: TSH, T4TOTAL, FREET4, T3FREE, THYROIDAB,  in the last 72 hours Anemia Panel: No results found for this basename: VITAMINB12, FOLATE, FERRITIN, TIBC, IRON, RETICCTPCT,  in the last 72 hours Coagulation: No results found for this basename: LABPROT, INR,  in the last 72 hours Urine Drug Screen: Drugs of Abuse  No results found for this basename: labopia, cocainscrnur, labbenz, amphetmu, thcu, labbarb    Alcohol Level: No results found for this basename: ETH,  in the last 72 hours Urinalysis:  Recent Labs  07/13/13 1248  COLORURINE YELLOW  LABSPEC 1.015  PHURINE 5.5  GLUCOSEU NEGATIVE  HGBUR NEGATIVE  BILIRUBINUR NEGATIVE  KETONESUR NEGATIVE  PROTEINUR NEGATIVE  UROBILINOGEN 0.2  NITRITE NEGATIVE  LEUKOCYTESUR NEGATIVE   Misc. Labs:  ABGS No results found for this basename: PHART, PCO2, PO2ART, TCO2, HCO3,  in the last 72 hours CULTURES No results  found for this or any previous visit (from the past 240 hour(s)). Studies/Results: Dg Chest Port 1 View  07/14/2013   *RADIOLOGY REPORT*  Clinical Data: PICC line  PORTABLE CHEST - 1 VIEW  Comparison: 06/28/2013  Findings: Left arm PICC tip in the SVC in good position.  Cardiac enlargement with mild vascular congestion.  Negative for edema.  IMPRESSION: PICC tip in the mid SVC.  Mild vascular congestion without edema   Original Report Authenticated By: Janeece Riggers, M.D.    Medications:  Prior to Admission:  Prescriptions prior to admission  Medication Sig Dispense Refill  . amLODipine (NORVASC) 10 MG tablet Take 10 mg by mouth daily.      Marland Kitchen aspirin EC  81 MG tablet Take 81 mg by mouth daily.      Marland Kitchen atorvastatin (LIPITOR) 40 MG tablet Take 40 mg by mouth daily.      . benazepril (LOTENSIN) 40 MG tablet Take 1 tablet (40 mg total) by mouth daily.  30 tablet  12  . Bromfenac Sodium (BROMDAY) 0.09 % SOLN Place 1 drop into the right eye 3 (three) times daily.      Marland Kitchen doxepin (SINEQUAN) 25 MG capsule Take 25 mg by mouth at bedtime.      Marland Kitchen estradiol (ESTRACE) 0.5 MG tablet Take 0.5 mg by mouth daily.        . fentaNYL (DURAGESIC - DOSED MCG/HR) 100 MCG/HR Place 1 patch onto the skin every 3 (three) days.      . hydrALAZINE (APRESOLINE) 25 MG tablet Take 25 mg by mouth every 8 (eight) hours.      . levETIRAcetam (KEPPRA) 250 MG tablet Take 1 tablet (250 mg total) by mouth 2 (two) times daily.  60 tablet  12  . magnesium hydroxide (MILK OF MAGNESIA) 400 MG/5ML suspension Takes couple of tablespoons daily.      . metFORMIN (GLUCOPHAGE) 500 MG tablet Take 500 mg by mouth 2 (two) times daily with a meal.       . metoprolol tartrate (LOPRESSOR) 25 MG tablet Take 12.5 mg by mouth 2 (two) times daily.      . nitroGLYCERIN (NITROSTAT) 0.4 MG SL tablet Place 0.4 mg under the tongue every 5 (five) minutes as needed for chest pain.       . pantoprazole (PROTONIX) 40 MG tablet Take 40 mg by mouth 2 (two) times daily.      . potassium chloride (K-DUR) 10 MEQ tablet Take 2 tablets (20 mEq total) by mouth daily.  60 tablet  3  . senna-docusate (SENOKOT-S) 8.6-50 MG per tablet Take 1 tablet by mouth daily as needed for constipation.       . traMADol (ULTRAM) 50 MG tablet Take 50 mg by mouth every 6 (six) hours as needed for pain.       Scheduled: . amLODipine  10 mg Oral Daily  . atorvastatin  40 mg Oral q1800  . doxepin  25 mg Oral QHS  . estradiol  0.5 mg Oral Daily  . fesoterodine  4 mg Oral Daily  . hydrALAZINE  25 mg Oral Q8H  . insulin aspart  0-5 Units Subcutaneous QHS  . insulin aspart  0-9 Units Subcutaneous TID WC  . ketorolac  1 drop Right Eye TID  .  levETIRAcetam  250 mg Oral BID  . metoprolol tartrate  12.5 mg Oral BID  . pantoprazole (PROTONIX) IV  40 mg Intravenous Q12H  . sodium chloride  10-40 mL Intracatheter Q12H  Continuous: . sodium chloride 125 mL/hr at 07/14/13 1829   PIR:[id number], sodium chloride  Assesment: She came to the hospital with anemia with significant change in her hemoglobin level acute renal failure which has improved. Her anemia is somewhat better. She also had altered mental status. She has a history of peptic ulcer disease. She has very poor venous access. Active Problems:   Diabetes mellitus, type II   Hypertension   Obesity   PUD (peptic ulcer disease)   Dehydration   Anemia   Altered mental state    Plan: She will have GI consultation. She will continue other treatments. Port-A-Cath placement in the next day or so. PT consultation    LOS: 2 days   Goran Olden L 07/15/2013, 8:30 AM

## 2013-07-15 NOTE — Care Management Note (Signed)
    Page 1 of 2   07/18/2013     9:22:10 AM   CARE MANAGEMENT NOTE 07/18/2013  Patient:  [name],[name]   Account Number:  [id number]  Date Initiated:  07/15/2013  Documentation initiated by:  Sharrie Rothman  Subjective/Objective Assessment:   Pt admitted from home with acute renal failure. Pt lives with her husband and daughter and will return home at discharge. Pt has cane, and walker for home use and is active with [facility].     Action/Plan:   Pt would like BSC at discharge. Pt would like to continue [facility] with [facility]. Will continue to follow.   Anticipated DC Date:  07/17/2013   Anticipated DC Plan:  HOME W HOME HEALTH SERVICES      DC Planning Services  CM consult      [facility] Choice  Resumption Of Svcs/PTA Provider  DURABLE MEDICAL EQUIPMENT   Choice offered to / List presented to:  [name]   DME arranged  BEDSIDE COMMODE      DME agency  [facility]     HH arranged  HH-1 RN  HH-2 PT      [facility] agency  [facility]   Status of service:  Completed, signed off Medicare Important Message given?  YES (If response is "NO", the following Medicare IM given date fields will be blank) Date Medicare IM given:  07/18/2013 Date Additional Medicare IM given:    Discharge Disposition:  HOME W HOME HEALTH SERVICES  Per UR Regulation:    If discussed at Long Length of Stay Meetings, dates discussed:    Comments:  07/18/13 0910 Arlyss Queen, RN BSN CM pt discharged home today with [facility] RN and PT. Alroy Bailiff of [facility] aware of pt and will collect the pts information from the chart. Kara Mead of [facility] is aware of [facility] referral and will have it dropped shipped to pts home. HH services to start within 48 hours. Pt and pts nurse aware of discharge arrangements.  07/15/13 1145 Arlyss Queen, RN BSN CM

## 2013-07-15 NOTE — Progress Notes (Signed)
We'll place Port-A-Cath tomorrow. Risks and benefits of the procedure were fully explained to the patient, who gave informed consent. The patient's family will also be contacted for consent.

## 2013-07-15 NOTE — Evaluation (Signed)
Physical Therapy Evaluation Patient Details Name: [name] MRN: [id number] DOB: 01-03-1941 Today's Date: 07/15/2013 Time: 3474-2595 PT Time Calculation (min): 27 min  PT Assessment / Plan / Recommendation History of Present Illness  Pt is admitted with increasing confusion, vomiting and anemia.  She was recently here with altered mental status.  PT evaluation at that time found her to have no PT needs.  Clinical Impression     Pt is again seen for evaluation.  She demonstrates generalized deconditioning and today needs a walker for functional gait.  I would recommend HHPT at d/c.  PT Assessment  Patient needs continued PT services    Follow Up Recommendations  Home health PT    Does the patient have the potential to tolerate intense rehabilitation    no  Barriers to Discharge        Equipment Recommendations  None recommended by PT    Recommendations for Other Services     Frequency Min 3X/week    Precautions / Restrictions Precautions Precautions: Fall Restrictions Weight Bearing Restrictions: No   Pertinent Vitals/Pain       Mobility  Bed Mobility Bed Mobility: Not assessed Transfers Transfers: Sit to Stand;Stand to Sit Sit to Stand: 4: Min guard;With upper extremity assist;From chair/3-in-1 Stand to Sit: 4: Min guard;To chair/3-in-1 Ambulation/Gait Ambulation/Gait Assistance: 4: Min assist Ambulation Distance (Feet): 40 Feet Assistive device: Rolling walker Ambulation/Gait Assistance Details: gait with quad cane is very slow and labored with antalgic LLE...walker improves gait rhythm...poor endurance Gait Pattern: Antalgic;Trunk flexed Gait velocity: WNL with walker Stairs: No Wheelchair Mobility Wheelchair Mobility: No    Exercises     PT Diagnosis: Generalized weakness  PT Problem List: Decreased strength;Decreased activity tolerance;Decreased mobility PT Treatment Interventions: Gait training;Therapeutic exercise;Patient/family education      PT Goals(Current goals can be found in the care plan section) Acute Rehab PT Goals Patient Stated Goal: none stated PT Goal Formulation: With patient Time For Goal Achievement: 07/29/13 Potential to Achieve Goals: Good  Visit Information  Last PT Received On: 07/15/13 History of Present Illness: Pt is admitted with increasing confusion, vomiting and anemia.  She was recently here with altered mental status.  PT evaluation at that time found her to have no PT needs.       Prior Functioning       Cognition  Cognition Arousal/Alertness: Awake/alert Behavior During Therapy: WFL for tasks assessed/performed Overall Cognitive Status: Within Functional Limits for tasks assessed    Extremity/Trunk Assessment Lower Extremity Assessment Lower Extremity Assessment: Generalized weakness Cervical / Trunk Assessment Cervical / Trunk Assessment: Kyphotic   Balance Balance Balance Assessed: No  End of Session PT - End of Session Equipment Utilized During Treatment: Gait belt Activity Tolerance: Patient limited by fatigue Patient left: in chair;with call bell/phone within reach;with chair alarm set Nurse Communication: Mobility status  GP     Konrad Penta 07/15/2013, 12:17 PM

## 2013-07-16 ENCOUNTER — Encounter: Payer: PRIVATE HEALTH INSURANCE | Admitting: Internal Medicine

## 2013-07-16 ENCOUNTER — Encounter (HOSPITAL_COMMUNITY)
Admission: EM | Disposition: A | Discharge status: Home Health | Payer: Self-pay | Source: Home / Self Care | Attending: Pulmonary Disease

## 2013-07-16 DIAGNOSIS — D649 Anemia, unspecified: Secondary | ICD-10-CM

## 2013-07-16 LAB — ABO/RH: ABO/RH(D): O NEG

## 2013-07-16 LAB — CBC
Hemoglobin: 7.3 g/dL — ABNORMAL LOW (ref 12.0–15.0)
MCH: 28.4 pg (ref 26.0–34.0)
MCV: 87.2 fL (ref 78.0–100.0)
RBC: 2.57 MIL/uL — ABNORMAL LOW (ref 3.87–5.11)

## 2013-07-16 LAB — GLUCOSE, CAPILLARY
Glucose-Capillary: 109 mg/dL — ABNORMAL HIGH (ref 70–99)
Glucose-Capillary: 132 mg/dL — ABNORMAL HIGH (ref 70–99)
Glucose-Capillary: 92 mg/dL (ref 70–99)
Glucose-Capillary: 119 mg/dL — ABNORMAL HIGH (ref 70–99)

## 2013-07-16 LAB — MRSA PCR SCREENING: MRSA by PCR: NEGATIVE

## 2013-07-16 LAB — FERRITIN: Ferritin: 124 ng/mL (ref 10–291)

## 2013-07-16 SURGERY — INSERTION, TUNNELED CENTRAL VENOUS DEVICE, WITH PORT
Anesthesia: Monitor Anesthesia Care

## 2013-07-16 MED ORDER — CHLORHEXIDINE GLUCONATE 4 % EX LIQD
1.0000 "application " | Freq: Once | CUTANEOUS | Status: AC
  Administered 2013-07-16: 1 via TOPICAL
  Filled 2013-07-16: qty 15

## 2013-07-16 MED ORDER — SODIUM CHLORIDE 0.9 % IV SOLN: INTRAVENOUS | Status: DC

## 2013-07-16 MED ORDER — SODIUM CHLORIDE 0.9 % IV SOLN
INTRAVENOUS | Status: DC
  Administered 2013-07-17 (×2): via INTRAVENOUS

## 2013-07-16 MED ORDER — DEXTROSE-NACL 5-0.45 % IV SOLN: INTRAVENOUS | Status: DC

## 2013-07-16 MED ORDER — PANTOPRAZOLE SODIUM 40 MG PO TBEC
40.0000 mg | DELAYED_RELEASE_TABLET | Freq: Two times a day (BID) | ORAL | Status: DC
  Administered 2013-07-16 – 2013-07-18 (×3): 40 mg via ORAL
  Filled 2013-07-16 (×3): qty 1

## 2013-07-16 MED ORDER — CEFAZOLIN SODIUM-DEXTROSE 2-3 GM-% IV SOLR
2.0000 g | INTRAVENOUS | Status: AC
  Administered 2013-07-17: 2 g via INTRAVENOUS
  Filled 2013-07-16: qty 50

## 2013-07-16 MED ORDER — TRAMADOL HCL 50 MG PO TABS
50.0000 mg | ORAL_TABLET | Freq: Four times a day (QID) | ORAL | Status: DC | PRN
  Administered 2013-07-16: 50 mg via ORAL
  Filled 2013-07-16: qty 1

## 2013-07-16 MED ORDER — DEXTROSE-NACL 5-0.45 % IV SOLN
INTRAVENOUS | Status: AC
  Administered 2013-07-16: 23:00:00 via INTRAVENOUS

## 2013-07-16 MED ORDER — CHLORHEXIDINE GLUCONATE 4 % EX LIQD
1.0000 "application " | Freq: Once | CUTANEOUS | Status: DC
  Filled 2013-07-16: qty 15

## 2013-07-16 MED ORDER — CEFAZOLIN SODIUM-DEXTROSE 2-3 GM-% IV SOLR: 2.0000 g | INTRAVENOUS | Status: DC

## 2013-07-16 NOTE — Progress Notes (Signed)
Second floor closed for construction in am. Pt transferred to overflow bed in icu. i will continue as her nurse until 1900. Pt tolerated transfer well. Blood transfusion is completed.

## 2013-07-16 NOTE — Progress Notes (Signed)
PT WAS NOT MADE NPO. ATE BREAKFAST. WILL HAVE PORTACATH INSERTED TOMORROW 07/17/13.

## 2013-07-16 NOTE — Progress Notes (Signed)
1 unit prbc startee as ordered

## 2013-07-16 NOTE — Progress Notes (Signed)
PT Cancellation Note  Patient Details Name: PETRITA BLUNCK MRN: [id number] DOB: [birth year]   Cancelled Treatment:    Reason Eval/Treat Not Completed: Medical issues which prohibited therapy Pt transferred to ICU...will hold PT today and then monitor/  Konrad Penta 07/16/2013, 4:14 PM

## 2013-07-16 NOTE — Progress Notes (Signed)
Subjective:  Spoke with patient's daughter. Patient had couple of days of diminished oral intake prior to admission. Single episode of vomiting after taking a bite of hotdog. No hematemesis or melena. Denies NSAIDs. Patient tolerating diet. Denies n/v. Complains of some epigastric soreness.  Objective: Vital signs in last 24 hours: Temp:  [97.2 F (36.2 C)-99.1 F (37.3 C)] 98.5 F (36.9 C) (08/12 0513) Pulse Rate:  [68-74] 68 (08/12 0513) Resp:  [18-20] 18 (08/12 0513) BP: (127-144)/(68-75) 144/75 mmHg (08/12 0513) SpO2:  [97 %-98 %] 98 % (08/12 0513) Last BM Date: 07/13/13 General:   Alert,  Well-developed, well-nourished, pleasant and cooperative in NAD Head:  Normocephalic and atraumatic. Eyes:  Sclera clear, no icterus.   Abdomen:  Soft, mild tenderness and nondistended. Normal bowel sounds, without guarding, and without rebound.  Extremities:  Without clubbing, deformity or edema. Neurologic:  Alert and  oriented x4;  grossly normal neurologically. Skin:  Intact without significant lesions or rashes. Psych:  Alert and cooperative. Normal mood and affect.  Intake/Output from previous day: 08/11 0701 - 08/12 0700 In: 6482.1 [P.O.:720; I.V.:5762.1] Out: 350 [Urine:350] Intake/Output this shift: Total I/O In: 355 [P.O.:230; I.V.:125] Out: -   Lab Results: CBC  Recent Labs  07/14/13 2323 07/15/13 0547 07/16/13 0445  WBC 8.9 7.9 8.2  HGB 7.2* 7.8* 7.3*  HCT 21.8* 23.1* 22.4*  MCV 86.2 86.8 87.2  PLT 161 165 156   Lab Results  Component Value Date   IRON 24* 07/15/2013   TIBC 244* 07/15/2013   FERRITIN 44 03/23/2012     BMET  Recent Labs  07/13/13 1149 07/14/13 0715 07/15/13 0547  NA 133* 138 139  K 4.7 4.4 3.9  CL 99 105 108  CO2 18* 20 19  GLUCOSE 108* 135* 131*  BUN 56* 41* 24*  CREATININE 4.35* 2.44* 1.47*  CALCIUM 9.2 9.2 8.9   LFTs  Recent Labs  07/13/13 1149 07/14/13 0715  BILITOT 0.3 0.3  ALKPHOS 77 75  AST 19 18  ALT 13 12  PROT 7.2  7.2  ALBUMIN 3.5 3.5    Recent Labs  07/13/13 1149  LIPASE 40   PT/INR No results found for this basename: LABPROT, INR,  in the last 72 hours    Imaging Studies:  Ct Abdomen Pelvis W Contrast  06/29/2013   *RADIOLOGY REPORT*  Clinical Data: Abdominal pain.  Confusion.  Decreased appetite and epigastric pain.  Constipation.  CT ABDOMEN AND PELVIS WITH CONTRAST  Technique:  Multidetector CT imaging of the abdomen and pelvis was performed following the standard protocol during bolus administration of intravenous contrast.  Contrast: 50mL OMNIPAQUE IOHEXOL 300 MG/ML  SOLN, OMNIPAQUE IOHEXOL 300 MG/ML  SOLN  Comparison: 09/16/2012  Findings: Atelectasis or fibrosis in the lung bases.  Cardiac enlargement.  Surgical absence of the gallbladder.  Small esophageal hiatal hernia.  Gastric wall is not thickened.  Small bowel are not abnormally distended.  No bowel wall thickening.  Stool filled colon without distension or wall thickening.  The liver, spleen, pancreas, adrenal glands, kidneys, inferior vena cava, and retroperitoneal lymph nodes are unremarkable. Calcification of abdominal aorta without aneurysm.  No free fluid or free air in the abdomen.  Prominent visceral adipose tissues. Umbilical/periumbilical hernia containing fat.  Scattered surgical clips in the abdominal wall.  Pelvis:  Surgical clips in the right groin/inguinal region. Visualization of portions of the pelvis is limited due to streak artifact from right hip prosthesis.  The bladder wall appears mildly thickened.  This could be due to cystitis.  The uterus appears to be surgically absent.  No abnormal adnexal masses. Stool filled rectosigmoid colon.  No evidence of diverticulitis. The appendix is not identified.  No free or loculated pelvic fluid collections.  No significant pelvic lymphadenopathy.  Postoperative and degenerative changes in the lumbar spine.  There is anterior subluxation of L4 on L5 but this appears stable since  previous study.  No destructive bone lesions appreciated.  Degenerative disc disease at L2-3.  IMPRESSION: Suggestion of mild bladder wall thickening which could represent cystitis.  No evidence of bowel obstruction.  No acute processes otherwise identified.   Original Report Authenticated By: Burman Nieves, M.D.      Assessment: 72 year old lady with history of gastric ulcer related to NSAID use in the past, verified healing on EGD in March of 2014, gastroparesis who presents with recurrent mental status change. Per daughter, no evidence of hematemesis or melena/rectal bleeding. Drop in hemoglobin from her baseline range. Colonoscopy up-to-date as well, March 2014, diverticulosis and hemorrhoids. No report of ongoing NSAID use. Doubt significant upper GI bleed. Hemoccult negative on rectal exam in the emergency department.   Plan: 1. Discussed with Arline Asp, RN. Need stool heme X 3. 2. Agree with one unit of prbcs. 3. F/u pending labs.    LOS: 3 days   Tana Coast  07/16/2013, 8:41 AM

## 2013-07-16 NOTE — Progress Notes (Signed)
Surgery was canceled today do to patient eating. Has been rescheduled for tomorrow to be performed by Dr. Leticia Penna.

## 2013-07-16 NOTE — Progress Notes (Signed)
REVIEWED.  

## 2013-07-16 NOTE — Progress Notes (Signed)
Subjective: She feels okay. She has no new complaints. I discussed her situation with her daughter at bedside and we are trying to figure out why she has recurrent episodes of confusion. I think it's probably some combination of medications at home. Her daughter is going to bring a list of everything including medications that are still in the home that she is supposed to be off of  Objective: Vital signs in last 24 hours: Temp:  [97.2 F (36.2 C)-99.1 F (37.3 C)] 98.5 F (36.9 C) (08/12 0513) Pulse Rate:  [68-74] 68 (08/12 0513) Resp:  [18-20] 18 (08/12 0513) BP: (127-144)/(68-75) 144/75 mmHg (08/12 0513) SpO2:  [97 %-98 %] 98 % (08/12 0513) Weight change:  Last BM Date: 07/13/13  Intake/Output from previous day: 08/11 0701 - 08/12 0700 In: 6482.1 [P.O.:720; I.V.:5762.1] Out: 350 [Urine:350]  PHYSICAL EXAM General appearance: alert, cooperative and no distress Resp: clear to auscultation bilaterally Cardio: regular rate and rhythm, S1, S2 normal, no murmur, click, rub or gallop GI: soft, non-tender; bowel sounds normal; no masses,  no organomegaly Extremities: extremities normal, atraumatic, no cyanosis or edema  Lab Results:    Basic Metabolic Panel:  Recent Labs  16/10/96 0715 07/15/13 0547  NA 138 139  K 4.4 3.9  CL 105 108  CO2 20 19  GLUCOSE 135* 131*  BUN 41* 24*  CREATININE 2.44* 1.47*  CALCIUM 9.2 8.9   Liver Function Tests:  Recent Labs  07/13/13 1149 07/14/13 0715  AST 19 18  ALT 13 12  ALKPHOS 77 75  BILITOT 0.3 0.3  PROT 7.2 7.2  ALBUMIN 3.5 3.5    Recent Labs  07/13/13 1149  LIPASE 40   No results found for this basename: AMMONIA,  in the last 72 hours CBC:  Recent Labs  07/13/13 1149  07/15/13 0547 07/16/13 0445  WBC 8.0  < > 7.9 8.2  NEUTROABS 5.9  --  5.7  --   HGB 7.9*  < > 7.8* 7.3*  HCT 23.6*  < > 23.1* 22.4*  MCV 86.4  < > 86.8 87.2  PLT 171  < > 165 156  < > = values in this interval not displayed. Cardiac  Enzymes:  Recent Labs  07/13/13 0913  TROPONINI <0.30   BNP: No results found for this basename: PROBNP,  in the last 72 hours D-Dimer: No results found for this basename: DDIMER,  in the last 72 hours CBG:  Recent Labs  07/14/13 1608 07/15/13 0715 07/15/13 1152 07/15/13 1658 07/15/13 2134 07/16/13 0719  GLUCAP 82 112* 109* 87 132* 92   Hemoglobin A1C: No results found for this basename: HGBA1C,  in the last 72 hours Fasting Lipid Panel: No results found for this basename: CHOL, HDL, LDLCALC, TRIG, CHOLHDL, LDLDIRECT,  in the last 72 hours Thyroid Function Tests: No results found for this basename: TSH, T4TOTAL, FREET4, T3FREE, THYROIDAB,  in the last 72 hours Anemia Panel:  Recent Labs  07/15/13 1326  TIBC 244*  IRON 24*   Coagulation: No results found for this basename: LABPROT, INR,  in the last 72 hours Urine Drug Screen: Drugs of Abuse  No results found for this basename: labopia, cocainscrnur, labbenz, amphetmu, thcu, labbarb    Alcohol Level: No results found for this basename: ETH,  in the last 72 hours Urinalysis:  Recent Labs  07/13/13 1248  COLORURINE YELLOW  LABSPEC 1.015  PHURINE 5.5  GLUCOSEU NEGATIVE  HGBUR NEGATIVE  BILIRUBINUR NEGATIVE  KETONESUR NEGATIVE  PROTEINUR NEGATIVE  UROBILINOGEN 0.2  NITRITE NEGATIVE  LEUKOCYTESUR NEGATIVE   Misc. Labs:  ABGS No results found for this basename: PHART, PCO2, PO2ART, TCO2, HCO3,  in the last 72 hours CULTURES No results found for this or any previous visit (from the past 240 hour(s)). Studies/Results: Dg Chest Port 1 View  07/14/2013   *RADIOLOGY REPORT*  Clinical Data: PICC line  PORTABLE CHEST - 1 VIEW  Comparison: 06/28/2013  Findings: Left arm PICC tip in the SVC in good position.  Cardiac enlargement with mild vascular congestion.  Negative for edema.  IMPRESSION: PICC tip in the mid SVC.  Mild vascular congestion without edema   Original Report Authenticated By: Janeece Riggers, M.D.     Medications:  Prior to Admission:  Prescriptions prior to admission  Medication Sig Dispense Refill  . amLODipine (NORVASC) 10 MG tablet Take 10 mg by mouth daily.      Marland Kitchen aspirin EC 81 MG tablet Take 81 mg by mouth daily.      Marland Kitchen atorvastatin (LIPITOR) 40 MG tablet Take 40 mg by mouth daily.      . benazepril (LOTENSIN) 40 MG tablet Take 1 tablet (40 mg total) by mouth daily.  30 tablet  12  . Bromfenac Sodium (BROMDAY) 0.09 % SOLN Place 1 drop into the right eye 3 (three) times daily.      Marland Kitchen doxepin (SINEQUAN) 25 MG capsule Take 25 mg by mouth at bedtime.      Marland Kitchen estradiol (ESTRACE) 0.5 MG tablet Take 0.5 mg by mouth daily.        . fentaNYL (DURAGESIC - DOSED MCG/HR) 100 MCG/HR Place 1 patch onto the skin every 3 (three) days.      . hydrALAZINE (APRESOLINE) 25 MG tablet Take 25 mg by mouth every 8 (eight) hours.      . levETIRAcetam (KEPPRA) 250 MG tablet Take 1 tablet (250 mg total) by mouth 2 (two) times daily.  60 tablet  12  . magnesium hydroxide (MILK OF MAGNESIA) 400 MG/5ML suspension Takes couple of tablespoons daily.      . metFORMIN (GLUCOPHAGE) 500 MG tablet Take 500 mg by mouth 2 (two) times daily with a meal.       . metoprolol tartrate (LOPRESSOR) 25 MG tablet Take 12.5 mg by mouth 2 (two) times daily.      . nitroGLYCERIN (NITROSTAT) 0.4 MG SL tablet Place 0.4 mg under the tongue every 5 (five) minutes as needed for chest pain.       . pantoprazole (PROTONIX) 40 MG tablet Take 40 mg by mouth 2 (two) times daily.      . potassium chloride (K-DUR) 10 MEQ tablet Take 2 tablets (20 mEq total) by mouth daily.  60 tablet  3  . senna-docusate (SENOKOT-S) 8.6-50 MG per tablet Take 1 tablet by mouth daily as needed for constipation.       . traMADol (ULTRAM) 50 MG tablet Take 50 mg by mouth every 6 (six) hours as needed for pain.       Scheduled: . amLODipine  10 mg Oral Daily  . atorvastatin  40 mg Oral q1800  .  ceFAZolin (ANCEF) IV  2 g Intravenous On Call to OR  .  chlorhexidine  1 application Topical Once  . doxepin  25 mg Oral QHS  . dronabinol  2.5 mg Oral BID AC  . estradiol  0.5 mg Oral Daily  . fesoterodine  4 mg Oral Daily  . hydrALAZINE  25 mg Oral Q8H  .  insulin aspart  0-5 Units Subcutaneous QHS  . insulin aspart  0-9 Units Subcutaneous TID WC  . ketorolac  1 drop Right Eye TID  . levETIRAcetam  250 mg Oral BID  . metoprolol tartrate  12.5 mg Oral BID  . pantoprazole (PROTONIX) IV  40 mg Intravenous Q12H  . sodium chloride  10-40 mL Intracatheter Q12H   Continuous: . sodium chloride 125 mL/hr at 07/16/13 0800   AVW:[id number], ondansetron, sodium chloride  Assesment: She was admitted with confusion acute renal failure and her hemoglobin level was worse. She has very poor IV access and plans have been made for Port-A-Cath insertion. Her confusion has improved as he frequently does after she comes to the hospital from home. Her renal function is better. Active Problems:   Diabetes mellitus, type II   Hypertension   Obesity   PUD (peptic ulcer disease)   Dehydration   Anemia   Altered mental state    Plan: Continue current medications. She is for Port-A-Cath tomorrow    LOS: 3 days   Verdun Rackley L 07/16/2013, 8:52 AM

## 2013-07-17 ENCOUNTER — Inpatient Hospital Stay (HOSPITAL_COMMUNITY): Payer: PRIVATE HEALTH INSURANCE | Admitting: Anesthesiology

## 2013-07-17 ENCOUNTER — Encounter (HOSPITAL_COMMUNITY): Payer: Self-pay | Admitting: *Deleted

## 2013-07-17 ENCOUNTER — Inpatient Hospital Stay (HOSPITAL_COMMUNITY): Payer: PRIVATE HEALTH INSURANCE

## 2013-07-17 ENCOUNTER — Encounter (HOSPITAL_COMMUNITY): Payer: Self-pay | Admitting: Anesthesiology

## 2013-07-17 ENCOUNTER — Encounter (HOSPITAL_COMMUNITY)
Admission: EM | Disposition: A | Discharge status: Home Health | Payer: Self-pay | Source: Home / Self Care | Attending: Pulmonary Disease

## 2013-07-17 HISTORY — PX: PORTACATH PLACEMENT: SHX2246

## 2013-07-17 LAB — BASIC METABOLIC PANEL
BUN: 7 mg/dL (ref 6–23)
CO2: 22 mEq/L (ref 19–32)
Chloride: 108 mEq/L (ref 96–112)
Creatinine, Ser: 1.04 mg/dL (ref 0.50–1.10)
Glucose, Bld: 107 mg/dL — ABNORMAL HIGH (ref 70–99)

## 2013-07-17 LAB — CBC
HCT: 27.3 % — ABNORMAL LOW (ref 36.0–46.0)
Hemoglobin: 9.3 g/dL — ABNORMAL LOW (ref 12.0–15.0)
MCV: 84.8 fL (ref 78.0–100.0)
RBC: 3.22 MIL/uL — ABNORMAL LOW (ref 3.87–5.11)
RDW: 15.5 % (ref 11.5–15.5)
WBC: 9.4 10*3/uL (ref 4.0–10.5)

## 2013-07-17 LAB — GLUCOSE, CAPILLARY
Glucose-Capillary: 111 mg/dL — ABNORMAL HIGH (ref 70–99)
Glucose-Capillary: 116 mg/dL — ABNORMAL HIGH (ref 70–99)

## 2013-07-17 SURGERY — INSERTION, TUNNELED CENTRAL VENOUS DEVICE, WITH PORT
Anesthesia: Monitor Anesthesia Care | Site: Chest | Laterality: Left | Wound class: Clean

## 2013-07-17 MED ORDER — LIDOCAINE HCL (PF) 1 % IJ SOLN
INTRAMUSCULAR | Status: AC
  Filled 2013-07-17: qty 30

## 2013-07-17 MED ORDER — HEPARIN SODIUM (PORCINE) 1000 UNIT/ML IJ SOLN
INTRAMUSCULAR | Status: AC
  Filled 2013-07-17: qty 3

## 2013-07-17 MED ORDER — CEFAZOLIN SODIUM-DEXTROSE 2-3 GM-% IV SOLR
INTRAVENOUS | Status: AC
  Filled 2013-07-17: qty 50

## 2013-07-17 MED ORDER — PROPOFOL 10 MG/ML IV EMUL
INTRAVENOUS | Status: AC
  Filled 2013-07-17: qty 20

## 2013-07-17 MED ORDER — MIDAZOLAM HCL 2 MG/2ML IJ SOLN
INTRAMUSCULAR | Status: AC
  Filled 2013-07-17: qty 2

## 2013-07-17 MED ORDER — HYDROCODONE-ACETAMINOPHEN 5-325 MG PO TABS: 1.0000 | ORAL_TABLET | ORAL | Status: DC | PRN

## 2013-07-17 MED ORDER — FENTANYL CITRATE 0.05 MG/ML IJ SOLN
25.0000 ug | INTRAMUSCULAR | Status: AC
  Administered 2013-07-17 (×2): 25 ug via INTRAVENOUS

## 2013-07-17 MED ORDER — LIDOCAINE HCL (PF) 1 % IJ SOLN
INTRAMUSCULAR | Status: AC
  Filled 2013-07-17: qty 5

## 2013-07-17 MED ORDER — HEPARIN SODIUM (PORCINE) 1000 UNIT/ML IJ SOLN
INTRAMUSCULAR | Status: DC | PRN
  Administered 2013-07-17: 3000 [IU] via INTRAVENOUS

## 2013-07-17 MED ORDER — FENTANYL CITRATE 0.05 MG/ML IJ SOLN
INTRAMUSCULAR | Status: AC
  Filled 2013-07-17: qty 2

## 2013-07-17 MED ORDER — MIDAZOLAM HCL 2 MG/2ML IJ SOLN
1.0000 mg | INTRAMUSCULAR | Status: DC | PRN
  Administered 2013-07-17: 2 mg via INTRAVENOUS

## 2013-07-17 MED ORDER — PROPOFOL INFUSION 10 MG/ML OPTIME
INTRAVENOUS | Status: DC | PRN
  Administered 2013-07-17: 50 ug/kg/min via INTRAVENOUS

## 2013-07-17 MED ORDER — FENTANYL CITRATE 0.05 MG/ML IJ SOLN
INTRAMUSCULAR | Status: DC | PRN
  Administered 2013-07-17 (×2): 25 ug via INTRAVENOUS

## 2013-07-17 MED ORDER — POTASSIUM CHLORIDE CRYS ER 20 MEQ PO TBCR
40.0000 meq | EXTENDED_RELEASE_TABLET | Freq: Once | ORAL | Status: AC
  Administered 2013-07-17: 40 meq via ORAL
  Filled 2013-07-17: qty 2

## 2013-07-17 MED ORDER — SODIUM CHLORIDE 0.9 % IV SOLN
INTRAVENOUS | Status: DC | PRN
  Administered 2013-07-17: 500 mL via INTRAMUSCULAR

## 2013-07-17 MED ORDER — MIDAZOLAM HCL 5 MG/5ML IJ SOLN
INTRAMUSCULAR | Status: DC | PRN
  Administered 2013-07-17 (×2): 1 mg via INTRAVENOUS

## 2013-07-17 MED ORDER — ONDANSETRON HCL 4 MG/2ML IJ SOLN: 4.0000 mg | Freq: Once | INTRAMUSCULAR | Status: DC | PRN

## 2013-07-17 MED ORDER — ONDANSETRON HCL 4 MG/2ML IJ SOLN
4.0000 mg | Freq: Once | INTRAMUSCULAR | Status: AC
  Administered 2013-07-17: 4 mg via INTRAVENOUS

## 2013-07-17 MED ORDER — ONDANSETRON HCL 4 MG/2ML IJ SOLN
INTRAMUSCULAR | Status: AC
  Filled 2013-07-17: qty 2

## 2013-07-17 MED ORDER — FENTANYL CITRATE 0.05 MG/ML IJ SOLN
25.0000 ug | INTRAMUSCULAR | Status: DC | PRN
  Administered 2013-07-17: 50 ug via INTRAVENOUS

## 2013-07-17 MED ORDER — LACTATED RINGERS IV SOLN
INTRAVENOUS | Status: DC
  Administered 2013-07-17: 15:00:00 via INTRAVENOUS

## 2013-07-17 MED ORDER — LIDOCAINE HCL (PF) 1 % IJ SOLN
INTRAMUSCULAR | Status: DC | PRN
  Administered 2013-07-17: 29 mL

## 2013-07-17 SURGICAL SUPPLY — 39 items
APPLIER CLIP 9.375 SM OPEN (CLIP)
BAG DECANTER FOR FLEXI CONT (MISCELLANEOUS) ×2 IMPLANT
BAG HAMPER (MISCELLANEOUS) ×2 IMPLANT
BENZOIN TINCTURE PRP APPL 2/3 (GAUZE/BANDAGES/DRESSINGS) ×2 IMPLANT
CATH HICKMAN DUAL 12.0 (CATHETERS) IMPLANT
CLIP APPLIE 9.375 SM OPEN (CLIP) IMPLANT
CLOTH BEACON ORANGE TIMEOUT ST (SAFETY) ×2 IMPLANT
COVER LIGHT HANDLE STERIS (MISCELLANEOUS) ×4 IMPLANT
DECANTER SPIKE VIAL GLASS SM (MISCELLANEOUS) ×2 IMPLANT
DRAPE C-ARM FOLDED MOBILE STRL (DRAPES) ×2 IMPLANT
DURAPREP 6ML APPLICATOR 50/CS (WOUND CARE) ×2 IMPLANT
ELECT REM PT RETURN 9FT ADLT (ELECTROSURGICAL) ×2
ELECTRODE REM PT RTRN 9FT ADLT (ELECTROSURGICAL) ×1 IMPLANT
GLOVE BIOGEL PI IND STRL 7.5 (GLOVE) ×1 IMPLANT
GLOVE BIOGEL PI INDICATOR 7.5 (GLOVE) ×1
GLOVE ECLIPSE 6.5 STRL STRAW (GLOVE) ×4 IMPLANT
GLOVE ECLIPSE 7.0 STRL STRAW (GLOVE) ×2 IMPLANT
GLOVE EXAM NITRILE MD LF STRL (GLOVE) ×2 IMPLANT
GLOVE INDICATOR 7.0 STRL GRN (GLOVE) ×4 IMPLANT
GOWN STRL REIN XL XLG (GOWN DISPOSABLE) ×4 IMPLANT
IV NS 500ML (IV SOLUTION) ×1
IV NS 500ML BAXH (IV SOLUTION) ×1 IMPLANT
KIT PORT POWER 8FR ISP MRI (CATHETERS) ×2 IMPLANT
KIT ROOM TURNOVER APOR (KITS) ×2 IMPLANT
MANIFOLD NEPTUNE II (INSTRUMENTS) ×2 IMPLANT
NEEDLE HYPO 18GX1.5 BLUNT FILL (NEEDLE) ×2 IMPLANT
NEEDLE HYPO 25X1 1.5 SAFETY (NEEDLE) ×2 IMPLANT
NS IRRIG 1000ML POUR BTL (IV SOLUTION) ×2 IMPLANT
PACK MINOR (CUSTOM PROCEDURE TRAY) ×2 IMPLANT
PAD ARMBOARD 7.5X6 YLW CONV (MISCELLANEOUS) ×2 IMPLANT
SET BASIN LINEN APH (SET/KITS/TRAYS/PACK) ×2 IMPLANT
SET INTRODUCER 12FR PACEMAKER (SHEATH) IMPLANT
SHEATH COOK PEEL AWAY SET 8F (SHEATH) IMPLANT
STRIP CLOSURE SKIN 1/2X4 (GAUZE/BANDAGES/DRESSINGS) ×2 IMPLANT
SUT MNCRL AB 4-0 PS2 18 (SUTURE) ×2 IMPLANT
SUT VIC AB 3-0 SH 27 (SUTURE) ×1
SUT VIC AB 3-0 SH 27X BRD (SUTURE) ×1 IMPLANT
SYR CONTROL 10ML LL (SYRINGE) ×2 IMPLANT
SYRINGE 10CC LL (SYRINGE) ×2 IMPLANT

## 2013-07-17 NOTE — Progress Notes (Signed)
I spoke with [name] at [facility] Keppra po was given at 1800 when pt returned from OR. We are to hold the night time dose. This was reported at shift change to [facility]

## 2013-07-17 NOTE — Progress Notes (Signed)
Dr Juanetta Gosling paged and made aware that pts K+ 3.1 this am. Order received for 40 PO of KCL now and then again at 1230.

## 2013-07-17 NOTE — Anesthesia Postprocedure Evaluation (Addendum)
  Anesthesia Post-op Note  Patient: [name]  Procedure(s) Performed: Procedure(s): INSERTION PORT-A-CATH-left subclavian (Left)  Patient Location: PACU  Anesthesia Type:MAC  Level of Consciousness: awake, alert , oriented and patient cooperative  Airway and Oxygen Therapy: Patient Spontanous Breathing and Patient connected to nasal cannula oxygen  Post-op Pain: none  Post-op Assessment: Post-op Vital signs reviewed, Patient's Cardiovascular Status Stable, Respiratory Function Stable, Patent Airway, No signs of Nausea or vomiting and Pain level controlled  Post-op Vital Signs: Reviewed and stable  Complications: No apparent anesthesia complications 07/18/13  Patient's VSS.  No apparent anesthesia complications.

## 2013-07-17 NOTE — Transfer of Care (Signed)
Immediate Anesthesia Transfer of Care Note  Patient: [name]  Procedure(s) Performed: Procedure(s): INSERTION PORT-A-CATH-left subclavian (Left)  Patient Location: PACU  Anesthesia Type:MAC  Level of Consciousness: awake, alert , oriented and patient cooperative  Airway & Oxygen Therapy: Patient Spontanous Breathing and Patient connected to nasal cannula oxygen  Post-op Assessment: Report given to PACU RN and Post -op Vital signs reviewed and stable  Post vital signs: Reviewed and stable  Complications: No apparent anesthesia complications

## 2013-07-17 NOTE — Progress Notes (Signed)
Day of Surgery  Subjective: No acute change. No new questions or concerns  Objective: Vital signs in last 24 hours: Temp:  [98.2 F (36.8 Wolf)-99.1 F (37.3 Wolf)] 98.3 F (36.8 Wolf) (08/13 1515) Pulse Rate:  [60-79] 63 (08/13 1515) Resp:  [14-20] 20 (08/13 1344) BP: (153-181)/(74-100) 181/82 mmHg (08/13 1344) SpO2:  [98 %] 98 % (08/13 1515) Weight:  [87.998 kg (194 lb)] 87.998 kg (194 lb) (08/13 1515) Last BM Date: 07/13/13  Intake/Output from previous day: 08/12 0701 - 08/13 0700 In: 3345 [P.O.:1380; I.V.:1615; Blood:350] Out: -  Intake/Output this shift: Total I/O In: -  Out: 850 [Urine:850]  General appearance: alert and no distress Resp: clear to auscultation bilaterally Cardio: regular rate and rhythm  Lab Results:   Recent Labs  07/16/13 0445 07/17/13 0929  WBC 8.2 9.4  HGB 7.3* 9.3*  HCT 22.4* 27.3*  PLT 156 136*   BMET  Recent Labs  07/15/13 0547 07/17/13 0929  NA 139 140  K 3.9 3.1*  CL 108 108  CO2 19 22  GLUCOSE 131* 107*  BUN 24* 7  CREATININE 1.47* 1.04  CALCIUM 8.9 8.7   PT/INR No results found for this basename: LABPROT, INR,  in the last 72 hours ABG No results found for this basename: PHART, PCO2, PO2, HCO3,  in the last 72 hours  Studies/Results: No results found.  Anti-infectives: Anti-infectives   Start     Dose/Rate Route Frequency Ordered Stop   07/17/13 0600  ceFAZolin (ANCEF) IVPB 2 g/50 mL premix     2 g 100 mL/hr over 30 Minutes Intravenous On call to O.R. 07/16/13 1811 07/18/13 0559   07/16/13 0815  ceFAZolin (ANCEF) IVPB 2 g/50 mL premix     2 g 100 mL/hr over 30 Minutes Intravenous On call to O.R. 07/16/13 0813 07/17/13 0559      Assessment/Plan: s/p Procedure(s): INSERTION PORT-A-CATH (N/A) Will proceed with Port-A-Cath insertion. Risks benefits alternatives again discussed with patient. No new questions or concerns. We'll proceed with procedure as consented.  LOS: 4 days    [name] 07/17/2013

## 2013-07-17 NOTE — Progress Notes (Signed)
Physical Therapy Treatment Patient Details Name: [name] MRN: [id number] DOB: Mar 14, 1941 Today's Date: 07/17/2013 Time: 4098-1191 PT Time Calculation (min): 26 min  PT Assessment / Plan / Recommendation  History of Present Illness  Pt states she is feeling better.    PT Comments   Yesterday treatment was limited by fatigue.  Pt ambulated greater than twice the distance and states she does not feel fatigued at all.  Follow Up Recommendations  Home health PT     Does the patient have the potential to tolerate intense rehabilitation   N/a  Barriers to Discharge  none      Equipment Recommendations  None recommended by PT    Recommendations for Other Services  none  Frequency Min 3X/week   Progress towards PT Goals Progress towards PT goals: Progressing toward goals  Plan   continue per POC   Precautions / Restrictions   none  Pertinent Vitals/Pain o/10    Mobility  Bed Mobility Bed Mobility: Supine to Sit Supine to Sit: 4: Min guard Sit to Supine: Not Tested (comment) Transfers Sit to Stand: 6: Modified independent (Device/Increase time) Stand to Sit: 6: Modified independent (Device/Increase time) Ambulation/Gait Ambulation/Gait Assistance: 5: Supervision Ambulation Distance (Feet): 100 Feet Assistive device: Rolling walker Ambulation/Gait Assistance Details: began with quad cane.  Ambulation with quad cane brings on significant knee pain.  Counseled pt that I would only walk with walker from now on to decrease knee deterioration.,    Exercises General Exercises - Lower Extremity Ankle Circles/Pumps: 10 reps;Both Quad Sets: 10 reps;Both Gluteal Sets: Both;10 reps Heel Slides: Both;5 reps Hip ABduction/ADduction: Both;5 reps Straight Leg Raises: Both;5 reps   PT Diagnosis:    PT Problem List:   PT Treatment Interventions:     PT Goals (current goals can now be found in the care plan section)    Visit Information  Last PT Received On: 07/17/13     Subjective Data   Feeling better   Cognition    oriented   Balance   no deficits noted  End of Session PT - End of Session Equipment Utilized During Treatment: Gait belt Activity Tolerance: Patient tolerated treatment well Patient left: in chair;with call bell/phone within reach   GP     [name],CINDY 07/17/2013, 9:23 AM

## 2013-07-17 NOTE — Op Note (Signed)
Patient:  [name]  DOB:  24-Jul-1941  MRN:  [id number]   Preop Diagnosis:  Chronic thrombophlebitis poor peripheral access  Postop Diagnosis:  The same  Procedure:  Left subclavian vein Port-A-Cath insertion, intraoperative fluoroscopy with surgeon interpretation  Surgeon:  Dr. Tilford Pillar  Anes:  MAC, 1% lidocaine plain for local  Indications:  Patient is a 72 year old female with multiple medical problems who is noted to have limited IV access and difficult with peripheral access. She is no longer PICC line candidate. Risks benefits alternatives of placement of a Port-A-Cath were discussed with the patient's durable power of attorney. Their questions concerns are addressed the patient as consented for the planned procedure.  Procedure note:  Patient is taken to the operating room was placed in a supine position on the OR table time the sedation is administered. At this point her chest was prepped with DuraPrep solution and draped in standard fashion. Time out was performed. Local anesthetic is instilled along the planned course of venipuncture. An 18-gauge introducer needle utilized to identify the left subclavian vein. Good venous return was obtained. A J-wire is advanced without difficulty. It is visualized on fluoroscopy and the course is to be into the superior vena cava. The needle is removed and the wires secured to the drapes with a hemostat. At this point additional local anesthetic is instilled at the port site. The port site was created using a 15 blade scalpel to create the initial skin incision an additional dissection carried out using electrocautery and blunt digital dissection to enlarge the port site. With the port site adequately sized the catheter is advanced to the subcuticular tissue from the port site to the status or site using a subcuticular tunneling device. At this point the dilator insertion sheath was advanced down the J-wire the cyst is done with smooth  transition and is visualize under fluoroscopy. The J-wire and the dilator removed in standard fashion. At this point the catheters trimmed at 22.5 cm after confirming that the tip was in the superior vena cava. The catheter was accessed with a Huber needle and good venous return was obtained. 3000 units of heparin and instilled. At this point the course of the catheter visualized with fluoroscopy there is no noted sharp angulations or kinking. At this time the catheter secured to the deep subcuticular tissue with a Vicryl suture in the deep subcuticular tissues reapproximated using a 3-0 Vicryl in a running continuous fashion. The skin reapproximated with a 4-0 Monocryl in a running subcuticular suture. The skin was washed dried moist dry towel. Benzoin is applied around incision. Half-inch are suture placed. The drapes removed patient left come out of sedation. She stretcher back in stable condition. At the conclusion of procedure all instrument, sponge, needle counts are correct. Patient tolerated procedure extremely well. A stat portable chest x-ray is pending in the PACU.  Complications:  None apparent  EBL:  Minimal  Specimen:  None

## 2013-07-17 NOTE — OR Nursing (Signed)
Reported to Michelle Nasuti.

## 2013-07-17 NOTE — Progress Notes (Signed)
Subjective: She feels well. She has no new complaints. She is set for Port-A-Cath placement today.  Objective: Vital signs in last 24 hours: Temp:  [98.2 F (36.8 C)-99 F (37.2 C)] 99 F (37.2 C) (08/13 0400) Pulse Rate:  [66-88] 79 (08/12 1600) Resp:  [15-20] 20 (08/12 2302) BP: (153-168)/(67-100) 164/90 mmHg (08/12 1800) Weight change:  Last BM Date: 07/13/13  Intake/Output from previous day: 08/12 0701 - 08/13 0700 In: 3345 [P.O.:1380; I.V.:1615; Blood:350] Out: -   PHYSICAL EXAM General appearance: alert, cooperative, mild distress and morbidly obese Resp: clear to auscultation bilaterally Cardio: regular rate and rhythm, S1, S2 normal, no murmur, click, rub or gallop GI: soft, non-tender; bowel sounds normal; no masses,  no organomegaly Extremities: extremities normal, atraumatic, no cyanosis or edema  Lab Results:    Basic Metabolic Panel:  Recent Labs  40/98/11 0547  NA 139  K 3.9  CL 108  CO2 19  GLUCOSE 131*  BUN 24*  CREATININE 1.47*  CALCIUM 8.9   Liver Function Tests: No results found for this basename: AST, ALT, ALKPHOS, BILITOT, PROT, ALBUMIN,  in the last 72 hours No results found for this basename: LIPASE, AMYLASE,  in the last 72 hours No results found for this basename: AMMONIA,  in the last 72 hours CBC:  Recent Labs  07/15/13 0547 07/16/13 0445  WBC 7.9 8.2  NEUTROABS 5.7  --   HGB 7.8* 7.3*  HCT 23.1* 22.4*  MCV 86.8 87.2  PLT 165 156   Cardiac Enzymes: No results found for this basename: CKTOTAL, CKMB, CKMBINDEX, TROPONINI,  in the last 72 hours BNP: No results found for this basename: PROBNP,  in the last 72 hours D-Dimer: No results found for this basename: DDIMER,  in the last 72 hours CBG:  Recent Labs  07/15/13 2134 07/16/13 0719 07/16/13 1147 07/16/13 1647 07/16/13 2054 07/17/13 0757  GLUCAP 132* 92 101* 119* 124* 111*   Hemoglobin A1C: No results found for this basename: HGBA1C,  in the last 72  hours Fasting Lipid Panel: No results found for this basename: CHOL, HDL, LDLCALC, TRIG, CHOLHDL, LDLDIRECT,  in the last 72 hours Thyroid Function Tests: No results found for this basename: TSH, T4TOTAL, FREET4, T3FREE, THYROIDAB,  in the last 72 hours Anemia Panel:  Recent Labs  07/15/13 1326  TIBC 244*  IRON 24*   Coagulation: No results found for this basename: LABPROT, INR,  in the last 72 hours Urine Drug Screen: Drugs of Abuse  No results found for this basename: labopia, cocainscrnur, labbenz, amphetmu, thcu, labbarb    Alcohol Level: No results found for this basename: ETH,  in the last 72 hours Urinalysis: No results found for this basename: COLORURINE, APPERANCEUR, LABSPEC, PHURINE, GLUCOSEU, HGBUR, BILIRUBINUR, KETONESUR, PROTEINUR, UROBILINOGEN, NITRITE, LEUKOCYTESUR,  in the last 72 hours Misc. Labs:  ABGS No results found for this basename: PHART, PCO2, PO2ART, TCO2, HCO3,  in the last 72 hours CULTURES Recent Results (from the past 240 hour(s))  MRSA PCR SCREENING     Status: None   Collection Time    07/16/13  4:45 PM      Result Value Range Status   MRSA by PCR NEGATIVE  NEGATIVE Final   Comment:            The GeneXpert MRSA Assay (FDA     approved for NASAL specimens     only), is one component of a     comprehensive MRSA colonization     surveillance program.  It is not     intended to diagnose MRSA     infection nor to guide or     monitor treatment for     MRSA infections.   Studies/Results: No results found.  Medications:  Prior to Admission:  Prescriptions prior to admission  Medication Sig Dispense Refill  . amLODipine (NORVASC) 10 MG tablet Take 10 mg by mouth daily.      Marland Kitchen aspirin EC 81 MG tablet Take 81 mg by mouth daily.      Marland Kitchen atorvastatin (LIPITOR) 40 MG tablet Take 40 mg by mouth daily.      . benazepril (LOTENSIN) 40 MG tablet Take 1 tablet (40 mg total) by mouth daily.  30 tablet  12  . Bromfenac Sodium (BROMDAY) 0.09 % SOLN  Place 1 drop into the right eye 3 (three) times daily.      Marland Kitchen doxepin (SINEQUAN) 25 MG capsule Take 25 mg by mouth at bedtime.      Marland Kitchen estradiol (ESTRACE) 0.5 MG tablet Take 0.5 mg by mouth daily.        . fentaNYL (DURAGESIC - DOSED MCG/HR) 100 MCG/HR Place 1 patch onto the skin every 3 (three) days.      . hydrALAZINE (APRESOLINE) 25 MG tablet Take 25 mg by mouth every 8 (eight) hours.      . levETIRAcetam (KEPPRA) 250 MG tablet Take 1 tablet (250 mg total) by mouth 2 (two) times daily.  60 tablet  12  . magnesium hydroxide (MILK OF MAGNESIA) 400 MG/5ML suspension Takes couple of tablespoons daily.      . metFORMIN (GLUCOPHAGE) 500 MG tablet Take 500 mg by mouth 2 (two) times daily with a meal.       . metoprolol tartrate (LOPRESSOR) 25 MG tablet Take 12.5 mg by mouth 2 (two) times daily.      . nitroGLYCERIN (NITROSTAT) 0.4 MG SL tablet Place 0.4 mg under the tongue every 5 (five) minutes as needed for chest pain.       . pantoprazole (PROTONIX) 40 MG tablet Take 40 mg by mouth 2 (two) times daily.      . potassium chloride (K-DUR) 10 MEQ tablet Take 2 tablets (20 mEq total) by mouth daily.  60 tablet  3  . senna-docusate (SENOKOT-S) 8.6-50 MG per tablet Take 1 tablet by mouth daily as needed for constipation.       . traMADol (ULTRAM) 50 MG tablet Take 50 mg by mouth every 6 (six) hours as needed for pain.       Scheduled: . amLODipine  10 mg Oral Daily  . atorvastatin  40 mg Oral q1800  .  ceFAZolin (ANCEF) IV  2 g Intravenous On Call to OR  . chlorhexidine  1 application Topical Once  . doxepin  25 mg Oral QHS  . dronabinol  2.5 mg Oral BID AC  . estradiol  0.5 mg Oral Daily  . fesoterodine  4 mg Oral Daily  . hydrALAZINE  25 mg Oral Q8H  . insulin aspart  0-5 Units Subcutaneous QHS  . insulin aspart  0-9 Units Subcutaneous TID WC  . ketorolac  1 drop Right Eye TID  . levETIRAcetam  250 mg Oral BID  . metoprolol tartrate  12.5 mg Oral BID  . pantoprazole  40 mg Oral BID AC  .  sodium chloride  10-40 mL Intracatheter Q12H   Continuous: . sodium chloride    . dextrose 5 % and 0.45% NaCl 50 mL/hr at  07/16/13 2236   ZOX:[id number], ondansetron, sodium chloride, traMADol  Assesment: She was admitted with dehydration acute on chronic renal failure and worsened anemia. She also had altered mental status. She has multiple other medical problems. She has very poor IV access. Her mental status is better and I think this is probably a combination of medications at home and before discharge i will go through  the medications with her family Active Problems:   Diabetes mellitus, type II   Hypertension   Obesity   PUD (peptic ulcer disease)   Dehydration   Anemia   Altered mental state    Plan: For Port-A-Cath today    LOS: 4 days   [name] L 07/17/2013, 8:09 AM

## 2013-07-17 NOTE — Anesthesia Preprocedure Evaluation (Signed)
Anesthesia Evaluation  Patient identified by MRN, date of birth, ID band Patient awake    Reviewed: Allergy & Precautions, H&P , NPO status , Patient's Chart, lab work & pertinent test results, reviewed documented beta blocker date and time   Airway Mallampati: III TM Distance: >3 FB Neck ROM: Full    Dental  (+) Poor Dentition, Missing and Dental Advisory Given   Pulmonary sleep apnea , COPD breath sounds clear to auscultation        Cardiovascular hypertension, - angina+ CAD, + Past MI, + CABG and + Peripheral Vascular Disease + dysrhythmias + pacemaker Rhythm:Regular Rate:Normal     Neuro/Psych    GI/Hepatic PUD, GERD-  Medicated and Controlled,  Endo/Other  diabetes, Type 2  Renal/GU      Musculoskeletal   Abdominal   Peds  Hematology   Anesthesia Other Findings   Reproductive/Obstetrics                           Anesthesia Physical Anesthesia Plan  ASA: IV  Anesthesia Plan: MAC   Post-op Pain Management:    Induction: Intravenous  Airway Management Planned: Simple Face Mask  Additional Equipment:   Intra-op Plan:   Post-operative Plan:   Informed Consent: I have reviewed the patients History and Physical, chart, labs and discussed the procedure including the risks, benefits and alternatives for the proposed anesthesia with the patient or authorized representative who has indicated his/her understanding and acceptance.     Plan Discussed with:   Anesthesia Plan Comments:         Anesthesia Quick Evaluation

## 2013-07-17 NOTE — Anesthesia Procedure Notes (Signed)
Procedure Name: MAC Date/Time: 07/17/2013 4:03 PM Performed by: Carolyne Littles, AMY L Pre-anesthesia Checklist: Patient identified, Timeout performed, Emergency Drugs available, Suction available and Patient being monitored Patient Re-evaluated:Patient Re-evaluated prior to inductionOxygen Delivery Method: Nasal cannula

## 2013-07-18 LAB — GLUCOSE, CAPILLARY

## 2013-07-18 MED ORDER — DOXEPIN HCL 10 MG PO CAPS: 10.0000 mg | ORAL_CAPSULE | Freq: Every day | ORAL | Status: DC

## 2013-07-18 MED ORDER — FENTANYL 75 MCG/HR TD PT72: 1.0000 | MEDICATED_PATCH | TRANSDERMAL | Status: DC

## 2013-07-18 MED ORDER — KETOROLAC TROMETHAMINE 0.5 % OP SOLN: 1.0000 [drp] | Freq: Three times a day (TID) | OPHTHALMIC | Status: DC

## 2013-07-18 MED ORDER — ONDANSETRON HCL 4 MG PO TABS: 4.0000 mg | ORAL_TABLET | Freq: Three times a day (TID) | ORAL | Status: DC | PRN

## 2013-07-18 MED ORDER — DRONABINOL 2.5 MG PO CAPS: 2.5000 mg | ORAL_CAPSULE | Freq: Two times a day (BID) | ORAL | Status: DC

## 2013-07-18 NOTE — Discharge Summary (Signed)
Physician Discharge Summary  Patient ID: [name] MRN: [id number] DOB/AGE: 74-Dec-1942 74 y.o. Primary Care Physician:Tamikka Pilger L, MD Admit date: 07/13/2013 Discharge date: 07/18/2013    Discharge Diagnoses:  Acute on chronic renal failure Active Problems:   Diabetes mellitus, type II   Hypertension   Obesity   PUD (peptic ulcer disease)   Dehydration   Anemia   Altered mental state  obesity Hypokalemia Lack of venous access Status post pacemaker Gastroparesis    Medication List    STOP taking these medications       fentaNYL 100 MCG/HR  Commonly known as:  DURAGESIC - dosed mcg/hr  Replaced by:  fentaNYL 75 MCG/HR      TAKE these medications       amLODipine 10 MG tablet  Commonly known as:  NORVASC  Take 10 mg by mouth daily.     aspirin EC 81 MG tablet  Take 81 mg by mouth daily.     atorvastatin 40 MG tablet  Commonly known as:  LIPITOR  Take 40 mg by mouth daily.     benazepril 40 MG tablet  Commonly known as:  LOTENSIN  Take 1 tablet (40 mg total) by mouth daily.     BROMDAY 0.09 % Soln  Generic drug:  Bromfenac Sodium  Place 1 drop into the right eye 3 (three) times daily.     doxepin 10 MG capsule  Commonly known as:  SINEQUAN  Take 1 capsule (10 mg total) by mouth at bedtime.     dronabinol 2.5 MG capsule  Commonly known as:  MARINOL  Take 1 capsule (2.5 mg total) by mouth 2 (two) times daily before lunch and supper.     estradiol 0.5 MG tablet  Commonly known as:  ESTRACE  Take 0.5 mg by mouth daily.     fentaNYL 75 MCG/HR  Commonly known as:  DURAGESIC  Place 1 patch (75 mcg total) onto the skin every 3 (three) days.     hydrALAZINE 25 MG tablet  Commonly known as:  APRESOLINE  Take 25 mg by mouth every 8 (eight) hours.     ketorolac 0.5 % ophthalmic solution  Commonly known as:  ACULAR  Place 1 drop into the right eye 3 (three) times daily.     levETIRAcetam 250 MG tablet  Commonly known as:  KEPPRA  Take 1 tablet  (250 mg total) by mouth 2 (two) times daily.     magnesium hydroxide 400 MG/5ML suspension  Commonly known as:  MILK OF MAGNESIA  Takes couple of tablespoons daily.     metFORMIN 500 MG tablet  Commonly known as:  GLUCOPHAGE  Take 500 mg by mouth 2 (two) times daily with a meal.     metoprolol tartrate 25 MG tablet  Commonly known as:  LOPRESSOR  Take 12.5 mg by mouth 2 (two) times daily.     nitroGLYCERIN 0.4 MG SL tablet  Commonly known as:  NITROSTAT  Place 0.4 mg under the tongue every 5 (five) minutes as needed for chest pain.     ondansetron 4 MG tablet  Commonly known as:  ZOFRAN  Take 1 tablet (4 mg total) by mouth every 8 (eight) hours as needed.     pantoprazole 40 MG tablet  Commonly known as:  PROTONIX  Take 40 mg by mouth 2 (two) times daily.     potassium chloride 10 MEQ tablet  Commonly known as:  K-DUR  Take 2 tablets (20 mEq total) by mouth  daily.     senna-docusate 8.6-50 MG per tablet  Commonly known as:  Senokot-S  Take 1 tablet by mouth daily as needed for constipation.     traMADol 50 MG tablet  Commonly known as:  ULTRAM  Take 50 mg by mouth every 6 (six) hours as needed for pain.        Discharged Condition: Improved    Consults: Gastroenterology general surgery  Significant Diagnostic Studies: Ct Head Wo Contrast  06/30/2013   *RADIOLOGY REPORT*  Clinical Data: Stroke  CT HEAD WITHOUT CONTRAST  Technique:  Contiguous axial images were obtained from the base of the skull through the vertex without contrast.  Comparison: 06/28/2013  Findings: Mild chronic ischemic changes in the periventricular white matter.  Mild global atrophy appropriate to age.  No mass effect, midline shift, or acute intracranial hemorrhage.  Cranium is intact.  Mastoid air cells are clear.  IMPRESSION: No acute intracranial pathology.  Chronic ischemic changes and atrophy are noted.   Original Report Authenticated By: Jolaine Click, M.D.   Ct Head Wo Contrast  06/28/2013    *RADIOLOGY REPORT*  Clinical Data: Mental status change, history hypertension, diabetes, seizures, COPD, vascular disease  CT HEAD WITHOUT CONTRAST  Technique:  Contiguous axial images were obtained from the base of the skull through the vertex without contrast.  Comparison: None  Findings: Generalized atrophy. Normal ventricular morphology. No midline shift or mass effect. Small vessel chronic ischemic changes of deep cerebral white matter. No intracranial hemorrhage, mass lesion, or evidence of acute infarction. No extra-axial fluid collections. Atherosclerotic calcifications at skull base. No acute bone or sinus abnormality.  IMPRESSION: Atrophy with small vessel chronic ischemic changes of deep cerebral white matter. No acute intracranial abnormalities.   Original Report Authenticated By: Ulyses Southward, M.D.   Ct Abdomen Pelvis W Contrast  06/29/2013   *RADIOLOGY REPORT*  Clinical Data: Abdominal pain.  Confusion.  Decreased appetite and epigastric pain.  Constipation.  CT ABDOMEN AND PELVIS WITH CONTRAST  Technique:  Multidetector CT imaging of the abdomen and pelvis was performed following the standard protocol during bolus administration of intravenous contrast.  Contrast: 50mL OMNIPAQUE IOHEXOL 300 MG/ML  SOLN, OMNIPAQUE IOHEXOL 300 MG/ML  SOLN  Comparison: 09/16/2012  Findings: Atelectasis or fibrosis in the lung bases.  Cardiac enlargement.  Surgical absence of the gallbladder.  Small esophageal hiatal hernia.  Gastric wall is not thickened.  Small bowel are not abnormally distended.  No bowel wall thickening.  Stool filled colon without distension or wall thickening.  The liver, spleen, pancreas, adrenal glands, kidneys, inferior vena cava, and retroperitoneal lymph nodes are unremarkable. Calcification of abdominal aorta without aneurysm.  No free fluid or free air in the abdomen.  Prominent visceral adipose tissues. Umbilical/periumbilical hernia containing fat.  Scattered surgical clips in the  abdominal wall.  Pelvis:  Surgical clips in the right groin/inguinal region. Visualization of portions of the pelvis is limited due to streak artifact from right hip prosthesis.  The bladder wall appears mildly thickened.  This could be due to cystitis.  The uterus appears to be surgically absent.  No abnormal adnexal masses. Stool filled rectosigmoid colon.  No evidence of diverticulitis. The appendix is not identified.  No free or loculated pelvic fluid collections.  No significant pelvic lymphadenopathy.  Postoperative and degenerative changes in the lumbar spine.  There is anterior subluxation of L4 on L5 but this appears stable since previous study.  No destructive bone lesions appreciated.  Degenerative disc disease at  L2-3.  IMPRESSION: Suggestion of mild bladder wall thickening which could represent cystitis.  No evidence of bowel obstruction.  No acute processes otherwise identified.   Original Report Authenticated By: Burman Nieves, M.D.   US Carotid Duplex Bilateral  07/01/2013   *RADIOLOGY REPORT*  Clinical Data: Hypertension, diabetes, elevated cholesterol  BILATERAL CAROTID DUPLEX ULTRASOUND  Technique: Gray scale imaging, color Doppler and duplex ultrasound were performed of bilateral carotid and vertebral arteries in the neck.  Comparison:  None.  Criteria:  Quantification of carotid stenosis is based on velocity parameters that correlate the residual internal carotid diameter with NASCET-based stenosis levels, using the diameter of the distal internal carotid lumen as the denominator for stenosis measurement.  The following velocity measurements were obtained:                   PEAK SYSTOLIC/END DIASTOLIC RIGHT ICA:                        100/12cm/sec CCA:                        63/6cm/sec SYSTOLIC ICA/CCA RATIO:     1.60 DIASTOLIC ICA/CCA RATIO:    2.00 ECA:                        96cm/sec  LEFT ICA:                        65/12cm/sec CCA:                        81/15cm/sec SYSTOLIC ICA/CCA  RATIO:     0.80 DIASTOLIC ICA/CCA RATIO:    0.85 ECA:                        70cm/sec  Findings:  RIGHT CAROTID ARTERY: Minor right carotid system plaque formation. No hemodynamically significant right ICA stenosis, velocity elevation, turbulent flow.  RIGHT VERTEBRAL ARTERY:  Antegrade  LEFT CAROTID ARTERY: Similar minor plaque formation.  No hemodynamically significant left ICA stenosis, velocity elevation, or turbulent flow.  LEFT VERTEBRAL ARTERY:  Antegrade  IMPRESSION: Minor carotid plaque formation.  No hemodynamically significant ICA stenosis by ultrasound   Original Report Authenticated By: Judie Petit. Miles Costain, M.D.   Dg Chest Port 1 View  07/17/2013   *RADIOLOGY REPORT*  Clinical Data: Post left subclavian Port-A-Cath placement.  PORTABLE CHEST - 1 VIEW  Comparison: 07/14/2013  Findings: Exam demonstrates no change in a left-sided PICC line. Dual lead right-sided cardiac pacemaker unchanged.  There has been interval placement of a left subclavian Port-A-Cath with tip over the region of the SVC at the level of the carina.  Lungs are adequately inflated without focal consolidation or effusion. Continued minimal prominence of the perihilar vasculature as cannot exclude mild vascular congestion.  There is no evidence of pneumothorax.  There is mild stable cardiomegaly. Remainder of the exam is unchanged.  IMPRESSION: Suggestion of minimal vascular congestion and mild cardiomegaly unchanged.  Tubes and lines as described.  Interval placement of a left subclavian Port-A-Cath with tip over the SVC.   Original Report Authenticated By: Elberta Fortis, M.D.   Dg Chest Port 1 View  07/14/2013   *RADIOLOGY REPORT*  Clinical Data: PICC line  PORTABLE CHEST - 1 VIEW  Comparison: 06/28/2013  Findings: Left arm PICC tip in the SVC in  good position.  Cardiac enlargement with mild vascular congestion.  Negative for edema.  IMPRESSION: PICC tip in the mid SVC.  Mild vascular congestion without edema   Original Report Authenticated  By: Janeece Riggers, M.D.   Dg Chest Port 1 View  07/01/2013   *RADIOLOGY REPORT*  Clinical Data: PICC line placement  PORTABLE CHEST - 1 VIEW  Comparison: 06/28/2013  Findings: Left-sided PICC line tip terminates over the mid SVC. Evidence of CABG noted.  Right-sided dual lead pacer in place. Probable skin fold over the left lung apex.  No evidence of pneumothorax.  Technique was optimized for detection of the recently placed line.  Cardiomegaly noted without evidence for edema.  IMPRESSION: Left-sided PICC line with tip over the mid SVC, no pneumothorax.   Original Report Authenticated By: Christiana Pellant, M.D.   Dg Abd Acute W/chest  06/28/2013   *RADIOLOGY REPORT*  Clinical Data: Constipation, confusion, vomiting, hypertension, diabetes, COPD, GERD  ACUTE ABDOMEN SERIES (ABDOMEN 2 VIEW & CHEST 1 VIEW)  Comparison: 03/03/2013  Findings: Right subclavian transvenous pacemaker leads project over right atrium and right ventricle. Enlargement of cardiac silhouette post CABG. Tortuous aorta. Pulmonary vascularity normal. Lungs clear. No pleural effusion or pneumothorax. Prior lumbar fusion. Surgical clips right upper quadrant question cholecystectomy. Prior right hip replacement. Nonobstructive bowel gas pattern. No bowel dilatation, bowel wall thickening, or free intraperitoneal air. Osteoarthritic changes left hip. Diffuse osseous demineralization.  IMPRESSION: No acute abdominal findings. Enlargement of cardiac silhouette post pacemaker and CABG.   Original Report Authenticated By: Ulyses Southward, M.D.   Dg C-arm 1-60 Min-no Report  07/17/2013   CLINICAL DATA: port a cath insertion   C-ARM 1-60 MINUTES  Fluoroscopy was utilized by the requesting physician.  No radiographic  interpretation.     Lab Results: Basic Metabolic Panel:  Recent Labs  16/10/96 0929  NA 140  K 3.1*  CL 108  CO2 22  GLUCOSE 107*  BUN 7  CREATININE 1.04  CALCIUM 8.7   Liver Function Tests: No results found for this basename:  AST, ALT, ALKPHOS, BILITOT, PROT, ALBUMIN,  in the last 72 hours   CBC:  Recent Labs  07/16/13 0445 07/17/13 0929  WBC 8.2 9.4  HGB 7.3* 9.3*  HCT 22.4* 27.3*  MCV 87.2 84.8  PLT 156 136*    Recent Results (from the past 240 hour(s))  MRSA PCR SCREENING     Status: None   Collection Time    07/16/13  4:45 PM      Result Value Range Status   MRSA by PCR NEGATIVE  NEGATIVE Final   Comment:            The GeneXpert MRSA Assay (FDA     approved for NASAL specimens     only), is one component of a     comprehensive MRSA colonization     surveillance program. It is not     intended to diagnose MRSA     infection nor to guide or     monitor treatment for     MRSA infections.     Hospital Course: She was admitted with altered mental status dehydration acute on chronic renal failure and anemia. She was started on IV fluids and her renal function improved. Her anemia improved. She had GI consultation it was felt that she did not need any procedures during this hospitalization. She had trouble getting IV access and this has been a problem the last several admissions so she had Port-A-Cath placed.  Her mental status returned to normal. She was comfortable at the time of discharge  Discharge Exam: Blood pressure 136/73, pulse 62, temperature 98.2 F (36.8 C), temperature source Oral, resp. rate 21, height 5\' 2"  (1.575 m), weight 94.8 kg (208 lb 15.9 oz), SpO2 95.00%. She is awake and alert. She has minimal confusion which is her baseline. Her chest is clear. Her heart is regular  Disposition: Home with home health services      Discharge Orders   Future Orders Complete By Expires   Discharge patient  As directed    Face-to-face encounter (required for Medicare/Medicaid patients)  As directed    Comments:     I Keriann Rankin L certify that this patient is under my care and that I, or a nurse practitioner or physician's assistant working with me, had a face-to-face encounter that  meets the physician face-to-face encounter requirements with this patient on 07/18/2013. The encounter with the patient was in whole, or in part for the following medical condition(s) which is the primary reason for home health care (List medical condition): Acute renal failure/altered mental status   Questions:     The encounter with the patient was in whole, or in part, for the following medical condition, which is the primary reason for home health care:  Acute renal failure/altered mental status   I certify that, based on my findings, the following services are medically necessary home health services:  Nursing   Physical therapy   My clinical findings support the need for the above services:  Unable to leave home safely without assistance and/or assistive device   Further, I certify that my clinical findings support that this patient is homebound due to:  Unable to leave home safely without assistance   Reason for Medically Necessary Home Health Services:  Skilled Nursing- Change/Decline in Patient Status   Home Health  As directed    Scheduling Instructions:     Please check CBC and BMET on 07/22/2013   Questions:     To provide the following care/treatments:  PT   RN        Signed: Fredirick Maudlin Pager [phone]  07/18/2013, 8:29 AM

## 2013-07-18 NOTE — Progress Notes (Signed)
Subjective: She had Port-A-Cath placed yesterday. She feels much better. She is awake and alert. Her renal function and hemoglobin were improved yesterday.  Objective: Vital signs in last 24 hours: Temp:  [97.8 F (36.6 C)-99.1 F (37.3 C)] 98.2 F (36.8 C) (08/14 0400) Pulse Rate:  [60-69] 62 (08/14 0600) Resp:  [14-26] 21 (08/14 0600) BP: (92-195)/(50-100) 136/73 mmHg (08/14 0600) SpO2:  [95 %-100 %] 95 % (08/14 0600) Weight:  [87.998 kg (194 lb)-94.8 kg (208 lb 15.9 oz)] 94.8 kg (208 lb 15.9 oz) (08/14 0500) Weight change:  Last BM Date: 07/13/13  Intake/Output from previous day: 08/13 0701 - 08/14 0700 In: 800 [I.V.:800] Out: 1100 [Urine:1100]  PHYSICAL EXAM General appearance: alert, cooperative, no distress and morbidly obese Resp: clear to auscultation bilaterally Cardio: regular rate and rhythm, S1, S2 normal, no murmur, click, rub or gallop GI: soft, non-tender; bowel sounds normal; no masses,  no organomegaly Extremities: extremities normal, atraumatic, no cyanosis or edema  Lab Results:    Basic Metabolic Panel:  Recent Labs  40/98/11 0929  NA 140  K 3.1*  CL 108  CO2 22  GLUCOSE 107*  BUN 7  CREATININE 1.04  CALCIUM 8.7   Liver Function Tests: No results found for this basename: AST, ALT, ALKPHOS, BILITOT, PROT, ALBUMIN,  in the last 72 hours No results found for this basename: LIPASE, AMYLASE,  in the last 72 hours No results found for this basename: AMMONIA,  in the last 72 hours CBC:  Recent Labs  07/16/13 0445 07/17/13 0929  WBC 8.2 9.4  HGB 7.3* 9.3*  HCT 22.4* 27.3*  MCV 87.2 84.8  PLT 156 136*   Cardiac Enzymes: No results found for this basename: CKTOTAL, CKMB, CKMBINDEX, TROPONINI,  in the last 72 hours BNP: No results found for this basename: PROBNP,  in the last 72 hours D-Dimer: No results found for this basename: DDIMER,  in the last 72 hours CBG:  Recent Labs  07/17/13 1147 07/17/13 1443 07/17/13 1654 07/17/13 1802  07/17/13 2049 07/18/13 0728  GLUCAP 107* 91 81 86 116* 94   Hemoglobin A1C: No results found for this basename: HGBA1C,  in the last 72 hours Fasting Lipid Panel: No results found for this basename: CHOL, HDL, LDLCALC, TRIG, CHOLHDL, LDLDIRECT,  in the last 72 hours Thyroid Function Tests: No results found for this basename: TSH, T4TOTAL, FREET4, T3FREE, THYROIDAB,  in the last 72 hours Anemia Panel:  Recent Labs  07/15/13 1326  TIBC 244*  IRON 24*   Coagulation: No results found for this basename: LABPROT, INR,  in the last 72 hours Urine Drug Screen: Drugs of Abuse  No results found for this basename: labopia, cocainscrnur, labbenz, amphetmu, thcu, labbarb    Alcohol Level: No results found for this basename: ETH,  in the last 72 hours Urinalysis: No results found for this basename: COLORURINE, APPERANCEUR, LABSPEC, PHURINE, GLUCOSEU, HGBUR, BILIRUBINUR, KETONESUR, PROTEINUR, UROBILINOGEN, NITRITE, LEUKOCYTESUR,  in the last 72 hours Misc. Labs:  ABGS No results found for this basename: PHART, PCO2, PO2ART, TCO2, HCO3,  in the last 72 hours CULTURES Recent Results (from the past 240 hour(s))  MRSA PCR SCREENING     Status: None   Collection Time    07/16/13  4:45 PM      Result Value Range Status   MRSA by PCR NEGATIVE  NEGATIVE Final   Comment:            The GeneXpert MRSA Assay (FDA  approved for NASAL specimens     only), is one component of a     comprehensive MRSA colonization     surveillance program. It is not     intended to diagnose MRSA     infection nor to guide or     monitor treatment for     MRSA infections.   Studies/Results: Dg Chest Port 1 View  07/17/2013   *RADIOLOGY REPORT*  Clinical Data: Post left subclavian Port-A-Cath placement.  PORTABLE CHEST - 1 VIEW  Comparison: 07/14/2013  Findings: Exam demonstrates no change in a left-sided PICC line. Dual lead right-sided cardiac pacemaker unchanged.  There has been interval placement of a  left subclavian Port-A-Cath with tip over the region of the SVC at the level of the carina.  Lungs are adequately inflated without focal consolidation or effusion. Continued minimal prominence of the perihilar vasculature as cannot exclude mild vascular congestion.  There is no evidence of pneumothorax.  There is mild stable cardiomegaly. Remainder of the exam is unchanged.  IMPRESSION: Suggestion of minimal vascular congestion and mild cardiomegaly unchanged.  Tubes and lines as described.  Interval placement of a left subclavian Port-A-Cath with tip over the SVC.   Original Report Authenticated By: Elberta Fortis, M.D.   Dg C-arm 1-60 Min-no Report  07/17/2013   CLINICAL DATA: port a cath insertion   C-ARM 1-60 MINUTES  Fluoroscopy was utilized by the requesting physician.  No radiographic  interpretation.     Medications:  Prior to Admission:  Prescriptions prior to admission  Medication Sig Dispense Refill  . amLODipine (NORVASC) 10 MG tablet Take 10 mg by mouth daily.      Marland Kitchen aspirin EC 81 MG tablet Take 81 mg by mouth daily.      Marland Kitchen atorvastatin (LIPITOR) 40 MG tablet Take 40 mg by mouth daily.      . benazepril (LOTENSIN) 40 MG tablet Take 1 tablet (40 mg total) by mouth daily.  30 tablet  12  . Bromfenac Sodium (BROMDAY) 0.09 % SOLN Place 1 drop into the right eye 3 (three) times daily.      Marland Kitchen doxepin (SINEQUAN) 25 MG capsule Take 25 mg by mouth at bedtime.      Marland Kitchen estradiol (ESTRACE) 0.5 MG tablet Take 0.5 mg by mouth daily.        . fentaNYL (DURAGESIC - DOSED MCG/HR) 100 MCG/HR Place 1 patch onto the skin every 3 (three) days.      . hydrALAZINE (APRESOLINE) 25 MG tablet Take 25 mg by mouth every 8 (eight) hours.      . levETIRAcetam (KEPPRA) 250 MG tablet Take 1 tablet (250 mg total) by mouth 2 (two) times daily.  60 tablet  12  . magnesium hydroxide (MILK OF MAGNESIA) 400 MG/5ML suspension Takes couple of tablespoons daily.      . metFORMIN (GLUCOPHAGE) 500 MG tablet Take 500 mg by mouth  2 (two) times daily with a meal.       . metoprolol tartrate (LOPRESSOR) 25 MG tablet Take 12.5 mg by mouth 2 (two) times daily.      . nitroGLYCERIN (NITROSTAT) 0.4 MG SL tablet Place 0.4 mg under the tongue every 5 (five) minutes as needed for chest pain.       . pantoprazole (PROTONIX) 40 MG tablet Take 40 mg by mouth 2 (two) times daily.      . potassium chloride (K-DUR) 10 MEQ tablet Take 2 tablets (20 mEq total) by mouth daily.  60  tablet  3  . senna-docusate (SENOKOT-S) 8.6-50 MG per tablet Take 1 tablet by mouth daily as needed for constipation.       . traMADol (ULTRAM) 50 MG tablet Take 50 mg by mouth every 6 (six) hours as needed for pain.       Scheduled: . amLODipine  10 mg Oral Daily  . atorvastatin  40 mg Oral q1800  . doxepin  25 mg Oral QHS  . dronabinol  2.5 mg Oral BID AC  . estradiol  0.5 mg Oral Daily  . fesoterodine  4 mg Oral Daily  . hydrALAZINE  25 mg Oral Q8H  . insulin aspart  0-5 Units Subcutaneous QHS  . insulin aspart  0-9 Units Subcutaneous TID WC  . ketorolac  1 drop Right Eye TID  . levETIRAcetam  250 mg Oral BID  . metoprolol tartrate  12.5 mg Oral BID  . pantoprazole  40 mg Oral BID AC  . sodium chloride  10-40 mL Intracatheter Q12H   Continuous: . sodium chloride 50 mL/hr at 07/17/13 1900   ZOX:[id number], nitroGLYCERIN, ondansetron, sodium chloride, traMADol  Assesment: She came in with anemia dehydration and acute on chronic renal failure diabetes and altered mental status. She is improved. She had trouble with venous access which has been a problem the last several admissions so she had Port-A-Cath placed. She is much improved. She is ready for discharge Active Problems:   Diabetes mellitus, type II   Hypertension   Obesity   PUD (peptic ulcer disease)   Dehydration   Anemia   Altered mental state    Plan: Discharge home today    LOS: 5 days   Karman Veney L 07/18/2013, 8:14 AM

## 2013-07-18 NOTE — Progress Notes (Signed)
Patient ready for discharge. PICC line discontinued and instructions given on how to care for site.  Patient and daughter verbalized understanding of site care.  Discharge instructions reviewed with patient and patients daughter.  Scripts given to patients daughter and patient and daughter verbalized understanding of discharge instructions.  No acute distress noted.  Patient out via wheelchair to awaiting vehicle for discharge home.

## 2013-07-18 NOTE — Progress Notes (Signed)
UR chart review completed.  

## 2013-07-19 ENCOUNTER — Encounter (HOSPITAL_COMMUNITY): Payer: Self-pay | Admitting: General Surgery

## 2013-07-20 LAB — TYPE AND SCREEN
ABO/RH(D): O NEG
Antibody Screen: NEGATIVE
Unit division: 0

## 2013-07-22 ENCOUNTER — Emergency Department (HOSPITAL_COMMUNITY): Payer: PRIVATE HEALTH INSURANCE

## 2013-07-22 ENCOUNTER — Encounter (HOSPITAL_COMMUNITY): Payer: Self-pay | Admitting: Emergency Medicine

## 2013-07-22 ENCOUNTER — Observation Stay (HOSPITAL_COMMUNITY)
Admission: EM | Admit: 2013-07-22 | Discharge: 2013-07-26 | Disposition: A | Discharge status: Home / Self Care | Payer: PRIVATE HEALTH INSURANCE | Attending: Pulmonary Disease | Admitting: Pulmonary Disease

## 2013-07-22 DIAGNOSIS — IMO0002 Reserved for concepts with insufficient information to code with codable children: Secondary | ICD-10-CM | POA: Diagnosis present

## 2013-07-22 DIAGNOSIS — I10 Essential (primary) hypertension: Secondary | ICD-10-CM | POA: Insufficient documentation

## 2013-07-22 DIAGNOSIS — E785 Hyperlipidemia, unspecified: Secondary | ICD-10-CM | POA: Insufficient documentation

## 2013-07-22 DIAGNOSIS — R410 Disorientation, unspecified: Secondary | ICD-10-CM

## 2013-07-22 DIAGNOSIS — E860 Dehydration: Secondary | ICD-10-CM

## 2013-07-22 DIAGNOSIS — R4182 Altered mental status, unspecified: Secondary | ICD-10-CM

## 2013-07-22 DIAGNOSIS — R404 Transient alteration of awareness: Secondary | ICD-10-CM | POA: Insufficient documentation

## 2013-07-22 DIAGNOSIS — R627 Adult failure to thrive: Secondary | ICD-10-CM | POA: Insufficient documentation

## 2013-07-22 DIAGNOSIS — G40909 Epilepsy, unspecified, not intractable, without status epilepticus: Secondary | ICD-10-CM | POA: Insufficient documentation

## 2013-07-22 DIAGNOSIS — E119 Type 2 diabetes mellitus without complications: Principal | ICD-10-CM | POA: Insufficient documentation

## 2013-07-22 LAB — URINALYSIS, ROUTINE W REFLEX MICROSCOPIC
Hgb urine dipstick: NEGATIVE
Leukocytes, UA: NEGATIVE
Nitrite: NEGATIVE
Protein, ur: NEGATIVE mg/dL
Specific Gravity, Urine: 1.025 (ref 1.005–1.030)
Urobilinogen, UA: 0.2 mg/dL (ref 0.0–1.0)

## 2013-07-22 LAB — CBC WITH DIFFERENTIAL/PLATELET
Basophils Absolute: 0 10*3/uL (ref 0.0–0.1)
Basophils Relative: 0 % (ref 0–1)
Eosinophils Absolute: 0.3 10*3/uL (ref 0.0–0.7)
Hemoglobin: 9.1 g/dL — ABNORMAL LOW (ref 12.0–15.0)
MCH: 28.3 pg (ref 26.0–34.0)
MCHC: 32.2 g/dL (ref 30.0–36.0)
Monocytes Absolute: 0.7 10*3/uL (ref 0.1–1.0)
Monocytes Relative: 9 % (ref 3–12)
Neutrophils Relative %: 71 % (ref 43–77)
RDW: 14.4 % (ref 11.5–15.5)

## 2013-07-22 LAB — COMPREHENSIVE METABOLIC PANEL
Albumin: 3.4 g/dL — ABNORMAL LOW (ref 3.5–5.2)
BUN: 13 mg/dL (ref 6–23)
Creatinine, Ser: 1.3 mg/dL — ABNORMAL HIGH (ref 0.50–1.10)
Potassium: 4.4 mEq/L (ref 3.5–5.1)
Total Protein: 7.1 g/dL (ref 6.0–8.3)

## 2013-07-22 LAB — TROPONIN I: Troponin I: <0.3 ng/mL (ref <0.30)

## 2013-07-22 LAB — LACTIC ACID, PLASMA: Lactic Acid, Venous: 1 mmol/L (ref 0.5–2.2)

## 2013-07-22 MED ORDER — ACETAMINOPHEN 650 MG RE SUPP: 650.0000 mg | Freq: Four times a day (QID) | RECTAL | Status: DC | PRN

## 2013-07-22 MED ORDER — ENOXAPARIN SODIUM 40 MG/0.4ML ~~LOC~~ SOLN
40.0000 mg | SUBCUTANEOUS | Status: DC
  Administered 2013-07-22 – 2013-07-25 (×4): 40 mg via SUBCUTANEOUS
  Filled 2013-07-22 (×4): qty 0.4

## 2013-07-22 MED ORDER — HYDRALAZINE HCL 25 MG PO TABS
25.0000 mg | ORAL_TABLET | Freq: Three times a day (TID) | ORAL | Status: DC
  Administered 2013-07-22 – 2013-07-26 (×10): 25 mg via ORAL
  Filled 2013-07-22 (×10): qty 1

## 2013-07-22 MED ORDER — ATORVASTATIN CALCIUM 40 MG PO TABS
40.0000 mg | ORAL_TABLET | Freq: Every day | ORAL | Status: DC
  Administered 2013-07-22 – 2013-07-25 (×4): 40 mg via ORAL
  Filled 2013-07-22 (×4): qty 1

## 2013-07-22 MED ORDER — LEVETIRACETAM 500 MG PO TABS
250.0000 mg | ORAL_TABLET | Freq: Two times a day (BID) | ORAL | Status: DC
  Administered 2013-07-22: 250 mg via ORAL
  Filled 2013-07-22: qty 1

## 2013-07-22 MED ORDER — LEVALBUTEROL HCL 0.63 MG/3ML IN NEBU: 0.6300 mg | INHALATION_SOLUTION | Freq: Four times a day (QID) | RESPIRATORY_TRACT | Status: DC | PRN

## 2013-07-22 MED ORDER — METOPROLOL TARTRATE 25 MG PO TABS
12.5000 mg | ORAL_TABLET | Freq: Two times a day (BID) | ORAL | Status: DC
  Administered 2013-07-22 – 2013-07-26 (×8): 12.5 mg via ORAL
  Filled 2013-07-22 (×8): qty 1

## 2013-07-22 MED ORDER — SODIUM CHLORIDE 0.9 % IJ SOLN
3.0000 mL | Freq: Two times a day (BID) | INTRAMUSCULAR | Status: DC
  Administered 2013-07-23 – 2013-07-24 (×2): 3 mL via INTRAVENOUS

## 2013-07-22 MED ORDER — AMLODIPINE BESYLATE 5 MG PO TABS
10.0000 mg | ORAL_TABLET | Freq: Every day | ORAL | Status: DC
  Administered 2013-07-22 – 2013-07-26 (×5): 10 mg via ORAL
  Filled 2013-07-22 (×5): qty 2

## 2013-07-22 MED ORDER — ONDANSETRON HCL 4 MG/2ML IJ SOLN: 4.0000 mg | Freq: Four times a day (QID) | INTRAMUSCULAR | Status: DC | PRN

## 2013-07-22 MED ORDER — PANTOPRAZOLE SODIUM 40 MG PO TBEC
40.0000 mg | DELAYED_RELEASE_TABLET | Freq: Two times a day (BID) | ORAL | Status: DC
  Administered 2013-07-22 – 2013-07-26 (×8): 40 mg via ORAL
  Filled 2013-07-22 (×8): qty 1

## 2013-07-22 MED ORDER — ONDANSETRON HCL 4 MG PO TABS: 4.0000 mg | ORAL_TABLET | Freq: Four times a day (QID) | ORAL | Status: DC | PRN

## 2013-07-22 MED ORDER — KETOROLAC TROMETHAMINE 0.5 % OP SOLN
1.0000 [drp] | Freq: Three times a day (TID) | OPHTHALMIC | Status: DC
  Administered 2013-07-23 – 2013-07-25 (×9): 1 [drp] via OPHTHALMIC
  Filled 2013-07-22: qty 3

## 2013-07-22 MED ORDER — SODIUM CHLORIDE 0.9 % IV SOLN
INTRAVENOUS | Status: DC
  Administered 2013-07-24: 12:00:00 via INTRAVENOUS

## 2013-07-22 MED ORDER — POTASSIUM CHLORIDE CRYS ER 10 MEQ PO TBCR
20.0000 meq | EXTENDED_RELEASE_TABLET | Freq: Every day | ORAL | Status: DC
  Administered 2013-07-22 – 2013-07-26 (×5): 20 meq via ORAL
  Filled 2013-07-22 (×8): qty 2

## 2013-07-22 MED ORDER — ASPIRIN EC 81 MG PO TBEC
81.0000 mg | DELAYED_RELEASE_TABLET | Freq: Every day | ORAL | Status: DC
  Administered 2013-07-22 – 2013-07-25 (×4): 81 mg via ORAL
  Filled 2013-07-22 (×5): qty 1

## 2013-07-22 MED ORDER — SODIUM CHLORIDE 0.9 % IV BOLUS (SEPSIS)
500.0000 mL | Freq: Once | INTRAVENOUS | Status: AC
  Administered 2013-07-22: 500 mL via INTRAVENOUS

## 2013-07-22 MED ORDER — DRONABINOL 2.5 MG PO CAPS
2.5000 mg | ORAL_CAPSULE | Freq: Two times a day (BID) | ORAL | Status: DC
  Administered 2013-07-23 – 2013-07-25 (×6): 2.5 mg via ORAL
  Filled 2013-07-22 (×6): qty 1

## 2013-07-22 MED ORDER — ACETAMINOPHEN 325 MG PO TABS
650.0000 mg | ORAL_TABLET | Freq: Four times a day (QID) | ORAL | Status: DC | PRN
  Administered 2013-07-24: 650 mg via ORAL
  Filled 2013-07-22: qty 2

## 2013-07-22 NOTE — H&P (Addendum)
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 She was admitted on 06/27/2013 with acute confusion and abdominal pain. Cardiac enzymes were found to be positive in the absence of chest pain (0.43, 0.89; 0.62; 0.41 respectively) with new lateral ST /T abnormalities by EKG. She WAS exhibiting evidence of CVA with some left sided facial drooping. CT scan that admssion demonstrated no acute intracranial pathology. Chronic ischemic changes and atrophy were  Noted. Neurology was consulted and patient was found to have sub therapeutic dilantin level.Given that the level is undetected, she was switched to keppra .The patient has had carotid Dopplers which were fine. MRI cannot be done due to pace maker .  Daughter states that the patient was just discharged from the hospital on Thursday. States that she has had increasing confusion since yesterday with urinary incontinence.   Daughter states pt was OK Friday but late Saturday evening she became confused and was incontinent of urine. Pt has edema to hands and feet. Keeps head turned to the right. Is able to answer questions         Review of Systems: negative for the following  Constitutional: Denies fever, chills, diaphoresis, appetite change and fatigue.  HEENT: Denies photophobia, eye pain, redness, hearing loss, ear pain, congestion, sore throat, rhinorrhea, sneezing, mouth sores, trouble swallowing, neck pain, neck stiffness and tinnitus.  Respiratory: Denies SOB, DOE, cough, chest tightness, and wheezing.  Cardiovascular: Denies chest pain, palpitations and leg swelling.   Gastrointestinal: Denies nausea, vomiting, abdominal pain, diarrhea, constipation, blood in stool and abdominal distention.  Genitourinary: Denies dysuria, urgency, frequency, hematuria, flank pain and difficulty urinating.  Musculoskeletal: Denies myalgias, back pain, joint swelling, arthralgias and gait problem.  Skin: Denies pallor, rash and wound.  Neurological: Positive for weakness.  Psychiatric/Behavioral: Positive for confusion       Past Medical History  Diagnosis Date  . Diabetes mellitus, type II   . Hypertension   . Arteriosclerotic cardiovascular disease (ASCVD)     multivessel, CABG 6/10.  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  Myoview (11/20/11) showed no ischemia & EF 62%  . Gastroesophageal reflux disease     Hiatal hernia  . Hyperlipidemia   . Deep vein thrombosis, upper right extremity     post-PPM  . Obesity   . DDD (degenerative disc disease)   . Dehiscence of closure of sternum or sternotomy 07/2009    sternal infection; required flap closure  . COPD (chronic obstructive pulmonary disease)     severe lung disease by PFTs 6/12  . Sleep apnea   . Seizure disorder   . 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 Pacemaker placement  1993    Status post DDD pacemaker implantation in 1993, with Medtronic Kappa generator change in 2003 for  second-degree AV block, most recent gen change by Fawn Kirk 04/14/11  . Revision total hip arthroplasty    . Replacement total knee      Left  . Thyroid surgery    . Wrist surgery    . 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 Reconstructive repair sternal      for infection s/p CABG 8/10  . Back surgery    . Esophagogastroduodenoscopy      with [facility] dilation,  biopsy, and disruption Schatzki's ring  . Colonoscopy  04/27/2005    hemorrhoids, diverticula  . Esophagogastroduodenoscopy  09/10/2012     RMR: Noncritical Schatzki's ring. Diffuse gastric erosions and an  area of partially healing ulceration-status post biopsy/ Small hiatal hernia. Bx reactive gastropathy.  . Abdominal hysterectomy    . Colonoscopy with esophagogastroduodenoscopy (egd) N/A 02/20/2013    RMR: pancolonic diverticulosis and internal hemorrhoids. EGD with small hiatal hernia, healed gastric ulcer  . Portacath placement Left 07/17/2013    Procedure: INSERTION PORT-A-CATH-left subclavian;  Surgeon: Fabio Bering, MD;  Location: [facility];  Service: General;  Laterality: Left;      Social History:  reports that she has never smoked. She has never used smokeless tobacco. She reports that she does not drink alcohol or use illicit drugs.    Allergies  Allergen Reactions  . Codeine     "good" headache  . Other     Leafy raw vegetables and seeds  . Oxycodone Hcl     "good" headache; also OxyContin  . Reglan [Metoclopramide]     CONFUSION    Family History  Problem Relation Age of Onset  . Diabetes    . Heart disease Mother     MI  . Coronary artery disease      Female <55  . Cancer    . Colon cancer Father     age 69     Prior to Admission medications   Medication Sig Start Date End Date Taking? Authorizing Provider  amLODipine (NORVASC) 10 MG tablet Take 10 mg by mouth daily. 11/21/11  Yes Dayna N Dunn, PA-C  aspirin EC 81 MG tablet Take 81 mg by mouth daily.   Yes Historical Provider, MD  atorvastatin (LIPITOR) 40 MG tablet Take 40 mg by mouth daily.   Yes Historical Provider, MD  benazepril (LOTENSIN) 40 MG tablet Take 1 tablet (40 mg total) by mouth daily. 07/03/13  Yes Fredirick Maudlin, MD  Bromfenac Sodium (BROMDAY) 0.09 % SOLN Place 1 drop into the right eye 3 (three) times daily.   Yes Historical Provider, MD  doxepin (SINEQUAN) 10 MG capsule Take 1 capsule (10 mg total) by mouth at bedtime. 07/18/13  Yes Fredirick Maudlin, MD  dronabinol (MARINOL) 2.5 MG capsule Take 1 capsule (2.5 mg total) by mouth  2 (two) times daily before lunch and supper. 07/18/13  Yes Fredirick Maudlin, MD  estradiol (ESTRACE) 0.5 MG tablet Take 0.5 mg by mouth daily.     Yes Historical Provider, MD  fentaNYL (DURAGESIC) 75 MCG/HR Place 1 patch (75 mcg total) onto the skin every 3 (three) days. 07/18/13  Yes Fredirick Maudlin, MD  hydrALAZINE (APRESOLINE) 25 MG tablet Take 25 mg by mouth every 8 (eight) hours. 11/21/11  Yes Dayna N Dunn, PA-C  ketorolac (ACULAR) 0.5 % ophthalmic solution Place 1 drop into the right eye 3 (three) times daily. 07/18/13  Yes Fredirick Maudlin, MD  levETIRAcetam (KEPPRA) 250 MG tablet Take 1 tablet (250 mg total) by mouth 2 (two) times daily. 07/03/13  Yes Fredirick Maudlin, MD  magnesium hydroxide (MILK OF MAGNESIA) 400 MG/5ML suspension Takes couple of tablespoons daily.   Yes Historical Provider, MD  metFORMIN (GLUCOPHAGE) 500 MG tablet Take 500 mg by mouth 2 (two) times daily with a meal.    Yes Historical Provider, MD  metoprolol tartrate (LOPRESSOR) 25 MG tablet Take 12.5 mg by mouth 2 (two) times daily.   Yes Historical Provider, MD  nitroGLYCERIN (NITROSTAT) 0.4 MG SL tablet Place 0.4 mg under the tongue every 5 (five) minutes as needed for chest pain.  11/21/11  Yes Dayna N Dunn, PA-C  ondansetron (ZOFRAN) 4 MG tablet Take 1 tablet (4 mg total) by mouth every 8 (eight) hours as needed. 07/18/13  Yes Fredirick Maudlin, MD  pantoprazole (PROTONIX) 40 MG tablet Take 40 mg by mouth 2 (two) times daily. 02/13/13  Yes Nira Retort, NP  potassium chloride (K-DUR) 10 MEQ tablet Take 2 tablets (20 mEq total) by mouth daily. 11/09/12  Yes Scott T Weaver, PA-C  senna-docusate (SENOKOT-S) 8.6-50 MG per tablet Take 1 tablet by mouth daily as needed for constipation.    Yes Historical Provider, MD  traMADol (ULTRAM) 50 MG tablet Take 50 mg by mouth every 6 (six) hours as needed for pain.   Yes Historical Provider, MD     Physical Exam: Filed Vitals:   07/22/13 1254 07/22/13 1358  BP: 169/67 155/80  Pulse:  63 65  Temp: 98.2 F (36.8 C)   TempSrc: Oral   Resp: 20 12  SpO2: 96% 97%     Constitutional: Vital signs reviewed. Patient is a well-developed and well-nourished in no acute distress and cooperative with exam. Alert and oriented x3.  Head: Normocephalic and atraumatic  Ear: TM normal bilaterally  Mouth: no erythema or exudates, MMM  Eyes: PERRL, EOMI, conjunctivae normal, No scleral icterus.  Scarring to sclera on right eye.  Neck: Normal range of motion. Neck supple. No JVD present.  No meningismus. No JVD.  Cardiovascular: Normal rate and regular rhythm.  Murmur heard. 2+ systolic murmur to upper sternum. Good cap refill. Difficult to appreciate pulse due to swelling.  Pulmonary/Chest: Effort normal. No respiratory distress.  Few crackles at bases bilaterally. No respiratory distress.  Abdominal: Soft. Bowel sounds are normal. There is tenderness. There is no rigidity, no rebound and no guarding.  Mild abdominal pain.  Musculoskeletal: Normal range of motion. She exhibits edema.  1+ pitting swelling on feet and legs bilaterally.  Neurological: She is alert and oriented to person, place, and time.  No obvious drift. Can barely lift legs but equal both sides. Has general weakness and needs assistance to sit up. Pt follows majority of commands and questions. Occasionally does not answer correctly (such as age or birthday). General slowness to response.  Skin: Skin is warm and dry.  No rash under panus.  Psychiatric: Normal mood and affect. speech and behavior is normal. Judgment and thought content normal. Cognition and memory are normal.       Labs on Admission:    Basic Metabolic Panel:  Recent Labs Lab 07/17/13 0929 07/22/13 1347  NA 140 136  K 3.1* 4.4  CL 108 102  CO2 22 23  GLUCOSE 107* 100*  BUN 7 13  CREATININE 1.04 1.30*  CALCIUM 8.7 9.8   Liver Function Tests:  Recent Labs Lab 07/22/13 1347  AST 16  ALT 8  ALKPHOS 79  BILITOT 0.4  PROT 7.1   ALBUMIN 3.4*   No results found for this basename: LIPASE, AMYLASE,  in the last 168 hours No results found for this basename: AMMONIA,  in the last 168 hours CBC:  Recent Labs Lab 07/16/13 0445 07/17/13 0929 07/22/13 1347  WBC 8.2 9.4 7.3  NEUTROABS  --   --  5.2  HGB 7.3* 9.3* 9.1*  HCT 22.4* 27.3* 28.3*  MCV 87.2 84.8 88.2  PLT 156 136* 182   Cardiac Enzymes:  Recent Labs Lab 07/22/13 1347  TROPONINI <0.30    BNP (last 3 results) No results found for this basename: PROBNP,  in the last 8760 hours    CBG:  Recent Labs Lab 07/17/13 1443 07/17/13 1654 07/17/13 1802 07/17/13 2049 07/18/13 0728  GLUCAP 91 81 86 116* 94    Radiological Exams on Admission: Dg Chest 2 View  07/22/2013   *RADIOLOGY REPORT*  Clinical Data: Altered mental status  CHEST - 2 VIEW  Comparison: 07/17/2013  Findings: 1409 hours.  Lung volumes are low. The cardiopericardial silhouette is enlarged.  The patient is status post CABG.  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  Left PICC line has been removed in the interval.  The left-sided Port-A-Cath remains in place with distal tip position overlying the proximal SVC.  IMPRESSION: Cardiomegaly with low lung volumes.  No acute cardiopulmonary findings.   Original Report Authenticated By: Kennith Center, M.D.   Ct Head Wo Contrast  07/22/2013   *RADIOLOGY REPORT*  Clinical Data: Increased confusion.  CT HEAD WITHOUT CONTRAST  Technique:  Contiguous axial images were obtained from the base of the skull through the vertex without contrast.  Comparison: Head CT 06/30/2013.  Findings: There is no evidence of acute intracranial abnormality including infarct, hemorrhage, mass lesion, mass effect, midline or abnormal extra axial fluid collection. Mild appearing chronic microvascular ischemic change is again seen. No hydrocephalus or pneumocephalus. Calvarium intact.  IMPRESSION: No acute finding.   Original Report Authenticated By: Holley Dexter, M.D.     EKG: Independently reviewed.    Assessment/Plan Active Problems:   * No active hospital problems. *   Altered mental status Repeat CT scan negative Unclear etiology History of seizure disorder Change to Keppra during last admission The check levels Cannot have an MRI because of pacemaker EEG? Could be secondary to narcotic medications? Will hold all sedating medications for now Patient noted to go in and out of a fib vs PAC's  May benefit from a holter monitor on DC    Anemia GI consultation during last admission. History of gastric ulcer/ Received blood transfusion during her last admission, hemoglobin better than baseline of about 8.0 Was Hemoccult negative during her last exam Follow closely    Code Status:   full Family Communication: bedside Disposition Plan: admit   Time spent: 70 mins   [facility] Pager [phone]  If 7PM-7AM, please contact night-coverage [email or web address] Password [facility] 07/22/2013, 3:54 PM

## 2013-07-22 NOTE — Progress Notes (Signed)
Patients port cath was leaking upon arrival to the floor so i stopped IV fluids with intentions on redressing and replacing cath needle. However, patient was just scratching at her chest and pulled port cath out herself, I will replace and redress at a later time during my shift. [name] RN

## 2013-07-22 NOTE — ED Provider Notes (Signed)
CSN: [id number]     Arrival date & time 07/22/13  1253 History  This chart was scribed for Enid Skeens, MD by Leone Payor, ED Scribe. This patient was seen in room APA02/APA02 and the patient's care was started 1:21 PM.    Chief Complaint  Patient presents with  . Altered Mental Status    The history is provided by the patient and a relative. The history is limited by the condition of the patient. No language interpreter was used.    HPI Comments: [name] is a 72 y.o. female who presents to the Emergency Department complaining of altered mental status starting yesterday. Pt was discharged from the hospital on 07/18/13 for confusion. Per daughter, they were told pt did not have a stroke. States her medication was changed and she was okay upon discharge. Pt also began to have urinary incontinence with one episode last night and 4 episodes today. Pt lives with her daughter and has [facility] come to check on her regularly. Daughter states pt also had 1 episode of emesis on 07/19/13 and is having increased bilateral leg swelling. She denies fever, chills, neck stiffness, cough, eye discharge, diarrhea. Pt has h/o CABG, DM, HTN. Denies recent surgeries.   Past Medical History  Diagnosis Date  . Diabetes mellitus, type II   . Hypertension   . Arteriosclerotic cardiovascular disease (ASCVD)     multivessel, CABG 6/10.  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  Myoview (11/20/11) showed no ischemia & EF 62%  . Gastroesophageal reflux disease     Hiatal hernia  . Hyperlipidemia   . Deep vein thrombosis, upper right extremity     post-PPM  . Obesity   . DDD (degenerative disc disease)   . Dehiscence of closure of sternum or sternotomy 07/2009    sternal infection; required flap closure  . COPD (chronic obstructive pulmonary disease)     severe lung disease by PFTs 6/12  . Sleep apnea   . Seizure disorder   . 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 Pacemaker placement  1993    Status post DDD pacemaker implantation in 1993, with Medtronic Kappa generator change in 2003 for  second-degree AV block, most recent gen change by Fawn Kirk 04/14/11  . Revision total hip arthroplasty    . Replacement total knee      Left  . Thyroid surgery    . Wrist surgery    . 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 Reconstructive repair sternal      for infection s/p CABG 8/10  . Back surgery    . Esophagogastroduodenoscopy      with [facility] dilation,  biopsy, and disruption Schatzki's ring  . Colonoscopy  04/27/2005    hemorrhoids, diverticula  . Esophagogastroduodenoscopy  09/10/2012    RMR: Noncritical Schatzki's ring. Diffuse gastric erosions and an  area of partially healing ulceration-status post biopsy/ Small hiatal hernia. Bx reactive gastropathy.  . Abdominal hysterectomy    . Colonoscopy with esophagogastroduodenoscopy (egd) N/A 02/20/2013    RMR: pancolonic diverticulosis and internal hemorrhoids. EGD with small hiatal hernia, healed gastric ulcer  . Portacath placement Left 07/17/2013    Procedure: INSERTION PORT-A-CATH-left subclavian;  Surgeon: Fabio Bering, MD;  Location: [facility];  Service: General;  Laterality: Left;   Family History  Problem Relation Age of Onset  . Diabetes    . Heart disease Mother     MI  . Coronary artery disease      Female <55  . Cancer    . Colon cancer Father     age 14   History  Substance Use Topics  . Smoking status: Never Smoker   . Smokeless tobacco: Never Used  . Alcohol Use: No   OB History   Grav Para Term Preterm Abortions TAB SAB Ect Mult Living                 Review of Systems  Constitutional: Negative for fever and chills.  HENT: Negative for neck stiffness.   Eyes: Negative for discharge.  Respiratory: Negative for cough.   Cardiovascular: Negative for chest pain.  Gastrointestinal:  Negative for diarrhea.  Genitourinary: Negative for dysuria.  Musculoskeletal: Negative for back pain.  Skin: Negative for rash.  Neurological: Positive for weakness. Negative for light-headedness.  Psychiatric/Behavioral: Positive for confusion.  All other systems reviewed and are negative.    Allergies  Codeine; Other; Oxycodone hcl; and Reglan  Home Medications   Current Outpatient Rx  Name  Route  Sig  Dispense  Refill  . amLODipine (NORVASC) 10 MG tablet   Oral   Take 10 mg by mouth daily.         Marland Kitchen aspirin EC 81 MG tablet   Oral   Take 81 mg by mouth daily.         Marland Kitchen atorvastatin (LIPITOR) 40 MG tablet   Oral   Take 40 mg by mouth daily.         . benazepril (LOTENSIN) 40 MG tablet   Oral   Take 1 tablet (40 mg total) by mouth daily.   30 tablet   12   . Bromfenac Sodium (BROMDAY) 0.09 % SOLN   Right Eye   Place 1 drop into the right eye 3 (three) times daily.         Marland Kitchen doxepin (SINEQUAN) 10 MG capsule   Oral   Take 1 capsule (10 mg total) by mouth at bedtime.   30 capsule   12   . dronabinol (MARINOL) 2.5 MG capsule   Oral   Take 1 capsule (2.5 mg total) by mouth 2 (two) times daily before lunch and supper.   60 capsule   1   . estradiol (ESTRACE) 0.5 MG tablet   Oral   Take 0.5 mg by mouth daily.           . fentaNYL (DURAGESIC) 75 MCG/HR   Transdermal   Place 1 patch (75 mcg total) onto the skin every 3 (three) days.   5 patch   0   . hydrALAZINE (APRESOLINE) 25 MG tablet   Oral   Take 25 mg by mouth every 8 (eight) hours.         Marland Kitchen ketorolac (ACULAR) 0.5 % ophthalmic solution   Right Eye   Place 1 drop into the right eye 3 (three) times daily.   5 mL   0   . levETIRAcetam (KEPPRA) 250 MG tablet   Oral   Take 1 tablet (250 mg total) by mouth 2 (two) times daily.   60 tablet   12   . magnesium hydroxide (MILK OF MAGNESIA) 400 MG/5ML suspension      Takes couple of tablespoons daily.         . metFORMIN (GLUCOPHAGE)  500 MG tablet  Oral   Take 500 mg by mouth 2 (two) times daily with a meal.          . metoprolol tartrate (LOPRESSOR) 25 MG tablet   Oral   Take 12.5 mg by mouth 2 (two) times daily.         . nitroGLYCERIN (NITROSTAT) 0.4 MG SL tablet   Sublingual   Place 0.4 mg under the tongue every 5 (five) minutes as needed for chest pain.          Marland Kitchen ondansetron (ZOFRAN) 4 MG tablet   Oral   Take 1 tablet (4 mg total) by mouth every 8 (eight) hours as needed.   20 tablet   0   . pantoprazole (PROTONIX) 40 MG tablet   Oral   Take 40 mg by mouth 2 (two) times daily.         . potassium chloride (K-DUR) 10 MEQ tablet   Oral   Take 2 tablets (20 mEq total) by mouth daily.   60 tablet   3   . senna-docusate (SENOKOT-S) 8.6-50 MG per tablet   Oral   Take 1 tablet by mouth daily as needed for constipation.          . traMADol (ULTRAM) 50 MG tablet   Oral   Take 50 mg by mouth every 6 (six) hours as needed for pain.          BP 169/67  Pulse 63  Temp(Src) 98.2 F (36.8 C) (Oral)  Resp 20  SpO2 96% Physical Exam  Nursing note and vitals reviewed. Constitutional: She is oriented to person, place, and time. She appears well-developed and well-nourished.  HENT:  Head: Normocephalic and atraumatic.  Mouth/Throat: Mucous membranes are dry.  Dry mm  Eyes: Conjunctivae and EOM are normal. Pupils are equal, round, and reactive to light.  Scarring to sclera on right eye.   Neck: Normal range of motion. Neck supple. No JVD present.  No meningismus. No JVD.  Cardiovascular: Normal rate and regular rhythm.   Murmur heard. 2+ systolic murmur to upper sternum. Good cap refill. Difficult to appreciate pulse due to swelling.   Pulmonary/Chest: Effort normal. No respiratory distress.  Few crackles at bases bilaterally. No respiratory distress.   Abdominal: Soft. Bowel sounds are normal. There is tenderness. There is no rigidity, no rebound and no guarding.  Mild abdominal pain.    Musculoskeletal: Normal range of motion. She exhibits edema.  1+ pitting swelling on feet and legs bilaterally.   Neurological: She is alert and oriented to person, place, and time.  No obvious drift. Can barely lift legs but equal both sides. Has general weakness and needs assistance to sit up. Pt follows majority of commands and questions. Occasionally does not answer correctly (such as age or birthday). General slowness to response.   Skin: Skin is warm and dry.  No rash under panus.   Psychiatric: She has a normal mood and affect.    ED Course   Procedures (including critical care time)  DIAGNOSTIC STUDIES: Oxygen Saturation is 96% on RA, adequate by my interpretation.    COORDINATION OF CARE: 1:21 PM Discussed treatment plan with pt at bedside and pt agreed to plan.   Labs Reviewed  CBC WITH DIFFERENTIAL - Abnormal; Notable for the following:    RBC 3.21 (*)    Hemoglobin 9.1 (*)    HCT 28.3 (*)    All other components within normal limits  COMPREHENSIVE METABOLIC PANEL - Abnormal; Notable for  the following:    Glucose, Bld 100 (*)    Creatinine, Ser 1.30 (*)    Albumin 3.4 (*)    GFR calc non Af Amer 39 (*)    GFR calc Af Amer 46 (*)    All other components within normal limits  URINE CULTURE  TROPONIN I  LACTIC ACID, PLASMA  URINALYSIS, ROUTINE W REFLEX MICROSCOPIC   Dg Chest 2 View  07/22/2013   *RADIOLOGY REPORT*  Clinical Data: Altered mental status  CHEST - 2 VIEW  Comparison: 07/17/2013  Findings: 1409 hours.  Lung volumes are low. The cardiopericardial silhouette is enlarged.  The patient is status post CABG.  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  Left PICC line has been removed in the interval.  The left-sided Port-A-Cath remains in place with distal tip position overlying the proximal SVC.  IMPRESSION: Cardiomegaly with low lung volumes.  No acute cardiopulmonary findings.   Original Report Authenticated By: Kennith Center, M.D.   Ct Head Wo  Contrast  07/22/2013   *RADIOLOGY REPORT*  Clinical Data: Increased confusion.  CT HEAD WITHOUT CONTRAST  Technique:  Contiguous axial images were obtained from the base of the skull through the vertex without contrast.  Comparison: Head CT 06/30/2013.  Findings: There is no evidence of acute intracranial abnormality including infarct, hemorrhage, mass lesion, mass effect, midline or abnormal extra axial fluid collection. Mild appearing chronic microvascular ischemic change is again seen. No hydrocephalus or pneumocephalus. Calvarium intact.  IMPRESSION: No acute finding.   Original Report Authenticated By: Holley Dexter, M.D.   No diagnosis found.  MDM  I personally performed the services described in this documentation, which was scribed in my presence. The recorded information has been reviewed and is accurate.  AMS similar to previous.  Discussed broad differential metabolic/ dehydration, medicine SE, stroke, UTI, other. Blood work, CXR, UA and ekg. Recheck no acute changes.  Xray and ekg reviewed.   Date: 07/22/2013  Rate: 63  Rhythm: normal sinus rhythm  QRS Axis: normal  Intervals: normal  ST/T Wave abnormalities: normal  Conduction Disutrbances:none  Narrative Interpretation:   Old EKG Reviewed: unchanged  Plan for admission to hospital, patient very weak and confused.  No signs of meningitis.   Mild improvement on recheck, pt still gen weakness. Concenr for medicine SE/ dehydration. Discussed with Dr Susie Cassette, accepted admission. Updated family.    Enid Skeens, MD 07/22/13 [phone]

## 2013-07-22 NOTE — ED Notes (Signed)
Old bloody steri strips removed from port a cath. Incision site slightly red, no swelling or drainage noted

## 2013-07-22 NOTE — ED Notes (Signed)
Pt placed on bedpan per request.

## 2013-07-22 NOTE — ED Notes (Signed)
EDP in to eval 

## 2013-07-22 NOTE — ED Notes (Signed)
Daughter states pt was discharged from the hospital this past Thursday. States she was admitted for weakness. Pt had a port a cath inserted on 07/17/13 due to poor vein access. Daughter states pt was OK Friday but late Saturday evening she became confused and was incontinent of urine. Pt has edema to hands and feet. Keeps head turned to the right. Is able to answer questions

## 2013-07-22 NOTE — ED Notes (Signed)
Daughter states that the patient was just discharged from the hospital on Thursday.  States that she has had increasing confusion since yesterday with urinary incontinence.

## 2013-07-22 NOTE — ED Notes (Signed)
Pt repositioned. States she does not need anything for pain or nausea. Alert and oriented. Skin warm and dry

## 2013-07-22 NOTE — ED Notes (Signed)
Pt being eval by hospitalist

## 2013-07-23 ENCOUNTER — Other Ambulatory Visit (HOSPITAL_COMMUNITY): Payer: PRIVATE HEALTH INSURANCE

## 2013-07-23 ENCOUNTER — Encounter (HOSPITAL_COMMUNITY): Payer: Self-pay | Admitting: *Deleted

## 2013-07-23 LAB — COMPREHENSIVE METABOLIC PANEL
ALT: 9 U/L (ref 0–35)
BUN: 11 mg/dL (ref 6–23)
CO2: 25 mEq/L (ref 19–32)
Calcium: 9.8 mg/dL (ref 8.4–10.5)
Creatinine, Ser: 1.06 mg/dL (ref 0.50–1.10)
GFR calc Af Amer: 58 mL/min — ABNORMAL LOW (ref >90)
GFR calc non Af Amer: 50 mL/min — ABNORMAL LOW (ref >90)
Glucose, Bld: 107 mg/dL — ABNORMAL HIGH (ref 70–99)
Sodium: 137 mEq/L (ref 135–145)

## 2013-07-23 LAB — CBC
Hemoglobin: 9.5 g/dL — ABNORMAL LOW (ref 12.0–15.0)
MCHC: 33.3 g/dL (ref 30.0–36.0)

## 2013-07-23 LAB — TROPONIN I: Troponin I: <0.3 ng/mL (ref <0.30)

## 2013-07-23 MED ORDER — LEVETIRACETAM 500 MG PO TABS
500.0000 mg | ORAL_TABLET | Freq: Two times a day (BID) | ORAL | Status: DC
  Administered 2013-07-23 – 2013-07-26 (×7): 500 mg via ORAL
  Filled 2013-07-23 (×7): qty 1

## 2013-07-23 NOTE — Progress Notes (Signed)
Subjective: She was admitted again with acute encephalopathy. At last hospitalization I had reduced her fentanyl patch stopped Phenergan told her family to give her tramadol only if absolutely necessary because I thought it was probably a combination of medications at home that caused her problem. However she became incontinent and developed delirium again. She did not have definite seizure.  Objective: Vital signs in last 24 hours: Temp:  [98.2 F (36.8 C)-98.6 F (37 C)] 98.6 F (37 C) (08/19 0509) Pulse Rate:  [60-75] 70 (08/19 0509) Resp:  [8-20] 16 (08/19 0509) BP: (132-192)/(55-117) 150/55 mmHg (08/19 0509) SpO2:  [94 %-100 %] 100 % (08/19 0509) Weight change:     Intake/Output from previous day: 08/18 0701 - 08/19 0700 In: 240 [P.O.:240] Out: 13 [Urine:13]  PHYSICAL EXAM General appearance: alert, cooperative and no distress Resp: clear to auscultation bilaterally Cardio: regular rate and rhythm, S1, S2 normal, no murmur, click, rub or gallop GI: soft, non-tender; bowel sounds normal; no masses,  no organomegaly Extremities: extremities normal, atraumatic, no cyanosis or edema  Lab Results:    Basic Metabolic Panel:  Recent Labs  62/95/28 1347 07/23/13 0247  NA 136 137  K 4.4 4.1  CL 102 100  CO2 23 25  GLUCOSE 100* 107*  BUN 13 11  CREATININE 1.30* 1.06  CALCIUM 9.8 9.8   Liver Function Tests:  Recent Labs  07/22/13 1347 07/23/13 0247  AST 16 18  ALT 8 9  ALKPHOS 79 82  BILITOT 0.4 0.4  PROT 7.1 7.0  ALBUMIN 3.4* 3.4*   No results found for this basename: LIPASE, AMYLASE,  in the last 72 hours No results found for this basename: AMMONIA,  in the last 72 hours CBC:  Recent Labs  07/22/13 1347 07/23/13 0247  WBC 7.3 7.6  NEUTROABS 5.2  --   HGB 9.1* 9.5*  HCT 28.3* 28.5*  MCV 88.2 86.6  PLT 182 181   Cardiac Enzymes:  Recent Labs  07/22/13 1347 07/22/13 2004 07/23/13 0247  TROPONINI <0.30 <0.30 <0.30   BNP: No results found  for this basename: PROBNP,  in the last 72 hours D-Dimer: No results found for this basename: DDIMER,  in the last 72 hours CBG: No results found for this basename: GLUCAP,  in the last 72 hours Hemoglobin A1C: No results found for this basename: HGBA1C,  in the last 72 hours Fasting Lipid Panel: No results found for this basename: CHOL, HDL, LDLCALC, TRIG, CHOLHDL, LDLDIRECT,  in the last 72 hours Thyroid Function Tests: No results found for this basename: TSH, T4TOTAL, FREET4, T3FREE, THYROIDAB,  in the last 72 hours Anemia Panel: No results found for this basename: VITAMINB12, FOLATE, FERRITIN, TIBC, IRON, RETICCTPCT,  in the last 72 hours Coagulation: No results found for this basename: LABPROT, INR,  in the last 72 hours Urine Drug Screen: Drugs of Abuse  No results found for this basename: labopia, cocainscrnur, labbenz, amphetmu, thcu, labbarb    Alcohol Level: No results found for this basename: ETH,  in the last 72 hours Urinalysis:  Recent Labs  07/22/13 1352  COLORURINE YELLOW  LABSPEC 1.025  PHURINE 5.5  GLUCOSEU NEGATIVE  HGBUR NEGATIVE  BILIRUBINUR NEGATIVE  KETONESUR NEGATIVE  PROTEINUR NEGATIVE  UROBILINOGEN 0.2  NITRITE NEGATIVE  LEUKOCYTESUR NEGATIVE   Misc. Labs:  ABGS No results found for this basename: PHART, PCO2, PO2ART, TCO2, HCO3,  in the last 72 hours CULTURES Recent Results (from the past 240 hour(s))  MRSA PCR SCREENING  Status: None   Collection Time    07/16/13  4:45 PM      Result Value Range Status   MRSA by PCR NEGATIVE  NEGATIVE Final   Comment:            The GeneXpert MRSA Assay (FDA     approved for NASAL specimens     only), is one component of a     comprehensive MRSA colonization     surveillance program. It is not     intended to diagnose MRSA     infection nor to guide or     monitor treatment for     MRSA infections.   Studies/Results: Dg Chest 2 View  07/22/2013   *RADIOLOGY REPORT*  Clinical Data: Altered  mental status  CHEST - 2 VIEW  Comparison: 07/17/2013  Findings: 1409 hours.  Lung volumes are low. The cardiopericardial silhouette is enlarged.  The patient is status post CABG.  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  Left PICC line has been removed in the interval.  The left-sided Port-A-Cath remains in place with distal tip position overlying the proximal SVC.  IMPRESSION: Cardiomegaly with low lung volumes.  No acute cardiopulmonary findings.   Original Report Authenticated By: Kennith Center, M.D.   Ct Head Wo Contrast  07/22/2013   *RADIOLOGY REPORT*  Clinical Data: Increased confusion.  CT HEAD WITHOUT CONTRAST  Technique:  Contiguous axial images were obtained from the base of the skull through the vertex without contrast.  Comparison: Head CT 06/30/2013.  Findings: There is no evidence of acute intracranial abnormality including infarct, hemorrhage, mass lesion, mass effect, midline or abnormal extra axial fluid collection. Mild appearing chronic microvascular ischemic change is again seen. No hydrocephalus or pneumocephalus. Calvarium intact.  IMPRESSION: No acute finding.   Original Report Authenticated By: Holley Dexter, M.D.    Medications:  Prior to Admission:  Prescriptions prior to admission  Medication Sig Dispense Refill  . amLODipine (NORVASC) 10 MG tablet Take 10 mg by mouth daily.      Marland Kitchen aspirin EC 81 MG tablet Take 81 mg by mouth daily.      Marland Kitchen atorvastatin (LIPITOR) 40 MG tablet Take 40 mg by mouth daily.      . benazepril (LOTENSIN) 40 MG tablet Take 1 tablet (40 mg total) by mouth daily.  30 tablet  12  . Bromfenac Sodium (BROMDAY) 0.09 % SOLN Place 1 drop into the right eye 3 (three) times daily.      Marland Kitchen doxepin (SINEQUAN) 10 MG capsule Take 1 capsule (10 mg total) by mouth at bedtime.  30 capsule  12  . dronabinol (MARINOL) 2.5 MG capsule Take 1 capsule (2.5 mg total) by mouth 2 (two) times daily before lunch and supper.  60 capsule  1  . estradiol (ESTRACE) 0.5 MG  tablet Take 0.5 mg by mouth daily.        . fentaNYL (DURAGESIC) 75 MCG/HR Place 1 patch (75 mcg total) onto the skin every 3 (three) days.  5 patch  0  . hydrALAZINE (APRESOLINE) 25 MG tablet Take 25 mg by mouth every 8 (eight) hours.      Marland Kitchen ketorolac (ACULAR) 0.5 % ophthalmic solution Place 1 drop into the right eye 3 (three) times daily.  5 mL  0  . levETIRAcetam (KEPPRA) 250 MG tablet Take 1 tablet (250 mg total) by mouth 2 (two) times daily.  60 tablet  12  . magnesium hydroxide (MILK OF MAGNESIA) 400 MG/5ML suspension  Takes couple of tablespoons daily.      . metFORMIN (GLUCOPHAGE) 500 MG tablet Take 500 mg by mouth 2 (two) times daily with a meal.       . metoprolol tartrate (LOPRESSOR) 25 MG tablet Take 12.5 mg by mouth 2 (two) times daily.      . nitroGLYCERIN (NITROSTAT) 0.4 MG SL tablet Place 0.4 mg under the tongue every 5 (five) minutes as needed for chest pain.       Marland Kitchen ondansetron (ZOFRAN) 4 MG tablet Take 1 tablet (4 mg total) by mouth every 8 (eight) hours as needed.  20 tablet  0  . pantoprazole (PROTONIX) 40 MG tablet Take 40 mg by mouth 2 (two) times daily.      . potassium chloride (K-DUR) 10 MEQ tablet Take 2 tablets (20 mEq total) by mouth daily.  60 tablet  3  . senna-docusate (SENOKOT-S) 8.6-50 MG per tablet Take 1 tablet by mouth daily as needed for constipation.       . traMADol (ULTRAM) 50 MG tablet Take 50 mg by mouth every 6 (six) hours as needed for pain.       Scheduled: . amLODipine  10 mg Oral Daily  . aspirin EC  81 mg Oral Daily  . atorvastatin  40 mg Oral q1800  . dronabinol  2.5 mg Oral BID AC  . enoxaparin (LOVENOX) injection  40 mg Subcutaneous Q24H  . hydrALAZINE  25 mg Oral Q8H  . ketorolac  1 drop Right Eye TID  . levETIRAcetam  250 mg Oral BID  . metoprolol tartrate  12.5 mg Oral BID  . pantoprazole  40 mg Oral BID  . potassium chloride  20 mEq Oral Daily  . sodium chloride  3 mL Intravenous Q12H   Continuous: . sodium chloride      MVH:[id number], acetaminophen, levalbuterol, ondansetron (ZOFRAN) IV, ondansetron  Assesment: She is delirious again. I think this still is probably from combination of medications at home but reduction from a number of her medications and several of them were stopped. She has diabetes which is stable. She has a seizure disorder but does not have written definite seizures. She is back to baseline now. Active Problems:   Diabetes mellitus, type II   Hypertension   Hyperlipidemia   Acute delirium    Plan: For neurology consultation    LOS: 1 day   Cainen Burnham L 07/23/2013, 9:02 AM

## 2013-07-23 NOTE — Progress Notes (Signed)
Pt accidentally pulled port line out. Reacessessed port and received blood return. Pt tolerated procedure well. Will continue to monitor.

## 2013-07-23 NOTE — Consult Note (Addendum)
[facility] [name] A. Christine Pilgrim, MD     [email or web address]          [name] is an 72 y.o. female.   ASSESSMENT/PLAN: 1. Recurrent spells of encephalopathy and altered mentation of unclear etiology. Potential differential diagnosis includes complex partial seizures, unwitnessed generalized seizure, medication effect and toxic metabolic etiologies. I suggest the following adjustments in her medications: I think we should discontinue the fentanyl patch as in my experience older people do not do well with long-acting medications,  I will also discontinue the Ultram as this has been associated with seizures especially given that the patient has a baseline history of seizures. The dose of Keppra will be increased. We may also consider discontinue the doxepin at a later date. An EEG obtained.    The patient is 72 year old black female who has been admitted to the hospital because of recurrent spells of encephalopathy and altered mental status. She was seen last month in the hospital by myself because of one of these spells. It appears that she did have some automatic movement/automatism suggestive of complex partial seizures. There is a slight history of seizures. Her Dilantin level was subtherapeutic. She was switched to Keppra. Workup including EEG which showed generalized slowing but no epileptiform activity. It appears that she continues to have episodes of confusion and altered mental status. She did have some adjustments in her medication previously with reduction of her fentanyl from 100 g to 75 g. Patient tends to recover spontaneously after these spells suggestive of seizures. The patient does not report any complaints at this time. B system is always unremarkable although limited due to the confusion.  GENERAL: This is an obese pleasant lady who is in no acute distress. She is sitting on the side of the bed trying to get out of the bed to use the restroom and is actually stuck.  She was repositioned.  HEENT: Neck is supple. She has a large tongue. Posterior air space is crowded though not reddened.  ABDOMEN: soft  EXTREMITIES: No edema. Significant arthritic changes of the knees bilaterally. Status post bilateral knee total arthroplasty incisional scars.  BACK: Unremarkable.  SKIN: Normal by inspection.    MENTAL STATUS: She is awake and alert. She speaks in full clear sentences. She knows that she is in the hospital although she cannot name the hospital. She does not know why she is in the hospital. There is no dysarthria.  CRANIAL NERVES: Pupils are equal, round and reactive to light; extra ocular movements are full, there is no significant nystagmus; visual fields are full; upper and lower facial muscles are normal in strength and symmetric, there is no flattening of the nasolabial folds; tongue is midline; uvula is midline; shoulder elevation is normal.  MOTOR: Normal tone, bulk and strength; no pronator drift.  COORDINATION: Left finger to nose is normal, right finger to nose is normal, No rest tremor; no intention tremor; no postural tremor; no bradykinesia.  REFLEXES: Deep tendon reflexes are symmetrical and normal. Plantar responses are flexor bilaterally.   SENSATION: Normal to light touch.   Past Medical History  Diagnosis Date  . Diabetes mellitus, type II   . Hypertension   . Arteriosclerotic cardiovascular disease (ASCVD)     multivessel, CABG 6/10.  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  Myoview (11/20/11) showed no ischemia & EF 62%  . Gastroesophageal reflux disease     Hiatal hernia  . Hyperlipidemia   . Deep vein thrombosis, upper right extremity     post-PPM  .  Obesity   . DDD (degenerative disc disease)   . Dehiscence of closure of sternum or sternotomy 07/2009    sternal infection; required flap closure  . COPD (chronic obstructive pulmonary disease)     severe lung disease by PFTs 6/12  . Sleep apnea   . Seizure disorder   . 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 Pacemaker placement  1993    Status post DDD pacemaker implantation in 1993, with Medtronic Kappa generator change in 2003 for  second-degree AV block, most recent gen change by Fawn Kirk 04/14/11  . Revision total hip arthroplasty    . Replacement total knee      Left  . Thyroid surgery    . Wrist surgery    . 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 Reconstructive repair sternal      for infection s/p CABG 8/10  . Back surgery    . Esophagogastroduodenoscopy      with [facility] dilation,  biopsy, and disruption Schatzki's ring  . Colonoscopy  04/27/2005    hemorrhoids, diverticula  . Esophagogastroduodenoscopy  09/10/2012    RMR: Noncritical Schatzki's ring. Diffuse gastric erosions and an  area of partially healing ulceration-status post biopsy/ Small hiatal hernia. Bx reactive gastropathy.  . Abdominal hysterectomy    . Colonoscopy with esophagogastroduodenoscopy (egd) N/A 02/20/2013    RMR: pancolonic diverticulosis and internal hemorrhoids. EGD with small hiatal hernia, healed gastric ulcer  . Portacath placement Left 07/17/2013    Procedure: INSERTION PORT-A-CATH-left subclavian;  Surgeon: Fabio Bering, MD;  Location: [facility];  Service: General;  Laterality: Left;    Family History  Problem Relation Age of Onset  . Diabetes    . Heart disease Mother     MI  . Coronary artery disease      Female <55  . Cancer    . Colon cancer Father     age 56    Social History:  reports that she has never smoked. She has never used smokeless tobacco. She reports that she does not drink alcohol or use illicit drugs.  Allergies:  Allergies  Allergen Reactions  . Codeine     "good" headache  . Other     Leafy raw vegetables and seeds  . Oxycodone Hcl     "good" headache; also OxyContin  . Reglan [Metoclopramide]     CONFUSION     Medications:  Prior to Admission medications   Medication Sig Start Date End Date Taking? Authorizing Provider  amLODipine (NORVASC) 10 MG tablet Take 10 mg by mouth daily. 11/21/11  Yes Dayna N Dunn, PA-C  aspirin EC 81 MG tablet Take 81 mg by mouth daily.   Yes Historical Provider, MD  atorvastatin (LIPITOR) 40 MG tablet Take 40 mg by mouth daily.   Yes Historical Provider, MD  benazepril (LOTENSIN) 40 MG tablet Take 1 tablet (40 mg total) by mouth daily. 07/03/13  Yes Fredirick Maudlin, MD  Bromfenac Sodium (BROMDAY) 0.09 % SOLN Place 1 drop into the right eye 3 (three) times daily.   Yes Historical Provider, MD  doxepin (SINEQUAN) 10 MG capsule Take 1 capsule (10 mg total) by mouth at bedtime. 07/18/13  Yes Fredirick Maudlin, MD  dronabinol (MARINOL) 2.5 MG capsule Take 1 capsule (2.5 mg total) by  mouth 2 (two) times daily before lunch and supper. 07/18/13  Yes Fredirick Maudlin, MD  estradiol (ESTRACE) 0.5 MG tablet Take 0.5 mg by mouth daily.     Yes Historical Provider, MD  fentaNYL (DURAGESIC) 75 MCG/HR Place 1 patch (75 mcg total) onto the skin every 3 (three) days. 07/18/13  Yes Fredirick Maudlin, MD  hydrALAZINE (APRESOLINE) 25 MG tablet Take 25 mg by mouth every 8 (eight) hours. 11/21/11  Yes Dayna N Dunn, PA-C  ketorolac (ACULAR) 0.5 % ophthalmic solution Place 1 drop into the right eye 3 (three) times daily. 07/18/13  Yes Fredirick Maudlin, MD  levETIRAcetam (KEPPRA) 250 MG tablet Take 1 tablet (250 mg total) by mouth 2 (two) times daily. 07/03/13  Yes Fredirick Maudlin, MD  magnesium hydroxide (MILK OF MAGNESIA) 400 MG/5ML suspension Takes couple of tablespoons daily.   Yes Historical Provider, MD  metFORMIN (GLUCOPHAGE) 500 MG tablet Take 500 mg by mouth 2 (two) times daily with a meal.    Yes Historical Provider, MD  metoprolol tartrate (LOPRESSOR) 25 MG tablet Take 12.5 mg by mouth 2 (two) times daily.   Yes Historical Provider, MD  nitroGLYCERIN (NITROSTAT) 0.4 MG SL tablet Place 0.4  mg under the tongue every 5 (five) minutes as needed for chest pain.  11/21/11  Yes Dayna N Dunn, PA-C  ondansetron (ZOFRAN) 4 MG tablet Take 1 tablet (4 mg total) by mouth every 8 (eight) hours as needed. 07/18/13  Yes Fredirick Maudlin, MD  pantoprazole (PROTONIX) 40 MG tablet Take 40 mg by mouth 2 (two) times daily. 02/13/13  Yes Nira Retort, NP  potassium chloride (K-DUR) 10 MEQ tablet Take 2 tablets (20 mEq total) by mouth daily. 11/09/12  Yes Scott T Weaver, PA-C  senna-docusate (SENOKOT-S) 8.6-50 MG per tablet Take 1 tablet by mouth daily as needed for constipation.    Yes Historical Provider, MD  traMADol (ULTRAM) 50 MG tablet Take 50 mg by mouth every 6 (six) hours as needed for pain.   Yes Historical Provider, MD    Scheduled Meds: . amLODipine  10 mg Oral Daily  . aspirin EC  81 mg Oral Daily  . atorvastatin  40 mg Oral q1800  . dronabinol  2.5 mg Oral BID AC  . enoxaparin (LOVENOX) injection  40 mg Subcutaneous Q24H  . hydrALAZINE  25 mg Oral Q8H  . ketorolac  1 drop Right Eye TID  . levETIRAcetam  250 mg Oral BID  . metoprolol tartrate  12.5 mg Oral BID  . pantoprazole  40 mg Oral BID  . potassium chloride  20 mEq Oral Daily  . sodium chloride  3 mL Intravenous Q12H   Continuous Infusions: . sodium chloride     PRN Meds:.acetaminophen, acetaminophen, levalbuterol, ondansetron (ZOFRAN) IV, ondansetron   Blood pressure 150/55, pulse 70, temperature 98.6 F (37 C), temperature source Oral, resp. rate 16, SpO2 100.00%.   Results for orders placed during the hospital encounter of 07/22/13 (from the past 48 hour(s))  CBC WITH DIFFERENTIAL     Status: Abnormal   Collection Time    07/22/13  1:47 PM      Result Value Range   WBC 7.3  4.0 - 10.5 K/uL   RBC 3.21 (*) 3.87 - 5.11 MIL/uL   Hemoglobin 9.1 (*) 12.0 - 15.0 g/dL   HCT 54.0 (*) 98.1 - 19.1 %   MCV 88.2  78.0 - 100.0 fL   MCH 28.3  26.0 - 34.0 pg  MCHC 32.2  30.0 - 36.0 g/dL   RDW 57.8  46.9 - 62.9 %   Platelets  182  150 - 400 K/uL   Neutrophils Relative % 71  43 - 77 %   Neutro Abs 5.2  1.7 - 7.7 K/uL   Lymphocytes Relative 16  12 - 46 %   Lymphs Abs 1.2  0.7 - 4.0 K/uL   Monocytes Relative 9  3 - 12 %   Monocytes Absolute 0.7  0.1 - 1.0 K/uL   Eosinophils Relative 4  0 - 5 %   Eosinophils Absolute 0.3  0.0 - 0.7 K/uL   Basophils Relative 0  0 - 1 %   Basophils Absolute 0.0  0.0 - 0.1 K/uL  COMPREHENSIVE METABOLIC PANEL     Status: Abnormal   Collection Time    07/22/13  1:47 PM      Result Value Range   Sodium 136  135 - 145 mEq/L   Potassium 4.4  3.5 - 5.1 mEq/L   Chloride 102  96 - 112 mEq/L   CO2 23  19 - 32 mEq/L   Glucose, Bld 100 (*) 70 - 99 mg/dL   BUN 13  6 - 23 mg/dL   Creatinine, Ser 5.28 (*) 0.50 - 1.10 mg/dL   Calcium 9.8  8.4 - 41.3 mg/dL   Total Protein 7.1  6.0 - 8.3 g/dL   Albumin 3.4 (*) 3.5 - 5.2 g/dL   AST 16  0 - 37 U/L   ALT 8  0 - 35 U/L   Alkaline Phosphatase 79  39 - 117 U/L   Total Bilirubin 0.4  0.3 - 1.2 mg/dL   GFR calc non Af Amer 39 (*) >90 mL/min   GFR calc Af Amer 46 (*) >90 mL/min   Comment: (NOTE)     The eGFR has been calculated using the CKD EPI equation.     This calculation has not been validated in all clinical situations.     eGFR's persistently <90 mL/min signify possible Chronic Kidney     Disease.  TROPONIN I     Status: None   Collection Time    07/22/13  1:47 PM      Result Value Range   Troponin I <0.30  <0.30 ng/mL   Comment:            Due to the release kinetics of cTnI,     a negative result within the first hours     of the onset of symptoms does not rule out     myocardial infarction with certainty.     If myocardial infarction is still suspected,     repeat the test at appropriate intervals.  URINALYSIS, ROUTINE W REFLEX MICROSCOPIC     Status: None   Collection Time    07/22/13  1:52 PM      Result Value Range   Color, Urine YELLOW  YELLOW   APPearance CLEAR  CLEAR   Specific Gravity, Urine 1.025  1.005 - 1.030    pH 5.5  5.0 - 8.0   Glucose, UA NEGATIVE  NEGATIVE mg/dL   Hgb urine dipstick NEGATIVE  NEGATIVE   Bilirubin Urine NEGATIVE  NEGATIVE   Ketones, ur NEGATIVE  NEGATIVE mg/dL   Protein, ur NEGATIVE  NEGATIVE mg/dL   Urobilinogen, UA 0.2  0.0 - 1.0 mg/dL   Nitrite NEGATIVE  NEGATIVE   Leukocytes, UA NEGATIVE  NEGATIVE   Comment: MICROSCOPIC NOT DONE ON URINES WITH  NEGATIVE PROTEIN, BLOOD, LEUKOCYTES, NITRITE, OR GLUCOSE <1000 mg/dL.  LACTIC ACID, PLASMA     Status: None   Collection Time    07/22/13  2:11 PM      Result Value Range   Lactic Acid, Venous 1.0  0.5 - 2.2 mmol/L  TROPONIN I     Status: None   Collection Time    07/22/13  8:04 PM      Result Value Range   Troponin I <0.30  <0.30 ng/mL   Comment:            Due to the release kinetics of cTnI,     a negative result within the first hours     of the onset of symptoms does not rule out     myocardial infarction with certainty.     If myocardial infarction is still suspected,     repeat the test at appropriate intervals.  COMPREHENSIVE METABOLIC PANEL     Status: Abnormal   Collection Time    07/23/13  2:47 AM      Result Value Range   Sodium 137  135 - 145 mEq/L   Potassium 4.1  3.5 - 5.1 mEq/L   Chloride 100  96 - 112 mEq/L   CO2 25  19 - 32 mEq/L   Glucose, Bld 107 (*) 70 - 99 mg/dL   BUN 11  6 - 23 mg/dL   Creatinine, Ser 8.29  0.50 - 1.10 mg/dL   Calcium 9.8  8.4 - 56.2 mg/dL   Total Protein 7.0  6.0 - 8.3 g/dL   Albumin 3.4 (*) 3.5 - 5.2 g/dL   AST 18  0 - 37 U/L   ALT 9  0 - 35 U/L   Alkaline Phosphatase 82  39 - 117 U/L   Total Bilirubin 0.4  0.3 - 1.2 mg/dL   GFR calc non Af Amer 50 (*) >90 mL/min   GFR calc Af Amer 58 (*) >90 mL/min   Comment: (NOTE)     The eGFR has been calculated using the CKD EPI equation.     This calculation has not been validated in all clinical situations.     eGFR's persistently <90 mL/min signify possible Chronic Kidney     Disease.  CBC     Status: Abnormal   Collection  Time    07/23/13  2:47 AM      Result Value Range   WBC 7.6  4.0 - 10.5 K/uL   RBC 3.29 (*) 3.87 - 5.11 MIL/uL   Hemoglobin 9.5 (*) 12.0 - 15.0 g/dL   HCT 13.0 (*) 86.5 - 78.4 %   MCV 86.6  78.0 - 100.0 fL   MCH 28.9  26.0 - 34.0 pg   MCHC 33.3  30.0 - 36.0 g/dL   RDW 69.6  29.5 - 28.4 %   Platelets 181  150 - 400 K/uL  TROPONIN I     Status: None   Collection Time    07/23/13  2:47 AM      Result Value Range   Troponin I <0.30  <0.30 ng/mL   Comment:            Due to the release kinetics of cTnI,     a negative result within the first hours     of the onset of symptoms does not rule out     myocardial infarction with certainty.     If myocardial infarction is still suspected,  repeat the test at appropriate intervals.    Dg Chest 2 View  07/22/2013   *RADIOLOGY REPORT*  Clinical Data: Altered mental status  CHEST - 2 VIEW  Comparison: 07/17/2013  Findings: 1409 hours.  Lung volumes are low. The cardiopericardial silhouette is enlarged.  The patient is status post CABG.  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  Left PICC line has been removed in the interval.  The left-sided Port-A-Cath remains in place with distal tip position overlying the proximal SVC.  IMPRESSION: Cardiomegaly with low lung volumes.  No acute cardiopulmonary findings.   Original Report Authenticated By: Kennith Center, M.D.   Ct Head Wo Contrast  07/22/2013   *RADIOLOGY REPORT*  Clinical Data: Increased confusion.  CT HEAD WITHOUT CONTRAST  Technique:  Contiguous axial images were obtained from the base of the skull through the vertex without contrast.  Comparison: Head CT 06/30/2013.  Findings: There is no evidence of acute intracranial abnormality including infarct, hemorrhage, mass lesion, mass effect, midline or abnormal extra axial fluid collection. Mild appearing chronic microvascular ischemic change is again seen. No hydrocephalus or pneumocephalus. Calvarium intact.  IMPRESSION: No acute finding.    Original Report Authenticated By: Holley Dexter, M.D.        Draper Gallon A. [name], M.D.  Diplomate, Biomedical engineer of Psychiatry and Neurology ( Neurology). 07/23/2013, 9:08 AM

## 2013-07-23 NOTE — Progress Notes (Signed)
UR Chart Review Completed  

## 2013-07-24 ENCOUNTER — Inpatient Hospital Stay (HOSPITAL_COMMUNITY)
Admit: 2013-07-24 | Discharge: 2013-07-24 | Disposition: A | Discharge status: Home / Self Care | Payer: PRIVATE HEALTH INSURANCE | Attending: Neurology | Admitting: Neurology

## 2013-07-24 LAB — URINE CULTURE: Colony Count: NO GROWTH

## 2013-07-24 NOTE — Progress Notes (Signed)
Subjective: She seems better. The help from neurology is noted and appreciated. EEG is underway.  Objective: Vital signs in last 24 hours: Temp:  [98.4 F (36.9 C)-98.6 F (37 C)] 98.6 F (37 C) (08/20 0640) Pulse Rate:  [67-85] 69 (08/20 0640) Resp:  [16-17] 17 (08/20 0640) BP: (115-149)/(61-88) 115/67 mmHg (08/20 0640) SpO2:  [96 %-97 %] 97 % (08/20 0640) Weight change:  Last BM Date: 07/23/13  Intake/Output from previous day: 08/19 0701 - 08/20 0700 In: 680 [P.O.:680] Out: -   PHYSICAL EXAM General appearance: alert, cooperative, morbidly obese and Mildly confused Resp: clear to auscultation bilaterally Cardio: regular rate and rhythm, S1, S2 normal, no murmur, click, rub or gallop GI: soft, non-tender; bowel sounds normal; no masses,  no organomegaly Extremities: extremities normal, atraumatic, no cyanosis or edema  Lab Results:    Basic Metabolic Panel:  Recent Labs  40/98/11 1347 07/23/13 0247  NA 136 137  K 4.4 4.1  CL 102 100  CO2 23 25  GLUCOSE 100* 107*  BUN 13 11  CREATININE 1.30* 1.06  CALCIUM 9.8 9.8   Liver Function Tests:  Recent Labs  07/22/13 1347 07/23/13 0247  AST 16 18  ALT 8 9  ALKPHOS 79 82  BILITOT 0.4 0.4  PROT 7.1 7.0  ALBUMIN 3.4* 3.4*   No results found for this basename: LIPASE, AMYLASE,  in the last 72 hours No results found for this basename: AMMONIA,  in the last 72 hours CBC:  Recent Labs  07/22/13 1347 07/23/13 0247  WBC 7.3 7.6  NEUTROABS 5.2  --   HGB 9.1* 9.5*  HCT 28.3* 28.5*  MCV 88.2 86.6  PLT 182 181   Cardiac Enzymes:  Recent Labs  07/22/13 2004 07/23/13 0247 07/23/13 0920  TROPONINI <0.30 <0.30 <0.30   BNP: No results found for this basename: PROBNP,  in the last 72 hours D-Dimer: No results found for this basename: DDIMER,  in the last 72 hours CBG: No results found for this basename: GLUCAP,  in the last 72 hours Hemoglobin A1C: No results found for this basename: HGBA1C,  in the  last 72 hours Fasting Lipid Panel: No results found for this basename: CHOL, HDL, LDLCALC, TRIG, CHOLHDL, LDLDIRECT,  in the last 72 hours Thyroid Function Tests:  Recent Labs  07/22/13 2004  TSH 0.613   Anemia Panel: No results found for this basename: VITAMINB12, FOLATE, FERRITIN, TIBC, IRON, RETICCTPCT,  in the last 72 hours Coagulation: No results found for this basename: LABPROT, INR,  in the last 72 hours Urine Drug Screen: Drugs of Abuse  No results found for this basename: labopia, cocainscrnur, labbenz, amphetmu, thcu, labbarb    Alcohol Level: No results found for this basename: ETH,  in the last 72 hours Urinalysis:  Recent Labs  07/22/13 1352  COLORURINE YELLOW  LABSPEC 1.025  PHURINE 5.5  GLUCOSEU NEGATIVE  HGBUR NEGATIVE  BILIRUBINUR NEGATIVE  KETONESUR NEGATIVE  PROTEINUR NEGATIVE  UROBILINOGEN 0.2  NITRITE NEGATIVE  LEUKOCYTESUR NEGATIVE   Misc. Labs:  ABGS No results found for this basename: PHART, PCO2, PO2ART, TCO2, HCO3,  in the last 72 hours CULTURES Recent Results (from the past 240 hour(s))  MRSA PCR SCREENING     Status: None   Collection Time    07/16/13  4:45 PM      Result Value Range Status   MRSA by PCR NEGATIVE  NEGATIVE Final   Comment:            The  GeneXpert MRSA Assay (FDA     approved for NASAL specimens     only), is one component of a     comprehensive MRSA colonization     surveillance program. It is not     intended to diagnose MRSA     infection nor to guide or     monitor treatment for     MRSA infections.  URINE CULTURE     Status: None   Collection Time    07/22/13  1:52 PM      Result Value Range Status   Specimen Description URINE, CATHETERIZED   Final   Special Requests NONE   Final   Culture  Setup Time     Final   Value: 07/23/2013 04:26     Performed at [facility]   Colony Count     Final   Value: NO GROWTH     Performed at [facility]   Culture     Final   Value: NO GROWTH      Performed at [facility]   Report Status 07/24/2013 FINAL   Final   Studies/Results: Dg Chest 2 View  07/22/2013   *RADIOLOGY REPORT*  Clinical Data: Altered mental status  CHEST - 2 VIEW  Comparison: 07/17/2013  Findings: 1409 hours.  Lung volumes are low. The cardiopericardial silhouette is enlarged.  The patient is status post CABG.  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  Left PICC line has been removed in the interval.  The left-sided Port-A-Cath remains in place with distal tip position overlying the proximal SVC.  IMPRESSION: Cardiomegaly with low lung volumes.  No acute cardiopulmonary findings.   Original Report Authenticated By: Kennith Center, M.D.   Ct Head Wo Contrast  07/22/2013   *RADIOLOGY REPORT*  Clinical Data: Increased confusion.  CT HEAD WITHOUT CONTRAST  Technique:  Contiguous axial images were obtained from the base of the skull through the vertex without contrast.  Comparison: Head CT 06/30/2013.  Findings: There is no evidence of acute intracranial abnormality including infarct, hemorrhage, mass lesion, mass effect, midline or abnormal extra axial fluid collection. Mild appearing chronic microvascular ischemic change is again seen. No hydrocephalus or pneumocephalus. Calvarium intact.  IMPRESSION: No acute finding.   Original Report Authenticated By: Holley Dexter, M.D.    Medications:  Prior to Admission:  Prescriptions prior to admission  Medication Sig Dispense Refill  . amLODipine (NORVASC) 10 MG tablet Take 10 mg by mouth daily.      Marland Kitchen aspirin EC 81 MG tablet Take 81 mg by mouth daily.      Marland Kitchen atorvastatin (LIPITOR) 40 MG tablet Take 40 mg by mouth daily.      . benazepril (LOTENSIN) 40 MG tablet Take 1 tablet (40 mg total) by mouth daily.  30 tablet  12  . Bromfenac Sodium (BROMDAY) 0.09 % SOLN Place 1 drop into the right eye 3 (three) times daily.      Marland Kitchen doxepin (SINEQUAN) 10 MG capsule Take 1 capsule (10 mg total) by mouth at bedtime.  30 capsule  12   . dronabinol (MARINOL) 2.5 MG capsule Take 1 capsule (2.5 mg total) by mouth 2 (two) times daily before lunch and supper.  60 capsule  1  . estradiol (ESTRACE) 0.5 MG tablet Take 0.5 mg by mouth daily.        . fentaNYL (DURAGESIC) 75 MCG/HR Place 1 patch (75 mcg total) onto the skin every 3 (three) days.  5 patch  0  . hydrALAZINE (APRESOLINE) 25 MG tablet Take 25 mg by mouth every 8 (eight) hours.      Marland Kitchen ketorolac (ACULAR) 0.5 % ophthalmic solution Place 1 drop into the right eye 3 (three) times daily.  5 mL  0  . levETIRAcetam (KEPPRA) 250 MG tablet Take 1 tablet (250 mg total) by mouth 2 (two) times daily.  60 tablet  12  . magnesium hydroxide (MILK OF MAGNESIA) 400 MG/5ML suspension Takes couple of tablespoons daily.      . metFORMIN (GLUCOPHAGE) 500 MG tablet Take 500 mg by mouth 2 (two) times daily with a meal.       . metoprolol tartrate (LOPRESSOR) 25 MG tablet Take 12.5 mg by mouth 2 (two) times daily.      . nitroGLYCERIN (NITROSTAT) 0.4 MG SL tablet Place 0.4 mg under the tongue every 5 (five) minutes as needed for chest pain.       Marland Kitchen ondansetron (ZOFRAN) 4 MG tablet Take 1 tablet (4 mg total) by mouth every 8 (eight) hours as needed.  20 tablet  0  . pantoprazole (PROTONIX) 40 MG tablet Take 40 mg by mouth 2 (two) times daily.      . potassium chloride (K-DUR) 10 MEQ tablet Take 2 tablets (20 mEq total) by mouth daily.  60 tablet  3  . senna-docusate (SENOKOT-S) 8.6-50 MG per tablet Take 1 tablet by mouth daily as needed for constipation.       . traMADol (ULTRAM) 50 MG tablet Take 50 mg by mouth every 6 (six) hours as needed for pain.       Scheduled: . amLODipine  10 mg Oral Daily  . aspirin EC  81 mg Oral Daily  . atorvastatin  40 mg Oral q1800  . dronabinol  2.5 mg Oral BID AC  . enoxaparin (LOVENOX) injection  40 mg Subcutaneous Q24H  . hydrALAZINE  25 mg Oral Q8H  . ketorolac  1 drop Right Eye TID  . levETIRAcetam  500 mg Oral BID  . metoprolol tartrate  12.5 mg Oral BID   . pantoprazole  40 mg Oral BID  . potassium chloride  20 mEq Oral Daily  . sodium chloride  3 mL Intravenous Q12H   Continuous: . sodium chloride     QMV:[id number], acetaminophen, levalbuterol, ondansetron (ZOFRAN) IV, ondansetron  Assesment: She came in with recurrent acute delirium. EEG is pending. These may be from seizures. I have changed medications as per Dr. Ronal Fear recommendations. I think she can probably go home tomorrow but I would like to make sure she is okay off medications Active Problems:   Diabetes mellitus, type II   Hypertension   Hyperlipidemia   Acute delirium    Plan: Discontinue medications and hopefully home tomorrow    LOS: 2 days   Becka Lagasse L 07/24/2013, 8:55 AM

## 2013-07-24 NOTE — Progress Notes (Signed)
Patient ID: [name], female   DOB: [birth year], 72 y.o.   MRN: [id number]  [facility] NEUROLOGY Yianna Tersigni A. [name], MD     [email or web address]          [name] is an 72 y.o. female.   Assessment/Plan: Recurrent spells of confusion and encephalopathy suspected of being due to seizures/complex partial seizures and/or medication effect. She has done well off the fentanyl and Ultram. An EEG is being done. She also has done well with the hard dose of Keppra.  She is awake and alert. She is oriented to person place month. She follows commands well. Facial muscle strength is symmetric and extra ocular movements are intact. She has antigravity strength throughout.    Objective: Vital signs in last 24 hours: Temp:  [98.4 F (36.9 C)-98.6 F (37 C)] 98.6 F (37 C) (08/20 0640) Pulse Rate:  [67-85] 69 (08/20 0640) Resp:  [16-17] 17 (08/20 0640) BP: (115-149)/(61-88) 115/67 mmHg (08/20 0640) SpO2:  [96 %-97 %] 97 % (08/20 0640)  Intake/Output from previous day: 08/19 0701 - 08/20 0700 In: 680 [P.O.:680] Out: -  Intake/Output this shift:   Nutritional status: Cardiac   Lab Results: Results for orders placed during the hospital encounter of 07/22/13 (from the past 48 hour(s))  CBC WITH DIFFERENTIAL     Status: Abnormal   Collection Time    07/22/13  1:47 PM      Result Value Range   WBC 7.3  4.0 - 10.5 K/uL   RBC 3.21 (*) 3.87 - 5.11 MIL/uL   Hemoglobin 9.1 (*) 12.0 - 15.0 g/dL   HCT 02.7 (*) 25.3 - 66.4 %   MCV 88.2  78.0 - 100.0 fL   MCH 28.3  26.0 - 34.0 pg   MCHC 32.2  30.0 - 36.0 g/dL   RDW 40.3  47.4 - 25.9 %   Platelets 182  150 - 400 K/uL   Neutrophils Relative % 71  43 - 77 %   Neutro Abs 5.2  1.7 - 7.7 K/uL   Lymphocytes Relative 16  12 - 46 %   Lymphs Abs 1.2  0.7 - 4.0 K/uL   Monocytes Relative 9  3 - 12 %   Monocytes Absolute 0.7  0.1 - 1.0 K/uL   Eosinophils Relative 4  0 - 5 %   Eosinophils Absolute 0.3  0.0 - 0.7 K/uL   Basophils Relative 0  0  - 1 %   Basophils Absolute 0.0  0.0 - 0.1 K/uL  COMPREHENSIVE METABOLIC PANEL     Status: Abnormal   Collection Time    07/22/13  1:47 PM      Result Value Range   Sodium 136  135 - 145 mEq/L   Potassium 4.4  3.5 - 5.1 mEq/L   Chloride 102  96 - 112 mEq/L   CO2 23  19 - 32 mEq/L   Glucose, Bld 100 (*) 70 - 99 mg/dL   BUN 13  6 - 23 mg/dL   Creatinine, Ser 5.63 (*) 0.50 - 1.10 mg/dL   Calcium 9.8  8.4 - 87.5 mg/dL   Total Protein 7.1  6.0 - 8.3 g/dL   Albumin 3.4 (*) 3.5 - 5.2 g/dL   AST 16  0 - 37 U/L   ALT 8  0 - 35 U/L   Alkaline Phosphatase 79  39 - 117 U/L   Total Bilirubin 0.4  0.3 - 1.2 mg/dL   GFR calc non Af Amer 39 (*) >  90 mL/min   GFR calc Af Amer 46 (*) >90 mL/min   Comment: (NOTE)     The eGFR has been calculated using the CKD EPI equation.     This calculation has not been validated in all clinical situations.     eGFR's persistently <90 mL/min signify possible Chronic Kidney     Disease.  TROPONIN I     Status: None   Collection Time    07/22/13  1:47 PM      Result Value Range   Troponin I <0.30  <0.30 ng/mL   Comment:            Due to the release kinetics of cTnI,     a negative result within the first hours     of the onset of symptoms does not rule out     myocardial infarction with certainty.     If myocardial infarction is still suspected,     repeat the test at appropriate intervals.  URINALYSIS, ROUTINE W REFLEX MICROSCOPIC     Status: None   Collection Time    07/22/13  1:52 PM      Result Value Range   Color, Urine YELLOW  YELLOW   APPearance CLEAR  CLEAR   Specific Gravity, Urine 1.025  1.005 - 1.030   pH 5.5  5.0 - 8.0   Glucose, UA NEGATIVE  NEGATIVE mg/dL   Hgb urine dipstick NEGATIVE  NEGATIVE   Bilirubin Urine NEGATIVE  NEGATIVE   Ketones, ur NEGATIVE  NEGATIVE mg/dL   Protein, ur NEGATIVE  NEGATIVE mg/dL   Urobilinogen, UA 0.2  0.0 - 1.0 mg/dL   Nitrite NEGATIVE  NEGATIVE   Leukocytes, UA NEGATIVE  NEGATIVE   Comment: MICROSCOPIC  NOT DONE ON URINES WITH NEGATIVE PROTEIN, BLOOD, LEUKOCYTES, NITRITE, OR GLUCOSE <1000 mg/dL.  URINE CULTURE     Status: None   Collection Time    07/22/13  1:52 PM      Result Value Range   Specimen Description URINE, CATHETERIZED     Special Requests NONE     Culture  Setup Time       Value: 07/23/2013 04:26     Performed at [facility] Count       Value: NO GROWTH     Performed at [facility]   Culture       Value: NO GROWTH     Performed at [facility]   Report Status 07/24/2013 FINAL    LACTIC ACID, PLASMA     Status: None   Collection Time    07/22/13  2:11 PM      Result Value Range   Lactic Acid, Venous 1.0  0.5 - 2.2 mmol/L  TSH     Status: None   Collection Time    07/22/13  8:04 PM      Result Value Range   TSH 0.613  0.350 - 4.500 uIU/mL   Comment: Performed at [facility]  TROPONIN I     Status: None   Collection Time    07/22/13  8:04 PM      Result Value Range   Troponin I <0.30  <0.30 ng/mL   Comment:            Due to the release kinetics of cTnI,     a negative result within the first hours     of the onset of symptoms does not rule out  myocardial infarction with certainty.     If myocardial infarction is still suspected,     repeat the test at appropriate intervals.  COMPREHENSIVE METABOLIC PANEL     Status: Abnormal   Collection Time    07/23/13  2:47 AM      Result Value Range   Sodium 137  135 - 145 mEq/L   Potassium 4.1  3.5 - 5.1 mEq/L   Chloride 100  96 - 112 mEq/L   CO2 25  19 - 32 mEq/L   Glucose, Bld 107 (*) 70 - 99 mg/dL   BUN 11  6 - 23 mg/dL   Creatinine, Ser 0.96  0.50 - 1.10 mg/dL   Calcium 9.8  8.4 - 04.5 mg/dL   Total Protein 7.0  6.0 - 8.3 g/dL   Albumin 3.4 (*) 3.5 - 5.2 g/dL   AST 18  0 - 37 U/L   ALT 9  0 - 35 U/L   Alkaline Phosphatase 82  39 - 117 U/L   Total Bilirubin 0.4  0.3 - 1.2 mg/dL   GFR calc non Af Amer 50 (*) >90 mL/min   GFR calc Af Amer 58 (*) >90 mL/min    Comment: (NOTE)     The eGFR has been calculated using the CKD EPI equation.     This calculation has not been validated in all clinical situations.     eGFR's persistently <90 mL/min signify possible Chronic Kidney     Disease.  CBC     Status: Abnormal   Collection Time    07/23/13  2:47 AM      Result Value Range   WBC 7.6  4.0 - 10.5 K/uL   RBC 3.29 (*) 3.87 - 5.11 MIL/uL   Hemoglobin 9.5 (*) 12.0 - 15.0 g/dL   HCT 40.9 (*) 81.1 - 91.4 %   MCV 86.6  78.0 - 100.0 fL   MCH 28.9  26.0 - 34.0 pg   MCHC 33.3  30.0 - 36.0 g/dL   RDW 78.2  95.6 - 21.3 %   Platelets 181  150 - 400 K/uL  TROPONIN I     Status: None   Collection Time    07/23/13  2:47 AM      Result Value Range   Troponin I <0.30  <0.30 ng/mL   Comment:            Due to the release kinetics of cTnI,     a negative result within the first hours     of the onset of symptoms does not rule out     myocardial infarction with certainty.     If myocardial infarction is still suspected,     repeat the test at appropriate intervals.  TROPONIN I     Status: None   Collection Time    07/23/13  9:20 AM      Result Value Range   Troponin I <0.30  <0.30 ng/mL   Comment:            Due to the release kinetics of cTnI,     a negative result within the first hours     of the onset of symptoms does not rule out     myocardial infarction with certainty.     If myocardial infarction is still suspected,     repeat the test at appropriate intervals.    Lipid Panel No results found for this basename: CHOL, TRIG, HDL, CHOLHDL, VLDL, LDLCALC,  in  the last 72 hours  Studies/Results: Dg Chest 2 View  07/22/2013   *RADIOLOGY REPORT*  Clinical Data: Altered mental status  CHEST - 2 VIEW  Comparison: 07/17/2013  Findings: 1409 hours.  Lung volumes are low. The cardiopericardial silhouette is enlarged.  The patient is status post CABG.  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  Left PICC line has been removed in the interval.  The  left-sided Port-A-Cath remains in place with distal tip position overlying the proximal SVC.  IMPRESSION: Cardiomegaly with low lung volumes.  No acute cardiopulmonary findings.   Original Report Authenticated By: Kennith Center, M.D.   Ct Head Wo Contrast  07/22/2013   *RADIOLOGY REPORT*  Clinical Data: Increased confusion.  CT HEAD WITHOUT CONTRAST  Technique:  Contiguous axial images were obtained from the base of the skull through the vertex without contrast.  Comparison: Head CT 06/30/2013.  Findings: There is no evidence of acute intracranial abnormality including infarct, hemorrhage, mass lesion, mass effect, midline or abnormal extra axial fluid collection. Mild appearing chronic microvascular ischemic change is again seen. No hydrocephalus or pneumocephalus. Calvarium intact.  IMPRESSION: No acute finding.   Original Report Authenticated By: Holley Dexter, M.D.    Medications:  Scheduled Meds: . amLODipine  10 mg Oral Daily  . aspirin EC  81 mg Oral Daily  . atorvastatin  40 mg Oral q1800  . dronabinol  2.5 mg Oral BID AC  . enoxaparin (LOVENOX) injection  40 mg Subcutaneous Q24H  . hydrALAZINE  25 mg Oral Q8H  . ketorolac  1 drop Right Eye TID  . levETIRAcetam  500 mg Oral BID  . metoprolol tartrate  12.5 mg Oral BID  . pantoprazole  40 mg Oral BID  . potassium chloride  20 mEq Oral Daily  . sodium chloride  3 mL Intravenous Q12H   Continuous Infusions: . sodium chloride     PRN Meds:.acetaminophen, acetaminophen, levalbuterol, ondansetron (ZOFRAN) IV, ondansetron     LOS: 2 days   Janise Gora A. [name], M.D.  Diplomate, Biomedical engineer of Psychiatry and Neurology ( Neurology).

## 2013-07-24 NOTE — Progress Notes (Signed)
EEG Completed; Results Pending  

## 2013-07-25 MED ORDER — LEVETIRACETAM 500 MG PO TABS: 500.0000 mg | ORAL_TABLET | Freq: Two times a day (BID) | ORAL | Status: DC

## 2013-07-25 MED ORDER — ZOLPIDEM TARTRATE 5 MG PO TABS
5.0000 mg | ORAL_TABLET | Freq: Once | ORAL | Status: AC
  Administered 2013-07-25: 5 mg via ORAL
  Filled 2013-07-25: qty 1

## 2013-07-25 NOTE — Procedures (Signed)
[facility] Aadon Gorelik A. [name], MD     [email or web address]        NAMERHYLI, DEPAULA                ACCOUNT NO.:  [id number]  MEDICAL RECORD NO.:  [id number]  LOCATION:  EE                           FACILITY:  [facility]  PHYSICIAN:  Laneice Meneely A. [name], M.D. DATE OF BIRTH:  [birth year]  DATE OF PROCEDURE:  07/24/2013 DATE OF DISCHARGE:  07/24/2013                             EEG INTERPRETATION   INDICATIONS:  A 72 year old female who reports with recurrent spells of confusion and history of recurrent seizures.  MEDICATIONS:  Tylenol, Norvasc, aspirin, Lipitor, Marinol, levothyroxine, Xopenex, Keppra, Lopressor, Zofran, Protonix.  ANALYSIS:  A 16 channel recording using standard 10/20 measurements conducted for 21 minutes.  There is a well-formed posterior dominant rhythm of 7 hertz, which attenuates with eye opening.  The patient is noted to have awake and drowsy activities.  Photic stimulation and hypoventilation were not carried out.  There is episodic generalized delta activity.  There is no focal slowing, no lateralized slowing, or no epileptiform activity observed.  IMPRESSION:  Moderate generalized slowing, otherwise no epileptiform activity.     Aliya Sol A. [name], M.D.     KAD/MEDQ  D:  07/25/2013  T:  07/25/2013  Job:  [id number]

## 2013-07-25 NOTE — Progress Notes (Signed)
She is better .less confused. Her daughter says she's back to baseline or perhaps a little bit better. Her chest is clear. Heart is regular. She is still mildly confused but better. My assessment is that she has improved and is ready for discharge and plan is for discharge home

## 2013-07-25 NOTE — Care Management Note (Signed)
    Page 1 of 1   07/25/2013     11:00:20 AM   CARE MANAGEMENT NOTE 07/25/2013  Patient:  [name],[name]   Account Number:  [id number]  Date Initiated:  07/25/2013  Documentation initiated by:  Rosemary Holms  Subjective/Objective Assessment:   Pt in observation. Lives at home with daughter. To be DC home with resumption of HH with [facility]     Action/Plan:   Anticipated DC Date:  07/25/2013   Anticipated DC Plan:  HOME W HOME HEALTH SERVICES      DC Planning Services  CM consult      Choice offered to / List presented to:             Status of service:  Completed, signed off Medicare Important Message given?   (If response is "NO", the following Medicare IM given date fields will be blank) Date Medicare IM given:   Date Additional Medicare IM given:    Discharge Disposition:  HOME W HOME HEALTH SERVICES  Per UR Regulation:    If discussed at Long Length of Stay Meetings, dates discussed:    Comments:  07/25/13 Rosemary Holms RN BSN CM Pt signed code [id number], copy given to pt.

## 2013-07-25 NOTE — Progress Notes (Signed)
Went to discharge patient, and observed patient to be more confused. Daughter also concerned that patient was more confused. Daughter stated, that she did not think she could take care of the patient at home if she was confused. Dr. Juanetta Gosling notified with orders to cancel discharge and he would notify case management. Daughter informed.

## 2013-07-25 NOTE — Progress Notes (Signed)
Patient ID: Dayle Points, female   DOB: [birth year], 72 y.o.   MRN: [id number]  [facility] [address] NEUROLOGY Mell Mellott A. [name], MD     [email or web address]          Ammi MARJEAN IMPERATO is an 72 y.o. female.   Assessment/Plan: 1. Recurrent spells of encephalopathy and recurrent spells of seizures. Patient has done well. She is awake and alert and seems to be at baseline. She is oriented to person and place and year. She follows commands well. She moved both sides well. EEG has been negative for recurrent With activities. We suggest that she continues with the high-dose Keppra. Again, the fentanyl and Ultram has been discontinued. Patient to follow-up in our office in about a month or so.    Objective: Vital signs in last 24 hours: Temp:  [98.2 F (36.8 C)-98.8 F (37.1 C)] 98.2 F (36.8 C) (08/21 0603) Pulse Rate:  [67-70] 67 (08/21 0603) Resp:  [16] 16 (08/21 0603) BP: (110-196)/(70-94) 196/94 mmHg (08/21 0603) SpO2:  [96 %-99 %] 98 % (08/21 0603) Weight:  [89.767 kg (197 lb 14.4 oz)-94.8 kg (208 lb 15.9 oz)] 89.767 kg (197 lb 14.4 oz) (08/21 0603)  Intake/Output from previous day: 08/20 0701 - 08/21 0700 In: 335.8 [I.V.:335.8] Out: -  Intake/Output this shift:   Nutritional status: Cardiac   Lab Results: Results for orders placed during the hospital encounter of 07/22/13 (from the past 48 hour(s))  TROPONIN I     Status: None   Collection Time    07/23/13  9:20 AM      Result Value Range   Troponin I <0.30  <0.30 ng/mL   Comment:            Due to the release kinetics of cTnI,     a negative result within the first hours     of the onset of symptoms does not rule out     myocardial infarction with certainty.     If myocardial infarction is still suspected,     repeat the test at appropriate intervals.    Lipid Panel No results found for this basename: CHOL, TRIG, HDL, CHOLHDL, VLDL, LDLCALC,  in the last 72 hours  Studies/Results: No results found.  Medications:    Scheduled Meds: . amLODipine  10 mg Oral Daily  . aspirin EC  81 mg Oral Daily  . atorvastatin  40 mg Oral q1800  . dronabinol  2.5 mg Oral BID AC  . enoxaparin (LOVENOX) injection  40 mg Subcutaneous Q24H  . hydrALAZINE  25 mg Oral Q8H  . ketorolac  1 drop Right Eye TID  . levETIRAcetam  500 mg Oral BID  . metoprolol tartrate  12.5 mg Oral BID  . pantoprazole  40 mg Oral BID  . potassium chloride  20 mEq Oral Daily  . sodium chloride  3 mL Intravenous Q12H   Continuous Infusions: . sodium chloride 50 mL/hr at 07/24/13 1142   PRN Meds:.acetaminophen, acetaminophen, levalbuterol, ondansetron (ZOFRAN) IV, ondansetron     LOS: 3 days   Per Beagley A. [name], M.D.  Diplomate, Biomedical engineer of Psychiatry and Neurology ( Neurology).

## 2013-07-26 DIAGNOSIS — IMO0002 Reserved for concepts with insufficient information to code with codable children: Secondary | ICD-10-CM | POA: Diagnosis present

## 2013-07-26 MED ORDER — HEPARIN SOD (PORK) LOCK FLUSH 100 UNIT/ML IV SOLN
500.0000 [IU] | INTRAVENOUS | Status: AC | PRN
  Administered 2013-07-26: 500 [IU]
  Filled 2013-07-26: qty 5

## 2013-07-26 NOTE — Plan of Care (Signed)
Problem: Discharge Progression Outcomes Goal: Other Discharge Outcomes/Goals Outcome: Completed/Met Date Met:  07/26/13 Discharged with home health

## 2013-07-26 NOTE — Progress Notes (Signed)
Her confusion is much better today. Her daughter feels confident that she can take care of her at home. She will have home health services. I will plan to discharge her please see discharge summary for details

## 2013-07-26 NOTE — Discharge Summary (Signed)
Physician Discharge Summary  Patient ID: [name] MRN: [id number] DOB/AGE: 06-19-1941 72 y.o. Primary Care Physician:Shaneece Stockburger L, MD Admit date: 07/22/2013 Discharge date: 07/26/2013    Discharge Diagnoses:   Active Problems:   Diabetes mellitus, type II   Hypertension   Hyperlipidemia   Acute delirium   Failure to thrive  seizure disorder    Medication List    STOP taking these medications       dronabinol 2.5 MG capsule  Commonly known as:  MARINOL     fentaNYL 75 MCG/HR  Commonly known as:  DURAGESIC     traMADol 50 MG tablet  Commonly known as:  ULTRAM      TAKE these medications       amLODipine 10 MG tablet  Commonly known as:  NORVASC  Take 10 mg by mouth daily.     aspirin EC 81 MG tablet  Take 81 mg by mouth daily.     atorvastatin 40 MG tablet  Commonly known as:  LIPITOR  Take 40 mg by mouth daily.     benazepril 40 MG tablet  Commonly known as:  LOTENSIN  Take 1 tablet (40 mg total) by mouth daily.     BROMDAY 0.09 % Soln  Generic drug:  Bromfenac Sodium  Place 1 drop into the right eye 3 (three) times daily.     doxepin 10 MG capsule  Commonly known as:  SINEQUAN  Take 1 capsule (10 mg total) by mouth at bedtime.     estradiol 0.5 MG tablet  Commonly known as:  ESTRACE  Take 0.5 mg by mouth daily.     hydrALAZINE 25 MG tablet  Commonly known as:  APRESOLINE  Take 25 mg by mouth every 8 (eight) hours.     ketorolac 0.5 % ophthalmic solution  Commonly known as:  ACULAR  Place 1 drop into the right eye 3 (three) times daily.     levETIRAcetam 500 MG tablet  Commonly known as:  KEPPRA  Take 1 tablet (500 mg total) by mouth 2 (two) times daily.     magnesium hydroxide 400 MG/5ML suspension  Commonly known as:  MILK OF MAGNESIA  Takes couple of tablespoons daily.     metFORMIN 500 MG tablet  Commonly known as:  GLUCOPHAGE  Take 500 mg by mouth 2 (two) times daily with a meal.     metoprolol tartrate 25 MG tablet   Commonly known as:  LOPRESSOR  Take 12.5 mg by mouth 2 (two) times daily.     nitroGLYCERIN 0.4 MG SL tablet  Commonly known as:  NITROSTAT  Place 0.4 mg under the tongue every 5 (five) minutes as needed for chest pain.     ondansetron 4 MG tablet  Commonly known as:  ZOFRAN  Take 1 tablet (4 mg total) by mouth every 8 (eight) hours as needed.     pantoprazole 40 MG tablet  Commonly known as:  PROTONIX  Take 40 mg by mouth 2 (two) times daily.     potassium chloride 10 MEQ tablet  Commonly known as:  K-DUR  Take 2 tablets (20 mEq total) by mouth daily.     senna-docusate 8.6-50 MG per tablet  Commonly known as:  Senokot-S  Take 1 tablet by mouth daily as needed for constipation.        Discharged Condition: Improved    Consults: Neurology  Significant Diagnostic Studies: Dg Chest 2 View  07/22/2013   *RADIOLOGY REPORT*  Clinical Data: Altered  mental status  CHEST - 2 VIEW  Comparison: 07/17/2013  Findings: 1409 hours.  Lung volumes are low. The cardiopericardial silhouette is enlarged.  The patient is status post CABG.  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  Left PICC line has been removed in the interval.  The left-sided Port-A-Cath remains in place with distal tip position overlying the proximal SVC.  IMPRESSION: Cardiomegaly with low lung volumes.  No acute cardiopulmonary findings.   Original Report Authenticated By: Kennith Center, M.D.   Ct Head Wo Contrast  07/22/2013   *RADIOLOGY REPORT*  Clinical Data: Increased confusion.  CT HEAD WITHOUT CONTRAST  Technique:  Contiguous axial images were obtained from the base of the skull through the vertex without contrast.  Comparison: Head CT 06/30/2013.  Findings: There is no evidence of acute intracranial abnormality including infarct, hemorrhage, mass lesion, mass effect, midline or abnormal extra axial fluid collection. Mild appearing chronic microvascular ischemic change is again seen. No hydrocephalus or pneumocephalus.  Calvarium intact.  IMPRESSION: No acute finding.   Original Report Authenticated By: Holley Dexter, M.D.   Ct Head Wo Contrast  06/30/2013   *RADIOLOGY REPORT*  Clinical Data: Stroke  CT HEAD WITHOUT CONTRAST  Technique:  Contiguous axial images were obtained from the base of the skull through the vertex without contrast.  Comparison: 06/28/2013  Findings: Mild chronic ischemic changes in the periventricular white matter.  Mild global atrophy appropriate to age.  No mass effect, midline shift, or acute intracranial hemorrhage.  Cranium is intact.  Mastoid air cells are clear.  IMPRESSION: No acute intracranial pathology.  Chronic ischemic changes and atrophy are noted.   Original Report Authenticated By: Jolaine Click, M.D.   Ct Head Wo Contrast  06/28/2013   *RADIOLOGY REPORT*  Clinical Data: Mental status change, history hypertension, diabetes, seizures, COPD, vascular disease  CT HEAD WITHOUT CONTRAST  Technique:  Contiguous axial images were obtained from the base of the skull through the vertex without contrast.  Comparison: None  Findings: Generalized atrophy. Normal ventricular morphology. No midline shift or mass effect. Small vessel chronic ischemic changes of deep cerebral white matter. No intracranial hemorrhage, mass lesion, or evidence of acute infarction. No extra-axial fluid collections. Atherosclerotic calcifications at skull base. No acute bone or sinus abnormality.  IMPRESSION: Atrophy with small vessel chronic ischemic changes of deep cerebral white matter. No acute intracranial abnormalities.   Original Report Authenticated By: Ulyses Southward, M.D.   Ct Abdomen Pelvis W Contrast  06/29/2013   *RADIOLOGY REPORT*  Clinical Data: Abdominal pain.  Confusion.  Decreased appetite and epigastric pain.  Constipation.  CT ABDOMEN AND PELVIS WITH CONTRAST  Technique:  Multidetector CT imaging of the abdomen and pelvis was performed following the standard protocol during bolus administration of  intravenous contrast.  Contrast: 50mL OMNIPAQUE IOHEXOL 300 MG/ML  SOLN, OMNIPAQUE IOHEXOL 300 MG/ML  SOLN  Comparison: 09/16/2012  Findings: Atelectasis or fibrosis in the lung bases.  Cardiac enlargement.  Surgical absence of the gallbladder.  Small esophageal hiatal hernia.  Gastric wall is not thickened.  Small bowel are not abnormally distended.  No bowel wall thickening.  Stool filled colon without distension or wall thickening.  The liver, spleen, pancreas, adrenal glands, kidneys, inferior vena cava, and retroperitoneal lymph nodes are unremarkable. Calcification of abdominal aorta without aneurysm.  No free fluid or free air in the abdomen.  Prominent visceral adipose tissues. Umbilical/periumbilical hernia containing fat.  Scattered surgical clips in the abdominal wall.  Pelvis:  Surgical clips in the  right groin/inguinal region. Visualization of portions of the pelvis is limited due to streak artifact from right hip prosthesis.  The bladder wall appears mildly thickened.  This could be due to cystitis.  The uterus appears to be surgically absent.  No abnormal adnexal masses. Stool filled rectosigmoid colon.  No evidence of diverticulitis. The appendix is not identified.  No free or loculated pelvic fluid collections.  No significant pelvic lymphadenopathy.  Postoperative and degenerative changes in the lumbar spine.  There is anterior subluxation of L4 on L5 but this appears stable since previous study.  No destructive bone lesions appreciated.  Degenerative disc disease at L2-3.  IMPRESSION: Suggestion of mild bladder wall thickening which could represent cystitis.  No evidence of bowel obstruction.  No acute processes otherwise identified.   Original Report Authenticated By: Burman Nieves, M.D.   US Carotid Duplex Bilateral  07/01/2013   *RADIOLOGY REPORT*  Clinical Data: Hypertension, diabetes, elevated cholesterol  BILATERAL CAROTID DUPLEX ULTRASOUND  Technique: Gray scale imaging, color  Doppler and duplex ultrasound were performed of bilateral carotid and vertebral arteries in the neck.  Comparison:  None.  Criteria:  Quantification of carotid stenosis is based on velocity parameters that correlate the residual internal carotid diameter with NASCET-based stenosis levels, using the diameter of the distal internal carotid lumen as the denominator for stenosis measurement.  The following velocity measurements were obtained:                   PEAK SYSTOLIC/END DIASTOLIC RIGHT ICA:                        100/12cm/sec CCA:                        63/6cm/sec SYSTOLIC ICA/CCA RATIO:     1.60 DIASTOLIC ICA/CCA RATIO:    2.00 ECA:                        96cm/sec  LEFT ICA:                        65/12cm/sec CCA:                        81/15cm/sec SYSTOLIC ICA/CCA RATIO:     0.80 DIASTOLIC ICA/CCA RATIO:    0.85 ECA:                        70cm/sec  Findings:  RIGHT CAROTID ARTERY: Minor right carotid system plaque formation. No hemodynamically significant right ICA stenosis, velocity elevation, turbulent flow.  RIGHT VERTEBRAL ARTERY:  Antegrade  LEFT CAROTID ARTERY: Similar minor plaque formation.  No hemodynamically significant left ICA stenosis, velocity elevation, or turbulent flow.  LEFT VERTEBRAL ARTERY:  Antegrade  IMPRESSION: Minor carotid plaque formation.  No hemodynamically significant ICA stenosis by ultrasound   Original Report Authenticated By: Judie Petit. Miles Costain, M.D.   Dg Chest Port 1 View  07/17/2013   *RADIOLOGY REPORT*  Clinical Data: Post left subclavian Port-A-Cath placement.  PORTABLE CHEST - 1 VIEW  Comparison: 07/14/2013  Findings: Exam demonstrates no change in a left-sided PICC line. Dual lead right-sided cardiac pacemaker unchanged.  There has been interval placement of a left subclavian Port-A-Cath with tip over the region of the SVC at the level of the carina.  Lungs are adequately inflated without focal consolidation or  effusion. Continued minimal prominence of the perihilar vasculature  as cannot exclude mild vascular congestion.  There is no evidence of pneumothorax.  There is mild stable cardiomegaly. Remainder of the exam is unchanged.  IMPRESSION: Suggestion of minimal vascular congestion and mild cardiomegaly unchanged.  Tubes and lines as described.  Interval placement of a left subclavian Port-A-Cath with tip over the SVC.   Original Report Authenticated By: Elberta Fortis, M.D.   Dg Chest Port 1 View  07/14/2013   *RADIOLOGY REPORT*  Clinical Data: PICC line  PORTABLE CHEST - 1 VIEW  Comparison: 06/28/2013  Findings: Left arm PICC tip in the SVC in good position.  Cardiac enlargement with mild vascular congestion.  Negative for edema.  IMPRESSION: PICC tip in the mid SVC.  Mild vascular congestion without edema   Original Report Authenticated By: Janeece Riggers, M.D.   Dg Chest Port 1 View  07/01/2013   *RADIOLOGY REPORT*  Clinical Data: PICC line placement  PORTABLE CHEST - 1 VIEW  Comparison: 06/28/2013  Findings: Left-sided PICC line tip terminates over the mid SVC. Evidence of CABG noted.  Right-sided dual lead pacer in place. Probable skin fold over the left lung apex.  No evidence of pneumothorax.  Technique was optimized for detection of the recently placed line.  Cardiomegaly noted without evidence for edema.  IMPRESSION: Left-sided PICC line with tip over the mid SVC, no pneumothorax.   Original Report Authenticated By: Christiana Pellant, M.D.   Dg Abd Acute W/chest  06/28/2013   *RADIOLOGY REPORT*  Clinical Data: Constipation, confusion, vomiting, hypertension, diabetes, COPD, GERD  ACUTE ABDOMEN SERIES (ABDOMEN 2 VIEW & CHEST 1 VIEW)  Comparison: 03/03/2013  Findings: Right subclavian transvenous pacemaker leads project over right atrium and right ventricle. Enlargement of cardiac silhouette post CABG. Tortuous aorta. Pulmonary vascularity normal. Lungs clear. No pleural effusion or pneumothorax. Prior lumbar fusion. Surgical clips right upper quadrant question cholecystectomy.  Prior right hip replacement. Nonobstructive bowel gas pattern. No bowel dilatation, bowel wall thickening, or free intraperitoneal air. Osteoarthritic changes left hip. Diffuse osseous demineralization.  IMPRESSION: No acute abdominal findings. Enlargement of cardiac silhouette post pacemaker and CABG.   Original Report Authenticated By: Ulyses Southward, M.D.   Dg C-arm 1-60 Min-no Report  07/17/2013   CLINICAL DATA: port a cath insertion   C-ARM 1-60 MINUTES  Fluoroscopy was utilized by the requesting physician.  No radiographic  interpretation.     Lab Results: Basic Metabolic Panel: No results found for this basename: NA, K, CL, CO2, GLUCOSE, BUN, CREATININE, CALCIUM, MG, PHOS,  in the last 72 hours Liver Function Tests: No results found for this basename: AST, ALT, ALKPHOS, BILITOT, PROT, ALBUMIN,  in the last 72 hours   CBC: No results found for this basename: WBC, NEUTROABS, HGB, HCT, MCV, PLT,  in the last 72 hours  Recent Results (from the past 240 hour(s))  MRSA PCR SCREENING     Status: None   Collection Time    07/16/13  4:45 PM      Result Value Range Status   MRSA by PCR NEGATIVE  NEGATIVE Final   Comment:            The GeneXpert MRSA Assay (FDA     approved for NASAL specimens     only), is one component of a     comprehensive MRSA colonization     surveillance program. It is not     intended to diagnose MRSA     infection nor to guide  or     monitor treatment for     MRSA infections.  URINE CULTURE     Status: None   Collection Time    07/22/13  1:52 PM      Result Value Range Status   Specimen Description URINE, CATHETERIZED   Final   Special Requests NONE   Final   Culture  Setup Time     Final   Value: 07/23/2013 04:26     Performed at [facility] Count     Final   Value: NO GROWTH     Performed at [facility]   Culture     Final   Value: NO GROWTH     Performed at [facility]   Report Status 07/24/2013 FINAL   Final      Hospital Course: She was admitted with altered mental status. She has had multiple episodes similar to this. She generally improves when she gets in the hospital and then gets worse when she goes back home so it is felt that probably is some combination of medications at home. Again she was confused but better in the next morning. Consultation was obtained with Dr. [name] of neurology and he felt that perhaps we should discontinue fentanyl change keppra. We thought she was ready for discharge and arrangements have been made on the 21st but she became more confused and this was delayed until the 22nd Discharge Exam: Blood pressure 165/83, pulse 69, temperature 99 F (37.2 C), temperature source Oral, resp. rate 18, height 5\' 3"  (1.6 m), weight 89.767 kg (197 lb 14.4 oz), SpO2 94.00%. She is awake and alert. She is mildly confused which is her baseline. She is morbidly obese. Her chest is clear  Disposition: Home with home health services      Discharge Orders   Future Orders Complete By Expires   Discharge patient  As directed    Face-to-face encounter (required for Medicare/Medicaid patients)  As directed    Comments:     I Lexandra Rettke L certify that this patient is under my care and that I, or a nurse practitioner or physician's assistant working with me, had a face-to-face encounter that meets the physician face-to-face encounter requirements with this patient on 07/26/2013. The encounter with the patient was in whole, or in part for the following medical condition(s) which is the primary reason for home health care (List medical condition): Altered mental status/failure to thrive   Questions:     The encounter with the patient was in whole, or in part, for the following medical condition, which is the primary reason for home health care:  altered mental status/failure to thrive   I certify that, based on my findings, the following services are medically necessary home health services:   Nursing   Physical therapy   My clinical findings support the need for the above services:  Cognitive impairments, dementia, or mental confusion  that make it unsafe to leave home   Further, I certify that my clinical findings support that this patient is homebound due to:  Mental confusion   Reason for Medically Necessary Home Health Services:  Skilled Nursing- Change/Decline in Patient Status   Home Health  As directed    Questions:     To provide the following care/treatments:  PT   RN   Home Health Aide   Social work        Signed: Tommy Rainwater [phone]  07/26/2013, 8:43 AM

## 2013-08-01 ENCOUNTER — Telehealth: Payer: Self-pay | Admitting: Orthopedic Surgery

## 2013-08-01 NOTE — Telephone Encounter (Signed)
Patient called, spoke with [name] initially, to request appointment for hip and back pain.  States right hip has started up hurting.  Per last office note 05/22/13, patient was evaluated for left hip and back pain, and it appears that all areas of hips and back pain were addressed, with no follow up to be scheduled here.  Neurosurgeon? Please advise, as patient feels that she wanted to come back here.  Patient [phone]

## 2013-08-02 NOTE — Telephone Encounter (Signed)
Assessment the patient is not a candidate for surgery at this time based on her medical problems and coronary artery disease which includes congestive heart failure. If she can be medically optimized she may be a candidate at a tertiary care facility. I think she will also need further workup for her lumbar spine spinal stenosis with referral back to Dr. Newell Coral who did her spine fusion

## 2013-08-06 NOTE — Telephone Encounter (Signed)
Spoke with patient and advised per Dr. Mort Sawyers response.  (Recent Office visit notes, phone note have been faxed to primary care, Dr. Juanetta Gosling, and to Dr. Newell Coral.

## 2013-08-14 ENCOUNTER — Ambulatory Visit: Payer: PRIVATE HEALTH INSURANCE | Admitting: Orthopedic Surgery

## 2013-09-19 IMAGING — CR DG CHEST 2V
2 series · 2 of 2 positions shown · non-contrast
Comparison: 11/18/2011 and earlier.

CLINICAL DATA: 70-year -old female with shortness of breath and
weakness.

CHEST - 2 VIEW

[w chest pa]
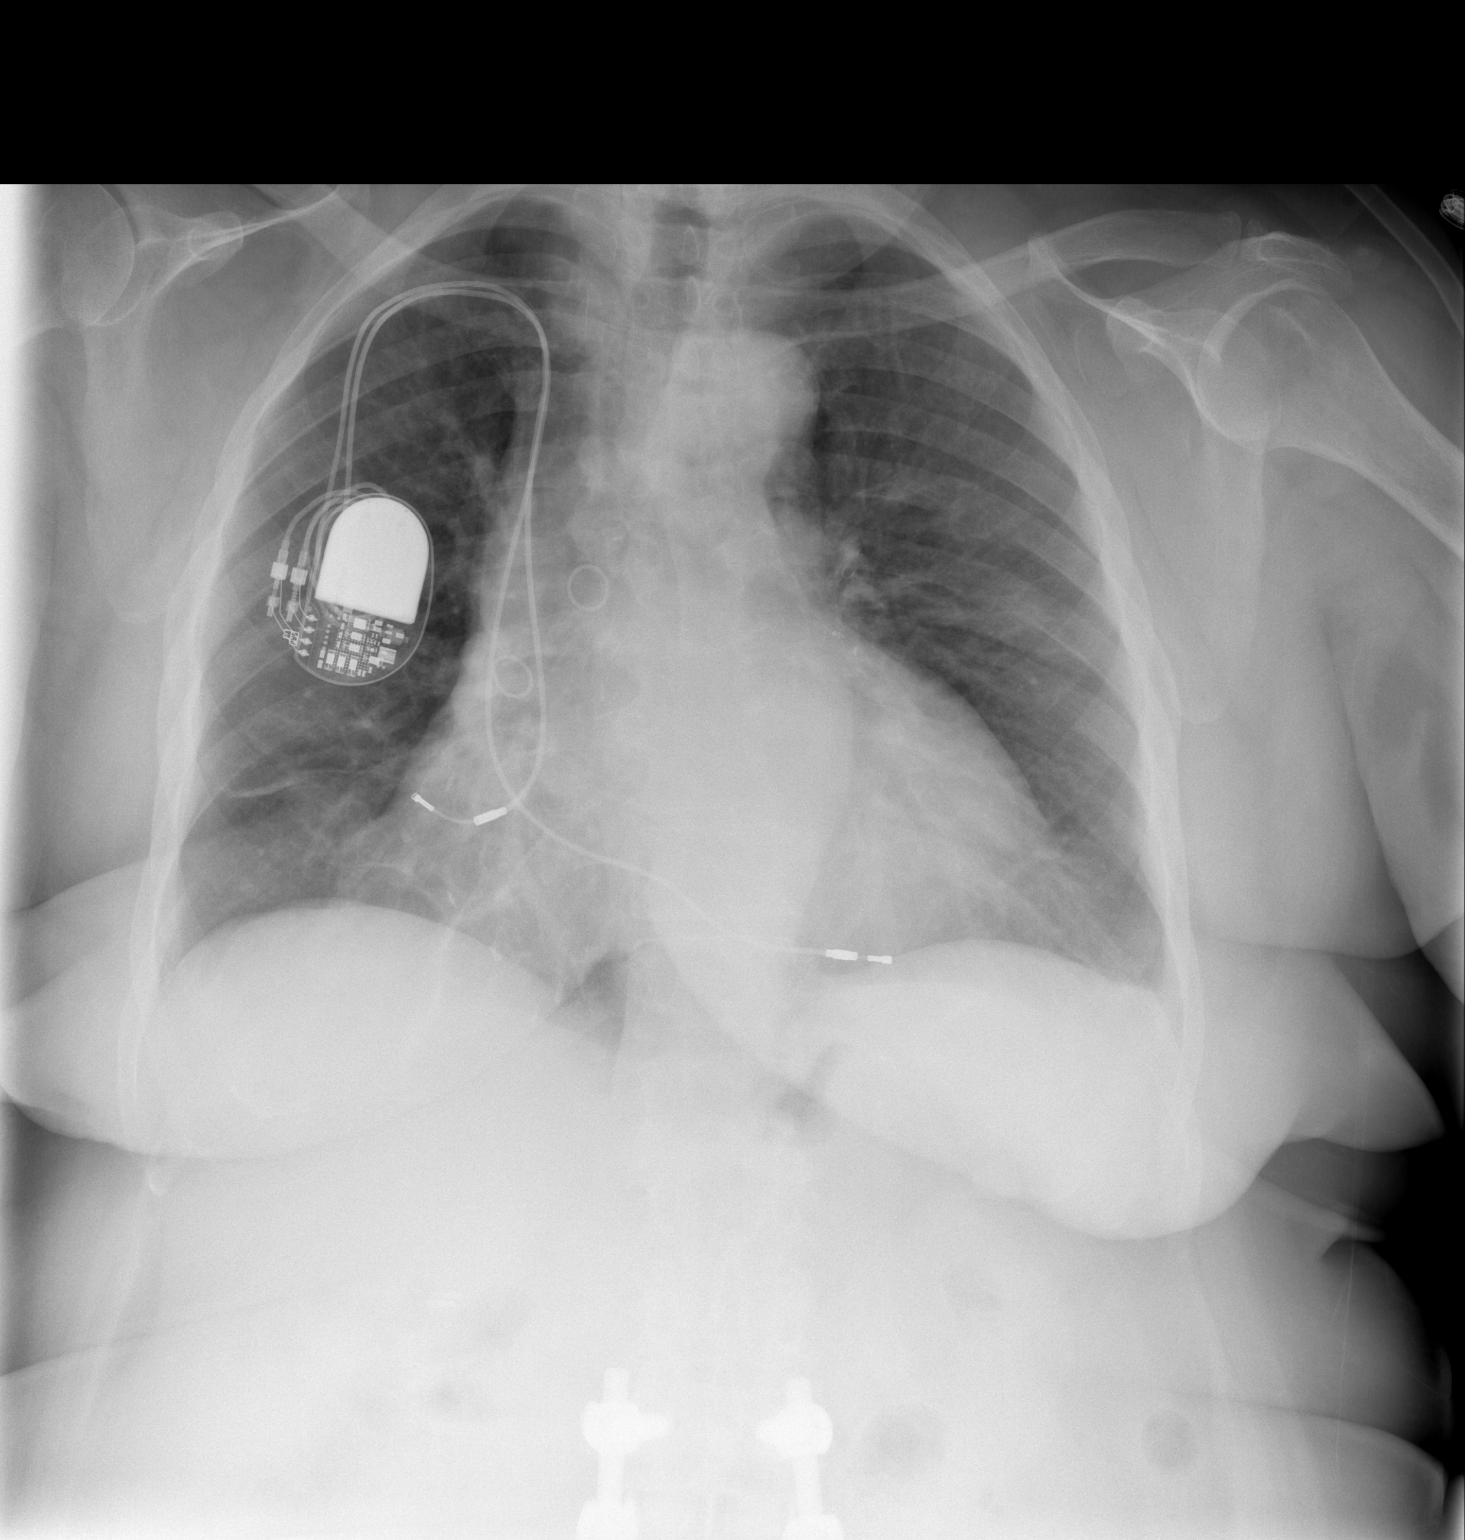

[w chest lat]
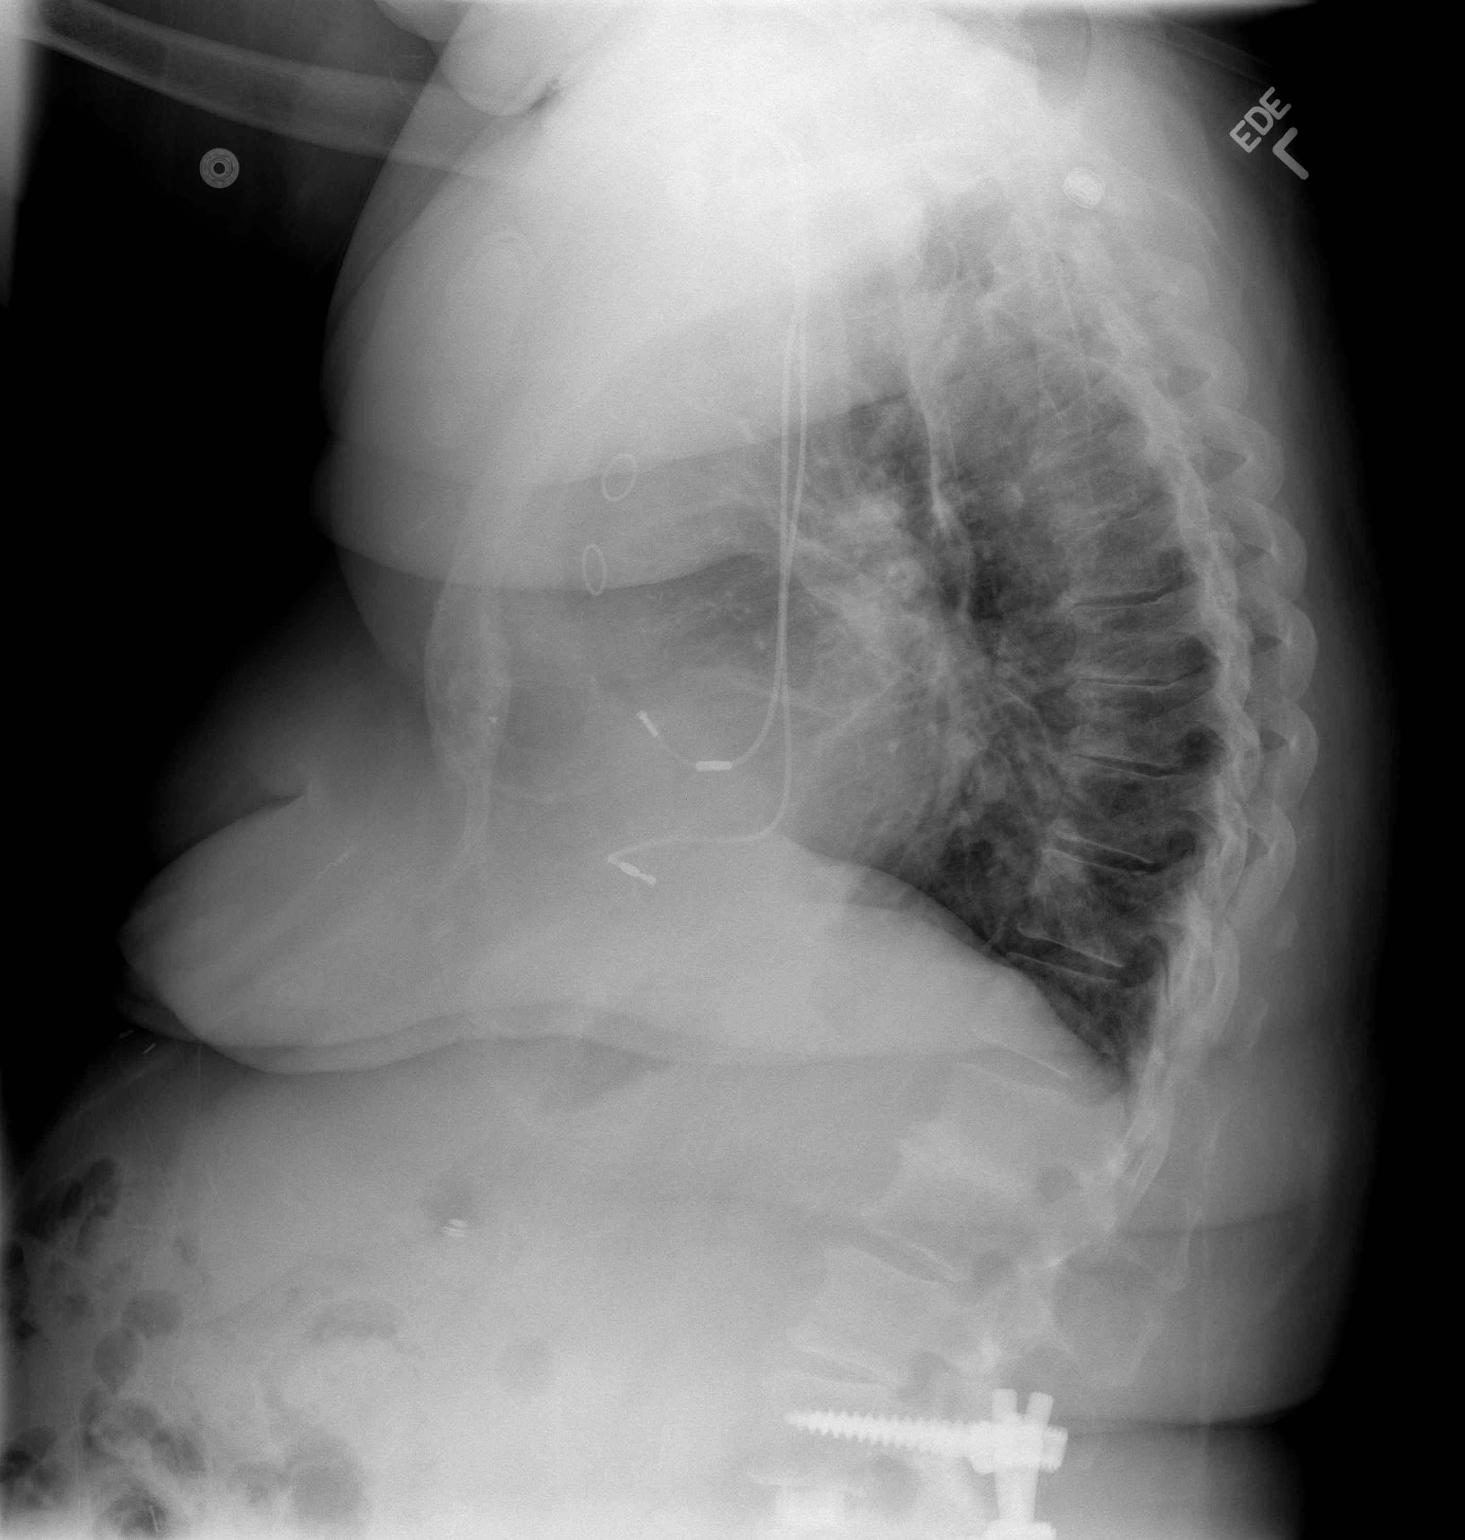

[2 of 2 positions shown; findings below may reference images not displayed]

FINDINGS: Stable cardiomegaly and mediastinal contours.  Stable
sequelae of CABG.  Stable right chest dual lumen cardiac pacemaker.
No pneumothorax, pulmonary edema, pleural effusion or confluent
pulmonary opacity.  Partially visualized lumbar fusion hardware. No
acute osseous abnormality identified.
IMPRESSION: No acute cardiopulmonary abnormality.

## 2013-09-30 ENCOUNTER — Encounter (HOSPITAL_COMMUNITY): Payer: Self-pay | Admitting: Emergency Medicine

## 2013-09-30 ENCOUNTER — Telehealth: Payer: Self-pay | Admitting: *Deleted

## 2013-09-30 ENCOUNTER — Emergency Department (HOSPITAL_COMMUNITY): Payer: PRIVATE HEALTH INSURANCE

## 2013-09-30 ENCOUNTER — Observation Stay (HOSPITAL_COMMUNITY)
Admission: EM | Admit: 2013-09-30 | Discharge: 2013-10-02 | Disposition: A | Discharge status: Home / Self Care | Payer: PRIVATE HEALTH INSURANCE | Attending: Cardiology | Admitting: Cardiology

## 2013-09-30 DIAGNOSIS — G40909 Epilepsy, unspecified, not intractable, without status epilepticus: Secondary | ICD-10-CM | POA: Insufficient documentation

## 2013-09-30 DIAGNOSIS — R0789 Other chest pain: Principal | ICD-10-CM | POA: Insufficient documentation

## 2013-09-30 DIAGNOSIS — I10 Essential (primary) hypertension: Secondary | ICD-10-CM | POA: Insufficient documentation

## 2013-09-30 DIAGNOSIS — Z86718 Personal history of other venous thrombosis and embolism: Secondary | ICD-10-CM | POA: Insufficient documentation

## 2013-09-30 DIAGNOSIS — E669 Obesity, unspecified: Secondary | ICD-10-CM | POA: Insufficient documentation

## 2013-09-30 DIAGNOSIS — G4730 Sleep apnea, unspecified: Secondary | ICD-10-CM | POA: Insufficient documentation

## 2013-09-30 DIAGNOSIS — Z950 Presence of cardiac pacemaker: Secondary | ICD-10-CM | POA: Insufficient documentation

## 2013-09-30 DIAGNOSIS — I2510 Atherosclerotic heart disease of native coronary artery without angina pectoris: Secondary | ICD-10-CM | POA: Insufficient documentation

## 2013-09-30 DIAGNOSIS — I4430 Unspecified atrioventricular block: Secondary | ICD-10-CM | POA: Insufficient documentation

## 2013-09-30 DIAGNOSIS — I5032 Chronic diastolic (congestive) heart failure: Secondary | ICD-10-CM | POA: Diagnosis present

## 2013-09-30 DIAGNOSIS — R079 Chest pain, unspecified: Secondary | ICD-10-CM

## 2013-09-30 DIAGNOSIS — IMO0002 Reserved for concepts with insufficient information to code with codable children: Secondary | ICD-10-CM | POA: Insufficient documentation

## 2013-09-30 DIAGNOSIS — E785 Hyperlipidemia, unspecified: Secondary | ICD-10-CM | POA: Insufficient documentation

## 2013-09-30 DIAGNOSIS — E119 Type 2 diabetes mellitus without complications: Secondary | ICD-10-CM | POA: Insufficient documentation

## 2013-09-30 DIAGNOSIS — K219 Gastro-esophageal reflux disease without esophagitis: Secondary | ICD-10-CM | POA: Insufficient documentation

## 2013-09-30 DIAGNOSIS — Z951 Presence of aortocoronary bypass graft: Secondary | ICD-10-CM | POA: Insufficient documentation

## 2013-09-30 DIAGNOSIS — R072 Precordial pain: Secondary | ICD-10-CM

## 2013-09-30 DIAGNOSIS — R112 Nausea with vomiting, unspecified: Secondary | ICD-10-CM | POA: Insufficient documentation

## 2013-09-30 DIAGNOSIS — Z888 Allergy status to other drugs, medicaments and biological substances status: Secondary | ICD-10-CM | POA: Insufficient documentation

## 2013-09-30 DIAGNOSIS — E43 Unspecified severe protein-calorie malnutrition: Secondary | ICD-10-CM | POA: Insufficient documentation

## 2013-09-30 DIAGNOSIS — Z7982 Long term (current) use of aspirin: Secondary | ICD-10-CM | POA: Insufficient documentation

## 2013-09-30 DIAGNOSIS — Z79899 Other long term (current) drug therapy: Secondary | ICD-10-CM | POA: Insufficient documentation

## 2013-09-30 DIAGNOSIS — J441 Chronic obstructive pulmonary disease with (acute) exacerbation: Secondary | ICD-10-CM | POA: Insufficient documentation

## 2013-09-30 HISTORY — DX: Migraine, unspecified, not intractable, without status migrainosus: G43.909

## 2013-09-30 HISTORY — DX: Unspecified osteoarthritis, unspecified site: M19.90

## 2013-09-30 HISTORY — DX: Low back pain: M54.5

## 2013-09-30 HISTORY — DX: Low back pain, unspecified: M54.50

## 2013-09-30 HISTORY — DX: Personal history of other diseases of the digestive system: Z87.19

## 2013-09-30 HISTORY — DX: Unspecified asthma, uncomplicated: J45.909

## 2013-09-30 HISTORY — DX: Other chronic pain: G89.29

## 2013-09-30 HISTORY — DX: Peptic ulcer, site unspecified, unspecified as acute or chronic, without hemorrhage or perforation: K27.9

## 2013-09-30 HISTORY — DX: Orthopnea: R06.01

## 2013-09-30 HISTORY — DX: Urge incontinence: N39.41

## 2013-09-30 HISTORY — DX: Presence of cardiac pacemaker: Z95.0

## 2013-09-30 LAB — CBC
HCT: 30.3 % — ABNORMAL LOW (ref 36.0–46.0)
Platelets: 195 10*3/uL (ref 150–400)
RDW: 14.7 % (ref 11.5–15.5)
WBC: 8.5 10*3/uL (ref 4.0–10.5)

## 2013-09-30 LAB — GLUCOSE, CAPILLARY
Glucose-Capillary: 87 mg/dL (ref 70–99)
Glucose-Capillary: 115 mg/dL — ABNORMAL HIGH (ref 70–99)

## 2013-09-30 LAB — BASIC METABOLIC PANEL
BUN: 48 mg/dL — ABNORMAL HIGH (ref 6–23)
Chloride: 103 mEq/L (ref 96–112)
GFR calc Af Amer: 25 mL/min — ABNORMAL LOW (ref >90)
Potassium: 4 mEq/L (ref 3.5–5.1)

## 2013-09-30 LAB — TROPONIN I
Troponin I: <0.3 ng/mL (ref <0.30)
Troponin I: <0.3 ng/mL (ref <0.30)

## 2013-09-30 LAB — OCCULT BLOOD, POC DEVICE: Fecal Occult Bld: NEGATIVE

## 2013-09-30 LAB — HEMOGLOBIN A1C
Hgb A1c MFr Bld: 6.2 % — ABNORMAL HIGH (ref <5.7)
Mean Plasma Glucose: 131 mg/dL — ABNORMAL HIGH (ref <117)

## 2013-09-30 MED ORDER — INSULIN ASPART 100 UNIT/ML ~~LOC~~ SOLN: 0.0000 [IU] | Freq: Three times a day (TID) | SUBCUTANEOUS | Status: DC

## 2013-09-30 MED ORDER — OMEGA-3-ACID ETHYL ESTERS 1 G PO CAPS
1.0000 g | ORAL_CAPSULE | Freq: Three times a day (TID) | ORAL | Status: DC
  Administered 2013-09-30 – 2013-10-01 (×5): 1 g via ORAL
  Filled 2013-09-30 (×8): qty 1

## 2013-09-30 MED ORDER — LEVETIRACETAM 500 MG PO TABS
500.0000 mg | ORAL_TABLET | Freq: Two times a day (BID) | ORAL | Status: DC
  Administered 2013-09-30 – 2013-10-01 (×3): 500 mg via ORAL
  Filled 2013-09-30 (×6): qty 1

## 2013-09-30 MED ORDER — KETOROLAC TROMETHAMINE 0.5 % OP SOLN
1.0000 [drp] | Freq: Three times a day (TID) | OPHTHALMIC | Status: DC
  Administered 2013-09-30 – 2013-10-01 (×4): 1 [drp] via OPHTHALMIC
  Filled 2013-09-30 (×2): qty 3

## 2013-09-30 MED ORDER — ACETAMINOPHEN 325 MG PO TABS
650.0000 mg | ORAL_TABLET | Freq: Once | ORAL | Status: AC
  Administered 2013-09-30: 650 mg via ORAL
  Filled 2013-09-30: qty 2

## 2013-09-30 MED ORDER — ASPIRIN EC 81 MG PO TBEC
81.0000 mg | DELAYED_RELEASE_TABLET | Freq: Every day | ORAL | Status: DC
  Administered 2013-10-01: 81 mg via ORAL
  Filled 2013-09-30 (×3): qty 1

## 2013-09-30 MED ORDER — ALPRAZOLAM 0.25 MG PO TABS: 0.2500 mg | ORAL_TABLET | Freq: Two times a day (BID) | ORAL | Status: DC | PRN

## 2013-09-30 MED ORDER — SODIUM CHLORIDE 0.9 % IJ SOLN
10.0000 mL | INTRAMUSCULAR | Status: DC | PRN
  Administered 2013-09-30 – 2013-10-02 (×4): 10 mL

## 2013-09-30 MED ORDER — METOPROLOL TARTRATE 25 MG PO TABS
25.0000 mg | ORAL_TABLET | Freq: Every morning | ORAL | Status: DC
  Administered 2013-10-01: 25 mg via ORAL
  Filled 2013-09-30 (×2): qty 1

## 2013-09-30 MED ORDER — ATORVASTATIN CALCIUM 40 MG PO TABS
40.0000 mg | ORAL_TABLET | Freq: Every day | ORAL | Status: DC
  Administered 2013-09-30 – 2013-10-01 (×2): 40 mg via ORAL
  Filled 2013-09-30 (×4): qty 1

## 2013-09-30 MED ORDER — ACETAMINOPHEN 325 MG PO TABS
650.0000 mg | ORAL_TABLET | ORAL | Status: DC | PRN
  Administered 2013-10-01: 650 mg via ORAL
  Filled 2013-09-30 (×2): qty 2

## 2013-09-30 MED ORDER — INSULIN ASPART 100 UNIT/ML ~~LOC~~ SOLN: 0.0000 [IU] | Freq: Every day | SUBCUTANEOUS | Status: DC

## 2013-09-30 MED ORDER — SODIUM CHLORIDE 0.9 % IJ SOLN: 10.0000 mL | Freq: Two times a day (BID) | INTRAMUSCULAR | Status: DC

## 2013-09-30 MED ORDER — POTASSIUM CHLORIDE CRYS ER 10 MEQ PO TBCR
10.0000 meq | EXTENDED_RELEASE_TABLET | Freq: Every day | ORAL | Status: DC
  Administered 2013-10-01: 10 meq via ORAL
  Filled 2013-09-30 (×2): qty 1

## 2013-09-30 MED ORDER — ZOLPIDEM TARTRATE 5 MG PO TABS: 5.0000 mg | ORAL_TABLET | Freq: Every evening | ORAL | Status: DC | PRN

## 2013-09-30 MED ORDER — FUROSEMIDE 40 MG PO TABS
40.0000 mg | ORAL_TABLET | Freq: Two times a day (BID) | ORAL | Status: DC
  Administered 2013-09-30 – 2013-10-01 (×3): 40 mg via ORAL
  Filled 2013-09-30 (×4): qty 1

## 2013-09-30 MED ORDER — ASPIRIN 81 MG PO CHEW: 324.0000 mg | CHEWABLE_TABLET | Freq: Once | ORAL | Status: DC

## 2013-09-30 MED ORDER — PANTOPRAZOLE SODIUM 40 MG PO TBEC
40.0000 mg | DELAYED_RELEASE_TABLET | Freq: Every day | ORAL | Status: DC
  Administered 2013-10-01: 40 mg via ORAL
  Filled 2013-09-30 (×2): qty 1

## 2013-09-30 MED ORDER — SENNOSIDES-DOCUSATE SODIUM 8.6-50 MG PO TABS
1.0000 | ORAL_TABLET | Freq: Every day | ORAL | Status: DC | PRN
  Filled 2013-09-30: qty 1

## 2013-09-30 MED ORDER — NITROGLYCERIN 2 % TD OINT
1.0000 [in_us] | TOPICAL_OINTMENT | Freq: Once | TRANSDERMAL | Status: AC
  Administered 2013-09-30: 1 [in_us] via TOPICAL
  Filled 2013-09-30: qty 1

## 2013-09-30 MED ORDER — ONDANSETRON HCL 4 MG/2ML IJ SOLN: 4.0000 mg | Freq: Four times a day (QID) | INTRAMUSCULAR | Status: DC | PRN

## 2013-09-30 MED ORDER — OXYBUTYNIN CHLORIDE ER 10 MG PO TB24
10.0000 mg | ORAL_TABLET | Freq: Every day | ORAL | Status: DC
  Administered 2013-09-30 – 2013-10-01 (×2): 10 mg via ORAL
  Filled 2013-09-30 (×3): qty 1

## 2013-09-30 MED ORDER — DOXEPIN HCL 10 MG PO CAPS
10.0000 mg | ORAL_CAPSULE | Freq: Every day | ORAL | Status: DC
  Administered 2013-09-30 – 2013-10-01 (×2): 10 mg via ORAL
  Filled 2013-09-30 (×4): qty 1

## 2013-09-30 MED ORDER — ENOXAPARIN SODIUM 40 MG/0.4ML ~~LOC~~ SOLN
40.0000 mg | SUBCUTANEOUS | Status: DC
  Administered 2013-09-30 – 2013-10-01 (×2): 40 mg via SUBCUTANEOUS
  Filled 2013-09-30 (×3): qty 0.4

## 2013-09-30 MED ORDER — BENAZEPRIL HCL 40 MG PO TABS
40.0000 mg | ORAL_TABLET | Freq: Every day | ORAL | Status: DC
  Administered 2013-10-01: 40 mg via ORAL
  Filled 2013-09-30 (×3): qty 1

## 2013-09-30 MED ORDER — GI COCKTAIL ~~LOC~~
30.0000 mL | Freq: Four times a day (QID) | ORAL | Status: DC | PRN
  Administered 2013-10-01: 30 mL via ORAL
  Filled 2013-09-30: qty 30

## 2013-09-30 NOTE — ED Provider Notes (Signed)
CSN: [id number]     Arrival date & time 09/30/13  1102 History   First MD Initiated Contact with Patient 09/30/13 1114     Chief Complaint  Patient presents with  . Chest Pain   (Consider location/radiation/quality/duration/timing/severity/associated sxs/prior Treatment) HPI Comments: 72 year old female presents with 3 days of intermittent chest pain. States that chest pain comes at random times but is very frequent. She states it lasts for over 15 minutes at a time. Feels like a squeezing and heaviness. It radiates up her neck and causes shortness of breath and nausea and vomiting. She has had pain like this before and has a remote history of a CABG. She was at her doctor's office for regular back care and then they gave her nitroglycerin and sent her here. She is currently pain free.   Past Medical History  Diagnosis Date  . Diabetes mellitus, type II   . Hypertension   . Arteriosclerotic cardiovascular disease (ASCVD)     multivessel, CABG 6/10.  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  Myoview (11/20/11) showed no ischemia & EF 62%  . Gastroesophageal reflux disease     Hiatal hernia  . Hyperlipidemia   . Deep vein thrombosis, upper right extremity     post-PPM  . Obesity   . DDD (degenerative disc disease)   . Dehiscence of closure of sternum or sternotomy 07/2009    sternal infection; required flap closure  . COPD (chronic obstructive pulmonary disease)     severe lung disease by PFTs 6/12  . Sleep apnea   . Seizure disorder   . 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 Pacemaker placement  1993    Status post DDD pacemaker implantation in 1993, with Medtronic Kappa generator change in 2003 for  second-degree AV block, most recent gen change by Fawn Kirk 04/14/11  . Revision total hip arthroplasty    . Replacement total knee      Left  . Thyroid surgery    . Wrist surgery    . Coronary artery bypass  graft  05/1999 and    - LIMA to LAD, SVG to OM, SVG to PDA  . Reconstructive repair sternal      for infection s/p CABG 8/10  . Back surgery    . Esophagogastroduodenoscopy      with [facility] dilation,  biopsy, and disruption Schatzki's ring  . Colonoscopy  04/27/2005    hemorrhoids, diverticula  . Esophagogastroduodenoscopy  09/10/2012    RMR: Noncritical Schatzki's ring. Diffuse gastric erosions and an  area of partially healing ulceration-status post biopsy/ Small hiatal hernia. Bx reactive gastropathy.  . Abdominal hysterectomy    . Colonoscopy with esophagogastroduodenoscopy (egd) N/A 02/20/2013    RMR: pancolonic diverticulosis and internal hemorrhoids. EGD with small hiatal hernia, healed gastric ulcer  . Portacath placement Left 07/17/2013    Procedure: INSERTION PORT-A-CATH-left subclavian;  Surgeon: Fabio Bering, MD;  Location: [facility];  Service: General;  Laterality: Left;   Family History  Problem Relation Age of Onset  . Diabetes    . Heart disease Mother     MI  . Coronary artery disease      Female <55  . Cancer    . Colon cancer Father     age 7   History  Substance Use Topics  . Smoking status: Never Smoker   . Smokeless tobacco: Never Used  . Alcohol Use: No  OB History   Grav Para Term Preterm Abortions TAB SAB Ect Mult Living                 Review of Systems  Constitutional: Negative for fever.  Respiratory: Positive for shortness of breath.   Cardiovascular: Positive for chest pain. Negative for leg swelling.  Gastrointestinal: Positive for nausea and vomiting. Negative for abdominal pain and abdominal distention.  All other systems reviewed and are negative.    Allergies  Codeine; Other; Oxycodone hcl; and Reglan  Home Medications   Current Outpatient Rx  Name  Route  Sig  Dispense  Refill  . amLODipine (NORVASC) 10 MG tablet   Oral   Take 10 mg by mouth daily.         Marland Kitchen aspirin EC 81 MG tablet   Oral   Take 81 mg by mouth daily.          Marland Kitchen atorvastatin (LIPITOR) 40 MG tablet   Oral   Take 40 mg by mouth daily.         . benazepril (LOTENSIN) 40 MG tablet   Oral   Take 1 tablet (40 mg total) by mouth daily.   30 tablet   12   . Bromfenac Sodium (BROMDAY) 0.09 % SOLN   Right Eye   Place 1 drop into the right eye 3 (three) times daily.         Marland Kitchen doxepin (SINEQUAN) 10 MG capsule   Oral   Take 1 capsule (10 mg total) by mouth at bedtime.   30 capsule   12   . estradiol (ESTRACE) 0.5 MG tablet   Oral   Take 0.5 mg by mouth daily.           . hydrALAZINE (APRESOLINE) 25 MG tablet   Oral   Take 25 mg by mouth every 8 (eight) hours.         Marland Kitchen ketorolac (ACULAR) 0.5 % ophthalmic solution   Right Eye   Place 1 drop into the right eye 3 (three) times daily.   5 mL   0   . levETIRAcetam (KEPPRA) 500 MG tablet   Oral   Take 1 tablet (500 mg total) by mouth 2 (two) times daily.   60 tablet   12   . magnesium hydroxide (MILK OF MAGNESIA) 400 MG/5ML suspension      Takes couple of tablespoons daily.         . metFORMIN (GLUCOPHAGE) 500 MG tablet   Oral   Take 500 mg by mouth 2 (two) times daily with a meal.          . metoprolol tartrate (LOPRESSOR) 25 MG tablet   Oral   Take 12.5 mg by mouth 2 (two) times daily.         . nitroGLYCERIN (NITROSTAT) 0.4 MG SL tablet   Sublingual   Place 0.4 mg under the tongue every 5 (five) minutes as needed for chest pain.          Marland Kitchen ondansetron (ZOFRAN) 4 MG tablet   Oral   Take 1 tablet (4 mg total) by mouth every 8 (eight) hours as needed.   20 tablet   0   . pantoprazole (PROTONIX) 40 MG tablet   Oral   Take 40 mg by mouth 2 (two) times daily.         . potassium chloride (K-DUR) 10 MEQ tablet   Oral   Take 2 tablets (20 mEq total) by  mouth daily.   60 tablet   3   . senna-docusate (SENOKOT-S) 8.6-50 MG per tablet   Oral   Take 1 tablet by mouth daily as needed for constipation.           BP 130/72  Pulse 62  Temp(Src) 98.8  F (37.1 C) (Oral)  Resp 22  Ht 5\' 2"  (1.575 m)  SpO2 98% Physical Exam  Vitals reviewed. Constitutional: She is oriented to person, place, and time. She appears well-developed and well-nourished. No distress.  HENT:  Head: Normocephalic and atraumatic.  Right Ear: External ear normal.  Left Ear: External ear normal.  Nose: Nose normal.  Eyes: Right eye exhibits no discharge. Left eye exhibits no discharge.  Cardiovascular: Normal rate, regular rhythm and normal heart sounds.   Pulmonary/Chest: Effort normal and breath sounds normal. She exhibits no tenderness.  Abdominal: Soft. She exhibits no distension. There is no tenderness.  Neurological: She is alert and oriented to person, place, and time.  Skin: Skin is warm and dry.    ED Course  Procedures (including critical care time) Labs Review Labs Reviewed  CBC - Abnormal; Notable for the following:    RBC 3.47 (*)    Hemoglobin 9.9 (*)    HCT 30.3 (*)    All other components within normal limits  BASIC METABOLIC PANEL - Abnormal; Notable for the following:    CO2 18 (*)    BUN 48 (*)    Creatinine, Ser 2.17 (*)    GFR calc non Af Amer 21 (*)    GFR calc Af Amer 25 (*)    All other components within normal limits  TROPONIN I  GLUCOSE, CAPILLARY  TROPONIN I  TROPONIN I  HEMOGLOBIN A1C  POCT I-STAT TROPONIN I  OCCULT BLOOD, POC DEVICE   Imaging Review No results found.  EKG Interpretation     Ventricular Rate:  61 PR Interval:  197 QRS Duration: 150 QT Interval:  469 QTC Calculation: 472 R Axis:   -80 Text Interpretation:  Sinus rhythm LVH with IVCD, LAD and secondary repol abnrm New left bundle branch block Changes noted from most recent on 07/22/13            MDM   1. Precordial pain   2. Chest pain    Patient was chest pain-free on arrival. She seemed to have a new left bundle on my evaluation and thus I called cardiology. Cardiology saw patient and felt that it was her pacemaker pacing her  causing her to have a wide complex rhythm. He feels it is not a new left undle and she's had a pacer in for 20 years. Cardiology will admit the patient for serial troponins and further ACS workup. She was given aspirin in the clinic prior to arrival.    Audree Camel, MD 09/30/13 1635

## 2013-09-30 NOTE — ED Notes (Signed)
Dr. Goldston at bedside.  

## 2013-09-30 NOTE — Telephone Encounter (Signed)
Received a call from Dr Eduard Clos office, pt having chest pain in office. Nurse sent EKG stated pt had some ischemia. This nurse took the EKG to [facility] and told nurse of Dr Eduard Clos office to go ahead and send pt to the ED at [facility].

## 2013-09-30 NOTE — ED Notes (Signed)
Pt sent from doctor's office with c/o central upper CP radiating to neck,intermittent since yesterday morning. Pt was given 1SL nitro at the MD office and 324mg  ASA by EMS and pt is now CP free. Pt calm, cooperative, and alert, in NAD at this time

## 2013-09-30 NOTE — H&P (Signed)
HPI: 72 year old female for evaluation of chest pain. Patient is status post coronary artery bypassing graft in June of 2010. Most recent nuclear study in December of 2012 showed an ejection fraction of 62% and no ischemia. Echocardiogram in July of 2014 showed normal LV function, grade 1 diastolic dysfunction, trace mitral regurgitation and tricuspid regurgitation. Patient went to see a physician concerning back pain today. She complained of chest pain in the office and she was transferred to the emergency room. She is presently pain-free. The patient states she has had occasional chest pain since her bypass surgery. It is worse over the past several months. It is described as a swelling in her upper chest radiating to her neck. It lasts 15-30 minutes and resolves spontaneously. It is not pleuritic, positional or related to food. It occurs both with exertion and at rest. There is associated nausea, dyspnea and diaphoresis. She has chronic dyspnea on exertion. There is question of orthopnea and pedal edema.   (Not in a hospital admission)  Allergies  Allergen Reactions  . Codeine     "good" headache  . Other     Leafy raw vegetables and seeds  . Oxycodone Hcl     "good" headache; also OxyContin  . Reglan [Metoclopramide]     CONFUSION    Past Medical History  Diagnosis Date  . Diabetes mellitus, type II   . Hypertension   . CAD (coronary artery disease)     multivessel, CABG 6/10.  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  Myoview (11/20/11) showed no ischemia & EF 62%  . Gastroesophageal reflux disease     Hiatal hernia  . Hyperlipidemia   . Deep vein thrombosis, upper right extremity     post-PPM  . Obesity   . DDD (degenerative disc disease)   . Dehiscence of closure of sternum or sternotomy 07/2009    sternal infection; required flap closure  . COPD (chronic obstructive pulmonary disease)     severe lung disease by PFTs 6/12  . Sleep apnea   . Seizure disorder   . 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 Pacemaker placement  1993    Status post DDD pacemaker implantation in 1993, with Medtronic Kappa generator change in 2003 for  second-degree AV block, most recent gen change by Fawn Kirk 04/14/11  . Revision total hip arthroplasty    . Replacement total knee      Left  . Thyroid surgery    . Wrist surgery    . 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 Reconstructive repair sternal      for infection s/p CABG 8/10  . Back surgery    . Esophagogastroduodenoscopy      with [facility] dilation,  biopsy, and disruption Schatzki's ring  . Colonoscopy  04/27/2005    hemorrhoids, diverticula  . Esophagogastroduodenoscopy  09/10/2012    RMR: Noncritical Schatzki's ring. Diffuse gastric erosions and an  area of partially healing ulceration-status post biopsy/ Small hiatal hernia. Bx reactive gastropathy.  . Abdominal hysterectomy    . Colonoscopy with esophagogastroduodenoscopy (egd) N/A 02/20/2013    RMR: pancolonic diverticulosis and internal hemorrhoids. EGD with small hiatal hernia, healed gastric ulcer  . Portacath placement Left 07/17/2013    Procedure: INSERTION PORT-A-CATH-left subclavian;  Surgeon: Fabio Bering, MD;  Location:  [facility];  Service: General;  Laterality: Left;    History   Social History  . Marital Status: Married    Spouse Name: N/A    Number of Children: 5  . Years of Education: N/A   Occupational History  .     Social History Main Topics  . Smoking status: Never Smoker   . Smokeless tobacco: Never Used  . Alcohol Use: No  . Drug Use: No  . Sexual Activity: Not Currently    Birth Control/ Protection: Post-menopausal, Surgical   Other Topics Concern  . Not on file   Social History Narrative  . No narrative on file    Family History  Problem Relation Age of Onset  . Diabetes    . Heart disease Mother      MI  . Coronary artery disease      Female <55  . Cancer    . Colon cancer Father     age 34    ROS:  Question black stool but no fevers or chills, productive cough, hemoptysis, dysphasia, odynophagia, hematochezia, dysuria, hematuria, rash, seizure activity, orthopnea, PND, pedal edema, claudication. Remaining systems are negative.  Physical Exam:   Blood pressure 130/72, pulse 62, temperature 98.8 F (37.1 C), temperature source Oral, resp. rate 22, height 5\' 2"  (1.575 m), SpO2 98.00%.  General:  Well developed/well nourished in NAD Skin warm/dry Patient not depressed No peripheral clubbing Back-normal HEENT-normal/normal eyelids Neck supple/normal carotid upstroke bilaterally; no bruits; no JVD; no thyromegaly chest - CTA/ normal expansion, pacemaker right chest, Port-A-Cath left chest. Previous sternotomy. CV - RRR/normal S1 and S2; no rubs or gallops;  PMI nondisplaced, 2/6 systolic murmur left sternal border. Abdomen -NT/ND, no HSM, no mass, + bowel sounds, no bruit Rectal-no stool in vault; brown mucous  2+ femoral pulses, no bruits Ext-no edema, chords, 2+ DP Neuro-grossly nonfocal  ECG AV pacing.  Dg Chest Portable 1 View  09/30/2013   CLINICAL DATA:  Chest pain  EXAM: PORTABLE CHEST - 1 VIEW  COMPARISON:  07/22/2013  FINDINGS: Cardiac shadow remains enlarged. Postsurgical changes are seen. A left-sided chest wall port is again noted. A pacing device is again seen. The lungs are clear bilaterally.  IMPRESSION: No acute abnormality noted.   Electronically Signed   By: Alcide Clever M.D.   On: 09/30/2013 12:00    Assessment/Plan 1 chest pain-patient is a somewhat poor historian. She has had intermittent chest pain since bypass surgery but more severe recently. Electrocardiogram shows AV pacing. She is presently pain-free. Plan to admit and rule out myocardial infarction with serial enzymes. If negative plan nuclear study tomorrow morning. Note patient describes question  melena. Hemoglobin is improved compared to most recent in August. Rectal exam no stool in vault and brown mucus. Hemoccult negative. 2 coronary artery disease-continue aspirin and statin. 3 chronic diastolic congestive heart failure-continue present dose of Lasix. Laboratories pending. 4 diabetes mellitus-continue present medications. Follow CBGs. 5 hypertension-continue present blood pressure medications. 6 hyperlipidemia-continue statin. 7 history of seizure disorder-continue preadmission medications. 8 pacemaker  Olga Millers MD 09/30/2013, 12:06 PM

## 2013-09-30 NOTE — Progress Notes (Signed)
UR Completed Malayah Demuro Graves-Bigelow, RN,BSN [phone]  

## 2013-09-30 NOTE — ED Notes (Signed)
Cardiology at bedside.

## 2013-10-01 ENCOUNTER — Observation Stay (HOSPITAL_COMMUNITY): Payer: PRIVATE HEALTH INSURANCE

## 2013-10-01 LAB — GLUCOSE, CAPILLARY
Glucose-Capillary: 104 mg/dL — ABNORMAL HIGH (ref 70–99)
Glucose-Capillary: 100 mg/dL — ABNORMAL HIGH (ref 70–99)
Glucose-Capillary: 149 mg/dL — ABNORMAL HIGH (ref 70–99)

## 2013-10-01 LAB — TROPONIN I: Troponin I: <0.3 ng/mL (ref <0.30)

## 2013-10-01 MED ORDER — TECHNETIUM TC 99M SESTAMIBI GENERIC - CARDIOLITE
30.0000 | Freq: Once | INTRAVENOUS | Status: AC | PRN
  Administered 2013-10-01: 30 via INTRAVENOUS

## 2013-10-01 MED ORDER — GLUCERNA SHAKE PO LIQD
237.0000 mL | Freq: Two times a day (BID) | ORAL | Status: DC
  Administered 2013-10-01: 237 mL via ORAL

## 2013-10-01 MED ORDER — DICLOFENAC SODIUM 1 % TD GEL
2.0000 g | Freq: Four times a day (QID) | TRANSDERMAL | Status: DC
  Filled 2013-10-01: qty 100

## 2013-10-01 MED ORDER — TECHNETIUM TC 99M SESTAMIBI GENERIC - CARDIOLITE
10.0000 | Freq: Once | INTRAVENOUS | Status: AC | PRN
  Administered 2013-10-01: 10 via INTRAVENOUS

## 2013-10-01 MED ORDER — TRAMADOL HCL 50 MG PO TABS: 50.0000 mg | ORAL_TABLET | Freq: Two times a day (BID) | ORAL | Status: DC | PRN

## 2013-10-01 MED ORDER — REGADENOSON 0.4 MG/5ML IV SOLN
0.4000 mg | Freq: Once | INTRAVENOUS | Status: AC
  Administered 2013-10-01: 0.4 mg via INTRAVENOUS
  Filled 2013-10-01: qty 5

## 2013-10-01 MED ORDER — REGADENOSON 0.4 MG/5ML IV SOLN
INTRAVENOUS | Status: AC
  Administered 2013-10-01: 0.4 mg via INTRAVENOUS
  Filled 2013-10-01: qty 5

## 2013-10-01 NOTE — Progress Notes (Signed)
INITIAL NUTRITION ASSESSMENT  DOCUMENTATION CODES Per approved criteria  -Severe malnutrition in the context of chronic illness -Obesity Unspecified   INTERVENTION:  Glucerna Shake twice daily (220 kcals, 10 gm protein per 8 fl oz bottle) RD to follow for nutrition care plan  NUTRITION DIAGNOSIS: Inadequate oral intake related to poor appetite as evidenced by patient report  Goal: Pt to meet >/= 90% of their estimated nutrition needs   Monitor:  PO & supplemental intake, weight, labs, I/O's  Reason for Assessment: Malnutrition Screening Tool Report  72 y.o. female  Admitting Dx: chest pain  ASSESSMENT: Patient with PMH of DM, HTN, CAD s/p CABG and COPD; transferred from physician's office to ER due to chest pain; patient with associated nausea, dyspnea and diaphoresis.   Patient reports her appetite has been poor for several months and "just doesn't have a taste for food;" she grazes throughout the day vs sitting for meals; she reports severe weight loss as well -- per weight readings, she's had a 9% weight loss in the past month; likes Glucerna Shakes -- RD to order during hospitalization.  Patient meets criteria for severe malnutrition in the context of chronic illness as evidenced by < 75% intake of estimated energy requirement for > 1 month and 9% weight loss x 1 month.  Height: Ht Readings from Last 1 Encounters:  09/30/13 5\' 2"  (1.575 m)    Weight: Wt Readings from Last 1 Encounters:  09/30/13 179 lb 11.2 oz (81.511 kg)    Ideal Body Weight: 110 lb  % Ideal Body Weight: 162%  Wt Readings from Last 10 Encounters:  09/30/13 179 lb 11.2 oz (81.511 kg)  07/25/13 197 lb 14.4 oz (89.767 kg)  07/18/13 208 lb 15.9 oz (94.8 kg)  07/18/13 208 lb 15.9 oz (94.8 kg)  07/18/13 208 lb 15.9 oz (94.8 kg)  07/03/13 192 lb 3.9 oz (87.2 kg)  05/22/13 194 lb (87.998 kg)  03/03/13 194 lb 3.6 oz (88.1 kg)  02/20/13 202 lb (91.627 kg)  02/20/13 202 lb (91.627 kg)    Usual  Body Weight: 197 lb  % Usual Body Weight: 91%  BMI:  Body mass index is 32.86 kg/(m^2).  Estimated Nutritional Needs: Kcal: 1700-1900 Protein: 80-90 gm Fluid: 1.7-1.9 L  Skin: Intact  Diet Order: Carb Control  EDUCATION NEEDS: -No education needs identified at this time   Intake/Output Summary (Last 24 hours) at 10/01/13 1306 Last data filed at 10/01/13 0354  Gross per 24 hour  Intake    240 ml  Output   1375 ml  Net  -1135 ml    Labs:   Recent Labs Lab 09/30/13 1130  NA 137  K 4.0  CL 103  CO2 18*  BUN 48*  CREATININE 2.17*  CALCIUM 9.5  GLUCOSE 92    CBG (last 3)   Recent Labs  09/30/13 1550 09/30/13 2055 10/01/13 0742  GLUCAP 87 115* 104*    Scheduled Meds: . aspirin  324 mg Oral Once  . aspirin EC  81 mg Oral Daily  . atorvastatin  40 mg Oral QHS  . benazepril  40 mg Oral Daily  . doxepin  10 mg Oral QHS  . enoxaparin (LOVENOX) injection  40 mg Subcutaneous Q24H  . furosemide  40 mg Oral BID  . insulin aspart  0-15 Units Subcutaneous TID WC  . insulin aspart  0-5 Units Subcutaneous QHS  . ketorolac  1 drop Right Eye TID  . levETIRAcetam  500 mg Oral BID  .  metoprolol tartrate  25 mg Oral q morning - 10a  . omega-3 acid ethyl esters  1 g Oral TID  . oxybutynin  10 mg Oral QHS  . pantoprazole  40 mg Oral Daily  . potassium chloride  10 mEq Oral Daily  . sodium chloride  10-40 mL Intracatheter Q12H    Continuous Infusions:   Past Medical History  Diagnosis Date  . Hypertension   . CAD (coronary artery disease)     multivessel, CABG 6/10.  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  Myoview (11/20/11) showed no ischemia & EF 62%  . Hyperlipidemia   . Deep vein thrombosis, upper right extremity     post-PPM  . Obesity   . Dehiscence of closure of sternum or sternotomy 07/2009    sternal infection; required flap closure  . COPD (chronic obstructive pulmonary disease)     severe lung disease by PFTs 6/12  . Seizure disorder   . AV block 1993    s/p  dual-chamber PPM, 1993, Medtronic Kappa; generator change-2003   . PUD (peptic ulcer disease)   . Pacemaker   . Asthma   . Orthopnea   . Sleep apnea     denies CPAP use on 09/30/2013  . Diabetes mellitus, type II   . H/O hiatal hernia   . Gastroesophageal reflux disease   . Migraines     "years ago" (09/30/2013)  . Seizures     "long time ago; don't remember what they were related to" (09/30/2013)  . DDD (degenerative disc disease)   . Arthritis     "legs and back" (09/30/2013)  . Chronic lower back pain   . Urge incontinence of urine     Past Surgical History  Procedure Laterality Date  . Pacemaker placement  1993    Status post DDD pacemaker implantation in 1993, with Medtronic Kappa generator change in 2003 for  second-degree AV block, most recent gen change by Fawn Kirk 04/14/11  . Total hip arthroplasty Right   . Replacement total knee Bilateral   . Thyroidectomy    . Wrist surgery Left     "tumor taken off" (09/30/2013)  . Coronary artery bypass graft  05/1999    - LIMA to LAD, SVG to OM, SVG to PDA  . Reconstructive repair sternal      for infection s/p CABG 8/10  . Back surgery    . Esophagogastroduodenoscopy      with [facility] dilation,  biopsy, and disruption Schatzki's ring  . Colonoscopy  04/27/2005    hemorrhoids, diverticula  . Esophagogastroduodenoscopy  09/10/2012    RMR: Noncritical Schatzki's ring. Diffuse gastric erosions and an  area of partially healing ulceration-status post biopsy/ Small hiatal hernia. Bx reactive gastropathy.  . Abdominal hysterectomy    . Colonoscopy with esophagogastroduodenoscopy (egd) N/A 02/20/2013    RMR: pancolonic diverticulosis and internal hemorrhoids. EGD with small hiatal hernia, healed gastric ulcer  . Portacath placement Left 07/17/2013    Procedure: INSERTION PORT-A-CATH-left subclavian;  Surgeon: Fabio Bering, MD;  Location: [facility];  Service: General;  Laterality: Left;  . Appendectomy    . Cholecystectomy    . Insert /  replace / remove pacemaker  1993  . Lumbar disc surgery    . Posterior lumbar fusion    . Tubal ligation    . Cesarean section  ~1980    Maureen Chatters, RD, LDN Pager #: [phone] After-Hours Pager #: [phone]

## 2013-10-01 NOTE — Progress Notes (Signed)
Lexiscan Cardiolite performed. 

## 2013-10-01 NOTE — Progress Notes (Signed)
Went to see pt to discuss D/C plans.   Pt c/o right-chest pain, same as admission pain. She had pain earlier, improved w/ GI cocktail but returned in a few minutes. She has this pain at home. She takes Tylenol (no help). It comes on without warning or cause and leaves at some point.   She appears uncomfortable (possibly a little anxious). Her right chest wall is tender to palpation. The pain is worse when she moves or takes a deep breath.  Discussed pain control with patient. She remembers meds being stopped in August because they made her crazy and does not want that to happen again. Reluctant to use NSAIDS with renal insufficiency.  Will try Tramadol PRN and Voltaren cream. Hold off on d/c for now, possible d/c in am if symptoms better controlled.   D/C summary is under incomplete notes. Hold Lasix, recheck BMET in am.

## 2013-10-01 NOTE — Progress Notes (Signed)
ELECTROPHYSIOLOGY ROUNDING NOTE    Patient Name: [name] Date of Encounter: 10/01/2013    SUBJECTIVE:Patient admitted yesterday for chest pain.  Some recurrent pain last night but relieved without intervention.  Cardiac enzymes negative.  Plan for myoview today.   TELEMETRY: Reviewed telemetry pt in atrial pacing with intrinsic ventricular conduction Filed Vitals:   09/30/13 1330 09/30/13 1403 09/30/13 2027 10/01/13 0432  BP: 122/59 152/73  102/53  Pulse: 60 64 64 52  Temp:  98.1 F (36.7 C) 98.1 F (36.7 C) 98.4 F (36.9 C)  TempSrc:  Oral Oral Oral  Resp: 15 16 18 18   Height:  5\' 2"  (1.575 m)    Weight:  179 lb 11.2 oz (81.511 kg)    SpO2: 98% 94% 98% 100%    Intake/Output Summary (Last 24 hours) at 10/01/13 0649 Last data filed at 10/01/13 0354  Gross per 24 hour  Intake    240 ml  Output   1375 ml  Net  -1135 ml    CURRENT MEDICATIONS: . aspirin  324 mg Oral Once  . aspirin EC  81 mg Oral Daily  . atorvastatin  40 mg Oral QHS  . benazepril  40 mg Oral Daily  . doxepin  10 mg Oral QHS  . enoxaparin (LOVENOX) injection  40 mg Subcutaneous Q24H  . furosemide  40 mg Oral BID  . insulin aspart  0-15 Units Subcutaneous TID WC  . insulin aspart  0-5 Units Subcutaneous QHS  . ketorolac  1 drop Right Eye TID  . levETIRAcetam  500 mg Oral BID  . metoprolol tartrate  25 mg Oral q morning - 10a  . omega-3 acid ethyl esters  1 g Oral TID  . oxybutynin  10 mg Oral QHS  . pantoprazole  40 mg Oral Daily  . potassium chloride  10 mEq Oral Daily  . sodium chloride  10-40 mL Intracatheter Q12H    LABS: Basic Metabolic Panel:  Recent Labs  16/10/96 1130  NA 137  K 4.0  CL 103  CO2 18*  GLUCOSE 92  BUN 48*  CREATININE 2.17*  CALCIUM 9.5  CBC:  Recent Labs  09/30/13 1130  WBC 8.5  HGB 9.9*  HCT 30.3*  MCV 87.3  PLT 195   Cardiac Enzymes:  Recent Labs  09/30/13 1130 09/30/13 1800 10/01/13  TROPONINI <0.30 <0.30 <0.30  Hemoglobin  A1C:  Recent Labs  09/30/13 1459  HGBA1C 6.2*    Radiology/Studies:  Dg Chest Portable 1 View 09/30/2013   CLINICAL DATA:  Chest pain  EXAM: PORTABLE CHEST - 1 VIEW  COMPARISON:  07/22/2013  FINDINGS: Cardiac shadow remains enlarged. Postsurgical changes are seen. A left-sided chest wall port is again noted. A pacing device is again seen. The lungs are clear bilaterally.  IMPRESSION: No acute abnormality noted.   Electronically Signed   By: Alcide Clever M.D.   On: 09/30/2013 12:00     PHYSICAL EXAM Well appearing obese 72 yo woman, NAD HEENT: Unremarkable Neck:  No JVD, no thyromegally Back:  No CVA tenderness Lungs:  Clear with no wheezes HEART:  Regular rate rhythm, no murmurs, no rubs, no clicks Abd:  Flat, positive bowel sounds, no organomegally, no rebound, no guarding Ext:  2 plus pulses, no edema, no cyanosis, no clubbing Skin:  No rashes no nodules Neuro:  CN II through XII intact, motor grossly intact  Tele - atrial pacing  Active Problems:   Precordial pain  Will plan Lexiscan myoview  today (patient with trouble ambulating and has a PPM). If low risk, ok for discharge home. If high risk, heart cath.  Leonia Reeves.D.

## 2013-10-01 NOTE — Progress Notes (Signed)
Chaplain responded to inpatient spiritual care consult. Pt was sitting on the edge of her bed and eating lunch when chaplain arrived. Pt was very receptive to visit. 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 Pt is "dedicated to being ready for Christ to come back" through "living each day following God's word and way." Pt is "worried" about chest pains. Pt also grieving daughter's death 4 months ago, and has 1 daughter who is fighting cancer. A third daughter is also experiencing health problems. Pt's husband is at home and disabled.   Pt requested prayer for her family.   Chaplain provided empathic listening, presence, emotional/grief/spiritual support, and prayer. Pt said "thank you so much" for chaplain support.  Maurene Capes, [place] [phone]

## 2013-10-02 ENCOUNTER — Telehealth: Payer: Self-pay

## 2013-10-02 ENCOUNTER — Encounter: Payer: PRIVATE HEALTH INSURANCE | Admitting: Internal Medicine

## 2013-10-02 DIAGNOSIS — I10 Essential (primary) hypertension: Secondary | ICD-10-CM

## 2013-10-02 DIAGNOSIS — Z950 Presence of cardiac pacemaker: Secondary | ICD-10-CM

## 2013-10-02 DIAGNOSIS — E43 Unspecified severe protein-calorie malnutrition: Secondary | ICD-10-CM | POA: Insufficient documentation

## 2013-10-02 LAB — BASIC METABOLIC PANEL
BUN: 41 mg/dL — ABNORMAL HIGH (ref 6–23)
CO2: 23 mEq/L (ref 19–32)
Chloride: 105 mEq/L (ref 96–112)
GFR calc Af Amer: 36 mL/min — ABNORMAL LOW (ref >90)
Glucose, Bld: 101 mg/dL — ABNORMAL HIGH (ref 70–99)
Potassium: 3.8 mEq/L (ref 3.5–5.1)
Sodium: 138 mEq/L (ref 135–145)

## 2013-10-02 LAB — GLUCOSE, CAPILLARY: Glucose-Capillary: 99 mg/dL (ref 70–99)

## 2013-10-02 MED ORDER — HEPARIN SOD (PORK) LOCK FLUSH 100 UNIT/ML IV SOLN: 500.0000 [IU] | INTRAVENOUS | Status: DC | PRN

## 2013-10-02 MED ORDER — FUROSEMIDE 40 MG PO TABS: 40.0000 mg | ORAL_TABLET | Freq: Every day | ORAL | Status: DC

## 2013-10-02 NOTE — Discharge Summary (Signed)
CARDIOLOGY DISCHARGE SUMMARY    Patient ID: [name],  MRN: [id number], DOB/AGE: June 15, 1941 72 y.o.  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 No fixed defect. Stress images demonstrate no areas of reversibility to suggest inducible ischemia. Evaluation wall motion demonstrates normal left ventricular wall motion and thickening. Ejection fraction is estimated at 65%. End diastolic volume of 57 cc. End systolic volume of 20 cc. IMPRESSION: 1. No fixed or reversible defects to suggest inducible ischemia. 2. Normal left ventricular wall motion and thickening. 3. Normal ejection fraction.  History and Hospital Course:  [name] is a 72 y.o. female with a history of CAD.  She went to her MDs office for back pain but was also complaining of chest pain. She was transferred to the emergency room and admitted for further evaluation and treatment.   Her cardiac enzymes were negative for MI. She was assessed for volume overload but her volume status was at baseline. Her blood sugars were controlled with sliding scale insulin. A hemoglobin A1c was only minimally elevated at 6.2.   She was anemic but her hemoglobin and hematocrit were stable when compared to previous values in improved since discharge in August. This will be followed as an outpatient.   She was continued on her home hypertensive medications and her blood pressure was stable. However, her BUN/Cr  were higher than recent values, so her Lasix was decreased 40 mg BID to 40 mg daily. This will be followed as an outpatient.   On 10/01/2013, she had a LexiScan nuclear stress test since her cardiac enzymes are within normal limits. The full results are as outlined above. It is a low risk study. She will be continued on her home dose of potassium for now. She will be encouraged to follow up with Dr. Juanetta Gosling or Nephrology.   On 10/02/2013, she was seen by Dr. Johney Frame. Her chest pain had resolved. He reviewed all available data and felt that no further inpatient workup was indicated. She is considered stable for discharge home today to follow up as an outpatient.  Discharge Vitals: Blood pressure 116/73, pulse 62, temperature 98.3 F (36.8 C), temperature source Oral, resp. rate 18, height 5\' 2"  (1.575 m), weight 179 lb 11.2 oz (81.511 kg), SpO2 96.00%.   Labs: Lab Results  Component Value Date   WBC 8.5 09/30/2013   HGB 9.9* 09/30/2013   HCT 30.3* 09/30/2013   MCV 87.3 09/30/2013   PLT 195 09/30/2013     Recent Labs Lab 10/02/13 0535  NA 138  K 3.8  CL 105  CO2 23  BUN 41*  CREATININE 1.57*  CALCIUM 9.6  GLUCOSE 101*   Lab Results  Component Value Date   CKTOTAL 42 03/24/2012   CKMB 1.5 03/24/2012   TROPONINI <0.30 10/01/2013    Disposition:  The patient is being discharged in stable condition.  Follow-up:     Follow-up Information   Follow up with HAWKINS,EDWARD L, MD. Schedule an appointment as soon as possible for a visit in 1 week. (  For hospital follow-up)    Specialty:  Pulmonary Disease   Contact information:   [address] [place] [address] [phone]       Follow up with Lewayne Bunting, MD On 10/10/2013. (At 11:45 AM)    Specialty:  Cardiology   Contact information:   [address] [place] [address] [phone]      Discharge Medications:    Medication List         aspirin EC 81 MG tablet  Take 81 mg by  mouth daily.     atorvastatin 40 MG tablet  Commonly known as:  LIPITOR  Take 40 mg by mouth at bedtime.     benazepril 40 MG tablet  Commonly known as:  LOTENSIN  Take 1 tablet (40 mg total) by mouth daily.     doxepin 10 MG capsule  Commonly known as:  SINEQUAN  Take 1 capsule (10 mg total) by mouth at bedtime.     Fish Oil 1000 MG Caps  Take 1 capsule by mouth 3 (three) times daily.     furosemide 40 MG tablet  Commonly known as:  LASIX  Take 1 tablet (40 mg total) by mouth daily.     ketorolac 0.5 % ophthalmic solution  Commonly known as:  ACULAR  Place 1 drop into the right eye 3 (three) times daily.     levETIRAcetam 500 MG tablet  Commonly known as:  KEPPRA  Take 1 tablet (500 mg total) by mouth 2 (two) times daily.     metFORMIN 500 MG tablet  Commonly known as:  GLUCOPHAGE  Take 500 mg by mouth 2 (two) times daily with a meal.     metoprolol tartrate 25 MG tablet  Commonly known as:  LOPRESSOR  Take 25 mg by mouth every morning.     nitroGLYCERIN 0.4 MG SL tablet  Commonly known as:  NITROSTAT  Place 0.4 mg under the tongue every 5 (five) minutes as needed for chest pain.     oxybutynin 10 MG 24 hr tablet  Commonly known as:  DITROPAN-XL  Take 10 mg by mouth at bedtime.     pantoprazole 40 MG tablet  Commonly known as:  PROTONIX  Take 40 mg by mouth daily.     potassium chloride 10 MEQ tablet  Commonly known as:  K-DUR,KLOR-CON  Take 10 mEq by mouth daily.     senna-docusate 8.6-50 MG per tablet  Commonly known as:  Senokot-S  Take 1 tablet by mouth daily as needed for constipation.       Duration of Discharge Encounter: Greater than 30 minutes including physician time.  Limmie Patricia, PA-C 10/02/2013, 8:03 AM   Hillis Range MD

## 2013-10-02 NOTE — Progress Notes (Signed)
Pt provided with dc instructions and edcuation. No questions at this time. Herat monitor cleaned and returned to front. Ilona Sorrel, RN

## 2013-10-02 NOTE — Telephone Encounter (Signed)
Patient called regarding appointment with Dr. Ladona Ridgel scheduled 11.06.2014.  Due to transportation issues patient can't come on the scheduled date or the next time Dr. Ladona Ridgel is in [place].  Transportation will take her to [facility] to see Dr. Ladona Ridgel but they need a letter sent stating Dr. Ladona Ridgel is the only physician that can provide services and it is medically necessary for the patient to be seen.  Another alternative is if Dr. Ladona Ridgel will allow [place] to overbook his schedule for this patient to be seen in [place].  Please advise.  Letter can be faxed Attn:  [place] to [phone].

## 2013-10-02 NOTE — Telephone Encounter (Signed)
Dr Ladona Ridgel will allow to [facility]

## 2013-10-02 NOTE — Progress Notes (Signed)
Patient: [name] Date of Encounter: 10/02/2013, 7:02 AM Admit date: 09/30/2013     Subjective  [name] reports she is feeling better this AM. Her CP has resolved without recurrence. She denies SOB. She is eager to go home.   Objective  Physical Exam: Vitals: BP 116/73  Pulse 62  Temp(Src) 98.3 F (36.8 C) (Oral)  Resp 18  Ht 5\' 2"  (1.575 m)  Wt 179 lb 11.2 oz (81.511 kg)  BMI 32.86 kg/m2  SpO2 96% General: Well developed, well appearing 72 year old female in no acute distress. Neck: Supple. JVD not elevated. Lungs: Clear bilaterally to auscultation without wheezes, rales, or rhonchi. Breathing is unlabored. Heart: Regular S1 S2 without murmur, rub or gallop.  Abdomen: Soft, non-distended. Extremities: No clubbing or cyanosis. No edema.  Distal pedal pulses are 2+ and equal bilaterally. Neuro: Alert and oriented X 3. Moves all extremities spontaneously. No focal deficits.  Intake/Output:  Intake/Output Summary (Last 24 hours) at 10/02/13 4098 Last data filed at 10/02/13 0438  Gross per 24 hour  Intake      0 ml  Output    800 ml  Net   -800 ml    Inpatient Medications:  . aspirin  324 mg Oral Once  . aspirin EC  81 mg Oral Daily  . atorvastatin  40 mg Oral QHS  . benazepril  40 mg Oral Daily  . diclofenac sodium  2 g Topical QID  . doxepin  10 mg Oral QHS  . enoxaparin (LOVENOX) injection  40 mg Subcutaneous Q24H  . feeding supplement (GLUCERNA SHAKE)  237 mL Oral BID BM  . insulin aspart  0-15 Units Subcutaneous TID WC  . insulin aspart  0-5 Units Subcutaneous QHS  . ketorolac  1 drop Right Eye TID  . levETIRAcetam  500 mg Oral BID  . metoprolol tartrate  25 mg Oral q morning - 10a  . omega-3 acid ethyl esters  1 g Oral TID  . oxybutynin  10 mg Oral QHS  . pantoprazole  40 mg Oral Daily  . potassium chloride  10 mEq Oral Daily  . sodium chloride  10-40 mL Intracatheter Q12H    Labs:  Recent Labs  09/30/13 1130 10/02/13 0535  NA 137 138  K  4.0 3.8  CL 103 105  CO2 18* 23  GLUCOSE 92 101*  BUN 48* 41*  CREATININE 2.17* 1.57*  CALCIUM 9.5 9.6    Recent Labs  09/30/13 1130  WBC 8.5  HGB 9.9*  HCT 30.3*  MCV 87.3  PLT 195    Recent Labs  09/30/13 1130 09/30/13 1800 10/01/13  TROPONINI <0.30 <0.30 <0.30    Recent Labs  09/30/13 1459  HGBA1C 6.2*    Radiology/Studies: Nm Myocar Multi W/spect W/wall Motion / Ef 10/01/2013    FINDINGS: Rest images demonstrate normal left ventricular cavity size. No fixed defect.  Stress images demonstrate no areas of reversibility to suggest inducible ischemia.  Evaluation wall motion demonstrates normal left ventricular wall motion and thickening. Ejection fraction is estimated at 65%. End diastolic volume of 57 cc. End systolic volume of 20 cc.   IMPRESSION: 1.  No fixed or reversible defects to suggest inducible ischemia. 2. Normal left ventricular wall motion and thickening. 3. Normal ejection fraction.    Electronically Signed   By: Jeronimo Greaves M.D.   On: 10/01/2013 16:44   Dg Chest Portable 1 View 09/30/2013      IMPRESSION: No acute abnormality  noted.    Electronically Signed   By: Alcide Clever M.D.   On: 09/30/2013 12:00   Telemetry: A paced V sensed; no arrhythmias   Assessment and Plan  1. Chest pain - resolved - Myoview stress test negative for ischemia this admission 2. CAD s/p CABG 2010 3. Preserved LV function 4. Bradycardia s/p PPM implant 5. HTN 6. COPD 7. Renal insufficiency - serum Cr improved this AM - baseline appears to be 1.3 since Feb 2014 - continue Lasix daily (decreased from BID dosing) 8. Seizure disorder 9. Anemia - chronic - followed by PCP  Signed, EDMISTEN, BROOKE PA-C  I have seen, examined the patient, and reviewed the above assessment and plan.  Changes to above are made where necessary.  Chest pain is resolved.  She wants to go home. Will DC to home today.  Will follow-up with Dr Ladona Ridgel in 2 weeks.  Will also establish with  Dr Wyline Mood for general cardiology.  Co Sign: Hillis Range, MD 10/02/2013 8:26 AM

## 2013-10-10 ENCOUNTER — Encounter: Payer: PRIVATE HEALTH INSURANCE | Admitting: Internal Medicine

## 2013-10-11 ENCOUNTER — Telehealth: Payer: Self-pay | Admitting: Cardiology

## 2013-10-11 MED ORDER — NITROGLYCERIN 0.4 MG SL SUBL: 0.4000 mg | SUBLINGUAL_TABLET | SUBLINGUAL | Status: DC | PRN

## 2013-10-11 NOTE — Telephone Encounter (Signed)
NEEDS REFILL ON NTG SENT TO [facility] IN [place] / [facility]

## 2013-10-14 ENCOUNTER — Encounter: Payer: PRIVATE HEALTH INSURANCE | Admitting: Cardiology

## 2013-10-16 ENCOUNTER — Encounter: Payer: PRIVATE HEALTH INSURANCE | Admitting: Cardiology

## 2013-10-30 ENCOUNTER — Encounter: Payer: Self-pay | Admitting: Internal Medicine

## 2013-10-30 ENCOUNTER — Ambulatory Visit (INDEPENDENT_AMBULATORY_CARE_PROVIDER_SITE_OTHER): Payer: PRIVATE HEALTH INSURANCE | Admitting: Internal Medicine

## 2013-10-30 VITALS — BP 102/62 | HR 68 | Ht 62.0 in | Wt 184.0 lb

## 2013-10-30 DIAGNOSIS — Z01810 Encounter for preprocedural cardiovascular examination: Secondary | ICD-10-CM | POA: Insufficient documentation

## 2013-10-30 DIAGNOSIS — I4430 Unspecified atrioventricular block: Secondary | ICD-10-CM

## 2013-10-30 DIAGNOSIS — Z950 Presence of cardiac pacemaker: Secondary | ICD-10-CM

## 2013-10-30 DIAGNOSIS — I5032 Chronic diastolic (congestive) heart failure: Secondary | ICD-10-CM

## 2013-10-30 LAB — MDC_IDC_ENUM_SESS_TYPE_INCLINIC
Battery Impedance: 137 Ohm
Brady Statistic AP VP Percent: 2 %
Brady Statistic AP VS Percent: 68 %
Brady Statistic AS VP Percent: 0 %
Brady Statistic AS VS Percent: 30 %
Date Time Interrogation Session: 20141126104745
Lead Channel Impedance Value: 589 Ohm
Lead Channel Impedance Value: 625 Ohm
Lead Channel Pacing Threshold Amplitude: 1 V
Lead Channel Pacing Threshold Pulse Width: 0.4 ms
Lead Channel Sensing Intrinsic Amplitude: 15.67 mV
Lead Channel Sensing Intrinsic Amplitude: 4 mV

## 2013-10-30 NOTE — Patient Instructions (Addendum)
Your physician recommends that you schedule a follow-up appointment in: 1 year with Dr Taylor and 6 months with Paula You will receive a reminder letter two months in advance reminding you to call and schedule your appointment. If you don't receive this letter, please contact our office.    

## 2013-10-30 NOTE — Progress Notes (Signed)
HPI [name] returns today for pacemaker followup. She is a 72 year old woman with chronic diastolic heart failure, coronary artery disease, status post bypass surgery, COPD on nighttime home oxygen, symptomatic bradycardia, status post permanent pacemaker insertion. She also has obesity, and chronic arthritis. Her main complaint today is hip pain. She also has a history of chest discomfort. She denies syncope or near-syncope. No chest pain. She was recently hospitalized with chest discomfort and had a negative stress test with no ischemia, normal perfusion, and normal left ventricular systolic function. She is pending hip replacement surgery. She today seeking surgical clearance. Allergies  Allergen Reactions  . Codeine     "good" headache  . Other     Leafy raw vegetables and seeds  . Oxycodone Hcl     "good" headache; also OxyContin  . Reglan [Metoclopramide]     CONFUSION     Current Outpatient Prescriptions  Medication Sig Dispense Refill  . aspirin EC 81 MG tablet Take 81 mg by mouth daily.      Marland Kitchen atorvastatin (LIPITOR) 40 MG tablet Take 40 mg by mouth at bedtime.       . benazepril (LOTENSIN) 40 MG tablet Take 1 tablet (40 mg total) by mouth daily.  30 tablet  12  . doxepin (SINEQUAN) 10 MG capsule Take 1 capsule (10 mg total) by mouth at bedtime.  30 capsule  12  . furosemide (LASIX) 40 MG tablet Take 1 tablet (40 mg total) by mouth daily.  30 tablet  4  . ketorolac (ACULAR) 0.5 % ophthalmic solution Place 1 drop into the right eye 3 (three) times daily.  5 mL  0  . levETIRAcetam (KEPPRA) 500 MG tablet Take 1 tablet (500 mg total) by mouth 2 (two) times daily.  60 tablet  12  . metFORMIN (GLUCOPHAGE) 500 MG tablet Take 500 mg by mouth 2 (two) times daily with a meal.       . metoprolol tartrate (LOPRESSOR) 25 MG tablet Take 25 mg by mouth every morning.       . nitroGLYCERIN (NITROSTAT) 0.4 MG SL tablet Place 1 tablet (0.4 mg total) under the tongue every 5 (five) minutes as  needed for chest pain.  25 tablet  4  . Omega-3 Fatty Acids (FISH OIL) 1000 MG CAPS Take 1 capsule by mouth 3 (three) times daily.      Marland Kitchen oxybutynin (DITROPAN-XL) 10 MG 24 hr tablet Take 10 mg by mouth at bedtime.      . pantoprazole (PROTONIX) 40 MG tablet Take 40 mg by mouth daily.       . potassium chloride (K-DUR,KLOR-CON) 10 MEQ tablet Take 10 mEq by mouth daily.      Marland Kitchen senna-docusate (SENOKOT-S) 8.6-50 MG per tablet Take 1 tablet by mouth daily as needed for constipation.        No current facility-administered medications for this visit.     Past Medical History  Diagnosis Date  . Hypertension   . CAD (coronary artery disease)     multivessel, CABG 6/10.  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  Myoview (11/20/11) showed no ischemia & EF 62%  . Hyperlipidemia   . Deep vein thrombosis, upper right extremity     post-PPM  . Obesity   . Dehiscence of closure of sternum or sternotomy 07/2009    sternal infection; required flap closure  . COPD (chronic obstructive pulmonary disease)     severe lung disease by PFTs 6/12  . Seizure disorder   . AV block  1993    s/p dual-chamber PPM, 1993, Medtronic Kappa; generator change-2003   . PUD (peptic ulcer disease)   . Pacemaker   . Asthma   . Orthopnea   . Sleep apnea     denies CPAP use on 09/30/2013  . Diabetes mellitus, type II   . H/O hiatal hernia   . Gastroesophageal reflux disease   . Migraines     "years ago" (09/30/2013)  . Seizures     "long time ago; don't remember what they were related to" (09/30/2013)  . DDD (degenerative disc disease)   . Arthritis     "legs and back" (09/30/2013)  . Chronic lower back pain   . Urge incontinence of urine     ROS:   All systems reviewed and negative except as noted in the HPI.   Past Surgical History  Procedure Laterality Date  . Pacemaker placement  1993    Status post DDD pacemaker implantation in 1993, with Medtronic Kappa generator change in 2003 for  second-degree AV block, most recent gen  change by Fawn Kirk 04/14/11  . Total hip arthroplasty Right   . Replacement total knee Bilateral   . Thyroidectomy    . Wrist surgery Left     "tumor taken off" (09/30/2013)  . Coronary artery bypass graft  05/1999    - LIMA to LAD, SVG to OM, SVG to PDA  . Reconstructive repair sternal      for infection s/p CABG 8/10  . Back surgery    . Esophagogastroduodenoscopy      with [facility] dilation,  biopsy, and disruption Schatzki's ring  . Colonoscopy  04/27/2005    hemorrhoids, diverticula  . Esophagogastroduodenoscopy  09/10/2012    RMR: Noncritical Schatzki's ring. Diffuse gastric erosions and an  area of partially healing ulceration-status post biopsy/ Small hiatal hernia. Bx reactive gastropathy.  . Abdominal hysterectomy    . Colonoscopy with esophagogastroduodenoscopy (egd) N/A 02/20/2013    RMR: pancolonic diverticulosis and internal hemorrhoids. EGD with small hiatal hernia, healed gastric ulcer  . Portacath placement Left 07/17/2013    Procedure: INSERTION PORT-A-CATH-left subclavian;  Surgeon: Fabio Bering, MD;  Location: [facility];  Service: General;  Laterality: Left;  . Appendectomy    . Cholecystectomy    . Insert / replace / remove pacemaker  1993  . Lumbar disc surgery    . Posterior lumbar fusion    . Tubal ligation    . Cesarean section  ~1980     Family History  Problem Relation Age of Onset  . Diabetes    . Heart disease Mother     MI  . Coronary artery disease      Female <55  . Cancer    . Colon cancer Father     age 38     History   Social History  . Marital Status: Married    Spouse Name: N/A    Number of Children: 5  . Years of Education: N/A   Occupational History  .     Social History Main Topics  . Smoking status: Never Smoker   . Smokeless tobacco: Former Neurosurgeon    Types: Snuff     Comment: 09/30/2013 "stopped snuff a long time ago"  . Alcohol Use: No  . Drug Use: No  . Sexual Activity: No   Other Topics Concern  . Not on file   Social  History Narrative  . No narrative on file     BP 102/62  Pulse 68  Ht 5\' 2"  (1.575 m)  Wt 184 lb (83.462 kg)  BMI 33.65 kg/m2  Physical Exam:  Chronically ill appearing 72 year old woman, NAD HEENT: Unremarkable Neck:  7 cm JVD, no thyromegally Back:  Musculoskeletal tenderness is present in the lower back region. Lungs:  Clear except for rales in the bases bilaterally. No wheezes or rhonchi. No increased work of breathing. HEART:  Regular rate rhythm, no murmurs, no rubs, no clicks Abd:  soft, positive bowel sounds, no organomegally, no rebound, no guarding Ext:  2 plus pulses, no edema, no cyanosis, no clubbing Skin:  No rashes no nodules Neuro:  CN II through XII intact, motor grossly intact  DEVICE  Normal device function.  See PaceArt for details.   Assess/Plan:

## 2013-10-30 NOTE — Assessment & Plan Note (Signed)
Her heart failure symptoms are class II. She will maintain a low-sodium diet. She will continue her current medical therapy.

## 2013-10-30 NOTE — Assessment & Plan Note (Signed)
The patient has very severe medically refractory hip pain. She has a surgical indication. Her candidacy for surgery is not great but I think he is an acceptable risk, particularly based on the very poor quality of life she experiences now from chronic and unrelenting pain. If needed, RT will be available to help care for her cardiovascular issues, if any arise postoperatively.

## 2013-10-30 NOTE — Assessment & Plan Note (Signed)
Her Medtronic dual-chamber pacemaker is working normally. We'll plan to recheck in several months. 

## 2013-11-04 ENCOUNTER — Encounter: Payer: Self-pay | Admitting: Internal Medicine

## 2013-11-06 ENCOUNTER — Other Ambulatory Visit: Payer: Self-pay

## 2013-11-06 MED ORDER — PANTOPRAZOLE SODIUM 40 MG PO TBEC: 40.0000 mg | DELAYED_RELEASE_TABLET | Freq: Every day | ORAL | Status: DC

## 2013-11-11 NOTE — Progress Notes (Signed)
Surgery scheduled for 12/03/13.  Preop on 11/19/13 at 1000am.  Need orders in EPIC.  Thank You.

## 2013-11-19 ENCOUNTER — Other Ambulatory Visit (HOSPITAL_COMMUNITY): Payer: PRIVATE HEALTH INSURANCE

## 2013-11-20 ENCOUNTER — Encounter (HOSPITAL_COMMUNITY): Payer: Self-pay | Admitting: Pharmacy Technician

## 2013-11-21 ENCOUNTER — Encounter (HOSPITAL_COMMUNITY): Payer: Self-pay

## 2013-11-21 ENCOUNTER — Encounter (INDEPENDENT_AMBULATORY_CARE_PROVIDER_SITE_OTHER): Payer: Self-pay

## 2013-11-21 ENCOUNTER — Encounter (HOSPITAL_COMMUNITY)
Admission: RE | Admit: 2013-11-21 | Discharge: 2013-11-21 | Disposition: A | Discharge status: Home / Self Care | Payer: PRIVATE HEALTH INSURANCE | Source: Ambulatory Visit | Attending: Orthopedic Surgery | Admitting: Orthopedic Surgery

## 2013-11-21 DIAGNOSIS — Z01812 Encounter for preprocedural laboratory examination: Secondary | ICD-10-CM | POA: Insufficient documentation

## 2013-11-21 HISTORY — DX: Anemia, unspecified: D64.9

## 2013-11-21 HISTORY — DX: Cerebral infarction, unspecified: I63.9

## 2013-11-21 HISTORY — DX: Other visual disturbances: H53.8

## 2013-11-21 HISTORY — DX: Angina pectoris, unspecified: I20.9

## 2013-11-21 HISTORY — DX: Personal history of urinary calculi: Z87.442

## 2013-11-21 LAB — BASIC METABOLIC PANEL
CO2: 21 mEq/L (ref 19–32)
Chloride: 104 mEq/L (ref 96–112)
Glucose, Bld: 91 mg/dL (ref 70–99)
Potassium: 3.9 mEq/L (ref 3.5–5.1)
Sodium: 138 mEq/L (ref 135–145)

## 2013-11-21 LAB — URINALYSIS, ROUTINE W REFLEX MICROSCOPIC
Bilirubin Urine: NEGATIVE
Glucose, UA: NEGATIVE mg/dL
Hgb urine dipstick: NEGATIVE
Leukocytes, UA: NEGATIVE
Protein, ur: NEGATIVE mg/dL
Specific Gravity, Urine: 1.018 (ref 1.005–1.030)
pH: 5.5 (ref 5.0–8.0)

## 2013-11-21 LAB — CBC
HCT: 31.7 % — ABNORMAL LOW (ref 36.0–46.0)
MCHC: 32.8 g/dL (ref 30.0–36.0)
MCV: 85.9 fL (ref 78.0–100.0)
RBC: 3.69 MIL/uL — ABNORMAL LOW (ref 3.87–5.11)
WBC: 7.5 10*3/uL (ref 4.0–10.5)

## 2013-11-21 LAB — APTT: aPTT: 36 seconds (ref 24–37)

## 2013-11-21 NOTE — Progress Notes (Addendum)
Chest x-ray 07/22/13 on EPIC, EKG 10/03/13 on EPIC, Pacemaker orders on chart, surgery clearance note 10/30/13 Dr. Ladona Ridgel on chart, surgery clearance note 11/25/13 Dr. Juanetta Gosling on chart, LOV note 11/25/13 Dr. Juanetta Gosling on chart

## 2013-11-21 NOTE — Patient Instructions (Addendum)
20 Khristie P Canoy  11/21/2013   Your procedure is scheduled on: 12/03/13  Report to [facility] at 10:30 AM.  Call this number if you have problems the morning of surgery [phone]   Remember:   Do not eat food After Midnight, clear liquids from midnight until 0730 am on 12/03/13 then nothing.      Take these medicines the morning of surgery with A SIP OF WATER: acular eye drops, keppra, metoprolol, protonix    Do not wear jewelry, make-up or nail polish.  Do not wear lotions, powders, or perfumes. You may wear deodorant.  Do not shave 48 hours prior to surgery. Men may shave face and neck.  Do not bring valuables to the hospital.  Contacts, dentures or bridgework may not be worn into surgery.  Leave suitcase in the car. After surgery it may be brought to your room.  For patients admitted to the hospital, checkout time is 11:00 AM the day of discharge.    Please read over the following fact sheets that you were given: MRSA Information, incentive spirometry fact sheet, blood fact sheet, clear liquids fact sheet Birdie Sons, RN  pre op nurse call if needed [phone]    FAILURE TO FOLLOW THESE INSTRUCTIONS MAY RESULT IN CANCELLATION OF YOUR SURGERY   Patient Signature: ___________________________________________

## 2013-12-01 NOTE — H&P (Signed)
TOTAL HIP ADMISSION H&P  Patient is admitted for left total hip arthroplasty, anterior approach.  Subjective:  Chief Complaint:    Left hip OA / pain  HPI: [name], 72 y.o. female, has a history of pain and functional disability in the left hip(s) due to arthritis and patient has failed non-surgical conservative treatments for greater than 12 weeks to include NSAID's and/or analgesics, corticosteriod injections, use of assistive devices and activity modification.  Onset of symptoms was gradual starting years ago with gradually worsening course since that time.The patient noted no past surgery on the left hip(s).  Patient currently rates pain in the left hip at 10 out of 10 with activity. Patient has night pain, worsening of pain with activity and weight bearing, trendelenberg gait, pain that interfers with activities of daily living and pain with passive range of motion. Patient has evidence of periarticular osteophytes and joint space narrowing by imaging studies. This condition presents safety issues increasing the risk of falls. There is no current active infection.   Risks, benefits and expectations were discussed with the patient.  Risks including but not limited to the risk of anesthesia, blood clots, nerve damage, blood vessel damage, failure of the prosthesis, infection and up to and including death.  Patient understand the risks, benefits and expectations and wishes to proceed with surgery.   D/C Plans:     Home with HHPT  Post-op Meds:     No Rx given  Tranexamic Acid:   Not to be given - previous MI & previous DVT  Decadron:    Not to be given - DM  FYI:    Has a pacemaker  Xarelto / ASA post-op  Norco post-op   Patient Active Problem List   Diagnosis Date Noted  . Preop cardiovascular exam 10/30/2013  . Protein-calorie malnutrition, severe 10/02/2013  . Precordial pain 09/30/2013  . Failure to thrive 07/26/2013  . Anemia 07/13/2013  . Altered mental state 07/13/2013   . NSTEMI (non-ST elevated myocardial infarction) 07/01/2013  . Acute delirium 06/29/2013  . Unspecified constipation 06/29/2013  . Total knee replacement status 05/22/2013  . H/O total hip arthroplasty 05/22/2013  . Spinal stenosis, lumbar region, with neurogenic claudication 05/22/2013  . Osteoarthritis of left hip 05/22/2013  . Noninfectious gastroenteritis and colitis 03/05/2013  . Dehydration 03/05/2013  . Family history of colon cancer 02/15/2013  . PUD (peptic ulcer disease) 02/15/2013  . Diabetes mellitus, type II   . Hypertension   . Arteriosclerotic cardiovascular disease (ASCVD)   . Gastroesophageal reflux disease   . Hyperlipidemia   . Deep vein thrombosis, upper right extremity   . Obesity   . AV block   . COPD (chronic obstructive pulmonary disease)   . Sleep apnea   . Seizure disorder   . Anemia, normocytic normochromic 12/15/2011  . Chronic diastolic heart failure 11/18/2011  . PACEMAKER, PERMANENT 05/13/2009  . 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 GASTRIC ULCER 05/12/2009  . Gastroparesis 05/12/2009  . DIVERTICULAR DISEASE 05/12/2009   Past Medical History  Diagnosis Date  . Hypertension   . CAD (coronary artery disease)     multivessel, CABG 6/10.  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  Myoview (11/20/11) showed no ischemia & EF 62%  . Hyperlipidemia   . Deep vein thrombosis, upper right extremity     post-PPM  . Obesity   . Dehiscence of closure of sternum or sternotomy 07/2009    sternal infection; required flap closure  . COPD (chronic obstructive pulmonary disease)  severe lung disease by PFTs 6/12  . Seizure disorder   . 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 PUD (peptic ulcer disease)   . Pacemaker   . Sleep apnea     denies CPAP use on 09/30/2013  . Diabetes mellitus, type II   . H/O hiatal hernia   . Migraines     "years ago" (09/30/2013)  . DDD (degenerative disc disease)   . Arthritis     "legs and back"  (09/30/2013)  . Chronic lower back pain   . Urge incontinence of urine   . Myocardial infarction     years ago  . Anginal pain     "all the time, but not heart related"  . Asthma     years ago  . Orthopnea     sometimes  . 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 History of kidney stones   . Gastroesophageal reflux disease     not much  . Seizures     "long time ago; don't remember what they were related to" (09/30/2013)none recent  . Anemia   . Blurred vision, bilateral     sometimes double vision    Past Surgical History  Procedure Laterality Date  . Pacemaker placement  1993    Status post DDD pacemaker implantation in 1993, with Medtronic Kappa generator change in 2003 for  second-degree AV block, most recent gen change by Fawn Kirk 04/14/11  . Total hip arthroplasty Right   . Replacement total knee Bilateral   . Thyroidectomy    . Wrist surgery Left     "tumor taken off" (09/30/2013)  . Coronary artery bypass graft  05/1999    - LIMA to LAD, SVG to OM, SVG to PDA  . Reconstructive repair sternal      for infection s/p CABG 8/10  . Esophagogastroduodenoscopy      with [facility] dilation,  biopsy, and disruption Schatzki's ring  . Colonoscopy  04/27/2005    hemorrhoids, diverticula  . Esophagogastroduodenoscopy  09/10/2012    RMR: Noncritical Schatzki's ring. Diffuse gastric erosions and an  area of partially healing ulceration-status post biopsy/ Small hiatal hernia. Bx reactive gastropathy.  . Colonoscopy with esophagogastroduodenoscopy (egd) N/A 02/20/2013    RMR: pancolonic diverticulosis and internal hemorrhoids. EGD with small hiatal hernia, healed gastric ulcer  . Portacath placement Left 07/17/2013    Procedure: INSERTION PORT-A-CATH-left subclavian;  Surgeon: Fabio Bering, MD;  Location: [facility];  Service: General;  Laterality: Left;  . Appendectomy    . Cholecystectomy    . Insert / replace / remove pacemaker  1993  . Lumbar disc surgery    . Posterior lumbar  fusion    . Tubal ligation    . Cesarean section  ~1980  . Abdominal hysterectomy      partial  . Back surgery      x3 total    No prescriptions prior to admission   Allergies  Allergen Reactions  . Codeine     "good" headache  . Other     Leafy raw vegetables and seeds  . Oxycodone Hcl     "good" headache; also OxyContin  . Reglan [Metoclopramide]     CONFUSION    History  Substance Use Topics  . Smoking status: Never Smoker   . Smokeless tobacco: Former Neurosurgeon    Types: Snuff     Comment: 09/30/2013 "stopped snuff a  long time ago"  . Alcohol Use: No    Family History  Problem Relation Age of Onset  . Diabetes    . Heart disease Mother     MI  . Coronary artery disease      Female <55  . Cancer    . Colon cancer Father     age 49     Review of Systems  Constitutional: Negative.   Eyes: Negative.   Respiratory: Positive for shortness of breath (on exertion).   Cardiovascular: Negative.   Gastrointestinal: Positive for heartburn.  Musculoskeletal: Positive for back pain and joint pain.  Skin: Negative.   Neurological: Positive for headaches.  Endo/Heme/Allergies: Negative.     Objective:  Physical Exam  Constitutional: She appears well-developed and well-nourished.  HENT:  Head: Normocephalic and atraumatic.  Mouth/Throat: Oropharynx is clear and moist.  Eyes: Pupils are equal, round, and reactive to light.  Neck: Neck supple. No JVD present. No tracheal deviation present. No thyromegaly present.  Cardiovascular: Normal rate, regular rhythm and intact distal pulses.   Respiratory: Effort normal and breath sounds normal.  GI: Soft. There is no tenderness. There is no guarding.  Musculoskeletal:       Left hip: She exhibits decreased range of motion, decreased strength, tenderness and bony tenderness. She exhibits no swelling, no deformity and no laceration.  Lymphadenopathy:    She has no cervical adenopathy.  Neurological: She is alert.  Skin: Skin is  warm and dry.  Psychiatric: She has a normal mood and affect.     Labs:  Estimated body mass index is 32.86 kg/(m^2) as calculated from the following:   Height as of 09/30/13: 5\' 2"  (1.575 m).   Weight as of 09/30/13: 81.511 kg (179 lb 11.2 oz).   Imaging Review Plain radiographs demonstrate severe degenerative joint disease of the left hip(s). The bone quality appears to be good for age and reported activity level.  Assessment/Plan:  End stage arthritis, left hip(s)  The patient history, physical examination, clinical judgement of the provider and imaging studies are consistent with end stage degenerative joint disease of the left hip(s) and total hip arthroplasty is deemed medically necessary. The treatment options including medical management, injection therapy, arthroscopy and arthroplasty were discussed at length. The risks and benefits of total hip arthroplasty were presented and reviewed. The risks due to aseptic loosening, infection, stiffness, dislocation/subluxation,  thromboembolic complications and other imponderables were discussed.  The patient acknowledged the explanation, agreed to proceed with the plan and consent was signed. Patient is being admitted for inpatient treatment for surgery, pain control, PT, OT, prophylactic antibiotics, VTE prophylaxis, progressive ambulation and ADL's and discharge planning.The patient is planning to be discharged home with home health services.     Anastasio Auerbach Tamecia Mcdougald   PAC  12/01/2013, 7:12 PM

## 2013-12-02 NOTE — Progress Notes (Signed)
Surgery time change from 1335 on 12/03/13 to 1500 on 12/03/13. Pt called and agreed to arrive at short stay at 1200. No questions or concerns.

## 2013-12-03 ENCOUNTER — Encounter (HOSPITAL_COMMUNITY)
Admission: RE | Disposition: A | Discharge status: Home Health | Payer: Self-pay | Source: Ambulatory Visit | Attending: Orthopedic Surgery

## 2013-12-03 ENCOUNTER — Inpatient Hospital Stay (HOSPITAL_COMMUNITY)
Admission: RE | Admit: 2013-12-03 | Discharge: 2013-12-06 | DRG: 470 | Disposition: A | Discharge status: Home Health | Payer: PRIVATE HEALTH INSURANCE | Source: Ambulatory Visit | Attending: Orthopedic Surgery | Admitting: Orthopedic Surgery

## 2013-12-03 ENCOUNTER — Inpatient Hospital Stay (HOSPITAL_COMMUNITY): Payer: PRIVATE HEALTH INSURANCE

## 2013-12-03 ENCOUNTER — Encounter (HOSPITAL_COMMUNITY): Payer: PRIVATE HEALTH INSURANCE | Admitting: Anesthesiology

## 2013-12-03 ENCOUNTER — Encounter (HOSPITAL_COMMUNITY): Payer: Self-pay | Admitting: Anesthesiology

## 2013-12-03 ENCOUNTER — Inpatient Hospital Stay (HOSPITAL_COMMUNITY): Payer: PRIVATE HEALTH INSURANCE | Admitting: Anesthesiology

## 2013-12-03 ENCOUNTER — Other Ambulatory Visit: Payer: Self-pay

## 2013-12-03 DIAGNOSIS — Z8249 Family history of ischemic heart disease and other diseases of the circulatory system: Secondary | ICD-10-CM

## 2013-12-03 DIAGNOSIS — E119 Type 2 diabetes mellitus without complications: Secondary | ICD-10-CM | POA: Diagnosis present

## 2013-12-03 DIAGNOSIS — Z833 Family history of diabetes mellitus: Secondary | ICD-10-CM

## 2013-12-03 DIAGNOSIS — I5032 Chronic diastolic (congestive) heart failure: Secondary | ICD-10-CM | POA: Diagnosis present

## 2013-12-03 DIAGNOSIS — E785 Hyperlipidemia, unspecified: Secondary | ICD-10-CM | POA: Diagnosis present

## 2013-12-03 DIAGNOSIS — K449 Diaphragmatic hernia without obstruction or gangrene: Secondary | ICD-10-CM | POA: Diagnosis present

## 2013-12-03 DIAGNOSIS — G4730 Sleep apnea, unspecified: Secondary | ICD-10-CM | POA: Diagnosis present

## 2013-12-03 DIAGNOSIS — Z8673 Personal history of transient ischemic attack (TIA), and cerebral infarction without residual deficits: Secondary | ICD-10-CM

## 2013-12-03 DIAGNOSIS — Z87891 Personal history of nicotine dependence: Secondary | ICD-10-CM

## 2013-12-03 DIAGNOSIS — Z8711 Personal history of peptic ulcer disease: Secondary | ICD-10-CM

## 2013-12-03 DIAGNOSIS — Z6833 Body mass index (BMI) 33.0-33.9, adult: Secondary | ICD-10-CM

## 2013-12-03 DIAGNOSIS — G40909 Epilepsy, unspecified, not intractable, without status epilepticus: Secondary | ICD-10-CM | POA: Diagnosis present

## 2013-12-03 DIAGNOSIS — M545 Low back pain, unspecified: Secondary | ICD-10-CM | POA: Diagnosis present

## 2013-12-03 DIAGNOSIS — K219 Gastro-esophageal reflux disease without esophagitis: Secondary | ICD-10-CM | POA: Diagnosis present

## 2013-12-03 DIAGNOSIS — Z87442 Personal history of urinary calculi: Secondary | ICD-10-CM

## 2013-12-03 DIAGNOSIS — E669 Obesity, unspecified: Secondary | ICD-10-CM | POA: Diagnosis present

## 2013-12-03 DIAGNOSIS — Z96649 Presence of unspecified artificial hip joint: Secondary | ICD-10-CM

## 2013-12-03 DIAGNOSIS — Z01812 Encounter for preprocedural laboratory examination: Secondary | ICD-10-CM

## 2013-12-03 DIAGNOSIS — I10 Essential (primary) hypertension: Secondary | ICD-10-CM | POA: Diagnosis present

## 2013-12-03 DIAGNOSIS — I252 Old myocardial infarction: Secondary | ICD-10-CM

## 2013-12-03 DIAGNOSIS — Z96659 Presence of unspecified artificial knee joint: Secondary | ICD-10-CM

## 2013-12-03 DIAGNOSIS — Z800 Family history of malignant neoplasm of digestive organs: Secondary | ICD-10-CM

## 2013-12-03 DIAGNOSIS — Z951 Presence of aortocoronary bypass graft: Secondary | ICD-10-CM

## 2013-12-03 DIAGNOSIS — M1610 Unilateral primary osteoarthritis, unspecified hip: Principal | ICD-10-CM | POA: Diagnosis present

## 2013-12-03 DIAGNOSIS — R339 Retention of urine, unspecified: Secondary | ICD-10-CM | POA: Diagnosis present

## 2013-12-03 DIAGNOSIS — J449 Chronic obstructive pulmonary disease, unspecified: Secondary | ICD-10-CM | POA: Diagnosis present

## 2013-12-03 DIAGNOSIS — D500 Iron deficiency anemia secondary to blood loss (chronic): Secondary | ICD-10-CM | POA: Diagnosis not present

## 2013-12-03 DIAGNOSIS — D62 Acute posthemorrhagic anemia: Secondary | ICD-10-CM | POA: Diagnosis not present

## 2013-12-03 DIAGNOSIS — M169 Osteoarthritis of hip, unspecified: Principal | ICD-10-CM | POA: Diagnosis present

## 2013-12-03 DIAGNOSIS — Z86718 Personal history of other venous thrombosis and embolism: Secondary | ICD-10-CM

## 2013-12-03 DIAGNOSIS — Z950 Presence of cardiac pacemaker: Secondary | ICD-10-CM

## 2013-12-03 DIAGNOSIS — G8929 Other chronic pain: Secondary | ICD-10-CM | POA: Diagnosis present

## 2013-12-03 DIAGNOSIS — I2510 Atherosclerotic heart disease of native coronary artery without angina pectoris: Secondary | ICD-10-CM | POA: Diagnosis present

## 2013-12-03 DIAGNOSIS — J4489 Other specified chronic obstructive pulmonary disease: Secondary | ICD-10-CM | POA: Diagnosis present

## 2013-12-03 HISTORY — PX: TOTAL HIP ARTHROPLASTY: SHX124

## 2013-12-03 LAB — GLUCOSE, CAPILLARY
Glucose-Capillary: 100 mg/dL — ABNORMAL HIGH (ref 70–99)
Glucose-Capillary: 68 mg/dL — ABNORMAL LOW (ref 70–99)
Glucose-Capillary: 80 mg/dL (ref 70–99)
Glucose-Capillary: 132 mg/dL — ABNORMAL HIGH (ref 70–99)

## 2013-12-03 LAB — TYPE AND SCREEN
ABO/RH(D): O NEG
Antibody Screen: NEGATIVE

## 2013-12-03 LAB — ABO/RH: ABO/RH(D): O NEG

## 2013-12-03 SURGERY — ARTHROPLASTY, HIP, TOTAL, ANTERIOR APPROACH
Anesthesia: Spinal | Site: Hip | Laterality: Left

## 2013-12-03 MED ORDER — HYDROMORPHONE HCL PF 1 MG/ML IJ SOLN: 0.2500 mg | INTRAMUSCULAR | Status: DC | PRN

## 2013-12-03 MED ORDER — EPHEDRINE SULFATE 50 MG/ML IJ SOLN
INTRAMUSCULAR | Status: AC
  Filled 2013-12-03: qty 1

## 2013-12-03 MED ORDER — CEFAZOLIN SODIUM-DEXTROSE 2-3 GM-% IV SOLR
INTRAVENOUS | Status: AC
  Filled 2013-12-03: qty 50

## 2013-12-03 MED ORDER — METHOCARBAMOL 100 MG/ML IJ SOLN
500.0000 mg | Freq: Four times a day (QID) | INTRAVENOUS | Status: DC | PRN
  Administered 2013-12-03: 500 mg via INTRAVENOUS
  Filled 2013-12-03: qty 5

## 2013-12-03 MED ORDER — 0.9 % SODIUM CHLORIDE (POUR BTL) OPTIME
TOPICAL | Status: DC | PRN
  Administered 2013-12-03: 1000 mL

## 2013-12-03 MED ORDER — FLEET ENEMA 7-19 GM/118ML RE ENEM: 1.0000 | ENEMA | Freq: Once | RECTAL | Status: AC | PRN

## 2013-12-03 MED ORDER — PANTOPRAZOLE SODIUM 40 MG PO TBEC: 40.0000 mg | DELAYED_RELEASE_TABLET | Freq: Every day | ORAL | Status: DC

## 2013-12-03 MED ORDER — CEFAZOLIN SODIUM-DEXTROSE 2-3 GM-% IV SOLR
2.0000 g | Freq: Four times a day (QID) | INTRAVENOUS | Status: AC
  Administered 2013-12-03 – 2013-12-04 (×2): 2 g via INTRAVENOUS
  Filled 2013-12-03 (×2): qty 50

## 2013-12-03 MED ORDER — METHOCARBAMOL 500 MG PO TABS
500.0000 mg | ORAL_TABLET | Freq: Four times a day (QID) | ORAL | Status: DC | PRN
  Administered 2013-12-04: 08:00:00 500 mg via ORAL
  Filled 2013-12-03: qty 1

## 2013-12-03 MED ORDER — ZOLPIDEM TARTRATE 5 MG PO TABS: 5.0000 mg | ORAL_TABLET | Freq: Every evening | ORAL | Status: DC | PRN

## 2013-12-03 MED ORDER — INSULIN ASPART 100 UNIT/ML ~~LOC~~ SOLN: 0.0000 [IU] | Freq: Three times a day (TID) | SUBCUTANEOUS | Status: DC

## 2013-12-03 MED ORDER — PROMETHAZINE HCL 25 MG PO TABS: 12.5000 mg | ORAL_TABLET | Freq: Four times a day (QID) | ORAL | Status: DC | PRN

## 2013-12-03 MED ORDER — CELECOXIB 200 MG PO CAPS
200.0000 mg | ORAL_CAPSULE | Freq: Two times a day (BID) | ORAL | Status: DC
Start: 2013-12-03 — End: 2013-12-06
  Administered 2013-12-03 – 2013-12-06 (×6): 200 mg via ORAL
  Filled 2013-12-03 (×7): qty 1

## 2013-12-03 MED ORDER — POTASSIUM CHLORIDE CRYS ER 10 MEQ PO TBCR
10.0000 meq | EXTENDED_RELEASE_TABLET | Freq: Every day | ORAL | Status: DC
  Filled 2013-12-03: qty 1

## 2013-12-03 MED ORDER — POLYETHYLENE GLYCOL 3350 17 G PO PACK
17.0000 g | PACK | Freq: Two times a day (BID) | ORAL | Status: DC
  Administered 2013-12-03 – 2013-12-06 (×5): 17 g via ORAL

## 2013-12-03 MED ORDER — PROPOFOL INFUSION 10 MG/ML OPTIME
INTRAVENOUS | Status: DC | PRN
  Administered 2013-12-03: 100 ug/kg/min via INTRAVENOUS

## 2013-12-03 MED ORDER — PANTOPRAZOLE SODIUM 40 MG PO TBEC
40.0000 mg | DELAYED_RELEASE_TABLET | Freq: Every day | ORAL | Status: DC
  Administered 2013-12-04 – 2013-12-06 (×3): 40 mg via ORAL
  Filled 2013-12-03 (×3): qty 1

## 2013-12-03 MED ORDER — PHENOL 1.4 % MT LIQD: 1.0000 | OROMUCOSAL | Status: DC | PRN

## 2013-12-03 MED ORDER — LACTATED RINGERS IV SOLN: INTRAVENOUS | Status: DC

## 2013-12-03 MED ORDER — ONDANSETRON HCL 4 MG/2ML IJ SOLN
INTRAMUSCULAR | Status: AC
  Filled 2013-12-03: qty 2

## 2013-12-03 MED ORDER — METOPROLOL TARTRATE 25 MG PO TABS
25.0000 mg | ORAL_TABLET | Freq: Two times a day (BID) | ORAL | Status: AC
  Administered 2013-12-03: 25 mg via ORAL
  Filled 2013-12-03: qty 1

## 2013-12-03 MED ORDER — FERROUS SULFATE 325 (65 FE) MG PO TABS
325.0000 mg | ORAL_TABLET | Freq: Three times a day (TID) | ORAL | Status: DC
Start: 2013-12-03 — End: 2013-12-06
  Administered 2013-12-04 – 2013-12-06 (×7): 325 mg via ORAL
  Filled 2013-12-03 (×11): qty 1

## 2013-12-03 MED ORDER — NITROGLYCERIN 0.4 MG SL SUBL: 0.4000 mg | SUBLINGUAL_TABLET | SUBLINGUAL | Status: DC | PRN

## 2013-12-03 MED ORDER — MIDAZOLAM HCL 2 MG/2ML IJ SOLN
INTRAMUSCULAR | Status: AC
  Filled 2013-12-03: qty 2

## 2013-12-03 MED ORDER — CEFAZOLIN SODIUM-DEXTROSE 2-3 GM-% IV SOLR
2.0000 g | INTRAVENOUS | Status: AC
  Administered 2013-12-03: 2 g via INTRAVENOUS

## 2013-12-03 MED ORDER — DIPHENHYDRAMINE HCL 25 MG PO CAPS
25.0000 mg | ORAL_CAPSULE | Freq: Four times a day (QID) | ORAL | Status: DC | PRN
  Administered 2013-12-06: 25 mg via ORAL
  Filled 2013-12-03: qty 1

## 2013-12-03 MED ORDER — SODIUM CHLORIDE 0.9 % IV SOLN
100.0000 mL/h | INTRAVENOUS | Status: DC
  Administered 2013-12-03: 100 mL/h via INTRAVENOUS
  Filled 2013-12-03 (×3): qty 1000

## 2013-12-03 MED ORDER — METOPROLOL TARTRATE 25 MG PO TABS
25.0000 mg | ORAL_TABLET | Freq: Two times a day (BID) | ORAL | Status: DC
  Administered 2013-12-03 – 2013-12-06 (×6): 25 mg via ORAL
  Filled 2013-12-03 (×7): qty 1

## 2013-12-03 MED ORDER — BUPIVACAINE IN DEXTROSE 0.75-8.25 % IT SOLN
INTRATHECAL | Status: DC | PRN
  Administered 2013-12-03: 2 mL via INTRATHECAL

## 2013-12-03 MED ORDER — KETOROLAC TROMETHAMINE 0.5 % OP SOLN
1.0000 [drp] | Freq: Three times a day (TID) | OPHTHALMIC | Status: DC
  Administered 2013-12-03 – 2013-12-06 (×8): 1 [drp] via OPHTHALMIC
  Filled 2013-12-03: qty 3

## 2013-12-03 MED ORDER — PROMETHAZINE HCL 25 MG/ML IJ SOLN: 6.2500 mg | INTRAMUSCULAR | Status: DC | PRN

## 2013-12-03 MED ORDER — HYDROMORPHONE HCL PF 2 MG/ML IJ SOLN
INTRAMUSCULAR | Status: AC
  Filled 2013-12-03: qty 1

## 2013-12-03 MED ORDER — OMEGA-3-ACID ETHYL ESTERS 1 G PO CAPS
1.0000 g | ORAL_CAPSULE | Freq: Every day | ORAL | Status: DC
  Administered 2013-12-03 – 2013-12-06 (×4): 1 g via ORAL
  Filled 2013-12-03 (×4): qty 1

## 2013-12-03 MED ORDER — HYDROCODONE-ACETAMINOPHEN 7.5-325 MG PO TABS
1.0000 | ORAL_TABLET | ORAL | Status: DC
  Administered 2013-12-03 (×2): 1 via ORAL
  Administered 2013-12-03 – 2013-12-04 (×4): 2 via ORAL
  Filled 2013-12-03: qty 2
  Filled 2013-12-03 (×2): qty 1
  Filled 2013-12-03 (×4): qty 2

## 2013-12-03 MED ORDER — DOCUSATE SODIUM 100 MG PO CAPS
100.0000 mg | ORAL_CAPSULE | Freq: Two times a day (BID) | ORAL | Status: DC
  Administered 2013-12-03 – 2013-12-06 (×6): 100 mg via ORAL

## 2013-12-03 MED ORDER — FENTANYL CITRATE 0.05 MG/ML IJ SOLN
INTRAMUSCULAR | Status: AC
  Filled 2013-12-03: qty 2

## 2013-12-03 MED ORDER — METFORMIN HCL 500 MG PO TABS: 500.0000 mg | ORAL_TABLET | Freq: Two times a day (BID) | ORAL | Status: DC

## 2013-12-03 MED ORDER — BISACODYL 10 MG RE SUPP: 10.0000 mg | Freq: Every day | RECTAL | Status: DC | PRN

## 2013-12-03 MED ORDER — FUROSEMIDE 40 MG PO TABS
40.0000 mg | ORAL_TABLET | Freq: Every morning | ORAL | Status: DC
  Administered 2013-12-04 – 2013-12-06 (×3): 40 mg via ORAL
  Filled 2013-12-03 (×3): qty 1

## 2013-12-03 MED ORDER — MENTHOL 3 MG MT LOZG: 1.0000 | LOZENGE | OROMUCOSAL | Status: DC | PRN

## 2013-12-03 MED ORDER — ONDANSETRON HCL 4 MG/2ML IJ SOLN
4.0000 mg | Freq: Four times a day (QID) | INTRAMUSCULAR | Status: DC | PRN
  Administered 2013-12-04: 4 mg via INTRAVENOUS
  Filled 2013-12-03: qty 2

## 2013-12-03 MED ORDER — FENTANYL CITRATE 0.05 MG/ML IJ SOLN
INTRAMUSCULAR | Status: DC | PRN
  Administered 2013-12-03 (×2): 25 ug via INTRAVENOUS

## 2013-12-03 MED ORDER — LACTATED RINGERS IV SOLN
INTRAVENOUS | Status: DC | PRN
  Administered 2013-12-03 (×2): via INTRAVENOUS

## 2013-12-03 MED ORDER — ALUM & MAG HYDROXIDE-SIMETH 200-200-20 MG/5ML PO SUSP: 30.0000 mL | ORAL | Status: DC | PRN

## 2013-12-03 MED ORDER — PHENYLEPHRINE HCL 10 MG/ML IJ SOLN
INTRAMUSCULAR | Status: AC
  Filled 2013-12-03: qty 1

## 2013-12-03 MED ORDER — LEVETIRACETAM 500 MG PO TABS
500.0000 mg | ORAL_TABLET | Freq: Two times a day (BID) | ORAL | Status: DC
  Administered 2013-12-03 – 2013-12-06 (×6): 500 mg via ORAL
  Filled 2013-12-03 (×7): qty 1

## 2013-12-03 MED ORDER — OXYBUTYNIN CHLORIDE ER 10 MG PO TB24
10.0000 mg | ORAL_TABLET | Freq: Every morning | ORAL | Status: DC
  Administered 2013-12-04 – 2013-12-06 (×2): 10 mg via ORAL
  Filled 2013-12-03 (×3): qty 1

## 2013-12-03 MED ORDER — RIVAROXABAN 10 MG PO TABS
10.0000 mg | ORAL_TABLET | ORAL | Status: DC
  Administered 2013-12-04 – 2013-12-05 (×2): 10 mg via ORAL
  Filled 2013-12-03 (×3): qty 1

## 2013-12-03 MED ORDER — PROMETHAZINE HCL 25 MG/ML IJ SOLN
12.5000 mg | Freq: Four times a day (QID) | INTRAMUSCULAR | Status: DC | PRN
  Filled 2013-12-03: qty 1

## 2013-12-03 MED ORDER — EPHEDRINE SULFATE 50 MG/ML IJ SOLN
INTRAMUSCULAR | Status: DC | PRN
  Administered 2013-12-03 (×2): 10 mg via INTRAVENOUS

## 2013-12-03 MED ORDER — PROPOFOL 10 MG/ML IV BOLUS
INTRAVENOUS | Status: AC
  Filled 2013-12-03: qty 20

## 2013-12-03 MED ORDER — MIDAZOLAM HCL 5 MG/5ML IJ SOLN
INTRAMUSCULAR | Status: DC | PRN
  Administered 2013-12-03 (×2): 1 mg via INTRAVENOUS

## 2013-12-03 MED ORDER — HYDROMORPHONE HCL PF 1 MG/ML IJ SOLN
0.5000 mg | INTRAMUSCULAR | Status: DC | PRN
  Administered 2013-12-03 (×2): 1 mg via INTRAVENOUS
  Filled 2013-12-03 (×2): qty 1

## 2013-12-03 MED ORDER — PHENYLEPHRINE HCL 10 MG/ML IJ SOLN
10.0000 mg | INTRAVENOUS | Status: DC | PRN
  Administered 2013-12-03: 40 ug/min via INTRAVENOUS

## 2013-12-03 MED ORDER — ONDANSETRON HCL 4 MG PO TABS: 4.0000 mg | ORAL_TABLET | Freq: Four times a day (QID) | ORAL | Status: DC | PRN

## 2013-12-03 SURGICAL SUPPLY — 38 items
BAG ZIPLOCK 12X15 (MISCELLANEOUS) ×2 IMPLANT
BLADE SAW SGTL 18X1.27X75 (BLADE) ×2 IMPLANT
CAPT HIP PF MOP ×2 IMPLANT
DERMABOND ADVANCED (GAUZE/BANDAGES/DRESSINGS) ×1
DERMABOND ADVANCED .7 DNX12 (GAUZE/BANDAGES/DRESSINGS) ×1 IMPLANT
DRAPE C-ARM 42X120 X-RAY (DRAPES) ×2 IMPLANT
DRAPE POUCH INSTRU U-SHP 10X18 (DRAPES) ×2 IMPLANT
DRAPE STERI IOBAN 125X83 (DRAPES) ×2 IMPLANT
DRAPE U-SHAPE 47X51 STRL (DRAPES) ×6 IMPLANT
DRSG AQUACEL AG ADV 3.5X10 (GAUZE/BANDAGES/DRESSINGS) ×2 IMPLANT
DRSG TEGADERM 4X4.75 (GAUZE/BANDAGES/DRESSINGS) IMPLANT
DURAPREP 26ML APPLICATOR (WOUND CARE) ×2 IMPLANT
ELECT BLADE TIP CTD 4 INCH (ELECTRODE) ×2 IMPLANT
ELECT REM PT RETURN 9FT ADLT (ELECTROSURGICAL) ×2
ELECTRODE REM PT RTRN 9FT ADLT (ELECTROSURGICAL) ×1 IMPLANT
EVACUATOR 1/8 PVC DRAIN (DRAIN) IMPLANT
FACESHIELD LNG OPTICON STERILE (SAFETY) ×8 IMPLANT
GAUZE SPONGE 2X2 8PLY STRL LF (GAUZE/BANDAGES/DRESSINGS) IMPLANT
GLOVE BIOGEL PI IND STRL 7.5 (GLOVE) ×2 IMPLANT
GLOVE BIOGEL PI IND STRL 8 (GLOVE) ×1 IMPLANT
GLOVE BIOGEL PI INDICATOR 7.5 (GLOVE) ×2
GLOVE BIOGEL PI INDICATOR 8 (GLOVE) ×1
GLOVE ECLIPSE 8.0 STRL XLNG CF (GLOVE) ×2 IMPLANT
GLOVE ORTHO TXT STRL SZ7.5 (GLOVE) ×4 IMPLANT
GOWN BRE IMP PREV XXLGXLNG (GOWN DISPOSABLE) ×2 IMPLANT
GOWN PREVENTION PLUS LG XLONG (DISPOSABLE) ×2 IMPLANT
KIT BASIN OR (CUSTOM PROCEDURE TRAY) ×2 IMPLANT
PACK TOTAL JOINT (CUSTOM PROCEDURE TRAY) ×2 IMPLANT
PADDING CAST COTTON 6X4 STRL (CAST SUPPLIES) ×2 IMPLANT
SPONGE GAUZE 2X2 STER 10/PKG (GAUZE/BANDAGES/DRESSINGS)
SUT MNCRL AB 4-0 PS2 18 (SUTURE) ×2 IMPLANT
SUT VIC AB 1 CT1 36 (SUTURE) ×6 IMPLANT
SUT VIC AB 2-0 CT1 27 (SUTURE) ×2
SUT VIC AB 2-0 CT1 TAPERPNT 27 (SUTURE) ×2 IMPLANT
SUT VLOC 180 0 24IN GS25 (SUTURE) ×2 IMPLANT
TOWEL OR 17X26 10 PK STRL BLUE (TOWEL DISPOSABLE) ×2 IMPLANT
TOWEL OR NON WOVEN STRL DISP B (DISPOSABLE) ×2 IMPLANT
TRAY FOLEY CATH 14FRSI W/METER (CATHETERS) ×2 IMPLANT

## 2013-12-03 NOTE — Preoperative (Addendum)
Beta Blockers   Reason not to administer Beta Blockers:Not Applicable 

## 2013-12-03 NOTE — Anesthesia Postprocedure Evaluation (Signed)
  Anesthesia Post-op Note  Patient: [name]  Procedure(s) Performed: Procedure(s) (LRB): LEFT TOTAL HIP ARTHROPLASTY ANTERIOR APPROACH (Left)  Patient Location: PACU  Anesthesia Type: Spinal  Level of Consciousness: awake and alert   Airway and Oxygen Therapy: Patient Spontanous Breathing  Post-op Pain: mild  Post-op Assessment: Post-op Vital signs reviewed, Patient's Cardiovascular Status Stable, Respiratory Function Stable, Patent Airway and No signs of Nausea or vomiting  Last Vitals:  Filed Vitals:   12/03/13 1735  BP: 171/95  Pulse: 61  Temp: 36.4 C  Resp: 18    Post-op Vital Signs: stable   Complications: No apparent anesthesia complications

## 2013-12-03 NOTE — Interval H&P Note (Signed)
History and Physical Interval Note:  12/03/2013 12:34 PM  [name]  has presented today for surgery, with the diagnosis of LEFT HIP OA  The various methods of treatment have been discussed with the patient and family. After consideration of risks, benefits and other options for treatment, the patient has consented to  Procedure(s): LEFT TOTAL HIP ARTHROPLASTY ANTERIOR APPROACH (Left) as a surgical intervention .  The patient's history has been reviewed, patient examined, no change in status, stable for surgery.  I have reviewed the patient's chart and labs.  Questions were answered to the patient's satisfaction.     Shelda Pal

## 2013-12-03 NOTE — Anesthesia Preprocedure Evaluation (Addendum)
Anesthesia Evaluation  Patient identified by MRN, date of birth, ID band Patient awake  General Assessment Comment: Preop cardiovascular exam  10/30/2013   .  Protein-calorie malnutrition, severe  10/02/2013   .  Precordial pain  09/30/2013   .  Failure to thrive  07/26/2013   .  Anemia  07/13/2013   .  Altered mental state  07/13/2013   .  NSTEMI (non-ST elevated myocardial infarction)  07/01/2013   .  Acute delirium  06/29/2013   .  Unspecified constipation  06/29/2013   .  Total knee replacement status  05/22/2013   .  H/O total hip arthroplasty  05/22/2013   .  Spinal stenosis, lumbar region, with neurogenic claudication  05/22/2013   .  Osteoarthritis of left hip  05/22/2013   .  Noninfectious gastroenteritis and colitis  03/05/2013   .  Dehydration  03/05/2013   .  Family history of colon cancer  02/15/2013   .  PUD (peptic ulcer disease)  02/15/2013   .  Diabetes mellitus, type II     .  Hypertension     .  Arteriosclerotic cardiovascular disease (ASCVD)     .  Gastroesophageal reflux disease     .  Hyperlipidemia     .  Deep vein thrombosis, upper right extremity     .  Obesity     .  AV block     .  COPD (chronic obstructive pulmonary disease)     .  Sleep apnea     .  Seizure disorder     .  Anemia, normocytic normochromic  12/15/2011   .  Chronic diastolic heart failure  11/18/2011   .  PACEMAKER, PERMANENT  05/13/2009   .  SCHATZKI'S RING  05/12/2009   .  GASTRIC ULCER  05/12/2009   .  Gastroparesis  05/12/2009   .  DIVERTICULAR DISEASE     Reviewed: Allergy & Precautions, H&P , NPO status , Patient's Chart, lab work & pertinent test results  Airway Mallampati: II TM Distance: >3 FB Neck ROM: Full    Dental no notable dental hx.    Pulmonary shortness of breath and Long-Term Oxygen Therapy, asthma , sleep apnea and Oxygen sleep apnea , COPD oxygen dependent,  Oxygen at night sometimes.  She states her breathing is  OK and at baseline. breath sounds clear to auscultation  Pulmonary exam normal       Cardiovascular hypertension, Pt. on medications and Pt. on home beta blockers + angina + CAD, + Past MI, + Peripheral Vascular Disease and + Orthopnea negative cardio ROS  + dysrhythmias + pacemaker Rhythm:Regular Rate:Normal  Last cardiology visit 10-30-13 for clearance and pacemaker check.  Stress myoview OK in 2012   Neuro/Psych  Headaches, Seizures -,  PSYCHIATRIC DISORDERS Spinal stenosis. Chronic back pain. CVA    GI/Hepatic Neg liver ROS, hiatal hernia, PUD, GERD-  Medicated,  Endo/Other  diabetes, Type 2, Oral Hypoglycemic Agents  Renal/GU negative Renal ROS  negative genitourinary   Musculoskeletal negative musculoskeletal ROS (+)   Abdominal   Peds negative pediatric ROS (+)  Hematology  (+) anemia ,   Anesthesia Other Findings   Reproductive/Obstetrics negative OB ROS                        Anesthesia Physical Anesthesia Plan  ASA: III  Anesthesia Plan: Spinal   Post-op Pain Management:    Induction: Intravenous  Airway Management Planned:  Additional Equipment:   Intra-op Plan:   Post-operative Plan: Extubation in OR  Informed Consent: I have reviewed the patients History and Physical, chart, labs and discussed the procedure including the risks, benefits and alternatives for the proposed anesthesia with the patient or authorized representative who has indicated his/her understanding and acceptance.   Dental advisory given  Plan Discussed with: CRNA  Anesthesia Plan Comments: (Discussed r/b general versus spinal. Spinal may be preferable with her co-morbidities. Discussed risks/benefits of spinal including headache, backache, failure, bleeding, infection, and nerve damage. Patient consents to spinal. Questions answered. Coagulation studies and platelet count acceptable.)       Anesthesia Quick Evaluation

## 2013-12-03 NOTE — Anesthesia Procedure Notes (Signed)
Spinal  Start time: 12/03/2013 1:50 PM End time: 12/03/2013 2:00 PM Staffing CRNA/Resident: Lavone Orn, Tameyah Koch E Performed by: resident/CRNA  Preanesthetic Checklist Completed: patient identified, site marked, surgical consent, pre-op evaluation, timeout performed, IV checked, risks and benefits discussed and monitors and equipment checked Spinal Block Patient position: sitting Prep: Betadine Patient monitoring: heart rate, continuous pulse ox and blood pressure Approach: right paramedian Location: L2-3 Injection technique: single-shot Needle Needle type: Sprotte  Needle gauge: 22 G Needle length: 9 cm Assessment Sensory level: T8 Additional Notes Spinal kit expiration checked, time out, sterile prep and drape. Attept w/o success midline, L2-3 Rt. Paramedian w/ positive clear CSF pre/post injection, no heme. Patient tol well, return to supine.

## 2013-12-03 NOTE — Progress Notes (Signed)
PHARMACIST - PHYSICIAN COMMUNICATION DR:  Charlann Boxer CONCERNING:  METFORMIN SAFE ADMINISTRATION POLICY  RECOMMENDATION: Metformin has been placed on DISCONTINUE (rejected order) STATUS and should be reordered only after any of the conditions below are ruled out.  Current safety recommendations include avoiding metformin for a minimum of 48 hours after the patient's exposure to intravenous contrast media.  DESCRIPTION:  The Pharmacy Committee has adopted a policy that restricts the use of metformin in hospitalized patients until all the contraindications to administration have been ruled out. Specific contraindications are: []  Serum creatinine ? 1.5 for males [x]  Serum creatinine ? 1.4 for females []  Shock, acute MI, sepsis, hypoxemia, dehydration []  Planned administration of intravenous iodinated contrast media []  Heart Failure patients with low EF []  Acute or chronic metabolic acidosis (including DKA)       Clance Boll, PharmD, BCPS Pager: [phone] 12/03/2013 6:14 PM

## 2013-12-03 NOTE — Op Note (Signed)
NAME:  [name]                ACCOUNT NO.: [id number]      MEDICAL RECORD NO.: [id number]      FACILITY:  [facility]      PHYSICIAN:  Durene Romans D  DATE OF BIRTH:  Nov 28, 1941     DATE OF PROCEDURE:  12/03/2013                                 OPERATIVE REPORT         PREOPERATIVE DIAGNOSIS: Left  hip osteoarthritis.      POSTOPERATIVE DIAGNOSIS:  Left hip osteoarthritis.      PROCEDURE:  Left total hip replacement through an anterior approach   utilizing DePuy THR system, component size 50mm pinnacle cup, a size 32+4 neutral   Altrex liner, a size 3 Hi Tri Lock stem with a 32+1 Articuleze metal ball.    SURGEON:  Madlyn Frankel. Charlann Boxer, M.D.      ASSISTANT:  Lanney Gins, PA-C      ANESTHESIA:  Spinal.      SPECIMENS:  None.      COMPLICATIONS:  None.      BLOOD LOSS:  250 cc     DRAINS: None.      INDICATION OF THE PROCEDURE:  [name] is a 72 y.o. female who had   presented to office for evaluation of left hip pain.  Radiographs revealed   progressive degenerative changes with bone-on-bone   articulation to the  hip joint.  The patient had painful limited range of   motion significantly affecting their overall quality of life.  The patient was failing to    respond to conservative measures, and at this point was ready   to proceed with more definitive measures.  The patient has noted progressive   degenerative changes in his hip, progressive problems and dysfunction   with regarding the hip prior to surgery.  Consent was obtained for   benefit of pain relief.  Specific risk of infection, DVT, component   failure, dislocation, need for revision surgery, as well discussion of   the anterior versus posterior approach were reviewed.  Consent was   obtained for benefit of anterior pain relief through an anterior   approach.      PROCEDURE IN DETAIL:  The patient was brought to operative theater.   Once adequate anesthesia, preoperative  antibiotics, 2gm Ancef administered.   The patient was positioned supine on the OSI Hanna table.  Once adequate   padding of boney process was carried out, we had predraped out the hip, and  used fluoroscopy to confirm orientation of the pelvis and position.      The left hip was then prepped and draped from proximal iliac crest to   mid thigh with shower curtain technique.      Time-out was performed identifying the patient, planned procedure, and   extremity.     An incision was then made 2 cm distal and lateral to the   anterior superior iliac spine extending over the orientation of the   tensor fascia lata muscle and sharp dissection was carried down to the   fascia of the muscle and protractor placed in the soft tissues.      The fascia was then incised.  The muscle belly was identified and swept   laterally and retractor  placed along the superior neck.  Following   cauterization of the circumflex vessels and removing some pericapsular   fat, a second cobra retractor was placed on the inferior neck.  A third   retractor was placed on the anterior acetabulum after elevating the   anterior rectus.  A L-capsulotomy was along the line of the   superior neck to the trochanteric fossa, then extended proximally and   distally.  Tag sutures were placed and the retractors were then placed   intracapsular.  We then identified the trochanteric fossa and   orientation of my neck cut, confirmed this radiographically   and then made a neck osteotomy with the femur on traction.  The femoral   head was removed without difficulty or complication.  Traction was let   off and retractors were placed posterior and anterior around the   acetabulum.      The labrum and foveal tissue were debrided.  I began reaming with a 45mm   reamer and reamed up to 49mm reamer with good bony bed preparation and a 50   cup was chosen.  The final 50mm Pinnacle cup was then impacted under fluoroscopy  to confirm the  depth of penetration and orientation with respect to   abduction.  A screw was placed followed by the hole eliminator.  The final   32+4 neutral Altrex liner was impacted with good visualized rim fit.  The cup was positioned anatomically within the acetabular portion of the pelvis.      At this point, the femur was rolled at 80 degrees.  Further capsule was   released off the inferior aspect of the femoral neck.  I then   released the superior capsule proximally.  The hook was placed laterally   along the femur and elevated manually and held in position with the bed   hook.  The leg was then extended and adducted with the leg rolled to 100   degrees of external rotation.  Once the proximal femur was fully   exposed, I used a box osteotome to set orientation.  I then began   broaching with the starting chili pepper broach and passed this by hand and then broached up to 3.  With the 3 broach in place I chose a high offset neck and did a trial reduction.  The offset was appropriate, leg lengths   appeared to be equal, confirmed radiographically.   Given these findings, I went ahead and dislocated the hip, repositioned all   retractors and positioned the right hip in the extended and abducted position.  The final 3 Hi Tri Lock stem was   chosen and it was impacted down to the level of neck cut.  Based on this   and the trial reduction, a 32+1 Articuleze metal ball was chosen and   impacted onto a clean and dry trunnion, and the hip was reduced.  The   hip had been irrigated throughout the case again at this point.  I did   reapproximate the superior capsular leaflet to the anterior leaflet   using #1 Vicryl.  The fascia of the   tensor fascia lata muscle was then reapproximated using #1 Vicryl.  The   remaining wound was closed with 2-0 Vicryl and running 4-0 Monocryl.   The hip was cleaned, dried, and dressed sterilely using Dermabond and   Aquacel dressing.  She was then brought   to recovery  room in stable condition tolerating the procedure well.  Lanney Gins, PA-C was present for the entirety of the case involved from   preoperative positioning, perioperative retractor management, general   facilitation of the case, as well as primary wound closure as assistant.            Madlyn Frankel Charlann Boxer, M.D.        12/03/2013 3:10 PM

## 2013-12-03 NOTE — Transfer of Care (Signed)
Immediate Anesthesia Transfer of Care Note  Patient: [name]  Procedure(s) Performed: Procedure(s): LEFT TOTAL HIP ARTHROPLASTY ANTERIOR APPROACH (Left)  Patient Location: PACU  Anesthesia Type:Spinal  Level of Consciousness: awake, alert  and oriented  Airway & Oxygen Therapy: Patient Spontanous Breathing and Patient connected to face mask oxygen  Post-op Assessment: Report given to PACU RN and Post -op Vital signs reviewed and stable  Post vital signs: Reviewed and stable  Complications: No apparent anesthesia complications

## 2013-12-04 ENCOUNTER — Encounter (HOSPITAL_COMMUNITY): Payer: Self-pay | Admitting: Orthopedic Surgery

## 2013-12-04 DIAGNOSIS — D500 Iron deficiency anemia secondary to blood loss (chronic): Secondary | ICD-10-CM | POA: Diagnosis not present

## 2013-12-04 DIAGNOSIS — E669 Obesity, unspecified: Secondary | ICD-10-CM | POA: Diagnosis present

## 2013-12-04 LAB — GLUCOSE, CAPILLARY
Glucose-Capillary: 113 mg/dL — ABNORMAL HIGH (ref 70–99)
Glucose-Capillary: 111 mg/dL — ABNORMAL HIGH (ref 70–99)
Glucose-Capillary: 123 mg/dL — ABNORMAL HIGH (ref 70–99)

## 2013-12-04 LAB — BASIC METABOLIC PANEL
CO2: 19 mEq/L (ref 19–32)
Calcium: 9.1 mg/dL (ref 8.4–10.5)
Chloride: 103 mEq/L (ref 96–112)
Creatinine, Ser: 1.49 mg/dL — ABNORMAL HIGH (ref 0.50–1.10)
GFR calc non Af Amer: 33 mL/min — ABNORMAL LOW (ref >90)
Glucose, Bld: 117 mg/dL — ABNORMAL HIGH (ref 70–99)
Sodium: 138 mEq/L (ref 137–147)

## 2013-12-04 LAB — CBC
Platelets: 196 10*3/uL (ref 150–400)
RBC: 2.93 MIL/uL — ABNORMAL LOW (ref 3.87–5.11)
RDW: 15 % (ref 11.5–15.5)
WBC: 7.3 10*3/uL (ref 4.0–10.5)

## 2013-12-04 MED ORDER — POTASSIUM CHLORIDE CRYS ER 10 MEQ PO TBCR
10.0000 meq | EXTENDED_RELEASE_TABLET | Freq: Every day | ORAL | Status: DC
  Administered 2013-12-04 – 2013-12-06 (×3): 10 meq via ORAL
  Filled 2013-12-04 (×3): qty 1

## 2013-12-04 MED ORDER — HYDROMORPHONE HCL 2 MG PO TABS
2.0000 mg | ORAL_TABLET | ORAL | Status: DC | PRN
  Administered 2013-12-04 – 2013-12-05 (×2): 2 mg via ORAL
  Filled 2013-12-04 (×2): qty 1

## 2013-12-04 MED ORDER — SODIUM CHLORIDE 0.9 % IV BOLUS (SEPSIS)
250.0000 mL | Freq: Once | INTRAVENOUS | Status: AC
  Administered 2013-12-04: 08:00:00 250 mL via INTRAVENOUS

## 2013-12-04 MED ORDER — TRAMADOL HCL 50 MG PO TABS
50.0000 mg | ORAL_TABLET | Freq: Four times a day (QID) | ORAL | Status: DC | PRN
  Administered 2013-12-04 – 2013-12-05 (×2): 100 mg via ORAL
  Filled 2013-12-04 (×2): qty 2

## 2013-12-04 NOTE — Progress Notes (Signed)
   Subjective: 1 Day Post-Op Procedure(s) (LRB): LEFT TOTAL HIP ARTHROPLASTY ANTERIOR APPROACH (Left)   Patient reports pain as mild, pain controlled. No events throughout the night.   Objective:   VITALS:   Filed Vitals:   12/04/13 0529  BP: 104/68  Pulse: 62  Temp: 98 F (36.7 C)  Resp: 14    Neurovascular intact Dorsiflexion/Plantar flexion intact Incision: dressing C/D/I No cellulitis present Compartment soft  LABS  Recent Labs  12/04/13 0711  HGB 8.2*  HCT 25.9*  WBC 7.3  PLT 196     Recent Labs  12/04/13 0530  NA 138  K 5.6*  BUN 32*  CREATININE 1.49*  GLUCOSE 117*     Assessment/Plan: 1 Day Post-Op Procedure(s) (LRB): LEFT TOTAL HIP ARTHROPLASTY ANTERIOR APPROACH (Left) Advance diet Up with therapy D/C IV fluids Discharge home with home health eventually, when ready.  Expected ABLA  Treated with iron and will observe  Obese (BMI 30-39.9) Estimated body mass index is 33.65 kg/(m^2) as calculated from the following:   Height as of this encounter: 5\' 2"  (1.575 m).   Weight as of this encounter: 83.462 kg (184 lb). Patient also counseled that weight may inhibit the healing process Patient counseled that losing weight will help with future health issues         Anastasio Auerbach. Kyoko Elsea   PAC  12/04/2013, 9:05 AM

## 2013-12-04 NOTE — Care Management Note (Addendum)
    Page 1 of 2   12/06/2013     10:33:13 AM   CARE MANAGEMENT NOTE 12/06/2013  Patient:  [name], [name]   Account Number:  [id number]  Date Initiated:  12/04/2013  Documentation initiated by:  Colleen Can  Subjective/Objective Assessment:   dx left hip replacemnt-anterior approach     Action/Plan:   CM spoke with patient. Plans are for patient to return to her home in [place] where family members will be caregivers. She already has RW and commode seat.   Anticipated DC Date:  12/06/2012   Anticipated DC Plan:  HOME W HOME HEALTH SERVICES      DC Planning Services  CM consult      [facility] Choice  HOME HEALTH   Choice offered to / List presented to:  [name]        HH arranged  HH-2 PT      [facility] agency  [facility]   Status of service:  Completed, signed off Medicare Important Message given?  NA - LOS <3 / Initial given by admissions (If response is "NO", the following Medicare IM given date fields will be blank) Date Medicare IM given:   Date Additional Medicare IM given:    Discharge Disposition:  HOME W HOME HEALTH SERVICES  Per UR Regulation:    If discussed at Long Length of Stay Meetings, dates discussed:    Comments:  12/06/2012 Colleen Can BSN RN CCM [phone] Plans are for pt to discharge today with [facility] in place. Serives will start tomorrow  for HHpt 12/06/2013.   12/04/2013 Colleen Can BSN RN CCM [phone] CM called [facility] rep to request services. Advised by rep that [facility] could provice HHpt services. OT eval has not been completed.

## 2013-12-04 NOTE — Evaluation (Signed)
Physical Therapy Evaluation Patient Details Name: [name] MRN: [id number] DOB: [birth year] Today's Date: 12/04/2013 Time: 6962-9528 PT Time Calculation (min): 30 min  PT Assessment / Plan / Recommendation History of Present Illness     Clinical Impression  Pt is s/p L direct anterior THA resulting in the deficits listed below (see PT Problem List).  Pt will benefit from skilled PT to increase their independence and safety with mobility to allow discharge to the venue listed below.  Pt reports she plans to d/c home with family to assist 24/7.       PT Assessment  Patient needs continued PT services    Follow Up Recommendations  Home health PT;Supervision for mobility/OOB    Does the patient have the potential to tolerate intense rehabilitation      Barriers to Discharge        Equipment Recommendations  None recommended by PT    Recommendations for Other Services     Frequency 7X/week    Precautions / Restrictions Precautions Precautions: None Restrictions Other Position/Activity Restrictions: WBAT   Pertinent Vitals/Pain Minimal pain L hip at rest, repositioned, premedicated      Mobility  Bed Mobility Bed Mobility: Supine to Sit Supine to Sit: HOB elevated;5: Supervision Details for Bed Mobility Assistance: verbal cues for technique Transfers Transfers: Sit to Stand;Stand to Sit Sit to Stand: 4: Min assist;With upper extremity assist;From bed Stand to Sit: 4: Min assist;With upper extremity assist;To chair/3-in-1 Details for Transfer Assistance: verbal cues for safe technique Ambulation/Gait Ambulation/Gait Assistance: 4: Min assist;4: Min guard Ambulation Distance (Feet): 40 Feet Assistive device: Rolling walker Ambulation/Gait Assistance Details: initially had difficulty advancing L LE however improved over time, verbal cues for sequence, posture, RW distance; very slow ambulation, required increased time Gait Pattern: Decreased step length -  left;Decreased hip/knee flexion - left;Step-to pattern;Antalgic Gait velocity: decreased    Exercises     PT Diagnosis: Difficulty walking;Acute pain  PT Problem List: Decreased strength;Decreased mobility;Decreased knowledge of use of DME;Pain PT Treatment Interventions: Functional mobility training;Gait training;DME instruction;Patient/family education;Therapeutic activities;Therapeutic exercise     PT Goals(Current goals can be found in the care plan section) Acute Rehab PT Goals PT Goal Formulation: With patient Time For Goal Achievement: 12/07/13 Potential to Achieve Goals: Good  Visit Information  Last PT Received On: 12/04/13 Assistance Needed: +1       Prior Functioning  Home Living Family/patient expects to be discharged to:: Private residence Living Arrangements: Spouse/significant other;Children Available Help at Discharge: Family;Available 24 hours/day Type of Home: House Home Access: Ramped entrance Home Layout: One level Home Equipment: Walker - 2 wheels Prior Function Level of Independence: Independent with assistive device(s) Communication Communication: No difficulties    Cognition  Cognition Arousal/Alertness: Awake/alert Behavior During Therapy: WFL for tasks assessed/performed Overall Cognitive Status: Within Functional Limits for tasks assessed    Extremity/Trunk Assessment Lower Extremity Assessment Lower Extremity Assessment: LLE deficits/detail LLE Deficits / Details: decreased functional hip strength   Balance    End of Session PT - End of Session Equipment Utilized During Treatment: Gait belt Activity Tolerance: Patient limited by fatigue Patient left: in chair;with call bell/phone within reach  GP     Miquan Tandon,[name] 12/04/2013, 12:34 PM Zenovia Jarred, PT, DPT 12/04/2013 Pager: [phone]

## 2013-12-04 NOTE — Progress Notes (Signed)
Utilization review completed.  

## 2013-12-04 NOTE — Progress Notes (Signed)
Physical Therapy Treatment Note   12/04/13 1500  PT Visit Information  Last PT Received On 12/04/13  Assistance Needed +1  PT Time Calculation  PT Start Time 1409  PT Stop Time 1437  PT Time Calculation (min) 28 min  Subjective Data  Subjective Pt ambulated short distance in hallway.  Pt fatigued quickly this afternoon and assisted back to bed to perform supine exercises.  Precautions  Precautions None  Restrictions  Other Position/Activity Restrictions WBAT  Cognition  Arousal/Alertness Awake/alert  Behavior During Therapy WFL for tasks assessed/performed  Overall Cognitive Status Within Functional Limits for tasks assessed  Bed Mobility  Bed Mobility Sit to Supine  Sit to Supine 3: Mod assist;HOB flat  Details for Bed Mobility Assistance verbal cues for technique, required assist for bil LEs  Transfers  Transfers Sit to Stand;Stand to Sit  Sit to Stand 4: Min assist;With upper extremity assist;From chair/3-in-1  Stand to Sit With upper extremity assist;4: Min guard;To bed  Details for Transfer Assistance verbal cues for safe technique  Ambulation/Gait  Ambulation/Gait Assistance 4: Min guard  Ambulation Distance (Feet) 28 Feet  Assistive device Rolling walker  Ambulation/Gait Assistance Details difficulty initially with sequencing, requires increased time  Gait Pattern Decreased step length - left;Decreased hip/knee flexion - left;Step-to pattern;Antalgic  Gait velocity decreased  Exercises  Exercises Total Joint  Total Joint Exercises  Ankle Circles/Pumps AROM;10 reps;Both  Quad Sets AROM;10 reps;Both  Heel Slides Left;15 reps;AAROM  Hip ABduction/ADduction AAROM;Left;15 reps  PT - End of Session  Activity Tolerance Patient limited by fatigue  Patient left in bed;with call bell/phone within reach  PT - Assessment/Plan  PT Plan Current plan remains appropriate  PT Frequency 7X/week  Follow Up Recommendations Home health PT;Supervision for mobility/OOB  PT equipment  None recommended by PT  PT Goal Progression  Progress towards PT goals Progressing toward goals  PT General Charges  $$ ACUTE PT VISIT 1 Procedure  PT Treatments  $Gait Training 8-22 mins  $Therapeutic Exercise 8-22 mins   Zenovia Jarred, PT, DPT 12/04/2013 Pager: [phone]

## 2013-12-04 NOTE — Progress Notes (Signed)
OT Cancellation Note  Patient Details Name: [name] MRN: [id number] DOB: 04/21/1941   Cancelled Treatment:    Reason Eval/Treat Not Completed: Pt declined due to feeling sick, Will reattempt OT eval next day  Alba Cory 12/04/2013, 1:07 PM

## 2013-12-05 LAB — CBC
HCT: 24.2 % — ABNORMAL LOW (ref 36.0–46.0)
Hemoglobin: 7.8 g/dL — ABNORMAL LOW (ref 12.0–15.0)
MCH: 28.4 pg (ref 26.0–34.0)
MCHC: 32.2 g/dL (ref 30.0–36.0)
MCV: 88 fL (ref 78.0–100.0)
Platelets: 155 10*3/uL (ref 150–400)
RBC: 2.75 MIL/uL — ABNORMAL LOW (ref 3.87–5.11)
RDW: 14.9 % (ref 11.5–15.5)
WBC: 10.2 10*3/uL (ref 4.0–10.5)

## 2013-12-05 LAB — GLUCOSE, CAPILLARY
GLUCOSE-CAPILLARY: 107 mg/dL — AB (ref 70–99)
GLUCOSE-CAPILLARY: 121 mg/dL — AB (ref 70–99)
Glucose-Capillary: 112 mg/dL — ABNORMAL HIGH (ref 70–99)

## 2013-12-05 LAB — BASIC METABOLIC PANEL
BUN: 33 mg/dL — ABNORMAL HIGH (ref 6–23)
CO2: 19 mEq/L (ref 19–32)
Calcium: 9.3 mg/dL (ref 8.4–10.5)
Chloride: 100 mEq/L (ref 96–112)
Creatinine, Ser: 1.84 mg/dL — ABNORMAL HIGH (ref 0.50–1.10)
GFR calc Af Amer: 30 mL/min — ABNORMAL LOW (ref >90)
GFR calc non Af Amer: 26 mL/min — ABNORMAL LOW (ref >90)
Glucose, Bld: 120 mg/dL — ABNORMAL HIGH (ref 70–99)
Potassium: 4.6 mEq/L (ref 3.7–5.3)
Sodium: 133 mEq/L — ABNORMAL LOW (ref 137–147)

## 2013-12-05 MED ORDER — ASPIRIN 325 MG PO TABS
325.0000 mg | ORAL_TABLET | Freq: Two times a day (BID) | ORAL | Status: DC
Start: 2013-12-05 — End: 2013-12-06
  Administered 2013-12-05 – 2013-12-06 (×2): 325 mg via ORAL
  Filled 2013-12-05 (×4): qty 1

## 2013-12-05 NOTE — Progress Notes (Signed)
Physical Therapy Treatment Note   12/05/13 1559  PT Visit Information  Last PT Received On 12/05/13  Assistance Needed +1  PT Time Calculation  PT Start Time 1332  PT Stop Time 1358  PT Time Calculation (min) 26 min  Subjective Data  Subjective Pt more awake/alert this afternoon.  Pt ambulated in hallway, requires increased time and cues to avoid obstacles.  Precautions  Precautions Fall  Cognition  Arousal/Alertness Awake/alert  Overall Cognitive Status Impaired/Different from baseline  Area of Impairment Following commands;Safety/judgement;Awareness  Following Commands Follows one step commands inconsistently  Safety/Judgement Decreased awareness of safety;Decreased awareness of deficits  Problem Solving Slow processing;Decreased initiation;Difficulty sequencing;Requires verbal cues;Requires tactile cues  Bed Mobility  Bed Mobility Supine to Sit;Sit to Supine  Supine to Sit 4: Min assist;With rails  Sit to Supine 3: Mod assist  Details for Bed Mobility Assistance assist for L LE off bed and Bil LEs onto bed, required use of rails   Transfers  Transfers Sit to Stand;Stand to Sit  Sit to Stand 4: Min assist;With upper extremity assist;From bed  Stand to Sit 4: Min guard;With upper extremity assist;To bed  Details for Transfer Assistance verbal cues for safe technique  Ambulation/Gait  Ambulation/Gait Assistance 4: Min guard  Ambulation Distance (Feet) 40 Feet  Assistive device Rolling walker  Ambulation/Gait Assistance Details increased verbal cues for sequence and use of RW, occasional manual assist for RW to negotiate objects, very slow pace requiring increased time  Gait Pattern Decreased step length - left;Decreased hip/knee flexion - left;Step-to pattern;Antalgic  Gait velocity decreased  PT - End of Session  Activity Tolerance Patient limited by fatigue  Patient left in bed;with call bell/phone within reach  PT - Assessment/Plan  PT Plan Current plan remains  appropriate  PT Frequency 7X/week  Follow Up Recommendations Home health PT;Supervision for mobility/OOB  PT equipment None recommended by PT  PT Goal Progression  Progress towards PT goals Progressing toward goals  PT General Charges  $$ ACUTE PT VISIT 1 Procedure  PT Treatments  $Gait Training 23-37 mins  RN in with pain meds beginning of session.  Zenovia JarredKati Noel Henandez, PT, DPT 12/05/2013 Pager: [phone]

## 2013-12-05 NOTE — Progress Notes (Signed)
Patient assisted to bathroom and unable to void. Bladder scan performed for 185 cc's. Will reassess patient at 0030, for any urine output.

## 2013-12-05 NOTE — Progress Notes (Signed)
Found pt naked in doorway of room holding Foley catheter stated she needed to "get out of here". Back to bed, oriented x 3. Bed alarm on. Will monitor closely

## 2013-12-05 NOTE — Progress Notes (Signed)
Patient unable to void since being catherized around 0115. Bladder scan performed which demonstrated 300 cc's urine in bladder. Will inform oncoming RN to monitor patients urine output on day shift.

## 2013-12-05 NOTE — Progress Notes (Signed)
   Subjective: 2 Days Post-Op Procedure(s) (LRB): LEFT TOTAL HIP ARTHROPLASTY ANTERIOR APPROACH (Left)   Patient reports pain as mild, pain controlled. Issues with urinary retention. Last scan revealed 500 cc, order was given to place foley cath. Worked very slow with PT yesterday. No other events throughout the night.   Objective:   VITALS:   Filed Vitals:   12/05/13 0558  BP: 109/67  Pulse: 59  Temp: 98.3 F (36.8 C)  Resp: 16    Neurovascular intact Dorsiflexion/Plantar flexion intact Incision: dressing C/D/I No cellulitis present Compartment soft  LABS  Recent Labs  12/04/13 0711 12/05/13 0511  HGB 8.2* 7.8*  HCT 25.9* 24.2*  WBC 7.3 10.2  PLT 196 155     Recent Labs  12/04/13 0530 12/05/13 0511  NA 138 133*  K 5.6* 4.6  BUN 32* 33*  CREATININE 1.49* 1.84*  GLUCOSE 117* 120*     Assessment/Plan: 2 Days Post-Op Procedure(s) (LRB): LEFT TOTAL HIP ARTHROPLASTY ANTERIOR APPROACH (Left) Advance diet Up with therapy D/C IV fluids Discharge home with home health eventually, when ready Foley cath placed  Expected ABLA  Treated with iron and will observe   Obese (BMI 30-39.9)  Estimated body mass index is 33.65 kg/(m^2) as calculated from the following:      Height as of this encounter: 5\' 2"  (1.575 m).      Weight as of this encounter: 83.462 kg (184 lb).  Patient also counseled that weight may inhibit the healing process  Patient counseled that losing weight will help with future health issues        Anastasio AuerbachMatthew S. Deepika Decatur   PAC  12/05/2013, 9:14 AM

## 2013-12-05 NOTE — Progress Notes (Signed)
Physical Therapy Treatment Patient Details Name: [name] MRN: [id number] DOB: [birth year] Today's Date: 12/05/2013 Time: [id number] PT Time Calculation (min): 11 min  PT Assessment / Plan / Recommendation  History of Present Illness     PT Comments   Pt reports being very tired today.  Increased lethargy and pt unable to keep eyes open during exercises so did not attempt OOB.   Follow Up Recommendations  Home health PT;Supervision for mobility/OOB     Does the patient have the potential to tolerate intense rehabilitation     Barriers to Discharge        Equipment Recommendations  None recommended by PT    Recommendations for Other Services    Frequency 7X/week   Progress towards PT Goals Progress towards PT goals: Not progressing toward goals - comment (very lethargic)  Plan Current plan remains appropriate    Precautions / Restrictions Precautions Precautions: None Restrictions Other Position/Activity Restrictions: WBAT   Pertinent Vitals/Pain Pt c/o pain in L hip with movement however not rated, attempted to keep exercises within pain tolerance however pt falling asleep during exercises   Mobility       Exercises Total Joint Exercises Ankle Circles/Pumps: AROM;10 reps;Both Quad Sets: AROM;10 reps;Both Towel Squeeze: AROM;Both;5 reps Short Arc Quad: AROM;Left;10 reps Heel Slides: Left;15 reps;AAROM Hip ABduction/ADduction: AAROM;Left;15 reps   PT Diagnosis:    PT Problem List:   PT Treatment Interventions:     PT Goals (current goals can now be found in the care plan section)    Visit Information  Last PT Received On: 12/05/13 Assistance Needed: +1    Subjective Data      Cognition  Cognition Arousal/Alertness: Lethargic Overall Cognitive Status: Impaired/Different from baseline Area of Impairment: Following commands Following Commands: Follows one step commands inconsistently General Comments: pt with difficulty following multimodal cues for  exercises in supine, also more lethargic today    Balance     End of Session PT - End of Session Activity Tolerance: Patient limited by lethargy Patient left: in bed;with call bell/phone within reach   GP     [facility]venthealth Fish MemorialEMYRE,[name] 12/05/2013, 12:58 PM Zenovia JarredKati Geneveive Furness, PT, DPT 12/05/2013 Pager: [phone]

## 2013-12-05 NOTE — Progress Notes (Signed)
Pt again assisted to bathroom and unable to void. Bladder scan performed again and it demonstrated 405 cc's in her bladder. At this time patient was I & O catherized for 300 cc's yellow urine. Patient tolerated well.

## 2013-12-05 NOTE — Progress Notes (Signed)
Occupational Therapy Evaluation Patient Details Name: [name] MRN: [id number] DOB: December 24, 1940 Today's Date: 12/05/2013 Time: 4098-1191 OT Time Calculation (min): 40 min  OT Assessment / Plan / Recommendation History of present illness L hip THA   Clinical Impression   PTA, pt lived with family and required occasional assistance with self care. Pt has PCA 5 days/wk, 2 hrs/ per day. Pt with apparent confusion during assessment. Discussed concerns with daughter who states that this is not her mother's baseline cognitive status. She states that her mother can not tolerate any type of pain med with the exception of tylenol. Nsg informed of conversation. Pt is making very slow progress. Required 13 min to walk to toilet from bed and pt was trying to walk into the shower. Pt is not safe to D/C home and feel that confusion needs to improve before D/C is considered. Daughter states she will have 24/7 care at home. Will continue to follw to increase safety and independence with ADL and mobility for ADL, in addition to family education.    OT Assessment  Patient needs continued OT Services    Follow Up Recommendations  Home health OT    Barriers to Discharge      Equipment Recommendations  None recommended by OT    Recommendations for Other Services    Frequency  Min 2X/week    Precautions / Restrictions Precautions Precautions: Fall Precaution Comments: confused Restrictions Other Position/Activity Restrictions: WBAT   Pertinent Vitals/Pain no apparent distress     ADL  Grooming: Supervision/safety Where Assessed - Grooming: Supported standing Lower Body Bathing: Moderate assistance Where Assessed - Lower Body Bathing: Supported sit to stand Lower Body Dressing: Moderate assistance Where Assessed - Lower Body Dressing: Supported sit to stand Toilet Transfer: Minimal assistance Toilet Transfer Method: Other (comment) (ambulating) Toilet Transfer Equipment: Bedside  commode;Other (comment) (over toilet) Toileting - Clothing Manipulation and Hygiene: Minimal assistance (pt going through actinos but not wiping) Where Assessed - Toileting Clothing Manipulation and Hygiene: Sit to stand from 3-in-1 or toilet Equipment Used: Rolling walker;Gait belt Transfers/Ambulation Related to ADLs: min A with max vc ADL Comments: limited by confusion    OT Diagnosis: Generalized weakness;Cognitive deficits;Acute pain;Altered mental status  OT Problem List: Decreased range of motion;Decreased strength;Decreased activity tolerance;Decreased cognition;Decreased safety awareness;Decreased knowledge of use of DME or AE;Obesity;Pain OT Treatment Interventions: Self-care/ADL training;Therapeutic exercise;Energy conservation;DME and/or AE instruction;Therapeutic activities;Cognitive remediation/compensation;Patient/family education;Balance training   OT Goals(Current goals can be found in the care plan section) Acute Rehab OT Goals Patient Stated Goal: none stated OT Goal Formulation: Patient unable to participate in goal setting Time For Goal Achievement: 12/19/13 Potential to Achieve Goals: Good  Visit Information  Last OT Received On: 12/05/13 Assistance Needed: +1 History of Present Illness: L hip THA       Prior Functioning     Home Living Family/patient expects to be discharged to:: Private residence Living Arrangements: Spouse/significant other;Children Available Help at Discharge: Family;Available 24 hours/day Type of Home: House Home Access: Ramped entrance Home Layout: One level Home Equipment: Walker - 2 wheels;Tub bench;Bedside commode Prior Function Level of Independence: Independent with assistive device(s) Communication Communication: No difficulties         Vision/Perception     Cognition  Cognition Arousal/Alertness: Lethargic Behavior During Therapy: Flat affect Overall Cognitive Status: Impaired/Different from baseline Area of  Impairment: Orientation;Attention;Memory;Following commands;Safety/judgement;Awareness;Problem solving Orientation Level: Disoriented to;Time Current Attention Level: Sustained Memory: Decreased recall of precautions;Decreased short-term memory Following Commands: Follows one step commands inconsistently Safety/Judgement:  Decreased awareness of safety;Decreased awareness of deficits Awareness: Intellectual Problem Solving: Slow processing;Decreased initiation;Difficulty sequencing;Requires verbal cues;Requires tactile cues General Comments: Cognition is not at baseline per daughter  Max A for problem solving and sequencing.    Extremity/Trunk Assessment Upper Extremity Assessment Upper Extremity Assessment: RUE deficits/detail RUE Deficits / Details: generalized weakness. ? RTC impairment Lower Extremity Assessment Lower Extremity Assessment: Defer to PT evaluation Cervical / Trunk Assessment Cervical / Trunk Assessment: Normal     Mobility Bed Mobility Bed Mobility: Supine to Sit;Sit to Supine Supine to Sit: 2: Max assist;HOB elevated Sit to Supine: 2: Max assist;HOB flat Details for Bed Mobility Assistance: assistance to lift both legs and help control trunk Transfers Transfers: Sit to Stand;Stand to Sit Sit to Stand: 4: Min assist;With upper extremity assist;From bed Stand to Sit: 4: Min assist;With upper extremity assist;To chair/3-in-1     Exercise    Balance Balance Balance Assessed:  (minguard)   End of Session OT - End of Session Equipment Utilized During Treatment: Gait belt;Rolling walker Activity Tolerance: Patient tolerated treatment well Patient left: with call bell/phone within reach;in bed;Other (comment) (needed to go to bed for cath) Nurse Communication: Mobility status;Other (comment) (concerns over confusion)  GO     [name],[name] 12/05/2013, 1:21 PM [facility] Taleia Sadowski, OTR/L  [phone] 12/05/2013.

## 2013-12-06 LAB — BASIC METABOLIC PANEL
BUN: 29 mg/dL — AB (ref 6–23)
CO2: 17 mEq/L — ABNORMAL LOW (ref 19–32)
Calcium: 9.8 mg/dL (ref 8.4–10.5)
Chloride: 103 mEq/L (ref 96–112)
Creatinine, Ser: 1.48 mg/dL — ABNORMAL HIGH (ref 0.50–1.10)
GFR, EST AFRICAN AMERICAN: 39 mL/min — AB (ref >90)
GFR, EST NON AFRICAN AMERICAN: 34 mL/min — AB (ref >90)
GLUCOSE: 113 mg/dL — AB (ref 70–99)
POTASSIUM: 4.5 meq/L (ref 3.7–5.3)
Sodium: 136 mEq/L — ABNORMAL LOW (ref 137–147)

## 2013-12-06 LAB — CBC
HEMATOCRIT: 23.9 % — AB (ref 36.0–46.0)
Hemoglobin: 7.9 g/dL — ABNORMAL LOW (ref 12.0–15.0)
MCH: 28.9 pg (ref 26.0–34.0)
MCHC: 33.1 g/dL (ref 30.0–36.0)
MCV: 87.5 fL (ref 78.0–100.0)
PLATELETS: 164 10*3/uL (ref 150–400)
RBC: 2.73 MIL/uL — ABNORMAL LOW (ref 3.87–5.11)
RDW: 14.8 % (ref 11.5–15.5)
WBC: 8.1 10*3/uL (ref 4.0–10.5)

## 2013-12-06 LAB — GLUCOSE, CAPILLARY
GLUCOSE-CAPILLARY: 126 mg/dL — AB (ref 70–99)
Glucose-Capillary: 97 mg/dL (ref 70–99)

## 2013-12-06 MED ORDER — ACETAMINOPHEN 325 MG PO TABS: 325.0000 mg | ORAL_TABLET | Freq: Four times a day (QID) | ORAL | Status: DC | PRN

## 2013-12-06 MED ORDER — POLYETHYLENE GLYCOL 3350 17 G PO PACK: 17.0000 g | PACK | Freq: Two times a day (BID) | ORAL | Status: DC

## 2013-12-06 MED ORDER — TRAMADOL HCL 50 MG PO TABS: 50.0000 mg | ORAL_TABLET | Freq: Four times a day (QID) | ORAL | Status: DC | PRN

## 2013-12-06 MED ORDER — FERROUS SULFATE 325 (65 FE) MG PO TABS: 325.0000 mg | ORAL_TABLET | Freq: Three times a day (TID) | ORAL | Status: DC

## 2013-12-06 MED ORDER — ASPIRIN 325 MG PO TABS: 325.0000 mg | ORAL_TABLET | Freq: Two times a day (BID) | ORAL | Status: AC

## 2013-12-06 MED ORDER — DSS 100 MG PO CAPS: 100.0000 mg | ORAL_CAPSULE | Freq: Two times a day (BID) | ORAL | Status: DC

## 2013-12-06 NOTE — Progress Notes (Signed)
Physical Therapy Treatment Patient Details Name: [name] MRN: [id number] DOB: 1941/11/19 Today's Date: 12/06/2013 Time: [id number] PT Time Calculation (min): 26 min  PT Assessment / Plan / Recommendation  History of Present Illness     PT Comments   Pt slowly progressing.  Pt ambulated in hallway with increased time and requires cues for safety.  Continue to recommend supervision for mobility/OOB upon d/c for pt safety.   Follow Up Recommendations  Home health PT;Supervision for mobility/OOB     Does the patient have the potential to tolerate intense rehabilitation     Barriers to Discharge        Equipment Recommendations  None recommended by PT    Recommendations for Other Services    Frequency 7X/week   Progress towards PT Goals Progress towards PT goals: Progressing toward goals  Plan Current plan remains appropriate    Precautions / Restrictions Precautions Precautions: Fall Restrictions Other Position/Activity Restrictions: WBAT   Pertinent Vitals/Pain Pt reports minimal L hip pain with mobility, decreases with rest, repositioned    Mobility  Bed Mobility Bed Mobility: Supine to Sit Supine to Sit: 4: Min assist;With rails Details for Bed Mobility Assistance: assist for L LE off bed  Transfers Transfers: Sit to Stand;Stand to Sit Sit to Stand: With upper extremity assist;From bed;4: Min guard Stand to Sit: 4: Min guard;With upper extremity assist;To chair/3-in-1 Details for Transfer Assistance: verbal cues for safe technique Ambulation/Gait Ambulation/Gait Assistance: 4: Min guard Ambulation Distance (Feet): 80 Feet Assistive device: Rolling walker Ambulation/Gait Assistance Details: pt requires verbal and visual cues for sequence and safety of RW esp negotiating objects however did better today compared to yesterday Gait Pattern: Decreased step length - left;Decreased hip/knee flexion - left;Step-to pattern;Antalgic Gait velocity: decreased General Gait  Details: continues to require increased time    Exercises     PT Diagnosis:    PT Problem List:   PT Treatment Interventions:     PT Goals (current goals can now be found in the care plan section)    Visit Information  Last PT Received On: 12/06/13 Assistance Needed: +1    Subjective Data      Cognition  Cognition Arousal/Alertness: Awake/alert Overall Cognitive Status: Impaired/Different from baseline Area of Impairment: Following commands;Safety/judgement;Awareness Following Commands: Follows one step commands inconsistently Safety/Judgement: Decreased awareness of safety;Decreased awareness of deficits Problem Solving: Slow processing;Decreased initiation;Difficulty sequencing;Requires verbal cues;Requires tactile cues    Balance     End of Session PT - End of Session Activity Tolerance: Patient limited by fatigue Patient left: with call bell/phone within reach;in chair   GP     [name],[name] 12/06/2013, 12:32 PM [name], PT, DPT 12/06/2013 Pager: [phone]

## 2013-12-06 NOTE — Progress Notes (Signed)
   Subjective: 3 Days Post-Op Procedure(s) (LRB): LEFT TOTAL HIP ARTHROPLASTY ANTERIOR APPROACH (Left)   Patient reports pain as mild, pain controlled. Had some confusion throughout the night, but she and her daughter feel that she is doing much better this morning. They both have told me separately that she will get that way when taking higher dosed narcotics.  We have all had a discussion about how to proceed with medications and they feels good. She is ready to be discharged home if she does well with PT and confusion stays resolved.  Objective:   VITALS:   Filed Vitals:   12/06/13 0555  BP: 133/74  Pulse: 62  Temp: 97.8 F (36.6 C)  Resp: 18    Neurovascular intact Dorsiflexion/Plantar flexion intact Incision: dressing C/D/I No cellulitis present Compartment soft  LABS  Recent Labs  12/04/13 0711 12/05/13 0511  HGB 8.2* 7.8*  HCT 25.9* 24.2*  WBC 7.3 10.2  PLT 196 155     Recent Labs  12/04/13 0530 12/05/13 0511 12/06/13 0515  NA 138 133* 136*  K 5.6* 4.6 4.5  BUN 32* 33* 29*  CREATININE 1.49* 1.84* 1.48*  GLUCOSE 117* 120* 113*     Assessment/Plan: 3 Days Post-Op Procedure(s) (LRB): LEFT TOTAL HIP ARTHROPLASTY ANTERIOR APPROACH (Left) Up with therapy Discharge home with home health [facility]. Follow up with OLIN,Shaday Rayborn D in 2 weeks.  Contact information:  [facility] [address] [place] [id number] [phone]    Expected ABLA  Treated with iron and will observe   Obese (BMI 30-39.9)  Estimated body mass index is 33.65 kg/(m^2) as calculated from the following:      Height as of this encounter: 5\' 2"  (1.575 m).      Weight as of this encounter: 83.462 kg (184 lb).  Patient also counseled that weight may inhibit the healing process  Patient counseled that losing weight will help with future health issues       Anastasio AuerbachMatthew S. Makaela Cando   PAC  12/06/2013,  8:26 AM

## 2013-12-06 NOTE — Progress Notes (Signed)
Physical Therapy Treatment Note   12/06/13 1530  PT Visit Information  Last PT Received On 12/06/13  Assistance Needed +1  PT Time Calculation  PT Start Time 1358  PT Stop Time 1429  PT Time Calculation (min) 31 min  Subjective Data  Subjective Pt ambulated again in hallway and performed exercises.  Pt ready for d/c home.  Continue to recommend supervision for safety.  Precautions  Precautions Fall  Restrictions  Other Position/Activity Restrictions WBAT  Cognition  Arousal/Alertness Awake/alert  Overall Cognitive Status Impaired/Different from baseline  Area of Impairment Following commands;Safety/judgement;Awareness  Following Commands Follows one step commands inconsistently  Safety/Judgement Decreased awareness of safety;Decreased awareness of deficits  Problem Solving Slow processing;Decreased initiation;Difficulty sequencing;Requires verbal cues;Requires tactile cues  Transfers  Transfers Sit to Stand;Stand to Sit  Sit to Stand With upper extremity assist;4: Min guard;From chair/3-in-1  Stand to Sit 4: Min guard;With upper extremity assist;To chair/3-in-1  Details for Transfer Assistance verbal cues for safe technique  Ambulation/Gait  Ambulation/Gait Assistance 4: Min guard  Ambulation Distance (Feet) 40 Feet  Assistive device Rolling walker  Ambulation/Gait Assistance Details pt requires verbal and visual cues for sequence and safety of RW, pt better with avoiding obstacles without cues this afternoon  Gait Pattern Decreased step length - left;Decreased hip/knee flexion - left;Step-to pattern;Antalgic  Gait velocity decreased  General Gait Details continues to require increased time  Exercises  Exercises Total Joint  Total Joint Exercises  Ankle Circles/Pumps AROM;10 reps;Both  Quad Sets AROM;10 reps;Both  Gluteal Sets AROM;Both;10 reps  Towel Squeeze AROM;Both;10 reps  Short Arc Quad AROM;Left;10 reps  Heel Slides Left;15 reps;AAROM  Hip ABduction/ADduction  AAROM;Left;15 reps  PT - End of Session  Activity Tolerance Patient limited by fatigue  Patient left with call bell/phone within reach;in chair  PT - Assessment/Plan  PT Plan Current plan remains appropriate  PT Frequency 7X/week  Follow Up Recommendations Home health PT;Supervision for mobility/OOB  PT equipment None recommended by PT  PT Goal Progression  Progress towards PT goals Progressing toward goals  PT General Charges  $$ ACUTE PT VISIT 1 Procedure  PT Treatments  $Gait Training 8-22 mins  $Therapeutic Exercise 8-22 mins   Zenovia JarredKati Hisako Bugh, PT, DPT 12/06/2013 Pager: [phone]

## 2013-12-09 NOTE — Discharge Summary (Signed)
Physician Discharge Summary  Patient ID: [name] MRN: [id number] DOB/AGE: 1941/05/15 73 y.o.  Admit date: 12/03/2013 Discharge date: 12/06/2013   Procedures:  Procedure(s) (LRB): LEFT TOTAL HIP ARTHROPLASTY ANTERIOR APPROACH (Left)  Attending Physician:  Dr. Durene RomansMatthew Olin   Admission Diagnoses:   Left hip OA / pain  Discharge Diagnoses:  Principal Problem:   S/P left THA, AA Active Problems:   Expected blood loss anemia   Obese  Past Medical History  Diagnosis Date  . Hypertension   . CAD (coronary artery disease)     multivessel, CABG 6/10.  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  Myoview (11/20/11) showed no ischemia & EF 62%  . Hyperlipidemia   . Deep vein thrombosis, upper right extremity     post-PPM  . Obesity   . Dehiscence of closure of sternum or sternotomy 07/2009    sternal infection; required flap closure  . COPD (chronic obstructive pulmonary disease)     severe lung disease by PFTs 6/12  . Seizure disorder   . 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 PUD (peptic ulcer disease)   . Pacemaker   . Sleep apnea     denies CPAP use on 09/30/2013  . Diabetes mellitus, type II   . H/O hiatal hernia   . Migraines     "years ago" (09/30/2013)  . DDD (degenerative disc disease)   . Arthritis     "legs and back" (09/30/2013)  . Chronic lower back pain   . Urge incontinence of urine   . Myocardial infarction     years ago  . Anginal pain     "all the time, but not heart related"  . Asthma     years ago  . Orthopnea     sometimes  . 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 History of kidney stones   . Gastroesophageal reflux disease     not much  . Seizures     "long time ago; don't remember what they were related to" (09/30/2013)none recent  . Anemia   . Blurred vision, bilateral     sometimes double vision    HPI: [name], 73 y.o. female, has a history of pain and functional disability in the left  hip(s) due to arthritis and patient has failed non-surgical conservative treatments for greater than 12 weeks to include NSAID's and/or analgesics, corticosteriod injections, use of assistive devices and activity modification. Onset of symptoms was gradual starting years ago with gradually worsening course since that time.The patient noted no past surgery on the left hip(s). Patient currently rates pain in the left hip at 10 out of 10 with activity. Patient has night pain, worsening of pain with activity and weight bearing, trendelenberg gait, pain that interfers with activities of daily living and pain with passive range of motion. Patient has evidence of periarticular osteophytes and joint space narrowing by imaging studies. This condition presents safety issues increasing the risk of falls. There is no current active infection. Risks, benefits and expectations were discussed with the patient. Risks including but not limited to the risk of anesthesia, blood clots, nerve damage, blood vessel damage, failure of the prosthesis, infection and up to and including death. Patient understand the risks, benefits and expectations and wishes to proceed with surgery.   PCP: Fredirick MaudlinHAWKINS,EDWARD L, MD   Discharged Condition: good  Hospital Course:  Patient underwent the above stated procedure on  12/03/2013. Patient tolerated the procedure well and brought to the recovery room in good condition and subsequently to the floor.  POD #1 BP: 104/68 ; Pulse: 62 ; Temp: 98 F (36.7 C) ; Resp: 14  Patient reports pain as mild, pain controlled. No events throughout the night.  Neurovascular intact, dorsiflexion/plantar flexion intact, incision: dressing C/D/I, no cellulitis present and compartment soft.   LABS  Basename    HGB  8.2  HCT  25.9   POD #2  BP: 109/67 ; Pulse: 59 ; Temp: 98.3 F (36.8 C) ; Resp: 16  Patient reports pain as mild, pain controlled. Issues with urinary retention. Last scan revealed 500 cc, order  was given to place foley cath. Worked very slow with PT yesterday. No other events throughout the night.  Neurovascular intact, dorsiflexion/plantar flexion intact, incision: dressing C/D/I, no cellulitis present and compartment soft.   LABS  Basename    HGB  7.8  HCT  24.2   POD #3  BP: 133/74 ; Pulse: 62 ; Temp: 97.8 F (36.6 C) ; Resp: 18  Patient reports pain as mild, pain controlled. Had some confusion throughout the night, but she and her daughter feel that she is doing much better this morning. They both have told me separately that she will get that way when taking higher dosed narcotics. We have all had a discussion about how to proceed with medications and they feel good. Denies any blood loss anemia symptoms and kidney function getting better.  She is ready to be discharged home. Neurovascular intact, dorsiflexion/plantar flexion intact, incision: dressing C/D/I, no cellulitis present and compartment soft.   LABS  Basename    HGB  7.9  HCT  23.9    Discharge Exam: General appearance: alert, cooperative and no distress Extremities: Homans sign is negative, no sign of DVT, no edema, redness or tenderness in the calves or thighs and no ulcers, gangrene or trophic changes  Disposition:     Home-Health Care Svc with follow up in 2 weeks   Follow-up Information   Follow up with Shelda Pal, MD. Schedule an appointment as soon as possible for a visit in 2 weeks.   Specialty:  Orthopedic Surgery   Contact information:   [address] [place] [address] [phone]       Discharge Orders   Future Appointments Provider Department Dept Phone   12/24/2013 10:40 AM Antoine Poche, MD [facility] [place] [phone]   Future Orders Complete By Expires   Call MD / Call 911  As directed    Comments:     If you experience chest pain or shortness of breath, CALL 911 and be transported to the hospital emergency room.  If you develope a fever above  101 F, pus (white drainage) or increased drainage or redness at the wound, or calf pain, call your surgeon's office.   Change dressing  As directed    Comments:     Maintain surgical dressing for 2-3 days, then replace daily with 4x4 guaze and tape.  Keep the area dry and clean.   Constipation Prevention  As directed    Comments:     Drink plenty of fluids.  Prune juice may be helpful.  You may use a stool softener, such as Colace (over the counter) 100 mg twice a day.  Use MiraLax (over the counter) for constipation as needed.   Diet - low sodium heart healthy  As directed    Discharge instructions  As directed    Comments:     Maintain surgical dressing for 2-3 days, then replace daily with 4x4 guaze and tape.  Keep the area dry and clean. Follow up in 2 weeks at [facility]. Call with any questions or concerns.   Driving restrictions  As directed    Comments:     No driving for 4 weeks   Increase activity slowly as tolerated  As directed    TED hose  As directed    Comments:     Use stockings (TED hose) for 2 weeks on both leg(s).  You may remove them at night for sleeping.   Weight bearing as tolerated  As directed    Questions:     Laterality:     Extremity:          Medication List    STOP taking these medications       aspirin EC 81 MG tablet  Replaced by:  aspirin 325 MG tablet      TAKE these medications       acetaminophen 325 MG tablet  Commonly known as:  TYLENOL  Take 1-2 tablets (325-650 mg total) by mouth every 6 (six) hours as needed.     aspirin 325 MG tablet  Take 1 tablet (325 mg total) by mouth 2 (two) times daily.     benazepril 40 MG tablet  Commonly known as:  LOTENSIN  Take 40 mg by mouth every morning.     DSS 100 MG Caps  Take 100 mg by mouth 2 (two) times daily.     ferrous sulfate 325 (65 FE) MG tablet  Take 1 tablet (325 mg total) by mouth 3 (three) times daily after meals.     furosemide 40 MG tablet  Commonly known as:   LASIX  Take 40 mg by mouth every morning.     ketorolac 0.5 % ophthalmic solution  Commonly known as:  ACULAR  Place 1 drop into the right eye 3 (three) times daily.     levETIRAcetam 500 MG tablet  Commonly known as:  KEPPRA  Take 500 mg by mouth 2 (two) times daily.     metFORMIN 500 MG tablet  Commonly known as:  GLUCOPHAGE  Take 500 mg by mouth 2 (two) times daily with a meal.     metoprolol tartrate 25 MG tablet  Commonly known as:  LOPRESSOR  Take 25 mg by mouth 2 (two) times daily.     nitroGLYCERIN 0.4 MG SL tablet  Commonly known as:  NITROSTAT  Place 1 tablet (0.4 mg total) under the tongue every 5 (five) minutes as needed for chest pain.     omega-3 acid ethyl esters 1 G capsule  Commonly known as:  LOVAZA  Take 1 g by mouth daily.     oxybutynin 10 MG 24 hr tablet  Commonly known as:  DITROPAN-XL  Take 10 mg by mouth every morning.     pantoprazole 40 MG tablet  Commonly known as:  PROTONIX  Take 1 tablet (40 mg total) by mouth daily.     polyethylene glycol packet  Commonly known as:  MIRALAX / GLYCOLAX  Take 17 g by mouth 2 (two) times daily.     potassium chloride 10 MEQ tablet  Commonly known as:  K-DUR,KLOR-CON  Take 10 mEq by mouth daily.     traMADol 50 MG tablet  Commonly known as:  ULTRAM  Take 1-2 tablets (50-100 mg total) by mouth every 6 (  six) hours as needed for moderate pain.         Signed: Anastasio Auerbach. Kimora Stankovic   PAC  12/09/2013, 1:22 PM

## 2013-12-24 ENCOUNTER — Encounter: Payer: PRIVATE HEALTH INSURANCE | Admitting: Cardiology

## 2013-12-27 ENCOUNTER — Encounter: Payer: Self-pay | Admitting: Cardiology

## 2013-12-27 ENCOUNTER — Ambulatory Visit (INDEPENDENT_AMBULATORY_CARE_PROVIDER_SITE_OTHER): Payer: PRIVATE HEALTH INSURANCE | Admitting: Cardiology

## 2013-12-27 VITALS — BP 144/78 | HR 84 | Ht 62.0 in | Wt 186.0 lb

## 2013-12-27 DIAGNOSIS — I442 Atrioventricular block, complete: Secondary | ICD-10-CM

## 2013-12-27 DIAGNOSIS — I5032 Chronic diastolic (congestive) heart failure: Secondary | ICD-10-CM

## 2013-12-27 DIAGNOSIS — I10 Essential (primary) hypertension: Secondary | ICD-10-CM

## 2013-12-27 DIAGNOSIS — I2510 Atherosclerotic heart disease of native coronary artery without angina pectoris: Secondary | ICD-10-CM

## 2013-12-27 NOTE — Progress Notes (Signed)
Clinical Summary Ms. Biancardi is a 73 y.o.female I am seeing for the first time today, she was seen for the following medical problems.   1.CAD - prior CABG in 05/2009, recent myoview 11/2011 without evidence of ischemia - LVEF 60% on recent echo 06/2013 - denies any chest pain, does have chronic SOB from COPD which is stable - compliant with meds, she is not on a statin for unclear reasons.   2. Heart block - s/p PPM, followed by EP Dr Ladona Ridgel - normal function on recent check 10/2013  3. Hip pain - recent hip replacment that was uncomplicated  4. Chronic diastolic heart failure - no orthopnea, + LE edema. - compliant with medications. Limiting sodium intake, avoiding NSAIDs - last labs 12/2013: Cr 1.48 GFR 34 K 4.5  5. HTN - checks bp daily, typically 170/80s at home per her report.  - compliant with home medications Past Medical History  Diagnosis Date  . Hypertension   . CAD (coronary artery disease)     multivessel, CABG 6/10.  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  Myoview (11/20/11) showed no ischemia & EF 62%  . Hyperlipidemia   . Deep vein thrombosis, upper right extremity     post-PPM  . Obesity   . Dehiscence of closure of sternum or sternotomy 07/2009    sternal infection; required flap closure  . COPD (chronic obstructive pulmonary disease)     severe lung disease by PFTs 6/12  . Seizure disorder   . 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 PUD (peptic ulcer disease)   . Pacemaker   . Sleep apnea     denies CPAP use on 09/30/2013  . Diabetes mellitus, type II   . H/O hiatal hernia   . Migraines     "years ago" (09/30/2013)  . DDD (degenerative disc disease)   . Arthritis     "legs and back" (09/30/2013)  . Chronic lower back pain   . Urge incontinence of urine   . Myocardial infarction     years ago  . Anginal pain     "all the time, but not heart related"  . Asthma     years ago  . Orthopnea     sometimes  . Stroke      years ago, "residual on left back"  . History of kidney stones   . Gastroesophageal reflux disease     not much  . Seizures     "long time ago; don't remember what they were related to" (09/30/2013)none recent  . Anemia   . Blurred vision, bilateral     sometimes double vision     Allergies  Allergen Reactions  . Codeine     "good" headache  . Other     Leafy raw vegetables and seeds  . Oxycodone Hcl     "good" headache; also OxyContin  . Reglan [Metoclopramide]     CONFUSION     Current Outpatient Prescriptions  Medication Sig Dispense Refill  . acetaminophen (TYLENOL) 325 MG tablet Take 1-2 tablets (325-650 mg total) by mouth every 6 (six) hours as needed.      Marland Kitchen aspirin 325 MG tablet Take 1 tablet (325 mg total) by mouth 2 (two) times daily.  60 tablet  0  . benazepril (LOTENSIN) 40 MG tablet Take 40 mg by mouth every morning.      . docusate sodium 100 MG CAPS Take 100 mg by mouth  2 (two) times daily.  10 capsule  0  . ferrous sulfate 325 (65 FE) MG tablet Take 1 tablet (325 mg total) by mouth 3 (three) times daily after meals.    3  . furosemide (LASIX) 40 MG tablet Take 40 mg by mouth every morning.      Marland Kitchen. ketorolac (ACULAR) 0.5 % ophthalmic solution Place 1 drop into the right eye 3 (three) times daily.      Marland Kitchen. levETIRAcetam (KEPPRA) 500 MG tablet Take 500 mg by mouth 2 (two) times daily.      . metFORMIN (GLUCOPHAGE) 500 MG tablet Take 500 mg by mouth 2 (two) times daily with a meal.       . metoprolol tartrate (LOPRESSOR) 25 MG tablet Take 25 mg by mouth 2 (two) times daily.       . nitroGLYCERIN (NITROSTAT) 0.4 MG SL tablet Place 1 tablet (0.4 mg total) under the tongue every 5 (five) minutes as needed for chest pain.  25 tablet  4  . omega-3 acid ethyl esters (LOVAZA) 1 G capsule Take 1 g by mouth daily.      Marland Kitchen. oxybutynin (DITROPAN-XL) 10 MG 24 hr tablet Take 10 mg by mouth every morning.       . pantoprazole (PROTONIX) 40 MG tablet Take 1 tablet (40 mg total) by  mouth daily.  30 tablet  3  . polyethylene glycol (MIRALAX / GLYCOLAX) packet Take 17 g by mouth 2 (two) times daily.  14 each  0  . potassium chloride (K-DUR,KLOR-CON) 10 MEQ tablet Take 10 mEq by mouth daily.      . traMADol (ULTRAM) 50 MG tablet Take 1-2 tablets (50-100 mg total) by mouth every 6 (six) hours as needed for moderate pain.  80 tablet  0   No current facility-administered medications for this visit.     Past Surgical History  Procedure Laterality Date  . Pacemaker placement  1993    Status post DDD pacemaker implantation in 1993, with Medtronic Kappa generator change in 2003 for  second-degree AV block, most recent gen change by Fawn KirkJA 04/14/11  . Total hip arthroplasty Right   . Replacement total knee Bilateral   . Thyroidectomy    . Wrist surgery Left     "tumor taken off" (09/30/2013)  . Coronary artery bypass graft  05/1999    - LIMA to LAD, SVG to OM, SVG to PDA  . Reconstructive repair sternal      for infection s/p CABG 8/10  . [facility]astroduoden[facility]      [facility],  biopsy, and disruption Schatzki's ring  . Colonoscopy  04/27/2005    hemorrhoids, diverticula  . Esophagogastroduodenoscopy  09/10/2012    RMR: Noncritical Schatzki's ring. Diffuse gastric erosions and an  area of partially healing ulceration-status post biopsy/ Small hiatal hernia. Bx reactive gastropathy.  . Colonoscopy with esophagogastroduodenoscopy (egd) N/A 02/20/2013    RMR: pancolonic diverticulosis and internal hemorrhoids. EGD with small hiatal hernia, healed gastric ulcer  . Portacath placement Left 07/17/2013    Procedure: INSERTION PORT-A-CATH-left subclavian;  Surgeon: Fabio BeringBrent C Ziegler, MD;  Location: [facility];  Service: General;  Laterality: Left;  . Appendectomy    . Cholecystectomy    . Insert / replace / remove pacemaker  1993  . Lumbar disc surgery    . Posterior lumbar fusion    . Tubal ligation    . Cesarean section  ~1980  . Abdominal hysterectomy      partial  .  Back surgery      x3 total  . Total hip arthroplasty Left 12/03/2013    Procedure: LEFT TOTAL HIP ARTHROPLASTY ANTERIOR APPROACH;  Surgeon: Shelda Pal, MD;  Location: [facility];  Service: Orthopedics;  Laterality: Left;     Allergies  Allergen Reactions  . Codeine     "good" headache  . Other     Leafy raw vegetables and seeds  . Oxycodone Hcl     "good" headache; also OxyContin  . Reglan [Metoclopramide]     CONFUSION      Family History  Problem Relation Age of Onset  . Diabetes    . Heart disease Mother     MI  . Coronary artery disease      Female <55  . Cancer    . Colon cancer Father     age 68     Social History Ms. Pretty reports that she has never smoked. She has quit using smokeless tobacco. Her smokeless tobacco use included Snuff. Ms. Schwark reports that she does not drink alcohol.   Review of Systems CONSTITUTIONAL: No weight loss, fever, chills, weakness or fatigue.  HEENT: Eyes: No visual loss, blurred vision, double vision or yellow sclerae.No hearing loss, sneezing, congestion, runny nose or sore throat.  SKIN: No rash or itching.  CARDIOVASCULAR: per HPI RESPIRATORY: No shortness of breath, cough or sputum.  GASTROINTESTINAL: No anorexia, nausea, vomiting or diarrhea. No abdominal pain or blood.  GENITOURINARY: No burning on urination, no polyuria NEUROLOGICAL: No headache, dizziness, syncope, paralysis, ataxia, numbness or tingling in the extremities. No change in bowel or bladder control.  MUSCULOSKELETAL: No muscle, back pain, joint pain or stiffness.  LYMPHATICS: No enlarged nodes. No history of splenectomy.  PSYCHIATRIC: No history of depression or anxiety.  ENDOCRINOLOGIC: No reports of sweating, cold or heat intolerance. No polyuria or polydipsia.  Marland Kitchen   Physical Examination Filed Vitals:   12/27/13 0926  BP: 144/78  Pulse: 84   Filed Weights   12/27/13 0926  Weight: 186 lb (84.369 kg)    Gen: resting comfortably, no acute  distress HEENT: no scleral icterus, pupils equal round and reactive, no palptable cervical adenopathy,  CV: RRR, no m/r/g, no JVD, no carotid bruits Resp: Clear to auscultation bilaterally GI: abdomen is soft, non-tender, non-distended, normal bowel sounds, no hepatosplenomegaly MSK: extremities are warm, no edema.  Skin: warm, no rash Neuro:  no focal deficits Psych: appropriate affect   Diagnostic Studies 06/2013 Echo LVEF 60-65%, grade I diastolic dysfunction,     Assessment and Plan   1. CAD - no current symptoms - continue risk factor modification and secondary prevention - she is not on a statin for unclear reasons, will review medical chart. Neither she nor her daughter recall any side effects  2. Heart block - no current symptoms, normal device check Nov 2014 - continue regular follow up with EP  3. Chronic diastolic heart failure - appears euvolemic today - continue bp control, continue current diuretic dosing  4. HTN - at goal given her age based on clinic numbers. Her home numbers are elevated by report however she typically checks her bp prior to taking her meds - patient will keep bp log, asked to check after taking medications - reasonable goal for her given her age is less than 150/90     Antoine Poche, M.D., F.A.C.C.

## 2013-12-27 NOTE — Patient Instructions (Addendum)
Your physician recommends that you schedule a follow-up appointment in: 6 WEEKS  Your physician has requested that you regularly monitor and record your blood pressure readings at home. Please use the same machine at the same time of day to check your readings and record them to bring to your follow-up visit.BRING THE RECORDINGS WITH YOU AT YOUR FOLLOW UP VISIT  Your physician has recommended you make the following change in your medication:   1) DECREASE YOUR ASPRIN 81MG  ONCE DAILY

## 2014-02-03 ENCOUNTER — Other Ambulatory Visit: Payer: Self-pay

## 2014-02-03 MED ORDER — FUROSEMIDE 40 MG PO TABS: 40.0000 mg | ORAL_TABLET | Freq: Every morning | ORAL | Status: DC

## 2014-02-12 ENCOUNTER — Encounter: Payer: Self-pay | Admitting: Cardiology

## 2014-02-12 ENCOUNTER — Ambulatory Visit (INDEPENDENT_AMBULATORY_CARE_PROVIDER_SITE_OTHER): Payer: PRIVATE HEALTH INSURANCE | Admitting: Cardiology

## 2014-02-12 VITALS — BP 143/89 | HR 91 | Ht 62.0 in | Wt 189.0 lb

## 2014-02-12 DIAGNOSIS — I5032 Chronic diastolic (congestive) heart failure: Secondary | ICD-10-CM

## 2014-02-12 DIAGNOSIS — I10 Essential (primary) hypertension: Secondary | ICD-10-CM

## 2014-02-12 DIAGNOSIS — D649 Anemia, unspecified: Secondary | ICD-10-CM

## 2014-02-12 DIAGNOSIS — I442 Atrioventricular block, complete: Secondary | ICD-10-CM

## 2014-02-12 DIAGNOSIS — I2510 Atherosclerotic heart disease of native coronary artery without angina pectoris: Secondary | ICD-10-CM

## 2014-02-12 MED ORDER — ATORVASTATIN CALCIUM 40 MG PO TABS: 40.0000 mg | ORAL_TABLET | Freq: Every day | ORAL | Status: DC

## 2014-02-12 MED ORDER — FUROSEMIDE 20 MG PO TABS: 20.0000 mg | ORAL_TABLET | Freq: Every morning | ORAL | Status: DC

## 2014-02-12 NOTE — Patient Instructions (Signed)
Your physician recommends that you schedule a follow-up appointment in: 4 months woth Dr Lurena JoinerBranch You will receive a reminder letter two months in advance reminding you to call and schedule your appointment. If you don't receive this letter, please contact our office.  Your physician recommends that you return for lab work next week. Fasting Lipid, CBC  Your physician has recommended you make the following change in your medication:  Decreased Lasix to 20 mg daily Start Atorvastatin 40 mg daily.  Your physician has requested that you regularly monitor and record your blood pressure readings at home. Please use the same machine at the same time of day to check your readings and record them to bring to your follow-up visit.

## 2014-02-12 NOTE — Progress Notes (Signed)
Clinical Summary Ms. [name] is a 73 y.o.female seen today for follow up of the following medical problems.  1.CAD  - prior CABG in 05/2009, recent myoview 11/2011 without evidence of ischemia  - LVEF 60% on recent echo 06/2013   - denies any chest pain, does have chronic SOB from COPD which is stable  - compliant with meds, she is not on a statin for unclear reasons.   2. Heart block  - s/p PPM, followed by EP Dr Ladona Ridgelaylor  - normal function on recent check 10/2013  - does report some orthostatic symptoms   3. Chronic diastolic heart failure  - no orthopnea, + LE edema.  - compliant with medications. Limiting sodium intake, avoiding NSAIDs   4. HTN  - checks bp daily, reports often elevated, unsure of exact numbers.   - compliant with home medications - does describe some orthostatic symptoms  Past Medical History  Diagnosis Date  . Hypertension   . CAD (coronary artery disease)     multivessel, CABG 6/10.  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  Myoview (11/20/11) showed no ischemia & EF 62%  . Hyperlipidemia   . Deep vein thrombosis, upper right extremity     post-PPM  . Obesity   . Dehiscence of closure of sternum or sternotomy 07/2009    sternal infection; required flap closure  . COPD (chronic obstructive pulmonary disease)     severe lung disease by PFTs 6/12  . Seizure disorder   . 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 PUD (peptic ulcer disease)   . Pacemaker   . Sleep apnea     denies CPAP use on 09/30/2013  . Diabetes mellitus, type II   . H/O hiatal hernia   . Migraines     "years ago" (09/30/2013)  . DDD (degenerative disc disease)   . Arthritis     "legs and back" (09/30/2013)  . Chronic lower back pain   . Urge incontinence of urine   . Myocardial infarction     years ago  . Anginal pain     "all the time, but not heart related"  . Asthma     years ago  . Orthopnea     sometimes  . Stroke     years ago,  "residual on left back"  . History of kidney stones   . Gastroesophageal reflux disease     not much  . Seizures     "long time ago; don't remember what they were related to" (09/30/2013)none recent  . Anemia   . Blurred vision, bilateral     sometimes double vision     Allergies  Allergen Reactions  . Codeine     "good" headache  . Other     Leafy raw vegetables and seeds  . Oxycodone Hcl     "good" headache; also OxyContin  . Reglan [Metoclopramide]     CONFUSION     Current Outpatient Prescriptions  Medication Sig Dispense Refill  . acetaminophen (TYLENOL) 325 MG tablet Take 1-2 tablets (325-650 mg total) by mouth every 6 (six) hours as needed.      . benazepril (LOTENSIN) 40 MG tablet Take 40 mg by mouth every morning.      . docusate sodium 100 MG CAPS Take 100 mg by mouth 2 (two) times daily.  10 capsule  0  . ferrous sulfate 325 (65 FE) MG tablet Take 1 tablet (325 mg total)  by mouth 3 (three) times daily after meals.    3  . furosemide (LASIX) 40 MG tablet Take 1 tablet (40 mg total) by mouth every morning.  30 tablet  6  . ketorolac (ACULAR) 0.5 % ophthalmic solution Place 1 drop into the right eye 3 (three) times daily.      Marland Kitchen levETIRAcetam (KEPPRA) 500 MG tablet Take 500 mg by mouth 2 (two) times daily.      . metFORMIN (GLUCOPHAGE) 500 MG tablet Take 500 mg by mouth 2 (two) times daily with a meal.       . metoprolol tartrate (LOPRESSOR) 25 MG tablet Take 25 mg by mouth 2 (two) times daily.       . nitroGLYCERIN (NITROSTAT) 0.4 MG SL tablet Place 1 tablet (0.4 mg total) under the tongue every 5 (five) minutes as needed for chest pain.  25 tablet  4  . omega-3 acid ethyl esters (LOVAZA) 1 G capsule Take 1 g by mouth daily.      Marland Kitchen oxybutynin (DITROPAN-XL) 10 MG 24 hr tablet Take 10 mg by mouth every morning.       . pantoprazole (PROTONIX) 40 MG tablet Take 1 tablet (40 mg total) by mouth daily.  30 tablet  3  . polyethylene glycol (MIRALAX / GLYCOLAX) packet Take 17  g by mouth 2 (two) times daily.  14 each  0  . potassium chloride (K-DUR,KLOR-CON) 10 MEQ tablet Take 10 mEq by mouth daily.      . traMADol (ULTRAM) 50 MG tablet Take 1-2 tablets (50-100 mg total) by mouth every 6 (six) hours as needed for moderate pain.  80 tablet  0   No current facility-administered medications for this visit.     Past Surgical History  Procedure Laterality Date  . Pacemaker placement  1993    Status post DDD pacemaker implantation in 1993, with Medtronic Kappa generator change in 2003 for  second-degree AV block, most recent gen change by Fawn Kirk 04/14/11  . Total hip arthroplasty Right   . Replacement total knee Bilateral   . Thyroidectomy    . Wrist surgery Left     "tumor taken off" (09/30/2013)  . Coronary artery bypass graft  05/1999    - LIMA to LAD, SVG to OM, SVG to PDA  . Reconstructive repair sternal      for infection s/p CABG 8/10  . Esophagogastroduodenoscopy      with [facility] dilation,  biopsy, and disruption Schatzki's ring  . Colonoscopy  04/27/2005    hemorrhoids, diverticula  . Esophagogastroduodenoscopy  09/10/2012    RMR: Noncritical Schatzki's ring. Diffuse gastric erosions and an  area of partially healing ulceration-status post biopsy/ Small hiatal hernia. Bx reactive gastropathy.  . Colonoscopy with esophagogastroduodenoscopy (egd) N/A 02/20/2013    RMR: pancolonic diverticulosis and internal hemorrhoids. EGD with small hiatal hernia, healed gastric ulcer  . Portacath placement Left 07/17/2013    Procedure: INSERTION PORT-A-CATH-left subclavian;  Surgeon: Fabio Bering, MD;  Location: [facility];  Service: General;  Laterality: Left;  . Appendectomy    . Cholecystectomy    . Insert / replace / remove pacemaker  1993  . Lumbar disc surgery    . Posterior lumbar fusion    . Tubal ligation    . Cesarean section  ~1980  . Abdominal hysterectomy      partial  . Back surgery      x3 total  . Total hip arthroplasty Left 12/03/2013    Procedure:  LEFT TOTAL HIP ARTHROPLASTY ANTERIOR APPROACH;  Surgeon: Shelda Pal, MD;  Location: [facility];  Service: Orthopedics;  Laterality: Left;     Allergies  Allergen Reactions  . Codeine     "good" headache  . Other     Leafy raw vegetables and seeds  . Oxycodone Hcl     "good" headache; also OxyContin  . Reglan [Metoclopramide]     CONFUSION      Family History  Problem Relation Age of Onset  . Diabetes    . Heart disease Mother     MI  . Coronary artery disease      Female <55  . Cancer    . Colon cancer Father     age 39     Social History [name] reports that she has never smoked. She has quit using smokeless tobacco. Her smokeless tobacco use included Snuff. [name] reports that she does not drink alcohol.   Review of Systems CONSTITUTIONAL: No weight loss, fever, chills, weakness or fatigue.  HEENT: Eyes: No visual loss, blurred vision, double vision or yellow sclerae.No hearing loss, sneezing, congestion, runny nose or sore throat.  SKIN: No rash or itching.  CARDIOVASCULAR: per HPI RESPIRATORY: No shortness of breath, cough or sputum.  GASTROINTESTINAL: No anorexia, nausea, vomiting or diarrhea. No abdominal pain or blood.  GENITOURINARY: No burning on urination, no polyuria NEUROLOGICAL: dizziness with standing MUSCULOSKELETAL: No muscle, back pain, joint pain or stiffness.  LYMPHATICS: No enlarged nodes. No history of splenectomy.  PSYCHIATRIC: No history of depression or anxiety.  ENDOCRINOLOGIC: No reports of sweating, cold or heat intolerance. No polyuria or polydipsia.  Marland Kitchen   Physical Examination p 91 bp 143/89 Wt 189 lbs BMI 35 Gen: resting comfortably, no acute distress HEENT: no scleral icterus, pupils equal round and reactive, no palptable cervical adenopathy,  CV: RRR, no m/r/g, no JVD, no carotid bruits Resp: Clear to auscultation bilaterally GI: abdomen is soft, non-tender, non-distended, normal bowel sounds, no hepatosplenomegaly MSK:  extremities are warm, no edema.  Skin: warm, no rash Neuro:  no focal deficits Psych: appropriate affect   Diagnostic Studies 06/2013 Echo  LVEF 60-65%, grade I diastolic dysfunction,        Assessment and Plan  1. CAD  - no current symptoms  - continue risk factor modification and secondary prevention  - she has not on a statin for unclear reasons. Neither she nor her daughter recall any side effects  - given prior hx of CAD statin is indicted, will start atorva 40mg  daily  2. Heart block  - no current symptoms, normal device check Nov 2014  - continue regular follow up with EP   3. Chronic diastolic heart failure  - appears euvolemic today  - continue bp control - describes orthostatic symptoms, will decrease lasix to 20mg  daily. Instructed if increased swelling or SOB ok to take higher dose  4. HTN  - continue current meds, avoid intensifying in setting of orthostatic dizziness.  - she will bring bp log next visit  5. Anemia - low H&H early Jan around time of surgery, has not had repeat - will order repeat cbc  Follow up 4 months   Antoine Poche, M.D., F.A.C.C.

## 2014-02-19 LAB — CBC
HCT: 28.7 % — ABNORMAL LOW (ref 36.0–46.0)
HEMOGLOBIN: 9.5 g/dL — AB (ref 12.0–15.0)
MCH: 26.8 pg (ref 26.0–34.0)
MCHC: 33.1 g/dL (ref 30.0–36.0)
MCV: 80.8 fL (ref 78.0–100.0)
PLATELETS: 249 10*3/uL (ref 150–400)
RBC: 3.55 MIL/uL — AB (ref 3.87–5.11)
RDW: 14.6 % (ref 11.5–15.5)
WBC: 6.3 10*3/uL (ref 4.0–10.5)

## 2014-02-19 LAB — LIPID PANEL
CHOL/HDL RATIO: 3 ratio
Cholesterol: 123 mg/dL (ref 0–200)
HDL: 41 mg/dL (ref >39)
LDL Cholesterol: 69 mg/dL (ref 0–99)
Triglycerides: 67 mg/dL (ref <150)
VLDL: 13 mg/dL (ref 0–40)

## 2014-02-20 IMAGING — CR DG CHEST 1V PORT
1 series · 1 of 1 positions shown · non-contrast
Comparison: Portable exam 3333 hours compared to 03/23/2012

CLINICAL DATA: Weakness, fever, vomiting, history diabetes,
hypertension, coronary disease, COPD

PORTABLE CHEST - 1 VIEW

[view not recorded]
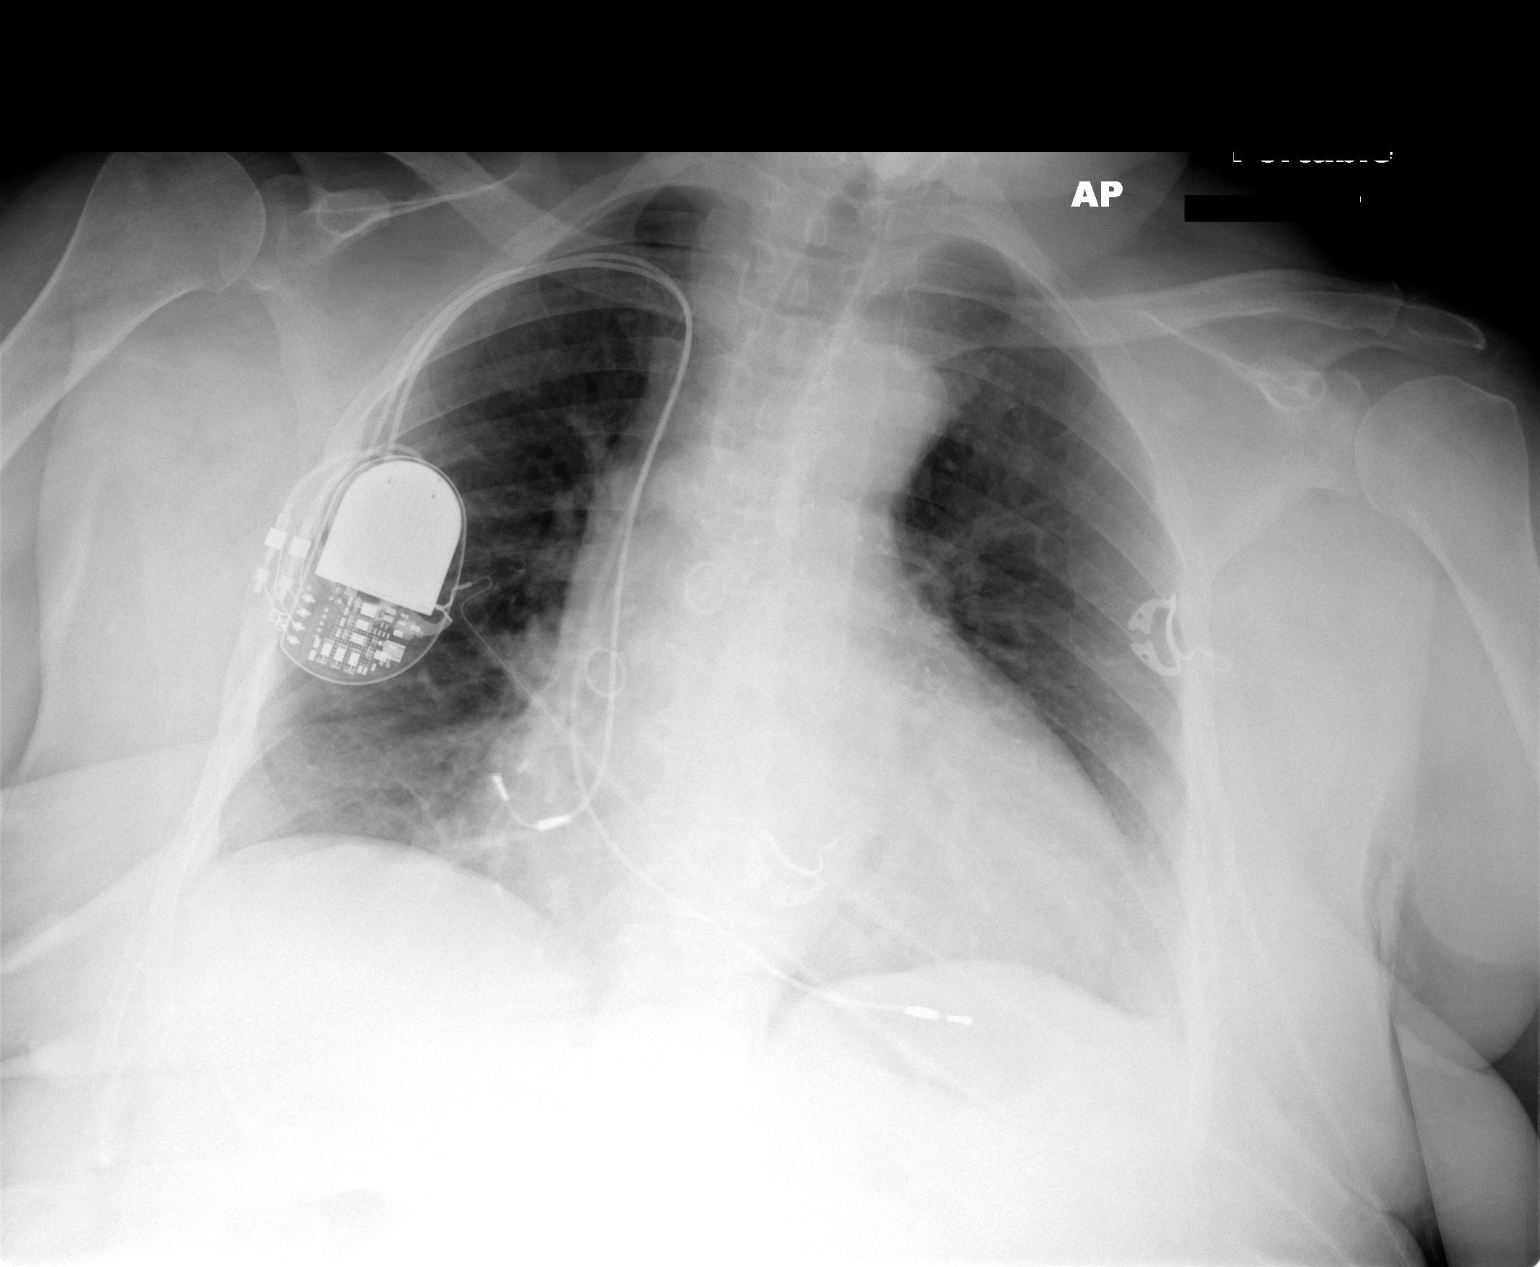

[1 of 1 positions shown; findings below may reference images not displayed]

FINDINGS: Right subclavian sequential transveous pacemaker leads project at
right atrium and right ventricle.
Enlargement of cardiac silhouette post CABG.
Tortuous aorta.
Mediastinal contours and pulmonary vascularity otherwise normal.
Lungs clear.
No pleural effusion or pneumothorax.
IMPRESSION: Enlargement of cardiac silhouette post CABG and pacemaker.
No acute abnormalities.

## 2014-03-06 IMAGING — CR DG ABDOMEN ACUTE W/ 1V CHEST
3 series · 3 of 3 positions shown · non-contrast
Comparison: Chest radiograph 08/24/2012

CLINICAL DATA: Vomiting, diarrhea and pain.

ACUTE ABDOMEN SERIES (ABDOMEN 2 VIEW & CHEST 1 VIEW)

[view not recorded (1 of 3)]
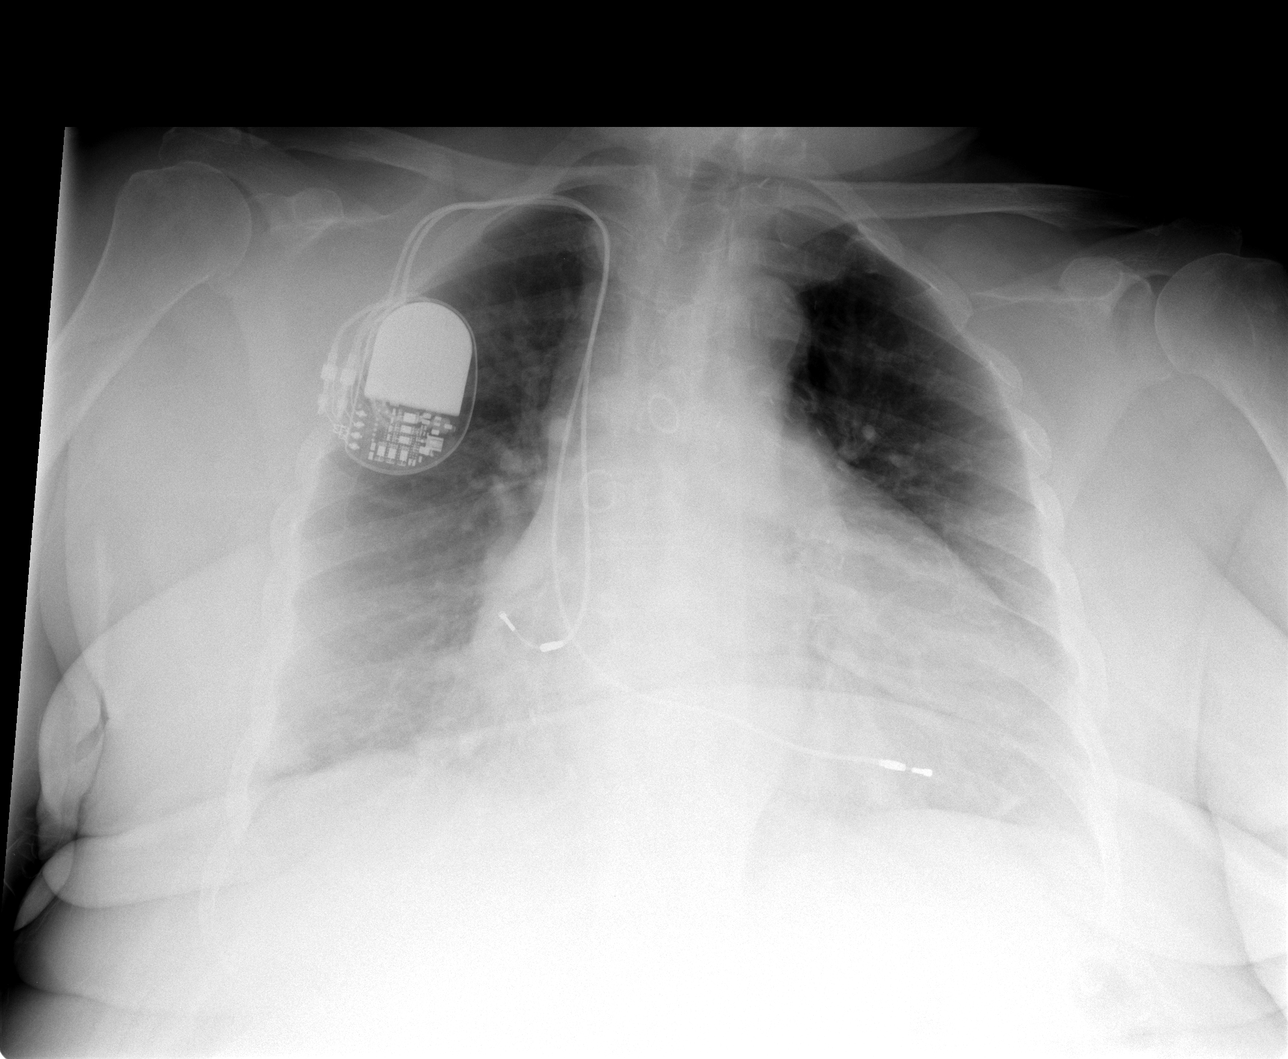

[view not recorded (2 of 3)]
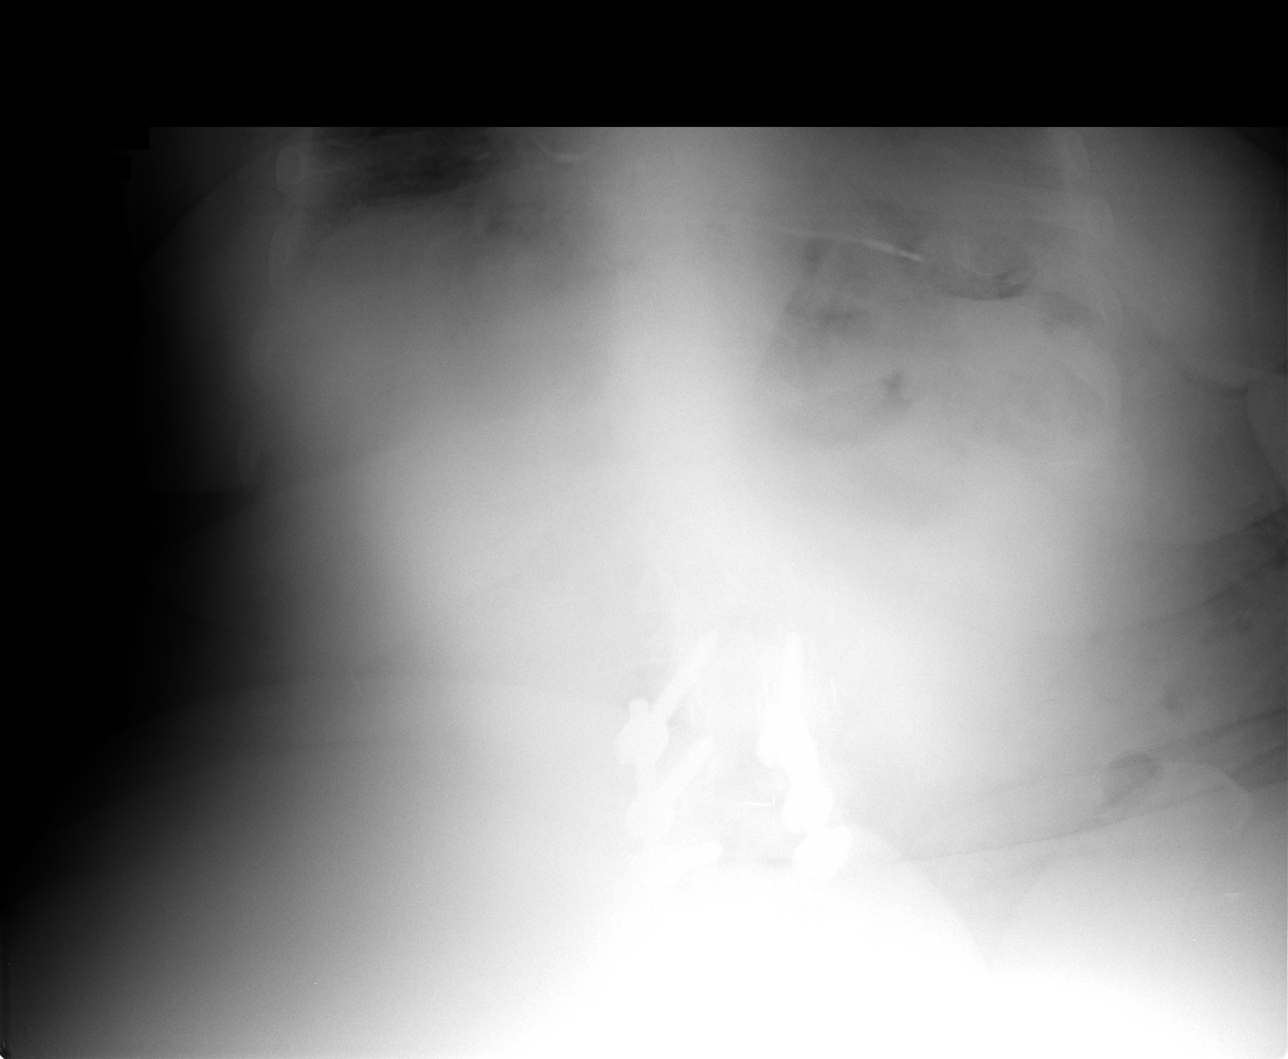

[view not recorded (3 of 3)]
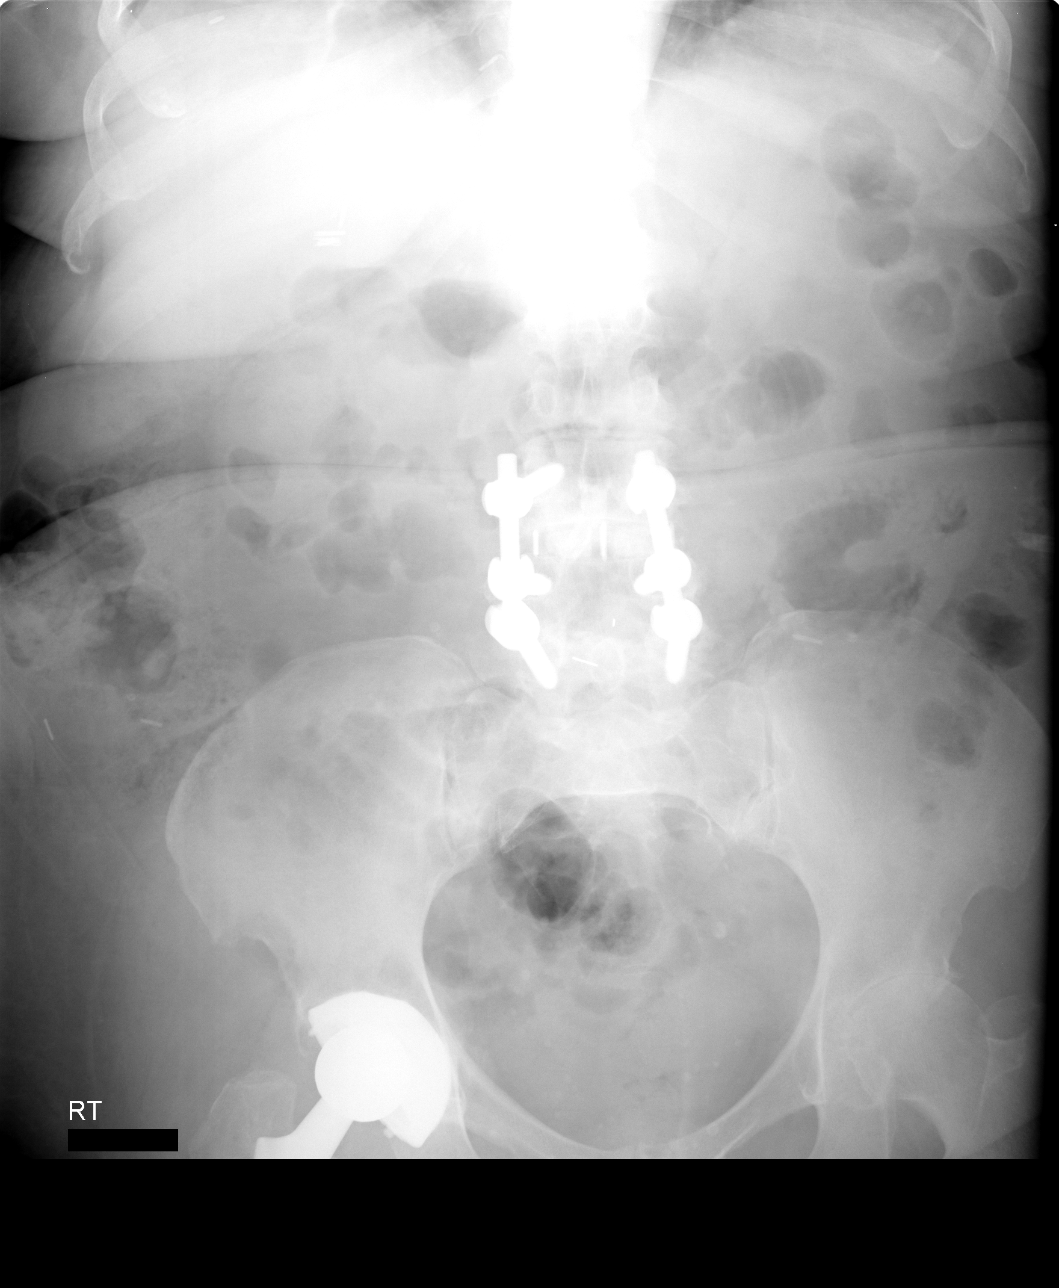

[3 of 3 positions shown; findings below may reference images not displayed]

FINDINGS: Single view chest demonstrates a right dual lead cardiac
pacemaker.  Heart size appears to be enlarged and may be
accentuated by the technique.  Slightly prominent central vascular
markings, particularly at the right lung base.  There is a
nonspecific abdominal bowel gas pattern.  Previous lumbar spine
surgery and right hip replacement.  Multiple calcifications in the
pelvis. Limited upright view but no clear evidence for free air.
IMPRESSION: Nonspecific bowel gas pattern.

Cardiomegaly with mild peribronchial thickening in the right lung
and cannot exclude basilar densities.  Findings could be related to
dependent edema or atelectasis.

## 2014-03-15 IMAGING — CT CT ABD-PELV W/ CM
2 of 4 series · 16 of 46 positions shown, 18 images · IV contrast (Omnipaque 300)
Comparison: 03/08/2011.

CLINICAL DATA: Abdominal pain.  Nausea.  Vomiting.  Diarrhea.

CT ABDOMEN AND PELVIS WITH CONTRAST
TECHNIQUE: Multidetector CT imaging of the abdomen and pelvis was
performed following the standard protocol during bolus
administration of intravenous contrast.
Contrast: 100mL OMNIPAQUE IOHEXOL 300 MG/ML  SOLN

[Series 2: abd_pel_with 5.0 b40f · axial · 0.79mm/px · z∈[-444,-74]mm · 13 of 82 slices shown, 15 images]
[im 4/82  soft-tissue]
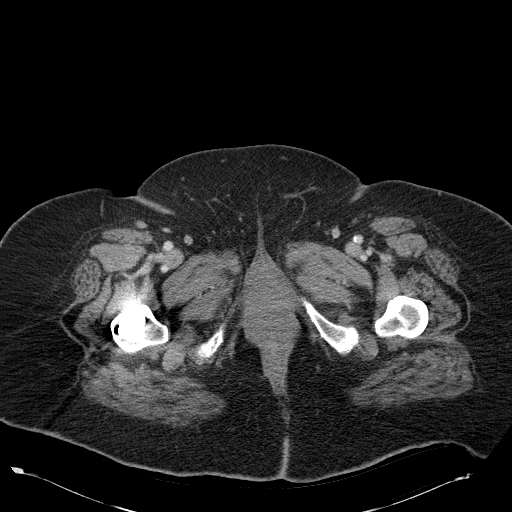
[im 4/82  bone]
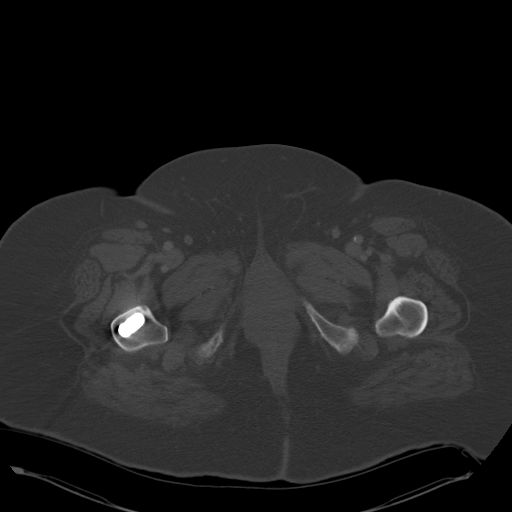
[im 12/82  soft-tissue]
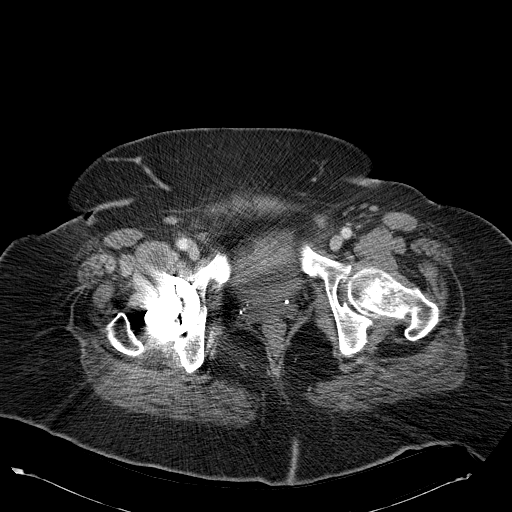
[im 16/82  soft-tissue]
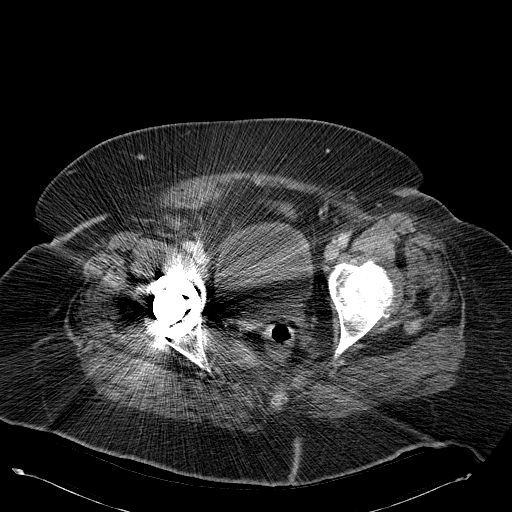
[im 24/82  soft-tissue]
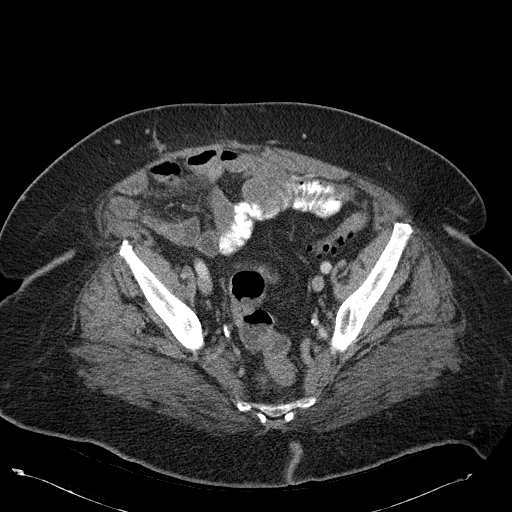
[im 28/82  soft-tissue]
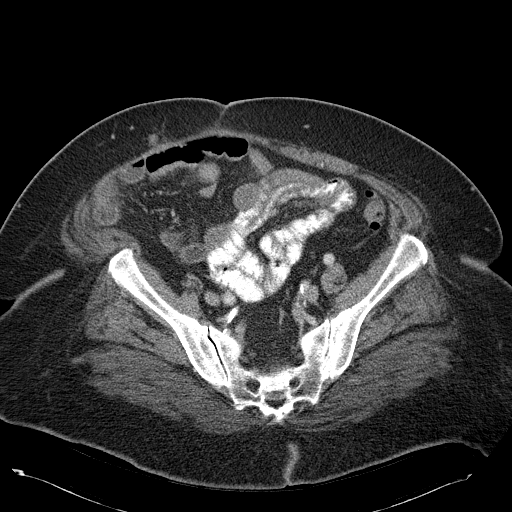
[im 35/82  soft-tissue]
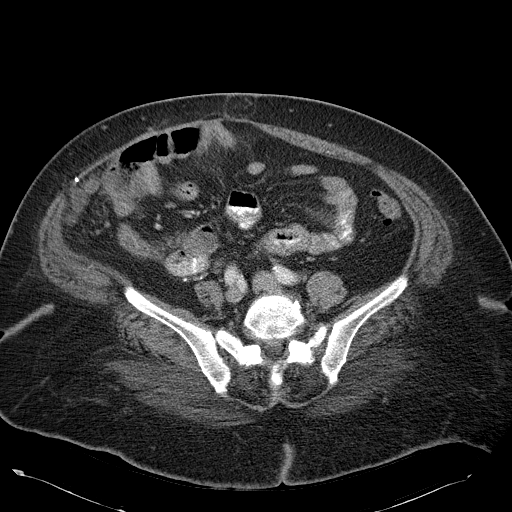
[im 43/82  soft-tissue]
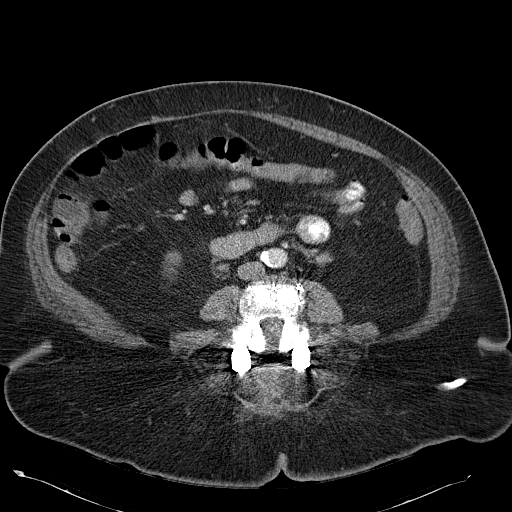
[im 47/82  soft-tissue]
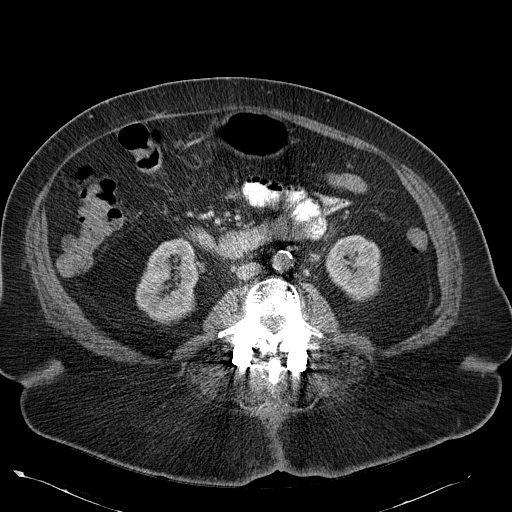
[im 55/82  soft-tissue]
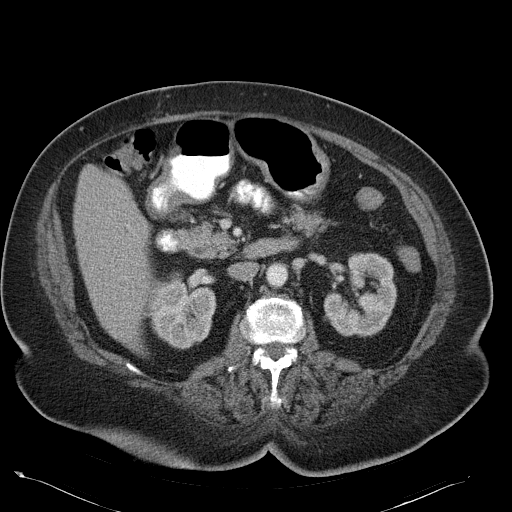
[im 55/82  bone]
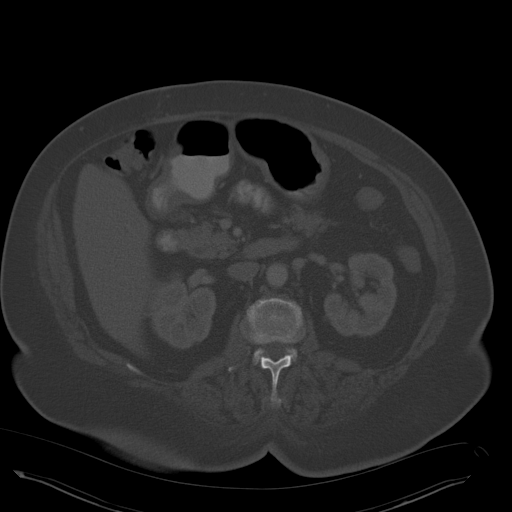
[im 58/82  soft-tissue]
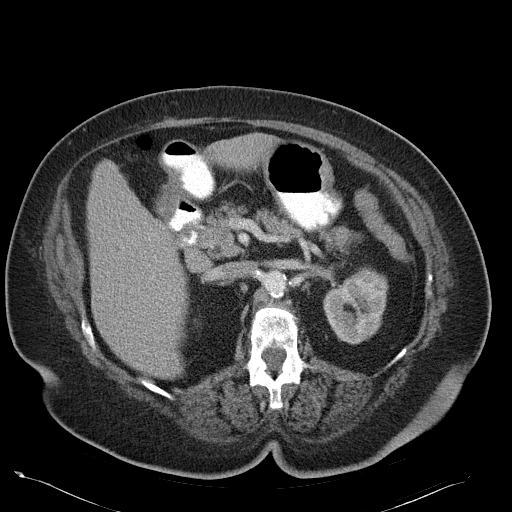
[im 66/82  soft-tissue]
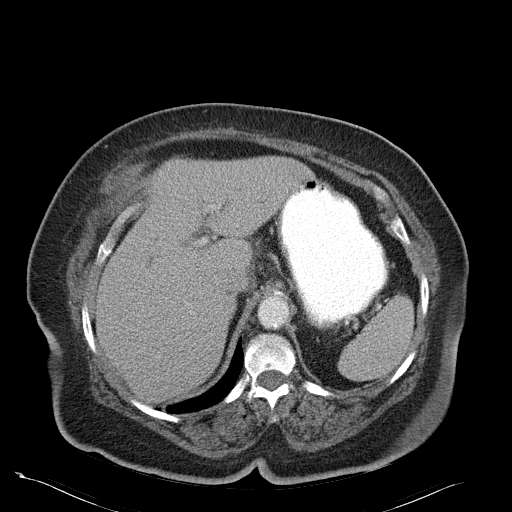
[im 70/82  soft-tissue]
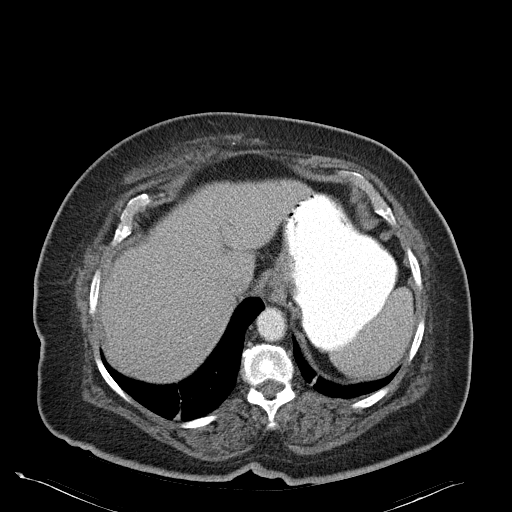
[im 78/82  soft-tissue]
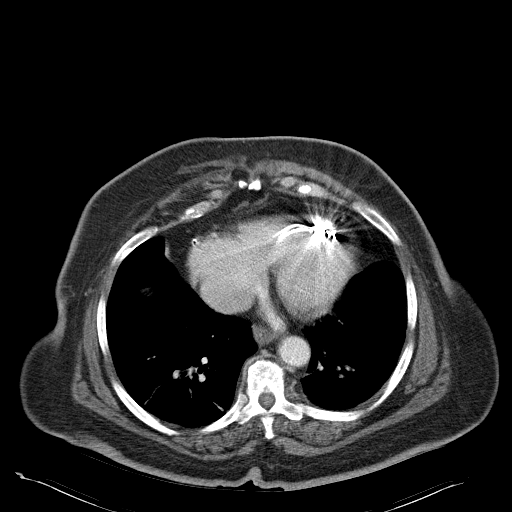

[Series 4: abd_pel_with 3.0 spo · coronal · 0.74mm/px · 3 of 102 slices shown]
[im 34/102  soft-tissue]
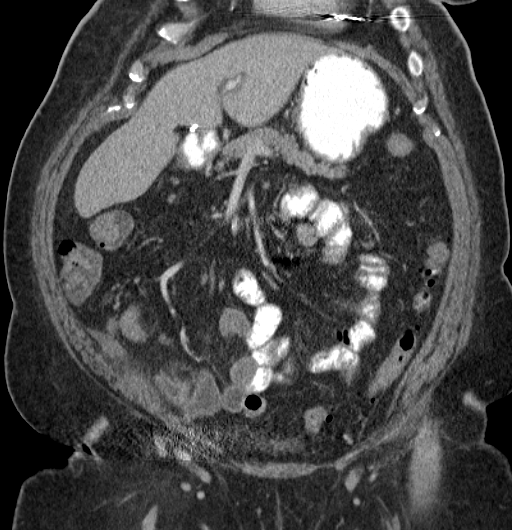
[im 45/102  soft-tissue]
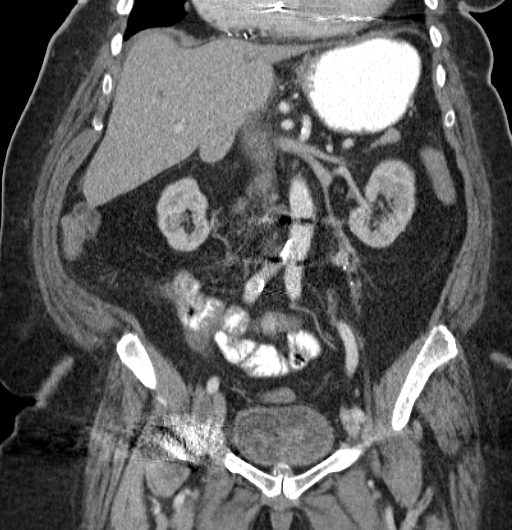
[im 57/102  soft-tissue]
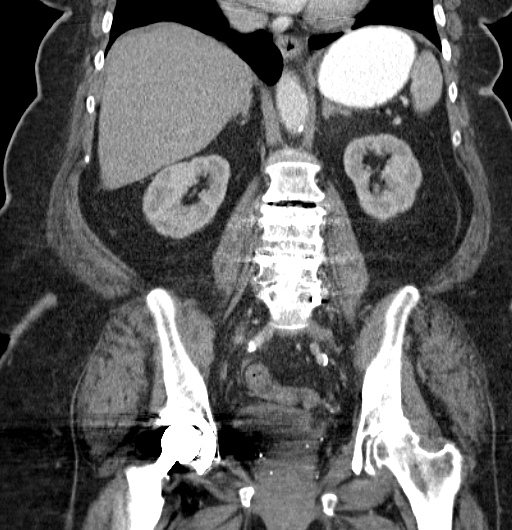

[16 of 46 positions shown; findings below may reference images not displayed]

FINDINGS: Lung Bases: Partially visualized pacemaker leads.
Coronary artery atherosclerosis is present. If office based
assessment of coronary risk factors has not been performed, it is
now recommended.  Lingular atelectasis or scarring.  Bilateral
lower lobe atelectasis.  Lower thoracic aortic atherosclerosis.

Liver:  Normal.

Spleen:  Normal.

Gallbladder:  cholecystectomy.

Common bile duct:  Post cholecystectomy dilation of the common bile
duct.

Pancreas:  Normal.

Adrenal glands:  Mild adrenal hyperplasia.

Kidneys:  Normal enhancement.  Normal delayed excretion of
contrast.  Ureters appear within normal limits.

Stomach:  Normal.

Small bowel:  Normal duodenum.  No small bowel obstruction.  Fat
containing periumbilical hernia.  Scar in the right lower quadrant
abdominal wall.  No dilated small bowel. There is some fecalization
of small bowel in the right lower quadrant suggesting stasis.  Mild
stranding of the fat around the ileum.  Findings suspicious for
enteritis but nonspecific.  This is a new finding compared to the
prior exam.

Colon:   Appendix not identified.  No right lower quadrant
inflammatory changes.  Colonic diverticulosis without
diverticulitis.

Pelvic Genitourinary:  No free fluid.  Urinary bladder appears
within normal limits.  Scatter artifact from the right hip
arthroplasty.

Bones:  No aggressive osseous lesions.  Right total hip
arthroplasty.  Bilateral SI joint degenerative disease.
Postoperative changes of posterior lumbar interbody fusion from L3-
L5.  Severe lumbar spondylosis at other levels.  Severe left hip
osteoarthritis.

Vasculature: Thoracic, abdominal and iliofemoral atherosclerosis.
No aneurysm.
IMPRESSION: 1. Mild stranding around small bowel the right lower quadrant
without evidence of obstruction.  Findings suggestive of enteritis
although nonspecific.  Differential considerations include
infection, inflammatory bowel disease or ischemia.  The superior
mesenteric artery appears adequately patent.
2.  Fat containing periumbilical hernia.
3.  Cholecystectomy.

## 2014-04-16 ENCOUNTER — Encounter: Payer: Self-pay | Admitting: *Deleted

## 2014-05-15 ENCOUNTER — Encounter: Payer: Self-pay | Admitting: *Deleted

## 2014-05-20 ENCOUNTER — Telehealth: Payer: Self-pay

## 2014-05-20 ENCOUNTER — Other Ambulatory Visit: Payer: Self-pay

## 2014-05-20 MED ORDER — NITROGLYCERIN 0.4 MG SL SUBL: 0.4000 mg | SUBLINGUAL_TABLET | SUBLINGUAL | Status: DC | PRN

## 2014-05-20 NOTE — Telephone Encounter (Signed)
Pt called requesting NTG refill,last filled 06/2013  I asked her to call us if her use of NTG increases,has fu apt next month

## 2014-06-19 NOTE — Telephone Encounter (Signed)
Close Encounter 

## 2014-06-25 ENCOUNTER — Encounter: Payer: Self-pay | Admitting: *Deleted

## 2014-07-23 IMAGING — CT CT L SPINE W/O CM
4 of 11 series · 10 of 35 positions shown, 12 images · non-contrast
Comparison: 10/18/2010.

CLINICAL DATA: 71-year-old female with low back pain and left leg
pain.  Prior surgery.  Pacemaker.

CT LUMBAR SPINE WITHOUT CONTRAST
TECHNIQUE: Multidetector CT imaging of the lumbar spine was
performed without intravenous contrast administration. Multiplanar
CT image reconstructions were also generated.

[Series 4: l spine bone · axial · 0.27mm/px · z∈[+138,+208]mm · 2 of 85 slices shown, 3 images]
[im 29/85  soft-tissue]
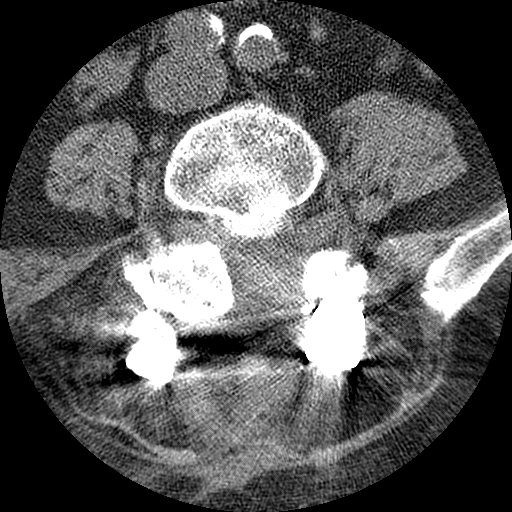
[im 29/85  bone]
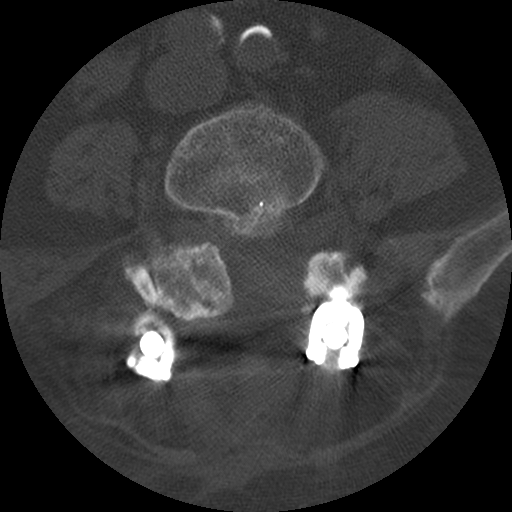
[im 57/85  bone]
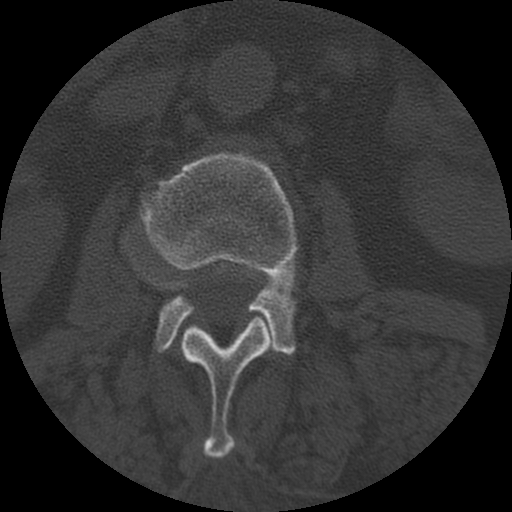

[Series 5: l-spine detail · axial · 0.27mm/px · z∈[+138,+208]mm · 2 of 85 slices shown]
[im 29/85  bone]
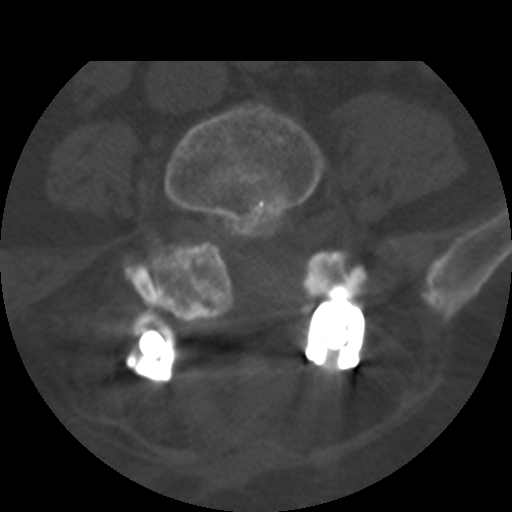
[im 57/85  bone]
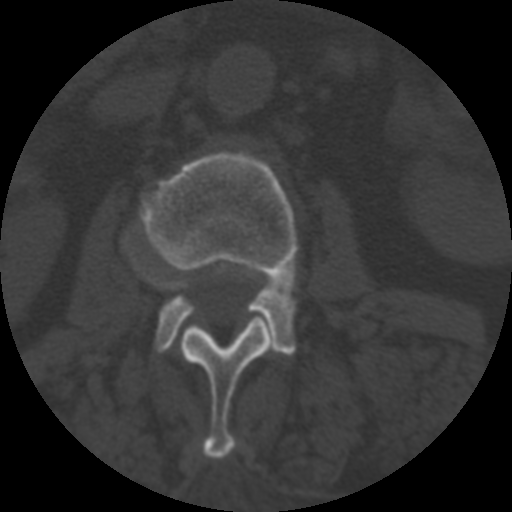

[Series 201: coronal #2 · coronal · 0.42mm/px · 1 of 51 slices shown]
[im 26/51  bone]
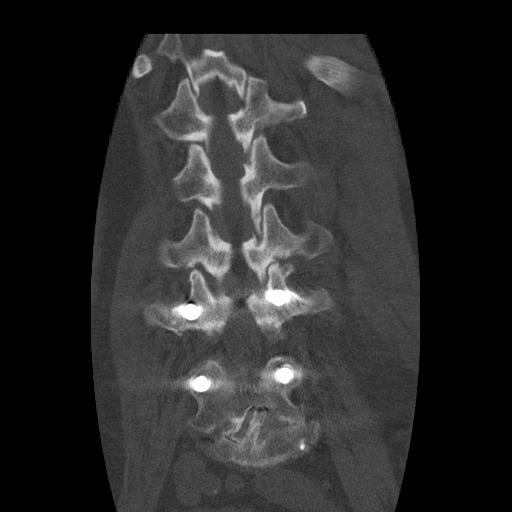

[Series 202: sagittal · sagittal · 0.42mm/px · 5 of 46 slices shown, 6 images]
[im 16/46  bone]
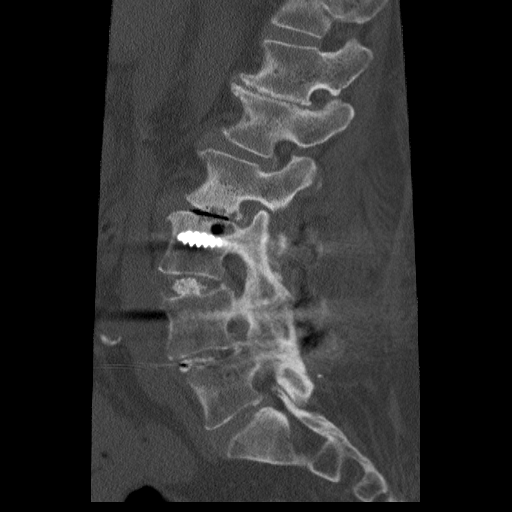
[im 19/46  bone]
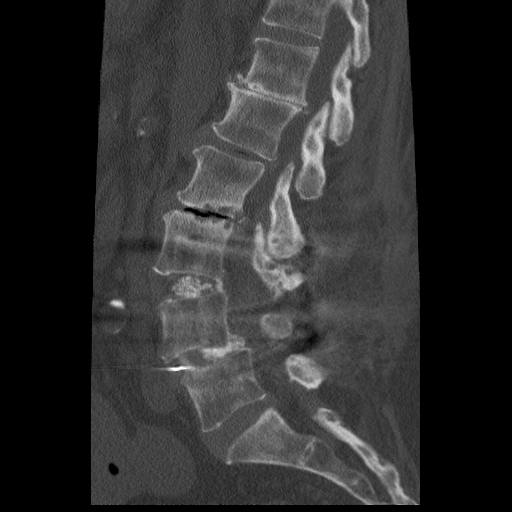
[im 23/46  soft-tissue]
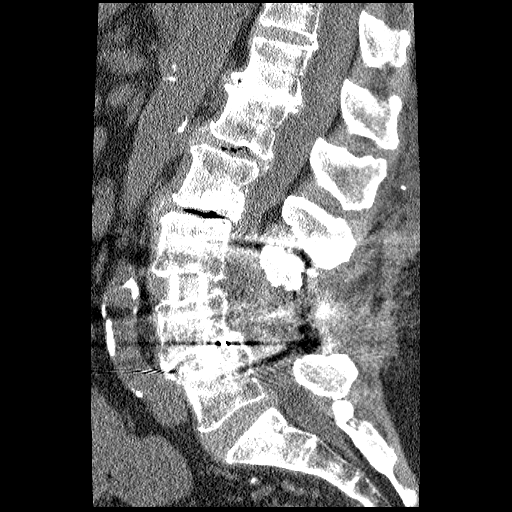
[im 23/46  bone]
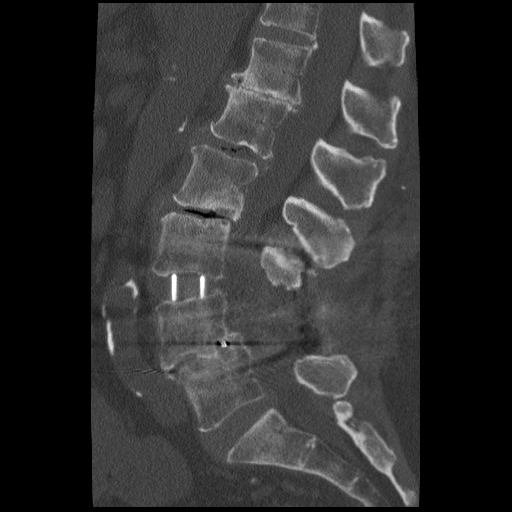
[im 27/46  bone]
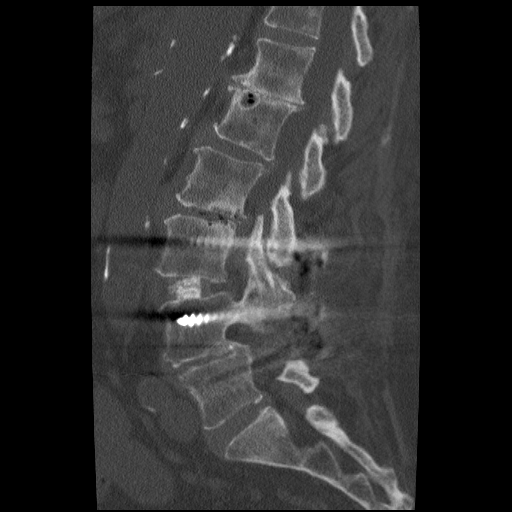
[im 31/46  bone]
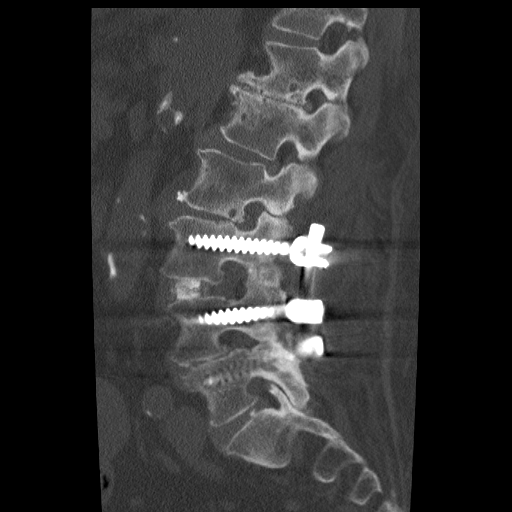

[10 of 35 positions shown; findings below may reference images not displayed]

FINDINGS: Scout view demonstrate sequelae of lumbar surgery as well
as cardiac pacemaker lead and sequelae of right hip arthroplasty.

Overall stable vertebral height and alignment.  Postoperative
changes at L3-L4 and L4-L5 described below.  Advanced chronic
lumbar disc degeneration elsewhere, sparing the L5-S1 level.
Details below.  Visualized sacrum and SI joints are within normal
limits.

Calcified atherosclerosis of the aorta and its branches.  Stable
visualized abdominal viscera.

T11-T12: Small calcified central disc protrusion, otherwise
negative.

T12-L1:  Chronic severe disc space loss with endplate sclerosis and
circumferential endplate spurring.  Occasional gas containing
Schmorl's nodes.  No significant spinal stenosis.  Mild left
greater than right T12 foraminal stenosis primarily due to endplate
spurring.

L1-L2:  Chronic disc space loss.  New vacuum disc phenomena.
Chronic circumferential disc osteophyte complex eccentric to the
right has not significantly changed.  No significant spinal
stenosis.

L2-L3:  Chronic disc space loss and vacuum disc phenomena have
increased.  Bulky circumferential disc osteophyte complex.  Chronic
facet hypertrophy and ligament flavum hypertrophy has increased and
is severe.  Subsequent spinal stenosis appears mildly increased and
is severe.  Mild to moderate bilateral foraminal stenosis may be
mildly increased.

L3-L4:  Sequelae of decompression and fusion with transpedicular
and interbody hardware.  Hardware is stable.  The solid posterior
element arthrodesis.  Less pronounced interbody ossification.  No
evidence of spinal or foraminal stenosis.

L4-L5:  Sequelae of decompression and fusion with transpedicular
and interbody hardware.  Hardware stable and intact.  Stable grade
1 anterolisthesis at this level.  Solid posterior element
arthrodesis.  Solid posterior interbody arthrodesis.  Stable spinal
canal and neural foramina.  There may be mild right lateral recess
and right foraminal stenosis as before.

L5-S1:  Chronic circumferential disc bulge.  Chronic severe facet
degeneration.  Decreased vacuum phenomena in the facets.  Stable
ligament flavum hypertrophy.  No significant spinal or lateral
recess stenosis.  No significant foraminal stenosis.
IMPRESSION: 1.  Stable postoperative appearance of L3-L4 and L4-L5 with solid
mostly posterior element arthrodesis.
2.  Adjacent segment disease at L2-L3 is progressed now with severe
multifactorial spinal stenosis.
3.  Other lumbar levels are stable since [DATE].

## 2014-08-30 IMAGING — CR DG ABDOMEN ACUTE W/ 1V CHEST
3 series · 3 of 3 positions shown · non-contrast
Comparison: Previous examinations.

CLINICAL DATA: Abdominal pain, weakness and shortness of breath.

ACUTE ABDOMEN SERIES (ABDOMEN 2 VIEW & CHEST 1 VIEW)

[view not recorded (1 of 3)]
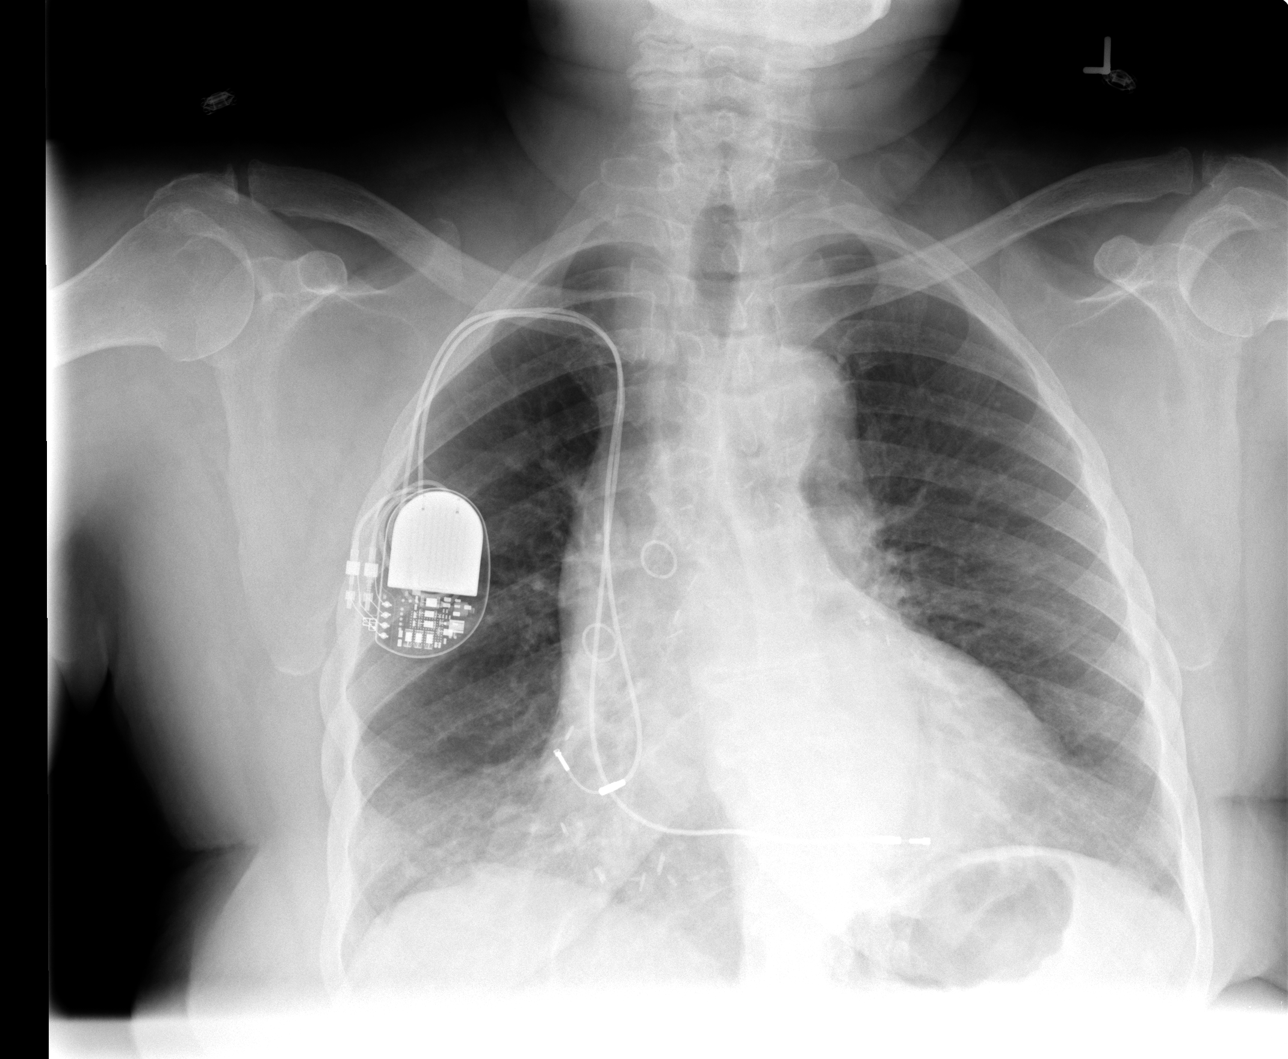

[view not recorded (2 of 3)]
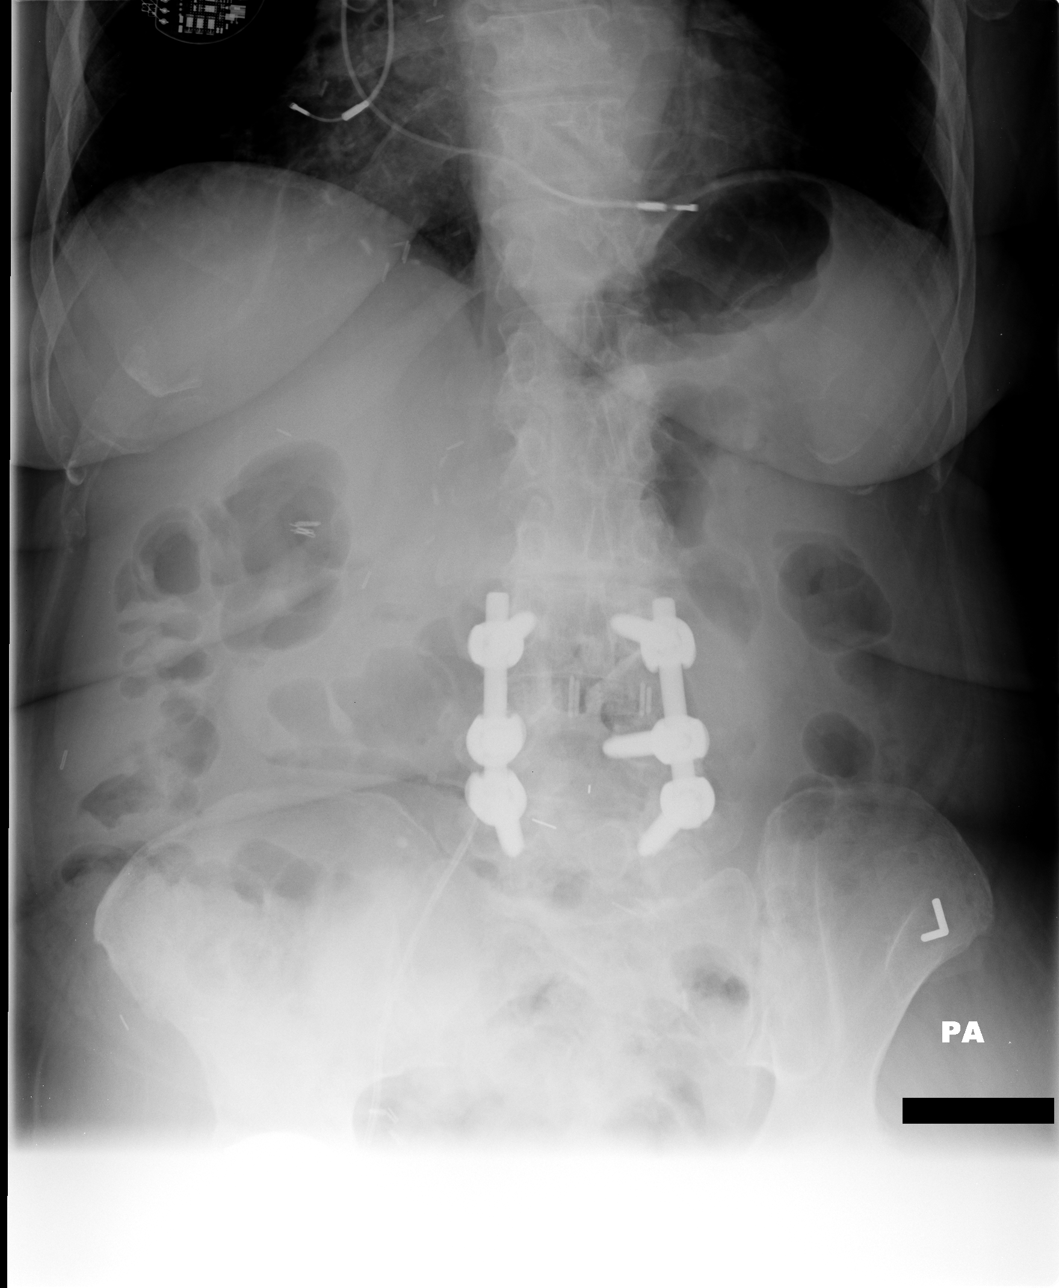

[view not recorded (3 of 3)]
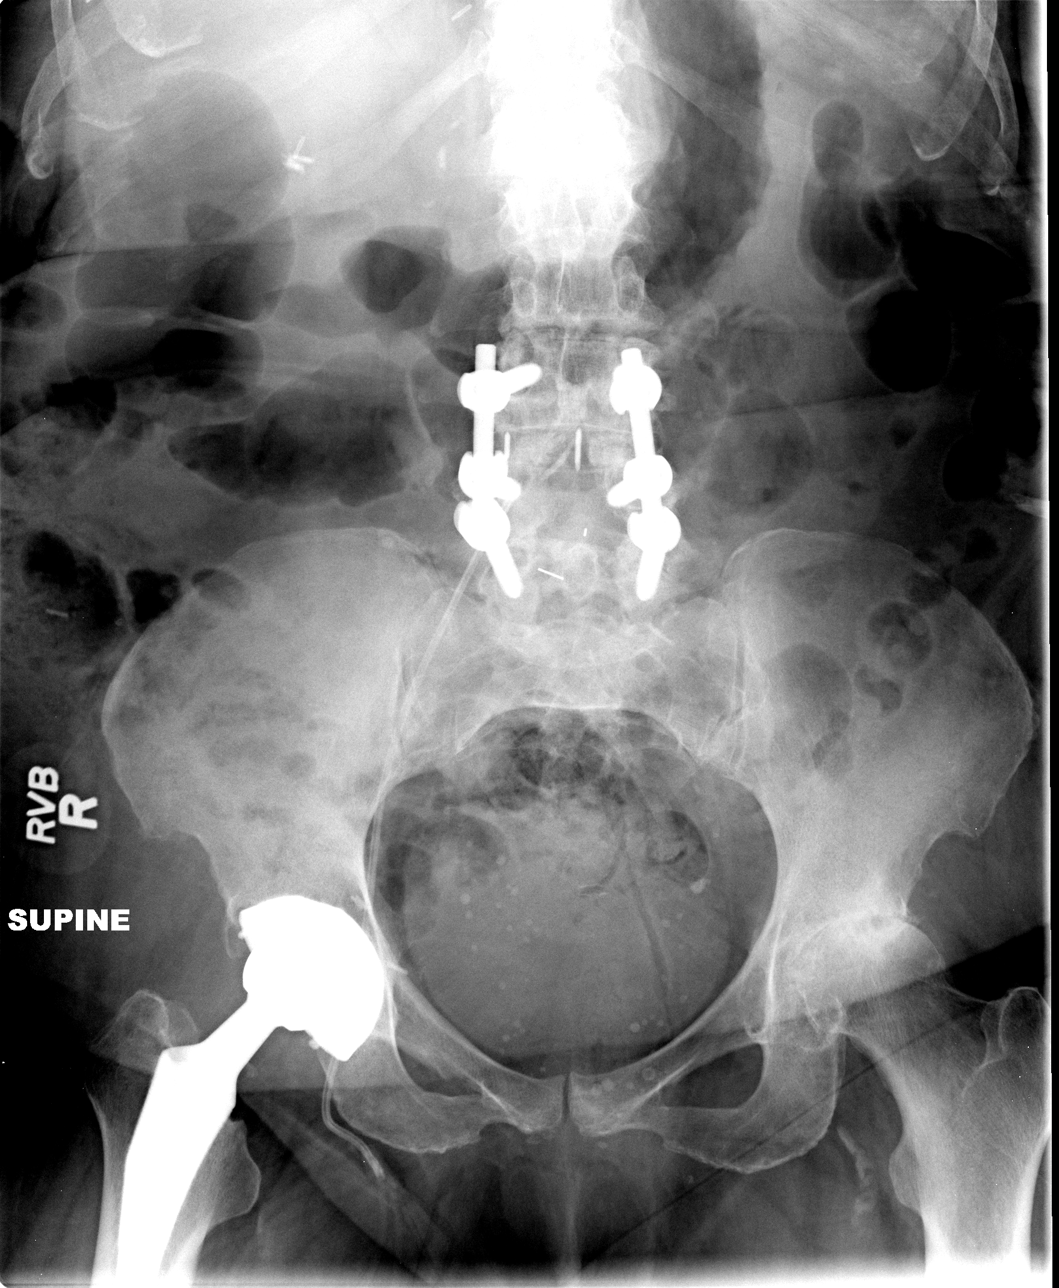

[3 of 3 positions shown; findings below may reference images not displayed]

FINDINGS: Stable enlarged cardiac silhouette, right subclavian
pacemaker leads and post CABG changes.  Stable mildly prominent
interstitial markings.  Normal bowel gas pattern without free
peritoneal air.  Right jugular catheter tip at the inferior aspect
of the inferior vena cava.  Lower lumbar spine laminectomy defects
and fixation hardware.  Cholecystectomy clips.  Marked left hip
degenerative changes.  Right total hip prosthesis.
IMPRESSION: 1.  No acute abnormality.
2.  Stable cardiomegaly and mild chronic interstitial lung disease.

## 2014-09-10 ENCOUNTER — Telehealth: Payer: Self-pay | Admitting: Cardiology

## 2014-09-10 MED ORDER — FUROSEMIDE 20 MG PO TABS: 20.0000 mg | ORAL_TABLET | Freq: Every morning | ORAL | Status: DC

## 2014-09-10 MED ORDER — ATORVASTATIN CALCIUM 40 MG PO TABS: 40.0000 mg | ORAL_TABLET | Freq: Every day | ORAL | Status: DC

## 2014-09-10 NOTE — Telephone Encounter (Signed)
Received fax refill request  Rx # [id number]  Medication:  Furosemide 20 mg tablet Qty 30 Sig:  Take one tablet by mouth in the morning  Physician:  Wyline MoodBranch

## 2014-09-10 NOTE — Telephone Encounter (Signed)
Received fax refill request  Rx # [id number] Medication:  Atorvastatin 40 mg tablet Qty 30 Sig:  Take one tablet by mouth at bedtime for cholestrol Physician:  Wyline MoodBranch

## 2014-10-08 ENCOUNTER — Other Ambulatory Visit: Payer: Self-pay | Admitting: Cardiology

## 2014-10-09 ENCOUNTER — Encounter (HOSPITAL_COMMUNITY): Payer: Self-pay | Admitting: Emergency Medicine

## 2014-10-09 ENCOUNTER — Inpatient Hospital Stay (HOSPITAL_COMMUNITY)
Admission: EM | Admit: 2014-10-09 | Discharge: 2014-10-12 | DRG: 388 | Disposition: A | Discharge status: Home Health | Payer: PRIVATE HEALTH INSURANCE | Attending: Pulmonary Disease | Admitting: Pulmonary Disease

## 2014-10-09 ENCOUNTER — Emergency Department (HOSPITAL_COMMUNITY): Payer: PRIVATE HEALTH INSURANCE

## 2014-10-09 DIAGNOSIS — R1084 Generalized abdominal pain: Secondary | ICD-10-CM

## 2014-10-09 DIAGNOSIS — I5032 Chronic diastolic (congestive) heart failure: Secondary | ICD-10-CM | POA: Diagnosis present

## 2014-10-09 DIAGNOSIS — G40909 Epilepsy, unspecified, not intractable, without status epilepticus: Secondary | ICD-10-CM

## 2014-10-09 DIAGNOSIS — E119 Type 2 diabetes mellitus without complications: Secondary | ICD-10-CM

## 2014-10-09 DIAGNOSIS — E785 Hyperlipidemia, unspecified: Secondary | ICD-10-CM | POA: Diagnosis present

## 2014-10-09 DIAGNOSIS — Z79899 Other long term (current) drug therapy: Secondary | ICD-10-CM

## 2014-10-09 DIAGNOSIS — R1013 Epigastric pain: Secondary | ICD-10-CM | POA: Diagnosis present

## 2014-10-09 DIAGNOSIS — J45909 Unspecified asthma, uncomplicated: Secondary | ICD-10-CM | POA: Diagnosis present

## 2014-10-09 DIAGNOSIS — G9341 Metabolic encephalopathy: Secondary | ICD-10-CM | POA: Diagnosis present

## 2014-10-09 DIAGNOSIS — Z96641 Presence of right artificial hip joint: Secondary | ICD-10-CM | POA: Diagnosis present

## 2014-10-09 DIAGNOSIS — I10 Essential (primary) hypertension: Secondary | ICD-10-CM | POA: Diagnosis present

## 2014-10-09 DIAGNOSIS — E860 Dehydration: Secondary | ICD-10-CM | POA: Diagnosis present

## 2014-10-09 DIAGNOSIS — G4730 Sleep apnea, unspecified: Secondary | ICD-10-CM | POA: Diagnosis present

## 2014-10-09 DIAGNOSIS — F17220 Nicotine dependence, chewing tobacco, uncomplicated: Secondary | ICD-10-CM | POA: Diagnosis present

## 2014-10-09 DIAGNOSIS — I25810 Atherosclerosis of coronary artery bypass graft(s) without angina pectoris: Secondary | ICD-10-CM | POA: Diagnosis present

## 2014-10-09 DIAGNOSIS — D649 Anemia, unspecified: Secondary | ICD-10-CM | POA: Diagnosis present

## 2014-10-09 DIAGNOSIS — I252 Old myocardial infarction: Secondary | ICD-10-CM

## 2014-10-09 DIAGNOSIS — Z833 Family history of diabetes mellitus: Secondary | ICD-10-CM

## 2014-10-09 DIAGNOSIS — M48062 Spinal stenosis, lumbar region with neurogenic claudication: Secondary | ICD-10-CM | POA: Diagnosis present

## 2014-10-09 DIAGNOSIS — Z951 Presence of aortocoronary bypass graft: Secondary | ICD-10-CM

## 2014-10-09 DIAGNOSIS — R509 Fever, unspecified: Secondary | ICD-10-CM

## 2014-10-09 DIAGNOSIS — E1165 Type 2 diabetes mellitus with hyperglycemia: Secondary | ICD-10-CM | POA: Diagnosis present

## 2014-10-09 DIAGNOSIS — K5641 Fecal impaction: Secondary | ICD-10-CM | POA: Diagnosis not present

## 2014-10-09 DIAGNOSIS — Z800 Family history of malignant neoplasm of digestive organs: Secondary | ICD-10-CM

## 2014-10-09 DIAGNOSIS — K219 Gastro-esophageal reflux disease without esophagitis: Secondary | ICD-10-CM | POA: Diagnosis present

## 2014-10-09 DIAGNOSIS — R1110 Vomiting, unspecified: Secondary | ICD-10-CM

## 2014-10-09 DIAGNOSIS — J449 Chronic obstructive pulmonary disease, unspecified: Secondary | ICD-10-CM | POA: Diagnosis present

## 2014-10-09 DIAGNOSIS — Z8249 Family history of ischemic heart disease and other diseases of the circulatory system: Secondary | ICD-10-CM

## 2014-10-09 DIAGNOSIS — R112 Nausea with vomiting, unspecified: Secondary | ICD-10-CM | POA: Diagnosis present

## 2014-10-09 DIAGNOSIS — Z7982 Long term (current) use of aspirin: Secondary | ICD-10-CM

## 2014-10-09 DIAGNOSIS — R4182 Altered mental status, unspecified: Secondary | ICD-10-CM | POA: Diagnosis present

## 2014-10-09 DIAGNOSIS — Z8673 Personal history of transient ischemic attack (TIA), and cerebral infarction without residual deficits: Secondary | ICD-10-CM

## 2014-10-09 DIAGNOSIS — Z950 Presence of cardiac pacemaker: Secondary | ICD-10-CM

## 2014-10-09 LAB — CBC WITH DIFFERENTIAL/PLATELET
BASOS PCT: 0 % (ref 0–1)
Basophils Absolute: 0 10*3/uL (ref 0.0–0.1)
EOS PCT: 0 % (ref 0–5)
Eosinophils Absolute: 0 10*3/uL (ref 0.0–0.7)
HEMATOCRIT: 36 % (ref 36.0–46.0)
Hemoglobin: 11.5 g/dL — ABNORMAL LOW (ref 12.0–15.0)
Lymphocytes Relative: 8 % — ABNORMAL LOW (ref 12–46)
Lymphs Abs: 0.8 10*3/uL (ref 0.7–4.0)
MCH: 26 pg (ref 26.0–34.0)
MCHC: 31.9 g/dL (ref 30.0–36.0)
MCV: 81.3 fL (ref 78.0–100.0)
MONO ABS: 0.4 10*3/uL (ref 0.1–1.0)
Monocytes Relative: 5 % (ref 3–12)
Neutro Abs: 8.4 10*3/uL — ABNORMAL HIGH (ref 1.7–7.7)
Neutrophils Relative %: 87 % — ABNORMAL HIGH (ref 43–77)
Platelets: 261 10*3/uL (ref 150–400)
RBC: 4.43 MIL/uL (ref 3.87–5.11)
RDW: 15.8 % — AB (ref 11.5–15.5)
WBC: 9.6 10*3/uL (ref 4.0–10.5)

## 2014-10-09 LAB — COMPREHENSIVE METABOLIC PANEL
ALBUMIN: 4.2 g/dL (ref 3.5–5.2)
ALT: 13 U/L (ref 0–35)
AST: 28 U/L (ref 0–37)
Alkaline Phosphatase: 89 U/L (ref 39–117)
Anion gap: 18 — ABNORMAL HIGH (ref 5–15)
BUN: 12 mg/dL (ref 6–23)
CALCIUM: 10.9 mg/dL — AB (ref 8.4–10.5)
CO2: 24 meq/L (ref 19–32)
CREATININE: 1.23 mg/dL — AB (ref 0.50–1.10)
Chloride: 100 mEq/L (ref 96–112)
GFR calc Af Amer: 48 mL/min — ABNORMAL LOW (ref >90)
GFR, EST NON AFRICAN AMERICAN: 42 mL/min — AB (ref >90)
Glucose, Bld: 190 mg/dL — ABNORMAL HIGH (ref 70–99)
Potassium: 3.5 mEq/L — ABNORMAL LOW (ref 3.7–5.3)
Sodium: 142 mEq/L (ref 137–147)
Total Bilirubin: 0.7 mg/dL (ref 0.3–1.2)
Total Protein: 9.1 g/dL — ABNORMAL HIGH (ref 6.0–8.3)

## 2014-10-09 LAB — GLUCOSE, CAPILLARY
GLUCOSE-CAPILLARY: 134 mg/dL — AB (ref 70–99)
Glucose-Capillary: 149 mg/dL — ABNORMAL HIGH (ref 70–99)

## 2014-10-09 LAB — TROPONIN I
Troponin I: <0.3 ng/mL (ref <0.30)
Troponin I: <0.3 ng/mL (ref <0.30)

## 2014-10-09 LAB — URINALYSIS, ROUTINE W REFLEX MICROSCOPIC
Bilirubin Urine: NEGATIVE
Glucose, UA: 100 mg/dL — AB
LEUKOCYTES UA: NEGATIVE
Nitrite: NEGATIVE
PH: 7 (ref 5.0–8.0)
Protein, ur: 100 mg/dL — AB
Specific Gravity, Urine: 1.015 (ref 1.005–1.030)
Urobilinogen, UA: 0.2 mg/dL (ref 0.0–1.0)

## 2014-10-09 LAB — INFLUENZA PANEL BY PCR (TYPE A & B)
H1N1 flu by pcr: NOT DETECTED
INFLBPCR: NEGATIVE
Influenza A By PCR: NEGATIVE

## 2014-10-09 LAB — URINE MICROSCOPIC-ADD ON

## 2014-10-09 LAB — LACTIC ACID, PLASMA: LACTIC ACID, VENOUS: 1.6 mmol/L (ref 0.5–2.2)

## 2014-10-09 LAB — MRSA PCR SCREENING: MRSA by PCR: POSITIVE — AB

## 2014-10-09 LAB — PROTIME-INR
INR: 1.12 (ref 0.00–1.49)
Prothrombin Time: 14.5 seconds (ref 11.6–15.2)

## 2014-10-09 LAB — LIPASE, BLOOD: Lipase: 34 U/L (ref 11–59)

## 2014-10-09 LAB — POC OCCULT BLOOD, ED: Fecal Occult Bld: NEGATIVE

## 2014-10-09 MED ORDER — CHLORHEXIDINE GLUCONATE CLOTH 2 % EX PADS
6.0000 | MEDICATED_PAD | Freq: Every day | CUTANEOUS | Status: DC
  Administered 2014-10-10 – 2014-10-12 (×3): 6 via TOPICAL

## 2014-10-09 MED ORDER — OXYBUTYNIN CHLORIDE ER 5 MG PO TB24
10.0000 mg | ORAL_TABLET | Freq: Every morning | ORAL | Status: DC
Start: 2014-10-09 — End: 2014-10-12
  Administered 2014-10-09 – 2014-10-12 (×4): 10 mg via ORAL
  Filled 2014-10-09 (×4): qty 2

## 2014-10-09 MED ORDER — INSULIN ASPART 100 UNIT/ML ~~LOC~~ SOLN
0.0000 [IU] | Freq: Three times a day (TID) | SUBCUTANEOUS | Status: DC
  Administered 2014-10-09 – 2014-10-11 (×6): 1 [IU] via SUBCUTANEOUS
  Administered 2014-10-12: 2 [IU] via SUBCUTANEOUS
  Administered 2014-10-12: 1 [IU] via SUBCUTANEOUS

## 2014-10-09 MED ORDER — IOHEXOL 300 MG/ML  SOLN
50.0000 mL | Freq: Once | INTRAMUSCULAR | Status: AC | PRN
  Administered 2014-10-09: 50 mL via ORAL

## 2014-10-09 MED ORDER — PANTOPRAZOLE SODIUM 40 MG PO TBEC
40.0000 mg | DELAYED_RELEASE_TABLET | Freq: Every day | ORAL | Status: DC
  Administered 2014-10-09 – 2014-10-12 (×4): 40 mg via ORAL
  Filled 2014-10-09 (×4): qty 1

## 2014-10-09 MED ORDER — LEVETIRACETAM 500 MG PO TABS
500.0000 mg | ORAL_TABLET | Freq: Two times a day (BID) | ORAL | Status: DC
  Administered 2014-10-09 – 2014-10-12 (×7): 500 mg via ORAL
  Filled 2014-10-09 (×7): qty 1

## 2014-10-09 MED ORDER — ACETAMINOPHEN 325 MG PO TABS
650.0000 mg | ORAL_TABLET | Freq: Four times a day (QID) | ORAL | Status: DC | PRN
  Administered 2014-10-10: 650 mg via ORAL
  Filled 2014-10-09: qty 2

## 2014-10-09 MED ORDER — POLYETHYLENE GLYCOL 3350 17 G PO PACK: 17.0000 g | PACK | Freq: Every day | ORAL | Status: DC | PRN

## 2014-10-09 MED ORDER — SODIUM CHLORIDE 0.9 % IV BOLUS (SEPSIS)
500.0000 mL | Freq: Once | INTRAVENOUS | Status: AC
  Administered 2014-10-09: 500 mL via INTRAVENOUS

## 2014-10-09 MED ORDER — HEPARIN SODIUM (PORCINE) 5000 UNIT/ML IJ SOLN
5000.0000 [IU] | Freq: Three times a day (TID) | INTRAMUSCULAR | Status: DC
  Administered 2014-10-09 – 2014-10-12 (×9): 5000 [IU] via SUBCUTANEOUS
  Filled 2014-10-09 (×9): qty 1

## 2014-10-09 MED ORDER — ASPIRIN EC 81 MG PO TBEC
81.0000 mg | DELAYED_RELEASE_TABLET | Freq: Every day | ORAL | Status: DC
  Administered 2014-10-09 – 2014-10-12 (×4): 81 mg via ORAL
  Filled 2014-10-09 (×4): qty 1

## 2014-10-09 MED ORDER — OSELTAMIVIR PHOSPHATE 75 MG PO CAPS
75.0000 mg | ORAL_CAPSULE | Freq: Two times a day (BID) | ORAL | Status: DC
Start: 2014-10-09 — End: 2014-10-09
  Administered 2014-10-09: 75 mg via ORAL
  Filled 2014-10-09: qty 1

## 2014-10-09 MED ORDER — INSULIN ASPART 100 UNIT/ML ~~LOC~~ SOLN: 0.0000 [IU] | Freq: Every day | SUBCUTANEOUS | Status: DC

## 2014-10-09 MED ORDER — OMEGA-3-ACID ETHYL ESTERS 1 G PO CAPS
2.0000 g | ORAL_CAPSULE | Freq: Two times a day (BID) | ORAL | Status: DC
  Administered 2014-10-09 – 2014-10-12 (×6): 2 g via ORAL
  Filled 2014-10-09 (×6): qty 2

## 2014-10-09 MED ORDER — SODIUM CHLORIDE 0.9 % IV SOLN: INTRAVENOUS | Status: AC

## 2014-10-09 MED ORDER — METOPROLOL TARTRATE 25 MG PO TABS
25.0000 mg | ORAL_TABLET | Freq: Two times a day (BID) | ORAL | Status: DC
  Administered 2014-10-09 – 2014-10-12 (×7): 25 mg via ORAL
  Filled 2014-10-09 (×7): qty 1

## 2014-10-09 MED ORDER — ONDANSETRON HCL 4 MG/2ML IJ SOLN
4.0000 mg | Freq: Four times a day (QID) | INTRAMUSCULAR | Status: DC | PRN
  Administered 2014-10-10 (×2): 4 mg via INTRAVENOUS
  Filled 2014-10-09 (×2): qty 2

## 2014-10-09 MED ORDER — POLYETHYLENE GLYCOL 3350 17 G PO PACK
17.0000 g | PACK | Freq: Every day | ORAL | Status: DC | PRN
  Administered 2014-10-10: 17 g via ORAL
  Filled 2014-10-09: qty 1

## 2014-10-09 MED ORDER — IOHEXOL 300 MG/ML  SOLN
100.0000 mL | Freq: Once | INTRAMUSCULAR | Status: AC | PRN
  Administered 2014-10-09: 100 mL via INTRAVENOUS

## 2014-10-09 MED ORDER — KETOROLAC TROMETHAMINE 0.5 % OP SOLN
1.0000 [drp] | Freq: Three times a day (TID) | OPHTHALMIC | Status: DC
Start: 2014-10-09 — End: 2014-10-12
  Administered 2014-10-09 – 2014-10-12 (×8): 1 [drp] via OPHTHALMIC
  Filled 2014-10-09: qty 3

## 2014-10-09 MED ORDER — NITROGLYCERIN 0.4 MG SL SUBL: 0.4000 mg | SUBLINGUAL_TABLET | SUBLINGUAL | Status: DC | PRN

## 2014-10-09 MED ORDER — MUPIROCIN 2 % EX OINT
1.0000 "application " | TOPICAL_OINTMENT | Freq: Two times a day (BID) | CUTANEOUS | Status: DC
  Administered 2014-10-09 – 2014-10-12 (×6): 1 via NASAL
  Filled 2014-10-09 (×2): qty 22

## 2014-10-09 MED ORDER — ATORVASTATIN CALCIUM 40 MG PO TABS
40.0000 mg | ORAL_TABLET | Freq: Every day | ORAL | Status: DC
  Administered 2014-10-09 – 2014-10-11 (×2): 40 mg via ORAL
  Filled 2014-10-09 (×3): qty 1

## 2014-10-09 MED ORDER — ACETAMINOPHEN 650 MG RE SUPP: 650.0000 mg | Freq: Four times a day (QID) | RECTAL | Status: DC | PRN

## 2014-10-09 MED ORDER — ONDANSETRON HCL 4 MG/2ML IJ SOLN
4.0000 mg | Freq: Once | INTRAMUSCULAR | Status: AC
  Administered 2014-10-09: 4 mg via INTRAVENOUS
  Filled 2014-10-09: qty 2

## 2014-10-09 MED ORDER — ONDANSETRON HCL 4 MG PO TABS: 4.0000 mg | ORAL_TABLET | Freq: Four times a day (QID) | ORAL | Status: DC | PRN

## 2014-10-09 NOTE — H&P (Signed)
[facility] History and Physical  [name] ZOX:[id number] DOB: 1941-01-15 DOA: 10/09/2014  Referring physician: Dr. Rosalia Hammersay PCP: Fredirick MaudlinHAWKINS,EDWARD L, MD   Chief Complaint: intractable nausea and vomiting  HPI: [name] is a 73 y.o. female past medical history of coronary artery C status post bypass grafting in 2010 last stress test in 2012 shows an ejection fraction of 62% no signs of ischemia, an echo in 2014 showed diastolic dysfunction with mild tricuspid and mitral regurgitation, bradycardia with Mobitz type II AV block status post PPM implant, diabetes mellitus type 2 last hemoglobin A1c of 6.2, left total hip arthroplasty in January 2011 started having nausea and vomiting that started the day prior to admission this morning she started having had abdominal pain. She relates no radiation no recent change in medications no alcohol abuse. Some fevers at home and temperature 104 and DD. She has not been able to tolerate anything in the last 12-15 hours.no radiation of pain, no diarrhea no hematemesis or melena.  In the ED: Labs as below unremarkable.  Review of Systems:  Constitutional:  No weight loss, night sweats, Fevers, chills, fatigue.  HEENT:  No headaches, Difficulty swallowing,Tooth/dental problems,Sore throat,  No sneezing, itching, ear ache, nasal congestion, post nasal drip,  Cardio-vascular:  No chest pain, Orthopnea, PND, swelling in lower extremities, anasarca, dizziness, palpitations  Resp:  No shortness of breath with exertion or at rest. No excess mucus, no productive cough, No non-productive cough, No coughing up of blood.No change in color of mucus.No wheezing.No chest wall deformity  Skin:  no rash or lesions.  GU:  no dysuria, change in color of urine, no urgency or frequency. No flank pain.  Musculoskeletal:  No joint pain or swelling. No decreased range of motion. No back pain.  Psych:  No change in mood or affect. No depression or anxiety. No  memory loss.   Past Medical History  Diagnosis Date  . Hypertension   . CAD (coronary artery disease)     multivessel, CABG 6/10.  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  Myoview (11/20/11) showed no ischemia & EF 62%  . Hyperlipidemia   . Deep vein thrombosis, upper right extremity     post-PPM  . Obesity   . Dehiscence of closure of sternum or sternotomy 07/2009    sternal infection; required flap closure  . COPD (chronic obstructive pulmonary disease)     severe lung disease by PFTs 6/12  . Seizure disorder   . 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 PUD (peptic ulcer disease)   . Pacemaker   . Sleep apnea     denies CPAP use on 09/30/2013  . Diabetes mellitus, type II   . H/O hiatal hernia   . Migraines     "years ago" (09/30/2013)  . DDD (degenerative disc disease)   . Arthritis     "legs and back" (09/30/2013)  . Chronic lower back pain   . Urge incontinence of urine   . Myocardial infarction     years ago  . Anginal pain     "all the time, but not heart related"  . Asthma     years ago  . Orthopnea     sometimes  . 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 History of kidney stones   . Gastroesophageal reflux disease     not much  . Seizures     "long time ago;  don't remember what they were related to" (09/30/2013)none recent  . Anemia   . Blurred vision, bilateral     sometimes double vision   Past Surgical History  Procedure Laterality Date  . Pacemaker placement  1993    Status post DDD pacemaker implantation in 1993, with Medtronic Kappa generator change in 2003 for  second-degree AV block, most recent gen change by Fawn Kirk 04/14/11  . Total hip arthroplasty Right   . Replacement total knee Bilateral   . Thyroidectomy    . Wrist surgery Left     "tumor taken off" (09/30/2013)  . Coronary artery bypass graft  05/1999    - LIMA to LAD, SVG to OM, SVG to PDA  . Reconstructive repair sternal      for infection s/p CABG 8/10   . Esophagogastroduodenoscopy      with [facility] dilation,  biopsy, and disruption Schatzki's ring  . Colonoscopy  04/27/2005    hemorrhoids, diverticula  . Esophagogastroduodenoscopy  09/10/2012    RMR: Noncritical Schatzki's ring. Diffuse gastric erosions and an  area of partially healing ulceration-status post biopsy/ Small hiatal hernia. Bx reactive gastropathy.  . Colonoscopy with esophagogastroduodenoscopy (egd) N/A 02/20/2013    RMR: pancolonic diverticulosis and internal hemorrhoids. EGD with small hiatal hernia, healed gastric ulcer  . Portacath placement Left 07/17/2013    Procedure: INSERTION PORT-A-CATH-left subclavian;  Surgeon: Fabio Bering, MD;  Location: [facility];  Service: General;  Laterality: Left;  . Appendectomy    . Cholecystectomy    . Insert / replace / remove pacemaker  1993  . Lumbar disc surgery    . Posterior lumbar fusion    . Tubal ligation    . Cesarean section  ~1980  . Abdominal hysterectomy      partial  . Back surgery      x3 total  . Total hip arthroplasty Left 12/03/2013    Procedure: LEFT TOTAL HIP ARTHROPLASTY ANTERIOR APPROACH;  Surgeon: Shelda Pal, MD;  Location: [facility];  Service: Orthopedics;  Laterality: Left;   Social History:  reports that she has never smoked. She has quit using smokeless tobacco. Her smokeless tobacco use included Snuff. She reports that she does not drink alcohol or use illicit drugs.  Allergies  Allergen Reactions  . Codeine     "good" headache  . Other Nausea And Vomiting    Leafy raw vegetables and seeds  . Oxycodone Hcl     "good" headache; also OxyContin  . Peanuts [Peanut Oil] Nausea And Vomiting  . Reglan [Metoclopramide]     CONFUSION    Family History  Problem Relation Age of Onset  . Diabetes    . Heart disease Mother     MI  . Coronary artery disease      Female <55  . Cancer    . Colon cancer Father     age 90     Prior to Admission medications   Medication Sig Start Date End Date Taking?  Authorizing Provider  acetaminophen (TYLENOL) 325 MG tablet Take 1-2 tablets (325-650 mg total) by mouth every 6 (six) hours as needed. Patient taking differently: Take 325-650 mg by mouth every 6 (six) hours as needed for mild pain.  12/06/13  Yes Genelle Gather Babish, PA-C  aspirin 81 MG tablet Take 81 mg by mouth daily.   Yes Historical Provider, MD  atorvastatin (LIPITOR) 40 MG tablet Take 1 tablet (40 mg total) by mouth daily. 09/10/14  Yes Antoine Poche, MD  benazepril (LOTENSIN) 40 MG tablet Take 40 mg by mouth every morning.   Yes Historical Provider, MD  docusate sodium 100 MG CAPS Take 100 mg by mouth 2 (two) times daily. 12/06/13  Yes Genelle GatherMatthew Scott Babish, PA-C  furosemide (LASIX) 20 MG tablet Take 20 mg by mouth daily.   Yes Historical Provider, MD  ketorolac (ACULAR) 0.5 % ophthalmic solution Place 1 drop into the right eye 3 (three) times daily.   Yes Historical Provider, MD  levETIRAcetam (KEPPRA) 500 MG tablet Take 500 mg by mouth 2 (two) times daily.   Yes Historical Provider, MD  magnesium hydroxide (MILK OF MAGNESIA) 400 MG/5ML suspension Take 30 mLs by mouth daily as needed for mild constipation.   Yes Historical Provider, MD  metFORMIN (GLUCOPHAGE) 500 MG tablet Take 500 mg by mouth 2 (two) times daily with a meal.    Yes Historical Provider, MD  metoprolol tartrate (LOPRESSOR) 25 MG tablet Take 25 mg by mouth 2 (two) times daily.    Yes Historical Provider, MD  nitroGLYCERIN (NITROSTAT) 0.4 MG SL tablet Place 1 tablet (0.4 mg total) under the tongue every 5 (five) minutes as needed for chest pain. 05/20/14  Yes Antoine PocheJonathan F Branch, MD  omega-3 acid ethyl esters (LOVAZA) 1 G capsule Take 2 g by mouth 2 (two) times daily.    Yes Historical Provider, MD  oxybutynin (DITROPAN-XL) 10 MG 24 hr tablet Take 10 mg by mouth every morning.    Yes Historical Provider, MD  pantoprazole (PROTONIX) 40 MG tablet Take 1 tablet (40 mg total) by mouth daily. 12/03/13  Yes Nira RetortAnna W Sams, NP    polyethylene glycol (MIRALAX / GLYCOLAX) packet Take 17 g by mouth 2 (two) times daily. Patient taking differently: Take 17 g by mouth daily as needed for mild constipation.  12/06/13  Yes Genelle GatherMatthew Scott Babish, PA-C  potassium chloride (K-DUR,KLOR-CON) 10 MEQ tablet Take 10 mEq by mouth daily.   Yes Historical Provider, MD  traMADol (ULTRAM) 50 MG tablet Take 1-2 tablets (50-100 mg total) by mouth every 6 (six) hours as needed for moderate pain. 12/06/13  Yes Genelle GatherMatthew Scott Babish, PA-C  furosemide (LASIX) 20 MG tablet TAKE 1 TABLET BY MOUTH IN THE MORNING. 10/08/14   Marinus MawGregg W Taylor, MD   Physical Exam: Filed Vitals:   10/09/14 1030 10/09/14 1100 10/09/14 1130 10/09/14 1300  BP: 172/65 165/123 181/78 172/65  Pulse: 66 73 80 86  Temp:    100.1 F (37.8 C)  TempSrc:    Oral  Resp: 23 17 13 20   Height:      Weight:      SpO2: 96% 97% 98% 100%    Wt Readings from Last 3 Encounters:  10/09/14 85.73 kg (189 lb)  02/12/14 85.73 kg (189 lb)  12/27/13 84.369 kg (186 lb)    General:  Appears calm and comfortable Eyes: PERRL, normal lids, irises & conjunctiva ENT: grossly normal hearing, lips & tongue Neck: no LAD, masses or thyromegaly Cardiovascular: RRR, no m/r/g. No LE edema. Telemetry: SR, no arrhythmias  Respiratory: CTA bilaterally, no w/r/r. Normal respiratory effort. Abdomen: soft, Mild epigastric tenderness no rebound or guarding. Skin: no rash or induration seen on limited exam Musculoskeletal: grossly normal tone BUE/BLE Psychiatric: grossly normal mood and affect, speech fluent and appropriate Neurologic: grossly non-focal.          Labs on Admission:  Basic Metabolic Panel:  Recent Labs Lab 10/09/14 0957  NA 142  K 3.5*  CL 100  CO2  24  GLUCOSE 190*  BUN 12  CREATININE 1.23*  CALCIUM 10.9*   Liver Function Tests:  Recent Labs Lab 10/09/14 0957  AST 28  ALT 13  ALKPHOS 89  BILITOT 0.7  PROT 9.1*  ALBUMIN 4.2    Recent Labs Lab 10/09/14 0957   LIPASE 34   No results for input(s): AMMONIA in the last 168 hours. CBC:  Recent Labs Lab 10/09/14 0957  WBC 9.6  NEUTROABS 8.4*  HGB 11.5*  HCT 36.0  MCV 81.3  PLT 261   Cardiac Enzymes: No results for input(s): CKTOTAL, CKMB, CKMBINDEX, TROPONINI in the last 168 hours.  BNP (last 3 results) No results for input(s): PROBNP in the last 8760 hours. CBG: No results for input(s): GLUCAP in the last 168 hours.  Radiological Exams on Admission: Dg Chest 2 View  10/09/2014   CLINICAL DATA:  Lower abdominal pain, nausea, vomiting and fever.  EXAM: CHEST  2 VIEW  COMPARISON:  Portable chest dated 09/30/2013.  FINDINGS: Enlarged cardiac silhouette with improvement. 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 Clear lungs with normal vascularity. Thoracic spine degenerative changes.  IMPRESSION: No acute abnormality.   Electronically Signed   By: Gordan Payment M.D.   On: 10/09/2014 12:01   Ct Abdomen Pelvis W Contrast  10/09/2014   CLINICAL DATA:  Nausea, vomiting, fever and lower abdomen pain starting this morning.  EXAM: CT ABDOMEN AND PELVIS WITH CONTRAST  TECHNIQUE: Multidetector CT imaging of the abdomen and pelvis was performed using the standard protocol following bolus administration of intravenous contrast.  CONTRAST:  OMNIPAQUE IOHEXOL 300 MG/ML SOLN, 50mL OMNIPAQUE IOHEXOL 300 MG/ML SOLN  COMPARISON:  June 29, 2013  FINDINGS: There is mild diffuse fatty infiltration of liver. There is mild intrahepatic biliary ductal dilatation, postsurgical. The patient is status post prior cholecystectomy. The spleen, pancreas, adrenal glands and kidneys are normal. There is no hydronephrosis bilaterally. There is atherosclerosis of the abdominal aorta without aneurysmal dilatation. There is no abdominal lymphadenopathy. There is small umbilical herniation of mesenteric fat unchanged.  There is a small hiatal hernia. There is no small bowel obstruction. Marked bowel content is  identified throughout colon. The appendix is not seen but no inflammation is noted around cecum.  Fluid-filled bladder is identified with a small focus air identified within the bladder lumen, question recent instrumentation. Clinical correlation is recommended. Evaluation of the lower pelvis is limited due to streak artifact from bilateral hip replacements. There is mild atelectasis of the posterior lungs. The heart size is enlarged. Patient is status post prior posterior fusion of the lumbar spine unchanged. Degenerative joint changes of the spine are noted.  IMPRESSION: No acute abnormality identified in the abdomen and pelvis. No evidence of bowel obstruction.  Constipation.   Electronically Signed   By: Sherian Rein M.D.   On: 10/09/2014 11:17    EKG: Independently reviewed.sinus rhythm normal axis nonspecific T-wave changes  Assessment/Plan Intractable nausea and vomiting/ Epigastric abdominal pain: - 24 hours of nausea and vomiting she got IV Zofran in the ED with no improvement I will go ahead and continue Zofran at [facility] and check it EKG to measure QTC in the morning. Her LFTs are within normal limits, lipase within normal she has some mild epigastric pain CT scan of the abdomen and pelvis is unremarkable, her UA shows 3-6 white blood cells negative for nitrates. - Lungs are clear check an influenza PCR stat Tamiflu empirically, Negative can be stopping the morning. -  She still has her gallbladder with normal liver function tests, unlikely pancreatitis was started empirically on Protonix her hemoglobin is stable compared to previous maybe mildly elevated due to some hemoconcentration,distance no infiltrates on chest x-ray she doesn't have a white count, will check her for HIV, we'll cycle cardiac enzymes 3. Unlikely appendicitis kidney stones or mesenteric ischemia. - Check an LDH.check orthostatics. - start her on normal saline at 75 an hour for 12 hours check a basic metabolic panel in  the morning.  Chronic diastolic heart failure: - seems to be Euvolemic, start her on normal saline status will I will put her nothing by mouth due to her nausea and vomiting. - hold Lasix an ace inhibitor  Anemia, normocytic normochromic: - Her RDW is high, I think there is a degree of hemoconcentration. We'll recheck tomorrow morning.  Diabetes mellitus, type II: - Hold metformin, start her on sliding scale insulin.  Hypertension: - hold Lasix and ACE inhibitor continue metoprolol.  - Hydralazine when necessary.  Code Status: full DVT Prophylaxis: heparin Family Communication: daughter Disposition Plan: inpatient  Time spent: 80 minutes  Marinda Elk [facility] Pager [phone]

## 2014-10-09 NOTE — ED Provider Notes (Signed)
CSN: [id number]     Arrival date & time 10/09/14  0908 History   First MD Initiated Contact with Patient 10/09/14 [phone]     Chief Complaint  Patient presents with  . Abdominal Pain  . Fever   Patient confused-does not know date or year. History from daughter  (Consider location/radiation/quality/duration/timing/severity/associated sxs/prior Treatment) HPI [name] with confusion began Monday evening. Daughter states it seemed to come and go- no lateralized deficits, would just say something "off the wall".  Daughter called Dr. Juanetta Gosling' office and ordered op urine which was done op yesterday.  Daughter states today nausea and vomiting.  Last po was yesterday.  Patient ambulatory with cane.  Cough started today here.   Past Medical History  Diagnosis Date  . Hypertension   . CAD (coronary artery disease)     multivessel, CABG 6/10.  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  Myoview (11/20/11) showed no ischemia & EF 62%  . Hyperlipidemia   . Deep vein thrombosis, upper right extremity     post-PPM  . Obesity   . Dehiscence of closure of sternum or sternotomy 07/2009    sternal infection; required flap closure  . COPD (chronic obstructive pulmonary disease)     severe lung disease by PFTs 6/12  . Seizure disorder   . 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 PUD (peptic ulcer disease)   . Pacemaker   . Sleep apnea     denies CPAP use on 09/30/2013  . Diabetes mellitus, type II   . H/O hiatal hernia   . Migraines     "years ago" (09/30/2013)  . DDD (degenerative disc disease)   . Arthritis     "legs and back" (09/30/2013)  . Chronic lower back pain   . Urge incontinence of urine   . Myocardial infarction     years ago  . Anginal pain     "all the time, but not heart related"  . Asthma     years ago  . Orthopnea     sometimes  . 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 History of kidney stones   . Gastroesophageal reflux disease      not much  . Seizures     "long time ago; don't remember what they were related to" (09/30/2013)none recent  . Anemia   . Blurred vision, bilateral     sometimes double vision   Past Surgical History  Procedure Laterality Date  . Pacemaker placement  1993    Status post DDD pacemaker implantation in 1993, with Medtronic Kappa generator change in 2003 for  second-degree AV block, most recent gen change by Fawn Kirk 04/14/11  . Total hip arthroplasty Right   . Replacement total knee Bilateral   . Thyroidectomy    . Wrist surgery Left     "tumor taken off" (09/30/2013)  . Coronary artery bypass graft  05/1999    - LIMA to LAD, SVG to OM, SVG to PDA  . Reconstructive repair sternal      for infection s/p CABG 8/10  . Esophagogastroduodenoscopy      with [facility] dilation,  biopsy, and disruption Schatzki's ring  . Colonoscopy  04/27/2005    hemorrhoids, diverticula  . Esophagogastroduodenoscopy  09/10/2012    RMR: Noncritical Schatzki's ring. Diffuse gastric erosions and an  area of partially healing ulceration-status post biopsy/ Small hiatal hernia. Bx reactive gastropathy.  . Colonoscopy with  esophagogastroduodenoscopy (egd) N/A 02/20/2013    RMR: pancolonic diverticulosis and internal hemorrhoids. EGD with small hiatal hernia, healed gastric ulcer  . Portacath placement Left 07/17/2013    Procedure: INSERTION PORT-A-CATH-left subclavian;  Surgeon: Fabio BeringBrent C Ziegler, MD;  Location: [facility];  Service: General;  Laterality: Left;  . Appendectomy    . Cholecystectomy    . Insert / replace / remove pacemaker  1993  . Lumbar disc surgery    . Posterior lumbar fusion    . Tubal ligation    . Cesarean section  ~1980  . Abdominal hysterectomy      partial  . Back surgery      x3 total  . Total hip arthroplasty Left 12/03/2013    Procedure: LEFT TOTAL HIP ARTHROPLASTY ANTERIOR APPROACH;  Surgeon: Shelda PalMatthew D Olin, MD;  Location: [facility];  Service: Orthopedics;  Laterality: Left;   Family History   Problem Relation Age of Onset  . Diabetes    . Heart disease Mother     MI  . Coronary artery disease      Female <55  . Cancer    . Colon cancer Father     age 75   History  Substance Use Topics  . Smoking status: Never Smoker   . Smokeless tobacco: Former NeurosurgeonUser    Types: Snuff     Comment: 09/30/2013 "stopped snuff a long time ago"  . Alcohol Use: No   OB History    No data available     Review of Systems  All other systems reviewed and are negative.     Allergies  Codeine; Other; Oxycodone hcl; Peanuts; and Reglan  Home Medications   Prior to Admission medications   Medication Sig Start Date End Date Taking? Authorizing Provider  acetaminophen (TYLENOL) 325 MG tablet Take 1-2 tablets (325-650 mg total) by mouth every 6 (six) hours as needed. 12/06/13   Genelle GatherMatthew Scott Babish, PA-C  aspirin 81 MG tablet Take 81 mg by mouth daily.    Historical Provider, MD  atorvastatin (LIPITOR) 40 MG tablet Take 1 tablet (40 mg total) by mouth daily. 09/10/14   Antoine PocheJonathan F Branch, MD  benazepril (LOTENSIN) 40 MG tablet Take 40 mg by mouth every morning.    Historical Provider, MD  docusate sodium 100 MG CAPS Take 100 mg by mouth 2 (two) times daily. 12/06/13   Genelle GatherMatthew Scott Babish, PA-C  furosemide (LASIX) 20 MG tablet TAKE 1 TABLET BY MOUTH IN THE MORNING. 10/08/14   Marinus MawGregg W Taylor, MD  ketorolac (ACULAR) 0.5 % ophthalmic solution Place 1 drop into the right eye 3 (three) times daily.    Historical Provider, MD  levETIRAcetam (KEPPRA) 500 MG tablet Take 500 mg by mouth 2 (two) times daily.    Historical Provider, MD  metFORMIN (GLUCOPHAGE) 500 MG tablet Take 500 mg by mouth 2 (two) times daily with a meal.     Historical Provider, MD  metoprolol tartrate (LOPRESSOR) 25 MG tablet Take 25 mg by mouth 2 (two) times daily.     Historical Provider, MD  nitroGLYCERIN (NITROSTAT) 0.4 MG SL tablet Place 1 tablet (0.4 mg total) under the tongue every 5 (five) minutes as needed for chest pain. 05/20/14    Antoine PocheJonathan F Branch, MD  omega-3 acid ethyl esters (LOVAZA) 1 G capsule Take 1 g by mouth daily.    Historical Provider, MD  oxybutynin (DITROPAN-XL) 10 MG 24 hr tablet Take 10 mg by mouth every morning.     Historical Provider,  MD  pantoprazole (PROTONIX) 40 MG tablet Take 1 tablet (40 mg total) by mouth daily. 12/03/13   Nira RetortAnna W Sams, NP  polyethylene glycol [facility](MIRALAX / Ethelene HalGLYCOLAX) packet Take 17 g by mouth 2 (two) times daily. 12/06/13   Genelle GatherMatthew Scott Babish, PA-C  potassium chloride (K-DUR,KLOR-CON) 10 MEQ tablet Take 10 mEq by mouth daily.    Historical Provider, MD  traMADol (ULTRAM) 50 MG tablet Take 1-2 tablets (50-100 mg total) by mouth every 6 (six) hours as needed for moderate pain. 12/06/13   Genelle GatherMatthew Scott Babish, PA-C   BP 180/90 mmHg  Pulse 71  Temp(Src) 100 F (37.8 C) (Oral)  Resp 24  Ht 5\' 2"  (1.575 m)  Wt 189 lb (85.73 kg)  BMI 34.56 kg/m2  SpO2 97% Physical Exam  Constitutional: She appears well-developed and well-nourished.  HENT:  Head: Normocephalic.  Right Ear: External ear normal.  Left Ear: External ear normal.  Nose: Nose normal.  Mouth/Throat: Oropharynx is clear and moist.  Eyes: Conjunctivae and EOM are normal. Pupils are equal, round, and reactive to light.  Neck: Normal range of motion. Neck supple. No thyromegaly present.  Cardiovascular: Normal rate and normal heart sounds.   Pulmonary/Chest: Effort normal and breath sounds normal.  Abdominal: Soft. There is tenderness.  Diffuse ttp  Musculoskeletal: Normal range of motion. She exhibits no edema.  Neurological: She is alert.  Skin: Skin is warm and dry.  Psychiatric: She has a normal mood and affect.  Nursing note and vitals reviewed.   ED Course  Procedures (including critical care time) Labs Review Labs Reviewed  CBC WITH DIFFERENTIAL - Abnormal; Notable for the following:    Hemoglobin 11.5 (*)    RDW 15.8 (*)    Neutrophils Relative % 87 (*)    Neutro Abs 8.4 (*)    Lymphocytes Relative 8 (*)     All other components within normal limits  COMPREHENSIVE METABOLIC PANEL - Abnormal; Notable for the following:    Potassium 3.5 (*)    Glucose, Bld 190 (*)    Creatinine, Ser 1.23 (*)    Calcium 10.9 (*)    Total Protein 9.1 (*)    GFR calc non Af Amer 42 (*)    GFR calc Af Amer 48 (*)    Anion gap 18 (*)    All other components within normal limits  URINALYSIS, ROUTINE W REFLEX MICROSCOPIC - Abnormal; Notable for the following:    Glucose, UA 100 (*)    Hgb urine dipstick SMALL (*)    Ketones, ur TRACE (*)    Protein, ur 100 (*)    All other components within normal limits  CULTURE, BLOOD (ROUTINE X 2)  CULTURE, BLOOD (ROUTINE X 2)  LIPASE, BLOOD  PROTIME-INR  LACTIC ACID, PLASMA  URINE MICROSCOPIC-ADD ON  OCCULT BLOOD X 1 CARD TO LAB, STOOL  POC OCCULT BLOOD, ED    Imaging Review Dg Chest 2 View  10/09/2014   CLINICAL DATA:  Lower abdominal pain, nausea, vomiting and fever.  EXAM: CHEST  2 VIEW  COMPARISON:  Portable chest dated 09/30/2013.  FINDINGS: Enlarged cardiac silhouette with improvement. 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 Clear lungs with normal vascularity. Thoracic spine degenerative changes.  IMPRESSION: No acute abnormality.   Electronically Signed   By: Gordan PaymentSteve  Reid M.D.   On: 10/09/2014 12:01   Ct Abdomen Pelvis W Contrast  10/09/2014   CLINICAL DATA:  Nausea, vomiting, fever and lower abdomen pain starting this morning.  EXAM: CT ABDOMEN AND PELVIS WITH CONTRAST  TECHNIQUE: Multidetector CT imaging of the abdomen and pelvis was performed using the standard protocol following bolus administration of intravenous contrast.  CONTRAST:  OMNIPAQUE IOHEXOL 300 MG/ML SOLN, 50mL OMNIPAQUE IOHEXOL 300 MG/ML SOLN  COMPARISON:  June 29, 2013  FINDINGS: There is mild diffuse fatty infiltration of liver. There is mild intrahepatic biliary ductal dilatation, postsurgical. The patient is status post prior cholecystectomy. The spleen, pancreas,  adrenal glands and kidneys are normal. There is no hydronephrosis bilaterally. There is atherosclerosis of the abdominal aorta without aneurysmal dilatation. There is no abdominal lymphadenopathy. There is small umbilical herniation of mesenteric fat unchanged.  There is a small hiatal hernia. There is no small bowel obstruction. Marked bowel content is identified throughout colon. The appendix is not seen but no inflammation is noted around cecum.  Fluid-filled bladder is identified with a small focus air identified within the bladder lumen, question recent instrumentation. Clinical correlation is recommended. Evaluation of the lower pelvis is limited due to streak artifact from bilateral hip replacements. There is mild atelectasis of the posterior lungs. The heart size is enlarged. Patient is status post prior posterior fusion of the lumbar spine unchanged. Degenerative joint changes of the spine are noted.  IMPRESSION: No acute abnormality identified in the abdomen and pelvis. No evidence of bowel obstruction.  Constipation.   Electronically Signed   By: Sherian Rein M.D.   On: 10/09/2014 11:17     EKG Interpretation   Date/Time:  Thursday October 09 2014 10:09:30 EST Ventricular Rate:  67 PR Interval:  126 QRS Duration: 111 QT Interval:  386 QTC Calculation: 407 R Axis:   -37 Text Interpretation:  Normal sinus rhythm T wave abnormality No  significant change since last tracing Confirmed by Hasnain Manheim MD, Duwayne Heck  ([id number]) on 10/09/2014 12:57:57 PM      MDM   Final diagnoses:  Intractable vomiting with nausea, vomiting of unspecified type  Fever, unspecified fever cause    [name] with fever, nausea, and vomiting without diarrhea. No known sick contacts.  Here she has some cough with no infiltrate on cxr.   Patient with some mild confusion from baseline.  Labs c.w. Hyperglycemia and elevated anion gap without acidosis.  Patient continues to vomit here after iv fluids and antiemetics but  remains hemodynamically stable. Plan admission for ongoing treatment and evaluation.     Hilario Quarry, MD 10/09/14 [phone]

## 2014-10-09 NOTE — Progress Notes (Signed)
Pt MRSA PCR was positive Order set initiated

## 2014-10-09 NOTE — ED Notes (Signed)
Port accessed, left chest, using sterile technique. Patient tolerated well.

## 2014-10-09 NOTE — ED Notes (Signed)
Patient c/o lower abd pain with nausea and vomiting with fever that started this morning. Denies any diarrhea or urinary symptoms. Per family patient a little confused Monday and Tuesday [facility] to check for UTI. Per family results have not been given yet. Patient unsure of when her last BM was.

## 2014-10-09 NOTE — ED Notes (Signed)
Report given to Lisa on Dept 300. All questions answered.  

## 2014-10-10 DIAGNOSIS — E1165 Type 2 diabetes mellitus with hyperglycemia: Secondary | ICD-10-CM | POA: Diagnosis present

## 2014-10-10 DIAGNOSIS — I25810 Atherosclerosis of coronary artery bypass graft(s) without angina pectoris: Secondary | ICD-10-CM | POA: Diagnosis present

## 2014-10-10 DIAGNOSIS — E860 Dehydration: Secondary | ICD-10-CM | POA: Diagnosis present

## 2014-10-10 DIAGNOSIS — K5641 Fecal impaction: Secondary | ICD-10-CM | POA: Diagnosis present

## 2014-10-10 DIAGNOSIS — G40909 Epilepsy, unspecified, not intractable, without status epilepticus: Secondary | ICD-10-CM | POA: Diagnosis present

## 2014-10-10 DIAGNOSIS — F17220 Nicotine dependence, chewing tobacco, uncomplicated: Secondary | ICD-10-CM | POA: Diagnosis present

## 2014-10-10 DIAGNOSIS — J45909 Unspecified asthma, uncomplicated: Secondary | ICD-10-CM | POA: Diagnosis present

## 2014-10-10 DIAGNOSIS — D649 Anemia, unspecified: Secondary | ICD-10-CM | POA: Diagnosis present

## 2014-10-10 DIAGNOSIS — I5032 Chronic diastolic (congestive) heart failure: Secondary | ICD-10-CM | POA: Diagnosis present

## 2014-10-10 DIAGNOSIS — K219 Gastro-esophageal reflux disease without esophagitis: Secondary | ICD-10-CM | POA: Diagnosis present

## 2014-10-10 DIAGNOSIS — Z8673 Personal history of transient ischemic attack (TIA), and cerebral infarction without residual deficits: Secondary | ICD-10-CM | POA: Diagnosis not present

## 2014-10-10 DIAGNOSIS — E785 Hyperlipidemia, unspecified: Secondary | ICD-10-CM | POA: Diagnosis present

## 2014-10-10 DIAGNOSIS — G4730 Sleep apnea, unspecified: Secondary | ICD-10-CM | POA: Diagnosis present

## 2014-10-10 DIAGNOSIS — G9341 Metabolic encephalopathy: Secondary | ICD-10-CM | POA: Diagnosis present

## 2014-10-10 DIAGNOSIS — I10 Essential (primary) hypertension: Secondary | ICD-10-CM | POA: Diagnosis present

## 2014-10-10 DIAGNOSIS — Z833 Family history of diabetes mellitus: Secondary | ICD-10-CM | POA: Diagnosis not present

## 2014-10-10 DIAGNOSIS — I252 Old myocardial infarction: Secondary | ICD-10-CM | POA: Diagnosis not present

## 2014-10-10 DIAGNOSIS — Z96641 Presence of right artificial hip joint: Secondary | ICD-10-CM | POA: Diagnosis present

## 2014-10-10 DIAGNOSIS — Z8249 Family history of ischemic heart disease and other diseases of the circulatory system: Secondary | ICD-10-CM | POA: Diagnosis not present

## 2014-10-10 DIAGNOSIS — Z79899 Other long term (current) drug therapy: Secondary | ICD-10-CM | POA: Diagnosis not present

## 2014-10-10 DIAGNOSIS — R1084 Generalized abdominal pain: Secondary | ICD-10-CM | POA: Diagnosis present

## 2014-10-10 DIAGNOSIS — Z951 Presence of aortocoronary bypass graft: Secondary | ICD-10-CM | POA: Diagnosis not present

## 2014-10-10 DIAGNOSIS — Z950 Presence of cardiac pacemaker: Secondary | ICD-10-CM | POA: Diagnosis not present

## 2014-10-10 DIAGNOSIS — Z800 Family history of malignant neoplasm of digestive organs: Secondary | ICD-10-CM | POA: Diagnosis not present

## 2014-10-10 DIAGNOSIS — Z7982 Long term (current) use of aspirin: Secondary | ICD-10-CM | POA: Diagnosis not present

## 2014-10-10 DIAGNOSIS — J449 Chronic obstructive pulmonary disease, unspecified: Secondary | ICD-10-CM | POA: Diagnosis present

## 2014-10-10 LAB — GLUCOSE, CAPILLARY
GLUCOSE-CAPILLARY: 139 mg/dL — AB (ref 70–99)
GLUCOSE-CAPILLARY: 123 mg/dL — AB (ref 70–99)
Glucose-Capillary: 134 mg/dL — ABNORMAL HIGH (ref 70–99)
Glucose-Capillary: 103 mg/dL — ABNORMAL HIGH (ref 70–99)

## 2014-10-10 LAB — CBC
HEMATOCRIT: 31.7 % — AB (ref 36.0–46.0)
HEMOGLOBIN: 10.1 g/dL — AB (ref 12.0–15.0)
MCH: 25.8 pg — AB (ref 26.0–34.0)
MCHC: 31.9 g/dL (ref 30.0–36.0)
MCV: 81.1 fL (ref 78.0–100.0)
Platelets: 215 10*3/uL (ref 150–400)
RBC: 3.91 MIL/uL (ref 3.87–5.11)
RDW: 16 % — ABNORMAL HIGH (ref 11.5–15.5)
WBC: 11.7 10*3/uL — ABNORMAL HIGH (ref 4.0–10.5)

## 2014-10-10 LAB — BASIC METABOLIC PANEL
Anion gap: 14 (ref 5–15)
BUN: 14 mg/dL (ref 6–23)
CO2: 23 meq/L (ref 19–32)
CREATININE: 1.29 mg/dL — AB (ref 0.50–1.10)
Calcium: 8.9 mg/dL (ref 8.4–10.5)
Chloride: 103 mEq/L (ref 96–112)
GFR calc Af Amer: 46 mL/min — ABNORMAL LOW (ref >90)
GFR calc non Af Amer: 39 mL/min — ABNORMAL LOW (ref >90)
Glucose, Bld: 125 mg/dL — ABNORMAL HIGH (ref 70–99)
Potassium: 3.1 mEq/L — ABNORMAL LOW (ref 3.7–5.3)
Sodium: 140 mEq/L (ref 137–147)

## 2014-10-10 LAB — TROPONIN I: Troponin I: <0.3 ng/mL (ref <0.30)

## 2014-10-10 LAB — HEMOGLOBIN A1C
HEMOGLOBIN A1C: 6.2 % — AB (ref <5.7)
MEAN PLASMA GLUCOSE: 131 mg/dL — AB (ref <117)

## 2014-10-10 MED ORDER — POTASSIUM CHLORIDE CRYS ER 20 MEQ PO TBCR
20.0000 meq | EXTENDED_RELEASE_TABLET | Freq: Two times a day (BID) | ORAL | Status: DC
  Administered 2014-10-10 – 2014-10-12 (×5): 20 meq via ORAL
  Filled 2014-10-10 (×6): qty 1

## 2014-10-10 MED ORDER — MILK AND MOLASSES ENEMA
1.0000 | Freq: Once | RECTAL | Status: AC
  Administered 2014-10-10: 250 mL via RECTAL

## 2014-10-10 MED ORDER — CEFTRIAXONE SODIUM 1 G IJ SOLR
1.0000 g | INTRAMUSCULAR | Status: DC
  Administered 2014-10-10 – 2014-10-12 (×3): 1 g via INTRAVENOUS
  Filled 2014-10-10 (×4): qty 10

## 2014-10-10 MED ORDER — SODIUM CHLORIDE 0.9 % IV SOLN: INTRAVENOUS | Status: AC

## 2014-10-10 NOTE — Care Management Note (Addendum)
    Page 1 of 1   10/10/2014     2:31:58 PM CARE MANAGEMENT NOTE 10/10/2014  Patient:  Henricksen,Anette P   Account Number:  [id number]  Date Initiated:  10/10/2014  Documentation initiated by:  Sharrie RothmanBLACKWELL,Prachi Oftedahl C  Subjective/Objective Assessment:   Pt admitted from home with vomiting from constipation. Pt lives with family and will return home at discharge. Pt stated that she uses a cane for home use. [facility]     Action/Plan:   Will arrange resumption of HH at discharge. Anticipate discharge over the weekend.   Anticipated DC Date:  10/11/2014   Anticipated DC Plan:  HOME W HOME HEALTH SERVICES      DC Planning Services  CM consult      Choice offered to / List presented to:             Status of service:  Completed, signed off Medicare Important Message given?   (If response is "NO", the following Medicare IM given date fields will be blank) Date Medicare IM given:   Medicare IM given by:   Date Additional Medicare IM given:   Additional Medicare IM given by:    Discharge Disposition:  HOME W HOME HEALTH SERVICES  Per UR Regulation:    If discussed at Long Length of Stay Meetings, dates discussed:    Comments:  10/10/14 1430 Arlyss Queenammy Rosamae Rocque, RN BSN CM [facility] not [facility]. Tried to call pts daughter but unable to leave vm. Weekend staff to talk with daughter to [facility] and contact them at discharge.  10/10/14 1310 Arlyss Queenammy Deronte Solis, RN BSN CM

## 2014-10-10 NOTE — Progress Notes (Signed)
Notified Dr. Juanetta GoslingHawkins of the patients results from her MOM enema.  Voiced to him that the patient had a large amount of hard stool lodged in her rectum.  The patient was disimpacted as well. The patient tolerated the procedure well, pain was noted with the impaction but she stated she felt relief after.

## 2014-10-10 NOTE — Progress Notes (Signed)
Nutrition Brief Note  Patient identified on the Malnutrition Screening Tool (MST) Report  Pt admitted with nausea and vomiting on admission. Found to be impacted. BM-11/6.    Wt Readings from Last 15 Encounters:  10/09/14 178 lb 12.7 oz (81.1 kg)  02/12/14 189 lb (85.73 kg)  12/27/13 186 lb (84.369 kg)  12/03/13 184 lb (83.462 kg)  11/21/13 184 lb (83.462 kg)  10/30/13 184 lb (83.462 kg)  09/30/13 179 lb 11.2 oz (81.511 kg)  07/25/13 197 lb 14.4 oz (89.767 kg)  07/18/13 208 lb 15.9 oz (94.8 kg)  07/03/13 192 lb 3.9 oz (87.2 kg)  05/22/13 194 lb (87.998 kg)  03/03/13 194 lb 3.6 oz (88.1 kg)  02/20/13 202 lb (91.627 kg)  02/14/13 202 lb (91.627 kg)  02/13/13 204 lb 3.2 oz (92.625 kg)    Body mass index is 32.69 kg/(m^2). Patient meets criteria for obesity class I based on current BMI. Weight loss since March 5% .  Diet advanced to CHO Modified meals. Dietary staff reports pt ate breakfast. Labs and medications reviewed.   No nutrition interventions warranted at this time. If nutrition issues arise, please consult RD.   Royann ShiversLynn Jebidiah Baggerly MS,RD,CSG,LDN Office: [phone] Pager: [phone]

## 2014-10-10 NOTE — Plan of Care (Signed)
Problem: Phase II Progression Outcomes Goal: Progress activity as tolerated unless otherwise ordered Outcome: Progressing Goal: Discharge plan established Outcome: Progressing Goal: Vital signs remain stable Outcome: Progressing Goal: IV changed to normal saline lock Outcome: Progressing Goal: Obtain order to discontinue catheter if appropriate Outcome: Progressing Goal: Other Phase II Outcomes/Goals Outcome: Completed/Met Date Met:  10/10/14

## 2014-10-10 NOTE — Progress Notes (Signed)
Subjective: She was admitted yesterday with confusion and nausea. She is better. She is still nauseated. She also appears to be impacted.  Objective: Vital signs in last 24 hours: Temp:  [98.8 F (37.1 C)-100.7 F (38.2 C)] 98.9 F (37.2 C) (11/06 0439) Pulse Rate:  [58-86] 66 (11/06 0439) Resp:  [13-24] 16 (11/06 0439) BP: (142-189)/(65-123) 142/77 mmHg (11/06 0439) SpO2:  [94 %-100 %] 98 % (11/06 0439) Weight:  [81.1 kg (178 lb 12.7 oz)-85.73 kg (189 lb)] 81.1 kg (178 lb 12.7 oz) (11/05 1428) Weight change:  Last BM Date: 10/09/14  Intake/Output from previous day: 11/05 0701 - 11/06 0700 In: 1074 [I.V.:1074] Out: 1050 [Urine:1050]  PHYSICAL EXAM General appearance: alert, cooperative, mild distress and morbidly obese Resp: clear to auscultation bilaterally Cardio: regular rate and rhythm, S1, S2 normal, no murmur, click, rub or gallop GI: soft, non-tender; bowel sounds normal; no masses,  no organomegaly Extremities: extremities normal, atraumatic, no cyanosis or edema  Lab Results:  Results for orders placed or performed during the hospital encounter of 10/09/14 (from the past 48 hour(s))  CBC with Differential     Status: Abnormal   Collection Time: 10/09/14  9:57 AM  Result Value Ref Range   WBC 9.6 4.0 - 10.5 K/uL   RBC 4.43 3.87 - 5.11 MIL/uL   Hemoglobin 11.5 (L) 12.0 - 15.0 g/dL   HCT 36.0 36.0 - 46.0 %   MCV 81.3 78.0 - 100.0 fL   MCH 26.0 26.0 - 34.0 pg   MCHC 31.9 30.0 - 36.0 g/dL   RDW 15.8 (H) 11.5 - 15.5 %   Platelets 261 150 - 400 K/uL   Neutrophils Relative % 87 (H) 43 - 77 %   Neutro Abs 8.4 (H) 1.7 - 7.7 K/uL   Lymphocytes Relative 8 (L) 12 - 46 %   Lymphs Abs 0.8 0.7 - 4.0 K/uL   Monocytes Relative 5 3 - 12 %   Monocytes Absolute 0.4 0.1 - 1.0 K/uL   Eosinophils Relative 0 0 - 5 %   Eosinophils Absolute 0.0 0.0 - 0.7 K/uL   Basophils Relative 0 0 - 1 %   Basophils Absolute 0.0 0.0 - 0.1 K/uL  Comprehensive metabolic panel     Status: Abnormal    Collection Time: 10/09/14  9:57 AM  Result Value Ref Range   Sodium 142 137 - 147 mEq/L   Potassium 3.5 (L) 3.7 - 5.3 mEq/L   Chloride 100 96 - 112 mEq/L   CO2 24 19 - 32 mEq/L   Glucose, Bld 190 (H) 70 - 99 mg/dL   BUN 12 6 - 23 mg/dL   Creatinine, Ser 1.23 (H) 0.50 - 1.10 mg/dL   Calcium 10.9 (H) 8.4 - 10.5 mg/dL   Total Protein 9.1 (H) 6.0 - 8.3 g/dL   Albumin 4.2 3.5 - 5.2 g/dL   AST 28 0 - 37 U/L   ALT 13 0 - 35 U/L   Alkaline Phosphatase 89 39 - 117 U/L   Total Bilirubin 0.7 0.3 - 1.2 mg/dL   GFR calc non Af Amer 42 (L) >90 mL/min   GFR calc Af Amer 48 (L) >90 mL/min    Comment: (NOTE) The eGFR has been calculated using the CKD EPI equation. This calculation has not been validated in all clinical situations. eGFR's persistently <90 mL/min signify possible Chronic Kidney Disease.    Anion gap 18 (H) 5 - 15  Lipase, blood     Status: None   Collection  Time: 10/09/14  9:57 AM  Result Value Ref Range   Lipase 34 11 - 59 U/L  Protime-INR     Status: None   Collection Time: 10/09/14 10:10 AM  Result Value Ref Range   Prothrombin Time 14.5 11.6 - 15.2 seconds   INR 1.12 0.00 - 1.49  Lactic acid, plasma     Status: None   Collection Time: 10/09/14 10:10 AM  Result Value Ref Range   Lactic Acid, Venous 1.6 0.5 - 2.2 mmol/L  Urinalysis, Routine w reflex microscopic     Status: Abnormal   Collection Time: 10/09/14 10:11 AM  Result Value Ref Range   Color, Urine YELLOW YELLOW   APPearance CLEAR CLEAR   Specific Gravity, Urine 1.015 1.005 - 1.030   pH 7.0 5.0 - 8.0   Glucose, UA 100 (A) NEGATIVE mg/dL   Hgb urine dipstick SMALL (A) NEGATIVE   Bilirubin Urine NEGATIVE NEGATIVE   Ketones, ur TRACE (A) NEGATIVE mg/dL   Protein, ur 658 (A) NEGATIVE mg/dL   Urobilinogen, UA 0.2 0.0 - 1.0 mg/dL   Nitrite NEGATIVE NEGATIVE   Leukocytes, UA NEGATIVE NEGATIVE  Urine microscopic-add on     Status: None   Collection Time: 10/09/14 10:11 AM  Result Value Ref Range   WBC, UA  3-6 <3 WBC/hpf   RBC / HPF 3-6 <3 RBC/hpf  POC occult blood, ED     Status: None   Collection Time: 10/09/14 10:15 AM  Result Value Ref Range   Fecal Occult Bld NEGATIVE NEGATIVE  Influenza panel by pcr     Status: None   Collection Time: 10/09/14  1:15 PM  Result Value Ref Range   Influenza A By PCR NEGATIVE NEGATIVE   Influenza B By PCR NEGATIVE NEGATIVE   H1N1 flu by pcr NOT DETECTED NOT DETECTED    Comment:        The Xpert Flu assay (FDA approved for nasal aspirates or washes and nasopharyngeal swab specimens), is intended as an aid in the diagnosis of influenza and should not be used as a sole basis for treatment.   MRSA PCR Screening     Status: Abnormal   Collection Time: 10/09/14  3:00 PM  Result Value Ref Range   MRSA by PCR POSITIVE (A) NEGATIVE    Comment:        The GeneXpert MRSA Assay (FDA approved for NASAL specimens only), is one component of a comprehensive MRSA colonization surveillance program. It is not intended to diagnose MRSA infection nor to guide or monitor treatment for MRSA infections. RESULT CALLED TO, READ BACK BY AND VERIFIED WITH: MORRIS,C. AT 1915 ON 10/09/2014 BY BAUGHAM,M.   Hemoglobin A1c     Status: Abnormal   Collection Time: 10/09/14  3:13 PM  Result Value Ref Range   Hgb A1c MFr Bld 6.2 (H) <5.7 %    Comment: (NOTE)                                                                       According to the ADA Clinical Practice Recommendations for 2011, when HbA1c is used as a screening test:  >=6.5%   Diagnostic of Diabetes Mellitus           (if  abnormal result is confirmed) 5.7-6.4%   Increased risk of developing Diabetes Mellitus References:Diagnosis and Classification of Diabetes Mellitus,Diabetes ZOXW,9604,54(UJWJX 1):S62-S69 and Standards of Medical Care in         Diabetes - 2011,Diabetes BJYN,8295,62 (Suppl 1):S11-S61.    Mean Plasma Glucose 131 (H) <117 mg/dL    Comment: Performed at [facility]  Troponin I (q  6hr x 3)     Status: None   Collection Time: 10/09/14  3:13 PM  Result Value Ref Range   Troponin I <0.30 <0.30 ng/mL    Comment:        Due to the release kinetics of cTnI, a negative result within the first hours of the onset of symptoms does not rule out myocardial infarction with certainty. If myocardial infarction is still suspected, repeat the test at appropriate intervals.   Glucose, capillary     Status: Abnormal   Collection Time: 10/09/14  4:21 PM  Result Value Ref Range   Glucose-Capillary 134 (H) 70 - 99 mg/dL   Comment 1 Notify RN   Troponin I (q 6hr x 3)     Status: None   Collection Time: 10/09/14  8:29 PM  Result Value Ref Range   Troponin I <0.30 <0.30 ng/mL    Comment:        Due to the release kinetics of cTnI, a negative result within the first hours of the onset of symptoms does not rule out myocardial infarction with certainty. If myocardial infarction is still suspected, repeat the test at appropriate intervals.   Glucose, capillary     Status: Abnormal   Collection Time: 10/09/14  8:58 PM  Result Value Ref Range   Glucose-Capillary 149 (H) 70 - 99 mg/dL   Comment 1 Notify RN   Troponin I (q 6hr x 3)     Status: None   Collection Time: 10/10/14  2:57 AM  Result Value Ref Range   Troponin I <0.30 <0.30 ng/mL    Comment:        Due to the release kinetics of cTnI, a negative result within the first hours of the onset of symptoms does not rule out myocardial infarction with certainty. If myocardial infarction is still suspected, repeat the test at appropriate intervals.   CBC     Status: Abnormal   Collection Time: 10/10/14  2:57 AM  Result Value Ref Range   WBC 11.7 (H) 4.0 - 10.5 K/uL   RBC 3.91 3.87 - 5.11 MIL/uL   Hemoglobin 10.1 (L) 12.0 - 15.0 g/dL   HCT 31.7 (L) 36.0 - 46.0 %   MCV 81.1 78.0 - 100.0 fL   MCH 25.8 (L) 26.0 - 34.0 pg   MCHC 31.9 30.0 - 36.0 g/dL   RDW 16.0 (H) 11.5 - 15.5 %   Platelets 215 150 - 400 K/uL  Basic  metabolic panel     Status: Abnormal   Collection Time: 10/10/14  2:57 AM  Result Value Ref Range   Sodium 140 137 - 147 mEq/L   Potassium 3.1 (L) 3.7 - 5.3 mEq/L   Chloride 103 96 - 112 mEq/L   CO2 23 19 - 32 mEq/L   Glucose, Bld 125 (H) 70 - 99 mg/dL   BUN 14 6 - 23 mg/dL   Creatinine, Ser 1.29 (H) 0.50 - 1.10 mg/dL   Calcium 8.9 8.4 - 10.5 mg/dL   GFR calc non Af Amer 39 (L) >90 mL/min   GFR calc Af Amer 46 (L) >90  mL/min    Comment: (NOTE) The eGFR has been calculated using the CKD EPI equation. This calculation has not been validated in all clinical situations. eGFR's persistently <90 mL/min signify possible Chronic Kidney Disease.    Anion gap 14 5 - 15  Glucose, capillary     Status: Abnormal   Collection Time: 10/10/14  8:16 AM  Result Value Ref Range   Glucose-Capillary 139 (H) 70 - 99 mg/dL   Comment 1 Notify RN     ABGS No results for input(s): PHART, PO2ART, TCO2, HCO3 in the last 72 hours.  Invalid input(s): PCO2 CULTURES Recent Results (from the past 240 hour(s))  MRSA PCR Screening     Status: Abnormal   Collection Time: 10/09/14  3:00 PM  Result Value Ref Range Status   MRSA by PCR POSITIVE (A) NEGATIVE Final    Comment:        The GeneXpert MRSA Assay (FDA approved for NASAL specimens only), is one component of a comprehensive MRSA colonization surveillance program. It is not intended to diagnose MRSA infection nor to guide or monitor treatment for MRSA infections. RESULT CALLED TO, READ BACK BY AND VERIFIED WITH: MORRIS,C. AT 1915 ON 10/09/2014 BY BAUGHAM,M.    Studies/Results: Dg Chest 2 View  10/09/2014   CLINICAL DATA:  Lower abdominal pain, nausea, vomiting and fever.  EXAM: CHEST  2 VIEW  COMPARISON:  Portable chest dated 09/30/2013.  FINDINGS: Enlarged cardiac silhouette with improvement. 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 Clear lungs with normal vascularity. Thoracic spine degenerative changes.  IMPRESSION: No  acute abnormality.   Electronically Signed   By: Enrique Sack M.D.   On: 10/09/2014 12:01   Ct Abdomen Pelvis W Contrast  10/09/2014   CLINICAL DATA:  Nausea, vomiting, fever and lower abdomen pain starting this morning.  EXAM: CT ABDOMEN AND PELVIS WITH CONTRAST  TECHNIQUE: Multidetector CT imaging of the abdomen and pelvis was performed using the standard protocol following bolus administration of intravenous contrast.  CONTRAST:  115mL OMNIPAQUE IOHEXOL 300 MG/ML SOLN, 14mL OMNIPAQUE IOHEXOL 300 MG/ML SOLN  COMPARISON:  June 29, 2013  FINDINGS: There is mild diffuse fatty infiltration of liver. There is mild intrahepatic biliary ductal dilatation, postsurgical. The patient is status post prior cholecystectomy. The spleen, pancreas, adrenal glands and kidneys are normal. There is no hydronephrosis bilaterally. There is atherosclerosis of the abdominal aorta without aneurysmal dilatation. There is no abdominal lymphadenopathy. There is small umbilical herniation of mesenteric fat unchanged.  There is a small hiatal hernia. There is no small bowel obstruction. Marked bowel content is identified throughout colon. The appendix is not seen but no inflammation is noted around cecum.  Fluid-filled bladder is identified with a small focus air identified within the bladder lumen, question recent instrumentation. Clinical correlation is recommended. Evaluation of the lower pelvis is limited due to streak artifact from bilateral hip replacements. There is mild atelectasis of the posterior lungs. The heart size is enlarged. Patient is status post prior posterior fusion of the lumbar spine unchanged. Degenerative joint changes of the spine are noted.  IMPRESSION: No acute abnormality identified in the abdomen and pelvis. No evidence of bowel obstruction.  Constipation.   Electronically Signed   By: Abelardo Diesel M.D.   On: 10/09/2014 11:17    Medications:  Prior to Admission:  Prescriptions prior to admission   Medication Sig Dispense Refill Last Dose  . acetaminophen (TYLENOL) 325 MG tablet Take 1-2 tablets (325-650 mg total) by mouth  every 6 (six) hours as needed. (Patient taking differently: Take 325-650 mg by mouth every 6 (six) hours as needed for mild pain. )   Past Month at Unknown time  . aspirin 81 MG tablet Take 81 mg by mouth daily.   10/08/2014 at Unknown time  . atorvastatin (LIPITOR) 40 MG tablet Take 1 tablet (40 mg total) by mouth daily. 30 tablet 0 10/08/2014 at Unknown time  . benazepril (LOTENSIN) 40 MG tablet Take 40 mg by mouth every morning.   10/08/2014 at Unknown time  . docusate sodium 100 MG CAPS Take 100 mg by mouth 2 (two) times daily. 10 capsule 0 10/08/2014 at Unknown time  . furosemide (LASIX) 20 MG tablet Take 20 mg by mouth daily.   10/08/2014 at Unknown time  . ketorolac (ACULAR) 0.5 % ophthalmic solution Place 1 drop into the right eye 3 (three) times daily.   10/08/2014 at Unknown time  . levETIRAcetam (KEPPRA) 500 MG tablet Take 500 mg by mouth 2 (two) times daily.   10/08/2014 at Unknown time  . magnesium hydroxide (MILK OF MAGNESIA) 400 MG/5ML suspension Take 30 mLs by mouth daily as needed for mild constipation.   10/08/2014 at Unknown time  . metFORMIN (GLUCOPHAGE) 500 MG tablet Take 500 mg by mouth 2 (two) times daily with a meal.    10/08/2014 at Unknown time  . metoprolol tartrate (LOPRESSOR) 25 MG tablet Take 25 mg by mouth 2 (two) times daily.    10/08/2014 at 1800  . nitroGLYCERIN (NITROSTAT) 0.4 MG SL tablet Place 1 tablet (0.4 mg total) under the tongue every 5 (five) minutes as needed for chest pain. 25 tablet 1 unknown  . omega-3 acid ethyl esters (LOVAZA) 1 G capsule Take 2 g by mouth 2 (two) times daily.    10/08/2014 at Unknown time  . oxybutynin (DITROPAN-XL) 10 MG 24 hr tablet Take 10 mg by mouth every morning.    10/08/2014 at Unknown time  . pantoprazole (PROTONIX) 40 MG tablet Take 1 tablet (40 mg total) by mouth daily. 30 tablet 3 10/08/2014 at Unknown time   . polyethylene glycol (MIRALAX / GLYCOLAX) packet Take 17 g by mouth 2 (two) times daily. (Patient taking differently: Take 17 g by mouth daily as needed for mild constipation. ) 14 each 0 10/08/2014 at Unknown time  . potassium chloride (K-DUR,KLOR-CON) 10 MEQ tablet Take 10 mEq by mouth daily.   10/08/2014 at Unknown time  . traMADol (ULTRAM) 50 MG tablet Take 1-2 tablets (50-100 mg total) by mouth every 6 (six) hours as needed for moderate pain. 80 tablet 0 Past Week at Unknown time  . furosemide (LASIX) 20 MG tablet TAKE 1 TABLET BY MOUTH IN THE MORNING. 30 tablet 3    Scheduled: . aspirin EC  81 mg Oral Daily  . atorvastatin  40 mg Oral q1800  . cefTRIAXone (ROCEPHIN)  IV  1 g Intravenous Q24H  . Chlorhexidine Gluconate Cloth  6 each Topical Q0600  . heparin  5,000 Units Subcutaneous 3 times per day  . insulin aspart  0-5 Units Subcutaneous QHS  . insulin aspart  0-9 Units Subcutaneous TID WC  . ketorolac  1 drop Right Eye TID  . levETIRAcetam  500 mg Oral BID  . metoprolol tartrate  25 mg Oral BID  . milk and molasses  1 enema Rectal Once  . mupirocin ointment  1 application Nasal BID  . omega-3 acid ethyl esters  2 g Oral BID  . oxybutynin  10 mg Oral q morning - 10a  . pantoprazole  40 mg Oral Daily  . potassium chloride  20 mEq Oral BID   Continuous: . sodium chloride     AID:[id number] **OR** acetaminophen, nitroGLYCERIN, ondansetron **OR** ondansetron (ZOFRAN) IV, polyethylene glycol  Assesment:she was admitted with intractable nausea and vomiting. This may be related to stool impaction but her urinalysis was not totally clear. The confusion may be related to UTIs I'm going to get a urine culture and start her on an antibiotic. She has diabetes which is stable and hypertension which is well controlled she has a history of seizures but has not had a seizure recently  Principal Problem:   Intractable nausea and vomiting Active Problems:   Chronic diastolic heart failure    Anemia, normocytic normochromic   Diabetes mellitus, type II   Hypertension   Epigastric abdominal pain    Plan:continue current treatments. She has an enema ordered and I'll start her on Rocephin until we know about her urine    LOS: 1 day   Aaryan Essman L 10/10/2014, 9:05 AM

## 2014-10-10 NOTE — Progress Notes (Addendum)
In and out catheter procedure completed.  Patient tolerated the procedure without distress or complaints at this time.  Patient output was 240 cc's of clear yellow urine.  [facility]iman sent for urine culture.  Rochephin was started after the specimen was retrieved.  Catie BurlingtonBeeson, Rn accompanied me while inserting the in and out cath.  Pericare was provided prior to insertion and a time out was performed.

## 2014-10-10 NOTE — Progress Notes (Signed)
Late Entry : Notified Dr. Juanetta GoslingHawkins of the patients complaint of constipation and possible impaction.  Recommended to him disimpaction and enema of choice.  Voiced to him that the patient was already on Myralax with no current results.  New orders given and followed.

## 2014-10-10 NOTE — Progress Notes (Signed)
Pt. Vomited after I gave her the morning meds. Will continue to monitor.

## 2014-10-10 NOTE — Progress Notes (Signed)
UR completed 

## 2014-10-10 NOTE — Progress Notes (Signed)
Pt has not voided all day. Notified Dr. Juanetta GoslingHawkins and requested an in and out cath. Dr. Juanetta GoslingHawkins agreed with the order.WIll continue to monitor.

## 2014-10-11 LAB — GLUCOSE, CAPILLARY
Glucose-Capillary: 133 mg/dL — ABNORMAL HIGH (ref 70–99)
Glucose-Capillary: 111 mg/dL — ABNORMAL HIGH (ref 70–99)
Glucose-Capillary: 139 mg/dL — ABNORMAL HIGH (ref 70–99)

## 2014-10-11 MED ORDER — BENAZEPRIL HCL 10 MG PO TABS
40.0000 mg | ORAL_TABLET | Freq: Every morning | ORAL | Status: DC
  Administered 2014-10-11 – 2014-10-12 (×2): 40 mg via ORAL
  Filled 2014-10-11 (×2): qty 4

## 2014-10-11 MED ORDER — FUROSEMIDE 20 MG PO TABS
20.0000 mg | ORAL_TABLET | Freq: Every day | ORAL | Status: DC
  Administered 2014-10-11 – 2014-10-12 (×2): 20 mg via ORAL
  Filled 2014-10-11 (×2): qty 1

## 2014-10-11 NOTE — Progress Notes (Signed)
Subjective: She says she feels better. No further episodes of vomiting. Her blood pressure has been up  Objective: Vital signs in last 24 hours: Temp:  [97.9 F (36.6 C)-98.9 F (37.2 C)] 98 F (36.7 C) (11/07 0603) Pulse Rate:  [60-67] 67 (11/07 1024) Resp:  [16] 16 (11/07 0603) BP: (170-183)/(77-100) 175/100 mmHg (11/07 1024) SpO2:  [99 %-100 %] 100 % (11/07 0603) Weight change:  Last BM Date: 10/10/14  Intake/Output from previous day: 11/06 0701 - 11/07 0700 In: -  Out: 901 [Urine:900; Emesis/NG output:1]  PHYSICAL EXAM General appearance: alert, cooperative and no distress Resp: clear to auscultation bilaterally Cardio: regular rate and rhythm, S1, S2 normal, no murmur, click, rub or gallop GI: soft, non-tender; bowel sounds normal; no masses,  no organomegaly Extremities: extremities normal, atraumatic, no cyanosis or edema  Lab Results:  Results for orders placed or performed during the hospital encounter of 10/09/14 (from the past 48 hour(s))  Influenza panel by pcr     Status: None   Collection Time: 10/09/14  1:15 PM  Result Value Ref Range   Influenza A By PCR NEGATIVE NEGATIVE   Influenza B By PCR NEGATIVE NEGATIVE   H1N1 flu by pcr NOT DETECTED NOT DETECTED    Comment:        The Xpert Flu assay (FDA approved for nasal aspirates or washes and nasopharyngeal swab specimens), is intended as an aid in the diagnosis of influenza and should not be used as a sole basis for treatment.   MRSA PCR Screening     Status: Abnormal   Collection Time: 10/09/14  3:00 PM  Result Value Ref Range   MRSA by PCR POSITIVE (A) NEGATIVE    Comment:        The GeneXpert MRSA Assay (FDA approved for NASAL specimens only), is one component of a comprehensive MRSA colonization surveillance program. It is not intended to diagnose MRSA infection nor to guide or monitor treatment for MRSA infections. RESULT CALLED TO, READ BACK BY AND VERIFIED WITH: MORRIS,C. AT 1915 ON  10/09/2014 BY BAUGHAM,M.   Hemoglobin A1c     Status: Abnormal   Collection Time: 10/09/14  3:13 PM  Result Value Ref Range   Hgb A1c MFr Bld 6.2 (H) <5.7 %    Comment: (NOTE)                                                                       According to the ADA Clinical Practice Recommendations for 2011, when HbA1c is used as a screening test:  >=6.5%   Diagnostic of Diabetes Mellitus           (if abnormal result is confirmed) 5.7-6.4%   Increased risk of developing Diabetes Mellitus References:Diagnosis and Classification of Diabetes Mellitus,Diabetes YHCW,2376,28(BTDVV 1):S62-S69 and Standards of Medical Care in         Diabetes - 2011,Diabetes OHYW,7371,06 (Suppl 1):S11-S61.    Mean Plasma Glucose 131 (H) <117 mg/dL    Comment: Performed at [facility]  Troponin I (q 6hr x 3)     Status: None   Collection Time: 10/09/14  3:13 PM  Result Value Ref Range   Troponin I <0.30 <0.30 ng/mL    Comment:  Due to the release kinetics of cTnI, a negative result within the first hours of the onset of symptoms does not rule out myocardial infarction with certainty. If myocardial infarction is still suspected, repeat the test at appropriate intervals.   Glucose, capillary     Status: Abnormal   Collection Time: 10/09/14  4:21 PM  Result Value Ref Range   Glucose-Capillary 134 (H) 70 - 99 mg/dL   Comment 1 Notify RN   Troponin I (q 6hr x 3)     Status: None   Collection Time: 10/09/14  8:29 PM  Result Value Ref Range   Troponin I <0.30 <0.30 ng/mL    Comment:        Due to the release kinetics of cTnI, a negative result within the first hours of the onset of symptoms does not rule out myocardial infarction with certainty. If myocardial infarction is still suspected, repeat the test at appropriate intervals.   Glucose, capillary     Status: Abnormal   Collection Time: 10/09/14  8:58 PM  Result Value Ref Range   Glucose-Capillary 149 (H) 70 - 99 mg/dL    Comment 1 Notify RN   Troponin I (q 6hr x 3)     Status: None   Collection Time: 10/10/14  2:57 AM  Result Value Ref Range   Troponin I <0.30 <0.30 ng/mL    Comment:        Due to the release kinetics of cTnI, a negative result within the first hours of the onset of symptoms does not rule out myocardial infarction with certainty. If myocardial infarction is still suspected, repeat the test at appropriate intervals.   CBC     Status: Abnormal   Collection Time: 10/10/14  2:57 AM  Result Value Ref Range   WBC 11.7 (H) 4.0 - 10.5 K/uL   RBC 3.91 3.87 - 5.11 MIL/uL   Hemoglobin 10.1 (L) 12.0 - 15.0 g/dL   HCT 31.7 (L) 36.0 - 46.0 %   MCV 81.1 78.0 - 100.0 fL   MCH 25.8 (L) 26.0 - 34.0 pg   MCHC 31.9 30.0 - 36.0 g/dL   RDW 16.0 (H) 11.5 - 15.5 %   Platelets 215 150 - 400 K/uL  Basic metabolic panel     Status: Abnormal   Collection Time: 10/10/14  2:57 AM  Result Value Ref Range   Sodium 140 137 - 147 mEq/L   Potassium 3.1 (L) 3.7 - 5.3 mEq/L   Chloride 103 96 - 112 mEq/L   CO2 23 19 - 32 mEq/L   Glucose, Bld 125 (H) 70 - 99 mg/dL   BUN 14 6 - 23 mg/dL   Creatinine, Ser 1.29 (H) 0.50 - 1.10 mg/dL   Calcium 8.9 8.4 - 10.5 mg/dL   GFR calc non Af Amer 39 (L) >90 mL/min   GFR calc Af Amer 46 (L) >90 mL/min    Comment: (NOTE) The eGFR has been calculated using the CKD EPI equation. This calculation has not been validated in all clinical situations. eGFR's persistently <90 mL/min signify possible Chronic Kidney Disease.    Anion gap 14 5 - 15  Glucose, capillary     Status: Abnormal   Collection Time: 10/10/14  8:16 AM  Result Value Ref Range   Glucose-Capillary 139 (H) 70 - 99 mg/dL   Comment 1 Notify RN   Glucose, capillary     Status: Abnormal   Collection Time: 10/10/14 11:41 AM  Result Value Ref Range  Glucose-Capillary 134 (H) 70 - 99 mg/dL   Comment 1 Notify RN   Glucose, capillary     Status: Abnormal   Collection Time: 10/10/14  5:15 PM  Result Value Ref Range    Glucose-Capillary 123 (H) 70 - 99 mg/dL   Comment 1 Notify RN   Glucose, capillary     Status: Abnormal   Collection Time: 10/10/14  9:50 PM  Result Value Ref Range   Glucose-Capillary 103 (H) 70 - 99 mg/dL   Comment 1 Documented in Chart    Comment 2 Notify RN   Glucose, capillary     Status: Abnormal   Collection Time: 10/11/14  7:37 AM  Result Value Ref Range   Glucose-Capillary 133 (H) 70 - 99 mg/dL   Comment 1 Notify RN     ABGS No results for input(s): PHART, PO2ART, TCO2, HCO3 in the last 72 hours.  Invalid input(s): PCO2 CULTURES Recent Results (from the past 240 hour(s))  MRSA PCR Screening     Status: Abnormal   Collection Time: 10/09/14  3:00 PM  Result Value Ref Range Status   MRSA by PCR POSITIVE (A) NEGATIVE Final    Comment:        The GeneXpert MRSA Assay (FDA approved for NASAL specimens only), is one component of a comprehensive MRSA colonization surveillance program. It is not intended to diagnose MRSA infection nor to guide or monitor treatment for MRSA infections. RESULT CALLED TO, READ BACK BY AND VERIFIED WITH: MORRIS,C. AT 1915 ON 10/09/2014 BY BAUGHAM,M.    Studies/Results: Dg Chest 2 View  10/09/2014   CLINICAL DATA:  Lower abdominal pain, nausea, vomiting and fever.  EXAM: CHEST  2 VIEW  COMPARISON:  Portable chest dated 09/30/2013.  FINDINGS: Enlarged cardiac silhouette with improvement. 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 Clear lungs with normal vascularity. Thoracic spine degenerative changes.  IMPRESSION: No acute abnormality.   Electronically Signed   By: Enrique Sack M.D.   On: 10/09/2014 12:01    Medications:  Prior to Admission:  Prescriptions prior to admission  Medication Sig Dispense Refill Last Dose  . acetaminophen (TYLENOL) 325 MG tablet Take 1-2 tablets (325-650 mg total) by mouth every 6 (six) hours as needed. (Patient taking differently: Take 325-650 mg by mouth every 6 (six) hours as needed for  mild pain. )   Past Month at Unknown time  . aspirin 81 MG tablet Take 81 mg by mouth daily.   10/08/2014 at Unknown time  . atorvastatin (LIPITOR) 40 MG tablet Take 1 tablet (40 mg total) by mouth daily. 30 tablet 0 10/08/2014 at Unknown time  . benazepril (LOTENSIN) 40 MG tablet Take 40 mg by mouth every morning.   10/08/2014 at Unknown time  . docusate sodium 100 MG CAPS Take 100 mg by mouth 2 (two) times daily. 10 capsule 0 10/08/2014 at Unknown time  . furosemide (LASIX) 20 MG tablet Take 20 mg by mouth daily.   10/08/2014 at Unknown time  . ketorolac (ACULAR) 0.5 % ophthalmic solution Place 1 drop into the right eye 3 (three) times daily.   10/08/2014 at Unknown time  . levETIRAcetam (KEPPRA) 500 MG tablet Take 500 mg by mouth 2 (two) times daily.   10/08/2014 at Unknown time  . magnesium hydroxide (MILK OF MAGNESIA) 400 MG/5ML suspension Take 30 mLs by mouth daily as needed for mild constipation.   10/08/2014 at Unknown time  . metFORMIN (GLUCOPHAGE) 500 MG tablet Take 500 mg by mouth  2 (two) times daily with a meal.    10/08/2014 at Unknown time  . metoprolol tartrate (LOPRESSOR) 25 MG tablet Take 25 mg by mouth 2 (two) times daily.    10/08/2014 at 1800  . nitroGLYCERIN (NITROSTAT) 0.4 MG SL tablet Place 1 tablet (0.4 mg total) under the tongue every 5 (five) minutes as needed for chest pain. 25 tablet 1 unknown  . omega-3 acid ethyl esters (LOVAZA) 1 G capsule Take 2 g by mouth 2 (two) times daily.    10/08/2014 at Unknown time  . oxybutynin (DITROPAN-XL) 10 MG 24 hr tablet Take 10 mg by mouth every morning.    10/08/2014 at Unknown time  . pantoprazole (PROTONIX) 40 MG tablet Take 1 tablet (40 mg total) by mouth daily. 30 tablet 3 10/08/2014 at Unknown time  . polyethylene glycol (MIRALAX / GLYCOLAX) packet Take 17 g by mouth 2 (two) times daily. (Patient taking differently: Take 17 g by mouth daily as needed for mild constipation. ) 14 each 0 10/08/2014 at Unknown time  . potassium chloride  (K-DUR,KLOR-CON) 10 MEQ tablet Take 10 mEq by mouth daily.   10/08/2014 at Unknown time  . traMADol (ULTRAM) 50 MG tablet Take 1-2 tablets (50-100 mg total) by mouth every 6 (six) hours as needed for moderate pain. 80 tablet 0 Past Week at Unknown time  . furosemide (LASIX) 20 MG tablet TAKE 1 TABLET BY MOUTH IN THE MORNING. 30 tablet 3    Scheduled: . aspirin EC  81 mg Oral Daily  . atorvastatin  40 mg Oral q1800  . benazepril  40 mg Oral q morning - 10a  . cefTRIAXone (ROCEPHIN)  IV  1 g Intravenous Q24H  . Chlorhexidine Gluconate Cloth  6 each Topical Q0600  . furosemide  20 mg Oral Daily  . heparin  5,000 Units Subcutaneous 3 times per day  . insulin aspart  0-5 Units Subcutaneous QHS  . insulin aspart  0-9 Units Subcutaneous TID WC  . ketorolac  1 drop Right Eye TID  . levETIRAcetam  500 mg Oral BID  . metoprolol tartrate  25 mg Oral BID  . mupirocin ointment  1 application Nasal BID  . omega-3 acid ethyl esters  2 g Oral BID  . oxybutynin  10 mg Oral q morning - 10a  . pantoprazole  40 mg Oral Daily  . potassium chloride  20 mEq Oral BID   Continuous:  TLX:[id number] **OR** acetaminophen, nitroGLYCERIN, ondansetron **OR** ondansetron (ZOFRAN) IV, polyethylene glycol  Assesment:she was admitted with intractable nausea and vomiting that may have been related to fecal impaction. She is better. She's been able to keep her medications down. She has hypertension and her blood pressure has been up so I've restarted some of her other medications. She may have a urinary tract infection and culture is pending she is being treated for that.  Principal Problem:   Intractable nausea and vomiting Active Problems:   Chronic diastolic heart failure   Anemia, normocytic normochromic   Diabetes mellitus, type II   Hypertension   Epigastric abdominal pain    Plan:restart Lotensin. Continue other treatments. She may be ready for discharge tomorrow    LOS: 2 days   [name]  L 10/11/2014, 11:02 AM

## 2014-10-11 NOTE — Progress Notes (Signed)
Notified Dr. Juanetta GoslingHawkins of the patient's elevated blood pressure.  Voiced to him that patient takes Lotensin 40 mg at home that was not started this admission.  Dr. Juanetta GoslingHawkins stated he will put in an order.  Will continue to monitor patient closely.

## 2014-10-12 DIAGNOSIS — G9341 Metabolic encephalopathy: Secondary | ICD-10-CM | POA: Diagnosis present

## 2014-10-12 LAB — GLUCOSE, CAPILLARY
GLUCOSE-CAPILLARY: 151 mg/dL — AB (ref 70–99)
GLUCOSE-CAPILLARY: 138 mg/dL — AB (ref 70–99)

## 2014-10-12 MED ORDER — HEPARIN SOD (PORK) LOCK FLUSH 100 UNIT/ML IV SOLN: 500.0000 [IU] | Freq: Once | INTRAVENOUS | Status: DC

## 2014-10-12 NOTE — Discharge Summary (Signed)
Physician Discharge Summary  Patient ID: [name] MRN: [id number] DOB/AGE: 1941-01-05 73 y.o. Primary Care Physician:Arvella Massingale L, MD Admit date: 10/09/2014 Discharge date: 10/12/2014    Discharge Diagnoses:   Principal Problem:   Intractable nausea and vomiting Active Problems:   Chronic diastolic heart failure   Anemia, normocytic normochromic   Diabetes mellitus, type II   Hypertension   Sleep apnea   Seizure disorder   Dehydration   Spinal stenosis, lumbar region, with neurogenic claudication   Fecal impaction   Altered mental state   Epigastric abdominal pain   Encephalopathy, metabolic     Medication List    ASK your doctor about these medications        acetaminophen 325 MG tablet  Commonly known as:  TYLENOL  Take 1-2 tablets (325-650 mg total) by mouth every 6 (six) hours as needed.     aspirin 81 MG tablet  Take 81 mg by mouth daily.     atorvastatin 40 MG tablet  Commonly known as:  LIPITOR  Take 1 tablet (40 mg total) by mouth daily.     benazepril 40 MG tablet  Commonly known as:  LOTENSIN  Take 40 mg by mouth every morning.     DSS 100 MG Caps  Take 100 mg by mouth 2 (two) times daily.     furosemide 20 MG tablet  Commonly known as:  LASIX  Take 20 mg by mouth daily.     furosemide 20 MG tablet  Commonly known as:  LASIX  TAKE 1 TABLET BY MOUTH IN THE MORNING.     ketorolac 0.5 % ophthalmic solution  Commonly known as:  ACULAR  Place 1 drop into the right eye 3 (three) times daily.     levETIRAcetam 500 MG tablet  Commonly known as:  KEPPRA  Take 500 mg by mouth 2 (two) times daily.     magnesium hydroxide 400 MG/5ML suspension  Commonly known as:  MILK OF MAGNESIA  Take 30 mLs by mouth daily as needed for mild constipation.     metFORMIN 500 MG tablet  Commonly known as:  GLUCOPHAGE  Take 500 mg by mouth 2 (two) times daily with a meal.     metoprolol tartrate 25 MG tablet  Commonly known as:  LOPRESSOR  Take 25 mg by  mouth 2 (two) times daily.     nitroGLYCERIN 0.4 MG SL tablet  Commonly known as:  NITROSTAT  Place 1 tablet (0.4 mg total) under the tongue every 5 (five) minutes as needed for chest pain.     omega-3 acid ethyl esters 1 G capsule  Commonly known as:  LOVAZA  Take 2 g by mouth 2 (two) times daily.     oxybutynin 10 MG 24 hr tablet  Commonly known as:  DITROPAN-XL  Take 10 mg by mouth every morning.     pantoprazole 40 MG tablet  Commonly known as:  PROTONIX  Take 1 tablet (40 mg total) by mouth daily.     polyethylene glycol packet  Commonly known as:  MIRALAX / GLYCOLAX  Take 17 g by mouth 2 (two) times daily.     potassium chloride 10 MEQ tablet  Commonly known as:  K-DUR,KLOR-CON  Take 10 mEq by mouth daily.     traMADol 50 MG tablet  Commonly known as:  ULTRAM  Take 1-2 tablets (50-100 mg total) by mouth every 6 (six) hours as needed for moderate pain.        Discharged  Condition:improved    Consults:none  Significant Diagnostic Studies: Dg Chest 2 View  10/09/2014   CLINICAL DATA:  Lower abdominal pain, nausea, vomiting and fever.  EXAM: CHEST  2 VIEW  COMPARISON:  Portable chest dated 09/30/2013.  FINDINGS: Enlarged cardiac silhouette with improvement. 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 Clear lungs with normal vascularity. Thoracic spine degenerative changes.  IMPRESSION: No acute abnormality.   Electronically Signed   By: Gordan PaymentSteve  Reid M.D.   On: 10/09/2014 12:01   Ct Abdomen Pelvis W Contrast  10/09/2014   CLINICAL DATA:  Nausea, vomiting, fever and lower abdomen pain starting this morning.  EXAM: CT ABDOMEN AND PELVIS WITH CONTRAST  TECHNIQUE: Multidetector CT imaging of the abdomen and pelvis was performed using the standard protocol following bolus administration of intravenous contrast.  CONTRAST:  100mL OMNIPAQUE IOHEXOL 300 MG/ML SOLN, 50mL OMNIPAQUE IOHEXOL 300 MG/ML SOLN  COMPARISON:  June 29, 2013  FINDINGS: There is mild diffuse  fatty infiltration of liver. There is mild intrahepatic biliary ductal dilatation, postsurgical. The patient is status post prior cholecystectomy. The spleen, pancreas, adrenal glands and kidneys are normal. There is no hydronephrosis bilaterally. There is atherosclerosis of the abdominal aorta without aneurysmal dilatation. There is no abdominal lymphadenopathy. There is small umbilical herniation of mesenteric fat unchanged.  There is a small hiatal hernia. There is no small bowel obstruction. Marked bowel content is identified throughout colon. The appendix is not seen but no inflammation is noted around cecum.  Fluid-filled bladder is identified with a small focus air identified within the bladder lumen, question recent instrumentation. Clinical correlation is recommended. Evaluation of the lower pelvis is limited due to streak artifact from bilateral hip replacements. There is mild atelectasis of the posterior lungs. The heart size is enlarged. Patient is status post prior posterior fusion of the lumbar spine unchanged. Degenerative joint changes of the spine are noted.  IMPRESSION: No acute abnormality identified in the abdomen and pelvis. No evidence of bowel obstruction.  Constipation.   Electronically Signed   By: Sherian ReinWei-Chen  Lin M.D.   On: 10/09/2014 11:17    Lab Results: Basic Metabolic Panel:  Recent Labs  16/09/9610/06/15 0257  NA 140  K 3.1*  CL 103  CO2 23  GLUCOSE 125*  BUN 14  CREATININE 1.29*  CALCIUM 8.9   Liver Function Tests: No results for input(s): AST, ALT, ALKPHOS, BILITOT, PROT, ALBUMIN in the last 72 hours.   CBC:  Recent Labs  10/10/14 0257  WBC 11.7*  HGB 10.1*  HCT 31.7*  MCV 81.1  PLT 215    Recent Results (from the past 240 hour(s))  MRSA PCR Screening     Status: Abnormal   Collection Time: 10/09/14  3:00 PM  Result Value Ref Range Status   MRSA by PCR POSITIVE (A) NEGATIVE Final    Comment:        The GeneXpert MRSA Assay (FDA approved for NASAL  specimens only), is one component of a comprehensive MRSA colonization surveillance program. It is not intended to diagnose MRSA infection nor to guide or monitor treatment for MRSA infections. RESULT CALLED TO, READ BACK BY AND VERIFIED WITH: MORRIS,C. AT 1915 ON 10/09/2014 BY BAUGHAM,M.      [facility] was [facility] with confusion and intractable nausea and vomiting. She was treated in the emergency room but was not well enough to be discharged. There was concern that she might have a urinary tract infection but her urine culture thus far  is negative. It has not been signed out yet. She was found to have fecal impaction and this was removed and her nausea and vomiting improved. She was dehydrated and had fluid replacement. Her confusion cleared.  Discharge Exam: Blood pressure 174/82, pulse 61, temperature 100.2 F (37.9 C), temperature source Oral, resp. rate 16, height 5\' 2"  (1.575 m), weight 81.1 kg (178 lb 12.7 oz), SpO2 98 %. She is awake and alert. Chest is clear. Heart is regular. Abdomen is soft.  Disposition: discharge home      Signed: Earnestene Angello L   10/12/2014, 11:09 AM

## 2014-10-12 NOTE — Progress Notes (Signed)
Patient and patient's daughter were given discharge instructions and education.  They verbalized understanding with no complaints or concerns voiced at this time. Patient's implanted port was de-accessed with no bleeding or complications.  Patient left unit in stable condition with daughter and granddaughter in a wheelchair by a staff member.  [phone], to let them know patient has been discharged.  They are only open Monday-Friday.  I left a message that the patient has been discharged and if they have any questions to feel free to call the unit (left phone number to unit).

## 2014-10-12 NOTE — Progress Notes (Signed)
Patient's port was accidentally deaccessed, before it was flushed with Heparin, while trying to take heart monitor off.  Dr. Juanetta GoslingHawkins notified and said there was no need to reaccess it just to flush with heparin since it had been flushed with 10cc NS this morning.

## 2014-10-12 NOTE — Progress Notes (Signed)
Subjective: She feels better. No further nausea or vomiting. She wants to go home  Objective: Vital signs in last 24 hours: Temp:  [99.4 F (37.4 C)-100.2 F (37.9 C)] 100.2 F (37.9 C) (11/08 0526) Pulse Rate:  [59-62] 61 (11/08 0526) Resp:  [16] 16 (11/08 0526) BP: (156-182)/(81-96) 174/82 mmHg (11/08 0526) SpO2:  [96 %-98 %] 98 % (11/08 0526) Weight change:  Last BM Date: 10/11/14  Intake/Output from previous day: 11/07 0701 - 11/08 0700 In: 50 [IV Piggyback:50] Out: -   PHYSICAL EXAM General appearance: alert, cooperative and no distress Resp: clear to auscultation bilaterally Cardio: regular rate and rhythm, S1, S2 normal, no murmur, click, rub or gallop GI: soft, non-tender; bowel sounds normal; no masses,  no organomegaly Extremities: extremities normal, atraumatic, no cyanosis or edema  Lab Results:  Results for orders placed or performed during the hospital encounter of 10/09/14 (from the past 48 hour(s))  Glucose, capillary     Status: Abnormal   Collection Time: 10/10/14 11:41 AM  Result Value Ref Range   Glucose-Capillary 134 (H) 70 - 99 mg/dL   Comment 1 Notify RN   Glucose, capillary     Status: Abnormal   Collection Time: 10/10/14  5:15 PM  Result Value Ref Range   Glucose-Capillary 123 (H) 70 - 99 mg/dL   Comment 1 Notify RN   Glucose, capillary     Status: Abnormal   Collection Time: 10/10/14  9:50 PM  Result Value Ref Range   Glucose-Capillary 103 (H) 70 - 99 mg/dL   Comment 1 Documented in Chart    Comment 2 Notify RN   Glucose, capillary     Status: Abnormal   Collection Time: 10/11/14  7:37 AM  Result Value Ref Range   Glucose-Capillary 133 (H) 70 - 99 mg/dL   Comment 1 Notify RN   Glucose, capillary     Status: Abnormal   Collection Time: 10/11/14 12:17 PM  Result Value Ref Range   Glucose-Capillary 111 (H) 70 - 99 mg/dL   Comment 1 Notify RN   Glucose, capillary     Status: Abnormal   Collection Time: 10/11/14  4:25 PM  Result Value  Ref Range   Glucose-Capillary 139 (H) 70 - 99 mg/dL   Comment 1 Notify RN   Glucose, capillary     Status: Abnormal   Collection Time: 10/12/14  7:35 AM  Result Value Ref Range   Glucose-Capillary 151 (H) 70 - 99 mg/dL   Comment 1 Notify RN     ABGS No results for input(s): PHART, PO2ART, TCO2, HCO3 in the last 72 hours.  Invalid input(s): PCO2 CULTURES Recent Results (from the past 240 hour(s))  MRSA PCR Screening     Status: Abnormal   Collection Time: 10/09/14  3:00 PM  Result Value Ref Range Status   MRSA by PCR POSITIVE (A) NEGATIVE Final    Comment:        The GeneXpert MRSA Assay (FDA approved for NASAL specimens only), is one component of a comprehensive MRSA colonization surveillance program. It is not intended to diagnose MRSA infection nor to guide or monitor treatment for MRSA infections. RESULT CALLED TO, READ BACK BY AND VERIFIED WITH: MORRIS,C. AT 1915 ON 10/09/2014 BY BAUGHAM,M.    Studies/Results: No results found.  Medications:  Prior to Admission:  Prescriptions prior to admission  Medication Sig Dispense Refill Last Dose  . acetaminophen (TYLENOL) 325 MG tablet Take 1-2 tablets (325-650 mg total) by mouth every 6 (six)  hours as needed. (Patient taking differently: Take 325-650 mg by mouth every 6 (six) hours as needed for mild pain. )   Past Month at Unknown time  . aspirin 81 MG tablet Take 81 mg by mouth daily.   10/08/2014 at Unknown time  . atorvastatin (LIPITOR) 40 MG tablet Take 1 tablet (40 mg total) by mouth daily. 30 tablet 0 10/08/2014 at Unknown time  . benazepril (LOTENSIN) 40 MG tablet Take 40 mg by mouth every morning.   10/08/2014 at Unknown time  . docusate sodium 100 MG CAPS Take 100 mg by mouth 2 (two) times daily. 10 capsule 0 10/08/2014 at Unknown time  . furosemide (LASIX) 20 MG tablet Take 20 mg by mouth daily.   10/08/2014 at Unknown time  . ketorolac (ACULAR) 0.5 % ophthalmic solution Place 1 drop into the right eye 3 (three) times  daily.   10/08/2014 at Unknown time  . levETIRAcetam (KEPPRA) 500 MG tablet Take 500 mg by mouth 2 (two) times daily.   10/08/2014 at Unknown time  . magnesium hydroxide (MILK OF MAGNESIA) 400 MG/5ML suspension Take 30 mLs by mouth daily as needed for mild constipation.   10/08/2014 at Unknown time  . metFORMIN (GLUCOPHAGE) 500 MG tablet Take 500 mg by mouth 2 (two) times daily with a meal.    10/08/2014 at Unknown time  . metoprolol tartrate (LOPRESSOR) 25 MG tablet Take 25 mg by mouth 2 (two) times daily.    10/08/2014 at 1800  . nitroGLYCERIN (NITROSTAT) 0.4 MG SL tablet Place 1 tablet (0.4 mg total) under the tongue every 5 (five) minutes as needed for chest pain. 25 tablet 1 unknown  . omega-3 acid ethyl esters (LOVAZA) 1 G capsule Take 2 g by mouth 2 (two) times daily.    10/08/2014 at Unknown time  . oxybutynin (DITROPAN-XL) 10 MG 24 hr tablet Take 10 mg by mouth every morning.    10/08/2014 at Unknown time  . pantoprazole (PROTONIX) 40 MG tablet Take 1 tablet (40 mg total) by mouth daily. 30 tablet 3 10/08/2014 at Unknown time  . polyethylene glycol (MIRALAX / GLYCOLAX) packet Take 17 g by mouth 2 (two) times daily. (Patient taking differently: Take 17 g by mouth daily as needed for mild constipation. ) 14 each 0 10/08/2014 at Unknown time  . potassium chloride (K-DUR,KLOR-CON) 10 MEQ tablet Take 10 mEq by mouth daily.   10/08/2014 at Unknown time  . traMADol (ULTRAM) 50 MG tablet Take 1-2 tablets (50-100 mg total) by mouth every 6 (six) hours as needed for moderate pain. 80 tablet 0 Past Week at Unknown time  . furosemide (LASIX) 20 MG tablet TAKE 1 TABLET BY MOUTH IN THE MORNING. 30 tablet 3    Scheduled: . aspirin EC  81 mg Oral Daily  . atorvastatin  40 mg Oral q1800  . benazepril  40 mg Oral q morning - 10a  . cefTRIAXone (ROCEPHIN)  IV  1 g Intravenous Q24H  . Chlorhexidine Gluconate Cloth  6 each Topical Q0600  . furosemide  20 mg Oral Daily  . heparin  5,000 Units Subcutaneous 3 times per  day  . insulin aspart  0-5 Units Subcutaneous QHS  . insulin aspart  0-9 Units Subcutaneous TID WC  . ketorolac  1 drop Right Eye TID  . levETIRAcetam  500 mg Oral BID  . metoprolol tartrate  25 mg Oral BID  . mupirocin ointment  1 application Nasal BID  . omega-3 acid ethyl esters  2  g Oral BID  . oxybutynin  10 mg Oral q morning - 10a  . pantoprazole  40 mg Oral Daily  . potassium chloride  20 mEq Oral BID   Continuous:  ZOX:[id number]:acetaminophen **OR** acetaminophen, nitroGLYCERIN, ondansetron **OR** ondansetron (ZOFRAN) IV, polyethylene glycol  Assesment:she was admitted with intractable nausea and vomiting. This has resolved. This may have been related to fecal impaction. She is back to baseline. She is ready for discharge.  Principal Problem:   Intractable nausea and vomiting Active Problems:   Chronic diastolic heart failure   Anemia, normocytic normochromic   Diabetes mellitus, type II   Hypertension   Epigastric abdominal pain    Plan:discharge home today    LOS: 3 days   Quintarius Ferns L 10/12/2014, 11:05 AM

## 2014-10-13 LAB — URINE CULTURE
COLONY COUNT: NO GROWTH
Culture: NO GROWTH

## 2014-10-13 LAB — HIV-1 RNA QUANT-NO REFLEX-BLD: HIV-1 RNA Quant, Log: <1.3 {Log} (ref <1.30)

## 2014-10-16 LAB — CULTURE, BLOOD (ROUTINE X 2)
Culture: NO GROWTH
Culture: NO GROWTH

## 2014-11-04 ENCOUNTER — Ambulatory Visit (HOSPITAL_COMMUNITY)
Admission: RE | Admit: 2014-11-04 | Discharge: 2014-11-04 | Disposition: A | Discharge status: Home / Self Care | Payer: PRIVATE HEALTH INSURANCE | Source: Ambulatory Visit | Attending: Pulmonary Disease | Admitting: Pulmonary Disease

## 2014-11-04 ENCOUNTER — Other Ambulatory Visit (HOSPITAL_COMMUNITY): Payer: Self-pay | Admitting: Pulmonary Disease

## 2014-11-04 DIAGNOSIS — M79672 Pain in left foot: Secondary | ICD-10-CM | POA: Insufficient documentation

## 2014-11-06 ENCOUNTER — Encounter: Payer: PRIVATE HEALTH INSURANCE | Admitting: Internal Medicine

## 2014-12-15 DIAGNOSIS — H53452 Other localized visual field defect, left eye: Secondary | ICD-10-CM | POA: Diagnosis not present

## 2014-12-15 DIAGNOSIS — H25812 Combined forms of age-related cataract, left eye: Secondary | ICD-10-CM | POA: Diagnosis not present

## 2014-12-15 DIAGNOSIS — H40009 Preglaucoma, unspecified, unspecified eye: Secondary | ICD-10-CM | POA: Diagnosis not present

## 2014-12-16 DIAGNOSIS — Z961 Presence of intraocular lens: Secondary | ICD-10-CM | POA: Diagnosis not present

## 2014-12-16 DIAGNOSIS — E119 Type 2 diabetes mellitus without complications: Secondary | ICD-10-CM | POA: Diagnosis not present

## 2014-12-16 DIAGNOSIS — H3322 Serous retinal detachment, left eye: Secondary | ICD-10-CM | POA: Diagnosis not present

## 2014-12-16 DIAGNOSIS — H2512 Age-related nuclear cataract, left eye: Secondary | ICD-10-CM | POA: Diagnosis not present

## 2014-12-17 DIAGNOSIS — G5792 Unspecified mononeuropathy of left lower limb: Secondary | ICD-10-CM | POA: Diagnosis not present

## 2014-12-17 DIAGNOSIS — M961 Postlaminectomy syndrome, not elsewhere classified: Secondary | ICD-10-CM | POA: Diagnosis not present

## 2014-12-24 ENCOUNTER — Ambulatory Visit (HOSPITAL_COMMUNITY)
Admission: RE | Admit: 2014-12-24 | Discharge: 2014-12-24 | Disposition: A | Discharge status: Home / Self Care | Payer: Commercial Managed Care - HMO | Source: Ambulatory Visit | Attending: Pulmonary Disease | Admitting: Pulmonary Disease

## 2014-12-24 ENCOUNTER — Other Ambulatory Visit (HOSPITAL_COMMUNITY): Payer: Self-pay | Admitting: Pulmonary Disease

## 2014-12-24 DIAGNOSIS — M7989 Other specified soft tissue disorders: Secondary | ICD-10-CM | POA: Insufficient documentation

## 2014-12-24 DIAGNOSIS — M79672 Pain in left foot: Secondary | ICD-10-CM

## 2014-12-24 DIAGNOSIS — M25552 Pain in left hip: Secondary | ICD-10-CM | POA: Insufficient documentation

## 2014-12-24 DIAGNOSIS — S99922A Unspecified injury of left foot, initial encounter: Secondary | ICD-10-CM | POA: Diagnosis not present

## 2014-12-24 DIAGNOSIS — W19XXXA Unspecified fall, initial encounter: Secondary | ICD-10-CM | POA: Insufficient documentation

## 2014-12-25 IMAGING — CR DG ABDOMEN ACUTE W/ 1V CHEST
3 series · 3 of 3 positions shown · non-contrast
Comparison: 03/03/2013

CLINICAL DATA: Constipation, confusion, vomiting, hypertension,
diabetes, COPD, GERD

ACUTE ABDOMEN SERIES (ABDOMEN 2 VIEW & CHEST 1 VIEW)

[view not recorded (1 of 3)]
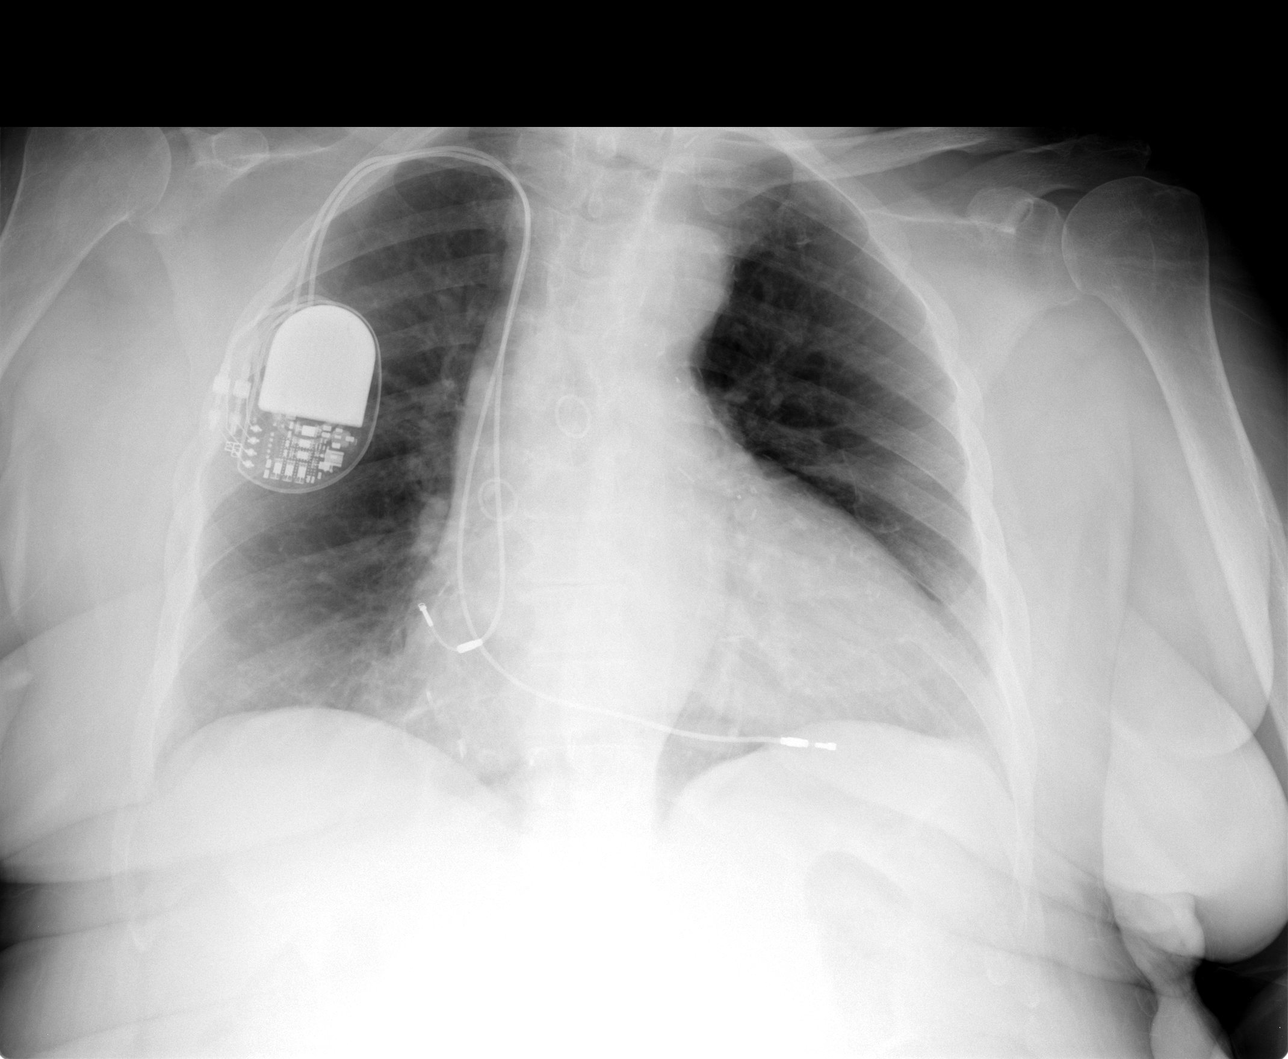

[view not recorded (2 of 3)]
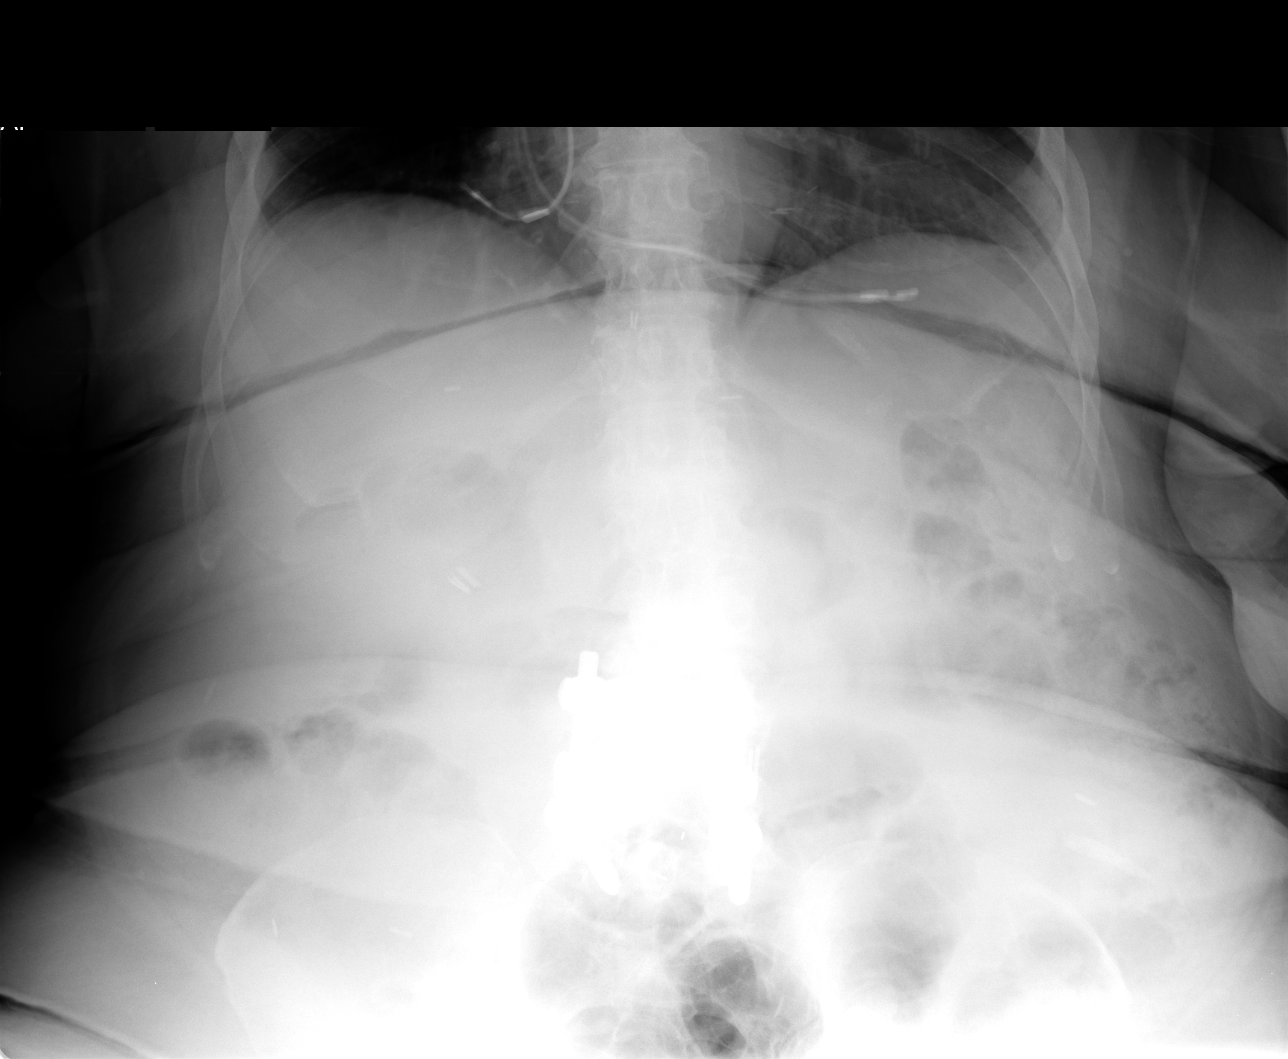

[view not recorded (3 of 3)]
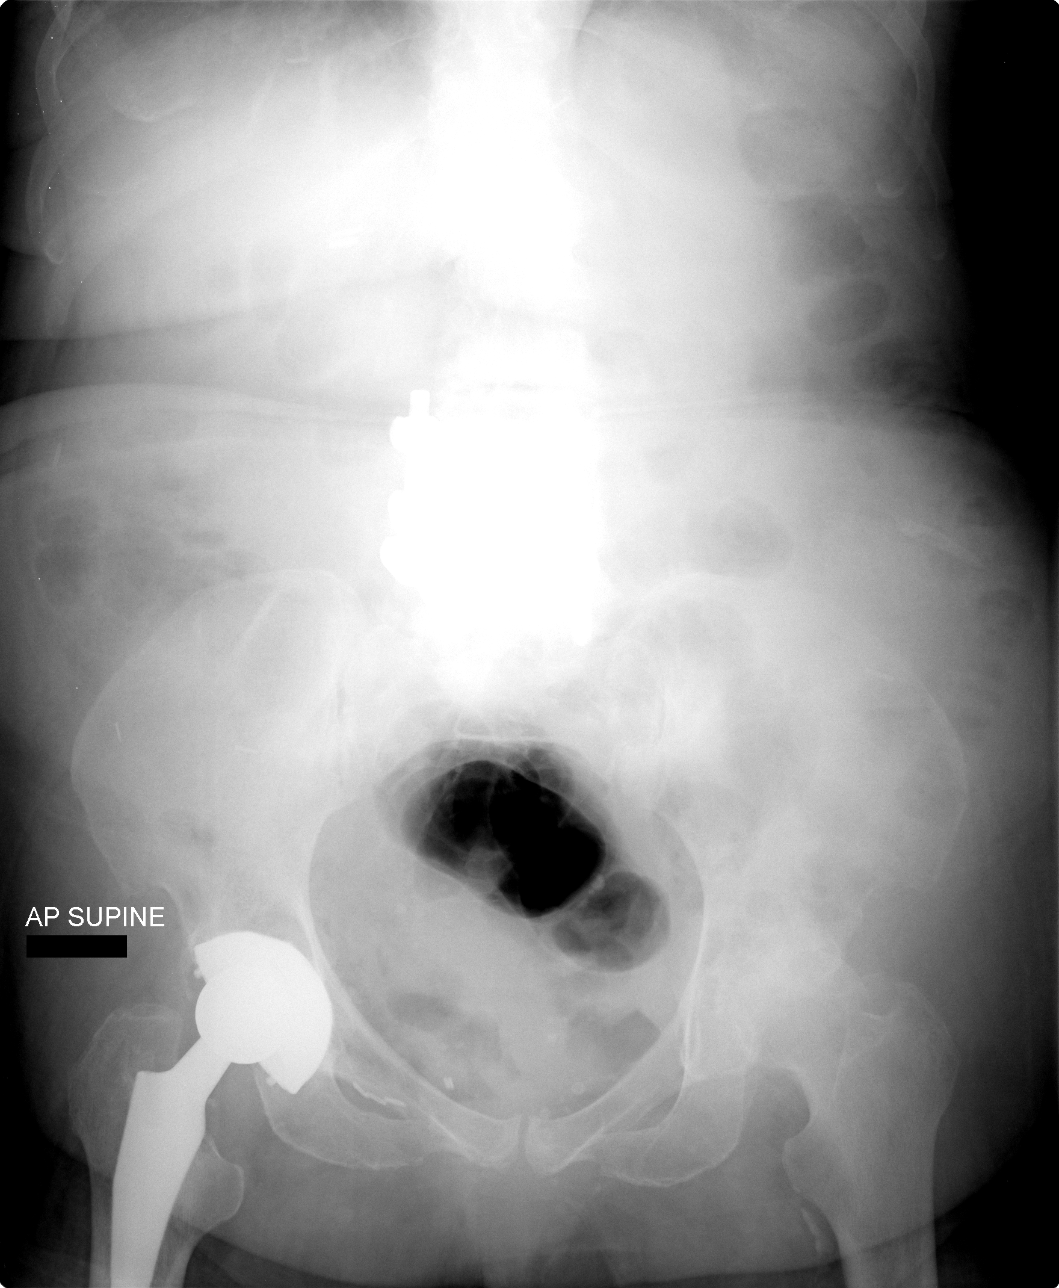

[3 of 3 positions shown; findings below may reference images not displayed]

FINDINGS: Right subclavian transvenous pacemaker leads project over right
atrium and right ventricle.
Enlargement of cardiac silhouette post CABG.
Tortuous aorta.
Pulmonary vascularity normal.
Lungs clear.
No pleural effusion or pneumothorax.
Prior lumbar fusion.
Surgical clips right upper quadrant question cholecystectomy.
Prior right hip replacement.
Nonobstructive bowel gas pattern.
No bowel dilatation, bowel wall thickening, or free intraperitoneal
air.
Osteoarthritic changes left hip.
Diffuse osseous demineralization.
IMPRESSION: No acute abdominal findings.
Enlargement of cardiac silhouette post pacemaker and CABG.

## 2014-12-25 IMAGING — CT CT HEAD W/O CM
1 series · 16 of 30 positions shown, 20 images · non-contrast
Comparison: None

CLINICAL DATA: Mental status change, history hypertension,
diabetes, seizures, COPD, vascular disease

CT HEAD WITHOUT CONTRAST
TECHNIQUE: Contiguous axial images were obtained from the base of
the skull through the vertex without contrast.

[Series 2: headseq 4.8 h37s · axial · 0.51mm/px · z∈[+99,+267]mm · 16 of 36 slices shown, 20 images]
[im 2/36  brain]
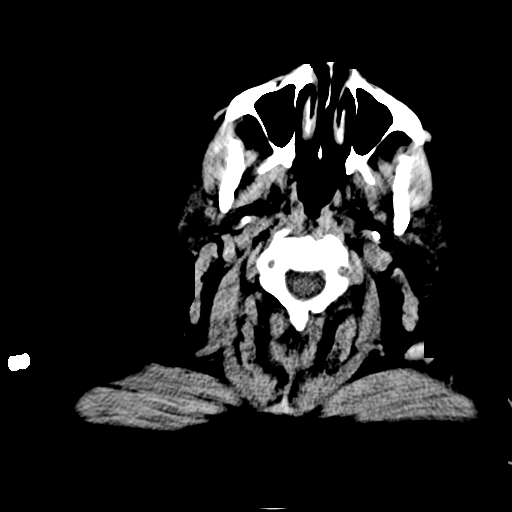
[im 2/36  bone]
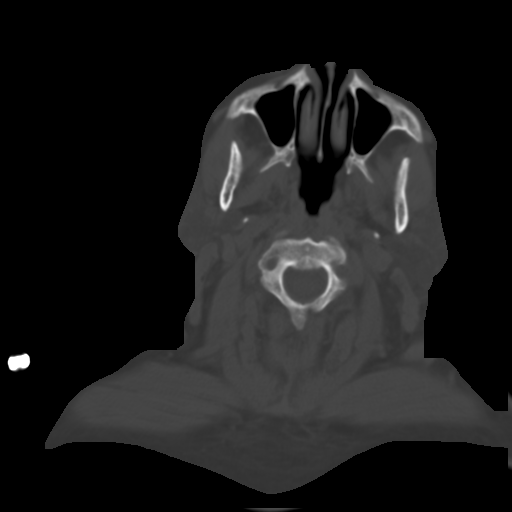
[im 4/36  brain]
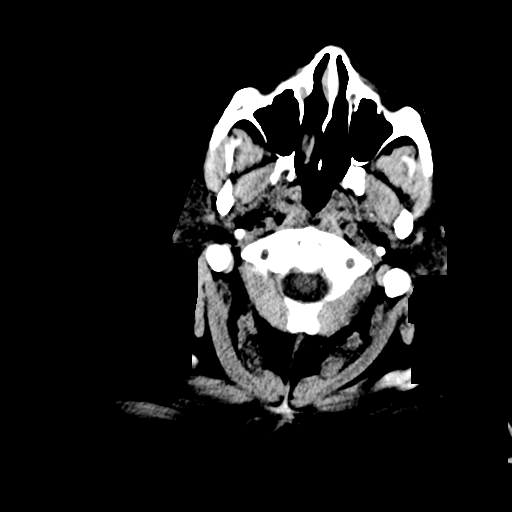
[im 7/36  brain]
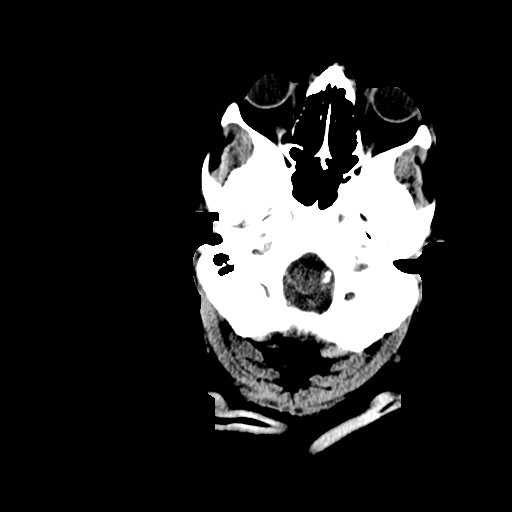
[im 9/36  brain]
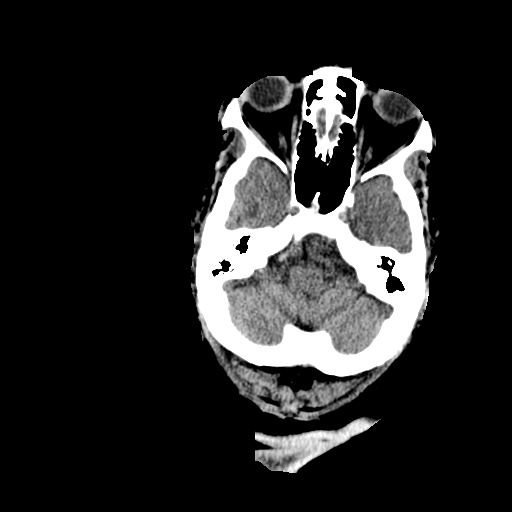
[im 10/36  brain]
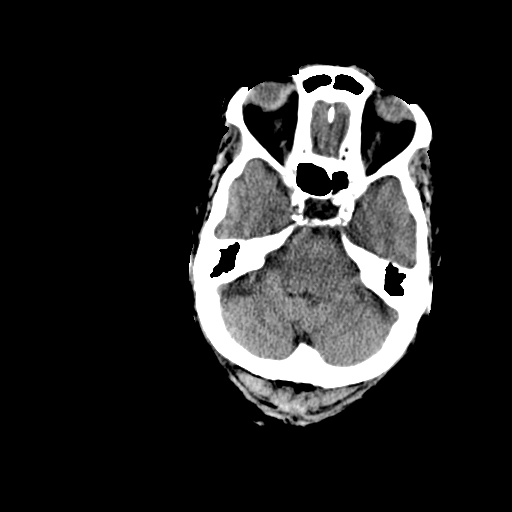
[im 10/36  bone]
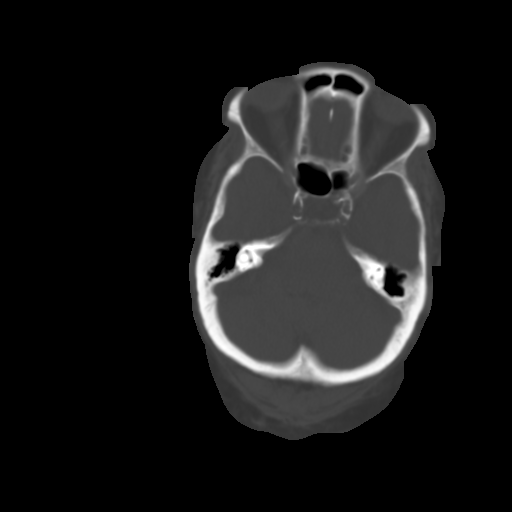
[im 13/36  brain]
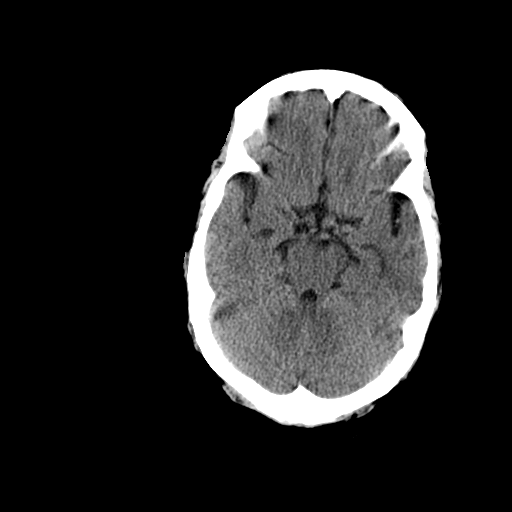
[im 15/36  brain]
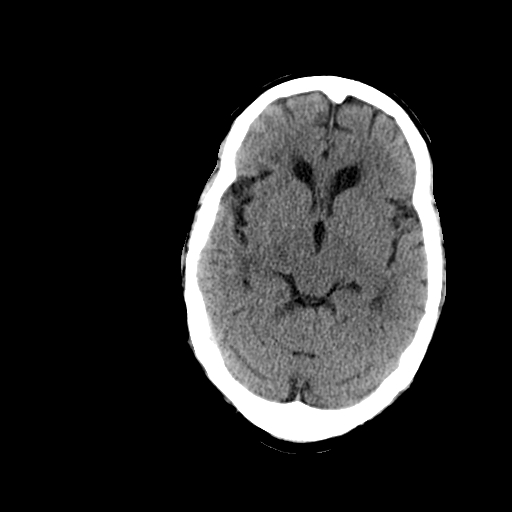
[im 17/36  brain]
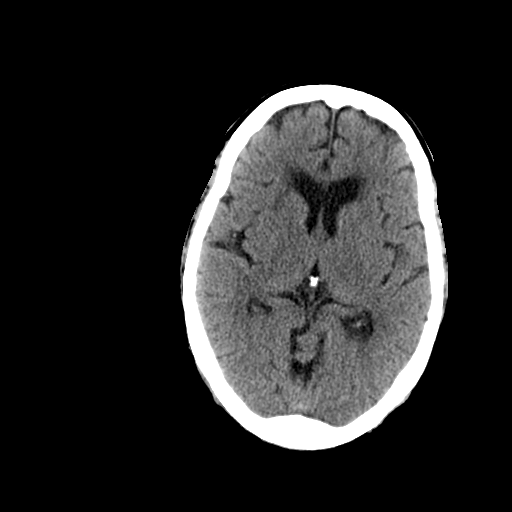
[im 19/36  brain]
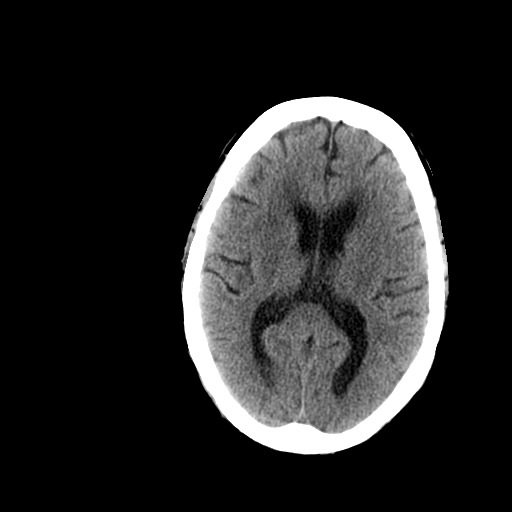
[im 19/36  bone]
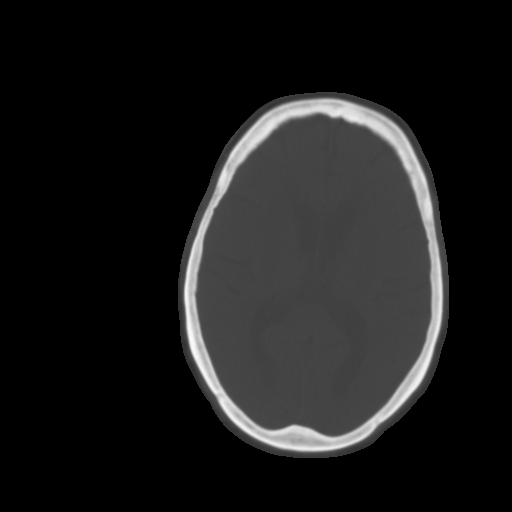
[im 21/36  brain]
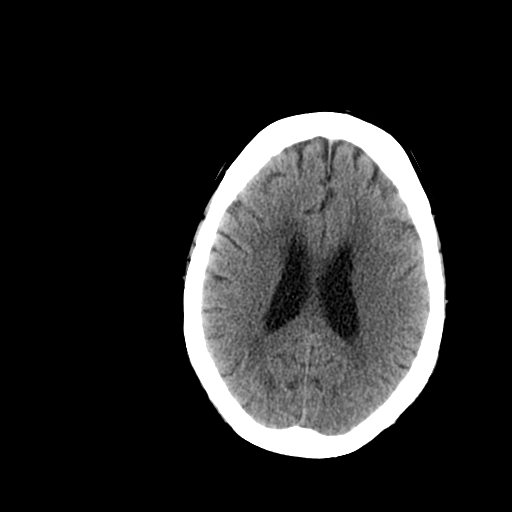
[im 23/36  brain]
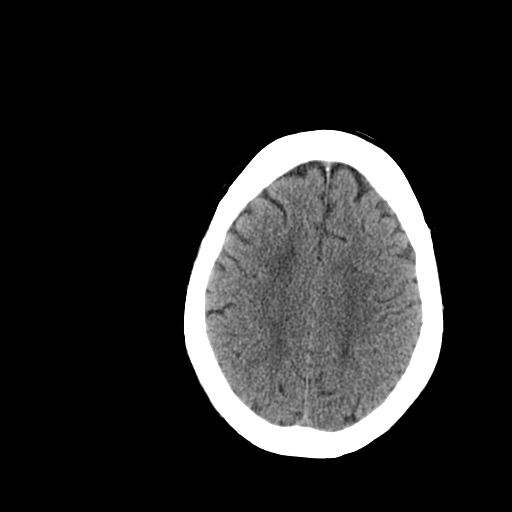
[im 26/36  brain]
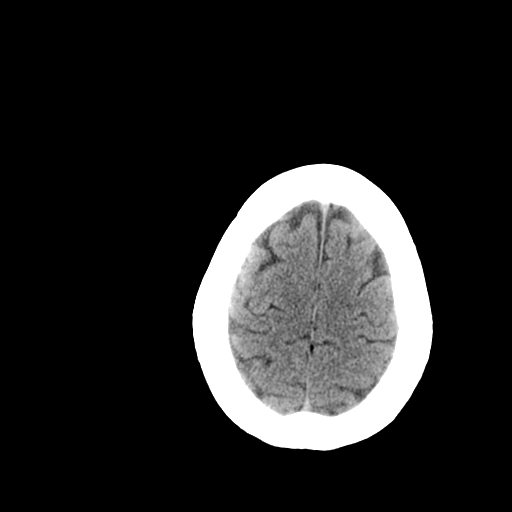
[im 27/36  brain]
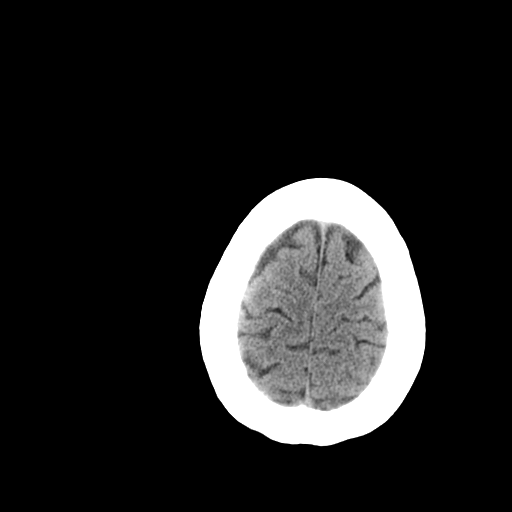
[im 27/36  bone]
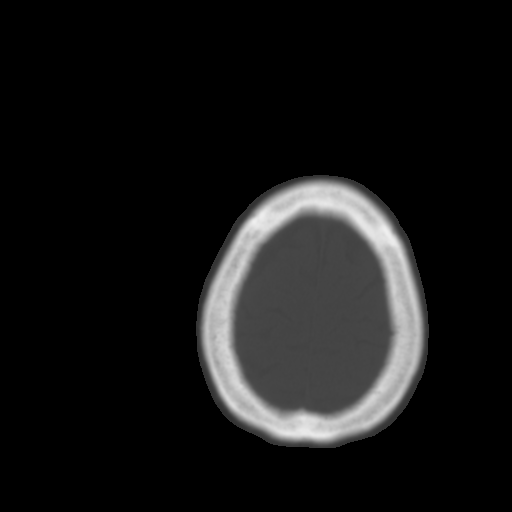
[im 29/36  brain]
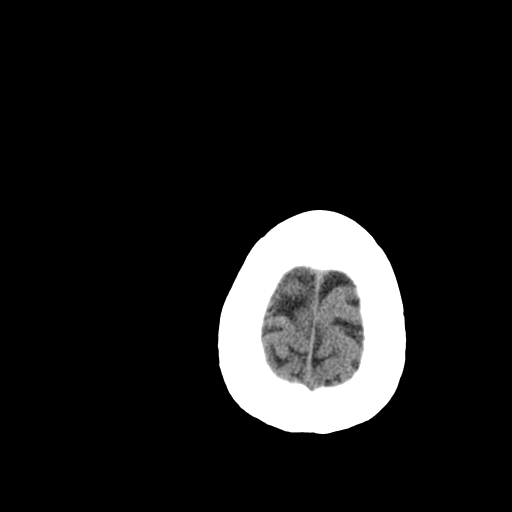
[im 32/36  brain]
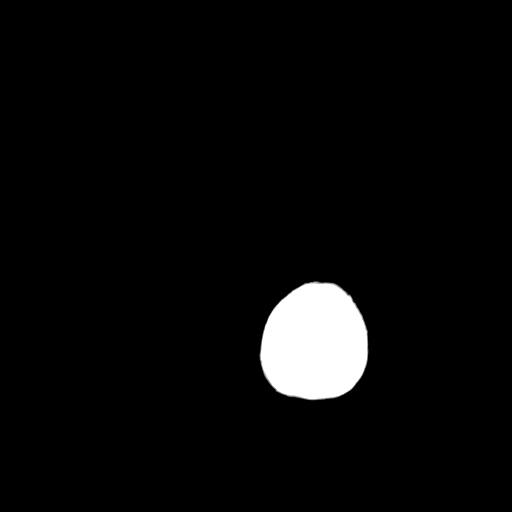
[im 34/36  brain]
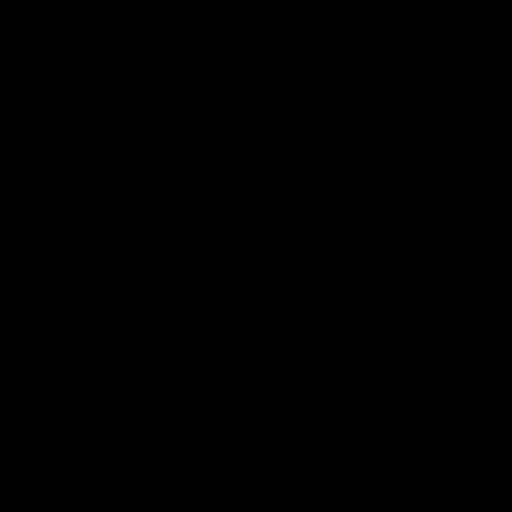

[16 of 30 positions shown; findings below may reference images not displayed]

FINDINGS: Generalized atrophy.
Normal ventricular morphology.
No midline shift or mass effect.
Small vessel chronic ischemic changes of deep cerebral white
matter.
No intracranial hemorrhage, mass lesion, or evidence of acute
infarction.
No extra-axial fluid collections.
Atherosclerotic calcifications at skull base.
No acute bone or sinus abnormality.
IMPRESSION: Atrophy with small vessel chronic ischemic changes of deep cerebral
white matter.
No acute intracranial abnormalities.

## 2014-12-26 IMAGING — CT CT ABD-PELV W/ CM
2 of 4 series · 16 of 46 positions shown, 18 images · IV contrast (Omnipaque 300)
Comparison: 09/16/2012

CLINICAL DATA: Abdominal pain.  Confusion.  Decreased appetite and
epigastric pain.  Constipation.

CT ABDOMEN AND PELVIS WITH CONTRAST
TECHNIQUE: Multidetector CT imaging of the abdomen and pelvis was
performed following the standard protocol during bolus
administration of intravenous contrast.
Contrast: 50mL OMNIPAQUE IOHEXOL 300 MG/ML  SOLN, 100mL OMNIPAQUE
IOHEXOL 300 MG/ML  SOLN

[Series 2: abd_pel_with 5.0 b40f · axial · 0.75mm/px · z∈[-412,-62]mm · 13 of 78 slices shown, 15 images]
[im 4/78  soft-tissue]
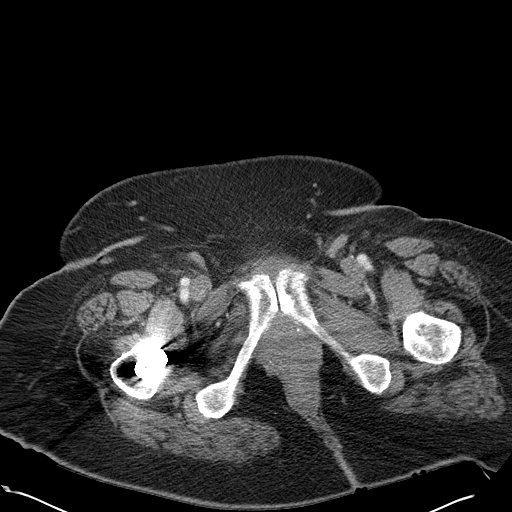
[im 4/78  bone]
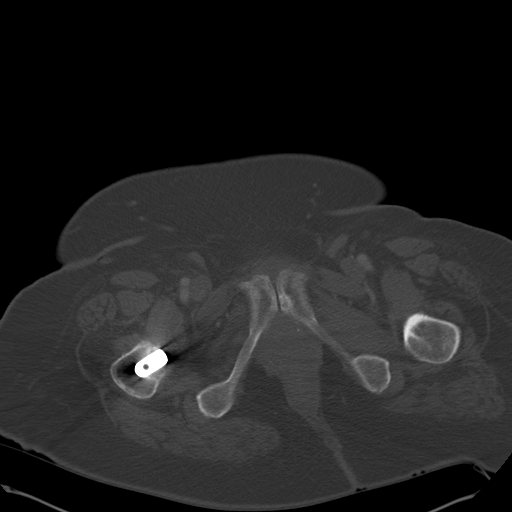
[im 12/78  soft-tissue]
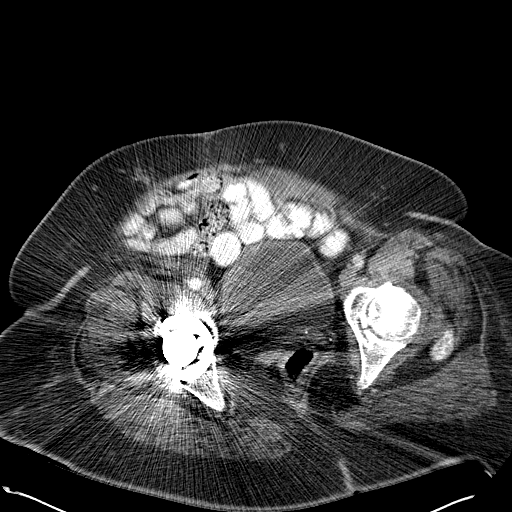
[im 15/78  soft-tissue]
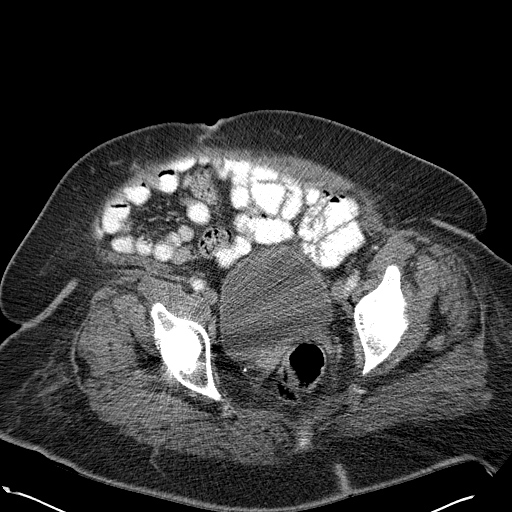
[im 23/78  soft-tissue]
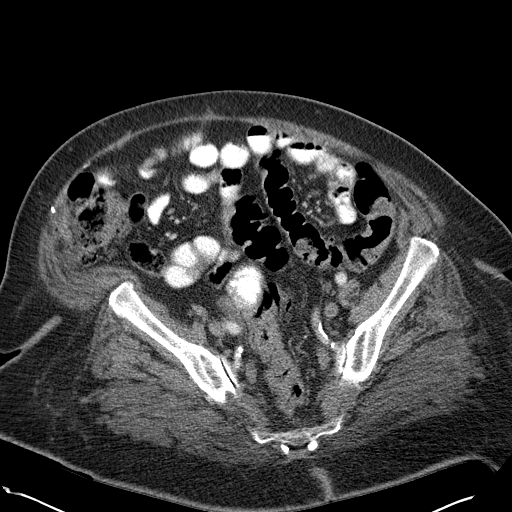
[im 26/78  soft-tissue]
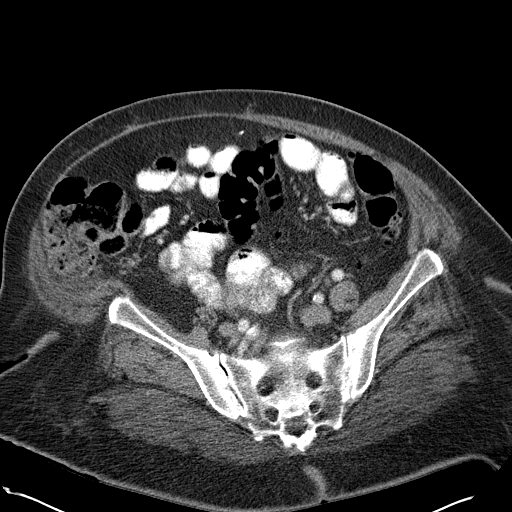
[im 34/78  soft-tissue]
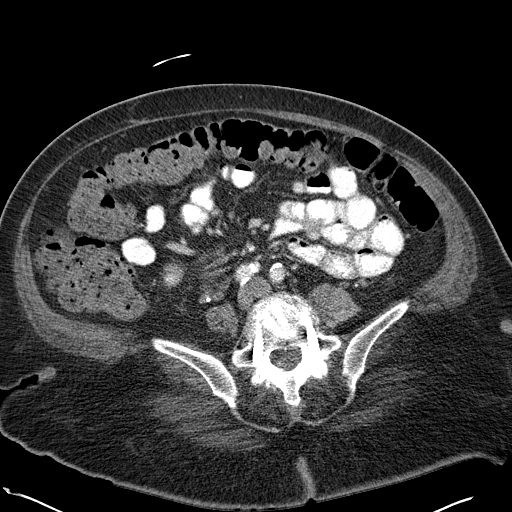
[im 41/78  soft-tissue]
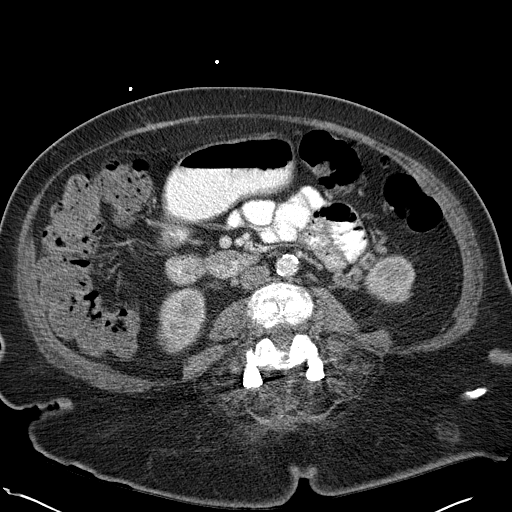
[im 45/78  soft-tissue]
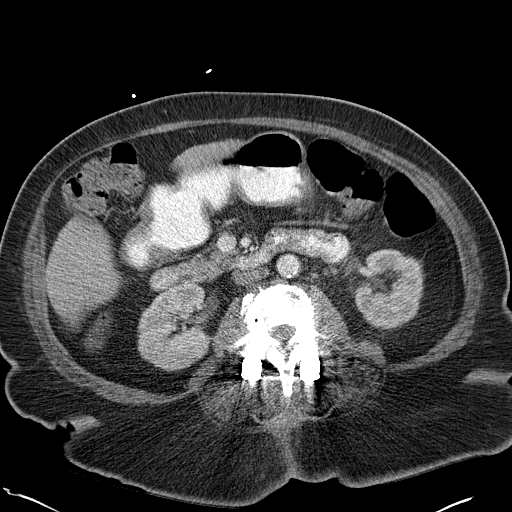
[im 52/78  soft-tissue]
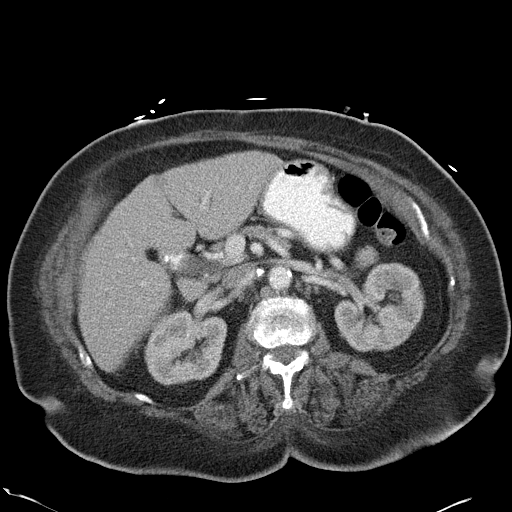
[im 52/78  bone]
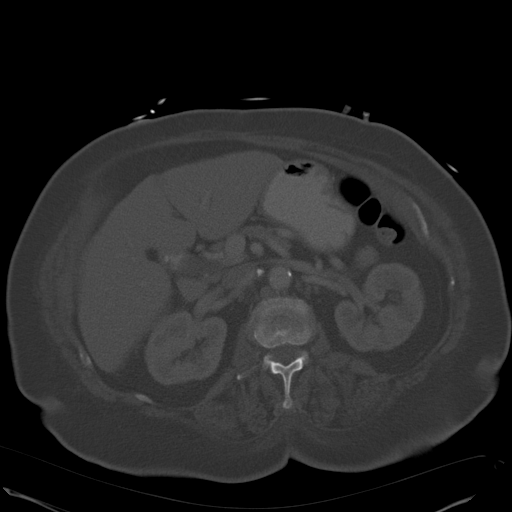
[im 56/78  soft-tissue]
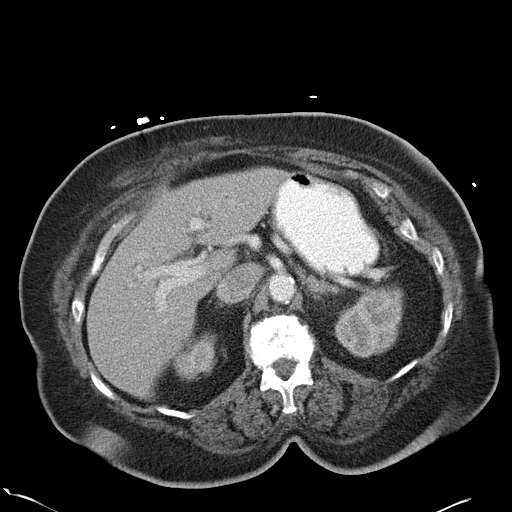
[im 63/78  soft-tissue]
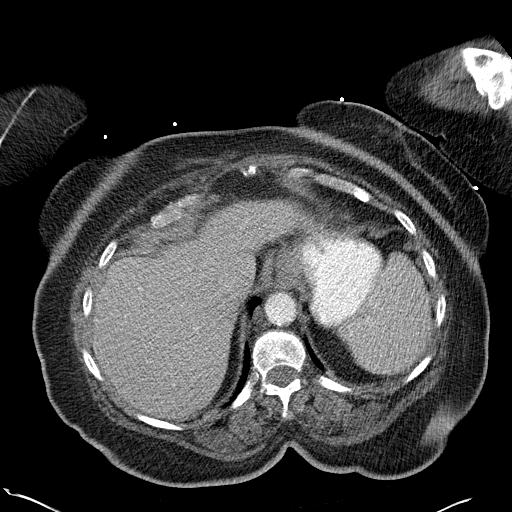
[im 67/78  soft-tissue]
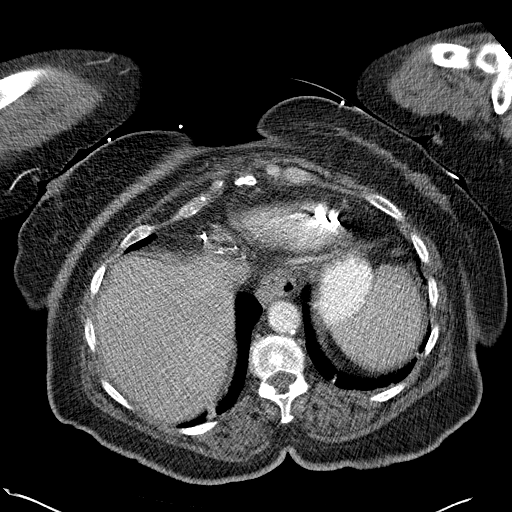
[im 74/78  soft-tissue]
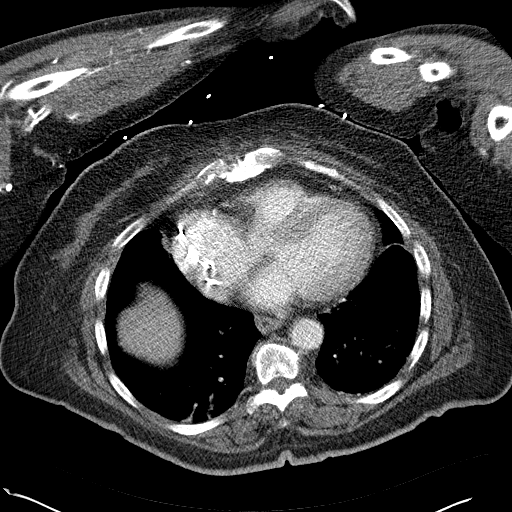

[Series 4: abd_pel_with 3.0 spo · coronal · 0.79mm/px · 3 of 113 slices shown]
[im 38/113  soft-tissue]
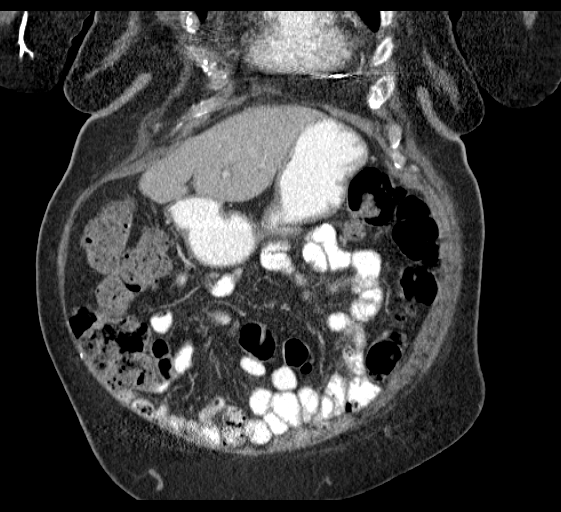
[im 50/113  soft-tissue]
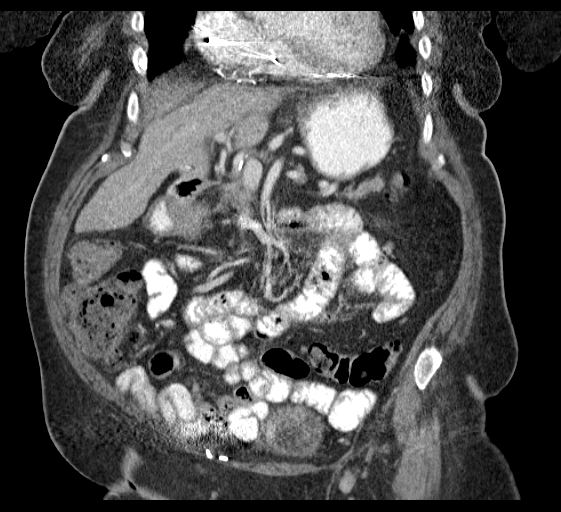
[im 63/113  soft-tissue]
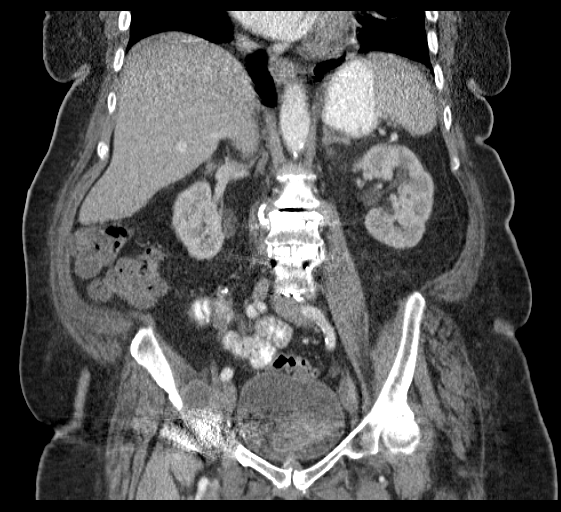

[16 of 46 positions shown; findings below may reference images not displayed]

FINDINGS: Atelectasis or fibrosis in the lung bases.  Cardiac
enlargement.

Surgical absence of the gallbladder.  Small esophageal hiatal
hernia.  Gastric wall is not thickened.  Small bowel are not
abnormally distended.  No bowel wall thickening.  Stool filled
colon without distension or wall thickening.

The liver, spleen, pancreas, adrenal glands, kidneys, inferior vena
cava, and retroperitoneal lymph nodes are unremarkable.
Calcification of abdominal aorta without aneurysm.  No free fluid
or free air in the abdomen.  Prominent visceral adipose tissues.
Umbilical/periumbilical hernia containing fat.  Scattered surgical
clips in the abdominal wall.

Pelvis:  Surgical clips in the right groin/inguinal region.
Visualization of portions of the pelvis is limited due to streak
artifact from right hip prosthesis.  The bladder wall appears
mildly thickened.  This could be due to cystitis.  The uterus
appears to be surgically absent.  No abnormal adnexal masses.
Stool filled rectosigmoid colon.  No evidence of diverticulitis.
The appendix is not identified.  No free or loculated pelvic fluid
collections.  No significant pelvic lymphadenopathy.  Postoperative
and degenerative changes in the lumbar spine.  There is anterior
subluxation of L4 on L5 but this appears stable since previous
study.  No destructive bone lesions appreciated.  Degenerative disc
disease at L2-3.
IMPRESSION: Suggestion of mild bladder wall thickening which could represent
cystitis.  No evidence of bowel obstruction.  No acute processes
otherwise identified.

## 2014-12-27 IMAGING — CT CT HEAD W/O CM
1 of 3 series · 13 of 30 positions shown, 17 images · non-contrast
Comparison: 06/28/2013

CLINICAL DATA: Stroke

CT HEAD WITHOUT CONTRAST
TECHNIQUE: Contiguous axial images were obtained from the base of
the skull through the vertex without contrast.

[Series 2: headtrauma 4.8 h37s · axial · 0.48mm/px · z∈[+65,+215]mm · 13 of 36 slices shown, 17 images]
[im 3/36  brain]
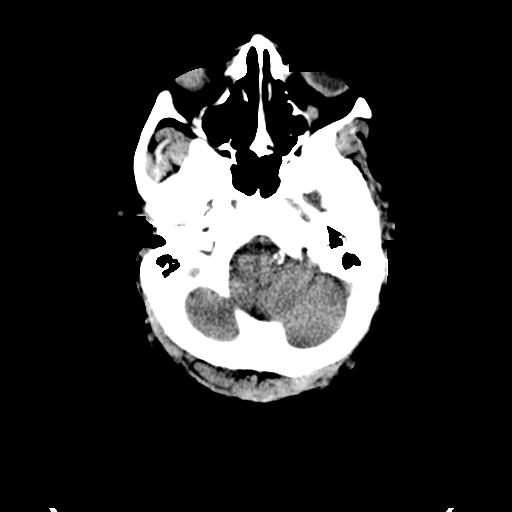
[im 3/36  bone]
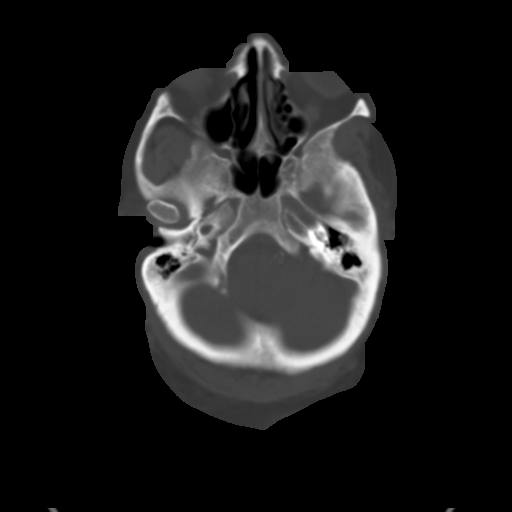
[im 6/36  brain]
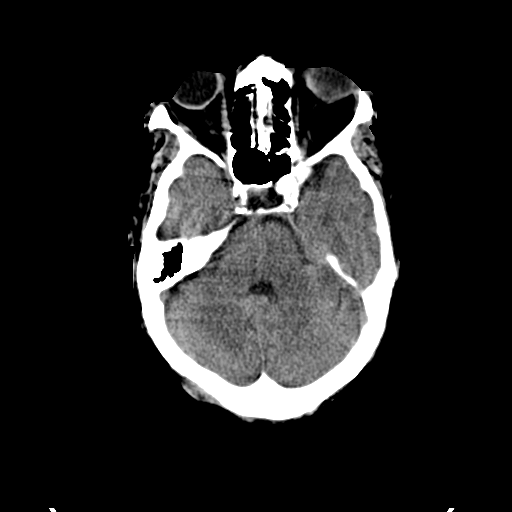
[im 8/36  brain]
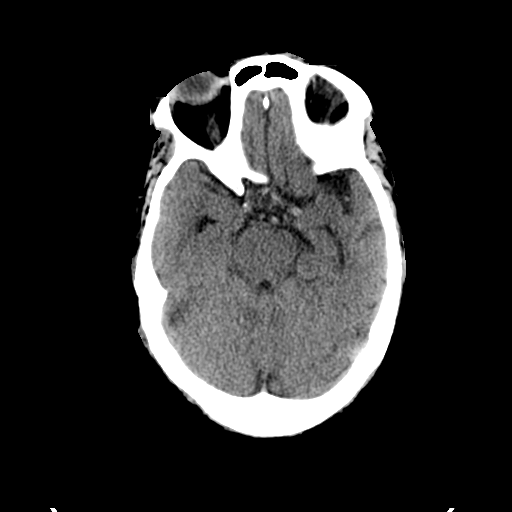
[im 11/36  brain]
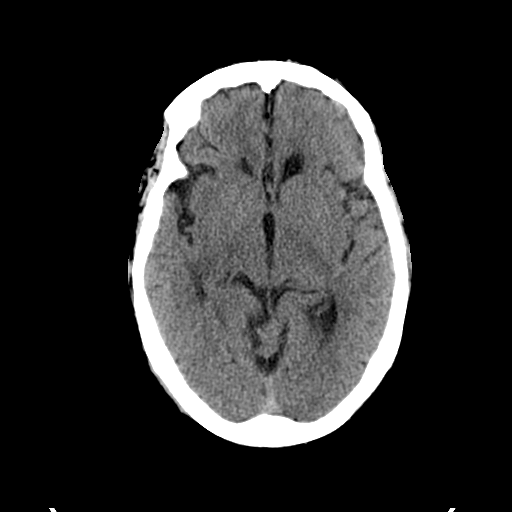
[im 13/36  brain]
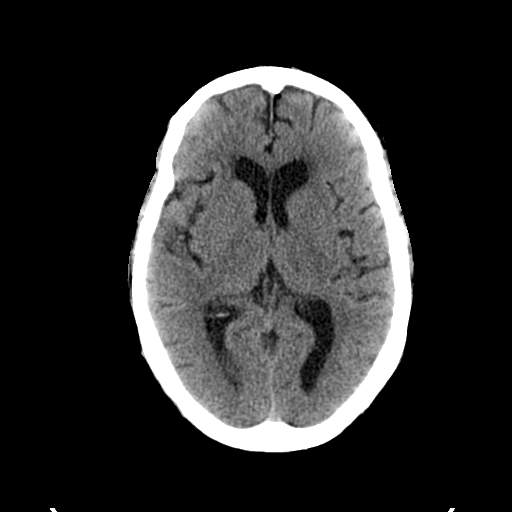
[im 13/36  bone]
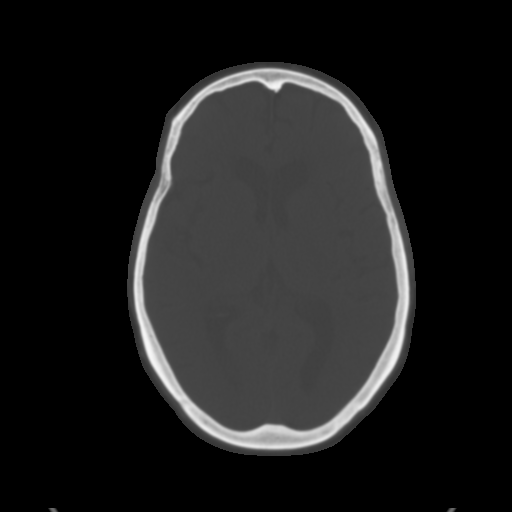
[im 16/36  brain]
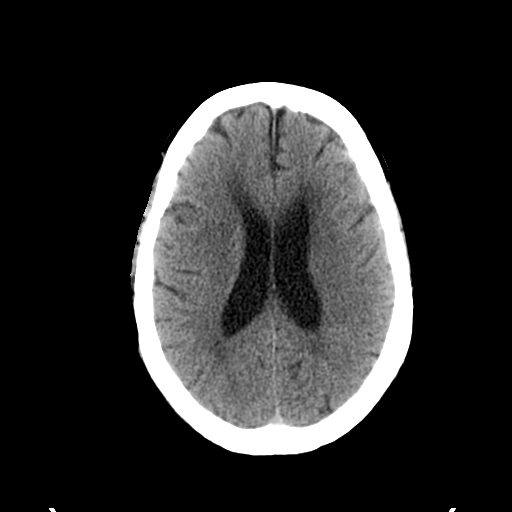
[im 18/36  brain]
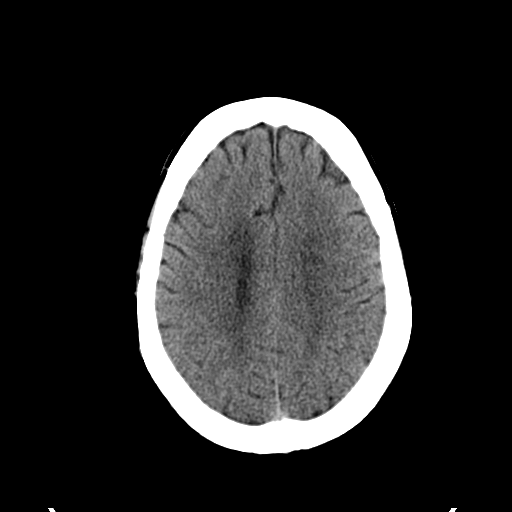
[im 21/36  brain]
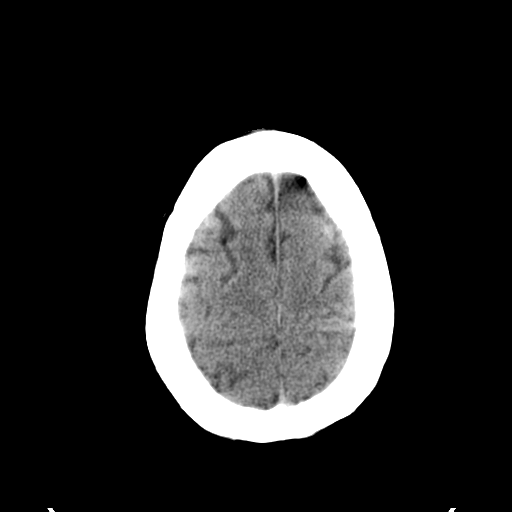
[im 23/36  brain]
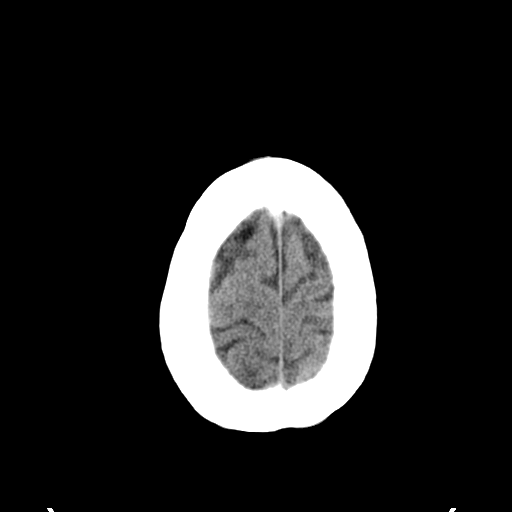
[im 23/36  bone]
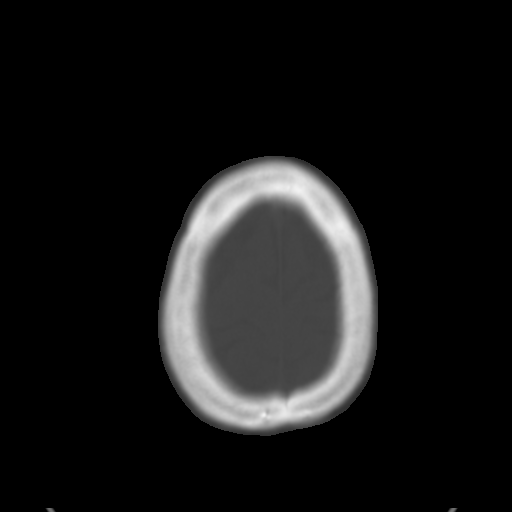
[im 26/36  brain]
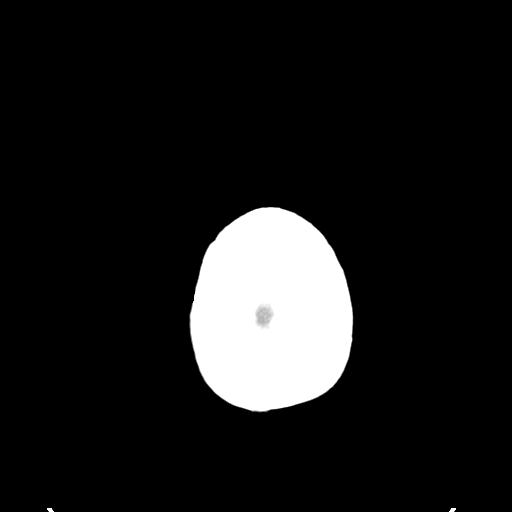
[im 28/36  brain]
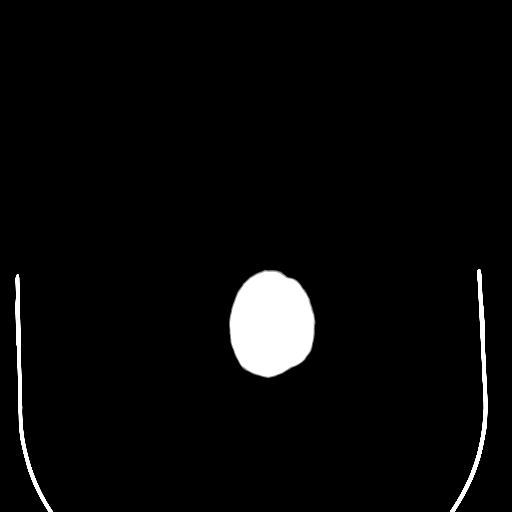
[im 31/36  brain]
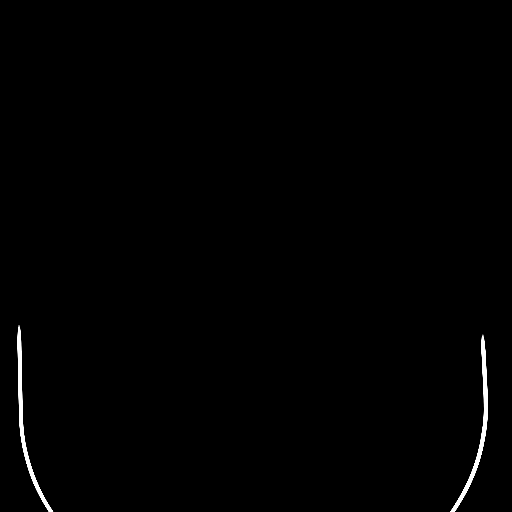
[im 33/36  brain]
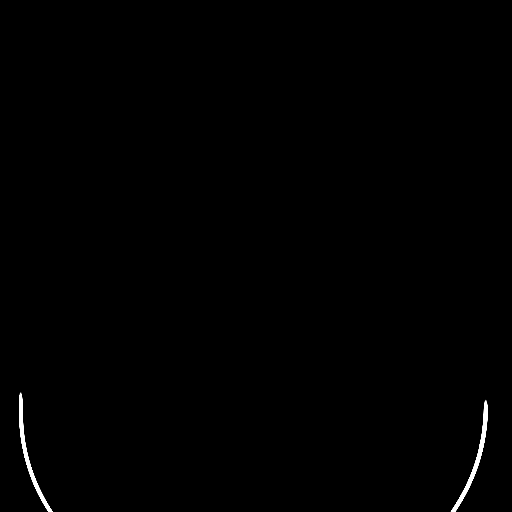
[im 33/36  bone]
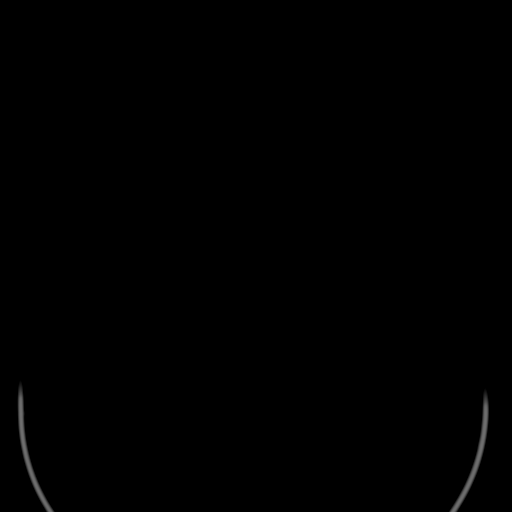

[13 of 30 positions shown; findings below may reference images not displayed]

FINDINGS: Mild chronic ischemic changes in the periventricular
white matter.  Mild global atrophy appropriate to age.  No mass
effect, midline shift, or acute intracranial hemorrhage.  Cranium
is intact.  Mastoid air cells are clear.
IMPRESSION: No acute intracranial pathology.  Chronic ischemic changes and
atrophy are noted.

## 2014-12-28 IMAGING — US US CAROTID DUPLEX BILAT
1 series · 13 of 24 positions shown · non-contrast
Comparison: None.

CLINICAL DATA: Hypertension, diabetes, elevated cholesterol

BILATERAL CAROTID DUPLEX ULTRASOUND
TECHNIQUE: Gray scale imaging, color Doppler and duplex ultrasound
were performed of bilateral carotid and vertebral arteries in the
neck.

[Series 1: us carotid duplex bilat · 0.07mm/px · 13 of 104 slices shown]
[im 1/104]
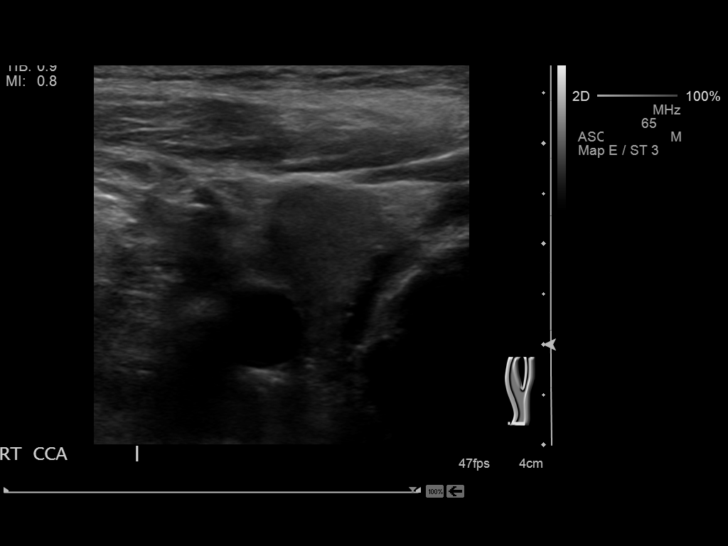
[im 9/104]
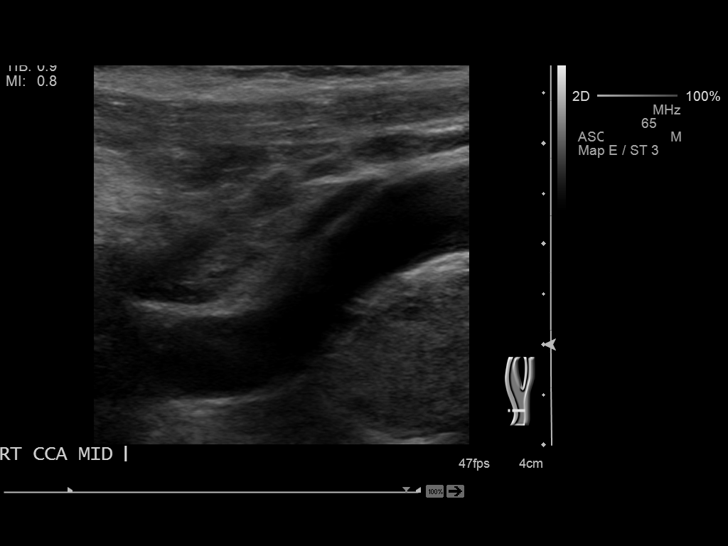
[im 18/104]
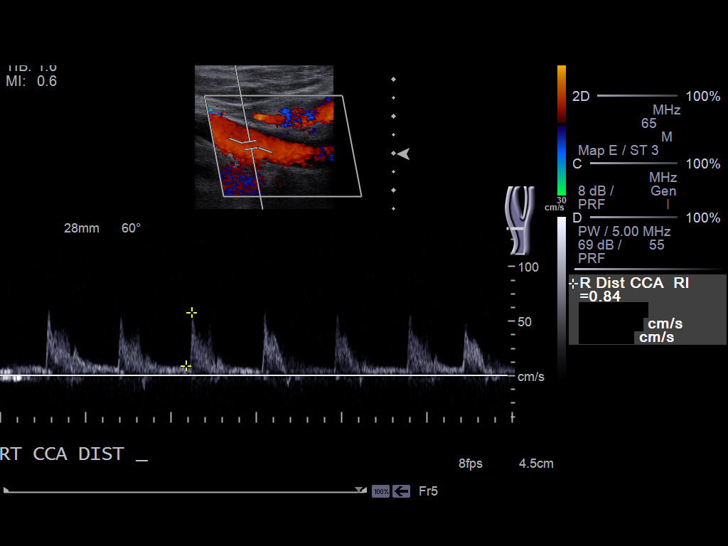
[im 27/104]
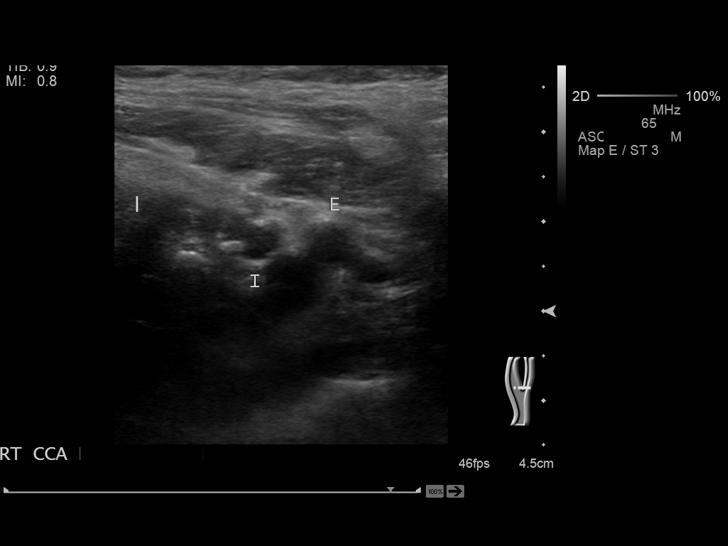
[im 36/104]
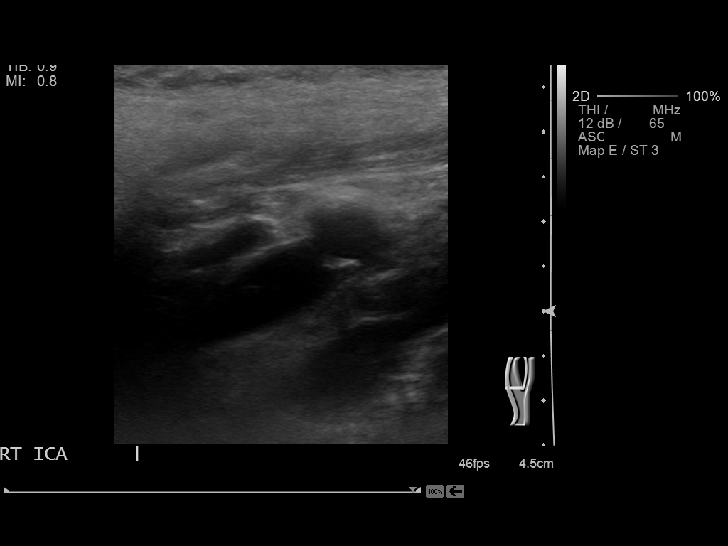
[im 45/104]
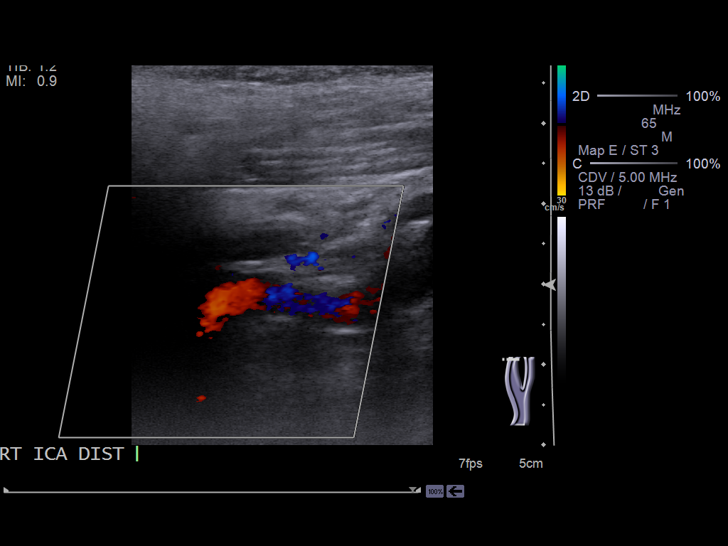
[im 54/104]
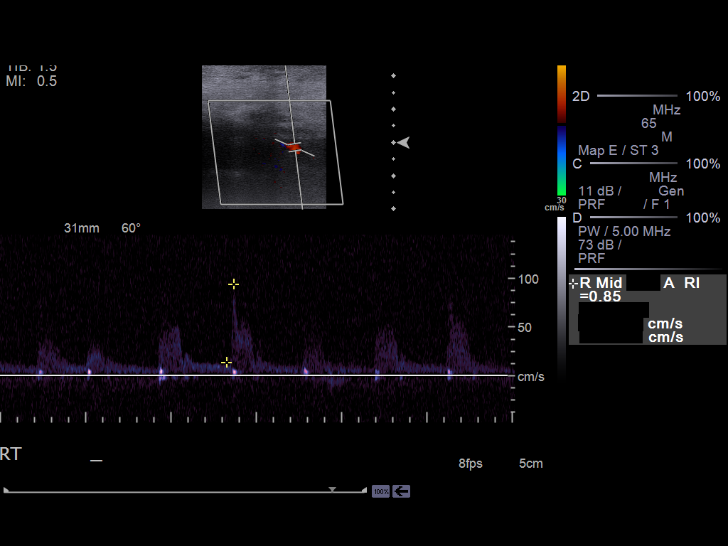
[im 59/104]
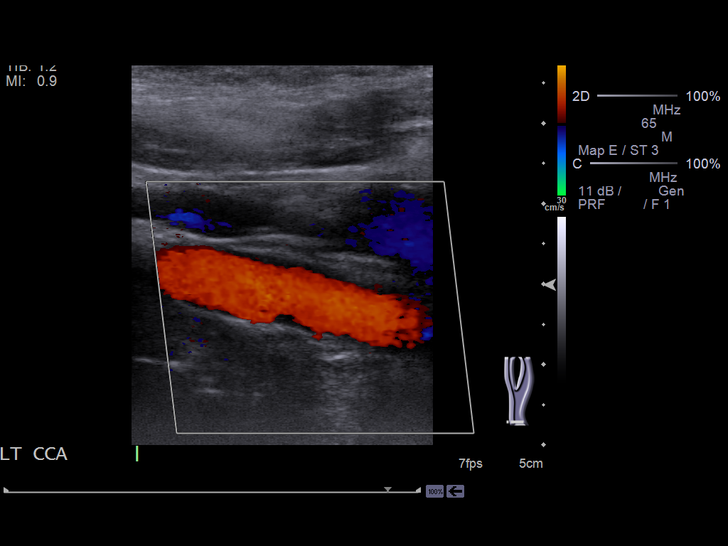
[im 68/104]
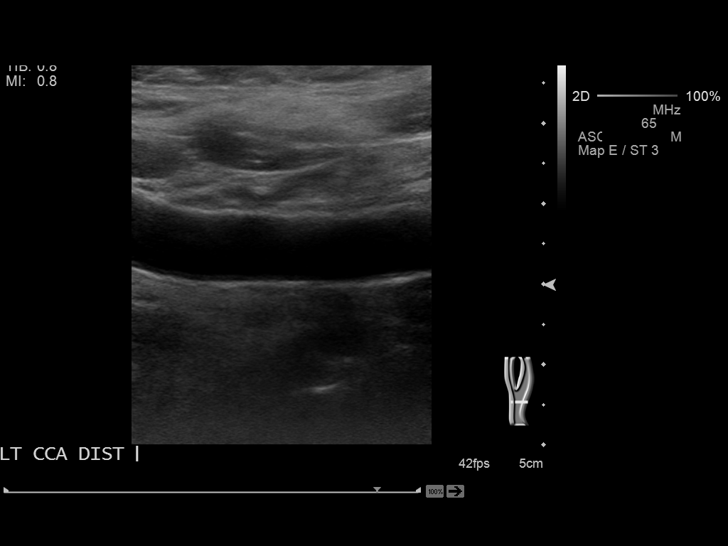
[im 77/104]
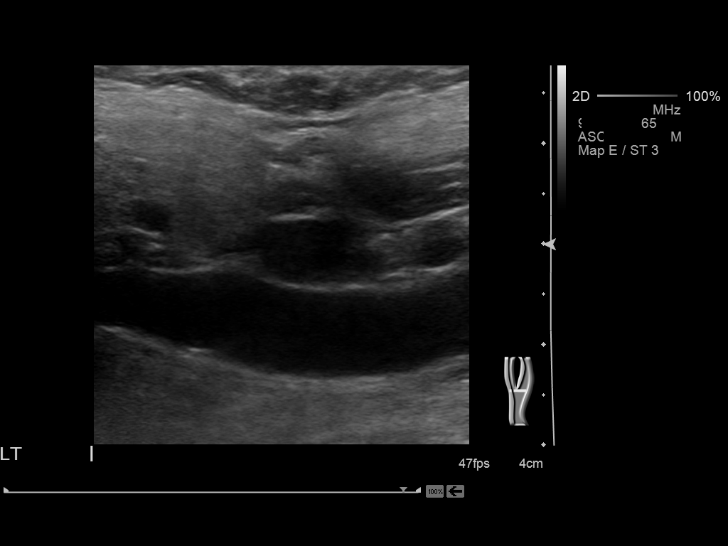
[im 86/104]
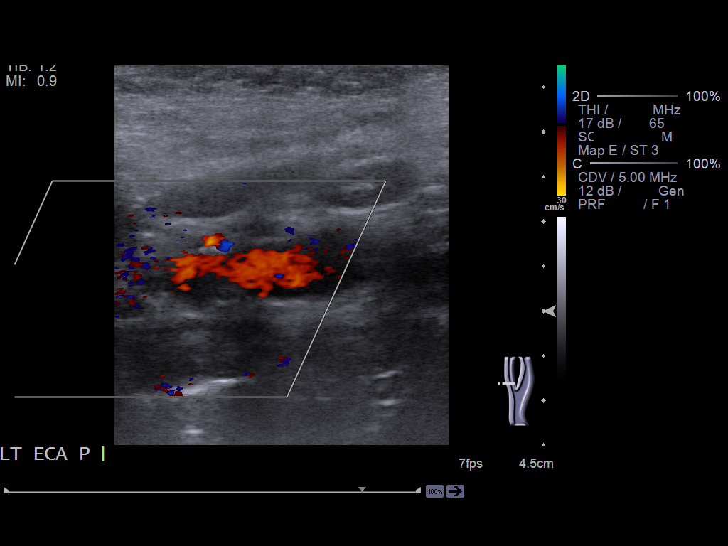
[im 95/104]
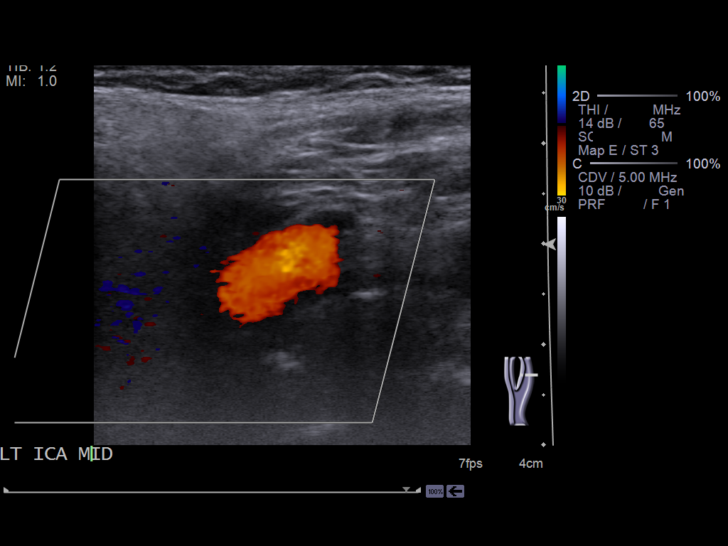
[im 104/104]
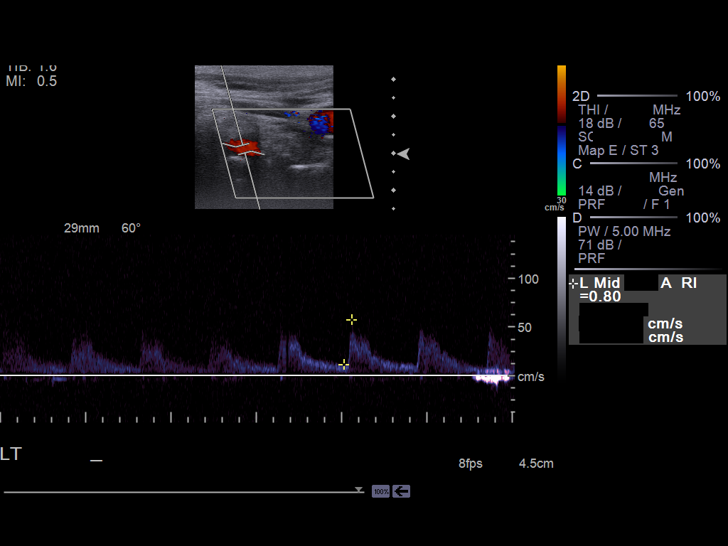

[13 of 24 positions shown; findings below may reference images not displayed]

Criteria:  Quantification of carotid stenosis is based on velocity
parameters that correlate the residual internal carotid diameter
with NASCET-based stenosis levels, using the diameter of the distal
internal carotid lumen as the denominator for stenosis measurement.

The following velocity measurements were obtained:

                 PEAK SYSTOLIC/END DIASTOLIC
RIGHT
ICA:                        100/12cm/sec
CCA:                        63/6cm/sec
SYSTOLIC ICA/CCA RATIO:
DIASTOLIC ICA/CCA RATIO:
ECA:                        96cm/sec

LEFT
ICA:                        65/12cm/sec
CCA:                        81/15cm/sec
SYSTOLIC ICA/CCA RATIO:
DIASTOLIC ICA/CCA RATIO:
ECA:                        70cm/sec
FINDINGS: RIGHT CAROTID ARTERY: Minor right carotid system plaque formation.
No hemodynamically significant right ICA stenosis, velocity
elevation, turbulent flow.

RIGHT VERTEBRAL ARTERY:  Antegrade

LEFT CAROTID ARTERY: Similar minor plaque formation.  No
hemodynamically significant left ICA stenosis, velocity elevation,
or turbulent flow.

LEFT VERTEBRAL ARTERY:  Antegrade
IMPRESSION: Minor carotid plaque formation.  No hemodynamically significant ICA
stenosis by ultrasound

## 2015-01-10 IMAGING — CR DG CHEST 1V PORT
1 series · 1 of 1 positions shown · non-contrast
Comparison: 06/28/2013

CLINICAL DATA: PICC line

PORTABLE CHEST - 1 VIEW

[portable]
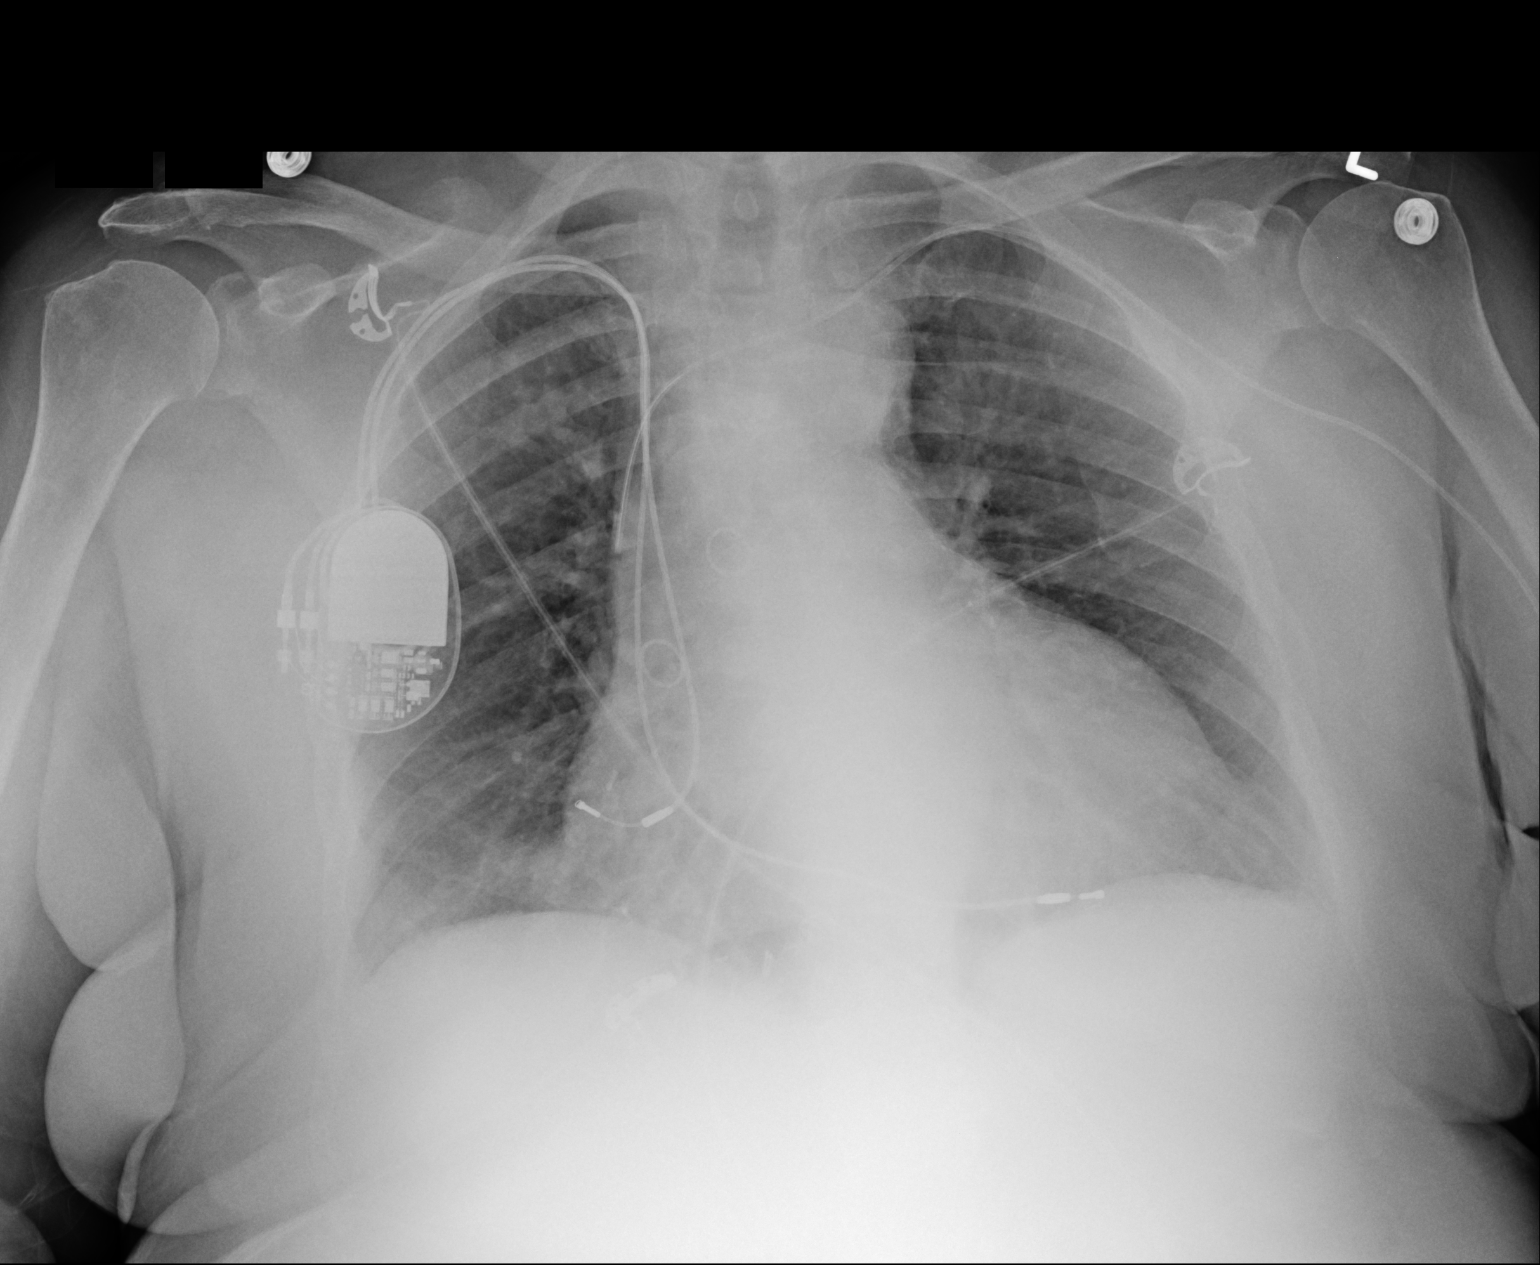

[1 of 1 positions shown; findings below may reference images not displayed]

FINDINGS: Left arm PICC tip in the SVC in good position.

Cardiac enlargement with mild vascular congestion.  Negative for
edema.
IMPRESSION: PICC tip in the mid SVC.

Mild vascular congestion without edema

## 2015-01-13 IMAGING — CR DG CHEST 1V PORT
1 series · 1 of 1 positions shown · non-contrast
Comparison: 07/14/2013

CLINICAL DATA: Post left subclavian Port-A-Cath placement.

PORTABLE CHEST - 1 VIEW

[portable]
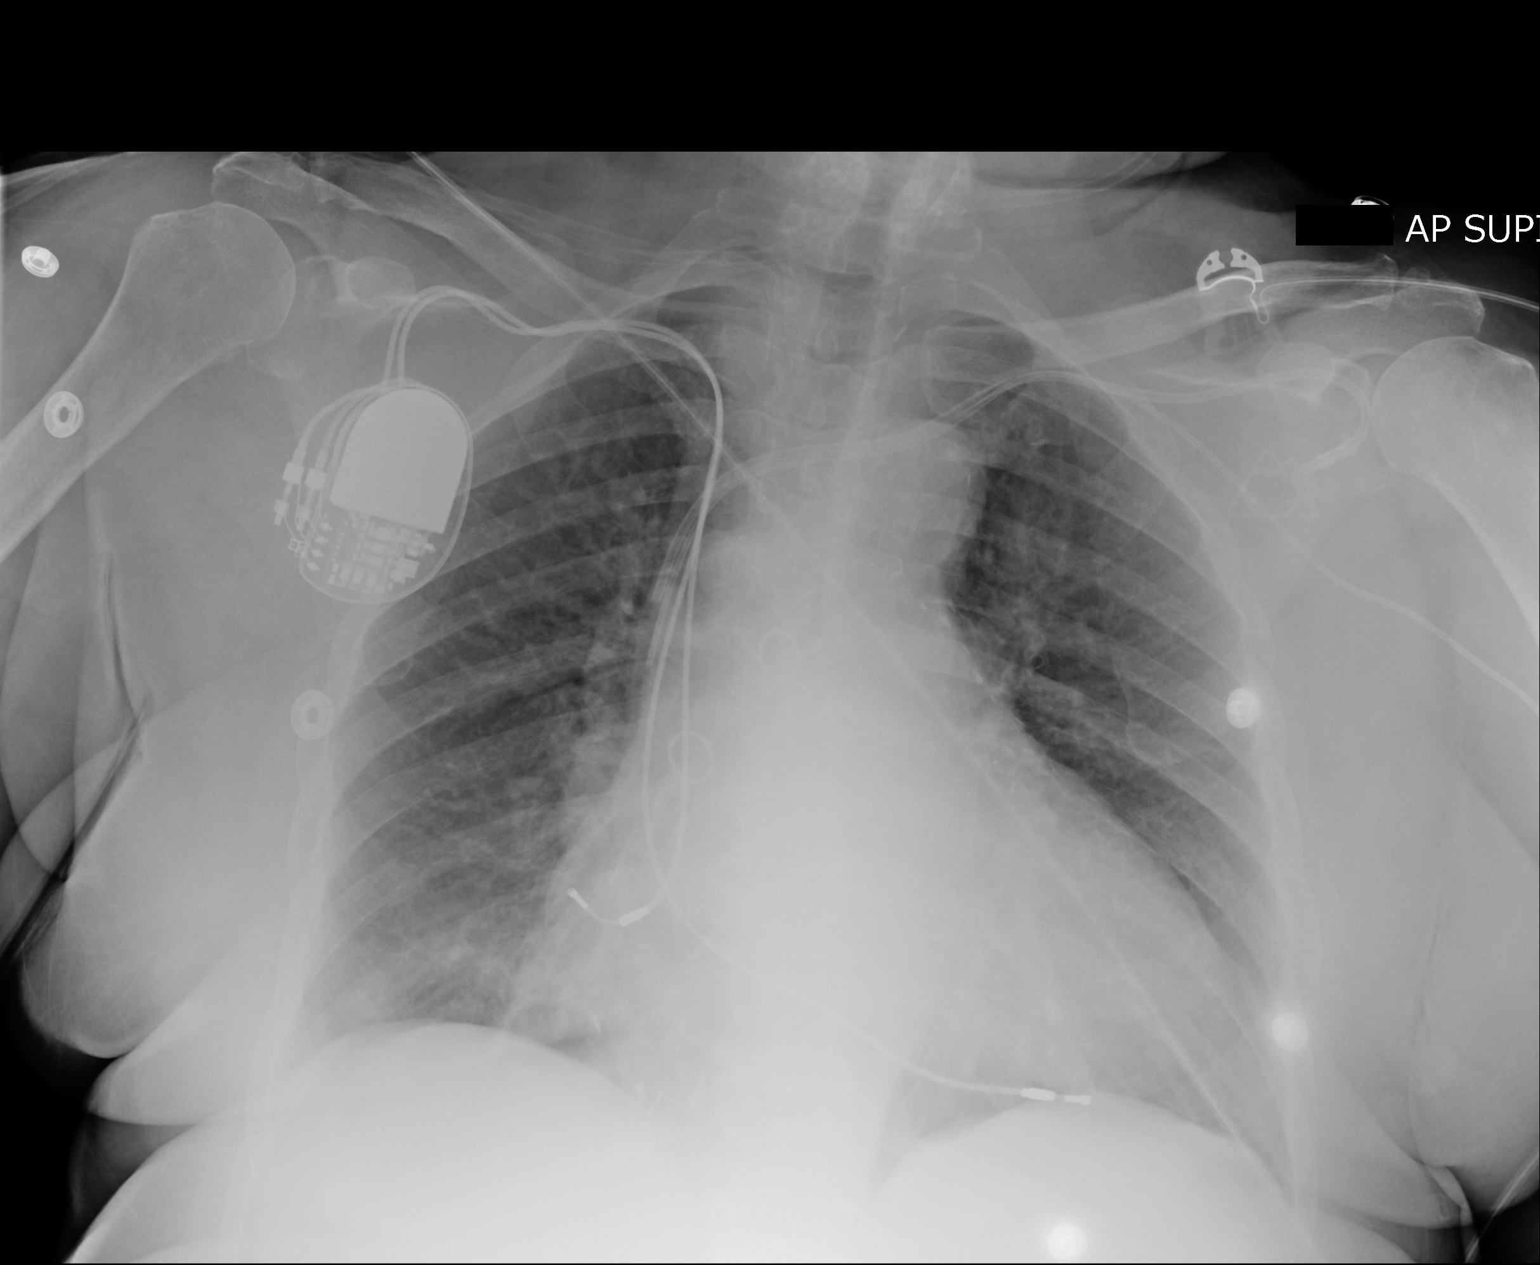

[1 of 1 positions shown; findings below may reference images not displayed]

FINDINGS: Exam demonstrates no change in a left-sided PICC line.
Dual lead right-sided cardiac pacemaker unchanged.  There has been
interval placement of a left subclavian Port-A-Cath with tip over
the region of the SVC at the level of the carina.  Lungs are
adequately inflated without focal consolidation or effusion.
Continued minimal prominence of the perihilar vasculature as cannot
exclude mild vascular congestion.  There is no evidence of
pneumothorax.  There is mild stable cardiomegaly. Remainder of the
exam is unchanged.
IMPRESSION: Suggestion of minimal vascular congestion and mild cardiomegaly
unchanged.

Tubes and lines as described.  Interval placement of a left
subclavian Port-A-Cath with tip over the SVC.

## 2015-01-18 IMAGING — CT CT HEAD W/O CM
1 of 3 series · 16 of 30 positions shown, 20 images · non-contrast
Comparison: Head CT 06/30/2013.

CLINICAL DATA: Increased confusion.

CT HEAD WITHOUT CONTRAST
TECHNIQUE: Contiguous axial images were obtained from the base of
the skull through the vertex without contrast.

[Series 2: headseq 4.8 h37s · axial · 0.43mm/px · z∈[+103,+248]mm · 16 of 36 slices shown, 20 images]
[im 3/36  brain]
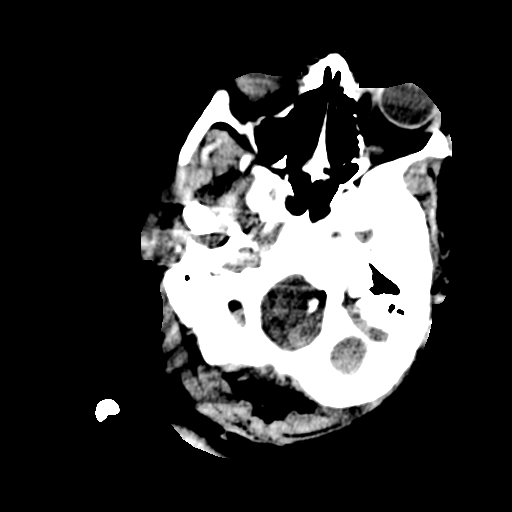
[im 3/36  bone]
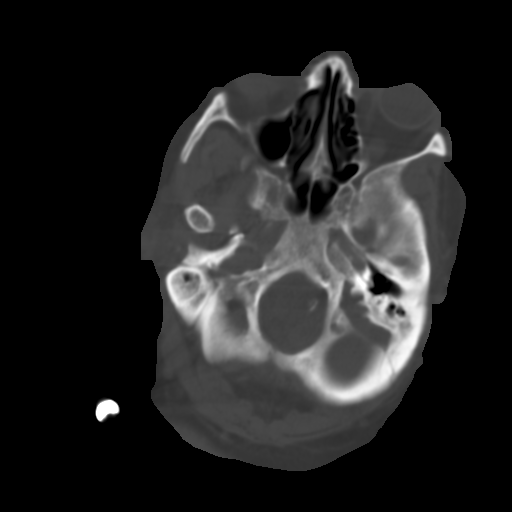
[im 5/36  brain]
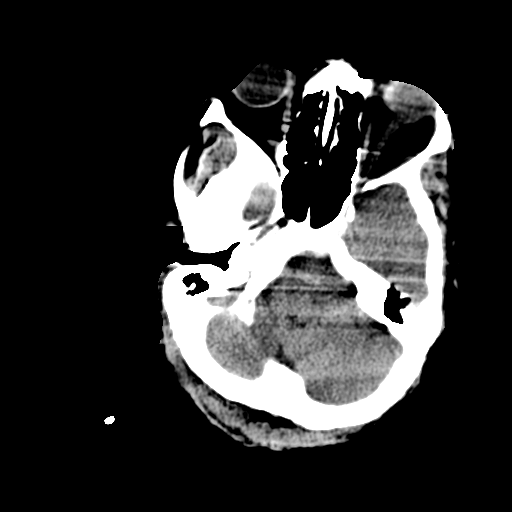
[im 7/36  brain]
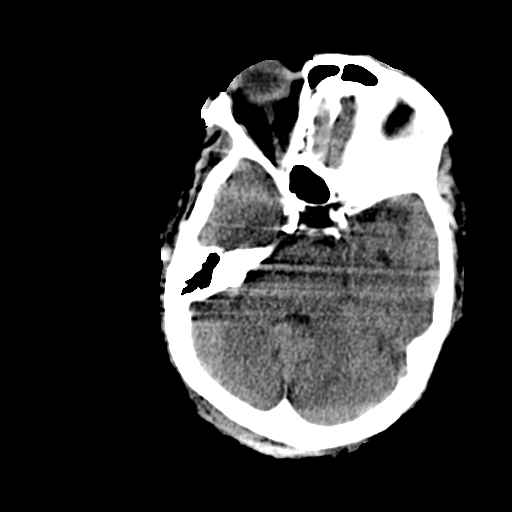
[im 9/36  brain]
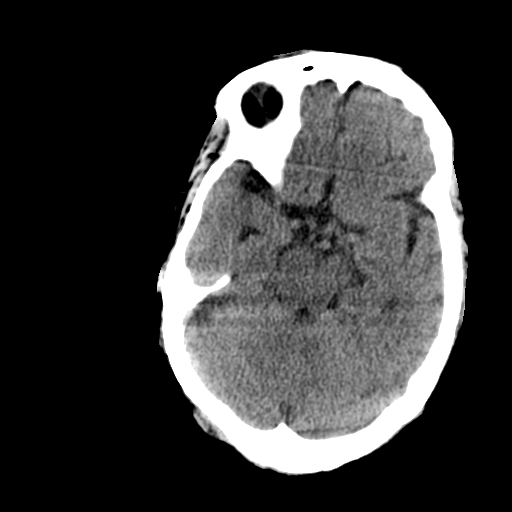
[im 11/36  brain]
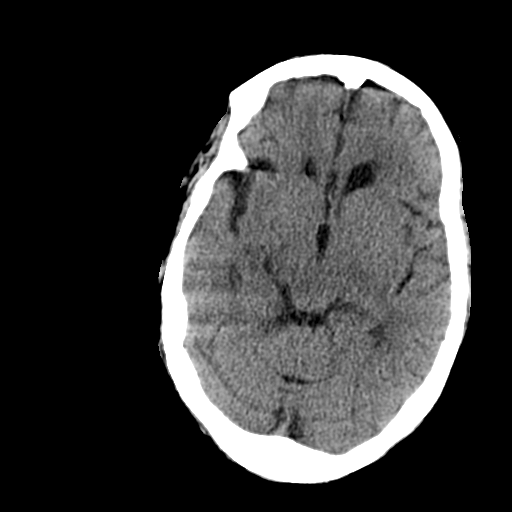
[im 11/36  bone]
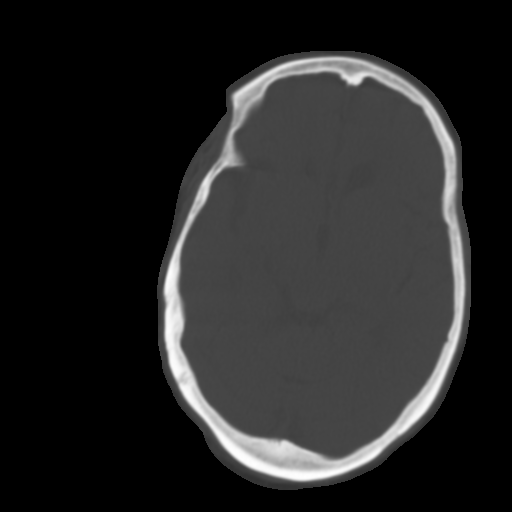
[im 13/36  brain]
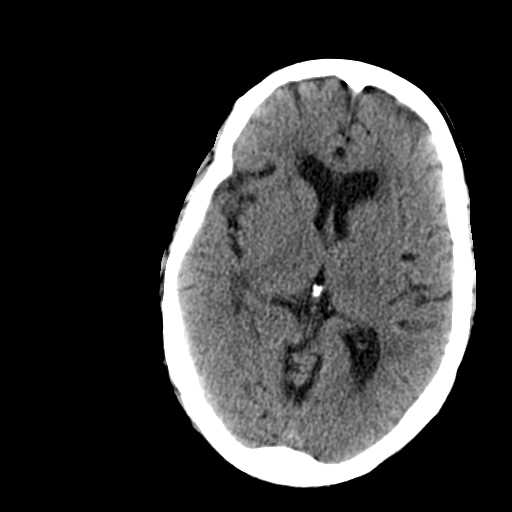
[im 15/36  brain]
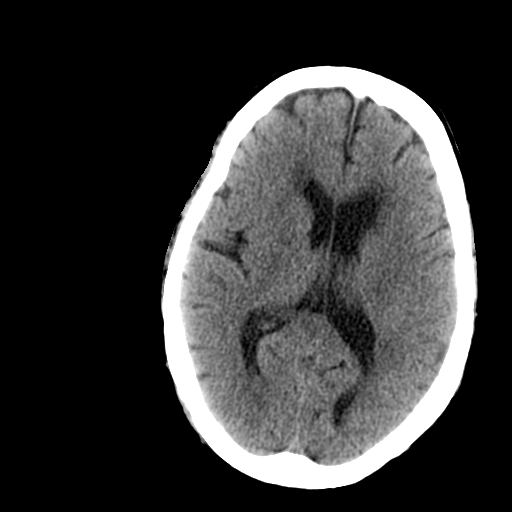
[im 17/36  brain]
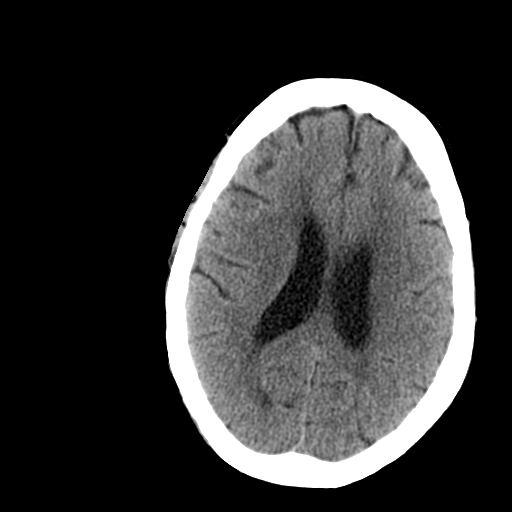
[im 19/36  brain]
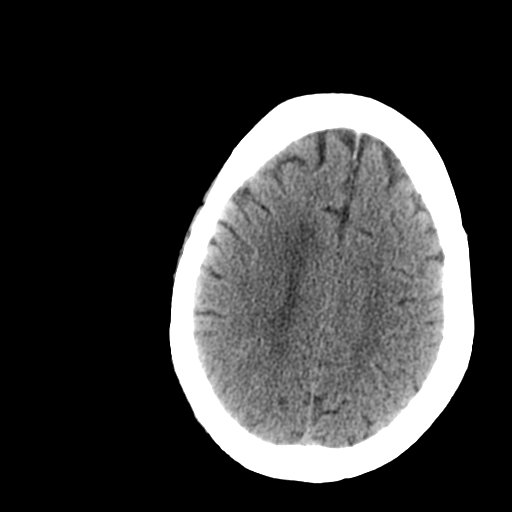
[im 19/36  bone]
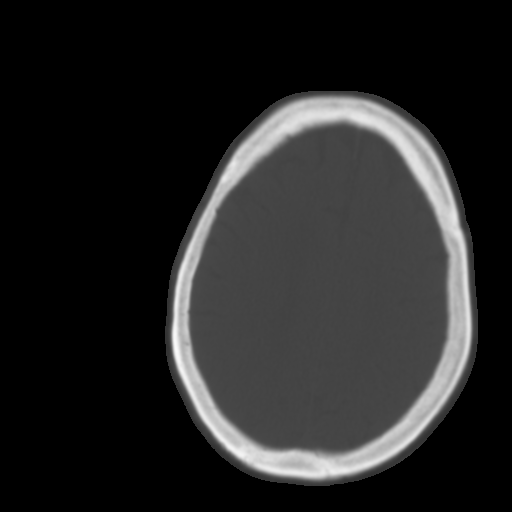
[im 21/36  brain]
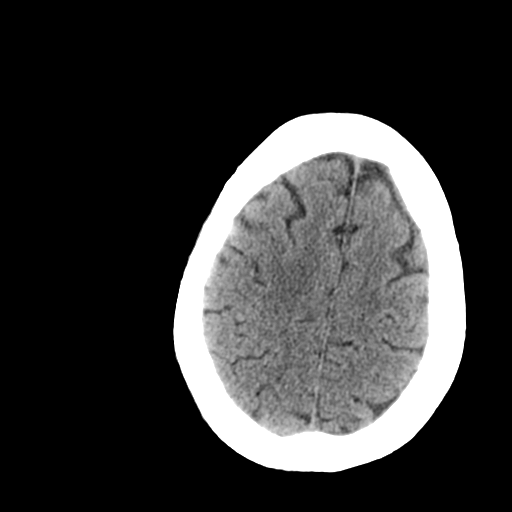
[im 23/36  brain]
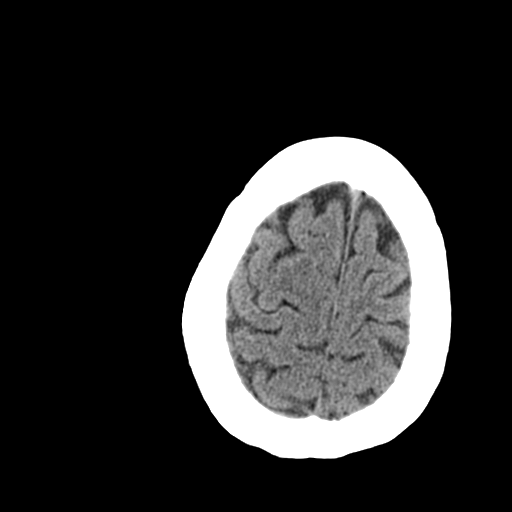
[im 25/36  brain]
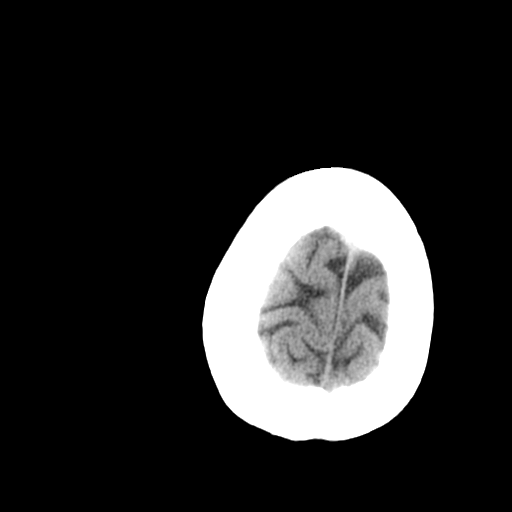
[im 27/36  brain]
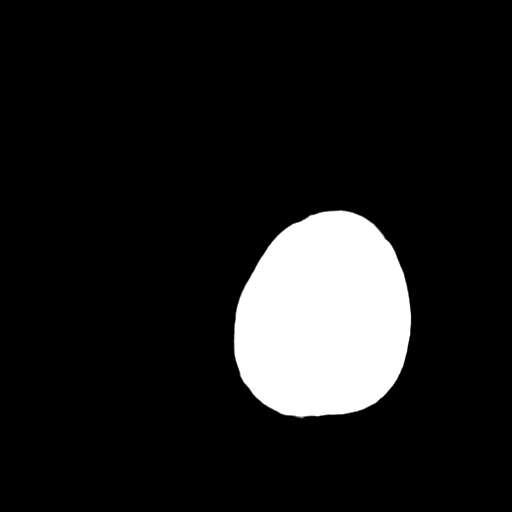
[im 27/36  bone]
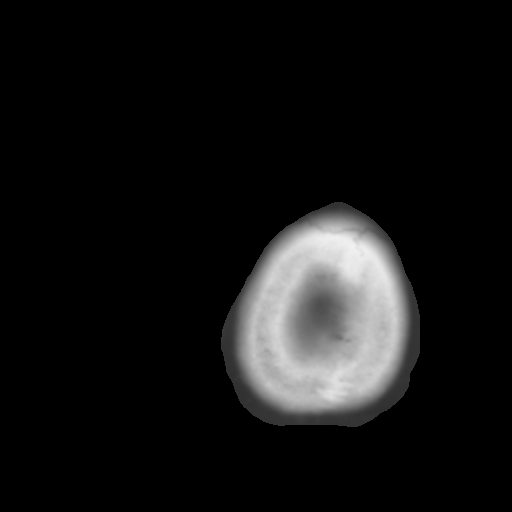
[im 29/36  brain]
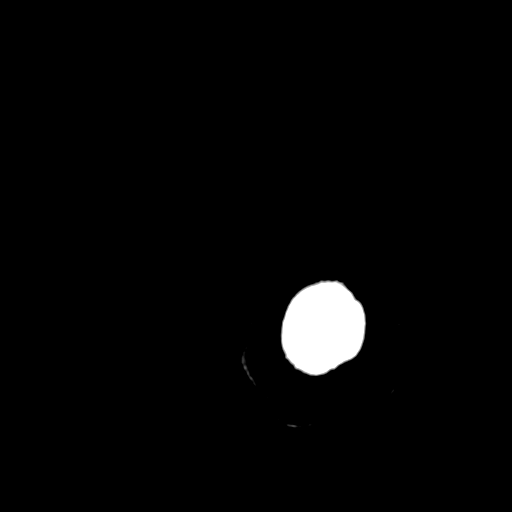
[im 31/36  brain]
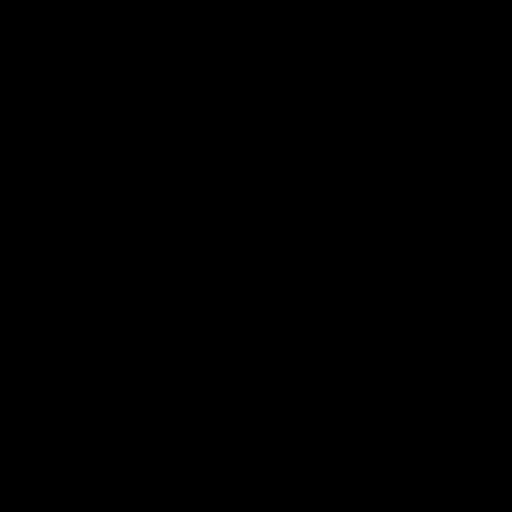
[im 33/36  brain]
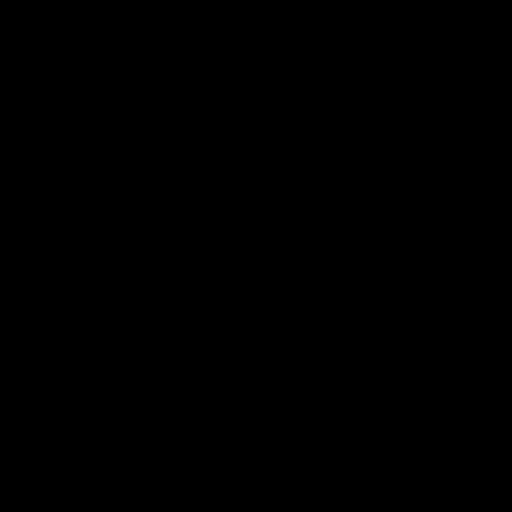

[16 of 30 positions shown; findings below may reference images not displayed]

FINDINGS: There is no evidence of acute intracranial abnormality
including infarct, hemorrhage, mass lesion, mass effect, midline or
abnormal extra axial fluid collection. Mild appearing chronic
microvascular ischemic change is again seen. No hydrocephalus or
pneumocephalus. Calvarium intact.
IMPRESSION: No acute finding.

## 2015-01-18 IMAGING — CR DG CHEST 2V
2 series · 2 of 2 positions shown · non-contrast
Comparison: 07/17/2013

CLINICAL DATA: Altered mental status

CHEST - 2 VIEW

[view not recorded (1 of 2)]
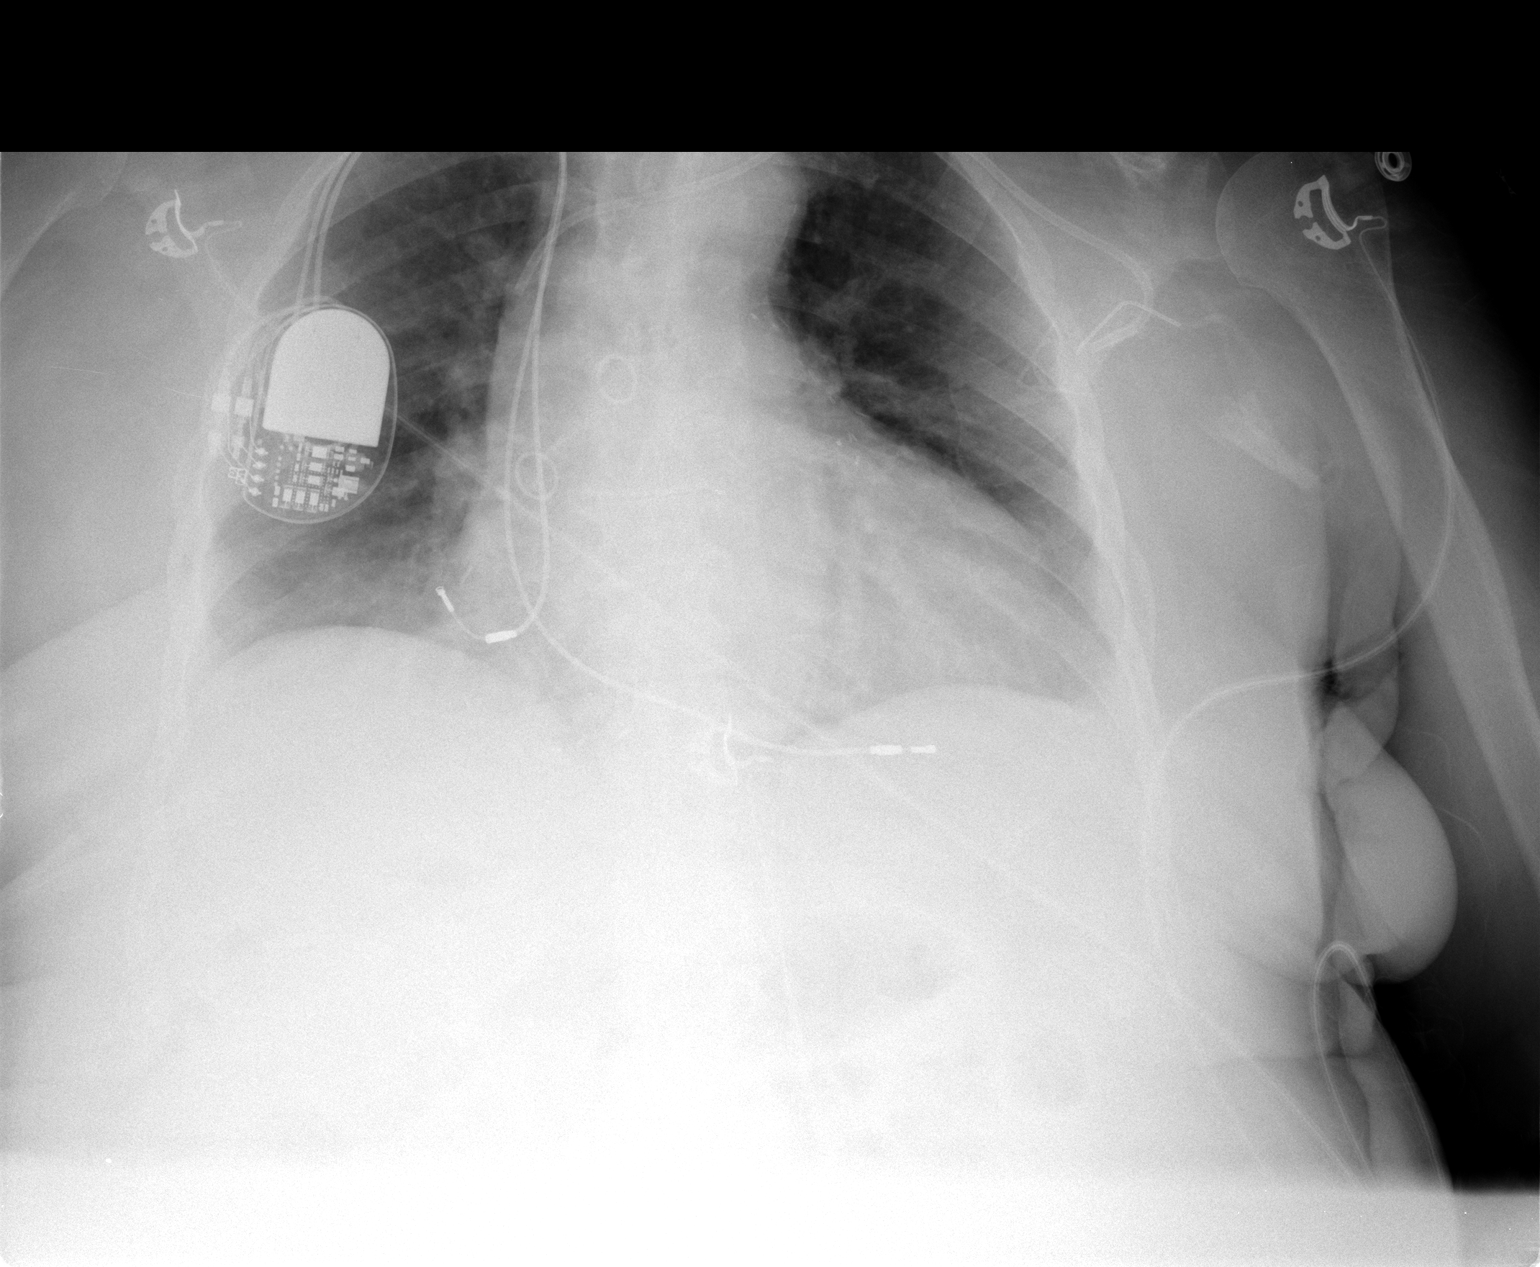

[view not recorded (2 of 2)]
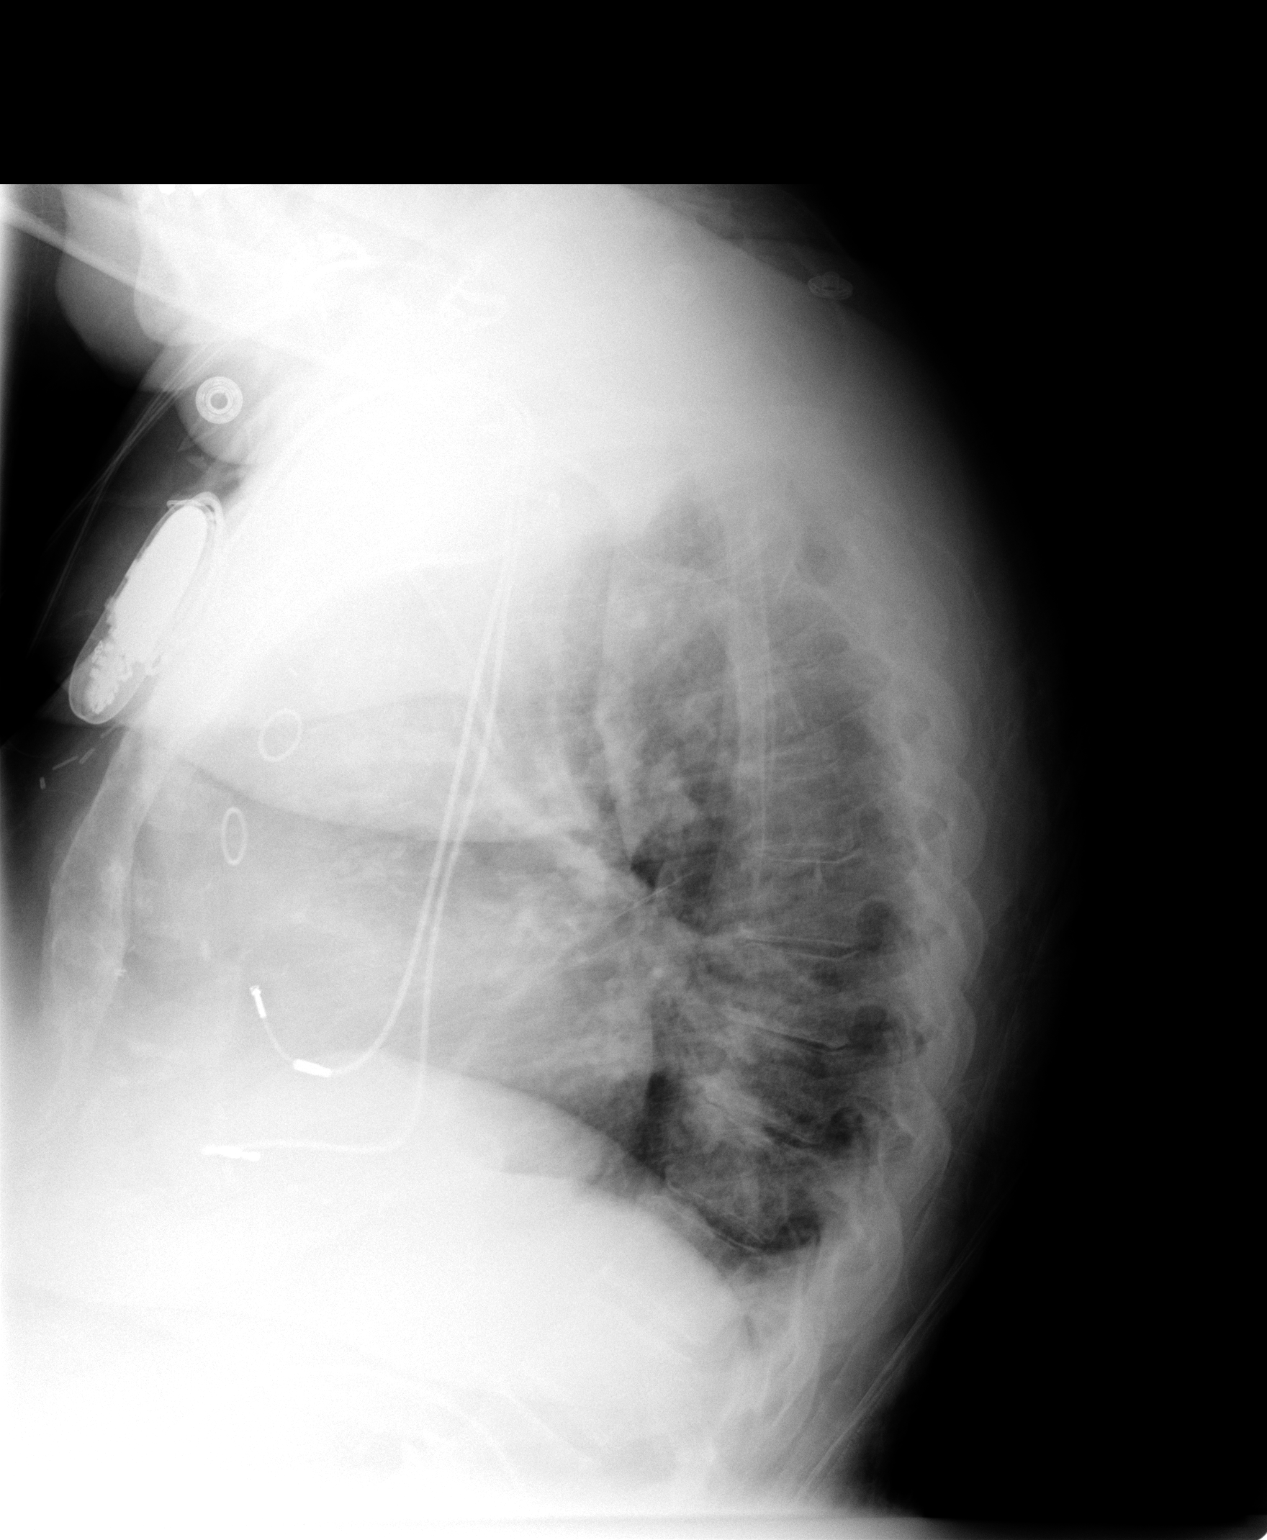

[2 of 2 positions shown; findings below may reference images not displayed]

FINDINGS: 0351 hours.  Lung volumes are low. The cardiopericardial
silhouette is enlarged.  The patient is status post CABG.  Right
permanent pacemaker remains in place.  Left PICC line has been
removed in the interval.  The left-sided Port-A-Cath remains in
place with distal tip position overlying the proximal SVC.
IMPRESSION: Cardiomegaly with low lung volumes.  No acute cardiopulmonary
findings.

## 2015-01-25 ENCOUNTER — Inpatient Hospital Stay
Admission: AD | Admit: 2015-01-25 | Payer: Self-pay | Source: Other Acute Inpatient Hospital | Admitting: Internal Medicine

## 2015-01-25 DIAGNOSIS — F419 Anxiety disorder, unspecified: Secondary | ICD-10-CM | POA: Diagnosis not present

## 2015-01-25 DIAGNOSIS — E162 Hypoglycemia, unspecified: Secondary | ICD-10-CM | POA: Diagnosis not present

## 2015-01-25 DIAGNOSIS — G9341 Metabolic encephalopathy: Secondary | ICD-10-CM | POA: Diagnosis not present

## 2015-01-25 DIAGNOSIS — I2510 Atherosclerotic heart disease of native coronary artery without angina pectoris: Secondary | ICD-10-CM | POA: Diagnosis not present

## 2015-01-25 DIAGNOSIS — E785 Hyperlipidemia, unspecified: Secondary | ICD-10-CM | POA: Diagnosis not present

## 2015-01-25 DIAGNOSIS — G40909 Epilepsy, unspecified, not intractable, without status epilepticus: Secondary | ICD-10-CM | POA: Diagnosis not present

## 2015-01-25 DIAGNOSIS — G92 Toxic encephalopathy: Secondary | ICD-10-CM | POA: Diagnosis not present

## 2015-01-25 DIAGNOSIS — E11649 Type 2 diabetes mellitus with hypoglycemia without coma: Secondary | ICD-10-CM | POA: Diagnosis not present

## 2015-01-25 DIAGNOSIS — T43011A Poisoning by tricyclic antidepressants, accidental (unintentional), initial encounter: Secondary | ICD-10-CM | POA: Diagnosis not present

## 2015-01-25 DIAGNOSIS — I129 Hypertensive chronic kidney disease with stage 1 through stage 4 chronic kidney disease, or unspecified chronic kidney disease: Secondary | ICD-10-CM | POA: Diagnosis not present

## 2015-01-25 DIAGNOSIS — N179 Acute kidney failure, unspecified: Secondary | ICD-10-CM | POA: Diagnosis not present

## 2015-01-25 DIAGNOSIS — D649 Anemia, unspecified: Secondary | ICD-10-CM | POA: Diagnosis not present

## 2015-01-25 DIAGNOSIS — T383X1A Poisoning by insulin and oral hypoglycemic [antidiabetic] drugs, accidental (unintentional), initial encounter: Secondary | ICD-10-CM | POA: Diagnosis not present

## 2015-02-02 ENCOUNTER — Emergency Department (HOSPITAL_COMMUNITY)
Admission: EM | Admit: 2015-02-02 | Discharge: 2015-02-03 | Disposition: A | Discharge status: Home / Self Care | Payer: Commercial Managed Care - HMO | Attending: Emergency Medicine | Admitting: Emergency Medicine

## 2015-02-02 ENCOUNTER — Emergency Department (HOSPITAL_COMMUNITY): Payer: Commercial Managed Care - HMO

## 2015-02-02 ENCOUNTER — Encounter (HOSPITAL_COMMUNITY): Payer: Self-pay | Admitting: *Deleted

## 2015-02-02 DIAGNOSIS — Z951 Presence of aortocoronary bypass graft: Secondary | ICD-10-CM | POA: Insufficient documentation

## 2015-02-02 DIAGNOSIS — Z87442 Personal history of urinary calculi: Secondary | ICD-10-CM | POA: Insufficient documentation

## 2015-02-02 DIAGNOSIS — K429 Umbilical hernia without obstruction or gangrene: Secondary | ICD-10-CM | POA: Diagnosis not present

## 2015-02-02 DIAGNOSIS — E119 Type 2 diabetes mellitus without complications: Secondary | ICD-10-CM | POA: Diagnosis not present

## 2015-02-02 DIAGNOSIS — Z86718 Personal history of other venous thrombosis and embolism: Secondary | ICD-10-CM | POA: Diagnosis not present

## 2015-02-02 DIAGNOSIS — K219 Gastro-esophageal reflux disease without esophagitis: Secondary | ICD-10-CM | POA: Insufficient documentation

## 2015-02-02 DIAGNOSIS — Z9889 Other specified postprocedural states: Secondary | ICD-10-CM | POA: Diagnosis not present

## 2015-02-02 DIAGNOSIS — R112 Nausea with vomiting, unspecified: Secondary | ICD-10-CM

## 2015-02-02 DIAGNOSIS — Z9089 Acquired absence of other organs: Secondary | ICD-10-CM | POA: Diagnosis not present

## 2015-02-02 DIAGNOSIS — Z8673 Personal history of transient ischemic attack (TIA), and cerebral infarction without residual deficits: Secondary | ICD-10-CM | POA: Insufficient documentation

## 2015-02-02 DIAGNOSIS — E669 Obesity, unspecified: Secondary | ICD-10-CM | POA: Insufficient documentation

## 2015-02-02 DIAGNOSIS — J449 Chronic obstructive pulmonary disease, unspecified: Secondary | ICD-10-CM | POA: Insufficient documentation

## 2015-02-02 DIAGNOSIS — I10 Essential (primary) hypertension: Secondary | ICD-10-CM

## 2015-02-02 DIAGNOSIS — Z7901 Long term (current) use of anticoagulants: Secondary | ICD-10-CM | POA: Insufficient documentation

## 2015-02-02 DIAGNOSIS — Z950 Presence of cardiac pacemaker: Secondary | ICD-10-CM | POA: Insufficient documentation

## 2015-02-02 DIAGNOSIS — G8929 Other chronic pain: Secondary | ICD-10-CM | POA: Diagnosis not present

## 2015-02-02 DIAGNOSIS — G4730 Sleep apnea, unspecified: Secondary | ICD-10-CM | POA: Insufficient documentation

## 2015-02-02 DIAGNOSIS — R197 Diarrhea, unspecified: Secondary | ICD-10-CM | POA: Insufficient documentation

## 2015-02-02 DIAGNOSIS — I252 Old myocardial infarction: Secondary | ICD-10-CM | POA: Insufficient documentation

## 2015-02-02 DIAGNOSIS — R1033 Periumbilical pain: Secondary | ICD-10-CM | POA: Diagnosis not present

## 2015-02-02 DIAGNOSIS — G40909 Epilepsy, unspecified, not intractable, without status epilepticus: Secondary | ICD-10-CM | POA: Insufficient documentation

## 2015-02-02 DIAGNOSIS — Z79899 Other long term (current) drug therapy: Secondary | ICD-10-CM | POA: Diagnosis not present

## 2015-02-02 DIAGNOSIS — Z9981 Dependence on supplemental oxygen: Secondary | ICD-10-CM | POA: Insufficient documentation

## 2015-02-02 DIAGNOSIS — Z9049 Acquired absence of other specified parts of digestive tract: Secondary | ICD-10-CM | POA: Diagnosis not present

## 2015-02-02 DIAGNOSIS — R109 Unspecified abdominal pain: Secondary | ICD-10-CM | POA: Diagnosis not present

## 2015-02-02 DIAGNOSIS — Z7982 Long term (current) use of aspirin: Secondary | ICD-10-CM | POA: Diagnosis not present

## 2015-02-02 DIAGNOSIS — M1990 Unspecified osteoarthritis, unspecified site: Secondary | ICD-10-CM | POA: Diagnosis not present

## 2015-02-02 DIAGNOSIS — G43909 Migraine, unspecified, not intractable, without status migrainosus: Secondary | ICD-10-CM | POA: Diagnosis not present

## 2015-02-02 DIAGNOSIS — Z862 Personal history of diseases of the blood and blood-forming organs and certain disorders involving the immune mechanism: Secondary | ICD-10-CM | POA: Insufficient documentation

## 2015-02-02 DIAGNOSIS — K5730 Diverticulosis of large intestine without perforation or abscess without bleeding: Secondary | ICD-10-CM | POA: Diagnosis not present

## 2015-02-02 DIAGNOSIS — Z8711 Personal history of peptic ulcer disease: Secondary | ICD-10-CM | POA: Diagnosis not present

## 2015-02-02 DIAGNOSIS — Z90710 Acquired absence of both cervix and uterus: Secondary | ICD-10-CM | POA: Insufficient documentation

## 2015-02-02 DIAGNOSIS — E785 Hyperlipidemia, unspecified: Secondary | ICD-10-CM | POA: Insufficient documentation

## 2015-02-02 DIAGNOSIS — Z791 Long term (current) use of non-steroidal anti-inflammatories (NSAID): Secondary | ICD-10-CM | POA: Diagnosis not present

## 2015-02-02 DIAGNOSIS — I2510 Atherosclerotic heart disease of native coronary artery without angina pectoris: Secondary | ICD-10-CM | POA: Diagnosis not present

## 2015-02-02 LAB — URINALYSIS, ROUTINE W REFLEX MICROSCOPIC
BILIRUBIN URINE: NEGATIVE
Glucose, UA: NEGATIVE mg/dL
KETONES UR: NEGATIVE mg/dL
Leukocytes, UA: NEGATIVE
Nitrite: NEGATIVE
PH: 7 (ref 5.0–8.0)
PROTEIN: 100 mg/dL — AB
Specific Gravity, Urine: 1.02 (ref 1.005–1.030)
Urobilinogen, UA: 0.2 mg/dL (ref 0.0–1.0)

## 2015-02-02 LAB — CBC WITH DIFFERENTIAL/PLATELET
Basophils Absolute: 0 10*3/uL (ref 0.0–0.1)
Basophils Relative: 0 % (ref 0–1)
Eosinophils Absolute: 0 10*3/uL (ref 0.0–0.7)
Eosinophils Relative: 0 % (ref 0–5)
HCT: 32.8 % — ABNORMAL LOW (ref 36.0–46.0)
Hemoglobin: 10.6 g/dL — ABNORMAL LOW (ref 12.0–15.0)
Lymphocytes Relative: 12 % (ref 12–46)
Lymphs Abs: 1.3 10*3/uL (ref 0.7–4.0)
MCH: 25.5 pg — ABNORMAL LOW (ref 26.0–34.0)
MCHC: 32.3 g/dL (ref 30.0–36.0)
MCV: 79 fL (ref 78.0–100.0)
MONOS PCT: 6 % (ref 3–12)
Monocytes Absolute: 0.6 10*3/uL (ref 0.1–1.0)
NEUTROS ABS: 9 10*3/uL — AB (ref 1.7–7.7)
NEUTROS PCT: 82 % — AB (ref 43–77)
Platelets: 374 10*3/uL (ref 150–400)
RBC: 4.15 MIL/uL (ref 3.87–5.11)
RDW: 14.9 % (ref 11.5–15.5)
WBC: 10.9 10*3/uL — AB (ref 4.0–10.5)

## 2015-02-02 LAB — COMPREHENSIVE METABOLIC PANEL
ALBUMIN: 4 g/dL (ref 3.5–5.2)
ALT: 18 U/L (ref 0–35)
ANION GAP: 9 (ref 5–15)
AST: 32 U/L (ref 0–37)
Alkaline Phosphatase: 69 U/L (ref 39–117)
BUN: 15 mg/dL (ref 6–23)
CO2: 24 mmol/L (ref 19–32)
Calcium: 9.6 mg/dL (ref 8.4–10.5)
Chloride: 105 mmol/L (ref 96–112)
Creatinine, Ser: 1.17 mg/dL — ABNORMAL HIGH (ref 0.50–1.10)
GFR calc Af Amer: 51 mL/min — ABNORMAL LOW (ref >90)
GFR calc non Af Amer: 44 mL/min — ABNORMAL LOW (ref >90)
GLUCOSE: 163 mg/dL — AB (ref 70–99)
Potassium: 2.9 mmol/L — ABNORMAL LOW (ref 3.5–5.1)
Sodium: 138 mmol/L (ref 135–145)
TOTAL PROTEIN: 8.6 g/dL — AB (ref 6.0–8.3)
Total Bilirubin: 0.7 mg/dL (ref 0.3–1.2)

## 2015-02-02 LAB — LIPASE, BLOOD: LIPASE: 42 U/L (ref 11–59)

## 2015-02-02 LAB — I-STAT CG4 LACTIC ACID, ED: Lactic Acid, Venous: 1.54 mmol/L (ref 0.5–2.0)

## 2015-02-02 LAB — URINE MICROSCOPIC-ADD ON

## 2015-02-02 MED ORDER — SODIUM CHLORIDE 0.9 % IV SOLN
Freq: Once | INTRAVENOUS | Status: AC
  Administered 2015-02-02: 21:00:00 via INTRAVENOUS

## 2015-02-02 MED ORDER — ONDANSETRON HCL 4 MG/2ML IJ SOLN
4.0000 mg | Freq: Once | INTRAMUSCULAR | Status: AC
  Administered 2015-02-02: 4 mg via INTRAVENOUS
  Filled 2015-02-02: qty 2

## 2015-02-02 MED ORDER — POTASSIUM CHLORIDE 10 MEQ/100ML IV SOLN
10.0000 meq | INTRAVENOUS | Status: AC
  Administered 2015-02-02 (×2): 10 meq via INTRAVENOUS
  Filled 2015-02-02 (×2): qty 100

## 2015-02-02 MED ORDER — MORPHINE SULFATE 4 MG/ML IJ SOLN
4.0000 mg | INTRAMUSCULAR | Status: DC | PRN
  Administered 2015-02-02: 4 mg via INTRAVENOUS
  Filled 2015-02-02: qty 1

## 2015-02-02 MED ORDER — HYDRALAZINE HCL 20 MG/ML IJ SOLN
10.0000 mg | Freq: Once | INTRAMUSCULAR | Status: AC
  Administered 2015-02-02: 10 mg via INTRAVENOUS
  Filled 2015-02-02: qty 1

## 2015-02-02 MED ORDER — SODIUM CHLORIDE 0.9 % IJ SOLN
INTRAMUSCULAR | Status: AC
  Administered 2015-02-02: 10 mL
  Filled 2015-02-02: qty 10

## 2015-02-02 MED ORDER — SODIUM CHLORIDE 0.9 % IV BOLUS (SEPSIS)
500.0000 mL | Freq: Once | INTRAVENOUS | Status: AC
  Administered 2015-02-02: 500 mL via INTRAVENOUS

## 2015-02-02 NOTE — ED Provider Notes (Signed)
[name] is a 74 y.o. with vomiting and abdominal pain.   THIS IS A SHARED VISIT.  The patient was evaluated initially by Dr. Fayrene FearingJames. Labs, CT and medications ordered. Patient's BP was elevated on arrival.  After medications for HTN and hypokalemia patient symptoms have improved. Patient daughter states that she is much better than earlier. Patient has had no vomiting since arrival to the ED.   BP 190/77 mmHg  Pulse 87  Temp(Src) 99.9 F (37.7 C) (Oral)  Resp 16  Ht 5' (1.524 m)  Wt 180 lb (81.647 kg)  BMI 35.15 kg/m2  SpO2 99%  Results for orders placed or performed during the hospital encounter of 02/02/15 (from the past 24 hour(s))  CBC with Differential/Platelet     Status: Abnormal   Collection Time: 02/02/15  8:45 PM  Result Value Ref Range   WBC 10.9 (H) 4.0 - 10.5 K/uL   RBC 4.15 3.87 - 5.11 MIL/uL   Hemoglobin 10.6 (L) 12.0 - 15.0 g/dL   HCT 82.932.8 (L) 56.236.0 - 13.046.0 %   MCV 79.0 78.0 - 100.0 fL   MCH 25.5 (L) 26.0 - 34.0 pg   MCHC 32.3 30.0 - 36.0 g/dL   RDW 86.514.9 78.411.5 - 69.615.5 %   Platelets 374 150 - 400 K/uL   Neutrophils Relative % 82 (H) 43 - 77 %   Neutro Abs 9.0 (H) 1.7 - 7.7 K/uL   Lymphocytes Relative 12 12 - 46 %   Lymphs Abs 1.3 0.7 - 4.0 K/uL   Monocytes Relative 6 3 - 12 %   Monocytes Absolute 0.6 0.1 - 1.0 K/uL   Eosinophils Relative 0 0 - 5 %   Eosinophils Absolute 0.0 0.0 - 0.7 K/uL   Basophils Relative 0 0 - 1 %   Basophils Absolute 0.0 0.0 - 0.1 K/uL  Comprehensive metabolic panel     Status: Abnormal   Collection Time: 02/02/15  8:45 PM  Result Value Ref Range   Sodium 138 135 - 145 mmol/L   Potassium 2.9 (L) 3.5 - 5.1 mmol/L   Chloride 105 96 - 112 mmol/L   CO2 24 19 - 32 mmol/L   Glucose, Bld 163 (H) 70 - 99 mg/dL   BUN 15 6 - 23 mg/dL   Creatinine, Ser 2.951.17 (H) 0.50 - 1.10 mg/dL   Calcium 9.6 8.4 - 28.410.5 mg/dL   Total Protein 8.6 (H) 6.0 - 8.3 g/dL   Albumin 4.0 3.5 - 5.2 g/dL   AST 32 0 - 37 U/L   ALT 18 0 - 35 U/L   Alkaline  Phosphatase 69 39 - 117 U/L   Total Bilirubin 0.7 0.3 - 1.2 mg/dL   GFR calc non Af Amer 44 (L) >90 mL/min   GFR calc Af Amer 51 (L) >90 mL/min   Anion gap 9 5 - 15  Lipase, blood     Status: None   Collection Time: 02/02/15  8:45 PM  Result Value Ref Range   Lipase 42 11 - 59 U/L  I-Stat CG4 Lactic Acid, ED     Status: None   Collection Time: 02/02/15  9:19 PM  Result Value Ref Range   Lactic Acid, Venous 1.54 0.5 - 2.0 mmol/L  Urinalysis, Routine w reflex microscopic     Status: Abnormal   Collection Time: 02/02/15 10:06 PM  Result Value Ref Range   Color, Urine YELLOW YELLOW   APPearance CLEAR CLEAR   Specific Gravity, Urine 1.020 1.005 - 1.030  pH 7.0 5.0 - 8.0   Glucose, UA NEGATIVE NEGATIVE mg/dL   Hgb urine dipstick SMALL (A) NEGATIVE   Bilirubin Urine NEGATIVE NEGATIVE   Ketones, ur NEGATIVE NEGATIVE mg/dL   Protein, ur 161 (A) NEGATIVE mg/dL   Urobilinogen, UA 0.2 0.0 - 1.0 mg/dL   Nitrite NEGATIVE NEGATIVE   Leukocytes, UA NEGATIVE NEGATIVE  Urine microscopic-add on     Status: Abnormal   Collection Time: 02/02/15 10:06 PM  Result Value Ref Range   Squamous Epithelial / LPF FEW (A) RARE   WBC, UA 0-2 <3 WBC/hpf   RBC / HPF 0-2 <3 RBC/hpf   Bacteria, UA RARE RARE    Ct Abdomen Pelvis Wo Contrast  02/02/2015   CLINICAL DATA:  Generalized abdominal pain and nausea, acute onset. Initial encounter.  EXAM: CT ABDOMEN AND PELVIS WITHOUT CONTRAST  TECHNIQUE: Multidetector CT imaging of the abdomen and pelvis was performed following the standard protocol without IV contrast.  COMPARISON:  CT of the abdomen and pelvis from 10/09/2014  FINDINGS: Mild bibasilar atelectasis is noted. Diffuse coronary artery calcifications are seen. Pacemaker leads are noted.  The liver and spleen are unremarkable in appearance. The patient is status post cholecystectomy. The pancreas and adrenal glands are unremarkable.  The kidneys are unremarkable in appearance. There is no evidence of  hydronephrosis. No renal or ureteral stones are seen. No perinephric stranding is appreciated.  No free fluid is identified. The small bowel is unremarkable in appearance. The stomach is within normal limits. No acute vascular abnormalities are seen. Scattered calcification is seen along the abdominal aorta and its branches, including along the proximal right renal artery.  A small umbilical hernia is seen, containing only fat.  The patient is status post appendectomy. Scattered diverticulosis is noted along the descending and proximal sigmoid colon, without evidence of diverticulitis. The colon is otherwise unremarkable in appearance.  The bladder is mildly distended and grossly unremarkable, though difficult to fully assess due to metal artifact. The patient is status post hysterectomy. No suspicious adnexal masses are seen. No inguinal lymphadenopathy is seen.  No acute osseous abnormalities are identified. Bilateral hip prostheses are grossly unremarkable in appearance, though incompletely imaged. The patient is status post lumbar spinal fusion at L3-L5, with underlying chronic degenerative change and endplate sclerosis, and associated decompression.  IMPRESSION: 1. No acute abnormality seen to explain the patient's symptoms. 2. Diffuse coronary artery calcifications seen. 3. Scattered calcification along the abdominal aorta and its branches, including along the proximal right renal artery. 4. Small umbilical hernia, containing only fat. 5. Mild bibasilar atelectasis noted. 6. Scattered diverticulosis along the descending and proximal sigmoid colon, without evidence of diverticulitis. 7. Status post lumbar spinal fusion at L3-L5, with underlying mild degenerative change.   Electronically Signed   By: Roanna Raider M.D.   On: 02/02/2015 22:52    Discussed with the patient's daughter CT results and plan of care. She will call Dr. Juanetta Gosling in the morning and let him know that the patient was evaluated tonight and  schedule a follow up appointment. All questioned fully answered. She will return if any problems arise.   I discussed this case with Dr. Preston Fleeting prior to d/c of the patient.   [address] [place], [place] 02/03/15 0960  Dione Booze, MD 02/03/15 [phone]

## 2015-02-02 NOTE — ED Notes (Signed)
Transported to ct 

## 2015-02-02 NOTE — ED Notes (Addendum)
Vomiting ,onset  Today   abd pain  Sl diarrhea,  Low back pain for 2 hours.    Given zofran, but vomited it

## 2015-02-02 NOTE — ED Notes (Signed)
edp pt bedside, aware of pt current vitals

## 2015-02-02 NOTE — ED Provider Notes (Signed)
CSN: [id number]     Arrival date & time 02/02/15  1937 History   First MD Initiated Contact with Patient 02/02/15 2018     Chief Complaint  Patient presents with  . Emesis    CC:  "I've been vomiking all day." HPI  She states that she has had emesis all day. Some abdominal pain that is periumbilical. Relieved with emesis. Daughter states the patient has been unable to hold down even fluids since noon today. Normal bowel bovement today. Soft but no frank diarrhea. No blood pus or mucus in her stools.Heme negative, nonbilious emesis.  Denies urinary symptoms with no dysuria or urgency or hematuria.   Past Medical History  Diagnosis Date  . Hypertension   . CAD (coronary artery disease)     multivessel, CABG 6/10.  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  Myoview (11/20/11) showed no ischemia & EF 62%  . Hyperlipidemia   . Deep vein thrombosis, upper right extremity     post-PPM  . Obesity   . Dehiscence of closure of sternum or sternotomy 07/2009    sternal infection; required flap closure  . COPD (chronic obstructive pulmonary disease)     severe lung disease by PFTs 6/12  . Seizure disorder   . 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 PUD (peptic ulcer disease)   . Pacemaker   . Sleep apnea     denies CPAP use on 09/30/2013  . Diabetes mellitus, type II   . H/O hiatal hernia   . Migraines     "years ago" (09/30/2013)  . DDD (degenerative disc disease)   . Arthritis     "legs and back" (09/30/2013)  . Chronic lower back pain   . Urge incontinence of urine   . Myocardial infarction     years ago  . Anginal pain     "all the time, but not heart related"  . Asthma     years ago  . Orthopnea     sometimes  . 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 History of kidney stones   . Gastroesophageal reflux disease     not much  . Seizures     "long time ago; don't remember what they were related to" (09/30/2013)none recent  . Anemia   .  Blurred vision, bilateral     sometimes double vision   Past Surgical History  Procedure Laterality Date  . Pacemaker placement  1993    Status post DDD pacemaker implantation in 1993, with Medtronic Kappa generator change in 2003 for  second-degree AV block, most recent gen change by Fawn Kirk 04/14/11  . Total hip arthroplasty Right   . Replacement total knee Bilateral   . Thyroidectomy    . Wrist surgery Left     "tumor taken off" (09/30/2013)  . Coronary artery bypass graft  05/1999    - LIMA to LAD, SVG to OM, SVG to PDA  . Reconstructive repair sternal      for infection s/p CABG 8/10  . Esophagogastroduodenoscopy      with [facility] dilation,  biopsy, and disruption Schatzki's ring  . Colonoscopy  04/27/2005    hemorrhoids, diverticula  . Esophagogastroduodenoscopy  09/10/2012    RMR: Noncritical Schatzki's ring. Diffuse gastric erosions and an  area of partially healing ulceration-status post biopsy/ Small hiatal hernia. Bx reactive gastropathy.  . Colonoscopy with esophagogastroduodenoscopy (egd) N/A 02/20/2013    RMR: pancolonic  diverticulosis and internal hemorrhoids. EGD with small hiatal hernia, healed gastric ulcer  . Portacath placement Left 07/17/2013    Procedure: INSERTION PORT-A-CATH-left subclavian;  Surgeon: Fabio Bering, MD;  Location: [facility];  Service: General;  Laterality: Left;  . Appendectomy    . Cholecystectomy    . Insert / replace / remove pacemaker  1993  . Lumbar disc surgery    . Posterior lumbar fusion    . Tubal ligation    . Cesarean section  ~1980  . Abdominal hysterectomy      partial  . Back surgery      x3 total  . Total hip arthroplasty Left 12/03/2013    Procedure: LEFT TOTAL HIP ARTHROPLASTY ANTERIOR APPROACH;  Surgeon: Shelda Pal, MD;  Location: [facility];  Service: Orthopedics;  Laterality: Left;  . Joint replacement     Family History  Problem Relation Age of Onset  . Diabetes    . Heart disease Mother     MI  . Coronary artery disease       Female <55  . Cancer    . Colon cancer Father     age 52   History  Substance Use Topics  . Smoking status: Never Smoker   . Smokeless tobacco: Former Neurosurgeon    Types: Snuff     Comment: 09/30/2013 "stopped snuff a long time ago"  . Alcohol Use: No   OB History    No data available     Review of Systems  Constitutional: Negative for fever, chills, diaphoresis, appetite change and fatigue.  HENT: Negative for mouth sores, sore throat and trouble swallowing.   Eyes: Negative for visual disturbance.  Respiratory: Negative for cough, chest tightness, shortness of breath and wheezing.   Cardiovascular: Negative for chest pain.  Gastrointestinal: Positive for nausea, vomiting, abdominal pain and diarrhea. Negative for abdominal distention.  Endocrine: Negative for polydipsia, polyphagia and polyuria.  Genitourinary: Negative for dysuria, frequency and hematuria.  Musculoskeletal: Negative for gait problem.  Skin: Negative for color change, pallor and rash.  Neurological: Negative for dizziness, syncope, light-headedness and headaches.  Hematological: Does not bruise/bleed easily.  Psychiatric/Behavioral: Negative for behavioral problems and confusion.      Allergies  Codeine; Other; Oxycodone hcl; Peanuts; and Reglan  Home Medications   Prior to Admission medications   Medication Sig Start Date End Date Taking? Authorizing Provider  acetaminophen (TYLENOL) 325 MG tablet Take 1-2 tablets (325-650 mg total) by mouth every 6 (six) hours as needed. Patient taking differently: Take 325-650 mg by mouth every 6 (six) hours as needed for mild pain.  12/06/13  Yes Genelle Gather Babish, PA-C  aspirin 81 MG tablet Take 81 mg by mouth daily.   Yes Historical Provider, MD  atorvastatin (LIPITOR) 40 MG tablet Take 1 tablet (40 mg total) by mouth daily. 09/10/14  Yes Antoine Poche, MD  benazepril (LOTENSIN) 40 MG tablet Take 40 mg by mouth every morning.   Yes Historical Provider, MD   docusate sodium 100 MG CAPS Take 100 mg by mouth 2 (two) times daily. 12/06/13  Yes Genelle Gather Babish, PA-C  doxepin (SINEQUAN) 10 MG capsule Take 10 mg by mouth at bedtime.   Yes Historical Provider, MD  furosemide (LASIX) 20 MG tablet TAKE 1 TABLET BY MOUTH IN THE MORNING. 10/08/14  Yes Marinus Maw, MD  levETIRAcetam (KEPPRA) 500 MG tablet Take 500 mg by mouth 2 (two) times daily.   Yes Historical Provider, MD  lubiprostone Valinda Hoar)  24 MCG capsule Take 24 mcg by mouth 2 (two) times daily with a meal.   Yes Historical Provider, MD  metFORMIN (GLUCOPHAGE) 500 MG tablet Take 500 mg by mouth 2 (two) times daily with a meal.    Yes Historical Provider, MD  metoprolol tartrate (LOPRESSOR) 25 MG tablet Take 25 mg by mouth 2 (two) times daily.    Yes Historical Provider, MD  omega-3 acid ethyl esters (LOVAZA) 1 G capsule Take 2 g by mouth 2 (two) times daily.    Yes Historical Provider, MD  oxybutynin (DITROPAN-XL) 10 MG 24 hr tablet Take 10 mg by mouth every morning.    Yes Historical Provider, MD  pantoprazole (PROTONIX) 40 MG tablet Take 1 tablet (40 mg total) by mouth daily. 12/03/13  Yes Nira Retort, NP  polyethylene glycol (MIRALAX / GLYCOLAX) packet Take 17 g by mouth 2 (two) times daily. Patient taking differently: Take 17 g by mouth daily as needed for mild constipation.  12/06/13  Yes Genelle Gather Babish, PA-C  potassium chloride (K-DUR,KLOR-CON) 10 MEQ tablet Take 10 mEq by mouth daily.   Yes Historical Provider, MD  traMADol (ULTRAM) 50 MG tablet Take 1-2 tablets (50-100 mg total) by mouth every 6 (six) hours as needed for moderate pain. 12/06/13  Yes Genelle Gather Babish, PA-C  ketorolac (ACULAR) 0.5 % ophthalmic solution Place 1 drop into the right eye 3 (three) times daily.    Historical Provider, MD  magnesium hydroxide (MILK OF MAGNESIA) 400 MG/5ML suspension Take 30 mLs by mouth daily as needed for mild constipation.    Historical Provider, MD  nitroGLYCERIN (NITROSTAT) 0.4 MG SL  tablet Place 1 tablet (0.4 mg total) under the tongue every 5 (five) minutes as needed for chest pain. 05/20/14   Antoine Poche, MD  warfarin (COUMADIN) 3 MG tablet Take 1 tablet (3 mg total) by mouth daily. 02/06/15   Marinus Maw, MD   BP 169/73 mmHg  Pulse 98  Temp(Src) 99.3 F (37.4 C) (Oral)  Resp 18  Ht 5' (1.524 m)  Wt 180 lb (81.647 kg)  BMI 35.15 kg/m2  SpO2 95% Physical Exam  Constitutional: She is oriented to person, place, and time. She appears well-developed and well-nourished. No distress.  HENT:  Head: Normocephalic.  Eyes: Conjunctivae are normal. Pupils are equal, round, and reactive to light. No scleral icterus.  Neck: Normal range of motion. Neck supple. No thyromegaly present.  Cardiovascular: Normal rate and regular rhythm.  Exam reveals no gallop and no friction rub.   No murmur heard. Pulmonary/Chest: Effort normal and breath sounds normal. No respiratory distress. She has no wheezes. She has no rales.  Abdominal: Soft. Bowel sounds are normal. She exhibits no distension. There is no tenderness. There is no rebound.  Obese. Soft. No focal tenderness or peritoneal irritation. Slightly hypoactive bowel sounds  Musculoskeletal: Normal range of motion.  Neurological: She is alert and oriented to person, place, and time.  Skin: Skin is warm and dry. No rash noted.  Psychiatric: She has a normal mood and affect. Her behavior is normal.    ED Course  Procedures (including critical care time) Labs Review Labs Reviewed  URINALYSIS, ROUTINE W REFLEX MICROSCOPIC - Abnormal; Notable for the following:    Hgb urine dipstick SMALL (*)    Protein, ur 100 (*)    All other components within normal limits  CBC WITH DIFFERENTIAL/PLATELET - Abnormal; Notable for the following:    WBC 10.9 (*)    Hemoglobin  10.6 (*)    HCT 32.8 (*)    MCH 25.5 (*)    Neutrophils Relative % 82 (*)    Neutro Abs 9.0 (*)    All other components within normal limits  COMPREHENSIVE  METABOLIC PANEL - Abnormal; Notable for the following:    Potassium 2.9 (*)    Glucose, Bld 163 (*)    Creatinine, Ser 1.17 (*)    Total Protein 8.6 (*)    GFR calc non Af Amer 44 (*)    GFR calc Af Amer 51 (*)    All other components within normal limits  URINE MICROSCOPIC-ADD ON - Abnormal; Notable for the following:    Squamous Epithelial / LPF FEW (*)    All other components within normal limits  LIPASE, BLOOD  I-STAT CG4 LACTIC ACID, ED    Imaging Review No results found.   EKG Interpretation None      MDM   Final diagnoses:  AP (abdominal pain)  Essential hypertension  Nausea and vomiting, vomiting of unspecified type    Patient's nausea improved. Actually even able to take by mouth contrast. Creatinine is 0.17 which the best reading she's had an months. Normal lactic acid. Normal lipase and hepatobiliary enzymes.  On exam she appears more comfort per she is watching TV. Awaiting results of urinalysis, and waiting for CT scan. Have asked for IV potassium, 10 mEq 2.    History patient's CT scan does not suggest acute pathology, her nausea continues to be under good control, and does not show urinary tract infection, think she could possibly be treated as an outpatient.  Was given one dose of IV hydralazine, 10 mg, and BP is improved from 228/117, down to 204/88.    Rolland PorterMark Gideon Burstein, MD 02/07/15 [phone]

## 2015-02-03 MED ORDER — HEPARIN SOD (PORK) LOCK FLUSH 100 UNIT/ML IV SOLN
INTRAVENOUS | Status: AC
  Filled 2015-02-03: qty 5

## 2015-02-03 NOTE — Discharge Instructions (Signed)
Call Dr. Juanetta GoslingHawkins tomorrow to let him know you were here in the ED with vomiting and elevated BP.

## 2015-02-06 ENCOUNTER — Encounter: Payer: Self-pay | Admitting: Internal Medicine

## 2015-02-06 ENCOUNTER — Ambulatory Visit (INDEPENDENT_AMBULATORY_CARE_PROVIDER_SITE_OTHER): Payer: Commercial Managed Care - HMO | Admitting: Internal Medicine

## 2015-02-06 VITALS — BP 134/82 | HR 54 | Ht 62.0 in | Wt 184.6 lb

## 2015-02-06 DIAGNOSIS — Z950 Presence of cardiac pacemaker: Secondary | ICD-10-CM

## 2015-02-06 DIAGNOSIS — I480 Paroxysmal atrial fibrillation: Secondary | ICD-10-CM | POA: Diagnosis not present

## 2015-02-06 DIAGNOSIS — I10 Essential (primary) hypertension: Secondary | ICD-10-CM

## 2015-02-06 DIAGNOSIS — I4891 Unspecified atrial fibrillation: Secondary | ICD-10-CM | POA: Insufficient documentation

## 2015-02-06 DIAGNOSIS — E669 Obesity, unspecified: Secondary | ICD-10-CM

## 2015-02-06 DIAGNOSIS — I4430 Unspecified atrioventricular block: Secondary | ICD-10-CM | POA: Diagnosis not present

## 2015-02-06 LAB — MDC_IDC_ENUM_SESS_TYPE_INCLINIC
Battery Impedance: 185 Ohm
Battery Remaining Longevity: 129 mo
Brady Statistic AP VP Percent: 7 %
Brady Statistic AS VS Percent: 8 %
Lead Channel Impedance Value: 623 Ohm
Lead Channel Pacing Threshold Amplitude: 1.5 V
Lead Channel Sensing Intrinsic Amplitude: 0.25 mV
Lead Channel Setting Pacing Amplitude: 2.25 V
Lead Channel Setting Pacing Amplitude: 3 V
Lead Channel Setting Pacing Pulse Width: 0.4 ms
MDC IDC MSMT BATTERY VOLTAGE: 2.79 V
MDC IDC MSMT LEADCHNL RA IMPEDANCE VALUE: 570 Ohm
MDC IDC MSMT LEADCHNL RV PACING THRESHOLD PULSEWIDTH: 0.4 ms
MDC IDC MSMT LEADCHNL RV SENSING INTR AMPL: 15.67 mV
MDC IDC SESS DTM: 20160304094349
MDC IDC SET LEADCHNL RV SENSING SENSITIVITY: 5.6 mV
MDC IDC STAT BRADY AP VS PERCENT: 84 %
MDC IDC STAT BRADY AS VP PERCENT: 1 %

## 2015-02-06 LAB — PACEMAKER DEVICE OBSERVATION

## 2015-02-06 MED ORDER — WARFARIN SODIUM 3 MG PO TABS: 3.0000 mg | ORAL_TABLET | Freq: Every day | ORAL | Status: DC

## 2015-02-06 NOTE — Assessment & Plan Note (Signed)
Her medtronic DDD PM is working normally. She has been out of rhythm for 2 days.

## 2015-02-06 NOTE — Assessment & Plan Note (Signed)
This is a new problem for which she is asymptomatic. Her rate is controlled. She will start warfarin and continue low dose asa.

## 2015-02-06 NOTE — Patient Instructions (Addendum)
Your physician recommends that you schedule a follow-up appointment in: 6 months with Device clinic.  Your physician recommends that you schedule a follow-up appointment in: 1 year with Dr. Ladona Ridgelaylor.   Your physician has recommended you make the following change in your medication:   Start:  Warfarin 3 mg Daily Continue Aspirin 81 mg daily   Please see Misty StanleyLisa in the [facility] on Wed. Or Thru.  Thank you for choosing  [facility]!

## 2015-02-06 NOTE — Assessment & Plan Note (Signed)
She is encouraged to lose weight. Will follow.  

## 2015-02-06 NOTE — Assessment & Plan Note (Signed)
Her blood pressure is reasonably well controlled. No change in meds. 

## 2015-02-06 NOTE — Progress Notes (Signed)
HPI [name] returns today for pacemaker followup. She is a 74 year old woman with chronic diastolic heart failure, coronary artery disease, status post bypass surgery, COPD on nighttime home oxygen, symptomatic bradycardia, status post permanent pacemaker insertion. She also has obesity, and chronic arthritis. She denies syncope or near-syncope. No chest pain. She has undergone hip surgery but still has pain. She denies palpitations or worsening sob.   Allergies  Allergen Reactions  . Codeine     "good" headache  . Other Nausea And Vomiting    Leafy raw vegetables and seeds  . Oxycodone Hcl     "good" headache; also OxyContin  . Peanuts [Peanut Oil] Nausea And Vomiting  . Reglan [Metoclopramide]     CONFUSION     Current Outpatient Prescriptions  Medication Sig Dispense Refill  . acetaminophen (TYLENOL) 325 MG tablet Take 1-2 tablets (325-650 mg total) by mouth every 6 (six) hours as needed. (Patient taking differently: Take 325-650 mg by mouth every 6 (six) hours as needed for mild pain. )    . aspirin 81 MG tablet Take 81 mg by mouth daily.    Marland Kitchen atorvastatin (LIPITOR) 40 MG tablet Take 1 tablet (40 mg total) by mouth daily. 30 tablet 0  . benazepril (LOTENSIN) 40 MG tablet Take 40 mg by mouth every morning.    . docusate sodium 100 MG CAPS Take 100 mg by mouth 2 (two) times daily. 10 capsule 0  . doxepin (SINEQUAN) 10 MG capsule Take 10 mg by mouth at bedtime.    . furosemide (LASIX) 20 MG tablet TAKE 1 TABLET BY MOUTH IN THE MORNING. 30 tablet 3  . ketorolac (ACULAR) 0.5 % ophthalmic solution Place 1 drop into the right eye 3 (three) times daily.    Marland Kitchen levETIRAcetam (KEPPRA) 500 MG tablet Take 500 mg by mouth 2 (two) times daily.    Marland Kitchen lubiprostone (AMITIZA) 24 MCG capsule Take 24 mcg by mouth 2 (two) times daily with a meal.    . magnesium hydroxide (MILK OF MAGNESIA) 400 MG/5ML suspension Take 30 mLs by mouth daily as needed for mild constipation.    . metFORMIN (GLUCOPHAGE) 500 MG  tablet Take 500 mg by mouth 2 (two) times daily with a meal.     . metoprolol tartrate (LOPRESSOR) 25 MG tablet Take 25 mg by mouth 2 (two) times daily.     . nitroGLYCERIN (NITROSTAT) 0.4 MG SL tablet Place 1 tablet (0.4 mg total) under the tongue every 5 (five) minutes as needed for chest pain. 25 tablet 1  . omega-3 acid ethyl esters (LOVAZA) 1 G capsule Take 2 g by mouth 2 (two) times daily.     Marland Kitchen oxybutynin (DITROPAN-XL) 10 MG 24 hr tablet Take 10 mg by mouth every morning.     . pantoprazole (PROTONIX) 40 MG tablet Take 1 tablet (40 mg total) by mouth daily. 30 tablet 3  . polyethylene glycol (MIRALAX / GLYCOLAX) packet Take 17 g by mouth 2 (two) times daily. (Patient taking differently: Take 17 g by mouth daily as needed for mild constipation. ) 14 each 0  . potassium chloride (K-DUR,KLOR-CON) 10 MEQ tablet Take 10 mEq by mouth daily.    . traMADol (ULTRAM) 50 MG tablet Take 1-2 tablets (50-100 mg total) by mouth every 6 (six) hours as needed for moderate pain. 80 tablet 0   No current facility-administered medications for this visit.     Past Medical History  Diagnosis Date  . Hypertension   . CAD (coronary  artery disease)     multivessel, CABG 6/10.  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  Myoview (11/20/11) showed no ischemia & EF 62%  . Hyperlipidemia   . Deep vein thrombosis, upper right extremity     post-PPM  . Obesity   . Dehiscence of closure of sternum or sternotomy 07/2009    sternal infection; required flap closure  . COPD (chronic obstructive pulmonary disease)     severe lung disease by PFTs 6/12  . Seizure disorder   . 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 PUD (peptic ulcer disease)   . Pacemaker   . Sleep apnea     denies CPAP use on 09/30/2013  . Diabetes mellitus, type II   . H/O hiatal hernia   . Migraines     "years ago" (09/30/2013)  . DDD (degenerative disc disease)   . Arthritis     "legs and back" (09/30/2013)  .  Chronic lower back pain   . Urge incontinence of urine   . Myocardial infarction     years ago  . Anginal pain     "all the time, but not heart related"  . Asthma     years ago  . Orthopnea     sometimes  . 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 History of kidney stones   . Gastroesophageal reflux disease     not much  . Seizures     "long time ago; don't remember what they were related to" (09/30/2013)none recent  . Anemia   . Blurred vision, bilateral     sometimes double vision    ROS:   All systems reviewed and negative except as noted in the HPI.   Past Surgical History  Procedure Laterality Date  . Pacemaker placement  1993    Status post DDD pacemaker implantation in 1993, with Medtronic Kappa generator change in 2003 for  second-degree AV block, most recent gen change by Fawn Kirk 04/14/11  . Total hip arthroplasty Right   . Replacement total knee Bilateral   . Thyroidectomy    . Wrist surgery Left     "tumor taken off" (09/30/2013)  . Coronary artery bypass graft  05/1999    - LIMA to LAD, SVG to OM, SVG to PDA  . Reconstructive repair sternal      for infection s/p CABG 8/10  . Esophagogastroduodenoscopy      with [facility] dilation,  biopsy, and disruption Schatzki's ring  . Colonoscopy  04/27/2005    hemorrhoids, diverticula  . Esophagogastroduodenoscopy  09/10/2012    RMR: Noncritical Schatzki's ring. Diffuse gastric erosions and an  area of partially healing ulceration-status post biopsy/ Small hiatal hernia. Bx reactive gastropathy.  . Colonoscopy with esophagogastroduodenoscopy (egd) N/A 02/20/2013    RMR: pancolonic diverticulosis and internal hemorrhoids. EGD with small hiatal hernia, healed gastric ulcer  . Portacath placement Left 07/17/2013    Procedure: INSERTION PORT-A-CATH-left subclavian;  Surgeon: Fabio Bering, MD;  Location: [facility];  Service: General;  Laterality: Left;  . Appendectomy    . Cholecystectomy    . Insert / replace / remove  pacemaker  1993  . Lumbar disc surgery    . Posterior lumbar fusion    . Tubal ligation    . Cesarean section  ~1980  . Abdominal hysterectomy      partial  . Back surgery      x3 total  . Total  hip arthroplasty Left 12/03/2013    Procedure: LEFT TOTAL HIP ARTHROPLASTY ANTERIOR APPROACH;  Surgeon: Shelda PalMatthew D Olin, MD;  Location: [facility];  Service: Orthopedics;  Laterality: Left;  . Joint replacement       Family History  Problem Relation Age of Onset  . Diabetes    . Heart disease Mother     MI  . Coronary artery disease      Female <55  . Cancer    . Colon cancer Father     age 76     History   Social History  . Marital Status: Married    Spouse Name: N/A  . Number of Children: 5  . Years of Education: N/A   Occupational History  .     Social History Main Topics  . Smoking status: Never Smoker   . Smokeless tobacco: Former NeurosurgeonUser    Types: Snuff     Comment: 09/30/2013 "stopped snuff a long time ago"  . Alcohol Use: No  . Drug Use: No  . Sexual Activity: No   Other Topics Concern  . Not on file   Social History Narrative     BP 134/82 mmHg  Pulse 54  Ht 5\' 2"  (1.575 m)  Wt 184 lb 9.6 oz (83.734 kg)  BMI 33.76 kg/m2  Physical Exam:  Chronically ill appearing 74 year old woman, NAD HEENT: Unremarkable Neck:  7 cm JVD, no thyromegally Back:  Musculoskeletal tenderness is present in the lower back region. Lungs:  Clear except for rales in the bases bilaterally. No wheezes or rhonchi. No increased work of breathing. HEART:  Regular rate rhythm, no murmurs, no rubs, no clicks Abd:  soft, positive bowel sounds, no organomegally, no rebound, no guarding Ext:  2 plus pulses, no edema, no cyanosis, no clubbing Skin:  No rashes no nodules Neuro:  CN II through XII intact, motor grossly intact  DEVICE  Normal device function.  See PaceArt for details.   Assess/Plan:

## 2015-02-09 ENCOUNTER — Telehealth: Payer: Self-pay | Admitting: Cardiology

## 2015-02-09 ENCOUNTER — Encounter: Payer: Self-pay | Admitting: Internal Medicine

## 2015-02-09 DIAGNOSIS — R197 Diarrhea, unspecified: Secondary | ICD-10-CM | POA: Diagnosis not present

## 2015-02-09 DIAGNOSIS — E1121 Type 2 diabetes mellitus with diabetic nephropathy: Secondary | ICD-10-CM | POA: Diagnosis not present

## 2015-02-09 DIAGNOSIS — I4891 Unspecified atrial fibrillation: Secondary | ICD-10-CM | POA: Diagnosis not present

## 2015-02-09 DIAGNOSIS — I10 Essential (primary) hypertension: Secondary | ICD-10-CM | POA: Diagnosis not present

## 2015-02-09 NOTE — Telephone Encounter (Signed)
FYI to Dr. Taylor.

## 2015-02-09 NOTE — Telephone Encounter (Signed)
FYI : Dr.Hawkins took patient off of Coumadin and started on Eliquis / tgs

## 2015-02-12 ENCOUNTER — Emergency Department (HOSPITAL_COMMUNITY)
Admission: EM | Admit: 2015-02-12 | Discharge: 2015-02-12 | Disposition: A | Discharge status: Home / Self Care | Payer: Commercial Managed Care - HMO | Attending: Emergency Medicine | Admitting: Emergency Medicine

## 2015-02-12 ENCOUNTER — Emergency Department (HOSPITAL_COMMUNITY): Payer: Commercial Managed Care - HMO

## 2015-02-12 ENCOUNTER — Encounter (HOSPITAL_COMMUNITY): Payer: Self-pay | Admitting: Emergency Medicine

## 2015-02-12 DIAGNOSIS — Z862 Personal history of diseases of the blood and blood-forming organs and certain disorders involving the immune mechanism: Secondary | ICD-10-CM | POA: Insufficient documentation

## 2015-02-12 DIAGNOSIS — Z7982 Long term (current) use of aspirin: Secondary | ICD-10-CM | POA: Diagnosis not present

## 2015-02-12 DIAGNOSIS — E785 Hyperlipidemia, unspecified: Secondary | ICD-10-CM | POA: Diagnosis not present

## 2015-02-12 DIAGNOSIS — I25119 Atherosclerotic heart disease of native coronary artery with unspecified angina pectoris: Secondary | ICD-10-CM | POA: Insufficient documentation

## 2015-02-12 DIAGNOSIS — G8929 Other chronic pain: Secondary | ICD-10-CM | POA: Insufficient documentation

## 2015-02-12 DIAGNOSIS — Z9049 Acquired absence of other specified parts of digestive tract: Secondary | ICD-10-CM | POA: Insufficient documentation

## 2015-02-12 DIAGNOSIS — I10 Essential (primary) hypertension: Secondary | ICD-10-CM | POA: Diagnosis not present

## 2015-02-12 DIAGNOSIS — G43909 Migraine, unspecified, not intractable, without status migrainosus: Secondary | ICD-10-CM | POA: Insufficient documentation

## 2015-02-12 DIAGNOSIS — K529 Noninfective gastroenteritis and colitis, unspecified: Secondary | ICD-10-CM | POA: Diagnosis not present

## 2015-02-12 DIAGNOSIS — K219 Gastro-esophageal reflux disease without esophagitis: Secondary | ICD-10-CM | POA: Insufficient documentation

## 2015-02-12 DIAGNOSIS — G40909 Epilepsy, unspecified, not intractable, without status epilepticus: Secondary | ICD-10-CM | POA: Insufficient documentation

## 2015-02-12 DIAGNOSIS — E669 Obesity, unspecified: Secondary | ICD-10-CM | POA: Diagnosis not present

## 2015-02-12 DIAGNOSIS — Z79899 Other long term (current) drug therapy: Secondary | ICD-10-CM | POA: Diagnosis not present

## 2015-02-12 DIAGNOSIS — M1990 Unspecified osteoarthritis, unspecified site: Secondary | ICD-10-CM | POA: Diagnosis not present

## 2015-02-12 DIAGNOSIS — Z86718 Personal history of other venous thrombosis and embolism: Secondary | ICD-10-CM | POA: Insufficient documentation

## 2015-02-12 DIAGNOSIS — R197 Diarrhea, unspecified: Secondary | ICD-10-CM | POA: Diagnosis not present

## 2015-02-12 DIAGNOSIS — I252 Old myocardial infarction: Secondary | ICD-10-CM | POA: Diagnosis not present

## 2015-02-12 DIAGNOSIS — E119 Type 2 diabetes mellitus without complications: Secondary | ICD-10-CM | POA: Diagnosis not present

## 2015-02-12 DIAGNOSIS — R52 Pain, unspecified: Secondary | ICD-10-CM

## 2015-02-12 DIAGNOSIS — Z8673 Personal history of transient ischemic attack (TIA), and cerebral infarction without residual deficits: Secondary | ICD-10-CM | POA: Insufficient documentation

## 2015-02-12 DIAGNOSIS — Z791 Long term (current) use of non-steroidal anti-inflammatories (NSAID): Secondary | ICD-10-CM | POA: Insufficient documentation

## 2015-02-12 DIAGNOSIS — Z8711 Personal history of peptic ulcer disease: Secondary | ICD-10-CM | POA: Insufficient documentation

## 2015-02-12 DIAGNOSIS — G4730 Sleep apnea, unspecified: Secondary | ICD-10-CM | POA: Insufficient documentation

## 2015-02-12 DIAGNOSIS — R1110 Vomiting, unspecified: Secondary | ICD-10-CM | POA: Diagnosis present

## 2015-02-12 DIAGNOSIS — Z87442 Personal history of urinary calculi: Secondary | ICD-10-CM | POA: Diagnosis not present

## 2015-02-12 DIAGNOSIS — Z951 Presence of aortocoronary bypass graft: Secondary | ICD-10-CM | POA: Insufficient documentation

## 2015-02-12 DIAGNOSIS — R110 Nausea: Secondary | ICD-10-CM | POA: Diagnosis not present

## 2015-02-12 DIAGNOSIS — Z90710 Acquired absence of both cervix and uterus: Secondary | ICD-10-CM | POA: Insufficient documentation

## 2015-02-12 DIAGNOSIS — Z950 Presence of cardiac pacemaker: Secondary | ICD-10-CM | POA: Diagnosis not present

## 2015-02-12 DIAGNOSIS — J441 Chronic obstructive pulmonary disease with (acute) exacerbation: Secondary | ICD-10-CM | POA: Diagnosis not present

## 2015-02-12 LAB — HEPATIC FUNCTION PANEL
ALBUMIN: 4.1 g/dL (ref 3.5–5.2)
ALT: 15 U/L (ref 0–35)
AST: 26 U/L (ref 0–37)
Alkaline Phosphatase: 72 U/L (ref 39–117)
BILIRUBIN DIRECT: 0.1 mg/dL (ref 0.0–0.5)
BILIRUBIN TOTAL: 0.8 mg/dL (ref 0.3–1.2)
Indirect Bilirubin: 0.7 mg/dL (ref 0.3–0.9)
TOTAL PROTEIN: 8.6 g/dL — AB (ref 6.0–8.3)

## 2015-02-12 LAB — URINALYSIS, ROUTINE W REFLEX MICROSCOPIC
Bilirubin Urine: NEGATIVE
Glucose, UA: NEGATIVE mg/dL
Ketones, ur: NEGATIVE mg/dL
Leukocytes, UA: NEGATIVE
Nitrite: NEGATIVE
Protein, ur: 30 mg/dL — AB
Specific Gravity, Urine: 1.015 (ref 1.005–1.030)
UROBILINOGEN UA: 0.2 mg/dL (ref 0.0–1.0)
pH: 5.5 (ref 5.0–8.0)

## 2015-02-12 LAB — CBC WITH DIFFERENTIAL/PLATELET
Basophils Absolute: 0 10*3/uL (ref 0.0–0.1)
Basophils Relative: 0 % (ref 0–1)
Eosinophils Absolute: 0 10*3/uL (ref 0.0–0.7)
Eosinophils Relative: 0 % (ref 0–5)
HEMATOCRIT: 32.6 % — AB (ref 36.0–46.0)
Hemoglobin: 10.6 g/dL — ABNORMAL LOW (ref 12.0–15.0)
Lymphocytes Relative: 10 % — ABNORMAL LOW (ref 12–46)
Lymphs Abs: 0.9 10*3/uL (ref 0.7–4.0)
MCH: 25.8 pg — ABNORMAL LOW (ref 26.0–34.0)
MCHC: 32.5 g/dL (ref 30.0–36.0)
MCV: 79.3 fL (ref 78.0–100.0)
Monocytes Absolute: 0.3 10*3/uL (ref 0.1–1.0)
Monocytes Relative: 3 % (ref 3–12)
NEUTROS ABS: 7.7 10*3/uL (ref 1.7–7.7)
Neutrophils Relative %: 87 % — ABNORMAL HIGH (ref 43–77)
Platelets: 324 10*3/uL (ref 150–400)
RBC: 4.11 MIL/uL (ref 3.87–5.11)
RDW: 16.4 % — AB (ref 11.5–15.5)
WBC: 8.9 10*3/uL (ref 4.0–10.5)

## 2015-02-12 LAB — BASIC METABOLIC PANEL
Anion gap: 12 (ref 5–15)
BUN: 15 mg/dL (ref 6–23)
CHLORIDE: 106 mmol/L (ref 96–112)
CO2: 21 mmol/L (ref 19–32)
CREATININE: 1.29 mg/dL — AB (ref 0.50–1.10)
Calcium: 10.3 mg/dL (ref 8.4–10.5)
GFR calc non Af Amer: 39 mL/min — ABNORMAL LOW (ref >90)
GFR, EST AFRICAN AMERICAN: 46 mL/min — AB (ref >90)
GLUCOSE: 151 mg/dL — AB (ref 70–99)
POTASSIUM: 3.5 mmol/L (ref 3.5–5.1)
Sodium: 139 mmol/L (ref 135–145)

## 2015-02-12 LAB — URINE MICROSCOPIC-ADD ON

## 2015-02-12 MED ORDER — DICYCLOMINE HCL 20 MG PO TABS: ORAL_TABLET | ORAL | Status: DC

## 2015-02-12 MED ORDER — ONDANSETRON 4 MG PO TBDP: ORAL_TABLET | ORAL | Status: DC

## 2015-02-12 MED ORDER — SODIUM CHLORIDE 0.9 % IV SOLN
1000.0000 mL | INTRAVENOUS | Status: DC
  Administered 2015-02-12: 1000 mL via INTRAVENOUS

## 2015-02-12 MED ORDER — SODIUM CHLORIDE 0.9 % IV SOLN
1000.0000 mL | Freq: Once | INTRAVENOUS | Status: AC
  Administered 2015-02-12: 1000 mL via INTRAVENOUS

## 2015-02-12 MED ORDER — HEPARIN SOD (PORK) LOCK FLUSH 100 UNIT/ML IV SOLN
500.0000 [IU] | Freq: Once | INTRAVENOUS | Status: AC
Start: 2015-02-12 — End: 2015-02-12
  Administered 2015-02-12: 500 [IU] via INTRAVENOUS
  Filled 2015-02-12: qty 5

## 2015-02-12 MED ORDER — ONDANSETRON HCL 4 MG/2ML IJ SOLN
4.0000 mg | Freq: Once | INTRAMUSCULAR | Status: AC
  Administered 2015-02-12: 4 mg via INTRAVENOUS
  Filled 2015-02-12: qty 2

## 2015-02-12 NOTE — ED Provider Notes (Signed)
CSN: [id number]     Arrival date & time 02/12/15  0559 History  This chart was scribed for Christine BerkshireJoseph Annastacia Duba, MD by Tonye RoyaltyJoshua Chen, ED Scribe. This patient was seen in room APA19/AP[facility]19 and the patient's care was started at 7:12 AM.    Chief Complaint  Patient presents with  . Emesis   Patient is a 74 y.o. female presenting with vomiting. The history is provided by the patient. No language interpreter was used.  Emesis Severity:  Moderate Duration:  1 day Timing:  Constant Number of daily episodes:  4-5 Quality:  Unable to specify Able to tolerate:  Liquids and solids Progression:  Unchanged Chronicity:  New Recent urination:  Normal Context: not post-tussive and not self-induced   Relieved by:  Nothing Worsened by:  Nothing tried Ineffective treatments:  None tried Associated symptoms: abdominal pain and diarrhea   Associated symptoms: no headaches     HPI Comments: [name] is a [age] y.o. female who presents to the Emergency Department complaining of nausea, vomiting, and diarrhea with onset yesterday. She reports associated lower abdominal pain. She denies any blood in her emesis and is unsure if she has any in her diarrhea. She states she had diarrhea "all day" yesterday and had 4-5 counts of emesis. She denies sick contats.  Past Medical History  Diagnosis Date  . Hypertension   . CAD (coronary artery disease)     multivessel, CABG 6/10.  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  Myoview (11/20/11) showed no ischemia & EF 62%  . Hyperlipidemia   . Deep vein thrombosis, upper right extremity     post-PPM  . Obesity   . Dehiscence of closure of sternum or sternotomy 07/2009    sternal infection; required flap closure  . COPD (chronic obstructive pulmonary disease)     severe lung disease by PFTs 6/12  . Seizure disorder   . 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 PUD (peptic ulcer disease)   . Pacemaker   . Sleep apnea     denies CPAP use on  09/30/2013  . Diabetes mellitus, type II   . H/O hiatal hernia   . Migraines     "years ago" (09/30/2013)  . DDD (degenerative disc disease)   . Arthritis     "legs and back" (09/30/2013)  . Chronic lower back pain   . Urge incontinence of urine   . Myocardial infarction     years ago  . Anginal pain     "all the time, but not heart related"  . Asthma     years ago  . Orthopnea     sometimes  . 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 History of kidney stones   . Gastroesophageal reflux disease     not much  . Seizures     "long time ago; don't remember what they were related to" (09/30/2013)none recent  . Anemia   . Blurred vision, bilateral     sometimes double vision   Past Surgical History  Procedure Laterality Date  . Pacemaker placement  1993    Status post DDD pacemaker implantation in 1993, with Medtronic Kappa generator change in 2003 for  second-degree AV block, most recent gen change by Fawn KirkJA 04/14/11  . Total hip arthroplasty Right   . Replacement total knee Bilateral   . Thyroidectomy    . Wrist surgery Left     "tumor taken  off" (09/30/2013)  . Coronary artery bypass graft  05/1999    - LIMA to LAD, SVG to OM, SVG to PDA  . Reconstructive repair sternal      for infection s/p CABG 8/10  . Esophagogastroduodenoscopy      with [facility] dilation,  biopsy, and disruption Schatzki's ring  . Colonoscopy  04/27/2005    hemorrhoids, diverticula  . Esophagogastroduodenoscopy  09/10/2012    RMR: Noncritical Schatzki's ring. Diffuse gastric erosions and an  area of partially healing ulceration-status post biopsy/ Small hiatal hernia. Bx reactive gastropathy.  . Colonoscopy with esophagogastroduodenoscopy (egd) N/A 02/20/2013    RMR: pancolonic diverticulosis and internal hemorrhoids. EGD with small hiatal hernia, healed gastric ulcer  . Portacath placement Left 07/17/2013    Procedure: INSERTION PORT-A-CATH-left subclavian;  Surgeon: Fabio Bering, MD;  Location:  [facility];  Service: General;  Laterality: Left;  . Appendectomy    . Cholecystectomy    . Insert / replace / remove pacemaker  1993  . Lumbar disc surgery    . Posterior lumbar fusion    . Tubal ligation    . Cesarean section  ~1980  . Abdominal hysterectomy      partial  . Back surgery      x3 total  . Total hip arthroplasty Left 12/03/2013    Procedure: LEFT TOTAL HIP ARTHROPLASTY ANTERIOR APPROACH;  Surgeon: Shelda Pal, MD;  Location: [facility];  Service: Orthopedics;  Laterality: Left;  . Joint replacement     Family History  Problem Relation Age of Onset  . Diabetes    . Heart disease Mother     MI  . Coronary artery disease      Female <55  . Cancer    . Colon cancer Father     age 26   History  Substance Use Topics  . Smoking status: Never Smoker   . Smokeless tobacco: Former Neurosurgeon    Types: Snuff     Comment: 09/30/2013 "stopped snuff a long time ago"  . Alcohol Use: No   OB History    No data available     Review of Systems  Constitutional: Negative for appetite change and fatigue.  HENT: Negative for congestion, ear discharge and sinus pressure.   Eyes: Negative for discharge.  Respiratory: Negative for cough.   Cardiovascular: Negative for chest pain.  Gastrointestinal: Positive for nausea, vomiting, abdominal pain and diarrhea.  Genitourinary: Negative for frequency and hematuria.  Musculoskeletal: Negative for back pain.  Skin: Negative for rash.  Neurological: Negative for seizures and headaches.  Psychiatric/Behavioral: Negative for hallucinations.      Allergies  Codeine; Other; Oxycodone hcl; Peanuts; and Reglan  Home Medications   Prior to Admission medications   Medication Sig Start Date End Date Taking? Authorizing Provider  apixaban (ELIQUIS) 5 MG TABS tablet Take 5 mg by mouth once.   Yes Historical Provider, MD  acetaminophen (TYLENOL) 325 MG tablet Take 1-2 tablets (325-650 mg total) by mouth every 6 (six) hours as needed. Patient  taking differently: Take 325-650 mg by mouth every 6 (six) hours as needed for mild pain.  12/06/13   Lanney Gins, PA-C  aspirin 81 MG tablet Take 81 mg by mouth daily.    Historical Provider, MD  atorvastatin (LIPITOR) 40 MG tablet Take 1 tablet (40 mg total) by mouth daily. 09/10/14   Antoine Poche, MD  benazepril (LOTENSIN) 40 MG tablet Take 40 mg by mouth every morning.    Historical Provider,  MD  docusate sodium 100 MG CAPS Take 100 mg by mouth 2 (two) times daily. 12/06/13   Lanney Gins, PA-C  doxepin (SINEQUAN) 10 MG capsule Take 10 mg by mouth at bedtime.    Historical Provider, MD  furosemide (LASIX) 20 MG tablet TAKE 1 TABLET BY MOUTH IN THE MORNING. 10/08/14   Marinus Maw, MD  ketorolac (ACULAR) 0.5 % ophthalmic solution Place 1 drop into the right eye 3 (three) times daily.    Historical Provider, MD  levETIRAcetam (KEPPRA) 500 MG tablet Take 500 mg by mouth 2 (two) times daily.    Historical Provider, MD  lubiprostone (AMITIZA) 24 MCG capsule Take 24 mcg by mouth 2 (two) times daily with a meal.    Historical Provider, MD  magnesium hydroxide (MILK OF MAGNESIA) 400 MG/5ML suspension Take 30 mLs by mouth daily as needed for mild constipation.    Historical Provider, MD  metFORMIN (GLUCOPHAGE) 500 MG tablet Take 500 mg by mouth 2 (two) times daily with a meal.     Historical Provider, MD  metoprolol tartrate (LOPRESSOR) 25 MG tablet Take 25 mg by mouth 2 (two) times daily.     Historical Provider, MD  nitroGLYCERIN (NITROSTAT) 0.4 MG SL tablet Place 1 tablet (0.4 mg total) under the tongue every 5 (five) minutes as needed for chest pain. 05/20/14   Antoine Poche, MD  omega-3 acid ethyl esters (LOVAZA) 1 G capsule Take 2 g by mouth 2 (two) times daily.     Historical Provider, MD  oxybutynin (DITROPAN-XL) 10 MG 24 hr tablet Take 10 mg by mouth every morning.     Historical Provider, MD  pantoprazole (PROTONIX) 40 MG tablet Take 1 tablet (40 mg total) by mouth daily. 12/03/13    Nira Retort, NP  polyethylene glycol [facility]) packet Take 17 g by mouth 2 (two) times daily. Patient taking differently: Take 17 g by mouth daily as needed for mild constipation.  12/06/13   Lanney Gins, PA-C  potassium chloride (K-DUR,KLOR-CON) 10 MEQ tablet Take 10 mEq by mouth daily.    Historical Provider, MD  traMADol (ULTRAM) 50 MG tablet Take 1-2 tablets (50-100 mg total) by mouth every 6 (six) hours as needed for moderate pain. 12/06/13   Lanney Gins, PA-C   BP 193/80 mmHg  Pulse 90  Temp(Src) 99 F (37.2 C) (Oral)  Resp 22  Ht  (1.575 m)  Wt 200 lb (90.719 kg)  BMI 36.57 kg/m2  SpO2 98% Physical Exam  Constitutional: She is oriented to person, place, and time. She appears well-developed.  HENT:  Head: Normocephalic.  Eyes: Conjunctivae and EOM are normal. No scleral icterus.  Neck: Neck supple. No thyromegaly present.  Cardiovascular: Normal rate and regular rhythm.  Exam reveals no gallop and no friction rub.   No murmur heard. Pulmonary/Chest: No stridor. She has wheezes (Minimal wheezing bialterally). She has no rales. She exhibits no tenderness.  Abdominal: She exhibits no distension. There is tenderness (Small tenderness throughout abdomen). There is no rebound.  Musculoskeletal: Normal range of motion. She exhibits no edema.  Lymphadenopathy:    She has no cervical adenopathy.  Neurological: She is oriented to person, place, and time. She exhibits normal muscle tone. Coordination normal.  Skin: No rash noted. No erythema.  Psychiatric: She has a normal mood and affect. Her behavior is normal.  Nursing note and vitals reviewed.   ED Course  Procedures (including critical care time)  DIAGNOSTIC STUDIES: Oxygen Saturation is  98% on room air, normal by my interpretation.    COORDINATION OF CARE: 7:13 AM Discussed treatment plan with patient at beside, the patient agrees with the plan and has no further questions at this time.   Labs  Review Labs Reviewed  BASIC METABOLIC PANEL  CBC WITH DIFFERENTIAL/PLATELET  URINALYSIS, ROUTINE W REFLEX MICROSCOPIC    Imaging Review No results found.   EKG Interpretation None      MDM   Final diagnoses:  None  gastrorenteritis,  tx with zofran and bentyl  The chart was scribed for me under my direct supervision.  I personally performed the history, physical, and medical decision making and all procedures in the evaluation of this patient.Christine Berkshire, MD 02/12/15 [phone]

## 2015-02-12 NOTE — ED Notes (Signed)
Patient is unable to urinate at this time 

## 2015-02-12 NOTE — ED Notes (Signed)
Port in left chest accessed using sterile technique. Patient tolerated procedure well. Labs drawn from port without difficulty and port flushes well without resistance. IVF initiated per orders.

## 2015-02-12 NOTE — ED Notes (Signed)
Daughter called and informed of discharge. She will be here to get patient shortly.

## 2015-02-12 NOTE — ED Notes (Signed)
Still awaiting family to pick patient up. Patient resting in bed. No distress.

## 2015-02-12 NOTE — ED Notes (Signed)
Patient with no complaints at this time. Respirations even and unlabored. Skin warm/dry. Discharge instructions reviewed with patient at this time. Patient given opportunity to voice concerns/ask questions. Patient discharged at this time and left Emergency Department with steady gait.   

## 2015-02-12 NOTE — ED Notes (Signed)
Pt. Reports nausea/vomiting/diarrhea starting last night. Pt. Reports lower abdominal pain.

## 2015-02-12 NOTE — ED Notes (Signed)
Port de-accessed by RN due to discharge. Flushed with 5 mls of Heparin per protocol. Access removed and intact. No bleeding noted at site. Band-aid applied.

## 2015-02-12 NOTE — Discharge Instructions (Signed)
Drink plenty of fluids and follow up with your md next week °

## 2015-02-24 DIAGNOSIS — I4891 Unspecified atrial fibrillation: Secondary | ICD-10-CM | POA: Diagnosis not present

## 2015-02-24 DIAGNOSIS — R040 Epistaxis: Secondary | ICD-10-CM | POA: Diagnosis not present

## 2015-02-24 DIAGNOSIS — I10 Essential (primary) hypertension: Secondary | ICD-10-CM | POA: Diagnosis not present

## 2015-02-24 DIAGNOSIS — E1121 Type 2 diabetes mellitus with diabetic nephropathy: Secondary | ICD-10-CM | POA: Diagnosis not present

## 2015-03-05 ENCOUNTER — Encounter: Payer: Self-pay | Admitting: *Deleted

## 2015-03-09 DIAGNOSIS — I110 Hypertensive heart disease with heart failure: Secondary | ICD-10-CM | POA: Diagnosis not present

## 2015-03-09 DIAGNOSIS — M79672 Pain in left foot: Secondary | ICD-10-CM | POA: Diagnosis not present

## 2015-03-09 DIAGNOSIS — E1121 Type 2 diabetes mellitus with diabetic nephropathy: Secondary | ICD-10-CM | POA: Diagnosis not present

## 2015-03-09 DIAGNOSIS — I10 Essential (primary) hypertension: Secondary | ICD-10-CM | POA: Diagnosis not present

## 2015-03-29 IMAGING — CR DG CHEST 1V PORT
1 series · 1 of 1 positions shown · non-contrast
Comparison: 07/22/2013

CLINICAL DATA: Chest pain

EXAM:
PORTABLE CHEST - 1 VIEW

[AP]
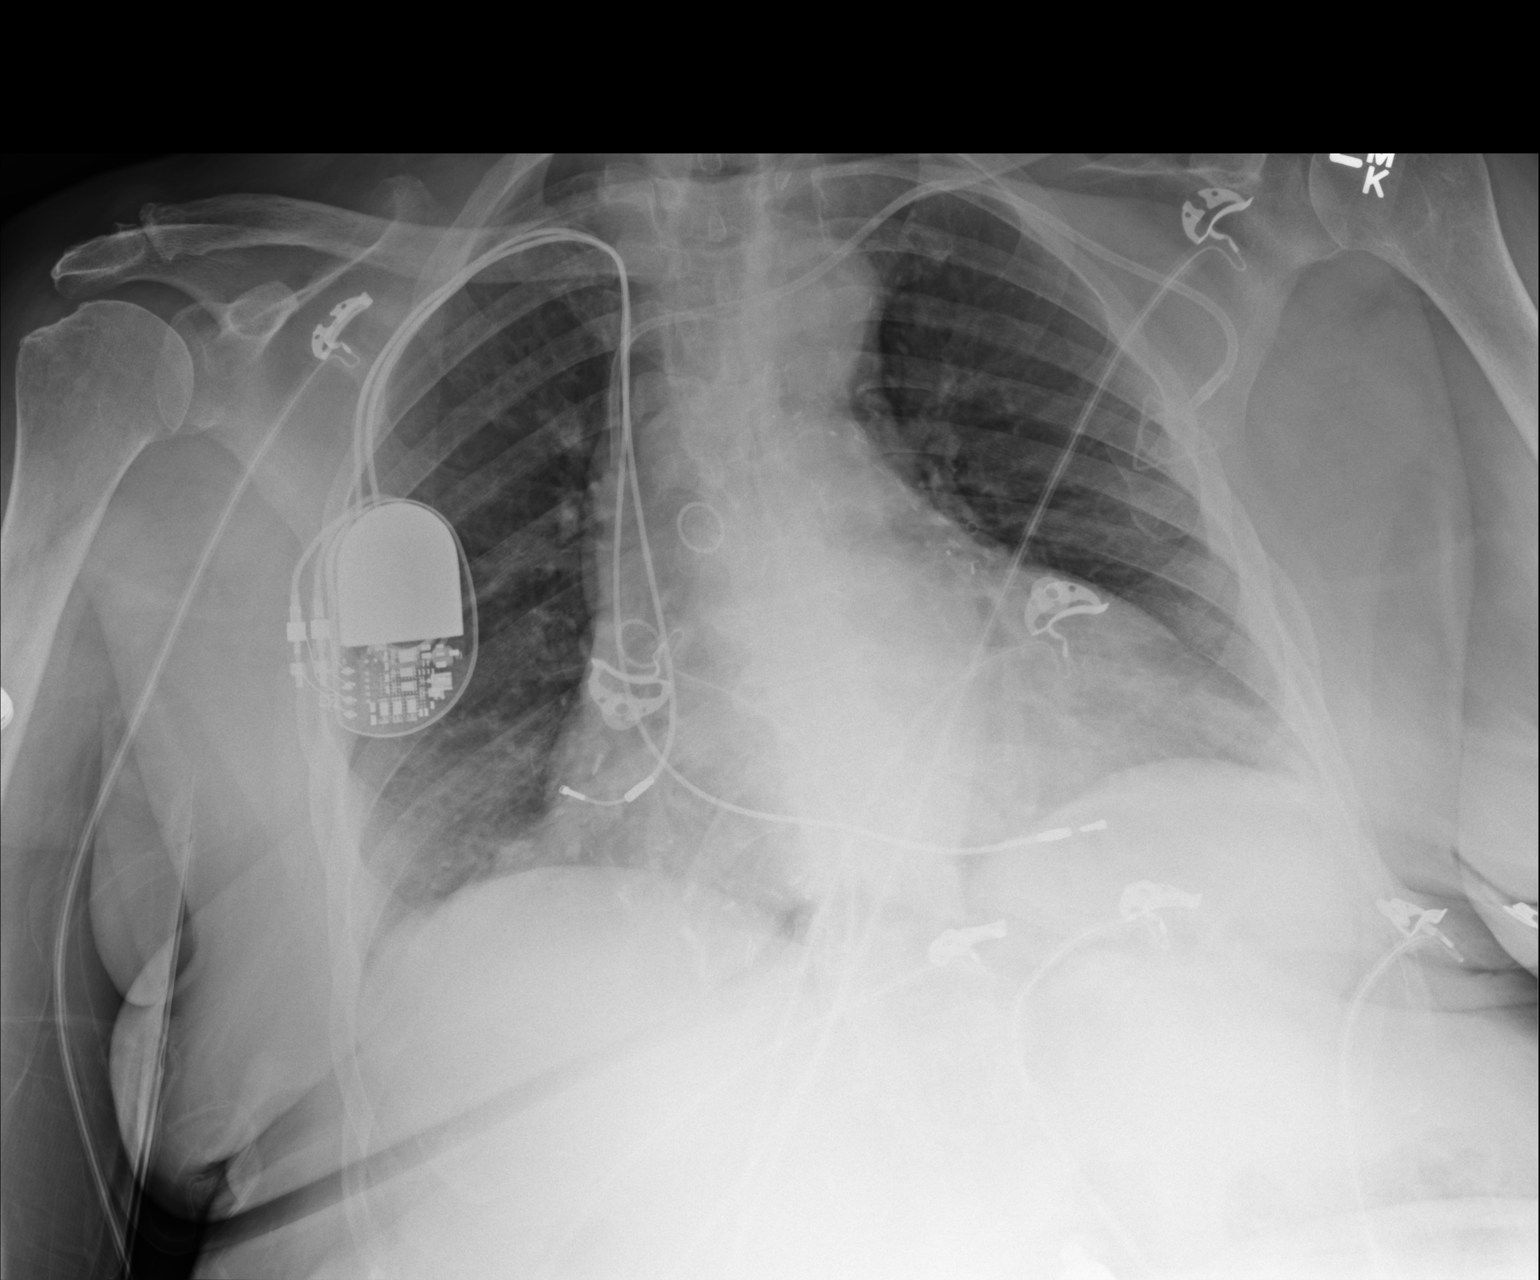

[1 of 1 positions shown; findings below may reference images not displayed]

FINDINGS: Cardiac shadow remains enlarged. Postsurgical changes are seen. A
left-sided chest wall port is again noted. A pacing device is again
seen. The lungs are clear bilaterally.
IMPRESSION: No acute abnormality noted.

## 2015-04-01 ENCOUNTER — Emergency Department (HOSPITAL_COMMUNITY)
Admission: EM | Admit: 2015-04-01 | Discharge: 2015-04-01 | Disposition: A | Discharge status: Home / Self Care | Payer: Commercial Managed Care - HMO | Attending: Emergency Medicine | Admitting: Emergency Medicine

## 2015-04-01 ENCOUNTER — Encounter: Payer: Self-pay | Admitting: *Deleted

## 2015-04-01 ENCOUNTER — Emergency Department (HOSPITAL_COMMUNITY): Payer: Commercial Managed Care - HMO

## 2015-04-01 ENCOUNTER — Encounter (HOSPITAL_COMMUNITY): Payer: Self-pay | Admitting: Emergency Medicine

## 2015-04-01 DIAGNOSIS — E119 Type 2 diabetes mellitus without complications: Secondary | ICD-10-CM | POA: Diagnosis not present

## 2015-04-01 DIAGNOSIS — E785 Hyperlipidemia, unspecified: Secondary | ICD-10-CM | POA: Insufficient documentation

## 2015-04-01 DIAGNOSIS — Z950 Presence of cardiac pacemaker: Secondary | ICD-10-CM | POA: Diagnosis not present

## 2015-04-01 DIAGNOSIS — Z7982 Long term (current) use of aspirin: Secondary | ICD-10-CM | POA: Insufficient documentation

## 2015-04-01 DIAGNOSIS — E669 Obesity, unspecified: Secondary | ICD-10-CM | POA: Diagnosis not present

## 2015-04-01 DIAGNOSIS — M19072 Primary osteoarthritis, left ankle and foot: Secondary | ICD-10-CM | POA: Diagnosis not present

## 2015-04-01 DIAGNOSIS — Z87828 Personal history of other (healed) physical injury and trauma: Secondary | ICD-10-CM | POA: Diagnosis not present

## 2015-04-01 DIAGNOSIS — G43909 Migraine, unspecified, not intractable, without status migrainosus: Secondary | ICD-10-CM | POA: Insufficient documentation

## 2015-04-01 DIAGNOSIS — G40909 Epilepsy, unspecified, not intractable, without status epilepticus: Secondary | ICD-10-CM | POA: Insufficient documentation

## 2015-04-01 DIAGNOSIS — Z8711 Personal history of peptic ulcer disease: Secondary | ICD-10-CM | POA: Diagnosis not present

## 2015-04-01 DIAGNOSIS — J441 Chronic obstructive pulmonary disease with (acute) exacerbation: Secondary | ICD-10-CM | POA: Insufficient documentation

## 2015-04-01 DIAGNOSIS — I252 Old myocardial infarction: Secondary | ICD-10-CM | POA: Insufficient documentation

## 2015-04-01 DIAGNOSIS — Z79899 Other long term (current) drug therapy: Secondary | ICD-10-CM | POA: Insufficient documentation

## 2015-04-01 DIAGNOSIS — M1990 Unspecified osteoarthritis, unspecified site: Secondary | ICD-10-CM | POA: Insufficient documentation

## 2015-04-01 DIAGNOSIS — Z87442 Personal history of urinary calculi: Secondary | ICD-10-CM | POA: Insufficient documentation

## 2015-04-01 DIAGNOSIS — M549 Dorsalgia, unspecified: Secondary | ICD-10-CM | POA: Insufficient documentation

## 2015-04-01 DIAGNOSIS — M79672 Pain in left foot: Secondary | ICD-10-CM | POA: Diagnosis not present

## 2015-04-01 DIAGNOSIS — Z8673 Personal history of transient ischemic attack (TIA), and cerebral infarction without residual deficits: Secondary | ICD-10-CM | POA: Insufficient documentation

## 2015-04-01 DIAGNOSIS — Z862 Personal history of diseases of the blood and blood-forming organs and certain disorders involving the immune mechanism: Secondary | ICD-10-CM | POA: Diagnosis not present

## 2015-04-01 DIAGNOSIS — Z951 Presence of aortocoronary bypass graft: Secondary | ICD-10-CM | POA: Diagnosis not present

## 2015-04-01 DIAGNOSIS — R109 Unspecified abdominal pain: Secondary | ICD-10-CM | POA: Diagnosis not present

## 2015-04-01 DIAGNOSIS — Z7902 Long term (current) use of antithrombotics/antiplatelets: Secondary | ICD-10-CM | POA: Diagnosis not present

## 2015-04-01 DIAGNOSIS — M7662 Achilles tendinitis, left leg: Secondary | ICD-10-CM | POA: Diagnosis not present

## 2015-04-01 DIAGNOSIS — Z86718 Personal history of other venous thrombosis and embolism: Secondary | ICD-10-CM | POA: Diagnosis not present

## 2015-04-01 DIAGNOSIS — I25119 Atherosclerotic heart disease of native coronary artery with unspecified angina pectoris: Secondary | ICD-10-CM | POA: Diagnosis not present

## 2015-04-01 DIAGNOSIS — M25572 Pain in left ankle and joints of left foot: Secondary | ICD-10-CM

## 2015-04-01 DIAGNOSIS — I10 Essential (primary) hypertension: Secondary | ICD-10-CM | POA: Insufficient documentation

## 2015-04-01 DIAGNOSIS — G8929 Other chronic pain: Secondary | ICD-10-CM | POA: Diagnosis not present

## 2015-04-01 DIAGNOSIS — M2012 Hallux valgus (acquired), left foot: Secondary | ICD-10-CM | POA: Diagnosis not present

## 2015-04-01 DIAGNOSIS — M85872 Other specified disorders of bone density and structure, left ankle and foot: Secondary | ICD-10-CM | POA: Diagnosis not present

## 2015-04-01 DIAGNOSIS — M7732 Calcaneal spur, left foot: Secondary | ICD-10-CM | POA: Diagnosis not present

## 2015-04-01 MED ORDER — ONDANSETRON HCL 4 MG PO TABS
4.0000 mg | ORAL_TABLET | Freq: Once | ORAL | Status: AC
  Administered 2015-04-01: 4 mg via ORAL
  Filled 2015-04-01: qty 1

## 2015-04-01 MED ORDER — ONDANSETRON HCL 4 MG PO TABS: ORAL_TABLET | ORAL | Status: DC

## 2015-04-01 MED ORDER — HYDROCODONE-ACETAMINOPHEN 5-325 MG PO TABS: 1.0000 | ORAL_TABLET | ORAL | Status: DC | PRN

## 2015-04-01 MED ORDER — HYDROCODONE-ACETAMINOPHEN 5-325 MG PO TABS
1.0000 | ORAL_TABLET | Freq: Once | ORAL | Status: AC
  Administered 2015-04-01: 1 via ORAL
  Filled 2015-04-01: qty 1

## 2015-04-01 NOTE — ED Notes (Signed)
Pt reports left ankle/foot pain for last two weeks. Pt has been seen by PCP x4 times for same complaint with no relief. Pt denies any known injury. EMS admin 960mg  of tylenol en route. nad noted.

## 2015-04-01 NOTE — Discharge Instructions (Signed)
Arthritis, Nonspecific °Arthritis is inflammation of a joint. This usually means pain, redness, warmth or swelling are present. One or more joints may be involved. There are a number of types of arthritis. Your caregiver may not be able to tell what type of arthritis you have right away. °CAUSES  °The most common cause of arthritis is the wear and tear on the joint (osteoarthritis). This causes damage to the cartilage, which can break down over time. The knees, hips, back and neck are most often affected by this type of arthritis. °Other types of arthritis and common causes of joint pain include: °· Sprains and other injuries near the joint. Sometimes minor sprains and injuries cause pain and swelling that develop hours later. °· Rheumatoid arthritis. This affects hands, feet and knees. It usually affects both sides of your body at the same time. It is often associated with chronic ailments, fever, weight loss and general weakness. °· Crystal arthritis. Gout and pseudo gout can cause occasional acute severe pain, redness and swelling in the foot, ankle, or knee. °· Infectious arthritis. Bacteria can get into a joint through a break in overlying skin. This can cause infection of the joint. Bacteria and viruses can also spread through the blood and affect your joints. °· Drug, infectious and allergy reactions. Sometimes joints can become mildly painful and slightly swollen with these types of illnesses. °SYMPTOMS  °· Pain is the main symptom. °· Your joint or joints can also be red, swollen and warm or hot to the touch. °· You may have a fever with certain types of arthritis, or even feel overall ill. °· The joint with arthritis will hurt with movement. Stiffness is present with some types of arthritis. °DIAGNOSIS  °Your caregiver will suspect arthritis based on your description of your symptoms and on your exam. Testing may be needed to find the type of arthritis: °· Blood and sometimes urine tests. °· X-ray tests  and sometimes CT or MRI scans. °· Removal of fluid from the joint (arthrocentesis) is done to check for bacteria, crystals or other causes. Your caregiver (or a specialist) will numb the area over the joint with a local anesthetic, and use a needle to remove joint fluid for examination. This procedure is only minimally uncomfortable. °· Even with these tests, your caregiver may not be able to tell what kind of arthritis you have. Consultation with a specialist (rheumatologist) may be helpful. °TREATMENT  °Your caregiver will discuss with you treatment specific to your type of arthritis. If the specific type cannot be determined, then the following general recommendations may apply. °Treatment of severe joint pain includes: °· Rest. °· Elevation. °· Anti-inflammatory medication (for example, ibuprofen) may be prescribed. Avoiding activities that cause increased pain. °· Only take over-the-counter or prescription medicines for pain and discomfort as recommended by your caregiver. °· Cold packs over an inflamed joint may be used for 10 to 15 minutes every hour. Hot packs sometimes feel better, but do not use overnight. Do not use hot packs if you are diabetic without your caregiver's permission. °· A cortisone shot into arthritic joints may help reduce pain and swelling. °· Any acute arthritis that gets worse over the next 1 to 2 days needs to be looked at to be sure there is no joint infection. °Long-term arthritis treatment involves modifying activities and lifestyle to reduce joint stress jarring. This can include weight loss. Also, exercise is needed to nourish the joint cartilage and remove waste. This helps keep the muscles   around the joint strong. °HOME CARE INSTRUCTIONS  °· Do not take aspirin to relieve pain if gout is suspected. This elevates uric acid levels. °· Only take over-the-counter or prescription medicines for pain, discomfort or fever as directed by your caregiver. °· Rest the joint as much as  possible. °· If your joint is swollen, keep it elevated. °· Use crutches if the painful joint is in your leg. °· Drinking plenty of fluids may help for certain types of arthritis. °· Follow your caregiver's dietary instructions. °· Try low-impact exercise such as: °¨ Swimming. °¨ Water aerobics. °¨ Biking. °¨ Walking. °· Morning stiffness is often relieved by a warm shower. °· Put your joints through regular range-of-motion. °SEEK MEDICAL CARE IF:  °· You do not feel better in 24 hours or are getting worse. °· You have side effects to medications, or are not getting better with treatment. °SEEK IMMEDIATE MEDICAL CARE IF:  °· You have a fever. °· You develop severe joint pain, swelling or redness. °· Many joints are involved and become painful and swollen. °· There is severe back pain and/or leg weakness. °· You have loss of bowel or bladder control. °Document Released: 12/29/2004 Document Revised: 02/13/2012 Document Reviewed: 01/14/2009 °[employer]® Patient Information ©2015 [employer]. This information is not intended to replace advice given to you by your health care provider. Make sure you discuss any questions you have with your health care provider. ° °

## 2015-04-01 NOTE — ED Provider Notes (Signed)
CSN: [id number]     Arrival date & time 04/01/15  1211 History   First MD Initiated Contact with Patient 04/01/15 1220     Chief Complaint  Patient presents with  . Ankle Pain     (Consider location/radiation/quality/duration/timing/severity/associated sxs/prior Treatment) Patient is a 74 y.o. female presenting with ankle pain. The history is provided by the patient.  Ankle Pain Location:  Ankle Ankle location:  L ankle Pain details:    Quality:  Aching and burning   Severity:  Severe   Onset quality:  Gradual   Duration:  2 weeks   Timing:  Intermittent   Progression:  Worsening Dislocation: no   Foreign body present:  No foreign bodies Relieved by:  Nothing Worsened by:  Bearing weight Associated symptoms: back pain, decreased ROM and stiffness   Associated symptoms: no fever   Risk factors: no frequent fractures     Past Medical History  Diagnosis Date  . Hypertension   . CAD (coronary artery disease)     multivessel, CABG 6/10.  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  Myoview (11/20/11) showed no ischemia & EF 62%  . Hyperlipidemia   . Deep vein thrombosis, upper right extremity     post-PPM  . Obesity   . Dehiscence of closure of sternum or sternotomy 07/2009    sternal infection; required flap closure  . COPD (chronic obstructive pulmonary disease)     severe lung disease by PFTs 6/12  . Seizure disorder   . 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 PUD (peptic ulcer disease)   . Pacemaker   . Sleep apnea     denies CPAP use on 09/30/2013  . Diabetes mellitus, type II   . H/O hiatal hernia   . Migraines     "years ago" (09/30/2013)  . DDD (degenerative disc disease)   . Arthritis     "legs and back" (09/30/2013)  . Chronic lower back pain   . Urge incontinence of urine   . Myocardial infarction     years ago  . Anginal pain     "all the time, but not heart related"  . Asthma     years ago  . Orthopnea     sometimes  .  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 History of kidney stones   . Gastroesophageal reflux disease     not much  . Seizures     "long time ago; don't remember what they were related to" (09/30/2013)none recent  . Anemia   . Blurred vision, bilateral     sometimes double vision   Past Surgical History  Procedure Laterality Date  . Pacemaker placement  1993    Status post DDD pacemaker implantation in 1993, with Medtronic Kappa generator change in 2003 for  second-degree AV block, most recent gen change by Fawn Kirk 04/14/11  . Total hip arthroplasty Right   . Replacement total knee Bilateral   . Thyroidectomy    . Wrist surgery Left     "tumor taken off" (09/30/2013)  . Coronary artery bypass graft  05/1999    - LIMA to LAD, SVG to OM, SVG to PDA  . Reconstructive repair sternal      for infection s/p CABG 8/10  . Esophagogastroduodenoscopy      with [facility] dilation,  biopsy, and disruption Schatzki's ring  . Colonoscopy  04/27/2005    hemorrhoids, diverticula  . Esophagogastroduodenoscopy  09/10/2012    RMR: Noncritical Schatzki's ring. Diffuse gastric erosions and an  area of partially healing ulceration-status post biopsy/ Small hiatal hernia. Bx reactive gastropathy.  . Colonoscopy with esophagogastroduodenoscopy (egd) N/A 02/20/2013    RMR: pancolonic diverticulosis and internal hemorrhoids. EGD with small hiatal hernia, healed gastric ulcer  . Portacath placement Left 07/17/2013    Procedure: INSERTION PORT-A-CATH-left subclavian;  Surgeon: Fabio Bering, MD;  Location: [facility];  Service: General;  Laterality: Left;  . Appendectomy    . Cholecystectomy    . Insert / replace / remove pacemaker  1993  . Lumbar disc surgery    . Posterior lumbar fusion    . Tubal ligation    . Cesarean section  ~1980  . Abdominal hysterectomy      partial  . Back surgery      x3 total  . Total hip arthroplasty Left 12/03/2013    Procedure: LEFT TOTAL HIP ARTHROPLASTY ANTERIOR APPROACH;   Surgeon: Shelda Pal, MD;  Location: [facility];  Service: Orthopedics;  Laterality: Left;  . Joint replacement     Family History  Problem Relation Age of Onset  . Diabetes    . Heart disease Mother     MI  . Coronary artery disease      Female <55  . Cancer    . Colon cancer Father     age 84   History  Substance Use Topics  . Smoking status: Never Smoker   . Smokeless tobacco: Former Neurosurgeon    Types: Snuff     Comment: 09/30/2013 "stopped snuff a long time ago"  . Alcohol Use: No   OB History    No data available     Review of Systems  Constitutional: Negative for fever.  Respiratory: Positive for shortness of breath.   Gastrointestinal: Positive for abdominal pain.  Musculoskeletal: Positive for back pain, arthralgias and stiffness.  Neurological: Positive for seizures and headaches.  All other systems reviewed and are negative.     Allergies  Codeine; Other; Oxycodone hcl; Peanuts; and Reglan  Home Medications   Prior to Admission medications   Medication Sig Start Date End Date Taking? Authorizing Provider  acetaminophen (TYLENOL) 325 MG tablet Take 1-2 tablets (325-650 mg total) by mouth every 6 (six) hours as needed. Patient taking differently: Take 325-650 mg by mouth every 6 (six) hours as needed for mild pain.  12/06/13  Yes Lanney Gins, PA-C  apixaban (ELIQUIS) 5 MG TABS tablet Take 5 mg by mouth 2 (two) times daily.    Yes Historical Provider, MD  aspirin 81 MG tablet Take 81 mg by mouth daily.   Yes Historical Provider, MD  atorvastatin (LIPITOR) 40 MG tablet Take 1 tablet (40 mg total) by mouth daily. 09/10/14  Yes Antoine Poche, MD  benazepril (LOTENSIN) 40 MG tablet Take 40 mg by mouth every morning.   Yes Historical Provider, MD  dicyclomine (BENTYL) 20 MG tablet Take one every 6 hours as needed for abd cramps 02/12/15  Yes Bethann Berkshire, MD  docusate sodium 100 MG CAPS Take 100 mg by mouth 2 (two) times daily. 12/06/13  Yes Matthew Babish, PA-C   doxepin (SINEQUAN) 10 MG capsule Take 10 mg by mouth at bedtime.   Yes Historical Provider, MD  furosemide (LASIX) 20 MG tablet TAKE 1 TABLET BY MOUTH IN THE MORNING. 10/08/14  Yes Marinus Maw, MD  ketorolac (ACULAR) 0.5 % ophthalmic solution Place 1 drop into the right eye 3 (  three) times daily.   Yes Historical Provider, MD  levETIRAcetam (KEPPRA) 500 MG tablet Take 500 mg by mouth 2 (two) times daily.   Yes Historical Provider, MD  lubiprostone (AMITIZA) 24 MCG capsule Take 24 mcg by mouth 2 (two) times daily with a meal.   Yes Historical Provider, MD  metFORMIN (GLUCOPHAGE) 500 MG tablet Take 500 mg by mouth 2 (two) times daily with a meal.    Yes Historical Provider, MD  metoprolol tartrate (LOPRESSOR) 25 MG tablet Take 25 mg by mouth 2 (two) times daily.    Yes Historical Provider, MD  nitroGLYCERIN (NITROSTAT) 0.4 MG SL tablet Place 1 tablet (0.4 mg total) under the tongue every 5 (five) minutes as needed for chest pain. 05/20/14  Yes Antoine PocheJonathan F Branch, MD  omega-3 acid ethyl esters (LOVAZA) 1 G capsule Take 2 g by mouth 2 (two) times daily.    Yes Historical Provider, MD  ondansetron (ZOFRAN ODT) 4 MG disintegrating tablet 4mg  ODT q4 hours prn nausea/vomit 02/12/15  Yes Bethann BerkshireJoseph Zammit, MD  oxybutynin (DITROPAN-XL) 10 MG 24 hr tablet Take 10 mg by mouth every morning.    Yes Historical Provider, MD  pantoprazole (PROTONIX) 40 MG tablet Take 1 tablet (40 mg total) by mouth daily. 12/03/13  Yes Nira RetortAnna W Sams, NP  polyethylene glycol (MIRALAX / GLYCOLAX) packet Take 17 g by mouth 2 (two) times daily. Patient taking differently: Take 17 g by mouth daily as needed for mild constipation.  12/06/13  Yes Lanney GinsMatthew Babish, PA-C  potassium chloride (K-DUR,KLOR-CON) 10 MEQ tablet Take 10 mEq by mouth daily.   Yes Historical Provider, MD  traMADol (ULTRAM) 50 MG tablet Take 1-2 tablets (50-100 mg total) by mouth every 6 (six) hours as needed for moderate pain. 12/06/13  Yes Matthew Babish, PA-C   BP 128/72 mmHg   Pulse 79  Temp(Src) 98.9 F (37.2 C) (Oral)  Resp 18  Ht 5\' 2"  (1.575 m)  Wt 200 lb (90.719 kg)  BMI 36.57 kg/m2  SpO2 97% Physical Exam  Constitutional: She is oriented to person, place, and time. She appears well-developed and well-nourished.  Non-toxic appearance.  HENT:  Head: Normocephalic.  Right Ear: Tympanic membrane and external ear normal.  Left Ear: Tympanic membrane and external ear normal.  Eyes: EOM and lids are normal. Pupils are equal, round, and reactive to light.  Neck: Normal range of motion. Neck supple. Carotid bruit is not present.  Cardiovascular: Normal rate, regular rhythm, normal heart sounds, intact distal pulses and normal pulses.   Pulmonary/Chest: Breath sounds normal. No respiratory distress.  Abdominal: Soft. Bowel sounds are normal. There is no tenderness. There is no guarding.  Musculoskeletal:       Left foot: There is decreased range of motion, tenderness and bony tenderness. There is normal capillary refill and no deformity.  No color or temp changes.  Lymphadenopathy:       Head (right side): No submandibular adenopathy present.       Head (left side): No submandibular adenopathy present.    She has no cervical adenopathy.  Neurological: She is alert and oriented to person, place, and time. She has normal strength. No cranial nerve deficit or sensory deficit.  Skin: Skin is warm and dry.  Psychiatric: She has a normal mood and affect. Her speech is normal.  Nursing note and vitals reviewed.   ED Course  Procedures (including critical care time) Labs Review Labs Reviewed - No data to display  Imaging Review Dg Ankle Complete  Left  04/01/2015   CLINICAL DATA:  Left foot and ankle pain for 2 weeks.  EXAM: LEFT ANKLE COMPLETE - 3+ VIEW  COMPARISON:  None.  FINDINGS: Plafond and talar dome intact. No malleolar abnormality. Small Achilles and plantar calcaneal spurs. Subtalar joints appear intact.  IMPRESSION: 1. No acute bony findings. 2.  Small plantar and Achilles calcaneal spurs.   Electronically Signed   By: Gaylyn Rong M.D.   On: 04/01/2015 13:16   Dg Foot Complete Left  04/01/2015   CLINICAL DATA:  Left foot pain.  No known injury.  EXAM: LEFT FOOT - COMPLETE 3+ VIEW  COMPARISON:  12/24/2014  FINDINGS: There is no evidence of fracture or dislocation. Mild hallux valgus and bony bunion formation again noted. Generalized osteopenia again demonstrated.  IMPRESSION: No acute findings.  Mild hallux valgus and bony bunion.  Osteopenia.   Electronically Signed   By: Myles Rosenthal M.D.   On: 04/01/2015 13:18     EKG Interpretation None      MDM Vital signs are well within normal limits. X-ray of the left foot and ankle reveals various areas of degenerative changes, including calcaneal spurs. There is also noted osteopenia. There is no fracture or dislocation appreciated. Discuss the findings with the patient and discussed with her the arthritis/degenerative changes present. She will follow with her primary physician. Rx for norco given to the patient.    Final diagnoses:  None    *I have reviewed nursing notes, vital signs, and all appropriate lab and imaging results for this patient.**    Ivery Quale, PA-C 04/02/15 2113  Zadie Rhine, MD 04/03/15 [id number]

## 2015-04-07 DIAGNOSIS — M79672 Pain in left foot: Secondary | ICD-10-CM | POA: Diagnosis not present

## 2015-04-07 DIAGNOSIS — E1121 Type 2 diabetes mellitus with diabetic nephropathy: Secondary | ICD-10-CM | POA: Diagnosis not present

## 2015-04-07 DIAGNOSIS — I120 Hypertensive chronic kidney disease with stage 5 chronic kidney disease or end stage renal disease: Secondary | ICD-10-CM | POA: Diagnosis not present

## 2015-04-07 DIAGNOSIS — I2510 Atherosclerotic heart disease of native coronary artery without angina pectoris: Secondary | ICD-10-CM | POA: Diagnosis not present

## 2015-04-15 DIAGNOSIS — G5792 Unspecified mononeuropathy of left lower limb: Secondary | ICD-10-CM | POA: Diagnosis not present

## 2015-04-15 DIAGNOSIS — M961 Postlaminectomy syndrome, not elsewhere classified: Secondary | ICD-10-CM | POA: Diagnosis not present

## 2015-04-20 ENCOUNTER — Other Ambulatory Visit (HOSPITAL_COMMUNITY): Payer: Self-pay | Admitting: Pulmonary Disease

## 2015-04-20 DIAGNOSIS — M79672 Pain in left foot: Secondary | ICD-10-CM

## 2015-04-22 ENCOUNTER — Encounter (HOSPITAL_COMMUNITY): Payer: Commercial Managed Care - HMO

## 2015-04-22 ENCOUNTER — Encounter (HOSPITAL_COMMUNITY): Admission: RE | Admit: 2015-04-22 | Payer: Commercial Managed Care - HMO | Source: Ambulatory Visit

## 2015-05-27 DIAGNOSIS — M2042 Other hammer toe(s) (acquired), left foot: Secondary | ICD-10-CM | POA: Diagnosis not present

## 2015-05-27 DIAGNOSIS — E119 Type 2 diabetes mellitus without complications: Secondary | ICD-10-CM | POA: Diagnosis not present

## 2015-05-27 DIAGNOSIS — M79672 Pain in left foot: Secondary | ICD-10-CM | POA: Diagnosis not present

## 2015-05-27 DIAGNOSIS — M7752 Other enthesopathy of left foot: Secondary | ICD-10-CM | POA: Diagnosis not present

## 2015-06-01 IMAGING — CR DG HIP 1V PORT*L*
1 series · 1 of 1 positions shown · non-contrast
Comparison: None.

CLINICAL DATA: Hip pain.  Osteoarthritis.

EXAM:
PORTABLE PELVIS 1-2 VIEWS; PORTABLE LEFT HIP - 1 VIEW; DG C-ARM 1-60
MIN - NRPT [facility]

[AP]
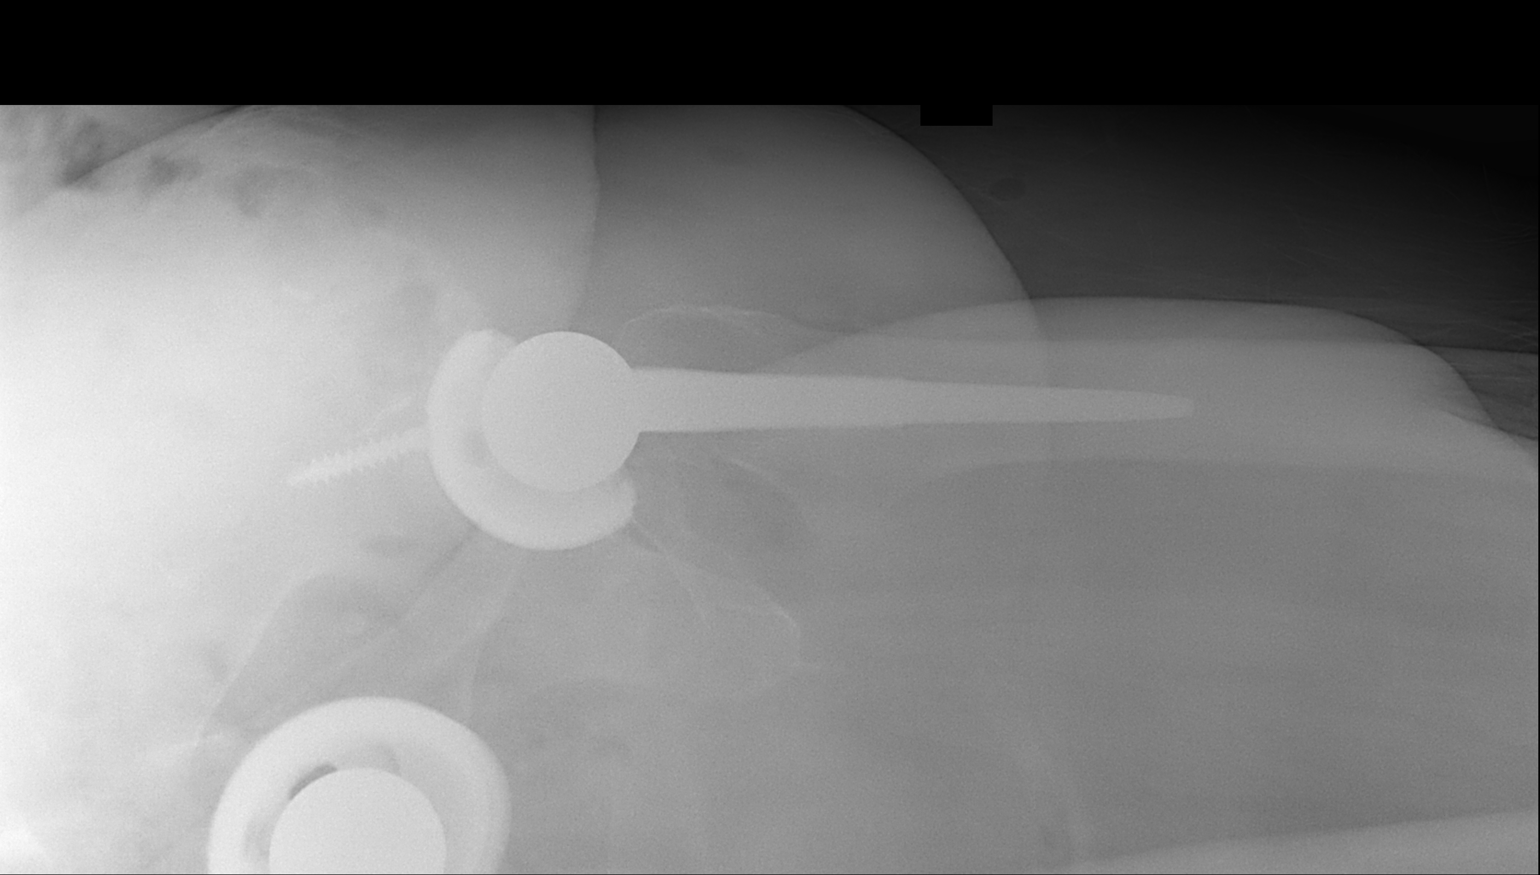

[1 of 1 positions shown; findings below may reference images not displayed]

FINDINGS: The patient has undergone total left hip arthroplasty today.
Satisfactory position and alignment. Previous right THA appears
uncomplicated.
IMPRESSION: Satisfactory postoperative appearance status post left THA.

## 2015-06-01 IMAGING — CR DG PORTABLE PELVIS
1 series · 1 of 1 positions shown · non-contrast
Comparison: None.

CLINICAL DATA: Hip pain.  Osteoarthritis.

EXAM:
PORTABLE PELVIS 1-2 VIEWS; PORTABLE LEFT HIP - 1 VIEW; DG C-ARM 1-60
MIN - NRPT [facility]

[AP]
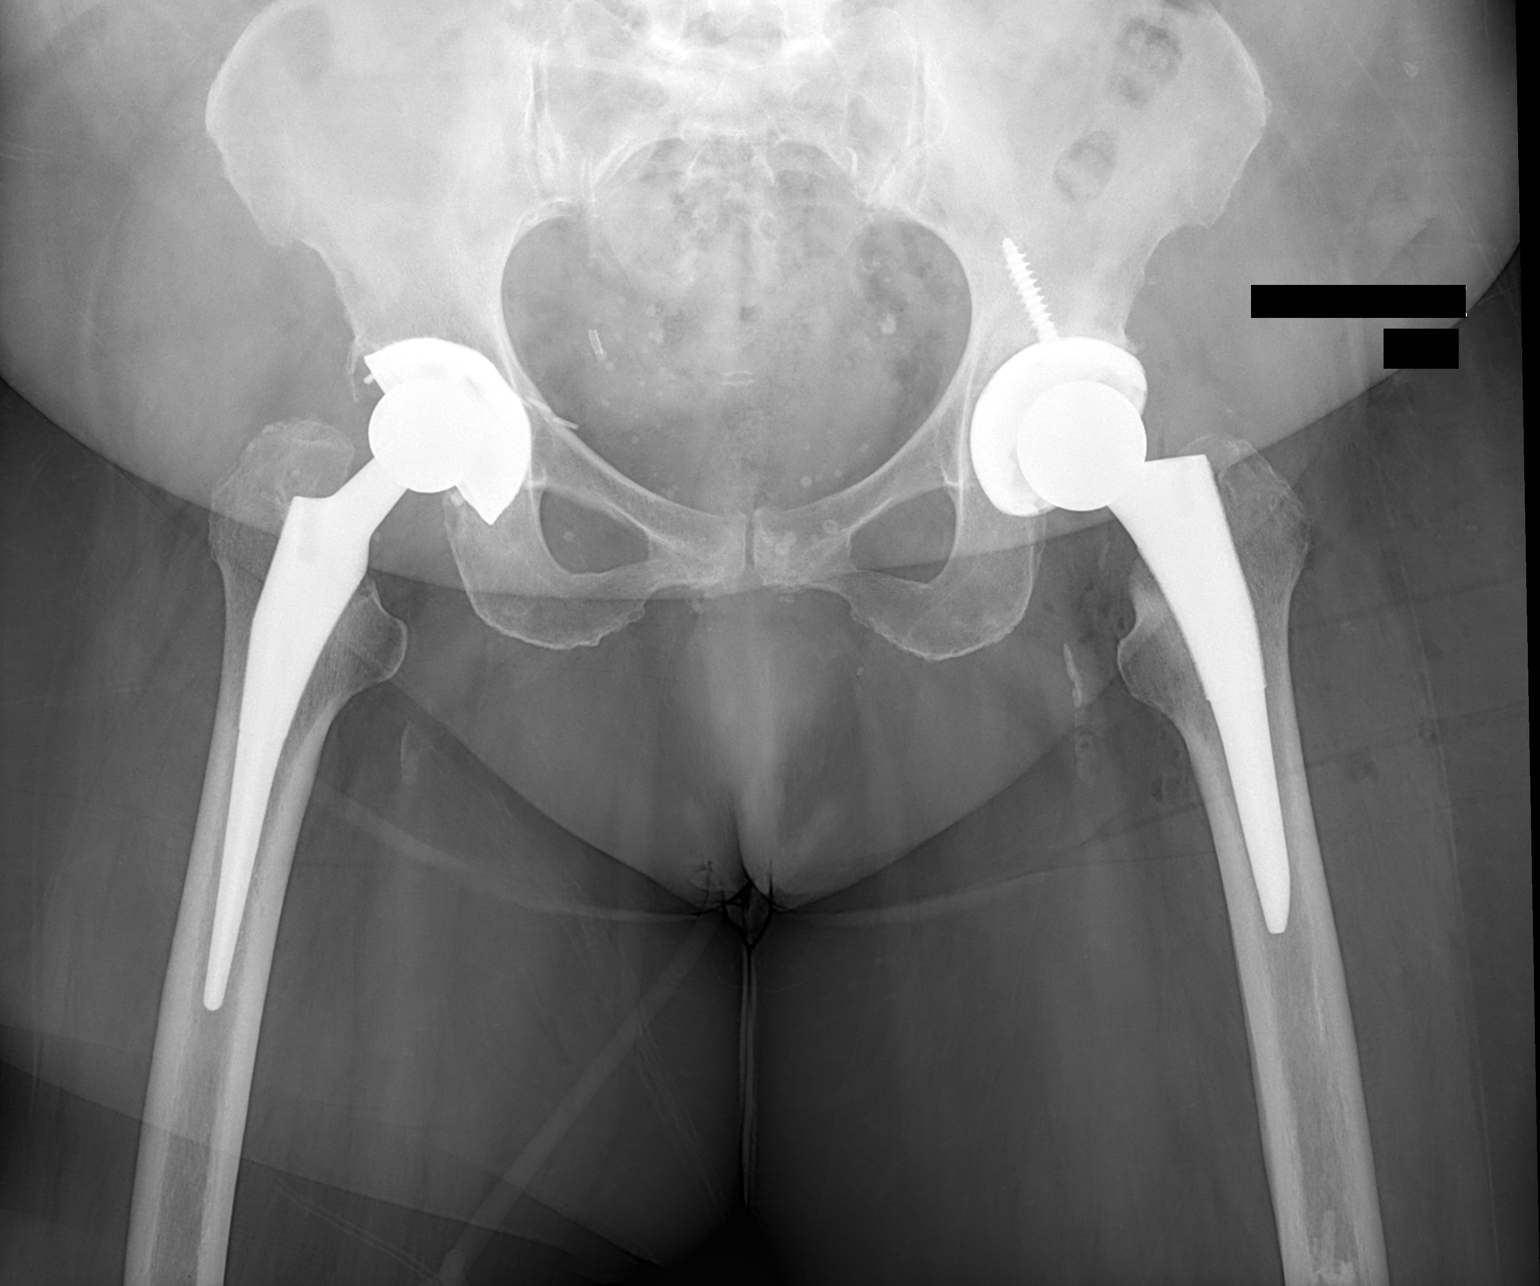

[1 of 1 positions shown; findings below may reference images not displayed]

FINDINGS: The patient has undergone total left hip arthroplasty today.
Satisfactory position and alignment. Previous right THA appears
uncomplicated.
IMPRESSION: Satisfactory postoperative appearance status post left THA.

## 2015-06-09 DIAGNOSIS — I129 Hypertensive chronic kidney disease with stage 1 through stage 4 chronic kidney disease, or unspecified chronic kidney disease: Secondary | ICD-10-CM | POA: Diagnosis not present

## 2015-06-09 DIAGNOSIS — I2510 Atherosclerotic heart disease of native coronary artery without angina pectoris: Secondary | ICD-10-CM | POA: Diagnosis not present

## 2015-06-09 DIAGNOSIS — I4891 Unspecified atrial fibrillation: Secondary | ICD-10-CM | POA: Diagnosis not present

## 2015-06-09 DIAGNOSIS — E1121 Type 2 diabetes mellitus with diabetic nephropathy: Secondary | ICD-10-CM | POA: Diagnosis not present

## 2015-06-24 DIAGNOSIS — M7752 Other enthesopathy of left foot: Secondary | ICD-10-CM | POA: Diagnosis not present

## 2015-07-13 ENCOUNTER — Ambulatory Visit: Payer: Commercial Managed Care - HMO | Admitting: Diagnostic Neuroimaging

## 2015-07-22 ENCOUNTER — Encounter: Payer: Self-pay | Admitting: Diagnostic Neuroimaging

## 2015-07-22 ENCOUNTER — Ambulatory Visit (INDEPENDENT_AMBULATORY_CARE_PROVIDER_SITE_OTHER): Payer: Commercial Managed Care - HMO | Admitting: Diagnostic Neuroimaging

## 2015-07-22 VITALS — BP 149/81 | HR 85 | Ht 62.0 in | Wt 183.0 lb

## 2015-07-22 DIAGNOSIS — M25475 Effusion, left foot: Secondary | ICD-10-CM | POA: Diagnosis not present

## 2015-07-22 DIAGNOSIS — E1142 Type 2 diabetes mellitus with diabetic polyneuropathy: Secondary | ICD-10-CM | POA: Diagnosis not present

## 2015-07-22 DIAGNOSIS — M5416 Radiculopathy, lumbar region: Secondary | ICD-10-CM

## 2015-07-22 DIAGNOSIS — M25579 Pain in unspecified ankle and joints of unspecified foot: Secondary | ICD-10-CM

## 2015-07-22 NOTE — Patient Instructions (Signed)
Your foot pain is due to several causes: - arthritis in foot - swelling in foot - back pain / pinched nerves - diabetic neuropathy  Follow up with Dr. Juanetta Gosling or pain management clinic or foot clinic for pain management (medications or trigger point injections).

## 2015-07-22 NOTE — Progress Notes (Signed)
[facility]  PATIENT: [name] DOB: [birth year]  REFERRING CLINICIAN: Juanetta Gosling, E  HISTORY FROM: patient and friend  REASON FOR VISIT: new consult    HISTORICAL  CHIEF COMPLAINT:  Chief Complaint  Patient presents with  . Pain    rm 7 New patient, friend - Perlie Gold    HISTORY OF PRESENT ILLNESS:   74 year old female with chronic pain syndrome, hyperlipidemia, coronary artery disease, diabetes, heart failure, chronic kidney disease, atrial fibrillation, here for evaluation of left foot pain for over a year. Patient reports sharp shooting stabbing pain in the bottom of her left foot. She has some pain in right foot. She also has numbness, burning and tingling in bilateral feet. She has chronic low back pain for many years. She has also various types of pain in her neck, shoulders, elbows, wrists, knees and other joints. Patient tells me she has seen multiple doctors for her left foot pain without a specific diagnosis. She has tried some type of injection therapy with the podiatrist which helped in the past, but she did not ask if this could be repeated. She had a x-rays of the left foot which showed some soft tissue swelling and arthritis changes in January 2016 as well as evidence of a small nondisplaced fracture involving the base of the proximal phalanx of the small toe on the left side from December 2015.  Patient has tried gabapentin in the past but apparently this caused confusion. She is also tried hydrocodone and tramadol the past but this caused confusion as well. Patient has pacemaker and cannot have MRI studies. Patient also having memory problems and confusion, and has a difficult time understanding what the doctors are telling her at these various appointments. She usually goes with her daughter but today brought a different friend due to her daughter being dizzy today.   REVIEW OF SYSTEMS: Full 14 system review of systems performed and notable only for  memory loss weakness dizziness not asleep change in appetite runny nose diarrhea shortness of breath eye pain blurred vision easy bruising.  ALLERGIES: Allergies  Allergen Reactions  . Codeine     "good" headache  . Other Nausea And Vomiting    Leafy raw vegetables and seeds  . Oxycodone Hcl     "good" headache; also OxyContin  . Peanuts [Peanut Oil] Nausea And Vomiting  . Reglan [Metoclopramide]     CONFUSION    HOME MEDICATIONS: Outpatient Prescriptions Prior to Visit  Medication Sig Dispense Refill  . acetaminophen (TYLENOL) 325 MG tablet Take 1-2 tablets (325-650 mg total) by mouth every 6 (six) hours as needed. (Patient taking differently: Take 325-650 mg by mouth every 6 (six) hours as needed for mild pain. )    . apixaban (ELIQUIS) 5 MG TABS tablet Take 5 mg by mouth 2 (two) times daily.     Marland Kitchen aspirin 81 MG tablet Take 81 mg by mouth daily.    Marland Kitchen atorvastatin (LIPITOR) 40 MG tablet Take 1 tablet (40 mg total) by mouth daily. 30 tablet 0  . benazepril (LOTENSIN) 40 MG tablet Take 40 mg by mouth every morning.    . dicyclomine (BENTYL) 20 MG tablet Take one every 6 hours as needed for abd cramps 20 tablet 0  . docusate sodium 100 MG CAPS Take 100 mg by mouth 2 (two) times daily. 10 capsule 0  . doxepin (SINEQUAN) 10 MG capsule Take 10 mg by mouth at bedtime.    . furosemide (LASIX) 20 MG tablet  TAKE 1 TABLET BY MOUTH IN THE MORNING. 30 tablet 3  . HYDROcodone-acetaminophen (NORCO/VICODIN) 5-325 MG per tablet Take 1 tablet by mouth every 4 (four) hours as needed. 15 tablet 0  . ketorolac (ACULAR) 0.5 % ophthalmic solution Place 1 drop into the right eye 3 (three) times daily.    Marland Kitchen levETIRAcetam (KEPPRA) 500 MG tablet Take 500 mg by mouth 2 (two) times daily.    Marland Kitchen lubiprostone (AMITIZA) 24 MCG capsule Take 24 mcg by mouth 2 (two) times daily with a meal.    . metFORMIN (GLUCOPHAGE) 500 MG tablet Take 500 mg by mouth 2 (two) times daily with a meal.     . metoprolol tartrate  (LOPRESSOR) 25 MG tablet Take 25 mg by mouth 2 (two) times daily.     . nitroGLYCERIN (NITROSTAT) 0.4 MG SL tablet Place 1 tablet (0.4 mg total) under the tongue every 5 (five) minutes as needed for chest pain. 25 tablet 1  . omega-3 acid ethyl esters (LOVAZA) 1 G capsule Take 2 g by mouth 2 (two) times daily.     . ondansetron (ZOFRAN ODT) 4 MG disintegrating tablet 4mg  ODT q4 hours prn nausea/vomit 12 tablet 0  . ondansetron (ZOFRAN) 4 MG tablet 1 po q6h prn nausea 6 tablet 0  . oxybutynin (DITROPAN-XL) 10 MG 24 hr tablet Take 10 mg by mouth every morning.     . pantoprazole (PROTONIX) 40 MG tablet Take 1 tablet (40 mg total) by mouth daily. 30 tablet 3  . polyethylene glycol (MIRALAX / GLYCOLAX) packet Take 17 g by mouth 2 (two) times daily. (Patient taking differently: Take 17 g by mouth daily as needed for mild constipation. ) 14 each 0  . potassium chloride (K-DUR,KLOR-CON) 10 MEQ tablet Take 10 mEq by mouth daily.    . traMADol (ULTRAM) 50 MG tablet Take 1-2 tablets (50-100 mg total) by mouth every 6 (six) hours as needed for moderate pain. 80 tablet 0   No facility-administered medications prior to visit.    PAST MEDICAL HISTORY: Past Medical History  Diagnosis Date  . Hypertension   . CAD (coronary artery disease)     multivessel, CABG 6/10.  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  Myoview (11/20/11) showed no ischemia & EF 62%  . Hyperlipidemia   . Deep vein thrombosis, upper right extremity     post-PPM  . Obesity   . Dehiscence of closure of sternum or sternotomy 07/2009    sternal infection; required flap closure  . COPD (chronic obstructive pulmonary disease)     severe lung disease by PFTs 6/12  . Seizure disorder   . 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 PUD (peptic ulcer disease)   . Pacemaker   . Sleep apnea     denies CPAP use on 09/30/2013  . Diabetes mellitus, type II   . H/O hiatal hernia   . Migraines     "years ago"  (09/30/2013)  . DDD (degenerative disc disease)   . Arthritis     "legs and back" (09/30/2013)  . Chronic lower back pain   . Urge incontinence of urine   . Myocardial infarction     years ago  . Anginal pain     "all the time, but not heart related"  . Asthma     years ago  . Orthopnea     sometimes  . 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  History of kidney stones   . Gastroesophageal reflux disease     not much  . Seizures     "long time ago; don't remember what they were related to" (09/30/2013)none recent  . Anemia   . Blurred vision, bilateral     sometimes double vision    PAST SURGICAL HISTORY: Past Surgical History  Procedure Laterality Date  . Pacemaker placement  1993    Status post DDD pacemaker implantation in 1993, with Medtronic Kappa generator change in 2003 for  second-degree AV block, most recent gen change by Fawn Kirk 04/14/11  . Total hip arthroplasty Right   . Replacement total knee Bilateral   . Thyroidectomy    . Wrist surgery Left     "tumor taken off" (09/30/2013)  . Coronary artery bypass graft  05/1999    - LIMA to LAD, SVG to OM, SVG to PDA  . Reconstructive repair sternal      for infection s/p CABG 8/10  . Esophagogastroduodenoscopy      with [facility] dilation,  biopsy, and disruption Schatzki's ring  . Colonoscopy  04/27/2005    hemorrhoids, diverticula  . Esophagogastroduodenoscopy  09/10/2012    RMR: Noncritical Schatzki's ring. Diffuse gastric erosions and an  area of partially healing ulceration-status post biopsy/ Small hiatal hernia. Bx reactive gastropathy.  . Colonoscopy with esophagogastroduodenoscopy (egd) N/A 02/20/2013    RMR: pancolonic diverticulosis and internal hemorrhoids. EGD with small hiatal hernia, healed gastric ulcer  . Portacath placement Left 07/17/2013    Procedure: INSERTION PORT-A-CATH-left subclavian;  Surgeon: Fabio Bering, MD;  Location: [facility];  Service: General;  Laterality: Left;  . Appendectomy    .  Cholecystectomy    . Insert / replace / remove pacemaker  1993  . Lumbar disc surgery    . Posterior lumbar fusion    . Tubal ligation    . Cesarean section  ~1980  . Abdominal hysterectomy      partial  . Back surgery      x3 total  . Total hip arthroplasty Left 12/03/2013    Procedure: LEFT TOTAL HIP ARTHROPLASTY ANTERIOR APPROACH;  Surgeon: Shelda Pal, MD;  Location: [facility];  Service: Orthopedics;  Laterality: Left;  . Joint replacement      FAMILY HISTORY: Family History  Problem Relation Age of Onset  . Diabetes    . Heart disease Mother     MI  . Coronary artery disease      Female <55  . Cancer    . Colon cancer Father     age 70    SOCIAL HISTORY:  Social History   Social History  . Marital Status: Married    Spouse Name: N/A  . Number of Children: 5  . Years of Education: N/A   Occupational History  .     Social History Main Topics  . Smoking status: Never Smoker   . Smokeless tobacco: Former Neurosurgeon    Types: Snuff     Comment: 09/30/2013 "stopped snuff a long time ago"  . Alcohol Use: No  . Drug Use: No  . Sexual Activity: No   Other Topics Concern  . Not on file   Social History Narrative   Lives at home with daughter, son-in-law   Caffeine use-  none     PHYSICAL EXAM  GENERAL EXAM/CONSTITUTIONAL: Vitals:  Filed Vitals:   07/22/15 1323  BP: 149/81  Pulse: 85  Height:  (1.575 m)  Weight: 183 lb (83.008 kg)  Body mass index is 33.46 kg/(m^2).  Visual Acuity Screening   Right eye Left eye Both eyes  Without correction:     With correction: 20/50 20/50      Patient is in no distress; well developed, nourished and groomed; neck is supple  CARDIOVASCULAR:  Examination of carotid arteries is normal; no carotid bruits  Regular rate and rhythm, no murmurs  Examination of peripheral vascular system by observation and palpation is normal  EYES:  Ophthalmoscopic exam of optic discs and posterior segments is normal; no  papilledema or hemorrhages  MUSCULOSKELETAL:  Gait, strength, tone, movements noted in Neurologic exam below  NEUROLOGIC: MENTAL STATUS:  No flowsheet data found.  awake, alert, oriented to person, place and time  DECR MEMORY  normal attention and concentration  language fluent, comprehension intact, naming intact,   fund of knowledge appropriate  CRANIAL NERVE:   2nd - no papilledema on fundoscopic exam  2nd, 3rd, 4th, 6th - pupils equal and reactive to light, visual fields full to confrontation, extraocular muscles intact, no nystagmus  5th - facial sensation symmetric  7th - facial strength symmetric  8th - hearing intact  9th - palate elevates symmetrically, uvula midline  11th - shoulder shrug symmetric  12th - tongue protrusion midline  MOTOR:   normal bulk and tone; BUE 4; BLE 5 PROX AND 4 DISTAL  SENSORY:   normal and symmetric to light touch, pinprick, temperature, vibration; EXCEPT DECR VIB AND PP IN LEFT FOOT  COORDINATION:   finger-nose-finger, fine finger movements normal  REFLEXES:   deep tendon reflexes TRACE and symmetric; ABSENT AT ANKLES  GAIT/STATION:   ANTALGIC UNSTEADY GAIT; LIMPS ON LEFT FOOT; STOOPED; USES 4 POINT CANE    DIAGNOSTIC DATA (LABS, IMAGING, TESTING) - I reviewed patient records, labs, notes, testing and imaging myself where available.  Lab Results  Component Value Date   WBC 8.9 02/12/2015   HGB 10.6* 02/12/2015   HCT 32.6* 02/12/2015   MCV 79.3 02/12/2015   PLT 324 02/12/2015      Component Value Date/Time   NA 139 02/12/2015 0730   K 3.5 02/12/2015 0730   CL 106 02/12/2015 0730   CO2 21 02/12/2015 0730   GLUCOSE 151* 02/12/2015 0730   BUN 15 02/12/2015 0730   CREATININE 1.29* 02/12/2015 0730   CREATININE 2.29* 01/11/2013 1001   CALCIUM 10.3 02/12/2015 0730   PROT 8.6* 02/12/2015 0730   ALBUMIN 4.1 02/12/2015 0730   AST 26 02/12/2015 0730   ALT 15 02/12/2015 0730   ALKPHOS 72 02/12/2015 0730    BILITOT 0.8 02/12/2015 0730   GFRNONAA 39* 02/12/2015 0730   GFRAA 46* 02/12/2015 0730   Lab Results  Component Value Date   CHOL 123 02/19/2014   HDL 41 02/19/2014   LDLCALC 69 02/19/2014   TRIG 67 02/19/2014   CHOLHDL 3.0 02/19/2014   Lab Results  Component Value Date   HGBA1C 6.2* 10/09/2014   Lab Results  Component Value Date   VITAMINB12 590 07/01/2013   Lab Results  Component Value Date   TSH 0.613 07/22/2013    04/01/15 xray left ankle 1. No acute bony findings. 2. Small plantar and Achilles calcaneal spurs.  04/01/15 xray left foot - No acute findings. Mild hallux valgus and bony bunion. Osteopenia.  12/24/14 xray left foot  1. Forefoot soft tissue swelling without acute osseous abnormality. 2. Hallux valgus and mild first metatarsophalangeal joint osteoarthritis.  11/04/14 xray left foot  - Nondisplaced fracture involving the base  of the proximal phalanx of the small toe as described.     ASSESSMENT AND PLAN  74 y.o. year old female here with chronic low back pain and left > right foot pain since 1 year or longer. Symptoms are likely due to combination of arthritis changes, bone spurs, lumbar radiculopathy, diabetic neuropathy and soft tissue swelling/edema.  Dx: multi-factorial foot pain due to:  Pain in joint, ankle and foot, unspecified laterality  Diabetic polyneuropathy associated with type 2 diabetes mellitus  Lumbar radiculopathy  Swelling of foot joint, left    PLAN: - challenging case due to multiple co-morbid causes of foot and leg pain. Advised to continue conservative pain mgmt. Consider gabapentin, lyrica, cymbalta, steroid injections. Consider follow up with podiatry or pain mgmt clinic.  Return if symptoms worsen or fail to improve, for return to PCP.    Suanne Marker, MD 07/22/2015, 2:14 PM Certified in Neurology, Neurophysiology and Neuroimaging  [facility] [address] [place], [place]  [address] [phone]

## 2015-07-31 ENCOUNTER — Other Ambulatory Visit (HOSPITAL_COMMUNITY): Payer: Self-pay | Admitting: Pulmonary Disease

## 2015-07-31 ENCOUNTER — Ambulatory Visit (HOSPITAL_COMMUNITY)
Admission: RE | Admit: 2015-07-31 | Discharge: 2015-07-31 | Disposition: A | Discharge status: Home / Self Care | Payer: Commercial Managed Care - HMO | Source: Ambulatory Visit | Attending: Pulmonary Disease | Admitting: Pulmonary Disease

## 2015-07-31 DIAGNOSIS — G894 Chronic pain syndrome: Secondary | ICD-10-CM | POA: Diagnosis not present

## 2015-07-31 DIAGNOSIS — Z96643 Presence of artificial hip joint, bilateral: Secondary | ICD-10-CM | POA: Insufficient documentation

## 2015-07-31 DIAGNOSIS — I10 Essential (primary) hypertension: Secondary | ICD-10-CM | POA: Diagnosis not present

## 2015-07-31 DIAGNOSIS — M5442 Lumbago with sciatica, left side: Secondary | ICD-10-CM

## 2015-07-31 DIAGNOSIS — M47896 Other spondylosis, lumbar region: Secondary | ICD-10-CM | POA: Diagnosis not present

## 2015-07-31 DIAGNOSIS — M545 Low back pain: Secondary | ICD-10-CM | POA: Insufficient documentation

## 2015-07-31 DIAGNOSIS — I110 Hypertensive heart disease with heart failure: Secondary | ICD-10-CM | POA: Diagnosis not present

## 2015-07-31 DIAGNOSIS — M47816 Spondylosis without myelopathy or radiculopathy, lumbar region: Secondary | ICD-10-CM | POA: Diagnosis not present

## 2015-07-31 DIAGNOSIS — E118 Type 2 diabetes mellitus with unspecified complications: Secondary | ICD-10-CM | POA: Diagnosis not present

## 2015-08-28 DIAGNOSIS — Z23 Encounter for immunization: Secondary | ICD-10-CM | POA: Diagnosis not present

## 2015-08-28 DIAGNOSIS — M545 Low back pain: Secondary | ICD-10-CM | POA: Diagnosis not present

## 2015-08-28 DIAGNOSIS — I25119 Atherosclerotic heart disease of native coronary artery with unspecified angina pectoris: Secondary | ICD-10-CM | POA: Diagnosis not present

## 2015-08-28 DIAGNOSIS — E1121 Type 2 diabetes mellitus with diabetic nephropathy: Secondary | ICD-10-CM | POA: Diagnosis not present

## 2015-08-28 DIAGNOSIS — M159 Polyosteoarthritis, unspecified: Secondary | ICD-10-CM | POA: Diagnosis not present

## 2015-09-02 DIAGNOSIS — M545 Low back pain: Secondary | ICD-10-CM | POA: Diagnosis not present

## 2015-09-02 DIAGNOSIS — J449 Chronic obstructive pulmonary disease, unspecified: Secondary | ICD-10-CM | POA: Diagnosis not present

## 2015-09-02 DIAGNOSIS — I2510 Atherosclerotic heart disease of native coronary artery without angina pectoris: Secondary | ICD-10-CM | POA: Diagnosis not present

## 2015-09-02 DIAGNOSIS — E119 Type 2 diabetes mellitus without complications: Secondary | ICD-10-CM | POA: Diagnosis not present

## 2015-09-02 DIAGNOSIS — I10 Essential (primary) hypertension: Secondary | ICD-10-CM | POA: Diagnosis not present

## 2015-09-02 DIAGNOSIS — R296 Repeated falls: Secondary | ICD-10-CM | POA: Diagnosis not present

## 2015-09-02 DIAGNOSIS — Z9181 History of falling: Secondary | ICD-10-CM | POA: Diagnosis not present

## 2015-09-02 DIAGNOSIS — I252 Old myocardial infarction: Secondary | ICD-10-CM | POA: Diagnosis not present

## 2015-09-02 DIAGNOSIS — I5032 Chronic diastolic (congestive) heart failure: Secondary | ICD-10-CM | POA: Diagnosis not present

## 2015-09-04 DIAGNOSIS — I5032 Chronic diastolic (congestive) heart failure: Secondary | ICD-10-CM | POA: Diagnosis not present

## 2015-09-04 DIAGNOSIS — I10 Essential (primary) hypertension: Secondary | ICD-10-CM | POA: Diagnosis not present

## 2015-09-04 DIAGNOSIS — M545 Low back pain: Secondary | ICD-10-CM | POA: Diagnosis not present

## 2015-09-04 DIAGNOSIS — I2510 Atherosclerotic heart disease of native coronary artery without angina pectoris: Secondary | ICD-10-CM | POA: Diagnosis not present

## 2015-09-04 DIAGNOSIS — Z9181 History of falling: Secondary | ICD-10-CM | POA: Diagnosis not present

## 2015-09-04 DIAGNOSIS — E119 Type 2 diabetes mellitus without complications: Secondary | ICD-10-CM | POA: Diagnosis not present

## 2015-09-04 DIAGNOSIS — R296 Repeated falls: Secondary | ICD-10-CM | POA: Diagnosis not present

## 2015-09-04 DIAGNOSIS — J449 Chronic obstructive pulmonary disease, unspecified: Secondary | ICD-10-CM | POA: Diagnosis not present

## 2015-09-04 DIAGNOSIS — I252 Old myocardial infarction: Secondary | ICD-10-CM | POA: Diagnosis not present

## 2015-09-07 DIAGNOSIS — R296 Repeated falls: Secondary | ICD-10-CM | POA: Diagnosis not present

## 2015-09-07 DIAGNOSIS — E119 Type 2 diabetes mellitus without complications: Secondary | ICD-10-CM | POA: Diagnosis not present

## 2015-09-07 DIAGNOSIS — I2510 Atherosclerotic heart disease of native coronary artery without angina pectoris: Secondary | ICD-10-CM | POA: Diagnosis not present

## 2015-09-07 DIAGNOSIS — I10 Essential (primary) hypertension: Secondary | ICD-10-CM | POA: Diagnosis not present

## 2015-09-07 DIAGNOSIS — I252 Old myocardial infarction: Secondary | ICD-10-CM | POA: Diagnosis not present

## 2015-09-07 DIAGNOSIS — M545 Low back pain: Secondary | ICD-10-CM | POA: Diagnosis not present

## 2015-09-07 DIAGNOSIS — I5032 Chronic diastolic (congestive) heart failure: Secondary | ICD-10-CM | POA: Diagnosis not present

## 2015-09-07 DIAGNOSIS — Z9181 History of falling: Secondary | ICD-10-CM | POA: Diagnosis not present

## 2015-09-07 DIAGNOSIS — J449 Chronic obstructive pulmonary disease, unspecified: Secondary | ICD-10-CM | POA: Diagnosis not present

## 2015-09-08 DIAGNOSIS — I5032 Chronic diastolic (congestive) heart failure: Secondary | ICD-10-CM | POA: Diagnosis not present

## 2015-09-08 DIAGNOSIS — Z9181 History of falling: Secondary | ICD-10-CM | POA: Diagnosis not present

## 2015-09-08 DIAGNOSIS — J449 Chronic obstructive pulmonary disease, unspecified: Secondary | ICD-10-CM | POA: Diagnosis not present

## 2015-09-08 DIAGNOSIS — E119 Type 2 diabetes mellitus without complications: Secondary | ICD-10-CM | POA: Diagnosis not present

## 2015-09-08 DIAGNOSIS — I10 Essential (primary) hypertension: Secondary | ICD-10-CM | POA: Diagnosis not present

## 2015-09-08 DIAGNOSIS — R296 Repeated falls: Secondary | ICD-10-CM | POA: Diagnosis not present

## 2015-09-08 DIAGNOSIS — M545 Low back pain: Secondary | ICD-10-CM | POA: Diagnosis not present

## 2015-09-08 DIAGNOSIS — I252 Old myocardial infarction: Secondary | ICD-10-CM | POA: Diagnosis not present

## 2015-09-08 DIAGNOSIS — I2510 Atherosclerotic heart disease of native coronary artery without angina pectoris: Secondary | ICD-10-CM | POA: Diagnosis not present

## 2015-09-10 DIAGNOSIS — M545 Low back pain: Secondary | ICD-10-CM | POA: Diagnosis not present

## 2015-09-10 DIAGNOSIS — I2510 Atherosclerotic heart disease of native coronary artery without angina pectoris: Secondary | ICD-10-CM | POA: Diagnosis not present

## 2015-09-10 DIAGNOSIS — R296 Repeated falls: Secondary | ICD-10-CM | POA: Diagnosis not present

## 2015-09-10 DIAGNOSIS — E119 Type 2 diabetes mellitus without complications: Secondary | ICD-10-CM | POA: Diagnosis not present

## 2015-09-10 DIAGNOSIS — I5032 Chronic diastolic (congestive) heart failure: Secondary | ICD-10-CM | POA: Diagnosis not present

## 2015-09-10 DIAGNOSIS — J449 Chronic obstructive pulmonary disease, unspecified: Secondary | ICD-10-CM | POA: Diagnosis not present

## 2015-09-10 DIAGNOSIS — Z9181 History of falling: Secondary | ICD-10-CM | POA: Diagnosis not present

## 2015-09-10 DIAGNOSIS — I252 Old myocardial infarction: Secondary | ICD-10-CM | POA: Diagnosis not present

## 2015-09-10 DIAGNOSIS — I10 Essential (primary) hypertension: Secondary | ICD-10-CM | POA: Diagnosis not present

## 2015-09-15 DIAGNOSIS — I2510 Atherosclerotic heart disease of native coronary artery without angina pectoris: Secondary | ICD-10-CM | POA: Diagnosis not present

## 2015-09-15 DIAGNOSIS — I5032 Chronic diastolic (congestive) heart failure: Secondary | ICD-10-CM | POA: Diagnosis not present

## 2015-09-15 DIAGNOSIS — Z9181 History of falling: Secondary | ICD-10-CM | POA: Diagnosis not present

## 2015-09-15 DIAGNOSIS — M545 Low back pain: Secondary | ICD-10-CM | POA: Diagnosis not present

## 2015-09-15 DIAGNOSIS — I252 Old myocardial infarction: Secondary | ICD-10-CM | POA: Diagnosis not present

## 2015-09-15 DIAGNOSIS — R296 Repeated falls: Secondary | ICD-10-CM | POA: Diagnosis not present

## 2015-09-15 DIAGNOSIS — I10 Essential (primary) hypertension: Secondary | ICD-10-CM | POA: Diagnosis not present

## 2015-09-15 DIAGNOSIS — E119 Type 2 diabetes mellitus without complications: Secondary | ICD-10-CM | POA: Diagnosis not present

## 2015-09-15 DIAGNOSIS — J449 Chronic obstructive pulmonary disease, unspecified: Secondary | ICD-10-CM | POA: Diagnosis not present

## 2015-09-16 DIAGNOSIS — R296 Repeated falls: Secondary | ICD-10-CM | POA: Diagnosis not present

## 2015-09-16 DIAGNOSIS — Z9181 History of falling: Secondary | ICD-10-CM | POA: Diagnosis not present

## 2015-09-16 DIAGNOSIS — J449 Chronic obstructive pulmonary disease, unspecified: Secondary | ICD-10-CM | POA: Diagnosis not present

## 2015-09-16 DIAGNOSIS — M545 Low back pain: Secondary | ICD-10-CM | POA: Diagnosis not present

## 2015-09-16 DIAGNOSIS — I5032 Chronic diastolic (congestive) heart failure: Secondary | ICD-10-CM | POA: Diagnosis not present

## 2015-09-16 DIAGNOSIS — I2510 Atherosclerotic heart disease of native coronary artery without angina pectoris: Secondary | ICD-10-CM | POA: Diagnosis not present

## 2015-09-16 DIAGNOSIS — I252 Old myocardial infarction: Secondary | ICD-10-CM | POA: Diagnosis not present

## 2015-09-16 DIAGNOSIS — I10 Essential (primary) hypertension: Secondary | ICD-10-CM | POA: Diagnosis not present

## 2015-09-16 DIAGNOSIS — E119 Type 2 diabetes mellitus without complications: Secondary | ICD-10-CM | POA: Diagnosis not present

## 2015-09-17 DIAGNOSIS — I5032 Chronic diastolic (congestive) heart failure: Secondary | ICD-10-CM | POA: Diagnosis not present

## 2015-09-17 DIAGNOSIS — J449 Chronic obstructive pulmonary disease, unspecified: Secondary | ICD-10-CM | POA: Diagnosis not present

## 2015-09-17 DIAGNOSIS — E119 Type 2 diabetes mellitus without complications: Secondary | ICD-10-CM | POA: Diagnosis not present

## 2015-09-17 DIAGNOSIS — Z9181 History of falling: Secondary | ICD-10-CM | POA: Diagnosis not present

## 2015-09-17 DIAGNOSIS — I2510 Atherosclerotic heart disease of native coronary artery without angina pectoris: Secondary | ICD-10-CM | POA: Diagnosis not present

## 2015-09-17 DIAGNOSIS — M545 Low back pain: Secondary | ICD-10-CM | POA: Diagnosis not present

## 2015-09-17 DIAGNOSIS — R296 Repeated falls: Secondary | ICD-10-CM | POA: Diagnosis not present

## 2015-09-17 DIAGNOSIS — I10 Essential (primary) hypertension: Secondary | ICD-10-CM | POA: Diagnosis not present

## 2015-09-17 DIAGNOSIS — I252 Old myocardial infarction: Secondary | ICD-10-CM | POA: Diagnosis not present

## 2015-09-18 DIAGNOSIS — J449 Chronic obstructive pulmonary disease, unspecified: Secondary | ICD-10-CM | POA: Diagnosis not present

## 2015-09-18 DIAGNOSIS — I2510 Atherosclerotic heart disease of native coronary artery without angina pectoris: Secondary | ICD-10-CM | POA: Diagnosis not present

## 2015-09-18 DIAGNOSIS — Z9181 History of falling: Secondary | ICD-10-CM | POA: Diagnosis not present

## 2015-09-18 DIAGNOSIS — R296 Repeated falls: Secondary | ICD-10-CM | POA: Diagnosis not present

## 2015-09-18 DIAGNOSIS — I10 Essential (primary) hypertension: Secondary | ICD-10-CM | POA: Diagnosis not present

## 2015-09-18 DIAGNOSIS — I252 Old myocardial infarction: Secondary | ICD-10-CM | POA: Diagnosis not present

## 2015-09-18 DIAGNOSIS — I5032 Chronic diastolic (congestive) heart failure: Secondary | ICD-10-CM | POA: Diagnosis not present

## 2015-09-18 DIAGNOSIS — M545 Low back pain: Secondary | ICD-10-CM | POA: Diagnosis not present

## 2015-09-18 DIAGNOSIS — E119 Type 2 diabetes mellitus without complications: Secondary | ICD-10-CM | POA: Diagnosis not present

## 2015-09-21 DIAGNOSIS — I252 Old myocardial infarction: Secondary | ICD-10-CM | POA: Diagnosis not present

## 2015-09-21 DIAGNOSIS — I5032 Chronic diastolic (congestive) heart failure: Secondary | ICD-10-CM | POA: Diagnosis not present

## 2015-09-21 DIAGNOSIS — I2510 Atherosclerotic heart disease of native coronary artery without angina pectoris: Secondary | ICD-10-CM | POA: Diagnosis not present

## 2015-09-21 DIAGNOSIS — R296 Repeated falls: Secondary | ICD-10-CM | POA: Diagnosis not present

## 2015-09-21 DIAGNOSIS — J449 Chronic obstructive pulmonary disease, unspecified: Secondary | ICD-10-CM | POA: Diagnosis not present

## 2015-09-21 DIAGNOSIS — I10 Essential (primary) hypertension: Secondary | ICD-10-CM | POA: Diagnosis not present

## 2015-09-21 DIAGNOSIS — Z9181 History of falling: Secondary | ICD-10-CM | POA: Diagnosis not present

## 2015-09-21 DIAGNOSIS — M545 Low back pain: Secondary | ICD-10-CM | POA: Diagnosis not present

## 2015-09-21 DIAGNOSIS — E119 Type 2 diabetes mellitus without complications: Secondary | ICD-10-CM | POA: Diagnosis not present

## 2015-09-23 DIAGNOSIS — M545 Low back pain: Secondary | ICD-10-CM | POA: Diagnosis not present

## 2015-09-23 DIAGNOSIS — I10 Essential (primary) hypertension: Secondary | ICD-10-CM | POA: Diagnosis not present

## 2015-09-23 DIAGNOSIS — Z9181 History of falling: Secondary | ICD-10-CM | POA: Diagnosis not present

## 2015-09-23 DIAGNOSIS — E119 Type 2 diabetes mellitus without complications: Secondary | ICD-10-CM | POA: Diagnosis not present

## 2015-09-23 DIAGNOSIS — J449 Chronic obstructive pulmonary disease, unspecified: Secondary | ICD-10-CM | POA: Diagnosis not present

## 2015-09-23 DIAGNOSIS — R296 Repeated falls: Secondary | ICD-10-CM | POA: Diagnosis not present

## 2015-09-23 DIAGNOSIS — I5032 Chronic diastolic (congestive) heart failure: Secondary | ICD-10-CM | POA: Diagnosis not present

## 2015-09-23 DIAGNOSIS — I252 Old myocardial infarction: Secondary | ICD-10-CM | POA: Diagnosis not present

## 2015-09-23 DIAGNOSIS — I2510 Atherosclerotic heart disease of native coronary artery without angina pectoris: Secondary | ICD-10-CM | POA: Diagnosis not present

## 2015-09-24 DIAGNOSIS — Z9181 History of falling: Secondary | ICD-10-CM | POA: Diagnosis not present

## 2015-09-24 DIAGNOSIS — E119 Type 2 diabetes mellitus without complications: Secondary | ICD-10-CM | POA: Diagnosis not present

## 2015-09-24 DIAGNOSIS — J449 Chronic obstructive pulmonary disease, unspecified: Secondary | ICD-10-CM | POA: Diagnosis not present

## 2015-09-24 DIAGNOSIS — R296 Repeated falls: Secondary | ICD-10-CM | POA: Diagnosis not present

## 2015-09-24 DIAGNOSIS — I5032 Chronic diastolic (congestive) heart failure: Secondary | ICD-10-CM | POA: Diagnosis not present

## 2015-09-24 DIAGNOSIS — M545 Low back pain: Secondary | ICD-10-CM | POA: Diagnosis not present

## 2015-09-24 DIAGNOSIS — I252 Old myocardial infarction: Secondary | ICD-10-CM | POA: Diagnosis not present

## 2015-09-24 DIAGNOSIS — I2510 Atherosclerotic heart disease of native coronary artery without angina pectoris: Secondary | ICD-10-CM | POA: Diagnosis not present

## 2015-09-24 DIAGNOSIS — I10 Essential (primary) hypertension: Secondary | ICD-10-CM | POA: Diagnosis not present

## 2015-09-25 DIAGNOSIS — I5032 Chronic diastolic (congestive) heart failure: Secondary | ICD-10-CM | POA: Diagnosis not present

## 2015-09-25 DIAGNOSIS — Z9181 History of falling: Secondary | ICD-10-CM | POA: Diagnosis not present

## 2015-09-25 DIAGNOSIS — J449 Chronic obstructive pulmonary disease, unspecified: Secondary | ICD-10-CM | POA: Diagnosis not present

## 2015-09-25 DIAGNOSIS — I2510 Atherosclerotic heart disease of native coronary artery without angina pectoris: Secondary | ICD-10-CM | POA: Diagnosis not present

## 2015-09-25 DIAGNOSIS — M545 Low back pain: Secondary | ICD-10-CM | POA: Diagnosis not present

## 2015-09-25 DIAGNOSIS — I10 Essential (primary) hypertension: Secondary | ICD-10-CM | POA: Diagnosis not present

## 2015-09-25 DIAGNOSIS — R296 Repeated falls: Secondary | ICD-10-CM | POA: Diagnosis not present

## 2015-09-25 DIAGNOSIS — I252 Old myocardial infarction: Secondary | ICD-10-CM | POA: Diagnosis not present

## 2015-09-25 DIAGNOSIS — E119 Type 2 diabetes mellitus without complications: Secondary | ICD-10-CM | POA: Diagnosis not present

## 2015-09-28 DIAGNOSIS — I10 Essential (primary) hypertension: Secondary | ICD-10-CM | POA: Diagnosis not present

## 2015-09-28 DIAGNOSIS — E119 Type 2 diabetes mellitus without complications: Secondary | ICD-10-CM | POA: Diagnosis not present

## 2015-09-28 DIAGNOSIS — R296 Repeated falls: Secondary | ICD-10-CM | POA: Diagnosis not present

## 2015-09-28 DIAGNOSIS — I5032 Chronic diastolic (congestive) heart failure: Secondary | ICD-10-CM | POA: Diagnosis not present

## 2015-09-28 DIAGNOSIS — Z9181 History of falling: Secondary | ICD-10-CM | POA: Diagnosis not present

## 2015-09-28 DIAGNOSIS — I252 Old myocardial infarction: Secondary | ICD-10-CM | POA: Diagnosis not present

## 2015-09-28 DIAGNOSIS — M545 Low back pain: Secondary | ICD-10-CM | POA: Diagnosis not present

## 2015-09-28 DIAGNOSIS — J449 Chronic obstructive pulmonary disease, unspecified: Secondary | ICD-10-CM | POA: Diagnosis not present

## 2015-09-28 DIAGNOSIS — I2510 Atherosclerotic heart disease of native coronary artery without angina pectoris: Secondary | ICD-10-CM | POA: Diagnosis not present

## 2015-09-29 DIAGNOSIS — M545 Low back pain: Secondary | ICD-10-CM | POA: Diagnosis not present

## 2015-09-29 DIAGNOSIS — E119 Type 2 diabetes mellitus without complications: Secondary | ICD-10-CM | POA: Diagnosis not present

## 2015-09-29 DIAGNOSIS — J449 Chronic obstructive pulmonary disease, unspecified: Secondary | ICD-10-CM | POA: Diagnosis not present

## 2015-09-29 DIAGNOSIS — I252 Old myocardial infarction: Secondary | ICD-10-CM | POA: Diagnosis not present

## 2015-09-29 DIAGNOSIS — I5032 Chronic diastolic (congestive) heart failure: Secondary | ICD-10-CM | POA: Diagnosis not present

## 2015-09-29 DIAGNOSIS — I10 Essential (primary) hypertension: Secondary | ICD-10-CM | POA: Diagnosis not present

## 2015-09-29 DIAGNOSIS — R296 Repeated falls: Secondary | ICD-10-CM | POA: Diagnosis not present

## 2015-09-29 DIAGNOSIS — Z9181 History of falling: Secondary | ICD-10-CM | POA: Diagnosis not present

## 2015-09-29 DIAGNOSIS — I2510 Atherosclerotic heart disease of native coronary artery without angina pectoris: Secondary | ICD-10-CM | POA: Diagnosis not present

## 2015-10-01 DIAGNOSIS — M545 Low back pain: Secondary | ICD-10-CM | POA: Diagnosis not present

## 2015-10-01 DIAGNOSIS — I10 Essential (primary) hypertension: Secondary | ICD-10-CM | POA: Diagnosis not present

## 2015-10-01 DIAGNOSIS — I252 Old myocardial infarction: Secondary | ICD-10-CM | POA: Diagnosis not present

## 2015-10-01 DIAGNOSIS — J449 Chronic obstructive pulmonary disease, unspecified: Secondary | ICD-10-CM | POA: Diagnosis not present

## 2015-10-01 DIAGNOSIS — Z9181 History of falling: Secondary | ICD-10-CM | POA: Diagnosis not present

## 2015-10-01 DIAGNOSIS — I2510 Atherosclerotic heart disease of native coronary artery without angina pectoris: Secondary | ICD-10-CM | POA: Diagnosis not present

## 2015-10-01 DIAGNOSIS — I5032 Chronic diastolic (congestive) heart failure: Secondary | ICD-10-CM | POA: Diagnosis not present

## 2015-10-01 DIAGNOSIS — R296 Repeated falls: Secondary | ICD-10-CM | POA: Diagnosis not present

## 2015-10-01 DIAGNOSIS — E119 Type 2 diabetes mellitus without complications: Secondary | ICD-10-CM | POA: Diagnosis not present

## 2015-10-07 DIAGNOSIS — E119 Type 2 diabetes mellitus without complications: Secondary | ICD-10-CM | POA: Diagnosis not present

## 2015-10-07 DIAGNOSIS — I10 Essential (primary) hypertension: Secondary | ICD-10-CM | POA: Diagnosis not present

## 2015-10-07 DIAGNOSIS — I5032 Chronic diastolic (congestive) heart failure: Secondary | ICD-10-CM | POA: Diagnosis not present

## 2015-10-07 DIAGNOSIS — J449 Chronic obstructive pulmonary disease, unspecified: Secondary | ICD-10-CM | POA: Diagnosis not present

## 2015-10-07 DIAGNOSIS — I252 Old myocardial infarction: Secondary | ICD-10-CM | POA: Diagnosis not present

## 2015-10-07 DIAGNOSIS — R296 Repeated falls: Secondary | ICD-10-CM | POA: Diagnosis not present

## 2015-10-07 DIAGNOSIS — I2510 Atherosclerotic heart disease of native coronary artery without angina pectoris: Secondary | ICD-10-CM | POA: Diagnosis not present

## 2015-10-07 DIAGNOSIS — Z9181 History of falling: Secondary | ICD-10-CM | POA: Diagnosis not present

## 2015-10-07 DIAGNOSIS — M545 Low back pain: Secondary | ICD-10-CM | POA: Diagnosis not present

## 2015-10-08 DIAGNOSIS — I5032 Chronic diastolic (congestive) heart failure: Secondary | ICD-10-CM | POA: Diagnosis not present

## 2015-10-08 DIAGNOSIS — Z9181 History of falling: Secondary | ICD-10-CM | POA: Diagnosis not present

## 2015-10-08 DIAGNOSIS — M545 Low back pain: Secondary | ICD-10-CM | POA: Diagnosis not present

## 2015-10-08 DIAGNOSIS — E119 Type 2 diabetes mellitus without complications: Secondary | ICD-10-CM | POA: Diagnosis not present

## 2015-10-08 DIAGNOSIS — R296 Repeated falls: Secondary | ICD-10-CM | POA: Diagnosis not present

## 2015-10-08 DIAGNOSIS — I10 Essential (primary) hypertension: Secondary | ICD-10-CM | POA: Diagnosis not present

## 2015-10-08 DIAGNOSIS — J449 Chronic obstructive pulmonary disease, unspecified: Secondary | ICD-10-CM | POA: Diagnosis not present

## 2015-10-08 DIAGNOSIS — I2510 Atherosclerotic heart disease of native coronary artery without angina pectoris: Secondary | ICD-10-CM | POA: Diagnosis not present

## 2015-10-08 DIAGNOSIS — I252 Old myocardial infarction: Secondary | ICD-10-CM | POA: Diagnosis not present

## 2015-10-14 DIAGNOSIS — I5032 Chronic diastolic (congestive) heart failure: Secondary | ICD-10-CM | POA: Diagnosis not present

## 2015-10-14 DIAGNOSIS — R296 Repeated falls: Secondary | ICD-10-CM | POA: Diagnosis not present

## 2015-10-14 DIAGNOSIS — E119 Type 2 diabetes mellitus without complications: Secondary | ICD-10-CM | POA: Diagnosis not present

## 2015-10-14 DIAGNOSIS — M545 Low back pain: Secondary | ICD-10-CM | POA: Diagnosis not present

## 2015-10-14 DIAGNOSIS — Z9181 History of falling: Secondary | ICD-10-CM | POA: Diagnosis not present

## 2015-10-14 DIAGNOSIS — I252 Old myocardial infarction: Secondary | ICD-10-CM | POA: Diagnosis not present

## 2015-10-14 DIAGNOSIS — I10 Essential (primary) hypertension: Secondary | ICD-10-CM | POA: Diagnosis not present

## 2015-10-14 DIAGNOSIS — J449 Chronic obstructive pulmonary disease, unspecified: Secondary | ICD-10-CM | POA: Diagnosis not present

## 2015-10-14 DIAGNOSIS — I2510 Atherosclerotic heart disease of native coronary artery without angina pectoris: Secondary | ICD-10-CM | POA: Diagnosis not present

## 2015-10-15 DIAGNOSIS — Z9181 History of falling: Secondary | ICD-10-CM | POA: Diagnosis not present

## 2015-10-15 DIAGNOSIS — M545 Low back pain: Secondary | ICD-10-CM | POA: Diagnosis not present

## 2015-10-15 DIAGNOSIS — I10 Essential (primary) hypertension: Secondary | ICD-10-CM | POA: Diagnosis not present

## 2015-10-15 DIAGNOSIS — R296 Repeated falls: Secondary | ICD-10-CM | POA: Diagnosis not present

## 2015-10-15 DIAGNOSIS — I5032 Chronic diastolic (congestive) heart failure: Secondary | ICD-10-CM | POA: Diagnosis not present

## 2015-10-15 DIAGNOSIS — I2510 Atherosclerotic heart disease of native coronary artery without angina pectoris: Secondary | ICD-10-CM | POA: Diagnosis not present

## 2015-10-15 DIAGNOSIS — I252 Old myocardial infarction: Secondary | ICD-10-CM | POA: Diagnosis not present

## 2015-10-15 DIAGNOSIS — E119 Type 2 diabetes mellitus without complications: Secondary | ICD-10-CM | POA: Diagnosis not present

## 2015-10-15 DIAGNOSIS — J449 Chronic obstructive pulmonary disease, unspecified: Secondary | ICD-10-CM | POA: Diagnosis not present

## 2015-10-28 DIAGNOSIS — M7590 Shoulder lesion, unspecified, unspecified shoulder: Secondary | ICD-10-CM | POA: Diagnosis not present

## 2015-10-28 DIAGNOSIS — E1121 Type 2 diabetes mellitus with diabetic nephropathy: Secondary | ICD-10-CM | POA: Diagnosis not present

## 2015-10-28 DIAGNOSIS — I25119 Atherosclerotic heart disease of native coronary artery with unspecified angina pectoris: Secondary | ICD-10-CM | POA: Diagnosis not present

## 2015-10-28 DIAGNOSIS — M545 Low back pain: Secondary | ICD-10-CM | POA: Diagnosis not present

## 2015-11-30 ENCOUNTER — Encounter (HOSPITAL_COMMUNITY): Payer: Self-pay | Admitting: *Deleted

## 2015-11-30 ENCOUNTER — Emergency Department (HOSPITAL_COMMUNITY)
Admission: EM | Admit: 2015-11-30 | Discharge: 2015-11-30 | Disposition: A | Discharge status: Home / Self Care | Payer: Commercial Managed Care - HMO | Attending: Emergency Medicine | Admitting: Emergency Medicine

## 2015-11-30 DIAGNOSIS — M159 Polyosteoarthritis, unspecified: Secondary | ICD-10-CM | POA: Insufficient documentation

## 2015-11-30 DIAGNOSIS — E785 Hyperlipidemia, unspecified: Secondary | ICD-10-CM | POA: Insufficient documentation

## 2015-11-30 DIAGNOSIS — G8929 Other chronic pain: Secondary | ICD-10-CM | POA: Diagnosis not present

## 2015-11-30 DIAGNOSIS — Z87442 Personal history of urinary calculi: Secondary | ICD-10-CM | POA: Diagnosis not present

## 2015-11-30 DIAGNOSIS — E669 Obesity, unspecified: Secondary | ICD-10-CM | POA: Insufficient documentation

## 2015-11-30 DIAGNOSIS — K219 Gastro-esophageal reflux disease without esophagitis: Secondary | ICD-10-CM | POA: Insufficient documentation

## 2015-11-30 DIAGNOSIS — J449 Chronic obstructive pulmonary disease, unspecified: Secondary | ICD-10-CM | POA: Diagnosis not present

## 2015-11-30 DIAGNOSIS — Z86718 Personal history of other venous thrombosis and embolism: Secondary | ICD-10-CM | POA: Insufficient documentation

## 2015-11-30 DIAGNOSIS — Z7984 Long term (current) use of oral hypoglycemic drugs: Secondary | ICD-10-CM | POA: Insufficient documentation

## 2015-11-30 DIAGNOSIS — Z7982 Long term (current) use of aspirin: Secondary | ICD-10-CM | POA: Insufficient documentation

## 2015-11-30 DIAGNOSIS — Z8669 Personal history of other diseases of the nervous system and sense organs: Secondary | ICD-10-CM | POA: Diagnosis not present

## 2015-11-30 DIAGNOSIS — E860 Dehydration: Secondary | ICD-10-CM | POA: Diagnosis not present

## 2015-11-30 DIAGNOSIS — Z87828 Personal history of other (healed) physical injury and trauma: Secondary | ICD-10-CM | POA: Insufficient documentation

## 2015-11-30 DIAGNOSIS — Z951 Presence of aortocoronary bypass graft: Secondary | ICD-10-CM | POA: Diagnosis not present

## 2015-11-30 DIAGNOSIS — R197 Diarrhea, unspecified: Secondary | ICD-10-CM

## 2015-11-30 DIAGNOSIS — Z8673 Personal history of transient ischemic attack (TIA), and cerebral infarction without residual deficits: Secondary | ICD-10-CM | POA: Diagnosis not present

## 2015-11-30 DIAGNOSIS — R1110 Vomiting, unspecified: Secondary | ICD-10-CM

## 2015-11-30 DIAGNOSIS — I25119 Atherosclerotic heart disease of native coronary artery with unspecified angina pectoris: Secondary | ICD-10-CM | POA: Diagnosis not present

## 2015-11-30 DIAGNOSIS — R1084 Generalized abdominal pain: Secondary | ICD-10-CM | POA: Diagnosis present

## 2015-11-30 DIAGNOSIS — Z862 Personal history of diseases of the blood and blood-forming organs and certain disorders involving the immune mechanism: Secondary | ICD-10-CM | POA: Diagnosis not present

## 2015-11-30 DIAGNOSIS — N289 Disorder of kidney and ureter, unspecified: Secondary | ICD-10-CM | POA: Insufficient documentation

## 2015-11-30 DIAGNOSIS — Z7902 Long term (current) use of antithrombotics/antiplatelets: Secondary | ICD-10-CM | POA: Diagnosis not present

## 2015-11-30 DIAGNOSIS — Z950 Presence of cardiac pacemaker: Secondary | ICD-10-CM | POA: Insufficient documentation

## 2015-11-30 DIAGNOSIS — I252 Old myocardial infarction: Secondary | ICD-10-CM | POA: Diagnosis not present

## 2015-11-30 DIAGNOSIS — G40909 Epilepsy, unspecified, not intractable, without status epilepticus: Secondary | ICD-10-CM | POA: Insufficient documentation

## 2015-11-30 DIAGNOSIS — Z791 Long term (current) use of non-steroidal anti-inflammatories (NSAID): Secondary | ICD-10-CM | POA: Insufficient documentation

## 2015-11-30 DIAGNOSIS — Z8711 Personal history of peptic ulcer disease: Secondary | ICD-10-CM | POA: Insufficient documentation

## 2015-11-30 DIAGNOSIS — E119 Type 2 diabetes mellitus without complications: Secondary | ICD-10-CM | POA: Diagnosis not present

## 2015-11-30 DIAGNOSIS — I10 Essential (primary) hypertension: Secondary | ICD-10-CM | POA: Diagnosis not present

## 2015-11-30 LAB — CBC WITH DIFFERENTIAL/PLATELET
BASOS PCT: 0 %
Basophils Absolute: 0 10*3/uL (ref 0.0–0.1)
EOS ABS: 0.1 10*3/uL (ref 0.0–0.7)
EOS PCT: 1 %
HCT: 38.5 % (ref 36.0–46.0)
HEMOGLOBIN: 12.1 g/dL (ref 12.0–15.0)
LYMPHS ABS: 1.2 10*3/uL (ref 0.7–4.0)
Lymphocytes Relative: 12 %
MCH: 25.2 pg — AB (ref 26.0–34.0)
MCHC: 31.4 g/dL (ref 30.0–36.0)
MCV: 80 fL (ref 78.0–100.0)
MONOS PCT: 5 %
Monocytes Absolute: 0.5 10*3/uL (ref 0.1–1.0)
NEUTROS PCT: 82 %
Neutro Abs: 8.4 10*3/uL — ABNORMAL HIGH (ref 1.7–7.7)
PLATELETS: 296 10*3/uL (ref 150–400)
RBC: 4.81 MIL/uL (ref 3.87–5.11)
RDW: 17.9 % — ABNORMAL HIGH (ref 11.5–15.5)
WBC: 10.3 10*3/uL (ref 4.0–10.5)

## 2015-11-30 LAB — COMPREHENSIVE METABOLIC PANEL
ALBUMIN: 3.9 g/dL (ref 3.5–5.0)
ALT: 16 U/L (ref 14–54)
ANION GAP: 11 (ref 5–15)
AST: 23 U/L (ref 15–41)
Alkaline Phosphatase: 52 U/L (ref 38–126)
BUN: 23 mg/dL — ABNORMAL HIGH (ref 6–20)
CHLORIDE: 104 mmol/L (ref 101–111)
CO2: 26 mmol/L (ref 22–32)
Calcium: 11.3 mg/dL — ABNORMAL HIGH (ref 8.9–10.3)
Creatinine, Ser: 1.73 mg/dL — ABNORMAL HIGH (ref 0.44–1.00)
GFR calc non Af Amer: 27 mL/min — ABNORMAL LOW (ref >60)
GFR, EST AFRICAN AMERICAN: 32 mL/min — AB (ref >60)
GLUCOSE: 153 mg/dL — AB (ref 65–99)
POTASSIUM: 4.5 mmol/L (ref 3.5–5.1)
SODIUM: 141 mmol/L (ref 135–145)
Total Bilirubin: 0.9 mg/dL (ref 0.3–1.2)
Total Protein: 8.1 g/dL (ref 6.5–8.1)

## 2015-11-30 LAB — LIPASE, BLOOD: Lipase: 65 U/L — ABNORMAL HIGH (ref 11–51)

## 2015-11-30 MED ORDER — HEPARIN SOD (PORK) LOCK FLUSH 100 UNIT/ML IV SOLN
INTRAVENOUS | Status: DC
Start: 2015-11-30 — End: 2015-12-01
  Filled 2015-11-30: qty 5

## 2015-11-30 MED ORDER — ONDANSETRON HCL 4 MG/2ML IJ SOLN
4.0000 mg | Freq: Once | INTRAMUSCULAR | Status: AC
  Administered 2015-11-30: 4 mg via INTRAVENOUS
  Filled 2015-11-30: qty 2

## 2015-11-30 MED ORDER — SODIUM CHLORIDE 0.9 % IV BOLUS (SEPSIS)
1000.0000 mL | Freq: Once | INTRAVENOUS | Status: AC
  Administered 2015-11-30: 1000 mL via INTRAVENOUS

## 2015-11-30 MED ORDER — FENTANYL CITRATE (PF) 100 MCG/2ML IJ SOLN
50.0000 ug | Freq: Once | INTRAMUSCULAR | Status: AC
  Administered 2015-11-30: 50 ug via INTRAVENOUS
  Filled 2015-11-30: qty 2

## 2015-11-30 NOTE — ED Notes (Signed)
Pt is able to keep ice water down at this time.

## 2015-11-30 NOTE — ED Notes (Signed)
Pt c/o abdominal pain and n/v/d since after 10am today.

## 2015-11-30 NOTE — ED Provider Notes (Signed)
CSN: [id number]     Arrival date & time 11/30/15  1953 History   First MD Initiated Contact with Patient 11/30/15 2147     Chief Complaint  Patient presents with  . Abdominal Pain     Patient is a 74 y.o. female presenting with abdominal pain. The history is provided by the patient and a relative.  Abdominal Pain Pain location:  Generalized Pain quality: cramping   Pain severity:  Moderate Onset quality:  Gradual Duration:  1 day Timing:  Intermittent Progression:  Worsening Chronicity:  New Relieved by:  Nothing Worsened by:  Movement and palpation Associated symptoms: chills, diarrhea, fatigue, fever and vomiting   Associated symptoms: no cough and no dysuria    Patient presents with nausea/vomiting/diarrhea (nonbloody) She reports multiple episodes of diarrhea today She reports fever/chills No travel No sick contacts  Past Medical History  Diagnosis Date  . Hypertension   . CAD (coronary artery disease)     multivessel, CABG 6/10.  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  Myoview (11/20/11) showed no ischemia & EF 62%  . Hyperlipidemia   . Deep vein thrombosis, upper right extremity (HCC)     post-PPM  . Obesity   . Dehiscence of closure of sternum or sternotomy 07/2009    sternal infection; required flap closure  . COPD (chronic obstructive pulmonary disease) (HCC)     severe lung disease by PFTs 6/12  . Seizure disorder (HCC)   . 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 PUD (peptic ulcer disease)   . Pacemaker   . Sleep apnea     denies CPAP use on 09/30/2013  . Diabetes mellitus, type II (HCC)   . H/O hiatal hernia   . Migraines     "years ago" (09/30/2013)  . DDD (degenerative disc disease)   . Arthritis     "legs and back" (09/30/2013)  . Chronic lower back pain   . Urge incontinence of urine   . Myocardial infarction (HCC)     years ago  . Anginal pain (HCC)     "all the time, but not heart related"  . Asthma     years  ago  . Orthopnea     sometimes  . Stroke [facility])     years ago, "residual on left back"  . History of kidney stones   . Gastroesophageal reflux disease     not much  . Seizures (HCC)     "long time ago; don't remember what they were related to" (09/30/2013)none recent  . Anemia   . Blurred vision, bilateral     sometimes double vision   Past Surgical History  Procedure Laterality Date  . Pacemaker placement  1993    Status post DDD pacemaker implantation in 1993, with Medtronic Kappa generator change in 2003 for  second-degree AV block, most recent gen change by Fawn Kirk 04/14/11  . Total hip arthroplasty Right   . Replacement total knee Bilateral   . Thyroidectomy    . Wrist surgery Left     "tumor taken off" (09/30/2013)  . Coronary artery bypass graft  05/1999    - LIMA to LAD, SVG to OM, SVG to PDA  . Reconstructive repair sternal      for infection s/p CABG 8/10  . Esophagogastroduodenoscopy      with [facility] dilation,  biopsy, and disruption Schatzki's ring  . Colonoscopy  04/27/2005    hemorrhoids, diverticula  . Esophagogastroduodenoscopy  09/10/2012  RMR: Noncritical Schatzki's ring. Diffuse gastric erosions and an  area of partially healing ulceration-status post biopsy/ Small hiatal hernia. Bx reactive gastropathy.  . Colonoscopy with esophagogastroduodenoscopy (egd) N/A 02/20/2013    RMR: pancolonic diverticulosis and internal hemorrhoids. EGD with small hiatal hernia, healed gastric ulcer  . Portacath placement Left 07/17/2013    Procedure: INSERTION PORT-A-CATH-left subclavian;  Surgeon: Fabio Bering, MD;  Location: [facility];  Service: General;  Laterality: Left;  . Appendectomy    . Cholecystectomy    . Insert / replace / remove pacemaker  1993  . Lumbar disc surgery    . Posterior lumbar fusion    . Tubal ligation    . Cesarean section  ~1980  . Abdominal hysterectomy      partial  . Back surgery      x3 total  . Total hip arthroplasty Left 12/03/2013     Procedure: LEFT TOTAL HIP ARTHROPLASTY ANTERIOR APPROACH;  Surgeon: Shelda Pal, MD;  Location: [facility];  Service: Orthopedics;  Laterality: Left;  . Joint replacement     Family History  Problem Relation Age of Onset  . Diabetes    . Heart disease Mother     MI  . Coronary artery disease      Female <55  . Cancer    . Colon cancer Father     age 58   Social History  Substance Use Topics  . Smoking status: Never Smoker   . Smokeless tobacco: Former Neurosurgeon    Types: Snuff     Comment: 09/30/2013 "stopped snuff a long time ago"  . Alcohol Use: No   OB History    No data available     Review of Systems  Constitutional: Positive for fever, chills and fatigue.  Respiratory: Negative for cough.   Gastrointestinal: Positive for vomiting, abdominal pain and diarrhea. Negative for blood in stool.  Genitourinary: Negative for dysuria.  All other systems reviewed and are negative.     Allergies  Codeine; Other; Oxycodone hcl; Peanuts; and Reglan  Home Medications   Prior to Admission medications   Medication Sig Start Date End Date Taking? Authorizing Provider  acetaminophen (TYLENOL) 325 MG tablet Take 1-2 tablets (325-650 mg total) by mouth every 6 (six) hours as needed. Patient taking differently: Take 325-650 mg by mouth every 6 (six) hours as needed for mild pain.  12/06/13   Lanney Gins, PA-C  apixaban (ELIQUIS) 5 MG TABS tablet Take 5 mg by mouth 2 (two) times daily.     Historical Provider, MD  aspirin 81 MG tablet Take 81 mg by mouth daily.    Historical Provider, MD  atorvastatin (LIPITOR) 40 MG tablet Take 1 tablet (40 mg total) by mouth daily. 09/10/14   Antoine Poche, MD  benazepril (LOTENSIN) 40 MG tablet Take 40 mg by mouth every morning.    Historical Provider, MD  dicyclomine (BENTYL) 20 MG tablet Take one every 6 hours as needed for abd cramps 02/12/15   Bethann Berkshire, MD  docusate sodium 100 MG CAPS Take 100 mg by mouth 2 (two) times daily. 12/06/13   Lanney Gins, PA-C  doxepin (SINEQUAN) 10 MG capsule Take 10 mg by mouth at bedtime.    Historical Provider, MD  furosemide (LASIX) 20 MG tablet TAKE 1 TABLET BY MOUTH IN THE MORNING. 10/08/14   Marinus Maw, MD  HYDROcodone-acetaminophen (NORCO/VICODIN) 5-325 MG per tablet Take 1 tablet by mouth every 4 (four) hours as needed. 04/01/15  Ivery Quale, PA-C  ketorolac (ACULAR) 0.5 % ophthalmic solution Place 1 drop into the right eye 3 (three) times daily.    Historical Provider, MD  levETIRAcetam (KEPPRA) 500 MG tablet Take 500 mg by mouth 2 (two) times daily.    Historical Provider, MD  lubiprostone (AMITIZA) 24 MCG capsule Take 24 mcg by mouth 2 (two) times daily with a meal.    Historical Provider, MD  metFORMIN (GLUCOPHAGE) 500 MG tablet Take 500 mg by mouth 2 (two) times daily with a meal.     Historical Provider, MD  metoprolol tartrate (LOPRESSOR) 25 MG tablet Take 25 mg by mouth 2 (two) times daily.     Historical Provider, MD  nitroGLYCERIN (NITROSTAT) 0.4 MG SL tablet Place 1 tablet (0.4 mg total) under the tongue every 5 (five) minutes as needed for chest pain. 05/20/14   Antoine Poche, MD  omega-3 acid ethyl esters (LOVAZA) 1 G capsule Take 2 g by mouth 2 (two) times daily.     Historical Provider, MD  ondansetron (ZOFRAN ODT) 4 MG disintegrating tablet  ODT q4 hours prn nausea/vomit 02/12/15   Bethann Berkshire, MD  ondansetron [facility]) 4 MG tablet 1 po q6h prn nausea 04/01/15   Ivery Quale, PA-C  oxybutynin (DITROPAN-XL) 10 MG 24 hr tablet Take 10 mg by mouth every morning.     Historical Provider, MD  pantoprazole (PROTONIX) 40 MG tablet Take 1 tablet (40 mg total) by mouth daily. 12/03/13   Nira Retort, NP  polyethylene glycol [facility] Ethelene Hal) packet Take 17 g by mouth 2 (two) times daily. Patient taking differently: Take 17 g by mouth daily as needed for mild constipation.  12/06/13   Lanney Gins, PA-C  potassium chloride (K-DUR,KLOR-CON) 10 MEQ tablet Take 10 mEq by mouth daily.     Historical Provider, MD  traMADol (ULTRAM) 50 MG tablet Take 1-2 tablets (50-100 mg total) by mouth every 6 (six) hours as needed for moderate pain. 12/06/13   Lanney Gins, PA-C   BP 145/84 mmHg  Pulse 86  Temp(Src) 99 F (37.2 C) (Oral)  Resp 16  Ht 5' (1.524 m)  Wt 83.008 kg  BMI 35.74 kg/m2  SpO2 99% Physical Exam CONSTITUTIONAL: Elderly HEAD: Normocephalic/atraumatic EYES: EOMI/PERRL, no icterus ENMT: Mucous membranes dry NECK: supple no meningeal signs SPINE/BACK:entire spine nontender CV: S1/S2 noted, no murmurs/rubs/gallops noted LUNGS: Lungs are clear to auscultation bilaterally, no apparent distress ABDOMEN: soft, mild diffuse tenderness noted, no rebound or guarding, bowel sounds noted throughout abdomen GU:no cva tenderness NEURO: Pt is awake/alert/appropriate, moves all extremitiesx4.  No facial droop.   EXTREMITIES: pulses normal/equal, full ROM SKIN: warm, color normal PSYCH: no abnormalities of mood noted, alert and oriented to situation  ED Course  Procedures  Medications  heparin lock flush 100 UNIT/ML injection (not administered)  ondansetron (ZOFRAN) injection 4 mg (4 mg Intravenous Given 11/30/15 2214)  sodium chloride 0.9 % bolus 1,000 mL (0 mLs Intravenous Stopped 11/30/15 2344)  fentaNYL (SUBLIMAZE) injection 50 mcg (50 mcg Intravenous Given 11/30/15 2214)    Labs Review Labs Reviewed  CBC WITH DIFFERENTIAL/PLATELET - Abnormal; Notable for the following:    MCH 25.2 (*)    RDW 17.9 (*)    Neutro Abs 8.4 (*)    All other components within normal limits  COMPREHENSIVE METABOLIC PANEL - Abnormal; Notable for the following:    Glucose, Bld 153 (*)    BUN 23 (*)    Creatinine, Ser 1.73 (*)    Calcium  11.3 (*)    GFR calc non Af Amer 27 (*)    GFR calc Af Amer 32 (*)    All other components within normal limits  LIPASE, BLOOD - Abnormal; Notable for the following:    Lipase 65 (*)    All other components within normal limits   I have  personally reviewed and evaluated these  lab results as part of my medical decision-making.   11:49 PM Pt feels improved BP 136/84 mmHg  Pulse 80  Temp(Src) 99 F (37.2 C) (Oral)  Resp 18  Ht 5' (1.524 m)  Wt 83.008 kg  BMI 35.74 kg/m2  SpO2 98% She is taking PO No vomiting/diarrhea here No focal abd tenderness Suspect viral illness as cause She has no chest symptoms to suggest ACS She has no focal abd tenderness to suggest acute abdominal emergency Advised to increase PO fluids She will hold her lasix this week as she does have some renal insufficiency but I Suspect this will improve if v/d resolve and she increases PO fluids  MDM   Final diagnoses:  Vomiting and diarrhea  Dehydration  Renal insufficiency    Nursing notes including past medical history and social history reviewed and considered in documentation Labs/vital reviewed myself and considered during evaluation     Zadie Rhineonald Keylee Shrestha, MD 11/30/15 2351

## 2015-11-30 NOTE — Discharge Instructions (Signed)

## 2015-12-29 DIAGNOSIS — M47817 Spondylosis without myelopathy or radiculopathy, lumbosacral region: Secondary | ICD-10-CM | POA: Diagnosis not present

## 2015-12-29 DIAGNOSIS — M2550 Pain in unspecified joint: Secondary | ICD-10-CM | POA: Diagnosis not present

## 2015-12-29 DIAGNOSIS — M5136 Other intervertebral disc degeneration, lumbar region: Secondary | ICD-10-CM | POA: Diagnosis not present

## 2015-12-29 DIAGNOSIS — M545 Low back pain: Secondary | ICD-10-CM | POA: Diagnosis not present

## 2016-01-07 DIAGNOSIS — M47817 Spondylosis without myelopathy or radiculopathy, lumbosacral region: Secondary | ICD-10-CM | POA: Diagnosis not present

## 2016-01-07 DIAGNOSIS — M4696 Unspecified inflammatory spondylopathy, lumbar region: Secondary | ICD-10-CM | POA: Diagnosis not present

## 2016-01-07 DIAGNOSIS — M5136 Other intervertebral disc degeneration, lumbar region: Secondary | ICD-10-CM | POA: Diagnosis not present

## 2016-01-07 DIAGNOSIS — M4806 Spinal stenosis, lumbar region: Secondary | ICD-10-CM | POA: Diagnosis not present

## 2016-01-07 DIAGNOSIS — Z981 Arthrodesis status: Secondary | ICD-10-CM | POA: Diagnosis not present

## 2016-02-12 ENCOUNTER — Encounter: Payer: Commercial Managed Care - HMO | Admitting: Internal Medicine

## 2016-02-12 ENCOUNTER — Encounter: Payer: Self-pay | Admitting: Internal Medicine

## 2016-02-29 DIAGNOSIS — M4806 Spinal stenosis, lumbar region: Secondary | ICD-10-CM | POA: Diagnosis not present

## 2016-02-29 DIAGNOSIS — M47816 Spondylosis without myelopathy or radiculopathy, lumbar region: Secondary | ICD-10-CM | POA: Diagnosis not present

## 2016-02-29 DIAGNOSIS — Z7984 Long term (current) use of oral hypoglycemic drugs: Secondary | ICD-10-CM | POA: Diagnosis not present

## 2016-02-29 DIAGNOSIS — F329 Major depressive disorder, single episode, unspecified: Secondary | ICD-10-CM | POA: Diagnosis not present

## 2016-02-29 DIAGNOSIS — Z7901 Long term (current) use of anticoagulants: Secondary | ICD-10-CM | POA: Diagnosis not present

## 2016-02-29 DIAGNOSIS — Z79899 Other long term (current) drug therapy: Secondary | ICD-10-CM | POA: Diagnosis not present

## 2016-02-29 DIAGNOSIS — M4316 Spondylolisthesis, lumbar region: Secondary | ICD-10-CM | POA: Diagnosis not present

## 2016-02-29 DIAGNOSIS — M5136 Other intervertebral disc degeneration, lumbar region: Secondary | ICD-10-CM | POA: Diagnosis not present

## 2016-02-29 DIAGNOSIS — Z7982 Long term (current) use of aspirin: Secondary | ICD-10-CM | POA: Diagnosis not present

## 2016-04-06 IMAGING — CR DG CHEST 2V
2 series · 2 of 2 positions shown · non-contrast
Comparison: Portable chest dated 09/30/2013.

CLINICAL DATA: Lower abdominal pain, nausea, vomiting and fever.

EXAM:
CHEST  2 VIEW

[view not recorded (1 of 2)]
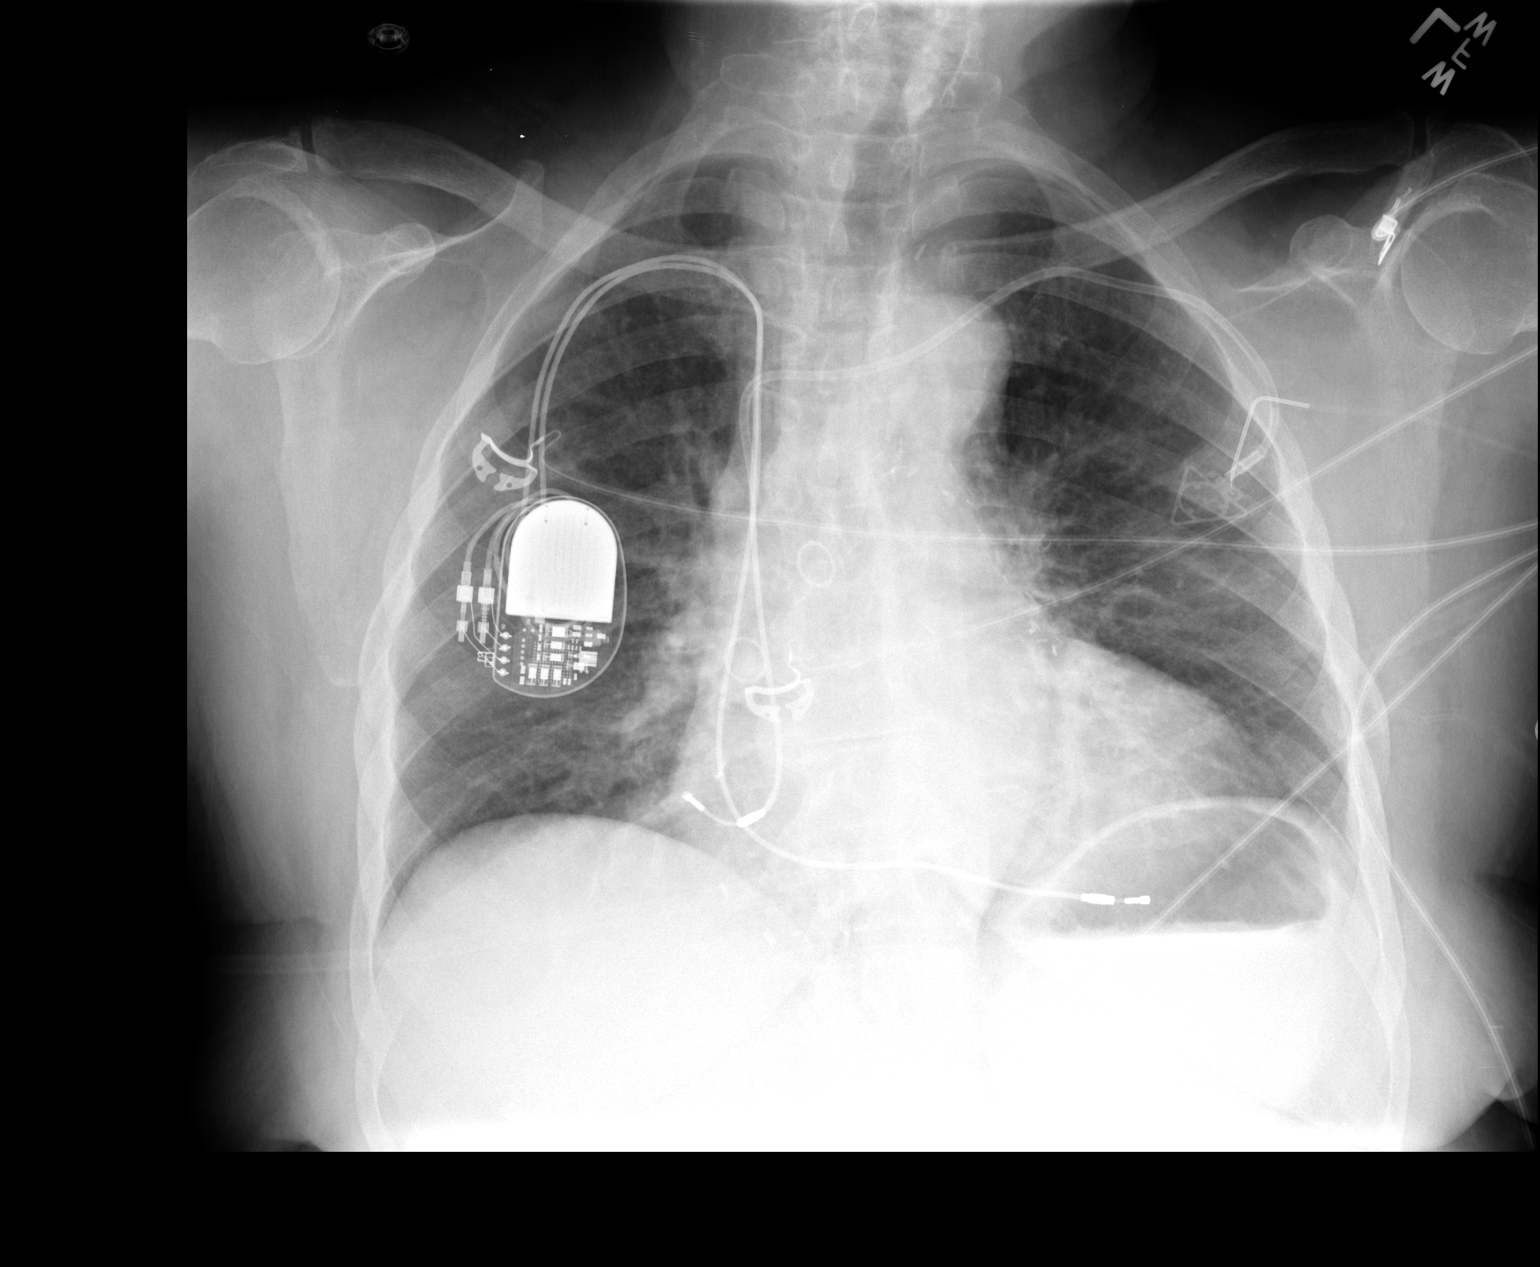

[view not recorded (2 of 2)]
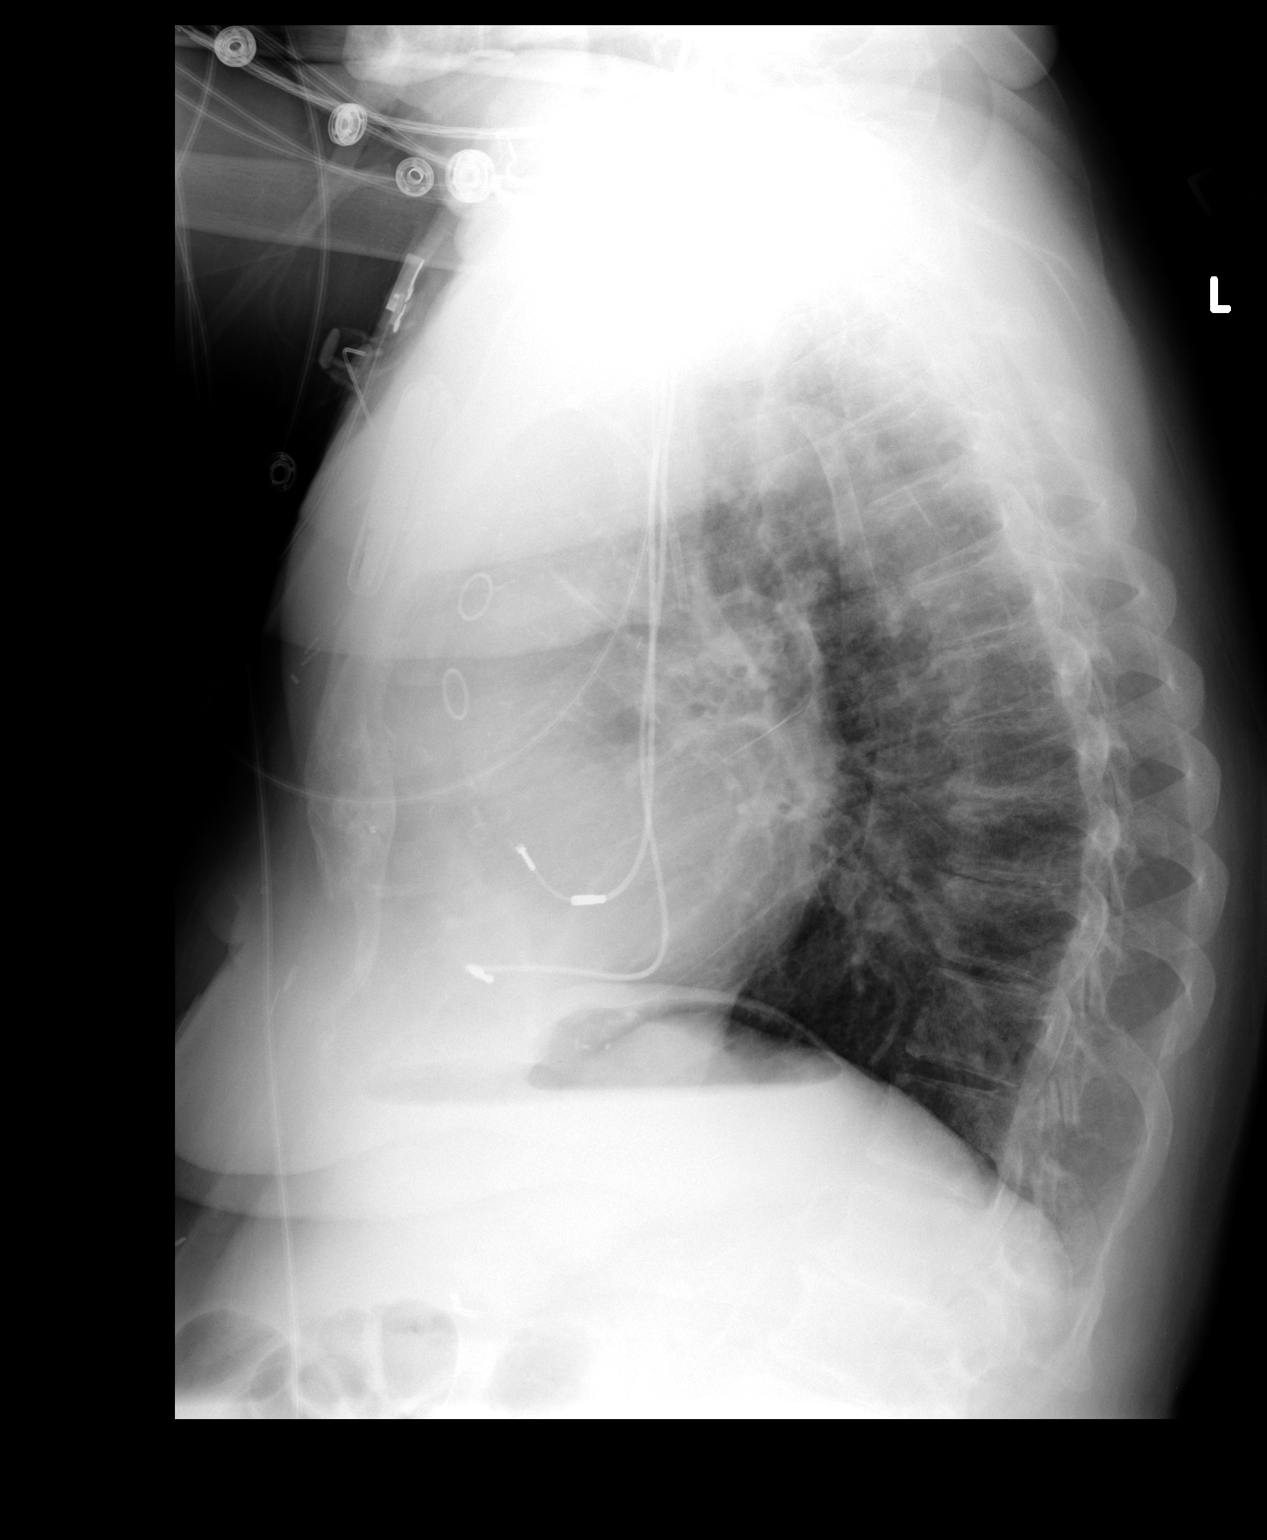

[2 of 2 positions shown; findings below may reference images not displayed]

FINDINGS: Enlarged cardiac silhouette with improvement. Stable post CABG
changes and right subclavian pacemaker leads. Clear lungs with
normal vascularity. Thoracic spine degenerative changes.
IMPRESSION: No acute abnormality.

## 2016-04-06 IMAGING — CT CT ABD-PELV W/ CM
2 of 4 series · 16 of 46 positions shown, 18 images · IV contrast (Omnipaque 300)
Comparison: June 29, 2013

CLINICAL DATA: Nausea, vomiting, fever and lower abdomen pain
starting this morning.

EXAM:
CT ABDOMEN AND PELVIS WITH CONTRAST
TECHNIQUE: Multidetector CT imaging of the abdomen and pelvis was performed
using the standard protocol following bolus administration of
intravenous contrast.
CONTRAST:  100mL OMNIPAQUE IOHEXOL 300 MG/ML SOLN, 50mL OMNIPAQUE
IOHEXOL 300 MG/ML SOLN

[Series 2: abd_pel_with 5.0 b40f · axial · 0.69mm/px · z∈[-272,+118]mm · 13 of 86 slices shown, 15 images]
[im 4/86  soft-tissue]
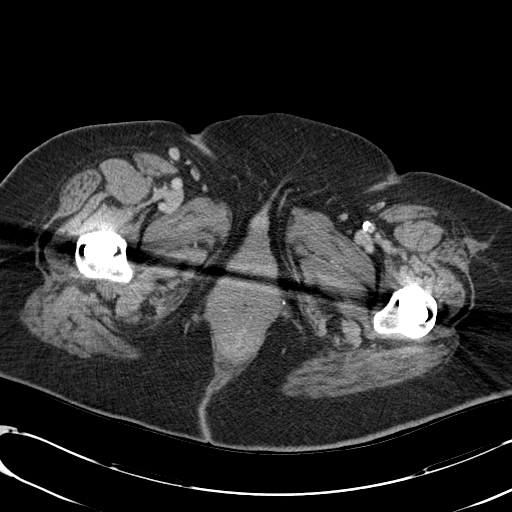
[im 4/86  bone]
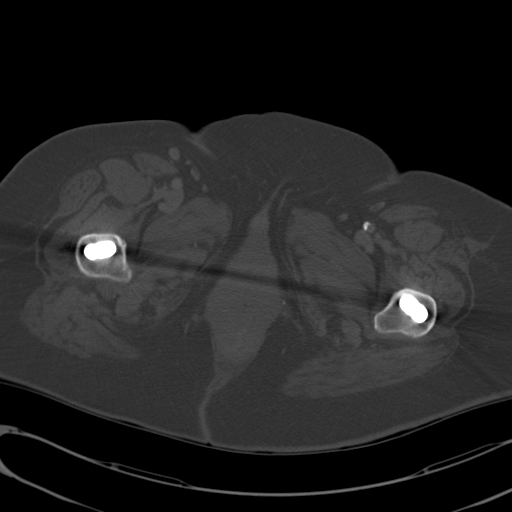
[im 12/86  soft-tissue]
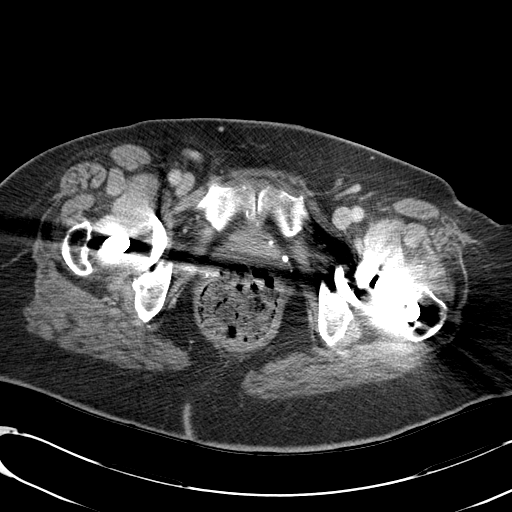
[im 23/86  soft-tissue]
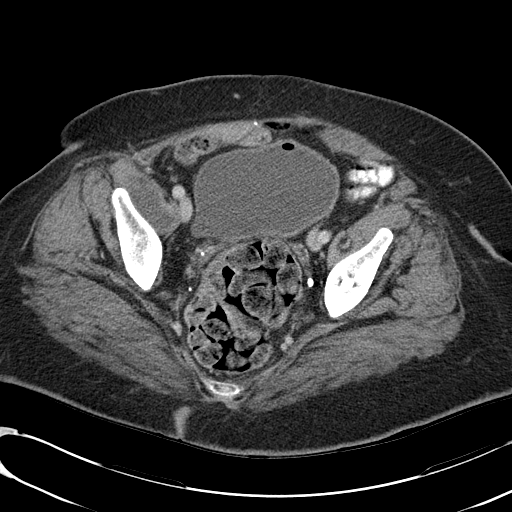
[im 26/86  soft-tissue]
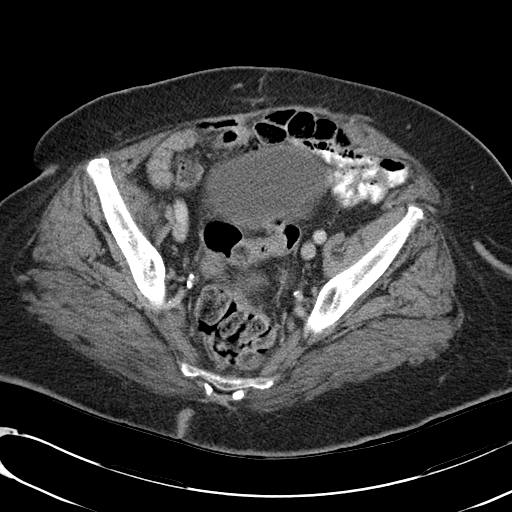
[im 34/86  soft-tissue]
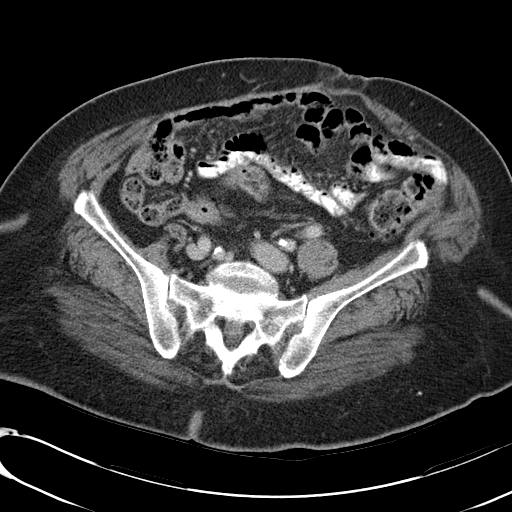
[im 37/86  soft-tissue]
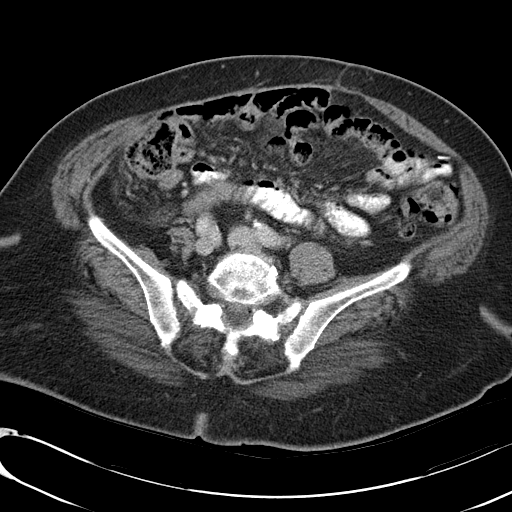
[im 45/86  soft-tissue]
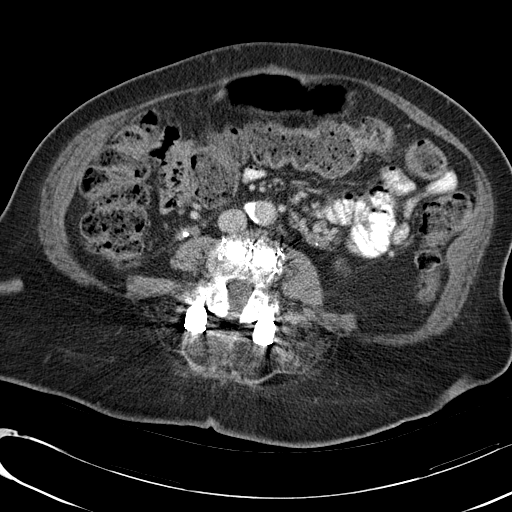
[im 52/86  soft-tissue]
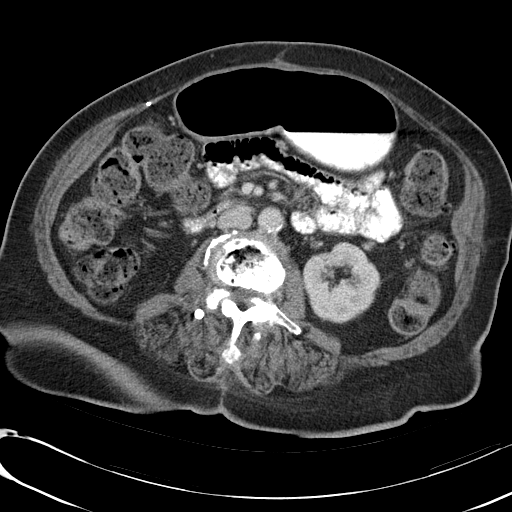
[im 56/86  soft-tissue]
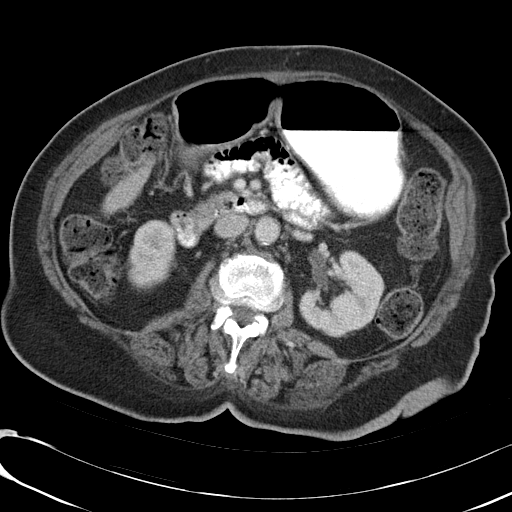
[im 56/86  bone]
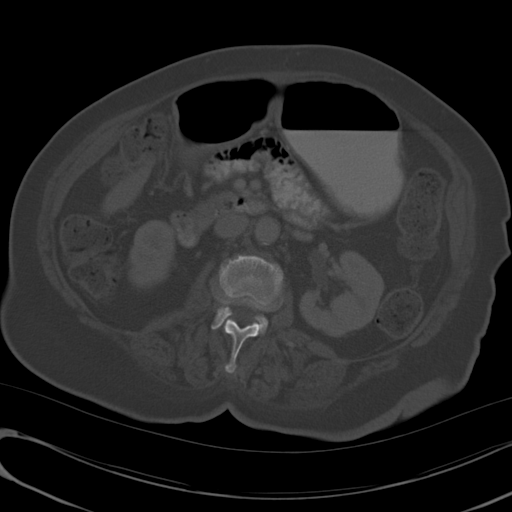
[im 63/86  soft-tissue]
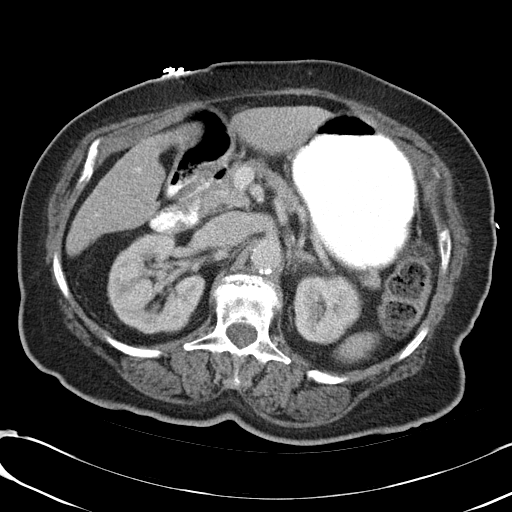
[im 67/86  soft-tissue]
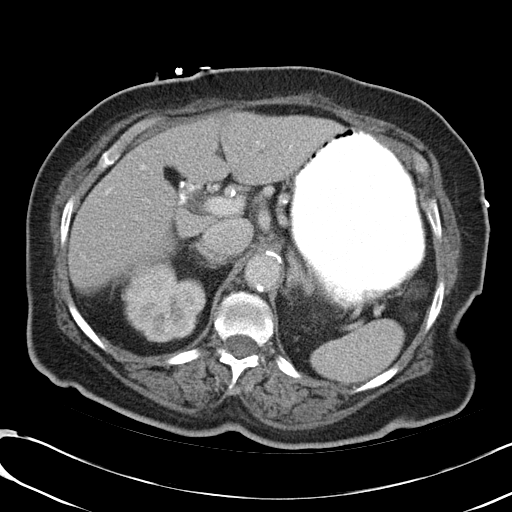
[im 74/86  soft-tissue]
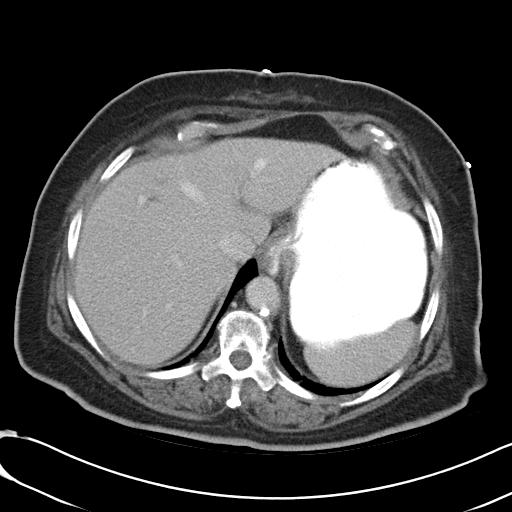
[im 82/86  soft-tissue]
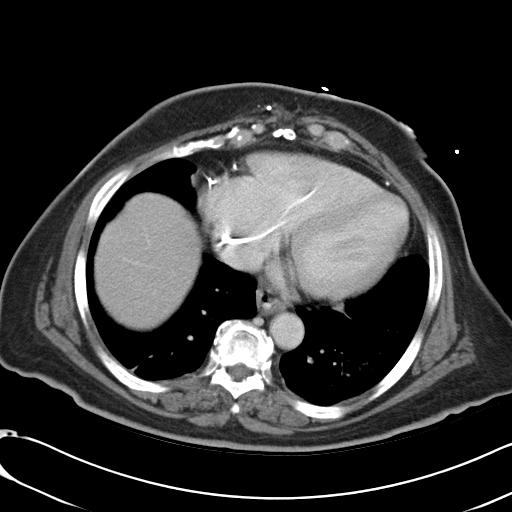

[Series 3: abd_pel_with 3.0 spo cor · coronal · 0.66mm/px · 3 of 89 slices shown]
[im 30/89  soft-tissue]
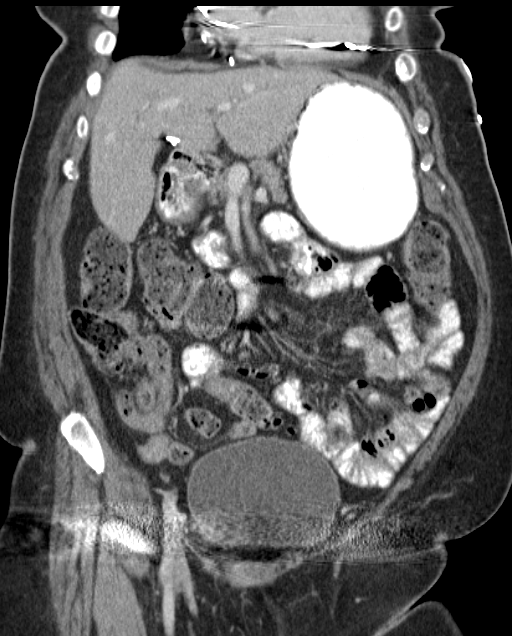
[im 40/89  soft-tissue]
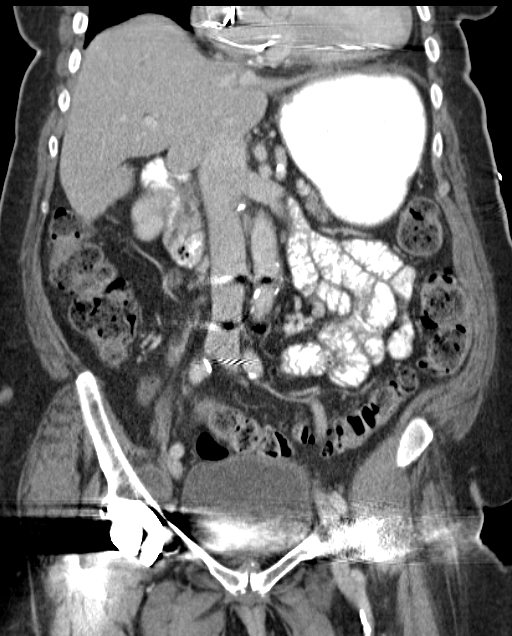
[im 49/89  soft-tissue]
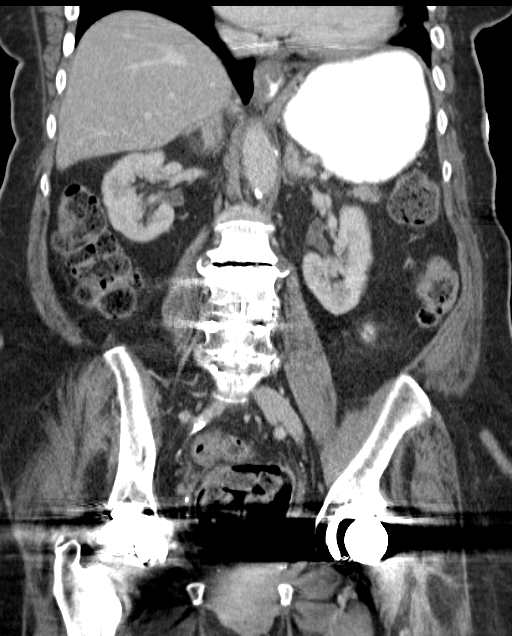

[16 of 46 positions shown; findings below may reference images not displayed]

FINDINGS: There is mild diffuse fatty infiltration of liver. There is mild
intrahepatic biliary ductal dilatation, postsurgical. The patient is
status post prior cholecystectomy. The spleen, pancreas, adrenal
glands and kidneys are normal. There is no hydronephrosis
bilaterally. There is atherosclerosis of the abdominal aorta without
aneurysmal dilatation. There is no abdominal lymphadenopathy. There
is small umbilical herniation of mesenteric fat unchanged.

There is a small hiatal hernia. There is no small bowel obstruction.
Marked bowel content is identified throughout colon. The appendix is
not seen but no inflammation is noted around cecum.

Fluid-filled bladder is identified with a small focus air identified
within the bladder lumen, question recent instrumentation. Clinical
correlation is recommended. Evaluation of the lower pelvis is
limited due to streak artifact from bilateral hip replacements.
There is mild atelectasis of the posterior lungs. The heart size is
enlarged. Patient is status post prior posterior fusion of the
lumbar spine unchanged. Degenerative joint changes of the spine are
noted.
IMPRESSION: No acute abnormality identified in the abdomen and pelvis. No
evidence of bowel obstruction.

Constipation.

## 2016-04-07 ENCOUNTER — Encounter: Payer: Commercial Managed Care - HMO | Admitting: Internal Medicine

## 2016-05-02 IMAGING — CR DG FOOT COMPLETE 3+V*L*
3 series · 3 of 3 positions shown · non-contrast
Comparison: None.

CLINICAL DATA: Left foot pain

EXAM:
LEFT FOOT - COMPLETE 3+ VIEW

[view not recorded (1 of 3)]
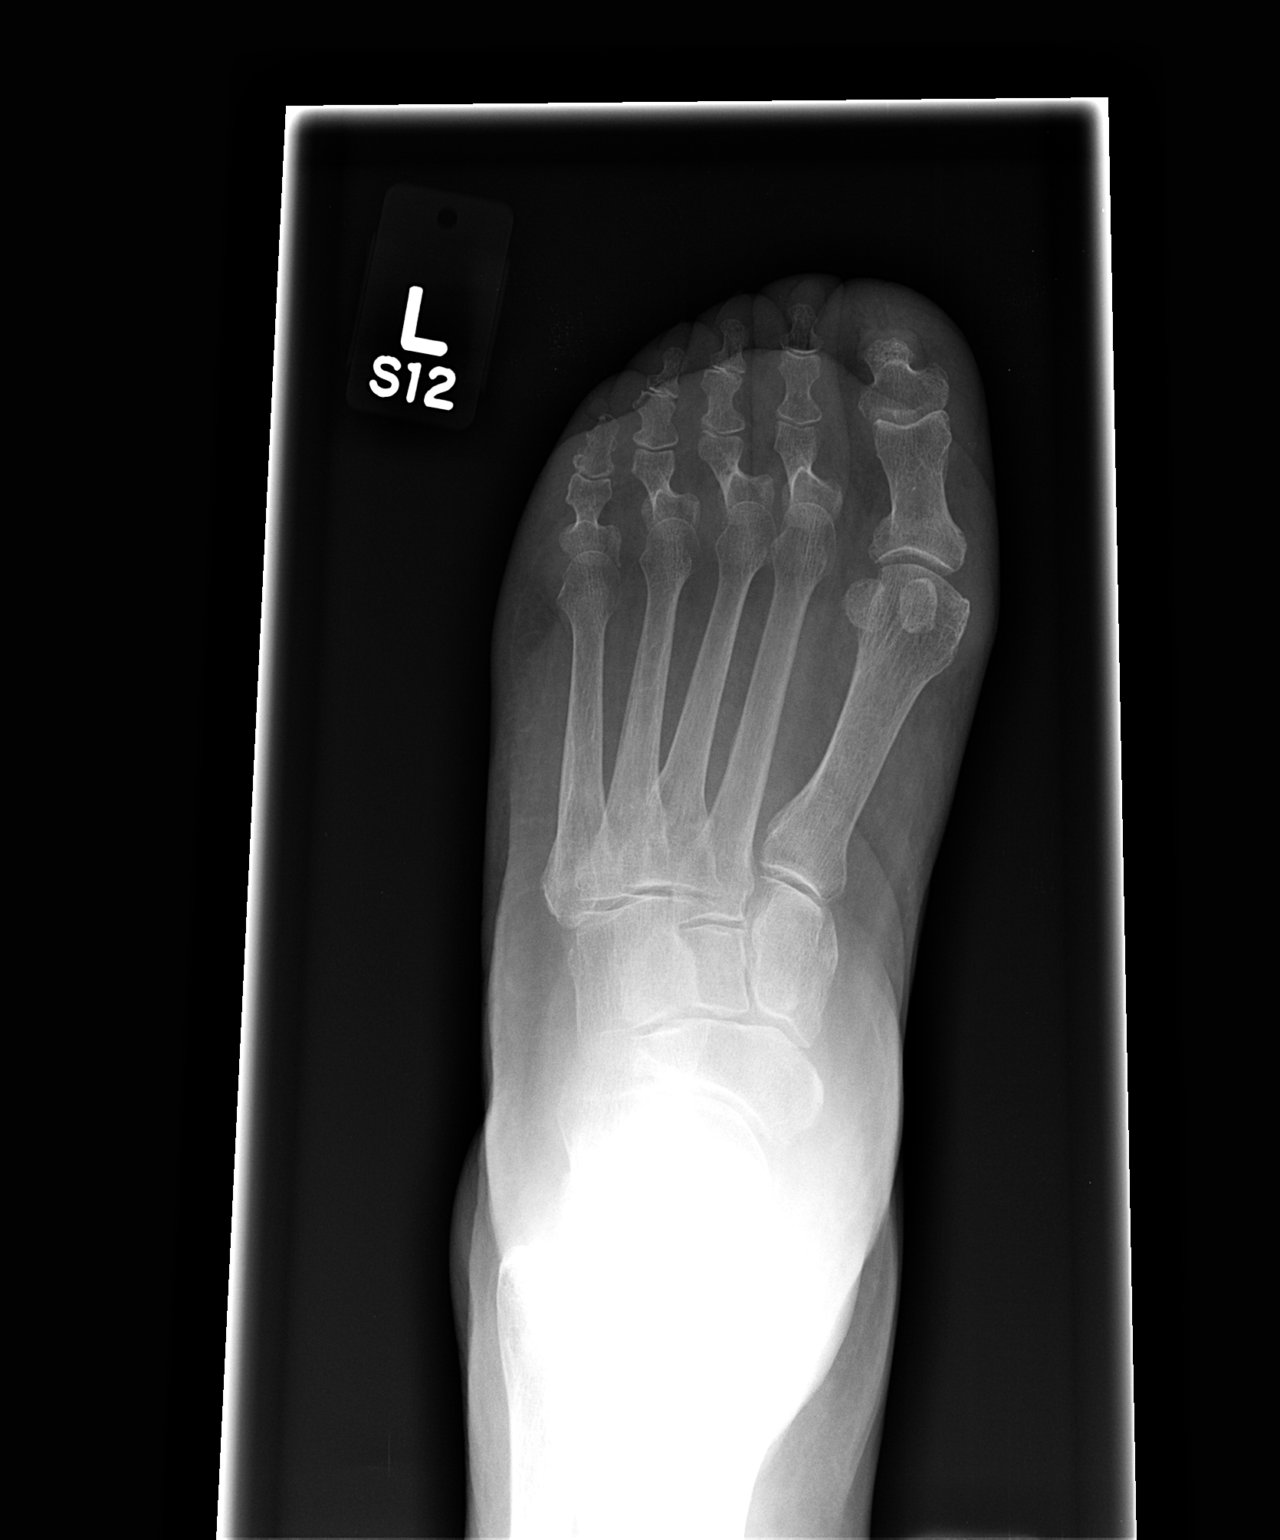

[view not recorded (2 of 3)]
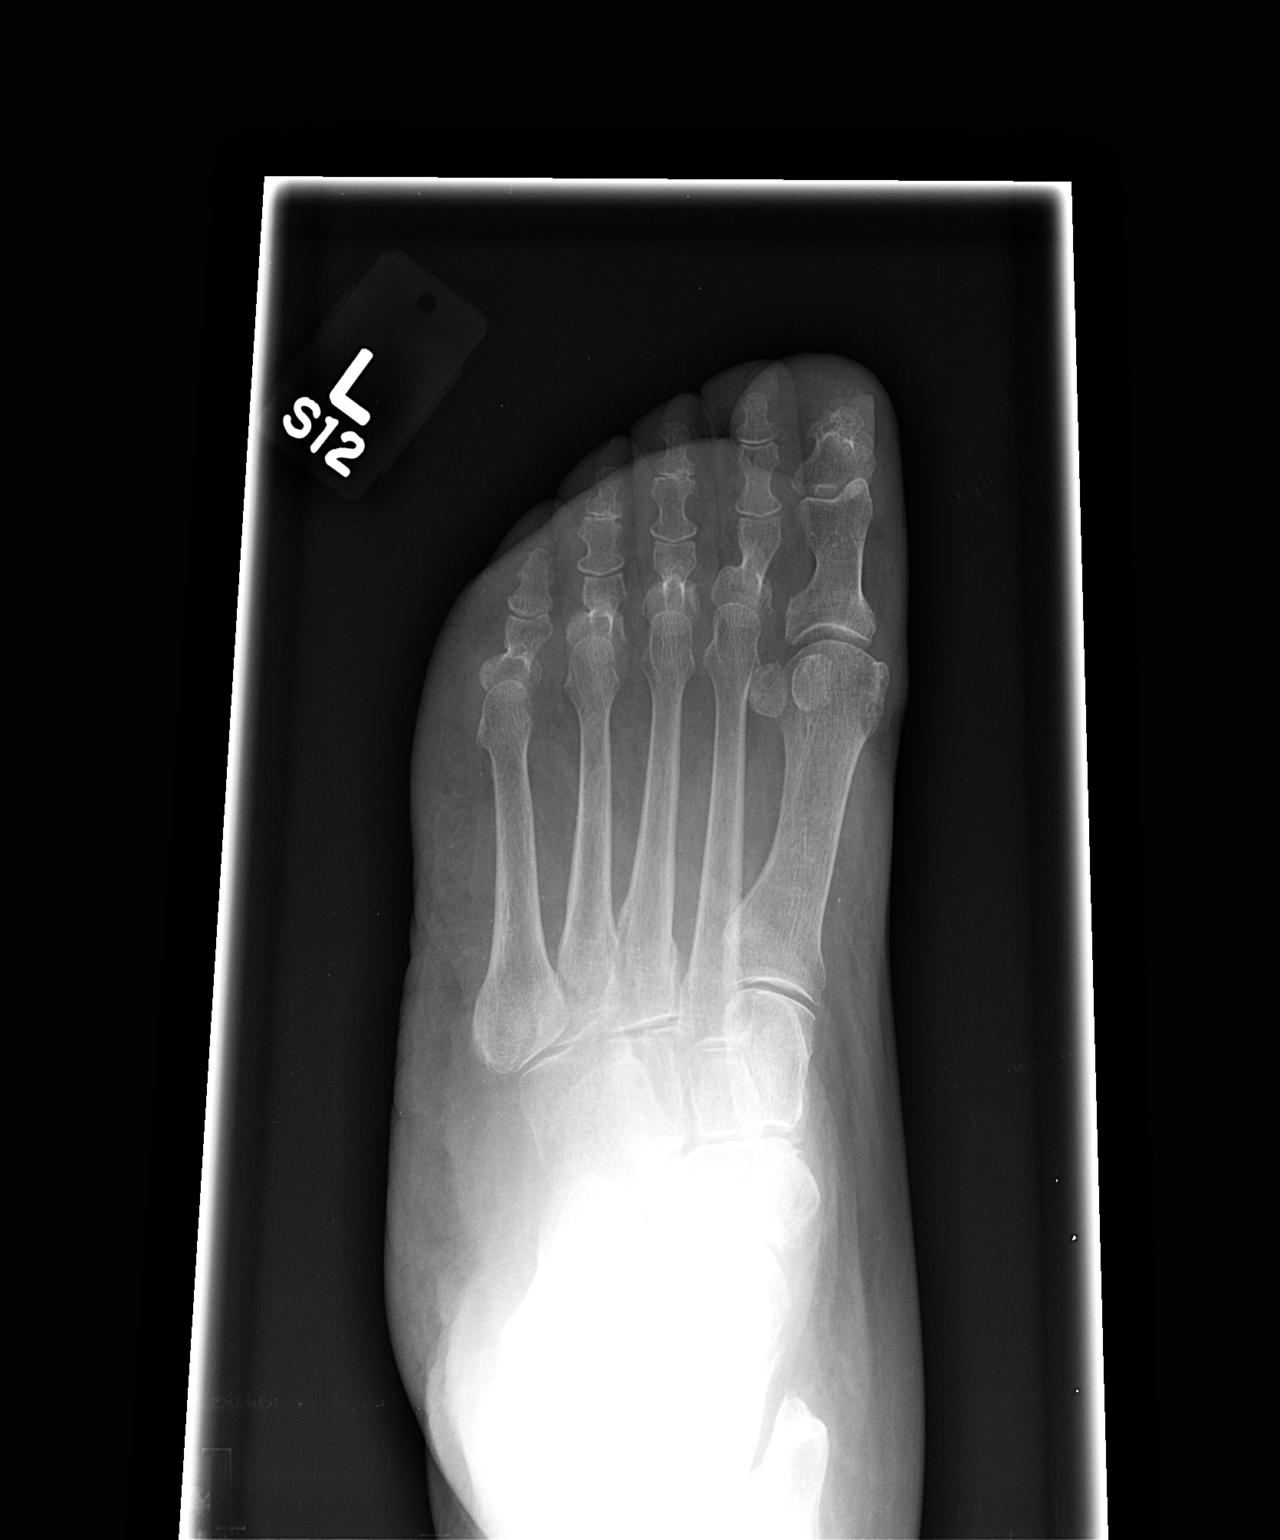

[view not recorded (3 of 3)]
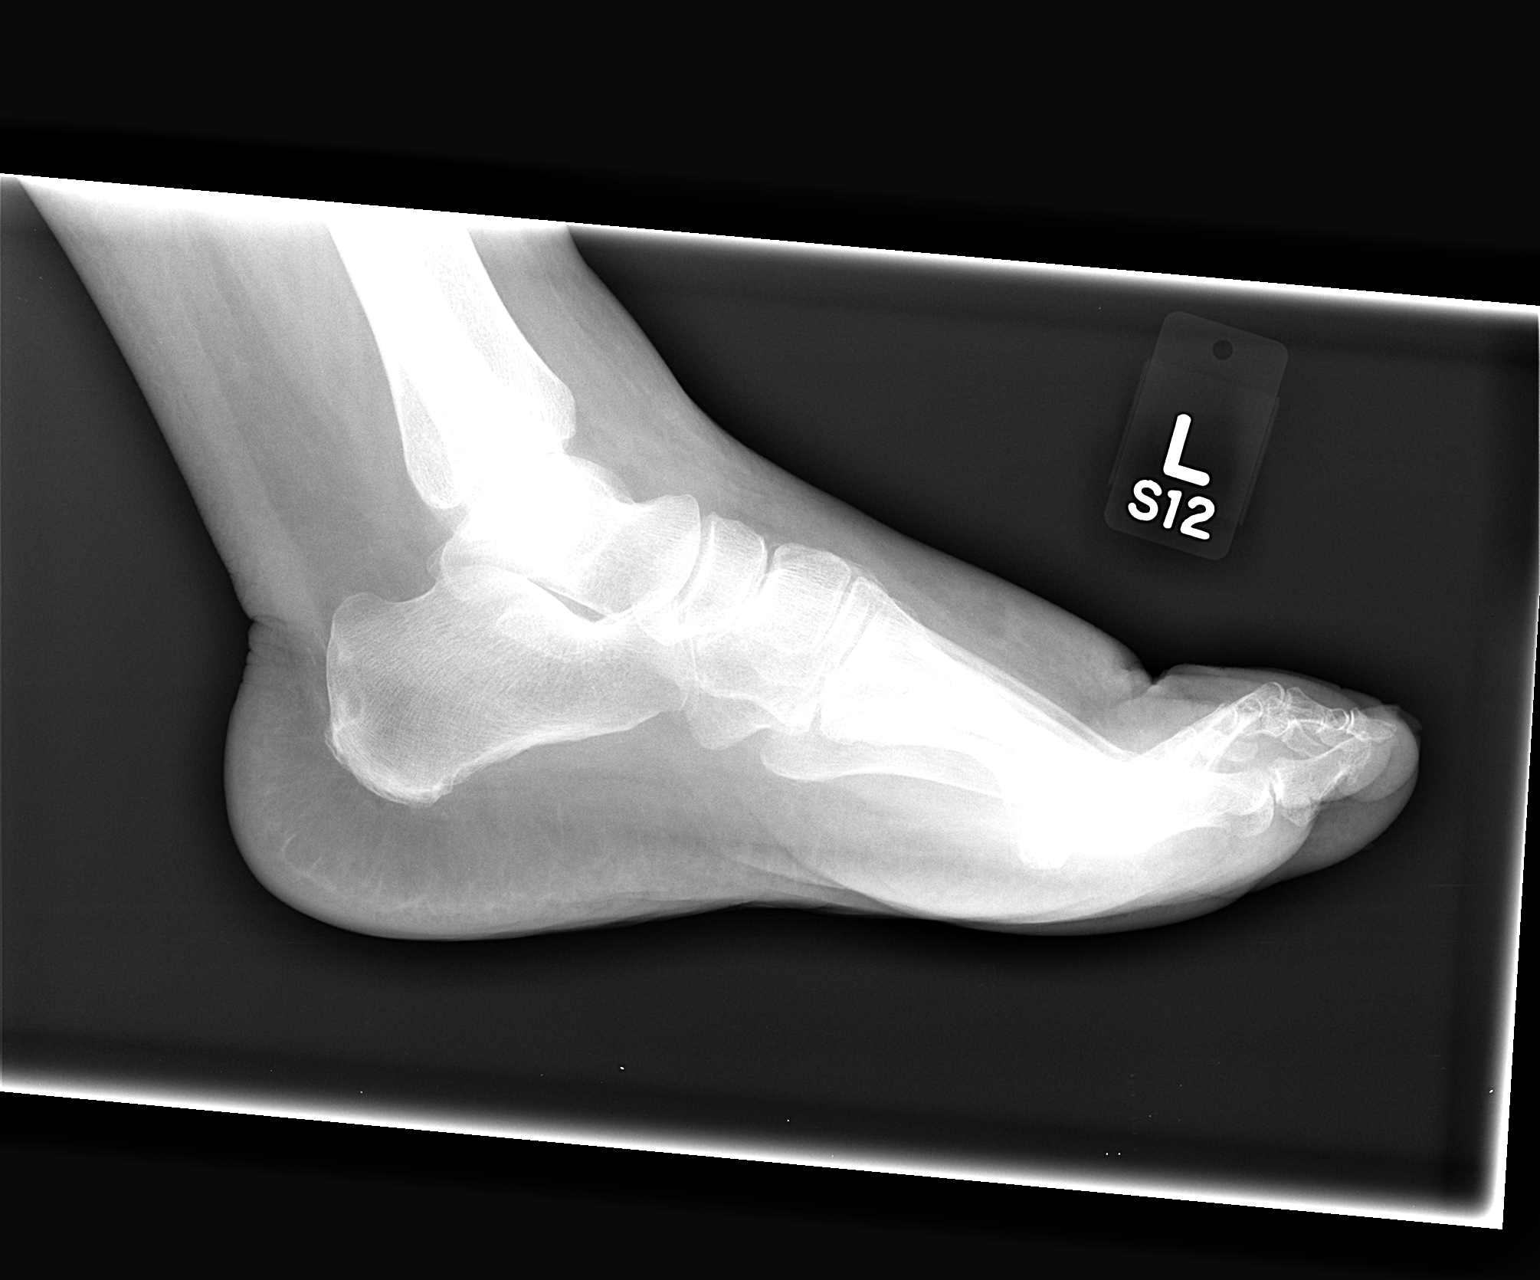

[3 of 3 positions shown; findings below may reference images not displayed]

FINDINGS: There is a nondisplaced fracture involving the base of the proximal
phalanx of the small toe extending into the metatarsophalangeal
joint. Diffuse soft tissue swelling. Moderate osteopenia.
IMPRESSION: Nondisplaced fracture involving the base of the proximal phalanx of
the small toe as described.

## 2016-05-26 ENCOUNTER — Encounter: Payer: Self-pay | Admitting: Internal Medicine

## 2016-05-26 ENCOUNTER — Ambulatory Visit (INDEPENDENT_AMBULATORY_CARE_PROVIDER_SITE_OTHER): Payer: Commercial Managed Care - HMO | Admitting: Internal Medicine

## 2016-05-26 VITALS — BP 138/88 | HR 82 | Ht 62.0 in | Wt 190.8 lb

## 2016-05-26 DIAGNOSIS — I495 Sick sinus syndrome: Secondary | ICD-10-CM

## 2016-05-26 NOTE — Progress Notes (Signed)
HPI [name] returns today for pacemaker followup. She is a 75 year old woman with chronic diastolic heart failure, coronary artery disease, status post bypass surgery, COPD on nighttime home oxygen, symptomatic bradycardia, status post permanent pacemaker insertion. She also has obesity, and chronic arthritis. She denies syncope or near-syncope. No chest pain. She has undergone hip surgery but still has pain. She denies palpitations or worsening sob.  She has occaisional falls but has not injured herself. Allergies  Allergen Reactions  . Codeine     "good" headache  . Other Nausea And Vomiting    Leafy raw vegetables and seeds  . Oxycodone Hcl     "good" headache; also OxyContin  . Peanuts [Peanut Oil] Nausea And Vomiting  . Reglan [Metoclopramide]     CONFUSION     Current Outpatient Prescriptions  Medication Sig Dispense Refill  . acetaminophen (TYLENOL) 325 MG tablet Take 650 mg by mouth as directed.    Marland Kitchen. apixaban (ELIQUIS) 5 MG TABS tablet Take 5 mg by mouth 2 (two) times daily.     Marland Kitchen. aspirin 81 MG tablet Take 81 mg by mouth daily.    Marland Kitchen. atorvastatin (LIPITOR) 40 MG tablet Take 1 tablet (40 mg total) by mouth daily. 30 tablet 0  . benazepril (LOTENSIN) 40 MG tablet Take 40 mg by mouth every morning.    . dicyclomine (BENTYL) 20 MG tablet Take one every 6 hours as needed for abd cramps 20 tablet 0  . doxepin (SINEQUAN) 10 MG capsule Take 10 mg by mouth at bedtime.    . ferrous sulfate 325 (65 FE) MG tablet Take 325 mg by mouth daily with breakfast.    . ketorolac (ACULAR) 0.5 % ophthalmic solution Place 1 drop into the right eye 3 (three) times daily.    Marland Kitchen. levETIRAcetam (KEPPRA) 500 MG tablet Take 500 mg by mouth 2 (two) times daily.    Marland Kitchen. lubiprostone (AMITIZA) 24 MCG capsule Take 24 mcg by mouth 2 (two) times daily with a meal.    . metFORMIN (GLUCOPHAGE) 500 MG tablet Take 500 mg by mouth 2 (two) times daily with a meal.     . metoprolol tartrate (LOPRESSOR) 25 MG tablet Take 25  mg by mouth 2 (two) times daily.     . nitroGLYCERIN (NITROSTAT) 0.4 MG SL tablet Place 1 tablet (0.4 mg total) under the tongue every 5 (five) minutes as needed for chest pain. 25 tablet 1  . omega-3 acid ethyl esters (LOVAZA) 1 G capsule Take 2 g by mouth 2 (two) times daily.     . ondansetron (ZOFRAN) 4 MG tablet 1 po q6h prn nausea (Patient taking differently: Take 4 mg by mouth every 6 (six) hours as needed for nausea or vomiting. ) 6 tablet 0  . oxybutynin (DITROPAN-XL) 10 MG 24 hr tablet Take 10 mg by mouth every morning.     . pantoprazole (PROTONIX) 40 MG tablet Take 1 tablet (40 mg total) by mouth daily. 30 tablet 3  . potassium chloride (K-DUR,KLOR-CON) 10 MEQ tablet Take 10 mEq by mouth daily.     No current facility-administered medications for this visit.     Past Medical History  Diagnosis Date  . Hypertension   . CAD (coronary artery disease)     multivessel, CABG 6/10.  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  Myoview (11/20/11) showed no ischemia & EF 62%  . Hyperlipidemia   . Deep vein thrombosis, upper right extremity (HCC)     post-PPM  . Obesity   .  Dehiscence of closure of sternum or sternotomy 07/2009    sternal infection; required flap closure  . COPD (chronic obstructive pulmonary disease) (HCC)     severe lung disease by PFTs 6/12  . Seizure disorder (HCC)   . 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 PUD (peptic ulcer disease)   . Pacemaker   . Sleep apnea     denies CPAP use on 09/30/2013  . Diabetes mellitus, type II (HCC)   . H/O hiatal hernia   . Migraines     "years ago" (09/30/2013)  . DDD (degenerative disc disease)   . Arthritis     "legs and back" (09/30/2013)  . Chronic lower back pain   . Urge incontinence of urine   . Myocardial infarction (HCC)     years ago  . Anginal pain (HCC)     "all the time, but not heart related"  . Asthma     years ago  . Orthopnea     sometimes  . Stroke [facility])     years ago,  "residual on left back"  . History of kidney stones   . Gastroesophageal reflux disease     not much  . Seizures (HCC)     "long time ago; don't remember what they were related to" (09/30/2013)none recent  . Anemia   . Blurred vision, bilateral     sometimes double vision    ROS:   All systems reviewed and negative except as noted in the HPI.   Past Surgical History  Procedure Laterality Date  . Pacemaker placement  1993    Status post DDD pacemaker implantation in 1993, with Medtronic Kappa generator change in 2003 for  second-degree AV block, most recent gen change by Fawn KirkJA 04/14/11  . Total hip arthroplasty Right   . Replacement total knee Bilateral   . Thyroidectomy    . Wrist surgery Left     "tumor taken off" (09/30/2013)  . Coronary artery bypass graft  05/1999    - LIMA to LAD, SVG to OM, SVG to PDA  . Reconstructive repair sternal      for infection s/p CABG 8/10  . [facility]      [facility]  biopsy, and disruption Schatzki's ring  . Colonoscopy  04/27/2005    hemorrhoids, diverticula  . Esophagogastroduodenoscopy  09/10/2012    RMR: Noncritical Schatzki's ring. Diffuse gastric erosions and an  area of partially healing ulceration-status post biopsy/ Small hiatal hernia. Bx reactive gastropathy.  . Colonoscopy with esophagogastroduodenoscopy (egd) N/A 02/20/2013    RMR: pancolonic diverticulosis and internal hemorrhoids. EGD with small hiatal hernia, healed gastric ulcer  . Portacath placement Left 07/17/2013    Procedure: INSERTION PORT-A-CATH-left subclavian;  Surgeon: Fabio BeringBrent C Ziegler, MD;  Location: [facility];  Service: General;  Laterality: Left;  . Appendectomy    . Cholecystectomy    . Insert / replace / remove pacemaker  1993  . Lumbar disc surgery    . Posterior lumbar fusion    . Tubal ligation    . Cesarean section  ~1980  . Abdominal hysterectomy      partial  . Back surgery      x3 total  . Total hip arthroplasty Left 12/03/2013     Procedure: LEFT TOTAL HIP ARTHROPLASTY ANTERIOR APPROACH;  Surgeon: Shelda PalMatthew D Olin, MD;  Location: [facility];  Service: Orthopedics;  Laterality: Left;  . Joint replacement  Family History  Problem Relation Age of Onset  . Diabetes    . Heart disease Mother     MI  . Coronary artery disease      Female <55  . Cancer    . Colon cancer Father     age 5     Social History   Social History  . Marital Status: Married    Spouse Name: N/A  . Number of Children: 5  . Years of Education: N/A   Occupational History  .     Social History Main Topics  . Smoking status: Never Smoker   . Smokeless tobacco: Former Neurosurgeon    Types: Snuff     Comment: 09/30/2013 "stopped snuff a long time ago"  . Alcohol Use: No  . Drug Use: No  . Sexual Activity: No   Other Topics Concern  . Not on file   Social History Narrative   Lives at home with daughter, son-in-law   Caffeine use-  none     BP 138/88 mmHg  Pulse 82  Ht  (1.575 m)  Wt 190 lb 12.8 oz (86.546 kg)  BMI 34.89 kg/m2  Physical Exam:  Chronically ill appearing 75 year old woman, NAD HEENT: Unremarkable Neck:  7 cm JVD, no thyromegally Back:  Musculoskeletal tenderness is present in the lower back region. Lungs:  Clear except for rales in the bases bilaterally. No wheezes or rhonchi. No increased work of breathing. HEART:  Regular rate rhythm, no murmurs, no rubs, no clicks Abd:  soft, positive bowel sounds, no organomegally, no rebound, no guarding Ext:  2 plus pulses, no edema, no cyanosis, no clubbing Skin:  No rashes no nodules Neuro:  CN II through XII intact, motor grossly intact  DEVICE  Normal device function.  See PaceArt for details.   Assess/Plan:  1. Atrial fib - she appears to now be chronically in atrial fib. She will continue her current meds. Her rate is reasonably well controlled. I am concerned about her falls but she has not injured herself. 2. HTN - her blood pressure is reasonably well  controlled. No change in meds. 3. PPM - her Medtronic DDD PM is working normally. Will follow.  Leonia Reeves.D.

## 2016-05-26 NOTE — Patient Instructions (Signed)
Medication Instructions:  Your physician recommends that you continue on your current medications as directed. Please refer to the Current Medication list given to you today.  Labwork: None ordered  Testing/Procedures: None ordered  Follow-Up: Your physician wants you to follow-up in: 6 months with device clinic.  You will receive a reminder letter in the mail two months in advance. If you don't receive a letter, please call our office to schedule the follow-up appointment.  Your physician wants you to follow-up in: 1 year with Dr. Taylor.  You will receive a reminder letter in the mail two months in advance. If you don't receive a letter, please call our office to schedule the follow-up appointment.  If you need a refill on your cardiac medications before your next appointment, please call your pharmacy.  Thank you for choosing [facility]!!        

## 2016-06-09 LAB — CUP PACEART INCLINIC DEVICE CHECK
Brady Statistic AP VP Percent: 5 %
Brady Statistic AP VS Percent: 2 %
Brady Statistic AS VP Percent: 2 %
Implantable Lead Implant Date: 19930812
Implantable Lead Location: 753860
Implantable Lead Model: 501154
Lead Channel Impedance Value: 616 Ohm
Lead Channel Pacing Threshold Amplitude: 1.25 V
Lead Channel Pacing Threshold Pulse Width: 0.4 ms
Lead Channel Sensing Intrinsic Amplitude: 0.18 mV
Lead Channel Sensing Intrinsic Amplitude: 22.4 mV
MDC IDC LEAD IMPLANT DT: 19930812
MDC IDC LEAD LOCATION: 753859
MDC IDC MSMT BATTERY IMPEDANCE: 331 Ohm
MDC IDC MSMT BATTERY REMAINING LONGEVITY: 102 mo
MDC IDC MSMT BATTERY VOLTAGE: 2.78 V
MDC IDC MSMT LEADCHNL RA IMPEDANCE VALUE: 599 Ohm
MDC IDC SESS DTM: 20170622121528
MDC IDC SET LEADCHNL RA PACING AMPLITUDE: 2.25 V
MDC IDC SET LEADCHNL RV PACING AMPLITUDE: 3 V
MDC IDC SET LEADCHNL RV PACING PULSEWIDTH: 0.4 ms
MDC IDC SET LEADCHNL RV SENSING SENSITIVITY: 5.6 mV
MDC IDC STAT BRADY AS VS PERCENT: 92 %

## 2016-06-21 IMAGING — DX DG FOOT COMPLETE 3+V*L*
3 series · 3 of 3 positions shown · non-contrast
Comparison: 11/04/2014.

CLINICAL DATA: Left foot pain after a fall.

EXAM:
LEFT FOOT - COMPLETE 3+ VIEW

[foot ap]
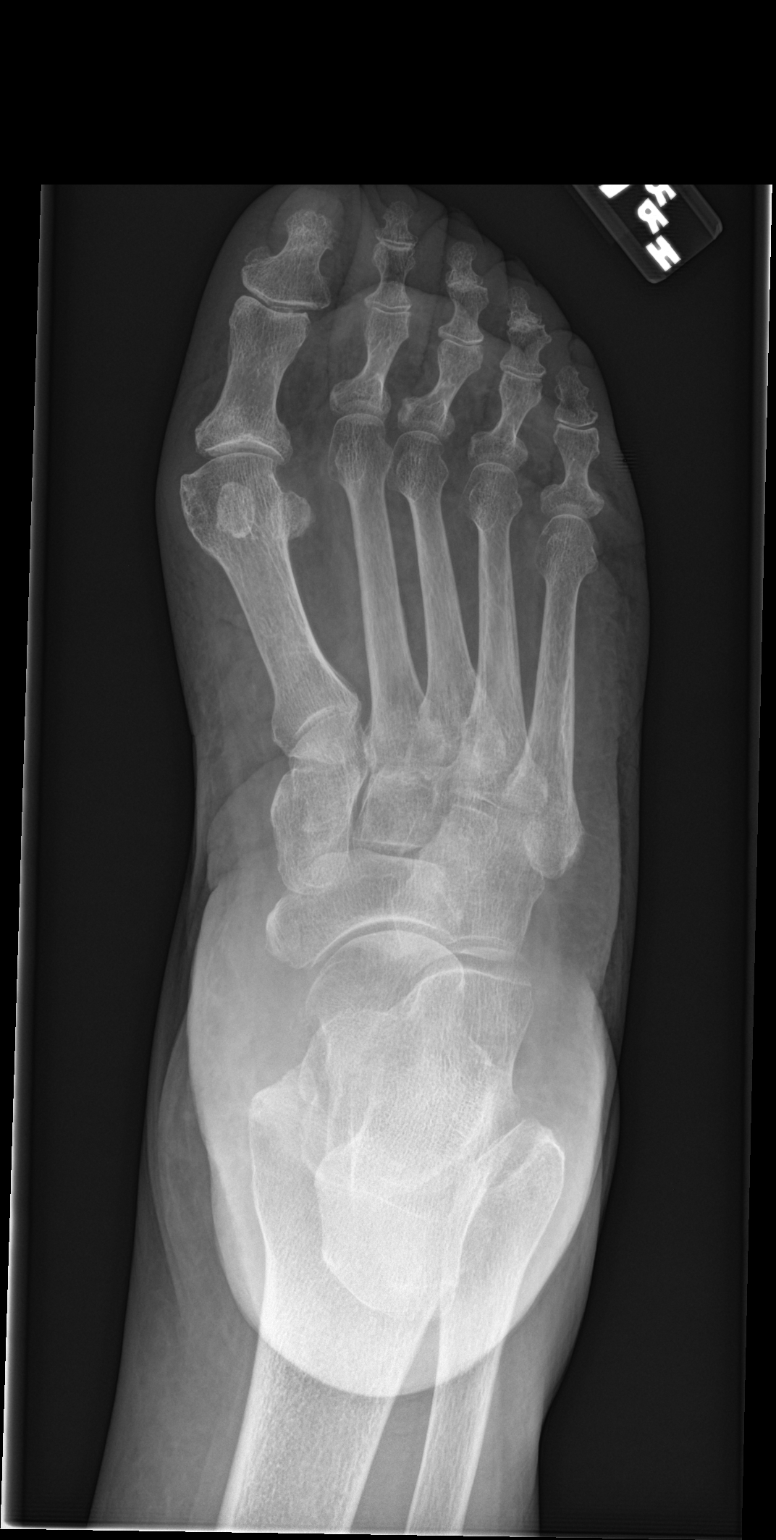

[foot obl]
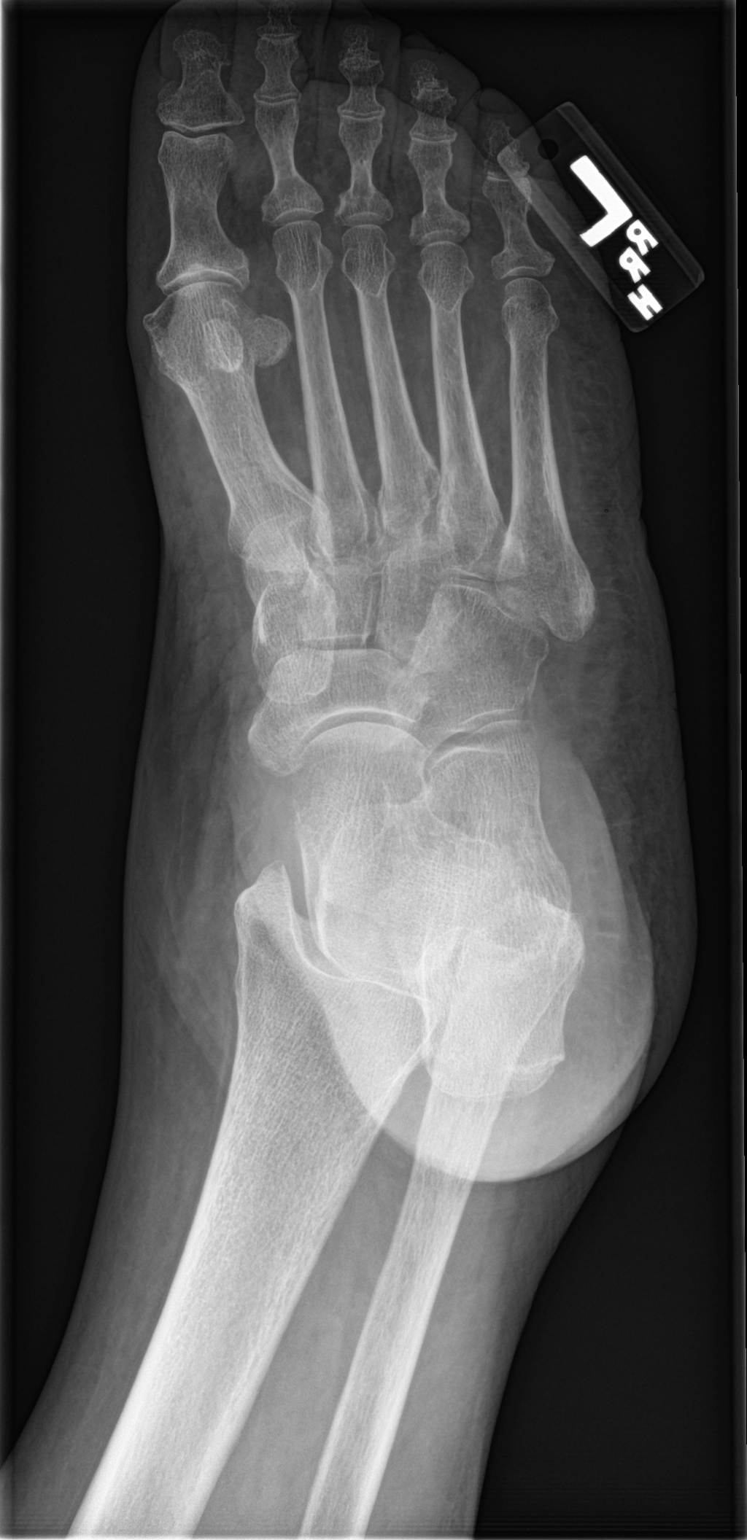

[foot lat]
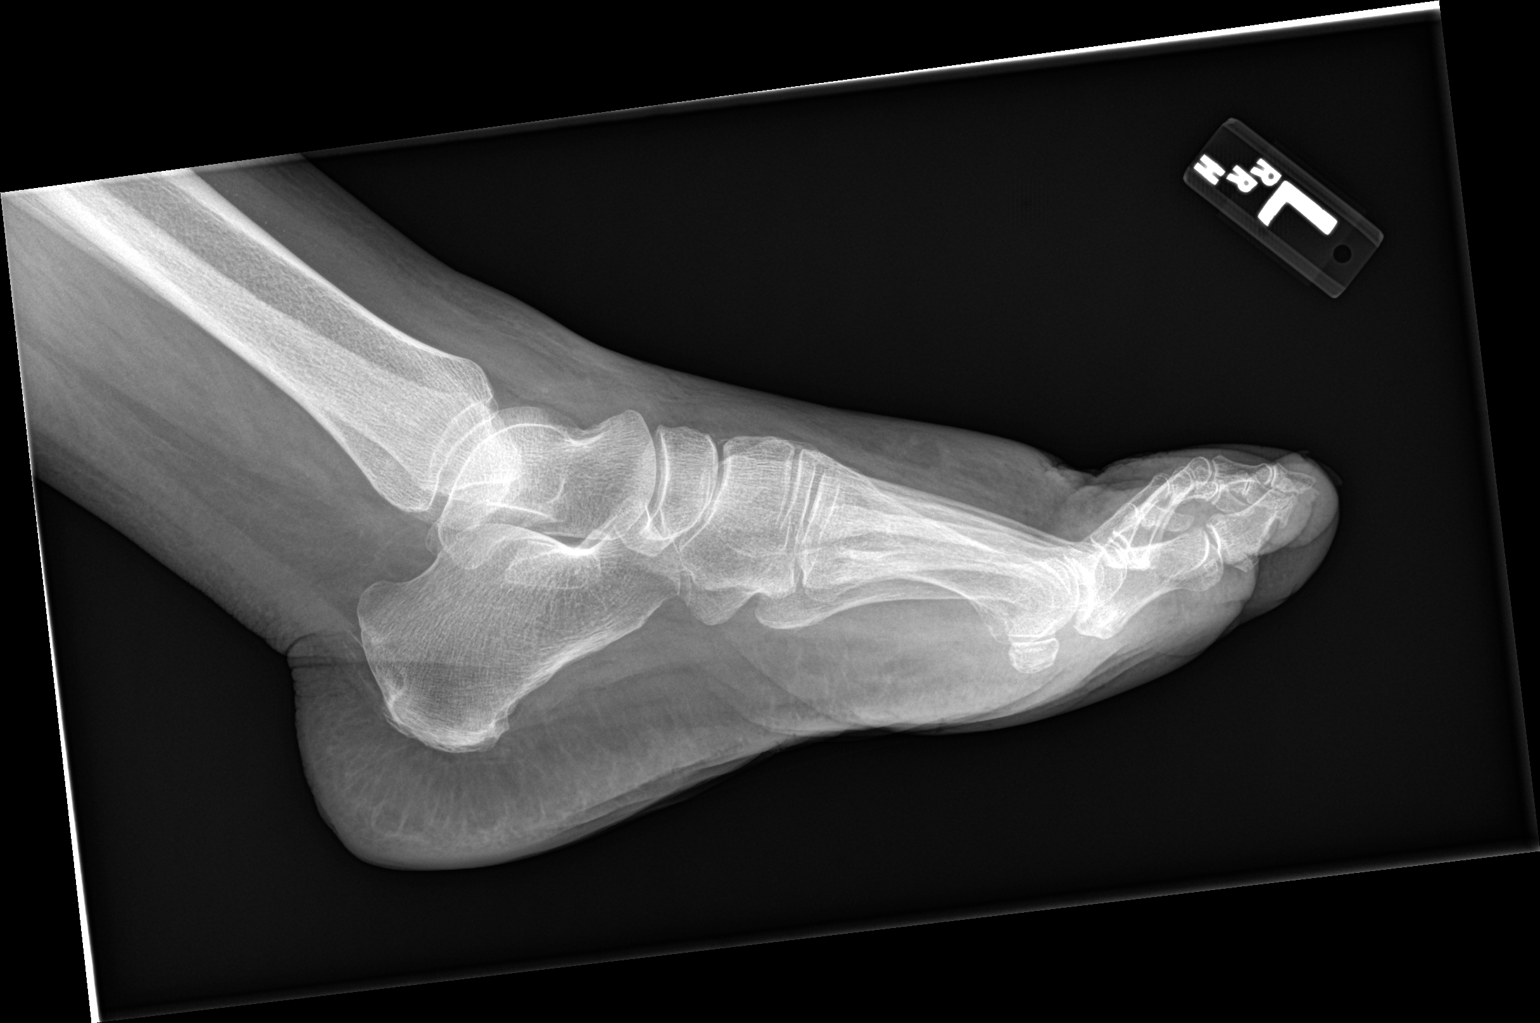

[3 of 3 positions shown; findings below may reference images not displayed]

FINDINGS: No acute osseous or joint abnormality. Hallux valgus and mild
degenerative change at the first metatarsophalangeal joint. Forefoot
soft tissue swelling.
IMPRESSION: 1. Forefoot soft tissue swelling without acute osseous abnormality.
2. Hallux valgus and mild first metatarsophalangeal joint
osteoarthritis.

## 2016-06-21 IMAGING — DX DG HIP (WITH OR WITHOUT PELVIS) 2-3V*L*
3 series · 3 of 3 positions shown · non-contrast
Comparison: CT abdomen/ pelvis 10/09/2014.  Plain film 12/03/2013

CLINICAL DATA: 73-year-old female with a history of left hip pain
and left foot pain after falling.

EXAM:
DG HIP W/ PELVIS 2-3V*L*

[pelvis ap]
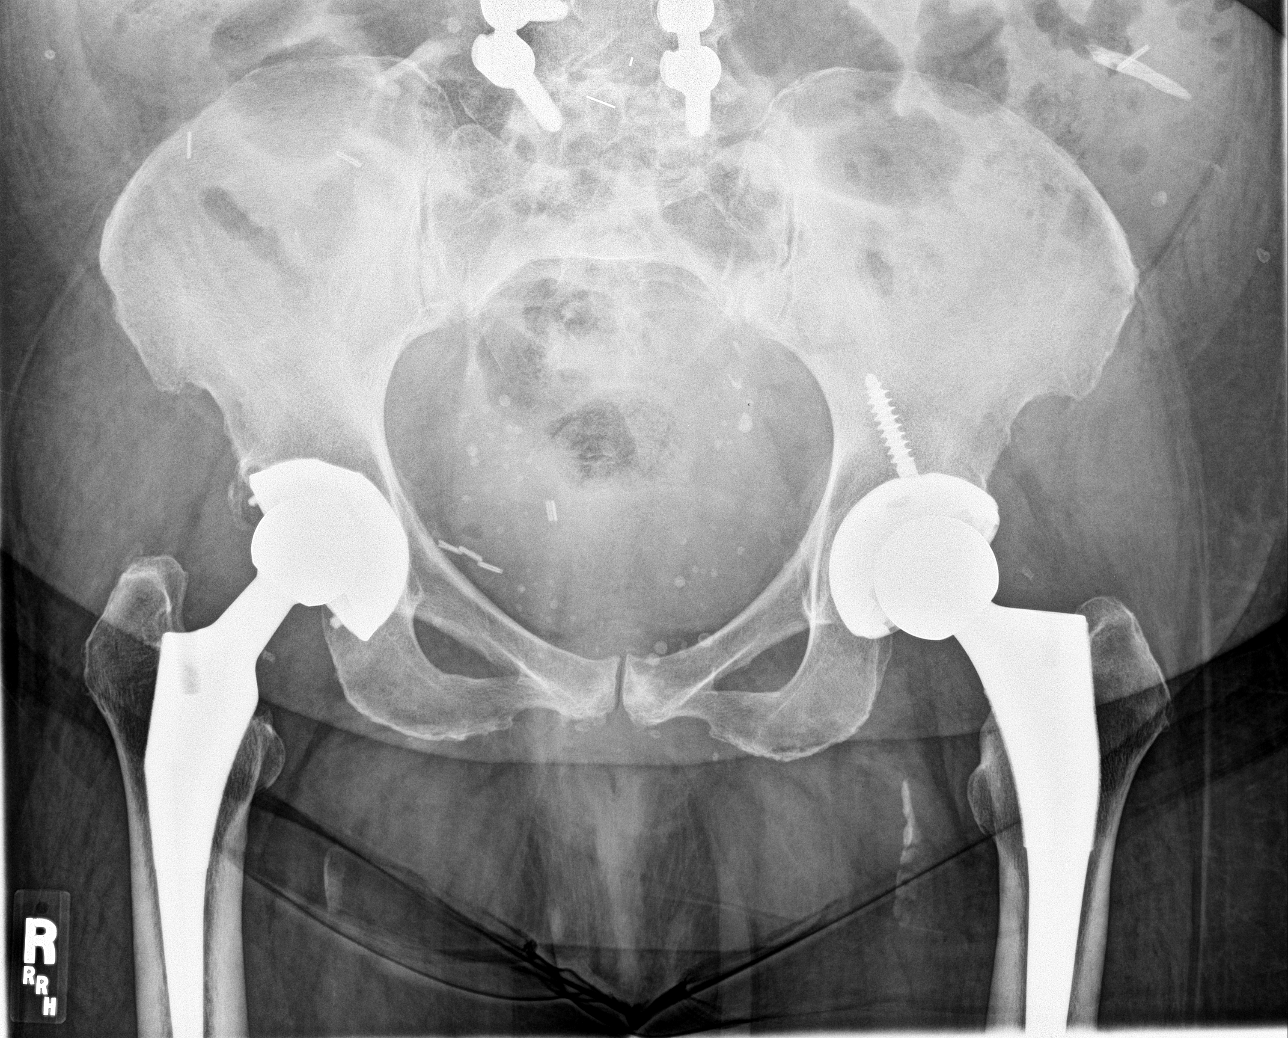

[hip ap]
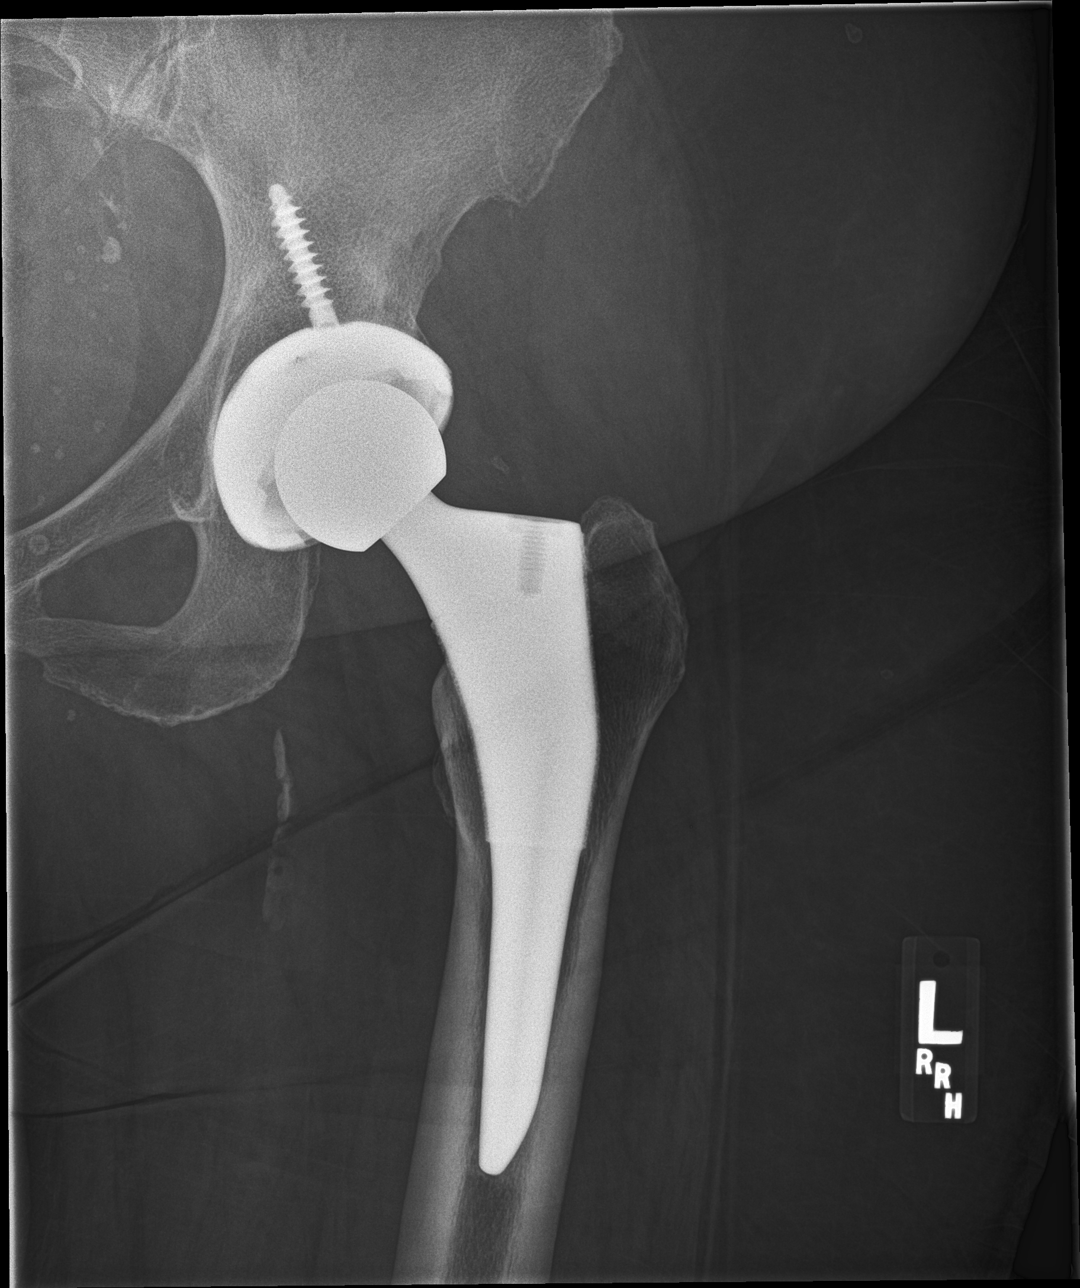

[hip frog leg]
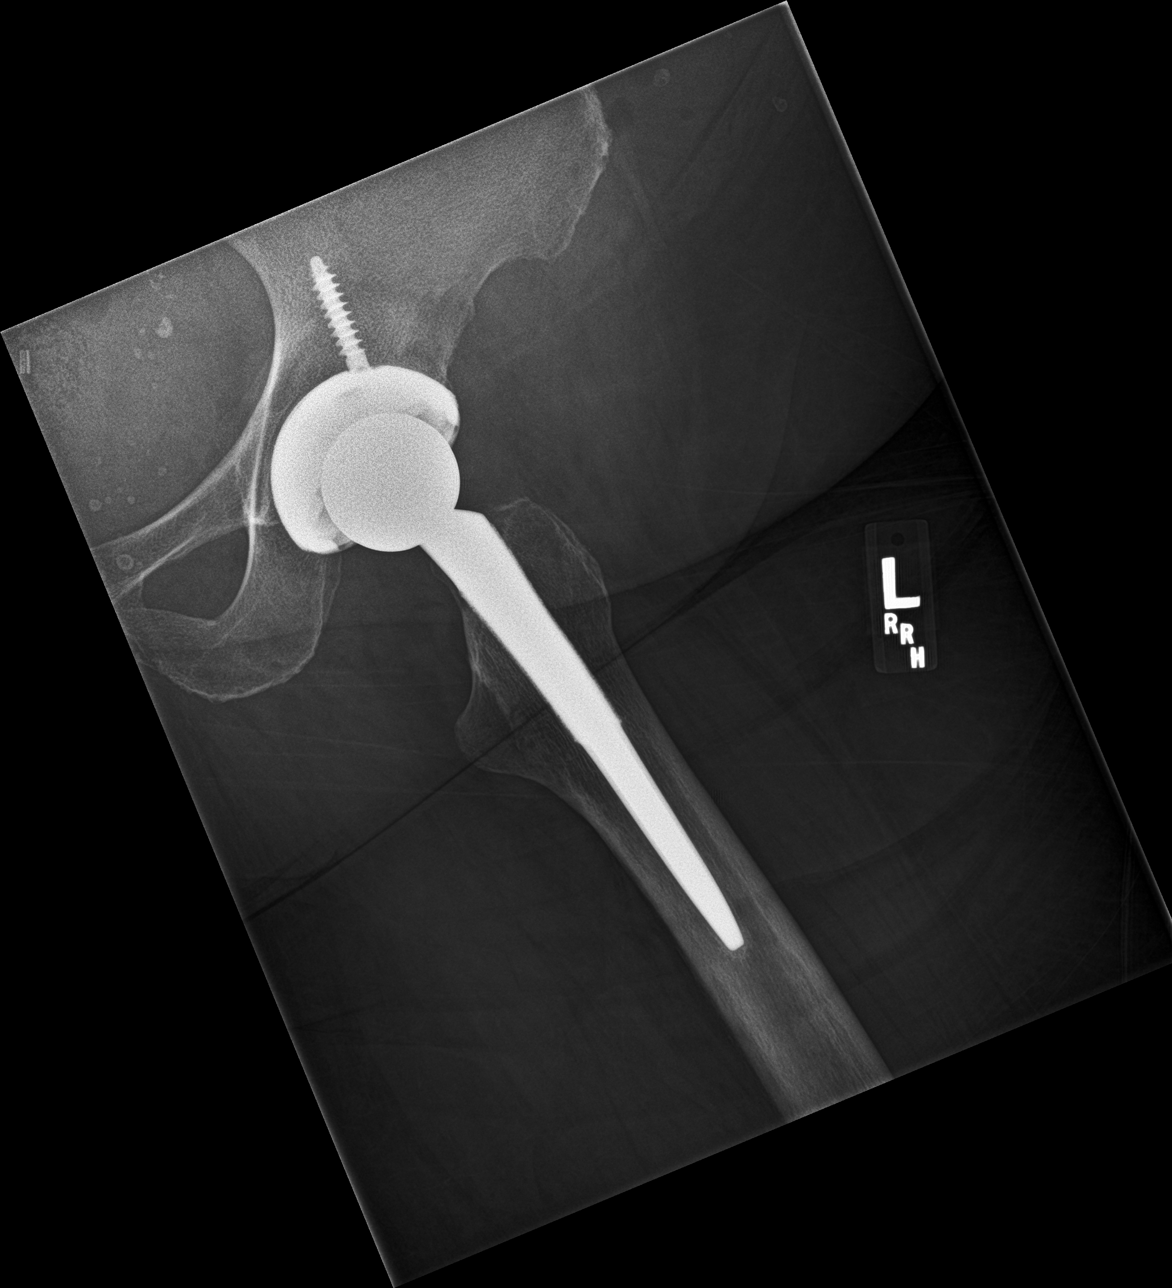

[3 of 3 positions shown; findings below may reference images not displayed]

FINDINGS: No acute bony abnormality.  The bony pelvic ring appears intact.

Bilateral total hip arthroplasty.

No significant lucency around the margins of the left or right hip
hardware.

Partially visualized surgical changes of the lumbar spine.

Multiple surgical clips within the abdomen and anatomic pelvis.

Vascular calcifications are present.

Components of the left hip arthroplasty appear congruent.
IMPRESSION: No acute fracture or malalignment of the hip arthroplasty
components.

Changes of atherosclerosis.

## 2016-06-22 DIAGNOSIS — I129 Hypertensive chronic kidney disease with stage 1 through stage 4 chronic kidney disease, or unspecified chronic kidney disease: Secondary | ICD-10-CM | POA: Diagnosis not present

## 2016-06-22 DIAGNOSIS — N183 Chronic kidney disease, stage 3 (moderate): Secondary | ICD-10-CM | POA: Diagnosis not present

## 2016-06-22 DIAGNOSIS — E1121 Type 2 diabetes mellitus with diabetic nephropathy: Secondary | ICD-10-CM | POA: Diagnosis not present

## 2016-06-22 DIAGNOSIS — I25119 Atherosclerotic heart disease of native coronary artery with unspecified angina pectoris: Secondary | ICD-10-CM | POA: Diagnosis not present

## 2016-07-12 DIAGNOSIS — E119 Type 2 diabetes mellitus without complications: Secondary | ICD-10-CM | POA: Diagnosis not present

## 2016-07-12 DIAGNOSIS — H524 Presbyopia: Secondary | ICD-10-CM | POA: Diagnosis not present

## 2016-07-12 DIAGNOSIS — H40003 Preglaucoma, unspecified, bilateral: Secondary | ICD-10-CM | POA: Diagnosis not present

## 2016-07-12 DIAGNOSIS — H04123 Dry eye syndrome of bilateral lacrimal glands: Secondary | ICD-10-CM | POA: Diagnosis not present

## 2016-07-31 IMAGING — CT CT ABD-PELV W/O CM
2 of 4 series · 16 of 46 positions shown, 18 images · non-contrast
Comparison: CT of the abdomen and pelvis from 10/09/2014

CLINICAL DATA: Generalized abdominal pain and nausea, acute onset.
Initial encounter.

EXAM:
CT ABDOMEN AND PELVIS WITHOUT CONTRAST
TECHNIQUE: Multidetector CT imaging of the abdomen and pelvis was performed
following the standard protocol without IV contrast.

[Series 2: abdomen/pelvis w/o contrast · axial · non-contrast · 0.77mm/px · z∈[-444,-84]mm · 13 of 80 slices shown, 15 images]
[im 4/80  soft-tissue]
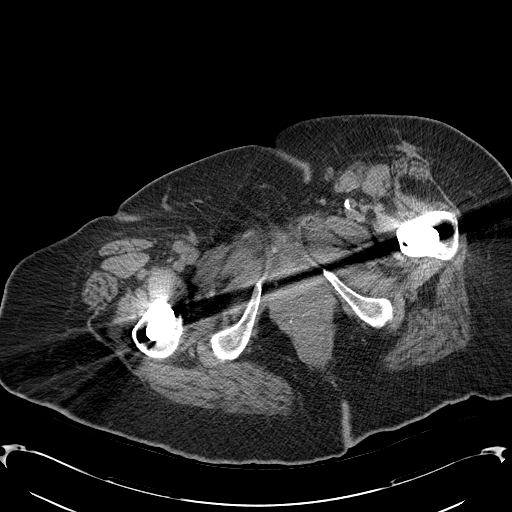
[im 4/80  bone]
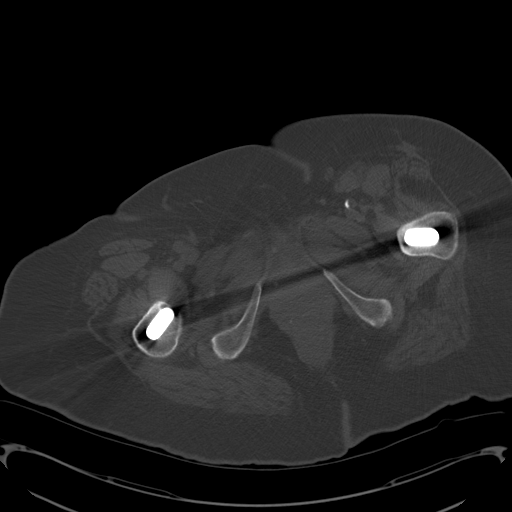
[im 10/80  soft-tissue]
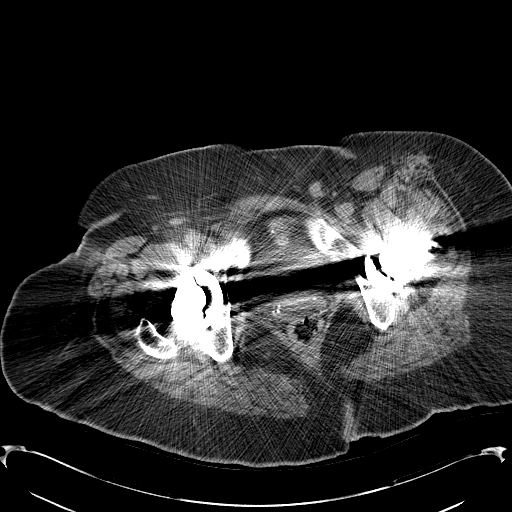
[im 16/80  soft-tissue]
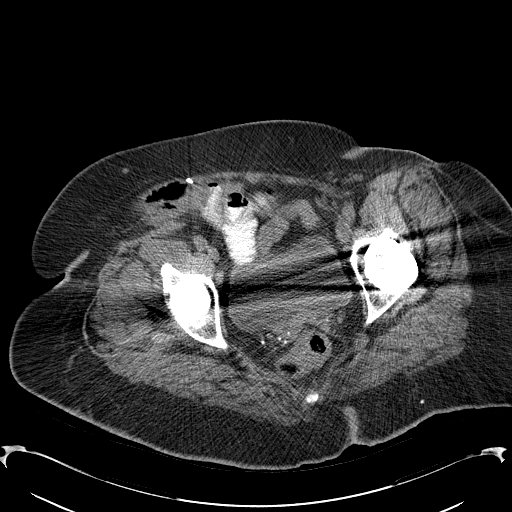
[im 23/80  soft-tissue]
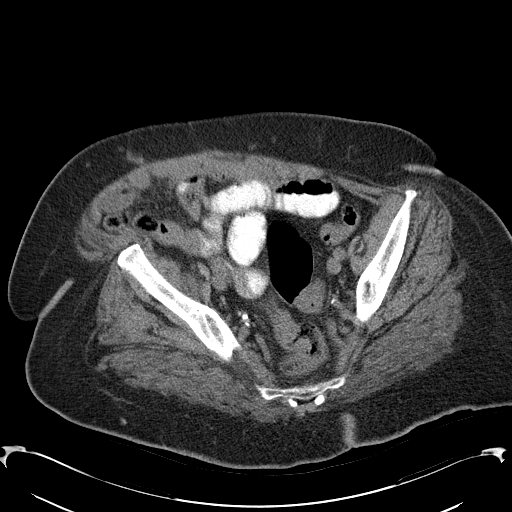
[im 29/80  soft-tissue]
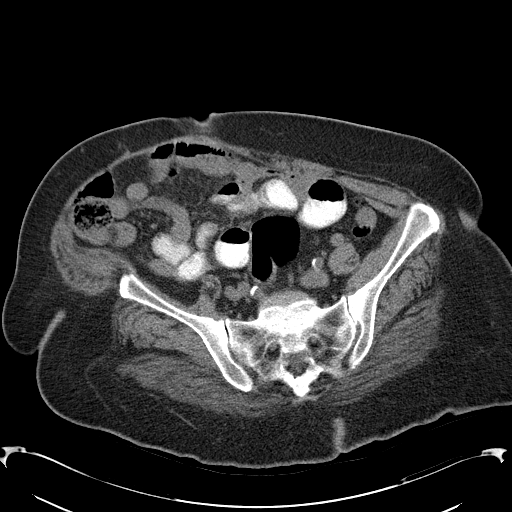
[im 35/80  soft-tissue]
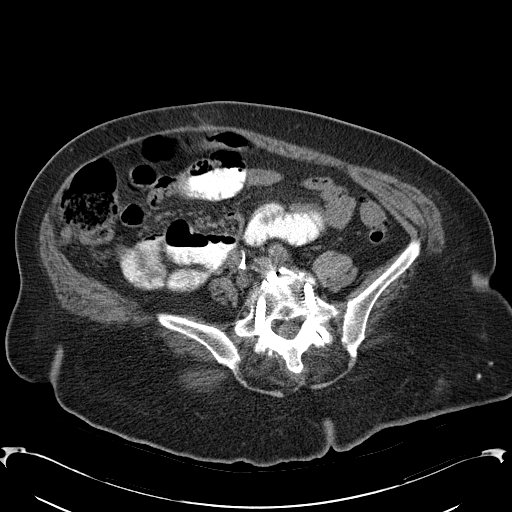
[im 42/80  soft-tissue]
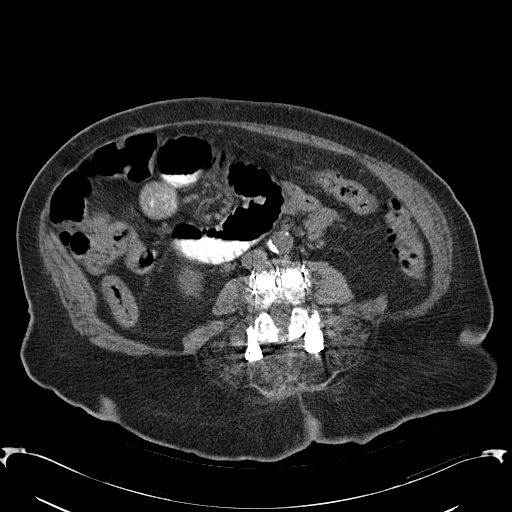
[im 45/80  soft-tissue]
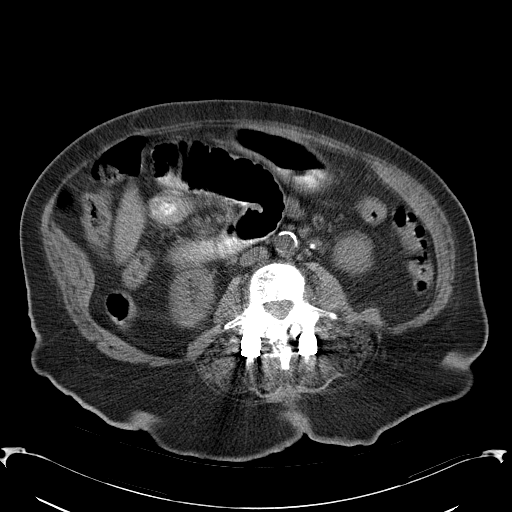
[im 51/80  soft-tissue]
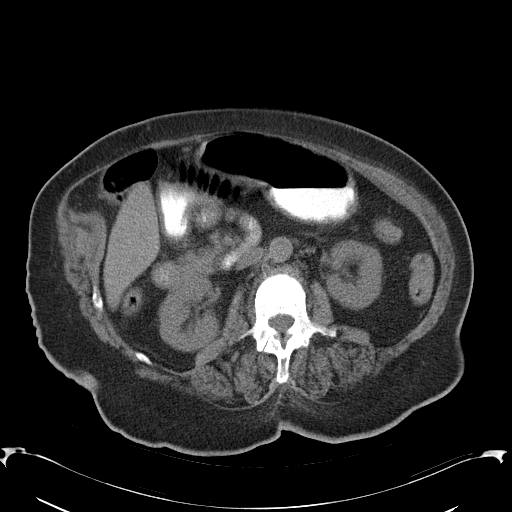
[im 51/80  bone]
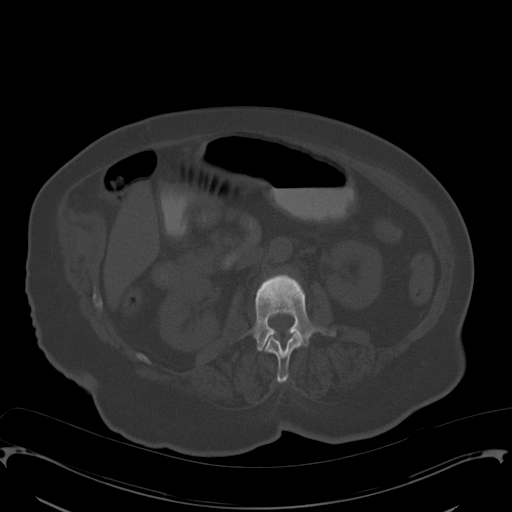
[im 57/80  soft-tissue]
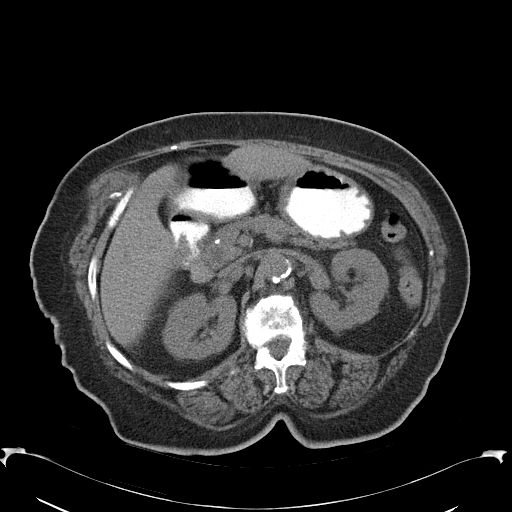
[im 64/80  soft-tissue]
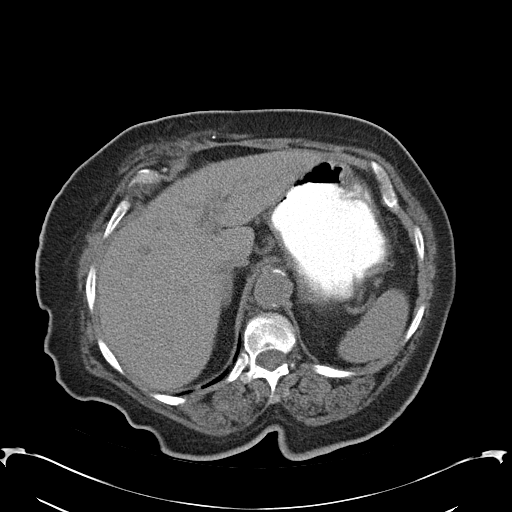
[im 70/80  soft-tissue]
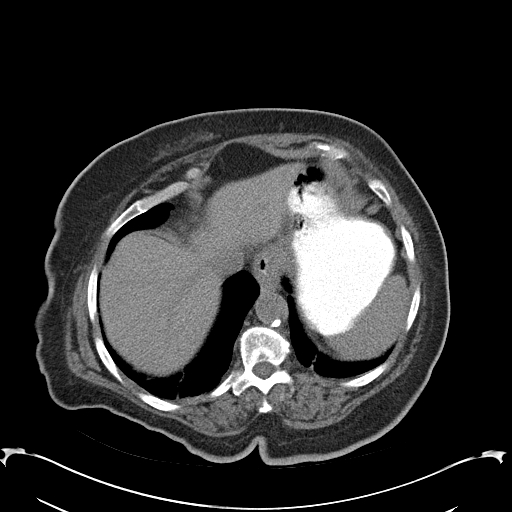
[im 76/80  soft-tissue]
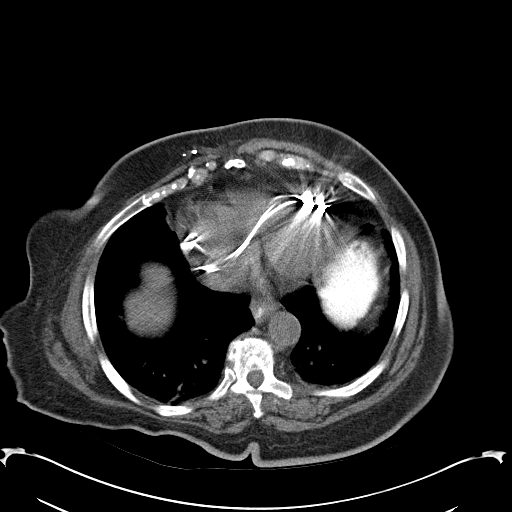

[Series 4: mpr cor 3.0mm · coronal · 0.71mm/px · 3 of 101 slices shown]
[im 34/101  soft-tissue]
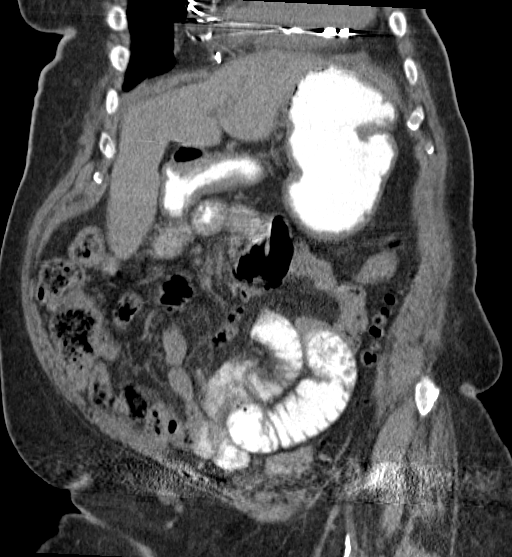
[im 45/101  soft-tissue]
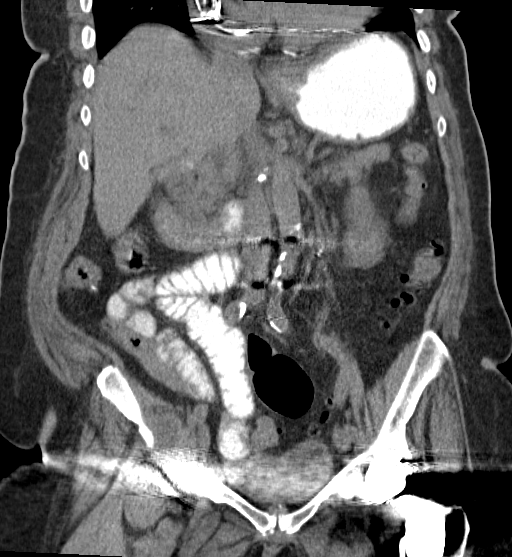
[im 56/101  soft-tissue]
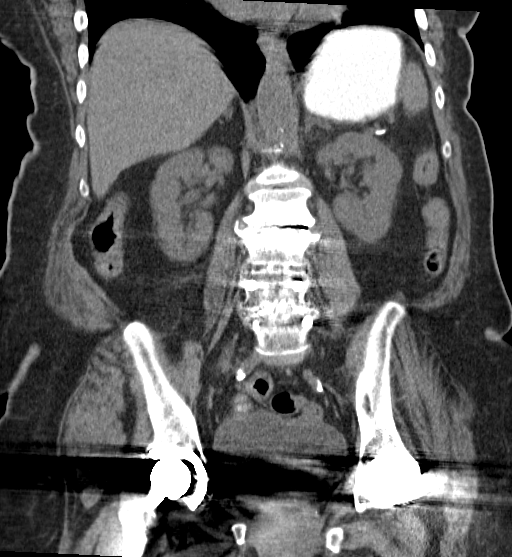

[16 of 46 positions shown; findings below may reference images not displayed]

FINDINGS: Mild bibasilar atelectasis is noted. Diffuse coronary artery
calcifications are seen. Pacemaker leads are noted.

The liver and spleen are unremarkable in appearance. The patient is
status post cholecystectomy. The pancreas and adrenal glands are
unremarkable.

The kidneys are unremarkable in appearance. There is no evidence of
hydronephrosis. No renal or ureteral stones are seen. No perinephric
stranding is appreciated.

No free fluid is identified. The small bowel is unremarkable in
appearance. The stomach is within normal limits. No acute vascular
abnormalities are seen. Scattered calcification is seen along the
abdominal aorta and its branches, including along the proximal right
renal artery.

A small umbilical hernia is seen, containing only fat.

The patient is status post appendectomy. Scattered diverticulosis is
noted along the descending and proximal sigmoid colon, without
evidence of diverticulitis. The colon is otherwise unremarkable in
appearance.

The bladder is mildly distended and grossly unremarkable, though
difficult to fully assess due to metal artifact. The patient is
status post hysterectomy. No suspicious adnexal masses are seen. No
inguinal lymphadenopathy is seen.

No acute osseous abnormalities are identified. Bilateral hip
prostheses are grossly unremarkable in appearance, though
incompletely imaged. The patient is status post lumbar spinal fusion
at L3-L5, with underlying chronic degenerative change and endplate
sclerosis, and associated decompression.
IMPRESSION: 1. No acute abnormality seen to explain the patient's symptoms.
2. Diffuse coronary artery calcifications seen.
3. Scattered calcification along the abdominal aorta and its
branches, including along the proximal right renal artery.
4. Small umbilical hernia, containing only fat.
5. Mild bibasilar atelectasis noted.
6. Scattered diverticulosis along the descending and proximal
sigmoid colon, without evidence of diverticulitis.
7. Status post lumbar spinal fusion at L3-L5, with underlying mild
degenerative change.

## 2016-08-03 DIAGNOSIS — E1121 Type 2 diabetes mellitus with diabetic nephropathy: Secondary | ICD-10-CM | POA: Diagnosis not present

## 2016-08-03 DIAGNOSIS — M545 Low back pain: Secondary | ICD-10-CM | POA: Diagnosis not present

## 2016-08-03 DIAGNOSIS — N183 Chronic kidney disease, stage 3 (moderate): Secondary | ICD-10-CM | POA: Diagnosis not present

## 2016-08-03 DIAGNOSIS — I110 Hypertensive heart disease with heart failure: Secondary | ICD-10-CM | POA: Diagnosis not present

## 2016-08-09 ENCOUNTER — Emergency Department (HOSPITAL_COMMUNITY)
Admission: EM | Admit: 2016-08-09 | Discharge: 2016-08-09 | Disposition: A | Discharge status: Home / Self Care | Payer: Commercial Managed Care - HMO | Attending: Emergency Medicine | Admitting: Emergency Medicine

## 2016-08-09 ENCOUNTER — Encounter (HOSPITAL_COMMUNITY): Payer: Self-pay | Admitting: *Deleted

## 2016-08-09 ENCOUNTER — Emergency Department (HOSPITAL_COMMUNITY): Payer: Commercial Managed Care - HMO

## 2016-08-09 DIAGNOSIS — Z7982 Long term (current) use of aspirin: Secondary | ICD-10-CM | POA: Diagnosis not present

## 2016-08-09 DIAGNOSIS — Z7984 Long term (current) use of oral hypoglycemic drugs: Secondary | ICD-10-CM | POA: Insufficient documentation

## 2016-08-09 DIAGNOSIS — R109 Unspecified abdominal pain: Secondary | ICD-10-CM | POA: Diagnosis not present

## 2016-08-09 DIAGNOSIS — I10 Essential (primary) hypertension: Secondary | ICD-10-CM | POA: Insufficient documentation

## 2016-08-09 DIAGNOSIS — Z79899 Other long term (current) drug therapy: Secondary | ICD-10-CM | POA: Insufficient documentation

## 2016-08-09 DIAGNOSIS — J45909 Unspecified asthma, uncomplicated: Secondary | ICD-10-CM | POA: Diagnosis not present

## 2016-08-09 DIAGNOSIS — Z87891 Personal history of nicotine dependence: Secondary | ICD-10-CM | POA: Diagnosis not present

## 2016-08-09 DIAGNOSIS — E119 Type 2 diabetes mellitus without complications: Secondary | ICD-10-CM | POA: Insufficient documentation

## 2016-08-09 DIAGNOSIS — J449 Chronic obstructive pulmonary disease, unspecified: Secondary | ICD-10-CM | POA: Insufficient documentation

## 2016-08-09 DIAGNOSIS — I2510 Atherosclerotic heart disease of native coronary artery without angina pectoris: Secondary | ICD-10-CM | POA: Diagnosis not present

## 2016-08-09 DIAGNOSIS — I482 Chronic atrial fibrillation, unspecified: Secondary | ICD-10-CM

## 2016-08-09 DIAGNOSIS — R112 Nausea with vomiting, unspecified: Secondary | ICD-10-CM

## 2016-08-09 DIAGNOSIS — M549 Dorsalgia, unspecified: Secondary | ICD-10-CM | POA: Diagnosis not present

## 2016-08-09 DIAGNOSIS — R079 Chest pain, unspecified: Secondary | ICD-10-CM

## 2016-08-09 DIAGNOSIS — R55 Syncope and collapse: Secondary | ICD-10-CM | POA: Insufficient documentation

## 2016-08-09 DIAGNOSIS — N289 Disorder of kidney and ureter, unspecified: Secondary | ICD-10-CM

## 2016-08-09 DIAGNOSIS — R0789 Other chest pain: Secondary | ICD-10-CM | POA: Diagnosis not present

## 2016-08-09 LAB — URINALYSIS, ROUTINE W REFLEX MICROSCOPIC
Bilirubin Urine: NEGATIVE
GLUCOSE, UA: NEGATIVE mg/dL
Hgb urine dipstick: NEGATIVE
KETONES UR: NEGATIVE mg/dL
LEUKOCYTES UA: NEGATIVE
NITRITE: NEGATIVE
PH: 5.5 (ref 5.0–8.0)
PROTEIN: NEGATIVE mg/dL
Specific Gravity, Urine: 1.015 (ref 1.005–1.030)

## 2016-08-09 LAB — CBC
HEMATOCRIT: 33 % — AB (ref 36.0–46.0)
HEMOGLOBIN: 10.6 g/dL — AB (ref 12.0–15.0)
MCH: 26.1 pg (ref 26.0–34.0)
MCHC: 32.1 g/dL (ref 30.0–36.0)
MCV: 81.3 fL (ref 78.0–100.0)
Platelets: 235 10*3/uL (ref 150–400)
RBC: 4.06 MIL/uL (ref 3.87–5.11)
RDW: 16.6 % — ABNORMAL HIGH (ref 11.5–15.5)
WBC: 11.5 10*3/uL — ABNORMAL HIGH (ref 4.0–10.5)

## 2016-08-09 LAB — BASIC METABOLIC PANEL
ANION GAP: 12 (ref 5–15)
BUN: 42 mg/dL — ABNORMAL HIGH (ref 6–20)
CALCIUM: 9.8 mg/dL (ref 8.9–10.3)
CHLORIDE: 102 mmol/L (ref 101–111)
CO2: 22 mmol/L (ref 22–32)
Creatinine, Ser: 2.01 mg/dL — ABNORMAL HIGH (ref 0.44–1.00)
GFR calc non Af Amer: 23 mL/min — ABNORMAL LOW (ref >60)
GFR, EST AFRICAN AMERICAN: 26 mL/min — AB (ref >60)
GLUCOSE: 130 mg/dL — AB (ref 65–99)
POTASSIUM: 3.4 mmol/L — AB (ref 3.5–5.1)
Sodium: 136 mmol/L (ref 135–145)

## 2016-08-09 LAB — I-STAT TROPONIN, ED: TROPONIN I, POC: 0 ng/mL (ref 0.00–0.08)

## 2016-08-09 MED ORDER — ONDANSETRON 4 MG PO TBDP: 4.0000 mg | ORAL_TABLET | Freq: Three times a day (TID) | ORAL | 0 refills | Status: DC | PRN

## 2016-08-09 MED ORDER — NITROGLYCERIN 0.4 MG SL SUBL: 0.4000 mg | SUBLINGUAL_TABLET | SUBLINGUAL | Status: DC | PRN

## 2016-08-09 NOTE — Discharge Instructions (Signed)

## 2016-08-09 NOTE — ED Notes (Signed)
Patient verbalizes understanding of discharge instructions, prescriptions, home care and follow up care. Patient out of department at this time with family. 

## 2016-08-09 NOTE — ED Triage Notes (Signed)
Pt brought in by [facility] for c/o right side chest pain; pt states the pain started at 1700 today; pt has had a couple episodes of n/v' pt took 2 of her nitro at home with no relief; pt was given 324mg  of baby aspirin en route by [facility]; pt cbg was 136

## 2016-08-09 NOTE — ED Provider Notes (Signed)
[facility] Provider Note   CSN: [id number] Arrival date & time: 08/09/16  2005     History   Chief Complaint Chief Complaint  Patient presents with  . Chest Pain    HPI [name] is a 75 y.o. female.  The history is provided by the patient.  Chest Pain   This is a new problem. The current episode started 3 to 5 hours ago. The problem occurs constantly. The problem has been gradually improving. Associated with: vomiting. Pain location: right chest. The pain is moderate. Radiates to: central chest. Associated symptoms include back pain, nausea, near-syncope and vomiting. Pertinent negatives include no abdominal pain, no fever and no syncope.  Her past medical history is significant for CAD.  Patient with h/o atrial fibrillation ,h/o CAD presents with chest pain She reports "eating peanuts" around lunchtime 5 hrs later she began to have right sided chest pressure that radiated to central chest She also reports feeling SOB and associated nausea/vomiting She also reports feeling generalized weakness   Past Medical History:  Diagnosis Date  . Anemia   . Anginal pain (HCC)    "all the time, but not heart related"  . Arthritis    "legs and back" (09/30/2013)  . Asthma    years ago  . 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 Blurred vision, bilateral    sometimes double vision  . CAD (coronary artery disease)    multivessel, CABG 6/10.  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  Myoview (11/20/11) showed no ischemia & EF 62%  . Chronic lower back pain   . COPD (chronic obstructive pulmonary disease) (HCC)    severe lung disease by PFTs 6/12  . DDD (degenerative disc disease)   . Deep vein thrombosis, upper right extremity (HCC)    post-PPM  . Dehiscence of closure of sternum or sternotomy 07/2009   sternal infection; required flap closure  . Diabetes mellitus, type II (HCC)   . Gastroesophageal reflux disease    not much  . H/O hiatal  hernia   . History of kidney stones   . Hyperlipidemia   . Hypertension   . Migraines    "years ago" (09/30/2013)  . Myocardial infarction (HCC)    years ago  . Obesity   . Orthopnea    sometimes  . Pacemaker   . PUD (peptic ulcer disease)   . Seizure disorder (HCC)   . Seizures (HCC)    "long time ago; don't remember what they were related to" (09/30/2013)none recent  . Sleep apnea    denies CPAP use on 09/30/2013  . Stroke [facility](HCC)    years ago, "residual on left back"  . Urge incontinence of urine     Patient Active Problem List   Diagnosis Date Noted  . Atrial fibrillation (HCC) 02/06/2015  . Encephalopathy, metabolic 10/12/2014  . Intractable nausea and vomiting 10/09/2014  . Epigastric abdominal pain 10/09/2014  . Expected blood loss anemia 12/04/2013  . Obese 12/04/2013  . S/P left THA, AA 12/03/2013  . Preop cardiovascular exam 10/30/2013  . Protein-calorie malnutrition, severe (HCC) 10/02/2013  . Precordial pain 09/30/2013  . Failure to thrive 07/26/2013  . Anemia 07/13/2013  . Altered mental state 07/13/2013  . NSTEMI (non-ST elevated myocardial infarction) (HCC) 07/01/2013  . Acute delirium 06/29/2013  . Fecal impaction (HCC) 06/29/2013  . Total knee replacement status 05/22/2013  . H/O total hip arthroplasty 05/22/2013  . Spinal stenosis, lumbar region, with  neurogenic claudication 05/22/2013  . Osteoarthritis of left hip 05/22/2013  . Noninfectious gastroenteritis and colitis 03/05/2013  . Dehydration 03/05/2013  . Family history of colon cancer 02/15/2013  . PUD (peptic ulcer disease) 02/15/2013  . Diabetes mellitus, type II (HCC)   . Hypertension   . Arteriosclerotic cardiovascular disease (ASCVD)   . Gastroesophageal reflux disease   . Hyperlipidemia   . Deep vein thrombosis, upper right extremity (HCC)   . Obesity   . AV block   . COPD (chronic obstructive pulmonary disease) (HCC)   . Sleep apnea   . Seizure disorder (HCC)   . Anemia,  normocytic normochromic 12/15/2011  . Chronic diastolic heart failure (HCC) 11/18/2011  . PACEMAKER, PERMANENT 05/13/2009  . 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 GASTRIC ULCER 05/12/2009  . Gastroparesis 05/12/2009  . DIVERTICULAR DISEASE 05/12/2009    Past Surgical History:  Procedure Laterality Date  . ABDOMINAL HYSTERECTOMY     partial  . APPENDECTOMY    . BACK SURGERY     x3 total  . CESAREAN SECTION  ~1980  . CHOLECYSTECTOMY    . COLONOSCOPY  04/27/2005   hemorrhoids, diverticula  . COLONOSCOPY WITH ESOPHAGOGASTRODUODENOSCOPY (EGD) N/A 02/20/2013   RMR: pancolonic diverticulosis and internal hemorrhoids. EGD with small hiatal hernia, healed gastric ulcer  . CORONARY ARTERY BYPASS GRAFT  05/1999   - LIMA to LAD, SVG to OM, SVG to PDA  . ESOPHAGOGASTRODUODENOSCOPY     with [facility] dilation,  biopsy, and disruption Schatzki's ring  . ESOPHAGOGASTRODUODENOSCOPY  09/10/2012   RMR: Noncritical Schatzki's ring. Diffuse gastric erosions and an  area of partially healing ulceration-status post biopsy/ Small hiatal hernia. Bx reactive gastropathy.  . INSERT / REPLACE / REMOVE PACEMAKER  1993  . JOINT REPLACEMENT    . LUMBAR DISC SURGERY    . PACEMAKER PLACEMENT  1993   Status post DDD pacemaker implantation in 1993, with Medtronic Kappa generator change in 2003 for  second-degree AV block, most recent gen change by Fawn Kirk 04/14/11  . PORTACATH PLACEMENT Left 07/17/2013   Procedure: INSERTION PORT-A-CATH-left subclavian;  Surgeon: Fabio Bering, MD;  Location: [facility];  Service: General;  Laterality: Left;  . POSTERIOR LUMBAR FUSION    . RECONSTRUCTIVE REPAIR STERNAL     for infection s/p CABG 8/10  . REPLACEMENT TOTAL KNEE Bilateral   . THYROIDECTOMY    . TOTAL HIP ARTHROPLASTY Right   . TOTAL HIP ARTHROPLASTY Left 12/03/2013   Procedure: LEFT TOTAL HIP ARTHROPLASTY ANTERIOR APPROACH;  Surgeon: Shelda Pal, MD;  Location: [facility];  Service: Orthopedics;  Laterality: Left;  . TUBAL LIGATION      . WRIST SURGERY Left    "tumor taken off" (09/30/2013)    OB History    No data available       Home Medications    Prior to Admission medications   Medication Sig Start Date End Date Taking? Authorizing Provider  acetaminophen (TYLENOL) 325 MG tablet Take 650 mg by mouth as directed.    Historical Provider, MD  apixaban (ELIQUIS) 5 MG TABS tablet Take 5 mg by mouth 2 (two) times daily.     Historical Provider, MD  aspirin 81 MG tablet Take 81 mg by mouth daily.    Historical Provider, MD  atorvastatin (LIPITOR) 40 MG tablet Take 1 tablet (40 mg total) by mouth daily. 09/10/14   Antoine Poche, MD  benazepril (LOTENSIN) 40 MG tablet Take 40 mg by mouth every morning.    Historical Provider,  MD  dicyclomine (BENTYL) 20 MG tablet Take one every 6 hours as needed for abd cramps 02/12/15   Bethann Berkshire, MD  doxepin (SINEQUAN) 10 MG capsule Take 10 mg by mouth at bedtime.    Historical Provider, MD  ferrous sulfate 325 (65 FE) MG tablet Take 325 mg by mouth daily with breakfast.    Historical Provider, MD  ketorolac (ACULAR) 0.5 % ophthalmic solution Place 1 drop into the right eye 3 (three) times daily.    Historical Provider, MD  levETIRAcetam (KEPPRA) 500 MG tablet Take 500 mg by mouth 2 (two) times daily.    Historical Provider, MD  lubiprostone (AMITIZA) 24 MCG capsule Take 24 mcg by mouth 2 (two) times daily with a meal.    Historical Provider, MD  metFORMIN (GLUCOPHAGE) 500 MG tablet Take 500 mg by mouth 2 (two) times daily with a meal.     Historical Provider, MD  metoprolol tartrate (LOPRESSOR) 25 MG tablet Take 25 mg by mouth 2 (two) times daily.     Historical Provider, MD  nitroGLYCERIN (NITROSTAT) 0.4 MG SL tablet Place 1 tablet (0.4 mg total) under the tongue every 5 (five) minutes as needed for chest pain. 05/20/14   Antoine Poche, MD  omega-3 acid ethyl esters (LOVAZA) 1 G capsule Take 2 g by mouth 2 (two) times daily.     Historical Provider, MD  ondansetron (ZOFRAN) 4  MG tablet 1 po q6h prn nausea Patient taking differently: Take 4 mg by mouth every 6 (six) hours as needed for nausea or vomiting.  04/01/15   Ivery Quale, PA-C  oxybutynin (DITROPAN-XL) 10 MG 24 hr tablet Take 10 mg by mouth every morning.     Historical Provider, MD  pantoprazole (PROTONIX) 40 MG tablet Take 1 tablet (40 mg total) by mouth daily. 12/03/13   Gelene Mink, NP  potassium chloride (K-DUR,KLOR-CON) 10 MEQ tablet Take 10 mEq by mouth daily.    Historical Provider, MD    Family History Family History  Problem Relation Age of Onset  . Heart disease Mother     MI  . Colon cancer Father     age 57  . Diabetes    . Coronary artery disease      Female <55  . Cancer      Social History Social History  Substance Use Topics  . Smoking status: Never Smoker  . Smokeless tobacco: Former Neurosurgeon    Types: Snuff     Comment: 09/30/2013 "stopped snuff a long time ago"  . Alcohol use No     Allergies   Codeine; Other; Oxycodone hcl; Peanuts [peanut oil]; and Reglan [metoclopramide]   Review of Systems Review of Systems  Constitutional: Negative for fever.  Cardiovascular: Positive for chest pain and near-syncope. Negative for syncope.  Gastrointestinal: Positive for nausea and vomiting. Negative for abdominal pain.  Musculoskeletal: Positive for back pain.  All other systems reviewed and are negative.    Physical Exam Updated Vital Signs BP 121/71 (BP Location: Right Arm)   Pulse 73   Temp 98.5 F (36.9 C) (Oral)   Resp 18   Ht 5\' 2"  (1.575 m)   Wt 108.9 kg   SpO2 97%   BMI 43.90 kg/m   Physical Exam  CONSTITUTIONAL: Elderly, no acute distress HEAD: Normocephalic/atraumatic EYES: EOMI ENMT: Mucous membranes moist NECK: supple no meningeal signs SPINE/BACK:entire spine nontender CV: irregular, no loud murmurs noted LUNGS: Lungs are clear to auscultation bilaterally, no apparent distress ABDOMEN: soft,  nontender NEURO: Pt is awake/alert/appropriate, moves  all extremitiesx4. EXTREMITIES: pulses normal/equal, full ROM SKIN: warm, color normal PSYCH: no abnormalities of mood noted, alert and oriented to situation  ED Treatments / Results  Labs (all labs ordered are listed, but only abnormal results are displayed) Labs Reviewed  BASIC METABOLIC PANEL - Abnormal; Notable for the following:       Result Value   Potassium 3.4 (*)    Glucose, Bld 130 (*)    BUN 42 (*)    Creatinine, Ser 2.01 (*)    GFR calc non Af Amer 23 (*)    GFR calc Af Amer 26 (*)    All other components within normal limits  CBC - Abnormal; Notable for the following:    WBC 11.5 (*)    Hemoglobin 10.6 (*)    HCT 33.0 (*)    RDW 16.6 (*)    All other components within normal limits  URINE CULTURE  URINALYSIS, ROUTINE W REFLEX MICROSCOPIC (NOT AT [facility])  I-STAT TROPOININ, ED    EKG  EKG Interpretation  Date/Time:  Tuesday August 09 2016 20:17:31 EDT Ventricular Rate:  76 PR Interval:    QRS Duration: 106 QT Interval:  362 QTC Calculation: 407 R Axis:   -42 Text Interpretation:  Atrial fibrillation Incomplete RBBB and LAFB Abnormal R-wave progression, early transition Nonspecific T abnormalities, lateral leads Abnormal ekg Confirmed by Bebe Shaggy  MD, Deajah Erkkila ([id number]) on 08/09/2016 8:25:09 PM       Radiology Dg Chest 2 View  Result Date: 08/09/2016 CLINICAL DATA:  Allergic reaction to peanuts with abdominal pain and vomiting. EXAM: CHEST  2 VIEW COMPARISON:  02/12/2015 FINDINGS: Previous CABG. Dual lead pacemaker well positioned. Power port from a left subclavian approach has its tip in the SVC. Chronic cardiomegaly. Chronic aortic atherosclerosis. The lungs are clear. The vascularity is normal. No effusions. No acute bone finding. IMPRESSION: No active cardiopulmonary disease. Previous CABG. Pacemaker. Cardiomegaly. Power port. Electronically Signed   By: Paulina Fusi M.D.   On: 08/09/2016 21:53    Procedures Procedures (including critical care  time)  Medications Ordered in ED Medications - No data to display   Initial Impression / Assessment and Plan / ED Course  I have reviewed the triage vital signs and the nursing notes.  Pertinent labs & imaging results that were available during my care of the patient were reviewed by me and considered in my medical decision making (see chart for details).  Clinical Course    This patients CHA2DS2-VASc Score and unadjusted Ischemic Stroke Rate (% per year) is equal to 11.2 % stroke rate/year from a score of 7  Above score calculated as 1 point each if present [CHF, HTN, DM, Vascular=MI/PAD/Aortic Plaque, Age if 65-74, or Female] Above score calculated as 2 points each if present [Age > 75, or Stroke/TIA/TE]   10:26 PM Pt feels improved Daughter arrived - apparently pt has allergy to peanuts but still ate them today and a few hours later developed vomiting.  Around the time of vomiting she had CP.  She feels improved.  CP was right sided, and on repeat exam it was reproducible with palpation.  She is well appearing.  Suspicion for ACS/PE/Dissection is low.    Daughter she did have some confusion earlier today similar to prior UTI - no UTI noted, pt now at baseline.  No new focal weakness.  She ambulated in the ED with cane (baseline) and she had slight dizziness but could walk on her  own.  She is afebrile.  No focal weakness.  She has been on prednisone recently for back pain and this is being tapered  She has renal insufficiency again She will stop lasix for now, f/u with PCP this week for lab recheck I won't stop eliquis for now due to benefits in setting of afib when reviewing literature on renal function with this medicine.  It is likely her creatinine will improve once lasix is stopped and starts taking PO fluids at home  Pt requests zofran for d/c home     Final Clinical Impressions(s) / ED Diagnoses   Final diagnoses:  Chest pain, unspecified chest pain type   Non-intractable vomiting with nausea, vomiting of unspecified type  Renal insufficiency  Chronic atrial fibrillation (HCC)    New Prescriptions New Prescriptions   ONDANSETRON (ZOFRAN ODT) 4 MG DISINTEGRATING TABLET    Take 1 tablet (4 mg total) by mouth every 8 (eight) hours as needed.     Zadie Rhine, MD 08/09/16 2230

## 2016-08-10 IMAGING — DX DG ABDOMEN ACUTE W/ 1V CHEST
3 series · 3 of 3 positions shown · non-contrast
Comparison: 10/09/2014

CLINICAL DATA: Lower abdominal pain, nausea, diarrhea

EXAM:
ACUTE ABDOMEN SERIES (ABDOMEN 2 VIEW & CHEST 1 VIEW)

[abdomen erect]
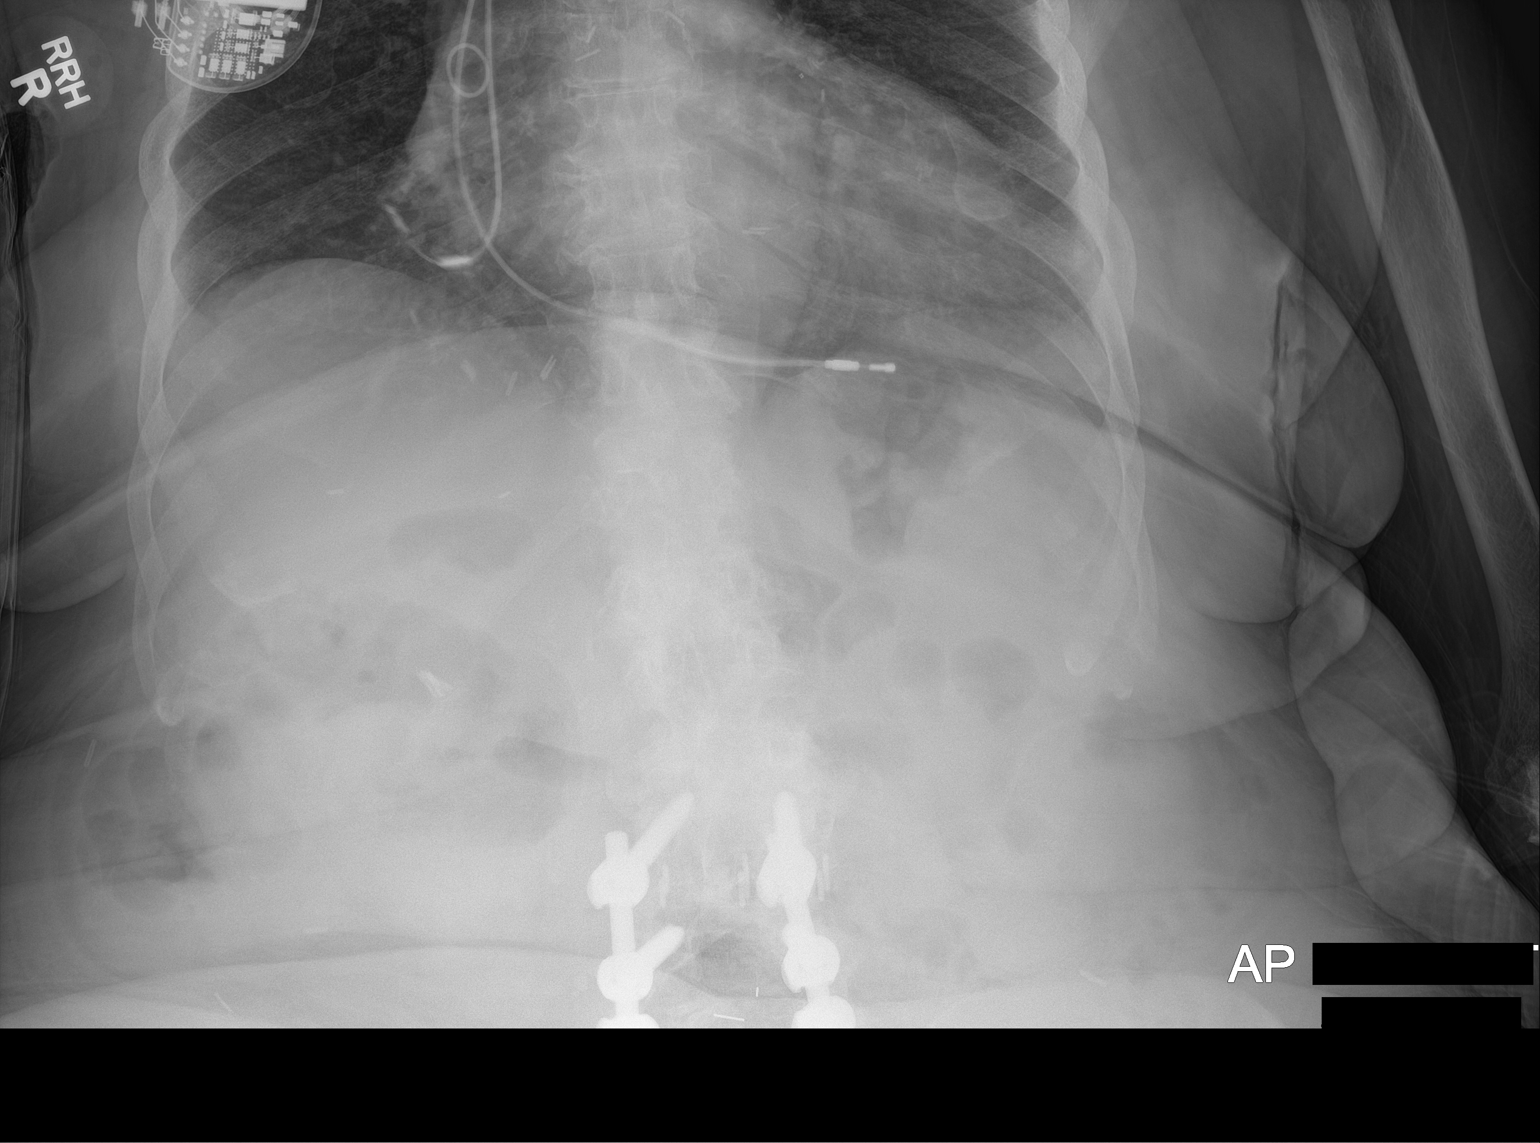

[abdomen supine]
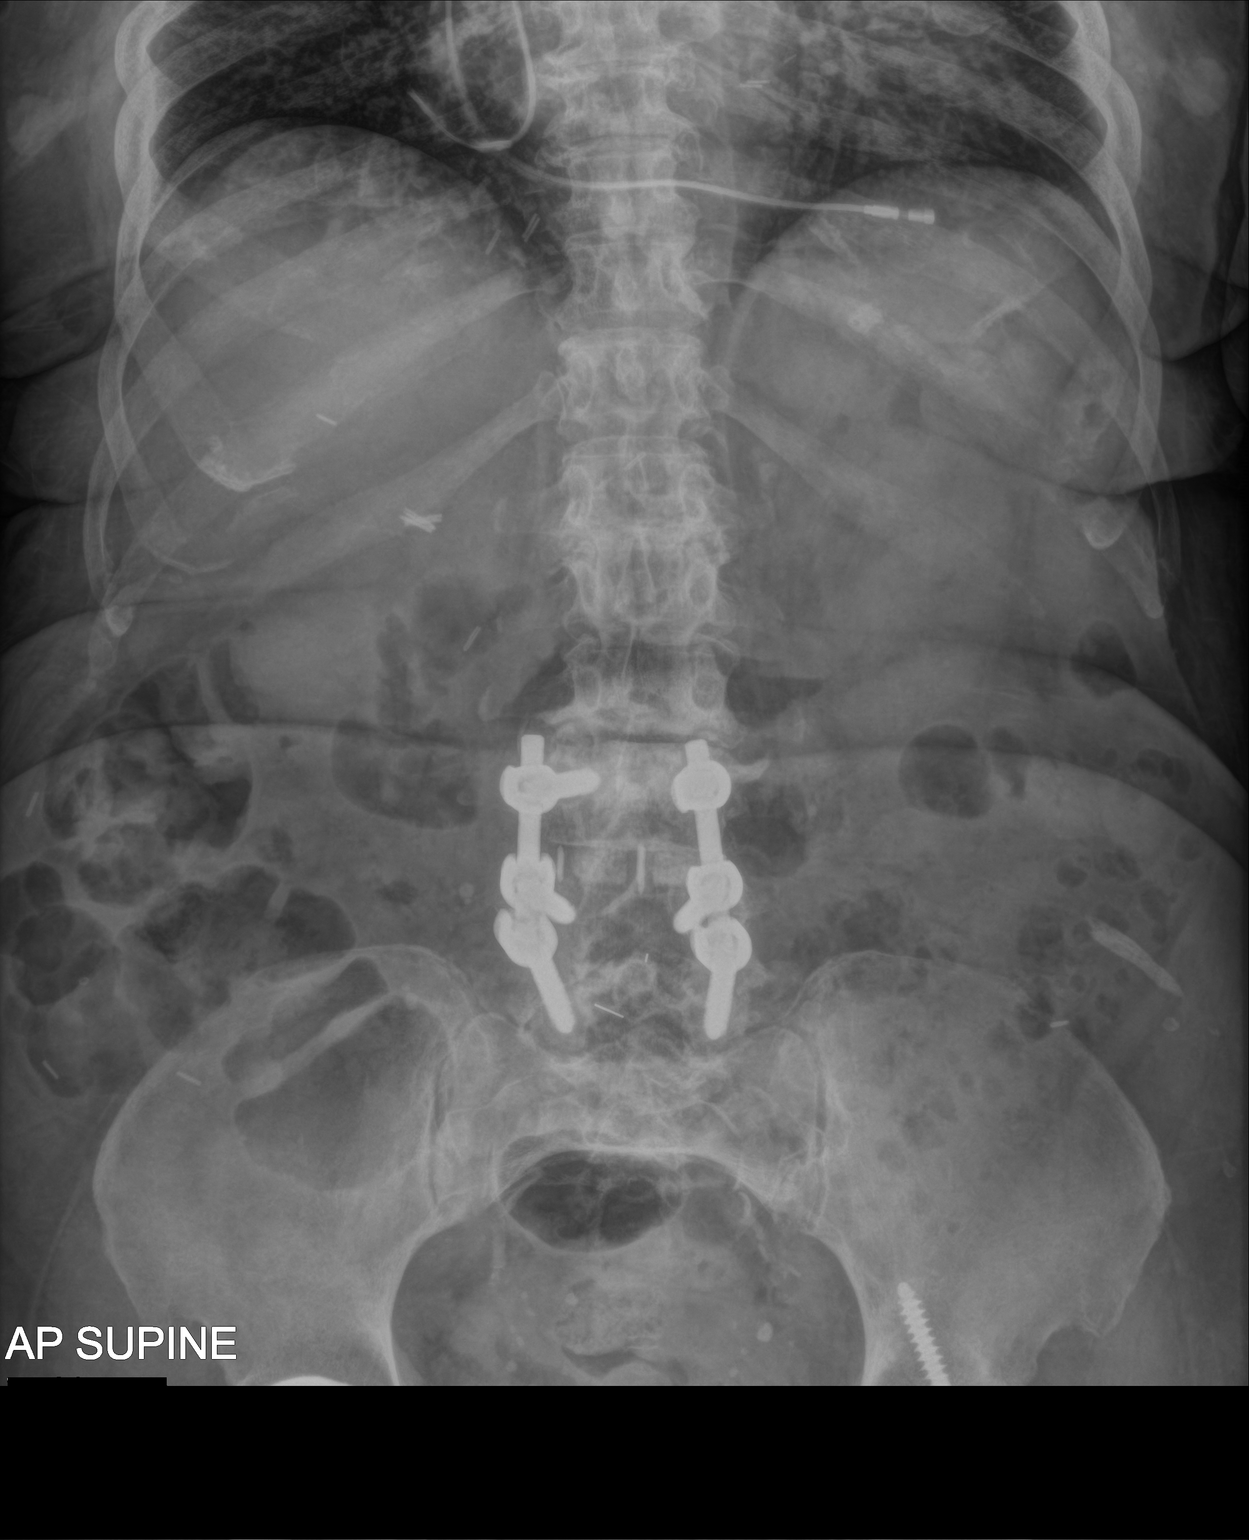

[chest ap]
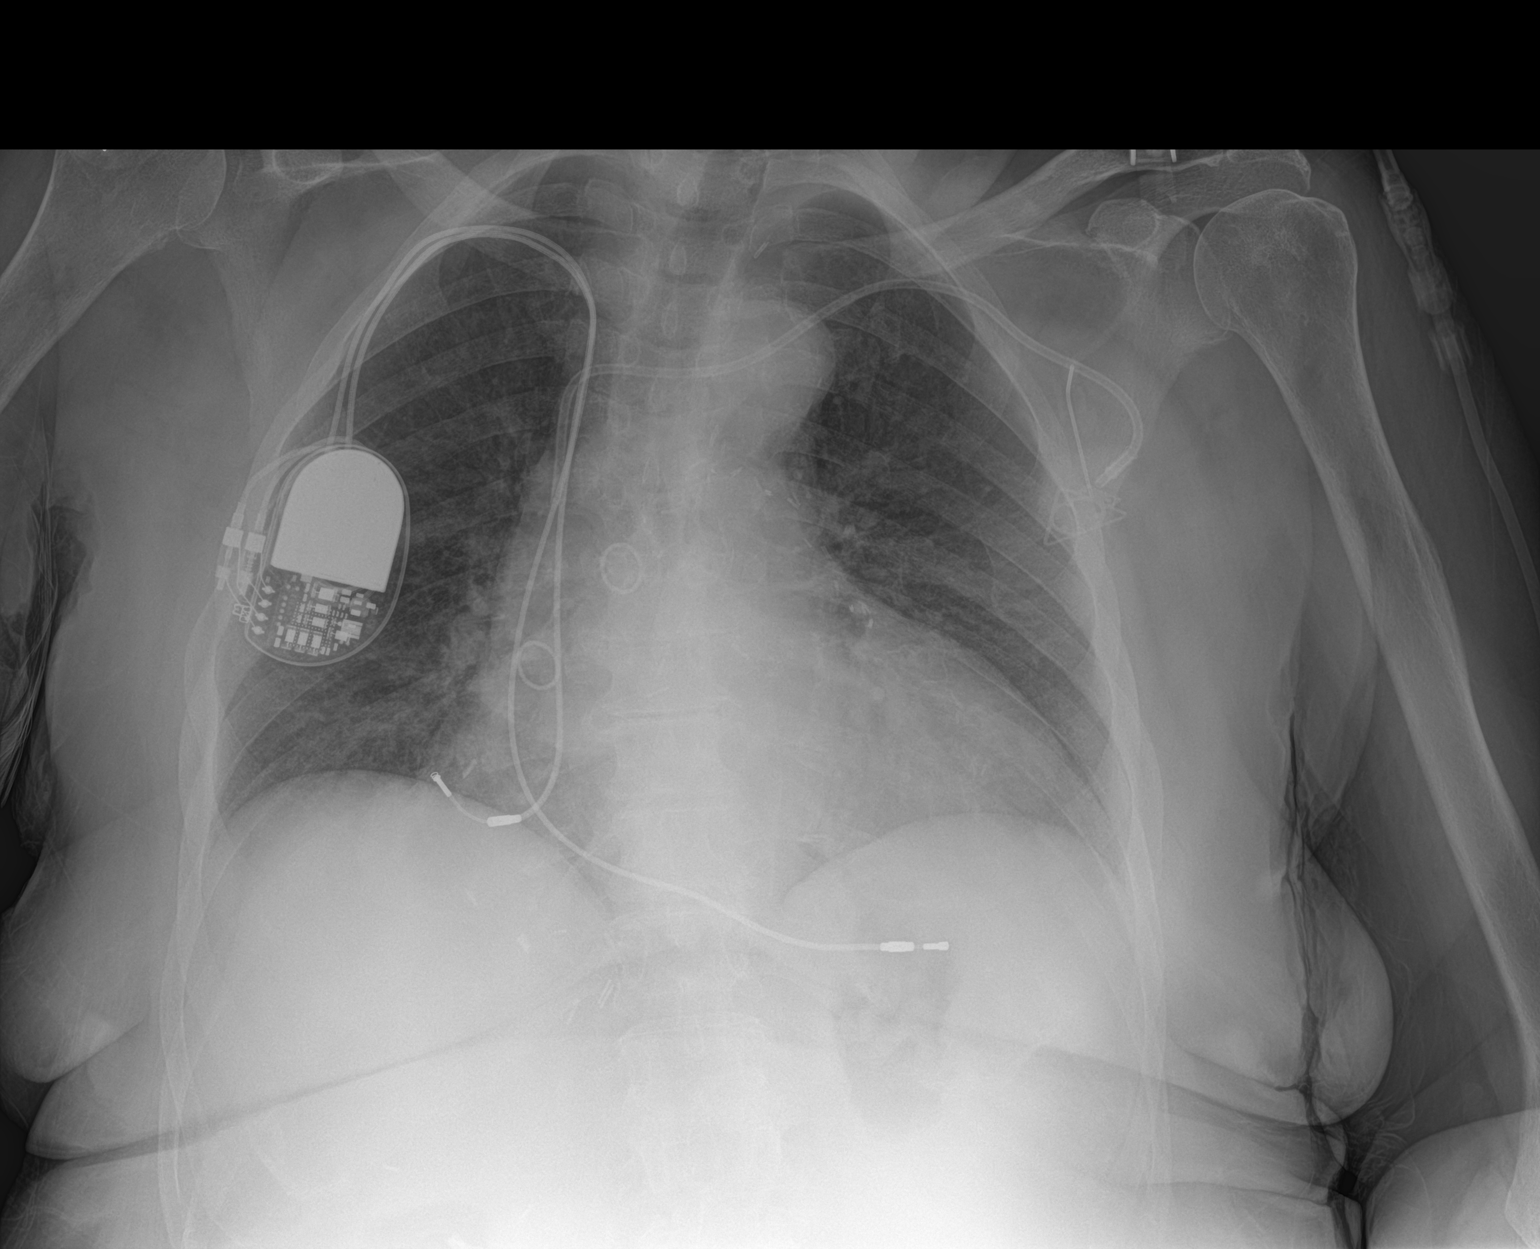

[3 of 3 positions shown; findings below may reference images not displayed]

FINDINGS: Cardiomegaly is noted. Dual lead cardiac pacemaker is unchanged in
position. Stable left subclavian Port-A-Cath position. No acute
infiltrate or pulmonary edema. Unremarkable small bowel gas pattern.
Moderate colonic stool are noted in right colon and proximal
transverse colon. Bilateral hip prosthesis. No free abdominal air.
Postsurgical changes lumbar spine.
IMPRESSION: No acute disease within chest. Unremarkable small bowel gas pattern.
Moderate stool noted in right colon and proximal transverse colon.
Postsurgical changes lower lumbar spine. Bilateral hip prosthesis
partially visualized.

## 2016-08-11 LAB — URINE CULTURE: CULTURE: NO GROWTH

## 2016-08-15 ENCOUNTER — Other Ambulatory Visit: Payer: Self-pay | Admitting: *Deleted

## 2016-08-17 NOTE — Telephone Encounter (Unsigned)
This encounter was created in error - please disregard.

## 2016-08-18 ENCOUNTER — Other Ambulatory Visit: Payer: Self-pay | Admitting: *Deleted

## 2016-08-18 ENCOUNTER — Encounter: Payer: Self-pay | Admitting: *Deleted

## 2016-08-18 NOTE — Patient Outreach (Signed)
[facility] Care Management  08/18/2016  Albert P Hardigree 1941-12-02 [id number]  Referral via EMMI-Prevent:  Telephone call to patient who was advised of reason for call & of [facility].  [place] verification received from patient.   Patient voices that she has primary care provider and attends appointments. Voices that she uses community transportation ([employer]) to get to appointments.   States daughter's family lives with her & daughter assists her when needed. States medications are prepared by local pharmacy and she takes as packaged. States no trouble getting medications.   Voices that she feels health conditions are stable but wishes to learn more about diabetes. States she owns glucometer and checks blood sugar daily. Voices that she watches her diet and walks daily.   Patient consents to referral to RN Health Coach.  Plan: Refer to care management assistant to assign to Health Coach.  Colleen CanLinda Richa Shor, RN BSN CCM Care Management Coordinator [facility]  [phone]  .

## 2016-08-18 NOTE — Progress Notes (Signed)
This encounter was created in error - please disregard.

## 2016-08-29 ENCOUNTER — Emergency Department (HOSPITAL_COMMUNITY): Payer: Commercial Managed Care - HMO

## 2016-08-29 ENCOUNTER — Observation Stay (HOSPITAL_COMMUNITY)
Admission: EM | Admit: 2016-08-29 | Discharge: 2016-08-30 | Disposition: A | Discharge status: Home / Self Care | Payer: Commercial Managed Care - HMO | Attending: Pulmonary Disease | Admitting: Pulmonary Disease

## 2016-08-29 ENCOUNTER — Encounter (HOSPITAL_COMMUNITY): Payer: Self-pay | Admitting: *Deleted

## 2016-08-29 DIAGNOSIS — I10 Essential (primary) hypertension: Secondary | ICD-10-CM | POA: Diagnosis not present

## 2016-08-29 DIAGNOSIS — Z950 Presence of cardiac pacemaker: Secondary | ICD-10-CM

## 2016-08-29 DIAGNOSIS — E119 Type 2 diabetes mellitus without complications: Secondary | ICD-10-CM

## 2016-08-29 DIAGNOSIS — E785 Hyperlipidemia, unspecified: Secondary | ICD-10-CM | POA: Diagnosis present

## 2016-08-29 DIAGNOSIS — I2510 Atherosclerotic heart disease of native coronary artery without angina pectoris: Secondary | ICD-10-CM | POA: Diagnosis present

## 2016-08-29 DIAGNOSIS — I25708 Atherosclerosis of coronary artery bypass graft(s), unspecified, with other forms of angina pectoris: Secondary | ICD-10-CM

## 2016-08-29 DIAGNOSIS — I209 Angina pectoris, unspecified: Secondary | ICD-10-CM | POA: Diagnosis not present

## 2016-08-29 DIAGNOSIS — Z79899 Other long term (current) drug therapy: Secondary | ICD-10-CM | POA: Insufficient documentation

## 2016-08-29 DIAGNOSIS — R9431 Abnormal electrocardiogram [ECG] [EKG]: Secondary | ICD-10-CM

## 2016-08-29 DIAGNOSIS — J449 Chronic obstructive pulmonary disease, unspecified: Secondary | ICD-10-CM | POA: Diagnosis present

## 2016-08-29 DIAGNOSIS — I4891 Unspecified atrial fibrillation: Secondary | ICD-10-CM | POA: Diagnosis present

## 2016-08-29 DIAGNOSIS — I5032 Chronic diastolic (congestive) heart failure: Secondary | ICD-10-CM | POA: Diagnosis present

## 2016-08-29 DIAGNOSIS — R072 Precordial pain: Secondary | ICD-10-CM | POA: Diagnosis not present

## 2016-08-29 DIAGNOSIS — R079 Chest pain, unspecified: Secondary | ICD-10-CM | POA: Diagnosis not present

## 2016-08-29 DIAGNOSIS — I482 Chronic atrial fibrillation: Secondary | ICD-10-CM

## 2016-08-29 DIAGNOSIS — R0789 Other chest pain: Secondary | ICD-10-CM | POA: Diagnosis not present

## 2016-08-29 DIAGNOSIS — J45909 Unspecified asthma, uncomplicated: Secondary | ICD-10-CM | POA: Diagnosis not present

## 2016-08-29 DIAGNOSIS — I480 Paroxysmal atrial fibrillation: Secondary | ICD-10-CM

## 2016-08-29 DIAGNOSIS — Z23 Encounter for immunization: Secondary | ICD-10-CM | POA: Diagnosis not present

## 2016-08-29 DIAGNOSIS — N183 Chronic kidney disease, stage 3 unspecified: Secondary | ICD-10-CM | POA: Diagnosis present

## 2016-08-29 DIAGNOSIS — K219 Gastro-esophageal reflux disease without esophagitis: Secondary | ICD-10-CM | POA: Diagnosis present

## 2016-08-29 DIAGNOSIS — R42 Dizziness and giddiness: Secondary | ICD-10-CM | POA: Diagnosis not present

## 2016-08-29 DIAGNOSIS — Z8673 Personal history of transient ischemic attack (TIA), and cerebral infarction without residual deficits: Secondary | ICD-10-CM | POA: Diagnosis not present

## 2016-08-29 DIAGNOSIS — E1122 Type 2 diabetes mellitus with diabetic chronic kidney disease: Secondary | ICD-10-CM

## 2016-08-29 LAB — GLUCOSE, CAPILLARY: GLUCOSE-CAPILLARY: 114 mg/dL — AB (ref 65–99)

## 2016-08-29 LAB — LIPID PANEL
CHOL/HDL RATIO: 2.4 ratio
Cholesterol: 114 mg/dL (ref 0–200)
HDL: 47 mg/dL (ref >40)
LDL Cholesterol: 57 mg/dL (ref 0–99)
TRIGLYCERIDES: 50 mg/dL (ref <150)
VLDL: 10 mg/dL (ref 0–40)

## 2016-08-29 LAB — CBC
HEMATOCRIT: 32.2 % — AB (ref 36.0–46.0)
HEMOGLOBIN: 10.1 g/dL — AB (ref 12.0–15.0)
MCH: 25.4 pg — ABNORMAL LOW (ref 26.0–34.0)
MCHC: 31.4 g/dL (ref 30.0–36.0)
MCV: 81.1 fL (ref 78.0–100.0)
Platelets: 210 10*3/uL (ref 150–400)
RBC: 3.97 MIL/uL (ref 3.87–5.11)
RDW: 16 % — ABNORMAL HIGH (ref 11.5–15.5)
WBC: 7.4 10*3/uL (ref 4.0–10.5)

## 2016-08-29 LAB — BASIC METABOLIC PANEL
ANION GAP: 6 (ref 5–15)
BUN: 26 mg/dL — ABNORMAL HIGH (ref 6–20)
CHLORIDE: 108 mmol/L (ref 101–111)
CO2: 22 mmol/L (ref 22–32)
Calcium: 9.6 mg/dL (ref 8.9–10.3)
Creatinine, Ser: 1.61 mg/dL — ABNORMAL HIGH (ref 0.44–1.00)
GFR, EST AFRICAN AMERICAN: 34 mL/min — AB (ref >60)
GFR, EST NON AFRICAN AMERICAN: 30 mL/min — AB (ref >60)
Glucose, Bld: 107 mg/dL — ABNORMAL HIGH (ref 65–99)
POTASSIUM: 3.5 mmol/L (ref 3.5–5.1)
SODIUM: 136 mmol/L (ref 135–145)

## 2016-08-29 LAB — HEPATIC FUNCTION PANEL
ALBUMIN: 3.2 g/dL — AB (ref 3.5–5.0)
ALT: 14 U/L (ref 14–54)
AST: 19 U/L (ref 15–41)
Alkaline Phosphatase: 53 U/L (ref 38–126)
Bilirubin, Direct: 0.1 mg/dL (ref 0.1–0.5)
Indirect Bilirubin: 0.2 mg/dL — ABNORMAL LOW (ref 0.3–0.9)
TOTAL PROTEIN: 7 g/dL (ref 6.5–8.1)
Total Bilirubin: 0.3 mg/dL (ref 0.3–1.2)

## 2016-08-29 LAB — TSH: TSH: 0.623 u[IU]/mL (ref 0.350–4.500)

## 2016-08-29 LAB — MRSA PCR SCREENING: MRSA by PCR: POSITIVE — AB

## 2016-08-29 LAB — I-STAT TROPONIN, ED: Troponin i, poc: 0.01 ng/mL (ref 0.00–0.08)

## 2016-08-29 LAB — TROPONIN I

## 2016-08-29 MED ORDER — NITROGLYCERIN 0.4 MG SL SUBL
0.4000 mg | SUBLINGUAL_TABLET | SUBLINGUAL | Status: DC | PRN
  Administered 2016-08-29: 0.4 mg via SUBLINGUAL

## 2016-08-29 MED ORDER — LEVETIRACETAM 500 MG PO TABS
500.0000 mg | ORAL_TABLET | Freq: Two times a day (BID) | ORAL | Status: DC
  Administered 2016-08-29 – 2016-08-30 (×2): 500 mg via ORAL
  Filled 2016-08-29 (×2): qty 1

## 2016-08-29 MED ORDER — ATORVASTATIN CALCIUM 40 MG PO TABS
40.0000 mg | ORAL_TABLET | Freq: Every evening | ORAL | Status: DC
  Administered 2016-08-29: 40 mg via ORAL
  Filled 2016-08-29 (×2): qty 1

## 2016-08-29 MED ORDER — LUBIPROSTONE 24 MCG PO CAPS
24.0000 ug | ORAL_CAPSULE | Freq: Two times a day (BID) | ORAL | Status: DC
  Administered 2016-08-29 – 2016-08-30 (×2): 24 ug via ORAL
  Filled 2016-08-29 (×3): qty 1

## 2016-08-29 MED ORDER — PNEUMOCOCCAL VAC POLYVALENT 25 MCG/0.5ML IJ INJ
0.5000 mL | INJECTION | INTRAMUSCULAR | Status: AC
Start: 2016-08-30 — End: 2016-08-30
  Administered 2016-08-30: 0.5 mL via INTRAMUSCULAR
  Filled 2016-08-29: qty 0.5

## 2016-08-29 MED ORDER — CHLORHEXIDINE GLUCONATE CLOTH 2 % EX PADS
6.0000 | MEDICATED_PAD | Freq: Every day | CUTANEOUS | Status: DC
  Administered 2016-08-30: 6 via TOPICAL

## 2016-08-29 MED ORDER — TRAZODONE HCL 50 MG PO TABS
50.0000 mg | ORAL_TABLET | Freq: Every day | ORAL | Status: DC
  Administered 2016-08-29: 50 mg via ORAL
  Filled 2016-08-29: qty 1

## 2016-08-29 MED ORDER — ONDANSETRON HCL 4 MG/2ML IJ SOLN: 4.0000 mg | Freq: Four times a day (QID) | INTRAMUSCULAR | Status: DC | PRN

## 2016-08-29 MED ORDER — OMEGA-3-ACID ETHYL ESTERS 1 G PO CAPS
2.0000 g | ORAL_CAPSULE | Freq: Two times a day (BID) | ORAL | Status: DC
  Administered 2016-08-29 – 2016-08-30 (×2): 2 g via ORAL
  Filled 2016-08-29 (×2): qty 2

## 2016-08-29 MED ORDER — PANTOPRAZOLE SODIUM 40 MG PO TBEC
40.0000 mg | DELAYED_RELEASE_TABLET | Freq: Every day | ORAL | Status: DC
  Administered 2016-08-29 – 2016-08-30 (×2): 40 mg via ORAL
  Filled 2016-08-29 (×2): qty 1

## 2016-08-29 MED ORDER — INSULIN ASPART 100 UNIT/ML ~~LOC~~ SOLN
0.0000 [IU] | Freq: Three times a day (TID) | SUBCUTANEOUS | Status: DC
  Administered 2016-08-30: 2 [IU] via SUBCUTANEOUS

## 2016-08-29 MED ORDER — GI COCKTAIL ~~LOC~~: 30.0000 mL | Freq: Four times a day (QID) | ORAL | Status: DC | PRN

## 2016-08-29 MED ORDER — DOCUSATE SODIUM 100 MG PO CAPS
100.0000 mg | ORAL_CAPSULE | Freq: Every day | ORAL | Status: DC
  Administered 2016-08-30: 100 mg via ORAL
  Filled 2016-08-29: qty 1

## 2016-08-29 MED ORDER — APIXABAN 5 MG PO TABS
5.0000 mg | ORAL_TABLET | Freq: Two times a day (BID) | ORAL | Status: DC
  Administered 2016-08-29 – 2016-08-30 (×2): 5 mg via ORAL
  Filled 2016-08-29 (×2): qty 1

## 2016-08-29 MED ORDER — ACETAMINOPHEN 325 MG PO TABS: 650.0000 mg | ORAL_TABLET | ORAL | Status: DC | PRN

## 2016-08-29 MED ORDER — BENAZEPRIL HCL 10 MG PO TABS
40.0000 mg | ORAL_TABLET | Freq: Every morning | ORAL | Status: DC
  Administered 2016-08-30: 40 mg via ORAL
  Filled 2016-08-29: qty 4

## 2016-08-29 MED ORDER — ASPIRIN EC 325 MG PO TBEC
325.0000 mg | DELAYED_RELEASE_TABLET | Freq: Every day | ORAL | Status: DC
  Administered 2016-08-30: 325 mg via ORAL
  Filled 2016-08-29: qty 1

## 2016-08-29 MED ORDER — POTASSIUM CHLORIDE CRYS ER 10 MEQ PO TBCR
10.0000 meq | EXTENDED_RELEASE_TABLET | Freq: Every day | ORAL | Status: DC
  Administered 2016-08-30: 10 meq via ORAL
  Filled 2016-08-29: qty 1

## 2016-08-29 MED ORDER — MORPHINE SULFATE (PF) 2 MG/ML IV SOLN: 2.0000 mg | INTRAVENOUS | Status: DC | PRN

## 2016-08-29 MED ORDER — METOPROLOL TARTRATE 25 MG PO TABS
25.0000 mg | ORAL_TABLET | Freq: Two times a day (BID) | ORAL | Status: DC
  Administered 2016-08-29 – 2016-08-30 (×2): 25 mg via ORAL
  Filled 2016-08-29 (×2): qty 1

## 2016-08-29 MED ORDER — KETOROLAC TROMETHAMINE 0.5 % OP SOLN
1.0000 [drp] | Freq: Three times a day (TID) | OPHTHALMIC | Status: DC
  Administered 2016-08-29 – 2016-08-30 (×2): 1 [drp] via OPHTHALMIC
  Filled 2016-08-29: qty 3

## 2016-08-29 MED ORDER — SODIUM CHLORIDE 0.9 % IV BOLUS (SEPSIS)
500.0000 mL | Freq: Once | INTRAVENOUS | Status: AC
  Administered 2016-08-29: 500 mL via INTRAVENOUS

## 2016-08-29 MED ORDER — FERROUS SULFATE 325 (65 FE) MG PO TABS
325.0000 mg | ORAL_TABLET | Freq: Every day | ORAL | Status: DC
  Administered 2016-08-30: 325 mg via ORAL
  Filled 2016-08-29: qty 1

## 2016-08-29 MED ORDER — MUPIROCIN 2 % EX OINT
1.0000 "application " | TOPICAL_OINTMENT | Freq: Two times a day (BID) | CUTANEOUS | Status: DC
  Administered 2016-08-29 – 2016-08-30 (×2): 1 via NASAL
  Filled 2016-08-29: qty 22

## 2016-08-29 NOTE — H&P (Addendum)
History and Physical    [name]:[id number] DOB: 02-21-1941 DOA: 08/29/2016  PCP: Fredirick Maudlin, MD Consultants:  None Patient coming from: home - lives with daughter, SIL, granddaughter; NOK: daughter, [name]  Chief Complaint: chest pain  HPI: [name] is a 75 y.o. female with medical history significant of CABG (2010), HTN, HLD, DM, pacemaker, afib on Eliquis presenting with chest pain.  This AM, patient felt fine upon awakening.  Made her bed, cleaned up her room, washed dishes, went and swept the porch and then started feeling very dizzy.  She was trying to get back to the bedroom but fell before she could get there.  Larey Seat because she was dizzy, laid there for a while.  May have lost consciousness.  No one else was home.  Was eventually able to get up and into chair.  Her son called and he suggested that she call 911.  R-sided chest pain, lasted a pretty good while, maybe 30-45 minutes.  She took 2 NTG and it didn't improve but then she was given another NTG and 4 ASA by EMS and then it improved.  Pain is still not gone completely, comes back mildly every once in a while.  Comes on while she is at rest and improves spontaneously.  Long time since last stress test - normal in 10/14.  Denies h/o heart cath.   ED Course: Per Dr. Estell Harpin: Patient with a history of coronary artery disease. Patient's EKG shows worsening inverted T waves inferiorly. Patient will be admitted for rule out then cardiology will consult  Review of Systems: As per HPI; otherwise 10 point review of systems reviewed and negative.   Ambulatory Status:  Ambulates with a cane  Past Medical History:  Diagnosis Date  . Anemia   . Anginal pain (HCC)    "all the time, but not heart related"  . Arthritis    "legs and back" (09/30/2013)  . Asthma    years ago  . 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 Blurred vision, bilateral    sometimes double  vision  . CAD (coronary artery disease)    multivessel, CABG 6/10.  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  Myoview (11/20/11) showed no ischemia & EF 62%  . Chronic lower back pain   . COPD (chronic obstructive pulmonary disease) (HCC)    severe lung disease by PFTs 6/12  . DDD (degenerative disc disease)   . Deep vein thrombosis, upper right extremity (HCC)    post-PPM  . Dehiscence of closure of sternum or sternotomy 07/2009   sternal infection; required flap closure  . Diabetes mellitus, type II (HCC)   . Gastroesophageal reflux disease    not much  . H/O hiatal hernia   . History of kidney stones   . Hyperlipidemia   . Hypertension   . Migraines    "years ago" (09/30/2013)  . Obesity   . Orthopnea    sometimes  . Pacemaker   . PUD (peptic ulcer disease)   . Seizures (HCC)    "long time ago; don't remember what they were related to" (09/30/2013)none recent  . Sleep apnea    denies CPAP use on 09/30/2013  . 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 Urge incontinence of urine     Past Surgical History:  Procedure Laterality Date  . ABDOMINAL HYSTERECTOMY     partial  . APPENDECTOMY    .  BACK SURGERY     x3 total  . CESAREAN SECTION  ~1980  . CHOLECYSTECTOMY    . COLONOSCOPY  04/27/2005   hemorrhoids, diverticula  . COLONOSCOPY WITH ESOPHAGOGASTRODUODENOSCOPY (EGD) N/A 02/20/2013   RMR: pancolonic diverticulosis and internal hemorrhoids. EGD with small hiatal hernia, healed gastric ulcer  . CORONARY ARTERY BYPASS GRAFT  05/1999   - LIMA to LAD, SVG to OM, SVG to PDA  . ESOPHAGOGASTRODUODENOSCOPY     with [facility] dilation,  biopsy, and disruption Schatzki's ring  . ESOPHAGOGASTRODUODENOSCOPY  09/10/2012   RMR: Noncritical Schatzki's ring. Diffuse gastric erosions and an  area of partially healing ulceration-status post biopsy/ Small hiatal hernia. Bx reactive gastropathy.  . INSERT / REPLACE / REMOVE PACEMAKER  1993  . JOINT REPLACEMENT    . LUMBAR DISC SURGERY    .  PACEMAKER PLACEMENT  1993   Status post DDD pacemaker implantation in 1993, with Medtronic Kappa generator change in 2003 for  second-degree AV block, most recent gen change by Fawn Kirk 04/14/11  . PORTACATH PLACEMENT Left 07/17/2013   Procedure: INSERTION PORT-A-CATH-left subclavian;  Surgeon: Fabio Bering, MD;  Location: [facility];  Service: General;  Laterality: Left;  . POSTERIOR LUMBAR FUSION    . RECONSTRUCTIVE REPAIR STERNAL     for infection s/p CABG 8/10  . REPLACEMENT TOTAL KNEE Bilateral   . THYROIDECTOMY    . TOTAL HIP ARTHROPLASTY Right   . TOTAL HIP ARTHROPLASTY Left 12/03/2013   Procedure: LEFT TOTAL HIP ARTHROPLASTY ANTERIOR APPROACH;  Surgeon: Shelda Pal, MD;  Location: [facility];  Service: Orthopedics;  Laterality: Left;  . TUBAL LIGATION    . WRIST SURGERY Left    "tumor taken off" (09/30/2013)    Social History   Social History  . Marital status: Married    Spouse name: N/A  . Number of children: 5  . Years of education: N/A   Occupational History  .  Retired   Social History Main Topics  . Smoking status: Never Smoker  . Smokeless tobacco: Former Neurosurgeon    Types: Snuff     Comment: 09/30/2013 "stopped snuff a long time ago"  . Alcohol use No  . Drug use: No  . Sexual activity: No   Other Topics Concern  . Not on file   Social History Narrative   Lives at home with daughter, son-in-law   Caffeine use-  none    Allergies  Allergen Reactions  . Codeine     headache  . Other Nausea And Vomiting    Leafy raw vegetables and seeds  . Oxycodone Hcl      headache; also OxyContin  . Peanuts [Peanut Oil] Nausea And Vomiting  . Reglan [Metoclopramide]     CONFUSION    Family History  Problem Relation Age of Onset  . Heart disease Mother     MI  . Colon cancer Father     age 32  . Diabetes    . Coronary artery disease      Female <55  . Cancer      Prior to Admission medications   Medication Sig Start Date End Date Taking? Authorizing Provider    apixaban (ELIQUIS) 5 MG TABS tablet Take 5 mg by mouth 2 (two) times daily.    Yes Historical Provider, MD  atorvastatin (LIPITOR) 40 MG tablet Take 1 tablet (40 mg total) by mouth daily. Patient taking differently: Take 40 mg by mouth every evening.  09/10/14  Yes Antoine Poche,  MD  benazepril (LOTENSIN) 40 MG tablet Take 40 mg by mouth every morning.   Yes Historical Provider, MD  docusate sodium (COLACE) 100 MG capsule Take 100 mg by mouth daily.   Yes Historical Provider, MD  ferrous sulfate 325 (65 FE) MG tablet Take 325 mg by mouth daily with breakfast.   Yes Historical Provider, MD  ketorolac (ACULAR) 0.5 % ophthalmic solution Place 1 drop into the right eye 3 (three) times daily.   Yes Historical Provider, MD  levETIRAcetam (KEPPRA) 500 MG tablet Take 500 mg by mouth 2 (two) times daily.   Yes Historical Provider, MD  lubiprostone (AMITIZA) 24 MCG capsule Take 24 mcg by mouth 2 (two) times daily with a meal.   Yes Historical Provider, MD  metFORMIN (GLUCOPHAGE) 500 MG tablet Take 500 mg by mouth 2 (two) times daily with a meal.    Yes Historical Provider, MD  metoprolol tartrate (LOPRESSOR) 25 MG tablet Take 25 mg by mouth 2 (two) times daily.    Yes Historical Provider, MD  omega-3 acid ethyl esters (LOVAZA) 1 G capsule Take 2 g by mouth 2 (two) times daily.    Yes Historical Provider, MD  potassium chloride (K-DUR,KLOR-CON) 10 MEQ tablet Take 10 mEq by mouth daily.   Yes Historical Provider, MD  traZODone (DESYREL) 50 MG tablet Take 50 mg by mouth at bedtime.   Yes Historical Provider, MD  acetaminophen (TYLENOL) 325 MG tablet Take 325-650 mg by mouth daily as needed for mild pain or moderate pain.     Historical Provider, MD  nitroGLYCERIN (NITROSTAT) 0.4 MG SL tablet Place 1 tablet (0.4 mg total) under the tongue every 5 (five) minutes as needed for chest pain. 05/20/14   Antoine Poche, MD  ondansetron (ZOFRAN ODT) 4 MG disintegrating tablet Take 1 tablet (4 mg total) by mouth  every 8 (eight) hours as needed. 08/09/16   Zadie Rhine, MD  pantoprazole (PROTONIX) 40 MG tablet Take 1 tablet (40 mg total) by mouth daily. Patient taking differently: Take 40 mg by mouth daily as needed. Acid reflux 12/03/13   Gelene Mink, NP    Physical Exam: Vitals:   08/29/16 1538 08/29/16 1730 08/29/16 1830 08/29/16 2031  BP: 116/72 (!) 147/104 (!) 146/77 (!) 188/89  Pulse: 65 61 67 75  Resp: 15 18 18 18   Temp:   97.7 F (36.5 C) 98.3 F (36.8 C)  TempSrc:   Oral Oral  SpO2: 96% 96% 97% 97%  Weight:   84.3 kg (185 lb 13.6 oz)   Height:   5\' 2"  (1.575 m)      General:  Appears calm and comfortable and is NAD Eyes:  PERRL, EOMI, normal lids, iris ENT:  grossly normal hearing, lips & tongue, mmm Neck:  no LAD, masses or thyromegaly Cardiovascular:  RRR, no m/r/g. No LE edema.  Respiratory:  CTA bilaterally, no w/r/r. Normal respiratory effort. Abdomen:  soft, ntnd, NABS Skin:  no rash or induration seen on limited exam Musculoskeletal:  grossly normal tone BUE/BLE, good ROM, no bony abnormality Psychiatric:  grossly normal mood and affect, speech fluent and appropriate, AOx3 Neurologic:  CN 2-12 grossly intact, moves all extremities in coordinated fashion, sensation intact  Labs on Admission: I have personally reviewed following labs and imaging studies  CBC:  Recent Labs Lab 08/29/16 1229  WBC 7.4  HGB 10.1*  HCT 32.2*  MCV 81.1  PLT 210   Basic Metabolic Panel:  Recent Labs Lab 08/29/16 1229  NA  136  K 3.5  CL 108  CO2 22  GLUCOSE 107*  BUN 26*  CREATININE 1.61*  CALCIUM 9.6   GFR: Estimated Creatinine Clearance: 29.5 mL/min (by C-G formula based on SCr of 1.61 mg/dL (H)). Liver Function Tests:  Recent Labs Lab 08/29/16 1229  AST 19  ALT 14  ALKPHOS 53  BILITOT 0.3  PROT 7.0  ALBUMIN 3.2*   No results for input(s): LIPASE, AMYLASE in the last 168 hours. No results for input(s): AMMONIA in the last 168 hours. Coagulation Profile: No  results for input(s): INR, PROTIME in the last 168 hours. Cardiac Enzymes:  Recent Labs Lab 08/29/16 1500  TROPONINI <0.03   BNP (last 3 results) No results for input(s): PROBNP in the last 8760 hours. HbA1C: No results for input(s): HGBA1C in the last 72 hours. CBG: No results for input(s): GLUCAP in the last 168 hours. Lipid Profile: No results for input(s): CHOL, HDL, LDLCALC, TRIG, CHOLHDL, LDLDIRECT in the last 72 hours. Thyroid Function Tests: No results for input(s): TSH, T4TOTAL, FREET4, T3FREE, THYROIDAB in the last 72 hours. Anemia Panel: No results for input(s): VITAMINB12, FOLATE, FERRITIN, TIBC, IRON, RETICCTPCT in the last 72 hours. Urine analysis:    Component Value Date/Time   COLORURINE YELLOW 08/09/2016 2123   APPEARANCEUR CLEAR 08/09/2016 2123   LABSPEC 1.015 08/09/2016 2123   PHURINE 5.5 08/09/2016 2123   GLUCOSEU NEGATIVE 08/09/2016 2123   HGBUR NEGATIVE 08/09/2016 2123   BILIRUBINUR NEGATIVE 08/09/2016 2123   KETONESUR NEGATIVE 08/09/2016 2123   PROTEINUR NEGATIVE 08/09/2016 2123   UROBILINOGEN 0.2 02/12/2015 0951   NITRITE NEGATIVE 08/09/2016 2123   LEUKOCYTESUR NEGATIVE 08/09/2016 2123    Creatinine Clearance: Estimated Creatinine Clearance: 29.5 mL/min (by C-G formula based on SCr of 1.61 mg/dL (H)).  Sepsis Labs: @LABRCNTIP (procalcitonin:4,lacticidven:4) )No results found for this or any previous visit (from the past 240 hour(s)).   Radiological Exams on Admission: Dg Chest 2 View  Result Date: 08/29/2016 CLINICAL DATA:  Severe substernal chest pain today with shortness of breath and diaphoresis. EXAM: CHEST  2 VIEW COMPARISON:  08/09/2016 and 02/12/2015. FINDINGS: Stable cardiomegaly and aortic atherosclerosis post CABG. Left subclavian Port-A-Cath tip is unchanged in the mid SVC. There are right subclavian pacemaker leads in the right atrium and right ventricle. The lungs are clear. There is no pleural effusion or pneumothorax. No acute  osseous findings are seen. There are mild degenerative changes throughout the spine associated with a scoliosis. IMPRESSION: Stable postoperative chest.  Mild cardiomegaly post CABG. Electronically Signed   By: Carey Bullocks M.D.   On: 08/29/2016 12:24    EKG: Independently reviewed.  Afib with rate 70; nonspecific ST changes with T wave inversions in the inferolateral leads   Assessment/Plan Principal Problem:   Chest pain Active Problems:   Chronic diastolic heart failure (HCC)   Diabetes mellitus, type II (HCC)   Hypertension   Arteriosclerotic cardiovascular disease (ASCVD)   Gastroesophageal reflux disease   Hyperlipidemia   COPD (chronic obstructive pulmonary disease) (HCC)   Chronic kidney disease (CKD), stage III (moderate)   Atrial fibrillation (HCC)   Chest pain -Patient with right-sided/substernal chest pain that came on with exertion and improved following NTG x 3 and ASA. -Known h/o ASCVD, s/p CABG (remote) without recent stress testing -2/3 vs. 3/3 typical symptoms suggestive of atypical or typical cardiac chest pain.  -CXR unremarkable.   -Initial cardiac enzymes negative.   -EKG with T wave inversions.   -TIMI risk score is 5; which predicts  a 14 days risk of death, recurrent MI, or urgent revascularization of 26.2%.  -Will plan to place in observation status on telemetry to rule out ACS by overnight observation.  -cycle cardiac enzymes q6h x 3 and repeat EKG in AM -ASA PO daily -morphine given  -Continue statin (Lipitor 40) -takes Eliquis daily -Risk factor stratification with FLP and HgbA1c; will also check TSH -MPS in AM -Patient seen by Dr. Purvis SheffieldKoneswaran in the ER -If ongoing chest pain, consider adding long-acting nitrates as per cardiology  CHF -Last reported echo was in 7/14 with preserved EF and grade 1 diastolic dysfunction -Currently compensated  DM -last A1c was in 11/15 (by EHR) and was 6.2 -Will check A1c -Hold Glucophage -Will order  SSI  HTN -Continue Lotensin and Lopressor -Monitor BP  HLD -Continue Lipitor 40 mg for now -Check FLP  Afib -On Eliquis for anticoagulation -Rate controlled -Has pacemaker  CKD -Appears to be at/better than usual baseline   DVT prophylaxis: Eliquis Code Status: Full - confirmed with patient Family Communication: None present Disposition Plan:  Home once clinically improved Consults called: Cardiology  Admission status: Observation, telemetry    Jonah BlueJennifer Priscilla Kirstein MD [facility]  If 7PM-7AM, please contact night-coverage [email or web address] Password [email or web address]  08/29/2016, 8:49 PM

## 2016-08-29 NOTE — Consult Note (Signed)
CARDIOLOGY CONSULT NOTE  Patient ID: RAMONICA GRIGG MRN: [id number] DOB/AGE: 04/15/1941 75 y.o.  Admit date: 08/29/2016 Primary Physician: Fredirick Maudlin, MD Referring Physician: Estell Harpin MD  Reason for Consultation: chest pain, abnormal ECG  HPI: Mrs. [place] is a 75 year old woman with chronic diastolic heart failure, coronary artery disease, status post bypass surgery, COPD on nighttime home oxygen, symptomatic bradycardia, status post permanent pacemaker insertion, atrial fibrillation, obesity, and chronic arthritis.  She presents with chest pain. Initial POC troponin is normal.  ECG shows NSR with inferior TWI's and in V3-6.  Last nuclear stress test on 10/01/13 was normal.  Echocardiogram 07/01/13 Normal LV systolic function, EF 60-65%, mild LVH, grade 1 diastolic dysfunction.  Chest xray without pulmonary edema.  Chest pain began this morning while sitting in a chair. She had been walking and then experienced the sudden onset of dizziness. She grabbed the chair but ultimately fell. Uncertain if she lost consciousness, if so only for a few seconds. Took 2 SL nitro and then called 911. Denies associated shortness of breath. Currently free of chest pain. Denies chest pain in recent months since appt with Dr. Ladona Ridgel in June.     Allergies  Allergen Reactions  . Codeine     headache  . Other Nausea And Vomiting    Leafy raw vegetables and seeds  . Oxycodone Hcl      headache; also OxyContin  . Peanuts [Peanut Oil] Nausea And Vomiting  . Reglan [Metoclopramide]     CONFUSION    Current Facility-Administered Medications  Medication Dose Route Frequency Provider Last Rate Last Dose  . nitroGLYCERIN (NITROSTAT) SL tablet 0.4 mg  0.4 mg Sublingual Q5 min PRN Bethann Berkshire, MD   0.4 mg at 08/29/16 1421  . sodium chloride 0.9 % bolus 500 mL  500 mL Intravenous Once Bethann Berkshire, MD       Current Outpatient Prescriptions  Medication Sig Dispense Refill  . apixaban  (ELIQUIS) 5 MG TABS tablet Take 5 mg by mouth 2 (two) times daily.     Marland Kitchen atorvastatin (LIPITOR) 40 MG tablet Take 1 tablet (40 mg total) by mouth daily. (Patient taking differently: Take 40 mg by mouth every evening. ) 30 tablet 0  . benazepril (LOTENSIN) 40 MG tablet Take 40 mg by mouth every morning.    . docusate sodium (COLACE) 100 MG capsule Take 100 mg by mouth daily.    . ferrous sulfate 325 (65 FE) MG tablet Take 325 mg by mouth daily with breakfast.    . ketorolac (ACULAR) 0.5 % ophthalmic solution Place 1 drop into the right eye 3 (three) times daily.    Marland Kitchen levETIRAcetam (KEPPRA) 500 MG tablet Take 500 mg by mouth 2 (two) times daily.    Marland Kitchen lubiprostone (AMITIZA) 24 MCG capsule Take 24 mcg by mouth 2 (two) times daily with a meal.    . metFORMIN (GLUCOPHAGE) 500 MG tablet Take 500 mg by mouth 2 (two) times daily with a meal.     . metoprolol tartrate (LOPRESSOR) 25 MG tablet Take 25 mg by mouth 2 (two) times daily.     Marland Kitchen omega-3 acid ethyl esters (LOVAZA) 1 G capsule Take 2 g by mouth 2 (two) times daily.     . potassium chloride (K-DUR,KLOR-CON) 10 MEQ tablet Take 10 mEq by mouth daily.    . traZODone (DESYREL) 50 MG tablet Take 50 mg by mouth at bedtime.    Marland Kitchen acetaminophen (TYLENOL) 325 MG tablet Take  325-650 mg by mouth daily as needed for mild pain or moderate pain.     . nitroGLYCERIN (NITROSTAT) 0.4 MG SL tablet Place 1 tablet (0.4 mg total) under the tongue every 5 (five) minutes as needed for chest pain. 25 tablet 1  . ondansetron (ZOFRAN ODT) 4 MG disintegrating tablet Take 1 tablet (4 mg total) by mouth every 8 (eight) hours as needed. 4 tablet 0  . pantoprazole (PROTONIX) 40 MG tablet Take 1 tablet (40 mg total) by mouth daily. (Patient taking differently: Take 40 mg by mouth daily as needed. Acid reflux) 30 tablet 3    Past Medical History:  Diagnosis Date  . Anemia   . Anginal pain (HCC)    "all the time, but not heart related"  . Arthritis    "legs and back"  (09/30/2013)  . Asthma    years ago  . 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 Blurred vision, bilateral    sometimes double vision  . CAD (coronary artery disease)    multivessel, CABG 6/10.  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  Myoview (11/20/11) showed no ischemia & EF 62%  . Chronic lower back pain   . COPD (chronic obstructive pulmonary disease) (HCC)    severe lung disease by PFTs 6/12  . DDD (degenerative disc disease)   . Deep vein thrombosis, upper right extremity (HCC)    post-PPM  . Dehiscence of closure of sternum or sternotomy 07/2009   sternal infection; required flap closure  . Diabetes mellitus, type II (HCC)   . Gastroesophageal reflux disease    not much  . H/O hiatal hernia   . History of kidney stones   . Hyperlipidemia   . Hypertension   . Migraines    "years ago" (09/30/2013)  . Myocardial infarction (HCC)    years ago  . Obesity   . Orthopnea    sometimes  . Pacemaker   . PUD (peptic ulcer disease)   . Seizure disorder (HCC)   . Seizures (HCC)    "long time ago; don't remember what they were related to" (09/30/2013)none recent  . Sleep apnea    denies CPAP use on 09/30/2013  . Stroke [facility](HCC)    years ago, "residual on left back"  . Urge incontinence of urine     Past Surgical History:  Procedure Laterality Date  . ABDOMINAL HYSTERECTOMY     partial  . APPENDECTOMY    . BACK SURGERY     x3 total  . CESAREAN SECTION  ~1980  . CHOLECYSTECTOMY    . COLONOSCOPY  04/27/2005   hemorrhoids, diverticula  . COLONOSCOPY WITH ESOPHAGOGASTRODUODENOSCOPY (EGD) N/A 02/20/2013   RMR: pancolonic diverticulosis and internal hemorrhoids. EGD with small hiatal hernia, healed gastric ulcer  . CORONARY ARTERY BYPASS GRAFT  05/1999   - LIMA to LAD, SVG to OM, SVG to PDA  . ESOPHAGOGASTRODUODENOSCOPY     [facility],  biopsy, and disruption Schatzki's ring  . ESOPHAGOGASTRODUODENOSCOPY  09/10/2012   RMR: Noncritical  Schatzki's ring. Diffuse gastric erosions and an  area of partially healing ulceration-status post biopsy/ Small hiatal hernia. Bx reactive gastropathy.  . INSERT / REPLACE / REMOVE PACEMAKER  1993  . JOINT REPLACEMENT    . LUMBAR DISC SURGERY    . PACEMAKER PLACEMENT  1993   Status post DDD pacemaker implantation in 1993, with Medtronic Kappa generator change in 2003 for  second-degree AV block, most recent gen change by  JA 04/14/11  . PORTACATH PLACEMENT Left 07/17/2013   Procedure: INSERTION PORT-A-CATH-left subclavian;  Surgeon: Fabio Bering, MD;  Location: [facility];  Service: General;  Laterality: Left;  . POSTERIOR LUMBAR FUSION    . RECONSTRUCTIVE REPAIR STERNAL     for infection s/p CABG 8/10  . REPLACEMENT TOTAL KNEE Bilateral   . THYROIDECTOMY    . TOTAL HIP ARTHROPLASTY Right   . TOTAL HIP ARTHROPLASTY Left 12/03/2013   Procedure: LEFT TOTAL HIP ARTHROPLASTY ANTERIOR APPROACH;  Surgeon: Shelda Pal, MD;  Location: [facility];  Service: Orthopedics;  Laterality: Left;  . TUBAL LIGATION    . WRIST SURGERY Left    "tumor taken off" (09/30/2013)    Social History   Social History  . Marital status: Married    Spouse name: N/A  . Number of children: 5  . Years of education: N/A   Occupational History  .  Retired   Social History Main Topics  . Smoking status: Never Smoker  . Smokeless tobacco: Former Neurosurgeon    Types: Snuff     Comment: 09/30/2013 "stopped snuff a long time ago"  . Alcohol use No  . Drug use: No  . Sexual activity: No   Other Topics Concern  . Not on file   Social History Narrative   Lives at home with daughter, son-in-law   Caffeine use-  none     No family history of premature CAD in 1st degree relatives.  Prior to Admission medications   Medication Sig Start Date End Date Taking? Authorizing Provider  apixaban (ELIQUIS) 5 MG TABS tablet Take 5 mg by mouth 2 (two) times daily.    Yes Historical Provider, MD  atorvastatin (LIPITOR) 40 MG  tablet Take 1 tablet (40 mg total) by mouth daily. Patient taking differently: Take 40 mg by mouth every evening.  09/10/14  Yes Antoine Poche, MD  benazepril (LOTENSIN) 40 MG tablet Take 40 mg by mouth every morning.   Yes Historical Provider, MD  docusate sodium (COLACE) 100 MG capsule Take 100 mg by mouth daily.   Yes Historical Provider, MD  ferrous sulfate 325 (65 FE) MG tablet Take 325 mg by mouth daily with breakfast.   Yes Historical Provider, MD  ketorolac (ACULAR) 0.5 % ophthalmic solution Place 1 drop into the right eye 3 (three) times daily.   Yes Historical Provider, MD  levETIRAcetam (KEPPRA) 500 MG tablet Take 500 mg by mouth 2 (two) times daily.   Yes Historical Provider, MD  lubiprostone (AMITIZA) 24 MCG capsule Take 24 mcg by mouth 2 (two) times daily with a meal.   Yes Historical Provider, MD  metFORMIN (GLUCOPHAGE) 500 MG tablet Take 500 mg by mouth 2 (two) times daily with a meal.    Yes Historical Provider, MD  metoprolol tartrate (LOPRESSOR) 25 MG tablet Take 25 mg by mouth 2 (two) times daily.    Yes Historical Provider, MD  omega-3 acid ethyl esters (LOVAZA) 1 G capsule Take 2 g by mouth 2 (two) times daily.    Yes Historical Provider, MD  potassium chloride (K-DUR,KLOR-CON) 10 MEQ tablet Take 10 mEq by mouth daily.   Yes Historical Provider, MD  traZODone (DESYREL) 50 MG tablet Take 50 mg by mouth at bedtime.   Yes Historical Provider, MD  acetaminophen (TYLENOL) 325 MG tablet Take 325-650 mg by mouth daily as needed for mild pain or moderate pain.     Historical Provider, MD  nitroGLYCERIN (NITROSTAT) 0.4 MG SL tablet  Place 1 tablet (0.4 mg total) under the tongue every 5 (five) minutes as needed for chest pain. 05/20/14   Antoine Poche, MD  ondansetron (ZOFRAN ODT) 4 MG disintegrating tablet Take 1 tablet (4 mg total) by mouth every 8 (eight) hours as needed. 08/09/16   Zadie Rhine, MD  pantoprazole (PROTONIX) 40 MG tablet Take 1 tablet (40 mg total) by mouth  daily. Patient taking differently: Take 40 mg by mouth daily as needed. Acid reflux 12/03/13   Gelene Mink, NP     Review of systems complete and found to be negative unless listed above in HPI     Physical exam Blood pressure 113/74, pulse 75, temperature 98.6 F (37 C), resp. rate 18, height 5\' 2"  (1.575 m), weight 240 lb (108.9 kg), SpO2 96 %. General: NAD Neck: No JVD, no thyromegaly or thyroid nodule.  Lungs: Clear to auscultation bilaterally with normal respiratory effort. CV: Nondisplaced PMI. Regular rate and irregular rhythm, normal S1/S2, no S3, no murmur.  No peripheral edema.  Abdomen: Soft, nontender, obese, no distention.  Skin: Intact without lesions or rashes.  Neurologic: Alert and oriented x 3.  Psych: Normal affect. Extremities: No clubbing or cyanosis.  HEENT: Normal.   ECG: Most recent ECG reviewed.  Labs:   Lab Results  Component Value Date   WBC 7.4 08/29/2016   HGB 10.1 (L) 08/29/2016   HCT 32.2 (L) 08/29/2016   MCV 81.1 08/29/2016   PLT 210 08/29/2016    Recent Labs Lab 08/29/16 1229  NA 136  K 3.5  CL 108  CO2 22  BUN 26*  CREATININE 1.61*  CALCIUM 9.6  PROT 7.0  BILITOT 0.3  ALKPHOS 53  ALT 14  AST 19  GLUCOSE 107*   Lab Results  Component Value Date   CKTOTAL 42 03/24/2012   CKMB 1.5 03/24/2012   TROPONINI <0.30 10/10/2014    Lab Results  Component Value Date   CHOL 123 02/19/2014   CHOL 241 (H) 11/05/2012   CHOL 231 (H) 03/24/2012   Lab Results  Component Value Date   HDL 41 02/19/2014   HDL 40 11/05/2012   HDL 54 03/24/2012   Lab Results  Component Value Date   LDLCALC 69 02/19/2014   LDLCALC 174 (H) 11/05/2012   LDLCALC 143 (H) 03/24/2012   Lab Results  Component Value Date   TRIG 67 02/19/2014   TRIG 136 11/05/2012   TRIG 170 (H) 03/24/2012   Lab Results  Component Value Date   CHOLHDL 3.0 02/19/2014   CHOLHDL 6.0 11/05/2012   CHOLHDL 4.3 03/24/2012   No results found for: LDLDIRECT        Studies: Dg Chest 2 View  Result Date: 08/29/2016 CLINICAL DATA:  Severe substernal chest pain today with shortness of breath and diaphoresis. EXAM: CHEST  2 VIEW COMPARISON:  08/09/2016 and 02/12/2015. FINDINGS: Stable cardiomegaly and aortic atherosclerosis post CABG. Left subclavian Port-A-Cath tip is unchanged in the mid SVC. There are right subclavian pacemaker leads in the right atrium and right ventricle. The lungs are clear. There is no pleural effusion or pneumothorax. No acute osseous findings are seen. There are mild degenerative changes throughout the spine associated with a scoliosis. IMPRESSION: Stable postoperative chest.  Mild cardiomegaly post CABG. Electronically Signed   By: Carey Bullocks M.D.   On: 08/29/2016 12:24    ASSESSMENT AND PLAN:  1. Chest pain in context of CAD/CABG: Would recommend checking serial serum troponins. Consider adding long-acting nitrates should  pain recur. Does have diffuse TWI's. Would recommend outpatient stress testing if no ACS. Continue Lipitor and metoprolol.  2. Atrial fibrillation, chronic: Rate controlled on metoprolol. Continue Eliquis for anticoagulation.  3. HTN: Controlled. No changes.  4. Pacemaker: Normal function in 05/2016. Follows with Dr. Ladona Ridgel.   Signed: Prentice Docker, M.D., F.A.C.C.  08/29/2016, 2:46 PM

## 2016-08-29 NOTE — ED Notes (Signed)
Pt stable and ready for transport to [facility], rm 302.  Report called to RoslynLatoya, RN

## 2016-08-29 NOTE — ED Triage Notes (Signed)
Pt reports substernal chest pain 10/10 score with onset this morning after sweeping front porch.  Pt says she fell when pain "hit her" then she took two nitro pills at home with some relief.  Pt says she was treated for same 1 week ago in this ED.  EMS state pt pacemaker initiated several times while in transit.

## 2016-08-29 NOTE — ED Provider Notes (Signed)
[facility]-EM[facility]Y DEPT Provider Note   CSN: [id number] Arrival date & time: 08/29/16  1054  By signing my name below, I, Placido Sou, attest that this documentation has been prepared under the direction and in the presence of Bethann Berkshire, MD. Electronically Signed: Placido Sou, ED Scribe. 08/29/16. 12:12 PM.   History   Chief Complaint Chief Complaint  Patient presents with  . Chest Pain    HPI HPI Comments: [name] is a 75 y.o. female with a h/o angina, CAD, HTN and MI who presents to the Emergency Department by ambulance complaining of intermittent, moderate, substernal CP onset PTA. She states her pain began while she was sitting and lasted for about 30 minutes with associated diaphoresis and SOB. Pt reports a h/o CP noting that she was evaluated recently for CP which she states is consistent with the CP she experienced today. Pt denies current CP or other associated symptoms at this time.   Patient complains of chest pain with sleeping today   The history is provided by the patient. No language interpreter was used.  Chest Pain   This is a recurrent problem. The current episode started 1 to 2 hours ago. The problem occurs every several days. The problem has been resolved. Duration of episode(s) is 30 minutes. Associated symptoms include diaphoresis and shortness of breath. Pertinent negatives include no abdominal pain, no back pain, no cough and no headaches. She has tried nothing for the symptoms. The treatment provided no relief. Risk factors include being elderly.  Her past medical history is significant for CAD.  Pertinent negatives for past medical history include no seizures.    Past Medical History:  Diagnosis Date  . Anemia   . Anginal pain (HCC)    "all the time, but not heart related"  . Arthritis    "legs and back" (09/30/2013)  . Asthma    years ago  . 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 Blurred  vision, bilateral    sometimes double vision  . CAD (coronary artery disease)    multivessel, CABG 6/10.  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  Myoview (11/20/11) showed no ischemia & EF 62%  . Chronic lower back pain   . COPD (chronic obstructive pulmonary disease) (HCC)    severe lung disease by PFTs 6/12  . DDD (degenerative disc disease)   . Deep vein thrombosis, upper right extremity (HCC)    post-PPM  . Dehiscence of closure of sternum or sternotomy 07/2009   sternal infection; required flap closure  . Diabetes mellitus, type II (HCC)   . Gastroesophageal reflux disease    not much  . H/O hiatal hernia   . History of kidney stones   . Hyperlipidemia   . Hypertension   . Migraines    "years ago" (09/30/2013)  . Myocardial infarction (HCC)    years ago  . Obesity   . Orthopnea    sometimes  . Pacemaker   . PUD (peptic ulcer disease)   . Seizure disorder (HCC)   . Seizures (HCC)    "long time ago; don't remember what they were related to" (09/30/2013)none recent  . Sleep apnea    denies CPAP use on 09/30/2013  . Stroke [facility])    years ago, "residual on left back"  . Urge incontinence of urine     Patient Active Problem List   Diagnosis Date Noted  . Atrial fibrillation (HCC) 02/06/2015  . Encephalopathy, metabolic 10/12/2014  .  Intractable nausea and vomiting 10/09/2014  . Epigastric abdominal pain 10/09/2014  . Expected blood loss anemia 12/04/2013  . Obese 12/04/2013  . S/P left THA, AA 12/03/2013  . Preop cardiovascular exam 10/30/2013  . Protein-calorie malnutrition, severe (HCC) 10/02/2013  . Precordial pain 09/30/2013  . Failure to thrive 07/26/2013  . Anemia 07/13/2013  . Altered mental state 07/13/2013  . NSTEMI (non-ST elevated myocardial infarction) (HCC) 07/01/2013  . Acute delirium 06/29/2013  . Fecal impaction (HCC) 06/29/2013  . Total knee replacement status 05/22/2013  . H/O total hip arthroplasty 05/22/2013  . Spinal stenosis, lumbar region, with neurogenic  claudication 05/22/2013  . Osteoarthritis of left hip 05/22/2013  . Noninfectious gastroenteritis and colitis 03/05/2013  . Dehydration 03/05/2013  . Family history of colon cancer 02/15/2013  . PUD (peptic ulcer disease) 02/15/2013  . Diabetes mellitus, type II (HCC)   . Hypertension   . Arteriosclerotic cardiovascular disease (ASCVD)   . Gastroesophageal reflux disease   . Hyperlipidemia   . Deep vein thrombosis, upper right extremity (HCC)   . Obesity   . AV block   . COPD (chronic obstructive pulmonary disease) (HCC)   . Sleep apnea   . Seizure disorder (HCC)   . Anemia, normocytic normochromic 12/15/2011  . Chronic diastolic heart failure (HCC) 11/18/2011  . PACEMAKER, PERMANENT 05/13/2009  . 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 GASTRIC ULCER 05/12/2009  . Gastroparesis 05/12/2009  . DIVERTICULAR DISEASE 05/12/2009    Past Surgical History:  Procedure Laterality Date  . ABDOMINAL HYSTERECTOMY     partial  . APPENDECTOMY    . BACK SURGERY     x3 total  . CESAREAN SECTION  ~1980  . CHOLECYSTECTOMY    . COLONOSCOPY  04/27/2005   hemorrhoids, diverticula  . COLONOSCOPY WITH ESOPHAGOGASTRODUODENOSCOPY (EGD) N/A 02/20/2013   RMR: pancolonic diverticulosis and internal hemorrhoids. EGD with small hiatal hernia, healed gastric ulcer  . CORONARY ARTERY BYPASS GRAFT  05/1999   - LIMA to LAD, SVG to OM, SVG to PDA  . ESOPHAGOGASTRODUODENOSCOPY     with [facility] dilation,  biopsy, and disruption Schatzki's ring  . ESOPHAGOGASTRODUODENOSCOPY  09/10/2012   RMR: Noncritical Schatzki's ring. Diffuse gastric erosions and an  area of partially healing ulceration-status post biopsy/ Small hiatal hernia. Bx reactive gastropathy.  . INSERT / REPLACE / REMOVE PACEMAKER  1993  . JOINT REPLACEMENT    . LUMBAR DISC SURGERY    . PACEMAKER PLACEMENT  1993   Status post DDD pacemaker implantation in 1993, with Medtronic Kappa generator change in 2003 for  second-degree AV block, most recent gen change  by Fawn Kirk 04/14/11  . PORTACATH PLACEMENT Left 07/17/2013   Procedure: INSERTION PORT-A-CATH-left subclavian;  Surgeon: Fabio Bering, MD;  Location: [facility] ORS;  Service: General;  Laterality: Left;  . POSTERIOR LUMBAR FUSION    . RECONSTRUCTIVE REPAIR STERNAL     for infection s/p CABG 8/10  . REPLACEMENT TOTAL KNEE Bilateral   . THYROIDECTOMY    . TOTAL HIP ARTHROPLASTY Right   . TOTAL HIP ARTHROPLASTY Left 12/03/2013   Procedure: LEFT TOTAL HIP ARTHROPLASTY ANTERIOR APPROACH;  Surgeon: Shelda Pal, MD;  Location: [facility];  Service: Orthopedics;  Laterality: Left;  . TUBAL LIGATION    . WRIST SURGERY Left    "tumor taken off" (09/30/2013)    OB History    No data available       Home Medications    Prior to Admission medications   Medication Sig Start Date End  Date Taking? Authorizing Provider  acetaminophen (TYLENOL) 325 MG tablet Take 325-650 mg by mouth daily as needed for mild pain or moderate pain.     Historical Provider, MD  apixaban (ELIQUIS) 5 MG TABS tablet Take 5 mg by mouth 2 (two) times daily.     Historical Provider, MD  aspirin 81 MG tablet Take 81 mg by mouth daily.    Historical Provider, MD  atorvastatin (LIPITOR) 40 MG tablet Take 1 tablet (40 mg total) by mouth daily. Patient taking differently: Take 40 mg by mouth every evening.  09/10/14   Antoine Poche, MD  benazepril (LOTENSIN) 40 MG tablet Take 40 mg by mouth every morning.    Historical Provider, MD  docusate sodium (COLACE) 100 MG capsule Take 100 mg by mouth daily.    Historical Provider, MD  ferrous sulfate 325 (65 FE) MG tablet Take 325 mg by mouth daily with breakfast.    Historical Provider, MD  ketorolac (ACULAR) 0.5 % ophthalmic solution Place 1 drop into the right eye 3 (three) times daily.    Historical Provider, MD  levETIRAcetam (KEPPRA) 500 MG tablet Take 500 mg by mouth 2 (two) times daily.    Historical Provider, MD  lubiprostone (AMITIZA) 24 MCG capsule Take 24 mcg by mouth 2 (two) times  daily with a meal.    Historical Provider, MD  metFORMIN (GLUCOPHAGE) 500 MG tablet Take 500 mg by mouth 2 (two) times daily with a meal.     Historical Provider, MD  metoprolol tartrate (LOPRESSOR) 25 MG tablet Take 25 mg by mouth 2 (two) times daily.     Historical Provider, MD  nitroGLYCERIN (NITROSTAT) 0.4 MG SL tablet Place 1 tablet (0.4 mg total) under the tongue every 5 (five) minutes as needed for chest pain. 05/20/14   Antoine Poche, MD  omega-3 acid ethyl esters (LOVAZA) 1 G capsule Take 2 g by mouth 2 (two) times daily.     Historical Provider, MD  ondansetron (ZOFRAN ODT) 4 MG disintegrating tablet Take 1 tablet (4 mg total) by mouth every 8 (eight) hours as needed. 08/09/16   Zadie Rhine, MD  pantoprazole (PROTONIX) 40 MG tablet Take 1 tablet (40 mg total) by mouth daily. 12/03/13   Gelene Mink, NP  potassium chloride (K-DUR,KLOR-CON) 10 MEQ tablet Take 10 mEq by mouth daily.    Historical Provider, MD  traZODone (DESYREL) 50 MG tablet Take 50 mg by mouth at bedtime.    Historical Provider, MD    Family History Family History  Problem Relation Age of Onset  . Heart disease Mother     MI  . Colon cancer Father     age 57  . Diabetes    . Coronary artery disease      Female <55  . Cancer      Social History Social History  Substance Use Topics  . Smoking status: Never Smoker  . Smokeless tobacco: Former Neurosurgeon    Types: Snuff     Comment: 09/30/2013 "stopped snuff a long time ago"  . Alcohol use No     Allergies   Codeine; Other; Oxycodone hcl; Peanuts [peanut oil]; and Reglan [metoclopramide]   Review of Systems Review of Systems  Constitutional: Positive for diaphoresis. Negative for appetite change and fatigue.  HENT: Negative for congestion, ear discharge and sinus pressure.   Eyes: Negative for discharge.  Respiratory: Positive for shortness of breath. Negative for cough.   Cardiovascular: Positive for chest pain.  Gastrointestinal: Negative  for  abdominal pain and diarrhea.  Genitourinary: Negative for frequency and hematuria.  Musculoskeletal: Negative for back pain.  Skin: Negative for rash.  Neurological: Negative for seizures and headaches.  Psychiatric/Behavioral: Negative for hallucinations.   Physical Exam Updated Vital Signs BP 128/78 (BP Location: Left Arm)   Pulse 78   Temp 98.6 F (37 C)   Resp 23   Ht 5\' 2"  (1.575 m)   Wt 240 lb (108.9 kg)   SpO2 97%   BMI 43.90 kg/m   Physical Exam  Constitutional: She is oriented to person, place, and time. She appears well-developed.  HENT:  Head: Normocephalic.  Eyes: Conjunctivae and EOM are normal. No scleral icterus.  Neck: Neck supple. No thyromegaly present.  Cardiovascular: Normal rate and regular rhythm.  Exam reveals no gallop and no friction rub.   No murmur heard. Pulmonary/Chest: No stridor. She has no wheezes. She has no rales. She exhibits no tenderness.  Abdominal: She exhibits no distension. There is no tenderness. There is no rebound.  Musculoskeletal: Normal range of motion. She exhibits no edema.  Lymphadenopathy:    She has no cervical adenopathy.  Neurological: She is oriented to person, place, and time. She exhibits normal muscle tone. Coordination normal.  Skin: No rash noted. No erythema.  Psychiatric: She has a normal mood and affect. Her behavior is normal.   ED Treatments / Results  Labs (all labs ordered are listed, but only abnormal results are displayed) Labs Reviewed  BASIC METABOLIC PANEL - Abnormal; Notable for the following:       Result Value   Glucose, Bld 107 (*)    BUN 26 (*)    Creatinine, Ser 1.61 (*)    GFR calc non Af Amer 30 (*)    GFR calc Af Amer 34 (*)    All other components within normal limits  CBC - Abnormal; Notable for the following:    Hemoglobin 10.1 (*)    HCT 32.2 (*)    MCH 25.4 (*)    RDW 16.0 (*)    All other components within normal limits  HEPATIC FUNCTION PANEL - Abnormal; Notable for the  following:    Albumin 3.2 (*)    Indirect Bilirubin 0.2 (*)    All other components within normal limits  I-STAT TROPOININ, ED    EKG  EKG Interpretation  Date/Time:  Monday August 29 2016 10:58:04 EDT Ventricular Rate:  70 PR Interval:    QRS Duration: 103 QT Interval:  448 QTC Calculation: 484 R Axis:   -51 Text Interpretation:  Atrial fibrillation LAD, consider left anterior fascicular block Abnormal R-wave progression, late transition Abnormal T, consider ischemia, diffuse leads Confirmed by Keyshaun Exley  MD, Marianne Golightly [phone]) on 08/29/2016 12:16:24 PM       Radiology Dg Chest 2 View  Result Date: 08/29/2016 CLINICAL DATA:  Severe substernal chest pain today with shortness of breath and diaphoresis. EXAM: CHEST  2 VIEW COMPARISON:  08/09/2016 and 02/12/2015. FINDINGS: Stable cardiomegaly and aortic atherosclerosis post CABG. Left subclavian Port-A-Cath tip is unchanged in the mid SVC. There are right subclavian pacemaker leads in the right atrium and right ventricle. The lungs are clear. There is no pleural effusion or pneumothorax. No acute osseous findings are seen. There are mild degenerative changes throughout the spine associated with a scoliosis. IMPRESSION: Stable postoperative chest.  Mild cardiomegaly post CABG. Electronically Signed   By: Carey Bullocks M.D.   On: 08/29/2016 12:24    Procedures Procedures  DIAGNOSTIC STUDIES:  Oxygen Saturation is 97% on RA, normal by my interpretation.    COORDINATION OF CARE: 12:06 PM Discussed next steps with pt. Pt verbalized understanding and is agreeable with the plan.    Medications Ordered in ED Medications - No data to display   Initial Impression / Assessment and Plan / ED Course  I have reviewed the triage vital signs and the nursing notes.  Pertinent labs & imaging results that were available during my care of the patient were reviewed by me and considered in my medical decision making (see chart for details).  Clinical  Course    Patient with a history of coronary artery disease. Patient's EKG shows worsening inverted T waves inferiorly. Patient will be admitted for rule out then cardiology will consult Final Clinical Impressions(s) / ED Diagnoses   Final diagnoses:  None    New Prescriptions New Prescriptions   No medications on file     Bethann BerkshireJoseph Marlenne Ridge, MD 08/29/16 1537

## 2016-08-30 ENCOUNTER — Other Ambulatory Visit: Payer: Self-pay | Admitting: Adult Health

## 2016-08-30 DIAGNOSIS — I5032 Chronic diastolic (congestive) heart failure: Secondary | ICD-10-CM | POA: Diagnosis not present

## 2016-08-30 DIAGNOSIS — I209 Angina pectoris, unspecified: Secondary | ICD-10-CM | POA: Diagnosis not present

## 2016-08-30 DIAGNOSIS — R079 Chest pain, unspecified: Secondary | ICD-10-CM | POA: Diagnosis not present

## 2016-08-30 DIAGNOSIS — E785 Hyperlipidemia, unspecified: Secondary | ICD-10-CM

## 2016-08-30 DIAGNOSIS — I25708 Atherosclerosis of coronary artery bypass graft(s), unspecified, with other forms of angina pectoris: Secondary | ICD-10-CM | POA: Diagnosis not present

## 2016-08-30 DIAGNOSIS — I10 Essential (primary) hypertension: Secondary | ICD-10-CM | POA: Diagnosis not present

## 2016-08-30 DIAGNOSIS — I482 Chronic atrial fibrillation: Secondary | ICD-10-CM | POA: Diagnosis not present

## 2016-08-30 DIAGNOSIS — I509 Heart failure, unspecified: Secondary | ICD-10-CM | POA: Diagnosis not present

## 2016-08-30 DIAGNOSIS — R42 Dizziness and giddiness: Secondary | ICD-10-CM | POA: Diagnosis not present

## 2016-08-30 DIAGNOSIS — R9431 Abnormal electrocardiogram [ECG] [EKG]: Secondary | ICD-10-CM | POA: Diagnosis not present

## 2016-08-30 DIAGNOSIS — I2510 Atherosclerotic heart disease of native coronary artery without angina pectoris: Secondary | ICD-10-CM | POA: Diagnosis not present

## 2016-08-30 DIAGNOSIS — R0789 Other chest pain: Secondary | ICD-10-CM

## 2016-08-30 DIAGNOSIS — Z79899 Other long term (current) drug therapy: Secondary | ICD-10-CM | POA: Diagnosis not present

## 2016-08-30 DIAGNOSIS — Z8673 Personal history of transient ischemic attack (TIA), and cerebral infarction without residual deficits: Secondary | ICD-10-CM | POA: Diagnosis not present

## 2016-08-30 DIAGNOSIS — J45909 Unspecified asthma, uncomplicated: Secondary | ICD-10-CM | POA: Diagnosis not present

## 2016-08-30 DIAGNOSIS — J449 Chronic obstructive pulmonary disease, unspecified: Secondary | ICD-10-CM | POA: Diagnosis not present

## 2016-08-30 LAB — GLUCOSE, CAPILLARY
GLUCOSE-CAPILLARY: 106 mg/dL — AB (ref 65–99)
GLUCOSE-CAPILLARY: 125 mg/dL — AB (ref 65–99)

## 2016-08-30 LAB — TROPONIN I: Troponin I: <0.03 ng/mL (ref <0.03)

## 2016-08-30 MED ORDER — ISOSORBIDE MONONITRATE ER 30 MG PO TB24: 30.0000 mg | ORAL_TABLET | Freq: Every day | ORAL | 3 refills | Status: DC

## 2016-08-30 MED ORDER — ISOSORBIDE MONONITRATE ER 60 MG PO TB24
30.0000 mg | ORAL_TABLET | Freq: Every day | ORAL | Status: DC
  Administered 2016-08-30: 30 mg via ORAL
  Filled 2016-08-30: qty 1

## 2016-08-30 NOTE — Care Management Note (Signed)
Case Management Note  Patient Details  Name: [name] MRN: [id number] Date of Birth: 08-Oct-1941  Subjective/Objective:                  Pt admitted obs for r/o CP. She is from home, lives with her granddaughter and is ind with ADL's. She drives herself to appointments, has a PCP, she has insurance with drug coverage. She uses a cane with ambulation. She plans to return home with self care today.   Action/Plan: No CM needs anticipated.   Expected Discharge Date:    08/30/2016              Expected Discharge Plan:  Home/Self Care  In-House Referral:  NA  Discharge planning Services  CM Consult  Post Acute Care Choice:  NA Choice offered to:  NA  DME Arranged:    DME Agency:     HH Arranged:    HH Agency:     Status of Service:  Completed, signed off  If discussed at [employer] Length of Stay Meetings, dates discussed:    Additional Comments:  Malcolm MetroChildress, Skyler Dusing Demske, RN 08/30/2016, 2:07 PM

## 2016-08-30 NOTE — Progress Notes (Signed)
SUBJECTIVE: Had some mild chest pains last night. Troponins normal. Complains of left leg pain.   Review of Systems: As per "subjective", otherwise negative.  Allergies  Allergen Reactions  . Codeine     headache  . Other Nausea And Vomiting    Leafy raw vegetables and seeds  . Oxycodone Hcl      headache; also OxyContin  . Peanuts [Peanut Oil] Nausea And Vomiting  . Reglan [Metoclopramide]     CONFUSION    Current Facility-Administered Medications  Medication Dose Route Frequency Provider Last Rate Last Dose  . acetaminophen (TYLENOL) tablet 650 mg  650 mg Oral Q4H PRN Jonah Blue, MD      . apixaban Everlene Balls) tablet 5 mg  5 mg Oral BID Jonah Blue, MD   5 mg at 08/29/16 2218  . aspirin EC tablet 325 mg  325 mg Oral Daily Jonah Blue, MD      . atorvastatin (LIPITOR) tablet 40 mg  40 mg Oral QPM Jonah Blue, MD   40 mg at 08/29/16 2226  . benazepril (LOTENSIN) tablet 40 mg  40 mg Oral q morning - 10a Jonah Blue, MD      . Chlorhexidine Gluconate Cloth 2 % PADS 6 each  6 each Topical Q0600 Kari Baars, MD   6 each at 08/30/16 0615  . docusate sodium (COLACE) capsule 100 mg  100 mg Oral Daily Jonah Blue, MD      . ferrous sulfate tablet 325 mg  325 mg Oral Q breakfast Jonah Blue, MD      . gi cocktail (Maalox,Lidocaine,Donnatal)  30 mL Oral QID PRN Jonah Blue, MD      . insulin aspart (novoLOG) injection 0-15 Units  0-15 Units Subcutaneous TID WC Jonah Blue, MD      . isosorbide mononitrate (IMDUR) 24 hr tablet 30 mg  30 mg Oral Daily Laqueta Linden, MD      . ketorolac (ACULAR) 0.5 % ophthalmic solution 1 drop  1 drop Right Eye TID Jonah Blue, MD   1 drop at 08/29/16 2312  . levETIRAcetam (KEPPRA) tablet 500 mg  500 mg Oral BID Jonah Blue, MD   500 mg at 08/29/16 2218  . lubiprostone (AMITIZA) capsule 24 mcg  24 mcg Oral BID WC Jonah Blue, MD   24 mcg at 08/29/16 1848  . metoprolol tartrate (LOPRESSOR) tablet 25 mg  25 mg  Oral BID Jonah Blue, MD   25 mg at 08/29/16 2218  . morphine 2 MG/ML injection 2 mg  2 mg Intravenous Q2H PRN Jonah Blue, MD      . mupirocin ointment (BACTROBAN) 2 % 1 application  1 application Nasal BID Kari Baars, MD   1 application at 08/29/16 2316  . nitroGLYCERIN (NITROSTAT) SL tablet 0.4 mg  0.4 mg Sublingual Q5 min PRN Bethann Berkshire, MD   0.4 mg at 08/29/16 1421  . omega-3 acid ethyl esters (LOVAZA) capsule 2 g  2 g Oral BID Jonah Blue, MD   2 g at 08/29/16 2218  . ondansetron (ZOFRAN) injection 4 mg  4 mg Intravenous Q6H PRN Jonah Blue, MD      . pantoprazole (PROTONIX) EC tablet 40 mg  40 mg Oral Daily Jonah Blue, MD   40 mg at 08/29/16 2225  . pneumococcal 23 valent vaccine (PNU-IMMUNE) injection 0.5 mL  0.5 mL Intramuscular Tomorrow-1000 Kari Baars, MD      . potassium chloride (K-DUR,KLOR-CON) CR tablet 10 mEq  10 mEq  Oral Daily Jonah Blue, MD      . traZODone (DESYREL) tablet 50 mg  50 mg Oral QHS Jonah Blue, MD   50 mg at 08/29/16 2218    Past Medical History:  Diagnosis Date  . Anemia   . Anginal pain (HCC)    "all the time, but not heart related"  . Arthritis    "legs and back" (09/30/2013)  . Asthma    years ago  . 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 Blurred vision, bilateral    sometimes double vision  . CAD (coronary artery disease)    multivessel, CABG 6/10.  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  Myoview (11/20/11) showed no ischemia & EF 62%  . Chronic lower back pain   . COPD (chronic obstructive pulmonary disease) (HCC)    severe lung disease by PFTs 6/12  . DDD (degenerative disc disease)   . Deep vein thrombosis, upper right extremity (HCC)    post-PPM  . Dehiscence of closure of sternum or sternotomy 07/2009   sternal infection; required flap closure  . Diabetes mellitus, type II (HCC)   . Gastroesophageal reflux disease    not much  . H/O hiatal hernia   . History of kidney stones     . Hyperlipidemia   . Hypertension   . Migraines    "years ago" (09/30/2013)  . Obesity   . Orthopnea    sometimes  . Pacemaker   . PUD (peptic ulcer disease)   . Seizures (HCC)    "long time ago; don't remember what they were related to" (09/30/2013)none recent  . Sleep apnea    denies CPAP use on 09/30/2013  . Stroke [facility])    years ago, "residual on left back"  . Urge incontinence of urine     Past Surgical History:  Procedure Laterality Date  . ABDOMINAL HYSTERECTOMY     partial  . APPENDECTOMY    . BACK SURGERY     x3 total  . CESAREAN SECTION  ~1980  . CHOLECYSTECTOMY    . COLONOSCOPY  04/27/2005   hemorrhoids, diverticula  . COLONOSCOPY WITH ESOPHAGOGASTRODUODENOSCOPY (EGD) N/A 02/20/2013   RMR: pancolonic diverticulosis and internal hemorrhoids. EGD with small hiatal hernia, healed gastric ulcer  . CORONARY ARTERY BYPASS GRAFT  05/1999   - LIMA to LAD, SVG to OM, SVG to PDA  . ESOPHAGOGASTRODUODENOSCOPY     with [facility] dilation,  biopsy, and disruption Schatzki's ring  . ESOPHAGOGASTRODUODENOSCOPY  09/10/2012   RMR: Noncritical Schatzki's ring. Diffuse gastric erosions and an  area of partially healing ulceration-status post biopsy/ Small hiatal hernia. Bx reactive gastropathy.  . INSERT / REPLACE / REMOVE PACEMAKER  1993  . JOINT REPLACEMENT    . LUMBAR DISC SURGERY    . PACEMAKER PLACEMENT  1993   Status post DDD pacemaker implantation in 1993, with Medtronic Kappa generator change in 2003 for  second-degree AV block, most recent gen change by Fawn Kirk 04/14/11  . PORTACATH PLACEMENT Left 07/17/2013   Procedure: INSERTION PORT-A-CATH-left subclavian;  Surgeon: Fabio Bering, MD;  Location: [facility];  Service: General;  Laterality: Left;  . POSTERIOR LUMBAR FUSION    . RECONSTRUCTIVE REPAIR STERNAL     for infection s/p CABG 8/10  . REPLACEMENT TOTAL KNEE Bilateral   . THYROIDECTOMY    . TOTAL HIP ARTHROPLASTY Right   . TOTAL HIP ARTHROPLASTY Left 12/03/2013    Procedure: LEFT TOTAL HIP ARTHROPLASTY ANTERIOR APPROACH;  Surgeon: Shelda PalMatthew D Olin, MD;  Location: [facility];  Service: Orthopedics;  Laterality: Left;  . TUBAL LIGATION    . WRIST SURGERY Left    "tumor taken off" (09/30/2013)    Social History   Social History  . Marital status: Married    Spouse name: N/A  . Number of children: 5  . Years of education: N/A   Occupational History  .  Retired   Social History Main Topics  . Smoking status: Never Smoker  . Smokeless tobacco: Former NeurosurgeonUser    Types: Snuff     Comment: 09/30/2013 "stopped snuff a long time ago"  . Alcohol use No  . Drug use: No  . Sexual activity: No   Other Topics Concern  . Not on file   Social History Narrative   Lives at home with daughter, son-in-law   Caffeine use-  none     Vitals:   08/29/16 1730 08/29/16 1830 08/29/16 2031 08/30/16 0432  BP: (!) 147/104 (!) 146/77 (!) 188/89 (!) 142/73  Pulse: 61 67 75 65  Resp: 18 18 18 18   Temp:  97.7 F (36.5 C) 98.3 F (36.8 C) 98.4 F (36.9 C)  TempSrc:  Oral Oral Oral  SpO2: 96% 97% 97% 99%  Weight:  185 lb 13.6 oz (84.3 kg)    Height:  5\' 2"  (1.575 m)      PHYSICAL EXAM General: NAD Neck: No JVD, no thyromegaly or thyroid nodule.  Lungs: Clear to auscultation bilaterally with normal respiratory effort. CV: Nondisplaced PMI. Regular rate and irregular rhythm, normal S1/S2, no S3, no murmur.  No peripheral edema.  Abdomen: Soft, nontender, obese, no distention.  Skin: Intact without lesions or rashes.  Neurologic: Alert and oriented x 3.  Psych: Normal affect. Extremities: No clubbing or cyanosis.  HEENT: Normal.     ECG: Most recent ECG reviewed.      ASSESSMENT AND PLAN: . Chest pain in context of CAD/CABG: Troponins are normal. I will add long-acting nitrates. Does have diffuse TWI's. Would recommend outpatient stress testing (will arrange). Continue Lipitor and metoprolol.  2. Atrial fibrillation, chronic: Rate controlled on  metoprolol. Continue Eliquis for anticoagulation.  3. HTN: Monitor given addition of Imdur.  4. Pacemaker: Normal function in 05/2016. Follows with Dr. Ladona Ridgelaylor.  Dispo: Stable for discharge from CV standpoint. I will arrange for outpatient stress testing.   Prentice DockerSuresh Koneswaran, M.D., F.A.C.C.

## 2016-08-30 NOTE — Discharge Summary (Signed)
Physician Discharge Summary  Patient ID: [name] MRN: [id number] DOB/AGE: 77-Aug-1942 77 y.o. Primary Care Physician:HAWKINS,EDWARD L, MD Admit date: 08/29/2016 Discharge date: 08/30/2016    Discharge Diagnoses:  Principal Problem:   Chest pain Active Problems:   Chronic diastolic heart failure (HCC)   Diabetes mellitus, type II (HCC)   Hypertension   Arteriosclerotic cardiovascular disease (ASCVD)   Gastroesophageal reflux disease   Hyperlipidemia   COPD (chronic obstructive pulmonary disease) (HCC)   Chronic kidney disease (CKD), stage III (moderate)   Atrial fibrillation (HCC)     Medication List    TAKE these medications   acetaminophen 325 MG tablet Commonly known as:  TYLENOL Take 325-650 mg by mouth daily as needed for mild pain or moderate pain.   apixaban 5 MG Tabs tablet Commonly known as:  ELIQUIS Take 5 mg by mouth 2 (two) times daily.   atorvastatin 40 MG tablet Commonly known as:  LIPITOR Take 1 tablet (40 mg total) by mouth daily. What changed:  when to take this   benazepril 40 MG tablet Commonly known as:  LOTENSIN Take 40 mg by mouth every morning.   docusate sodium 100 MG capsule Commonly known as:  COLACE Take 100 mg by mouth daily.   ferrous sulfate 325 (65 FE) MG tablet Take 325 mg by mouth daily with breakfast.   isosorbide mononitrate 30 MG 24 hr tablet Commonly known as:  IMDUR Take 1 tablet (30 mg total) by mouth daily. Start taking on:  08/31/2016   ketorolac 0.5 % ophthalmic solution Commonly known as:  ACULAR Place 1 drop into the right eye 3 (three) times daily.   levETIRAcetam 500 MG tablet Commonly known as:  KEPPRA Take 500 mg by mouth 2 (two) times daily.   lubiprostone 24 MCG capsule Commonly known as:  AMITIZA Take 24 mcg by mouth 2 (two) times daily with a meal.   metFORMIN 500 MG tablet Commonly known as:  GLUCOPHAGE Take 500 mg by mouth 2 (two) times daily with a meal.   metoprolol tartrate 25 MG  tablet Commonly known as:  LOPRESSOR Take 25 mg by mouth 2 (two) times daily.   nitroGLYCERIN 0.4 MG SL tablet Commonly known as:  NITROSTAT Place 1 tablet (0.4 mg total) under the tongue every 5 (five) minutes as needed for chest pain.   omega-3 acid ethyl esters 1 g capsule Commonly known as:  LOVAZA Take 2 g by mouth 2 (two) times daily.   ondansetron 4 MG disintegrating tablet Commonly known as:  ZOFRAN ODT Take 1 tablet (4 mg total) by mouth every 8 (eight) hours as needed.   pantoprazole 40 MG tablet Commonly known as:  PROTONIX Take 1 tablet (40 mg total) by mouth daily. What changed:  when to take this  reasons to take this  additional instructions   potassium chloride 10 MEQ tablet Commonly known as:  K-DUR,KLOR-CON Take 10 mEq by mouth daily.   traZODone 50 MG tablet Commonly known as:  DESYREL Take 50 mg by mouth at bedtime.       Discharged Condition: improved   Consults: cardiology  Significant Diagnostic Studies: Dg Chest 2 View  Result Date: 08/29/2016 CLINICAL DATA:  Severe substernal chest pain today with shortness of breath and diaphoresis. EXAM: CHEST  2 VIEW COMPARISON:  08/09/2016 and 02/12/2015. FINDINGS: Stable cardiomegaly and aortic atherosclerosis post CABG. Left subclavian Port-A-Cath tip is unchanged in the mid SVC. There are right subclavian pacemaker leads in the right atrium and right  ventricle. The lungs are clear. There is no pleural effusion or pneumothorax. No acute osseous findings are seen. There are mild degenerative changes throughout the spine associated with a scoliosis. IMPRESSION: Stable postoperative chest.  Mild cardiomegaly post CABG. Electronically Signed   By: Carey Bullocks M.D.   On: 08/29/2016 12:24   Dg Chest 2 View  Result Date: 08/09/2016 CLINICAL DATA:  Allergic reaction to peanuts with abdominal pain and vomiting. EXAM: CHEST  2 VIEW COMPARISON:  02/12/2015 FINDINGS: Previous CABG. Dual lead pacemaker well  positioned. Power port from a left subclavian approach has its tip in the SVC. Chronic cardiomegaly. Chronic aortic atherosclerosis. The lungs are clear. The vascularity is normal. No effusions. No acute bone finding. IMPRESSION: No active cardiopulmonary disease. Previous CABG. Pacemaker. Cardiomegaly. Power port. Electronically Signed   By: Paulina Fusi M.D.   On: 08/09/2016 21:53    Lab Results: Basic Metabolic Panel:  Recent Labs  16/10/96 1229  NA 136  K 3.5  CL 108  CO2 22  GLUCOSE 107*  BUN 26*  CREATININE 1.61*  CALCIUM 9.6   Liver Function Tests:  Recent Labs  08/29/16 1229  AST 19  ALT 14  ALKPHOS 53  BILITOT 0.3  PROT 7.0  ALBUMIN 3.2*     CBC:  Recent Labs  08/29/16 1229  WBC 7.4  HGB 10.1*  HCT 32.2*  MCV 81.1  PLT 210    Recent Results (from the past 240 hour(s))  MRSA PCR Screening     Status: Abnormal   Collection Time: 08/29/16  6:05 PM  Result Value Ref Range Status   MRSA by PCR POSITIVE (A) NEGATIVE Final    Comment:        The GeneXpert MRSA Assay (FDA approved for NASAL specimens only), is one component of a comprehensive MRSA colonization surveillance program. It is not intended to diagnose MRSA infection nor to guide or monitor treatment for MRSA infections. RESULT CALLED TO, READ BACK BY AND VERIFIED WITH: HAMILTON,S ON 08/29/16 AT 2220 BY [facility] Course:  This is a 75 years old female with history of multiple medical illnesses came with complaint of chest pain. Patient admitted under telemetry. She had serial troponin which was negative. She was evaluated by cardiology and planned for stress test in out patient. Cardiology cleared for discharge  Discharge Exam: Blood pressure (!) 142/73, pulse 65, temperature 98.4 F (36.9 C), temperature source Oral, resp. rate 18, height 5\' 2"  (1.575 m), weight 84.3 kg (185 lb 13.6 oz), SpO2 99 %.   Disposition: home    Follow-up Information    [facility] Follow up on 09/06/2016.   Specialty:  Radiology Why:  Register at 10:15 for stress test. Do not eat or drink after midnight. Contact information: [address] [id number] mc [place] [address] [phone]       HAWKINS,EDWARD L, MD Follow up in 2 week(s).   Specialty:  Pulmonary Disease Contact information: [address] [place] [address] [phone]           Signed: Kamaryn Grimley   08/30/2016, 1:31 PM

## 2016-08-30 NOTE — Discharge Instructions (Signed)

## 2016-08-30 NOTE — Care Management CC44 (Signed)
Condition Code 44 Documentation Completed  Patient Details  Name: [name] MRN: [id number] Date of Birth: February 21, 1941   Condition Code 44 given:    Patient signature on Condition Code 44 notice:    Documentation of 2 MD's agreement:    Code 44 added to claim:       Malcolm Metrohildress, Janeshia Ciliberto Demske, RN 08/30/2016, 2:06 PM

## 2016-08-30 NOTE — Progress Notes (Signed)
Pt had cardiology consult and was thought to have stress test today. [facility] is following. MD stated pt will have stress test as outpatient. Verbal order to discontinue stress test order and give Heart Healthy/carb modified diet. RN will continue to order. Lesly Dukesachel J Everett, RN

## 2016-08-30 NOTE — Care Management Obs Status (Signed)
MEDICARE OBSERVATION STATUS NOTIFICATION   Patient Details  Name: [name] MRN: [id number] Date of Birth: 12/29/1940   Medicare Observation Status Notification Given:  Yes    Evangelia Whitaker Demske, RN 08/30/2016, 2:06 PM 

## 2016-08-30 NOTE — Progress Notes (Signed)
Subjective: Patient was admitted yesterday due to chest pain. Her troponin was negative but abnormal EKG. Patient was evaluated by cardiology and scheduled for further test.  Objective: Vital signs in last 24 hours: Temp:  [97.7 F (36.5 C)-98.6 F (37 C)] 98.4 F (36.9 C) (09/26 0432) Pulse Rate:  [61-86] 65 (09/26 0432) Resp:  [11-25] 18 (09/26 0432) BP: (113-188)/(71-104) 142/73 (09/26 0432) SpO2:  [94 %-99 %] 99 % (09/26 0432) Weight:  [84.3 kg (185 lb 13.6 oz)-108.9 kg (240 lb)] 84.3 kg (185 lb 13.6 oz) (09/25 1830) Weight change:  Last BM Date: 08/29/16  Intake/Output from previous day: 09/25 0701 - 09/26 0700 In: 500 [IV Piggyback:500] Out: 500 [Urine:500]  PHYSICAL EXAM General appearance: alert and no distress Resp: clear to auscultation bilaterally Cardio: irregularly irregular rhythm GI: soft, non-tender; bowel sounds normal; no masses,  no organomegaly Extremities: extremities normal, atraumatic, no cyanosis or edema  Lab Results:  Results for orders placed or performed during the hospital encounter of 08/29/16 (from the past 48 hour(s))  I-stat troponin, ED     Status: None   Collection Time: 08/29/16 12:01 PM  Result Value Ref Range   Troponin i, poc 0.01 0.00 - 0.08 ng/mL   Comment 3            Comment: Due to the release kinetics of cTnI, a negative result within the first hours of the onset of symptoms does not rule out myocardial infarction with certainty. If myocardial infarction is still suspected, repeat the test at appropriate intervals.   Basic metabolic panel     Status: Abnormal   Collection Time: 08/29/16 12:29 PM  Result Value Ref Range   Sodium 136 135 - 145 mmol/L   Potassium 3.5 3.5 - 5.1 mmol/L   Chloride 108 101 - 111 mmol/L   CO2 22 22 - 32 mmol/L   Glucose, Bld 107 (H) 65 - 99 mg/dL   BUN 26 (H) 6 - 20 mg/dL   Creatinine, Ser 1.61 (H) 0.44 - 1.00 mg/dL   Calcium 9.6 8.9 - 10.3 mg/dL   GFR calc non Af Amer 30 (L) >60 mL/min   GFR calc Af Amer 34 (L) >60 mL/min    Comment: (NOTE) The eGFR has been calculated using the CKD EPI equation. This calculation has not been validated in all clinical situations. eGFR's persistently <60 mL/min signify possible Chronic Kidney Disease.    Anion gap 6 5 - 15  CBC     Status: Abnormal   Collection Time: 08/29/16 12:29 PM  Result Value Ref Range   WBC 7.4 4.0 - 10.5 K/uL   RBC 3.97 3.87 - 5.11 MIL/uL   Hemoglobin 10.1 (L) 12.0 - 15.0 g/dL   HCT 32.2 (L) 36.0 - 46.0 %   MCV 81.1 78.0 - 100.0 fL   MCH 25.4 (L) 26.0 - 34.0 pg   MCHC 31.4 30.0 - 36.0 g/dL   RDW 16.0 (H) 11.5 - 15.5 %   Platelets 210 150 - 400 K/uL  Hepatic function panel     Status: Abnormal   Collection Time: 08/29/16 12:29 PM  Result Value Ref Range   Total Protein 7.0 6.5 - 8.1 g/dL   Albumin 3.2 (L) 3.5 - 5.0 g/dL   AST 19 15 - 41 U/L   ALT 14 14 - 54 U/L   Alkaline Phosphatase 53 38 - 126 U/L   Total Bilirubin 0.3 0.3 - 1.2 mg/dL   Bilirubin, Direct 0.1 0.1 - 0.5 mg/dL  Indirect Bilirubin 0.2 (L) 0.3 - 0.9 mg/dL  Troponin I-serum (0, 3, 6 hours)     Status: None   Collection Time: 08/29/16  3:00 PM  Result Value Ref Range   Troponin I <0.03 <0.03 ng/mL  Lipid panel     Status: None   Collection Time: 08/29/16  3:00 PM  Result Value Ref Range   Cholesterol 114 0 - 200 mg/dL   Triglycerides 50 <150 mg/dL   HDL 47 >40 mg/dL   Total CHOL/HDL Ratio 2.4 RATIO   VLDL 10 0 - 40 mg/dL   LDL Cholesterol 57 0 - 99 mg/dL    Comment:        Total Cholesterol/HDL:CHD Risk Coronary Heart Disease Risk Table                     Men   Women  1/2 Average Risk   3.4   3.3  Average Risk       5.0   4.4  2 X Average Risk   9.6   7.1  3 X Average Risk  23.4   11.0        Use the calculated Patient Ratio above and the CHD Risk Table to determine the patient's CHD Risk.        ATP III CLASSIFICATION (LDL):  <100     mg/dL   Optimal  100-129  mg/dL   Near or Above                    Optimal  130-159   mg/dL   Borderline  160-189  mg/dL   High  >190     mg/dL   Very High   TSH     Status: None   Collection Time: 08/29/16  3:00 PM  Result Value Ref Range   TSH 0.623 0.350 - 4.500 uIU/mL  MRSA PCR Screening     Status: Abnormal   Collection Time: 08/29/16  6:05 PM  Result Value Ref Range   MRSA by PCR POSITIVE (A) NEGATIVE    Comment:        The GeneXpert MRSA Assay (FDA approved for NASAL specimens only), is one component of a comprehensive MRSA colonization surveillance program. It is not intended to diagnose MRSA infection nor to guide or monitor treatment for MRSA infections. RESULT CALLED TO, READ BACK BY AND VERIFIED WITH: HAMILTON,S ON 08/29/16 AT 2220 BY LOY,C   Glucose, capillary     Status: Abnormal   Collection Time: 08/29/16  9:18 PM  Result Value Ref Range   Glucose-Capillary 114 (H) 65 - 99 mg/dL   Comment 1 Notify RN    Comment 2 Document in Chart   Troponin I-serum (0, 3, 6 hours)     Status: None   Collection Time: 08/30/16 12:16 AM  Result Value Ref Range   Troponin I <0.03 <0.03 ng/mL  Troponin I-serum (0, 3, 6 hours)     Status: None   Collection Time: 08/30/16  6:18 AM  Result Value Ref Range   Troponin I <0.03 <0.03 ng/mL  Glucose, capillary     Status: Abnormal   Collection Time: 08/30/16  7:24 AM  Result Value Ref Range   Glucose-Capillary 106 (H) 65 - 99 mg/dL    ABGS No results for input(s): PHART, PO2ART, TCO2, HCO3 in the last 72 hours.  Invalid input(s): PCO2 CULTURES Recent Results (from the past 240 hour(s))  MRSA PCR Screening     Status:  Abnormal   Collection Time: 08/29/16  6:05 PM  Result Value Ref Range Status   MRSA by PCR POSITIVE (A) NEGATIVE Final    Comment:        The GeneXpert MRSA Assay (FDA approved for NASAL specimens only), is one component of a comprehensive MRSA colonization surveillance program. It is not intended to diagnose MRSA infection nor to guide or monitor treatment for MRSA infections. RESULT  CALLED TO, READ BACK BY AND VERIFIED WITH: HAMILTON,S ON 08/29/16 AT 2220 BY LOY,C    Studies/Results: Dg Chest 2 View  Result Date: 08/29/2016 CLINICAL DATA:  Severe substernal chest pain today with shortness of breath and diaphoresis. EXAM: CHEST  2 VIEW COMPARISON:  08/09/2016 and 02/12/2015. FINDINGS: Stable cardiomegaly and aortic atherosclerosis post CABG. Left subclavian Port-A-Cath tip is unchanged in the mid SVC. There are right subclavian pacemaker leads in the right atrium and right ventricle. The lungs are clear. There is no pleural effusion or pneumothorax. No acute osseous findings are seen. There are mild degenerative changes throughout the spine associated with a scoliosis. IMPRESSION: Stable postoperative chest.  Mild cardiomegaly post CABG. Electronically Signed   By: Richardean Sale M.D.   On: 08/29/2016 12:24    Medications: I have reviewed the patient's current medications.  Assesment:  Principal Problem:   Chest pain Active Problems:   Chronic diastolic heart failure (HCC)   Diabetes mellitus, type II (HCC)   Hypertension   Arteriosclerotic cardiovascular disease (ASCVD)   Gastroesophageal reflux disease   Hyperlipidemia   COPD (chronic obstructive pulmonary disease) (HCC)   Chronic kidney disease (CKD), stage III (moderate)   Atrial fibrillation ([place])    Plan:  Medications reviewed cardiology consult  Appreciated As per cardiology plan Continue telemetry     LOS: 0 days   Jayde Mcallister 08/30/2016, 8:04 AM

## 2016-08-30 NOTE — Care Management Obs Status (Deleted)
MEDICARE OBSERVATION STATUS NOTIFICATION   Patient Details  Name: [name] MRN: [id number] Date of Birth: January 25, 1941   Medicare Observation Status Notification Given:  Yes    Malcolm MetroChildress, Takoda Janowiak Demske, RN 08/30/2016, 2:06 PM

## 2016-08-31 ENCOUNTER — Other Ambulatory Visit: Payer: Self-pay | Admitting: *Deleted

## 2016-08-31 ENCOUNTER — Encounter: Payer: Self-pay | Admitting: *Deleted

## 2016-08-31 LAB — HEMOGLOBIN A1C
HEMOGLOBIN A1C: 6.5 % — AB (ref 4.8–5.6)
MEAN PLASMA GLUCOSE: 140 mg/dL

## 2016-08-31 NOTE — Patient Outreach (Signed)
[facility] Care Management  08/31/2016  [name] January 06, 1941 [id number]  [name] is a 75 y.o. female with medical history significant of coronary artery disease with coronary artery bypass grafting in 2010, hypertension, hyperlipidemia, diabetes type II, permanent pacemaker, afib on Eliquis who presented to the ED on 08/29/16 with complaint of chest pain after performing her usual household duties. She was evaluated and discharged to home yesterday afternoon.   I spoke with [name] by phone this afternoon and reviewed her hospitalization and post discharge instructions, including medications (Patient was recently discharged from hospital and all medications have been reviewed). [facility] and we arranged for a home visit next week on Thursday.   Plan: I will see [name] at home for transition of care visit.    [name] MHA,BSN,RN,CCM [facility]  [phone]

## 2016-09-01 ENCOUNTER — Ambulatory Visit: Payer: Self-pay | Admitting: *Deleted

## 2016-09-01 ENCOUNTER — Encounter: Payer: Self-pay | Admitting: *Deleted

## 2016-09-02 ENCOUNTER — Encounter (HOSPITAL_COMMUNITY): Payer: Commercial Managed Care - HMO

## 2016-09-05 ENCOUNTER — Other Ambulatory Visit: Payer: Self-pay | Admitting: *Deleted

## 2016-09-05 DIAGNOSIS — R079 Chest pain, unspecified: Secondary | ICD-10-CM

## 2016-09-05 NOTE — Progress Notes (Signed)
Order placed

## 2016-09-06 ENCOUNTER — Encounter (HOSPITAL_COMMUNITY): Payer: Commercial Managed Care - HMO

## 2016-09-06 ENCOUNTER — Inpatient Hospital Stay (HOSPITAL_COMMUNITY): Admit: 2016-09-06 | Payer: Commercial Managed Care - HMO

## 2016-09-08 ENCOUNTER — Other Ambulatory Visit: Payer: Self-pay | Admitting: *Deleted

## 2016-09-08 ENCOUNTER — Encounter: Payer: Self-pay | Admitting: *Deleted

## 2016-09-12 NOTE — Patient Outreach (Signed)
[facility]) Care Management   09/08/2016  Genise NASIYAH LAVERDIERE 1941-03-29 [id number]  Johna P Burtch is an 75 y.o. female with with medical history significant of coronary artery disease with coronary artery bypass grafting in 2010, hypertension, hyperlipidemia, diabetes type II, permanent pacemaker, afib on Eliquis who presented to the ED on 08/29/16 with complaint of chest pain after performing her usual household duties. She was evaluated and discharged to home. We spoke on 08/31/16 and she agreed to [facility] Care Management services. I am visiting Mrs. Dalziel in her home today.   Subjective: "I'm doing okay all except I keep falling and I don't know why."  Objective: SpO2 94%    Review of Systems  Constitutional:       Reports intermittent weakness in right leg  HENT: Negative.   Eyes: Negative for blurred vision, double vision and photophobia.  Respiratory: Negative.   Cardiovascular: Positive for chest pain. Negative for palpitations and leg swelling.       No chest pain since hospitalization but recently admitted for chest pain  Genitourinary: Negative.   Musculoskeletal: Positive for falls.       Reports daily falls x last 3 weeks  Skin: Negative.   Neurological: Positive for dizziness, focal weakness, loss of consciousness and weakness.       Patient reports that she's "not sure" if she gets dizzy or passes out before falls; states "I don't think so but sometimes I'm just out and down on the floor before I know what happened."  Reports that left leg "just goes out on me"  Psychiatric/Behavioral: The patient is nervous/anxious.        Reports anxiousness related to grand-daughters impending second heart surgery coming up this month    Physical Exam  Constitutional: She is oriented to person, place, and time. Vital signs are normal. She appears well-developed and well-nourished. She is active.  Non-toxic appearance. She does not have a sickly appearance. She does not  appear ill. No distress.  Cardiovascular: Normal rate.  An irregular rhythm present. Exam reveals no gallop and no friction rub.   No murmur heard. Respiratory: Effort normal and breath sounds normal. No respiratory distress. She has no wheezes. She has no rhonchi. She has no rales.  GI: Soft. Bowel sounds are normal. There is no tenderness.  Neurological: She is alert and oriented to person, place, and time.  Skin: Skin is warm and dry.  Psychiatric: She has a normal mood and affect. Her speech is normal and behavior is normal. Judgment and thought content normal. Cognition and memory are normal.    Encounter Medications:   Outpatient Encounter Prescriptions as of 09/08/2016  Medication Sig  . acetaminophen (TYLENOL) 325 MG tablet Take 325-650 mg by mouth daily as needed for mild pain or moderate pain.   Marland Kitchen apixaban (ELIQUIS) 5 MG TABS tablet Take 5 mg by mouth 2 (two) times daily.   Marland Kitchen atorvastatin (LIPITOR) 40 MG tablet Take 1 tablet (40 mg total) by mouth daily. (Patient taking differently: Take 40 mg by mouth every evening. )  . benazepril (LOTENSIN) 40 MG tablet Take 40 mg by mouth every morning.  . docusate sodium (COLACE) 100 MG capsule Take 100 mg by mouth daily.  . ferrous sulfate 325 (65 FE) MG tablet Take 325 mg by mouth daily with breakfast.  . isosorbide mononitrate (IMDUR) 30 MG 24 hr tablet Take 1 tablet (30 mg total) by mouth daily.  Marland Kitchen ketorolac (ACULAR) 0.5 % ophthalmic solution Place  1 drop into the right eye 3 (three) times daily.  Marland Kitchen levETIRAcetam (KEPPRA) 500 MG tablet Take 500 mg by mouth 2 (two) times daily.  Marland Kitchen lubiprostone (AMITIZA) 24 MCG capsule Take 24 mcg by mouth 2 (two) times daily with a meal.  . metFORMIN (GLUCOPHAGE) 500 MG tablet Take 500 mg by mouth 2 (two) times daily with a meal.   . metoprolol tartrate (LOPRESSOR) 25 MG tablet Take 25 mg by mouth 2 (two) times daily.   . nitroGLYCERIN (NITROSTAT) 0.4 MG SL tablet Place 1 tablet (0.4 mg total) under the  tongue every 5 (five) minutes as needed for chest pain.  Marland Kitchen omega-3 acid ethyl esters (LOVAZA) 1 G capsule Take 2 g by mouth 2 (two) times daily.   . ondansetron (ZOFRAN ODT) 4 MG disintegrating tablet Take 1 tablet (4 mg total) by mouth every 8 (eight) hours as needed.  . pantoprazole (PROTONIX) 40 MG tablet Take 1 tablet (40 mg total) by mouth daily. (Patient taking differently: Take 40 mg by mouth daily as needed. Acid reflux)  . potassium chloride (K-DUR,KLOR-CON) 10 MEQ tablet Take 10 mEq by mouth daily.  . traZODone (DESYREL) 50 MG tablet Take 50 mg by mouth at bedtime.   Functional Status:   In your present state of health, do you have any difficulty performing the following activities: 09/08/2016 08/29/2016  Hearing? - N  Vision? - N  Difficulty concentrating or making decisions? - N  Walking or climbing stairs? - Y  Dressing or bathing? - N  Doing errands, shopping? - Y  Preparing Food and eating ? N -  Using the Toilet? N -  In the past six months, have you accidently leaked urine? N -  Do you have problems with loss of bowel control? N -  Managing your Medications? N -  Managing your Finances? N -  Housekeeping or managing your Housekeeping? N -  Some recent data might be hidden    Fall/Depression Screening:    PHQ 2/9 Scores 09/08/2016 08/18/2016  PHQ - 2 Score 0 0    Assessment:  75 year old female patient living in [place] with her daughter, son, and teenaged grand-daughter. Mrs. Madole unfortunately had to place her husband in skilled care last year after his health continued to deteriorate. Mrs. Krajewski has been doing fairly well until recently when she was admitted to the hospital after an episode of chest pain. She was found to have diffuse TWave inversions but her workup otherwise was negative.   I had a long visit with Mrs. Welliver today. She relates to me that she is having daily falls, sometimes more than once daily. She is not orthostatic. I asked what typically  happens preceding a fall and she said that "most of the time, my leg just goes out from under me and next thing you know I'm on the ground". She said she wasn't sure when I asked if she ever passed out or felt like she was going to pass out. She stated "I'm not sure if I do or not".   Based on her fall assessment, she is very high risk for fall. She is unable to stand unassisted. She cannot stand and maintain balance with her feet together for even one second. She is unable to walk and keep her balance without the use of a walker or balancing herself with furniture.   Mrs. Mittag and I discussed fall risk reduction strategies at length, including:   Rising slowly  Moving  forward slowly and intentionally  Keeping her head up and eyes forward when walking  Asking for helping when moving outside the home (several steps to the front lawn or car)  Mrs. Lindner agreed to a physical therapy consult and home care services if Dr. Juanetta GoslingHawkins agrees. I reached out to his office today to make this recommendation.    Plan:   I will follow up with Mrs. Humann by phone next week to follow up on her general condition, any further falls, and institution of home physical therapy.   I will plan to see Mrs. Wadleigh at home in a few weeks in follow up.    Marja Kayslisa Jantz Main MHA,BSN,RN,CCM [facility] Care Management  [phone]

## 2016-09-13 ENCOUNTER — Other Ambulatory Visit: Payer: Self-pay | Admitting: *Deleted

## 2016-09-13 ENCOUNTER — Encounter: Payer: Self-pay | Admitting: *Deleted

## 2016-09-13 NOTE — Patient Outreach (Signed)
[facility] Care Management  09/13/2016  [name] [birth year] [id number]   [name] is an 75 y.o. female with with medical history significant of coronary artery disease with coronary artery bypass grafting in 2010, hypertension, hyperlipidemia, diabetes type II, permanent pacemaker, afib on Eliquis who presented to the ED on 08/29/16 with complaint of chest pain after performing her usual household duties. She was evaluated and discharged to home. I isited [name] at home last week and am calling to follow up on her progress today.   Assessment:  75 year old female patient living in [place] with her daughter, son, and teenaged grand-daughter. [name] unfortunately had to place her husband in skilled care last year after his health continued to deteriorate. [name] has been doing fairly well until recently when she was admitted to the hospital after an episode of chest pain. She was found to have diffuse TWave inversions but her workup otherwise was negative.   [name] has been having daily falls, sometimes more than once daily. Her fall risk assessment was "high". Last week we discussed fall risk reduction strategies as outlined below and I recommended HHPT evaluation for assistance with strengthening, conditioning, balance, and gait.   Fall Risk Reduction Strategies:   Rising slowly  Moving forward slowly and intentionally  Keeping her head up and eyes forward when walking  Asking for helping when moving outside the home (several steps to the front lawn or car)  When I spoke with [name] today, she told me she had not heard from a home care agency about initiation of HHPT services. [facility] who will follow up and re-send orders.    Plan:   I will reach out to [name] again on Friday to follow up on HHPT referral receipt.      [name] MHA,BSN,RN,CCM [facility] Care Management  [phone]  [phone]

## 2016-09-16 ENCOUNTER — Encounter: Payer: Self-pay | Admitting: *Deleted

## 2016-09-16 ENCOUNTER — Other Ambulatory Visit: Payer: Self-pay | Admitting: *Deleted

## 2016-09-16 NOTE — Patient Outreach (Signed)
[facility]) Care Management  09/16/2016  Kimberleigh P Kyne 06-Jun-1941 [id number]  [name] has been having daily falls, sometimes more than once daily. Her fall risk assessment was "high". We have iscussed fall risk reduction strategies as outlined below and I recommended HHPT evaluation for assistance with strengthening, conditioning, balance, and gait.   Fall Risk Reduction Strategies:   Rising slowly  Moving forward slowly and intentionally  Keeping her head up and eyes forward when walking  Asking for helping when moving outside the home (several steps to the front lawn or car.   When I spoke with Mrs. Eveleth earlier in the week, she told me she had not heard from a home care agency about initiation of HHPT services. [facility] office who followed up and re-sent orders. I called [facility] today and they reported not having received orders but were able to fulfill the request if a new order can be sent. They are familiar with Mrs. Peachey and have her in the system. I sent another message to [name] requesting assistance.   Plan:   I will reach out to Mrs. Ek again next week to follow up on HHPT referral receipt.      Marja Kayslisa Rolland Steinert MHA,BSN,RN,CCM [facility] Care Management  [phone]

## 2016-09-20 ENCOUNTER — Other Ambulatory Visit: Payer: Self-pay | Admitting: *Deleted

## 2016-09-20 ENCOUNTER — Encounter: Payer: Self-pay | Admitting: *Deleted

## 2016-09-20 NOTE — Patient Outreach (Signed)
[facility]) Care Management  09/20/2016  [name] Jan 06, 1941 [id number]  Outreach to [name] today to follow up on falls and Home Health PT referral.   When I spoke with [name] earlier in the week, she told me she had not heard from a home care agency about initiation of HHPT services. I [facility] Dr. Juan[facility]' office who followed up and re-sent orders. I called [facility] today and they reported not having received orders but were able to fulfill the request if a new order can be sent. They are familiar with [name] and have her in the system. I sent another message to [name] requesting assistance.   [name] reports again today that she has not heard from a home care provider regarding initiation of HHPT services. I reached out to [name], RN @ Dr. Juanetta GoslingHawkins office who says she will follow with both home care agencies to whom she made a request for home care services and will follow up with me as soon as the issue is resolved.   Plan:   I will reach out to [name] again over the next few days to follow up on HHPT progress.   [name] MHA,BSN,RN,CCM [facility] Care [facility]  [phone]

## 2016-09-21 DIAGNOSIS — I130 Hypertensive heart and chronic kidney disease with heart failure and stage 1 through stage 4 chronic kidney disease, or unspecified chronic kidney disease: Secondary | ICD-10-CM | POA: Diagnosis not present

## 2016-09-21 DIAGNOSIS — M159 Polyosteoarthritis, unspecified: Secondary | ICD-10-CM | POA: Diagnosis not present

## 2016-09-21 DIAGNOSIS — G894 Chronic pain syndrome: Secondary | ICD-10-CM | POA: Diagnosis not present

## 2016-09-21 DIAGNOSIS — I509 Heart failure, unspecified: Secondary | ICD-10-CM | POA: Diagnosis not present

## 2016-09-21 DIAGNOSIS — M48061 Spinal stenosis, lumbar region without neurogenic claudication: Secondary | ICD-10-CM | POA: Diagnosis not present

## 2016-09-22 DIAGNOSIS — I25119 Atherosclerotic heart disease of native coronary artery with unspecified angina pectoris: Secondary | ICD-10-CM | POA: Diagnosis not present

## 2016-09-22 DIAGNOSIS — I110 Hypertensive heart disease with heart failure: Secondary | ICD-10-CM | POA: Diagnosis not present

## 2016-09-22 DIAGNOSIS — E1121 Type 2 diabetes mellitus with diabetic nephropathy: Secondary | ICD-10-CM | POA: Diagnosis not present

## 2016-09-22 DIAGNOSIS — R410 Disorientation, unspecified: Secondary | ICD-10-CM | POA: Diagnosis not present

## 2016-09-22 DIAGNOSIS — N183 Chronic kidney disease, stage 3 (moderate): Secondary | ICD-10-CM | POA: Diagnosis not present

## 2016-09-22 DIAGNOSIS — R110 Nausea: Secondary | ICD-10-CM | POA: Diagnosis not present

## 2016-09-22 DIAGNOSIS — R630 Anorexia: Secondary | ICD-10-CM | POA: Diagnosis not present

## 2016-09-22 DIAGNOSIS — E785 Hyperlipidemia, unspecified: Secondary | ICD-10-CM | POA: Diagnosis not present

## 2016-09-22 DIAGNOSIS — I509 Heart failure, unspecified: Secondary | ICD-10-CM | POA: Diagnosis not present

## 2016-09-22 DIAGNOSIS — I129 Hypertensive chronic kidney disease with stage 1 through stage 4 chronic kidney disease, or unspecified chronic kidney disease: Secondary | ICD-10-CM | POA: Diagnosis not present

## 2016-09-23 DIAGNOSIS — I509 Heart failure, unspecified: Secondary | ICD-10-CM | POA: Diagnosis not present

## 2016-09-23 DIAGNOSIS — G894 Chronic pain syndrome: Secondary | ICD-10-CM | POA: Diagnosis not present

## 2016-09-23 DIAGNOSIS — M48061 Spinal stenosis, lumbar region without neurogenic claudication: Secondary | ICD-10-CM | POA: Diagnosis not present

## 2016-09-23 DIAGNOSIS — I130 Hypertensive heart and chronic kidney disease with heart failure and stage 1 through stage 4 chronic kidney disease, or unspecified chronic kidney disease: Secondary | ICD-10-CM | POA: Diagnosis not present

## 2016-09-23 DIAGNOSIS — M159 Polyosteoarthritis, unspecified: Secondary | ICD-10-CM | POA: Diagnosis not present

## 2016-09-24 ENCOUNTER — Encounter (HOSPITAL_COMMUNITY): Payer: Self-pay | Admitting: Emergency Medicine

## 2016-09-24 ENCOUNTER — Inpatient Hospital Stay (HOSPITAL_COMMUNITY)
Admission: EM | Admit: 2016-09-24 | Discharge: 2016-09-28 | DRG: 683 | Disposition: A | Discharge status: Home Health | Payer: Commercial Managed Care - HMO | Attending: Pulmonary Disease | Admitting: Pulmonary Disease

## 2016-09-24 ENCOUNTER — Emergency Department (HOSPITAL_COMMUNITY): Payer: Commercial Managed Care - HMO

## 2016-09-24 DIAGNOSIS — S060X0A Concussion without loss of consciousness, initial encounter: Secondary | ICD-10-CM | POA: Diagnosis present

## 2016-09-24 DIAGNOSIS — M48062 Spinal stenosis, lumbar region with neurogenic claudication: Secondary | ICD-10-CM | POA: Diagnosis present

## 2016-09-24 DIAGNOSIS — E861 Hypovolemia: Secondary | ICD-10-CM | POA: Diagnosis not present

## 2016-09-24 DIAGNOSIS — Z7901 Long term (current) use of anticoagulants: Secondary | ICD-10-CM

## 2016-09-24 DIAGNOSIS — Z23 Encounter for immunization: Secondary | ICD-10-CM

## 2016-09-24 DIAGNOSIS — G40909 Epilepsy, unspecified, not intractable, without status epilepticus: Secondary | ICD-10-CM | POA: Diagnosis present

## 2016-09-24 DIAGNOSIS — Z8673 Personal history of transient ischemic attack (TIA), and cerebral infarction without residual deficits: Secondary | ICD-10-CM

## 2016-09-24 DIAGNOSIS — K5730 Diverticulosis of large intestine without perforation or abscess without bleeding: Secondary | ICD-10-CM | POA: Diagnosis present

## 2016-09-24 DIAGNOSIS — E872 Acidosis: Secondary | ICD-10-CM | POA: Diagnosis present

## 2016-09-24 DIAGNOSIS — I130 Hypertensive heart and chronic kidney disease with heart failure and stage 1 through stage 4 chronic kidney disease, or unspecified chronic kidney disease: Secondary | ICD-10-CM | POA: Diagnosis not present

## 2016-09-24 DIAGNOSIS — R2689 Other abnormalities of gait and mobility: Secondary | ICD-10-CM

## 2016-09-24 DIAGNOSIS — Z951 Presence of aortocoronary bypass graft: Secondary | ICD-10-CM

## 2016-09-24 DIAGNOSIS — Z885 Allergy status to narcotic agent status: Secondary | ICD-10-CM

## 2016-09-24 DIAGNOSIS — Z8249 Family history of ischemic heart disease and other diseases of the circulatory system: Secondary | ICD-10-CM

## 2016-09-24 DIAGNOSIS — K449 Diaphragmatic hernia without obstruction or gangrene: Secondary | ICD-10-CM | POA: Diagnosis present

## 2016-09-24 DIAGNOSIS — Z800 Family history of malignant neoplasm of digestive organs: Secondary | ICD-10-CM

## 2016-09-24 DIAGNOSIS — E669 Obesity, unspecified: Secondary | ICD-10-CM | POA: Diagnosis present

## 2016-09-24 DIAGNOSIS — K3184 Gastroparesis: Secondary | ICD-10-CM | POA: Diagnosis present

## 2016-09-24 DIAGNOSIS — E785 Hyperlipidemia, unspecified: Secondary | ICD-10-CM | POA: Diagnosis present

## 2016-09-24 DIAGNOSIS — K219 Gastro-esophageal reflux disease without esophagitis: Secondary | ICD-10-CM | POA: Diagnosis present

## 2016-09-24 DIAGNOSIS — W0110XA Fall on same level from slipping, tripping and stumbling with subsequent striking against unspecified object, initial encounter: Secondary | ICD-10-CM | POA: Diagnosis present

## 2016-09-24 DIAGNOSIS — Z91010 Allergy to peanuts: Secondary | ICD-10-CM

## 2016-09-24 DIAGNOSIS — R410 Disorientation, unspecified: Secondary | ICD-10-CM | POA: Diagnosis not present

## 2016-09-24 DIAGNOSIS — E1122 Type 2 diabetes mellitus with diabetic chronic kidney disease: Secondary | ICD-10-CM | POA: Diagnosis present

## 2016-09-24 DIAGNOSIS — N179 Acute kidney failure, unspecified: Secondary | ICD-10-CM | POA: Diagnosis not present

## 2016-09-24 DIAGNOSIS — E860 Dehydration: Secondary | ICD-10-CM | POA: Diagnosis present

## 2016-09-24 DIAGNOSIS — G4730 Sleep apnea, unspecified: Secondary | ICD-10-CM | POA: Diagnosis present

## 2016-09-24 DIAGNOSIS — I5032 Chronic diastolic (congestive) heart failure: Secondary | ICD-10-CM | POA: Diagnosis present

## 2016-09-24 DIAGNOSIS — I252 Old myocardial infarction: Secondary | ICD-10-CM

## 2016-09-24 DIAGNOSIS — N183 Chronic kidney disease, stage 3 unspecified: Secondary | ICD-10-CM | POA: Diagnosis present

## 2016-09-24 DIAGNOSIS — R300 Dysuria: Secondary | ICD-10-CM | POA: Diagnosis present

## 2016-09-24 DIAGNOSIS — E119 Type 2 diabetes mellitus without complications: Secondary | ICD-10-CM

## 2016-09-24 DIAGNOSIS — I2510 Atherosclerotic heart disease of native coronary artery without angina pectoris: Secondary | ICD-10-CM | POA: Diagnosis present

## 2016-09-24 DIAGNOSIS — Z8711 Personal history of peptic ulcer disease: Secondary | ICD-10-CM

## 2016-09-24 DIAGNOSIS — J449 Chronic obstructive pulmonary disease, unspecified: Secondary | ICD-10-CM | POA: Diagnosis present

## 2016-09-24 DIAGNOSIS — Z888 Allergy status to other drugs, medicaments and biological substances status: Secondary | ICD-10-CM

## 2016-09-24 DIAGNOSIS — Z96653 Presence of artificial knee joint, bilateral: Secondary | ICD-10-CM | POA: Diagnosis present

## 2016-09-24 DIAGNOSIS — R296 Repeated falls: Secondary | ICD-10-CM | POA: Diagnosis present

## 2016-09-24 DIAGNOSIS — E871 Hypo-osmolality and hyponatremia: Secondary | ICD-10-CM | POA: Diagnosis present

## 2016-09-24 DIAGNOSIS — M1990 Unspecified osteoarthritis, unspecified site: Secondary | ICD-10-CM | POA: Diagnosis present

## 2016-09-24 DIAGNOSIS — Z9181 History of falling: Secondary | ICD-10-CM | POA: Diagnosis not present

## 2016-09-24 DIAGNOSIS — I4891 Unspecified atrial fibrillation: Secondary | ICD-10-CM | POA: Diagnosis not present

## 2016-09-24 DIAGNOSIS — M6281 Muscle weakness (generalized): Secondary | ICD-10-CM

## 2016-09-24 DIAGNOSIS — W19XXXA Unspecified fall, initial encounter: Secondary | ICD-10-CM

## 2016-09-24 DIAGNOSIS — Z950 Presence of cardiac pacemaker: Secondary | ICD-10-CM | POA: Diagnosis present

## 2016-09-24 DIAGNOSIS — Z833 Family history of diabetes mellitus: Secondary | ICD-10-CM

## 2016-09-24 DIAGNOSIS — I10 Essential (primary) hypertension: Secondary | ICD-10-CM | POA: Diagnosis present

## 2016-09-24 DIAGNOSIS — Z87891 Personal history of nicotine dependence: Secondary | ICD-10-CM

## 2016-09-24 DIAGNOSIS — Z87442 Personal history of urinary calculi: Secondary | ICD-10-CM

## 2016-09-24 DIAGNOSIS — Z96643 Presence of artificial hip joint, bilateral: Secondary | ICD-10-CM | POA: Diagnosis present

## 2016-09-24 DIAGNOSIS — D649 Anemia, unspecified: Secondary | ICD-10-CM | POA: Diagnosis present

## 2016-09-24 DIAGNOSIS — Z7984 Long term (current) use of oral hypoglycemic drugs: Secondary | ICD-10-CM

## 2016-09-24 DIAGNOSIS — Z6835 Body mass index (BMI) 35.0-35.9, adult: Secondary | ICD-10-CM

## 2016-09-24 DIAGNOSIS — E1143 Type 2 diabetes mellitus with diabetic autonomic (poly)neuropathy: Secondary | ICD-10-CM | POA: Diagnosis present

## 2016-09-24 LAB — CBC WITH DIFFERENTIAL/PLATELET
Basophils Absolute: 0 10*3/uL (ref 0.0–0.1)
Basophils Relative: 0 %
EOS ABS: 0 10*3/uL (ref 0.0–0.7)
EOS PCT: 0 %
HCT: 35.3 % — ABNORMAL LOW (ref 36.0–46.0)
HEMOGLOBIN: 11.6 g/dL — AB (ref 12.0–15.0)
Lymphocytes Relative: 7 %
Lymphs Abs: 1.1 10*3/uL (ref 0.7–4.0)
MCH: 26.2 pg (ref 26.0–34.0)
MCHC: 32.9 g/dL (ref 30.0–36.0)
MCV: 79.9 fL (ref 78.0–100.0)
MONOS PCT: 7 %
Monocytes Absolute: 1 10*3/uL (ref 0.1–1.0)
NEUTROS ABS: 13.1 10*3/uL — AB (ref 1.7–7.7)
NEUTROS PCT: 86 %
PLATELETS: 175 10*3/uL (ref 150–400)
RBC: 4.42 MIL/uL (ref 3.87–5.11)
RDW: 17.5 % — ABNORMAL HIGH (ref 11.5–15.5)
WBC: 15.3 10*3/uL — AB (ref 4.0–10.5)

## 2016-09-24 LAB — BASIC METABOLIC PANEL
ANION GAP: 10 (ref 5–15)
BUN: 73 mg/dL — ABNORMAL HIGH (ref 6–20)
CALCIUM: 10 mg/dL (ref 8.9–10.3)
CO2: 15 mmol/L — ABNORMAL LOW (ref 22–32)
Chloride: 107 mmol/L (ref 101–111)
Creatinine, Ser: 2.95 mg/dL — ABNORMAL HIGH (ref 0.44–1.00)
GFR, EST AFRICAN AMERICAN: 17 mL/min — AB (ref >60)
GFR, EST NON AFRICAN AMERICAN: 14 mL/min — AB (ref >60)
GLUCOSE: 127 mg/dL — AB (ref 65–99)
Potassium: 5 mmol/L (ref 3.5–5.1)
SODIUM: 132 mmol/L — AB (ref 135–145)

## 2016-09-24 MED ORDER — SODIUM CHLORIDE 0.9 % IV BOLUS (SEPSIS)
1000.0000 mL | Freq: Once | INTRAVENOUS | Status: AC
  Administered 2016-09-25: 1000 mL via INTRAVENOUS

## 2016-09-24 NOTE — ED Notes (Signed)
MD at bedside. 

## 2016-09-24 NOTE — ED Notes (Signed)
Put patient on bedpan to obtain urine specimen, but patient had BM also in the bedpan not able to use sample.

## 2016-09-24 NOTE — ED Triage Notes (Signed)
Pt reports she fell twice today. Pt states she doesn't know what made her fall. Family reports pt is confused, started approx 1 week ago. Pt seen by PCP Juanetta GoslingHawkins on Thursday. Pt has blood work on Friday. Family states pt's confusion comes and goes. Pt states her head is hurting and she hit the back of it when she fell. Pt is on Eliquis.

## 2016-09-24 NOTE — ED Provider Notes (Signed)
[facility]Y [facility]PT Provider Note   CSN: [id number] Arrival date & time: 09/24/16  2136     History   Chief Complaint Chief Complaint  Patient presents with  . Fall    HPI [name] is a 75 y.o. female presents today after 2 falls. History is provided by both patient and daughter, but was significantly limited due to confusion by the patient. Apparently earlier today patient had fallen after eating her feet caught on her bed sheets. No one was there to witness this fall but its her son-in-law was able to help her back up shortly thereafter. Patient then fell later this afternoon. This, again, was unwitnessed but patient denies any trauma at this time. She states that she remembers both events, before, during, and after. She endorses some recent lightheadedness but no vertigo. Some orthostatic symptoms. She endorses some weakness in her legs as well as pain specifically in her left leg. No new swelling. No chest pain. Shortness of breath persists that her baseline.  Daughter states that patient is currently confused. This is worse than her typical baseline but is not dramatically unchanged from yesterday.  HPI  Past Medical History:  Diagnosis Date  . Anemia   . Anginal pain (HCC)    "all the time, but not heart related"  . Arthritis    "legs and back" (09/30/2013)  . Asthma    years ago  . 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 Blurred vision, bilateral    sometimes double vision  . CAD (coronary artery disease)    multivessel, CABG 6/10.  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  Myoview (11/20/11) showed no ischemia & EF 62%  . Chronic lower back pain   . COPD (chronic obstructive pulmonary disease) (HCC)    severe lung disease by PFTs 6/12  . DDD (degenerative disc disease)   . Deep vein thrombosis, upper right extremity (HCC)    post-PPM  . Dehiscence of closure of sternum or sternotomy 07/2009   sternal infection; required flap closure    . Diabetes mellitus, type II (HCC)   . Gastroesophageal reflux disease    not much  . H/O hiatal hernia   . History of kidney stones   . Hyperlipidemia   . Hypertension   . Migraines    "years ago" (09/30/2013)  . Obesity   . Orthopnea    sometimes  . Pacemaker   . PUD (peptic ulcer disease)   . Seizures (HCC)    "long time ago; don't remember what they were related to" (09/30/2013)none recent  . Sleep apnea    denies CPAP use on 09/30/2013  . Stroke [facility])    years ago, "residual on left back"  . Urge incontinence of urine     Patient Active Problem List   Diagnosis Date Noted  . Chest pain 08/29/2016  . Atrial fibrillation (HCC) 02/06/2015  . Encephalopathy, metabolic 10/12/2014  . Intractable nausea and vomiting 10/09/2014  . Epigastric abdominal pain 10/09/2014  . Expected blood loss anemia 12/04/2013  . S/P left THA, AA 12/03/2013  . Protein-calorie malnutrition, severe (HCC) 10/02/2013  . Precordial pain 09/30/2013  . Failure to thrive 07/26/2013  . Altered mental state 07/13/2013  . NSTEMI (non-ST elevated myocardial infarction) (HCC) 07/01/2013  . Acute delirium 06/29/2013  . Fecal impaction (HCC) 06/29/2013  . Total knee replacement status 05/22/2013  . H/O total hip arthroplasty 05/22/2013  . Spinal stenosis, lumbar region, with neurogenic claudication 05/22/2013  .  Osteoarthritis of left hip 05/22/2013  . Noninfectious gastroenteritis and colitis 03/05/2013  . Dehydration 03/05/2013  . Family history of colon cancer 02/15/2013  . PUD (peptic ulcer disease) 02/15/2013  . Diabetes mellitus, type II (HCC)   . Hypertension   . Arteriosclerotic cardiovascular disease (ASCVD)   . Gastroesophageal reflux disease   . Hyperlipidemia   . Deep vein thrombosis, upper right extremity (HCC)   . Obesity   . AV block   . COPD (chronic obstructive pulmonary disease) (HCC)   . Sleep apnea   . Chronic kidney disease (CKD), stage III (moderate)   . Seizure disorder  (HCC)   . Anemia, normocytic normochromic 12/15/2011  . Chronic diastolic heart failure (HCC) 11/18/2011  . PACEMAKER, PERMANENT 05/13/2009  . 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 GASTRIC ULCER 05/12/2009  . Gastroparesis 05/12/2009  . DIVERTICULAR DISEASE 05/12/2009    Past Surgical History:  Procedure Laterality Date  . ABDOMINAL HYSTERECTOMY     partial  . APPENDECTOMY    . BACK SURGERY     x3 total  . CESAREAN SECTION  ~1980  . CHOLECYSTECTOMY    . COLONOSCOPY  04/27/2005   hemorrhoids, diverticula  . COLONOSCOPY WITH ESOPHAGOGASTRODUODENOSCOPY (EGD) N/A 02/20/2013   RMR: pancolonic diverticulosis and internal hemorrhoids. EGD with small hiatal hernia, healed gastric ulcer  . CORONARY ARTERY BYPASS GRAFT  05/1999   - LIMA to LAD, SVG to OM, SVG to PDA  . ESOPHAGOGASTRODUODENOSCOPY     with [facility] dilation,  biopsy, and disruption Schatzki's ring  . ESOPHAGOGASTRODUODENOSCOPY  09/10/2012   RMR: Noncritical Schatzki's ring. Diffuse gastric erosions and an  area of partially healing ulceration-status post biopsy/ Small hiatal hernia. Bx reactive gastropathy.  . INSERT / REPLACE / REMOVE PACEMAKER  1993  . JOINT REPLACEMENT    . LUMBAR DISC SURGERY    . PACEMAKER PLACEMENT  1993   Status post DDD pacemaker implantation in 1993, with Medtronic Kappa generator change in 2003 for  second-degree AV block, most recent gen change by Fawn Kirk 04/14/11  . PORTACATH PLACEMENT Left 07/17/2013   Procedure: INSERTION PORT-A-CATH-left subclavian;  Surgeon: Fabio Bering, MD;  Location: [facility] ORS;  Service: General;  Laterality: Left;  . POSTERIOR LUMBAR FUSION    . RECONSTRUCTIVE REPAIR STERNAL     for infection s/p CABG 8/10  . REPLACEMENT TOTAL KNEE Bilateral   . THYROIDECTOMY    . TOTAL HIP ARTHROPLASTY Right   . TOTAL HIP ARTHROPLASTY Left 12/03/2013   Procedure: LEFT TOTAL HIP ARTHROPLASTY ANTERIOR APPROACH;  Surgeon: Shelda Pal, MD;  Location: [facility];  Service: Orthopedics;  Laterality: Left;    . TUBAL LIGATION    . WRIST SURGERY Left    "tumor taken off" (09/30/2013)    OB History    Gravida Para Term Preterm AB Living   5 5 5          SAB TAB Ectopic Multiple Live Births                   Home Medications    Prior to Admission medications   Medication Sig Start Date End Date Taking? Authorizing Provider  acetaminophen (TYLENOL) 325 MG tablet Take 325-650 mg by mouth every 6 (six) hours as needed for mild pain, moderate pain or headache.    Yes Historical Provider, MD  apixaban (ELIQUIS) 5 MG TABS tablet Take 5 mg by mouth 2 (two) times daily.    Yes Historical Provider, MD  atorvastatin (LIPITOR) 40 MG tablet  Take 40 mg by mouth at bedtime.   Yes Historical Provider, MD  benazepril (LOTENSIN) 40 MG tablet Take 40 mg by mouth daily.    Yes Historical Provider, MD  docusate sodium (COLACE) 100 MG capsule Take 100 mg by mouth daily.   Yes Historical Provider, MD  ferrous sulfate 325 (65 FE) MG tablet Take 325 mg by mouth daily with breakfast.   Yes Historical Provider, MD  ibuprofen (ADVIL,MOTRIN) 200 MG tablet Take 600 mg by mouth every 6 (six) hours as needed for headache, mild pain or moderate pain.   Yes Historical Provider, MD  isosorbide mononitrate (IMDUR) 30 MG 24 hr tablet Take 1 tablet (30 mg total) by mouth daily. 08/31/16  Yes Avon Gully, MD  ketorolac (ACULAR) 0.5 % ophthalmic solution Place 1 drop into the right eye 3 (three) times daily.   Yes Historical Provider, MD  levETIRAcetam (KEPPRA) 500 MG tablet Take 500 mg by mouth 2 (two) times daily.   Yes Historical Provider, MD  lubiprostone (AMITIZA) 24 MCG capsule Take 24 mcg by mouth 2 (two) times daily with a meal.   Yes Historical Provider, MD  Melatonin 3 MG TABS Take 3 mg by mouth at bedtime as needed (for sleep).   Yes Historical Provider, MD  metFORMIN (GLUCOPHAGE) 500 MG tablet Take 500 mg by mouth 2 (two) times daily with a meal.    Yes Historical Provider, MD  metoprolol tartrate (LOPRESSOR) 25 MG  tablet Take 25 mg by mouth 2 (two) times daily.    Yes Historical Provider, MD  nitroGLYCERIN (NITROSTAT) 0.4 MG SL tablet Place 1 tablet (0.4 mg total) under the tongue every 5 (five) minutes as needed for chest pain. 05/20/14  Yes Antoine Poche, MD  omega-3 acid ethyl esters (LOVAZA) 1 G capsule Take 2 g by mouth 2 (two) times daily.    Yes Historical Provider, MD  ondansetron (ZOFRAN-ODT) 4 MG disintegrating tablet Take 4 mg by mouth every 8 (eight) hours as needed for nausea or vomiting.   Yes Historical Provider, MD  pantoprazole (PROTONIX) 40 MG tablet Take 1 tablet (40 mg total) by mouth daily. 12/03/13  Yes Gelene Mink, NP  potassium chloride (K-DUR,KLOR-CON) 10 MEQ tablet Take 10 mEq by mouth daily.   Yes Historical Provider, MD  traZODone (DESYREL) 50 MG tablet Take 50 mg by mouth at bedtime.   Yes Historical Provider, MD    Family History Family History  Problem Relation Age of Onset  . Heart disease Mother     MI  . Colon cancer Father     age 40  . Diabetes    . Coronary artery disease      Female <55  . Cancer      Social History Social History  Substance Use Topics  . Smoking status: Never Smoker  . Smokeless tobacco: Former Neurosurgeon    Types: Snuff     Comment: 09/30/2013 "stopped snuff a long time ago"  . Alcohol use No     Allergies   Codeine; Food; Oxycodone hcl; Peanut-containing drug products; and Reglan [metoclopramide]   Review of Systems Review of Systems  Constitutional: Negative for activity change, chills, diaphoresis, fatigue and fever.  HENT: Negative for congestion, drooling, rhinorrhea, sore throat and trouble swallowing.   Eyes: Negative for photophobia and visual disturbance.  Respiratory: Negative for cough, chest tightness, shortness of breath, wheezing and stridor.   Cardiovascular: Negative for chest pain and palpitations.  Gastrointestinal: Negative for abdominal pain, diarrhea, nausea  and vomiting.  Genitourinary: Negative for  difficulty urinating, dysuria, flank pain and vaginal bleeding.  Skin: Negative for pallor, rash and wound.  Neurological: Positive for dizziness, weakness and light-headedness. Negative for seizures, syncope, facial asymmetry, speech difficulty, numbness and headaches.  Psychiatric/Behavioral: Positive for confusion. Negative for agitation, behavioral problems and hallucinations.     Physical Exam Updated Vital Signs BP 128/63 (BP Location: Left Arm)   Pulse 63   Temp 97.7 F (36.5 C) (Oral)   Resp 16   Ht 5' (1.524 m)   Wt 81.6 kg   SpO2 99%   BMI 35.15 kg/m   Physical Exam  Constitutional: She appears well-developed and well-nourished. No distress.  HENT:  Head: Normocephalic and atraumatic.  Right Ear: External ear normal.  Left Ear: External ear normal.  Nose: Nose normal.  Mouth/Throat: Oropharynx is clear and moist.  Eyes: Conjunctivae and EOM are normal. Pupils are equal, round, and reactive to light.  Neck: Normal range of motion. Neck supple. No JVD present.  Cardiovascular: Normal rate, normal heart sounds and intact distal pulses.   No murmur heard. Pulmonary/Chest: Effort normal and breath sounds normal. No respiratory distress. She has no wheezes. She has no rales. She exhibits no tenderness.  Abdominal: Soft. Bowel sounds are normal. She exhibits no distension. There is no tenderness.  Musculoskeletal: Normal range of motion. She exhibits no edema, tenderness or deformity.  Lymphadenopathy:    She has no cervical adenopathy.  Neurological: She is alert. She has normal strength. She is not disoriented. No cranial nerve deficit or sensory deficit. She exhibits normal muscle tone. She displays a negative Romberg sign.  Skin: Skin is warm and dry. Capillary refill takes 2 to 3 seconds. No rash noted. She is not diaphoretic. No erythema.  Psychiatric: She has a normal mood and affect. Her behavior is normal.     ED Treatments / Results  Labs (all labs ordered  are listed, but only abnormal results are displayed) Labs Reviewed  BASIC METABOLIC PANEL - Abnormal; Notable for the following:       Result Value   Sodium 132 (*)    CO2 15 (*)    Glucose, Bld 127 (*)    BUN 73 (*)    Creatinine, Ser 2.95 (*)    GFR calc non Af Amer 14 (*)    GFR calc Af Amer 17 (*)    All other components within normal limits  CBC WITH DIFFERENTIAL/PLATELET - Abnormal; Notable for the following:    WBC 15.3 (*)    Hemoglobin 11.6 (*)    HCT 35.3 (*)    RDW 17.5 (*)    Neutro Abs 13.1 (*)    All other components within normal limits  URINALYSIS, ROUTINE W REFLEX MICROSCOPIC (NOT AT [facility])    EKG  EKG Interpretation  Date/Time:  Saturday September 24 2016 21:51:02 EDT Ventricular Rate:  67 PR Interval:    QRS Duration: 91 QT Interval:  531 QTC Calculation: 561 R Axis:   -49 Text Interpretation:  irregular rhythm with frequent PVC's LAD, consider left anterior fascicular block Abnormal R-wave progression, early transition Borderline T abnormalities, anterior leads Prolonged QT interval Confirmed by [facility] MD, JASON [phone]) on 09/24/2016 10:08:22 PM       Radiology Ct Head Wo Contrast  Result Date: 09/24/2016 CLINICAL DATA:  Patient fell twice.  Confused. EXAM: CT HEAD WITHOUT CONTRAST TECHNIQUE: Contiguous axial images were obtained from the base of the skull through the vertex without intravenous contrast.  COMPARISON:  CT from 07/22/2013 FINDINGS: BRAIN: The ventricles and sulci are normal for age. No intraparenchymal hemorrhage, mass effect nor midline shift. Some progression of chronic small vessel ischemic disease since prior exam involving the periventricular and subcortical white matter. No acute large vascular territory infarcts. No abnormal extra-axial fluid collections. Basal cisterns are patent. VASCULAR: Moderate calcific atherosclerosis of the carotid siphons. SKULL: No skull fracture. No significant scalp soft tissue swelling. SINUSES/ORBITS: The  mastoid air-cells and included paranasal sinuses are well-aerated.The included ocular globes and orbital contents are non-suspicious. OTHER: None. IMPRESSION: Chronic small vessel ischemic disease, progressed since 2014. No acute intracranial abnormality identified. Electronically Signed   By: Tollie Ethavid  Kwon M.D.   On: 09/24/2016 23:26    Procedures Procedures (including critical care time)  Medications Ordered in ED Medications  sodium chloride 0.9 % bolus 1,000 mL (not administered)     Initial Impression / Assessment and Plan / ED Course  I have reviewed the triage vital signs and the nursing notes.  Pertinent labs & imaging results that were available during my care of the patient were reviewed by me and considered in my medical decision making (see chart for details).  Clinical Course   Falls; dehydration versus infectious process: Patient is here for his. She suffered 2 unwitnessed falls earlier today. Family endorsed confusion which is worse than her baseline. Head CT negative for acute process. Labs yielded leukocytosis of 15.3. Hemoglobin 11.6. MCV 79.9. Sodium 132. Bicarbonate 15. BUN 7. Creatinine 0.95 (up from 1.61). Urinalysis still pending.  Decision was made to admit for observation due to confusion, dehydration and AKI.  Final Clinical Impressions(s) / ED Diagnoses   Final diagnoses:  Fall, initial encounter  AKI (acute kidney injury) [facility](HCC)    New Prescriptions New Prescriptions   No medications on file     Kathee DeltonIan D Xaden Kaufman, MD 09/24/16 2358    Marily MemosJason Mesner, MD 09/25/16 1650

## 2016-09-25 DIAGNOSIS — E785 Hyperlipidemia, unspecified: Secondary | ICD-10-CM | POA: Diagnosis present

## 2016-09-25 DIAGNOSIS — W0110XA Fall on same level from slipping, tripping and stumbling with subsequent striking against unspecified object, initial encounter: Secondary | ICD-10-CM | POA: Diagnosis present

## 2016-09-25 DIAGNOSIS — K219 Gastro-esophageal reflux disease without esophagitis: Secondary | ICD-10-CM | POA: Diagnosis present

## 2016-09-25 DIAGNOSIS — K3184 Gastroparesis: Secondary | ICD-10-CM | POA: Diagnosis present

## 2016-09-25 DIAGNOSIS — K449 Diaphragmatic hernia without obstruction or gangrene: Secondary | ICD-10-CM | POA: Diagnosis present

## 2016-09-25 DIAGNOSIS — Z9181 History of falling: Secondary | ICD-10-CM | POA: Diagnosis not present

## 2016-09-25 DIAGNOSIS — I5032 Chronic diastolic (congestive) heart failure: Secondary | ICD-10-CM | POA: Diagnosis present

## 2016-09-25 DIAGNOSIS — E1122 Type 2 diabetes mellitus with diabetic chronic kidney disease: Secondary | ICD-10-CM | POA: Diagnosis present

## 2016-09-25 DIAGNOSIS — S060X0A Concussion without loss of consciousness, initial encounter: Secondary | ICD-10-CM | POA: Diagnosis present

## 2016-09-25 DIAGNOSIS — I130 Hypertensive heart and chronic kidney disease with heart failure and stage 1 through stage 4 chronic kidney disease, or unspecified chronic kidney disease: Secondary | ICD-10-CM | POA: Diagnosis present

## 2016-09-25 DIAGNOSIS — N183 Chronic kidney disease, stage 3 unspecified: Secondary | ICD-10-CM | POA: Diagnosis present

## 2016-09-25 DIAGNOSIS — N179 Acute kidney failure, unspecified: Secondary | ICD-10-CM | POA: Diagnosis present

## 2016-09-25 DIAGNOSIS — E872 Acidosis: Secondary | ICD-10-CM | POA: Diagnosis present

## 2016-09-25 DIAGNOSIS — W19XXXA Unspecified fall, initial encounter: Secondary | ICD-10-CM | POA: Diagnosis present

## 2016-09-25 DIAGNOSIS — E669 Obesity, unspecified: Secondary | ICD-10-CM | POA: Diagnosis present

## 2016-09-25 DIAGNOSIS — R300 Dysuria: Secondary | ICD-10-CM | POA: Diagnosis present

## 2016-09-25 DIAGNOSIS — M1990 Unspecified osteoarthritis, unspecified site: Secondary | ICD-10-CM | POA: Diagnosis present

## 2016-09-25 DIAGNOSIS — E1143 Type 2 diabetes mellitus with diabetic autonomic (poly)neuropathy: Secondary | ICD-10-CM | POA: Diagnosis present

## 2016-09-25 DIAGNOSIS — G4730 Sleep apnea, unspecified: Secondary | ICD-10-CM | POA: Diagnosis present

## 2016-09-25 DIAGNOSIS — E860 Dehydration: Secondary | ICD-10-CM | POA: Diagnosis present

## 2016-09-25 DIAGNOSIS — J449 Chronic obstructive pulmonary disease, unspecified: Secondary | ICD-10-CM | POA: Diagnosis present

## 2016-09-25 DIAGNOSIS — I2510 Atherosclerotic heart disease of native coronary artery without angina pectoris: Secondary | ICD-10-CM | POA: Diagnosis present

## 2016-09-25 DIAGNOSIS — Z23 Encounter for immunization: Secondary | ICD-10-CM | POA: Diagnosis not present

## 2016-09-25 DIAGNOSIS — E871 Hypo-osmolality and hyponatremia: Secondary | ICD-10-CM | POA: Diagnosis present

## 2016-09-25 DIAGNOSIS — I4891 Unspecified atrial fibrillation: Secondary | ICD-10-CM | POA: Diagnosis present

## 2016-09-25 DIAGNOSIS — G40909 Epilepsy, unspecified, not intractable, without status epilepticus: Secondary | ICD-10-CM | POA: Diagnosis present

## 2016-09-25 LAB — GLUCOSE, CAPILLARY
GLUCOSE-CAPILLARY: 92 mg/dL (ref 65–99)
GLUCOSE-CAPILLARY: 138 mg/dL — AB (ref 65–99)
GLUCOSE-CAPILLARY: 123 mg/dL — AB (ref 65–99)

## 2016-09-25 LAB — COMPREHENSIVE METABOLIC PANEL
ALT: 14 U/L (ref 14–54)
AST: 25 U/L (ref 15–41)
Albumin: 3.1 g/dL — ABNORMAL LOW (ref 3.5–5.0)
Alkaline Phosphatase: 38 U/L (ref 38–126)
Anion gap: 9 (ref 5–15)
BILIRUBIN TOTAL: 0.4 mg/dL (ref 0.3–1.2)
BUN: 67 mg/dL — AB (ref 6–20)
CALCIUM: 9.4 mg/dL (ref 8.9–10.3)
CO2: 13 mmol/L — ABNORMAL LOW (ref 22–32)
CREATININE: 2.46 mg/dL — AB (ref 0.44–1.00)
Chloride: 113 mmol/L — ABNORMAL HIGH (ref 101–111)
GFR, EST AFRICAN AMERICAN: 21 mL/min — AB (ref >60)
GFR, EST NON AFRICAN AMERICAN: 18 mL/min — AB (ref >60)
Glucose, Bld: 112 mg/dL — ABNORMAL HIGH (ref 65–99)
Potassium: 4.5 mmol/L (ref 3.5–5.1)
Sodium: 135 mmol/L (ref 135–145)
TOTAL PROTEIN: 6.5 g/dL (ref 6.5–8.1)

## 2016-09-25 LAB — CBC
HEMATOCRIT: 30.9 % — AB (ref 36.0–46.0)
Hemoglobin: 10.2 g/dL — ABNORMAL LOW (ref 12.0–15.0)
MCH: 26.2 pg (ref 26.0–34.0)
MCHC: 33 g/dL (ref 30.0–36.0)
MCV: 79.2 fL (ref 78.0–100.0)
Platelets: 153 10*3/uL (ref 150–400)
RBC: 3.9 MIL/uL (ref 3.87–5.11)
RDW: 17.4 % — ABNORMAL HIGH (ref 11.5–15.5)
WBC: 14.7 10*3/uL — AB (ref 4.0–10.5)

## 2016-09-25 LAB — URINALYSIS, ROUTINE W REFLEX MICROSCOPIC
BILIRUBIN URINE: NEGATIVE
Glucose, UA: NEGATIVE mg/dL
HGB URINE DIPSTICK: NEGATIVE
Ketones, ur: NEGATIVE mg/dL
Leukocytes, UA: NEGATIVE
NITRITE: NEGATIVE
PROTEIN: NEGATIVE mg/dL
pH: 6 (ref 5.0–8.0)

## 2016-09-25 MED ORDER — PNEUMOCOCCAL VAC POLYVALENT 25 MCG/0.5ML IJ INJ
0.5000 mL | INJECTION | INTRAMUSCULAR | Status: AC
  Administered 2016-09-25: 0.5 mL via INTRAMUSCULAR
  Filled 2016-09-25: qty 0.5

## 2016-09-25 MED ORDER — INSULIN ASPART 100 UNIT/ML ~~LOC~~ SOLN
0.0000 [IU] | Freq: Three times a day (TID) | SUBCUTANEOUS | Status: DC
  Administered 2016-09-25: 1 [IU] via SUBCUTANEOUS
  Administered 2016-09-26: 2 [IU] via SUBCUTANEOUS
  Administered 2016-09-28: 1 [IU] via SUBCUTANEOUS

## 2016-09-25 MED ORDER — ISOSORBIDE MONONITRATE ER 60 MG PO TB24
30.0000 mg | ORAL_TABLET | Freq: Every day | ORAL | Status: DC
  Administered 2016-09-25 – 2016-09-28 (×4): 30 mg via ORAL
  Filled 2016-09-25 (×4): qty 1

## 2016-09-25 MED ORDER — INFLUENZA VAC SPLIT QUAD 0.5 ML IM SUSY
0.5000 mL | PREFILLED_SYRINGE | INTRAMUSCULAR | Status: AC
  Administered 2016-09-25: 0.5 mL via INTRAMUSCULAR
  Filled 2016-09-25: qty 0.5

## 2016-09-25 MED ORDER — APIXABAN 5 MG PO TABS
5.0000 mg | ORAL_TABLET | Freq: Two times a day (BID) | ORAL | Status: DC
  Administered 2016-09-25 – 2016-09-28 (×7): 5 mg via ORAL
  Filled 2016-09-25 (×7): qty 1

## 2016-09-25 MED ORDER — ONDANSETRON HCL 4 MG PO TABS
4.0000 mg | ORAL_TABLET | Freq: Four times a day (QID) | ORAL | Status: DC | PRN
  Administered 2016-09-26: 4 mg via ORAL
  Filled 2016-09-25: qty 1

## 2016-09-25 MED ORDER — KETOROLAC TROMETHAMINE 0.5 % OP SOLN
1.0000 [drp] | Freq: Three times a day (TID) | OPHTHALMIC | Status: DC
  Administered 2016-09-25 – 2016-09-28 (×10): 1 [drp] via OPHTHALMIC
  Filled 2016-09-25: qty 3

## 2016-09-25 MED ORDER — ATORVASTATIN CALCIUM 40 MG PO TABS
40.0000 mg | ORAL_TABLET | Freq: Every day | ORAL | Status: DC
  Administered 2016-09-25 – 2016-09-27 (×3): 40 mg via ORAL
  Filled 2016-09-25 (×3): qty 1

## 2016-09-25 MED ORDER — LUBIPROSTONE 24 MCG PO CAPS
24.0000 ug | ORAL_CAPSULE | Freq: Two times a day (BID) | ORAL | Status: DC
  Administered 2016-09-25 – 2016-09-28 (×7): 24 ug via ORAL
  Filled 2016-09-25 (×7): qty 1

## 2016-09-25 MED ORDER — LEVETIRACETAM 500 MG PO TABS
500.0000 mg | ORAL_TABLET | Freq: Two times a day (BID) | ORAL | Status: DC
  Administered 2016-09-25 – 2016-09-28 (×7): 500 mg via ORAL
  Filled 2016-09-25 (×7): qty 1

## 2016-09-25 MED ORDER — ONDANSETRON HCL 4 MG/2ML IJ SOLN: 4.0000 mg | Freq: Four times a day (QID) | INTRAMUSCULAR | Status: DC | PRN

## 2016-09-25 MED ORDER — ACETAMINOPHEN 325 MG PO TABS
325.0000 mg | ORAL_TABLET | Freq: Four times a day (QID) | ORAL | Status: DC | PRN
  Administered 2016-09-26 – 2016-09-27 (×3): 650 mg via ORAL
  Filled 2016-09-25 (×3): qty 2

## 2016-09-25 MED ORDER — POTASSIUM CHLORIDE CRYS ER 10 MEQ PO TBCR
10.0000 meq | EXTENDED_RELEASE_TABLET | Freq: Every day | ORAL | Status: DC
  Administered 2016-09-25 – 2016-09-28 (×4): 10 meq via ORAL
  Filled 2016-09-25 (×4): qty 1

## 2016-09-25 MED ORDER — FERROUS SULFATE 325 (65 FE) MG PO TABS
325.0000 mg | ORAL_TABLET | Freq: Every day | ORAL | Status: DC
  Administered 2016-09-25 – 2016-09-28 (×4): 325 mg via ORAL
  Filled 2016-09-25 (×4): qty 1

## 2016-09-25 MED ORDER — ENSURE ENLIVE PO LIQD
237.0000 mL | Freq: Two times a day (BID) | ORAL | Status: DC
  Administered 2016-09-25 – 2016-09-28 (×5): 237 mL via ORAL

## 2016-09-25 MED ORDER — TRAZODONE HCL 50 MG PO TABS
50.0000 mg | ORAL_TABLET | Freq: Every day | ORAL | Status: DC
  Administered 2016-09-25 – 2016-09-27 (×3): 50 mg via ORAL
  Filled 2016-09-25 (×3): qty 1

## 2016-09-25 MED ORDER — OMEGA-3-ACID ETHYL ESTERS 1 G PO CAPS
2.0000 g | ORAL_CAPSULE | Freq: Two times a day (BID) | ORAL | Status: DC
  Administered 2016-09-25 – 2016-09-28 (×7): 2 g via ORAL
  Filled 2016-09-25 (×7): qty 2

## 2016-09-25 MED ORDER — PANTOPRAZOLE SODIUM 40 MG PO TBEC
40.0000 mg | DELAYED_RELEASE_TABLET | Freq: Every day | ORAL | Status: DC
  Administered 2016-09-25 – 2016-09-28 (×4): 40 mg via ORAL
  Filled 2016-09-25 (×4): qty 1

## 2016-09-25 MED ORDER — SODIUM CHLORIDE 0.9 % IV SOLN
INTRAVENOUS | Status: DC
  Administered 2016-09-25 – 2016-09-26 (×4): via INTRAVENOUS

## 2016-09-25 NOTE — H&P (Addendum)
TRH H&P    Patient Demographics:    [name], is a 75 y.o. female  MRN: [id number]  DOB - 1941-11-21  Admit Date - 09/24/2016  Referring MD/NP/PA: Dr Wende Mott  Outpatient Primary MD for the patient is Fredirick Maudlin, MD  Patient coming from: Home  Chief Complaint  Patient presents with  . Fall      HPI:    [name]  is a 75 y.o. female, With history of CAD status post CABG, COPD, hypertension, diabetes mellitus who came to the hospital after patient had 2 falls at home. Patient has mild confusion so history is limited and most of history provided by patient's daughter at bedside. Patient fell on Saturday which was unwitnessed but patient denies loss of consciousness. And today again patient fell later this afternoon.  She denies shortness of breath but admits to having some chest pain 2 days ago. She denies chest pain at this time She denies nausea vomiting or diarrhea. She admits to having dysuria. UA is currently pending  In the ED patient found to be in acute kidney injury/dehydration, admitted for IV fluids.    Review of systems:      A full 10 point Review of Systems was done, except as stated above, all other Review of Systems were negative.   With Past History of the following :    Past Medical History:  Diagnosis Date  . Anemia   . Anginal pain (HCC)    "all the time, but not heart related"  . Arthritis    "legs and back" (09/30/2013)  . Asthma    years ago  . 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 Blurred vision, bilateral    sometimes double vision  . CAD (coronary artery disease)    multivessel, CABG 6/10.  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  Myoview (11/20/11) showed no ischemia & EF 62%  . Chronic lower back pain   . COPD (chronic obstructive pulmonary disease) (HCC)    severe lung disease by PFTs 6/12  . DDD (degenerative disc  disease)   . Deep vein thrombosis, upper right extremity (HCC)    post-PPM  . Dehiscence of closure of sternum or sternotomy 07/2009   sternal infection; required flap closure  . Diabetes mellitus, type II (HCC)   . Gastroesophageal reflux disease    not much  . H/O hiatal hernia   . History of kidney stones   . Hyperlipidemia   . Hypertension   . Migraines    "years ago" (09/30/2013)  . Obesity   . Orthopnea    sometimes  . Pacemaker   . PUD (peptic ulcer disease)   . Seizures (HCC)    "long time ago; don't remember what they were related to" (09/30/2013)none recent  . Sleep apnea    denies CPAP use on 09/30/2013  . 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 Urge incontinence of urine       Past Surgical History:  Procedure  Laterality Date  . ABDOMINAL HYSTERECTOMY     partial  . APPENDECTOMY    . BACK SURGERY     x3 total  . CESAREAN SECTION  ~1980  . CHOLECYSTECTOMY    . COLONOSCOPY  04/27/2005   hemorrhoids, diverticula  . COLONOSCOPY WITH ESOPHAGOGASTRODUODENOSCOPY (EGD) N/A 02/20/2013   RMR: pancolonic diverticulosis and internal hemorrhoids. EGD with small hiatal hernia, healed gastric ulcer  . CORONARY ARTERY BYPASS GRAFT  05/1999   - LIMA to LAD, SVG to OM, SVG to PDA  . ESOPHAGOGASTRODUODENOSCOPY     [facility],  biopsy, and disruption Schatzki's ring  . ESOPHAGOGASTRODUODENOSCOPY  09/10/2012   RMR: Noncritical Schatzki's ring. Diffuse gastric erosions and an  area of partially healing ulceration-status post biopsy/ Small hiatal hernia. Bx reactive gastropathy.  . INSERT / REPLACE / REMOVE PACEMAKER  1993  . JOINT REPLACEMENT    . LUMBAR DISC SURGERY    . PACEMAKER PLACEMENT  1993   Status post DDD pacemaker implantation in 1993, with Medtronic Kappa generator change in 2003 for  second-degree AV block, most recent gen change by Fawn KirkJA 04/14/11  . PORTACATH PLACEMENT Left 07/17/2013   Procedure: INSERTION PORT-A-CATH-left subclavian;  Surgeon:  Fabio BeringBrent C Ziegler, MD;  Location: [facility];  Service: General;  Laterality: Left;  . POSTERIOR LUMBAR FUSION    . RECONSTRUCTIVE REPAIR STERNAL     for infection s/p CABG 8/10  . REPLACEMENT TOTAL KNEE Bilateral   . THYROIDECTOMY    . TOTAL HIP ARTHROPLASTY Right   . TOTAL HIP ARTHROPLASTY Left 12/03/2013   Procedure: LEFT TOTAL HIP ARTHROPLASTY ANTERIOR APPROACH;  Surgeon: Shelda PalMatthew D Olin, MD;  Location: [facility];  Service: Orthopedics;  Laterality: Left;  . TUBAL LIGATION    . WRIST SURGERY Left    "tumor taken off" (09/30/2013)      Social History:      Social History  Substance Use Topics  . Smoking status: Never Smoker  . Smokeless tobacco: Former NeurosurgeonUser    Types: Snuff     Comment: 09/30/2013 "stopped snuff a long time ago"  . Alcohol use No       Family History :     Family History  Problem Relation Age of Onset  . Heart disease Mother     MI  . Colon cancer Father     age 77  . Diabetes    . Coronary artery disease      Female <55  . Cancer        Home Medications:   Prior to Admission medications   Medication Sig Start Date End Date Taking? Authorizing Provider  acetaminophen (TYLENOL) 325 MG tablet Take 325-650 mg by mouth every 6 (six) hours as needed for mild pain, moderate pain or headache.    Yes Historical Provider, MD  apixaban (ELIQUIS) 5 MG TABS tablet Take 5 mg by mouth 2 (two) times daily.    Yes Historical Provider, MD  atorvastatin (LIPITOR) 40 MG tablet Take 40 mg by mouth at bedtime.   Yes Historical Provider, MD  benazepril (LOTENSIN) 40 MG tablet Take 40 mg by mouth daily.    Yes Historical Provider, MD  docusate sodium (COLACE) 100 MG capsule Take 100 mg by mouth daily.   Yes Historical Provider, MD  ferrous sulfate 325 (65 FE) MG tablet Take 325 mg by mouth daily with breakfast.   Yes Historical Provider, MD  ibuprofen (ADVIL,MOTRIN) 200 MG tablet Take 600 mg by mouth every 6 (six) hours  as needed for headache, mild pain or moderate pain.   Yes  Historical Provider, MD  isosorbide mononitrate (IMDUR) 30 MG 24 hr tablet Take 1 tablet (30 mg total) by mouth daily. 08/31/16  Yes Avon Gully, MD  ketorolac (ACULAR) 0.5 % ophthalmic solution Place 1 drop into the right eye 3 (three) times daily.   Yes Historical Provider, MD  levETIRAcetam (KEPPRA) 500 MG tablet Take 500 mg by mouth 2 (two) times daily.   Yes Historical Provider, MD  lubiprostone (AMITIZA) 24 MCG capsule Take 24 mcg by mouth 2 (two) times daily with a meal.   Yes Historical Provider, MD  Melatonin 3 MG TABS Take 3 mg by mouth at bedtime as needed (for sleep).   Yes Historical Provider, MD  metFORMIN (GLUCOPHAGE) 500 MG tablet Take 500 mg by mouth 2 (two) times daily with a meal.    Yes Historical Provider, MD  metoprolol tartrate (LOPRESSOR) 25 MG tablet Take 25 mg by mouth 2 (two) times daily.    Yes Historical Provider, MD  nitroGLYCERIN (NITROSTAT) 0.4 MG SL tablet Place 1 tablet (0.4 mg total) under the tongue every 5 (five) minutes as needed for chest pain. 05/20/14  Yes Antoine Poche, MD  omega-3 acid ethyl esters (LOVAZA) 1 G capsule Take 2 g by mouth 2 (two) times daily.    Yes Historical Provider, MD  ondansetron (ZOFRAN-ODT) 4 MG disintegrating tablet Take 4 mg by mouth every 8 (eight) hours as needed for nausea or vomiting.   Yes Historical Provider, MD  pantoprazole (PROTONIX) 40 MG tablet Take 1 tablet (40 mg total) by mouth daily. 12/03/13  Yes Gelene Mink, NP  potassium chloride (K-DUR,KLOR-CON) 10 MEQ tablet Take 10 mEq by mouth daily.   Yes Historical Provider, MD  traZODone (DESYREL) 50 MG tablet Take 50 mg by mouth at bedtime.   Yes Historical Provider, MD     Allergies:     Allergies  Allergen Reactions  . Codeine Other (See Comments)    Reaction:  Headaches   . Food Nausea And Vomiting and Other (See Comments)    Pt is allergic to leafy raw vegetables and seeds.    . Oxycodone Hcl Other (See Comments)    Reaction:  Headaches   .  Peanut-Containing Drug Products Nausea And Vomiting  . Reglan [Metoclopramide] Other (See Comments)    Reaction:  Confusion      Physical Exam:   Vitals  Blood pressure 128/63, pulse 63, temperature 97.7 F (36.5 C), temperature source Oral, resp. rate 16, height 5' (1.524 m), weight 81.6 kg (180 lb), SpO2 99 %.  1.  General: Elderly African-American female in no acute distress  2. Psychiatric: Mild confusion  3. Neurologic: No focal neurological deficits, all cranial nerves intact.Strength 5/5 all 4 extremities, sensation intact all 4 extremities, plantars down going.  4. Eyes :  anicteric sclerae, moist conjunctivae with no lid lag. PERRLA.  5. ENMT:  Oropharynx clear with moist mucous membranes and good dentition  6. Neck:  supple, no cervical lymphadenopathy appriciated, No thyromegaly  7. Respiratory : Normal respiratory effort, good air movement bilaterally,clear to  auscultation bilaterally  8. Cardiovascular : RRR, no gallops, rubs or murmurs, no leg edema  9. Gastrointestinal:  Positive bowel sounds, abdomen soft, non-tender to palpation,no hepatosplenomegaly, no rigidity or guarding       10. Skin:  No cyanosis, normal texture and turgor, no rash, lesions or ulcers  11.Musculoskeletal:  Good muscle tone,  joints appear  normal , no effusions,  normal range of motion    Data Review:    CBC  Recent Labs Lab 09/24/16 2218  WBC 15.3*  HGB 11.6*  HCT 35.3*  PLT 175  MCV 79.9  MCH 26.2  MCHC 32.9  RDW 17.5*  LYMPHSABS 1.1  MONOABS 1.0  EOSABS 0.0  BASOSABS 0.0   ------------------------------------------------------------------------------------------------------------------  Chemistries   Recent Labs Lab 09/24/16 2218  NA 132*  K 5.0  CL 107  CO2 15*  GLUCOSE 127*  BUN 73*  CREATININE 2.95*  CALCIUM 10.0    ------------------------------------------------------------------------------------------------------------------   ---------------------------------------    Imaging Results:    Ct Head Wo Contrast  Result Date: 09/24/2016 CLINICAL DATA:  Patient fell twice.  Confused. EXAM: CT HEAD WITHOUT CONTRAST TECHNIQUE: Contiguous axial images were obtained from the base of the skull through the vertex without intravenous contrast. COMPARISON:  CT from 07/22/2013 FINDINGS: BRAIN: The ventricles and sulci are normal for age. No intraparenchymal hemorrhage, mass effect nor midline shift. Some progression of chronic small vessel ischemic disease since prior exam involving the periventricular and subcortical white matter. No acute large vascular territory infarcts. No abnormal extra-axial fluid collections. Basal cisterns are patent. VASCULAR: Moderate calcific atherosclerosis of the carotid siphons. SKULL: No skull fracture. No significant scalp soft tissue swelling. SINUSES/ORBITS: The mastoid air-cells and included paranasal sinuses are well-aerated.The included ocular globes and orbital contents are non-suspicious. OTHER: None. IMPRESSION: Chronic small vessel ischemic disease, progressed since 2014. No acute intracranial abnormality identified. Electronically Signed   By: Tollie Eth M.D.   On: 09/24/2016 23:26    My personal review of EKG: Rhythm NSR, with multiple PVCs   Assessment & Plan:   Acute kidney injury Diabetes mellitus Hypertension  1. Acute kidney injury- patient presenting with dehydration/acute kidney injury, with BUN/creatinine of 73/2.95. Will admit for IV fluids. Follow BMP in a.m. patient does have mild acidosis with bicarbonate 15. 2. Fall- likely from dehydration, no history of loss of consciousness. Will monitor on telemetry. Once patient stable consider physical therapy evaluation 3. Dysuria- patient complains of dysuria, UA has been ordered and is currently pending. Will  need to follow urine analysis and start appropriate antibiotics as needed 4. Leukocytosis- WBC 15,000, has been afebrile. UA obtained as above.  5. Diabetes mellitus- start sliding scale insulin with NovoLog. Hold metformin 6. Atrial fibrillation - heart rate is controlled, continue metoprolol, anticoagulation with eliquis 7. Hyponatremia- sodium is 132, likely from dehydration. Will follow BMP in a.m.   DVT Prophylaxis-  Eliquis  AM Labs Ordered, also please review Full Orders  Family Communication: Admission, patients condition and plan of care including tests being ordered have been discussed with the patient and her daughter at bedside* who indicate understanding and agree with the plan and Code Status.  Code Status:  Full code  Admission status: Observation  Time spent in minutes : 60 minutes   Alizza Sacra S M.D on 09/25/2016 at 12:28 AM  Between 7am to 7pm - Pager - [phone]. After 7pm go to [email or web address] - password [facility]  [facility] - Office  [phone]

## 2016-09-25 NOTE — Progress Notes (Signed)
This is an assumption of care note. She was admitted early this morning with dehydration, acute kidney injury and multiple falls at home. I had seen her in my office last week and it seemed that her problems started when she was placed on isosorbide. I had cut the dose which helped but she continued to feel bad. She was able to get some appetite back was able to eat and drink more for the last several days but then started having trouble with falling. She lives at home with her daughter.  Exam shows that she appears to be weak. No edema. Mucous membranes are moist  Plan to continue IV fluids. She should be full admission on observation. PT consultation. I'm concerned that she may require placement for rehabilitation

## 2016-09-25 NOTE — ED Notes (Signed)
Port assessed first attempt, blood return noted and infusing without diffuculty

## 2016-09-26 ENCOUNTER — Encounter (HOSPITAL_COMMUNITY): Payer: Self-pay | Admitting: Student

## 2016-09-26 LAB — BASIC METABOLIC PANEL
Anion gap: 6 (ref 5–15)
BUN: 48 mg/dL — ABNORMAL HIGH (ref 6–20)
CALCIUM: 9.2 mg/dL (ref 8.9–10.3)
CO2: 16 mmol/L — AB (ref 22–32)
CREATININE: 1.87 mg/dL — AB (ref 0.44–1.00)
Chloride: 116 mmol/L — ABNORMAL HIGH (ref 101–111)
GFR calc Af Amer: 29 mL/min — ABNORMAL LOW (ref >60)
GFR calc non Af Amer: 25 mL/min — ABNORMAL LOW (ref >60)
GLUCOSE: 104 mg/dL — AB (ref 65–99)
Potassium: 4 mmol/L (ref 3.5–5.1)
Sodium: 138 mmol/L (ref 135–145)

## 2016-09-26 LAB — GLUCOSE, CAPILLARY
Glucose-Capillary: 85 mg/dL (ref 65–99)
Glucose-Capillary: 158 mg/dL — ABNORMAL HIGH (ref 65–99)
Glucose-Capillary: 105 mg/dL — ABNORMAL HIGH (ref 65–99)
Glucose-Capillary: 111 mg/dL — ABNORMAL HIGH (ref 65–99)

## 2016-09-26 LAB — HEMOGLOBIN A1C
HEMOGLOBIN A1C: 6.3 % — AB (ref 4.8–5.6)
MEAN PLASMA GLUCOSE: 134 mg/dL

## 2016-09-26 NOTE — Progress Notes (Signed)
Subjective: She says she feels better. She complains of headache. She is not sure if she hit her head when she fell. She does complain of some discomfort in the back of her head. No blurred vision. No nausea now. She says she feels like she can eat. No chest pain. The headache that she is complaining of is different than her nitrate related headache. No abdominal pain no urinary symptoms and no cough.  Objective: Vital signs in last 24 hours: Temp:  [97.6 F (36.4 C)-98.6 F (37 C)] 97.6 F (36.4 C) (10/23 0701) Pulse Rate:  [60-74] 60 (10/23 0701) Resp:  [16-20] 18 (10/23 0701) BP: (122-167)/(64-84) 122/78 (10/23 0701) SpO2:  [99 %-100 %] 99 % (10/23 0500) Weight change:  Last BM Date: 09/24/16  Intake/Output from previous day: 10/22 0701 - 10/23 0700 In: 840 [P.O.:840] Out: 1800 [Urine:1800]  PHYSICAL EXAM General appearance: alert, cooperative and mild distress Resp: clear to auscultation bilaterally Cardio: Her heart is regular. She has a systolic heart murmur which is unchanged. No edema of the extremities GI: soft, non-tender; bowel sounds normal; no masses,  no organomegaly Extremities: extremities normal, atraumatic, no cyanosis or edema Mucous membranes are much better. Pupils are reactive.  Lab Results:  Results for orders placed or performed during the hospital encounter of 09/24/16 (from the past 48 hour(s))  Basic metabolic panel     Status: Abnormal   Collection Time: 09/24/16 10:18 PM  Result Value Ref Range   Sodium 132 (L) 135 - 145 mmol/L   Potassium 5.0 3.5 - 5.1 mmol/L   Chloride 107 101 - 111 mmol/L   CO2 15 (L) 22 - 32 mmol/L   Glucose, Bld 127 (H) 65 - 99 mg/dL   BUN 73 (H) 6 - 20 mg/dL   Creatinine, Ser 2.95 (H) 0.44 - 1.00 mg/dL   Calcium 10.0 8.9 - 10.3 mg/dL   GFR calc non Af Amer 14 (L) >60 mL/min   GFR calc Af Amer 17 (L) >60 mL/min    Comment: (NOTE) The eGFR has been calculated using the CKD EPI equation. This calculation has not been  validated in all clinical situations. eGFR's persistently <60 mL/min signify possible Chronic Kidney Disease.    Anion gap 10 5 - 15  CBC with Differential     Status: Abnormal   Collection Time: 09/24/16 10:18 PM  Result Value Ref Range   WBC 15.3 (H) 4.0 - 10.5 K/uL   RBC 4.42 3.87 - 5.11 MIL/uL   Hemoglobin 11.6 (L) 12.0 - 15.0 g/dL   HCT 35.3 (L) 36.0 - 46.0 %   MCV 79.9 78.0 - 100.0 fL   MCH 26.2 26.0 - 34.0 pg   MCHC 32.9 30.0 - 36.0 g/dL   RDW 17.5 (H) 11.5 - 15.5 %   Platelets 175 150 - 400 K/uL   Neutrophils Relative % 86 %   Neutro Abs 13.1 (H) 1.7 - 7.7 K/uL   Lymphocytes Relative 7 %   Lymphs Abs 1.1 0.7 - 4.0 K/uL   Monocytes Relative 7 %   Monocytes Absolute 1.0 0.1 - 1.0 K/uL   Eosinophils Relative 0 %   Eosinophils Absolute 0.0 0.0 - 0.7 K/uL   Basophils Relative 0 %   Basophils Absolute 0.0 0.0 - 0.1 K/uL  Urinalysis, Routine w reflex microscopic     Status: Abnormal   Collection Time: 09/25/16 12:30 AM  Result Value Ref Range   Color, Urine YELLOW YELLOW   APPearance CLEAR CLEAR   Specific  Gravity, Urine <1.005 (L) 1.005 - 1.030   pH 6.0 5.0 - 8.0   Glucose, UA NEGATIVE NEGATIVE mg/dL   Hgb urine dipstick NEGATIVE NEGATIVE   Bilirubin Urine NEGATIVE NEGATIVE   Ketones, ur NEGATIVE NEGATIVE mg/dL   Protein, ur NEGATIVE NEGATIVE mg/dL   Nitrite NEGATIVE NEGATIVE   Leukocytes, UA NEGATIVE NEGATIVE    Comment: MICROSCOPIC NOT DONE ON URINES WITH NEGATIVE PROTEIN, BLOOD, LEUKOCYTES, NITRITE, OR GLUCOSE <1000 mg/dL.  Hemoglobin A1c     Status: Abnormal   Collection Time: 09/25/16  5:29 AM  Result Value Ref Range   Hgb A1c MFr Bld 6.3 (H) 4.8 - 5.6 %    Comment: (NOTE)         Pre-diabetes: 5.7 - 6.4         Diabetes: >6.4         Glycemic control for adults with diabetes: <7.0    Mean Plasma Glucose 134 mg/dL    Comment: (NOTE) Performed At: [facility] [place], [place] [id number] Lindon Romp MD JO:[id number]   CBC      Status: Abnormal   Collection Time: 09/25/16  5:29 AM  Result Value Ref Range   WBC 14.7 (H) 4.0 - 10.5 K/uL   RBC 3.90 3.87 - 5.11 MIL/uL   Hemoglobin 10.2 (L) 12.0 - 15.0 g/dL   HCT 30.9 (L) 36.0 - 46.0 %   MCV 79.2 78.0 - 100.0 fL   MCH 26.2 26.0 - 34.0 pg   MCHC 33.0 30.0 - 36.0 g/dL   RDW 17.4 (H) 11.5 - 15.5 %   Platelets 153 150 - 400 K/uL    Comment: SPECIMEN CHECKED FOR CLOTS PLATELET COUNT CONFIRMED BY SMEAR   Comprehensive metabolic panel     Status: Abnormal   Collection Time: 09/25/16  5:29 AM  Result Value Ref Range   Sodium 135 135 - 145 mmol/L   Potassium 4.5 3.5 - 5.1 mmol/L   Chloride 113 (H) 101 - 111 mmol/L   CO2 13 (L) 22 - 32 mmol/L   Glucose, Bld 112 (H) 65 - 99 mg/dL   BUN 67 (H) 6 - 20 mg/dL   Creatinine, Ser 2.46 (H) 0.44 - 1.00 mg/dL   Calcium 9.4 8.9 - 10.3 mg/dL   Total Protein 6.5 6.5 - 8.1 g/dL   Albumin 3.1 (L) 3.5 - 5.0 g/dL   AST 25 15 - 41 U/L   ALT 14 14 - 54 U/L   Alkaline Phosphatase 38 38 - 126 U/L   Total Bilirubin 0.4 0.3 - 1.2 mg/dL   GFR calc non Af Amer 18 (L) >60 mL/min   GFR calc Af Amer 21 (L) >60 mL/min    Comment: (NOTE) The eGFR has been calculated using the CKD EPI equation. This calculation has not been validated in all clinical situations. eGFR's persistently <60 mL/min signify possible Chronic Kidney Disease.    Anion gap 9 5 - 15  Glucose, capillary     Status: None   Collection Time: 09/25/16  7:34 AM  Result Value Ref Range   Glucose-Capillary 92 65 - 99 mg/dL   Comment 1 Notify RN    Comment 2 Document in Chart   Glucose, capillary     Status: Abnormal   Collection Time: 09/25/16  6:49 PM  Result Value Ref Range   Glucose-Capillary 138 (H) 65 - 99 mg/dL   Comment 1 Notify RN   Glucose, capillary     Status: Abnormal  Collection Time: 09/25/16  9:31 PM  Result Value Ref Range   Glucose-Capillary 123 (H) 65 - 99 mg/dL   Comment 1 Notify RN    Comment 2 Document in Chart   Basic metabolic panel      Status: Abnormal   Collection Time: 09/26/16  6:16 AM  Result Value Ref Range   Sodium 138 135 - 145 mmol/L   Potassium 4.0 3.5 - 5.1 mmol/L   Chloride 116 (H) 101 - 111 mmol/L   CO2 16 (L) 22 - 32 mmol/L   Glucose, Bld 104 (H) 65 - 99 mg/dL   BUN 48 (H) 6 - 20 mg/dL   Creatinine, Ser 1.87 (H) 0.44 - 1.00 mg/dL   Calcium 9.2 8.9 - 10.3 mg/dL   GFR calc non Af Amer 25 (L) >60 mL/min   GFR calc Af Amer 29 (L) >60 mL/min    Comment: (NOTE) The eGFR has been calculated using the CKD EPI equation. This calculation has not been validated in all clinical situations. eGFR's persistently <60 mL/min signify possible Chronic Kidney Disease.    Anion gap 6 5 - 15  Glucose, capillary     Status: None   Collection Time: 09/26/16  7:47 AM  Result Value Ref Range   Glucose-Capillary 85 65 - 99 mg/dL    ABGS No results for input(s): PHART, PO2ART, TCO2, HCO3 in the last 72 hours.  Invalid input(s): PCO2 CULTURES No results found for this or any previous visit (from the past 240 hour(s)). Studies/Results: Ct Head Wo Contrast  Result Date: 09/24/2016 CLINICAL DATA:  Patient fell twice.  Confused. EXAM: CT HEAD WITHOUT CONTRAST TECHNIQUE: Contiguous axial images were obtained from the base of the skull through the vertex without intravenous contrast. COMPARISON:  CT from 07/22/2013 FINDINGS: BRAIN: The ventricles and sulci are normal for age. No intraparenchymal hemorrhage, mass effect nor midline shift. Some progression of chronic small vessel ischemic disease since prior exam involving the periventricular and subcortical white matter. No acute large vascular territory infarcts. No abnormal extra-axial fluid collections. Basal cisterns are patent. VASCULAR: Moderate calcific atherosclerosis of the carotid siphons. SKULL: No skull fracture. No significant scalp soft tissue swelling. SINUSES/ORBITS: The mastoid air-cells and included paranasal sinuses are well-aerated.The included ocular globes and  orbital contents are non-suspicious. OTHER: None. IMPRESSION: Chronic small vessel ischemic disease, progressed since 2014. No acute intracranial abnormality identified. Electronically Signed   By: Ashley Royalty M.D.   On: 09/24/2016 23:26    Medications:  Prior to Admission:  Prescriptions Prior to Admission  Medication Sig Dispense Refill Last Dose  . acetaminophen (TYLENOL) 325 MG tablet Take 325-650 mg by mouth every 6 (six) hours as needed for mild pain, moderate pain or headache.    Past Month at Unknown time  . apixaban (ELIQUIS) 5 MG TABS tablet Take 5 mg by mouth 2 (two) times daily.    09/24/2016 at 1830  . atorvastatin (LIPITOR) 40 MG tablet Take 40 mg by mouth at bedtime.   09/23/2016 at Unknown time  . benazepril (LOTENSIN) 40 MG tablet Take 40 mg by mouth daily.    09/24/2016 at Unknown time  . docusate sodium (COLACE) 100 MG capsule Take 100 mg by mouth daily.   09/24/2016 at Unknown time  . ferrous sulfate 325 (65 FE) MG tablet Take 325 mg by mouth daily with breakfast.   09/24/2016 at Unknown time  . ibuprofen (ADVIL,MOTRIN) 200 MG tablet Take 600 mg by mouth every 6 (six) hours as needed  for headache, mild pain or moderate pain.   09/24/2016 at 2030  . isosorbide mononitrate (IMDUR) 30 MG 24 hr tablet Take 1 tablet (30 mg total) by mouth daily. 30 tablet 3 Past Week at Unknown time  . ketorolac (ACULAR) 0.5 % ophthalmic solution Place 1 drop into the right eye 3 (three) times daily.   09/24/2016 at Unknown time  . levETIRAcetam (KEPPRA) 500 MG tablet Take 500 mg by mouth 2 (two) times daily.   09/24/2016 at Unknown time  . lubiprostone (AMITIZA) 24 MCG capsule Take 24 mcg by mouth 2 (two) times daily with a meal.   09/24/2016 at Unknown time  . Melatonin 3 MG TABS Take 3 mg by mouth at bedtime as needed (for sleep).   Past Week at Unknown time  . metFORMIN (GLUCOPHAGE) 500 MG tablet Take 500 mg by mouth 2 (two) times daily with a meal.    09/24/2016 at Unknown time  . metoprolol  tartrate (LOPRESSOR) 25 MG tablet Take 25 mg by mouth 2 (two) times daily.    09/24/2016 at 1830  . nitroGLYCERIN (NITROSTAT) 0.4 MG SL tablet Place 1 tablet (0.4 mg total) under the tongue every 5 (five) minutes as needed for chest pain. 25 tablet 1 Past Month at Unknown time  . omega-3 acid ethyl esters (LOVAZA) 1 G capsule Take 2 g by mouth 2 (two) times daily.    09/24/2016 at Unknown time  . ondansetron (ZOFRAN-ODT) 4 MG disintegrating tablet Take 4 mg by mouth every 8 (eight) hours as needed for nausea or vomiting.   Past Month at Unknown time  . pantoprazole (PROTONIX) 40 MG tablet Take 1 tablet (40 mg total) by mouth daily. 30 tablet 3 09/24/2016 at Unknown time  . potassium chloride (K-DUR,KLOR-CON) 10 MEQ tablet Take 10 mEq by mouth daily.   09/24/2016 at Unknown time  . traZODone (DESYREL) 50 MG tablet Take 50 mg by mouth at bedtime.   09/23/2016 at Unknown time   Scheduled: . apixaban  5 mg Oral BID  . atorvastatin  40 mg Oral QHS  . feeding supplement (ENSURE ENLIVE)  237 mL Oral BID BM  . ferrous sulfate  325 mg Oral Q breakfast  . insulin aspart  0-9 Units Subcutaneous TID WC  . isosorbide mononitrate  30 mg Oral Daily  . ketorolac  1 drop Right Eye TID  . levETIRAcetam  500 mg Oral BID  . lubiprostone  24 mcg Oral BID WC  . omega-3 acid ethyl esters  2 g Oral BID  . pantoprazole  40 mg Oral Daily  . potassium chloride  10 mEq Oral Daily  . traZODone  50 mg Oral QHS   Continuous: . sodium chloride 75 mL/hr at 09/25/16 1956   IDP:OEUMPNTIRWERX, ondansetron **OR** ondansetron (ZOFRAN) IV  Assesment:She was admitted with acute kidney injury. Her creatinine has now come down from about 3 to less than 2. She is still receiving IV fluids and I will continue that today. She had not been eating or drinking well which may have been related to a new medication. She says she feels like she could eat something now. I'm concerned because she's had multiple falls so she is going to have  PT evaluation. She has cardiac disease but is not complaining of any chest pain. She has a history of permanent pacemaker implantation and that seems to be going okay. She has diabetes which is pretty good control of her sugar. She complains of headache but her CT scan  was negative. She has multiple other medical problems as noted but is pretty stable at this point Active Problems:   Gastroparesis   PACEMAKER, PERMANENT   Chronic diastolic heart failure (HCC)   Diabetes mellitus, type II ([place])   Hypertension   Arteriosclerotic cardiovascular disease (ASCVD)   Gastroesophageal reflux disease   Hyperlipidemia   Sleep apnea   Chronic kidney disease (CKD), stage III (moderate)   Seizure disorder (HCC)   Dehydration   Spinal stenosis, lumbar region, with neurogenic claudication   Atrial fibrillation ([place])   AKI (acute kidney injury) ([place])   Acute kidney injury (nontraumatic) ([place])   Fall   Acute kidney injury ([place])    Plan: No change in treatments. Continue medications. PT consultation. Continue IV fluids today. She has medication available for headache. Repeat basic metabolic profile in the morning.nephrology consult if renal function not back to baseline    LOS: 1 day   Kayliegh Boyers L 09/26/2016, 8:14 AM

## 2016-09-26 NOTE — Care Management Important Message (Signed)
Important Message  Patient Details  Name: Dayle PointsSilver P Sainz MRN: [id number] Date of Birth: [birth year]   Medicare Important Message Given:  Yes    Lareina Espino, Chrystine OilerSharley Diane, RN 09/26/2016, 11:59 AM

## 2016-09-26 NOTE — Progress Notes (Signed)
Nutrition Follow-up  DOCUMENTATION CODES:  Obesity Class II    INTERVENTION:  Heart Healthy diet  Ensure Enlive po BID, each supplement provides 350 kcal and 20 grams of protein   Assist with meals as needed   NUTRITION DIAGNOSIS: Inadequate oral intake related to mild confusion vs diminished appetite as evidenced by meal intake and pt was dehydrated on admission     GOAL:  Pt to meet >/= 90% of their estimated nutrition needs      MONITOR: Po intake, labs and wt trends      REASON FOR ASSESSMENT: Malnutrition Screen      ASSESSMENT: Ms [name] presents dehydrated, Acute kidney injury and has fallen at least twice recently. Her weight is stable over the past 18 months between 180-190# except for a couple of outlier wt values. She tells me her appetite has not been very good and expect that her intake has been limited given her compromised hydration status on admission. Her meal intake 25-65% of most meals.  Patient says she lives with her daughter but no family is available at time of RD visit.    Recent Labs Lab 09/24/16 2218 09/25/16 0529 09/26/16 0616  NA 132* 135 138  K 5.0 4.5 4.0  CL 107 113* 116*  CO2 15* 13* 16*  BUN 73* 67* 48*  CREATININE 2.95* 2.46* 1.87*  CALCIUM 10.0 9.4 9.2  GLUCOSE 127* 112* 104*   Labs:BUN-48, Cr 1.87, CL- 16  Meds/nutrition-related supplements: Omega-3, Ferrous sulfate  Nutrition-Focused physical exam completed. Findings are no fat depletion, mild muscle depletion (age related?), and no edema.    Diet Order:  Diet Heart Room service appropriate? Yes; Fluid consistency: Thin  Skin:   no acute concerns  Last BM:   09/2109/21  Height:   Ht Readings from Last 1 Encounters:  09/24/16 5' (1.524 m)    Weight:   Wt Readings from Last 1 Encounters:  09/25/16 187 lb 9.8 oz (85.1 kg)    Ideal Body Weight:   45 kg  BMI:  Body mass index is 36.64 kg/m.  Estimated Nutritional Needs:   Kcal:   1800-1950  Protein:   65-70  gr  Fluid:   1.8 liters daily  EDUCATION NEEDS:  None identified at this time   Royann ShiversLynn Tyshawna Alarid MS,RD,CSG,LDN Office: #[phone] Pager: [phone]

## 2016-09-26 NOTE — Care Management Note (Signed)
Case Management Note  Patient Details  Name: [name] MRN: [id number] Date of Birth: 12-Dec-1940   Expected Discharge Date:  09/28/16               Expected Discharge Plan:  Home w Home Health Services  In-House Referral:  NA  Discharge planning Services  CM Consult  Post Acute Care Choice:  Home Health, Resumption of Svcs/PTA Provider Choice offered to:     DME Arranged:    DME Agency:     HH Arranged:  RN, PT HH Agency:  [facility]  Status of Service:  In process, will continue to follow  If discussed at Long Length of Stay Meetings, dates discussed:    Additional Comments: PT has recommended HH PT. [facility] daughter for PT, OT, aide, CSW. Tora PerchesUlander Barksdale, daughter states that patient lives with her, her granddaughter and SIL. Patient does not have 24/7 care. I talked with Ulander about this being a recommendation and offered a privaty duty list, which she declines as she can not afford to pay OOP for private care. I also mentioned the possibility of patient applying for a CAP aide. Will resume HH services at time of discharge.   Calahan Pak, Chrystine OilerSharley Diane, RN 09/26/2016, 2:56 PM

## 2016-09-26 NOTE — Evaluation (Signed)
Physical Therapy Evaluation Patient Details Name: [name] MRN: [id number] DOB: 1941-04-03 Today's Date: 09/26/2016   History of Present Illness  75 y.o. female, With history of CAD status post CABG, COPD, hypertension, diabetes mellitus who came to the hospital after patient had 2 falls at home. Patient has mild confusion so history is limited and most of history provided by patient's daughter at bedside. Patient fell on Saturday which was unwitnessed but patient denies loss of consciousness. And today again patient fell later this afternoon.  DX: AKI, and fall with dehydration.   Clinical Impression  Pt received in sitting up in the chair, and is agreeable to PT evaluation.  Pt expressed that she lives with a granddaugther, dtr, and son in law, however during the day, she is at home alone.  Pt states she uses her RW when she is outside the house or going for long distances.  During tx today she required Min guard for sit<>stand transfer and Min guard for ambulation x 2160ft with RW.  Pt demonstrates decreased gait speed.  She is at high risk for falling due to history of falls, decreased cognition and decreased gait speed.  At this point, she is recommended to d/c home with 24/7 supervision assistance, and HHPT.  She is also recommended to use her RW inside the house.      Follow Up Recommendations Home health PT;Supervision/Assistance - 24 hour    Equipment Recommendations  None recommended by PT    Recommendations for Other Services       Precautions / Restrictions Precautions Precautions: Fall Precaution Comments: 2 falls last week - states that she slipped and fell on the kitchen floor.   Restrictions Weight Bearing Restrictions: No      Mobility  Bed Mobility Overal bed mobility:  (Not observed - pt already sitting up in the chair )                Transfers Overall transfer level: Needs assistance Equipment used: Rolling walker (2 wheeled) Transfers: Sit to/from  Stand Sit to Stand: Min guard            Ambulation/Gait Ambulation/Gait assistance: Min guard;Supervision Ambulation Distance (Feet): 60 Feet Assistive device: Rolling walker (2 wheeled) Gait Pattern/deviations: Wide base of support;Antalgic   Gait velocity interpretation: <1.8 ft/sec, indicative of risk for recurrent falls General Gait Details: decreased gait speed  Stairs            Wheelchair Mobility    Modified Rankin (Stroke Patients Only)       Balance Overall balance assessment: Needs assistance Sitting-balance support: Feet supported;No upper extremity supported Sitting balance-Leahy Scale: Good     Standing balance support: Bilateral upper extremity supported Standing balance-Leahy Scale: Fair                               Pertinent Vitals/Pain Pain Assessment: 0-10 Pain Score: 10-Worst pain ever Pain Location: HA - states she has had it for 1 week.   Pain Descriptors / Indicators: Constant;Aching Pain Intervention(s): Limited activity within patient's tolerance;Repositioned;Monitored during session    Home Living   Living Arrangements: Children (dtr and son in law, and granddaughter. ) Available Help at Discharge: Family (son in law takes care of the work) Type of Home: House Home Access: Level entry     Home Layout: One level Home Equipment: Environmental consultantWalker - 2 wheels;Tub bench;Bedside commode;Wheelchair - manual      Prior  Function     Gait / Transfers Assistance Needed: pt uses her RW when she ambulates outside or longer distances.    ADL's / Homemaking Assistance Needed: independent with dressing, and bathing - sponge baths.  Dtr does the groceries, run errands, and cooking.          Hand Dominance   Dominant Hand: Right    Extremity/Trunk Assessment   Upper Extremity Assessment: Overall WFL for tasks assessed           Lower Extremity Assessment: Generalized weakness         Communication   Communication: No  difficulties  Cognition Arousal/Alertness: (P) Awake/alert Behavior During Therapy: (P) WFL for tasks assessed/performed Overall Cognitive Status: (P) History of cognitive impairments - at baseline Area of Impairment: (P) Orientation Orientation Level: (P) Disoriented to;Place;Time ("02-Feb-1941")                  General Comments      Exercises     Assessment/Plan    PT Assessment Patient needs continued PT services  PT Problem List Decreased strength;Decreased activity tolerance;Decreased balance;Decreased mobility;Decreased safety awareness;Decreased knowledge of precautions;Obesity          PT Treatment Interventions DME instruction;Gait training;Functional mobility training;Therapeutic activities;Therapeutic exercise;Balance training;Patient/family education    PT Goals (Current goals can be found in the Care Plan section)  Acute Rehab PT Goals Patient Stated Goal: Pt wants to go home PT Goal Formulation: With patient Time For Goal Achievement: 10/03/16 Potential to Achieve Goals: Good    Frequency Min 2X/week   Barriers to discharge Decreased caregiver support Pt states she is at home alone during the day.     Co-evaluation               End of Session Equipment Utilized During Treatment: Gait belt Activity Tolerance: Patient tolerated treatment well Patient left: in chair;with call bell/phone within reach;with nursing/sitter in room      Functional Assessment Tool Used: [employer] AM-PAC "6-clicks"  Functional Limitation: Mobility: Walking and moving around Mobility: Walking and Moving Around Current Status [phone]): At least 20 percent but less than 40 percent impaired, limited or restricted Mobility: Walking and Moving Around Goal Status [phone]): At least 1 percent but less than 20 percent impaired, limited or restricted    Time: 1419-1445 PT Time Calculation (min) (ACUTE ONLY): 26 min   Charges:   PT Evaluation $PT Eval Low  Complexity: 1 Procedure PT Treatments $Gait Training: 8-22 mins   PT G Codes:   PT G-Codes **NOT FOR INPATIENT CLASS** Functional Assessment Tool Used: [employer] "6-clicks"  Functional Limitation: Mobility: Walking and moving around Mobility: Walking and Moving Around Current Status [phone]): At least 20 percent but less than 40 percent impaired, limited or restricted Mobility: Walking and Moving Around Goal Status [phone]): At least 1 percent but less than 20 percent impaired, limited or restricted    Beth Sharanda Shinault, PT, DPT X: [phone]

## 2016-09-26 NOTE — Care Management Note (Signed)
Case Management Note  Patient Details  Name: [name] MRN: [id number] Date of Birth: 10-11-1941  Subjective/Objective:     Patient adm from home with AKI. Her granddaughter lives with her, she is ind with ADL's. She has a cane and walker at home. [facility].    She has a PCP, still drives to appointments, and reports no issues with affording medications.   Action/Plan: PT eval pending. At minimal will resume home health services. Will follow.  Expected Discharge Date:  09/28/16               Expected Discharge Plan:  Home w Home Health Services  In-House Referral:  NA  Discharge planning Services  CM Consult  Post Acute Care Choice:  Home Health, Resumption of Svcs/PTA Provider Choice offered to:     DME Arranged:    DME Agency:     HH Arranged:  RN, PT HH Agency:  [facility]  Status of Service:  In process, will continue to follow  If discussed at Long Length of Stay Meetings, dates discussed:    Additional Comments:  Kalayla Shadden, Chrystine OilerSharley Diane, RN 09/26/2016, 11:52 AM

## 2016-09-27 ENCOUNTER — Other Ambulatory Visit: Payer: Self-pay | Admitting: *Deleted

## 2016-09-27 LAB — CBC
HCT: 26.8 % — ABNORMAL LOW (ref 36.0–46.0)
HEMOGLOBIN: 8.7 g/dL — AB (ref 12.0–15.0)
MCH: 25.9 pg — ABNORMAL LOW (ref 26.0–34.0)
MCHC: 32.5 g/dL (ref 30.0–36.0)
MCV: 79.8 fL (ref 78.0–100.0)
PLATELETS: 141 10*3/uL — AB (ref 150–400)
RBC: 3.36 MIL/uL — AB (ref 3.87–5.11)
RDW: 17.7 % — ABNORMAL HIGH (ref 11.5–15.5)
WBC: 8.7 10*3/uL (ref 4.0–10.5)

## 2016-09-27 LAB — GLUCOSE, CAPILLARY
GLUCOSE-CAPILLARY: 103 mg/dL — AB (ref 65–99)
Glucose-Capillary: 118 mg/dL — ABNORMAL HIGH (ref 65–99)
Glucose-Capillary: 106 mg/dL — ABNORMAL HIGH (ref 65–99)
Glucose-Capillary: 120 mg/dL — ABNORMAL HIGH (ref 65–99)

## 2016-09-27 LAB — BASIC METABOLIC PANEL
BUN: 32 mg/dL — ABNORMAL HIGH (ref 6–20)
CALCIUM: 8.5 mg/dL — AB (ref 8.9–10.3)
CO2: 18 mmol/L — ABNORMAL LOW (ref 22–32)
CREATININE: 1.54 mg/dL — AB (ref 0.44–1.00)
Chloride: 118 mmol/L — ABNORMAL HIGH (ref 101–111)
GFR, EST AFRICAN AMERICAN: 36 mL/min — AB (ref >60)
GFR, EST NON AFRICAN AMERICAN: 31 mL/min — AB (ref >60)
GLUCOSE: 102 mg/dL — AB (ref 65–99)
Potassium: 4.1 mmol/L (ref 3.5–5.1)
SODIUM: 138 mmol/L (ref 135–145)

## 2016-09-27 IMAGING — DX DG ANKLE COMPLETE 3+V*L*
3 series · 3 of 3 positions shown · non-contrast
Comparison: None.

CLINICAL DATA: Left foot and ankle pain for 2 weeks.

EXAM:
LEFT ANKLE COMPLETE - 3+ VIEW

[ankle ap]
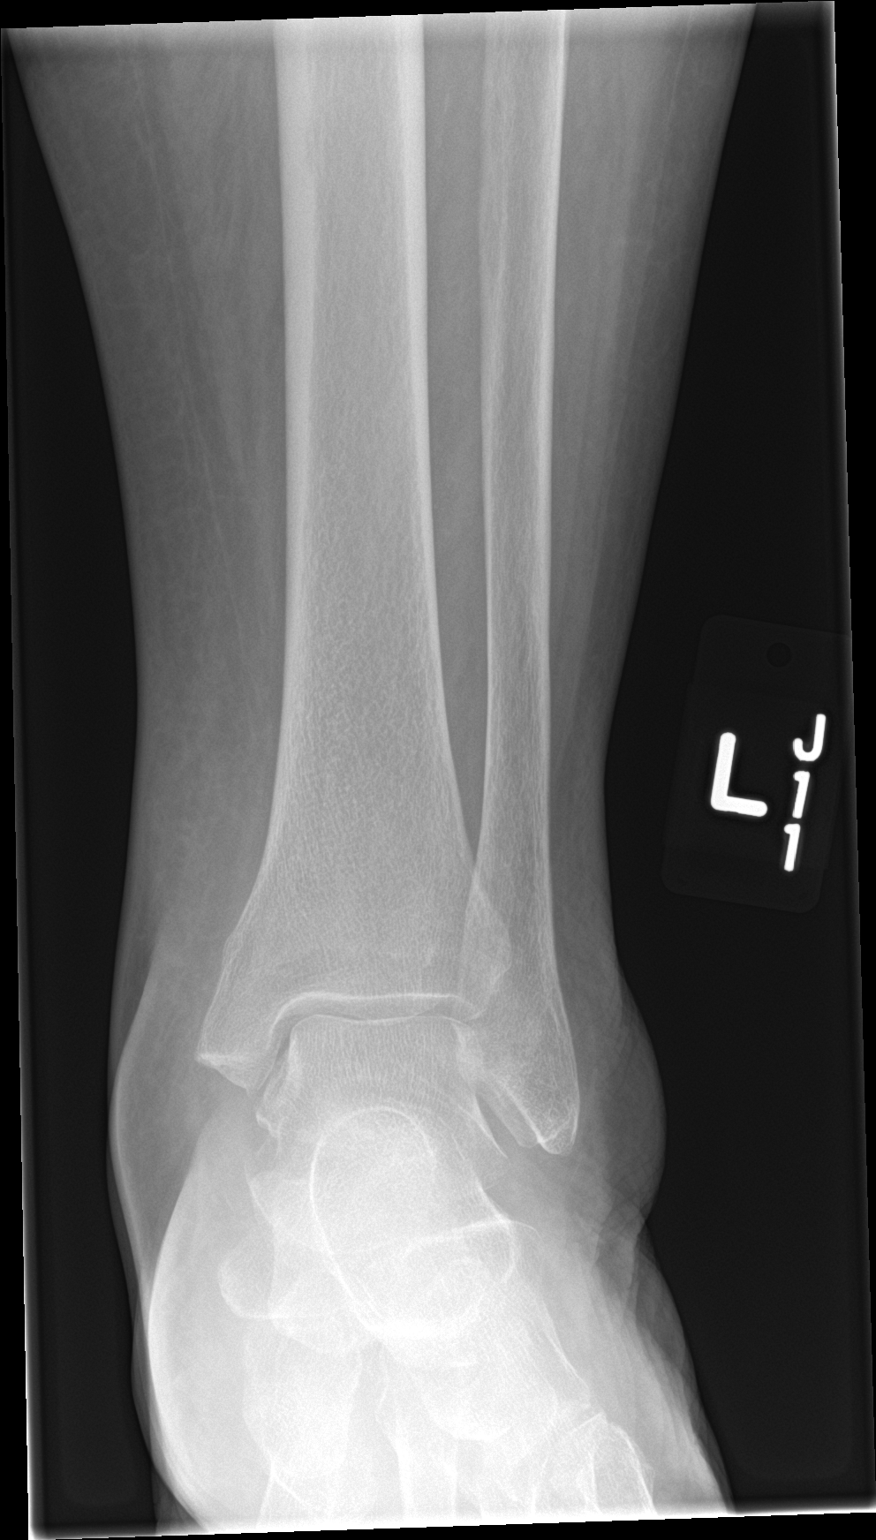

[ankle obl]
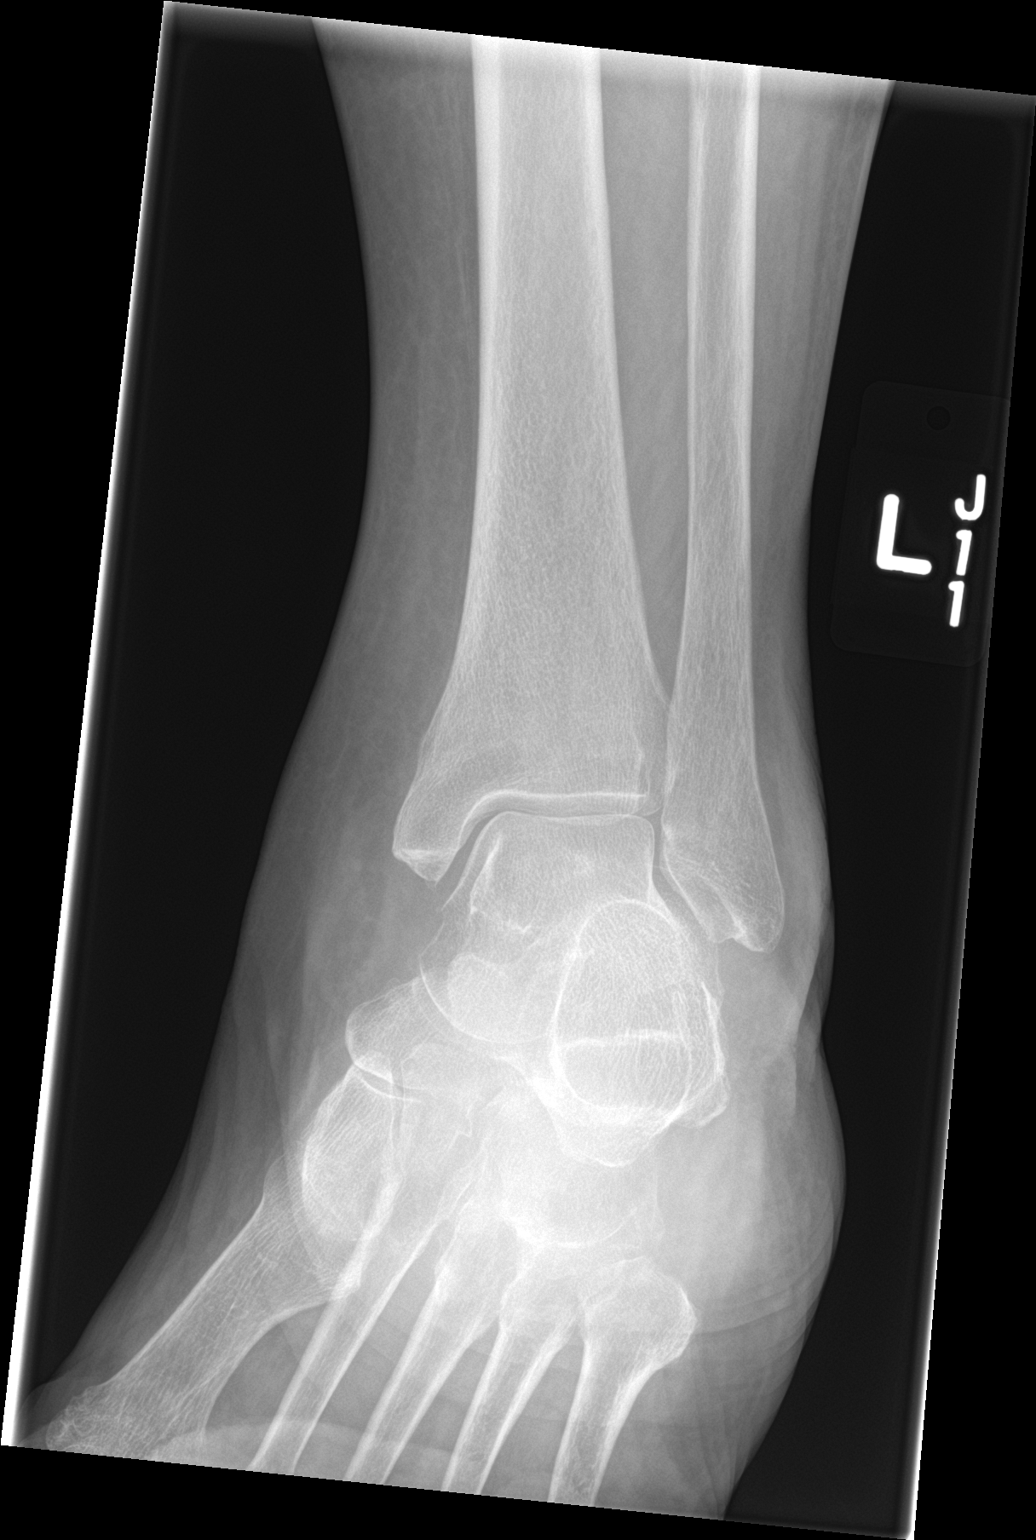

[ankle lat]
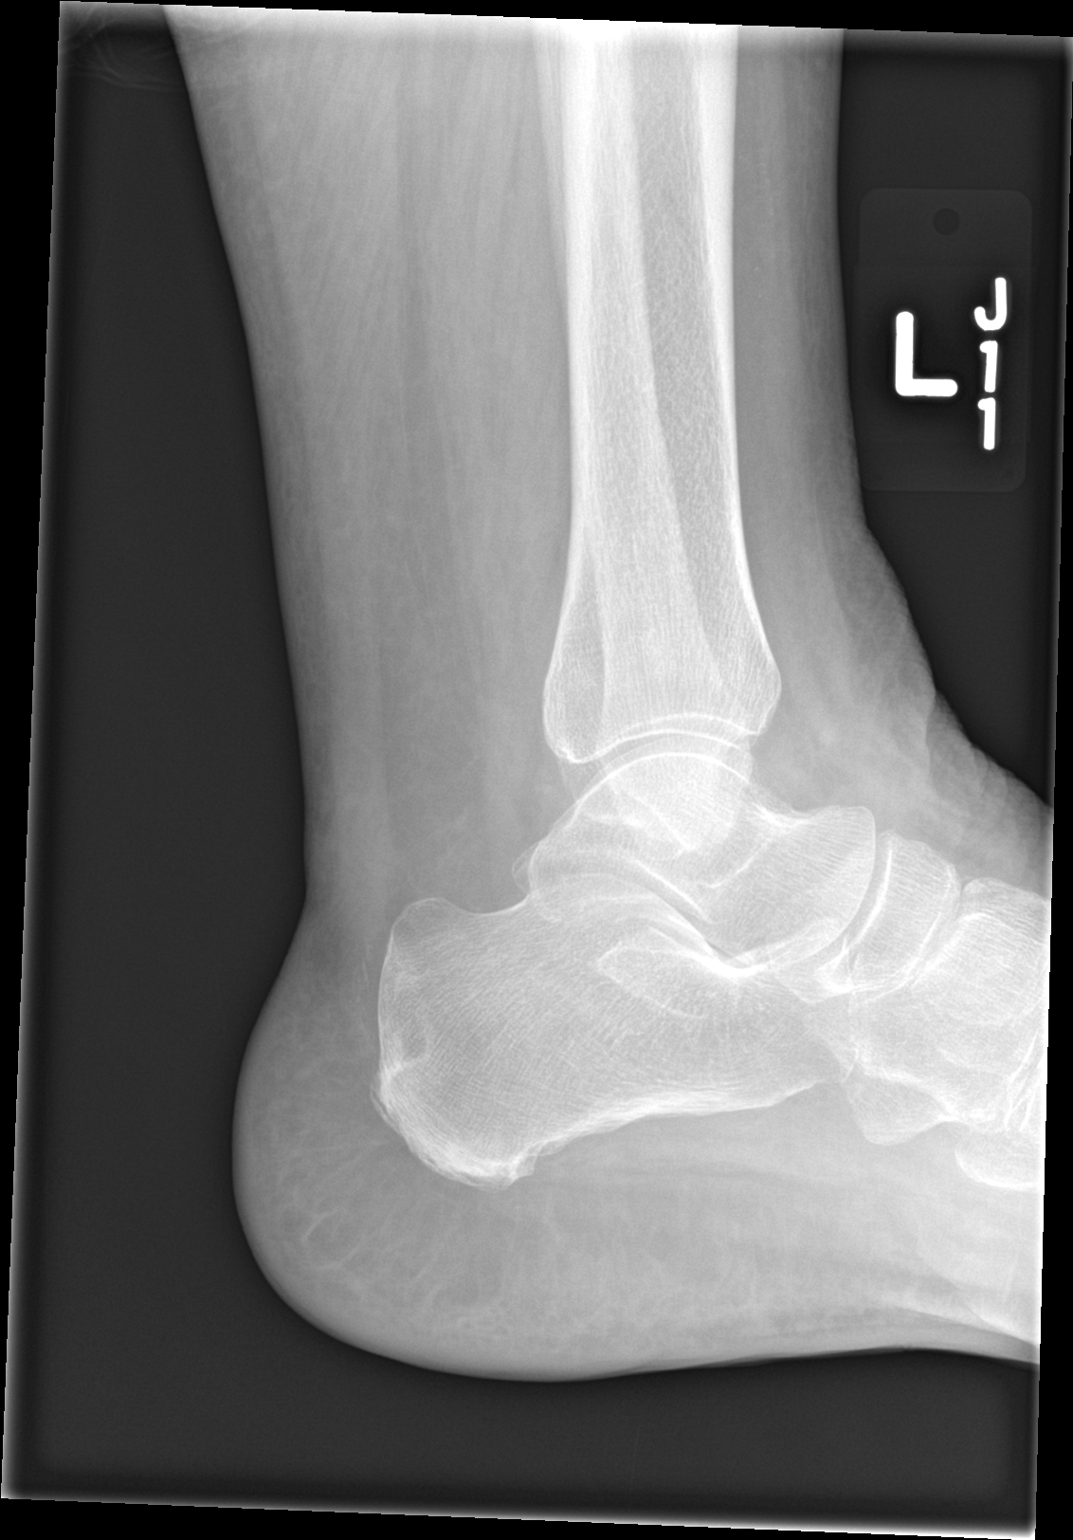

[3 of 3 positions shown; findings below may reference images not displayed]

FINDINGS: Plafond and talar dome intact. No malleolar abnormality. Small
Achilles and plantar calcaneal spurs. Subtalar joints appear intact.
IMPRESSION: 1. No acute bony findings.
2. Small plantar and Achilles calcaneal spurs.

## 2016-09-27 IMAGING — DX DG FOOT COMPLETE 3+V*L*
3 series · 3 of 3 positions shown · non-contrast
Comparison: 12/24/2014

CLINICAL DATA: Left foot pain.  No known injury.

EXAM:
LEFT FOOT - COMPLETE 3+ VIEW

[foot ap]
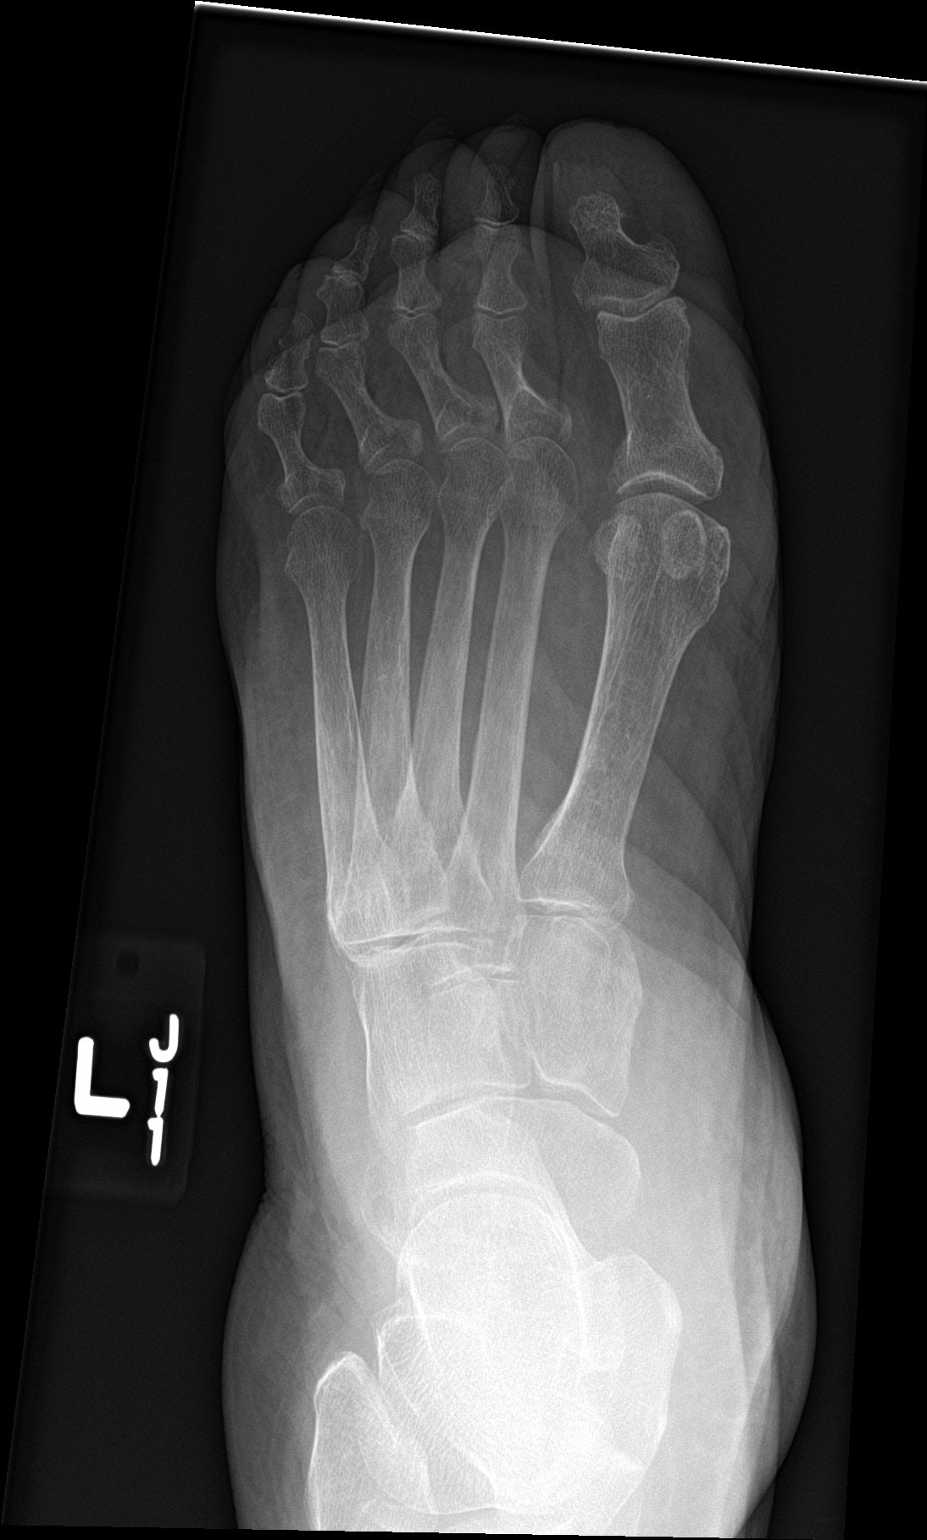

[foot obl]
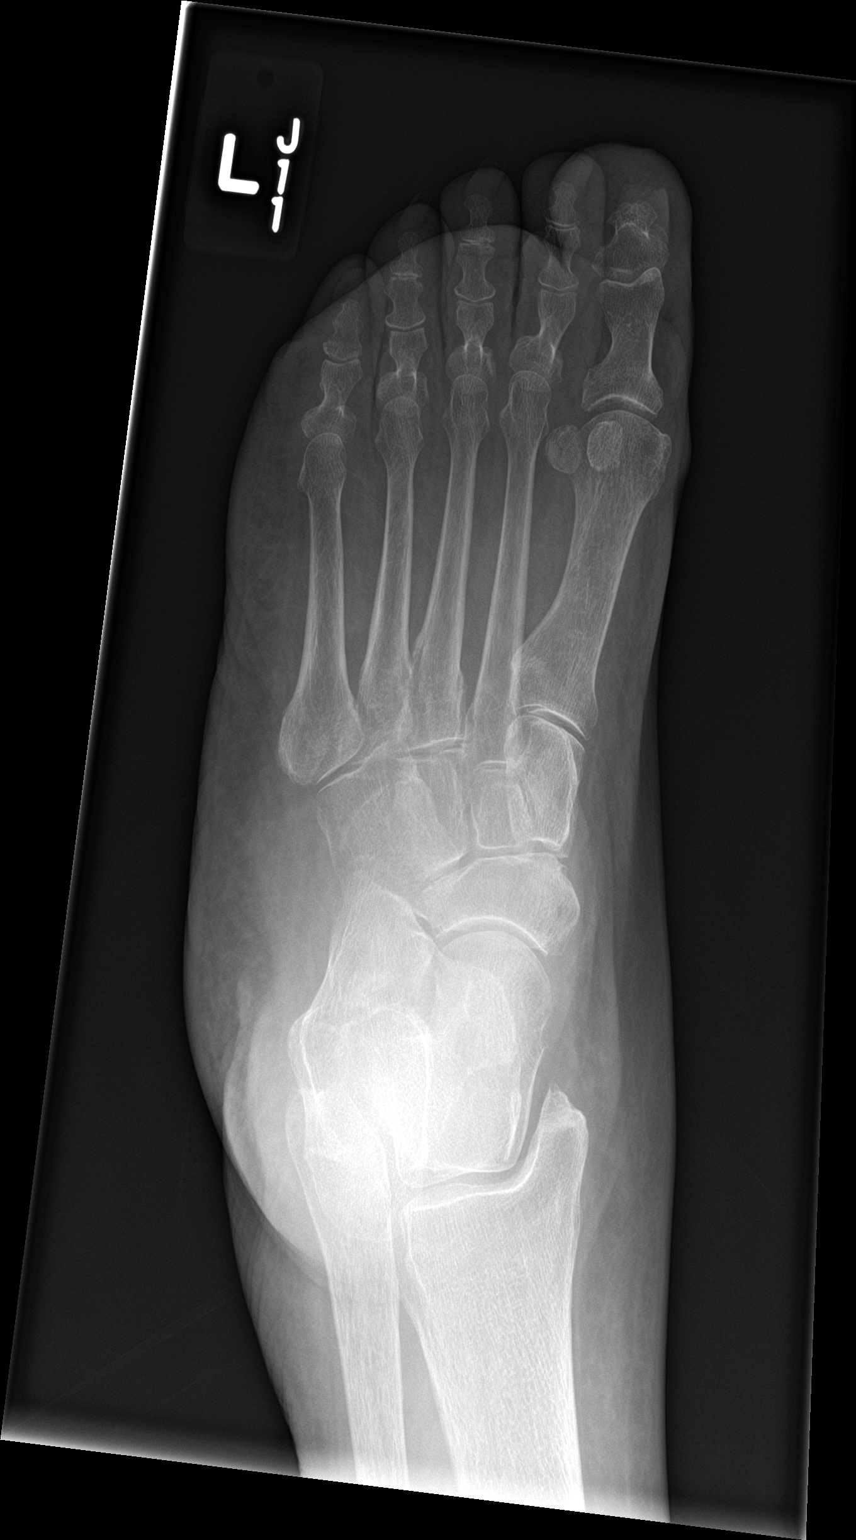

[foot lat]
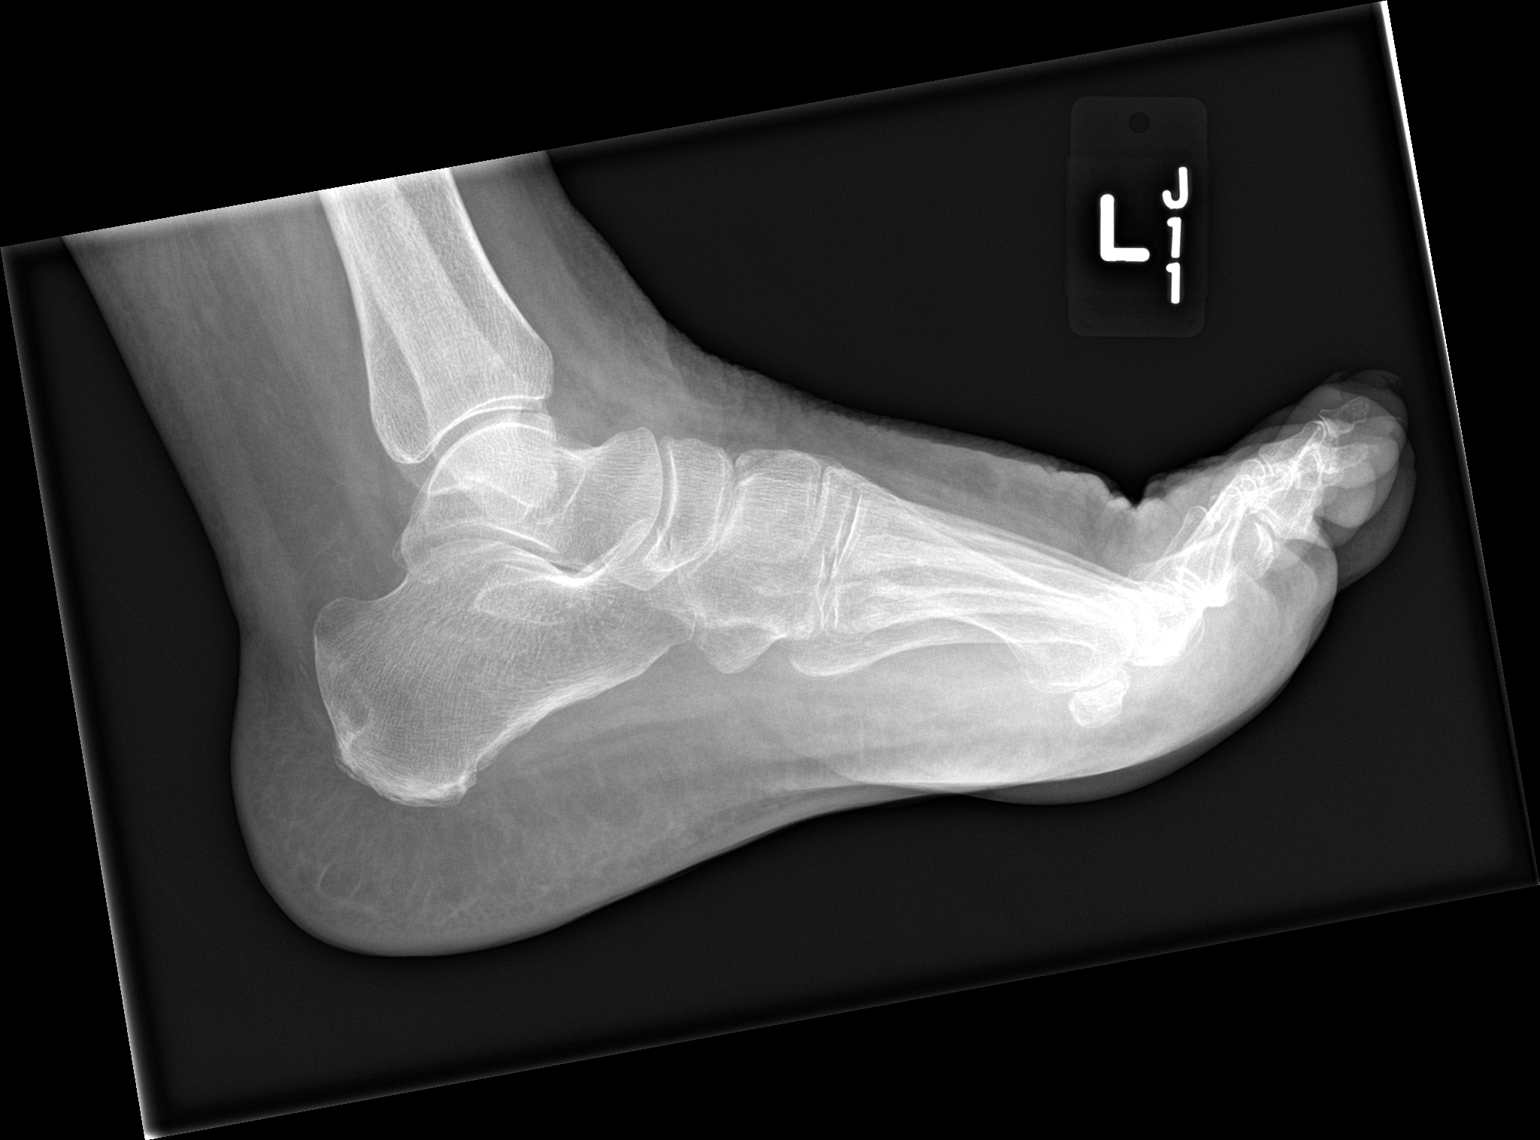

[3 of 3 positions shown; findings below may reference images not displayed]

FINDINGS: There is no evidence of fracture or dislocation. Mild hallux valgus
and bony bunion formation again noted. Generalized osteopenia again
demonstrated.
IMPRESSION: No acute findings.

Mild hallux valgus and bony bunion.  Osteopenia.

## 2016-09-27 MED ORDER — ACETAMINOPHEN 325 MG PO TABS
325.0000 mg | ORAL_TABLET | ORAL | Status: DC | PRN
  Administered 2016-09-27 – 2016-09-28 (×2): 650 mg via ORAL
  Filled 2016-09-27 (×2): qty 2

## 2016-09-27 NOTE — Progress Notes (Signed)
Physical Therapy Treatment Patient Details Name: [name] MRN: [id number] DOB: 02-11-1941 Today's Date: 09/27/2016    History of Present Illness 75 y.o. female, With history of CAD status post CABG, COPD, hypertension, diabetes mellitus who came to the hospital after patient had 2 falls at home. Patient has mild confusion so history is limited and most of history provided by patient's daughter at bedside. Patient fell on Saturday which was unwitnessed but patient denies loss of consciousness. And today again patient fell later this afternoon.  DX: AKI, and fall with dehydration.     PT Comments    Pt sitting in chair upon arrival and agreeable to participate with therapist today.  No reports of pain though did mention through session that had headache earlier that has reduced and c/o Bil UE tired near EOS.  Min guard with sit to stand (min cueing for hand placement to assist) and increased gait distance with RW to 85 feet. EOS pt limited by fatigue, no reports of pain.  Pt left in chair with call bell within reach and chair alarm set.   Follow Up Recommendations        Equipment Recommendations       Recommendations for Other Services       Precautions / Restrictions Precautions Precautions: Fall Precaution Comments: 2 falls last week - states that she slipped and fell on the kitchen floor.   Restrictions Weight Bearing Restrictions: No    Mobility  Bed Mobility Overal bed mobility:  (Pt sitting in chair upon arrival)                Transfers Overall transfer level: Modified independent Equipment used: Rolling walker (2 wheeled) Transfers: Sit to/from Stand Sit to Stand: Min guard            Ambulation/Gait Ambulation/Gait assistance: Min guard;Supervision Ambulation Distance (Feet): 85 Feet Assistive device: Rolling walker (2 wheeled) Gait Pattern/deviations: Wide base of support;Trunk flexed     General Gait Details: decreased gait  speed   Stairs            Wheelchair Mobility    Modified Rankin (Stroke Patients Only)       Balance                                    Cognition Arousal/Alertness: Awake/alert Behavior During Therapy: WFL for tasks assessed/performed Overall Cognitive Status: Within Functional Limits for tasks assessed                      Exercises      General Comments        Pertinent Vitals/Pain Pain Score: 0-No pain    Home Living                      Prior Function            PT Goals (current goals can now be found in the care plan section) Progress towards PT goals: Progressing toward goals    Frequency           PT Plan Current plan remains appropriate    Co-evaluation             End of Session Equipment Utilized During Treatment: Gait belt Activity Tolerance: Patient tolerated treatment well Patient left: in chair;with call bell/phone within reach;with chair alarm set     Time: 1710-1732 PT  Time Calculation (min) (ACUTE ONLY): 22 min  Charges:  $Gait Training: 8-22 mins $Therapeutic Activity: 8-22 mins                    G Codes:     Becky SaxCasey Allan Bacigalupi, LPTA; CBIS [phone]  Juel BurrowCockerham, Miliano Cotten Jo 09/27/2016, 5:38 PM

## 2016-09-27 NOTE — Patient Outreach (Addendum)
[facility] Care Management  09/27/2016  [name] [birth year] [id number]  [facility] of [name] hospitalization. I last spoke with [name] on 10/17 and followed up with Dr. Juanetta GoslingHawkins office re: home care service orders (HHPT).  Plan: I will follow her progress closely and reach out to her upon discharge for transition of care and care coordination services.    [name] MHA,BSN,RN,CCM [facility]  [phone]

## 2016-09-27 NOTE — Progress Notes (Signed)
Subjective: She says she feels better. Physical therapy has seen her and recommended home health physical therapy. She's not having any chest pain. She still has a headache. Her nausea is better. She has better appetite. She doesn't feel as weak.  Objective: Vital signs in last 24 hours: Temp:  [98 F (36.7 C)-98.5 F (36.9 C)] 98 F (36.7 C) (10/24 0645) Pulse Rate:  [65-83] 65 (10/24 0645) Resp:  [15-18] 15 (10/24 0645) BP: (114-122)/(59-75) 114/75 (10/24 0645) SpO2:  [99 %-100 %] 99 % (10/24 0645) Weight change:  Last BM Date: 09/24/16  Intake/Output from previous day: 10/23 0701 - 10/24 0700 In: 480 [P.O.:480] Out: 200 [Urine:200]  PHYSICAL EXAM General appearance: alert, cooperative and mild distress Resp: clear to auscultation bilaterally Cardio: regular rate and rhythm, S1, S2 normal, no murmur, click, rub or gallop GI: soft, non-tender; bowel sounds normal; no masses,  no organomegaly Extremities: extremities normal, atraumatic, no cyanosis or edema Skin is warm and dry mucous membranes are moist pupils are reactive  Lab Results:  Results for orders placed or performed during the hospital encounter of 09/24/16 (from the past 48 hour(s))  Glucose, capillary     Status: Abnormal   Collection Time: 09/25/16  6:49 PM  Result Value Ref Range   Glucose-Capillary 138 (H) 65 - 99 mg/dL   Comment 1 Notify RN   Glucose, capillary     Status: Abnormal   Collection Time: 09/25/16  9:31 PM  Result Value Ref Range   Glucose-Capillary 123 (H) 65 - 99 mg/dL   Comment 1 Notify RN    Comment 2 Document in Chart   Basic metabolic panel     Status: Abnormal   Collection Time: 09/26/16  6:16 AM  Result Value Ref Range   Sodium 138 135 - 145 mmol/L   Potassium 4.0 3.5 - 5.1 mmol/L   Chloride 116 (H) 101 - 111 mmol/L   CO2 16 (L) 22 - 32 mmol/L   Glucose, Bld 104 (H) 65 - 99 mg/dL   BUN 48 (H) 6 - 20 mg/dL   Creatinine, Ser 1.87 (H) 0.44 - 1.00 mg/dL   Calcium 9.2 8.9 - 10.3  mg/dL   GFR calc non Af Amer 25 (L) >60 mL/min   GFR calc Af Amer 29 (L) >60 mL/min    Comment: (NOTE) The eGFR has been calculated using the CKD EPI equation. This calculation has not been validated in all clinical situations. eGFR's persistently <60 mL/min signify possible Chronic Kidney Disease.    Anion gap 6 5 - 15  Glucose, capillary     Status: None   Collection Time: 09/26/16  7:47 AM  Result Value Ref Range   Glucose-Capillary 85 65 - 99 mg/dL  Glucose, capillary     Status: Abnormal   Collection Time: 09/26/16 11:11 AM  Result Value Ref Range   Glucose-Capillary 158 (H) 65 - 99 mg/dL  Glucose, capillary     Status: Abnormal   Collection Time: 09/26/16  4:56 PM  Result Value Ref Range   Glucose-Capillary 105 (H) 65 - 99 mg/dL  Glucose, capillary     Status: Abnormal   Collection Time: 09/26/16  9:22 PM  Result Value Ref Range   Glucose-Capillary 111 (H) 65 - 99 mg/dL   Comment 1 Notify RN    Comment 2 Document in Chart   CBC     Status: Abnormal   Collection Time: 09/27/16  7:47 AM  Result Value Ref Range   WBC 8.7  4.0 - 10.5 K/uL   RBC 3.36 (L) 3.87 - 5.11 MIL/uL   Hemoglobin 8.7 (L) 12.0 - 15.0 g/dL   HCT 26.8 (L) 36.0 - 46.0 %   MCV 79.8 78.0 - 100.0 fL   MCH 25.9 (L) 26.0 - 34.0 pg   MCHC 32.5 30.0 - 36.0 g/dL   RDW 17.7 (H) 11.5 - 15.5 %   Platelets 141 (L) 150 - 400 K/uL  Glucose, capillary     Status: Abnormal   Collection Time: 09/27/16  7:49 AM  Result Value Ref Range   Glucose-Capillary 103 (H) 65 - 99 mg/dL   Comment 1 Notify RN    Comment 2 Document in Chart     ABGS No results for input(s): PHART, PO2ART, TCO2, HCO3 in the last 72 hours.  Invalid input(s): PCO2 CULTURES No results found for this or any previous visit (from the past 240 hour(s)). Studies/Results: No results found.  Medications:  Prior to Admission:  Prescriptions Prior to Admission  Medication Sig Dispense Refill Last Dose  . acetaminophen (TYLENOL) 325 MG tablet Take  325-650 mg by mouth every 6 (six) hours as needed for mild pain, moderate pain or headache.    Past Month at Unknown time  . apixaban (ELIQUIS) 5 MG TABS tablet Take 5 mg by mouth 2 (two) times daily.    09/24/2016 at 1830  . atorvastatin (LIPITOR) 40 MG tablet Take 40 mg by mouth at bedtime.   09/23/2016 at Unknown time  . benazepril (LOTENSIN) 40 MG tablet Take 40 mg by mouth daily.    09/24/2016 at Unknown time  . docusate sodium (COLACE) 100 MG capsule Take 100 mg by mouth daily.   09/24/2016 at Unknown time  . ferrous sulfate 325 (65 FE) MG tablet Take 325 mg by mouth daily with breakfast.   09/24/2016 at Unknown time  . ibuprofen (ADVIL,MOTRIN) 200 MG tablet Take 600 mg by mouth every 6 (six) hours as needed for headache, mild pain or moderate pain.   09/24/2016 at 2030  . isosorbide mononitrate (IMDUR) 30 MG 24 hr tablet Take 1 tablet (30 mg total) by mouth daily. 30 tablet 3 Past Week at Unknown time  . ketorolac (ACULAR) 0.5 % ophthalmic solution Place 1 drop into the right eye 3 (three) times daily.   09/24/2016 at Unknown time  . levETIRAcetam (KEPPRA) 500 MG tablet Take 500 mg by mouth 2 (two) times daily.   09/24/2016 at Unknown time  . lubiprostone (AMITIZA) 24 MCG capsule Take 24 mcg by mouth 2 (two) times daily with a meal.   09/24/2016 at Unknown time  . Melatonin 3 MG TABS Take 3 mg by mouth at bedtime as needed (for sleep).   Past Week at Unknown time  . metFORMIN (GLUCOPHAGE) 500 MG tablet Take 500 mg by mouth 2 (two) times daily with a meal.    09/24/2016 at Unknown time  . metoprolol tartrate (LOPRESSOR) 25 MG tablet Take 25 mg by mouth 2 (two) times daily.    09/24/2016 at 1830  . nitroGLYCERIN (NITROSTAT) 0.4 MG SL tablet Place 1 tablet (0.4 mg total) under the tongue every 5 (five) minutes as needed for chest pain. 25 tablet 1 Past Month at Unknown time  . omega-3 acid ethyl esters (LOVAZA) 1 G capsule Take 2 g by mouth 2 (two) times daily.    09/24/2016 at Unknown time  .  ondansetron (ZOFRAN-ODT) 4 MG disintegrating tablet Take 4 mg by mouth every 8 (eight) hours as needed  for nausea or vomiting.   Past Month at Unknown time  . pantoprazole (PROTONIX) 40 MG tablet Take 1 tablet (40 mg total) by mouth daily. 30 tablet 3 09/24/2016 at Unknown time  . potassium chloride (K-DUR,KLOR-CON) 10 MEQ tablet Take 10 mEq by mouth daily.   09/24/2016 at Unknown time  . traZODone (DESYREL) 50 MG tablet Take 50 mg by mouth at bedtime.   09/23/2016 at Unknown time   Scheduled: . apixaban  5 mg Oral BID  . atorvastatin  40 mg Oral QHS  . feeding supplement (ENSURE ENLIVE)  237 mL Oral BID BM  . ferrous sulfate  325 mg Oral Q breakfast  . insulin aspart  0-9 Units Subcutaneous TID WC  . isosorbide mononitrate  30 mg Oral Daily  . ketorolac  1 drop Right Eye TID  . levETIRAcetam  500 mg Oral BID  . lubiprostone  24 mcg Oral BID WC  . omega-3 acid ethyl esters  2 g Oral BID  . pantoprazole  40 mg Oral Daily  . potassium chloride  10 mEq Oral Daily  . traZODone  50 mg Oral QHS   Continuous: . sodium chloride 75 mL/hr at 09/26/16 2226   ZMO:[id number], ondansetron **OR** ondansetron (ZOFRAN) IV  Assesment:She was admitted with dehydration and nausea and vomiting and acute on chronic kidney disease. She is improving. She is weak but that's better. She has multiple other medical problems at baseline including coronary artery disease she also has chronic diastolic heart failure. Her chronic kidney disease is stage III at baseline. She has spinal stenosis with neurogenic claudication. She has chronic atrial fibrillation. She has hypertension at baseline. She has sleep apnea on CPAP doing okay and she has seizure disorder but has not had any seizures lately Active Problems:   Gastroparesis   PACEMAKER, PERMANENT   Chronic diastolic heart failure (HCC)   Diabetes mellitus, type II (HCC)   Hypertension   Arteriosclerotic cardiovascular disease (ASCVD)   Gastroesophageal reflux  disease   Hyperlipidemia   Sleep apnea   Chronic kidney disease (CKD), stage III (moderate)   Seizure disorder (HCC)   Dehydration   Spinal stenosis, lumbar region, with neurogenic claudication   Atrial fibrillation ([place])   AKI (acute kidney injury) ([place])   Acute kidney injury (nontraumatic) ([place])   Fall   Acute kidney injury ([place])    Plan: Reduce IV fluids probable discharge tomorrow with home health services    LOS: 2 days   , L 09/27/2016, 8:34 AM

## 2016-09-27 NOTE — Consult Note (Signed)
   [facility]   09/27/2016  [name] [birth year] [id number]    [facility] management services.  Patient has been engaged by a [facility] Community Care Coordinator and LCSW.  Our community based plan of care has focused on disease management and community resource support.  Patient will receive a post discharge transition of care call and will be evaluated for monthly home visits for assessments and disease process education.  Made Inpatient Case Manager aware that [facility] following. Of note, [facility] services does not replace or interfere with any services that are arranged by inpatient case management or social work.   For additional questions or referrals please contact:   Alben SpittleMary E. Albertha GheeNiemczura, RN, BSN, [facility]   Hospital Liaison [facility] [phone]) Business Cell  [phone]) Toll Free Office

## 2016-09-28 DIAGNOSIS — Z23 Encounter for immunization: Secondary | ICD-10-CM | POA: Diagnosis not present

## 2016-09-28 DIAGNOSIS — S060X0A Concussion without loss of consciousness, initial encounter: Secondary | ICD-10-CM | POA: Diagnosis present

## 2016-09-28 LAB — GLUCOSE, CAPILLARY
Glucose-Capillary: 102 mg/dL — ABNORMAL HIGH (ref 65–99)
Glucose-Capillary: 133 mg/dL — ABNORMAL HIGH (ref 65–99)

## 2016-09-28 MED ORDER — HEPARIN SOD (PORK) LOCK FLUSH 100 UNIT/ML IV SOLN
500.0000 [IU] | INTRAVENOUS | Status: AC | PRN
  Administered 2016-09-28: 500 [IU]
  Filled 2016-09-28: qty 5

## 2016-09-28 NOTE — Progress Notes (Signed)
Pt up to sink washing herself up with stand by assistance from nurse. Pt alert and oriented to person, place and time. Denies any pain or discomfort at this time. Insisted on sitting up in chair, chair alarm on. Will continue to monitor.

## 2016-09-28 NOTE — Discharge Summary (Signed)
Subjective: She feels better. She has no complaints except headache. I think she probably struck her head when she fell and this is likely from concussion. She's ready for discharge. She has no other new complaints. She is eating well. No chest pain. No nausea or vomiting.  Objective: Vital signs in last 24 hours: Temp:  [98.5 F (36.9 C)-98.7 F (37.1 C)] 98.5 F (36.9 C) (10/25 0655) Pulse Rate:  [57-100] 98 (10/25 0655) Resp:  [16-20] 17 (10/25 0655) BP: (123-138)/(84-92) 128/85 (10/25 0655) SpO2:  [99 %-100 %] 99 % (10/24 2138) Weight change:  Last BM Date: 09/24/16  Intake/Output from previous day: 10/24 0701 - 10/25 0700 In: 1291 [P.O.:960; I.V.:331] Out: 200 [Urine:200]  PHYSICAL EXAM General appearance: alert, cooperative and mild distress Resp: clear to auscultation bilaterally Cardio: She has well-controlled atrial fib GI: soft, non-tender; bowel sounds normal; no masses,  no organomegaly Extremities: extremities normal, atraumatic, no cyanosis or edema Her pupils are equal round and reactive to light. Mucous membranes are moist. Skin is warm and dry  Lab Results:  Results for orders placed or performed during the hospital encounter of 09/24/16 (from the past 48 hour(s))  Glucose, capillary     Status: Abnormal   Collection Time: 09/26/16 11:11 AM  Result Value Ref Range   Glucose-Capillary 158 (H) 65 - 99 mg/dL  Glucose, capillary     Status: Abnormal   Collection Time: 09/26/16  4:56 PM  Result Value Ref Range   Glucose-Capillary 105 (H) 65 - 99 mg/dL  Glucose, capillary     Status: Abnormal   Collection Time: 09/26/16  9:22 PM  Result Value Ref Range   Glucose-Capillary 111 (H) 65 - 99 mg/dL   Comment 1 Notify RN    Comment 2 Document in Chart   Basic metabolic panel     Status: Abnormal   Collection Time: 09/27/16  7:47 AM  Result Value Ref Range   Sodium 138 135 - 145 mmol/L   Potassium 4.1 3.5 - 5.1 mmol/L   Chloride 118 (H) 101 - 111 mmol/L   CO2  18 (L) 22 - 32 mmol/L   Glucose, Bld 102 (H) 65 - 99 mg/dL   BUN 32 (H) 6 - 20 mg/dL   Creatinine, Ser 1.54 (H) 0.44 - 1.00 mg/dL   Calcium 8.5 (L) 8.9 - 10.3 mg/dL   GFR calc non Af Amer 31 (L) >60 mL/min   GFR calc Af Amer 36 (L) >60 mL/min    Comment: (NOTE) The eGFR has been calculated using the CKD EPI equation. This calculation has not been validated in all clinical situations. eGFR's persistently <60 mL/min signify possible Chronic Kidney Disease.   CBC     Status: Abnormal   Collection Time: 09/27/16  7:47 AM  Result Value Ref Range   WBC 8.7 4.0 - 10.5 K/uL   RBC 3.36 (L) 3.87 - 5.11 MIL/uL   Hemoglobin 8.7 (L) 12.0 - 15.0 g/dL   HCT 26.8 (L) 36.0 - 46.0 %   MCV 79.8 78.0 - 100.0 fL   MCH 25.9 (L) 26.0 - 34.0 pg   MCHC 32.5 30.0 - 36.0 g/dL   RDW 17.7 (H) 11.5 - 15.5 %   Platelets 141 (L) 150 - 400 K/uL  Glucose, capillary     Status: Abnormal   Collection Time: 09/27/16  7:49 AM  Result Value Ref Range   Glucose-Capillary 103 (H) 65 - 99 mg/dL   Comment 1 Notify RN    Comment 2  Document in Chart   Glucose, capillary     Status: Abnormal   Collection Time: 09/27/16 11:24 AM  Result Value Ref Range   Glucose-Capillary 118 (H) 65 - 99 mg/dL   Comment 1 Notify RN    Comment 2 Document in Chart   Glucose, capillary     Status: Abnormal   Collection Time: 09/27/16  4:31 PM  Result Value Ref Range   Glucose-Capillary 106 (H) 65 - 99 mg/dL   Comment 1 Notify RN    Comment 2 Document in Chart   Glucose, capillary     Status: Abnormal   Collection Time: 09/27/16  9:35 PM  Result Value Ref Range   Glucose-Capillary 120 (H) 65 - 99 mg/dL   Comment 1 Notify RN    Comment 2 Document in Chart   Glucose, capillary     Status: Abnormal   Collection Time: 09/28/16  8:27 AM  Result Value Ref Range   Glucose-Capillary 102 (H) 65 - 99 mg/dL    ABGS No results for input(s): PHART, PO2ART, TCO2, HCO3 in the last 72 hours.  Invalid input(s): PCO2 CULTURES No results  found for this or any previous visit (from the past 240 hour(s)). Studies/Results: No results found.  Medications:  Prior to Admission:  Prescriptions Prior to Admission  Medication Sig Dispense Refill Last Dose  . acetaminophen (TYLENOL) 325 MG tablet Take 325-650 mg by mouth every 6 (six) hours as needed for mild pain, moderate pain or headache.    Past Month at Unknown time  . apixaban (ELIQUIS) 5 MG TABS tablet Take 5 mg by mouth 2 (two) times daily.    09/24/2016 at 1830  . atorvastatin (LIPITOR) 40 MG tablet Take 40 mg by mouth at bedtime.   09/23/2016 at Unknown time  . benazepril (LOTENSIN) 40 MG tablet Take 40 mg by mouth daily.    09/24/2016 at Unknown time  . docusate sodium (COLACE) 100 MG capsule Take 100 mg by mouth daily.   09/24/2016 at Unknown time  . ferrous sulfate 325 (65 FE) MG tablet Take 325 mg by mouth daily with breakfast.   09/24/2016 at Unknown time  . ibuprofen (ADVIL,MOTRIN) 200 MG tablet Take 600 mg by mouth every 6 (six) hours as needed for headache, mild pain or moderate pain.   09/24/2016 at 2030  . isosorbide mononitrate (IMDUR) 30 MG 24 hr tablet Take 1 tablet (30 mg total) by mouth daily. 30 tablet 3 Past Week at Unknown time  . ketorolac (ACULAR) 0.5 % ophthalmic solution Place 1 drop into the right eye 3 (three) times daily.   09/24/2016 at Unknown time  . levETIRAcetam (KEPPRA) 500 MG tablet Take 500 mg by mouth 2 (two) times daily.   09/24/2016 at Unknown time  . lubiprostone (AMITIZA) 24 MCG capsule Take 24 mcg by mouth 2 (two) times daily with a meal.   09/24/2016 at Unknown time  . Melatonin 3 MG TABS Take 3 mg by mouth at bedtime as needed (for sleep).   Past Week at Unknown time  . metFORMIN (GLUCOPHAGE) 500 MG tablet Take 500 mg by mouth 2 (two) times daily with a meal.    09/24/2016 at Unknown time  . metoprolol tartrate (LOPRESSOR) 25 MG tablet Take 25 mg by mouth 2 (two) times daily.    09/24/2016 at 1830  . nitroGLYCERIN (NITROSTAT) 0.4 MG SL  tablet Place 1 tablet (0.4 mg total) under the tongue every 5 (five) minutes as needed for chest pain. 25  tablet 1 Past Month at Unknown time  . omega-3 acid ethyl esters (LOVAZA) 1 G capsule Take 2 g by mouth 2 (two) times daily.    09/24/2016 at Unknown time  . ondansetron (ZOFRAN-ODT) 4 MG disintegrating tablet Take 4 mg by mouth every 8 (eight) hours as needed for nausea or vomiting.   Past Month at Unknown time  . pantoprazole (PROTONIX) 40 MG tablet Take 1 tablet (40 mg total) by mouth daily. 30 tablet 3 09/24/2016 at Unknown time  . potassium chloride (K-DUR,KLOR-CON) 10 MEQ tablet Take 10 mEq by mouth daily.   09/24/2016 at Unknown time  . traZODone (DESYREL) 50 MG tablet Take 50 mg by mouth at bedtime.   09/23/2016 at Unknown time   Scheduled: . apixaban  5 mg Oral BID  . atorvastatin  40 mg Oral QHS  . feeding supplement (ENSURE ENLIVE)  237 mL Oral BID BM  . ferrous sulfate  325 mg Oral Q breakfast  . insulin aspart  0-9 Units Subcutaneous TID WC  . isosorbide mononitrate  30 mg Oral Daily  . ketorolac  1 drop Right Eye TID  . levETIRAcetam  500 mg Oral BID  . lubiprostone  24 mcg Oral BID WC  . omega-3 acid ethyl esters  2 g Oral BID  . pantoprazole  40 mg Oral Daily  . potassium chloride  10 mEq Oral Daily  . traZODone  50 mg Oral QHS   Continuous: . sodium chloride 10 mL/hr at 09/27/16 8032   ZYY:[id number], ondansetron **OR** ondansetron (ZOFRAN) IV  Assesment:She was admitted with dehydration and acute kidney injury gastroparesis and she has improved. She fell and struck her head and I think she probably has a concussion and postconcussive headache. CT was okay. I think she's ready for discharge today. Renal function has markedly improved. Active Problems:   Gastroparesis   PACEMAKER, PERMANENT   Chronic diastolic heart failure (HCC)   Diabetes mellitus, type II ([place])   Hypertension   Arteriosclerotic cardiovascular disease (ASCVD)   Gastroesophageal reflux  disease   Hyperlipidemia   Sleep apnea   Chronic kidney disease (CKD), stage III (moderate)   Seizure disorder (HCC)   Dehydration   Spinal stenosis, lumbar region, with neurogenic claudication   Atrial fibrillation ([place])   AKI (acute kidney injury) ([place])   Acute kidney injury (nontraumatic) ([place])   Fall   Acute kidney injury ([place])    Plan: Discharge home    LOS: 3 days   [name] L 09/28/2016, 8:41 AM

## 2016-09-28 NOTE — Discharge Summary (Signed)
Physician Discharge Summary  Patient ID: [name] MRN: [id number] DOB/AGE: [birth year] 75 y.o. Primary Care Physician:Tammela Bales L, MD Admit date: 09/24/2016 Discharge date: 09/28/2016    Discharge Diagnoses:   Active Problems:   Gastroparesis   PACEMAKER, PERMANENT   Chronic diastolic heart failure (HCC)   Diabetes mellitus, type II (HCC)   Hypertension   Arteriosclerotic cardiovascular disease (ASCVD)   Gastroesophageal reflux disease   Hyperlipidemia   Sleep apnea   Chronic kidney disease (CKD), stage III (moderate)   Seizure disorder (HCC)   Dehydration   Spinal stenosis, lumbar region, with neurogenic claudication   Atrial fibrillation (HCC)   AKI (acute kidney injury) (HCC)   Acute kidney injury (nontraumatic) (HCC)   Fall   Acute kidney injury (HCC)   Concussion with no loss of consciousness     Medication List    TAKE these medications   acetaminophen 325 MG tablet Commonly known as:  TYLENOL Take 325-650 mg by mouth every 6 (six) hours as needed for mild pain, moderate pain or headache.   apixaban 5 MG Tabs tablet Commonly known as:  ELIQUIS Take 5 mg by mouth 2 (two) times daily.   atorvastatin 40 MG tablet Commonly known as:  LIPITOR Take 40 mg by mouth at bedtime.   benazepril 40 MG tablet Commonly known as:  LOTENSIN Take 40 mg by mouth daily.   docusate sodium 100 MG capsule Commonly known as:  COLACE Take 100 mg by mouth daily.   ferrous sulfate 325 (65 FE) MG tablet Take 325 mg by mouth daily with breakfast.   ibuprofen 200 MG tablet Commonly known as:  ADVIL,MOTRIN Take 600 mg by mouth every 6 (six) hours as needed for headache, mild pain or moderate pain.   isosorbide mononitrate 30 MG 24 hr tablet Commonly known as:  IMDUR Take 1 tablet (30 mg total) by mouth daily.   ketorolac 0.5 % ophthalmic solution Commonly known as:  ACULAR Place 1 drop into the right eye 3 (three) times daily.   levETIRAcetam 500 MG  tablet Commonly known as:  KEPPRA Take 500 mg by mouth 2 (two) times daily.   lubiprostone 24 MCG capsule Commonly known as:  AMITIZA Take 24 mcg by mouth 2 (two) times daily with a meal.   Melatonin 3 MG Tabs Take 3 mg by mouth at bedtime as needed (for sleep).   metFORMIN 500 MG tablet Commonly known as:  GLUCOPHAGE Take 500 mg by mouth 2 (two) times daily with a meal.   metoprolol tartrate 25 MG tablet Commonly known as:  LOPRESSOR Take 25 mg by mouth 2 (two) times daily.   nitroGLYCERIN 0.4 MG SL tablet Commonly known as:  NITROSTAT Place 1 tablet (0.4 mg total) under the tongue every 5 (five) minutes as needed for chest pain.   omega-3 acid ethyl esters 1 g capsule Commonly known as:  LOVAZA Take 2 g by mouth 2 (two) times daily.   ondansetron 4 MG disintegrating tablet Commonly known as:  ZOFRAN-ODT Take 4 mg by mouth every 8 (eight) hours as needed for nausea or vomiting.   pantoprazole 40 MG tablet Commonly known as:  PROTONIX Take 1 tablet (40 mg total) by mouth daily.   potassium chloride 10 MEQ tablet Commonly known as:  K-DUR,KLOR-CON Take 10 mEq by mouth daily.   traZODone 50 MG tablet Commonly known as:  DESYREL Take 50 mg by mouth at bedtime.       Discharged Condition:Improved    Consults: None  Significant Diagnostic Studies: Dg Chest 2 View  Result Date: 08/29/2016 CLINICAL DATA:  Severe substernal chest pain today with shortness of breath and diaphoresis. EXAM: CHEST  2 VIEW COMPARISON:  08/09/2016 and 02/12/2015. FINDINGS: Stable cardiomegaly and aortic atherosclerosis post CABG. Left subclavian Port-A-Cath tip is unchanged in the mid SVC. There are right subclavian pacemaker leads in the right atrium and right ventricle. The lungs are clear. There is no pleural effusion or pneumothorax. No acute osseous findings are seen. There are mild degenerative changes throughout the spine associated with a scoliosis. IMPRESSION: Stable postoperative  chest.  Mild cardiomegaly post CABG. Electronically Signed   By: Carey Bullocks M.D.   On: 08/29/2016 12:24   Ct Head Wo Contrast  Result Date: 09/24/2016 CLINICAL DATA:  Patient fell twice.  Confused. EXAM: CT HEAD WITHOUT CONTRAST TECHNIQUE: Contiguous axial images were obtained from the base of the skull through the vertex without intravenous contrast. COMPARISON:  CT from 07/22/2013 FINDINGS: BRAIN: The ventricles and sulci are normal for age. No intraparenchymal hemorrhage, mass effect nor midline shift. Some progression of chronic small vessel ischemic disease since prior exam involving the periventricular and subcortical white matter. No acute large vascular territory infarcts. No abnormal extra-axial fluid collections. Basal cisterns are patent. VASCULAR: Moderate calcific atherosclerosis of the carotid siphons. SKULL: No skull fracture. No significant scalp soft tissue swelling. SINUSES/ORBITS: The mastoid air-cells and included paranasal sinuses are well-aerated.The included ocular globes and orbital contents are non-suspicious. OTHER: None. IMPRESSION: Chronic small vessel ischemic disease, progressed since 2014. No acute intracranial abnormality identified. Electronically Signed   By: Tollie Eth M.D.   On: 09/24/2016 23:26    Lab Results: Basic Metabolic Panel:  Recent Labs  16/10/96 0616 09/27/16 0747  NA 138 138  K 4.0 4.1  CL 116* 118*  CO2 16* 18*  GLUCOSE 104* 102*  BUN 48* 32*  CREATININE 1.87* 1.54*  CALCIUM 9.2 8.5*   Liver Function Tests: No results for input(s): AST, ALT, ALKPHOS, BILITOT, PROT, ALBUMIN in the last 72 hours.   CBC:  Recent Labs  09/27/16 0747  WBC 8.7  HGB 8.7*  HCT 26.8*  MCV 79.8  PLT 141*    No results found for this or any previous visit (from the past 240 hour(s)).   Hospital Course: This is a 75 year old with multiple medical problems who had been having trouble with weakness and poor appetite at home. She thinks this was  related to the addition of Imdur to her cardiac medications. I saw her in my office and reduced her dose and she said she felt better but continued to have poor appetite and she fell at home. It is not clear if she struck her head but she complained of headache so I think she probably struck her head and had a concussion. CT did not show any acute intracranial abnormalities. She was dehydrated and had acute kidney injury. She has chronic renal failure at baseline. She was treated with IV fluids and analgesics and improved. Her hemoglobin level trended downward and was 8.7 the day prior to discharge this is related to anemia of chronic disease. This is from her renal failure. By the time of discharge she was much improved able to ambulate eating well still complaining of headache.  Discharge Exam: Blood pressure 128/85, pulse 98, temperature 98.5 F (36.9 C), temperature source Oral, resp. rate 17, height 5' (1.524 m), weight 85.1 kg (187 lb 9.8 oz), SpO2 99 %. She is awake and alert. Pupils  are reactive nose and throat are clear. Chest is clear. Her abdomen is soft  Disposition: Home with home health services  Discharge Instructions    Discharge patient    Complete by:  As directed    Face-to-face encounter (required for Medicare/Medicaid patients)    Complete by:  As directed    I Aranza Geddes L certify that this patient is under my care and that I, or a nurse practitioner or physician's assistant working with me, had a face-to-face encounter that meets the physician face-to-face encounter requirements with this patient on 09/28/2016. The encounter with the patient was in whole, or in part for the following medical condition(s) which is the primary reason for home health care (List medical condition): Acute kidney injury/dehydration/fall   The encounter with the patient was in whole, or in part, for the following medical condition, which is the primary reason for home health care:  Acute kidney  injury/dehydration/fall   I certify that, based on my findings, the following services are medically necessary home health services:   Nursing Physical therapy     Reason for Medically Necessary Home Health Services:  Skilled Nursing- Change/Decline in Patient Status   My clinical findings support the need for the above services:  Unsafe ambulation due to balance issues   Further, I certify that my clinical findings support that this patient is homebound due to:  Unable to leave home safely without assistance   Home Health    Complete by:  As directed    To provide the following care/treatments:   PT RN Home Health Aide          Signed: Raiquan Chandler L   09/28/2016, 8:48 AM

## 2016-09-28 NOTE — Care Management Note (Signed)
Case Management Note  Patient Details  Name: Dayle PointsSilver P Schuneman MRN: [id number] Date of Birth: [birth year]   Expected Discharge Date:  09/28/16               Expected Discharge Plan:  Home w Home Health Services  In-House Referral:  NA  Discharge planning Services  CM Consult  Post Acute Care Choice:  Home Health, Resumption of Svcs/PTA Provider Choice offered to:     DME Arranged:    DME Agency:     HH Arranged:  RN, PT HH Agency:  [facility]  Status of Service:  Completed, signed off  If discussed at Long Length of Stay Meetings, dates discussed:    Additional Comments: Patient discharging home today. She is much improved with mobility and orientation. Home health services will be resumed. No other CM needs.  Archie Atilano, Chrystine OilerSharley Diane, RN 09/28/2016, 10:05 AM

## 2016-09-28 NOTE — Progress Notes (Signed)
Discharged PT per MD order and protocol. Discharge handouts reviewed/explained. Education completed.  Pt verbalized understanding and left with all belongings. VSS. Port de-accessed.  Patient wheeled down by staff member. Lesly Dukesachel J Everett, RN

## 2016-09-28 NOTE — Progress Notes (Signed)
Physician Signed Pulmonology  Discharge Summaries Date of Service: 09/28/2016 8:41 AM    Expand All Collapse All   _0 Hide copied text _1 Hover for attribution information Subjective: She feels better. She has no complaints except headache. I think she probably struck her head when she fell and this is likely from concussion. She's ready for discharge. She has no other new complaints. She is eating well. No chest pain. No nausea or vomiting.  Objective: Vital signs in last 24 hours: Temp:  [98.5 F (36.9 C)-98.7 F (37.1 C)] 98.5 F (36.9 C) (10/25 0655) Pulse Rate:  [57-100] 98 (10/25 0655) Resp:  [16-20] 17 (10/25 0655) BP: (123-138)/(84-92) 128/85 (10/25 0655) SpO2:  [99 %-100 %] 99 % (10/24 2138) Weight change:  Last BM Date: 09/24/16  Intake/Output from previous day: 10/24 0701 - 10/25 0700 In: 1291 [P.O.:960; I.V.:331] Out: 200 [Urine:200]  PHYSICAL EXAM General appearance: alert, cooperative and mild distress Resp: clear to auscultation bilaterally Cardio: She has well-controlled atrial fib GI: soft, non-tender; bowel sounds normal; no masses,  no organomegaly Extremities: extremities normal, atraumatic, no cyanosis or edema Her pupils are equal round and reactive to light. Mucous membranes are moist. Skin is warm and dry  Lab Results:  Lab Results Last 48 Hours        Results for orders placed or performed during the hospital encounter of 09/24/16 (from the past 48 hour(s))  Glucose, capillary     Status: Abnormal   Collection Time: 09/26/16 11:11 AM  Result Value Ref Range   Glucose-Capillary 158 (H) 65 - 99 mg/dL  Glucose, capillary     Status: Abnormal   Collection Time: 09/26/16  4:56 PM  Result Value Ref Range   Glucose-Capillary 105 (H) 65 - 99 mg/dL  Glucose, capillary     Status: Abnormal   Collection Time: 09/26/16  9:22 PM  Result Value Ref Range   Glucose-Capillary 111 (H) 65 - 99 mg/dL   Comment 1 Notify RN    Comment 2 Document in  Chart   Basic metabolic panel     Status: Abnormal   Collection Time: 09/27/16  7:47 AM  Result Value Ref Range   Sodium 138 135 - 145 mmol/L   Potassium 4.1 3.5 - 5.1 mmol/L   Chloride 118 (H) 101 - 111 mmol/L   CO2 18 (L) 22 - 32 mmol/L   Glucose, Bld 102 (H) 65 - 99 mg/dL   BUN 32 (H) 6 - 20 mg/dL   Creatinine, Ser 1.54 (H) 0.44 - 1.00 mg/dL   Calcium 8.5 (L) 8.9 - 10.3 mg/dL   GFR calc non Af Amer 31 (L) >60 mL/min   GFR calc Af Amer 36 (L) >60 mL/min    Comment: (NOTE) The eGFR has been calculated using the CKD EPI equation. This calculation has not been validated in all clinical situations. eGFR's persistently <60 mL/min signify possible Chronic Kidney Disease.  CBC     Status: Abnormal   Collection Time: 09/27/16  7:47 AM  Result Value Ref Range   WBC 8.7 4.0 - 10.5 K/uL   RBC 3.36 (L) 3.87 - 5.11 MIL/uL   Hemoglobin 8.7 (L) 12.0 - 15.0 g/dL   HCT 26.8 (L) 36.0 - 46.0 %   MCV 79.8 78.0 - 100.0 fL   MCH 25.9 (L) 26.0 - 34.0 pg   MCHC 32.5 30.0 - 36.0 g/dL   RDW 17.7 (H) 11.5 - 15.5 %   Platelets 141 (L) 150 - 400 K/uL  Glucose, capillary  Status: Abnormal   Collection Time: 09/27/16  7:49 AM  Result Value Ref Range   Glucose-Capillary 103 (H) 65 - 99 mg/dL   Comment 1 Notify RN    Comment 2 Document in Chart   Glucose, capillary     Status: Abnormal   Collection Time: 09/27/16 11:24 AM  Result Value Ref Range   Glucose-Capillary 118 (H) 65 - 99 mg/dL   Comment 1 Notify RN    Comment 2 Document in Chart   Glucose, capillary     Status: Abnormal   Collection Time: 09/27/16  4:31 PM  Result Value Ref Range   Glucose-Capillary 106 (H) 65 - 99 mg/dL   Comment 1 Notify RN    Comment 2 Document in Chart   Glucose, capillary     Status: Abnormal   Collection Time: 09/27/16  9:35 PM  Result Value Ref Range   Glucose-Capillary 120 (H) 65 - 99 mg/dL   Comment 1 Notify RN    Comment 2 Document in Chart   Glucose,  capillary     Status: Abnormal   Collection Time: 09/28/16  8:27 AM  Result Value Ref Range   Glucose-Capillary 102 (H) 65 - 99 mg/dL      ABGS  Recent Labs (last 2 labs)   No results for input(s): PHART, PO2ART, TCO2, HCO3 in the last 72 hours.  Invalid input(s): PCO2   CULTURES No results found for this or any previous visit (from the past 240 hour(s)). Studies/Results: Imaging Results (Last 48 hours)  No results found.    Medications:  Prior to Admission:         Prescriptions Prior to Admission  Medication Sig Dispense Refill Last Dose  . acetaminophen (TYLENOL) 325 MG tablet Take 325-650 mg by mouth every 6 (six) hours as needed for mild pain, moderate pain or headache.    Past Month at Unknown time  . apixaban (ELIQUIS) 5 MG TABS tablet Take 5 mg by mouth 2 (two) times daily.    09/24/2016 at 1830  . atorvastatin (LIPITOR) 40 MG tablet Take 40 mg by mouth at bedtime.   09/23/2016 at Unknown time  . benazepril (LOTENSIN) 40 MG tablet Take 40 mg by mouth daily.    09/24/2016 at Unknown time  . docusate sodium (COLACE) 100 MG capsule Take 100 mg by mouth daily.   09/24/2016 at Unknown time  . ferrous sulfate 325 (65 FE) MG tablet Take 325 mg by mouth daily with breakfast.   09/24/2016 at Unknown time  . ibuprofen (ADVIL,MOTRIN) 200 MG tablet Take 600 mg by mouth every 6 (six) hours as needed for headache, mild pain or moderate pain.   09/24/2016 at 2030  . isosorbide mononitrate (IMDUR) 30 MG 24 hr tablet Take 1 tablet (30 mg total) by mouth daily. 30 tablet 3 Past Week at Unknown time  . ketorolac (ACULAR) 0.5 % ophthalmic solution Place 1 drop into the right eye 3 (three) times daily.   09/24/2016 at Unknown time  . levETIRAcetam (KEPPRA) 500 MG tablet Take 500 mg by mouth 2 (two) times daily.   09/24/2016 at Unknown time  . lubiprostone (AMITIZA) 24 MCG capsule Take 24 mcg by mouth 2 (two) times daily with a meal.   09/24/2016 at Unknown time  .  Melatonin 3 MG TABS Take 3 mg by mouth at bedtime as needed (for sleep).   Past Week at Unknown time  . metFORMIN (GLUCOPHAGE) 500 MG tablet Take 500 mg by mouth 2 (two)  times daily with a meal.    09/24/2016 at Unknown time  . metoprolol tartrate (LOPRESSOR) 25 MG tablet Take 25 mg by mouth 2 (two) times daily.    09/24/2016 at 1830  . nitroGLYCERIN (NITROSTAT) 0.4 MG SL tablet Place 1 tablet (0.4 mg total) under the tongue every 5 (five) minutes as needed for chest pain. 25 tablet 1 Past Month at Unknown time  . omega-3 acid ethyl esters (LOVAZA) 1 G capsule Take 2 g by mouth 2 (two) times daily.    09/24/2016 at Unknown time  . ondansetron (ZOFRAN-ODT) 4 MG disintegrating tablet Take 4 mg by mouth every 8 (eight) hours as needed for nausea or vomiting.   Past Month at Unknown time  . pantoprazole (PROTONIX) 40 MG tablet Take 1 tablet (40 mg total) by mouth daily. 30 tablet 3 09/24/2016 at Unknown time  . potassium chloride (K-DUR,KLOR-CON) 10 MEQ tablet Take 10 mEq by mouth daily.   09/24/2016 at Unknown time  . traZODone (DESYREL) 50 MG tablet Take 50 mg by mouth at bedtime.   09/23/2016 at Unknown time   Scheduled: . apixaban  5 mg Oral BID  . atorvastatin  40 mg Oral QHS  . feeding supplement (ENSURE ENLIVE)  237 mL Oral BID BM  . ferrous sulfate  325 mg Oral Q breakfast  . insulin aspart  0-9 Units Subcutaneous TID WC  . isosorbide mononitrate  30 mg Oral Daily  . ketorolac  1 drop Right Eye TID  . levETIRAcetam  500 mg Oral BID  . lubiprostone  24 mcg Oral BID WC  . omega-3 acid ethyl esters  2 g Oral BID  . pantoprazole  40 mg Oral Daily  . potassium chloride  10 mEq Oral Daily  . traZODone  50 mg Oral QHS   Continuous: . sodium chloride 10 mL/hr at 09/27/16 5597   CBU:LAGTXMIWOEHOZ, ondansetron **OR** ondansetron (ZOFRAN) IV  Assesment:She was admitted with dehydration and acute kidney injury gastroparesis and she has improved. She fell and struck her head  and I think she probably has a concussion and postconcussive headache. CT was okay. I think she's ready for discharge today. Renal function has markedly improved. Active Problems:   Gastroparesis   PACEMAKER, PERMANENT   Chronic diastolic heart failure (HCC)   Diabetes mellitus, type II ([place])   Hypertension   Arteriosclerotic cardiovascular disease (ASCVD)   Gastroesophageal reflux disease   Hyperlipidemia   Sleep apnea   Chronic kidney disease (CKD), stage III (moderate)   Seizure disorder (HCC)   Dehydration   Spinal stenosis, lumbar region, with neurogenic claudication   Atrial fibrillation ([place])   AKI (acute kidney injury) ([place])   Acute kidney injury (nontraumatic) ([place])   Fall   Acute kidney injury ([place])    Plan: Discharge home    LOS: 3 days   Tibor Lemmons L 09/28/2016, 8:41 AM     Routing History

## 2016-09-29 ENCOUNTER — Other Ambulatory Visit: Payer: Self-pay | Admitting: *Deleted

## 2016-09-29 DIAGNOSIS — I130 Hypertensive heart and chronic kidney disease with heart failure and stage 1 through stage 4 chronic kidney disease, or unspecified chronic kidney disease: Secondary | ICD-10-CM | POA: Diagnosis not present

## 2016-09-29 DIAGNOSIS — G894 Chronic pain syndrome: Secondary | ICD-10-CM | POA: Diagnosis not present

## 2016-09-29 DIAGNOSIS — I509 Heart failure, unspecified: Secondary | ICD-10-CM | POA: Diagnosis not present

## 2016-09-29 DIAGNOSIS — M48061 Spinal stenosis, lumbar region without neurogenic claudication: Secondary | ICD-10-CM | POA: Diagnosis not present

## 2016-09-29 DIAGNOSIS — M159 Polyosteoarthritis, unspecified: Secondary | ICD-10-CM | POA: Diagnosis not present

## 2016-09-29 NOTE — Patient Outreach (Signed)
[facility] Care Management  09/29/2016  Reda P Kenner October 31, 1941 [id number]  Unable to reach Mrs. Crownover by phone today for transition of care assessment.   Plan: I will reach out to Mrs. Vanaken again tomorrow.    Marja Kayslisa Sherol Sabas MHA,BSN,RN,CCM [facility]  [phone]

## 2016-09-30 ENCOUNTER — Other Ambulatory Visit: Payer: Self-pay | Admitting: *Deleted

## 2016-09-30 ENCOUNTER — Encounter: Payer: Self-pay | Admitting: *Deleted

## 2016-09-30 DIAGNOSIS — G894 Chronic pain syndrome: Secondary | ICD-10-CM | POA: Diagnosis not present

## 2016-09-30 DIAGNOSIS — I509 Heart failure, unspecified: Secondary | ICD-10-CM | POA: Diagnosis not present

## 2016-09-30 DIAGNOSIS — M159 Polyosteoarthritis, unspecified: Secondary | ICD-10-CM | POA: Diagnosis not present

## 2016-09-30 DIAGNOSIS — I130 Hypertensive heart and chronic kidney disease with heart failure and stage 1 through stage 4 chronic kidney disease, or unspecified chronic kidney disease: Secondary | ICD-10-CM | POA: Diagnosis not present

## 2016-09-30 DIAGNOSIS — M48061 Spinal stenosis, lumbar region without neurogenic claudication: Secondary | ICD-10-CM | POA: Diagnosis not present

## 2016-09-30 NOTE — Patient Outreach (Signed)
[facility] Care Management  09/30/2016  [name] [birth year] [id number]   Christine P Crewsis an 75 y.o.femalewith with medical history significant of coronary artery disease with coronary artery bypass grafting in 2010, hypertension, hyperlipidemia, diabetes type II, permanent pacemaker, afib on Eliquis who presented to the ED on 08/29/16 with complaint of chest pain after performing her usual household duties. She was evaluated and discharged to home. [name] had been having daily falls. We reached out to her primary care provider who ordered HHPT. Unfortunately, [name] continued to have falls and readmitted to the hospital 09/24/16. She was dehydrated and found to have AKI for which she was treated and released on 09/28/16.   I reached out to [name] today for transition of care assessment. Patient was recently discharged from hospital and all medications have been reviewed. [name] is receiving home health services (HHPT and nursing) and says she has not had any falls since returning to home. She does not yet have a follow up appointment with Dr. Juanetta GoslingHawkins so I called to request for her.   Plan: I will follow up with [name] weekly or in person over the next 31 days for continued transition of care assessment and follow up.    [name] MHA,BSN,RN,CCM [facility] Care [facility]  [phone]

## 2016-10-03 DIAGNOSIS — M159 Polyosteoarthritis, unspecified: Secondary | ICD-10-CM | POA: Diagnosis not present

## 2016-10-03 DIAGNOSIS — M48061 Spinal stenosis, lumbar region without neurogenic claudication: Secondary | ICD-10-CM | POA: Diagnosis not present

## 2016-10-03 DIAGNOSIS — I509 Heart failure, unspecified: Secondary | ICD-10-CM | POA: Diagnosis not present

## 2016-10-03 DIAGNOSIS — I130 Hypertensive heart and chronic kidney disease with heart failure and stage 1 through stage 4 chronic kidney disease, or unspecified chronic kidney disease: Secondary | ICD-10-CM | POA: Diagnosis not present

## 2016-10-03 DIAGNOSIS — G894 Chronic pain syndrome: Secondary | ICD-10-CM | POA: Diagnosis not present

## 2016-10-04 DIAGNOSIS — I509 Heart failure, unspecified: Secondary | ICD-10-CM | POA: Diagnosis not present

## 2016-10-04 DIAGNOSIS — I130 Hypertensive heart and chronic kidney disease with heart failure and stage 1 through stage 4 chronic kidney disease, or unspecified chronic kidney disease: Secondary | ICD-10-CM | POA: Diagnosis not present

## 2016-10-04 DIAGNOSIS — M48061 Spinal stenosis, lumbar region without neurogenic claudication: Secondary | ICD-10-CM | POA: Diagnosis not present

## 2016-10-04 DIAGNOSIS — G894 Chronic pain syndrome: Secondary | ICD-10-CM | POA: Diagnosis not present

## 2016-10-04 DIAGNOSIS — M159 Polyosteoarthritis, unspecified: Secondary | ICD-10-CM | POA: Diagnosis not present

## 2016-10-05 DIAGNOSIS — G894 Chronic pain syndrome: Secondary | ICD-10-CM | POA: Diagnosis not present

## 2016-10-05 DIAGNOSIS — I509 Heart failure, unspecified: Secondary | ICD-10-CM | POA: Diagnosis not present

## 2016-10-05 DIAGNOSIS — M159 Polyosteoarthritis, unspecified: Secondary | ICD-10-CM | POA: Diagnosis not present

## 2016-10-05 DIAGNOSIS — M48061 Spinal stenosis, lumbar region without neurogenic claudication: Secondary | ICD-10-CM | POA: Diagnosis not present

## 2016-10-05 DIAGNOSIS — I130 Hypertensive heart and chronic kidney disease with heart failure and stage 1 through stage 4 chronic kidney disease, or unspecified chronic kidney disease: Secondary | ICD-10-CM | POA: Diagnosis not present

## 2016-10-06 DIAGNOSIS — M48061 Spinal stenosis, lumbar region without neurogenic claudication: Secondary | ICD-10-CM | POA: Diagnosis not present

## 2016-10-06 DIAGNOSIS — M159 Polyosteoarthritis, unspecified: Secondary | ICD-10-CM | POA: Diagnosis not present

## 2016-10-06 DIAGNOSIS — I509 Heart failure, unspecified: Secondary | ICD-10-CM | POA: Diagnosis not present

## 2016-10-06 DIAGNOSIS — G894 Chronic pain syndrome: Secondary | ICD-10-CM | POA: Diagnosis not present

## 2016-10-06 DIAGNOSIS — I130 Hypertensive heart and chronic kidney disease with heart failure and stage 1 through stage 4 chronic kidney disease, or unspecified chronic kidney disease: Secondary | ICD-10-CM | POA: Diagnosis not present

## 2016-10-07 ENCOUNTER — Other Ambulatory Visit: Payer: Self-pay | Admitting: *Deleted

## 2016-10-07 DIAGNOSIS — N390 Urinary tract infection, site not specified: Secondary | ICD-10-CM | POA: Diagnosis not present

## 2016-10-07 NOTE — Patient Outreach (Signed)
[facility] Care Management  10/07/2016  [name] [birth year] [id number]  Christine P Crewsis an 75 y.o.femalewith with medical history significant of coronary artery disease with coronary artery bypass grafting in 2010, hypertension, hyperlipidemia, diabetes type II, permanent pacemaker, afib on Eliquis who presented to the ED on 08/29/16 with complaint of chest pain after performing her usual household duties. She was evaluated and discharged to home. [name] had been having daily falls. We reached out to her primary care provider who ordered HHPT. Unfortunately, [name] continued to have falls and readmitted to the hospital 09/24/16. She was dehydrated and found to have AKI for which she was treated and released on 09/28/16.   I reached out to [name] today for continued transition of care follow up. [name] is receiving home health services (HHPT and nursing) and says she has not had any falls since returning to home. She says she feels as well as she has in several months.   Plan: I will continue weekly outreach to [name] as per our transition of care protocol.    [name] MHA,BSN,RN,CCM [facility] Care [facility]  [phone]

## 2016-10-10 DIAGNOSIS — M159 Polyosteoarthritis, unspecified: Secondary | ICD-10-CM | POA: Diagnosis not present

## 2016-10-10 DIAGNOSIS — I509 Heart failure, unspecified: Secondary | ICD-10-CM | POA: Diagnosis not present

## 2016-10-10 DIAGNOSIS — G894 Chronic pain syndrome: Secondary | ICD-10-CM | POA: Diagnosis not present

## 2016-10-10 DIAGNOSIS — M48061 Spinal stenosis, lumbar region without neurogenic claudication: Secondary | ICD-10-CM | POA: Diagnosis not present

## 2016-10-10 DIAGNOSIS — I130 Hypertensive heart and chronic kidney disease with heart failure and stage 1 through stage 4 chronic kidney disease, or unspecified chronic kidney disease: Secondary | ICD-10-CM | POA: Diagnosis not present

## 2016-10-11 DIAGNOSIS — M48061 Spinal stenosis, lumbar region without neurogenic claudication: Secondary | ICD-10-CM | POA: Diagnosis not present

## 2016-10-11 DIAGNOSIS — G894 Chronic pain syndrome: Secondary | ICD-10-CM | POA: Diagnosis not present

## 2016-10-11 DIAGNOSIS — I130 Hypertensive heart and chronic kidney disease with heart failure and stage 1 through stage 4 chronic kidney disease, or unspecified chronic kidney disease: Secondary | ICD-10-CM | POA: Diagnosis not present

## 2016-10-11 DIAGNOSIS — I509 Heart failure, unspecified: Secondary | ICD-10-CM | POA: Diagnosis not present

## 2016-10-11 DIAGNOSIS — M159 Polyosteoarthritis, unspecified: Secondary | ICD-10-CM | POA: Diagnosis not present

## 2016-10-12 DIAGNOSIS — I509 Heart failure, unspecified: Secondary | ICD-10-CM | POA: Diagnosis not present

## 2016-10-12 DIAGNOSIS — M159 Polyosteoarthritis, unspecified: Secondary | ICD-10-CM | POA: Diagnosis not present

## 2016-10-12 DIAGNOSIS — M48061 Spinal stenosis, lumbar region without neurogenic claudication: Secondary | ICD-10-CM | POA: Diagnosis not present

## 2016-10-12 DIAGNOSIS — G894 Chronic pain syndrome: Secondary | ICD-10-CM | POA: Diagnosis not present

## 2016-10-12 DIAGNOSIS — I130 Hypertensive heart and chronic kidney disease with heart failure and stage 1 through stage 4 chronic kidney disease, or unspecified chronic kidney disease: Secondary | ICD-10-CM | POA: Diagnosis not present

## 2016-10-13 DIAGNOSIS — G894 Chronic pain syndrome: Secondary | ICD-10-CM | POA: Diagnosis not present

## 2016-10-13 DIAGNOSIS — I509 Heart failure, unspecified: Secondary | ICD-10-CM | POA: Diagnosis not present

## 2016-10-13 DIAGNOSIS — M48061 Spinal stenosis, lumbar region without neurogenic claudication: Secondary | ICD-10-CM | POA: Diagnosis not present

## 2016-10-13 DIAGNOSIS — I130 Hypertensive heart and chronic kidney disease with heart failure and stage 1 through stage 4 chronic kidney disease, or unspecified chronic kidney disease: Secondary | ICD-10-CM | POA: Diagnosis not present

## 2016-10-13 DIAGNOSIS — M159 Polyosteoarthritis, unspecified: Secondary | ICD-10-CM | POA: Diagnosis not present

## 2016-10-17 DIAGNOSIS — G894 Chronic pain syndrome: Secondary | ICD-10-CM | POA: Diagnosis not present

## 2016-10-17 DIAGNOSIS — M48061 Spinal stenosis, lumbar region without neurogenic claudication: Secondary | ICD-10-CM | POA: Diagnosis not present

## 2016-10-17 DIAGNOSIS — M159 Polyosteoarthritis, unspecified: Secondary | ICD-10-CM | POA: Diagnosis not present

## 2016-10-17 DIAGNOSIS — I509 Heart failure, unspecified: Secondary | ICD-10-CM | POA: Diagnosis not present

## 2016-10-17 DIAGNOSIS — I130 Hypertensive heart and chronic kidney disease with heart failure and stage 1 through stage 4 chronic kidney disease, or unspecified chronic kidney disease: Secondary | ICD-10-CM | POA: Diagnosis not present

## 2016-10-18 DIAGNOSIS — I130 Hypertensive heart and chronic kidney disease with heart failure and stage 1 through stage 4 chronic kidney disease, or unspecified chronic kidney disease: Secondary | ICD-10-CM | POA: Diagnosis not present

## 2016-10-18 DIAGNOSIS — I509 Heart failure, unspecified: Secondary | ICD-10-CM | POA: Diagnosis not present

## 2016-10-18 DIAGNOSIS — M48061 Spinal stenosis, lumbar region without neurogenic claudication: Secondary | ICD-10-CM | POA: Diagnosis not present

## 2016-10-18 DIAGNOSIS — M159 Polyosteoarthritis, unspecified: Secondary | ICD-10-CM | POA: Diagnosis not present

## 2016-10-18 DIAGNOSIS — G894 Chronic pain syndrome: Secondary | ICD-10-CM | POA: Diagnosis not present

## 2016-10-19 DIAGNOSIS — M48061 Spinal stenosis, lumbar region without neurogenic claudication: Secondary | ICD-10-CM | POA: Diagnosis not present

## 2016-10-19 DIAGNOSIS — I130 Hypertensive heart and chronic kidney disease with heart failure and stage 1 through stage 4 chronic kidney disease, or unspecified chronic kidney disease: Secondary | ICD-10-CM | POA: Diagnosis not present

## 2016-10-19 DIAGNOSIS — I509 Heart failure, unspecified: Secondary | ICD-10-CM | POA: Diagnosis not present

## 2016-10-19 DIAGNOSIS — G894 Chronic pain syndrome: Secondary | ICD-10-CM | POA: Diagnosis not present

## 2016-10-19 DIAGNOSIS — M159 Polyosteoarthritis, unspecified: Secondary | ICD-10-CM | POA: Diagnosis not present

## 2016-10-20 DIAGNOSIS — I509 Heart failure, unspecified: Secondary | ICD-10-CM | POA: Diagnosis not present

## 2016-10-20 DIAGNOSIS — M159 Polyosteoarthritis, unspecified: Secondary | ICD-10-CM | POA: Diagnosis not present

## 2016-10-20 DIAGNOSIS — G894 Chronic pain syndrome: Secondary | ICD-10-CM | POA: Diagnosis not present

## 2016-10-20 DIAGNOSIS — I130 Hypertensive heart and chronic kidney disease with heart failure and stage 1 through stage 4 chronic kidney disease, or unspecified chronic kidney disease: Secondary | ICD-10-CM | POA: Diagnosis not present

## 2016-10-20 DIAGNOSIS — M48061 Spinal stenosis, lumbar region without neurogenic claudication: Secondary | ICD-10-CM | POA: Diagnosis not present

## 2016-10-21 ENCOUNTER — Other Ambulatory Visit: Payer: Self-pay | Admitting: *Deleted

## 2016-10-21 NOTE — Patient Outreach (Signed)
[facility] Care Management  10/21/2016  [name] 01/04/1941 [id number]  Christine P Crewsis an 75 y.o.femalewith with medical history significant of coronary artery disease with coronary artery bypass grafting in 2010, hypertension, hyperlipidemia, diabetes type II, permanent pacemaker, afib on Eliquis who presented to the ED on 08/29/16 with complaint of chest pain after performing her usual household duties. She was evaluated and discharged to home. [name] had been having daily falls. We reached out to her primary care provider who ordered HHPT. Unfortunately, [name] continued to have falls and readmitted to the hospital 09/24/16. She was dehydrated and found to have AKI for which she was treated and released on 09/28/16.   I reached out to [name] today for continued transition of care follow up. [name] is receiving home health services (HHPT and nursing) and says she has not had any falls since returning to home.   [name]' only complaint is of ongoing pain in her right "side going all the way down to my hip." She reports that she is only taking her prescribed medications and doesn't want to add any over the counter medications unless advised to do so by Dr. Juanetta GoslingHawkins.    Plan: I reached out to [name] at Dr.Hawkins' office requesting that she notify Dr. Juanetta GoslingHawkins of [name] complaint. I will continue weekly outreach to [name] as per our transition of care protocol.    [name] MHA,BSN,RN,CCM [facility] Care [facility]  [phone]

## 2016-10-24 ENCOUNTER — Other Ambulatory Visit: Payer: Self-pay | Admitting: *Deleted

## 2016-10-24 DIAGNOSIS — G894 Chronic pain syndrome: Secondary | ICD-10-CM | POA: Diagnosis not present

## 2016-10-24 DIAGNOSIS — I130 Hypertensive heart and chronic kidney disease with heart failure and stage 1 through stage 4 chronic kidney disease, or unspecified chronic kidney disease: Secondary | ICD-10-CM | POA: Diagnosis not present

## 2016-10-24 DIAGNOSIS — I509 Heart failure, unspecified: Secondary | ICD-10-CM | POA: Diagnosis not present

## 2016-10-24 DIAGNOSIS — M48061 Spinal stenosis, lumbar region without neurogenic claudication: Secondary | ICD-10-CM | POA: Diagnosis not present

## 2016-10-24 DIAGNOSIS — M159 Polyosteoarthritis, unspecified: Secondary | ICD-10-CM | POA: Diagnosis not present

## 2016-10-24 NOTE — Patient Outreach (Signed)
[facility] Care Management  10/24/2016  [name] June 20, 1941 [id number]  I reached out to [name] on Friday for continued transition of care follow up. [name] stated she was still receiving home health services (HHPT and nursing) and says she had not had any falls since returning to home.   [name]' only complaint is of ongoing pain in her right "side going all the way down to my hip." She reported that she was only taking her prescribed medications and didn't want to add any over the counter medications unless advised to do so by Dr. Juanetta GoslingHawkins.   I reached out to [name] at Dr.Hawkins' office requesting that she notify Dr. Juanetta GoslingHawkins of [name] complaint. I was unable to speak with [name] by phone today for follow up but have not received other calls from her regarding pain management.   Plan: I will reach out to [name] again on Monday 10/31/16 for continued transition of care assessments.   [name] MHA,BSN,RN,CCM [facility] Care [facility]  [phone]

## 2016-10-25 DIAGNOSIS — G894 Chronic pain syndrome: Secondary | ICD-10-CM | POA: Diagnosis not present

## 2016-10-25 DIAGNOSIS — M159 Polyosteoarthritis, unspecified: Secondary | ICD-10-CM | POA: Diagnosis not present

## 2016-10-25 DIAGNOSIS — I130 Hypertensive heart and chronic kidney disease with heart failure and stage 1 through stage 4 chronic kidney disease, or unspecified chronic kidney disease: Secondary | ICD-10-CM | POA: Diagnosis not present

## 2016-10-25 DIAGNOSIS — I509 Heart failure, unspecified: Secondary | ICD-10-CM | POA: Diagnosis not present

## 2016-10-25 DIAGNOSIS — M48061 Spinal stenosis, lumbar region without neurogenic claudication: Secondary | ICD-10-CM | POA: Diagnosis not present

## 2016-10-26 DIAGNOSIS — M159 Polyosteoarthritis, unspecified: Secondary | ICD-10-CM | POA: Diagnosis not present

## 2016-10-26 DIAGNOSIS — I509 Heart failure, unspecified: Secondary | ICD-10-CM | POA: Diagnosis not present

## 2016-10-26 DIAGNOSIS — G894 Chronic pain syndrome: Secondary | ICD-10-CM | POA: Diagnosis not present

## 2016-10-26 DIAGNOSIS — I130 Hypertensive heart and chronic kidney disease with heart failure and stage 1 through stage 4 chronic kidney disease, or unspecified chronic kidney disease: Secondary | ICD-10-CM | POA: Diagnosis not present

## 2016-10-26 DIAGNOSIS — M48061 Spinal stenosis, lumbar region without neurogenic claudication: Secondary | ICD-10-CM | POA: Diagnosis not present

## 2016-10-28 DIAGNOSIS — G894 Chronic pain syndrome: Secondary | ICD-10-CM | POA: Diagnosis not present

## 2016-10-28 DIAGNOSIS — I130 Hypertensive heart and chronic kidney disease with heart failure and stage 1 through stage 4 chronic kidney disease, or unspecified chronic kidney disease: Secondary | ICD-10-CM | POA: Diagnosis not present

## 2016-10-28 DIAGNOSIS — I509 Heart failure, unspecified: Secondary | ICD-10-CM | POA: Diagnosis not present

## 2016-10-28 DIAGNOSIS — M159 Polyosteoarthritis, unspecified: Secondary | ICD-10-CM | POA: Diagnosis not present

## 2016-10-28 DIAGNOSIS — M48061 Spinal stenosis, lumbar region without neurogenic claudication: Secondary | ICD-10-CM | POA: Diagnosis not present

## 2016-10-31 ENCOUNTER — Encounter: Payer: Self-pay | Admitting: *Deleted

## 2016-10-31 ENCOUNTER — Other Ambulatory Visit: Payer: Self-pay | Admitting: *Deleted

## 2016-10-31 NOTE — Patient Outreach (Signed)
[facility] Care Management  10/31/2016  [name] 1941/11/16 [id number]  Carlena P Crewsis an 75 y.o.femalewith with medical history significant of coronary artery disease with coronary artery bypass grafting in 2010, hypertension, hyperlipidemia, diabetes type II, permanent pacemaker, afib on Eliquis who presented to the ED on 08/29/16 with complaint of chest pain after performing her usual household duties. She was evaluated and discharged to home. [name] had been having daily falls. We reached out to her primary care provider who ordered HHPT. Unfortunately, [name] continued to have falls and readmitted to the hospital 09/24/16. She was dehydrated and found to have AKI for which she was treated and released on 09/28/16.   I reached out to [name] today by phone. [name] received her last home health services (HHPT and nursing) visits yesterday and says she has not had any falls since returning to home. She reports feeling much stronger and her "usual self." She denies pain in her hip today.   [name] says she feels "independent" and doesn't feel she needs ongoing case management services. She does not wish to participate in telephonic case management services either through [facility] or her insurance provider.   Plan: I will send a letter of case closure to [name] today which will include our contact information should she feel she would benefit from case management services at any time in the future. I will notify her primary care provider of her case closure as well.    Marja Kayslisa Hakeen Shipes MHA,BSN,RN,CCM [facility]  [phone]

## 2016-11-15 ENCOUNTER — Emergency Department (HOSPITAL_COMMUNITY): Payer: Commercial Managed Care - HMO

## 2016-11-15 ENCOUNTER — Emergency Department (HOSPITAL_COMMUNITY)
Admission: EM | Admit: 2016-11-15 | Discharge: 2016-11-15 | Disposition: A | Discharge status: Home / Self Care | Payer: Commercial Managed Care - HMO | Attending: Emergency Medicine | Admitting: Emergency Medicine

## 2016-11-15 ENCOUNTER — Encounter (HOSPITAL_COMMUNITY): Payer: Self-pay | Admitting: Emergency Medicine

## 2016-11-15 DIAGNOSIS — Y929 Unspecified place or not applicable: Secondary | ICD-10-CM | POA: Insufficient documentation

## 2016-11-15 DIAGNOSIS — Z7984 Long term (current) use of oral hypoglycemic drugs: Secondary | ICD-10-CM | POA: Insufficient documentation

## 2016-11-15 DIAGNOSIS — Z951 Presence of aortocoronary bypass graft: Secondary | ICD-10-CM | POA: Diagnosis not present

## 2016-11-15 DIAGNOSIS — N183 Chronic kidney disease, stage 3 (moderate): Secondary | ICD-10-CM | POA: Insufficient documentation

## 2016-11-15 DIAGNOSIS — Y939 Activity, unspecified: Secondary | ICD-10-CM | POA: Diagnosis not present

## 2016-11-15 DIAGNOSIS — W1839XA Other fall on same level, initial encounter: Secondary | ICD-10-CM | POA: Insufficient documentation

## 2016-11-15 DIAGNOSIS — I2510 Atherosclerotic heart disease of native coronary artery without angina pectoris: Secondary | ICD-10-CM | POA: Diagnosis not present

## 2016-11-15 DIAGNOSIS — Z96653 Presence of artificial knee joint, bilateral: Secondary | ICD-10-CM | POA: Diagnosis not present

## 2016-11-15 DIAGNOSIS — M25552 Pain in left hip: Secondary | ICD-10-CM | POA: Diagnosis not present

## 2016-11-15 DIAGNOSIS — J449 Chronic obstructive pulmonary disease, unspecified: Secondary | ICD-10-CM | POA: Diagnosis not present

## 2016-11-15 DIAGNOSIS — Z791 Long term (current) use of non-steroidal anti-inflammatories (NSAID): Secondary | ICD-10-CM | POA: Diagnosis not present

## 2016-11-15 DIAGNOSIS — Y999 Unspecified external cause status: Secondary | ICD-10-CM | POA: Diagnosis not present

## 2016-11-15 DIAGNOSIS — W19XXXA Unspecified fall, initial encounter: Secondary | ICD-10-CM

## 2016-11-15 DIAGNOSIS — I130 Hypertensive heart and chronic kidney disease with heart failure and stage 1 through stage 4 chronic kidney disease, or unspecified chronic kidney disease: Secondary | ICD-10-CM | POA: Diagnosis not present

## 2016-11-15 DIAGNOSIS — Z87891 Personal history of nicotine dependence: Secondary | ICD-10-CM | POA: Diagnosis not present

## 2016-11-15 DIAGNOSIS — Z950 Presence of cardiac pacemaker: Secondary | ICD-10-CM | POA: Insufficient documentation

## 2016-11-15 DIAGNOSIS — S79912A Unspecified injury of left hip, initial encounter: Secondary | ICD-10-CM | POA: Diagnosis not present

## 2016-11-15 DIAGNOSIS — Z79899 Other long term (current) drug therapy: Secondary | ICD-10-CM | POA: Diagnosis not present

## 2016-11-15 DIAGNOSIS — E1122 Type 2 diabetes mellitus with diabetic chronic kidney disease: Secondary | ICD-10-CM | POA: Insufficient documentation

## 2016-11-15 DIAGNOSIS — R600 Localized edema: Secondary | ICD-10-CM | POA: Insufficient documentation

## 2016-11-15 DIAGNOSIS — I5032 Chronic diastolic (congestive) heart failure: Secondary | ICD-10-CM | POA: Insufficient documentation

## 2016-11-15 DIAGNOSIS — J45909 Unspecified asthma, uncomplicated: Secondary | ICD-10-CM | POA: Diagnosis not present

## 2016-11-15 DIAGNOSIS — S8992XA Unspecified injury of left lower leg, initial encounter: Secondary | ICD-10-CM | POA: Diagnosis not present

## 2016-11-15 DIAGNOSIS — M25562 Pain in left knee: Secondary | ICD-10-CM

## 2016-11-15 NOTE — ED Notes (Signed)
Pt returned from xray

## 2016-11-15 NOTE — ED Provider Notes (Signed)
Medical screening examination/treatment/procedure(s) were conducted as a shared visit with non-physician practitioner(s) and myself.  I personally evaluated the patient during the encounter.   EKG Interpretation None      75 year old female who presents With mechanical fall. She has prior history of falls, and takes Eliquis for history of DVT. I had a mechanical fall yesterday onto her left knee and left hip. She did not hit her head, had loss of consciousness. States that she's been having some left knee and left hip pain since then. This still been able to ambulate. Denies any headache, confusion, nausea or vomiting, or other neurological complaints.  She is well-appearing, with normal vital signs. No signs of head injury. I did get x-rays of the left hip and left knee without significant traumatic injuries noted. Supportive care was discussed for home. Strict return and follow-up instructions reviewed. She expressed understanding of all discharge instructions and felt comfortable with the plan of care.    Lavera Guiseana Duo Christine Durango, MD 11/15/16 [phone]

## 2016-11-15 NOTE — ED Provider Notes (Signed)
[facility] DE[facility] Provider Note   CSN: [id number] Arrival date & time: 11/15/16  1644     History   Chief Complaint Chief Complaint  Patient presents with  . Fall    HPI [name] is a 75 y.o. female with past medical history as outlined below presenting with left knee and hip pain since falling yesterday, landing directly on the knee.  She denies head injury during the fall. She is ambulatory today but with increased pain.  She does have a history of frequent falls and [facility]  She has had no treatments for this new pain prior to arrival.  Her pain is worsened with weight bearing and full extension.  She has found no alleviators.  The history is provided by the patient.    Past Medical History:  Diagnosis Date  . Anemia   . Anginal pain (HCC)    "all the time, but not heart related"  . Arthritis    "legs and back" (09/30/2013)  . Asthma    years ago  . 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 Blurred vision, bilateral    sometimes double vision  . CAD (coronary artery disease)    multivessel, CABG 6/10.  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  Myoview (11/20/11) showed no ischemia & EF 62%  . Chronic lower back pain   . COPD (chronic obstructive pulmonary disease) (HCC)    severe lung disease by PFTs 6/12  . DDD (degenerative disc disease)   . Deep vein thrombosis, upper right extremity (HCC)    post-PPM  . Dehiscence of closure of sternum or sternotomy 07/2009   sternal infection; required flap closure  . Diabetes mellitus, type II (HCC)   . Gastroesophageal reflux disease    not much  . H/O hiatal hernia   . History of kidney stones   . Hyperlipidemia   . Hypertension   . Migraines    "years ago" (09/30/2013)  . Obesity   . Orthopnea    sometimes  . Pacemaker   . PUD (peptic ulcer disease)   . Seizures (HCC)    "long time ago; don't remember what they were related to"  (09/30/2013)none recent  . Sleep apnea    denies CPAP use on 09/30/2013  . Stroke [facility](HCC)    years ago, "residual on left back"  . Urge incontinence of urine     Patient Active Problem List   Diagnosis Date Noted  . Concussion with no loss of consciousness 09/28/2016  . AKI (acute kidney injury) (HCC) 09/25/2016  . Acute kidney injury (nontraumatic) (HCC) 09/25/2016  . Fall 09/25/2016  . Acute kidney injury (HCC) 09/25/2016  . Chest pain 08/29/2016  . Atrial fibrillation (HCC) 02/06/2015  . Encephalopathy, metabolic 10/12/2014  . Intractable nausea and vomiting 10/09/2014  . Epigastric abdominal pain 10/09/2014  . Expected blood loss anemia 12/04/2013  . S/P left THA, AA 12/03/2013  . Protein-calorie malnutrition, severe (HCC) 10/02/2013  . Precordial pain 09/30/2013  . Failure to thrive 07/26/2013  . Anemia of chronic disease 07/13/2013  . Altered mental state 07/13/2013  . NSTEMI (non-ST elevated myocardial infarction) (HCC) 07/01/2013  . Acute delirium 06/29/2013  . Fecal impaction (HCC) 06/29/2013  . Total knee replacement status 05/22/2013  . H/O total hip arthroplasty 05/22/2013  . Spinal stenosis, lumbar region, with neurogenic claudication 05/22/2013  . Osteoarthritis of left hip 05/22/2013  . Noninfectious gastroenteritis and colitis  03/05/2013  . Dehydration 03/05/2013  . Family history of colon cancer 02/15/2013  . PUD (peptic ulcer disease) 02/15/2013  . Diabetes mellitus, type II (HCC)   . Hypertension   . Arteriosclerotic cardiovascular disease (ASCVD)   . Gastroesophageal reflux disease   . Hyperlipidemia   . Deep vein thrombosis, upper right extremity (HCC)   . Obesity   . AV block   . COPD (chronic obstructive pulmonary disease) (HCC)   . Sleep apnea   . Chronic kidney disease (CKD), stage III (moderate)   . Seizure disorder (HCC)   . Anemia, normocytic normochromic 12/15/2011  . Chronic diastolic heart failure (HCC) 11/18/2011  . PACEMAKER,  PERMANENT 05/13/2009  . 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 GASTRIC ULCER 05/12/2009  . Gastroparesis 05/12/2009  . DIVERTICULAR DISEASE 05/12/2009    Past Surgical History:  Procedure Laterality Date  . ABDOMINAL HYSTERECTOMY     partial  . APPENDECTOMY    . BACK SURGERY     x3 total  . CESAREAN SECTION  ~1980  . CHOLECYSTECTOMY    . COLONOSCOPY  04/27/2005   hemorrhoids, diverticula  . COLONOSCOPY WITH ESOPHAGOGASTRODUODENOSCOPY (EGD) N/A 02/20/2013   RMR: pancolonic diverticulosis and internal hemorrhoids. EGD with small hiatal hernia, healed gastric ulcer  . CORONARY ARTERY BYPASS GRAFT  05/1999   - LIMA to LAD, SVG to OM, SVG to PDA  . ESOPHAGOGASTRODUODENOSCOPY     with [facility] dilation,  biopsy, and disruption Schatzki's ring  . ESOPHAGOGASTRODUODENOSCOPY  09/10/2012   RMR: Noncritical Schatzki's ring. Diffuse gastric erosions and an  area of partially healing ulceration-status post biopsy/ Small hiatal hernia. Bx reactive gastropathy.  . INSERT / REPLACE / REMOVE PACEMAKER  1993  . JOINT REPLACEMENT    . LUMBAR DISC SURGERY    . PACEMAKER PLACEMENT  1993   Status post DDD pacemaker implantation in 1993, with Medtronic Kappa generator change in 2003 for  second-degree AV block, most recent gen change by Fawn Kirk 04/14/11  . PORTACATH PLACEMENT Left 07/17/2013   Procedure: INSERTION PORT-A-CATH-left subclavian;  Surgeon: Fabio Bering, MD;  Location: [facility];  Service: General;  Laterality: Left;  . POSTERIOR LUMBAR FUSION    . RECONSTRUCTIVE REPAIR STERNAL     for infection s/p CABG 8/10  . REPLACEMENT TOTAL KNEE Bilateral   . THYROIDECTOMY    . TOTAL HIP ARTHROPLASTY Right   . TOTAL HIP ARTHROPLASTY Left 12/03/2013   Procedure: LEFT TOTAL HIP ARTHROPLASTY ANTERIOR APPROACH;  Surgeon: Shelda Pal, MD;  Location: [facility];  Service: Orthopedics;  Laterality: Left;  . TUBAL LIGATION    . WRIST SURGERY Left    "tumor taken off" (09/30/2013)    OB History    Gravida Para Term  Preterm AB Living   5 5 5          SAB TAB Ectopic Multiple Live Births                   Home Medications    Prior to Admission medications   Medication Sig Start Date End Date Taking? Authorizing Provider  acetaminophen (TYLENOL) 325 MG tablet Take 325-650 mg by mouth every 6 (six) hours as needed for mild pain, moderate pain or headache.     Historical Provider, MD  apixaban (ELIQUIS) 5 MG TABS tablet Take 5 mg by mouth 2 (two) times daily.     Historical Provider, MD  atorvastatin (LIPITOR) 40 MG tablet Take 40 mg by mouth at bedtime.    Historical Provider,  MD  benazepril (LOTENSIN) 40 MG tablet Take 40 mg by mouth daily.     Historical Provider, MD  docusate sodium (COLACE) 100 MG capsule Take 100 mg by mouth daily.    Historical Provider, MD  ferrous sulfate 325 (65 FE) MG tablet Take 325 mg by mouth daily with breakfast.    Historical Provider, MD  ibuprofen (ADVIL,MOTRIN) 200 MG tablet Take 600 mg by mouth every 6 (six) hours as needed for headache, mild pain or moderate pain.    Historical Provider, MD  isosorbide mononitrate (IMDUR) 30 MG 24 hr tablet Take 1 tablet (30 mg total) by mouth daily. 08/31/16   Avon Gullyesfaye Fanta, MD  ketorolac (ACULAR) 0.5 % ophthalmic solution Place 1 drop into the right eye 3 (three) times daily.    Historical Provider, MD  levETIRAcetam (KEPPRA) 500 MG tablet Take 500 mg by mouth 2 (two) times daily.    Historical Provider, MD  lubiprostone (AMITIZA) 24 MCG capsule Take 24 mcg by mouth 2 (two) times daily with a meal.    Historical Provider, MD  Melatonin 3 MG TABS Take 3 mg by mouth at bedtime as needed (for sleep).    Historical Provider, MD  metFORMIN (GLUCOPHAGE) 500 MG tablet Take 500 mg by mouth 2 (two) times daily with a meal.     Historical Provider, MD  metoprolol tartrate (LOPRESSOR) 25 MG tablet Take 25 mg by mouth 2 (two) times daily.     Historical Provider, MD  nitroGLYCERIN (NITROSTAT) 0.4 MG SL tablet Place 1 tablet (0.4 mg total) under  the tongue every 5 (five) minutes as needed for chest pain. 05/20/14   Antoine PocheJonathan F Branch, MD  omega-3 acid ethyl esters (LOVAZA) 1 G capsule Take 2 g by mouth 2 (two) times daily.     Historical Provider, MD  ondansetron (ZOFRAN-ODT) 4 MG disintegrating tablet Take 4 mg by mouth every 8 (eight) hours as needed for nausea or vomiting.    Historical Provider, MD  pantoprazole (PROTONIX) 40 MG tablet Take 1 tablet (40 mg total) by mouth daily. 12/03/13   Gelene MinkAnna W Boone, NP  potassium chloride (K-DUR,KLOR-CON) 10 MEQ tablet Take 10 mEq by mouth daily.    Historical Provider, MD  traZODone (DESYREL) 50 MG tablet Take 50 mg by mouth at bedtime.    Historical Provider, MD    Family History Family History  Problem Relation Age of Onset  . Heart disease Mother     MI  . Colon cancer Father     age 77  . Diabetes    . Coronary artery disease      Female <55  . Cancer      Social History Social History  Substance Use Topics  . Smoking status: Never Smoker  . Smokeless tobacco: Former NeurosurgeonUser    Types: Snuff     Comment: 09/30/2013 "stopped snuff a long time ago"  . Alcohol use No     Allergies   Codeine; Food; Oxycodone hcl; Peanut-containing drug products; and Reglan [metoclopramide]   Review of Systems Review of Systems  Constitutional: Negative for fever.  Musculoskeletal: Positive for arthralgias. Negative for joint swelling and myalgias.  Neurological: Negative for weakness and numbness.     Physical Exam Updated Vital Signs BP 135/82 (BP Location: Right Arm)   Pulse 80   Temp 97.9 F (36.6 C) (Oral)   Resp 16   Ht 5\' 2"  (1.575 m)   Wt 81.6 kg   SpO2 100%   BMI 32.92  kg/m   Physical Exam  Constitutional: She appears well-developed and well-nourished.  HENT:  Head: Atraumatic.  Neck: Normal range of motion.  Cardiovascular:  Pulses equal bilaterally  Musculoskeletal: She exhibits tenderness.       Left knee: She exhibits swelling. She exhibits no ecchymosis, no  deformity, no erythema, normal alignment, no LCL laxity and no MCL laxity.  ttp left patella.  Mild crepitus appreciated with full extension.  Mild edema noted prepatella.  Well healed bilateral knee surgical incisions.  Neurological: She is alert. She has normal strength. She displays normal reflexes. No sensory deficit.  Skin: Skin is warm and dry.  Psychiatric: She has a normal mood and affect.     ED Treatments / Results  Labs (all labs ordered are listed, but only abnormal results are displayed) Labs Reviewed - No data to display  EKG  EKG Interpretation None       Radiology Dg Knee Complete 4 Views Left  Result Date: 11/15/2016 CLINICAL DATA:  Larey Seat yesterday and landed on left knee. History of prior surgery. EXAM: LEFT KNEE - COMPLETE 4+ VIEW COMPARISON:  None. FINDINGS: Long stem femoral prosthesis in place along with 2 large fixating screws and bone cement. No complicating features or acute fractures identified. The tibial component is well seated without complicating features. No periprosthetic fracture. A small joint effusion is noted. Vascular calcifications are noted. IMPRESSION: Surgical changes with total knee arthroplasty components but no complicating features or periprosthetic fracture. Small joint effusion. Electronically Signed   By: Rudie Meyer M.D.   On: 11/15/2016 17:22   Dg Hip Unilat W Or Wo Pelvis 2-3 Views Left  Result Date: 11/15/2016 CLINICAL DATA:  Left hip pain after fall yesterday. EXAM: DG HIP (WITH OR WITHOUT PELVIS) 2-3V LEFT COMPARISON:  Radiographs of December 24, 2014. FINDINGS: Status post bilateral total hip arthroplasties. No fracture or dislocation is noted. IMPRESSION: No acute abnormality seen involving the left hip. Electronically Signed   By: Lupita Raider, M.D.   On: 11/15/2016 18:23    Procedures Procedures (including critical care time)  Medications Ordered in ED Medications - No data to display   Initial Impression /  Assessment and Plan / ED Course  I have reviewed the triage vital signs and the nursing notes.  Pertinent labs & imaging results that were available during my care of the patient were reviewed by me and considered in my medical decision making (see chart for details).  Clinical Course     Images reviewed and discussed with pt.  Pt was also seen by Dr. Verdie Mosher prior to dc home.  Advised ice tx,  Rest.  F/u with [facility] which she and daughter at bedside plan to do - will call in the am to reinstitute this service.  Final Clinical Impressions(s) / ED Diagnoses   Final diagnoses:  Fall, initial encounter  Acute pain of left knee    New Prescriptions New Prescriptions   No medications on file     Burgess Amor, PA-C 11/15/16 1859    Lavera Guise, MD 11/16/16 [phone]

## 2016-11-15 NOTE — ED Notes (Signed)
Pt taken to xray 

## 2016-11-15 NOTE — ED Triage Notes (Signed)
Pt states she fell yesterday and injured her L knee. Pt able to ambulate to bathroom.

## 2016-12-15 DIAGNOSIS — M5126 Other intervertebral disc displacement, lumbar region: Secondary | ICD-10-CM | POA: Diagnosis not present

## 2016-12-15 DIAGNOSIS — M545 Low back pain: Secondary | ICD-10-CM | POA: Diagnosis not present

## 2016-12-15 DIAGNOSIS — M5136 Other intervertebral disc degeneration, lumbar region: Secondary | ICD-10-CM | POA: Diagnosis not present

## 2017-01-23 DIAGNOSIS — M545 Low back pain: Secondary | ICD-10-CM | POA: Diagnosis not present

## 2017-01-23 DIAGNOSIS — E1121 Type 2 diabetes mellitus with diabetic nephropathy: Secondary | ICD-10-CM | POA: Diagnosis not present

## 2017-01-23 DIAGNOSIS — N183 Chronic kidney disease, stage 3 (moderate): Secondary | ICD-10-CM | POA: Diagnosis not present

## 2017-01-23 DIAGNOSIS — I25119 Atherosclerotic heart disease of native coronary artery with unspecified angina pectoris: Secondary | ICD-10-CM | POA: Diagnosis not present

## 2017-01-26 IMAGING — DX DG PELVIS 1-2V
1 series · 1 of 1 positions shown · non-contrast
Comparison: 12/03/2013

CLINICAL DATA: Bilateral low back pain.

EXAM:
PELVIS - 1-2 VIEW

[pelvis ap]
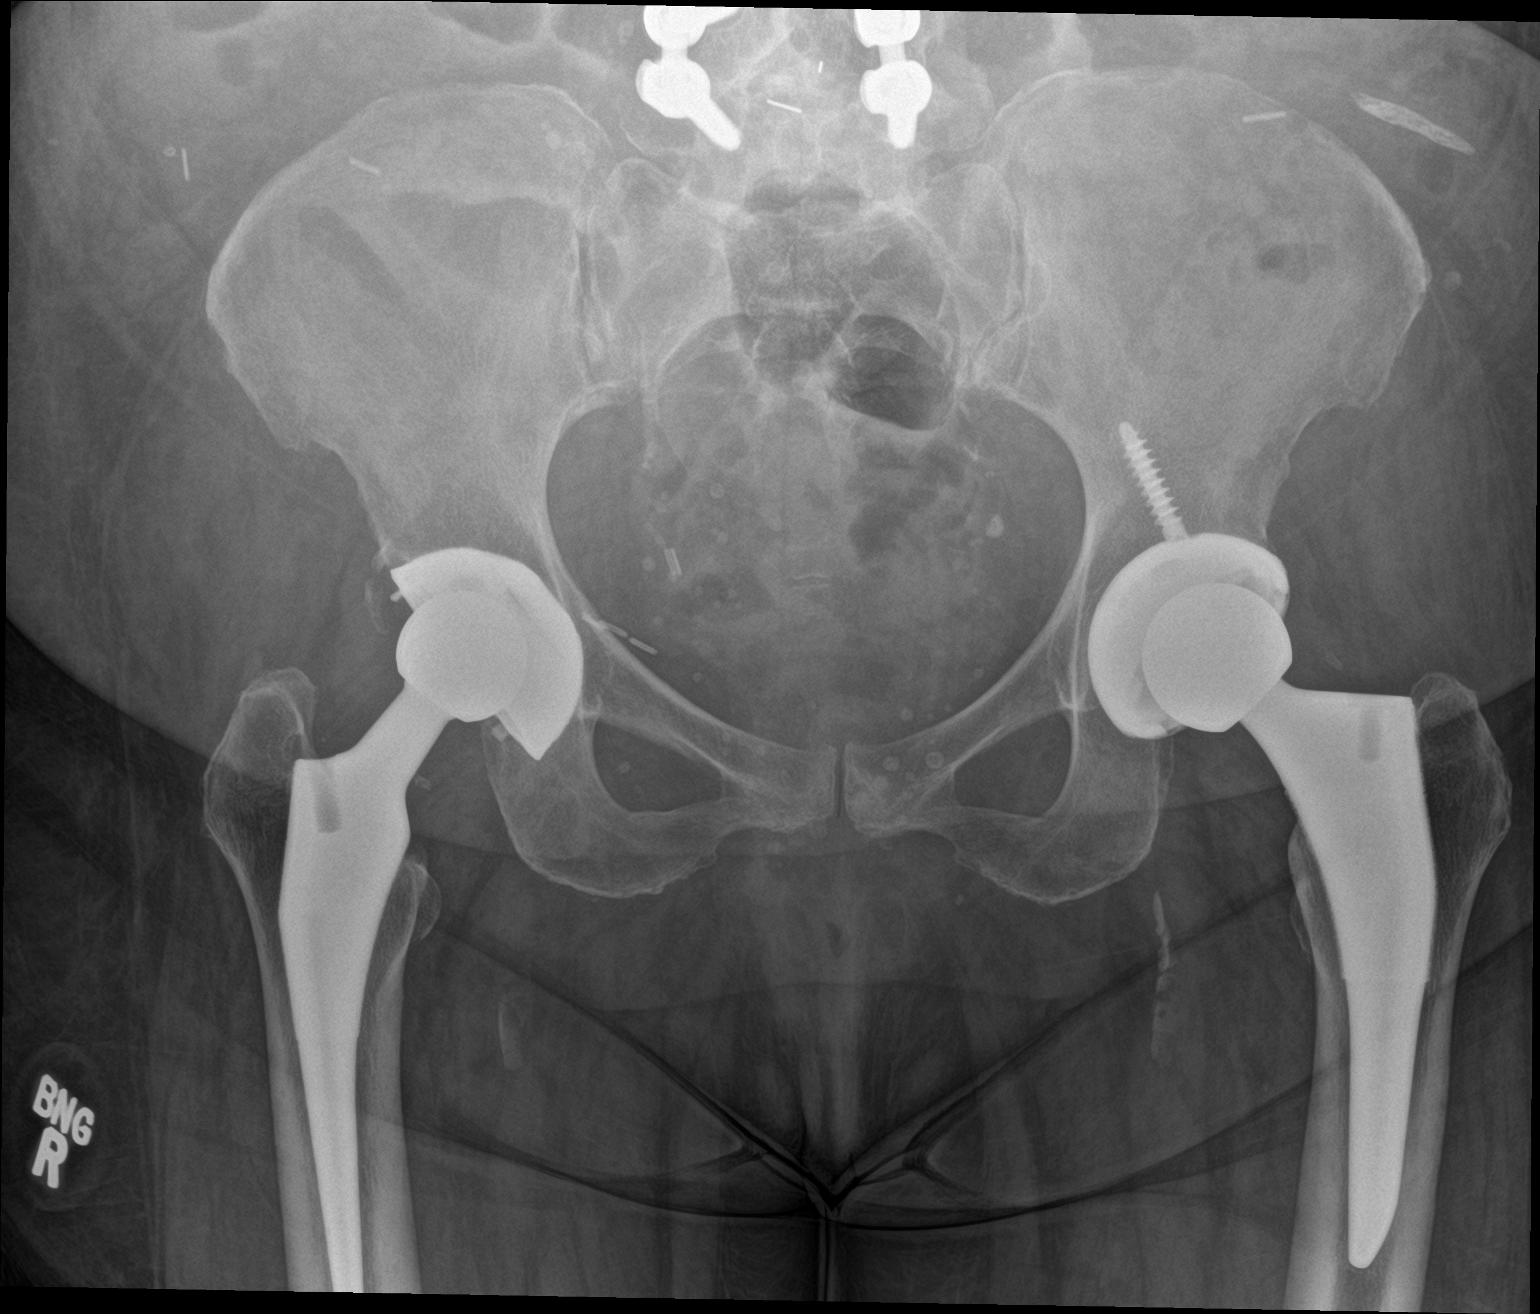

[1 of 1 positions shown; findings below may reference images not displayed]

FINDINGS: No evidence of pelvic ring fracture or diastasis. No evidence of
focal bone lesion. There are lower lumbar postsurgical changes which
are visualized on dedicated lumbar spine radiography.

Bilateral total hip arthroplasty. Left prosthesis is visualized
entirely; the tip of the femoral stem is not seen on the right.
There is asymmetric spacing of the femoral head on the right
compatible with asymmetric linear wear.
IMPRESSION: 1. No acute finding.
2. Bilateral total hip arthroplasty with asymmetric linear wear on
the right.

## 2017-01-26 IMAGING — DX DG LUMBAR SPINE COMPLETE 4+V
5 series · 5 of 5 positions shown · non-contrast
Comparison: None.

CLINICAL DATA: Back pain.  Radiation down left lower extremity.

EXAM:
LUMBAR SPINE - COMPLETE 4+ VIEW

[l-spine ap]
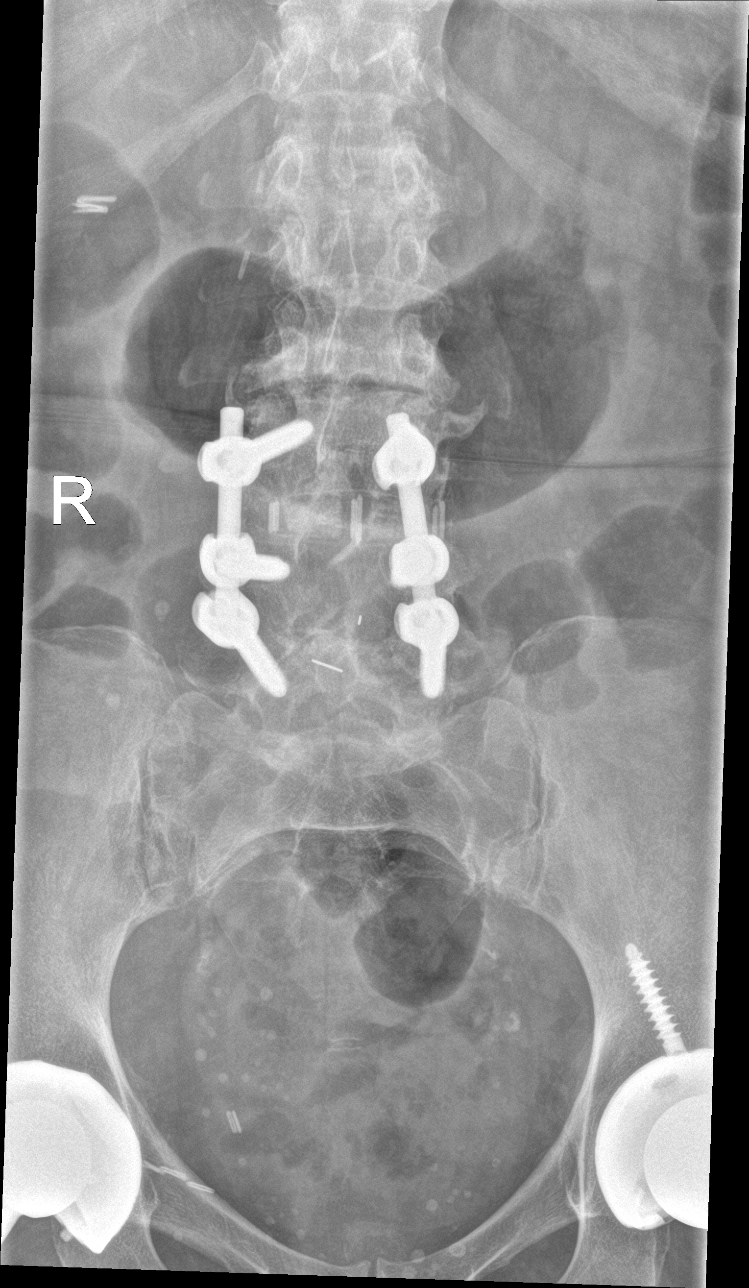

[l-spine obl (1 of 2)]
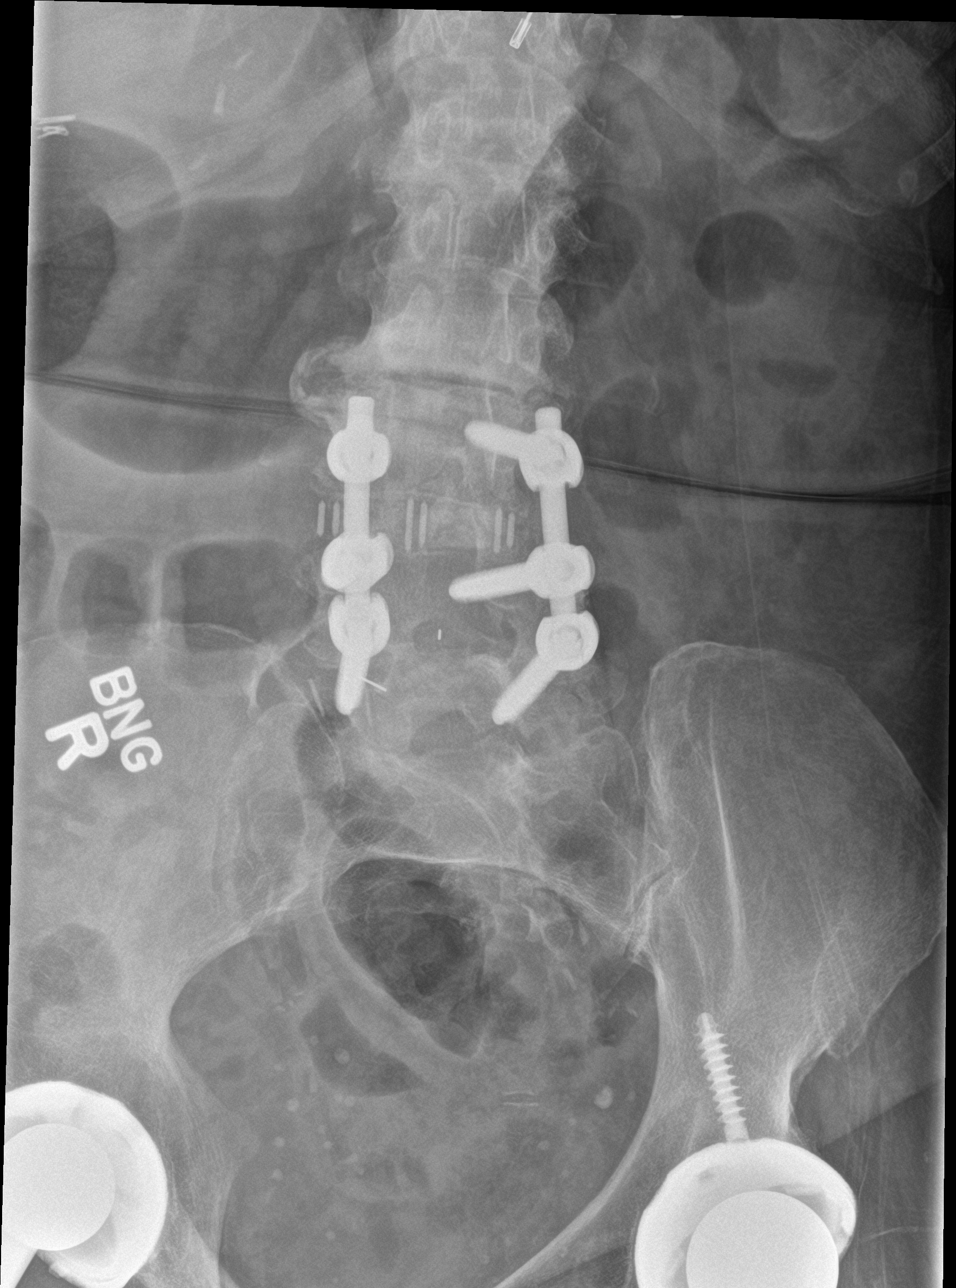

[l-spine obl (2 of 2)]
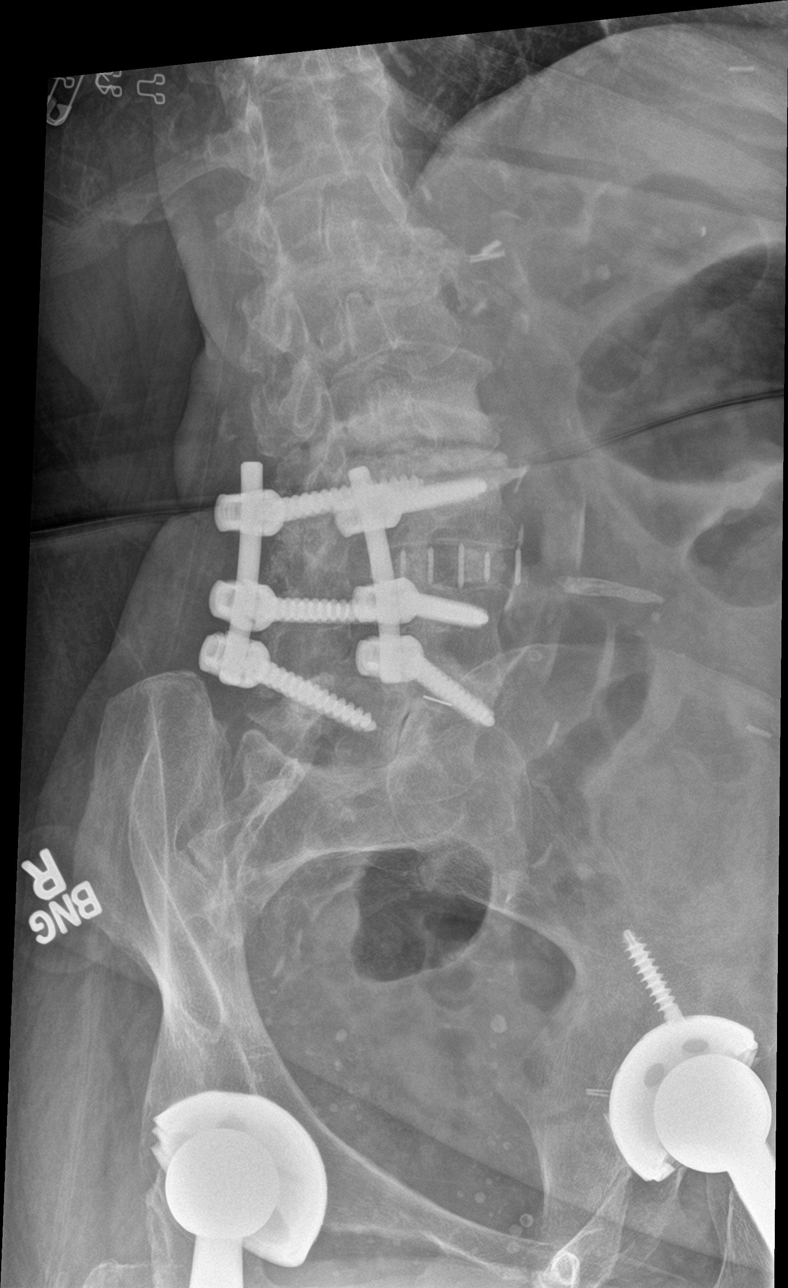

[l-spine lat]
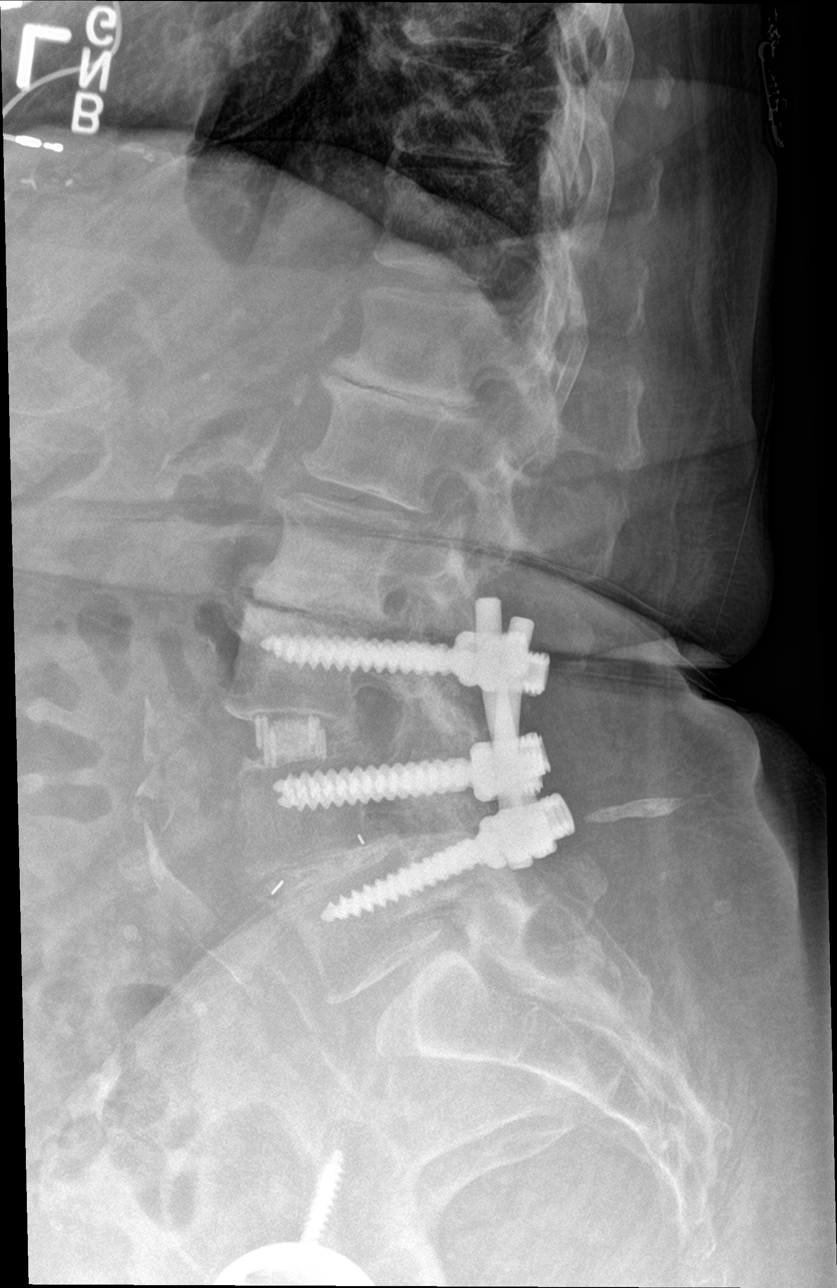

[l-spine spot]
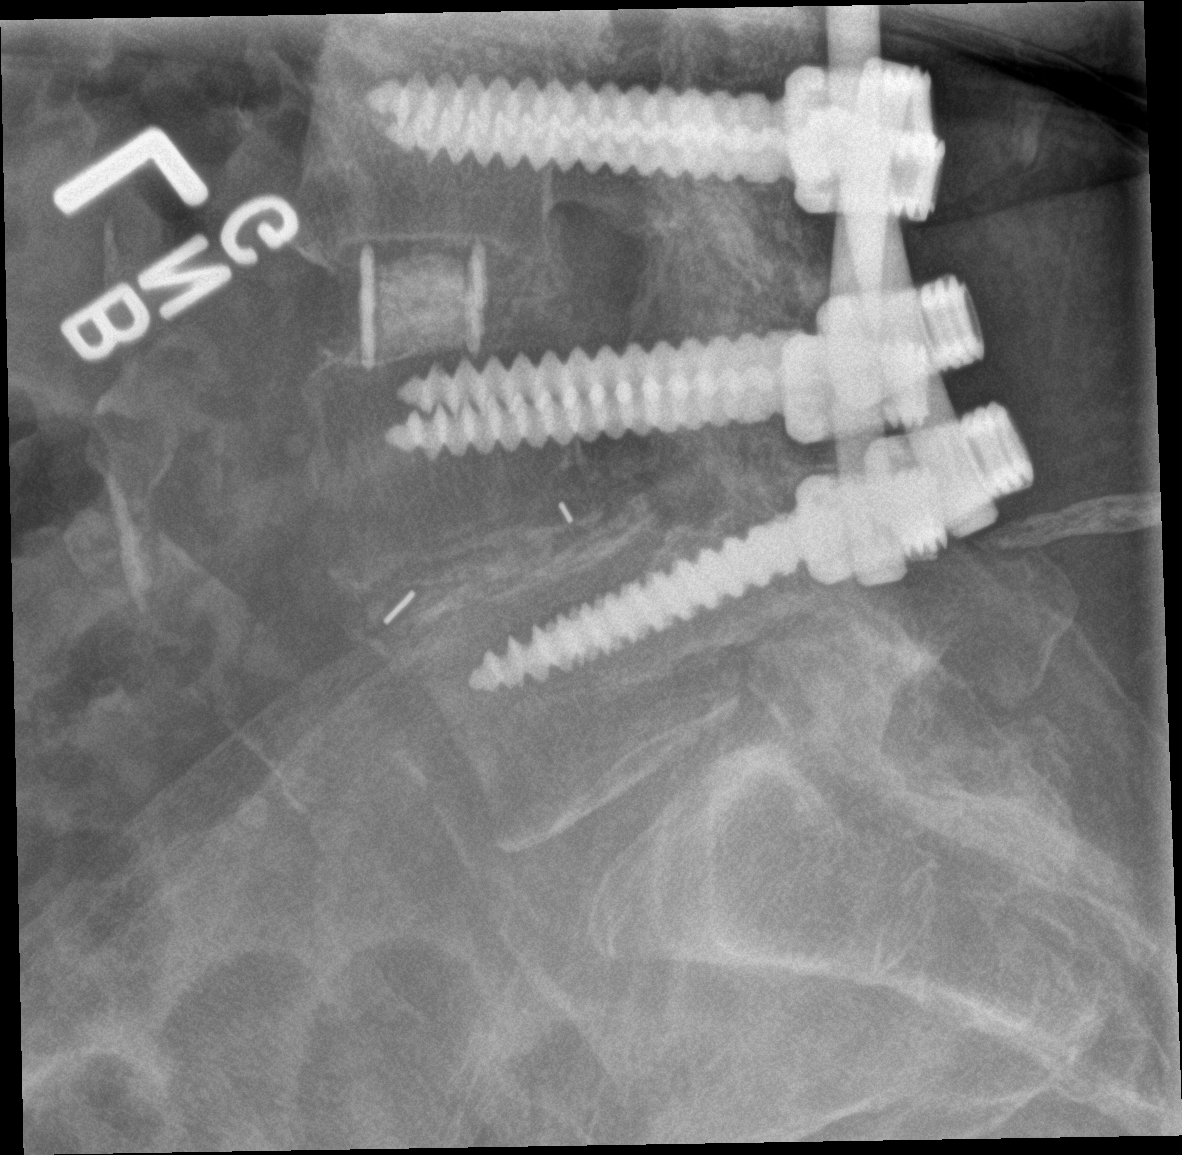

[5 of 5 positions shown; findings below may reference images not displayed]

FINDINGS: Paraspinal soft tissues are normal. Diffuse degenerative change. No
acute abnormality. Prior L3 through L5 posterior and interbody
fusion. Good anatomic alignment. Aortoiliac atherosclerotic vascular
calcification. Bilateral hip replacements. Cholecystectomy.
IMPRESSION: 1. Diffuse severe multilevel degenerative change. L3 through L5
posterior interbody fusion. Good anatomic alignment. No acute
abnormality.

2.  Bilateral total hip replacements.

## 2017-02-16 DIAGNOSIS — I25119 Atherosclerotic heart disease of native coronary artery with unspecified angina pectoris: Secondary | ICD-10-CM | POA: Diagnosis not present

## 2017-02-16 DIAGNOSIS — I10 Essential (primary) hypertension: Secondary | ICD-10-CM | POA: Diagnosis not present

## 2017-02-16 DIAGNOSIS — E1121 Type 2 diabetes mellitus with diabetic nephropathy: Secondary | ICD-10-CM | POA: Diagnosis not present

## 2017-02-16 DIAGNOSIS — M545 Low back pain: Secondary | ICD-10-CM | POA: Diagnosis not present

## 2017-03-21 DIAGNOSIS — M545 Low back pain: Secondary | ICD-10-CM | POA: Diagnosis not present

## 2017-03-21 DIAGNOSIS — I25119 Atherosclerotic heart disease of native coronary artery with unspecified angina pectoris: Secondary | ICD-10-CM | POA: Diagnosis not present

## 2017-03-21 DIAGNOSIS — M25552 Pain in left hip: Secondary | ICD-10-CM | POA: Diagnosis not present

## 2017-03-21 DIAGNOSIS — E1121 Type 2 diabetes mellitus with diabetic nephropathy: Secondary | ICD-10-CM | POA: Diagnosis not present

## 2017-03-21 DIAGNOSIS — I10 Essential (primary) hypertension: Secondary | ICD-10-CM | POA: Diagnosis not present

## 2017-03-31 ENCOUNTER — Other Ambulatory Visit (HOSPITAL_COMMUNITY): Payer: Self-pay | Admitting: Pulmonary Disease

## 2017-03-31 DIAGNOSIS — M5410 Radiculopathy, site unspecified: Secondary | ICD-10-CM

## 2017-04-02 DIAGNOSIS — R079 Chest pain, unspecified: Secondary | ICD-10-CM | POA: Diagnosis not present

## 2017-04-10 DIAGNOSIS — H5200 Hypermetropia, unspecified eye: Secondary | ICD-10-CM | POA: Diagnosis not present

## 2017-04-10 DIAGNOSIS — Z0100 Encounter for examination of eyes and vision without abnormal findings: Secondary | ICD-10-CM | POA: Diagnosis not present

## 2017-04-14 ENCOUNTER — Ambulatory Visit (HOSPITAL_COMMUNITY): Payer: Medicare HMO

## 2017-04-19 ENCOUNTER — Encounter (HOSPITAL_COMMUNITY): Payer: Self-pay

## 2017-04-19 ENCOUNTER — Ambulatory Visit (HOSPITAL_COMMUNITY): Payer: Medicare HMO

## 2017-04-25 DIAGNOSIS — I129 Hypertensive chronic kidney disease with stage 1 through stage 4 chronic kidney disease, or unspecified chronic kidney disease: Secondary | ICD-10-CM | POA: Diagnosis not present

## 2017-04-25 DIAGNOSIS — I110 Hypertensive heart disease with heart failure: Secondary | ICD-10-CM | POA: Diagnosis not present

## 2017-04-25 DIAGNOSIS — I25119 Atherosclerotic heart disease of native coronary artery with unspecified angina pectoris: Secondary | ICD-10-CM | POA: Diagnosis not present

## 2017-04-25 DIAGNOSIS — E1121 Type 2 diabetes mellitus with diabetic nephropathy: Secondary | ICD-10-CM | POA: Diagnosis not present

## 2017-05-11 ENCOUNTER — Other Ambulatory Visit (HOSPITAL_COMMUNITY): Payer: Self-pay | Admitting: Pulmonary Disease

## 2017-05-11 ENCOUNTER — Ambulatory Visit (HOSPITAL_COMMUNITY)
Admission: RE | Admit: 2017-05-11 | Discharge: 2017-05-11 | Disposition: A | Discharge status: Home / Self Care | Payer: Medicare HMO | Source: Ambulatory Visit | Attending: Pulmonary Disease | Admitting: Pulmonary Disease

## 2017-05-11 DIAGNOSIS — M25572 Pain in left ankle and joints of left foot: Secondary | ICD-10-CM

## 2017-05-11 DIAGNOSIS — M79672 Pain in left foot: Secondary | ICD-10-CM | POA: Insufficient documentation

## 2017-05-11 DIAGNOSIS — M7989 Other specified soft tissue disorders: Secondary | ICD-10-CM | POA: Diagnosis not present

## 2017-05-11 DIAGNOSIS — M159 Polyosteoarthritis, unspecified: Secondary | ICD-10-CM | POA: Diagnosis not present

## 2017-05-11 DIAGNOSIS — E1121 Type 2 diabetes mellitus with diabetic nephropathy: Secondary | ICD-10-CM | POA: Diagnosis not present

## 2017-06-02 DIAGNOSIS — H25812 Combined forms of age-related cataract, left eye: Secondary | ICD-10-CM | POA: Diagnosis not present

## 2017-06-20 DIAGNOSIS — H25812 Combined forms of age-related cataract, left eye: Secondary | ICD-10-CM | POA: Diagnosis not present

## 2017-06-26 DIAGNOSIS — R300 Dysuria: Secondary | ICD-10-CM | POA: Diagnosis not present

## 2017-06-26 DIAGNOSIS — I509 Heart failure, unspecified: Secondary | ICD-10-CM | POA: Diagnosis not present

## 2017-06-26 DIAGNOSIS — E1121 Type 2 diabetes mellitus with diabetic nephropathy: Secondary | ICD-10-CM | POA: Diagnosis not present

## 2017-06-26 DIAGNOSIS — I25119 Atherosclerotic heart disease of native coronary artery with unspecified angina pectoris: Secondary | ICD-10-CM | POA: Diagnosis not present

## 2017-06-26 DIAGNOSIS — M545 Low back pain: Secondary | ICD-10-CM | POA: Diagnosis not present

## 2017-07-10 DIAGNOSIS — E785 Hyperlipidemia, unspecified: Secondary | ICD-10-CM | POA: Diagnosis not present

## 2017-07-10 DIAGNOSIS — N183 Chronic kidney disease, stage 3 (moderate): Secondary | ICD-10-CM | POA: Diagnosis not present

## 2017-07-10 DIAGNOSIS — E1122 Type 2 diabetes mellitus with diabetic chronic kidney disease: Secondary | ICD-10-CM | POA: Diagnosis not present

## 2017-07-10 DIAGNOSIS — I2510 Atherosclerotic heart disease of native coronary artery without angina pectoris: Secondary | ICD-10-CM | POA: Diagnosis not present

## 2017-07-10 DIAGNOSIS — G894 Chronic pain syndrome: Secondary | ICD-10-CM | POA: Diagnosis not present

## 2017-07-10 DIAGNOSIS — H25812 Combined forms of age-related cataract, left eye: Secondary | ICD-10-CM | POA: Diagnosis not present

## 2017-07-10 DIAGNOSIS — E1136 Type 2 diabetes mellitus with diabetic cataract: Secondary | ICD-10-CM | POA: Diagnosis not present

## 2017-07-10 DIAGNOSIS — I509 Heart failure, unspecified: Secondary | ICD-10-CM | POA: Diagnosis not present

## 2017-07-10 DIAGNOSIS — I130 Hypertensive heart and chronic kidney disease with heart failure and stage 1 through stage 4 chronic kidney disease, or unspecified chronic kidney disease: Secondary | ICD-10-CM | POA: Diagnosis not present

## 2017-07-10 DIAGNOSIS — M159 Polyosteoarthritis, unspecified: Secondary | ICD-10-CM | POA: Diagnosis not present

## 2017-07-10 DIAGNOSIS — I10 Essential (primary) hypertension: Secondary | ICD-10-CM | POA: Diagnosis not present

## 2017-08-22 DIAGNOSIS — H3581 Retinal edema: Secondary | ICD-10-CM | POA: Diagnosis not present

## 2017-09-01 ENCOUNTER — Encounter (HOSPITAL_COMMUNITY): Payer: Self-pay | Admitting: *Deleted

## 2017-09-01 ENCOUNTER — Emergency Department (HOSPITAL_COMMUNITY): Payer: Medicare HMO

## 2017-09-01 ENCOUNTER — Emergency Department (HOSPITAL_COMMUNITY)
Admission: EM | Admit: 2017-09-01 | Discharge: 2017-09-01 | Disposition: A | Discharge status: Home / Self Care | Payer: Medicare HMO | Attending: Emergency Medicine | Admitting: Emergency Medicine

## 2017-09-01 DIAGNOSIS — R296 Repeated falls: Secondary | ICD-10-CM | POA: Diagnosis not present

## 2017-09-01 DIAGNOSIS — M545 Low back pain: Secondary | ICD-10-CM | POA: Diagnosis present

## 2017-09-01 DIAGNOSIS — Z950 Presence of cardiac pacemaker: Secondary | ICD-10-CM | POA: Insufficient documentation

## 2017-09-01 DIAGNOSIS — Z7984 Long term (current) use of oral hypoglycemic drugs: Secondary | ICD-10-CM | POA: Diagnosis not present

## 2017-09-01 DIAGNOSIS — Z96653 Presence of artificial knee joint, bilateral: Secondary | ICD-10-CM | POA: Insufficient documentation

## 2017-09-01 DIAGNOSIS — I2510 Atherosclerotic heart disease of native coronary artery without angina pectoris: Secondary | ICD-10-CM | POA: Diagnosis not present

## 2017-09-01 DIAGNOSIS — M5432 Sciatica, left side: Secondary | ICD-10-CM | POA: Diagnosis not present

## 2017-09-01 DIAGNOSIS — I130 Hypertensive heart and chronic kidney disease with heart failure and stage 1 through stage 4 chronic kidney disease, or unspecified chronic kidney disease: Secondary | ICD-10-CM | POA: Diagnosis not present

## 2017-09-01 DIAGNOSIS — Z7901 Long term (current) use of anticoagulants: Secondary | ICD-10-CM | POA: Insufficient documentation

## 2017-09-01 DIAGNOSIS — J45909 Unspecified asthma, uncomplicated: Secondary | ICD-10-CM | POA: Diagnosis not present

## 2017-09-01 DIAGNOSIS — N183 Chronic kidney disease, stage 3 (moderate): Secondary | ICD-10-CM | POA: Insufficient documentation

## 2017-09-01 DIAGNOSIS — F17220 Nicotine dependence, chewing tobacco, uncomplicated: Secondary | ICD-10-CM | POA: Insufficient documentation

## 2017-09-01 DIAGNOSIS — E1122 Type 2 diabetes mellitus with diabetic chronic kidney disease: Secondary | ICD-10-CM | POA: Diagnosis not present

## 2017-09-01 DIAGNOSIS — Z96643 Presence of artificial hip joint, bilateral: Secondary | ICD-10-CM | POA: Insufficient documentation

## 2017-09-01 DIAGNOSIS — J449 Chronic obstructive pulmonary disease, unspecified: Secondary | ICD-10-CM | POA: Diagnosis not present

## 2017-09-01 DIAGNOSIS — I5032 Chronic diastolic (congestive) heart failure: Secondary | ICD-10-CM | POA: Insufficient documentation

## 2017-09-01 DIAGNOSIS — Z951 Presence of aortocoronary bypass graft: Secondary | ICD-10-CM | POA: Diagnosis not present

## 2017-09-01 DIAGNOSIS — Z91010 Allergy to peanuts: Secondary | ICD-10-CM | POA: Insufficient documentation

## 2017-09-01 DIAGNOSIS — Z79899 Other long term (current) drug therapy: Secondary | ICD-10-CM | POA: Insufficient documentation

## 2017-09-01 DIAGNOSIS — M5442 Lumbago with sciatica, left side: Secondary | ICD-10-CM | POA: Diagnosis not present

## 2017-09-01 DIAGNOSIS — M4326 Fusion of spine, lumbar region: Secondary | ICD-10-CM | POA: Diagnosis not present

## 2017-09-01 LAB — BASIC METABOLIC PANEL
ANION GAP: 9 (ref 5–15)
BUN: 25 mg/dL — ABNORMAL HIGH (ref 6–20)
CALCIUM: 9.4 mg/dL (ref 8.9–10.3)
CHLORIDE: 108 mmol/L (ref 101–111)
CO2: 22 mmol/L (ref 22–32)
Creatinine, Ser: 1.4 mg/dL — ABNORMAL HIGH (ref 0.44–1.00)
GFR calc non Af Amer: 35 mL/min — ABNORMAL LOW (ref >60)
GFR, EST AFRICAN AMERICAN: 41 mL/min — AB (ref >60)
GLUCOSE: 100 mg/dL — AB (ref 65–99)
POTASSIUM: 4.1 mmol/L (ref 3.5–5.1)
Sodium: 139 mmol/L (ref 135–145)

## 2017-09-01 LAB — URINALYSIS, ROUTINE W REFLEX MICROSCOPIC
Bilirubin Urine: NEGATIVE
GLUCOSE, UA: NEGATIVE mg/dL
Hgb urine dipstick: NEGATIVE
Ketones, ur: NEGATIVE mg/dL
LEUKOCYTES UA: NEGATIVE
NITRITE: NEGATIVE
PH: 5 (ref 5.0–8.0)
Protein, ur: NEGATIVE mg/dL
SPECIFIC GRAVITY, URINE: 1.005 (ref 1.005–1.030)

## 2017-09-01 LAB — CBC WITH DIFFERENTIAL/PLATELET
BASOS PCT: 0 %
Basophils Absolute: 0 10*3/uL (ref 0.0–0.1)
Eosinophils Absolute: 0.2 10*3/uL (ref 0.0–0.7)
Eosinophils Relative: 3 %
HCT: 30.9 % — ABNORMAL LOW (ref 36.0–46.0)
HEMOGLOBIN: 9.3 g/dL — AB (ref 12.0–15.0)
LYMPHS PCT: 27 %
Lymphs Abs: 1.9 10*3/uL (ref 0.7–4.0)
MCH: 22.6 pg — AB (ref 26.0–34.0)
MCHC: 30.1 g/dL (ref 30.0–36.0)
MCV: 75 fL — ABNORMAL LOW (ref 78.0–100.0)
MONO ABS: 0.5 10*3/uL (ref 0.1–1.0)
MONOS PCT: 7 %
NEUTROS ABS: 4.3 10*3/uL (ref 1.7–7.7)
NEUTROS PCT: 63 %
Platelets: 253 10*3/uL (ref 150–400)
RBC: 4.12 MIL/uL (ref 3.87–5.11)
RDW: 18.2 % — ABNORMAL HIGH (ref 11.5–15.5)
WBC: 6.8 10*3/uL (ref 4.0–10.5)

## 2017-09-01 MED ORDER — ACETAMINOPHEN 325 MG PO TABS
650.0000 mg | ORAL_TABLET | Freq: Once | ORAL | Status: AC
  Administered 2017-09-01: 650 mg via ORAL
  Filled 2017-09-01: qty 2

## 2017-09-01 NOTE — ED Triage Notes (Signed)
Pt with back pain that radiates down left hip and left leg, ongoing for a week.

## 2017-09-01 NOTE — ED Notes (Signed)
Pt gone to Xray 

## 2017-09-01 NOTE — ED Notes (Signed)
Pt denies any urinary symptoms.

## 2017-09-01 NOTE — Discharge Instructions (Signed)
Alternate ice and heat to your back and hip.  Follow-up with your doctor next week.  Give her tylenol 650 mg every 4 hours as needed.

## 2017-09-01 NOTE — ED Notes (Signed)
Pt states understanding of care given and follow up instructions.  Pt a/o ambulated from ED with a steady gait

## 2017-09-03 NOTE — ED Provider Notes (Signed)
[facility] Provider Note   CSN: [id number] Arrival date & time: 09/01/17  1154     History   Chief Complaint Chief Complaint  Patient presents with  . Back Pain    HPI [name] is a 76 y.o. female.  HPI   [name] is a 76 y.o. female with low back pain, anti-coagulated with Eliquis,  presents to the Emergency Department complaining of recurrent low back and left hip pain.  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  Pain worse for one week.  Describes a recurrent pain from her low back that radiates into the left leg.  Pain associated with weight bearing and bending, improves at rest.  No injury.  Denies swelling, numbness or weakness of the lower extremity, abdominal pain, urine or bowel changes, fever or chills.      Past Medical History:  Diagnosis Date  . Anemia   . Anginal pain (HCC)    "all the time, but not heart related"  . Arthritis    "legs and back" (09/30/2013)  . Asthma    years ago  . 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 Blurred vision, bilateral    sometimes double vision  . CAD (coronary artery disease)    multivessel, CABG 6/10.  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  Myoview (11/20/11) showed no ischemia & EF 62%  . Chronic lower back pain   . COPD (chronic obstructive pulmonary disease) (HCC)    severe lung disease by PFTs 6/12  . DDD (degenerative disc disease)   . Deep vein thrombosis, upper right extremity (HCC)    post-PPM  . Dehiscence of closure of sternum or sternotomy 07/2009   sternal infection; required flap closure  . Diabetes mellitus, type II (HCC)   . Gastroesophageal reflux disease    not much  . H/O hiatal hernia   . History of kidney stones   . Hyperlipidemia   . Hypertension   . Migraines    "years ago" (09/30/2013)  . Obesity   . Orthopnea    sometimes  . Pacemaker   . PUD (peptic ulcer disease)   . Seizures (HCC)    "long time ago; don't remember what they were  related to" (09/30/2013)none recent  . Sleep apnea    denies CPAP use on 09/30/2013  . Stroke [facility])    years ago, "residual on left back"  . Urge incontinence of urine     Patient Active Problem List   Diagnosis Date Noted  . Concussion with no loss of consciousness 09/28/2016  . AKI (acute kidney injury) (HCC) 09/25/2016  . Acute kidney injury (nontraumatic) (HCC) 09/25/2016  . Fall 09/25/2016  . Acute kidney injury (HCC) 09/25/2016  . Chest pain 08/29/2016  . Atrial fibrillation (HCC) 02/06/2015  . Encephalopathy, metabolic 10/12/2014  . Intractable nausea and vomiting 10/09/2014  . Epigastric abdominal pain 10/09/2014  . Expected blood loss anemia 12/04/2013  . S/P left THA, AA 12/03/2013  . Protein-calorie malnutrition, severe (HCC) 10/02/2013  . Precordial pain 09/30/2013  . Failure to thrive 07/26/2013  . Anemia of chronic disease 07/13/2013  . Altered mental state 07/13/2013  . NSTEMI (non-ST elevated myocardial infarction) (HCC) 07/01/2013  . Acute delirium 06/29/2013  . Fecal impaction (HCC) 06/29/2013  . Total knee replacement status 05/22/2013  . H/O total hip arthroplasty 05/22/2013  . Spinal stenosis, lumbar region, with neurogenic claudication 05/22/2013  . Osteoarthritis of left hip  05/22/2013  . Noninfectious gastroenteritis and colitis 03/05/2013  . Dehydration 03/05/2013  . Family history of colon cancer 02/15/2013  . PUD (peptic ulcer disease) 02/15/2013  . Diabetes mellitus, type II (HCC)   . Hypertension   . Arteriosclerotic cardiovascular disease (ASCVD)   . Gastroesophageal reflux disease   . Hyperlipidemia   . Deep vein thrombosis, upper right extremity (HCC)   . Obesity   . AV block   . COPD (chronic obstructive pulmonary disease) (HCC)   . Sleep apnea   . Chronic kidney disease (CKD), stage III (moderate)   . Seizure disorder (HCC)   . Anemia, normocytic normochromic 12/15/2011  . Chronic diastolic heart failure (HCC) 11/18/2011  .  PACEMAKER, PERMANENT 05/13/2009  . 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 GASTRIC ULCER 05/12/2009  . Gastroparesis 05/12/2009  . DIVERTICULAR DISEASE 05/12/2009    Past Surgical History:  Procedure Laterality Date  . ABDOMINAL HYSTERECTOMY     partial  . APPENDECTOMY    . BACK SURGERY     x3 total  . CESAREAN SECTION  ~1980  . CHOLECYSTECTOMY    . COLONOSCOPY  04/27/2005   hemorrhoids, diverticula  . COLONOSCOPY WITH ESOPHAGOGASTRODUODENOSCOPY (EGD) N/A 02/20/2013   RMR: pancolonic diverticulosis and internal hemorrhoids. EGD with small hiatal hernia, healed gastric ulcer  . CORONARY ARTERY BYPASS GRAFT  05/1999   - LIMA to LAD, SVG to OM, SVG to PDA  . ESOPHAGOGASTRODUODENOSCOPY     with [facility] dilation,  biopsy, and disruption Schatzki's ring  . ESOPHAGOGASTRODUODENOSCOPY  09/10/2012   RMR: Noncritical Schatzki's ring. Diffuse gastric erosions and an  area of partially healing ulceration-status post biopsy/ Small hiatal hernia. Bx reactive gastropathy.  . INSERT / REPLACE / REMOVE PACEMAKER  1993  . JOINT REPLACEMENT    . LUMBAR DISC SURGERY    . PACEMAKER PLACEMENT  1993   Status post DDD pacemaker implantation in 1993, with Medtronic Kappa generator change in 2003 for  second-degree AV block, most recent gen change by Fawn Kirk 04/14/11  . PORTACATH PLACEMENT Left 07/17/2013   Procedure: INSERTION PORT-A-CATH-left subclavian;  Surgeon: Fabio Bering, MD;  Location: [facility] ORS;  Service: General;  Laterality: Left;  . POSTERIOR LUMBAR FUSION    . RECONSTRUCTIVE REPAIR STERNAL     for infection s/p CABG 8/10  . REPLACEMENT TOTAL KNEE Bilateral   . THYROIDECTOMY    . TOTAL HIP ARTHROPLASTY Right   . TOTAL HIP ARTHROPLASTY Left 12/03/2013   Procedure: LEFT TOTAL HIP ARTHROPLASTY ANTERIOR APPROACH;  Surgeon: Shelda Pal, MD;  Location: [facility];  Service: Orthopedics;  Laterality: Left;  . TUBAL LIGATION    . WRIST SURGERY Left    "tumor taken off" (09/30/2013)    OB History    Gravida Para  Term Preterm AB Living   SAB TAB Ectopic Multiple Live Births                   Home Medications    Prior to Admission medications   Medication Sig Start Date End Date Taking? Authorizing Provider  acetaminophen (TYLENOL) 325 MG tablet Take 325-650 mg by mouth every 6 (six) hours as needed for mild pain, moderate pain or headache.     [provider]  apixaban (ELIQUIS) 5 MG TABS tablet Take 5 mg by mouth 2 (two) times daily.     [provider]  atorvastatin (LIPITOR) 40 MG tablet Take 40 mg by mouth  at bedtime.    [provider]  benazepril (LOTENSIN) 40 MG tablet Take 40 mg by mouth daily.     [provider]  docusate sodium (COLACE) 100 MG capsule Take 100 mg by mouth daily.    [provider]  ferrous sulfate 325 (65 FE) MG tablet Take 325 mg by mouth daily with breakfast.    [provider]  ibuprofen (ADVIL,MOTRIN) 200 MG tablet Take 600 mg by mouth every 6 (six) hours as needed for headache, mild pain or moderate pain.    [provider]  isosorbide mononitrate (IMDUR) 30 MG 24 hr tablet Take 1 tablet (30 mg total) by mouth daily. 08/31/16   Avon Gully, MD  ketorolac (ACULAR) 0.5 % ophthalmic solution Place 1 drop into the right eye 3 (three) times daily.    [provider]  levETIRAcetam (KEPPRA) 500 MG tablet Take 500 mg by mouth 2 (two) times daily.    [provider]  lubiprostone (AMITIZA) 24 MCG capsule Take 24 mcg by mouth 2 (two) times daily with a meal.    [provider]  Melatonin 3 MG TABS Take 3 mg by mouth at bedtime as needed (for sleep).    [provider]  metFORMIN (GLUCOPHAGE) 500 MG tablet Take 500 mg by mouth 2 (two) times daily with a meal.     [provider]  metoprolol tartrate (LOPRESSOR) 25 MG tablet Take 25 mg by mouth 2 (two) times daily.     [provider]  nitroGLYCERIN (NITROSTAT) 0.4 MG SL tablet Place 1 tablet (0.4  mg total) under the tongue every 5 (five) minutes as needed for chest pain. 05/20/14   Antoine Poche, MD  omega-3 acid ethyl esters (LOVAZA) 1 G capsule Take 2 g by mouth 2 (two) times daily.     [provider]  ondansetron (ZOFRAN-ODT) 4 MG disintegrating tablet Take 4 mg by mouth every 8 (eight) hours as needed for nausea or vomiting.    [provider]  pantoprazole (PROTONIX) 40 MG tablet Take 1 tablet (40 mg total) by mouth daily. 12/03/13   Gelene Mink, NP  potassium chloride (K-DUR,KLOR-CON) 10 MEQ tablet Take 10 mEq by mouth daily.    [provider]  traZODone (DESYREL) 50 MG tablet Take 50 mg by mouth at bedtime.    [provider]    Family History Family History  Problem Relation Age of Onset  . Heart disease Mother        MI  . Colon cancer Father        age 41  . Diabetes Unknown   . Coronary artery disease Unknown        Female <55  . Cancer Unknown     Social History Social History  Substance Use Topics  . Smoking status: Never Smoker  . Smokeless tobacco: Former Neurosurgeon    Types: Snuff     Comment: 09/30/2013 "stopped snuff a long time ago"  . Alcohol use No     Allergies   Codeine; Food; Oxycodone hcl; Peanut-containing drug products; and Reglan [metoclopramide]   Review of Systems Review of Systems  Constitutional: Negative for fever.  Respiratory: Negative for chest tightness and shortness of breath.   Cardiovascular: Negative for chest pain.  Gastrointestinal: Negative for abdominal pain, diarrhea and vomiting.  Genitourinary: Negative for decreased urine volume, difficulty urinating, dysuria, flank pain and hematuria.  Musculoskeletal: Positive for back pain. Negative for joint swelling.  Skin: Negative  for rash.  Neurological: Negative for syncope, weakness and numbness.  All other systems reviewed and are negative.    Physical Exam Updated Vital Signs BP (!) 184/96 (BP Location: Left Arm)   Pulse 86    Temp 97.8 F (36.6 C) (Oral)   Resp 18   Ht  (1.575 m)   Wt 81.6 kg (180 lb)   SpO2 99%   BMI 32.92 kg/m   Physical Exam  Constitutional: She is oriented to person, place, and time. She appears well-developed and well-nourished. No distress.  HENT:  Head: Normocephalic and atraumatic.  Mouth/Throat: Oropharynx is clear and moist.  Neck: Normal range of motion. Neck supple.  Cardiovascular: Normal rate, regular rhythm and intact distal pulses.   Pulmonary/Chest: Effort normal and breath sounds normal. No respiratory distress.  Abdominal: Soft. She exhibits no distension and no mass. There is no tenderness. There is no guarding.  Musculoskeletal: She exhibits tenderness. She exhibits no edema.       Lumbar back: She exhibits tenderness and pain. She exhibits normal range of motion, no swelling, no deformity, no laceration and normal pulse.  ttp of the left lumbar paraspinal muscles and SI joint.  No spinal tenderness.  Positive SLR on left. Pt has 5/5 strength against resistance of bilateral lower extremities.  No lower leg pain, erythema or edema.   Neurological: She is alert and oriented to person, place, and time. She has normal strength. No sensory deficit. She exhibits normal muscle tone. Coordination and gait normal.  Reflex Scores:      Patellar reflexes are 2+ on the right side and 2+ on the left side.      Achilles reflexes are 2+ on the right side and 2+ on the left side. Skin: Skin is warm and dry. Capillary refill takes less than 2 seconds. No rash noted.  Psychiatric: She has a normal mood and affect.  Nursing note and vitals reviewed.    ED Treatments / Results  Labs (all labs ordered are listed, but only abnormal results are displayed) Labs Reviewed  URINALYSIS, ROUTINE W REFLEX MICROSCOPIC - Abnormal; Notable for the following:       Result Value   Color, Urine STRAW (*)    All other components within normal limits  BASIC METABOLIC PANEL - Abnormal; Notable  for the following:    Glucose, Bld 100 (*)    BUN 25 (*)    Creatinine, Ser 1.40 (*)    GFR calc non Af Amer 35 (*)    GFR calc Af Amer 41 (*)    All other components within normal limits  CBC WITH DIFFERENTIAL/PLATELET - Abnormal; Notable for the following:    Hemoglobin 9.3 (*)    HCT 30.9 (*)    MCV 75.0 (*)    MCH 22.6 (*)    RDW 18.2 (*)    All other components within normal limits    EKG  EKG Interpretation None       Radiology Dg Lumbar Spine Complete  Result Date: 09/01/2017 CLINICAL DATA:  Left hip pain . EXAM: LUMBAR SPINE - COMPLETE 4+ VIEW COMPARISON:  Lumbar spine 07/31/2015 . FINDINGS: Lumbar spine numbered as per prior study of 08/26/ 2016. L3-L4 and L4-L5 posterior and interbody fusion. Hardware intact. Lumbar spine scoliosis concave right. Bilateral hip degenerative change. Surgical clips right upper quadrant . IMPRESSION: L3-L4 and L4-L5 posterior and interbody fusion. Hardware intact. No acute bony abnormality. Electronically Signed   By: Maisie Fus  Register   On:  09/01/2017 13:40   Dg Hip Unilat W Or Wo Pelvis 2-3 Views Left  Result Date: 09/01/2017 CLINICAL DATA:  Left hip pain.  Low back pain.  Multiple falls . EXAM: DG HIP (WITH OR WITHOUT PELVIS) 2-3V LEFT COMPARISON:  11/15/2016 . FINDINGS: Bilateral hip replacements. Hardware intact. Anatomic alignment. Postsurgical changes left femur. Mild lucency in the distal left femoral shaft noted. This may be related to prior surgery. Left ovary series suggest for further evaluation Prior lumbar spine fusion . No acute bony abnormality identified. Pelvic calcifications consistent phleboliths. Surgical clips in the pelvis . IMPRESSION: 1. Bilateral hip replacements. Hardware intact. Anatomic alignment. Postsurgical changes distal left femur. No acute abnormality . 2. Postsurgical changes distal left femur. Mild lucency in the femoral shaft noted. This may be related to prior surgery. Left femur series suggested for further  evaluation . 3.  Prior lumbar fusion. 4. Peripheral vascular disease . Electronically Signed   By: Maisie Fus  Register   On: 09/01/2017 13:44   Dg Femur Min 2 Views Left  Result Date: 09/01/2017 CLINICAL DATA:  76 y/o female with left hip and leg pain after multiple falls in the past week. Prior left lower extremity surgery. EXAM: LEFT FEMUR 2 VIEWS COMPARISON:  Left hip series and left knee series 12 11/2016. FINDINGS: Left total hip arthroplasty hardware appears stable and intact. Left hemipelvis and proximal left femur appears stable and intact. Left total knee arthroplasty with distal femur intramedullary rod and cannulated screws. The hardware appears stable since 2017 and intact. Osteopenia of the visible tibia and fibula. Stable patella. No acute osseous abnormality identified. Calcified femoral artery and peripheral vascular atherosclerosis. IMPRESSION: 1. Stable postoperative appearance of left total hip and left knee arthroplasty. 2.  No acute osseous abnormality identified. Electronically Signed   By: Odessa Fleming M.D.   On: 09/01/2017 14:31     Procedures Procedures (including critical care time)  Medications Ordered in ED Medications  acetaminophen (TYLENOL) tablet 650 mg (650 mg Oral Given 09/01/17 1310)     Initial Impression / Assessment and Plan / ED Course  I have reviewed the triage vital signs and the nursing notes.  Pertinent labs & imaging results that were available during my care of the patient were reviewed by me and considered in my medical decision making (see chart for details).     Pt with recurrent low back and hip pain.  Non-toxic appearing.  No concerning sx's for PE/DVT.  No concerning sx's for septic joint, cauda equina, spinal abscess.    Pt ambulatory.  Feeling better after tylenol.  Agrees to close f/u with PCP.  Return precautions discussed.   Final Clinical Impressions(s) / ED Diagnoses   Final diagnoses:  Sciatica of left side    New  Prescriptions Discharge Medication List as of 09/01/2017  3:30 PM       Pauline Aus, PA-C 09/03/17 2130    Moe Graca, PA-C 09/03/17 2131    Eber Hong, MD 09/07/17 [phone]

## 2017-09-19 ENCOUNTER — Other Ambulatory Visit: Payer: Self-pay

## 2017-09-19 ENCOUNTER — Encounter (HOSPITAL_COMMUNITY): Payer: Self-pay | Admitting: *Deleted

## 2017-09-19 ENCOUNTER — Emergency Department (HOSPITAL_COMMUNITY): Payer: Medicare HMO

## 2017-09-19 ENCOUNTER — Inpatient Hospital Stay (HOSPITAL_COMMUNITY)
Admission: EM | Admit: 2017-09-19 | Discharge: 2017-09-25 | DRG: 193 | Disposition: A | Discharge status: Home Health | Payer: Medicare HMO | Attending: Pulmonary Disease | Admitting: Pulmonary Disease

## 2017-09-19 DIAGNOSIS — M1990 Unspecified osteoarthritis, unspecified site: Secondary | ICD-10-CM | POA: Diagnosis present

## 2017-09-19 DIAGNOSIS — Z87891 Personal history of nicotine dependence: Secondary | ICD-10-CM | POA: Diagnosis not present

## 2017-09-19 DIAGNOSIS — J181 Lobar pneumonia, unspecified organism: Secondary | ICD-10-CM | POA: Diagnosis not present

## 2017-09-19 DIAGNOSIS — F0390 Unspecified dementia without behavioral disturbance: Secondary | ICD-10-CM | POA: Diagnosis present

## 2017-09-19 DIAGNOSIS — I4430 Unspecified atrioventricular block: Secondary | ICD-10-CM | POA: Diagnosis present

## 2017-09-19 DIAGNOSIS — Z7901 Long term (current) use of anticoagulants: Secondary | ICD-10-CM

## 2017-09-19 DIAGNOSIS — Z79899 Other long term (current) drug therapy: Secondary | ICD-10-CM

## 2017-09-19 DIAGNOSIS — G4730 Sleep apnea, unspecified: Secondary | ICD-10-CM | POA: Diagnosis present

## 2017-09-19 DIAGNOSIS — E43 Unspecified severe protein-calorie malnutrition: Secondary | ICD-10-CM | POA: Diagnosis present

## 2017-09-19 DIAGNOSIS — J440 Chronic obstructive pulmonary disease with acute lower respiratory infection: Secondary | ICD-10-CM | POA: Diagnosis not present

## 2017-09-19 DIAGNOSIS — Z96653 Presence of artificial knee joint, bilateral: Secondary | ICD-10-CM | POA: Diagnosis present

## 2017-09-19 DIAGNOSIS — E1151 Type 2 diabetes mellitus with diabetic peripheral angiopathy without gangrene: Secondary | ICD-10-CM | POA: Diagnosis present

## 2017-09-19 DIAGNOSIS — I10 Essential (primary) hypertension: Secondary | ICD-10-CM | POA: Diagnosis present

## 2017-09-19 DIAGNOSIS — I130 Hypertensive heart and chronic kidney disease with heart failure and stage 1 through stage 4 chronic kidney disease, or unspecified chronic kidney disease: Secondary | ICD-10-CM | POA: Diagnosis not present

## 2017-09-19 DIAGNOSIS — I25810 Atherosclerosis of coronary artery bypass graft(s) without angina pectoris: Secondary | ICD-10-CM | POA: Diagnosis not present

## 2017-09-19 DIAGNOSIS — Z6832 Body mass index (BMI) 32.0-32.9, adult: Secondary | ICD-10-CM

## 2017-09-19 DIAGNOSIS — R410 Disorientation, unspecified: Secondary | ICD-10-CM

## 2017-09-19 DIAGNOSIS — Z950 Presence of cardiac pacemaker: Secondary | ICD-10-CM | POA: Diagnosis not present

## 2017-09-19 DIAGNOSIS — Z7984 Long term (current) use of oral hypoglycemic drugs: Secondary | ICD-10-CM

## 2017-09-19 DIAGNOSIS — Z8711 Personal history of peptic ulcer disease: Secondary | ICD-10-CM

## 2017-09-19 DIAGNOSIS — I4891 Unspecified atrial fibrillation: Secondary | ICD-10-CM

## 2017-09-19 DIAGNOSIS — I5032 Chronic diastolic (congestive) heart failure: Secondary | ICD-10-CM | POA: Diagnosis present

## 2017-09-19 DIAGNOSIS — Z96643 Presence of artificial hip joint, bilateral: Secondary | ICD-10-CM | POA: Diagnosis present

## 2017-09-19 DIAGNOSIS — E1122 Type 2 diabetes mellitus with diabetic chronic kidney disease: Secondary | ICD-10-CM | POA: Diagnosis present

## 2017-09-19 DIAGNOSIS — G9341 Metabolic encephalopathy: Secondary | ICD-10-CM

## 2017-09-19 DIAGNOSIS — Z981 Arthrodesis status: Secondary | ICD-10-CM

## 2017-09-19 DIAGNOSIS — Z91018 Allergy to other foods: Secondary | ICD-10-CM

## 2017-09-19 DIAGNOSIS — R05 Cough: Secondary | ICD-10-CM | POA: Diagnosis not present

## 2017-09-19 DIAGNOSIS — Z888 Allergy status to other drugs, medicaments and biological substances status: Secondary | ICD-10-CM

## 2017-09-19 DIAGNOSIS — G40909 Epilepsy, unspecified, not intractable, without status epilepticus: Secondary | ICD-10-CM | POA: Diagnosis present

## 2017-09-19 DIAGNOSIS — I252 Old myocardial infarction: Secondary | ICD-10-CM

## 2017-09-19 DIAGNOSIS — N183 Chronic kidney disease, stage 3 unspecified: Secondary | ICD-10-CM | POA: Diagnosis present

## 2017-09-19 DIAGNOSIS — K219 Gastro-esophageal reflux disease without esophagitis: Secondary | ICD-10-CM | POA: Diagnosis present

## 2017-09-19 DIAGNOSIS — R4182 Altered mental status, unspecified: Secondary | ICD-10-CM | POA: Diagnosis not present

## 2017-09-19 DIAGNOSIS — J189 Pneumonia, unspecified organism: Principal | ICD-10-CM | POA: Diagnosis present

## 2017-09-19 DIAGNOSIS — I482 Chronic atrial fibrillation: Secondary | ICD-10-CM | POA: Diagnosis not present

## 2017-09-19 DIAGNOSIS — E785 Hyperlipidemia, unspecified: Secondary | ICD-10-CM | POA: Diagnosis present

## 2017-09-19 DIAGNOSIS — Z885 Allergy status to narcotic agent status: Secondary | ICD-10-CM

## 2017-09-19 DIAGNOSIS — Z91010 Allergy to peanuts: Secondary | ICD-10-CM

## 2017-09-19 DIAGNOSIS — Z7982 Long term (current) use of aspirin: Secondary | ICD-10-CM

## 2017-09-19 DIAGNOSIS — Z951 Presence of aortocoronary bypass graft: Secondary | ICD-10-CM

## 2017-09-19 DIAGNOSIS — Z86718 Personal history of other venous thrombosis and embolism: Secondary | ICD-10-CM

## 2017-09-19 DIAGNOSIS — Z95828 Presence of other vascular implants and grafts: Secondary | ICD-10-CM

## 2017-09-19 DIAGNOSIS — Z8673 Personal history of transient ischemic attack (TIA), and cerebral infarction without residual deficits: Secondary | ICD-10-CM

## 2017-09-19 LAB — CBC
HCT: 32.7 % — ABNORMAL LOW (ref 36.0–46.0)
HEMOGLOBIN: 9.6 g/dL — AB (ref 12.0–15.0)
MCH: 22.9 pg — AB (ref 26.0–34.0)
MCHC: 29.4 g/dL — ABNORMAL LOW (ref 30.0–36.0)
MCV: 77.9 fL — ABNORMAL LOW (ref 78.0–100.0)
Platelets: 227 10*3/uL (ref 150–400)
RBC: 4.2 MIL/uL (ref 3.87–5.11)
RDW: 19.5 % — ABNORMAL HIGH (ref 11.5–15.5)
WBC: 7.4 10*3/uL (ref 4.0–10.5)

## 2017-09-19 LAB — URINALYSIS, ROUTINE W REFLEX MICROSCOPIC
Bilirubin Urine: NEGATIVE
Glucose, UA: NEGATIVE mg/dL
Hgb urine dipstick: NEGATIVE
Ketones, ur: NEGATIVE mg/dL
Leukocytes, UA: NEGATIVE
NITRITE: NEGATIVE
PROTEIN: NEGATIVE mg/dL
SPECIFIC GRAVITY, URINE: 1.006 (ref 1.005–1.030)
pH: 5 (ref 5.0–8.0)

## 2017-09-19 LAB — COMPREHENSIVE METABOLIC PANEL
ALBUMIN: 3.7 g/dL (ref 3.5–5.0)
ALK PHOS: 80 U/L (ref 38–126)
ALT: 27 U/L (ref 14–54)
ANION GAP: 13 (ref 5–15)
AST: 34 U/L (ref 15–41)
BUN: 24 mg/dL — ABNORMAL HIGH (ref 6–20)
CALCIUM: 9.6 mg/dL (ref 8.9–10.3)
CHLORIDE: 106 mmol/L (ref 101–111)
CO2: 19 mmol/L — AB (ref 22–32)
CREATININE: 1.47 mg/dL — AB (ref 0.44–1.00)
GFR calc Af Amer: 38 mL/min — ABNORMAL LOW (ref >60)
GFR calc non Af Amer: 33 mL/min — ABNORMAL LOW (ref >60)
GLUCOSE: 109 mg/dL — AB (ref 65–99)
Potassium: 4.4 mmol/L (ref 3.5–5.1)
SODIUM: 138 mmol/L (ref 135–145)
Total Bilirubin: 0.4 mg/dL (ref 0.3–1.2)
Total Protein: 7.7 g/dL (ref 6.5–8.1)

## 2017-09-19 LAB — CBG MONITORING, ED: GLUCOSE-CAPILLARY: 128 mg/dL — AB (ref 65–99)

## 2017-09-19 MED ORDER — SODIUM CHLORIDE 0.9 % IV SOLN
INTRAVENOUS | Status: AC
Start: 2017-09-19 — End: 2017-09-20
  Administered 2017-09-19: 20:00:00 via INTRAVENOUS

## 2017-09-19 MED ORDER — ACETAMINOPHEN 325 MG PO TABS
650.0000 mg | ORAL_TABLET | Freq: Four times a day (QID) | ORAL | Status: DC | PRN
  Administered 2017-09-20 – 2017-09-23 (×3): 650 mg via ORAL
  Filled 2017-09-19 (×3): qty 2

## 2017-09-19 MED ORDER — ATORVASTATIN CALCIUM 40 MG PO TABS
40.0000 mg | ORAL_TABLET | Freq: Every day | ORAL | Status: DC
  Administered 2017-09-19 – 2017-09-23 (×5): 40 mg via ORAL
  Filled 2017-09-19 (×6): qty 1

## 2017-09-19 MED ORDER — ASPIRIN EC 81 MG PO TBEC
81.0000 mg | DELAYED_RELEASE_TABLET | Freq: Every day | ORAL | Status: DC
  Administered 2017-09-20 – 2017-09-25 (×5): 81 mg via ORAL
  Filled 2017-09-19 (×6): qty 1

## 2017-09-19 MED ORDER — METOPROLOL TARTRATE 25 MG PO TABS
12.5000 mg | ORAL_TABLET | Freq: Two times a day (BID) | ORAL | Status: DC
  Administered 2017-09-19 – 2017-09-25 (×10): 12.5 mg via ORAL
  Filled 2017-09-19 (×12): qty 1

## 2017-09-19 MED ORDER — FERROUS SULFATE 325 (65 FE) MG PO TABS
325.0000 mg | ORAL_TABLET | Freq: Every day | ORAL | Status: DC
  Administered 2017-09-20 – 2017-09-25 (×5): 325 mg via ORAL
  Filled 2017-09-19 (×6): qty 1

## 2017-09-19 MED ORDER — OMEGA-3-ACID ETHYL ESTERS 1 G PO CAPS
2.0000 g | ORAL_CAPSULE | Freq: Two times a day (BID) | ORAL | Status: DC
  Administered 2017-09-19 – 2017-09-25 (×10): 2 g via ORAL
  Filled 2017-09-19 (×12): qty 2

## 2017-09-19 MED ORDER — ACETAMINOPHEN 650 MG RE SUPP: 650.0000 mg | Freq: Four times a day (QID) | RECTAL | Status: DC | PRN

## 2017-09-19 MED ORDER — SENNOSIDES-DOCUSATE SODIUM 8.6-50 MG PO TABS
1.0000 | ORAL_TABLET | Freq: Two times a day (BID) | ORAL | Status: DC
  Administered 2017-09-19 – 2017-09-25 (×10): 1 via ORAL
  Filled 2017-09-19 (×12): qty 1

## 2017-09-19 MED ORDER — LEVETIRACETAM 500 MG PO TABS
500.0000 mg | ORAL_TABLET | Freq: Two times a day (BID) | ORAL | Status: DC
  Administered 2017-09-19 – 2017-09-25 (×8): 500 mg via ORAL
  Filled 2017-09-19 (×12): qty 1

## 2017-09-19 MED ORDER — PANTOPRAZOLE SODIUM 40 MG PO TBEC
40.0000 mg | DELAYED_RELEASE_TABLET | Freq: Every day | ORAL | Status: DC
  Administered 2017-09-20 – 2017-09-25 (×5): 40 mg via ORAL
  Filled 2017-09-19 (×6): qty 1

## 2017-09-19 MED ORDER — APIXABAN 5 MG PO TABS
5.0000 mg | ORAL_TABLET | Freq: Two times a day (BID) | ORAL | Status: DC
  Administered 2017-09-19 – 2017-09-25 (×10): 5 mg via ORAL
  Filled 2017-09-19 (×12): qty 1

## 2017-09-19 MED ORDER — DEXTROSE 5 % IV SOLN
500.0000 mg | INTRAVENOUS | Status: DC
  Administered 2017-09-19 – 2017-09-24 (×6): 500 mg via INTRAVENOUS
  Filled 2017-09-19 (×7): qty 500

## 2017-09-19 MED ORDER — MELATONIN 3 MG PO TABS: 3.0000 mg | ORAL_TABLET | Freq: Every evening | ORAL | Status: DC | PRN

## 2017-09-19 MED ORDER — ONDANSETRON HCL 4 MG/2ML IJ SOLN
4.0000 mg | Freq: Four times a day (QID) | INTRAMUSCULAR | Status: DC | PRN
Start: 2017-09-19 — End: 2017-09-25

## 2017-09-19 MED ORDER — KETOROLAC TROMETHAMINE 0.5 % OP SOLN
1.0000 [drp] | Freq: Four times a day (QID) | OPHTHALMIC | Status: DC
  Administered 2017-09-20 – 2017-09-25 (×17): 1 [drp] via OPHTHALMIC
  Filled 2017-09-19: qty 3

## 2017-09-19 MED ORDER — ONDANSETRON HCL 4 MG PO TABS: 4.0000 mg | ORAL_TABLET | Freq: Four times a day (QID) | ORAL | Status: DC | PRN

## 2017-09-19 MED ORDER — DEXTROSE 5 % IV SOLN
1.0000 g | Freq: Once | INTRAVENOUS | Status: AC
  Administered 2017-09-19: 1 g via INTRAVENOUS
  Filled 2017-09-19: qty 10

## 2017-09-19 MED ORDER — LUBIPROSTONE 24 MCG PO CAPS
24.0000 ug | ORAL_CAPSULE | Freq: Two times a day (BID) | ORAL | Status: DC
  Administered 2017-09-20 – 2017-09-25 (×10): 24 ug via ORAL
  Filled 2017-09-19 (×11): qty 1

## 2017-09-19 NOTE — ED Notes (Signed)
Pt ambulated to Bathroom with minimal assistance. Pt stated that she felt like normal when walking around and that she was only a little tired.

## 2017-09-19 NOTE — ED Provider Notes (Signed)
[facility] EMERGENCY DEPARTMENT Provider Note   CSN: [id number] Arrival date & time: 09/19/17  1343     History   Chief Complaint Chief Complaint  Patient presents with  . Altered Mental Status  . Dysuria    HPI [name] is a 76 y.o. female.  HPI 76 year old female who presents with confusion. She has a history of DVT on Eliquis, heart block status post permanent pacemaker, CAD status post CABG, COPD, diabetes and seizure disorder on Keppra. History is provided by patient's daughter who reports that for the past 2 days, she has had increased confusion. Reports that today she got dressed twice to go to church on a Tuesday, and thought it was Sunday. She has been disoriented and confused. She has had similar symptoms when she has had UTIs in the past. She is had dysuria with urinary frequency over the past 2 days as well as cough. She has not fallen or hit her head. Has a steady gait. No focal numbness or weakness, vision or speech changes, chest pain, abdominal pain, nausea or vomiting. She has been also having loose stools. Past Medical History:  Diagnosis Date  . Anemia   . Anginal pain (HCC)    "all the time, but not heart related"  . Arthritis    "legs and back" (09/30/2013)  . Asthma    years ago  . 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 Blurred vision, bilateral    sometimes double vision  . CAD (coronary artery disease)    multivessel, CABG 6/10.  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  Myoview (11/20/11) showed no ischemia & EF 62%  . Chronic lower back pain   . COPD (chronic obstructive pulmonary disease) (HCC)    severe lung disease by PFTs 6/12  . DDD (degenerative disc disease)   . Deep vein thrombosis, upper right extremity (HCC)    post-PPM  . Dehiscence of closure of sternum or sternotomy 07/2009   sternal infection; required flap closure  . Diabetes mellitus, type II (HCC)   . Gastroesophageal reflux disease    not much    . H/O hiatal hernia   . History of kidney stones   . Hyperlipidemia   . Hypertension   . Migraines    "years ago" (09/30/2013)  . Obesity   . Orthopnea    sometimes  . Pacemaker   . PUD (peptic ulcer disease)   . Seizures (HCC)    "long time ago; don't remember what they were related to" (09/30/2013)none recent  . Sleep apnea    denies CPAP use on 09/30/2013  . Stroke [facility])    years ago, "residual on left back"  . Urge incontinence of urine     Patient Active Problem List   Diagnosis Date Noted  . Concussion with no loss of consciousness 09/28/2016  . AKI (acute kidney injury) (HCC) 09/25/2016  . Acute kidney injury (nontraumatic) (HCC) 09/25/2016  . Fall 09/25/2016  . Acute kidney injury (HCC) 09/25/2016  . Chest pain 08/29/2016  . Atrial fibrillation (HCC) 02/06/2015  . Encephalopathy, metabolic 10/12/2014  . Intractable nausea and vomiting 10/09/2014  . Epigastric abdominal pain 10/09/2014  . Expected blood loss anemia 12/04/2013  . S/P left THA, AA 12/03/2013  . Protein-calorie malnutrition, severe (HCC) 10/02/2013  . Precordial pain 09/30/2013  . Failure to thrive 07/26/2013  . Anemia of chronic disease 07/13/2013  . Altered mental state 07/13/2013  . NSTEMI (non-ST  elevated myocardial infarction) (HCC) 07/01/2013  . Acute delirium 06/29/2013  . Fecal impaction (HCC) 06/29/2013  . Total knee replacement status 05/22/2013  . H/O total hip arthroplasty 05/22/2013  . Spinal stenosis, lumbar region, with neurogenic claudication 05/22/2013  . Osteoarthritis of left hip 05/22/2013  . Noninfectious gastroenteritis and colitis 03/05/2013  . Dehydration 03/05/2013  . Family history of colon cancer 02/15/2013  . PUD (peptic ulcer disease) 02/15/2013  . Diabetes mellitus, type II (HCC)   . Hypertension   . Arteriosclerotic cardiovascular disease (ASCVD)   . Gastroesophageal reflux disease   . Hyperlipidemia   . Deep vein thrombosis, upper right extremity (HCC)   .  Obesity   . AV block   . COPD (chronic obstructive pulmonary disease) (HCC)   . Sleep apnea   . Chronic kidney disease (CKD), stage III (moderate) (HCC)   . Seizure disorder (HCC)   . Anemia, normocytic normochromic 12/15/2011  . Chronic diastolic heart failure (HCC) 11/18/2011  . PACEMAKER, PERMANENT 05/13/2009  . 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 GASTRIC ULCER 05/12/2009  . Gastroparesis 05/12/2009  . DIVERTICULAR DISEASE 05/12/2009    Past Surgical History:  Procedure Laterality Date  . ABDOMINAL HYSTERECTOMY     partial  . APPENDECTOMY    . BACK SURGERY     x3 total  . CESAREAN SECTION  ~1980  . CHOLECYSTECTOMY    . COLONOSCOPY  04/27/2005   hemorrhoids, diverticula  . COLONOSCOPY WITH ESOPHAGOGASTRODUODENOSCOPY (EGD) N/A 02/20/2013   RMR: pancolonic diverticulosis and internal hemorrhoids. EGD with small hiatal hernia, healed gastric ulcer  . CORONARY ARTERY BYPASS GRAFT  05/1999   - LIMA to LAD, SVG to OM, SVG to PDA  . ESOPHAGOGASTRODUODENOSCOPY     with [facility] dilation,  biopsy, and disruption Schatzki's ring  . ESOPHAGOGASTRODUODENOSCOPY  09/10/2012   RMR: Noncritical Schatzki's ring. Diffuse gastric erosions and an  area of partially healing ulceration-status post biopsy/ Small hiatal hernia. Bx reactive gastropathy.  . INSERT / REPLACE / REMOVE PACEMAKER  1993  . JOINT REPLACEMENT    . LUMBAR DISC SURGERY    . PACEMAKER PLACEMENT  1993   Status post DDD pacemaker implantation in 1993, with Medtronic Kappa generator change in 2003 for  second-degree AV block, most recent gen change by Fawn Kirk 04/14/11  . PORTACATH PLACEMENT Left 07/17/2013   Procedure: INSERTION PORT-A-CATH-left subclavian;  Surgeon: Fabio Bering, MD;  Location: [facility];  Service: General;  Laterality: Left;  . POSTERIOR LUMBAR FUSION    . RECONSTRUCTIVE REPAIR STERNAL     for infection s/p CABG 8/10  . REPLACEMENT TOTAL KNEE Bilateral   . THYROIDECTOMY    . TOTAL HIP ARTHROPLASTY Right   . TOTAL HIP  ARTHROPLASTY Left 12/03/2013   Procedure: LEFT TOTAL HIP ARTHROPLASTY ANTERIOR APPROACH;  Surgeon: Shelda Pal, MD;  Location: [facility];  Service: Orthopedics;  Laterality: Left;  . TUBAL LIGATION    . WRIST SURGERY Left    "tumor taken off" (09/30/2013)    OB History    Gravida Para Term Preterm AB Living   SAB TAB Ectopic Multiple Live Births                   Home Medications    Prior to Admission medications   Medication Sig Start Date End Date Taking? Authorizing Provider  apixaban (ELIQUIS) 5 MG TABS tablet Take 5 mg by mouth 2 (two) times daily.  Yes [provider]  aspirin EC 81 MG tablet Take 81 mg by mouth daily.   Yes [provider]  atorvastatin (LIPITOR) 40 MG tablet Take 40 mg by mouth at bedtime.   Yes [provider]  benazepril (LOTENSIN) 40 MG tablet Take 40 mg by mouth daily.    Yes [provider]  furosemide (LASIX) 40 MG tablet Take 40 mg by mouth daily as needed for fluid.   Yes [provider]  gabapentin (NEURONTIN) 100 MG capsule Take 200 mg by mouth at bedtime.   Yes [provider]  levETIRAcetam (KEPPRA) 500 MG tablet Take 500 mg by mouth 2 (two) times daily.   Yes [provider]  lubiprostone (AMITIZA) 24 MCG capsule Take 24 mcg by mouth 2 (two) times daily with a meal.   Yes [provider]  Melatonin 3 MG TABS Take 3 mg by mouth at bedtime as needed (for sleep).   Yes [provider]  metFORMIN (GLUCOPHAGE) 500 MG tablet Take 500 mg by mouth 2 (two) times daily with a meal.    Yes [provider]  metoprolol tartrate (LOPRESSOR) 25 MG tablet Take 12.5 mg by mouth 2 (two) times daily.    Yes [provider]  nitroGLYCERIN (NITROSTAT) 0.4 MG SL tablet Place 1 tablet (0.4 mg total) under the tongue every 5 (five) minutes as needed for chest pain. 05/20/14  Yes BranchDorothe Pea, MD  omega-3 acid ethyl esters (LOVAZA) 1 G capsule Take 2 g  by mouth 2 (two) times daily.    Yes [provider]  ondansetron (ZOFRAN-ODT) 4 MG disintegrating tablet Take 4 mg by mouth every 8 (eight) hours as needed for nausea or vomiting.   Yes [provider]  pantoprazole (PROTONIX) 40 MG tablet Take 1 tablet (40 mg total) by mouth daily. 12/03/13  Yes Gelene Mink, NP  potassium chloride (K-DUR,KLOR-CON) 10 MEQ tablet Take 10 mEq by mouth daily.   Yes [provider]  senna-docusate (SENEXON-S) 8.6-50 MG tablet Take 1 tablet by mouth 2 (two) times daily.   Yes [provider]  traZODone (DESYREL) 50 MG tablet Take 50 mg by mouth at bedtime.   Yes [provider]  acetaminophen (TYLENOL) 325 MG tablet Take 325-650 mg by mouth every 6 (six) hours as needed for mild pain, moderate pain or headache.     [provider]  ferrous sulfate 325 (65 FE) MG tablet Take 325 mg by mouth daily with breakfast.    [provider]  ibuprofen (ADVIL,MOTRIN) 200 MG tablet Take 600 mg by mouth every 6 (six) hours as needed for headache, mild pain or moderate pain.    [provider]  ketorolac (ACULAR) 0.5 % ophthalmic solution Place 1 drop into the left eye 4 (four) times daily.     [provider]    Family History Family History  Problem Relation Age of Onset  . Heart disease Mother        MI  . Colon cancer Father        age 11  . Diabetes Unknown   . Coronary artery disease Unknown        Female <55  . Cancer Unknown     Social History Social History  Substance Use Topics  . Smoking status: Never Smoker  . Smokeless tobacco: Former Neurosurgeon    Types: Snuff     Comment: 09/30/2013 "stopped snuff a long time ago"  . Alcohol use  No     Allergies   Codeine; Food; Oxycodone hcl; Peanut-containing drug products; and Reglan [metoclopramide]   Review of Systems Review of Systems  Constitutional: Negative for fever.  Respiratory: Positive for cough.   Cardiovascular: Negative  for chest pain.  Gastrointestinal: Negative for abdominal pain.  Genitourinary: Positive for dysuria and frequency.  Neurological: Negative for headaches.  Hematological: Bruises/bleeds easily.  Psychiatric/Behavioral: Positive for confusion.  All other systems reviewed and are negative.    Physical Exam Updated Vital Signs BP (!) 163/103 (BP Location: Left Arm)   Pulse 75   Temp 98.6 F (37 C) (Oral)   Resp 18   Ht 5\' 2"  (1.575 m)   Wt 81.6 kg (180 lb)   SpO2 97%   BMI 32.92 kg/m   Physical Exam Physical Exam  Nursing note and vitals reviewed. Constitutional: Well developed, well nourished, non-toxic, and in no acute distress Head: Normocephalic and atraumatic.  Mouth/Throat: Oropharynx is clear and moist.  Neck: Normal range of motion. Neck supple.  Cardiovascular: Normal rate and regular rhythm.   Pulmonary/Chest: Effort normal and breath sounds normal.  Abdominal: Soft. There is no tenderness. There is no rebound and no guarding.  Musculoskeletal: Normal range of motion.  Neurological: Alert, no facial droop, fluent speech, moves all extremities symmetrically, PERRL, EOMI, sensation to light touch in tact throughout, no pronator drift, full strength bilateral ankle dorsiflexion/plantarflexion Skin: Skin is warm and dry.  Psychiatric: Cooperative   ED Treatments / Results  Labs (all labs ordered are listed, but only abnormal results are displayed) Labs Reviewed  COMPREHENSIVE METABOLIC PANEL - Abnormal; Notable for the following:       Result Value   CO2 19 (*)    Glucose, Bld 109 (*)    BUN 24 (*)    Creatinine, Ser 1.47 (*)    GFR calc non Af Amer 33 (*)    GFR calc Af Amer 38 (*)    All other components within normal limits  CBC - Abnormal; Notable for the following:    Hemoglobin 9.6 (*)    HCT 32.7 (*)    MCV 77.9 (*)    MCH 22.9 (*)    MCHC 29.4 (*)    RDW 19.5 (*)    All other components within normal limits  URINALYSIS, ROUTINE W REFLEX  MICROSCOPIC - Abnormal; Notable for the following:    Color, Urine STRAW (*)    All other components within normal limits  CBG MONITORING, ED - Abnormal; Notable for the following:    Glucose-Capillary 128 (*)    All other components within normal limits    EKG  EKG Interpretation None       Radiology Dg Chest 2 View  Result Date: 09/19/2017 CLINICAL DATA:  Increasing level of confusion over the last 3 days, cough EXAM: CHEST  2 VIEW COMPARISON:  Chest x-ray of 08/29/2016 FINDINGS: There are slightly prominent markings at the right lung base suspicious for developing pneumonia. The left lung appears clear. No pleural effusion is seen. Moderate cardiomegaly is stable. 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 IMPRESSION: Slightly prominent markings at the right lung base suspicious for developing pneumonia. Recommend followup. Electronically Signed   By: Dwyane Dee M.D.   On: 09/19/2017 16:26   Ct Head Wo Contrast  Result Date: 09/19/2017 CLINICAL DATA:  76 year old hypertensive female with confusion. No known injury. Initial encounter. EXAM: CT HEAD WITHOUT CONTRAST TECHNIQUE: Contiguous axial images were obtained from the base of  the skull through the vertex without intravenous contrast. COMPARISON:  09/24/2016 head CT. FINDINGS: Brain: No intracranial hemorrhage or CT evidence of large acute infarct. Remote left thalamic infarct. Moderate chronic microvascular changes. Global atrophy without hydrocephalus. No intracranial mass lesion noted on this unenhanced exam. Vascular: Prominent vascular calcifications. Skull: No acute abnormality. Sinuses/Orbits: Exophthalmos. Visualized paranasal sinuses are clear. Other: Mastoid air cells and middle ear cavities are clear. IMPRESSION: No intracranial hemorrhage or CT evidence of large acute infarct. Remote left thalamic infarct. Moderate chronic microvascular changes. Global atrophy. Electronically Signed   By: Lacy Duverney  M.D.   On: 09/19/2017 16:15    Procedures Procedures (including critical care time)  Medications Ordered in ED Medications  cefTRIAXone (ROCEPHIN) 1 g in dextrose 5 % 50 mL IVPB (not administered)  azithromycin (ZITHROMAX) 500 mg in dextrose 5 % 250 mL IVPB (not administered)     Initial Impression / Assessment and Plan / ED Course  I have reviewed the triage vital signs and the nursing notes.  Pertinent labs & imaging results that were available during my care of the patient were reviewed by me and considered in my medical decision making (see chart for details).     Presents with confusion, disorientation 3 days. On presentation she is nontoxic and in no acute distress. Afebrile and hemodynamically stable. She has no focal neurological deficits.  No evidence of UTI on UA. However she does have developing infiltrate in the right middle lobe on her chest x-ray. No major L at her metabolic derangements on her blood work. Stable renal function. I'll likely developing pneumonia that is contributing to delirium. She is started on ceftriaxone and azithromycin. Discussed with Dr. Arbutus Leas who will admit for observation.  Final Clinical Impressions(s) / ED Diagnoses   Final diagnoses:  Lobar pneumonia [facility])  Disorientation    New Prescriptions New Prescriptions   No medications on file     Lavera Guise, MD 09/19/17 1701

## 2017-09-19 NOTE — ED Triage Notes (Signed)
Pt's daughter c/o increased confusion and dysuria since Sunday. Denies fever.

## 2017-09-19 NOTE — ED Notes (Signed)
Pt ambulated to BR with assistance.

## 2017-09-19 NOTE — H&P (Signed)
History and Physical  [name] ZOX:[id number] DOB: 10-18-1941 DOA: 09/19/2017   PCP: Kari Baars, MD   Patient coming from: Home  Chief Complaint: confusion  HPI:  [name] is a 76 y.o. female with medical history of essential hypertension, diabetes mellitus, hyperlipidemia, atrial fibrillation, CKD stage III presents with confusion that began on 09/17/2017. The patient cannot provide any significant history secondary to encephalopathy. All this history is obtained from speaking with the patient's daughter and reviewed the medical record. At baseline, the patient is alert and oriented 4 and independent with her activities of daily living. The patient does complain of some shortness of breath and nonproductivecoughing for the past couple days. She denies any headache, chest pain, nausea, vomiting, diarrhea, abdominal pain, dysuria, hematuria, hematochezia, melena. There is not been any new medications started.  In the emergency department, the patient was afebrile and hemodynamically stable and saturating 98% on room air. BMP and LFTs were unremarkable. CBC showed a WBC 7.4 with hemoglobin 9.6 which is at her baseline. Urinalysis was negative for pyuria. CT of the brain was negative for any acute findings. Chest x-ray showed right basilar opacity. THe patient was started on ceftriaxone and azithromycin.  Assessment/Plan: Acute metabolic encephalopathy -Likely secondary to infectious process/pneumonia -Urinalysis negative for pyuria -Serum B12 -TSH -Ammonia -holding gabapentin, trazodone  Lobar pneumonia -Personally reviewed chest x-ray--right basilar opacity -Continue ceftriaxone and azithromycin -Presently stable on room air -IV fluids  CKD stage III -Baseline creatinine 1.4-1.6 -discontinue metformin altogether -holding furosemide for now  Essential hypertension -Continue metoprolol tartrate -Holding benazepril secondary to CKD  Atrial fibrillation -Rate  controlled -Continue metoprolol tartrate -Continue apixiban  Hyperlipidemia -Continue statin  Diabetes mellitus type 2 -Discontinue metformin -hemoglobin A1c -NovoLog sliding scale  Coronary artery disease -No anginal symptoms -Ordered EKG  AV block -Status post PPM 1993       Past Medical History:  Diagnosis Date  . Anemia   . Anginal pain (HCC)    "all the time, but not heart related"  . Arthritis    "legs and back" (09/30/2013)  . Asthma    years ago  . 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 Blurred vision, bilateral    sometimes double vision  . CAD (coronary artery disease)    multivessel, CABG 6/10.  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  Myoview (11/20/11) showed no ischemia & EF 62%  . Chronic lower back pain   . COPD (chronic obstructive pulmonary disease) (HCC)    severe lung disease by PFTs 6/12  . DDD (degenerative disc disease)   . Deep vein thrombosis, upper right extremity (HCC)    post-PPM  . Dehiscence of closure of sternum or sternotomy 07/2009   sternal infection; required flap closure  . Diabetes mellitus, type II (HCC)   . Gastroesophageal reflux disease    not much  . H/O hiatal hernia   . History of kidney stones   . Hyperlipidemia   . Hypertension   . Migraines    "years ago" (09/30/2013)  . Obesity   . Orthopnea    sometimes  . Pacemaker   . PUD (peptic ulcer disease)   . Seizures (HCC)    "long time ago; don't remember what they were related to" (09/30/2013)none recent  . Sleep apnea    denies CPAP use on 09/30/2013  . 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 Urge incontinence  of urine    Past Surgical History:  Procedure Laterality Date  . ABDOMINAL HYSTERECTOMY     partial  . APPENDECTOMY    . BACK SURGERY     x3 total  . CESAREAN SECTION  ~1980  . CHOLECYSTECTOMY    . COLONOSCOPY  04/27/2005   hemorrhoids, diverticula  . COLONOSCOPY WITH ESOPHAGOGASTRODUODENOSCOPY (EGD)  N/A 02/20/2013   RMR: pancolonic diverticulosis and internal hemorrhoids. EGD with small hiatal hernia, healed gastric ulcer  . CORONARY ARTERY BYPASS GRAFT  05/1999   - LIMA to LAD, SVG to OM, SVG to PDA  . ESOPHAGOGASTRODUODENOSCOPY     with [facility] dilation,  biopsy, and disruption Schatzki's ring  . ESOPHAGOGASTRODUODENOSCOPY  09/10/2012   RMR: Noncritical Schatzki's ring. Diffuse gastric erosions and an  area of partially healing ulceration-status post biopsy/ Small hiatal hernia. Bx reactive gastropathy.  . INSERT / REPLACE / REMOVE PACEMAKER  1993  . JOINT REPLACEMENT    . LUMBAR DISC SURGERY    . PACEMAKER PLACEMENT  1993   Status post DDD pacemaker implantation in 1993, with Medtronic Kappa generator change in 2003 for  second-degree AV block, most recent gen change by Fawn Kirk 04/14/11  . PORTACATH PLACEMENT Left 07/17/2013   Procedure: INSERTION PORT-A-CATH-left subclavian;  Surgeon: Fabio Bering, MD;  Location: [facility];  Service: General;  Laterality: Left;  . POSTERIOR LUMBAR FUSION    . RECONSTRUCTIVE REPAIR STERNAL     for infection s/p CABG 8/10  . REPLACEMENT TOTAL KNEE Bilateral   . THYROIDECTOMY    . TOTAL HIP ARTHROPLASTY Right   . TOTAL HIP ARTHROPLASTY Left 12/03/2013   Procedure: LEFT TOTAL HIP ARTHROPLASTY ANTERIOR APPROACH;  Surgeon: Shelda Pal, MD;  Location: [facility];  Service: Orthopedics;  Laterality: Left;  . TUBAL LIGATION    . WRIST SURGERY Left    "tumor taken off" (09/30/2013)   Social History:  reports that she has never smoked. She has quit using smokeless tobacco. Her smokeless tobacco use included Snuff. She reports that she does not drink alcohol or use drugs.   Family History  Problem Relation Age of Onset  . Heart disease Mother        MI  . Colon cancer Father        age 32  . Diabetes Unknown   . Coronary artery disease Unknown        Female <55  . Cancer Unknown      Allergies  Allergen Reactions  . Codeine Other (See Comments)     Reaction:  Headaches   . Food Nausea And Vomiting and Other (See Comments)    Pt is allergic to leafy raw vegetables and seeds.    . Oxycodone Hcl Other (See Comments)    Reaction:  Headaches   . Peanut-Containing Drug Products Nausea And Vomiting  . Reglan [Metoclopramide] Other (See Comments)    Reaction:  Confusion      Prior to Admission medications   Medication Sig Start Date End Date Taking? Authorizing Provider  apixaban (ELIQUIS) 5 MG TABS tablet Take 5 mg by mouth 2 (two) times daily.    Yes [provider]  aspirin EC 81 MG tablet Take 81 mg by mouth daily.   Yes [provider]  atorvastatin (LIPITOR) 40 MG tablet Take 40 mg by mouth at bedtime.   Yes [provider]  benazepril (LOTENSIN) 40 MG tablet Take 40 mg by mouth daily.    Yes [provider]  furosemide (  LASIX) 40 MG tablet Take 40 mg by mouth daily as needed for fluid.   Yes [provider]  gabapentin (NEURONTIN) 100 MG capsule Take 200 mg by mouth at bedtime.   Yes [provider]  levETIRAcetam (KEPPRA) 500 MG tablet Take 500 mg by mouth 2 (two) times daily.   Yes [provider]  lubiprostone (AMITIZA) 24 MCG capsule Take 24 mcg by mouth 2 (two) times daily with a meal.   Yes [provider]  Melatonin 3 MG TABS Take 3 mg by mouth at bedtime as needed (for sleep).   Yes [provider]  metFORMIN (GLUCOPHAGE) 500 MG tablet Take 500 mg by mouth 2 (two) times daily with a meal.    Yes [provider]  metoprolol tartrate (LOPRESSOR) 25 MG tablet Take 12.5 mg by mouth 2 (two) times daily.    Yes [provider]  nitroGLYCERIN (NITROSTAT) 0.4 MG SL tablet Place 1 tablet (0.4 mg total) under the tongue every 5 (five) minutes as needed for chest pain. 05/20/14  Yes BranchDorothe Pea, MD  omega-3 acid ethyl esters (LOVAZA) 1 G capsule Take 2 g by mouth 2 (two) times daily.    Yes [provider]  ondansetron  (ZOFRAN-ODT) 4 MG disintegrating tablet Take 4 mg by mouth every 8 (eight) hours as needed for nausea or vomiting.   Yes [provider]  pantoprazole (PROTONIX) 40 MG tablet Take 1 tablet (40 mg total) by mouth daily. 12/03/13  Yes Gelene Mink, NP  potassium chloride (K-DUR,KLOR-CON) 10 MEQ tablet Take 10 mEq by mouth daily.   Yes [provider]  senna-docusate (SENEXON-S) 8.6-50 MG tablet Take 1 tablet by mouth 2 (two) times daily.   Yes [provider]  traZODone (DESYREL) 50 MG tablet Take 50 mg by mouth at bedtime.   Yes [provider]  acetaminophen (TYLENOL) 325 MG tablet Take 325-650 mg by mouth every 6 (six) hours as needed for mild pain, moderate pain or headache.     [provider]  ferrous sulfate 325 (65 FE) MG tablet Take 325 mg by mouth daily with breakfast.    [provider]  ibuprofen (ADVIL,MOTRIN) 200 MG tablet Take 600 mg by mouth every 6 (six) hours as needed for headache, mild pain or moderate pain.    [provider]  ketorolac (ACULAR) 0.5 % ophthalmic solution Place 1 drop into the left eye 4 (four) times daily.     [provider]    Review of Systems:  Constitutional:  No weight loss, night sweats, Fevers, chills Head&Eyes: No headache.  No vision loss.  No eye pain or scotoma ENT:  No Difficulty swallowing,Tooth/dental problems,Sore throat,  No ear ache, post nasal drip,  Cardio-vascular:  No chest pain, Orthopnea, PND, swelling in lower extremities,  dizziness, palpitations  GI:  No  abdominal pain, nausea, vomiting, diarrhea, loss of appetite, hematochezia, melena, heartburn, indigestion, Resp:   No coughing up of blood .No wheezing.No chest wall deformity  Skin:  no rash or lesions.  GU:  no dysuria, change in color of urine, no urgency or frequency. No flank pain.  Musculoskeletal:  No joint pain or swelling. No decreased range of motion. No back pain.  Psych:  No change in  mood or affect. No depression or anxiety. Neurologic: No headache, no dysesthesia, no focal weakness, no vision loss. No syncope  Physical Exam: Vitals:   09/19/17 1354 09/19/17 1355 09/19/17 1629 09/19/17 1700  BP: Marland Kitchen)  154/108  (!) 163/103 (!) 176/96  Pulse: 75  75   Resp: 20  18 17   Temp: 98.6 F (37 C)     TempSrc: Oral     SpO2: 98%  97%   Weight:  81.6 kg (180 lb)    Height:  5\' 2"  (1.575 m)     General:  A&O x 1, NAD, nontoxic, pleasant/cooperative Head/Eye: No conjunctival hemorrhage, no icterus, [place]/AT, No nystagmus ENT:  No icterus,  No thrush, good dentition, no pharyngeal exudate Neck:  No masses, no lymphadenpathy, no bruits CV:  IRRR, no rub, no gallop, no S3 Lung:  Bibasilar crackles, right greater than left. No wheezing. Abdomen: soft/epigastric tender, +BS, nondistended, no peritoneal signs Ext: No cyanosis, No rashes, No petechiae, No lymphangitis, 1 + LE edema Neuro: CNII-XII intact, strength 4/5 in bilateral upper and lower extremities, no dysmetria  Labs on Admission:  Basic Metabolic Panel:  Recent Labs Lab 09/19/17 1406  NA 138  K 4.4  CL 106  CO2 19*  GLUCOSE 109*  BUN 24*  CREATININE 1.47*  CALCIUM 9.6   Liver Function Tests:  Recent Labs Lab 09/19/17 1406  AST 34  ALT 27  ALKPHOS 80  BILITOT 0.4  PROT 7.7  ALBUMIN 3.7   No results for input(s): LIPASE, AMYLASE in the last 168 hours. No results for input(s): AMMONIA in the last 168 hours. CBC:  Recent Labs Lab 09/19/17 1406  WBC 7.4  HGB 9.6*  HCT 32.7*  MCV 77.9*  PLT 227   Coagulation Profile: No results for input(s): INR, PROTIME in the last 168 hours. Cardiac Enzymes: No results for input(s): CKTOTAL, CKMB, CKMBINDEX, TROPONINI in the last 168 hours. BNP: Invalid input(s): POCBNP CBG:  Recent Labs Lab 09/19/17 1400  GLUCAP 128*   Urine analysis:    Component Value Date/Time   COLORURINE STRAW (A) 09/19/2017 1359   APPEARANCEUR CLEAR 09/19/2017 1359    LABSPEC 1.006 09/19/2017 1359   PHURINE 5.0 09/19/2017 1359   GLUCOSEU NEGATIVE 09/19/2017 1359   HGBUR NEGATIVE 09/19/2017 1359   BILIRUBINUR NEGATIVE 09/19/2017 1359   KETONESUR NEGATIVE 09/19/2017 1359   PROTEINUR NEGATIVE 09/19/2017 1359   UROBILINOGEN 0.2 02/12/2015 0951   NITRITE NEGATIVE 09/19/2017 1359   LEUKOCYTESUR NEGATIVE 09/19/2017 1359   Sepsis Labs: @LABRCNTIP (procalcitonin:4,lacticidven:4) )No results found for this or any previous visit (from the past 240 hour(s)).   Radiological Exams on Admission: Dg Chest 2 View  Result Date: 09/19/2017 CLINICAL DATA:  Increasing level of confusion over the last 3 days, cough EXAM: CHEST  2 VIEW COMPARISON:  Chest x-ray of 08/29/2016 FINDINGS: There are slightly prominent markings at the right lung base suspicious for developing pneumonia. The left lung appears clear. No pleural effusion is seen. Moderate cardiomegaly is stable. 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 IMPRESSION: Slightly prominent markings at the right lung base suspicious for developing pneumonia. Recommend followup. Electronically Signed   By: Dwyane Dee M.D.   On: 09/19/2017 16:26   Ct Head Wo Contrast  Result Date: 09/19/2017 CLINICAL DATA:  76 year old hypertensive female with confusion. No known injury. Initial encounter. EXAM: CT HEAD WITHOUT CONTRAST TECHNIQUE: Contiguous axial images were obtained from the base of the skull through the vertex without intravenous contrast. COMPARISON:  09/24/2016 head CT. FINDINGS: Brain: No intracranial hemorrhage or CT evidence of large acute infarct. Remote left thalamic infarct. Moderate chronic microvascular changes. Global atrophy without hydrocephalus. No intracranial mass lesion noted on this unenhanced exam. Vascular: Prominent vascular  calcifications. Skull: No acute abnormality. Sinuses/Orbits: Exophthalmos. Visualized paranasal sinuses are clear. Other: Mastoid air cells and middle ear cavities  are clear. IMPRESSION: No intracranial hemorrhage or CT evidence of large acute infarct. Remote left thalamic infarct. Moderate chronic microvascular changes. Global atrophy. Electronically Signed   By: Lacy Duverney M.D.   On: 09/19/2017 16:15    EKG: Independently reviewed. pending    Time spent:60 minutes Code Status:   FULL Family Communication:  No Family at bedside Disposition Plan: expect 1-2day hospitalization Consults called: none DVT Prophylaxis: apixaban  Kiaira Pointer, DO  [facility] Pager [phone]  If 7PM-7AM, please contact night-coverage [email or web address] Password [email or web address] 09/19/2017, 5:14 PM

## 2017-09-20 LAB — CBC
HEMATOCRIT: 32.1 % — AB (ref 36.0–46.0)
Hemoglobin: 9.7 g/dL — ABNORMAL LOW (ref 12.0–15.0)
MCH: 22.9 pg — ABNORMAL LOW (ref 26.0–34.0)
MCHC: 30.2 g/dL (ref 30.0–36.0)
MCV: 75.9 fL — AB (ref 78.0–100.0)
PLATELETS: 268 10*3/uL (ref 150–400)
RBC: 4.23 MIL/uL (ref 3.87–5.11)
RDW: 19.3 % — AB (ref 11.5–15.5)
WBC: 7.8 10*3/uL (ref 4.0–10.5)

## 2017-09-20 LAB — BASIC METABOLIC PANEL
Anion gap: 12 (ref 5–15)
BUN: 22 mg/dL — AB (ref 6–20)
CHLORIDE: 105 mmol/L (ref 101–111)
CO2: 24 mmol/L (ref 22–32)
CREATININE: 1.15 mg/dL — AB (ref 0.44–1.00)
Calcium: 9.9 mg/dL (ref 8.9–10.3)
GFR calc Af Amer: 51 mL/min — ABNORMAL LOW (ref >60)
GFR calc non Af Amer: 44 mL/min — ABNORMAL LOW (ref >60)
GLUCOSE: 117 mg/dL — AB (ref 65–99)
POTASSIUM: 3.8 mmol/L (ref 3.5–5.1)
SODIUM: 141 mmol/L (ref 135–145)

## 2017-09-20 LAB — HEMOGLOBIN A1C
HEMOGLOBIN A1C: 6.3 % — AB (ref 4.8–5.6)
Mean Plasma Glucose: 134.11 mg/dL

## 2017-09-20 LAB — TSH: TSH: 0.856 u[IU]/mL (ref 0.350–4.500)

## 2017-09-20 LAB — MRSA PCR SCREENING: MRSA by PCR: POSITIVE — AB

## 2017-09-20 LAB — VITAMIN B12: Vitamin B-12: 485 pg/mL (ref 180–914)

## 2017-09-20 LAB — AMMONIA: Ammonia: 22 umol/L (ref 9–35)

## 2017-09-20 MED ORDER — MUPIROCIN 2 % EX OINT
1.0000 "application " | TOPICAL_OINTMENT | Freq: Two times a day (BID) | CUTANEOUS | Status: AC
  Administered 2017-09-20 – 2017-09-24 (×10): 1 via NASAL
  Filled 2017-09-20 (×3): qty 22

## 2017-09-20 MED ORDER — CHLORHEXIDINE GLUCONATE CLOTH 2 % EX PADS
6.0000 | MEDICATED_PAD | Freq: Every day | CUTANEOUS | Status: AC
  Administered 2017-09-20 – 2017-09-24 (×5): 6 via TOPICAL

## 2017-09-20 MED ORDER — INFLUENZA VAC SPLIT HIGH-DOSE 0.5 ML IM SUSY
0.5000 mL | PREFILLED_SYRINGE | INTRAMUSCULAR | Status: DC
  Filled 2017-09-20: qty 0.5

## 2017-09-20 MED ORDER — HYDRALAZINE HCL 20 MG/ML IJ SOLN
10.0000 mg | Freq: Once | INTRAMUSCULAR | Status: AC
  Administered 2017-09-20: 10 mg via INTRAVENOUS
  Filled 2017-09-20: qty 1

## 2017-09-20 NOTE — Progress Notes (Signed)
Pt's BP remains elevated following metoprolol, MD made aware. Will continue to monitor.

## 2017-09-20 NOTE — Progress Notes (Signed)
Subjective: She was admitted with pneumonia and altered mental status from that. She feels better but she is still confused. She says she is not coughing.  Objective: Vital signs in last 24 hours: Temp:  [98 F (36.7 C)-98.6 F (37 C)] 98.5 F (36.9 C) (10/17 0555) Pulse Rate:  [72-110] 110 (10/17 0555) Resp:  [17-24] 20 (10/17 0555) BP: (154-194)/(78-109) 179/95 (10/17 0555) SpO2:  [96 %-100 %] 97 % (10/17 0555) Weight:  [81.6 kg (180 lb)-84.8 kg (187 lb)] 84.8 kg (187 lb) (10/16 1845) Weight change:     Intake/Output from previous day: 10/16 0701 - 10/17 0700 In: 742.5 [I.V.:492.5; IV Piggyback:250] Out: -   PHYSICAL EXAM General appearance: alert, mild distress and Confused Resp: rhonchi bilaterally Cardio: irregularly irregular rhythm GI: soft, non-tender; bowel sounds normal; no masses,  no organomegaly Extremities: extremities normal, atraumatic, no cyanosis or edema Skin warm and dry  Lab Results:  Results for orders placed or performed during the hospital encounter of 09/19/17 (from the past 48 hour(s))  Urinalysis, Routine w reflex microscopic     Status: Abnormal   Collection Time: 09/19/17  1:59 PM  Result Value Ref Range   Color, Urine STRAW (A) YELLOW   APPearance CLEAR CLEAR   Specific Gravity, Urine 1.006 1.005 - 1.030   pH 5.0 5.0 - 8.0   Glucose, UA NEGATIVE NEGATIVE mg/dL   Hgb urine dipstick NEGATIVE NEGATIVE   Bilirubin Urine NEGATIVE NEGATIVE   Ketones, ur NEGATIVE NEGATIVE mg/dL   Protein, ur NEGATIVE NEGATIVE mg/dL   Nitrite NEGATIVE NEGATIVE   Leukocytes, UA NEGATIVE NEGATIVE  CBG monitoring, ED     Status: Abnormal   Collection Time: 09/19/17  2:00 PM  Result Value Ref Range   Glucose-Capillary 128 (H) 65 - 99 mg/dL   Comment 1 Notify RN    Comment 2 Document in Chart   Comprehensive metabolic panel     Status: Abnormal   Collection Time: 09/19/17  2:06 PM  Result Value Ref Range   Sodium 138 135 - 145 mmol/L   Potassium 4.4 3.5 - 5.1  mmol/L   Chloride 106 101 - 111 mmol/L   CO2 19 (L) 22 - 32 mmol/L   Glucose, Bld 109 (H) 65 - 99 mg/dL   BUN 24 (H) 6 - 20 mg/dL   Creatinine, Ser 1.47 (H) 0.44 - 1.00 mg/dL   Calcium 9.6 8.9 - 10.3 mg/dL   Total Protein 7.7 6.5 - 8.1 g/dL   Albumin 3.7 3.5 - 5.0 g/dL   AST 34 15 - 41 U/L   ALT 27 14 - 54 U/L   Alkaline Phosphatase 80 38 - 126 U/L   Total Bilirubin 0.4 0.3 - 1.2 mg/dL   GFR calc non Af Amer 33 (L) >60 mL/min   GFR calc Af Amer 38 (L) >60 mL/min    Comment: (NOTE) The eGFR has been calculated using the CKD EPI equation. This calculation has not been validated in all clinical situations. eGFR's persistently <60 mL/min signify possible Chronic Kidney Disease.    Anion gap 13 5 - 15  CBC     Status: Abnormal   Collection Time: 09/19/17  2:06 PM  Result Value Ref Range   WBC 7.4 4.0 - 10.5 K/uL   RBC 4.20 3.87 - 5.11 MIL/uL   Hemoglobin 9.6 (L) 12.0 - 15.0 g/dL   HCT 32.7 (L) 36.0 - 46.0 %   MCV 77.9 (L) 78.0 - 100.0 fL   MCH 22.9 (L) 26.0 -  34.0 pg   MCHC 29.4 (L) 30.0 - 36.0 g/dL   RDW 19.5 (H) 11.5 - 15.5 %   Platelets 227 150 - 400 K/uL  MRSA PCR Screening     Status: Abnormal   Collection Time: 09/20/17 12:47 AM  Result Value Ref Range   MRSA by PCR POSITIVE (A) NEGATIVE    Comment: RESULT CALLED TO, READ BACK BY AND VERIFIED WITH: TETREAULT,H '@0336'$  BY MATTHEWS, B 10.17.18        The GeneXpert MRSA Assay (FDA approved for NASAL specimens only), is one component of a comprehensive MRSA colonization surveillance program. It is not intended to diagnose MRSA infection nor to guide or monitor treatment for MRSA infections.   TSH     Status: None   Collection Time: 09/20/17  4:34 AM  Result Value Ref Range   TSH 0.856 0.350 - 4.500 uIU/mL    Comment: Performed by a 3rd Generation assay with a functional sensitivity of <=0.01 uIU/mL.  Basic metabolic panel     Status: Abnormal   Collection Time: 09/20/17  4:34 AM  Result Value Ref Range   Sodium  141 135 - 145 mmol/L   Potassium 3.8 3.5 - 5.1 mmol/L   Chloride 105 101 - 111 mmol/L   CO2 24 22 - 32 mmol/L   Glucose, Bld 117 (H) 65 - 99 mg/dL   BUN 22 (H) 6 - 20 mg/dL   Creatinine, Ser 1.15 (H) 0.44 - 1.00 mg/dL   Calcium 9.9 8.9 - 10.3 mg/dL   GFR calc non Af Amer 44 (L) >60 mL/min   GFR calc Af Amer 51 (L) >60 mL/min    Comment: (NOTE) The eGFR has been calculated using the CKD EPI equation. This calculation has not been validated in all clinical situations. eGFR's persistently <60 mL/min signify possible Chronic Kidney Disease.    Anion gap 12 5 - 15  CBC     Status: Abnormal   Collection Time: 09/20/17  4:34 AM  Result Value Ref Range   WBC 7.8 4.0 - 10.5 K/uL   RBC 4.23 3.87 - 5.11 MIL/uL   Hemoglobin 9.7 (L) 12.0 - 15.0 g/dL   HCT 32.1 (L) 36.0 - 46.0 %   MCV 75.9 (L) 78.0 - 100.0 fL   MCH 22.9 (L) 26.0 - 34.0 pg   MCHC 30.2 30.0 - 36.0 g/dL   RDW 19.3 (H) 11.5 - 15.5 %   Platelets 268 150 - 400 K/uL  Ammonia     Status: None   Collection Time: 09/20/17  4:34 AM  Result Value Ref Range   Ammonia 22 9 - 35 umol/L    ABGS No results for input(s): PHART, PO2ART, TCO2, HCO3 in the last 72 hours.  Invalid input(s): PCO2 CULTURES Recent Results (from the past 240 hour(s))  MRSA PCR Screening     Status: Abnormal   Collection Time: 09/20/17 12:47 AM  Result Value Ref Range Status   MRSA by PCR POSITIVE (A) NEGATIVE Final    Comment: RESULT CALLED TO, READ BACK BY AND VERIFIED WITH: TETREAULT,H '@0336'$  BY MATTHEWS, B 10.17.18        The GeneXpert MRSA Assay (FDA approved for NASAL specimens only), is one component of a comprehensive MRSA colonization surveillance program. It is not intended to diagnose MRSA infection nor to guide or monitor treatment for MRSA infections.    Studies/Results: Dg Chest 2 View  Result Date: 09/19/2017 CLINICAL DATA:  Increasing level of confusion over the last 3 days,  cough EXAM: CHEST  2 VIEW COMPARISON:  Chest x-ray of  08/29/2016 FINDINGS: There are slightly prominent markings at the right lung base suspicious for developing pneumonia. The left lung appears clear. No pleural effusion is seen. Moderate cardiomegaly is stable. 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 IMPRESSION: Slightly prominent markings at the right lung base suspicious for developing pneumonia. Recommend followup. Electronically Signed   By: Ivar Drape M.D.   On: 09/19/2017 16:26   Ct Head Wo Contrast  Result Date: 09/19/2017 CLINICAL DATA:  76 year old hypertensive female with confusion. No known injury. Initial encounter. EXAM: CT HEAD WITHOUT CONTRAST TECHNIQUE: Contiguous axial images were obtained from the base of the skull through the vertex without intravenous contrast. COMPARISON:  09/24/2016 head CT. FINDINGS: Brain: No intracranial hemorrhage or CT evidence of large acute infarct. Remote left thalamic infarct. Moderate chronic microvascular changes. Global atrophy without hydrocephalus. No intracranial mass lesion noted on this unenhanced exam. Vascular: Prominent vascular calcifications. Skull: No acute abnormality. Sinuses/Orbits: Exophthalmos. Visualized paranasal sinuses are clear. Other: Mastoid air cells and middle ear cavities are clear. IMPRESSION: No intracranial hemorrhage or CT evidence of large acute infarct. Remote left thalamic infarct. Moderate chronic microvascular changes. Global atrophy. Electronically Signed   By: Genia Del M.D.   On: 09/19/2017 16:15    Medications:  Prior to Admission:  Prescriptions Prior to Admission  Medication Sig Dispense Refill Last Dose  . acetaminophen (TYLENOL) 325 MG tablet Take 325-650 mg by mouth every 6 (six) hours as needed for mild pain, moderate pain or headache.    unknown  . apixaban (ELIQUIS) 5 MG TABS tablet Take 5 mg by mouth 2 (two) times daily.    09/19/2017 at [place]  . aspirin EC 81 MG tablet Take 81 mg by mouth daily.   09/19/2017 at Unknown time  .  atorvastatin (LIPITOR) 40 MG tablet Take 40 mg by mouth at bedtime.   09/18/2017 at Unknown time  . benazepril (LOTENSIN) 40 MG tablet Take 40 mg by mouth daily.    09/19/2017 at Unknown time  . ferrous sulfate 325 (65 FE) MG tablet Take 325 mg by mouth daily with breakfast.   09/19/2017 at Unknown time  . furosemide (LASIX) 40 MG tablet Take 40 mg by mouth daily as needed for fluid.   unknown  . gabapentin (NEURONTIN) 100 MG capsule Take 200 mg by mouth at bedtime.   09/18/2017 at Unknown time  . ibuprofen (ADVIL,MOTRIN) 200 MG tablet Take 600 mg by mouth every 6 (six) hours as needed for headache, mild pain or moderate pain.   unknown  . levETIRAcetam (KEPPRA) 500 MG tablet Take 500 mg by mouth 2 (two) times daily.   09/19/2017 at 500a  . lubiprostone (AMITIZA) 24 MCG capsule Take 24 mcg by mouth 2 (two) times daily with a meal.   09/19/2017 at Unknown time  . Melatonin 3 MG TABS Take 3 mg by mouth at bedtime as needed (for sleep).   unknown  . metFORMIN (GLUCOPHAGE) 500 MG tablet Take 500 mg by mouth 2 (two) times daily with a meal.    09/19/2017 at Unknown time  . metoprolol tartrate (LOPRESSOR) 25 MG tablet Take 12.5 mg by mouth 2 (two) times daily.    09/19/2017 at [place]  . nitroGLYCERIN (NITROSTAT) 0.4 MG SL tablet Place 1 tablet (0.4 mg total) under the tongue every 5 (five) minutes as needed for chest pain. 25 tablet 1 unknown  . omega-3 acid ethyl esters (LOVAZA) 1  G capsule Take 2 g by mouth 2 (two) times daily.    09/19/2017 at Unknown time  . ondansetron (ZOFRAN-ODT) 4 MG disintegrating tablet Take 4 mg by mouth every 8 (eight) hours as needed for nausea or vomiting.   unknown  . pantoprazole (PROTONIX) 40 MG tablet Take 1 tablet (40 mg total) by mouth daily. 30 tablet 3 09/19/2017 at Unknown time  . potassium chloride (K-DUR,KLOR-CON) 10 MEQ tablet Take 10 mEq by mouth daily.   09/19/2017 at Unknown time  . senna-docusate (SENEXON-S) 8.6-50 MG tablet Take 1 tablet by mouth 2 (two) times  daily.   09/19/2017 at Unknown time  . traZODone (DESYREL) 50 MG tablet Take 50 mg by mouth at bedtime.   09/18/2017 at Unknown time   Scheduled: . apixaban  5 mg Oral BID  . aspirin EC  81 mg Oral Daily  . atorvastatin  40 mg Oral QHS  . Chlorhexidine Gluconate Cloth  6 each Topical Q0600  . ferrous sulfate  325 mg Oral Q breakfast  . [START ON 09/21/2017] Influenza vac split quadrivalent PF  0.5 mL Intramuscular Tomorrow-1000  . ketorolac  1 drop Left Eye QID  . levETIRAcetam  500 mg Oral BID  . lubiprostone  24 mcg Oral BID WC  . metoprolol tartrate  12.5 mg Oral BID  . mupirocin ointment  1 application Nasal BID  . omega-3 acid ethyl esters  2 g Oral BID  . pantoprazole  40 mg Oral Daily  . senna-docusate  1 tablet Oral BID   Continuous: . azithromycin 500 mg (09/19/17 1815)   YLT:[id number] **OR** acetaminophen, ondansetron **OR** ondansetron (ZOFRAN) IV  Assesment: She was admitted with pneumonia. She is confused which is frequent situation when she gets sick. She has chronic atrial fib which is stable. She has chronic diastolic heart failure which is stable Active Problems:   Chronic diastolic heart failure (HCC)   Hypertension   Chronic kidney disease (CKD), stage III (moderate) (HCC)   Protein-calorie malnutrition, severe (HCC)   Atrial fibrillation (HCC)   Lobar pneumonia (HCC)   Acute metabolic encephalopathy    Plan: continue IV antibiotics    LOS: 1 day   Advika Mclelland L 09/20/2017, 8:42 AM

## 2017-09-20 NOTE — Evaluation (Signed)
Physical Therapy Evaluation Patient Details Name: [name] MRN: [id number] DOB: [birth year] Today's Date: 09/20/2017   History of Present Illness  [name] is a 76 y.o. female with medical history of essential hypertension, diabetes mellitus, hyperlipidemia, atrial fibrillation, CKD stage III presents with confusion that began on 09/17/2017. The patient cannot provide any significant history secondary to encephalopathy. All this history is obtained from speaking with the patient's daughter and reviewed the medical record. At baseline, the patient is alert and oriented 4 and independent with her activities of daily living. The patient does complain of some shortness of breath and nonproductivecoughing for the past couple days. She denies any headache, chest pain, nausea, vomiting, diarrhea, abdominal pain, dysuria, hematuria, hematochezia, melena. There is not been any new medications started.   Clinical Impression  Patient received in bed, very pleasant and very willing to participate in skilled PT service today but A&Ox2 this morning. Patient able to complete bed mobility, functional transfers, and gait with supervision to min guard, however does require cues for correct placement of quad cane today as well as general cues for safety in general. Patient is moving quite well overall and scores well on MMT testing but would benefit from strength training. Patient left in bed with alarm activated, all needs met and concerns addressed. Recommend HHPT moving forward.     Follow Up Recommendations Home health PT    Equipment Recommendations  None recommended by PT    Recommendations for Other Services       Precautions / Restrictions Precautions Precautions: Fall Restrictions Weight Bearing Restrictions: No      Mobility  Bed Mobility Overal bed mobility: Needs Assistance Bed Mobility: Supine to Sit;Sit to Supine     Supine to sit: Supervision Sit to supine: Supervision       Transfers Overall transfer level: Needs assistance Equipment used: Quad cane Transfers: Sit to/from Stand Sit to Stand: Supervision            Ambulation/Gait Ambulation/Gait assistance: Supervision Ambulation Distance (Feet): 30 Feet Assistive device: Quad cane Gait Pattern/deviations: Step-through pattern;Narrow base of support;Trunk flexed     General Gait Details: cues for use of cane including correct side that cane is on during gait   Stairs            Wheelchair Mobility    Modified Rankin (Stroke Patients Only)       Balance Overall balance assessment: Needs assistance Sitting-balance support: No upper extremity supported Sitting balance-Leahy Scale: Good     Standing balance support: Single extremity supported Standing balance-Leahy Scale: Fair                               Pertinent Vitals/Pain Pain Assessment: 0-10 Pain Score: 3  Pain Location: stomach  Pain Descriptors / Indicators: Aching Pain Intervention(s): Limited activity within patient's tolerance;Monitored during session    [place] expects to be discharged to:: Private residence Living Arrangements: Children Available Help at Discharge: Family Type of Home: House Home Access: Level entry     Home Layout: One level   Additional Comments: patient confused and not giving accurate information regarding home equipment, states "I have 5 different types of canes"...limited information as to above     Prior Function Level of Independence: Independent with assistive device(s)               Hand Dominance        Extremity/Trunk  Assessment   Upper Extremity Assessment Upper Extremity Assessment: Overall WFL for tasks assessed    Lower Extremity Assessment Lower Extremity Assessment: Generalized weakness    Cervical / Trunk Assessment Cervical / Trunk Assessment: Kyphotic  Communication   Communication: No difficulties  Cognition  Arousal/Alertness: Awake/alert Behavior During Therapy: WFL for tasks assessed/performed Overall Cognitive Status: Within Functional Limits for tasks assessed                                        General Comments      Exercises     Assessment/Plan    PT Assessment Patient needs continued PT services  PT Problem List Decreased strength;Decreased coordination;Decreased safety awareness;Decreased balance       PT Treatment Interventions DME instruction;Therapeutic activities;Gait training;Therapeutic exercise;Patient/family education;Stair training;Balance training;Functional mobility training;Neuromuscular re-education    PT Goals (Current goals can be found in the Care Plan section)  Acute Rehab PT Goals Patient Stated Goal: to go home  PT Goal Formulation: With patient Time For Goal Achievement: 10/04/17 Potential to Achieve Goals: Good    Frequency Min 3X/week   Barriers to discharge        Co-evaluation               AM-PAC PT "6 Clicks" Daily Activity  Outcome Measure Difficulty turning over in bed (including adjusting bedclothes, sheets and blankets)?: None Difficulty moving from lying on back to sitting on the side of the bed? : None Difficulty sitting down on and standing up from a chair with arms (e.g., wheelchair, bedside commode, etc,.)?: None Help needed moving to and from a bed to chair (including a wheelchair)?: None Help needed walking in hospital room?: A Little Help needed climbing 3-5 steps with a railing? : A Little 6 Click Score: 22    End of Session Equipment Utilized During Treatment: Gait belt Activity Tolerance: Patient tolerated treatment well Patient left: in bed;with call bell/phone within reach;with bed alarm set   PT Visit Diagnosis: Unsteadiness on feet (R26.81);Muscle weakness (generalized) (M62.81);History of falling (Z91.81)    Time: 7989-2119 PT Time Calculation (min) (ACUTE ONLY): 27 min   Charges:   PT  Evaluation $PT Eval Low Complexity: 1 Low PT Treatments $Self Care/Home Management: 8-22   PT G Codes:   PT G-Codes **NOT FOR INPATIENT CLASS** Functional Assessment Tool Used: AM-PAC 6 Clicks Basic Mobility;Clinical judgement Functional Limitation: Mobility: Walking and moving around Mobility: Walking and Moving Around Current Status (E1740): At least 20 percent but less than 40 percent impaired, limited or restricted Mobility: Walking and Moving Around Goal Status [phone]): At least 1 percent but less than 20 percent impaired, limited or restricted    Deniece Ree PT, DPT [phone]

## 2017-09-21 MED ORDER — LORAZEPAM 0.5 MG PO TABS
0.5000 mg | ORAL_TABLET | ORAL | Status: DC | PRN
Start: 2017-09-21 — End: 2017-09-25
  Administered 2017-09-22 – 2017-09-23 (×3): 0.5 mg via ORAL
  Filled 2017-09-21 (×3): qty 1

## 2017-09-21 MED ORDER — LORAZEPAM 2 MG/ML IJ SOLN
0.5000 mg | INTRAMUSCULAR | Status: DC | PRN
  Administered 2017-09-21 – 2017-09-22 (×2): 0.5 mg via INTRAMUSCULAR
  Filled 2017-09-21 (×2): qty 1

## 2017-09-21 NOTE — Progress Notes (Signed)
Pt deaccessed port, port then reaccessed with blood return by Dia CrawfordLisa Russell, RN. Mitts placed on pt for safety.

## 2017-09-21 NOTE — Progress Notes (Signed)
Subjective: She says she feels better. She has a little bit less confusion. No complaints of breathing trouble.  Objective: Vital signs in last 24 hours: Temp:  [98.2 F (36.8 C)-98.9 F (37.2 C)] 98.9 F (37.2 C) (10/18 0640) Pulse Rate:  [74-98] 74 (10/18 0640) Resp:  [20] 20 (10/18 0640) BP: (122-194)/(65-109) 194/109 (10/18 0640) SpO2:  [97 %-99 %] 97 % (10/18 0640) Weight change:  Last BM Date:  (unknown)  Intake/Output from previous day: 10/17 0701 - 10/18 0700 In: 970 [P.O.:720; IV Piggyback:250] Out: 900 [Urine:900]  PHYSICAL EXAM General appearance: alert, cooperative and no distress Resp: she has rhonchi bilaterally right more than left Cardio: irregularly irregular rhythm GI: soft, non-tender; bowel sounds normal; no masses,  no organomegaly Extremities: extremities normal, atraumatic, no cyanosis or edema still mildly confused  Lab Results:  Results for orders placed or performed during the hospital encounter of 09/19/17 (from the past 48 hour(s))  Urinalysis, Routine w reflex microscopic     Status: Abnormal   Collection Time: 09/19/17  1:59 PM  Result Value Ref Range   Color, Urine STRAW (A) YELLOW   APPearance CLEAR CLEAR   Specific Gravity, Urine 1.006 1.005 - 1.030   pH 5.0 5.0 - 8.0   Glucose, UA NEGATIVE NEGATIVE mg/dL   Hgb urine dipstick NEGATIVE NEGATIVE   Bilirubin Urine NEGATIVE NEGATIVE   Ketones, ur NEGATIVE NEGATIVE mg/dL   Protein, ur NEGATIVE NEGATIVE mg/dL   Nitrite NEGATIVE NEGATIVE   Leukocytes, UA NEGATIVE NEGATIVE  CBG monitoring, ED     Status: Abnormal   Collection Time: 09/19/17  2:00 PM  Result Value Ref Range   Glucose-Capillary 128 (H) 65 - 99 mg/dL   Comment 1 Notify RN    Comment 2 Document in Chart   Comprehensive metabolic panel     Status: Abnormal   Collection Time: 09/19/17  2:06 PM  Result Value Ref Range   Sodium 138 135 - 145 mmol/L   Potassium 4.4 3.5 - 5.1 mmol/L   Chloride 106 101 - 111 mmol/L   CO2 19 (L)  22 - 32 mmol/L   Glucose, Bld 109 (H) 65 - 99 mg/dL   BUN 24 (H) 6 - 20 mg/dL   Creatinine, Ser 1.47 (H) 0.44 - 1.00 mg/dL   Calcium 9.6 8.9 - 10.3 mg/dL   Total Protein 7.7 6.5 - 8.1 g/dL   Albumin 3.7 3.5 - 5.0 g/dL   AST 34 15 - 41 U/L   ALT 27 14 - 54 U/L   Alkaline Phosphatase 80 38 - 126 U/L   Total Bilirubin 0.4 0.3 - 1.2 mg/dL   GFR calc non Af Amer 33 (L) >60 mL/min   GFR calc Af Amer 38 (L) >60 mL/min    Comment: (NOTE) The eGFR has been calculated using the CKD EPI equation. This calculation has not been validated in all clinical situations. eGFR's persistently <60 mL/min signify possible Chronic Kidney Disease.    Anion gap 13 5 - 15  CBC     Status: Abnormal   Collection Time: 09/19/17  2:06 PM  Result Value Ref Range   WBC 7.4 4.0 - 10.5 K/uL   RBC 4.20 3.87 - 5.11 MIL/uL   Hemoglobin 9.6 (L) 12.0 - 15.0 g/dL   HCT 32.7 (L) 36.0 - 46.0 %   MCV 77.9 (L) 78.0 - 100.0 fL   MCH 22.9 (L) 26.0 - 34.0 pg   MCHC 29.4 (L) 30.0 - 36.0 g/dL   RDW 19.5 (  H) 11.5 - 15.5 %   Platelets 227 150 - 400 K/uL  MRSA PCR Screening     Status: Abnormal   Collection Time: 09/20/17 12:47 AM  Result Value Ref Range   MRSA by PCR POSITIVE (A) NEGATIVE    Comment: RESULT CALLED TO, READ BACK BY AND VERIFIED WITH: TETREAULT,H _0  BY MATTHEWS, B 10.17.18        The GeneXpert MRSA Assay (FDA approved for NASAL specimens only), is one component of a comprehensive MRSA colonization surveillance program. It is not intended to diagnose MRSA infection nor to guide or monitor treatment for MRSA infections.   TSH     Status: None   Collection Time: 09/20/17  4:34 AM  Result Value Ref Range   TSH 0.856 0.350 - 4.500 uIU/mL    Comment: Performed by a 3rd Generation assay with a functional sensitivity of <=0.01 uIU/mL.  Basic metabolic panel     Status: Abnormal   Collection Time: 09/20/17  4:34 AM  Result Value Ref Range   Sodium 141 135 - 145 mmol/L   Potassium 3.8 3.5 - 5.1 mmol/L    Chloride 105 101 - 111 mmol/L   CO2 24 22 - 32 mmol/L   Glucose, Bld 117 (H) 65 - 99 mg/dL   BUN 22 (H) 6 - 20 mg/dL   Creatinine, Ser 1.15 (H) 0.44 - 1.00 mg/dL   Calcium 9.9 8.9 - 10.3 mg/dL   GFR calc non Af Amer 44 (L) >60 mL/min   GFR calc Af Amer 51 (L) >60 mL/min    Comment: (NOTE) The eGFR has been calculated using the CKD EPI equation. This calculation has not been validated in all clinical situations. eGFR's persistently <60 mL/min signify possible Chronic Kidney Disease.    Anion gap 12 5 - 15  CBC     Status: Abnormal   Collection Time: 09/20/17  4:34 AM  Result Value Ref Range   WBC 7.8 4.0 - 10.5 K/uL   RBC 4.23 3.87 - 5.11 MIL/uL   Hemoglobin 9.7 (L) 12.0 - 15.0 g/dL   HCT 32.1 (L) 36.0 - 46.0 %   MCV 75.9 (L) 78.0 - 100.0 fL   MCH 22.9 (L) 26.0 - 34.0 pg   MCHC 30.2 30.0 - 36.0 g/dL   RDW 19.3 (H) 11.5 - 15.5 %   Platelets 268 150 - 400 K/uL  Ammonia     Status: None   Collection Time: 09/20/17  4:34 AM  Result Value Ref Range   Ammonia 22 9 - 35 umol/L  Vitamin B12     Status: None   Collection Time: 09/20/17  4:34 AM  Result Value Ref Range   Vitamin B-12 485 180 - 914 pg/mL    Comment: (NOTE) This assay is not validated for testing neonatal or myeloproliferative syndrome specimens for Vitamin B12 levels. Performed at [facility], [place] [address], [place], [place] [address]   Hemoglobin A1c     Status: Abnormal   Collection Time: 09/20/17  4:34 AM  Result Value Ref Range   Hgb A1c MFr Bld 6.3 (H) 4.8 - 5.6 %    Comment: (NOTE) Pre diabetes:          5.7%-6.4% Diabetes:              >6.4% Glycemic control for   <7.0% adults with diabetes    Mean Plasma Glucose 134.11 mg/dL    Comment: Performed at [facility] [address],  [place], [place] [address]    ABGS No results for input(s): PHART, PO2ART, TCO2, HCO3 in the last 72 hours.  Invalid input(s): PCO2 CULTURES Recent Results (from the past 240 hour(s))  MRSA PCR  Screening     Status: Abnormal   Collection Time: 09/20/17 12:47 AM  Result Value Ref Range Status   MRSA by PCR POSITIVE (A) NEGATIVE Final    Comment: RESULT CALLED TO, READ BACK BY AND VERIFIED WITH: TETREAULT,H _0  BY MATTHEWS, B 10.17.18        The GeneXpert MRSA Assay (FDA approved for NASAL specimens only), is one component of a comprehensive MRSA colonization surveillance program. It is not intended to diagnose MRSA infection nor to guide or monitor treatment for MRSA infections.    Studies/Results: Dg Chest 2 View  Result Date: 09/19/2017 CLINICAL DATA:  Increasing level of confusion over the last 3 days, cough EXAM: CHEST  2 VIEW COMPARISON:  Chest x-ray of 08/29/2016 FINDINGS: There are slightly prominent markings at the right lung base suspicious for developing pneumonia. The left lung appears clear. No pleural effusion is seen. Moderate cardiomegaly is stable. 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 IMPRESSION: Slightly prominent markings at the right lung base suspicious for developing pneumonia. Recommend followup. Electronically Signed   By: Ivar Drape M.D.   On: 09/19/2017 16:26   Ct Head Wo Contrast  Result Date: 09/19/2017 CLINICAL DATA:  77 year old hypertensive female with confusion. No known injury. Initial encounter. EXAM: CT HEAD WITHOUT CONTRAST TECHNIQUE: Contiguous axial images were obtained from the base of the skull through the vertex without intravenous contrast. COMPARISON:  09/24/2016 head CT. FINDINGS: Brain: No intracranial hemorrhage or CT evidence of large acute infarct. Remote left thalamic infarct. Moderate chronic microvascular changes. Global atrophy without hydrocephalus. No intracranial mass lesion noted on this unenhanced exam. Vascular: Prominent vascular calcifications. Skull: No acute abnormality. Sinuses/Orbits: Exophthalmos. Visualized paranasal sinuses are clear. Other: Mastoid air cells and middle ear cavities are  clear. IMPRESSION: No intracranial hemorrhage or CT evidence of large acute infarct. Remote left thalamic infarct. Moderate chronic microvascular changes. Global atrophy. Electronically Signed   By: Genia Del M.D.   On: 09/19/2017 16:15    Medications:  Prior to Admission:  Prescriptions Prior to Admission  Medication Sig Dispense Refill Last Dose  . acetaminophen (TYLENOL) 325 MG tablet Take 325-650 mg by mouth every 6 (six) hours as needed for mild pain, moderate pain or headache.    unknown  . apixaban (ELIQUIS) 5 MG TABS tablet Take 5 mg by mouth 2 (two) times daily.    09/19/2017 at [facility]  . aspirin EC 81 MG tablet Take 81 mg by mouth daily.   09/19/2017 at Unknown time  . atorvastatin (LIPITOR) 40 MG tablet Take 40 mg by mouth at bedtime.   09/18/2017 at Unknown time  . benazepril (LOTENSIN) 40 MG tablet Take 40 mg by mouth daily.    09/19/2017 at Unknown time  . ferrous sulfate 325 (65 FE) MG tablet Take 325 mg by mouth daily with breakfast.   09/19/2017 at Unknown time  . furosemide (LASIX) 40 MG tablet Take 40 mg by mouth daily as needed for fluid.   unknown  . gabapentin (NEURONTIN) 100 MG capsule Take 200 mg by mouth at bedtime.   09/18/2017 at Unknown time  . ibuprofen (ADVIL,MOTRIN) 200 MG tablet Take 600 mg by mouth every 6 (six) hours as needed for headache, mild pain or moderate pain.   unknown  .  levETIRAcetam (KEPPRA) 500 MG tablet Take 500 mg by mouth 2 (two) times daily.   09/19/2017 at 500a  . lubiprostone (AMITIZA) 24 MCG capsule Take 24 mcg by mouth 2 (two) times daily with a meal.   09/19/2017 at Unknown time  . Melatonin 3 MG TABS Take 3 mg by mouth at bedtime as needed (for sleep).   unknown  . metFORMIN (GLUCOPHAGE) 500 MG tablet Take 500 mg by mouth 2 (two) times daily with a meal.    09/19/2017 at Unknown time  . metoprolol tartrate (LOPRESSOR) 25 MG tablet Take 12.5 mg by mouth 2 (two) times daily.    09/19/2017 at [place]  . nitroGLYCERIN (NITROSTAT) 0.4 MG SL  tablet Place 1 tablet (0.4 mg total) under the tongue every 5 (five) minutes as needed for chest pain. 25 tablet 1 unknown  . omega-3 acid ethyl esters (LOVAZA) 1 G capsule Take 2 g by mouth 2 (two) times daily.    09/19/2017 at Unknown time  . ondansetron (ZOFRAN-ODT) 4 MG disintegrating tablet Take 4 mg by mouth every 8 (eight) hours as needed for nausea or vomiting.   unknown  . pantoprazole (PROTONIX) 40 MG tablet Take 1 tablet (40 mg total) by mouth daily. 30 tablet 3 09/19/2017 at Unknown time  . potassium chloride (K-DUR,KLOR-CON) 10 MEQ tablet Take 10 mEq by mouth daily.   09/19/2017 at Unknown time  . senna-docusate (SENEXON-S) 8.6-50 MG tablet Take 1 tablet by mouth 2 (two) times daily.   09/19/2017 at Unknown time  . traZODone (DESYREL) 50 MG tablet Take 50 mg by mouth at bedtime.   09/18/2017 at Unknown time   Scheduled: . apixaban  5 mg Oral BID  . aspirin EC  81 mg Oral Daily  . atorvastatin  40 mg Oral QHS  . Chlorhexidine Gluconate Cloth  6 each Topical Q0600  . ferrous sulfate  325 mg Oral Q breakfast  . Influenza vac split quadrivalent PF  0.5 mL Intramuscular Tomorrow-1000  . ketorolac  1 drop Left Eye QID  . levETIRAcetam  500 mg Oral BID  . lubiprostone  24 mcg Oral BID WC  . metoprolol tartrate  12.5 mg Oral BID  . mupirocin ointment  1 application Nasal BID  . omega-3 acid ethyl esters  2 g Oral BID  . pantoprazole  40 mg Oral Daily  . senna-docusate  1 tablet Oral BID   Continuous: . azithromycin Stopped (09/20/17 1847)   VEH:[id number] **OR** acetaminophen, ondansetron **OR** ondansetron (ZOFRAN) IV  Assesment:she was admitted with altered mental status likely related to right lower lobe pneumonia. She is better. This is an acute metabolic encephalopathy. She is on IV antibiotics. She is improving Active Problems:   Chronic diastolic heart failure (HCC)   Hypertension   Chronic kidney disease (CKD), stage III (moderate) (HCC)   Protein-calorie  malnutrition, severe (HCC)   Atrial fibrillation ([place])   Lobar pneumonia ([place])   Acute metabolic encephalopathy    Plan:I think she probably will be okay to be discharged tomorrow    LOS: 2 days   Georgeana Oertel L 09/21/2017, 8:49 AM

## 2017-09-22 NOTE — Care Management Note (Signed)
Case Management Note  Patient Details  Name: [name] MRN: [id number] Date of Birth: [birth year]  Subjective/Objective:                 Admitted with pneumonia. Pt A&O. Pt lives at home with dtr, [place]le [place] and granddaughter. Pt is ind with ALD's. [facility], she is agreeable and requests [facility]. Aware HH has 48 hrs to make first visit. Pt communicates no further needs or concerns about DC.    Action/Plan[facility] Bonita QuinLinda, [facility], aware of referral and will pull pt info form chart RN will [facility] when [facility].   Expected Discharge Date:      09/23/2017            Expected Discharge Plan:  Home w Home Health Services  In-House Referral:  NA  Discharge planning Services  CM Consult  Post Acute Care Choice:  Home Health Choice offered to:  Patient  HH Arr[facility]:  [facility]  Status of Service:  Completed, signed off  Malcolm MetroChildress, Maayan Jenning Demske, RN 09/22/2017, 3:42 PM

## 2017-09-22 NOTE — Progress Notes (Signed)
Subjective: Yesterday morning she seemed to be doing well with her confusion but then later in the day she became very confused pulled all of her IVs monitors etc. out packed up her belongings and said she was going home. She responded to some Ativan . She is less confused this morning but says she is more short of breath.  Objective: Vital signs in last 24 hours: Temp:  [97.6 F (36.4 C)-98.6 F (37 C)] 97.6 F (36.4 C) (10/19 0510) Pulse Rate:  [63-85] 63 (10/19 0510) Resp:  [18] 18 (10/19 0510) BP: (133-163)/(70-94) 151/85 (10/19 0510) SpO2:  [96 %-97 %] 97 % (10/19 0510) Weight change:  Last BM Date:  (unknown)  Intake/Output from previous day: 10/18 0701 - 10/19 0700 In: 730 [P.O.:480; IV Piggyback:250] Out: -   PHYSICAL EXAM General appearance: alert, cooperative and mild distress Resp: She has bilateral rhonchi more on the right than on the left Cardio: irregularly irregular rhythm GI: soft, non-tender; bowel sounds normal; no masses,  no organomegaly Extremities: extremities normal, atraumatic, no cyanosis or edema Still mildly confused but less than when she was so agitated yesterday  Lab Results:  No results found for this or any previous visit (from the past 48 hour(s)).  ABGS No results for input(s): PHART, PO2ART, TCO2, HCO3 in the last 72 hours.  Invalid input(s): PCO2 CULTURES Recent Results (from the past 240 hour(s))  MRSA PCR Screening     Status: Abnormal   Collection Time: 09/20/17 12:47 AM  Result Value Ref Range Status   MRSA by PCR POSITIVE (A) NEGATIVE Final    Comment: RESULT CALLED TO, READ BACK BY AND VERIFIED WITH: TETREAULT,H @0336  BY MATTHEWS, B 10.17.18        The GeneXpert MRSA Assay (FDA approved for NASAL specimens only), is one component of a comprehensive MRSA colonization surveillance program. It is not intended to diagnose MRSA infection nor to guide or monitor treatment for MRSA infections.    Studies/Results: No results  found.  Medications:  Prior to Admission:  Prescriptions Prior to Admission  Medication Sig Dispense Refill Last Dose  . acetaminophen (TYLENOL) 325 MG tablet Take 325-650 mg by mouth every 6 (six) hours as needed for mild pain, moderate pain or headache.    unknown  . apixaban (ELIQUIS) 5 MG TABS tablet Take 5 mg by mouth 2 (two) times daily.    09/19/2017 at 500[facility]  . aspirin EC 81 MG tablet Take 81 mg by mouth daily.   09/19/2017 at Unknown time  . atorvastatin (LIPITOR) 40 MG tablet Take 40 mg by mouth at bedtime.   09/18/2017 at Unknown time  . benazepril (LOTENSIN) 40 MG tablet Take 40 mg by mouth daily.    09/19/2017 at Unknown time  . ferrous sulfate 325 (65 FE) MG tablet Take 325 mg by mouth daily with breakfast.   09/19/2017 at Unknown time  . furosemide (LASIX) 40 MG tablet Take 40 mg by mouth daily as needed for fluid.   unknown  . gabapentin (NEURONTIN) 100 MG capsule Take 200 mg by mouth at bedtime.   09/18/2017 at Unknown time  . ibuprofen (ADVIL,MOTRIN) 200 MG tablet Take 600 mg by mouth every 6 (six) hours as needed for headache, mild pain or moderate pain.   unknown  . levETIRAcetam (KEPPRA) 500 MG tablet Take 500 mg by mouth 2 (two) times daily.   09/19/2017 at 500a  . lubiprostone (AMITIZA) 24 MCG capsule Take 24 mcg by mouth 2 (two) times daily with  a meal.   09/19/2017 at Unknown time  . Melatonin 3 MG TABS Take 3 mg by mouth at bedtime as needed (for sleep).   unknown  . metFORMIN (GLUCOPHAGE) 500 MG tablet Take 500 mg by mouth 2 (two) times daily with a meal.    09/19/2017 at Unknown time  . metoprolol tartrate (LOPRESSOR) 25 MG tablet Take 12.5 mg by mouth 2 (two) times daily.    09/19/2017 at 500a  . nitroGLYCERIN (NITROSTAT) 0.4 MG SL tablet Place 1 tablet (0.4 mg total) under the tongue every 5 (five) minutes as needed for chest pain. 25 tablet 1 unknown  . omega-3 acid ethyl esters (LOVAZA) 1 G capsule Take 2 g by mouth 2 (two) times daily.    09/19/2017 at Unknown  time  . ondansetron (ZOFRAN-ODT) 4 MG disintegrating tablet Take 4 mg by mouth every 8 (eight) hours as needed for nausea or vomiting.   unknown  . pantoprazole (PROTONIX) 40 MG tablet Take 1 tablet (40 mg total) by mouth daily. 30 tablet 3 09/19/2017 at Unknown time  . potassium chloride (K-DUR,KLOR-CON) 10 MEQ tablet Take 10 mEq by mouth daily.   09/19/2017 at Unknown time  . senna-docusate (SENEXON-S) 8.6-50 MG tablet Take 1 tablet by mouth 2 (two) times daily.   09/19/2017 at Unknown time  . traZODone (DESYREL) 50 MG tablet Take 50 mg by mouth at bedtime.   09/18/2017 at Unknown time   Scheduled: . apixaban  5 mg Oral BID  . aspirin EC  81 mg Oral Daily  . atorvastatin  40 mg Oral QHS  . Chlorhexidine Gluconate Cloth  6 each Topical Q0600  . ferrous sulfate  325 mg Oral Q breakfast  . Influenza vac split quadrivalent PF  0.5 mL Intramuscular Tomorrow-1000  . ketorolac  1 drop Left Eye QID  . levETIRAcetam  500 mg Oral BID  . lubiprostone  24 mcg Oral BID WC  . metoprolol tartrate  12.5 mg Oral BID  . mupirocin ointment  1 application Nasal BID  . omega-3 acid ethyl esters  2 g Oral BID  . pantoprazole  40 mg Oral Daily  . senna-docusate  1 tablet Oral BID   Continuous: . azithromycin Stopped (09/21/17 1852)   ZOX:[id number]:acetaminophen **OR** acetaminophen, LORazepam **OR** LORazepam, ondansetron **OR** ondansetron (ZOFRAN) IV  Assesment: She was admitted with pneumonia and acute metabolic encephalopathy from that. Her encephalopathy seemed to be improving but had become much worse yesterday. She seems better today. However this is the same scenario that she had yesterday when she developed very severe agitation and confusion. Active Problems:   Chronic diastolic heart failure (HCC)   Hypertension   Chronic kidney disease (CKD), stage III (moderate) (HCC)   Protein-calorie malnutrition, severe (HCC)   Atrial fibrillation (HCC)   Lobar pneumonia (HCC)   Acute metabolic  encephalopathy    Plan: Continue with IV antibiotics. Continue Ativan as needed. She is not ready for discharge    LOS: 3 days   Viridiana Spaid L 09/22/2017, 8:08 AM

## 2017-09-22 NOTE — Care Management Important Message (Signed)
Important Message  Patient Details  Name: Dayle PointsSilver P Archuletta MRN: [id number] Date of Birth: 04-10-1941   Medicare Important Message Given:  Yes    Malcolm MetroChildress, Ruhan Borak Demske, RN 09/22/2017, 3:41 PM

## 2017-09-23 LAB — BASIC METABOLIC PANEL
Anion gap: 11 (ref 5–15)
BUN: 38 mg/dL — AB (ref 6–20)
CALCIUM: 9.8 mg/dL (ref 8.9–10.3)
CO2: 22 mmol/L (ref 22–32)
CREATININE: 1.52 mg/dL — AB (ref 0.44–1.00)
Chloride: 104 mmol/L (ref 101–111)
GFR calc Af Amer: 37 mL/min — ABNORMAL LOW (ref >60)
GFR, EST NON AFRICAN AMERICAN: 32 mL/min — AB (ref >60)
GLUCOSE: 115 mg/dL — AB (ref 65–99)
Potassium: 4.1 mmol/L (ref 3.5–5.1)
Sodium: 137 mmol/L (ref 135–145)

## 2017-09-23 LAB — CBC WITH DIFFERENTIAL/PLATELET
BASOS ABS: 0.1 10*3/uL (ref 0.0–0.1)
Basophils Relative: 1 %
EOS PCT: 7 %
Eosinophils Absolute: 0.6 10*3/uL (ref 0.0–0.7)
HEMATOCRIT: 35.6 % — AB (ref 36.0–46.0)
Hemoglobin: 11 g/dL — ABNORMAL LOW (ref 12.0–15.0)
LYMPHS ABS: 2 10*3/uL (ref 0.7–4.0)
LYMPHS PCT: 25 %
MCH: 23.3 pg — AB (ref 26.0–34.0)
MCHC: 30.9 g/dL (ref 30.0–36.0)
MCV: 75.3 fL — AB (ref 78.0–100.0)
MONO ABS: 0.9 10*3/uL (ref 0.1–1.0)
MONOS PCT: 11 %
Neutro Abs: 4.2 10*3/uL (ref 1.7–7.7)
Neutrophils Relative %: 55 %
PLATELETS: 269 10*3/uL (ref 150–400)
RBC: 4.73 MIL/uL (ref 3.87–5.11)
RDW: 19.4 % — AB (ref 11.5–15.5)
WBC: 7.7 10*3/uL (ref 4.0–10.5)

## 2017-09-23 LAB — SEDIMENTATION RATE: Sed Rate: 13 mm/hr (ref 0–22)

## 2017-09-23 MED ORDER — LORAZEPAM 2 MG/ML IJ SOLN: 0.5000 mg | Freq: Once | INTRAMUSCULAR | Status: AC

## 2017-09-23 MED ORDER — LORAZEPAM 1 MG PO TABS: 2.0000 mg | ORAL_TABLET | Freq: Four times a day (QID) | ORAL | Status: DC | PRN

## 2017-09-23 MED ORDER — LORAZEPAM 2 MG/ML IJ SOLN
INTRAMUSCULAR | Status: AC
  Administered 2017-09-23: 0.5 mg
  Filled 2017-09-23: qty 1

## 2017-09-23 MED ORDER — LORAZEPAM 2 MG/ML IJ SOLN
2.0000 mg | Freq: Four times a day (QID) | INTRAMUSCULAR | Status: DC | PRN
  Administered 2017-09-23 – 2017-09-24 (×4): 2 mg via INTRAMUSCULAR
  Filled 2017-09-23 (×4): qty 1

## 2017-09-23 NOTE — Progress Notes (Signed)
MD called and informed that patient had been given po Ativan but remained very agitated, orders given.

## 2017-09-23 NOTE — Progress Notes (Signed)
Patient was lobar infiltrate on IV antibiotics sitting in chair alert and oriented states she has no energy no get up and go she is in no respiratory distress. Apparently she became agitated yesterday [name] CYO:[id number] DOB: [birth year] DOA: 09/19/2017 PCP: Sinda Du, MD   Physical Exam: Blood pressure (!) 167/91, pulse 84, temperature 98.5 F (36.9 C), temperature source Oral, resp. rate 18, height '5\' 2"'$  (1.575 m), weight 84.8 kg (187 lb), SpO2 100 %. Lungs diminished breath sounds in bases no rales wheeze rhonchi appreciable heart regular rhythm no S3 or S4 no heaves thrills rubs   Investigations:  Recent Results (from the past 240 hour(s))  MRSA PCR Screening     Status: Abnormal   Collection Time: 09/20/17 12:47 AM  Result Value Ref Range Status   MRSA by PCR POSITIVE (A) NEGATIVE Final    Comment: RESULT CALLED TO, READ BACK BY AND VERIFIED WITH: TETREAULT,H '@0336'$  BY MATTHEWS, B 10.17.18        The GeneXpert MRSA Assay (FDA approved for NASAL specimens only), is one component of a comprehensive MRSA colonization surveillance program. It is not intended to diagnose MRSA infection nor to guide or monitor treatment for MRSA infections.      Basic Metabolic Panel: No results for input(s): NA, K, CL, CO2, GLUCOSE, BUN, CREATININE, CALCIUM, MG, PHOS in the last 72 hours. Liver Function Tests: No results for input(s): AST, ALT, ALKPHOS, BILITOT, PROT, ALBUMIN in the last 72 hours.   CBC: No results for input(s): WBC, NEUTROABS, HGB, HCT, MCV, PLT in the last 72 hours.  No results found.    Medications:   Impression:  Active Problems:   Chronic diastolic heart failure (HCC)   Hypertension   Chronic kidney disease (CKD), stage III (moderate) (HCC)   Protein-calorie malnutrition, severe (HCC)   Atrial fibrillation (HCC)   Lobar pneumonia (HCC)   Acute metabolic encephalopathy     Plan: Continue IV Zithromax. We'll do dementia panel today as well  as be met to see if there is any metabolic reasons for agitation yesterday  Consultants:    Procedures   Antibiotics: Zithromax          Time spent: 30 minutes   LOS: 4 days   Riggins Cisek M   09/23/2017, 11:59 AM

## 2017-09-23 NOTE — Progress Notes (Signed)
Pt bed alarm sounded, writer entered room and found pt on side of the bed naked. She had taken tele monitor off and had deaccessed her port. Pt reoriented to place and situation. She got back in bed and mittens were applied after explaining to her that they were to help keep her safe. Port was reaccessed in a sterile manner and tele reapplied. Pt is resting comfortably in bed at this time. No c/o pain or discomfort.

## 2017-09-23 NOTE — Progress Notes (Signed)
Bed alarm sounding, writer entered room and found pt naked in bed. She had pulled her tele monitor off and deaccessed her port again. Text MD on call. MD gave order to give Ativan IM.

## 2017-09-24 LAB — FOLATE: FOLATE: 29 ng/mL (ref >5.9)

## 2017-09-24 LAB — VITAMIN B12: VITAMIN B 12: 583 pg/mL (ref 180–914)

## 2017-09-24 LAB — RPR: RPR: NONREACTIVE

## 2017-09-24 MED ORDER — HYDRALAZINE HCL 20 MG/ML IJ SOLN
20.0000 mg | Freq: Four times a day (QID) | INTRAMUSCULAR | Status: DC | PRN
  Administered 2017-09-24: 20 mg via INTRAVENOUS
  Filled 2017-09-24: qty 1

## 2017-09-24 NOTE — Progress Notes (Signed)
PT lethargic from PRN ativan administered earlier during the day for agitation and combativeness. PO medications not given due to lethargy and inability to verbally respond. Possible aspiration risk if given. Continue to monitor.

## 2017-09-24 NOTE — Progress Notes (Signed)
Patient was agitated given by mouth Ativan now appears somnolent but arousable currently on IV antibiotics dementia workup ordered yesterday thyroid and RPR unrevealing thus far  Christine Gwyndolyn KaufmanP Benavidez ZOX:[id number] DOB: [birth year] DOA: 09/19/2017 PCP: Kari BaarsHawkins, Edward, MD   Physical Exam: Blood pressure (!) 178/122, pulse 91, temperature 98.4 F (36.9 C), temperature source Oral, resp. rate 18, height 5\' 2"  (1.575 m), weight 84.8 kg (187 lb), SpO2 100 %. Lungs appreciable heart regular rhythm no S3-S4 auscultated no heaves thrills or rubs regular rhythm no S3-S4 auscultated no heaves thrills or rubs heart appreciable no rales wheeze rhonchi  Lungs no rales wheeze or rhonchi   Investigations:  Recent Results (from the past 240 hour(s))  MRSA PCR Screening     Status: Abnormal   Collection Time: 09/20/17 12:47 AM  Result Value Ref Range Status   MRSA by PCR POSITIVE (A) NEGATIVE Final    Comment: RESULT CALLED TO, READ BACK BY AND VERIFIED WITH: TETREAULT,H @0336  BY MATTHEWS, B 10.17.18        The GeneXpert MRSA Assay (FDA approved for NASAL specimens only), is one component of a comprehensive MRSA colonization surveillance program. It is not intended to diagnose MRSA infection nor to guide or monitor treatment for MRSA infections.      Basic Metabolic Panel:  Recent Labs  81/19/1410/20/18 1241  NA 137  K 4.1  CL 104  CO2 22  GLUCOSE 115*  BUN 38*  CREATININE 1.52*  CALCIUM 9.8   Liver Function Tests: No results for input(s): AST, ALT, ALKPHOS, BILITOT, PROT, ALBUMIN in the last 72 hours.   CBC:  Recent Labs  09/23/17 1241  WBC 7.7  NEUTROABS 4.2  HGB 11.0*  HCT 35.6*  MCV 75.3*  PLT 269    No results found.    Medications:   Impression:  Active Problems:   Chronic diastolic heart failure (HCC)   Hypertension   Chronic kidney disease (CKD), stage III (moderate) (HCC)   Protein-calorie malnutrition, severe (HCC)   Atrial fibrillation (HCC)   Lobar pneumonia  (HCC)   Acute metabolic encephalopathy     Plan: Continue IV antibiotics await full results of dementia profile diminish benzo diazepam dosage benzo diazepam dosage if she continues to be agitated if she continues to be agitated  Consultants:     Procedures   Antibiotics:         Time spent: 30 minutes 30 minutes   LOS: 5 days   Mallary Kreger M   09/24/2017, 12:17 PM

## 2017-09-25 MED ORDER — LEVOFLOXACIN 500 MG PO TABS: 500.0000 mg | ORAL_TABLET | Freq: Every day | ORAL | 0 refills | Status: DC

## 2017-09-25 NOTE — Progress Notes (Signed)
Patient discharged home with personal belongings and prescriptions.  

## 2017-09-25 NOTE — Care Management (Addendum)
Discharging home today. Pt's dtr at bedside. Pt enrolled in [facility] transition calls. CM explained called and given flyer. Pt aware HH has 48 hrs to make first visit. [facility] rep updated on DC date. No further needs communicated.

## 2017-09-25 NOTE — Care Management Important Message (Signed)
Important Message  Patient Details  Name: Dayle PointsSilver P Mervine MRN: [id number] Date of Birth: Jun 16, 1941   Medicare Important Message Given:  Yes    Malcolm MetroChildress, Maelani Yarbro Demske, RN 09/25/2017, 9:22 AM

## 2017-09-25 NOTE — Discharge Summary (Signed)
Physician Discharge Summary  Patient ID: [name] MRN: [id number] DOB/AGE: [birth year] 76 y.o. Primary Care Physician:Shanicka Oldenkamp, Ramon Dredge, MD Admit date: 09/19/2017 Discharge date: 09/25/2017    Discharge Diagnoses:   Active Problems:   Chronic diastolic heart failure (HCC)   Hypertension   Chronic kidney disease (CKD), stage III (moderate) (HCC)   Protein-calorie malnutrition, severe (HCC)   Atrial fibrillation (HCC)   Lobar pneumonia (HCC)   Acute metabolic encephalopathy   Allergies as of 09/25/2017      Reactions   Codeine Other (See Comments)   Reaction:  Headaches    Food Nausea And Vomiting, Other (See Comments)   Pt is allergic to leafy raw vegetables and seeds.     Oxycodone Hcl Other (See Comments)   Reaction:  Headaches    Peanut-containing Drug Products Nausea And Vomiting   Reglan [metoclopramide] Other (See Comments)   Reaction:  Confusion       Medication List    TAKE these medications   acetaminophen 325 MG tablet Commonly known as:  TYLENOL Take 325-650 mg by mouth every 6 (six) hours as needed for mild pain, moderate pain or headache.   apixaban 5 MG Tabs tablet Commonly known as:  ELIQUIS Take 5 mg by mouth 2 (two) times daily.   aspirin EC 81 MG tablet Take 81 mg by mouth daily.   atorvastatin 40 MG tablet Commonly known as:  LIPITOR Take 40 mg by mouth at bedtime.   benazepril 40 MG tablet Commonly known as:  LOTENSIN Take 40 mg by mouth daily.   ferrous sulfate 325 (65 FE) MG tablet Take 325 mg by mouth daily with breakfast.   furosemide 40 MG tablet Commonly known as:  LASIX Take 40 mg by mouth daily as needed for fluid.   gabapentin 100 MG capsule Commonly known as:  NEURONTIN Take 200 mg by mouth at bedtime.   ibuprofen 200 MG tablet Commonly known as:  ADVIL,MOTRIN Take 600 mg by mouth every 6 (six) hours as needed for headache, mild pain or moderate pain.   levETIRAcetam 500 MG tablet Commonly known as:  KEPPRA Take  500 mg by mouth 2 (two) times daily.   levofloxacin 500 MG tablet Commonly known as:  LEVAQUIN Take 1 tablet (500 mg total) by mouth daily.   lubiprostone 24 MCG capsule Commonly known as:  AMITIZA Take 24 mcg by mouth 2 (two) times daily with a meal.   Melatonin 3 MG Tabs Take 3 mg by mouth at bedtime as needed (for sleep).   metFORMIN 500 MG tablet Commonly known as:  GLUCOPHAGE Take 500 mg by mouth 2 (two) times daily with a meal.   metoprolol tartrate 25 MG tablet Commonly known as:  LOPRESSOR Take 12.5 mg by mouth 2 (two) times daily.   nitroGLYCERIN 0.4 MG SL tablet Commonly known as:  NITROSTAT Place 1 tablet (0.4 mg total) under the tongue every 5 (five) minutes as needed for chest pain.   omega-3 acid ethyl esters 1 g capsule Commonly known as:  LOVAZA Take 2 g by mouth 2 (two) times daily.   ondansetron 4 MG disintegrating tablet Commonly known as:  ZOFRAN-ODT Take 4 mg by mouth every 8 (eight) hours as needed for nausea or vomiting.   pantoprazole 40 MG tablet Commonly known as:  PROTONIX Take 1 tablet (40 mg total) by mouth daily.   potassium chloride 10 MEQ tablet Commonly known as:  K-DUR,KLOR-CON Take 10 mEq by mouth daily.   SENEXON-S 8.6-50 MG  tablet Generic drug:  senna-docusate Take 1 tablet by mouth 2 (two) times daily.   traZODone 50 MG tablet Commonly known as:  DESYREL Take 50 mg by mouth at bedtime.       Discharged Condition:Improved    Consults: None  Significant Diagnostic Studies: Dg Chest 2 View  Result Date: 09/19/2017 CLINICAL DATA:  Increasing level of confusion over the last 3 days, cough EXAM: CHEST  2 VIEW COMPARISON:  Chest x-ray of 08/29/2016 FINDINGS: There are slightly prominent markings at the right lung base suspicious for developing pneumonia. The left lung appears clear. No pleural effusion is seen. Moderate cardiomegaly is stable. 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 IMPRESSION: Slightly  prominent markings at the right lung base suspicious for developing pneumonia. Recommend followup. Electronically Signed   By: Dwyane DeePaul  Barry M.D.   On: 09/19/2017 16:26   Dg Lumbar Spine Complete  Result Date: 09/01/2017 CLINICAL DATA:  Left hip pain . EXAM: LUMBAR SPINE - COMPLETE 4+ VIEW COMPARISON:  Lumbar spine 07/31/2015 . FINDINGS: Lumbar spine numbered as per prior study of 08/26/ 2016. L3-L4 and L4-L5 posterior and interbody fusion. Hardware intact. Lumbar spine scoliosis concave right. Bilateral hip degenerative change. Surgical clips right upper quadrant . IMPRESSION: L3-L4 and L4-L5 posterior and interbody fusion. Hardware intact. No acute bony abnormality. Electronically Signed   By: Maisie Fushomas  Register   On: 09/01/2017 13:40   Ct Head Wo Contrast  Result Date: 09/19/2017 CLINICAL DATA:  76 year old hypertensive female with confusion. No known injury. Initial encounter. EXAM: CT HEAD WITHOUT CONTRAST TECHNIQUE: Contiguous axial images were obtained from the base of the skull through the vertex without intravenous contrast. COMPARISON:  09/24/2016 head CT. FINDINGS: Brain: No intracranial hemorrhage or CT evidence of large acute infarct. Remote left thalamic infarct. Moderate chronic microvascular changes. Global atrophy without hydrocephalus. No intracranial mass lesion noted on this unenhanced exam. Vascular: Prominent vascular calcifications. Skull: No acute abnormality. Sinuses/Orbits: Exophthalmos. Visualized paranasal sinuses are clear. Other: Mastoid air cells and middle ear cavities are clear. IMPRESSION: No intracranial hemorrhage or CT evidence of large acute infarct. Remote left thalamic infarct. Moderate chronic microvascular changes. Global atrophy. Electronically Signed   By: Lacy DuverneySteven  Olson M.D.   On: 09/19/2017 16:15   Dg Hip Unilat W Or Wo Pelvis 2-3 Views Left  Result Date: 09/01/2017 CLINICAL DATA:  Left hip pain.  Low back pain.  Multiple falls . EXAM: DG HIP (WITH OR WITHOUT  PELVIS) 2-3V LEFT COMPARISON:  11/15/2016 . FINDINGS: Bilateral hip replacements. Hardware intact. Anatomic alignment. Postsurgical changes left femur. Mild lucency in the distal left femoral shaft noted. This may be related to prior surgery. Left ovary series suggest for further evaluation Prior lumbar spine fusion . No acute bony abnormality identified. Pelvic calcifications consistent phleboliths. Surgical clips in the pelvis . IMPRESSION: 1. Bilateral hip replacements. Hardware intact. Anatomic alignment. Postsurgical changes distal left femur. No acute abnormality . 2. Postsurgical changes distal left femur. Mild lucency in the femoral shaft noted. This may be related to prior surgery. Left femur series suggested for further evaluation . 3.  Prior lumbar fusion. 4. Peripheral vascular disease . Electronically Signed   By: Maisie Fushomas  Register   On: 09/01/2017 13:44   Dg Femur Min 2 Views Left  Result Date: 09/01/2017 CLINICAL DATA:  76 y/o female with left hip and leg pain after multiple falls in the past week. Prior left lower extremity surgery. EXAM: LEFT FEMUR 2 VIEWS COMPARISON:  Left hip series and  left knee series 12 11/2016. FINDINGS: Left total hip arthroplasty hardware appears stable and intact. Left hemipelvis and proximal left femur appears stable and intact. Left total knee arthroplasty with distal femur intramedullary rod and cannulated screws. The hardware appears stable since 2017 and intact. Osteopenia of the visible tibia and fibula. Stable patella. No acute osseous abnormality identified. Calcified femoral artery and peripheral vascular atherosclerosis. IMPRESSION: 1. Stable postoperative appearance of left total hip and left knee arthroplasty. 2.  No acute osseous abnormality identified. Electronically Signed   By: Odessa Fleming M.D.   On: 09/01/2017 14:31    Lab Results: Basic Metabolic Panel:  Recent Labs  16/10/96 1241  NA 137  K 4.1  CL 104  CO2 22  GLUCOSE 115*  BUN 38*   CREATININE 1.52*  CALCIUM 9.8   Liver Function Tests: No results for input(s): AST, ALT, ALKPHOS, BILITOT, PROT, ALBUMIN in the last 72 hours.   CBC:  Recent Labs  09/23/17 1241  WBC 7.7  NEUTROABS 4.2  HGB 11.0*  HCT 35.6*  MCV 75.3*  PLT 269    Recent Results (from the past 240 hour(s))  MRSA PCR Screening     Status: Abnormal   Collection Time: 09/20/17 12:47 AM  Result Value Ref Range Status   MRSA by PCR POSITIVE (A) NEGATIVE Final    Comment: RESULT CALLED TO, READ BACK BY AND VERIFIED WITH: TETREAULT,H @0336  BY MATTHEWS, B 10.17.18        The GeneXpert MRSA Assay (FDA approved for NASAL specimens only), is one component of a comprehensive MRSA colonization surveillance program. It is not intended to diagnose MRSA infection nor to guide or monitor treatment for MRSA infections.      Hospital Course: This is a 76 year old with multiple medical problems who came to the hospital because of cough congestion and altered mental status. She was found to have right lower lobe pneumonia. She was started on antibiotics and fluids and improved but had significant issues with her mental status. She does have some mild dementia at baseline and in the past has had problems with altered mental status when she gets in the hospital with illnesses. This improved over the next several days. At the time of discharge she is back at baseline.  Discharge Exam: Blood pressure (!) 96/45, pulse 76, temperature 98 F (36.7 C), temperature source Oral, resp. rate 16, height 5\' 2"  (1.575 m), weight 84.8 kg (187 lb), SpO2 100 %. She is awake and alert. Oriented now. Chest is clear.  Disposition: Home with home health services. Her daughter has significant other help at home and I think she will actually improved when she gets home.  Discharge Instructions    Call MD for:  persistant nausea and vomiting    Complete by:  As directed    Call MD for:  severe uncontrolled pain    Complete  by:  As directed    Call MD for:  temperature >100.4    Complete by:  As directed    Diet - low sodium heart healthy    Complete by:  As directed    Face-to-face encounter (required for Medicare/Medicaid patients)    Complete by:  As directed    I Ronin Crager L certify that this patient is under my care and that I, or a nurse practitioner or physician's assistant working with me, had a face-to-face encounter that meets the physician face-to-face encounter requirements with this patient on 09/25/2017. The encounter with the patient was in  whole, or in part for the following medical condition(s) which is the primary reason for home health care (List medical condition): Pneumonia/altered mental status   The encounter with the patient was in whole, or in part, for the following medical condition, which is the primary reason for home health care:  Pneumonia/altered mental status   I certify that, based on my findings, the following services are medically necessary home health services:   Nursing Physical therapy     Reason for Medically Necessary Home Health Services:  Skilled Nursing- Change/Decline in Patient Status   My clinical findings support the need for the above services:  Unable to leave home safely without assistance and/or assistive device   Further, I certify that my clinical findings support that this patient is homebound due to:  Unable to leave home safely without assistance   Home Health    Complete by:  As directed    To provide the following care/treatments:   PT RN     Increase activity slowly    Complete by:  As directed       Follow-up Information    [facility] EMERGENCY DEPARTMENT Follow up.   Specialty:  Emergency Medicine Why:  If symptoms worsen Contact information: [id number] [id number] [place] [address] [phone]       [facility] Follow up.   Contact information: [address] [place]  [address] [phone]           Signed: Fredirick Maudlin   09/25/2017, 9:02 AM

## 2017-09-25 NOTE — Progress Notes (Signed)
Subjective: She is overall better. She still has episodes of confusion but I think a lot of that is related to her being in the hospital and being sick. Her daughter is with her this morning and wants to try to take her home which I think is appropriate.  Objective: Vital signs in last 24 hours: Temp:  [98 F (36.7 C)-98.4 F (36.9 C)] 98 F (36.7 C) (10/22 0800) Pulse Rate:  [76-106] 76 (10/22 0800) Resp:  [16-20] 16 (10/22 0800) BP: (96-137)/(45-87) 96/45 (10/22 0800) SpO2:  [98 %-100 %] 100 % (10/22 0800) Weight change:  Last BM Date: 09/20/17  Intake/Output from previous day: 10/21 0701 - 10/22 0700 In: 360 [P.O.:360] Out: 300 [Urine:300]  PHYSICAL EXAM General appearance: alert, cooperative and no distress Resp: rhonchi Bilaterally right more than left Cardio: irregularly irregular rhythm GI: soft, non-tender; bowel sounds normal; no masses,  no organomegaly Extremities: extremities normal, atraumatic, no cyanosis or edema Alert and oriented this morning  Lab Results:  Results for orders placed or performed during the hospital encounter of 09/19/17 (from the past 48 hour(s))  CBC with Differential/Platelet     Status: Abnormal   Collection Time: 09/23/17 12:41 PM  Result Value Ref Range   WBC 7.7 4.0 - 10.5 K/uL   RBC 4.73 3.87 - 5.11 MIL/uL   Hemoglobin 11.0 (L) 12.0 - 15.0 g/dL   HCT 35.6 (L) 36.0 - 46.0 %   MCV 75.3 (L) 78.0 - 100.0 fL   MCH 23.3 (L) 26.0 - 34.0 pg   MCHC 30.9 30.0 - 36.0 g/dL   RDW 19.4 (H) 11.5 - 15.5 %   Platelets 269 150 - 400 K/uL    Comment: SPECIMEN CHECKED FOR CLOTS PLATELET COUNT CONFIRMED BY SMEAR    Neutrophils Relative % 55 %   Neutro Abs 4.2 1.7 - 7.7 K/uL   Lymphocytes Relative 25 %   Lymphs Abs 2.0 0.7 - 4.0 K/uL   Monocytes Relative 11 %   Monocytes Absolute 0.9 0.1 - 1.0 K/uL   Eosinophils Relative 7 %   Eosinophils Absolute 0.6 0.0 - 0.7 K/uL   Basophils Relative 1 %   Basophils Absolute 0.1 0.0 - 0.1 K/uL  Basic  metabolic panel     Status: Abnormal   Collection Time: 09/23/17 12:41 PM  Result Value Ref Range   Sodium 137 135 - 145 mmol/L   Potassium 4.1 3.5 - 5.1 mmol/L   Chloride 104 101 - 111 mmol/L   CO2 22 22 - 32 mmol/L   Glucose, Bld 115 (H) 65 - 99 mg/dL   BUN 38 (H) 6 - 20 mg/dL   Creatinine, Ser 1.52 (H) 0.44 - 1.00 mg/dL   Calcium 9.8 8.9 - 10.3 mg/dL   GFR calc non Af Amer 32 (L) >60 mL/min   GFR calc Af Amer 37 (L) >60 mL/min    Comment: (NOTE) The eGFR has been calculated using the CKD EPI equation. This calculation has not been validated in all clinical situations. eGFR's persistently <60 mL/min signify possible Chronic Kidney Disease.    Anion gap 11 5 - 15  RPR     Status: None   Collection Time: 09/23/17 12:41 PM  Result Value Ref Range   RPR Ser Ql Non Reactive Non Reactive    Comment: (NOTE) Performed At: [facility] [place], [place] [id number] Lindon Romp MD PY:[id number]   Sedimentation rate     Status: None   Collection Time: 09/23/17 12:41 PM  Result Value Ref Range   Sed Rate 13 0 - 22 mm/hr  Vitamin B12     Status: None   Collection Time: 09/24/17  7:04 AM  Result Value Ref Range   Vitamin B-12 583 180 - 914 pg/mL    Comment: (NOTE) This assay is not validated for testing neonatal or myeloproliferative syndrome specimens for Vitamin B12 levels. Performed at [facility], [place] [address], [place], [place] [address]   Folate     Status: None   Collection Time: 09/24/17  7:04 AM  Result Value Ref Range   Folate 29.0 >5.9 ng/mL    Comment: Performed at [facility] [address] [place], [place] [address]    ABGS No results for input(s): PHART, PO2ART, TCO2, HCO3 in the last 72 hours.  Invalid input(s): PCO2 CULTURES Recent Results (from the past 240 hour(s))  MRSA PCR Screening     Status: Abnormal   Collection Time: 09/20/17 12:47 AM  Result Value Ref Range Status   MRSA by PCR POSITIVE (A) NEGATIVE  Final    Comment: RESULT CALLED TO, READ BACK BY AND VERIFIED WITH: TETREAULT,H _0  BY MATTHEWS, B 10.17.18        The GeneXpert MRSA Assay (FDA approved for NASAL specimens only), is one component of a comprehensive MRSA colonization surveillance program. It is not intended to diagnose MRSA infection nor to guide or monitor treatment for MRSA infections.    Studies/Results: No results found.  Medications:  Prior to Admission:  Prescriptions Prior to Admission  Medication Sig Dispense Refill Last Dose  . acetaminophen (TYLENOL) 325 MG tablet Take 325-650 mg by mouth every 6 (six) hours as needed for mild pain, moderate pain or headache.    unknown  . apixaban (ELIQUIS) 5 MG TABS tablet Take 5 mg by mouth 2 (two) times daily.    09/19/2017 at [place]  . aspirin EC 81 MG tablet Take 81 mg by mouth daily.   09/19/2017 at Unknown time  . atorvastatin (LIPITOR) 40 MG tablet Take 40 mg by mouth at bedtime.   09/18/2017 at Unknown time  . benazepril (LOTENSIN) 40 MG tablet Take 40 mg by mouth daily.    09/19/2017 at Unknown time  . ferrous sulfate 325 (65 FE) MG tablet Take 325 mg by mouth daily with breakfast.   09/19/2017 at Unknown time  . furosemide (LASIX) 40 MG tablet Take 40 mg by mouth daily as needed for fluid.   unknown  . gabapentin (NEURONTIN) 100 MG capsule Take 200 mg by mouth at bedtime.   09/18/2017 at Unknown time  . ibuprofen (ADVIL,MOTRIN) 200 MG tablet Take 600 mg by mouth every 6 (six) hours as needed for headache, mild pain or moderate pain.   unknown  . levETIRAcetam (KEPPRA) 500 MG tablet Take 500 mg by mouth 2 (two) times daily.   09/19/2017 at 500a  . lubiprostone (AMITIZA) 24 MCG capsule Take 24 mcg by mouth 2 (two) times daily with a meal.   09/19/2017 at Unknown time  . Melatonin 3 MG TABS Take 3 mg by mouth at bedtime as needed (for sleep).   unknown  . metFORMIN (GLUCOPHAGE) 500 MG tablet Take 500 mg by mouth 2 (two) times daily with a meal.    09/19/2017 at  Unknown time  . metoprolol tartrate (LOPRESSOR) 25 MG tablet Take 12.5 mg by mouth 2 (two) times daily.    09/19/2017 at [place]  . nitroGLYCERIN (NITROSTAT) 0.4 MG SL tablet Place  1 tablet (0.4 mg total) under the tongue every 5 (five) minutes as needed for chest pain. 25 tablet 1 unknown  . omega-3 acid ethyl esters (LOVAZA) 1 G capsule Take 2 g by mouth 2 (two) times daily.    09/19/2017 at Unknown time  . ondansetron (ZOFRAN-ODT) 4 MG disintegrating tablet Take 4 mg by mouth every 8 (eight) hours as needed for nausea or vomiting.   unknown  . pantoprazole (PROTONIX) 40 MG tablet Take 1 tablet (40 mg total) by mouth daily. 30 tablet 3 09/19/2017 at Unknown time  . potassium chloride (K-DUR,KLOR-CON) 10 MEQ tablet Take 10 mEq by mouth daily.   09/19/2017 at Unknown time  . senna-docusate (SENEXON-S) 8.6-50 MG tablet Take 1 tablet by mouth 2 (two) times daily.   09/19/2017 at Unknown time  . traZODone (DESYREL) 50 MG tablet Take 50 mg by mouth at bedtime.   09/18/2017 at Unknown time   Scheduled: . apixaban  5 mg Oral BID  . aspirin EC  81 mg Oral Daily  . atorvastatin  40 mg Oral QHS  . ferrous sulfate  325 mg Oral Q breakfast  . Influenza vac split quadrivalent PF  0.5 mL Intramuscular Tomorrow-1000  . ketorolac  1 drop Left Eye QID  . levETIRAcetam  500 mg Oral BID  . lubiprostone  24 mcg Oral BID WC  . metoprolol tartrate  12.5 mg Oral BID  . omega-3 acid ethyl esters  2 g Oral BID  . pantoprazole  40 mg Oral Daily  . senna-docusate  1 tablet Oral BID   Continuous: . azithromycin Stopped (09/24/17 1730)   LTR:VUYEBXIDHWYSH **OR** acetaminophen, hydrALAZINE, LORazepam **OR** LORazepam, LORazepam **OR** LORazepam, ondansetron **OR** ondansetron (ZOFRAN) IV  Assesment: She was admitted with pneumonia. She had acute metabolic encephalopathy and she's had that in the past when she is in the hospital. She has chronic atrial fib. She has multiple other medical problems but she's pretty stable  now. I think she will do better at home and she will in the hospital. Active Problems:   Chronic diastolic heart failure ([place])   Hypertension   Chronic kidney disease (CKD), stage III (moderate) (HCC)   Protein-calorie malnutrition, severe (HCC)   Atrial fibrillation ([place])   Lobar pneumonia ([place])   Acute metabolic encephalopathy    Plan: Discharge home today    LOS: 6 days   Newman Waren L 09/25/2017, 8:57 AM

## 2017-09-25 NOTE — Progress Notes (Signed)
Pt combative and agitated. Pt continuously removing telemetry leads from self. MD paged to discontinue, as PT is Afib but stable and controlled. Telemetry removed from Pt, will notify MD 09/25/17 morning about noncompliance with telemetry. Telemetry removed and patient placed on standby with [facility]. Continue to monitor VS and Pt appearance/demeanor.

## 2017-09-26 ENCOUNTER — Emergency Department (HOSPITAL_COMMUNITY): Payer: Medicare HMO

## 2017-09-26 ENCOUNTER — Encounter (HOSPITAL_COMMUNITY): Payer: Self-pay | Admitting: *Deleted

## 2017-09-26 ENCOUNTER — Inpatient Hospital Stay (HOSPITAL_COMMUNITY)
Admission: EM | Admit: 2017-09-26 | Discharge: 2017-10-01 | DRG: 682 | Disposition: A | Discharge status: Skilled Nursing Facility | Payer: Medicare HMO | Attending: Pulmonary Disease | Admitting: Pulmonary Disease

## 2017-09-26 DIAGNOSIS — Z8673 Personal history of transient ischemic attack (TIA), and cerebral infarction without residual deficits: Secondary | ICD-10-CM

## 2017-09-26 DIAGNOSIS — R41841 Cognitive communication deficit: Secondary | ICD-10-CM | POA: Diagnosis not present

## 2017-09-26 DIAGNOSIS — D638 Anemia in other chronic diseases classified elsewhere: Secondary | ICD-10-CM | POA: Diagnosis present

## 2017-09-26 DIAGNOSIS — Z8249 Family history of ischemic heart disease and other diseases of the circulatory system: Secondary | ICD-10-CM

## 2017-09-26 DIAGNOSIS — Z7982 Long term (current) use of aspirin: Secondary | ICD-10-CM

## 2017-09-26 DIAGNOSIS — Z6836 Body mass index (BMI) 36.0-36.9, adult: Secondary | ICD-10-CM

## 2017-09-26 DIAGNOSIS — J440 Chronic obstructive pulmonary disease with acute lower respiratory infection: Secondary | ICD-10-CM | POA: Diagnosis present

## 2017-09-26 DIAGNOSIS — N183 Chronic kidney disease, stage 3 unspecified: Secondary | ICD-10-CM | POA: Diagnosis present

## 2017-09-26 DIAGNOSIS — R488 Other symbolic dysfunctions: Secondary | ICD-10-CM | POA: Diagnosis not present

## 2017-09-26 DIAGNOSIS — J189 Pneumonia, unspecified organism: Secondary | ICD-10-CM | POA: Diagnosis not present

## 2017-09-26 DIAGNOSIS — N179 Acute kidney failure, unspecified: Principal | ICD-10-CM | POA: Diagnosis present

## 2017-09-26 DIAGNOSIS — I4891 Unspecified atrial fibrillation: Secondary | ICD-10-CM | POA: Diagnosis present

## 2017-09-26 DIAGNOSIS — I10 Essential (primary) hypertension: Secondary | ICD-10-CM | POA: Diagnosis present

## 2017-09-26 DIAGNOSIS — I959 Hypotension, unspecified: Secondary | ICD-10-CM | POA: Diagnosis present

## 2017-09-26 DIAGNOSIS — R4182 Altered mental status, unspecified: Secondary | ICD-10-CM | POA: Diagnosis not present

## 2017-09-26 DIAGNOSIS — Z79899 Other long term (current) drug therapy: Secondary | ICD-10-CM

## 2017-09-26 DIAGNOSIS — D631 Anemia in chronic kidney disease: Secondary | ICD-10-CM | POA: Diagnosis not present

## 2017-09-26 DIAGNOSIS — K219 Gastro-esophageal reflux disease without esophagitis: Secondary | ICD-10-CM | POA: Diagnosis not present

## 2017-09-26 DIAGNOSIS — E890 Postprocedural hypothyroidism: Secondary | ICD-10-CM | POA: Diagnosis present

## 2017-09-26 DIAGNOSIS — I130 Hypertensive heart and chronic kidney disease with heart failure and stage 1 through stage 4 chronic kidney disease, or unspecified chronic kidney disease: Secondary | ICD-10-CM | POA: Diagnosis not present

## 2017-09-26 DIAGNOSIS — Z885 Allergy status to narcotic agent status: Secondary | ICD-10-CM

## 2017-09-26 DIAGNOSIS — F0390 Unspecified dementia without behavioral disturbance: Secondary | ICD-10-CM | POA: Diagnosis present

## 2017-09-26 DIAGNOSIS — J449 Chronic obstructive pulmonary disease, unspecified: Secondary | ICD-10-CM | POA: Diagnosis not present

## 2017-09-26 DIAGNOSIS — E1122 Type 2 diabetes mellitus with diabetic chronic kidney disease: Secondary | ICD-10-CM | POA: Diagnosis not present

## 2017-09-26 DIAGNOSIS — I252 Old myocardial infarction: Secondary | ICD-10-CM

## 2017-09-26 DIAGNOSIS — R410 Disorientation, unspecified: Secondary | ICD-10-CM | POA: Diagnosis not present

## 2017-09-26 DIAGNOSIS — E669 Obesity, unspecified: Secondary | ICD-10-CM | POA: Diagnosis present

## 2017-09-26 DIAGNOSIS — J989 Respiratory disorder, unspecified: Secondary | ICD-10-CM | POA: Diagnosis not present

## 2017-09-26 DIAGNOSIS — I5032 Chronic diastolic (congestive) heart failure: Secondary | ICD-10-CM | POA: Diagnosis not present

## 2017-09-26 DIAGNOSIS — G40909 Epilepsy, unspecified, not intractable, without status epilepticus: Secondary | ICD-10-CM

## 2017-09-26 DIAGNOSIS — E119 Type 2 diabetes mellitus without complications: Secondary | ICD-10-CM

## 2017-09-26 DIAGNOSIS — Z950 Presence of cardiac pacemaker: Secondary | ICD-10-CM | POA: Diagnosis present

## 2017-09-26 DIAGNOSIS — I482 Chronic atrial fibrillation: Secondary | ICD-10-CM | POA: Diagnosis present

## 2017-09-26 DIAGNOSIS — R296 Repeated falls: Secondary | ICD-10-CM | POA: Diagnosis present

## 2017-09-26 DIAGNOSIS — Z90710 Acquired absence of both cervix and uterus: Secondary | ICD-10-CM

## 2017-09-26 DIAGNOSIS — I2510 Atherosclerotic heart disease of native coronary artery without angina pectoris: Secondary | ICD-10-CM | POA: Diagnosis present

## 2017-09-26 DIAGNOSIS — Z951 Presence of aortocoronary bypass graft: Secondary | ICD-10-CM

## 2017-09-26 DIAGNOSIS — Z96653 Presence of artificial knee joint, bilateral: Secondary | ICD-10-CM | POA: Diagnosis present

## 2017-09-26 DIAGNOSIS — E860 Dehydration: Secondary | ICD-10-CM | POA: Diagnosis present

## 2017-09-26 DIAGNOSIS — Z7901 Long term (current) use of anticoagulants: Secondary | ICD-10-CM

## 2017-09-26 DIAGNOSIS — Z87891 Personal history of nicotine dependence: Secondary | ICD-10-CM | POA: Diagnosis not present

## 2017-09-26 DIAGNOSIS — G4730 Sleep apnea, unspecified: Secondary | ICD-10-CM | POA: Diagnosis present

## 2017-09-26 DIAGNOSIS — G9341 Metabolic encephalopathy: Secondary | ICD-10-CM | POA: Diagnosis not present

## 2017-09-26 DIAGNOSIS — Z8711 Personal history of peptic ulcer disease: Secondary | ICD-10-CM

## 2017-09-26 DIAGNOSIS — Y95 Nosocomial condition: Secondary | ICD-10-CM | POA: Diagnosis present

## 2017-09-26 DIAGNOSIS — G8929 Other chronic pain: Secondary | ICD-10-CM | POA: Diagnosis present

## 2017-09-26 DIAGNOSIS — M6281 Muscle weakness (generalized): Secondary | ICD-10-CM | POA: Diagnosis not present

## 2017-09-26 DIAGNOSIS — Z888 Allergy status to other drugs, medicaments and biological substances status: Secondary | ICD-10-CM

## 2017-09-26 DIAGNOSIS — Z96643 Presence of artificial hip joint, bilateral: Secondary | ICD-10-CM | POA: Diagnosis present

## 2017-09-26 DIAGNOSIS — Z91010 Allergy to peanuts: Secondary | ICD-10-CM

## 2017-09-26 DIAGNOSIS — E43 Unspecified severe protein-calorie malnutrition: Secondary | ICD-10-CM | POA: Diagnosis not present

## 2017-09-26 DIAGNOSIS — Z66 Do not resuscitate: Secondary | ICD-10-CM | POA: Diagnosis present

## 2017-09-26 DIAGNOSIS — R279 Unspecified lack of coordination: Secondary | ICD-10-CM | POA: Diagnosis not present

## 2017-09-26 DIAGNOSIS — R42 Dizziness and giddiness: Secondary | ICD-10-CM | POA: Diagnosis not present

## 2017-09-26 DIAGNOSIS — Z7401 Bed confinement status: Secondary | ICD-10-CM | POA: Diagnosis not present

## 2017-09-26 DIAGNOSIS — M545 Low back pain: Secondary | ICD-10-CM | POA: Diagnosis present

## 2017-09-26 DIAGNOSIS — R531 Weakness: Secondary | ICD-10-CM

## 2017-09-26 LAB — COMPREHENSIVE METABOLIC PANEL
ALBUMIN: 3.5 g/dL (ref 3.5–5.0)
ALK PHOS: 75 U/L (ref 38–126)
ALT: 16 U/L (ref 14–54)
AST: 25 U/L (ref 15–41)
Anion gap: 13 (ref 5–15)
BILIRUBIN TOTAL: 0.5 mg/dL (ref 0.3–1.2)
BUN: 55 mg/dL — AB (ref 6–20)
CALCIUM: 9.7 mg/dL (ref 8.9–10.3)
CO2: 21 mmol/L — ABNORMAL LOW (ref 22–32)
Chloride: 106 mmol/L (ref 101–111)
Creatinine, Ser: 3 mg/dL — ABNORMAL HIGH (ref 0.44–1.00)
GFR calc Af Amer: 16 mL/min — ABNORMAL LOW (ref >60)
GFR calc non Af Amer: 14 mL/min — ABNORMAL LOW (ref >60)
GLUCOSE: 127 mg/dL — AB (ref 65–99)
POTASSIUM: 4.1 mmol/L (ref 3.5–5.1)
Sodium: 140 mmol/L (ref 135–145)
TOTAL PROTEIN: 7.4 g/dL (ref 6.5–8.1)

## 2017-09-26 LAB — URINALYSIS, ROUTINE W REFLEX MICROSCOPIC
BACTERIA UA: NONE SEEN
Bilirubin Urine: NEGATIVE
Glucose, UA: NEGATIVE mg/dL
Hgb urine dipstick: NEGATIVE
Ketones, ur: NEGATIVE mg/dL
Leukocytes, UA: NEGATIVE
Nitrite: NEGATIVE
Protein, ur: 30 mg/dL — AB
SPECIFIC GRAVITY, URINE: 1.021 (ref 1.005–1.030)
pH: 5 (ref 5.0–8.0)

## 2017-09-26 LAB — CBC WITH DIFFERENTIAL/PLATELET
Basophils Absolute: 0.1 10*3/uL (ref 0.0–0.1)
Basophils Relative: 1 %
EOS ABS: 0.3 10*3/uL (ref 0.0–0.7)
Eosinophils Relative: 4 %
HCT: 34.4 % — ABNORMAL LOW (ref 36.0–46.0)
HEMOGLOBIN: 10.2 g/dL — AB (ref 12.0–15.0)
LYMPHS ABS: 2.5 10*3/uL (ref 0.7–4.0)
Lymphocytes Relative: 29 %
MCH: 22.7 pg — AB (ref 26.0–34.0)
MCHC: 29.7 g/dL — AB (ref 30.0–36.0)
MCV: 76.4 fL — ABNORMAL LOW (ref 78.0–100.0)
MONOS PCT: 8 %
Monocytes Absolute: 0.7 10*3/uL (ref 0.1–1.0)
NEUTROS PCT: 58 %
Neutro Abs: 5 10*3/uL (ref 1.7–7.7)
Platelets: 292 10*3/uL (ref 150–400)
RBC: 4.5 MIL/uL (ref 3.87–5.11)
RDW: 19.7 % — ABNORMAL HIGH (ref 11.5–15.5)
WBC: 8.6 10*3/uL (ref 4.0–10.5)

## 2017-09-26 LAB — I-STAT CG4 LACTIC ACID, ED
Lactic Acid, Venous: 2.56 mmol/L (ref 0.5–1.9)
Lactic Acid, Venous: 1.69 mmol/L (ref 0.5–1.9)

## 2017-09-26 LAB — CBG MONITORING, ED: GLUCOSE-CAPILLARY: 130 mg/dL — AB (ref 65–99)

## 2017-09-26 LAB — PROTIME-INR
INR: 1.46
PROTHROMBIN TIME: 17.6 s — AB (ref 11.4–15.2)

## 2017-09-26 MED ORDER — ACETAMINOPHEN 650 MG RE SUPP: 650.0000 mg | Freq: Four times a day (QID) | RECTAL | Status: DC | PRN

## 2017-09-26 MED ORDER — PANTOPRAZOLE SODIUM 40 MG PO TBEC
40.0000 mg | DELAYED_RELEASE_TABLET | Freq: Every day | ORAL | Status: DC
  Administered 2017-09-27 – 2017-10-01 (×4): 40 mg via ORAL
  Filled 2017-09-26 (×5): qty 1

## 2017-09-26 MED ORDER — SODIUM CHLORIDE 0.9 % IV BOLUS (SEPSIS)
1000.0000 mL | Freq: Once | INTRAVENOUS | Status: AC
  Administered 2017-09-26: 1000 mL via INTRAVENOUS

## 2017-09-26 MED ORDER — ONDANSETRON HCL 4 MG PO TABS
4.0000 mg | ORAL_TABLET | Freq: Four times a day (QID) | ORAL | Status: DC | PRN
  Administered 2017-09-27: 4 mg via ORAL
  Filled 2017-09-26: qty 1

## 2017-09-26 MED ORDER — APIXABAN 5 MG PO TABS
5.0000 mg | ORAL_TABLET | Freq: Two times a day (BID) | ORAL | Status: DC
  Administered 2017-09-27 – 2017-10-01 (×9): 5 mg via ORAL
  Filled 2017-09-26 (×10): qty 1

## 2017-09-26 MED ORDER — LUBIPROSTONE 24 MCG PO CAPS
24.0000 ug | ORAL_CAPSULE | Freq: Two times a day (BID) | ORAL | Status: DC
  Administered 2017-09-28 – 2017-10-01 (×3): 24 ug via ORAL
  Filled 2017-09-26 (×4): qty 1

## 2017-09-26 MED ORDER — LEVETIRACETAM 500 MG PO TABS
500.0000 mg | ORAL_TABLET | Freq: Two times a day (BID) | ORAL | Status: DC
  Administered 2017-09-27 – 2017-10-01 (×9): 500 mg via ORAL
  Filled 2017-09-26 (×10): qty 1

## 2017-09-26 MED ORDER — DEXTROSE 5 % IV SOLN
1.0000 g | INTRAVENOUS | Status: DC
  Administered 2017-09-27 – 2017-09-28 (×2): 1 g via INTRAVENOUS
  Filled 2017-09-26 (×5): qty 1

## 2017-09-26 MED ORDER — SODIUM CHLORIDE 0.9 % IV SOLN
INTRAVENOUS | Status: DC
  Administered 2017-09-27 – 2017-09-28 (×5): via INTRAVENOUS
  Administered 2017-09-29: 100 mL/h via INTRAVENOUS
  Administered 2017-09-29: 04:00:00 via INTRAVENOUS

## 2017-09-26 MED ORDER — INSULIN ASPART 100 UNIT/ML ~~LOC~~ SOLN: 0.0000 [IU] | Freq: Three times a day (TID) | SUBCUTANEOUS | Status: DC

## 2017-09-26 MED ORDER — TRAZODONE HCL 50 MG PO TABS
50.0000 mg | ORAL_TABLET | Freq: Every day | ORAL | Status: DC
  Administered 2017-09-27 – 2017-09-30 (×5): 50 mg via ORAL
  Filled 2017-09-26 (×5): qty 1

## 2017-09-26 MED ORDER — SENNOSIDES-DOCUSATE SODIUM 8.6-50 MG PO TABS
1.0000 | ORAL_TABLET | Freq: Two times a day (BID) | ORAL | Status: DC
  Administered 2017-09-27 – 2017-10-01 (×8): 1 via ORAL
  Filled 2017-09-26 (×9): qty 1

## 2017-09-26 MED ORDER — GABAPENTIN 100 MG PO CAPS
200.0000 mg | ORAL_CAPSULE | Freq: Every day | ORAL | Status: DC
  Administered 2017-09-27 – 2017-09-30 (×5): 200 mg via ORAL
  Filled 2017-09-26 (×5): qty 2

## 2017-09-26 MED ORDER — ONDANSETRON HCL 4 MG/2ML IJ SOLN: 4.0000 mg | Freq: Four times a day (QID) | INTRAMUSCULAR | Status: DC | PRN

## 2017-09-26 MED ORDER — ACETAMINOPHEN 325 MG PO TABS
650.0000 mg | ORAL_TABLET | Freq: Four times a day (QID) | ORAL | Status: DC | PRN
  Administered 2017-09-28 – 2017-10-01 (×3): 650 mg via ORAL
  Filled 2017-09-26 (×3): qty 2

## 2017-09-26 MED ORDER — ATORVASTATIN CALCIUM 40 MG PO TABS
40.0000 mg | ORAL_TABLET | Freq: Every day | ORAL | Status: DC
  Administered 2017-09-27 – 2017-09-30 (×5): 40 mg via ORAL
  Filled 2017-09-26 (×5): qty 1

## 2017-09-26 MED ORDER — VANCOMYCIN HCL 10 G IV SOLR
1500.0000 mg | Freq: Once | INTRAVENOUS | Status: AC
  Administered 2017-09-26: 1500 mg via INTRAVENOUS
  Filled 2017-09-26 (×2): qty 1500

## 2017-09-26 MED ORDER — DEXTROSE 5 % IV SOLN
2.0000 g | Freq: Once | INTRAVENOUS | Status: AC
  Administered 2017-09-26: 2 g via INTRAVENOUS
  Filled 2017-09-26: qty 2

## 2017-09-26 MED ORDER — ASPIRIN EC 81 MG PO TBEC
81.0000 mg | DELAYED_RELEASE_TABLET | Freq: Every day | ORAL | Status: DC
  Administered 2017-09-27 – 2017-10-01 (×4): 81 mg via ORAL
  Filled 2017-09-26 (×5): qty 1

## 2017-09-26 MED ORDER — VANCOMYCIN HCL IN DEXTROSE 1-5 GM/200ML-% IV SOLN
1000.0000 mg | INTRAVENOUS | Status: DC
  Administered 2017-09-27 – 2017-09-28 (×2): 1000 mg via INTRAVENOUS
  Filled 2017-09-26 (×2): qty 200

## 2017-09-26 NOTE — ED Notes (Signed)
Date and time results received: 09/26/17 1970 (use smartphrase ".now" to insert current time)  Test: istat lactic acid Critical Value: 2.56  Name of Provider Notified: dr Adriana Simascook  Orders Received? Or Actions Taken?: Actions Taken: no orders received

## 2017-09-26 NOTE — ED Provider Notes (Signed)
Level 5 caveat for altered mental status [facility] EMERGENCY DEPARTMENT Provider Note   CSN: [id number] Arrival date & time: 09/26/17  1858     History   Chief Complaint Chief Complaint  Patient presents with  . Weakness    HPI [name] is a 76 y.o. female.  Level 5 caveat for altered mental status.  Patient was admitted to the hospital on 09/19/2017 and discharged on 09/25/2017 with a diagnosis of right basilar pneumonia.  Her discharge blood pressure was 96/45.  She has multiple health problems well documented in her past medical history.  Daughter reports that she has had increased confusion and weakness since discharge.  No specific chest pain, dyspnea, dysuria, fever, sweats, chills.      Past Medical History:  Diagnosis Date  . Anemia   . Anginal pain (HCC)    "all the time, but not heart related"  . Arthritis    "legs and back" (09/30/2013)  . Asthma    years ago  . 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 Blurred vision, bilateral    sometimes double vision  . CAD (coronary artery disease)    multivessel, CABG 6/10.  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  Myoview (11/20/11) showed no ischemia & EF 62%  . Chronic lower back pain   . COPD (chronic obstructive pulmonary disease) (HCC)    severe lung disease by PFTs 6/12  . DDD (degenerative disc disease)   . Deep vein thrombosis, upper right extremity (HCC)    post-PPM  . Dehiscence of closure of sternum or sternotomy 07/2009   sternal infection; required flap closure  . Diabetes mellitus, type II (HCC)   . Gastroesophageal reflux disease    not much  . H/O hiatal hernia   . History of kidney stones   . Hyperlipidemia   . Hypertension   . Migraines    "years ago" (09/30/2013)  . Obesity   . Orthopnea    sometimes  . Pacemaker   . PUD (peptic ulcer disease)   . Seizures (HCC)    "long time ago; don't remember what they were related to" (09/30/2013)none recent  .  Sleep apnea    denies CPAP use on 09/30/2013  . Stroke [facility])    years ago, "residual on left back"  . Urge incontinence of urine     Patient Active Problem List   Diagnosis Date Noted  . Lobar pneumonia (HCC) 09/19/2017  . Acute metabolic encephalopathy 09/19/2017  . Concussion with no loss of consciousness 09/28/2016  . AKI (acute kidney injury) (HCC) 09/25/2016  . Acute kidney injury (nontraumatic) (HCC) 09/25/2016  . Fall 09/25/2016  . Acute kidney injury (HCC) 09/25/2016  . Chest pain 08/29/2016  . Atrial fibrillation (HCC) 02/06/2015  . Encephalopathy, metabolic 10/12/2014  . Intractable nausea and vomiting 10/09/2014  . Epigastric abdominal pain 10/09/2014  . Expected blood loss anemia 12/04/2013  . S/P left THA, AA 12/03/2013  . Protein-calorie malnutrition, severe (HCC) 10/02/2013  . Precordial pain 09/30/2013  . Failure to thrive 07/26/2013  . Anemia of chronic disease 07/13/2013  . Altered mental state 07/13/2013  . NSTEMI (non-ST elevated myocardial infarction) (HCC) 07/01/2013  . Acute delirium 06/29/2013  . Fecal impaction (HCC) 06/29/2013  . Total knee replacement status 05/22/2013  . H/O total hip arthroplasty 05/22/2013  . Spinal stenosis, lumbar region, with neurogenic claudication 05/22/2013  . Osteoarthritis of left hip 05/22/2013  . Noninfectious gastroenteritis and colitis 03/05/2013  .  Dehydration 03/05/2013  . Family history of colon cancer 02/15/2013  . PUD (peptic ulcer disease) 02/15/2013  . Diabetes mellitus, type II (HCC)   . Hypertension   . Arteriosclerotic cardiovascular disease (ASCVD)   . Gastroesophageal reflux disease   . Hyperlipidemia   . Deep vein thrombosis, upper right extremity (HCC)   . Obesity   . AV block   . COPD (chronic obstructive pulmonary disease) (HCC)   . Sleep apnea   . Chronic kidney disease (CKD), stage III (moderate) (HCC)   . Seizure disorder (HCC)   . Anemia, normocytic normochromic 12/15/2011  . Chronic  diastolic heart failure (HCC) 11/18/2011  . PACEMAKER, PERMANENT 05/13/2009  . 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 GASTRIC ULCER 05/12/2009  . Gastroparesis 05/12/2009  . DIVERTICULAR DISEASE 05/12/2009    Past Surgical History:  Procedure Laterality Date  . ABDOMINAL HYSTERECTOMY     partial  . APPENDECTOMY    . BACK SURGERY     x3 total  . CESAREAN SECTION  ~1980  . CHOLECYSTECTOMY    . COLONOSCOPY  04/27/2005   hemorrhoids, diverticula  . COLONOSCOPY WITH ESOPHAGOGASTRODUODENOSCOPY (EGD) N/A 02/20/2013   RMR: pancolonic diverticulosis and internal hemorrhoids. EGD with small hiatal hernia, healed gastric ulcer  . CORONARY ARTERY BYPASS GRAFT  05/1999   - LIMA to LAD, SVG to OM, SVG to PDA  . ESOPHAGOGASTRODUODENOSCOPY     with [facility] dilation,  biopsy, and disruption Schatzki's ring  . ESOPHAGOGASTRODUODENOSCOPY  09/10/2012   RMR: Noncritical Schatzki's ring. Diffuse gastric erosions and an  area of partially healing ulceration-status post biopsy/ Small hiatal hernia. Bx reactive gastropathy.  . INSERT / REPLACE / REMOVE PACEMAKER  1993  . JOINT REPLACEMENT    . LUMBAR DISC SURGERY    . PACEMAKER PLACEMENT  1993   Status post DDD pacemaker implantation in 1993, with Medtronic Kappa generator change in 2003 for  second-degree AV block, most recent gen change by Fawn Kirk 04/14/11  . PORTACATH PLACEMENT Left 07/17/2013   Procedure: INSERTION PORT-A-CATH-left subclavian;  Surgeon: Fabio Bering, MD;  Location: [facility];  Service: General;  Laterality: Left;  . POSTERIOR LUMBAR FUSION    . RECONSTRUCTIVE REPAIR STERNAL     for infection s/p CABG 8/10  . REPLACEMENT TOTAL KNEE Bilateral   . THYROIDECTOMY    . TOTAL HIP ARTHROPLASTY Right   . TOTAL HIP ARTHROPLASTY Left 12/03/2013   Procedure: LEFT TOTAL HIP ARTHROPLASTY ANTERIOR APPROACH;  Surgeon: Shelda Pal, MD;  Location: [facility];  Service: Orthopedics;  Laterality: Left;  . TUBAL LIGATION    . WRIST SURGERY Left    "tumor taken off"  (09/30/2013)    OB History    Gravida Para Term Preterm AB Living   5 5 5          SAB TAB Ectopic Multiple Live Births                   Home Medications    Prior to Admission medications   Medication Sig Start Date End Date Taking? Authorizing Provider  acetaminophen (TYLENOL) 325 MG tablet Take 325-650 mg by mouth every 6 (six) hours as needed for mild pain, moderate pain or headache.     [provider]  apixaban (ELIQUIS) 5 MG TABS tablet Take 5 mg by mouth 2 (two) times daily.     [provider]  aspirin EC 81 MG tablet Take 81 mg by mouth daily.    [provider]  atorvastatin (  LIPITOR) 40 MG tablet Take 40 mg by mouth at bedtime.    [provider]  benazepril (LOTENSIN) 40 MG tablet Take 40 mg by mouth daily.     [provider]  ferrous sulfate 325 (65 FE) MG tablet Take 325 mg by mouth daily with breakfast.    [provider]  furosemide (LASIX) 40 MG tablet Take 40 mg by mouth daily as needed for fluid.    [provider]  gabapentin (NEURONTIN) 100 MG capsule Take 200 mg by mouth at bedtime.    [provider]  ibuprofen (ADVIL,MOTRIN) 200 MG tablet Take 600 mg by mouth every 6 (six) hours as needed for headache, mild pain or moderate pain.    [provider]  levETIRAcetam (KEPPRA) 500 MG tablet Take 500 mg by mouth 2 (two) times daily.    [provider]  levofloxacin (LEVAQUIN) 500 MG tablet Take 1 tablet (500 mg total) by mouth daily. 09/25/17 09/30/17  Kari BaarsHawkins, Edward, MD  lubiprostone (AMITIZA) 24 MCG capsule Take 24 mcg by mouth 2 (two) times daily with a meal.    [provider]  Melatonin 3 MG TABS Take 3 mg by mouth at bedtime as needed (for sleep).    [provider]  metFORMIN (GLUCOPHAGE) 500 MG tablet Take 500 mg by mouth 2 (two) times daily with a meal.     [provider]  metoprolol tartrate (LOPRESSOR) 25 MG tablet Take 12.5 mg by mouth 2  (two) times daily.     [provider]  nitroGLYCERIN (NITROSTAT) 0.4 MG SL tablet Place 1 tablet (0.4 mg total) under the tongue every 5 (five) minutes as needed for chest pain. 05/20/14   Antoine PocheBranch, Jonathan F, MD  omega-3 acid ethyl esters (LOVAZA) 1 G capsule Take 2 g by mouth 2 (two) times daily.     [provider]  ondansetron (ZOFRAN-ODT) 4 MG disintegrating tablet Take 4 mg by mouth every 8 (eight) hours as needed for nausea or vomiting.    [provider]  pantoprazole (PROTONIX) 40 MG tablet Take 1 tablet (40 mg total) by mouth daily. 12/03/13   Gelene MinkBoone, Anna W, NP  potassium chloride (K-DUR,KLOR-CON) 10 MEQ tablet Take 10 mEq by mouth daily.    [provider]  senna-docusate (SENEXON-S) 8.6-50 MG tablet Take 1 tablet by mouth 2 (two) times daily.    [provider]  traZODone (DESYREL) 50 MG tablet Take 50 mg by mouth at bedtime.    [provider]    Family History Family History  Problem Relation Age of Onset  . Heart disease Mother        MI  . Colon cancer Father        age 78  . Diabetes Unknown   . Coronary artery disease Unknown        Female <55  . Cancer Unknown     Social History Social History  Substance Use Topics  . Smoking status: Never Smoker  . Smokeless tobacco: Former NeurosurgeonUser    Types: Snuff     Comment: 09/30/2013 "stopped snuff a long time ago"  . Alcohol use No     Allergies   Codeine; Food; Oxycodone hcl; Peanut-containing drug products; and Reglan [metoclopramide]   Review of Systems Review of Systems  Unable to perform ROS: Mental status change     Physical Exam Updated Vital Signs BP (!) 105/54   Pulse 80   Temp 98.6 F (37 C)  Resp 19   SpO2 99%   Physical Exam  Constitutional:  Obtunded, but will respond to deep sternal rub  HENT:  Head: Normocephalic and atraumatic.  Eyes: Conjunctivae are normal.  Neck: Neck supple.  Cardiovascular: Normal rate and regular rhythm.     Pulmonary/Chest: Effort normal and breath sounds normal.  Abdominal: Soft. Bowel sounds are normal.  Musculoskeletal:  unable  Neurological:  unable  Skin: Skin is warm and dry.  Psychiatric:  unable  Nursing note and vitals reviewed.    ED Treatments / Results  Labs (all labs ordered are listed, but only abnormal results are displayed) Labs Reviewed  COMPREHENSIVE METABOLIC PANEL - Abnormal; Notable for the following:       Result Value   CO2 21 (*)    Glucose, Bld 127 (*)    BUN 55 (*)    Creatinine, Ser 3.00 (*)    GFR calc non Af Amer 14 (*)    GFR calc Af Amer 16 (*)    All other components within normal limits  CBC WITH DIFFERENTIAL/PLATELET - Abnormal; Notable for the following:    Hemoglobin 10.2 (*)    HCT 34.4 (*)    MCV 76.4 (*)    MCH 22.7 (*)    MCHC 29.7 (*)    RDW 19.7 (*)    All other components within normal limits  PROTIME-INR - Abnormal; Notable for the following:    Prothrombin Time 17.6 (*)    All other components within normal limits  URINALYSIS, ROUTINE W REFLEX MICROSCOPIC - Abnormal; Notable for the following:    APPearance HAZY (*)    Protein, ur 30 (*)    Squamous Epithelial / LPF 0-5 (*)    All other components within normal limits  I-STAT CG4 LACTIC ACID, ED - Abnormal; Notable for the following:    Lactic Acid, Venous 2.56 (*)    All other components within normal limits  CBG MONITORING, ED - Abnormal; Notable for the following:    Glucose-Capillary 130 (*)    All other components within normal limits  CULTURE, BLOOD (ROUTINE X 2)  CULTURE, BLOOD (ROUTINE X 2)  I-STAT CG4 LACTIC ACID, ED    EKG  EKG Interpretation  Date/Time:  Tuesday September 26 2017 19:20:37 EDT Ventricular Rate:  85 PR Interval:    QRS Duration: 136 QT Interval:  434 QTC Calculation: 517 R Axis:   -82 Text Interpretation:  Afib/flutter and ventricular-paced rhythm No further analysis attempted due to paced rhythm Baseline wander in lead(s) V2 V6  Confirmed by Donnetta Hutching ([id number]) on 09/26/2017 8:23:18 PM       Radiology Ct Head Wo Contrast  Result Date: 09/26/2017 CLINICAL DATA:  76 year old discharged from the hospital yesterday with a diagnosis of pneumonia, presenting today with worsening confusion and increasing weakness. EXAM: CT HEAD WITHOUT CONTRAST TECHNIQUE: Contiguous axial images were obtained from the base of the skull through the vertex without intravenous contrast. COMPARISON:  09/19/2017, 09/24/2016 and earlier. FINDINGS: Patient motion degraded images of the skull base. Brain: Mild cortical atrophy and moderate deep atrophy, unchanged dating back to 2014. Moderate to severe changes of small vessel disease of the white matter diffusely, unchanged. 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 No mass lesion. No midline shift. No acute hemorrhage or hematoma. No extra-axial fluid collections. No evidence of acute infarction. Vascular: Severe atherosclerosis involving the left vertebral artery. Severe bilateral carotid siphon atherosclerosis. No hyperdense vessel. Skull: No skull fracture or other focal  osseous abnormality involving the skull. Sinuses/Orbits: Visualized paranasal sinuses, bilateral mastoid air cells and bilateral middle ear cavities well-aerated. Visualized orbits and globes are normal. Other: None. IMPRESSION: 1. No acute intracranial abnormality. 2. Stable generalized atrophy and moderate to severe chronic microvascular ischemic changes of the white matter. Electronically Signed   By: Hulan Saas M.D.   On: 09/26/2017 20:49   Dg Chest Port 1 View  Result Date: 09/26/2017 CLINICAL DATA:  Weakness. The patient was discharged from the hospital yesterday after being treated for pneumonia. EXAM: PORTABLE CHEST 1 VIEW COMPARISON:  PA and lateral chest 08/29/2016 and 09/19/2017. FINDINGS: The patient is rotated on the exam. There is cardiomegaly without edema. No pneumothorax or pleural effusion.  Lungs clear. Pacing device is in place. Aortic atherosclerosis is noted. IMPRESSION: Cardiomegaly without acute disease. Electronically Signed   By: Drusilla Kanner M.D.   On: 09/26/2017 19:55    Procedures Procedures (including critical care time)  Medications Ordered in ED Medications  vancomycin (VANCOCIN) 1,500 mg in sodium chloride 0.9 % 500 mL IVPB (1,500 mg Intravenous New Bag/Given 09/26/17 2027)  sodium chloride 0.9 % bolus 1,000 mL (1,000 mLs Intravenous New Bag/Given 09/26/17 1936)  sodium chloride 0.9 % bolus 1,000 mL (1,000 mLs Intravenous New Bag/Given 09/26/17 1936)  ceFEPIme (MAXIPIME) 2 g in dextrose 5 % 50 mL IVPB (0 g Intravenous Stopped 09/26/17 2036)     Initial Impression / Assessment and Plan / ED Course  I have reviewed the triage vital signs and the nursing notes.  Pertinent labs & imaging results that were available during my care of the patient were reviewed by me and considered in my medical decision making (see chart for details).     Patient presents with altered mental status and hypotension.  She was discharged from the hospital yesterday after admission for right basilar pneumonia.  She has multiple health problems. Screening chest x-ray and urinalysis show no obvious pathology.  CT head negative for acute findings.  Will aggressively hydrate and start antibiotics  2100:  BP 98-111/55-73 P 79-82.  Mentation has improved.  Will admit to general medicine.   CRITICAL CARE Performed by: Donnetta Hutching Total critical care time: 35 minutes Critical care time was exclusive of separately billable procedures and treating other patients. Critical care was necessary to treat or prevent imminent or life-threatening deterioration. Critical care was time spent personally by me on the following activities: development of treatment plan with patient and/or surrogate as well as nursing, discussions with consultants, evaluation of patient's response to treatment,  examination of patient, obtaining history from patient or surrogate, ordering and performing treatments and interventions, ordering and review of laboratory studies, ordering and review of radiographic studies, pulse oximetry and re-evaluation of patient's condition.  Final Clinical Impressions(s) / ED Diagnoses   Final diagnoses:  Weakness  Altered mental status, unspecified altered mental status type    New Prescriptions New Prescriptions   No medications on file     Donnetta Hutching, MD 09/26/17 2107

## 2017-09-26 NOTE — ED Notes (Signed)
Report given to ICU at this time.  

## 2017-09-26 NOTE — Progress Notes (Addendum)
Pharmacy Antibiotic Note  [name] is a 76 y.o. female admitted on 09/26/2017 with sepsis.  Pharmacy has been consulted for vancomycin and cefepime dosing. She was discharged from hospital yesterday and was treated previously with azithromycin and rocephin for PNA  Plan: Vancomycin 1500 mg IV x 1 then 1gm IV q24 hours Continue cefepime 1gm IV q24 hours F/u renal function, cultures and clinical course     Temp (24hrs), Avg:98.2 F (36.8 C), Min:97.9 F (36.6 C), Max:98.4 F (36.9 C)   Recent Labs Lab 09/20/17 0434 09/23/17 1241 09/26/17 1929  WBC 7.8 7.7  --   CREATININE 1.15* 1.52*  --   LATICACIDVEN  --   --  2.56*    Estimated Creatinine Clearance: 30.8 mL/min (A) (by C-G formula based on SCr of 1.52 mg/dL (H)).    Allergies  Allergen Reactions  . Codeine Other (See Comments)    Reaction:  Headaches   . Food Nausea And Vomiting and Other (See Comments)    Pt is allergic to leafy raw vegetables and seeds.    . Oxycodone Hcl Other (See Comments)    Reaction:  Headaches   . Peanut-Containing Drug Products Nausea And Vomiting  . Reglan [Metoclopramide] Other (See Comments)    Reaction:  Confusion      Thank you for allowing pharmacy to be a part of this patient's care.  Talbert CageSeay, Rahaf Carbonell Poteet 09/26/2017 7:40 PM

## 2017-09-26 NOTE — H&P (Signed)
TRH H&P    Patient Demographics:    [name], is a 76 y.o. female  MRN: [id number]  DOB - 06-04-1941  Admit Date - 09/26/2017  Referring MD/NP/PA: Dr Adriana Simas  Outpatient Primary MD for the patient is Kari Baars, MD  Patient coming from: home   Chief Complaint  Patient presents with  . Weakness      HPI:    [name]  is a 76 y.o. female, history of hypertension,atrial fibrillation, hyperlipidemia CKD stage II who was discharged from the hospital yesterday after she was treated for pneumonia.  She was brought back to the hospital due to confusion and increasing weakness this since this morning. Patient has underlying dementia and is a poor historian.  She is unable to provide any significant history Complains of generalized pains throughout the body.  In the ED lab work revealed acute kidney injury, with creatinine 3.0.  Yesterday it was 1.5 to.  Patient was discharged on Lasix.  In the ED she was found to be hypotensive , lactic acid 2.56. Chest x-ray showed no acute disease.  Patient was started empirically on vancomycin and cefepime for possible healthcare associated pneumonia.    Review of systems:    As in HPI, other systems unobtainable as patient is a poor historian due to underlying dementia.   With Past History of the following :    Past Medical History:  Diagnosis Date  . Anemia   . Anginal pain (HCC)    "all the time, but not heart related"  . Arthritis    "legs and back" (09/30/2013)  . Asthma    years ago  . 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 Blurred vision, bilateral    sometimes double vision  . CAD (coronary artery disease)    multivessel, CABG 6/10.  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  Myoview (11/20/11) showed no ischemia & EF 62%  . Chronic lower back pain   . COPD (chronic obstructive pulmonary disease) (HCC)    severe lung  disease by PFTs 6/12  . DDD (degenerative disc disease)   . Deep vein thrombosis, upper right extremity (HCC)    post-PPM  . Dehiscence of closure of sternum or sternotomy 07/2009   sternal infection; required flap closure  . Diabetes mellitus, type II (HCC)   . Gastroesophageal reflux disease    not much  . H/O hiatal hernia   . History of kidney stones   . Hyperlipidemia   . Hypertension   . Migraines    "years ago" (09/30/2013)  . Obesity   . Orthopnea    sometimes  . Pacemaker   . PUD (peptic ulcer disease)   . Seizures (HCC)    "long time ago; don't remember what they were related to" (09/30/2013)none recent  . Sleep apnea    denies CPAP use on 09/30/2013  . 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 Urge incontinence of urine       Past Surgical History:  Procedure Laterality Date  . ABDOMINAL HYSTERECTOMY     partial  . APPENDECTOMY    . BACK SURGERY     x3 total  . CESAREAN SECTION  ~1980  . CHOLECYSTECTOMY    . COLONOSCOPY  04/27/2005   hemorrhoids, diverticula  . COLONOSCOPY WITH ESOPHAGOGASTRODUODENOSCOPY (EGD) N/A 02/20/2013   RMR: pancolonic diverticulosis and internal hemorrhoids. EGD with small hiatal hernia, healed gastric ulcer  . CORONARY ARTERY BYPASS GRAFT  05/1999   - LIMA to LAD, SVG to OM, SVG to PDA  . ESOPHAG[facility]GASTRODUODENOSCOPY     [facility]  [facility]opsy, and disruption Schatzki's ring  . ESOPHAGOGASTRODUODENOSCOPY  09/10/2012   RMR: Noncritical Schatzki's ring. Diffuse gastric erosions and an  area of partially healing ulceration-status post biopsy/ Small hiatal hernia. Bx reactive gastropathy.  . INSERT / REPLACE / REMOVE PACEMAKER  1993  . JOINT REPLACEMENT    . LUMBAR DISC SURGERY    . PACEMAKER PLACEMENT  1993   Status post DDD pacemaker implantation in 1993, with Medtronic Kappa generator change in 2003 for  second-degree AV block, most recent gen change by Fawn KirkJA 04/14/11  . PORTACATH PLACEMENT Left 07/17/2013   Procedure:  INSERTION PORT-A-CATH-left subclavian;  Surgeon: Fabio BeringBrent C Ziegler, MD;  Location: [facility];  Service: General;  Laterality: Left;  . POSTERIOR LUMBAR FUSION    . RECONSTRUCTIVE REPAIR STERNAL     for infection s/p CABG 8/10  . REPLACEMENT TOTAL KNEE Bilateral   . THYROIDECTOMY    . TOTAL HIP ARTHROPLASTY Right   . TOTAL HIP ARTHROPLASTY Left 12/03/2013   Procedure: LEFT TOTAL HIP ARTHROPLASTY ANTERIOR APPROACH;  Surgeon: Shelda PalMatthew D Olin, MD;  Location: [facility];  Service: Orthopedics;  Laterality: Left;  . TUBAL LIGATION    . WRIST SURGERY Left    "tumor taken off" (09/30/2013)      Social History:      Social History  Substance Use Topics  . Smoking status: Never Smoker  . Smokeless tobacco: Former NeurosurgeonUser    Types: Snuff     Comment: 09/30/2013 "stopped snuff a long time ago"  . Alcohol use No       Family History :     Family History  Problem Relation Age of Onset  . Heart disease Mother        MI  . Colon cancer Father        age 78  . Diabetes Unknown   . Coronary artery disease Unknown        Female <55  . Cancer Unknown       Home Medications:   Prior to Admission medications   Medication Sig Start Date End Date Taking? Authorizing Provider  acetaminophen (TYLENOL) 325 MG tablet Take 325-650 mg by mouth every 6 (six) hours as needed for mild pain, moderate pain or headache.     [provider]  apixaban (ELIQUIS) 5 MG TABS tablet Take 5 mg by mouth 2 (two) times daily.     [provider]  aspirin EC 81 MG tablet Take 81 mg by mouth daily.    [provider]  atorvastatin (LIPITOR) 40 MG tablet Take 40 mg by mouth at bedtime.    [provider]  benazepril (LOTENSIN) 40 MG tablet Take 40 mg by mouth daily.     [provider]  ferrous sulfate 325 (65 FE) MG tablet Take 325 mg by mouth daily with breakfast.    [provider]  furosemide (LASIX) 40 MG tablet  Take 40 mg by mouth daily as needed for fluid.     [provider]  gabapentin (NEURONTIN) 100 MG capsule Take 200 mg by mouth at bedtime.    [provider]  ibuprofen (ADVIL,MOTRIN) 200 MG tablet Take 600 mg by mouth every 6 (six) hours as needed for headache, mild pain or moderate pain.    [provider]  levETIRAcetam (KEPPRA) 500 MG tablet Take 500 mg by mouth 2 (two) times daily.    [provider]  levofloxacin (LEVAQUIN) 500 MG tablet Take 1 tablet (500 mg total) by mouth daily. 09/25/17 09/30/17  Kari Baars, MD  lubiprostone (AMITIZA) 24 MCG capsule Take 24 mcg by mouth 2 (two) times daily with a meal.    [provider]  Melatonin 3 MG TABS Take 3 mg by mouth at bedtime as needed (for sleep).    [provider]  metFORMIN (GLUCOPHAGE) 500 MG tablet Take 500 mg by mouth 2 (two) times daily with a meal.     [provider]  metoprolol tartrate (LOPRESSOR) 25 MG tablet Take 12.5 mg by mouth 2 (two) times daily.     [provider]  nitroGLYCERIN (NITROSTAT) 0.4 MG SL tablet Place 1 tablet (0.4 mg total) under the tongue every 5 (five) minutes as needed for chest pain. 05/20/14   Antoine Poche, MD  omega-3 acid ethyl esters (LOVAZA) 1 G capsule Take 2 g by mouth 2 (two) times daily.     [provider]  ondansetron (ZOFRAN-ODT) 4 MG disintegrating tablet Take 4 mg by mouth every 8 (eight) hours as needed for nausea or vomiting.    [provider]  pantoprazole (PROTONIX) 40 MG tablet Take 1 tablet (40 mg total) by mouth daily. 12/03/13   Gelene Mink, NP  potassium chloride (K-DUR,KLOR-CON) 10 MEQ tablet Take 10 mEq by mouth daily.    [provider]  senna-docusate (SENEXON-S) 8.6-50 MG tablet Take 1 tablet by mouth 2 (two) times daily.    [provider]  traZODone (DESYREL) 50 MG tablet Take 50 mg by mouth at bedtime.    [provider]     Allergies:     Allergies  Allergen Reactions  . Codeine Other (See  Comments)    Reaction:  Headaches   . Food Nausea And Vomiting and Other (See Comments)    Pt is allergic to leafy raw vegetables and seeds.    . Oxycodone Hcl Other (See Comments)    Reaction:  Headaches   . Peanut-Containing Drug Products Nausea And Vomiting  . Reglan [Metoclopramide] Other (See Comments)    Reaction:  Confusion      Physical Exam:   Vitals  Blood pressure 116/69, pulse 75, temperature 97.9 F (36.6 C), resp. rate (!) 23, SpO2 100 %.  1.  General: Appears in no acute distress  2. Psychiatric:  Intact judgement and  insight, awake alert, oriented x 3.  3. Neurologic: No focal neurological deficits, all cranial nerves intact.Strength 5/5 all 4 extremities, sensation intact all 4 extremities, plantars down going.  4. Eyes :  anicteric sclerae, moist conjunctivae with no lid lag. PERRLA.  5. ENMT:  Oropharynx clear with moist mucous membranes and good dentition  6. Neck:  supple, no cervical lymphadenopathy appriciated, No thyromegaly  7. Respiratory : Normal respiratory effort, good air movement bilaterally,clear to  auscultation bilaterally  8. Cardiovascular : RRR, no gallops, rubs or murmurs, no leg edema  9. Gastrointestinal:  Positive bowel  sounds, abdomen soft, non-tender to palpation,no hepatosplenomegaly, no rigidity or guarding       10. Skin:  No cyanosis, normal texture and turgor, no rash, lesions or ulcers  11.Musculoskeletal:  Good muscle tone,  joints appear normal , no effusions,  normal range of motion    Data Review:    CBC  Recent Labs Lab 09/20/17 0434 09/23/17 1241 09/26/17 1926  WBC 7.8 7.7 8.6  HGB 9.7* 11.0* 10.2*  HCT 32.1* 35.6* 34.4*  PLT 268 269 292  MCV 75.9* 75.3* 76.4*  MCH 22.9* 23.3* 22.7*  MCHC 30.2 30.9 29.7*  RDW 19.3* 19.4* 19.7*  LYMPHSABS  --  2.0 2.5  MONOABS  --  0.9 0.7  EOSABS  --  0.6 0.3  BASOSABS  --  0.1 0.1    ------------------------------------------------------------------------------------------------------------------  Chemistries   Recent Labs Lab 09/20/17 0434 09/23/17 1241 09/26/17 1926  NA 141 137 140  K 3.8 4.1 4.1  CL 105 104 106  CO2 24 22 21*  GLUCOSE 117* 115* 127*  BUN 22* 38* 55*  CREATININE 1.15* 1.52* 3.00*  CALCIUM 9.9 9.8 9.7  AST  --   --  25  ALT  --   --  16  ALKPHOS  --   --  75  BILITOT  --   --  0.5   ------------------------------------------------------------------------------------------------------------------  ------------------------------------------------------------------------------------------------------------------ GFR: Estimated Creatinine Clearance: 15.6 mL/min (A) (by C-G formula based on SCr of 3 mg/dL (H)). Liver Function Tests:  Recent Labs Lab 09/26/17 1926  AST 25  ALT 16  ALKPHOS 75  BILITOT 0.5  PROT 7.4  ALBUMIN 3.5   No results for input(s): LIPASE, AMYLASE in the last 168 hours.  Recent Labs Lab 09/20/17 0434  AMMONIA 22   Coagulation Profile:  Recent Labs Lab 09/26/17 1926  INR 1.46   Cardiac Enzymes: No results for input(s): CKTOTAL, CKMB, CKMBINDEX, TROPONINI in the last 168 hours. BNP (last 3 results) No results for input(s): PROBNP in the last 8760 hours. HbA1C: No results for input(s): HGBA1C in the last 72 hours. CBG:  Recent Labs Lab 09/26/17 1921  GLUCAP 130*   Lipid Profile: No results for input(s): CHOL, HDL, LDLCALC, TRIG, CHOLHDL, LDLDIRECT in the last 72 hours. Thyroid Function Tests: No results for input(s): TSH, T4TOTAL, FREET4, T3FREE, THYROIDAB in the last 72 hours. Anemia Panel:  Recent Labs  09/24/17 0704  VITAMINB12 583  FOLATE 29.0    --------------------------------------------------------------------------------------------------------------- Urine analysis:    Component Value Date/Time   COLORURINE YELLOW 09/26/2017 1927   APPEARANCEUR HAZY (A) 09/26/2017 1927    LABSPEC 1.021 09/26/2017 1927   PHURINE 5.0 09/26/2017 1927   GLUCOSEU NEGATIVE 09/26/2017 1927   HGBUR NEGATIVE 09/26/2017 1927   BILIRUBINUR NEGATIVE 09/26/2017 1927   KETONESUR NEGATIVE 09/26/2017 1927   PROTEINUR 30 (A) 09/26/2017 1927   UROBILINOGEN 0.2 02/12/2015 0951   NITRITE NEGATIVE 09/26/2017 1927   LEUKOCYTESUR NEGATIVE 09/26/2017 1927      Imaging Results:    Ct Head Wo Contrast  Result Date: 09/26/2017 CLINICAL DATA:  76 year old discharged from the hospital yesterday with a diagnosis of pneumonia, presenting today with worsening confusion and increasing weakness. EXAM: CT HEAD WITHOUT CONTRAST TECHNIQUE: Contiguous axial images were obtained from the base of the skull through the vertex without intravenous contrast. COMPARISON:  09/19/2017, 09/24/2016 and earlier. FINDINGS: Patient motion degraded images of the skull base. Brain: Mild cortical atrophy and moderate deep atrophy, unchanged dating back to 2014. Moderate to severe changes of small vessel  disease of the white matter diffusely, unchanged. 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 No mass lesion. No midline shift. No acute hemorrhage or hematoma. No extra-axial fluid collections. No evidence of acute infarction. Vascular: Severe atherosclerosis involving the left vertebral artery. Severe bilateral carotid siphon atherosclerosis. No hyperdense vessel. Skull: No skull fracture or other focal osseous abnormality involving the skull. Sinuses/Orbits: Visualized paranasal sinuses, bilateral mastoid air cells and bilateral middle ear cavities well-aerated. Visualized orbits and globes are normal. Other: None. IMPRESSION: 1. No acute intracranial abnormality. 2. Stable generalized atrophy and moderate to severe chronic microvascular ischemic changes of the white matter. Electronically Signed   By: Hulan Saas M.D.   On: 09/26/2017 20:49   Dg Chest Port 1 View  Result Date: 09/26/2017 CLINICAL  DATA:  Weakness. The patient was discharged from the hospital yesterday after being treated for pneumonia. EXAM: PORTABLE CHEST 1 VIEW COMPARISON:  PA and lateral chest 08/29/2016 and 09/19/2017. FINDINGS: The patient is rotated on the exam. There is cardiomegaly without edema. No pneumothorax or pleural effusion. Lungs clear. Pacing device is in place. Aortic atherosclerosis is noted. IMPRESSION: Cardiomegaly without acute disease. Electronically Signed   By: Drusilla Kanner M.D.   On: 09/26/2017 19:55    My personal review of EKG: Rhythm paced rhythm   Assessment & Plan:    Active Problems:   HCAP (healthcare-associated pneumonia)   1. Healthcare associated pneumonia-patient was discharged with Levaquin for pneumonia.  She has been started on vancomycin and cefepime.  Lactic acid is 2.56.  Will check lactic acid every 3 hours.  Once patient clinically improves she can be switched to p.o. antibiotics as per discretion of PCP. 2. Acute kidney injury on CKD stage II-patient's creatinine 3.02 , previous creatinine was 1.52.  Will hold Lasix.  Start IV normal saline at 100 mL/h.  Follow BMP in a.m. 3. Diabetes mellitus-hold metformin, start sliding scale insulin with NovoLog. 4. Atrial fibrillation-heart rate is controlled, continue apixaban.  Metoprolol is held due to hypotension.  Consider restarting metoprolol in a.m if blood pressure is stable. 5. Hypertension-we will hold antihypertensive medications including metoprolol, benazepril, Lasix.    DVT Prophylaxis-   Apixaban  AM Labs Ordered, also please review Full Orders  Family Communication: No family at bedside  Code Status: Full code  Admission status: Observation  Time spent in minutes : 60 min   Semya Klinke S M.D on 09/26/2017 at 9:46 PM  Between 7am to 7pm - Pager - [phone]. After 7pm go to [email or web address] - password [facility]  [facility] - Office  [phone]

## 2017-09-26 NOTE — ED Triage Notes (Signed)
Pt was discharged from hospital yesterday for diagnosis of pneumonia, daughter states that pt has had intermittent confusion since the beginning of the hospital admission, daughter is concerned that pt has had increased weakness since this am. Pt is responsiveness to painful stimuli.

## 2017-09-27 DIAGNOSIS — R531 Weakness: Secondary | ICD-10-CM | POA: Diagnosis present

## 2017-09-27 DIAGNOSIS — Z87891 Personal history of nicotine dependence: Secondary | ICD-10-CM | POA: Diagnosis not present

## 2017-09-27 DIAGNOSIS — J440 Chronic obstructive pulmonary disease with acute lower respiratory infection: Secondary | ICD-10-CM | POA: Diagnosis present

## 2017-09-27 DIAGNOSIS — I2510 Atherosclerotic heart disease of native coronary artery without angina pectoris: Secondary | ICD-10-CM | POA: Diagnosis present

## 2017-09-27 DIAGNOSIS — Z7901 Long term (current) use of anticoagulants: Secondary | ICD-10-CM | POA: Diagnosis not present

## 2017-09-27 DIAGNOSIS — F0390 Unspecified dementia without behavioral disturbance: Secondary | ICD-10-CM | POA: Diagnosis present

## 2017-09-27 DIAGNOSIS — Z90710 Acquired absence of both cervix and uterus: Secondary | ICD-10-CM | POA: Diagnosis not present

## 2017-09-27 DIAGNOSIS — N183 Chronic kidney disease, stage 3 (moderate): Secondary | ICD-10-CM | POA: Diagnosis present

## 2017-09-27 DIAGNOSIS — E860 Dehydration: Secondary | ICD-10-CM | POA: Diagnosis present

## 2017-09-27 DIAGNOSIS — Z66 Do not resuscitate: Secondary | ICD-10-CM | POA: Diagnosis present

## 2017-09-27 DIAGNOSIS — G9341 Metabolic encephalopathy: Secondary | ICD-10-CM | POA: Diagnosis present

## 2017-09-27 DIAGNOSIS — K219 Gastro-esophageal reflux disease without esophagitis: Secondary | ICD-10-CM | POA: Diagnosis present

## 2017-09-27 DIAGNOSIS — E1122 Type 2 diabetes mellitus with diabetic chronic kidney disease: Secondary | ICD-10-CM | POA: Diagnosis present

## 2017-09-27 DIAGNOSIS — Z950 Presence of cardiac pacemaker: Secondary | ICD-10-CM | POA: Diagnosis not present

## 2017-09-27 DIAGNOSIS — Z951 Presence of aortocoronary bypass graft: Secondary | ICD-10-CM | POA: Diagnosis not present

## 2017-09-27 DIAGNOSIS — I959 Hypotension, unspecified: Secondary | ICD-10-CM | POA: Diagnosis present

## 2017-09-27 DIAGNOSIS — E890 Postprocedural hypothyroidism: Secondary | ICD-10-CM | POA: Diagnosis present

## 2017-09-27 DIAGNOSIS — I5032 Chronic diastolic (congestive) heart failure: Secondary | ICD-10-CM | POA: Diagnosis present

## 2017-09-27 DIAGNOSIS — D638 Anemia in other chronic diseases classified elsewhere: Secondary | ICD-10-CM | POA: Diagnosis present

## 2017-09-27 DIAGNOSIS — I482 Chronic atrial fibrillation: Secondary | ICD-10-CM | POA: Diagnosis present

## 2017-09-27 DIAGNOSIS — Z8673 Personal history of transient ischemic attack (TIA), and cerebral infarction without residual deficits: Secondary | ICD-10-CM | POA: Diagnosis not present

## 2017-09-27 DIAGNOSIS — R296 Repeated falls: Secondary | ICD-10-CM | POA: Diagnosis present

## 2017-09-27 DIAGNOSIS — N179 Acute kidney failure, unspecified: Secondary | ICD-10-CM | POA: Diagnosis present

## 2017-09-27 DIAGNOSIS — I130 Hypertensive heart and chronic kidney disease with heart failure and stage 1 through stage 4 chronic kidney disease, or unspecified chronic kidney disease: Secondary | ICD-10-CM | POA: Diagnosis present

## 2017-09-27 DIAGNOSIS — J189 Pneumonia, unspecified organism: Secondary | ICD-10-CM | POA: Diagnosis present

## 2017-09-27 DIAGNOSIS — Y95 Nosocomial condition: Secondary | ICD-10-CM | POA: Diagnosis present

## 2017-09-27 LAB — BLOOD GAS, ARTERIAL
ACID-BASE DEFICIT: 7.6 mmol/L — AB (ref 0.0–2.0)
BICARBONATE: 18.1 mmol/L — AB (ref 20.0–28.0)
DRAWN BY: 277331
FIO2: 0.21
O2 SAT: 96.1 %
PATIENT TEMPERATURE: 37
pCO2 arterial: 37.5 mmHg (ref 32.0–48.0)
pH, Arterial: 7.295 — ABNORMAL LOW (ref 7.350–7.450)
pO2, Arterial: 94.6 mmHg (ref 83.0–108.0)

## 2017-09-27 LAB — COMPREHENSIVE METABOLIC PANEL
ALBUMIN: 2.8 g/dL — AB (ref 3.5–5.0)
ALT: 14 U/L (ref 14–54)
ANION GAP: 7 (ref 5–15)
AST: 20 U/L (ref 15–41)
Alkaline Phosphatase: 57 U/L (ref 38–126)
BUN: 48 mg/dL — AB (ref 6–20)
CHLORIDE: 112 mmol/L — AB (ref 101–111)
CO2: 20 mmol/L — AB (ref 22–32)
Calcium: 8.3 mg/dL — ABNORMAL LOW (ref 8.9–10.3)
Creatinine, Ser: 2.34 mg/dL — ABNORMAL HIGH (ref 0.44–1.00)
GFR calc Af Amer: 22 mL/min — ABNORMAL LOW (ref >60)
GFR calc non Af Amer: 19 mL/min — ABNORMAL LOW (ref >60)
GLUCOSE: 101 mg/dL — AB (ref 65–99)
POTASSIUM: 3.8 mmol/L (ref 3.5–5.1)
Sodium: 139 mmol/L (ref 135–145)
Total Bilirubin: 0.5 mg/dL (ref 0.3–1.2)
Total Protein: 5.9 g/dL — ABNORMAL LOW (ref 6.5–8.1)

## 2017-09-27 LAB — CBC
HEMATOCRIT: 29.7 % — AB (ref 36.0–46.0)
Hemoglobin: 8.8 g/dL — ABNORMAL LOW (ref 12.0–15.0)
MCH: 23.2 pg — ABNORMAL LOW (ref 26.0–34.0)
MCHC: 29.6 g/dL — ABNORMAL LOW (ref 30.0–36.0)
MCV: 78.2 fL (ref 78.0–100.0)
Platelets: 238 10*3/uL (ref 150–400)
RBC: 3.8 MIL/uL — AB (ref 3.87–5.11)
RDW: 19.8 % — AB (ref 11.5–15.5)
WBC: 7.8 10*3/uL (ref 4.0–10.5)

## 2017-09-27 LAB — GLUCOSE, CAPILLARY
GLUCOSE-CAPILLARY: 85 mg/dL (ref 65–99)
Glucose-Capillary: 97 mg/dL (ref 65–99)
Glucose-Capillary: 116 mg/dL — ABNORMAL HIGH (ref 65–99)
Glucose-Capillary: 155 mg/dL — ABNORMAL HIGH (ref 65–99)

## 2017-09-27 LAB — AMMONIA: Ammonia: 19 umol/L (ref 9–35)

## 2017-09-27 MED ORDER — LORAZEPAM 2 MG/ML IJ SOLN
0.5000 mg | INTRAMUSCULAR | Status: AC | PRN
  Administered 2017-09-28 (×2): 0.5 mg via INTRAVENOUS
  Filled 2017-09-27 (×2): qty 1

## 2017-09-27 MED ORDER — LORAZEPAM 2 MG/ML IJ SOLN: 0.5000 mg | INTRAMUSCULAR | Status: DC | PRN

## 2017-09-27 MED ORDER — LORAZEPAM 2 MG/ML IJ SOLN
0.7500 mg | Freq: Once | INTRAMUSCULAR | Status: AC
  Administered 2017-09-28: 0.75 mg via INTRAVENOUS
  Filled 2017-09-27: qty 1

## 2017-09-27 MED ORDER — LORAZEPAM 2 MG/ML IJ SOLN
2.0000 mg | Freq: Once | INTRAMUSCULAR | Status: AC
  Administered 2017-09-27: 2 mg via INTRAVENOUS
  Filled 2017-09-27: qty 1

## 2017-09-27 NOTE — Care Management Note (Signed)
Case Management Note  Patient Details  Name: [name] MRN: [id number] Date of Birth: January 13, 1941  Subjective/Objective:  Patient admit with ?PNA (CXR -), confusion, weakness, AKI. Discharged from hospital on 09/25/2017. Pt lives at home with son and DIL.  Pt is ind with ADL's prior to recent hospitalizations. [facility]   Action/Plan: CM following for needs.    Expected Discharge Date:      unk            Expected Discharge Plan:     In-House Referral:     Discharge planning Services  CM Consult  Post Acute Care Choice:    Choice offered to:     DME Arranged:    DME Agency:     HH Arranged:    HH Agency:     Status of Service:  In process, will continue to follow  If discussed at Long Length of Stay Meetings, dates discussed:    Additional Comments:  Shigeo Baugh, Chrystine OilerSharley Diane, RN 09/27/2017, 3:24 PM

## 2017-09-27 NOTE — Progress Notes (Signed)
Subjective: She was readmitted last night after having been discharged on the 22nd after having pneumonia. She had some trouble with confusion during that hospitalization which frequently happens to her when she is out of her normal environment. It was felt that she might improve as far as her mental status is concerned when she went home and was back in her normal environment. Her pneumonia was much improved. However she continued to have trouble with confusion and was very weak. Her daughter said that she was eating and drinking fairly well at home. This morning she is poorly responsive. She appeared to have acute kidney injury when she came to the emergency department. Chest x-ray which I have personally reviewed is clear and does not show pneumonia. However her lactate level was elevated.  Objective: Vital signs in last 24 hours: Temp:  [97.5 F (36.4 C)-98.6 F (37 C)] 97.5 F (36.4 C) (10/24 0000) Pulse Rate:  [66-88] 66 (10/24 0800) Resp:  [12-34] 23 (10/24 0800) BP: (71-123)/(41-87) 96/70 (10/24 0800) SpO2:  [91 %-100 %] 100 % (10/24 0800) Weight:  [87.9 kg (193 lb 12.6 oz)] 87.9 kg (193 lb 12.6 oz) (10/24 0739) Weight change:  Last BM Date: 09/27/17  Intake/Output from previous day: 10/23 0701 - 10/24 0700 In: 2550 [IV Piggyback:2550] Out: 300 [Urine:300]  PHYSICAL EXAM General appearance: Sleeping. Poorly responsive Resp: rhonchi bilaterally Cardio: irregularly irregular rhythm GI: soft, non-tender; bowel sounds normal; no masses,  no organomegaly Extremities: extremities normal, atraumatic, no cyanosis or edema Skin turgor fair  Lab Results:  Results for orders placed or performed during the hospital encounter of 09/26/17 (from the past 48 hour(s))  CBG monitoring, ED     Status: Abnormal   Collection Time: 09/26/17  7:21 PM  Result Value Ref Range   Glucose-Capillary 130 (H) 65 - 99 mg/dL  Comprehensive metabolic panel     Status: Abnormal   Collection Time: 09/26/17   7:26 PM  Result Value Ref Range   Sodium 140 135 - 145 mmol/L   Potassium 4.1 3.5 - 5.1 mmol/L   Chloride 106 101 - 111 mmol/L   CO2 21 (L) 22 - 32 mmol/L   Glucose, Bld 127 (H) 65 - 99 mg/dL   BUN 55 (H) 6 - 20 mg/dL   Creatinine, Ser 3.00 (H) 0.44 - 1.00 mg/dL   Calcium 9.7 8.9 - 10.3 mg/dL   Total Protein 7.4 6.5 - 8.1 g/dL   Albumin 3.5 3.5 - 5.0 g/dL   AST 25 15 - 41 U/L   ALT 16 14 - 54 U/L   Alkaline Phosphatase 75 38 - 126 U/L   Total Bilirubin 0.5 0.3 - 1.2 mg/dL   GFR calc non Af Amer 14 (L) >60 mL/min   GFR calc Af Amer 16 (L) >60 mL/min    Comment: (NOTE) The eGFR has been calculated using the CKD EPI equation. This calculation has not been validated in all clinical situations. eGFR's persistently <60 mL/min signify possible Chronic Kidney Disease.    Anion gap 13 5 - 15  CBC with Differential     Status: Abnormal   Collection Time: 09/26/17  7:26 PM  Result Value Ref Range   WBC 8.6 4.0 - 10.5 K/uL   RBC 4.50 3.87 - 5.11 MIL/uL   Hemoglobin 10.2 (L) 12.0 - 15.0 g/dL   HCT 34.4 (L) 36.0 - 46.0 %   MCV 76.4 (L) 78.0 - 100.0 fL   MCH 22.7 (L) 26.0 - 34.0 pg  MCHC 29.7 (L) 30.0 - 36.0 g/dL   RDW 19.7 (H) 11.5 - 15.5 %   Platelets 292 150 - 400 K/uL   Neutrophils Relative % 58 %   Neutro Abs 5.0 1.7 - 7.7 K/uL   Lymphocytes Relative 29 %   Lymphs Abs 2.5 0.7 - 4.0 K/uL   Monocytes Relative 8 %   Monocytes Absolute 0.7 0.1 - 1.0 K/uL   Eosinophils Relative 4 %   Eosinophils Absolute 0.3 0.0 - 0.7 K/uL   Basophils Relative 1 %   Basophils Absolute 0.1 0.0 - 0.1 K/uL  Protime-INR     Status: Abnormal   Collection Time: 09/26/17  7:26 PM  Result Value Ref Range   Prothrombin Time 17.6 (H) 11.4 - 15.2 seconds   INR 1.46   Culture, blood (Routine x 2)     Status: None (Preliminary result)   Collection Time: 09/26/17  7:27 PM  Result Value Ref Range   Specimen Description BLOOD PORT DRAW    Special Requests      BOTTLES DRAWN AEROBIC ONLY Blood Culture  adequate volume   Culture NO GROWTH < 12 HOURS    Report Status PENDING   Culture, blood (Routine x 2)     Status: None (Preliminary result)   Collection Time: 09/26/17  7:27 PM  Result Value Ref Range   Specimen Description BLOOD PORT DRAW    Special Requests      BOTTLES DRAWN AEROBIC AND ANAEROBIC Blood Culture adequate volume   Culture NO GROWTH < 12 HOURS    Report Status PENDING   Urinalysis, Routine w reflex microscopic     Status: Abnormal   Collection Time: 09/26/17  7:27 PM  Result Value Ref Range   Color, Urine YELLOW YELLOW   APPearance HAZY (A) CLEAR   Specific Gravity, Urine 1.021 1.005 - 1.030   pH 5.0 5.0 - 8.0   Glucose, UA NEGATIVE NEGATIVE mg/dL   Hgb urine dipstick NEGATIVE NEGATIVE   Bilirubin Urine NEGATIVE NEGATIVE   Ketones, ur NEGATIVE NEGATIVE mg/dL   Protein, ur 30 (A) NEGATIVE mg/dL   Nitrite NEGATIVE NEGATIVE   Leukocytes, UA NEGATIVE NEGATIVE   RBC / HPF 0-5 0 - 5 RBC/hpf   WBC, UA 0-5 0 - 5 WBC/hpf   Bacteria, UA NONE SEEN NONE SEEN   Squamous Epithelial / LPF 0-5 (A) NONE SEEN   Hyaline Casts, UA PRESENT   I-Stat CG4 Lactic Acid, ED     Status: Abnormal   Collection Time: 09/26/17  7:29 PM  Result Value Ref Range   Lactic Acid, Venous 2.56 (HH) 0.5 - 1.9 mmol/L   Comment NOTIFIED PHYSICIAN   I-Stat CG4 Lactic Acid, ED     Status: None   Collection Time: 09/26/17  9:48 PM  Result Value Ref Range   Lactic Acid, Venous 1.69 0.5 - 1.9 mmol/L  CBC     Status: Abnormal   Collection Time: 09/27/17  7:48 AM  Result Value Ref Range   WBC 7.8 4.0 - 10.5 K/uL   RBC 3.80 (L) 3.87 - 5.11 MIL/uL   Hemoglobin 8.8 (L) 12.0 - 15.0 g/dL   HCT 29.7 (L) 36.0 - 46.0 %   MCV 78.2 78.0 - 100.0 fL   MCH 23.2 (L) 26.0 - 34.0 pg   MCHC 29.6 (L) 30.0 - 36.0 g/dL   RDW 19.8 (H) 11.5 - 15.5 %   Platelets 238 150 - 400 K/uL  Glucose, capillary     Status: None  Collection Time: 09/27/17  8:19 AM  Result Value Ref Range   Glucose-Capillary 85 65 - 99 mg/dL     ABGS No results for input(s): PHART, PO2ART, TCO2, HCO3 in the last 72 hours.  Invalid input(s): PCO2 CULTURES Recent Results (from the past 240 hour(s))  MRSA PCR Screening     Status: Abnormal   Collection Time: 09/20/17 12:47 AM  Result Value Ref Range Status   MRSA by PCR POSITIVE (A) NEGATIVE Final    Comment: RESULT CALLED TO, READ BACK BY AND VERIFIED WITH: TETREAULT,H '@0336'$  BY MATTHEWS, B 10.17.18        The GeneXpert MRSA Assay (FDA approved for NASAL specimens only), is one component of a comprehensive MRSA colonization surveillance program. It is not intended to diagnose MRSA infection nor to guide or monitor treatment for MRSA infections.   Culture, blood (Routine x 2)     Status: None (Preliminary result)   Collection Time: 09/26/17  7:27 PM  Result Value Ref Range Status   Specimen Description BLOOD PORT DRAW  Final   Special Requests   Final    BOTTLES DRAWN AEROBIC ONLY Blood Culture adequate volume   Culture NO GROWTH < 12 HOURS  Final   Report Status PENDING  Incomplete  Culture, blood (Routine x 2)     Status: None (Preliminary result)   Collection Time: 09/26/17  7:27 PM  Result Value Ref Range Status   Specimen Description BLOOD PORT DRAW  Final   Special Requests   Final    BOTTLES DRAWN AEROBIC AND ANAEROBIC Blood Culture adequate volume   Culture NO GROWTH < 12 HOURS  Final   Report Status PENDING  Incomplete   Studies/Results: Ct Head Wo Contrast  Result Date: 09/26/2017 CLINICAL DATA:  76 year old discharged from the hospital yesterday with a diagnosis of pneumonia, presenting today with worsening confusion and increasing weakness. EXAM: CT HEAD WITHOUT CONTRAST TECHNIQUE: Contiguous axial images were obtained from the base of the skull through the vertex without intravenous contrast. COMPARISON:  09/19/2017, 09/24/2016 and earlier. FINDINGS: Patient motion degraded images of the skull base. Brain: Mild cortical atrophy and moderate deep  atrophy, unchanged dating back to 2014. Moderate to severe changes of small vessel disease of the white matter diffusely, unchanged. 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 No mass lesion. No midline shift. No acute hemorrhage or hematoma. No extra-axial fluid collections. No evidence of acute infarction. Vascular: Severe atherosclerosis involving the left vertebral artery. Severe bilateral carotid siphon atherosclerosis. No hyperdense vessel. Skull: No skull fracture or other focal osseous abnormality involving the skull. Sinuses/Orbits: Visualized paranasal sinuses, bilateral mastoid air cells and bilateral middle ear cavities well-aerated. Visualized orbits and globes are normal. Other: None. IMPRESSION: 1. No acute intracranial abnormality. 2. Stable generalized atrophy and moderate to severe chronic microvascular ischemic changes of the white matter. Electronically Signed   By: Evangeline Dakin M.D.   On: 09/26/2017 20:49   Dg Chest Port 1 View  Result Date: 09/26/2017 CLINICAL DATA:  Weakness. The patient was discharged from the hospital yesterday after being treated for pneumonia. EXAM: PORTABLE CHEST 1 VIEW COMPARISON:  PA and lateral chest 08/29/2016 and 09/19/2017. FINDINGS: The patient is rotated on the exam. There is cardiomegaly without edema. No pneumothorax or pleural effusion. Lungs clear. Pacing device is in place. Aortic atherosclerosis is noted. IMPRESSION: Cardiomegaly without acute disease. Electronically Signed   By: Inge Rise M.D.   On: 09/26/2017 19:55    Medications:  Prior to Admission:  Prescriptions Prior to Admission  Medication Sig Dispense Refill Last Dose  . acetaminophen (TYLENOL) 325 MG tablet Take 325-650 mg by mouth every 6 (six) hours as needed for mild pain, moderate pain or headache.    Past Month at Unknown time  . apixaban (ELIQUIS) 5 MG TABS tablet Take 5 mg by mouth 2 (two) times daily.    09/26/2017 at 1700  . aspirin EC 81 MG  tablet Take 81 mg by mouth daily.   09/26/2017 at Unknown time  . atorvastatin (LIPITOR) 40 MG tablet Take 40 mg by mouth at bedtime.   09/26/2017 at Unknown time  . benazepril (LOTENSIN) 40 MG tablet Take 40 mg by mouth daily.    09/26/2017 at Unknown time  . ferrous sulfate 325 (65 FE) MG tablet Take 325 mg by mouth daily with breakfast.   09/26/2017 at Unknown time  . furosemide (LASIX) 40 MG tablet Take 40 mg by mouth daily as needed for fluid.   09/26/2017 at Unknown time  . gabapentin (NEURONTIN) 100 MG capsule Take 200 mg by mouth at bedtime.   09/26/2017 at Unknown time  . ibuprofen (ADVIL,MOTRIN) 200 MG tablet Take 600 mg by mouth every 6 (six) hours as needed for headache, mild pain or moderate pain.   Past Month at Unknown time  . levETIRAcetam (KEPPRA) 500 MG tablet Take 500 mg by mouth 2 (two) times daily.   09/26/2017 at 1700  . lubiprostone (AMITIZA) 24 MCG capsule Take 24 mcg by mouth 2 (two) times daily with a meal.   09/26/2017 at Unknown time  . Melatonin 3 MG TABS Take 3 mg by mouth at bedtime as needed (for sleep).   Past Month at Unknown time  . metFORMIN (GLUCOPHAGE) 500 MG tablet Take 500 mg by mouth 2 (two) times daily with a meal.    09/26/2017 at Unknown time  . metoprolol tartrate (LOPRESSOR) 25 MG tablet Take 12.5 mg by mouth 2 (two) times daily.    09/26/2017 at 1700  . nitroGLYCERIN (NITROSTAT) 0.4 MG SL tablet Place 1 tablet (0.4 mg total) under the tongue every 5 (five) minutes as needed for chest pain. 25 tablet 1 unknown  . omega-3 acid ethyl esters (LOVAZA) 1 G capsule Take 2 g by mouth 2 (two) times daily.    09/26/2017 at Unknown time  . ondansetron (ZOFRAN-ODT) 4 MG disintegrating tablet Take 4 mg by mouth every 8 (eight) hours as needed for nausea or vomiting.   Past Month at Unknown time  . pantoprazole (PROTONIX) 40 MG tablet Take 1 tablet (40 mg total) by mouth daily. 30 tablet 3 09/26/2017 at Unknown time  . potassium chloride (K-DUR,KLOR-CON) 10 MEQ tablet  Take 10 mEq by mouth daily.   09/26/2017 at Unknown time  . senna-docusate (SENEXON-S) 8.6-50 MG tablet Take 1 tablet by mouth 2 (two) times daily.   09/26/2017 at Unknown time  . traZODone (DESYREL) 50 MG tablet Take 50 mg by mouth at bedtime.   09/26/2017 at Unknown time   Scheduled: . apixaban  5 mg Oral BID  . aspirin EC  81 mg Oral Daily  . atorvastatin  40 mg Oral QHS  . gabapentin  200 mg Oral QHS  . insulin aspart  0-9 Units Subcutaneous TID WC  . levETIRAcetam  500 mg Oral BID  . lubiprostone  24 mcg Oral BID WC  . pantoprazole  40 mg Oral Daily  . senna-docusate  1 tablet Oral BID  .  traZODone  50 mg Oral QHS   Continuous: . sodium chloride 100 mL/hr at 09/27/17 0800  . ceFEPime (MAXIPIME) IV    . vancomycin     WUJ:[id number] **OR** acetaminophen, ondansetron **OR** ondansetron (ZOFRAN) IV  Assesment: She was admitted with altered mental status and has been started on empiric antibiotics. Chest x-ray does not show pneumonia but I will repeat that. She would be high risk for aspiration with her altered mental status. She had elevated lactate level but that's better. She had acute kidney injury and her renal function panel is pending. Active Problems:   HCAP (healthcare-associated pneumonia)    Plan: Continue treatments. Continue IV fluids and antibiotics. Check arterial blood gas. Check ammonia level.    LOS: 0 days   Wessley Emert L 09/27/2017, 8:34 AM

## 2017-09-28 ENCOUNTER — Inpatient Hospital Stay (HOSPITAL_COMMUNITY): Payer: Medicare HMO

## 2017-09-28 LAB — CBC WITH DIFFERENTIAL/PLATELET
BASOS ABS: 0.1 10*3/uL (ref 0.0–0.1)
BASOS PCT: 1 %
EOS ABS: 0.6 10*3/uL (ref 0.0–0.7)
Eosinophils Relative: 11 %
HCT: 30.5 % — ABNORMAL LOW (ref 36.0–46.0)
HEMOGLOBIN: 9 g/dL — AB (ref 12.0–15.0)
Lymphocytes Relative: 21 %
Lymphs Abs: 1.2 10*3/uL (ref 0.7–4.0)
MCH: 23 pg — ABNORMAL LOW (ref 26.0–34.0)
MCHC: 29.5 g/dL — AB (ref 30.0–36.0)
MCV: 77.8 fL — ABNORMAL LOW (ref 78.0–100.0)
MONOS PCT: 7 %
Monocytes Absolute: 0.4 10*3/uL (ref 0.1–1.0)
NEUTROS ABS: 3.6 10*3/uL (ref 1.7–7.7)
NEUTROS PCT: 60 %
Platelets: 215 10*3/uL (ref 150–400)
RBC: 3.92 MIL/uL (ref 3.87–5.11)
RDW: 19.8 % — AB (ref 11.5–15.5)
WBC: 5.9 10*3/uL (ref 4.0–10.5)

## 2017-09-28 LAB — BASIC METABOLIC PANEL
ANION GAP: 6 (ref 5–15)
BUN: 28 mg/dL — ABNORMAL HIGH (ref 6–20)
CHLORIDE: 115 mmol/L — AB (ref 101–111)
CO2: 21 mmol/L — ABNORMAL LOW (ref 22–32)
CREATININE: 1.32 mg/dL — AB (ref 0.44–1.00)
Calcium: 9 mg/dL (ref 8.9–10.3)
GFR calc non Af Amer: 38 mL/min — ABNORMAL LOW (ref >60)
GFR, EST AFRICAN AMERICAN: 44 mL/min — AB (ref >60)
Glucose, Bld: 107 mg/dL — ABNORMAL HIGH (ref 65–99)
Potassium: 4 mmol/L (ref 3.5–5.1)
SODIUM: 142 mmol/L (ref 135–145)

## 2017-09-28 LAB — GLUCOSE, CAPILLARY
GLUCOSE-CAPILLARY: 106 mg/dL — AB (ref 65–99)
GLUCOSE-CAPILLARY: 99 mg/dL (ref 65–99)
Glucose-Capillary: 100 mg/dL — ABNORMAL HIGH (ref 65–99)
Glucose-Capillary: 98 mg/dL (ref 65–99)

## 2017-09-28 LAB — HEMOGLOBIN A1C
HEMOGLOBIN A1C: 6 % — AB (ref 4.8–5.6)
Mean Plasma Glucose: 126 mg/dL

## 2017-09-28 MED ORDER — HYDRALAZINE HCL 20 MG/ML IJ SOLN
10.0000 mg | INTRAMUSCULAR | Status: DC | PRN
  Administered 2017-09-28 – 2017-09-29 (×3): 10 mg via INTRAVENOUS
  Filled 2017-09-28 (×3): qty 1

## 2017-09-28 MED ORDER — METOPROLOL TARTRATE 25 MG PO TABS
12.5000 mg | ORAL_TABLET | Freq: Two times a day (BID) | ORAL | Status: DC
  Administered 2017-09-28 – 2017-09-30 (×5): 12.5 mg via ORAL
  Filled 2017-09-28 (×7): qty 1

## 2017-09-28 MED ORDER — METOPROLOL TARTRATE 5 MG/5ML IV SOLN
5.0000 mg | Freq: Once | INTRAVENOUS | Status: AC
Start: 2017-09-28 — End: 2017-09-28
  Administered 2017-09-28: 5 mg via INTRAVENOUS
  Filled 2017-09-28: qty 5

## 2017-09-28 NOTE — Progress Notes (Signed)
Subjective: She is still sleepy this morning. She became more arousable last night and became agitated and received Ativan for that. We still don't have a source of infection.  Objective: Vital signs in last 24 hours: Temp:  [97.6 F (36.4 C)-98.4 F (36.9 C)] 97.6 F (36.4 C) (10/25 0400) Pulse Rate:  [74-92] 80 (10/25 0610) Resp:  [18-31] 21 (10/25 0610) BP: (135-188)/(79-110) 188/109 (10/25 0610) SpO2:  [96 %-100 %] 96 % (10/25 0610) Weight:  [87.9 kg (193 lb 12.6 oz)] 87.9 kg (193 lb 12.6 oz) (10/25 0500) Weight change:  Last BM Date: 09/27/17  Intake/Output from previous day: 10/24 0701 - 10/25 0700 In: 1786.7 [I.V.:1786.7] Out: 3050 [Urine:3050]  PHYSICAL EXAM General appearance: Sleepy but arousable Resp: clear to auscultation bilaterally Cardio: irregularly irregular rhythm GI: soft, non-tender; bowel sounds normal; no masses,  no organomegaly Extremities: extremities normal, atraumatic, no cyanosis or edema Skin warm and dry  Lab Results:  Results for orders placed or performed during the hospital encounter of 09/26/17 (from the past 48 hour(s))  CBG monitoring, ED     Status: Abnormal   Collection Time: 09/26/17  7:21 PM  Result Value Ref Range   Glucose-Capillary 130 (H) 65 - 99 mg/dL  Comprehensive metabolic panel     Status: Abnormal   Collection Time: 09/26/17  7:26 PM  Result Value Ref Range   Sodium 140 135 - 145 mmol/L   Potassium 4.1 3.5 - 5.1 mmol/L   Chloride 106 101 - 111 mmol/L   CO2 21 (L) 22 - 32 mmol/L   Glucose, Bld 127 (H) 65 - 99 mg/dL   BUN 55 (H) 6 - 20 mg/dL   Creatinine, Ser 3.00 (H) 0.44 - 1.00 mg/dL   Calcium 9.7 8.9 - 10.3 mg/dL   Total Protein 7.4 6.5 - 8.1 g/dL   Albumin 3.5 3.5 - 5.0 g/dL   AST 25 15 - 41 U/L   ALT 16 14 - 54 U/L   Alkaline Phosphatase 75 38 - 126 U/L   Total Bilirubin 0.5 0.3 - 1.2 mg/dL   GFR calc non Af Amer 14 (L) >60 mL/min   GFR calc Af Amer 16 (L) >60 mL/min    Comment: (NOTE) The eGFR has been  calculated using the CKD EPI equation. This calculation has not been validated in all clinical situations. eGFR's persistently <60 mL/min signify possible Chronic Kidney Disease.    Anion gap 13 5 - 15  CBC with Differential     Status: Abnormal   Collection Time: 09/26/17  7:26 PM  Result Value Ref Range   WBC 8.6 4.0 - 10.5 K/uL   RBC 4.50 3.87 - 5.11 MIL/uL   Hemoglobin 10.2 (L) 12.0 - 15.0 g/dL   HCT 34.4 (L) 36.0 - 46.0 %   MCV 76.4 (L) 78.0 - 100.0 fL   MCH 22.7 (L) 26.0 - 34.0 pg   MCHC 29.7 (L) 30.0 - 36.0 g/dL   RDW 19.7 (H) 11.5 - 15.5 %   Platelets 292 150 - 400 K/uL   Neutrophils Relative % 58 %   Neutro Abs 5.0 1.7 - 7.7 K/uL   Lymphocytes Relative 29 %   Lymphs Abs 2.5 0.7 - 4.0 K/uL   Monocytes Relative 8 %   Monocytes Absolute 0.7 0.1 - 1.0 K/uL   Eosinophils Relative 4 %   Eosinophils Absolute 0.3 0.0 - 0.7 K/uL   Basophils Relative 1 %   Basophils Absolute 0.1 0.0 - 0.1 K/uL  Protime-INR  Status: Abnormal   Collection Time: 09/26/17  7:26 PM  Result Value Ref Range   Prothrombin Time 17.6 (H) 11.4 - 15.2 seconds   INR 1.46   Culture, blood (Routine x 2)     Status: None (Preliminary result)   Collection Time: 09/26/17  7:27 PM  Result Value Ref Range   Specimen Description BLOOD PORT DRAW    Special Requests      BOTTLES DRAWN AEROBIC ONLY Blood Culture adequate volume   Culture NO GROWTH 2 DAYS    Report Status PENDING   Culture, blood (Routine x 2)     Status: None (Preliminary result)   Collection Time: 09/26/17  7:27 PM  Result Value Ref Range   Specimen Description BLOOD PORT DRAW    Special Requests      BOTTLES DRAWN AEROBIC AND ANAEROBIC Blood Culture adequate volume   Culture NO GROWTH 2 DAYS    Report Status PENDING   Urinalysis, Routine w reflex microscopic     Status: Abnormal   Collection Time: 09/26/17  7:27 PM  Result Value Ref Range   Color, Urine YELLOW YELLOW   APPearance HAZY (A) CLEAR   Specific Gravity, Urine 1.021 1.005  - 1.030   pH 5.0 5.0 - 8.0   Glucose, UA NEGATIVE NEGATIVE mg/dL   Hgb urine dipstick NEGATIVE NEGATIVE   Bilirubin Urine NEGATIVE NEGATIVE   Ketones, ur NEGATIVE NEGATIVE mg/dL   Protein, ur 30 (A) NEGATIVE mg/dL   Nitrite NEGATIVE NEGATIVE   Leukocytes, UA NEGATIVE NEGATIVE   RBC / HPF 0-5 0 - 5 RBC/hpf   WBC, UA 0-5 0 - 5 WBC/hpf   Bacteria, UA NONE SEEN NONE SEEN   Squamous Epithelial / LPF 0-5 (A) NONE SEEN   Hyaline Casts, UA PRESENT   I-Stat CG4 Lactic Acid, ED     Status: Abnormal   Collection Time: 09/26/17  7:29 PM  Result Value Ref Range   Lactic Acid, Venous 2.56 (HH) 0.5 - 1.9 mmol/L   Comment NOTIFIED PHYSICIAN   I-Stat CG4 Lactic Acid, ED     Status: None   Collection Time: 09/26/17  9:48 PM  Result Value Ref Range   Lactic Acid, Venous 1.69 0.5 - 1.9 mmol/L  CBC     Status: Abnormal   Collection Time: 09/27/17  7:48 AM  Result Value Ref Range   WBC 7.8 4.0 - 10.5 K/uL   RBC 3.80 (L) 3.87 - 5.11 MIL/uL   Hemoglobin 8.8 (L) 12.0 - 15.0 g/dL   HCT 66.7 (L) 17.7 - 95.6 %   MCV 78.2 78.0 - 100.0 fL   MCH 23.2 (L) 26.0 - 34.0 pg   MCHC 29.6 (L) 30.0 - 36.0 g/dL   RDW 46.2 (H) 90.0 - 94.4 %   Platelets 238 150 - 400 K/uL  Comprehensive metabolic panel     Status: Abnormal   Collection Time: 09/27/17  7:48 AM  Result Value Ref Range   Sodium 139 135 - 145 mmol/L   Potassium 3.8 3.5 - 5.1 mmol/L   Chloride 112 (H) 101 - 111 mmol/L   CO2 20 (L) 22 - 32 mmol/L   Glucose, Bld 101 (H) 65 - 99 mg/dL   BUN 48 (H) 6 - 20 mg/dL   Creatinine, Ser 6.15 (H) 0.44 - 1.00 mg/dL   Calcium 8.3 (L) 8.9 - 10.3 mg/dL   Total Protein 5.9 (L) 6.5 - 8.1 g/dL   Albumin 2.8 (L) 3.5 - 5.0 g/dL   AST  20 15 - 41 U/L   ALT 14 14 - 54 U/L   Alkaline Phosphatase 57 38 - 126 U/L   Total Bilirubin 0.5 0.3 - 1.2 mg/dL   GFR calc non Af Amer 19 (L) >60 mL/min   GFR calc Af Amer 22 (L) >60 mL/min    Comment: (NOTE) The eGFR has been calculated using the CKD EPI equation. This calculation  has not been validated in all clinical situations. eGFR's persistently <60 mL/min signify possible Chronic Kidney Disease.    Anion gap 7 5 - 15  Glucose, capillary     Status: None   Collection Time: 09/27/17  8:19 AM  Result Value Ref Range   Glucose-Capillary 85 65 - 99 mg/dL  Blood gas, arterial     Status: Abnormal   Collection Time: 09/27/17  8:35 AM  Result Value Ref Range   FIO2 0.21    pH, Arterial 7.295 (L) 7.350 - 7.450   pCO2 arterial 37.5 32.0 - 48.0 mmHg   pO2, Arterial 94.6 83.0 - 108.0 mmHg   Bicarbonate 18.1 (L) 20.0 - 28.0 mmol/L   Acid-base deficit 7.6 (H) 0.0 - 2.0 mmol/L   O2 Saturation 96.1 %   Patient temperature 37.0    Collection site RIGHT RADIAL    Drawn by [id number]    Sample type ARTERIAL DRAW    Allens test (pass/fail) PASS PASS  Ammonia     Status: None   Collection Time: 09/27/17 10:06 AM  Result Value Ref Range   Ammonia 19 9 - 35 umol/L  Glucose, capillary     Status: None   Collection Time: 09/27/17 11:25 AM  Result Value Ref Range   Glucose-Capillary 97 65 - 99 mg/dL  Glucose, capillary     Status: Abnormal   Collection Time: 09/27/17  3:45 PM  Result Value Ref Range   Glucose-Capillary 116 (H) 65 - 99 mg/dL  Glucose, capillary     Status: Abnormal   Collection Time: 09/27/17  9:02 PM  Result Value Ref Range   Glucose-Capillary 155 (H) 65 - 99 mg/dL  Glucose, capillary     Status: Abnormal   Collection Time: 09/28/17  7:50 AM  Result Value Ref Range   Glucose-Capillary 106 (H) 65 - 99 mg/dL    ABGS  Recent Labs  09/27/17 0835  PHART 7.295*  PO2ART 94.6  HCO3 18.1*   CULTURES Recent Results (from the past 240 hour(s))  MRSA PCR Screening     Status: Abnormal   Collection Time: 09/20/17 12:47 AM  Result Value Ref Range Status   MRSA by PCR POSITIVE (A) NEGATIVE Final    Comment: RESULT CALLED TO, READ BACK BY AND VERIFIED WITH: TETREAULT,H '@0336'$  BY MATTHEWS, B 10.17.18        The GeneXpert MRSA Assay (FDA approved for  NASAL specimens only), is one component of a comprehensive MRSA colonization surveillance program. It is not intended to diagnose MRSA infection nor to guide or monitor treatment for MRSA infections.   Culture, blood (Routine x 2)     Status: None (Preliminary result)   Collection Time: 09/26/17  7:27 PM  Result Value Ref Range Status   Specimen Description BLOOD PORT DRAW  Final   Special Requests   Final    BOTTLES DRAWN AEROBIC ONLY Blood Culture adequate volume   Culture NO GROWTH 2 DAYS  Final   Report Status PENDING  Incomplete  Culture, blood (Routine x 2)     Status: None (Preliminary result)  Collection Time: 09/26/17  7:27 PM  Result Value Ref Range Status   Specimen Description BLOOD PORT DRAW  Final   Special Requests   Final    BOTTLES DRAWN AEROBIC AND ANAEROBIC Blood Culture adequate volume   Culture NO GROWTH 2 DAYS  Final   Report Status PENDING  Incomplete   Studies/Results: Ct Head Wo Contrast  Result Date: 09/26/2017 CLINICAL DATA:  76 year old discharged from the hospital yesterday with a diagnosis of pneumonia, presenting today with worsening confusion and increasing weakness. EXAM: CT HEAD WITHOUT CONTRAST TECHNIQUE: Contiguous axial images were obtained from the base of the skull through the vertex without intravenous contrast. COMPARISON:  09/19/2017, 09/24/2016 and earlier. FINDINGS: Patient motion degraded images of the skull base. Brain: Mild cortical atrophy and moderate deep atrophy, unchanged dating back to 2014. Moderate to severe changes of small vessel disease of the white matter diffusely, unchanged. 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 No mass lesion. No midline shift. No acute hemorrhage or hematoma. No extra-axial fluid collections. No evidence of acute infarction. Vascular: Severe atherosclerosis involving the left vertebral artery. Severe bilateral carotid siphon atherosclerosis. No hyperdense vessel. Skull: No skull  fracture or other focal osseous abnormality involving the skull. Sinuses/Orbits: Visualized paranasal sinuses, bilateral mastoid air cells and bilateral middle ear cavities well-aerated. Visualized orbits and globes are normal. Other: None. IMPRESSION: 1. No acute intracranial abnormality. 2. Stable generalized atrophy and moderate to severe chronic microvascular ischemic changes of the white matter. Electronically Signed   By: Evangeline Dakin M.D.   On: 09/26/2017 20:49   Dg Chest Port 1 View  Result Date: 09/26/2017 CLINICAL DATA:  Weakness. The patient was discharged from the hospital yesterday after being treated for pneumonia. EXAM: PORTABLE CHEST 1 VIEW COMPARISON:  PA and lateral chest 08/29/2016 and 09/19/2017. FINDINGS: The patient is rotated on the exam. There is cardiomegaly without edema. No pneumothorax or pleural effusion. Lungs clear. Pacing device is in place. Aortic atherosclerosis is noted. IMPRESSION: Cardiomegaly without acute disease. Electronically Signed   By: Inge Rise M.D.   On: 09/26/2017 19:55    Medications:  Prior to Admission:  Prescriptions Prior to Admission  Medication Sig Dispense Refill Last Dose  . acetaminophen (TYLENOL) 325 MG tablet Take 325-650 mg by mouth every 6 (six) hours as needed for mild pain, moderate pain or headache.    Past Month at Unknown time  . apixaban (ELIQUIS) 5 MG TABS tablet Take 5 mg by mouth 2 (two) times daily.    09/26/2017 at 1700  . aspirin EC 81 MG tablet Take 81 mg by mouth daily.   09/26/2017 at Unknown time  . atorvastatin (LIPITOR) 40 MG tablet Take 40 mg by mouth at bedtime.   09/26/2017 at Unknown time  . benazepril (LOTENSIN) 40 MG tablet Take 40 mg by mouth daily.    09/26/2017 at Unknown time  . ferrous sulfate 325 (65 FE) MG tablet Take 325 mg by mouth daily with breakfast.   09/26/2017 at Unknown time  . furosemide (LASIX) 40 MG tablet Take 40 mg by mouth daily as needed for fluid.   09/26/2017 at Unknown time  .  gabapentin (NEURONTIN) 100 MG capsule Take 200 mg by mouth at bedtime.   09/26/2017 at Unknown time  . ibuprofen (ADVIL,MOTRIN) 200 MG tablet Take 600 mg by mouth every 6 (six) hours as needed for headache, mild pain or moderate pain.   Past Month at Unknown time  . levETIRAcetam (KEPPRA) 500  MG tablet Take 500 mg by mouth 2 (two) times daily.   09/26/2017 at 1700  . lubiprostone (AMITIZA) 24 MCG capsule Take 24 mcg by mouth 2 (two) times daily with a meal.   09/26/2017 at Unknown time  . Melatonin 3 MG TABS Take 3 mg by mouth at bedtime as needed (for sleep).   Past Month at Unknown time  . metFORMIN (GLUCOPHAGE) 500 MG tablet Take 500 mg by mouth 2 (two) times daily with a meal.    09/26/2017 at Unknown time  . metoprolol tartrate (LOPRESSOR) 25 MG tablet Take 12.5 mg by mouth 2 (two) times daily.    09/26/2017 at 1700  . nitroGLYCERIN (NITROSTAT) 0.4 MG SL tablet Place 1 tablet (0.4 mg total) under the tongue every 5 (five) minutes as needed for chest pain. 25 tablet 1 unknown  . omega-3 acid ethyl esters (LOVAZA) 1 G capsule Take 2 g by mouth 2 (two) times daily.    09/26/2017 at Unknown time  . ondansetron (ZOFRAN-ODT) 4 MG disintegrating tablet Take 4 mg by mouth every 8 (eight) hours as needed for nausea or vomiting.   Past Month at Unknown time  . pantoprazole (PROTONIX) 40 MG tablet Take 1 tablet (40 mg total) by mouth daily. 30 tablet 3 09/26/2017 at Unknown time  . potassium chloride (K-DUR,KLOR-CON) 10 MEQ tablet Take 10 mEq by mouth daily.   09/26/2017 at Unknown time  . senna-docusate (SENEXON-S) 8.6-50 MG tablet Take 1 tablet by mouth 2 (two) times daily.   09/26/2017 at Unknown time  . traZODone (DESYREL) 50 MG tablet Take 50 mg by mouth at bedtime.   09/26/2017 at Unknown time   Scheduled: . apixaban  5 mg Oral BID  . aspirin EC  81 mg Oral Daily  . atorvastatin  40 mg Oral QHS  . gabapentin  200 mg Oral QHS  . insulin aspart  0-9 Units Subcutaneous TID WC  . levETIRAcetam  500  mg Oral BID  . lubiprostone  24 mcg Oral BID WC  . metoprolol tartrate  12.5 mg Oral BID  . pantoprazole  40 mg Oral Daily  . senna-docusate  1 tablet Oral BID  . traZODone  50 mg Oral QHS   Continuous: . sodium chloride 100 mL/hr at 09/28/17 0616  . ceFEPime (MAXIPIME) IV Stopped (09/27/17 2008)  . vancomycin Stopped (09/27/17 2038)   MWN:[id number] **OR** acetaminophen, LORazepam, ondansetron **OR** ondansetron (ZOFRAN) IV  Assesment: She was admitted with acute kidney injury presumably from dehydration. She is receiving some IV fluids. Her regular blood pressure medication was held because of acute kidney injury and her kidney function is pending this morning. Her blood pressure has been up but she has just received metoprolol and it's better but this will need to be monitored. Active Problems:   Acute kidney injury ([place])   HCAP (healthcare-associated pneumonia)    Plan: Continue treatments. Request neurology consultation since she hasn't improved. Treat her blood pressure. Recheck labs. Recheck chest x-ray.    LOS: 1 day   Kenzlei Runions L 09/28/2017, 8:01 AM

## 2017-09-29 ENCOUNTER — Encounter (HOSPITAL_COMMUNITY): Payer: Self-pay | Admitting: *Deleted

## 2017-09-29 LAB — BASIC METABOLIC PANEL
ANION GAP: 8 (ref 5–15)
BUN: 17 mg/dL (ref 6–20)
CALCIUM: 9.1 mg/dL (ref 8.9–10.3)
CO2: 20 mmol/L — AB (ref 22–32)
CREATININE: 1.01 mg/dL — AB (ref 0.44–1.00)
Chloride: 112 mmol/L — ABNORMAL HIGH (ref 101–111)
GFR calc Af Amer: >60 mL/min (ref >60)
GFR, EST NON AFRICAN AMERICAN: 52 mL/min — AB (ref >60)
GLUCOSE: 99 mg/dL (ref 65–99)
POTASSIUM: 3.3 mmol/L — AB (ref 3.5–5.1)
Sodium: 140 mmol/L (ref 135–145)

## 2017-09-29 LAB — CBC WITH DIFFERENTIAL/PLATELET
BASOS ABS: 0.1 10*3/uL (ref 0.0–0.1)
Basophils Relative: 1 %
EOS PCT: 9 %
Eosinophils Absolute: 0.7 10*3/uL (ref 0.0–0.7)
HEMATOCRIT: 31.5 % — AB (ref 36.0–46.0)
Hemoglobin: 9.6 g/dL — ABNORMAL LOW (ref 12.0–15.0)
LYMPHS ABS: 1.8 10*3/uL (ref 0.7–4.0)
LYMPHS PCT: 23 %
MCH: 23.3 pg — AB (ref 26.0–34.0)
MCHC: 30.5 g/dL (ref 30.0–36.0)
MCV: 76.5 fL — AB (ref 78.0–100.0)
MONO ABS: 0.7 10*3/uL (ref 0.1–1.0)
Monocytes Relative: 9 %
NEUTROS ABS: 4.6 10*3/uL (ref 1.7–7.7)
Neutrophils Relative %: 59 %
PLATELETS: 216 10*3/uL (ref 150–400)
RBC: 4.12 MIL/uL (ref 3.87–5.11)
RDW: 19.6 % — AB (ref 11.5–15.5)
WBC: 7.9 10*3/uL (ref 4.0–10.5)

## 2017-09-29 LAB — GLUCOSE, CAPILLARY
Glucose-Capillary: 94 mg/dL (ref 65–99)
Glucose-Capillary: 97 mg/dL (ref 65–99)
Glucose-Capillary: 107 mg/dL — ABNORMAL HIGH (ref 65–99)
Glucose-Capillary: 114 mg/dL — ABNORMAL HIGH (ref 65–99)

## 2017-09-29 MED ORDER — BENAZEPRIL HCL 10 MG PO TABS
40.0000 mg | ORAL_TABLET | Freq: Every day | ORAL | Status: DC
  Administered 2017-09-29 – 2017-10-01 (×3): 40 mg via ORAL
  Filled 2017-09-29 (×3): qty 4

## 2017-09-29 MED ORDER — LORAZEPAM 2 MG/ML IJ SOLN
0.5000 mg | INTRAMUSCULAR | Status: AC | PRN
  Administered 2017-09-29 (×2): 0.5 mg via INTRAVENOUS
  Filled 2017-09-29 (×2): qty 1

## 2017-09-29 MED ORDER — LORAZEPAM 2 MG/ML IJ SOLN: 0.5000 mg | Freq: Three times a day (TID) | INTRAMUSCULAR | Status: DC | PRN

## 2017-09-29 MED ORDER — LORAZEPAM 2 MG/ML IJ SOLN: 1.0000 mg | Freq: Three times a day (TID) | INTRAMUSCULAR | Status: DC | PRN

## 2017-09-29 NOTE — NC FL2 (Signed)
[place] MEDICAID FL2 LEVEL OF CARE SCREENING TOOL     IDENTIFICATION  Patient Name: [name] Birthdate: 11-Oct-1941 Sex: female Admission Date (Current Location): 09/26/2017  [place] Number:  Aaron Edelman [id number] K Facility and Address:  [facility],  [address] [place] [address]      Provider Number: [id number]  Attending Physician Name and Address:  Kari Baars, MD  Relative Name and Phone Number:       Current Level of Care: Hospital Recommended Level of Care: Skilled Nursing Facility Prior Approval Number:    Date Approved/Denied:   PASRR Number: [id number]  Discharge Plan: SNF    Current Diagnoses: Patient Active Problem List   Diagnosis Date Noted  . HCAP (healthcare-associated pneumonia) 09/26/2017  . Lobar pneumonia (HCC) 09/19/2017  . Acute metabolic encephalopathy 09/19/2017  . Concussion with no loss of consciousness 09/28/2016  . AKI (acute kidney injury) (HCC) 09/25/2016  . Acute kidney injury (nontraumatic) (HCC) 09/25/2016  . Fall 09/25/2016  . Acute kidney injury (HCC) 09/25/2016  . Chest pain 08/29/2016  . Atrial fibrillation (HCC) 02/06/2015  . Encephalopathy, metabolic 10/12/2014  . Intractable nausea and vomiting 10/09/2014  . Epigastric abdominal pain 10/09/2014  . Expected blood loss anemia 12/04/2013  . S/P left THA, AA 12/03/2013  . Protein-calorie malnutrition, severe (HCC) 10/02/2013  . Precordial pain 09/30/2013  . Failure to thrive 07/26/2013  . Anemia of chronic disease 07/13/2013  . Altered mental state 07/13/2013  . NSTEMI (non-ST elevated myocardial infarction) (HCC) 07/01/2013  . Acute delirium 06/29/2013  . Fecal impaction (HCC) 06/29/2013  . Total knee replacement status 05/22/2013  . H/O total hip arthroplasty 05/22/2013  . Spinal stenosis, lumbar region, with neurogenic claudication 05/22/2013  . Osteoarthritis of left hip 05/22/2013  . Noninfectious gastroenteritis and colitis  03/05/2013  . Dehydration 03/05/2013  . Family history of colon cancer 02/15/2013  . PUD (peptic ulcer disease) 02/15/2013  . Diabetes mellitus, type II (HCC)   . Hypertension   . Arteriosclerotic cardiovascular disease (ASCVD)   . Gastroesophageal reflux disease   . Hyperlipidemia   . Deep vein thrombosis, upper right extremity (HCC)   . Obesity   . AV block   . COPD (chronic obstructive pulmonary disease) (HCC)   . Sleep apnea   . Chronic kidney disease (CKD), stage III (moderate) (HCC)   . Seizure disorder (HCC)   . Anemia, normocytic normochromic 12/15/2011  . Chronic diastolic heart failure (HCC) 11/18/2011  . PACEMAKER, PERMANENT 05/13/2009  . 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 GASTRIC ULCER 05/12/2009  . Gastroparesis 05/12/2009  . DIVERTICULAR DISEASE 05/12/2009    Orientation RESPIRATION BLADDER Height & Weight     Self, Place  Normal Continent Weight: 197 lb 1.5 oz (89.4 kg) Height:  5\' 2"  (157.5 cm)  BEHAVIORAL SYMPTOMS/MOOD NEUROLOGICAL BOWEL NUTRITION STATUS      Continent Diet (carb modified 60g carbs)  AMBULATORY STATUS COMMUNICATION OF NEEDS Skin   Extensive Assist Verbally Normal                       Personal Care Assistance Level of Assistance  Bathing, Feeding, Dressing Bathing Assistance: Limited assistance Feeding assistance: Independent Dressing Assistance: Limited assistance     Functional Limitations Info  Sight, Hearing, Speech Sight Info: Adequate Hearing Info: Adequate Speech Info: Adequate    SPECIAL CARE FACTORS FREQUENCY  Contractures Contractures Info: Not present    Additional Factors Info  Code Status, Allergies, Psychotropic, Isolation Precautions Code Status Info: DNR Allergies Info: Codeine, Food (leafy raw vegetables and seeds), Levaquin, Oxycodone, Peanut containing drug products, Reglan Psychotropic Info: Trazodone   Isolation Precautions Info: 10.17.18 mrsa by pcr     Current  Medications (09/29/2017):  This is the current hospital active medication list Current Facility-Administered Medications  Medication Dose Route Frequency Provider Last Rate Last Dose  . 0.9 %  sodium chloride infusion   Intravenous Continuous Meredeth IdeLama, Gagan S, MD 100 mL/hr at 09/29/17 0342    . acetaminophen (TYLENOL) tablet 650 mg  650 mg Oral Q6H PRN Meredeth IdeLama, Gagan S, MD   650 mg at 09/29/17 [id number]   Or  . acetaminophen (TYLENOL) suppository 650 mg  650 mg Rectal Q6H PRN Meredeth IdeLama, Gagan S, MD      . apixaban (ELIQUIS) tablet 5 mg  5 mg Oral BID Meredeth IdeLama, Gagan S, MD   5 mg at 09/29/17 0949  . aspirin EC tablet 81 mg  81 mg Oral Daily Meredeth IdeLama, Gagan S, MD   81 mg at 09/29/17 0949  . atorvastatin (LIPITOR) tablet 40 mg  40 mg Oral QHS Meredeth IdeLama, Gagan S, MD   40 mg at 09/28/17 2211  . benazepril (LOTENSIN) tablet 40 mg  40 mg Oral Daily Kari BaarsHawkins, Edward, MD   40 mg at 09/29/17 0949  . gabapentin (NEURONTIN) capsule 200 mg  200 mg Oral QHS Sharl MaLama, Gagan S, MD   200 mg at 09/28/17 2211  . hydrALAZINE (APRESOLINE) injection 10 mg  10 mg Intravenous Q4H PRN Kari BaarsHawkins, Edward, MD   10 mg at 09/29/17 0817  . insulin aspart (novoLOG) injection 0-9 Units  0-9 Units Subcutaneous TID WC Meredeth IdeLama, Gagan S, MD      . levETIRAcetam (KEPPRA) tablet 500 mg  500 mg Oral BID Meredeth IdeLama, Gagan S, MD   500 mg at 09/29/17 0948  . LORazepam (ATIVAN) injection 0.5 mg  0.5 mg Intravenous Q4H PRN Bobette Mortiz, David Manuel, MD      . lubiprostone (AMITIZA) capsule 24 mcg  24 mcg Oral BID WC Meredeth IdeLama, Gagan S, MD   24 mcg at 09/28/17 1637  . metoprolol tartrate (LOPRESSOR) tablet 12.5 mg  12.5 mg Oral BID Bobette Mortiz, David Manuel, MD   12.5 mg at 09/29/17 0948  . ondansetron (ZOFRAN) tablet 4 mg  4 mg Oral Q6H PRN Meredeth IdeLama, Gagan S, MD   4 mg at 09/27/17 2127   Or  . ondansetron (ZOFRAN) injection 4 mg  4 mg Intravenous Q6H PRN Meredeth IdeLama, Gagan S, MD      . pantoprazole (PROTONIX) EC tablet 40 mg  40 mg Oral Daily Meredeth IdeLama, Gagan S, MD   40 mg at 09/29/17 0948  . senna-docusate  (Senokot-S) tablet 1 tablet  1 tablet Oral BID Meredeth IdeLama, Gagan S, MD   1 tablet at 09/29/17 0950  . traZODone (DESYREL) tablet 50 mg  50 mg Oral QHS Meredeth IdeLama, Gagan S, MD   50 mg at 09/28/17 2224     Discharge Medications: Please see discharge summary for a list of discharge medications.  Relevant Imaging Results:  Relevant Lab Results:   Additional Information SSN: [id number]  Elliot GaultKathleen Jamelle Noy, LCSW

## 2017-09-29 NOTE — Evaluation (Signed)
Physical Therapy Evaluation Patient Details Name: [name] MRN: [id number] DOB: 1941/01/06 Today's Date: 09/29/2017   History of Present Illness  [name]  is a 76 y.o. female, history of hypertension,atrial fibrillation, hyperlipidemia CKD stage II who was discharged from the hospital yesterday after she was treated for pneumonia.  She was brought back to the hospital due to confusion and increasing weakness this since this morning.    Clinical Impression  Patient at high risk for falls at this time secondary to BLE weakness with poor balance, limited to a few side steps and c/o severe BLE pain with most movement or pressure.  Patient will benefit from continued physical therapy in hospital and recommended venue below to increase strength, balance, endurance for safe ADLs and gait.    Follow Up Recommendations SNF;Supervision/Assistance - 24 hour    Equipment Recommendations  None recommended by PT    Recommendations for Other Services       Precautions / Restrictions Precautions Precautions: Fall Restrictions Weight Bearing Restrictions: No      Mobility  Bed Mobility Overal bed mobility: Needs Assistance Bed Mobility: Supine to Sit;Sit to Supine     Supine to sit: Mod assist Sit to supine: Mod assist      Transfers Overall transfer level: Needs assistance Equipment used: Rolling walker (2 wheeled) Transfers: Sit to/from Stand Sit to Stand: Mod assist            Ambulation/Gait Ambulation/Gait assistance: Max assist Ambulation Distance (Feet): 2 Feet Assistive device: Rolling walker (2 wheeled) Gait Pattern/deviations: Decreased step length - right;Decreased step length - left;Decreased stance time - right;Decreased stance time - left;Decreased stride length   Gait velocity interpretation: Below normal speed for age/gender General Gait Details: Patient limited to a few shuffling side steps up to 3-4 steps at bedside due to BLE weakness, poor  balance  Stairs            Wheelchair Mobility    Modified Rankin (Stroke Patients Only)       Balance Overall balance assessment: Needs assistance Sitting-balance support: Bilateral upper extremity supported;Feet supported Sitting balance-Leahy Scale: Fair     Standing balance support: Bilateral upper extremity supported;During functional activity Standing balance-Leahy Scale: Poor                               Pertinent Vitals/Pain Pain Assessment: Faces Faces Pain Scale: Hurts whole lot Pain Location: BLE Pain Descriptors / Indicators: Pressure;Sharp;Constant Pain Intervention(s): Limited activity within patient's tolerance;Monitored during session;Patient requesting pain meds-RN notified    Home Living Family/patient expects to be discharged to:: Private residence Living Arrangements: Children Available Help at Discharge: Family Type of Home: House Home Access: Level entry     Home Layout: One level Home Equipment: Environmental consultant - 2 wheels;Bedside commode;Wheelchair - manual Additional Comments: patient had quad cane in her room, not sure if it belonged to her    Prior Function Level of Independence: Independent with assistive device(s)               Hand Dominance        Extremity/Trunk Assessment   Upper Extremity Assessment Upper Extremity Assessment: Generalized weakness    Lower Extremity Assessment Lower Extremity Assessment: Generalized weakness    Cervical / Trunk Assessment Cervical / Trunk Assessment: Kyphotic  Communication   Communication: No difficulties  Cognition Arousal/Alertness: Awake/alert Behavior During Therapy: WFL for tasks assessed/performed Overall Cognitive Status: Within Functional Limits  for tasks assessed                                        General Comments      Exercises     Assessment/Plan    PT Assessment Patient needs continued PT services  PT Problem List Decreased  strength;Decreased activity tolerance;Decreased balance;Decreased mobility       PT Treatment Interventions Gait training;Functional mobility training;Therapeutic activities;Therapeutic exercise;Patient/family education    PT Goals (Current goals can be found in the Care Plan section)  Acute Rehab PT Goals Patient Stated Goal: return home PT Goal Formulation: With patient Time For Goal Achievement: 10/06/17    Frequency Min 3X/week   Barriers to discharge        Co-evaluation               AM-PAC PT "6 Clicks" Daily Activity  Outcome Measure Difficulty turning over in bed (including adjusting bedclothes, sheets and blankets)?: Unable Difficulty moving from lying on back to sitting on the side of the bed? : Unable Difficulty sitting down on and standing up from a chair with arms (e.g., wheelchair, bedside commode, etc,.)?: Unable Help needed moving to and from a bed to chair (including a wheelchair)?: A Lot Help needed walking in hospital room?: A Lot Help needed climbing 3-5 steps with a railing? : Total 6 Click Score: 8    End of Session   Activity Tolerance: Patient limited by fatigue Patient left: in bed;with call bell/phone within reach;with bed alarm set Nurse Communication: Mobility status PT Visit Diagnosis: Unsteadiness on feet (R26.81);Other abnormalities of gait and mobility (R26.89);Muscle weakness (generalized) (M62.81)    Time: [id number] PT Time Calculation (min) (ACUTE ONLY): 29 min   Charges:   PT Evaluation $PT Eval Moderate Complexity: 1 Mod PT Treatments $Therapeutic Activity: 23-37 mins   PT G Codes:        11:49 AM, 09/29/17 Ocie BobJames Trashaun Streight, MPT Physical Therapist with [facility] [phone] office [phone] mobile phone

## 2017-09-29 NOTE — Care Management Important Message (Signed)
Important Message  Patient Details  Name: [name] MRN: [id number] Date of Birth: [birth year]   Medicare Important Message Given:  Yes    Jerremy Maione, Chrystine OilerSharley Diane, RN 09/29/2017, 1:48 PM

## 2017-09-29 NOTE — Clinical Social Work Placement (Signed)
   CLINICAL SOCIAL WORK PLACEMENT  NOTE  Date:  09/29/2017  Patient Details  Name: [name] MRN: [id number] Date of Birth: 10-29-1941  Clinical Social Work is seeking post-discharge placement for this patient at the Skilled  Nursing Facility level of care (*CSW will initial, date and re-position this form in  chart as items are completed):  Yes   Patient/family provided with [facility] Clinical Social Work Department's list of facilities offering this level of care within the geographic area requested by the patient (or if unable, by the patient's family).  Yes   Patient/family informed of their freedom to choose among providers that offer the needed level of care, that participate in Medicare, Medicaid or managed care program needed by the patient, have an available bed and are willing to accept the patient.  Yes   Patient/family informed of [facility]'s ownership interest [facility] and [facility], as well as of the fact that they are under no obligation to receive care at these facilities.  PASRR submitted to EDS on 09/29/17     PASRR number received on 09/29/17     Existing PASRR number confirmed on       FL2 transmitted to all facilities in geographic area requested by pt/family on 09/29/17     FL2 transmitted to all facilities within larger geographic area on       Patient informed that his/her managed care company has contracts with or will negotiate with certain facilities, including the following:        Yes   Patient/family informed of bed offers received.  [facility] [place] [facility] [place]     Physician recommends and patient chooses bed at      [facility] [place] on  .  Patient to be transferred to facility by       Patient family notified on   of transfer.  Name of family member notified:        PHYSICIAN Please sign FL2     Additional Comment:     _______________________________________________ Elliot GaultKathleen Liyla Radliff, LCSW 09/29/2017, 1:00 PM

## 2017-09-29 NOTE — Clinical Social Work Note (Signed)
Clinical Social Work Assessment  Patient Details  Name: [name] MRN: [id number] Date of Birth: 1941-06-07  Date of referral:  09/29/17               Reason for consult:  Facility Placement                Permission sought to share information with:    Permission granted to share information::     Name::        Agency::     Relationship::     Contact Information:     Housing/Transportation Living arrangements for the past 2 months:  Single Family Home Source of Information:  Adult Children Patient Interpreter Needed:  None Criminal Activity/Legal Involvement Pertinent to Current Situation/Hospitalization:    Significant Relationships:  Adult Children Lives with:  Adult Children Do you feel safe going back to the place where you live?  Yes Need for family participation in patient care:  Yes (Comment)  Care giving concerns: No care giving concerns identified   Social Worker assessment / plan:  Pt is a 76 year old female admitted with acute kidney injury per H&P. Pt is sleeping at the time of CSW attempted visit. Chart notes indicate pt has had some confusion here in the hospital and may have some baseline dementia that becomes worse when she has an infection and when she is out of her familiar environment. CSW spoke with pt's daughter, Dondra SpryGail, by phone to assess and assist with dc planning. Per dtr, pt resides with her in their single family home. Pt is alone during the day and is able to meet her basic needs. Pt's daughter does assist pt with bathing, medications, and transportation. Dtr reports no barriers to pt obtaining prescribed medication. Pt has both a cane and a walker at home for ambulation. MD is indicating that pt will likely need short term SNF rehab at dc. Pt's daughter is aware. She states that she/pt would like a [facility] to [facility] and also [facility]. CSW will initiate referrals and will follow for dc planning needs. No definite dc date known at this  time.  Employment status:  Retired Health and safety inspectornsurance information:  Medicare PT Recommendations:  Skilled Nursing Facility Information / Referral to community resources:  Skilled Nursing Facility  Patient/Family's Response to care:  Pt family agreeable to and appreciative of CSW involvement.  Patient/Family's Understanding of and Emotional Response to Diagnosis, Current Treatment, and Prognosis:  Pt s daughter appears to understand and is accepting of plan.  Emotional Assessment Appearance:  Appears stated age Attitude/Demeanor/Rapport:    Affect (typically observed):    Orientation:  Oriented to Self Alcohol / Substance use:    Psych involvement (Current and /or in the community):     Discharge Needs  Concerns to be addressed:  Discharge Planning Concerns Readmission within the last 30 days:  Yes Current discharge risk:    Barriers to Discharge:      Elliot GaultKathleen Braysen Cloward, LCSW 09/29/2017, 11:46 AM

## 2017-09-29 NOTE — Progress Notes (Signed)
Subjective: She is sleepy but arousable. She became more alert yesterday afternoon and I think some of her sleepiness in the morning yesterday was because she had received Ativan for agitation. She has no new complaints. Renal function has improved. Still no source of infection.  Objective: Vital signs in last 24 hours: Temp:  [97.6 F (36.4 C)-98.3 F (36.8 C)] 97.6 F (36.4 C) (10/26 0400) Pulse Rate:  [65-94] 67 (10/26 0310) Resp:  [19-27] 21 (10/26 0310) BP: (159-186)/(86-99) 175/90 (10/26 0300) SpO2:  [97 %-100 %] 99 % (10/26 0310) Weight:  [89.4 kg (197 lb 1.5 oz)] 89.4 kg (197 lb 1.5 oz) (10/26 0500) Weight change: 1.5 kg (3 lb 4.9 oz) Last BM Date: 09/27/17  Intake/Output from previous day: 10/25 0701 - 10/26 0700 In: 2545 [P.O.:295; I.V.:2000; IV Piggyback:250] Out: 3050 [Urine:3050]  PHYSICAL EXAM General appearance: Sleepy but arousable. Confused Resp: rhonchi bilaterally Cardio: irregularly irregular rhythm GI: soft, non-tender; bowel sounds normal; no masses,  no organomegaly Extremities: extremities normal, atraumatic, no cyanosis or edema Skin warm and dry  Lab Results:  Results for orders placed or performed during the hospital encounter of 09/26/17 (from the past 48 hour(s))  Glucose, capillary     Status: None   Collection Time: 09/27/17  8:19 AM  Result Value Ref Range   Glucose-Capillary 85 65 - 99 mg/dL  Blood gas, arterial     Status: Abnormal   Collection Time: 09/27/17  8:35 AM  Result Value Ref Range   FIO2 0.21    pH, Arterial 7.295 (L) 7.350 - 7.450   pCO2 arterial 37.5 32.0 - 48.0 mmHg   pO2, Arterial 94.6 83.0 - 108.0 mmHg   Bicarbonate 18.1 (L) 20.0 - 28.0 mmol/L   Acid-base deficit 7.6 (H) 0.0 - 2.0 mmol/L   O2 Saturation 96.1 %   Patient temperature 37.0    Collection site RIGHT RADIAL    Drawn by [id number]    Sample type ARTERIAL DRAW    Allens test (pass/fail) PASS PASS  Ammonia     Status: None   Collection Time: 09/27/17 10:06 AM   Result Value Ref Range   Ammonia 19 9 - 35 umol/L  Glucose, capillary     Status: None   Collection Time: 09/27/17 11:25 AM  Result Value Ref Range   Glucose-Capillary 97 65 - 99 mg/dL  Glucose, capillary     Status: Abnormal   Collection Time: 09/27/17  3:45 PM  Result Value Ref Range   Glucose-Capillary 116 (H) 65 - 99 mg/dL  Glucose, capillary     Status: Abnormal   Collection Time: 09/27/17  9:02 PM  Result Value Ref Range   Glucose-Capillary 155 (H) 65 - 99 mg/dL  Glucose, capillary     Status: Abnormal   Collection Time: 09/28/17  7:50 AM  Result Value Ref Range   Glucose-Capillary 106 (H) 65 - 99 mg/dL  CBC with Differential/Platelet     Status: Abnormal   Collection Time: 09/28/17  9:16 AM  Result Value Ref Range   WBC 5.9 4.0 - 10.5 K/uL   RBC 3.92 3.87 - 5.11 MIL/uL   Hemoglobin 9.0 (L) 12.0 - 15.0 g/dL   HCT 30.5 (L) 36.0 - 46.0 %   MCV 77.8 (L) 78.0 - 100.0 fL   MCH 23.0 (L) 26.0 - 34.0 pg   MCHC 29.5 (L) 30.0 - 36.0 g/dL   RDW 19.8 (H) 11.5 - 15.5 %   Platelets 215 150 - 400 K/uL   Neutrophils  Relative % 60 %   Neutro Abs 3.6 1.7 - 7.7 K/uL   Lymphocytes Relative 21 %   Lymphs Abs 1.2 0.7 - 4.0 K/uL   Monocytes Relative 7 %   Monocytes Absolute 0.4 0.1 - 1.0 K/uL   Eosinophils Relative 11 %   Eosinophils Absolute 0.6 0.0 - 0.7 K/uL   Basophils Relative 1 %   Basophils Absolute 0.1 0.0 - 0.1 K/uL  Basic metabolic panel     Status: Abnormal   Collection Time: 09/28/17  9:16 AM  Result Value Ref Range   Sodium 142 135 - 145 mmol/L   Potassium 4.0 3.5 - 5.1 mmol/L   Chloride 115 (H) 101 - 111 mmol/L   CO2 21 (L) 22 - 32 mmol/L   Glucose, Bld 107 (H) 65 - 99 mg/dL   BUN 28 (H) 6 - 20 mg/dL   Creatinine, Ser 1.32 (H) 0.44 - 1.00 mg/dL    Comment: DELTA CHECK NOTED   Calcium 9.0 8.9 - 10.3 mg/dL   GFR calc non Af Amer 38 (L) >60 mL/min   GFR calc Af Amer 44 (L) >60 mL/min    Comment: (NOTE) The eGFR has been calculated using the CKD EPI equation. This  calculation has not been validated in all clinical situations. eGFR's persistently <60 mL/min signify possible Chronic Kidney Disease.    Anion gap 6 5 - 15  Glucose, capillary     Status: Abnormal   Collection Time: 09/28/17 11:34 AM  Result Value Ref Range   Glucose-Capillary 100 (H) 65 - 99 mg/dL  Glucose, capillary     Status: None   Collection Time: 09/28/17  4:18 PM  Result Value Ref Range   Glucose-Capillary 99 65 - 99 mg/dL  Glucose, capillary     Status: None   Collection Time: 09/28/17 10:38 PM  Result Value Ref Range   Glucose-Capillary 98 65 - 99 mg/dL  CBC with Differential/Platelet     Status: Abnormal   Collection Time: 09/29/17  4:10 AM  Result Value Ref Range   WBC 7.9 4.0 - 10.5 K/uL   RBC 4.12 3.87 - 5.11 MIL/uL   Hemoglobin 9.6 (L) 12.0 - 15.0 g/dL   HCT 31.5 (L) 36.0 - 46.0 %   MCV 76.5 (L) 78.0 - 100.0 fL   MCH 23.3 (L) 26.0 - 34.0 pg   MCHC 30.5 30.0 - 36.0 g/dL   RDW 19.6 (H) 11.5 - 15.5 %   Platelets 216 150 - 400 K/uL    Comment: SPECIMEN CHECKED FOR CLOTS CONSISTENT WITH PREVIOUS RESULT PLATELET COUNT CONFIRMED BY SMEAR    Neutrophils Relative % 59 %   Neutro Abs 4.6 1.7 - 7.7 K/uL   Lymphocytes Relative 23 %   Lymphs Abs 1.8 0.7 - 4.0 K/uL   Monocytes Relative 9 %   Monocytes Absolute 0.7 0.1 - 1.0 K/uL   Eosinophils Relative 9 %   Eosinophils Absolute 0.7 0.0 - 0.7 K/uL   Basophils Relative 1 %   Basophils Absolute 0.1 0.0 - 0.1 K/uL  Basic metabolic panel     Status: Abnormal   Collection Time: 09/29/17  4:10 AM  Result Value Ref Range   Sodium 140 135 - 145 mmol/L   Potassium 3.3 (L) 3.5 - 5.1 mmol/L    Comment: DELTA CHECK NOTED   Chloride 112 (H) 101 - 111 mmol/L   CO2 20 (L) 22 - 32 mmol/L   Glucose, Bld 99 65 - 99 mg/dL   BUN 17  6 - 20 mg/dL   Creatinine, Ser 1.01 (H) 0.44 - 1.00 mg/dL   Calcium 9.1 8.9 - 10.3 mg/dL   GFR calc non Af Amer 52 (L) >60 mL/min   GFR calc Af Amer >60 >60 mL/min    Comment: (NOTE) The eGFR has  been calculated using the CKD EPI equation. This calculation has not been validated in all clinical situations. eGFR's persistently <60 mL/min signify possible Chronic Kidney Disease.    Anion gap 8 5 - 15  Glucose, capillary     Status: None   Collection Time: 09/29/17  7:35 AM  Result Value Ref Range   Glucose-Capillary 94 65 - 99 mg/dL    ABGS  Recent Labs  09/27/17 0835  PHART 7.295*  PO2ART 94.6  HCO3 18.1*   CULTURES Recent Results (from the past 240 hour(s))  MRSA PCR Screening     Status: Abnormal   Collection Time: 09/20/17 12:47 AM  Result Value Ref Range Status   MRSA by PCR POSITIVE (A) NEGATIVE Final    Comment: RESULT CALLED TO, READ BACK BY AND VERIFIED WITH: TETREAULT,H '@0336'$  BY MATTHEWS, B 10.17.18        The GeneXpert MRSA Assay (FDA approved for NASAL specimens only), is one component of a comprehensive MRSA colonization surveillance program. It is not intended to diagnose MRSA infection nor to guide or monitor treatment for MRSA infections.   Culture, blood (Routine x 2)     Status: None (Preliminary result)   Collection Time: 09/26/17  7:27 PM  Result Value Ref Range Status   Specimen Description BLOOD PORT DRAW  Final   Special Requests   Final    BOTTLES DRAWN AEROBIC ONLY Blood Culture adequate volume   Culture NO GROWTH 2 DAYS  Final   Report Status PENDING  Incomplete  Culture, blood (Routine x 2)     Status: None (Preliminary result)   Collection Time: 09/26/17  7:27 PM  Result Value Ref Range Status   Specimen Description BLOOD PORT DRAW  Final   Special Requests   Final    BOTTLES DRAWN AEROBIC AND ANAEROBIC Blood Culture adequate volume   Culture NO GROWTH 2 DAYS  Final   Report Status PENDING  Incomplete   Studies/Results: Dg Chest Port 1 View  Result Date: 09/28/2017 CLINICAL DATA:  Respiratory difficulty EXAM: PORTABLE CHEST 1 VIEW COMPARISON:  09/26/2017 FINDINGS: Cardiac shadow is enlarged. Postsurgical changes as well as  a pacing device are again seen and stable. Left-sided chest wall port is again noted and stable. Lungs are well aerated bilaterally without focal infiltrate or sizable effusion. No acute bony abnormality is seen. IMPRESSION: No active disease. Electronically Signed   By: Inez Catalina M.D.   On: 09/28/2017 08:31    Medications:  Prior to Admission:  Prescriptions Prior to Admission  Medication Sig Dispense Refill Last Dose  . acetaminophen (TYLENOL) 325 MG tablet Take 325-650 mg by mouth every 6 (six) hours as needed for mild pain, moderate pain or headache.    Past Month at Unknown time  . apixaban (ELIQUIS) 5 MG TABS tablet Take 5 mg by mouth 2 (two) times daily.    09/26/2017 at 1700  . aspirin EC 81 MG tablet Take 81 mg by mouth daily.   09/26/2017 at Unknown time  . atorvastatin (LIPITOR) 40 MG tablet Take 40 mg by mouth at bedtime.   09/26/2017 at Unknown time  . benazepril (LOTENSIN) 40 MG tablet Take 40 mg by mouth daily.  09/26/2017 at Unknown time  . ferrous sulfate 325 (65 FE) MG tablet Take 325 mg by mouth daily with breakfast.   09/26/2017 at Unknown time  . furosemide (LASIX) 40 MG tablet Take 40 mg by mouth daily as needed for fluid.   09/26/2017 at Unknown time  . gabapentin (NEURONTIN) 100 MG capsule Take 200 mg by mouth at bedtime.   09/26/2017 at Unknown time  . ibuprofen (ADVIL,MOTRIN) 200 MG tablet Take 600 mg by mouth every 6 (six) hours as needed for headache, mild pain or moderate pain.   Past Month at Unknown time  . levETIRAcetam (KEPPRA) 500 MG tablet Take 500 mg by mouth 2 (two) times daily.   09/26/2017 at 1700  . lubiprostone (AMITIZA) 24 MCG capsule Take 24 mcg by mouth 2 (two) times daily with a meal.   09/26/2017 at Unknown time  . Melatonin 3 MG TABS Take 3 mg by mouth at bedtime as needed (for sleep).   Past Month at Unknown time  . metFORMIN (GLUCOPHAGE) 500 MG tablet Take 500 mg by mouth 2 (two) times daily with a meal.    09/26/2017 at Unknown time  .  metoprolol tartrate (LOPRESSOR) 25 MG tablet Take 12.5 mg by mouth 2 (two) times daily.    09/26/2017 at 1700  . nitroGLYCERIN (NITROSTAT) 0.4 MG SL tablet Place 1 tablet (0.4 mg total) under the tongue every 5 (five) minutes as needed for chest pain. 25 tablet 1 unknown  . omega-3 acid ethyl esters (LOVAZA) 1 G capsule Take 2 g by mouth 2 (two) times daily.    09/26/2017 at Unknown time  . ondansetron (ZOFRAN-ODT) 4 MG disintegrating tablet Take 4 mg by mouth every 8 (eight) hours as needed for nausea or vomiting.   Past Month at Unknown time  . pantoprazole (PROTONIX) 40 MG tablet Take 1 tablet (40 mg total) by mouth daily. 30 tablet 3 09/26/2017 at Unknown time  . potassium chloride (K-DUR,KLOR-CON) 10 MEQ tablet Take 10 mEq by mouth daily.   09/26/2017 at Unknown time  . senna-docusate (SENEXON-S) 8.6-50 MG tablet Take 1 tablet by mouth 2 (two) times daily.   09/26/2017 at Unknown time  . traZODone (DESYREL) 50 MG tablet Take 50 mg by mouth at bedtime.   09/26/2017 at Unknown time   Scheduled: . apixaban  5 mg Oral BID  . aspirin EC  81 mg Oral Daily  . atorvastatin  40 mg Oral QHS  . gabapentin  200 mg Oral QHS  . insulin aspart  0-9 Units Subcutaneous TID WC  . levETIRAcetam  500 mg Oral BID  . lubiprostone  24 mcg Oral BID WC  . metoprolol tartrate  12.5 mg Oral BID  . pantoprazole  40 mg Oral Daily  . senna-docusate  1 tablet Oral BID  . traZODone  50 mg Oral QHS   Continuous: . sodium chloride 100 mL/hr at 09/29/17 0342  . ceFEPime (MAXIPIME) IV Stopped (09/28/17 2015)  . vancomycin Stopped (09/28/17 2126)   OAC:[id number] **OR** acetaminophen, hydrALAZINE, LORazepam, ondansetron **OR** ondansetron (ZOFRAN) IV  Assesment: She was admitted with acute kidney injury. She has altered mental status which may be from the acute kidney injury but I think she also has some element of dementia at baseline. She normally does well when she's in her regular environment but it seems to  make a difference when she gets infections or gets into the hospital. She's been treated as healthcare associated pneumonia but I do not see that on  x-ray. Urinalysis was really not very impressive for any kind of infection.  Her acute kidney injury has improved  She has chronic atrial fib and is chronically anticoagulated  She has chronic back pain and is on gabapentin for that she does not tolerate pain medications  She has hypertension and her blood pressure has been up partially because she's off losartan which I think we can restart since her kidney function is better  She is very weak and I think she may need rehabilitation.  Discussed with family at bedside. The patient has expressed that she does not want life support and she does have a living will at home. Active Problems:   Acute kidney injury ([place])   HCAP (healthcare-associated pneumonia)    Plan: Transfer to floor. Discontinue antibiotics. PT consultation. DO NOT RESUSCITATE status    LOS: 2 days   Erian Lariviere L 09/29/2017, 7:56 AM

## 2017-09-29 NOTE — Clinical Social Work Placement (Signed)
   CLINICAL SOCIAL WORK PLACEMENT  NOTE  Date:  09/29/2017  Patient Details  Name: [name] MRN: [id number] Date of Birth: 1941-04-11  Clinical Social Work is seeking post-discharge placement for this patient at the Skilled  Nursing Facility level of care (*CSW will initial, date and re-position this form in  chart as items are completed):  Yes   Patient/family provided with [facility] Clinical Social Work Department's list of facilities offering this level of care within the geographic area requested by the patient (or if unable, by the patient's family).  Yes   Patient/family informed of their freedom to choose among providers that offer the needed level of care, that participate in Medicare, Medicaid or managed care program needed by the patient, have an available bed and are willing to accept the patient.  Yes   Patient/family informed of [facility]'s ownership interest in [facility] and [facility], as well as of the fact that they are under no obligation to receive care at these facilities.  PASRR submitted to EDS on 09/29/17     PASRR number received on 09/29/17     Existing PASRR number confirmed on       FL2 transmitted to all facilities in geographic area requested by pt/family on 09/29/17     FL2 transmitted to all facilities within larger geographic area on       Patient informed that his/her managed care company has contracts with or will negotiate with certain facilities, including the following:            Patient/family informed of bed offers received.  Patient chooses bed at       Physician recommends and patient chooses bed at      Patient to be transferred to   on  .  Patient to be transferred to facility by       Patient family notified on   of transfer.  Name of family member notified:        PHYSICIAN       Additional Comment:    _______________________________________________ Elliot GaultKathleen Efosa Treichler, LCSW 09/29/2017, 12:43 PM

## 2017-09-30 LAB — CBC WITH DIFFERENTIAL/PLATELET
BASOS ABS: 0.1 10*3/uL (ref 0.0–0.1)
BASOS PCT: 1 %
EOS ABS: 0.5 10*3/uL (ref 0.0–0.7)
Eosinophils Relative: 7 %
HEMATOCRIT: 30.4 % — AB (ref 36.0–46.0)
Hemoglobin: 9.1 g/dL — ABNORMAL LOW (ref 12.0–15.0)
Lymphocytes Relative: 22 %
Lymphs Abs: 1.5 10*3/uL (ref 0.7–4.0)
MCH: 22.8 pg — ABNORMAL LOW (ref 26.0–34.0)
MCHC: 29.9 g/dL — ABNORMAL LOW (ref 30.0–36.0)
MCV: 76.2 fL — ABNORMAL LOW (ref 78.0–100.0)
Monocytes Absolute: 0.6 10*3/uL (ref 0.1–1.0)
Monocytes Relative: 9 %
NEUTROS ABS: 4.4 10*3/uL (ref 1.7–7.7)
NEUTROS PCT: 61 %
PLATELETS: 232 10*3/uL (ref 150–400)
RBC: 3.99 MIL/uL (ref 3.87–5.11)
RDW: 19.7 % — AB (ref 11.5–15.5)
WBC: 7.1 10*3/uL (ref 4.0–10.5)

## 2017-09-30 LAB — BASIC METABOLIC PANEL
ANION GAP: 8 (ref 5–15)
BUN: 13 mg/dL (ref 6–20)
CALCIUM: 9.1 mg/dL (ref 8.9–10.3)
CHLORIDE: 117 mmol/L — AB (ref 101–111)
CO2: 20 mmol/L — AB (ref 22–32)
CREATININE: 1.05 mg/dL — AB (ref 0.44–1.00)
GFR calc non Af Amer: 50 mL/min — ABNORMAL LOW (ref >60)
GFR, EST AFRICAN AMERICAN: 57 mL/min — AB (ref >60)
GLUCOSE: 103 mg/dL — AB (ref 65–99)
Potassium: 3.4 mmol/L — ABNORMAL LOW (ref 3.5–5.1)
SODIUM: 145 mmol/L (ref 135–145)

## 2017-09-30 LAB — GLUCOSE, CAPILLARY
GLUCOSE-CAPILLARY: 103 mg/dL — AB (ref 65–99)
Glucose-Capillary: 101 mg/dL — ABNORMAL HIGH (ref 65–99)
Glucose-Capillary: 108 mg/dL — ABNORMAL HIGH (ref 65–99)

## 2017-09-30 NOTE — Plan of Care (Signed)
Problem: Pain Managment: Goal: General experience of comfort will improve Outcome: Progressing Pt turned & repositioned for increased comfort; routine medication given for pain relief--monitoring accordingly.

## 2017-09-30 NOTE — Plan of Care (Signed)
Problem: Pain Managment: Goal: General experience of comfort will improve Outcome: Progressing Assess and monitor for pain; perform administration of pain med; re-assessment for pain effectiveness.

## 2017-09-30 NOTE — Plan of Care (Signed)
Problem: Pain Managment: Goal: General experience of comfort will improve Outcome: Progressing Assess and monitor pain status

## 2017-09-30 NOTE — Plan of Care (Signed)
Problem: Education: Goal: Knowledge of [facility] Education information/materials will improve Outcome: Progressing Discussed and reviewed plan of care with patient/family

## 2017-09-30 NOTE — Progress Notes (Signed)
Subjective: She is still confused. She is more alert. No other new changes.  Objective: Vital signs in last 24 hours: Temp:  [98.1 F (36.7 C)-98.6 F (37 C)] 98.6 F (37 C) (10/27 0501) Pulse Rate:  [61-86] 72 (10/27 0501) Resp:  [16-19] 18 (10/26 2144) BP: (134-176)/(74-126) 176/91 (10/27 0501) SpO2:  [97 %-99 %] 98 % (10/26 1200) Weight change:  Last BM Date: 09/27/17  Intake/Output from previous day: 10/26 0701 - 10/27 0700 In: 2900 [I.V.:2900] Out: 750 [Urine:750]  PHYSICAL EXAM General appearance: alert and no distress Resp: clear to auscultation bilaterally Cardio: irregularly irregular rhythm GI: soft, non-tender; bowel sounds normal; no masses,  no organomegaly Extremities: extremities normal, atraumatic, no cyanosis or edema Skin warm and dry  Lab Results:  Results for orders placed or performed during the hospital encounter of 09/26/17 (from the past 48 hour(s))  Glucose, capillary     Status: Abnormal   Collection Time: 09/28/17 11:34 AM  Result Value Ref Range   Glucose-Capillary 100 (H) 65 - 99 mg/dL  Glucose, capillary     Status: None   Collection Time: 09/28/17  4:18 PM  Result Value Ref Range   Glucose-Capillary 99 65 - 99 mg/dL  Glucose, capillary     Status: None   Collection Time: 09/28/17 10:38 PM  Result Value Ref Range   Glucose-Capillary 98 65 - 99 mg/dL  CBC with Differential/Platelet     Status: Abnormal   Collection Time: 09/29/17  4:10 AM  Result Value Ref Range   WBC 7.9 4.0 - 10.5 K/uL   RBC 4.12 3.87 - 5.11 MIL/uL   Hemoglobin 9.6 (L) 12.0 - 15.0 g/dL   HCT 31.5 (L) 36.0 - 46.0 %   MCV 76.5 (L) 78.0 - 100.0 fL   MCH 23.3 (L) 26.0 - 34.0 pg   MCHC 30.5 30.0 - 36.0 g/dL   RDW 19.6 (H) 11.5 - 15.5 %   Platelets 216 150 - 400 K/uL    Comment: SPECIMEN CHECKED FOR CLOTS CONSISTENT WITH PREVIOUS RESULT PLATELET COUNT CONFIRMED BY SMEAR    Neutrophils Relative % 59 %   Neutro Abs 4.6 1.7 - 7.7 K/uL   Lymphocytes Relative 23 %   Lymphs Abs 1.8 0.7 - 4.0 K/uL   Monocytes Relative 9 %   Monocytes Absolute 0.7 0.1 - 1.0 K/uL   Eosinophils Relative 9 %   Eosinophils Absolute 0.7 0.0 - 0.7 K/uL   Basophils Relative 1 %   Basophils Absolute 0.1 0.0 - 0.1 K/uL  Basic metabolic panel     Status: Abnormal   Collection Time: 09/29/17  4:10 AM  Result Value Ref Range   Sodium 140 135 - 145 mmol/L   Potassium 3.3 (L) 3.5 - 5.1 mmol/L    Comment: DELTA CHECK NOTED   Chloride 112 (H) 101 - 111 mmol/L   CO2 20 (L) 22 - 32 mmol/L   Glucose, Bld 99 65 - 99 mg/dL   BUN 17 6 - 20 mg/dL   Creatinine, Ser 1.01 (H) 0.44 - 1.00 mg/dL   Calcium 9.1 8.9 - 10.3 mg/dL   GFR calc non Af Amer 52 (L) >60 mL/min   GFR calc Af Amer >60 >60 mL/min    Comment: (NOTE) The eGFR has been calculated using the CKD EPI equation. This calculation has not been validated in all clinical situations. eGFR's persistently <60 mL/min signify possible Chronic Kidney Disease.    Anion gap 8 5 - 15  Glucose, capillary  Status: None   Collection Time: 09/29/17  7:35 AM  Result Value Ref Range   Glucose-Capillary 94 65 - 99 mg/dL  Glucose, capillary     Status: None   Collection Time: 09/29/17 11:39 AM  Result Value Ref Range   Glucose-Capillary 97 65 - 99 mg/dL  Glucose, capillary     Status: Abnormal   Collection Time: 09/29/17  4:39 PM  Result Value Ref Range   Glucose-Capillary 107 (H) 65 - 99 mg/dL  Glucose, capillary     Status: Abnormal   Collection Time: 09/29/17  9:04 PM  Result Value Ref Range   Glucose-Capillary 114 (H) 65 - 99 mg/dL   Comment 1 Notify RN    Comment 2 Document in Chart   CBC with Differential/Platelet     Status: Abnormal   Collection Time: 09/30/17  8:18 AM  Result Value Ref Range   WBC 7.1 4.0 - 10.5 K/uL   RBC 3.99 3.87 - 5.11 MIL/uL   Hemoglobin 9.1 (L) 12.0 - 15.0 g/dL   HCT 30.4 (L) 36.0 - 46.0 %   MCV 76.2 (L) 78.0 - 100.0 fL   MCH 22.8 (L) 26.0 - 34.0 pg   MCHC 29.9 (L) 30.0 - 36.0 g/dL   RDW 19.7  (H) 11.5 - 15.5 %   Platelets 232 150 - 400 K/uL   Neutrophils Relative % 61 %   Neutro Abs 4.4 1.7 - 7.7 K/uL   Lymphocytes Relative 22 %   Lymphs Abs 1.5 0.7 - 4.0 K/uL   Monocytes Relative 9 %   Monocytes Absolute 0.6 0.1 - 1.0 K/uL   Eosinophils Relative 7 %   Eosinophils Absolute 0.5 0.0 - 0.7 K/uL   Basophils Relative 1 %   Basophils Absolute 0.1 0.0 - 0.1 K/uL  Basic metabolic panel     Status: Abnormal   Collection Time: 09/30/17  8:18 AM  Result Value Ref Range   Sodium 145 135 - 145 mmol/L   Potassium 3.4 (L) 3.5 - 5.1 mmol/L   Chloride 117 (H) 101 - 111 mmol/L   CO2 20 (L) 22 - 32 mmol/L   Glucose, Bld 103 (H) 65 - 99 mg/dL   BUN 13 6 - 20 mg/dL   Creatinine, Ser 1.05 (H) 0.44 - 1.00 mg/dL   Calcium 9.1 8.9 - 10.3 mg/dL   GFR calc non Af Amer 50 (L) >60 mL/min   GFR calc Af Amer 57 (L) >60 mL/min    Comment: (NOTE) The eGFR has been calculated using the CKD EPI equation. This calculation has not been validated in all clinical situations. eGFR's persistently <60 mL/min signify possible Chronic Kidney Disease.    Anion gap 8 5 - 15    ABGS No results for input(s): PHART, PO2ART, TCO2, HCO3 in the last 72 hours.  Invalid input(s): PCO2 CULTURES Recent Results (from the past 240 hour(s))  Culture, blood (Routine x 2)     Status: None (Preliminary result)   Collection Time: 09/26/17  7:27 PM  Result Value Ref Range Status   Specimen Description BLOOD PORT DRAW  Final   Special Requests   Final    BOTTLES DRAWN AEROBIC ONLY Blood Culture adequate volume   Culture NO GROWTH 4 DAYS  Final   Report Status PENDING  Incomplete  Culture, blood (Routine x 2)     Status: None (Preliminary result)   Collection Time: 09/26/17  7:27 PM  Result Value Ref Range Status   Specimen Description BLOOD PORT DRAW  Final   Special Requests   Final    BOTTLES DRAWN AEROBIC AND ANAEROBIC Blood Culture adequate volume   Culture NO GROWTH 4 DAYS  Final   Report Status PENDING   Incomplete   Studies/Results: No results found.  Medications:  Prior to Admission:  Prescriptions Prior to Admission  Medication Sig Dispense Refill Last Dose  . acetaminophen (TYLENOL) 325 MG tablet Take 325-650 mg by mouth every 6 (six) hours as needed for mild pain, moderate pain or headache.    Past Month at Unknown time  . apixaban (ELIQUIS) 5 MG TABS tablet Take 5 mg by mouth 2 (two) times daily.    09/26/2017 at 1700  . aspirin EC 81 MG tablet Take 81 mg by mouth daily.   09/26/2017 at Unknown time  . atorvastatin (LIPITOR) 40 MG tablet Take 40 mg by mouth at bedtime.   09/26/2017 at Unknown time  . benazepril (LOTENSIN) 40 MG tablet Take 40 mg by mouth daily.    09/26/2017 at Unknown time  . ferrous sulfate 325 (65 FE) MG tablet Take 325 mg by mouth daily with breakfast.   09/26/2017 at Unknown time  . furosemide (LASIX) 40 MG tablet Take 40 mg by mouth daily as needed for fluid.   09/26/2017 at Unknown time  . gabapentin (NEURONTIN) 100 MG capsule Take 200 mg by mouth at bedtime.   09/26/2017 at Unknown time  . ibuprofen (ADVIL,MOTRIN) 200 MG tablet Take 600 mg by mouth every 6 (six) hours as needed for headache, mild pain or moderate pain.   Past Month at Unknown time  . levETIRAcetam (KEPPRA) 500 MG tablet Take 500 mg by mouth 2 (two) times daily.   09/26/2017 at 1700  . lubiprostone (AMITIZA) 24 MCG capsule Take 24 mcg by mouth 2 (two) times daily with a meal.   09/26/2017 at Unknown time  . Melatonin 3 MG TABS Take 3 mg by mouth at bedtime as needed (for sleep).   Past Month at Unknown time  . metFORMIN (GLUCOPHAGE) 500 MG tablet Take 500 mg by mouth 2 (two) times daily with a meal.    09/26/2017 at Unknown time  . metoprolol tartrate (LOPRESSOR) 25 MG tablet Take 12.5 mg by mouth 2 (two) times daily.    09/26/2017 at 1700  . nitroGLYCERIN (NITROSTAT) 0.4 MG SL tablet Place 1 tablet (0.4 mg total) under the tongue every 5 (five) minutes as needed for chest pain. 25 tablet 1  unknown  . omega-3 acid ethyl esters (LOVAZA) 1 G capsule Take 2 g by mouth 2 (two) times daily.    09/26/2017 at Unknown time  . ondansetron (ZOFRAN-ODT) 4 MG disintegrating tablet Take 4 mg by mouth every 8 (eight) hours as needed for nausea or vomiting.   Past Month at Unknown time  . pantoprazole (PROTONIX) 40 MG tablet Take 1 tablet (40 mg total) by mouth daily. 30 tablet 3 09/26/2017 at Unknown time  . potassium chloride (K-DUR,KLOR-CON) 10 MEQ tablet Take 10 mEq by mouth daily.   09/26/2017 at Unknown time  . senna-docusate (SENEXON-S) 8.6-50 MG tablet Take 1 tablet by mouth 2 (two) times daily.   09/26/2017 at Unknown time  . traZODone (DESYREL) 50 MG tablet Take 50 mg by mouth at bedtime.   09/26/2017 at Unknown time   Scheduled: . apixaban  5 mg Oral BID  . aspirin EC  81 mg Oral Daily  . atorvastatin  40 mg Oral QHS  . benazepril  40 mg Oral Daily  .  gabapentin  200 mg Oral QHS  . insulin aspart  0-9 Units Subcutaneous TID WC  . levETIRAcetam  500 mg Oral BID  . lubiprostone  24 mcg Oral BID WC  . metoprolol tartrate  12.5 mg Oral BID  . pantoprazole  40 mg Oral Daily  . senna-docusate  1 tablet Oral BID  . traZODone  50 mg Oral QHS   Continuous: . sodium chloride 100 mL/hr (09/29/17 2322)   JOA:[id number] **OR** acetaminophen, hydrALAZINE, LORazepam, ondansetron **OR** ondansetron (ZOFRAN) IV  Assesment: She was admitted with acute kidney injury. She has altered mental status. She has had healthcare associated pneumonia but that does not show on her current x-ray which I have personally reviewed. Her mental status is improving but she is still confused. It has been recommended that she go to skilled care facility for rehabilitation and I think that's a appropriate plan for her. At baseline she has coronary artery disease but she's not having any chest pain, seizure disorder but there is no evidence that she had a seizure although that may have caused altered mental status,  hypertension which is doing better acute kidney injury which is better chronic pain and she is totally intolerant of pain medications. I think she has some element of dementia at baseline which is typically not causing her overt symptoms but when she gets sick with something else she becomes very confused Active Problems:   Acute kidney injury ([place])   HCAP (healthcare-associated pneumonia)    Plan: Continue current treatments    LOS: 3 days   Umberto Pavek L 09/30/2017, 9:29 AM

## 2017-10-01 DIAGNOSIS — J449 Chronic obstructive pulmonary disease, unspecified: Secondary | ICD-10-CM | POA: Diagnosis not present

## 2017-10-01 DIAGNOSIS — R488 Other symbolic dysfunctions: Secondary | ICD-10-CM | POA: Diagnosis not present

## 2017-10-01 DIAGNOSIS — N179 Acute kidney failure, unspecified: Secondary | ICD-10-CM | POA: Diagnosis not present

## 2017-10-01 DIAGNOSIS — R4182 Altered mental status, unspecified: Secondary | ICD-10-CM | POA: Diagnosis not present

## 2017-10-01 DIAGNOSIS — I10 Essential (primary) hypertension: Secondary | ICD-10-CM | POA: Diagnosis not present

## 2017-10-01 DIAGNOSIS — R300 Dysuria: Secondary | ICD-10-CM | POA: Diagnosis not present

## 2017-10-01 DIAGNOSIS — M6281 Muscle weakness (generalized): Secondary | ICD-10-CM | POA: Diagnosis not present

## 2017-10-01 DIAGNOSIS — I25118 Atherosclerotic heart disease of native coronary artery with other forms of angina pectoris: Secondary | ICD-10-CM | POA: Diagnosis not present

## 2017-10-01 DIAGNOSIS — R279 Unspecified lack of coordination: Secondary | ICD-10-CM | POA: Diagnosis not present

## 2017-10-01 DIAGNOSIS — E1140 Type 2 diabetes mellitus with diabetic neuropathy, unspecified: Secondary | ICD-10-CM | POA: Diagnosis not present

## 2017-10-01 DIAGNOSIS — D649 Anemia, unspecified: Secondary | ICD-10-CM | POA: Diagnosis not present

## 2017-10-01 DIAGNOSIS — E785 Hyperlipidemia, unspecified: Secondary | ICD-10-CM | POA: Diagnosis not present

## 2017-10-01 DIAGNOSIS — E119 Type 2 diabetes mellitus without complications: Secondary | ICD-10-CM | POA: Diagnosis not present

## 2017-10-01 DIAGNOSIS — I5020 Unspecified systolic (congestive) heart failure: Secondary | ICD-10-CM | POA: Diagnosis not present

## 2017-10-01 DIAGNOSIS — I4891 Unspecified atrial fibrillation: Secondary | ICD-10-CM | POA: Diagnosis not present

## 2017-10-01 DIAGNOSIS — R41841 Cognitive communication deficit: Secondary | ICD-10-CM | POA: Diagnosis not present

## 2017-10-01 DIAGNOSIS — E118 Type 2 diabetes mellitus with unspecified complications: Secondary | ICD-10-CM | POA: Diagnosis not present

## 2017-10-01 DIAGNOSIS — Z950 Presence of cardiac pacemaker: Secondary | ICD-10-CM | POA: Diagnosis not present

## 2017-10-01 DIAGNOSIS — Z7401 Bed confinement status: Secondary | ICD-10-CM | POA: Diagnosis not present

## 2017-10-01 DIAGNOSIS — I2510 Atherosclerotic heart disease of native coronary artery without angina pectoris: Secondary | ICD-10-CM | POA: Diagnosis not present

## 2017-10-01 DIAGNOSIS — I5032 Chronic diastolic (congestive) heart failure: Secondary | ICD-10-CM | POA: Diagnosis not present

## 2017-10-01 DIAGNOSIS — J189 Pneumonia, unspecified organism: Secondary | ICD-10-CM | POA: Diagnosis not present

## 2017-10-01 DIAGNOSIS — K219 Gastro-esophageal reflux disease without esophagitis: Secondary | ICD-10-CM | POA: Diagnosis not present

## 2017-10-01 LAB — GLUCOSE, CAPILLARY
GLUCOSE-CAPILLARY: 111 mg/dL — AB (ref 65–99)
Glucose-Capillary: 103 mg/dL — ABNORMAL HIGH (ref 65–99)

## 2017-10-01 LAB — CULTURE, BLOOD (ROUTINE X 2)
CULTURE: NO GROWTH
CULTURE: NO GROWTH
SPECIAL REQUESTS: ADEQUATE
Special Requests: ADEQUATE

## 2017-10-01 MED ORDER — LORAZEPAM 0.5 MG PO TABS: 0.5000 mg | ORAL_TABLET | Freq: Three times a day (TID) | ORAL | 0 refills | Status: AC | PRN

## 2017-10-01 MED ORDER — FUROSEMIDE 10 MG/ML IJ SOLN
40.0000 mg | Freq: Once | INTRAMUSCULAR | Status: AC
  Administered 2017-10-01: 40 mg via INTRAVENOUS
  Filled 2017-10-01: qty 4

## 2017-10-01 MED ORDER — METOPROLOL TARTRATE 25 MG PO TABS
25.0000 mg | ORAL_TABLET | Freq: Two times a day (BID) | ORAL | Status: DC
  Administered 2017-10-01: 25 mg via ORAL
  Filled 2017-10-01: qty 1

## 2017-10-01 MED ORDER — HEPARIN SOD (PORK) LOCK FLUSH 100 UNIT/ML IV SOLN
500.0000 [IU] | INTRAVENOUS | Status: AC | PRN
  Administered 2017-10-01: 500 [IU]
  Filled 2017-10-01: qty 5

## 2017-10-01 NOTE — Progress Notes (Signed)
Subjective: She is less confused this morning although she was agitated earlier.   Her is less confused this morning although she was agitated earlier.  her blood pressure was up I think because she has been agitated.  Objective: Vital signs in last 24 hours: Temp:  [98.2 F (36.8 C)-98.7 F (37.1 C)] 98.6 F (37 C) (10/28 0458) Pulse Rate:  [70-90] 70 (10/28 0458) Resp:  [16-18] 18 (10/28 0458) BP: (175-180)/(91-118) 180/91 (10/28 0458) SpO2:  [98 %-100 %] 100 % (10/28 0458) Weight change:  Last BM Date: 09/30/17  Intake/Output from previous day: 10/27 0701 - 10/28 0700 In: 2903 [P.O.:2503; I.V.:400] Out: 1300 [Urine:1300]  PHYSICAL EXAM General appearance: alert, cooperative, no distress and Still mildly confused but not agitated now Resp: clear to auscultation bilaterally Cardio: irregularly irregular rhythm GI: soft, non-tender; bowel sounds normal; no masses,  no organomegaly Extremities: extremities normal, atraumatic, no cyanosis or edema Skin warm and dry  Lab Results:  Results for orders placed or performed during the hospital encounter of 09/26/17 (from the past 48 hour(s))  Glucose, capillary     Status: None   Collection Time: 09/29/17 11:39 AM  Result Value Ref Range   Glucose-Capillary 97 65 - 99 mg/dL  Glucose, capillary     Status: Abnormal   Collection Time: 09/29/17  4:39 PM  Result Value Ref Range   Glucose-Capillary 107 (H) 65 - 99 mg/dL  Glucose, capillary     Status: Abnormal   Collection Time: 09/29/17  9:04 PM  Result Value Ref Range   Glucose-Capillary 114 (H) 65 - 99 mg/dL   Comment 1 Notify RN    Comment 2 Document in Chart   CBC with Differential/Platelet     Status: Abnormal   Collection Time: 09/30/17  8:18 AM  Result Value Ref Range   WBC 7.1 4.0 - 10.5 K/uL   RBC 3.99 3.87 - 5.11 MIL/uL   Hemoglobin 9.1 (L) 12.0 - 15.0 g/dL   HCT 81.2 (L) 72.7 - 54.9 %   MCV 76.2 (L) 78.0 - 100.0 fL   MCH 22.8 (L) 26.0 - 34.0 pg   MCHC 29.9 (L)  30.0 - 36.0 g/dL   RDW 63.2 (H) 11.2 - 69.9 %   Platelets 232 150 - 400 K/uL   Neutrophils Relative % 61 %   Neutro Abs 4.4 1.7 - 7.7 K/uL   Lymphocytes Relative 22 %   Lymphs Abs 1.5 0.7 - 4.0 K/uL   Monocytes Relative 9 %   Monocytes Absolute 0.6 0.1 - 1.0 K/uL   Eosinophils Relative 7 %   Eosinophils Absolute 0.5 0.0 - 0.7 K/uL   Basophils Relative 1 %   Basophils Absolute 0.1 0.0 - 0.1 K/uL  Basic metabolic panel     Status: Abnormal   Collection Time: 09/30/17  8:18 AM  Result Value Ref Range   Sodium 145 135 - 145 mmol/L   Potassium 3.4 (L) 3.5 - 5.1 mmol/L   Chloride 117 (H) 101 - 111 mmol/L   CO2 20 (L) 22 - 32 mmol/L   Glucose, Bld 103 (H) 65 - 99 mg/dL   BUN 13 6 - 20 mg/dL   Creatinine, Ser 8.83 (H) 0.44 - 1.00 mg/dL   Calcium 9.1 8.9 - 71.1 mg/dL   GFR calc non Af Amer 50 (L) >60 mL/min   GFR calc Af Amer 57 (L) >60 mL/min    Comment: (NOTE) The eGFR has been calculated using the CKD EPI equation. This calculation has not  been validated in all clinical situations. eGFR's persistently <60 mL/min signify possible Chronic Kidney Disease.    Anion gap 8 5 - 15  Glucose, capillary     Status: Abnormal   Collection Time: 09/30/17  1:16 PM  Result Value Ref Range   Glucose-Capillary 101 (H) 65 - 99 mg/dL  Glucose, capillary     Status: Abnormal   Collection Time: 09/30/17  4:18 PM  Result Value Ref Range   Glucose-Capillary 103 (H) 65 - 99 mg/dL  Glucose, capillary     Status: Abnormal   Collection Time: 09/30/17  8:24 PM  Result Value Ref Range   Glucose-Capillary 108 (H) 65 - 99 mg/dL   Comment 1 Notify RN    Comment 2 Document in Chart   Glucose, capillary     Status: Abnormal   Collection Time: 10/01/17  7:35 AM  Result Value Ref Range   Glucose-Capillary 111 (H) 65 - 99 mg/dL    ABGS No results for input(s): PHART, PO2ART, TCO2, HCO3 in the last 72 hours.  Invalid input(s): PCO2 CULTURES Recent Results (from the past 240 hour(s))  Culture, blood  (Routine x 2)     Status: None   Collection Time: 09/26/17  7:27 PM  Result Value Ref Range Status   Specimen Description BLOOD PORT DRAW  Final   Special Requests   Final    BOTTLES DRAWN AEROBIC ONLY Blood Culture adequate volume   Culture NO GROWTH 5 DAYS  Final   Report Status 10/01/2017 FINAL  Final  Culture, blood (Routine x 2)     Status: None   Collection Time: 09/26/17  7:27 PM  Result Value Ref Range Status   Specimen Description BLOOD PORT DRAW  Final   Special Requests   Final    BOTTLES DRAWN AEROBIC AND ANAEROBIC Blood Culture adequate volume   Culture NO GROWTH 5 DAYS  Final   Report Status 10/01/2017 FINAL  Final   Studies/Results: No results found.  Medications:  Prior to Admission:  Prescriptions Prior to Admission  Medication Sig Dispense Refill Last Dose  . acetaminophen (TYLENOL) 325 MG tablet Take 325-650 mg by mouth every 6 (six) hours as needed for mild pain, moderate pain or headache.    Past Month at Unknown time  . apixaban (ELIQUIS) 5 MG TABS tablet Take 5 mg by mouth 2 (two) times daily.    09/26/2017 at 1700  . aspirin EC 81 MG tablet Take 81 mg by mouth daily.   09/26/2017 at Unknown time  . atorvastatin (LIPITOR) 40 MG tablet Take 40 mg by mouth at bedtime.   09/26/2017 at Unknown time  . benazepril (LOTENSIN) 40 MG tablet Take 40 mg by mouth daily.    09/26/2017 at Unknown time  . ferrous sulfate 325 (65 FE) MG tablet Take 325 mg by mouth daily with breakfast.   09/26/2017 at Unknown time  . furosemide (LASIX) 40 MG tablet Take 40 mg by mouth daily as needed for fluid.   09/26/2017 at Unknown time  . gabapentin (NEURONTIN) 100 MG capsule Take 200 mg by mouth at bedtime.   09/26/2017 at Unknown time  . ibuprofen (ADVIL,MOTRIN) 200 MG tablet Take 600 mg by mouth every 6 (six) hours as needed for headache, mild pain or moderate pain.   Past Month at Unknown time  . levETIRAcetam (KEPPRA) 500 MG tablet Take 500 mg by mouth 2 (two) times daily.    09/26/2017 at 1700  . lubiprostone (AMITIZA) 24 MCG capsule  Take 24 mcg by mouth 2 (two) times daily with a meal.   09/26/2017 at Unknown time  . Melatonin 3 MG TABS Take 3 mg by mouth at bedtime as needed (for sleep).   Past Month at Unknown time  . metFORMIN (GLUCOPHAGE) 500 MG tablet Take 500 mg by mouth 2 (two) times daily with a meal.    09/26/2017 at Unknown time  . metoprolol tartrate (LOPRESSOR) 25 MG tablet Take 12.5 mg by mouth 2 (two) times daily.    09/26/2017 at 1700  . nitroGLYCERIN (NITROSTAT) 0.4 MG SL tablet Place 1 tablet (0.4 mg total) under the tongue every 5 (five) minutes as needed for chest pain. 25 tablet 1 unknown  . omega-3 acid ethyl esters (LOVAZA) 1 G capsule Take 2 g by mouth 2 (two) times daily.    09/26/2017 at Unknown time  . ondansetron (ZOFRAN-ODT) 4 MG disintegrating tablet Take 4 mg by mouth every 8 (eight) hours as needed for nausea or vomiting.   Past Month at Unknown time  . pantoprazole (PROTONIX) 40 MG tablet Take 1 tablet (40 mg total) by mouth daily. 30 tablet 3 09/26/2017 at Unknown time  . potassium chloride (K-DUR,KLOR-CON) 10 MEQ tablet Take 10 mEq by mouth daily.   09/26/2017 at Unknown time  . senna-docusate (SENEXON-S) 8.6-50 MG tablet Take 1 tablet by mouth 2 (two) times daily.   09/26/2017 at Unknown time  . traZODone (DESYREL) 50 MG tablet Take 50 mg by mouth at bedtime.   09/26/2017 at Unknown time   Scheduled: . apixaban  5 mg Oral BID  . aspirin EC  81 mg Oral Daily  . atorvastatin  40 mg Oral QHS  . benazepril  40 mg Oral Daily  . furosemide  40 mg Intravenous Once  . gabapentin  200 mg Oral QHS  . insulin aspart  0-9 Units Subcutaneous TID WC  . levETIRAcetam  500 mg Oral BID  . lubiprostone  24 mcg Oral BID WC  . metoprolol tartrate  25 mg Oral BID  . pantoprazole  40 mg Oral Daily  . senna-docusate  1 tablet Oral BID  . traZODone  50 mg Oral QHS   Continuous: . sodium chloride 100 mL/hr (09/29/17 2322)   ZYY:[id number]  **OR** acetaminophen, hydrALAZINE, LORazepam, ondansetron **OR** ondansetron (ZOFRAN) IV  Assesment: she was admitted with metabolic encephalopathy and acute kidney injury. She is better.  I think she is at maximum hospital benefit and ready for transfer to skilled care facility assuming that her blood pressure is. improved  Active Problems:   PACEMAKER, PERMANENT   Chronic diastolic heart failure ([place])   Diabetes mellitus, type II ([place])   Hypertension   Arteriosclerotic cardiovascular disease (ASCVD)   Gastroesophageal reflux disease   Obesity   COPD (chronic obstructive pulmonary disease) ([place])   Sleep apnea   Chronic kidney disease (CKD), stage III (moderate) (H[facility])   Seizure disorder (H[facility])   Dehydration   Anemia of chronic disease   Encephalopathy, metabolic   Atrial fibrillation ([place])   Acute kidney injury ([place])   HCAP (healthcare-associated pneumonia)    Plan: Probable transfer to skilled care facility    LOS: 4 days   Ashlei Chinchilla L 10/01/2017, 9:34 AM

## 2017-10-01 NOTE — Progress Notes (Signed)
D/Cd needle from left chest port. Heparin locked. Called [facility] to bring [name] [place], [place]. [facility] [place] [facility].

## 2017-10-01 NOTE — Progress Notes (Signed)
Clinical Social Worker facilitated patient discharge including contacting patient family and facility to confirm patient discharge plans.  Clinical information faxed to facility and family agreeable with plan.  RN Carollee HerterShannon stated she will arranged ambulance transport via [facility] in [place] .  RN to call [phone] (pt will go in rm# [id number])  report prior to discharge.  Clinical Social Worker will sign off for now as social work intervention is no longer needed. Please consult us again if new need arises.  Marrianne MoodAshley Valbona Slabach, MSW, [employer]CSWA [phone]

## 2017-10-01 NOTE — Discharge Summary (Signed)
Physician Discharge Summary  Patient ID: [name] MRN: [id number] DOB/AGE: 1941/05/14 76 y.o. Primary Care Physician:Celene Pippins, Ramon DredgeEdward, MD Admit date: 09/26/2017 Discharge date: 10/01/2017    Discharge Diagnoses:   Active Problems:   PACEMAKER, PERMANENT   Chronic diastolic heart failure (HCC)   Diabetes mellitus, type II (HCC)   Hypertension   Arteriosclerotic cardiovascular disease (ASCVD)   Gastroesophageal reflux disease   Obesity   COPD (chronic obstructive pulmonary disease) (HCC)   Sleep apnea   Chronic kidney disease (CKD), stage III (moderate) (HCC)   Seizure disorder (HCC)   Dehydration   Anemia of chronic disease   Encephalopathy, metabolic   Atrial fibrillation (HCC)   Acute kidney injury (HCC)   HCAP (healthcare-associated pneumonia)   Allergies as of 10/01/2017      Reactions   Codeine Other (See Comments)   Reaction:  Headaches    Food Nausea And Vomiting, Other (See Comments)   Pt is allergic to leafy raw vegetables and seeds.     Levaquin [levofloxacin] Other (See Comments)   Confusion, altered mental status, inability to walk   Oxycodone Hcl Other (See Comments)   Reaction:  Headaches    Peanut-containing Drug Products Nausea And Vomiting   Reglan [metoclopramide] Other (See Comments)   Reaction:  Confusion       Medication List    TAKE these medications   acetaminophen 325 MG tablet Commonly known as:  TYLENOL Take 325-650 mg by mouth every 6 (six) hours as needed for mild pain, moderate pain or headache.   apixaban 5 MG Tabs tablet Commonly known as:  ELIQUIS Take 5 mg by mouth 2 (two) times daily.   aspirin EC 81 MG tablet Take 81 mg by mouth daily.   atorvastatin 40 MG tablet Commonly known as:  LIPITOR Take 40 mg by mouth at bedtime.   benazepril 40 MG tablet Commonly known as:  LOTENSIN Take 40 mg by mouth daily.   ferrous sulfate 325 (65 FE) MG tablet Take 325 mg by mouth daily with breakfast.   furosemide 40 MG  tablet Commonly known as:  LASIX Take 40 mg by mouth daily as needed for fluid.   gabapentin 100 MG capsule Commonly known as:  NEURONTIN Take 200 mg by mouth at bedtime.   ibuprofen 200 MG tablet Commonly known as:  ADVIL,MOTRIN Take 600 mg by mouth every 6 (six) hours as needed for headache, mild pain or moderate pain.   levETIRAcetam 500 MG tablet Commonly known as:  KEPPRA Take 500 mg by mouth 2 (two) times daily.   LORazepam 0.5 MG tablet Commonly known as:  ATIVAN Take 1 tablet (0.5 mg total) by mouth every 8 (eight) hours as needed for anxiety.   lubiprostone 24 MCG capsule Commonly known as:  AMITIZA Take 24 mcg by mouth 2 (two) times daily with a meal.   Melatonin 3 MG Tabs Take 3 mg by mouth at bedtime as needed (for sleep).   metFORMIN 500 MG tablet Commonly known as:  GLUCOPHAGE Take 500 mg by mouth 2 (two) times daily with a meal.   metoprolol tartrate 25 MG tablet Commonly known as:  LOPRESSOR Take 12.5 mg by mouth 2 (two) times daily.   nitroGLYCERIN 0.4 MG SL tablet Commonly known as:  NITROSTAT Place 1 tablet (0.4 mg total) under the tongue every 5 (five) minutes as needed for chest pain.   omega-3 acid ethyl esters 1 g capsule Commonly known as:  LOVAZA Take 2 g by mouth 2 (  two) times daily.   ondansetron 4 MG disintegrating tablet Commonly known as:  ZOFRAN-ODT Take 4 mg by mouth every 8 (eight) hours as needed for nausea or vomiting.   pantoprazole 40 MG tablet Commonly known as:  PROTONIX Take 1 tablet (40 mg total) by mouth daily.   potassium chloride 10 MEQ tablet Commonly known as:  K-DUR,KLOR-CON Take 10 mEq by mouth daily.   SENEXON-S 8.6-50 MG tablet Generic drug:  senna-docusate Take 1 tablet by mouth 2 (two) times daily.   traZODone 50 MG tablet Commonly known as:  DESYREL Take 50 mg by mouth at bedtime.       Discharged Condition:Improved    ConsultsN Noneficant Diagnostic Studies: Dg Chest 2 View  Result Date:  09/19/2017 CLINICAL DATA:  Increasing level of confusion over the last 3 days, cough EXAM: CHEST  2 VIEW COMPARISON:  Chest x-ray of 08/29/2016 FINDINGS: There are slightly prominent markings at the right lung base suspicious for developing pneumonia. The left lung appears clear. No pleural effusion is seen. Moderate cardiomegaly is stable. 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 IMPRESSION: Slightly prominent markings at the right lung base suspicious for developing pneumonia. Recommend followup. Electronically Signed   By: Dwyane Dee M.D.   On: 09/19/2017 16:26   Dg Lumbar Spine Complete  Result Date: 09/01/2017 CLINICAL DATA:  Left hip pain . EXAM: LUMBAR SPINE - COMPLETE 4+ VIEW COMPARISON:  Lumbar spine 07/31/2015 . FINDINGS: Lumbar spine numbered as per prior study of 08/26/ 2016. L3-L4 and L4-L5 posterior and interbody fusion. Hardware intact. Lumbar spine scoliosis concave right. Bilateral hip degenerative change. Surgical clips right upper quadrant . IMPRESSION: L3-L4 and L4-L5 posterior and interbody fusion. Hardware intact. No acute bony abnormality. Electronically Signed   By: Maisie Fus  Register   On: 09/01/2017 13:40   Ct Head Wo Contrast  Result Date: 09/26/2017 CLINICAL DATA:  76 year old discharged from the hospital yesterday with a diagnosis of pneumonia, presenting today with worsening confusion and increasing weakness. EXAM: CT HEAD WITHOUT CONTRAST TECHNIQUE: Contiguous axial images were obtained from the base of the skull through the vertex without intravenous contrast. COMPARISON:  09/19/2017, 09/24/2016 and earlier. FINDINGS: Patient motion degraded images of the skull base. Brain: Mild cortical atrophy and moderate deep atrophy, unchanged dating back to 2014. Moderate to severe changes of small vessel disease of the white matter diffusely, unchanged. 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 No mass lesion. No midline shift. No acute  hemorrhage or hematoma. No extra-axial fluid collections. No evidence of acute infarction. Vascular: Severe atherosclerosis involving the left vertebral artery. Severe bilateral carotid siphon atherosclerosis. No hyperdense vessel. Skull: No skull fracture or other focal osseous abnormality involving the skull. Sinuses/Orbits: Visualized paranasal sinuses, bilateral mastoid air cells and bilateral middle ear cavities well-aerated. Visualized orbits and globes are normal. Other: None. IMPRESSION: 1. No acute intracranial abnormality. 2. Stable generalized atrophy and moderate to severe chronic microvascular ischemic changes of the white matter. Electronically Signed   By: Hulan Saas M.D.   On: 09/26/2017 20:49   Ct Head Wo Contrast  Result Date: 09/19/2017 CLINICAL DATA:  76 year old hypertensive female with confusion. No known injury. Initial encounter. EXAM: CT HEAD WITHOUT CONTRAST TECHNIQUE: Contiguous axial images were obtained from the base of the skull through the vertex without intravenous contrast. COMPARISON:  09/24/2016 head CT. FINDINGS: Brain: No intracranial hemorrhage or CT evidence of large acute infarct. Remote left thalamic infarct. Moderate chronic microvascular changes. Global atrophy without  hydrocephalus. No intracranial mass lesion noted on this unenhanced exam. Vascular: Prominent vascular calcifications. Skull: No acute abnormality. Sinuses/Orbits: Exophthalmos. Visualized paranasal sinuses are clear. Other: Mastoid air cells and middle ear cavities are clear. IMPRESSION: No intracranial hemorrhage or CT evidence of large acute infarct. Remote left thalamic infarct. Moderate chronic microvascular changes. Global atrophy. Electronically Signed   By: Lacy Duverney M.D.   On: 09/19/2017 16:15   Dg Chest Port 1 View  Result Date: 09/28/2017 CLINICAL DATA:  Respiratory difficulty EXAM: PORTABLE CHEST 1 VIEW COMPARISON:  09/26/2017 FINDINGS: Cardiac shadow is enlarged. Postsurgical  changes as well as a pacing device are again seen and stable. Left-sided chest wall port is again noted and stable. Lungs are well aerated bilaterally without focal infiltrate or sizable effusion. No acute bony abnormality is seen. IMPRESSION: No active disease. Electronically Signed   By: Alcide Clever M.D.   On: 09/28/2017 08:31   Dg Chest Port 1 View  Result Date: 09/26/2017 CLINICAL DATA:  Weakness. The patient was discharged from the hospital yesterday after being treated for pneumonia. EXAM: PORTABLE CHEST 1 VIEW COMPARISON:  PA and lateral chest 08/29/2016 and 09/19/2017. FINDINGS: The patient is rotated on the exam. There is cardiomegaly without edema. No pneumothorax or pleural effusion. Lungs clear. Pacing device is in place. Aortic atherosclerosis is noted. IMPRESSION: Cardiomegaly without acute disease. Electronically Signed   By: Drusilla Kanner M.D.   On: 09/26/2017 19:55   Dg Hip Unilat W Or Wo Pelvis 2-3 Views Left  Result Date: 09/01/2017 CLINICAL DATA:  Left hip pain.  Low back pain.  Multiple falls . EXAM: DG HIP (WITH OR WITHOUT PELVIS) 2-3V LEFT COMPARISON:  11/15/2016 . FINDINGS: Bilateral hip replacements. Hardware intact. Anatomic alignment. Postsurgical changes left femur. Mild lucency in the distal left femoral shaft noted. This may be related to prior surgery. Left ovary series suggest for further evaluation Prior lumbar spine fusion . No acute bony abnormality identified. Pelvic calcifications consistent phleboliths. Surgical clips in the pelvis . IMPRESSION: 1. Bilateral hip replacements. Hardware intact. Anatomic alignment. Postsurgical changes distal left femur. No acute abnormality . 2. Postsurgical changes distal left femur. Mild lucency in the femoral shaft noted. This may be related to prior surgery. Left femur series suggested for further evaluation . 3.  Prior lumbar fusion. 4. Peripheral vascular disease . Electronically Signed   By: Maisie Fus  Register   On: 09/01/2017  13:44   Dg Femur Min 2 Views Left  Result Date: 09/01/2017 CLINICAL DATA:  76 y/o female with left hip and leg pain after multiple falls in the past week. Prior left lower extremity surgery. EXAM: LEFT FEMUR 2 VIEWS COMPARISON:  Left hip series and left knee series 12 11/2016. FINDINGS: Left total hip arthroplasty hardware appears stable and intact. Left hemipelvis and proximal left femur appears stable and intact. Left total knee arthroplasty with distal femur intramedullary rod and cannulated screws. The hardware appears stable since 2017 and intact. Osteopenia of the visible tibia and fibula. Stable patella. No acute osseous abnormality identified. Calcified femoral artery and peripheral vascular atherosclerosis. IMPRESSION: 1. Stable postoperative appearance of left total hip and left knee arthroplasty. 2.  No acute osseous abnormality identified. Electronically Signed   By: Odessa Fleming M.D.   On: 09/01/2017 14:31    Lab Results: Basic Metabolic Panel:  Recent Labs  16/10/96 0410 09/30/17 0818  NA 140 145  K 3.3* 3.4*  CL 112* 117*  CO2 20* 20*  GLUCOSE 99 103*  BUN  17 13  CREATININE 1.01* 1.05*  CALCIUM 9.1 9.1   Liver Function Tests: No results for input(s): AST, ALT, ALKPHOS, BILITOT, PROT, ALBUMIN in the last 72 hours.   CBC:  Recent Labs  09/29/17 0410 09/30/17 0818  WBC 7.9 7.1  NEUTROABS 4.6 4.4  HGB 9.6* 9.1*  HCT 31.5* 30.4*  MCV 76.5* 76.2*  PLT 216 232    Recent Results (from the past 240 hour(s))  Culture, blood (Routine x 2)     Status: None   Collection Time: 09/26/17  7:27 PM  Result Value Ref Range Status   Specimen Description BLOOD PORT DRAW  Final   Special Requests   Final    BOTTLES DRAWN AEROBIC ONLY Blood Culture adequate volume   Culture NO GROWTH 5 DAYS  Final   Report Status 10/01/2017 FINAL  Final  Culture, blood (Routine x 2)     Status: None   Collection Time: 09/26/17  7:27 PM  Result Value Ref Range Status   Specimen Description  BLOOD PORT DRAW  Final   Special Requests   Final    BOTTLES DRAWN AEROBIC AND ANAEROBIC Blood Culture adequate volume   Culture NO GROWTH 5 DAYS  Final   Report Status 10/01/2017 FINAL  Final     Hospital CoursThis is a 76 year old who had been in the hospital with pneumonia.  She was discharged in improved condition but did not drink well or eat at home. She came back to the emergency department with acute kidney injury and increasing confusion.  she was dehydrated  .there was concern that she had pneumonia but that did not show on her chest x-ray   .she was given IV fluids and improved.  Her mental status improved.  She had elevated blood pressure and her medications were adjusted but some of that seems to be related to agitation.  She had physical therapy consultation and it was felt that she needed skilled care facility rehabilitation which has been arrangedDischarge Exam: Blood pressure (!) 180/91, pulse 70, temperature 98.6 F (37 C), temperature source Axillary, resp. rate 18, height 5\' 2"  (1.575 m), weight 89.4 kg (197 lb 1.5 oz), SpO2 100 %. She is awake and alert. Still mildly confused she is in atrial fibDisposition:  To skilled care facility.  She will have Ativan available For confusion and agitation heart healthy carb modified  diet. BMET on 10/03/2017. She may need further adjustment of BP meds.   Contact information for after-discharge care    Destination    [facility] .   Specialty:  Skilled Nursing Facility Contact information: [address] [place] [address] [phone]              Signed: Fredirick Maudlin   10/01/2017, 9:40 AM

## 2017-10-02 DIAGNOSIS — E1140 Type 2 diabetes mellitus with diabetic neuropathy, unspecified: Secondary | ICD-10-CM | POA: Diagnosis not present

## 2017-10-02 DIAGNOSIS — I25118 Atherosclerotic heart disease of native coronary artery with other forms of angina pectoris: Secondary | ICD-10-CM | POA: Diagnosis not present

## 2017-10-02 DIAGNOSIS — M6281 Muscle weakness (generalized): Secondary | ICD-10-CM | POA: Diagnosis not present

## 2017-10-02 DIAGNOSIS — I10 Essential (primary) hypertension: Secondary | ICD-10-CM | POA: Diagnosis not present

## 2017-10-02 DIAGNOSIS — D649 Anemia, unspecified: Secondary | ICD-10-CM | POA: Diagnosis not present

## 2017-10-02 DIAGNOSIS — E118 Type 2 diabetes mellitus with unspecified complications: Secondary | ICD-10-CM | POA: Diagnosis not present

## 2017-10-02 DIAGNOSIS — N179 Acute kidney failure, unspecified: Secondary | ICD-10-CM | POA: Diagnosis not present

## 2017-10-02 DIAGNOSIS — I5020 Unspecified systolic (congestive) heart failure: Secondary | ICD-10-CM | POA: Diagnosis not present

## 2017-10-02 DIAGNOSIS — I4891 Unspecified atrial fibrillation: Secondary | ICD-10-CM | POA: Diagnosis not present

## 2017-10-03 DIAGNOSIS — E785 Hyperlipidemia, unspecified: Secondary | ICD-10-CM | POA: Diagnosis not present

## 2017-10-03 DIAGNOSIS — I5032 Chronic diastolic (congestive) heart failure: Secondary | ICD-10-CM | POA: Diagnosis not present

## 2017-10-03 DIAGNOSIS — E119 Type 2 diabetes mellitus without complications: Secondary | ICD-10-CM | POA: Diagnosis not present

## 2017-10-03 DIAGNOSIS — I10 Essential (primary) hypertension: Secondary | ICD-10-CM | POA: Diagnosis not present

## 2017-10-03 DIAGNOSIS — R300 Dysuria: Secondary | ICD-10-CM | POA: Diagnosis not present

## 2017-10-03 DIAGNOSIS — I4891 Unspecified atrial fibrillation: Secondary | ICD-10-CM | POA: Diagnosis not present

## 2017-10-03 DIAGNOSIS — M6281 Muscle weakness (generalized): Secondary | ICD-10-CM | POA: Diagnosis not present

## 2017-10-03 DIAGNOSIS — K219 Gastro-esophageal reflux disease without esophagitis: Secondary | ICD-10-CM | POA: Diagnosis not present

## 2017-10-03 DIAGNOSIS — I2510 Atherosclerotic heart disease of native coronary artery without angina pectoris: Secondary | ICD-10-CM | POA: Diagnosis not present

## 2017-10-05 DIAGNOSIS — I25118 Atherosclerotic heart disease of native coronary artery with other forms of angina pectoris: Secondary | ICD-10-CM | POA: Diagnosis not present

## 2017-10-05 DIAGNOSIS — E118 Type 2 diabetes mellitus with unspecified complications: Secondary | ICD-10-CM | POA: Diagnosis not present

## 2017-10-05 DIAGNOSIS — N179 Acute kidney failure, unspecified: Secondary | ICD-10-CM | POA: Diagnosis not present

## 2017-10-05 DIAGNOSIS — D649 Anemia, unspecified: Secondary | ICD-10-CM | POA: Diagnosis not present

## 2017-10-05 DIAGNOSIS — I5020 Unspecified systolic (congestive) heart failure: Secondary | ICD-10-CM | POA: Diagnosis not present

## 2017-10-05 DIAGNOSIS — I4891 Unspecified atrial fibrillation: Secondary | ICD-10-CM | POA: Diagnosis not present

## 2017-10-05 DIAGNOSIS — E1140 Type 2 diabetes mellitus with diabetic neuropathy, unspecified: Secondary | ICD-10-CM | POA: Diagnosis not present

## 2017-10-05 DIAGNOSIS — I10 Essential (primary) hypertension: Secondary | ICD-10-CM | POA: Diagnosis not present

## 2017-10-05 DIAGNOSIS — M6281 Muscle weakness (generalized): Secondary | ICD-10-CM | POA: Diagnosis not present

## 2017-10-10 DIAGNOSIS — I25118 Atherosclerotic heart disease of native coronary artery with other forms of angina pectoris: Secondary | ICD-10-CM | POA: Diagnosis not present

## 2017-10-10 DIAGNOSIS — E118 Type 2 diabetes mellitus with unspecified complications: Secondary | ICD-10-CM | POA: Diagnosis not present

## 2017-10-10 DIAGNOSIS — I10 Essential (primary) hypertension: Secondary | ICD-10-CM | POA: Diagnosis not present

## 2017-10-10 DIAGNOSIS — M6281 Muscle weakness (generalized): Secondary | ICD-10-CM | POA: Diagnosis not present

## 2017-10-10 DIAGNOSIS — I5020 Unspecified systolic (congestive) heart failure: Secondary | ICD-10-CM | POA: Diagnosis not present

## 2017-10-10 DIAGNOSIS — N179 Acute kidney failure, unspecified: Secondary | ICD-10-CM | POA: Diagnosis not present

## 2017-10-10 DIAGNOSIS — I4891 Unspecified atrial fibrillation: Secondary | ICD-10-CM | POA: Diagnosis not present

## 2017-10-10 DIAGNOSIS — D649 Anemia, unspecified: Secondary | ICD-10-CM | POA: Diagnosis not present

## 2017-10-10 DIAGNOSIS — E1140 Type 2 diabetes mellitus with diabetic neuropathy, unspecified: Secondary | ICD-10-CM | POA: Diagnosis not present

## 2017-10-17 DIAGNOSIS — E118 Type 2 diabetes mellitus with unspecified complications: Secondary | ICD-10-CM | POA: Diagnosis not present

## 2017-10-17 DIAGNOSIS — I5020 Unspecified systolic (congestive) heart failure: Secondary | ICD-10-CM | POA: Diagnosis not present

## 2017-10-17 DIAGNOSIS — M6281 Muscle weakness (generalized): Secondary | ICD-10-CM | POA: Diagnosis not present

## 2017-10-17 DIAGNOSIS — I25118 Atherosclerotic heart disease of native coronary artery with other forms of angina pectoris: Secondary | ICD-10-CM | POA: Diagnosis not present

## 2017-10-17 DIAGNOSIS — E1140 Type 2 diabetes mellitus with diabetic neuropathy, unspecified: Secondary | ICD-10-CM | POA: Diagnosis not present

## 2017-10-17 DIAGNOSIS — I10 Essential (primary) hypertension: Secondary | ICD-10-CM | POA: Diagnosis not present

## 2017-10-17 DIAGNOSIS — I4891 Unspecified atrial fibrillation: Secondary | ICD-10-CM | POA: Diagnosis not present

## 2017-10-17 DIAGNOSIS — N179 Acute kidney failure, unspecified: Secondary | ICD-10-CM | POA: Diagnosis not present

## 2017-10-17 DIAGNOSIS — D649 Anemia, unspecified: Secondary | ICD-10-CM | POA: Diagnosis not present

## 2017-10-18 ENCOUNTER — Inpatient Hospital Stay (HOSPITAL_COMMUNITY)
Admission: EM | Admit: 2017-10-18 | Discharge: 2017-10-23 | DRG: 689 | Disposition: A | Discharge status: Skilled Nursing Facility | Payer: Medicare HMO | Attending: Pulmonary Disease | Admitting: Pulmonary Disease

## 2017-10-18 ENCOUNTER — Other Ambulatory Visit: Payer: Self-pay

## 2017-10-18 ENCOUNTER — Encounter (HOSPITAL_COMMUNITY): Payer: Self-pay | Admitting: Emergency Medicine

## 2017-10-18 ENCOUNTER — Emergency Department (HOSPITAL_COMMUNITY): Payer: Medicare HMO

## 2017-10-18 DIAGNOSIS — R1084 Generalized abdominal pain: Secondary | ICD-10-CM | POA: Diagnosis not present

## 2017-10-18 DIAGNOSIS — N3941 Urge incontinence: Secondary | ICD-10-CM | POA: Diagnosis present

## 2017-10-18 DIAGNOSIS — I700 Atherosclerosis of aorta: Secondary | ICD-10-CM | POA: Diagnosis present

## 2017-10-18 DIAGNOSIS — D638 Anemia in other chronic diseases classified elsewhere: Secondary | ICD-10-CM | POA: Diagnosis present

## 2017-10-18 DIAGNOSIS — I10 Essential (primary) hypertension: Secondary | ICD-10-CM | POA: Diagnosis present

## 2017-10-18 DIAGNOSIS — Z96653 Presence of artificial knee joint, bilateral: Secondary | ICD-10-CM | POA: Diagnosis present

## 2017-10-18 DIAGNOSIS — I2510 Atherosclerotic heart disease of native coronary artery without angina pectoris: Secondary | ICD-10-CM | POA: Diagnosis present

## 2017-10-18 DIAGNOSIS — Z86718 Personal history of other venous thrombosis and embolism: Secondary | ICD-10-CM

## 2017-10-18 DIAGNOSIS — R41841 Cognitive communication deficit: Secondary | ICD-10-CM | POA: Diagnosis not present

## 2017-10-18 DIAGNOSIS — R112 Nausea with vomiting, unspecified: Secondary | ICD-10-CM

## 2017-10-18 DIAGNOSIS — F0390 Unspecified dementia without behavioral disturbance: Secondary | ICD-10-CM | POA: Diagnosis present

## 2017-10-18 DIAGNOSIS — Z91010 Allergy to peanuts: Secondary | ICD-10-CM

## 2017-10-18 DIAGNOSIS — M48062 Spinal stenosis, lumbar region with neurogenic claudication: Secondary | ICD-10-CM | POA: Diagnosis present

## 2017-10-18 DIAGNOSIS — R1111 Vomiting without nausea: Secondary | ICD-10-CM | POA: Diagnosis not present

## 2017-10-18 DIAGNOSIS — G40909 Epilepsy, unspecified, not intractable, without status epilepticus: Secondary | ICD-10-CM | POA: Diagnosis present

## 2017-10-18 DIAGNOSIS — E860 Dehydration: Secondary | ICD-10-CM | POA: Diagnosis present

## 2017-10-18 DIAGNOSIS — N179 Acute kidney failure, unspecified: Secondary | ICD-10-CM | POA: Diagnosis not present

## 2017-10-18 DIAGNOSIS — Z881 Allergy status to other antibiotic agents status: Secondary | ICD-10-CM

## 2017-10-18 DIAGNOSIS — Z7901 Long term (current) use of anticoagulants: Secondary | ICD-10-CM

## 2017-10-18 DIAGNOSIS — E119 Type 2 diabetes mellitus without complications: Secondary | ICD-10-CM | POA: Diagnosis not present

## 2017-10-18 DIAGNOSIS — N183 Chronic kidney disease, stage 3 unspecified: Secondary | ICD-10-CM | POA: Diagnosis present

## 2017-10-18 DIAGNOSIS — N3000 Acute cystitis without hematuria: Secondary | ICD-10-CM

## 2017-10-18 DIAGNOSIS — Z951 Presence of aortocoronary bypass graft: Secondary | ICD-10-CM

## 2017-10-18 DIAGNOSIS — G9341 Metabolic encephalopathy: Secondary | ICD-10-CM | POA: Diagnosis present

## 2017-10-18 DIAGNOSIS — I5032 Chronic diastolic (congestive) heart failure: Secondary | ICD-10-CM | POA: Diagnosis not present

## 2017-10-18 DIAGNOSIS — G8929 Other chronic pain: Secondary | ICD-10-CM | POA: Diagnosis present

## 2017-10-18 DIAGNOSIS — Z888 Allergy status to other drugs, medicaments and biological substances status: Secondary | ICD-10-CM

## 2017-10-18 DIAGNOSIS — D649 Anemia, unspecified: Secondary | ICD-10-CM | POA: Diagnosis present

## 2017-10-18 DIAGNOSIS — Z87891 Personal history of nicotine dependence: Secondary | ICD-10-CM

## 2017-10-18 DIAGNOSIS — Z7984 Long term (current) use of oral hypoglycemic drugs: Secondary | ICD-10-CM

## 2017-10-18 DIAGNOSIS — E1151 Type 2 diabetes mellitus with diabetic peripheral angiopathy without gangrene: Secondary | ICD-10-CM | POA: Diagnosis present

## 2017-10-18 DIAGNOSIS — E669 Obesity, unspecified: Secondary | ICD-10-CM | POA: Diagnosis present

## 2017-10-18 DIAGNOSIS — Z8711 Personal history of peptic ulcer disease: Secondary | ICD-10-CM

## 2017-10-18 DIAGNOSIS — N390 Urinary tract infection, site not specified: Secondary | ICD-10-CM | POA: Diagnosis not present

## 2017-10-18 DIAGNOSIS — R109 Unspecified abdominal pain: Secondary | ICD-10-CM | POA: Diagnosis not present

## 2017-10-18 DIAGNOSIS — Z8673 Personal history of transient ischemic attack (TIA), and cerebral infarction without residual deficits: Secondary | ICD-10-CM

## 2017-10-18 DIAGNOSIS — E1143 Type 2 diabetes mellitus with diabetic autonomic (poly)neuropathy: Secondary | ICD-10-CM | POA: Diagnosis present

## 2017-10-18 DIAGNOSIS — R197 Diarrhea, unspecified: Secondary | ICD-10-CM

## 2017-10-18 DIAGNOSIS — I252 Old myocardial infarction: Secondary | ICD-10-CM | POA: Diagnosis not present

## 2017-10-18 DIAGNOSIS — R627 Adult failure to thrive: Secondary | ICD-10-CM | POA: Diagnosis present

## 2017-10-18 DIAGNOSIS — E1122 Type 2 diabetes mellitus with diabetic chronic kidney disease: Secondary | ICD-10-CM | POA: Diagnosis present

## 2017-10-18 DIAGNOSIS — Z96643 Presence of artificial hip joint, bilateral: Secondary | ICD-10-CM | POA: Diagnosis present

## 2017-10-18 DIAGNOSIS — E890 Postprocedural hypothyroidism: Secondary | ICD-10-CM | POA: Diagnosis present

## 2017-10-18 DIAGNOSIS — Z950 Presence of cardiac pacemaker: Secondary | ICD-10-CM | POA: Diagnosis present

## 2017-10-18 DIAGNOSIS — J189 Pneumonia, unspecified organism: Secondary | ICD-10-CM | POA: Diagnosis not present

## 2017-10-18 DIAGNOSIS — J440 Chronic obstructive pulmonary disease with acute lower respiratory infection: Secondary | ICD-10-CM | POA: Diagnosis present

## 2017-10-18 DIAGNOSIS — Z9049 Acquired absence of other specified parts of digestive tract: Secondary | ICD-10-CM

## 2017-10-18 DIAGNOSIS — I130 Hypertensive heart and chronic kidney disease with heart failure and stage 1 through stage 4 chronic kidney disease, or unspecified chronic kidney disease: Secondary | ICD-10-CM | POA: Diagnosis present

## 2017-10-18 DIAGNOSIS — Z8249 Family history of ischemic heart disease and other diseases of the circulatory system: Secondary | ICD-10-CM

## 2017-10-18 DIAGNOSIS — Z90710 Acquired absence of both cervix and uterus: Secondary | ICD-10-CM

## 2017-10-18 DIAGNOSIS — K219 Gastro-esophageal reflux disease without esophagitis: Secondary | ICD-10-CM | POA: Diagnosis present

## 2017-10-18 DIAGNOSIS — F0391 Unspecified dementia with behavioral disturbance: Secondary | ICD-10-CM | POA: Diagnosis not present

## 2017-10-18 DIAGNOSIS — B961 Klebsiella pneumoniae [K. pneumoniae] as the cause of diseases classified elsewhere: Secondary | ICD-10-CM | POA: Diagnosis present

## 2017-10-18 DIAGNOSIS — R279 Unspecified lack of coordination: Secondary | ICD-10-CM | POA: Diagnosis not present

## 2017-10-18 DIAGNOSIS — Z91018 Allergy to other foods: Secondary | ICD-10-CM

## 2017-10-18 DIAGNOSIS — R410 Disorientation, unspecified: Secondary | ICD-10-CM | POA: Diagnosis not present

## 2017-10-18 DIAGNOSIS — I480 Paroxysmal atrial fibrillation: Secondary | ICD-10-CM | POA: Diagnosis present

## 2017-10-18 DIAGNOSIS — R2689 Other abnormalities of gait and mobility: Secondary | ICD-10-CM | POA: Diagnosis not present

## 2017-10-18 DIAGNOSIS — M6281 Muscle weakness (generalized): Secondary | ICD-10-CM | POA: Diagnosis not present

## 2017-10-18 DIAGNOSIS — M1612 Unilateral primary osteoarthritis, left hip: Secondary | ICD-10-CM | POA: Diagnosis present

## 2017-10-18 DIAGNOSIS — Z7401 Bed confinement status: Secondary | ICD-10-CM | POA: Diagnosis not present

## 2017-10-18 DIAGNOSIS — Z79899 Other long term (current) drug therapy: Secondary | ICD-10-CM

## 2017-10-18 DIAGNOSIS — E785 Hyperlipidemia, unspecified: Secondary | ICD-10-CM | POA: Diagnosis present

## 2017-10-18 DIAGNOSIS — I4891 Unspecified atrial fibrillation: Secondary | ICD-10-CM | POA: Diagnosis present

## 2017-10-18 DIAGNOSIS — K3184 Gastroparesis: Secondary | ICD-10-CM | POA: Diagnosis present

## 2017-10-18 DIAGNOSIS — Z981 Arthrodesis status: Secondary | ICD-10-CM

## 2017-10-18 DIAGNOSIS — Z885 Allergy status to narcotic agent status: Secondary | ICD-10-CM

## 2017-10-18 DIAGNOSIS — Z79891 Long term (current) use of opiate analgesic: Secondary | ICD-10-CM

## 2017-10-18 HISTORY — DX: Disorientation, unspecified: R41.0

## 2017-10-18 LAB — URINALYSIS, ROUTINE W REFLEX MICROSCOPIC
Bilirubin Urine: NEGATIVE
GLUCOSE, UA: NEGATIVE mg/dL
Ketones, ur: NEGATIVE mg/dL
Nitrite: NEGATIVE
PH: 5 (ref 5.0–8.0)
PROTEIN: 30 mg/dL — AB
SPECIFIC GRAVITY, URINE: 1.014 (ref 1.005–1.030)

## 2017-10-18 LAB — COMPREHENSIVE METABOLIC PANEL
ALK PHOS: 62 U/L (ref 38–126)
ALT: 13 U/L — AB (ref 14–54)
AST: 22 U/L (ref 15–41)
Albumin: 3.2 g/dL — ABNORMAL LOW (ref 3.5–5.0)
Anion gap: 8 (ref 5–15)
BILIRUBIN TOTAL: 0.4 mg/dL (ref 0.3–1.2)
BUN: 21 mg/dL — AB (ref 6–20)
CALCIUM: 9.2 mg/dL (ref 8.9–10.3)
CO2: 22 mmol/L (ref 22–32)
CREATININE: 1.58 mg/dL — AB (ref 0.44–1.00)
Chloride: 109 mmol/L (ref 101–111)
GFR calc Af Amer: 35 mL/min — ABNORMAL LOW (ref >60)
GFR, EST NON AFRICAN AMERICAN: 30 mL/min — AB (ref >60)
Glucose, Bld: 105 mg/dL — ABNORMAL HIGH (ref 65–99)
POTASSIUM: 3.7 mmol/L (ref 3.5–5.1)
Sodium: 139 mmol/L (ref 135–145)
TOTAL PROTEIN: 6.7 g/dL (ref 6.5–8.1)

## 2017-10-18 LAB — CBC WITH DIFFERENTIAL/PLATELET
BASOS ABS: 0 10*3/uL (ref 0.0–0.1)
Basophils Relative: 1 %
EOS PCT: 3 %
Eosinophils Absolute: 0.2 10*3/uL (ref 0.0–0.7)
HCT: 29.7 % — ABNORMAL LOW (ref 36.0–46.0)
Hemoglobin: 9 g/dL — ABNORMAL LOW (ref 12.0–15.0)
LYMPHS PCT: 22 %
Lymphs Abs: 1.5 10*3/uL (ref 0.7–4.0)
MCH: 23.4 pg — ABNORMAL LOW (ref 26.0–34.0)
MCHC: 30.3 g/dL (ref 30.0–36.0)
MCV: 77.3 fL — AB (ref 78.0–100.0)
Monocytes Absolute: 0.5 10*3/uL (ref 0.1–1.0)
Monocytes Relative: 8 %
Neutro Abs: 4.5 10*3/uL (ref 1.7–7.7)
Neutrophils Relative %: 66 %
PLATELETS: 239 10*3/uL (ref 150–400)
RBC: 3.84 MIL/uL — AB (ref 3.87–5.11)
RDW: 20.1 % — ABNORMAL HIGH (ref 11.5–15.5)
WBC: 6.8 10*3/uL (ref 4.0–10.5)

## 2017-10-18 LAB — MAGNESIUM: MAGNESIUM: 1.1 mg/dL — AB (ref 1.7–2.4)

## 2017-10-18 LAB — LIPASE, BLOOD: Lipase: 54 U/L — ABNORMAL HIGH (ref 11–51)

## 2017-10-18 LAB — LACTIC ACID, PLASMA
LACTIC ACID, VENOUS: 0.9 mmol/L (ref 0.5–1.9)
Lactic Acid, Venous: 1.2 mmol/L (ref 0.5–1.9)

## 2017-10-18 LAB — TROPONIN I: Troponin I: <0.03 ng/mL (ref <0.03)

## 2017-10-18 MED ORDER — SENNOSIDES-DOCUSATE SODIUM 8.6-50 MG PO TABS: 1.0000 | ORAL_TABLET | Freq: Every evening | ORAL | Status: DC | PRN

## 2017-10-18 MED ORDER — GABAPENTIN 100 MG PO CAPS
200.0000 mg | ORAL_CAPSULE | Freq: Every day | ORAL | Status: DC
  Administered 2017-10-18 – 2017-10-22 (×5): 200 mg via ORAL
  Filled 2017-10-18 (×5): qty 2

## 2017-10-18 MED ORDER — LEVETIRACETAM 500 MG PO TABS
500.0000 mg | ORAL_TABLET | Freq: Two times a day (BID) | ORAL | Status: DC
  Administered 2017-10-18 – 2017-10-23 (×10): 500 mg via ORAL
  Filled 2017-10-18 (×10): qty 1

## 2017-10-18 MED ORDER — PANTOPRAZOLE SODIUM 40 MG PO TBEC
40.0000 mg | DELAYED_RELEASE_TABLET | Freq: Every day | ORAL | Status: DC
  Administered 2017-10-18 – 2017-10-23 (×6): 40 mg via ORAL
  Filled 2017-10-18 (×6): qty 1

## 2017-10-18 MED ORDER — METOPROLOL TARTRATE 25 MG PO TABS
12.5000 mg | ORAL_TABLET | Freq: Two times a day (BID) | ORAL | Status: DC
  Administered 2017-10-18 – 2017-10-23 (×10): 12.5 mg via ORAL
  Filled 2017-10-18 (×10): qty 1

## 2017-10-18 MED ORDER — MAGNESIUM SULFATE 2 GM/50ML IV SOLN
2.0000 g | Freq: Once | INTRAVENOUS | Status: AC
  Administered 2017-10-18: 2 g via INTRAVENOUS
  Filled 2017-10-18: qty 50

## 2017-10-18 MED ORDER — DEXTROSE 5 % IV SOLN
1.0000 g | INTRAVENOUS | Status: DC
  Administered 2017-10-19 – 2017-10-22 (×4): 1 g via INTRAVENOUS
  Filled 2017-10-18 (×5): qty 10

## 2017-10-18 MED ORDER — ONDANSETRON HCL 4 MG/2ML IJ SOLN
4.0000 mg | INTRAMUSCULAR | Status: DC | PRN
  Administered 2017-10-18: 4 mg via INTRAVENOUS
  Filled 2017-10-18: qty 2

## 2017-10-18 MED ORDER — TRAZODONE HCL 50 MG PO TABS
50.0000 mg | ORAL_TABLET | Freq: Every day | ORAL | Status: DC
  Administered 2017-10-18 – 2017-10-22 (×5): 50 mg via ORAL
  Filled 2017-10-18 (×5): qty 1

## 2017-10-18 MED ORDER — ATORVASTATIN CALCIUM 40 MG PO TABS
40.0000 mg | ORAL_TABLET | Freq: Every day | ORAL | Status: DC
  Administered 2017-10-18 – 2017-10-22 (×5): 40 mg via ORAL
  Filled 2017-10-18 (×5): qty 1

## 2017-10-18 MED ORDER — FERROUS SULFATE 325 (65 FE) MG PO TABS
325.0000 mg | ORAL_TABLET | Freq: Every day | ORAL | Status: DC
  Administered 2017-10-19 – 2017-10-23 (×5): 325 mg via ORAL
  Filled 2017-10-18 (×5): qty 1

## 2017-10-18 MED ORDER — ONDANSETRON HCL 4 MG/2ML IJ SOLN: 4.0000 mg | Freq: Four times a day (QID) | INTRAMUSCULAR | Status: DC | PRN

## 2017-10-18 MED ORDER — ACETAMINOPHEN 650 MG RE SUPP: 650.0000 mg | Freq: Four times a day (QID) | RECTAL | Status: DC | PRN

## 2017-10-18 MED ORDER — OMEGA-3-ACID ETHYL ESTERS 1 G PO CAPS
2.0000 g | ORAL_CAPSULE | Freq: Two times a day (BID) | ORAL | Status: DC
  Administered 2017-10-18 – 2017-10-23 (×10): 2 g via ORAL
  Filled 2017-10-18 (×10): qty 2

## 2017-10-18 MED ORDER — ONDANSETRON HCL 4 MG PO TABS: 4.0000 mg | ORAL_TABLET | Freq: Four times a day (QID) | ORAL | Status: DC | PRN

## 2017-10-18 MED ORDER — NITROGLYCERIN 0.4 MG SL SUBL: 0.4000 mg | SUBLINGUAL_TABLET | SUBLINGUAL | Status: DC | PRN

## 2017-10-18 MED ORDER — DEXTROSE 5 % IV SOLN
1.0000 g | Freq: Once | INTRAVENOUS | Status: AC
  Administered 2017-10-18: 1 g via INTRAVENOUS
  Filled 2017-10-18: qty 10

## 2017-10-18 MED ORDER — ASPIRIN EC 81 MG PO TBEC
81.0000 mg | DELAYED_RELEASE_TABLET | Freq: Every day | ORAL | Status: DC
  Administered 2017-10-18 – 2017-10-23 (×6): 81 mg via ORAL
  Filled 2017-10-18 (×6): qty 1

## 2017-10-18 MED ORDER — ACETAMINOPHEN 325 MG PO TABS
650.0000 mg | ORAL_TABLET | Freq: Four times a day (QID) | ORAL | Status: DC | PRN
  Administered 2017-10-21 – 2017-10-22 (×2): 650 mg via ORAL
  Filled 2017-10-18 (×2): qty 2

## 2017-10-18 MED ORDER — SENNOSIDES-DOCUSATE SODIUM 8.6-50 MG PO TABS
1.0000 | ORAL_TABLET | Freq: Two times a day (BID) | ORAL | Status: DC
  Administered 2017-10-18 – 2017-10-23 (×10): 1 via ORAL
  Filled 2017-10-18 (×10): qty 1

## 2017-10-18 MED ORDER — SODIUM CHLORIDE 0.9 % IV SOLN
INTRAVENOUS | Status: DC
  Administered 2017-10-18: 15:00:00 via INTRAVENOUS

## 2017-10-18 MED ORDER — APIXABAN 5 MG PO TABS
5.0000 mg | ORAL_TABLET | Freq: Two times a day (BID) | ORAL | Status: DC
  Administered 2017-10-18 – 2017-10-23 (×10): 5 mg via ORAL
  Filled 2017-10-18 (×10): qty 1

## 2017-10-18 MED ORDER — SODIUM CHLORIDE 0.9 % IV SOLN
INTRAVENOUS | Status: AC
  Administered 2017-10-18: 17:00:00 via INTRAVENOUS

## 2017-10-18 MED ORDER — MORPHINE SULFATE (PF) 2 MG/ML IV SOLN
2.0000 mg | INTRAVENOUS | Status: DC | PRN
  Administered 2017-10-18: 2 mg via INTRAVENOUS
  Filled 2017-10-18: qty 1

## 2017-10-18 NOTE — Progress Notes (Signed)
The patient has arrived in the room on AP300 and Dia CrawfordLisa Aiysha Jillson, RN has assumed care of the patient.

## 2017-10-18 NOTE — ED Notes (Signed)
Pt tolerated water without difficulty for fluid challenge.

## 2017-10-18 NOTE — ED Notes (Signed)
Pt ambulated in hallways with walker and standby assist. Pt had to be directed on when to turn the walker because she was walking into objects. Pt got to a wall and attempted to keep walking without realizing there was a wall in front of her. Pt able to redirected after a few attempts.

## 2017-10-18 NOTE — ED Provider Notes (Signed)
[facility] EMERGENCY DEPARTMENT Provider Note   CSN: [id number] Arrival date & time: 10/18/17  4098     History   Chief Complaint Chief Complaint  Patient presents with  . Abdominal Pain    HPI [name] is a 76 y.o. female.  HPI  Pt was seen at 0825.  Per pt, c/o gradual onset and persistence of constant generalized abd "pain" since yesterday.  Has been associated with multiple intermittent episodes of N/V/D.  Describes the abd pain as "aching."  Pt's daughter told EMS that pt was d/c from SNF to home yesterday. Pt's daughter states pt has also had foul smelling urine, urinary incont, generalized weakness, and episodes of "confusion."  Denies fevers, no back pain, no rash, no CP/SOB, no black or blood in stools or emesis.      Past Medical History:  Diagnosis Date  . Anemia   . Anginal pain (HCC)    "all the time, but not heart related"  . Arthritis    "legs and back" (09/30/2013)  . Asthma    years ago  . 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 Blurred vision, bilateral    sometimes double vision  . CAD (coronary artery disease)    multivessel, CABG 6/10.  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  Myoview (11/20/11) showed no ischemia & EF 62%  . Chronic lower back pain   . Confusion   . COPD (chronic obstructive pulmonary disease) (HCC)    severe lung disease by PFTs 6/12  . DDD (degenerative disc disease)   . Deep vein thrombosis, upper right extremity (HCC)    post-PPM  . Dehiscence of closure of sternum or sternotomy 07/2009   sternal infection; required flap closure  . Diabetes mellitus, type II (HCC)   . Gastroesophageal reflux disease    not much  . H/O hiatal hernia   . History of kidney stones   . Hyperlipidemia   . Hypertension   . Migraines    "years ago" (09/30/2013)  . Obesity   . Orthopnea    sometimes  . Pacemaker   . PUD (peptic ulcer disease)   . Seizures (HCC)    "long time ago; don't remember what they  were related to" (09/30/2013)none recent  . Sleep apnea    denies CPAP use on 09/30/2013  . Stroke [facility])    years ago, "residual on left back"  . Urge incontinence of urine     Patient Active Problem List   Diagnosis Date Noted  . HCAP (healthcare-associated pneumonia) 09/26/2017  . Lobar pneumonia (HCC) 09/19/2017  . Acute metabolic encephalopathy 09/19/2017  . Concussion with no loss of consciousness 09/28/2016  . AKI (acute kidney injury) (HCC) 09/25/2016  . Acute kidney injury (nontraumatic) (HCC) 09/25/2016  . Fall 09/25/2016  . Acute kidney injury (HCC) 09/25/2016  . Chest pain 08/29/2016  . Atrial fibrillation (HCC) 02/06/2015  . Encephalopathy, metabolic 10/12/2014  . Intractable nausea and vomiting 10/09/2014  . Epigastric abdominal pain 10/09/2014  . Expected blood loss anemia 12/04/2013  . S/P left THA, AA 12/03/2013  . Protein-calorie malnutrition, severe (HCC) 10/02/2013  . Precordial pain 09/30/2013  . Failure to thrive 07/26/2013  . Anemia of chronic disease 07/13/2013  . Altered mental state 07/13/2013  . NSTEMI (non-ST elevated myocardial infarction) (HCC) 07/01/2013  . Acute delirium 06/29/2013  . Fecal impaction (HCC) 06/29/2013  . Total knee replacement status 05/22/2013  . H/O total hip arthroplasty 05/22/2013  .  Spinal stenosis, lumbar region, with neurogenic claudication 05/22/2013  . Osteoarthritis of left hip 05/22/2013  . Noninfectious gastroenteritis and colitis 03/05/2013  . Dehydration 03/05/2013  . Family history of colon cancer 02/15/2013  . PUD (peptic ulcer disease) 02/15/2013  . Diabetes mellitus, type II (HCC)   . Hypertension   . Arteriosclerotic cardiovascular disease (ASCVD)   . Gastroesophageal reflux disease   . Hyperlipidemia   . Deep vein thrombosis, upper right extremity (HCC)   . Obesity   . AV block   . COPD (chronic obstructive pulmonary disease) (HCC)   . Sleep apnea   . Chronic kidney disease (CKD), stage III  (moderate) (HCC)   . Seizure disorder (HCC)   . Anemia, normocytic normochromic 12/15/2011  . Chronic diastolic heart failure (HCC) 11/18/2011  . PACEMAKER, PERMANENT 05/13/2009  . 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 GASTRIC ULCER 05/12/2009  . Gastroparesis 05/12/2009  . DIVERTICULAR DISEASE 05/12/2009    Past Surgical History:  Procedure Laterality Date  . ABDOMINAL HYSTERECTOMY     partial  . APPENDECTOMY    . BACK SURGERY     x3 total  . CESAREAN SECTION  ~1980  . CHOLECYSTECTOMY    . COLONOSCOPY  04/27/2005   hemorrhoids, diverticula  . CORONARY ARTERY BYPASS GRAFT  05/1999   - LIMA to LAD, SVG to OM, SVG to PDA  . ESOPHAGOGASTRODUODENOSCOPY     [facility]  biopsy, and disruption Schatzki's ring  . INSERT / REPLACE / REMOVE PACEMAKER  1993  . JOINT REPLACEMENT    . LUMBAR DISC SURGERY    . PACEMAKER PLACEMENT  1993   Status post DDD pacemaker implantation in 1993, with Medtronic Kappa generator change in 2003 for  second-degree AV block, most recent gen change by Fawn KirkJA 04/14/11  . POSTERIOR LUMBAR FUSION    . RECONSTRUCTIVE REPAIR STERNAL     for infection s/p CABG 8/10  . REPLACEMENT TOTAL KNEE Bilateral   . THYROIDECTOMY    . TOTAL HIP ARTHROPLASTY Right   . TUBAL LIGATION    . WRIST SURGERY Left    "tumor taken off" (09/30/2013)    OB History    Gravida Para Term Preterm AB Living   5 5 5     4    SAB TAB Ectopic Multiple Live Births                   Home Medications    Prior to Admission medications   Medication Sig Start Date End Date Taking? Authorizing Provider  acetaminophen (TYLENOL) 325 MG tablet Take 325-650 mg by mouth every 6 (six) hours as needed for mild pain, moderate pain or headache.     [provider]  apixaban (ELIQUIS) 5 MG TABS tablet Take 5 mg by mouth 2 (two) times daily.     [provider]  aspirin EC 81 MG tablet Take 81 mg by mouth daily.    [provider]  atorvastatin (LIPITOR) 40 MG tablet Take  40 mg by mouth at bedtime.    [provider]  benazepril (LOTENSIN) 40 MG tablet Take 40 mg by mouth daily.     [provider]  ferrous sulfate 325 (65 FE) MG tablet Take 325 mg by mouth daily with breakfast.    [provider]  furosemide (LASIX) 40 MG tablet Take 40 mg by mouth daily as needed for fluid.    [provider]  gabapentin (NEURONTIN) 100 MG capsule Take 200 mg by mouth  at bedtime.    [provider]  ibuprofen (ADVIL,MOTRIN) 200 MG tablet Take 600 mg by mouth every 6 (six) hours as needed for headache, mild pain or moderate pain.    [provider]  levETIRAcetam (KEPPRA) 500 MG tablet Take 500 mg by mouth 2 (two) times daily.    [provider]  LORazepam (ATIVAN) 0.5 MG tablet Take 1 tablet (0.5 mg total) by mouth every 8 (eight) hours as needed for anxiety. 10/01/17 10/01/18  Kari Baars, MD  lubiprostone (AMITIZA) 24 MCG capsule Take 24 mcg by mouth 2 (two) times daily with a meal.    [provider]  Melatonin 3 MG TABS Take 3 mg by mouth at bedtime as needed (for sleep).    [provider]  metFORMIN (GLUCOPHAGE) 500 MG tablet Take 500 mg by mouth 2 (two) times daily with a meal.     [provider]  metoprolol tartrate (LOPRESSOR) 25 MG tablet Take 12.5 mg by mouth 2 (two) times daily.     [provider]  nitroGLYCERIN (NITROSTAT) 0.4 MG SL tablet Place 1 tablet (0.4 mg total) under the tongue every 5 (five) minutes as needed for chest pain. 05/20/14   Antoine Poche, MD  omega-3 acid ethyl esters (LOVAZA) 1 G capsule Take 2 g by mouth 2 (two) times daily.     [provider]  ondansetron (ZOFRAN-ODT) 4 MG disintegrating tablet Take 4 mg by mouth every 8 (eight) hours as needed for nausea or vomiting.    [provider]  pantoprazole (PROTONIX) 40 MG tablet Take 1 tablet (40 mg total) by mouth daily. 12/03/13   Gelene Mink, NP  potassium chloride  (K-DUR,KLOR-CON) 10 MEQ tablet Take 10 mEq by mouth daily.    [provider]  senna-docusate (SENEXON-S) 8.6-50 MG tablet Take 1 tablet by mouth 2 (two) times daily.    [provider]  traZODone (DESYREL) 50 MG tablet Take 50 mg by mouth at bedtime.    [provider]    Family History Family History  Problem Relation Age of Onset  . Heart disease Mother        MI  . Colon cancer Father        age 65  . Diabetes Unknown   . Coronary artery disease Unknown        Female <55  . Cancer Unknown     Social History Social History   Tobacco Use  . Smoking status: Never Smoker  . Smokeless tobacco: Former Neurosurgeon    Types: Snuff  . Tobacco comment: 09/30/2013 "stopped snuff a long time ago"  Substance Use Topics  . Alcohol use: No  . Drug use: No     Allergies   Codeine; Food; Levaquin [levofloxacin]; Oxycodone hcl; Peanut-containing drug products; and Reglan [metoclopramide]   Review of Systems Review of Systems ROS: Statement: All systems negative except as marked or noted in the HPI; Constitutional: Negative for fever and chills. ; ; Eyes: Negative for eye pain, redness and discharge. ; ; ENMT: Negative for ear pain, hoarseness, nasal congestion, sinus pressure and sore throat. ; ; Cardiovascular: Negative for chest pain, palpitations, diaphoresis, dyspnea and peripheral edema. ; ; Respiratory: Negative for cough, wheezing and stridor. ; ; Gastrointestinal: +N/V/D, abd pain. Negative for blood in stool, hematemesis, jaundice and rectal bleeding. . ; ; Genitourinary: +"foul smelling urine." Negative for dysuria, flank pain and hematuria. ; ; Musculoskeletal: Negative for back pain and neck pain. Negative  for swelling and trauma.; ; Skin: Negative for pruritus, rash, abrasions, blisters, bruising and skin lesion.; ; Neuro: +generalized weakness, episodes of confusion. Negative for headache, lightheadedness and neck stiffness. Negative for altered level of  consciousness, extremity weakness, paresthesias, involuntary movement, seizure and syncope.       Physical Exam Updated Vital Signs BP (!) 116/55 (BP Location: Right Arm)   Pulse 67   Temp 98.1 F (36.7 C) (Oral)   Resp 18   Ht 5\' 2"  (1.575 m)   Wt 89.4 kg (197 lb)   SpO2 98%   BMI 36.03 kg/m    11:14:35 Orthostatic Vital Signs MW  Orthostatic Lying   BP- Lying: 115/63  Pulse- Lying: 67      Orthostatic Sitting  BP- Sitting: 131/66  Pulse- Sitting: 74      Orthostatic Standing at 0 minutes  BP- Standing at 0 minutes: 129/61  Pulse- Standing at 0 minutes: 76      Physical Exam 0830: Physical examination:  Nursing notes reviewed; Vital signs and O2 SAT reviewed;  Constitutional: Well developed, Well nourished, Well hydrated, Uncomfortable appearing.; Head:  Normocephalic, atraumatic; Eyes: EOMI, PERRL, No scleral icterus; ENMT: Mouth and pharynx normal, Mucous membranes moist; Neck: Supple, Full range of motion, No lymphadenopathy; Cardiovascular: Regular rate and rhythm, No gallop; Respiratory: Breath sounds clear & equal bilaterally, No wheezes.  Speaking full sentences with ease, Normal respiratory effort/excursion; Chest: Nontender, Movement normal; Abdomen: Soft, +diffuse tenderness to palp. No rebound or guarding. Nondistended, Normal bowel sounds; Genitourinary: No CVA tenderness; Extremities: Pulses normal, No tenderness, No edema, No calf edema or asymmetry.; Neuro: Awake, alert,  confused re: time, events. Major CN grossly intact. No facial droop. Speech clear. No gross focal motor deficits in extremities.; Skin: Color normal, Warm, Dry.   ED Treatments / Results  Labs (all labs ordered are listed, but only abnormal results are displayed)   EKG  EKG Interpretation  Date/Time:  Wednesday October 18 2017 09:21:04 EST Ventricular Rate:  82 PR Interval:    QRS Duration: 87 QT Interval:  560 QTC Calculation: 655 R Axis:   -46 Text Interpretation:   Afib/flut and V-paced complexes No further rhythm analysis attempted due to paced rhythm Left anterior fascicular block Abnormal R-wave progression, late transition Borderline abnrm T, anterolateral leads Prolonged QT interval When compared with ECG of 09/26/2017, 09/19/2017 QT has lengthened Confirmed by Samuel Jester [phone]) on 10/18/2017 10:19:05 AM       Radiology   Procedures Procedures (including critical care time)  Medications Ordered in ED Medications  ondansetron (ZOFRAN) injection 4 mg (not administered)  morphine 2 MG/ML injection 2 mg (not administered)     Initial Impression / Assessment and Plan / ED Course  I have reviewed the triage vital signs and the nursing notes.  Pertinent labs & imaging results that were available during my care of the patient were reviewed by me and considered in my medical decision making (see chart for details).  MDM Reviewed: nursing note, previous chart and vitals Reviewed previous: labs and ECG Interpretation: labs, ECG, x-ray and CT scan    Results for orders placed or performed during the hospital encounter of 10/18/17  Lipase, blood  Result Value Ref Range   Lipase 54 (H) 11 - 51 U/L  Comprehensive metabolic panel  Result Value Ref Range   Sodium 139 135 - 145 mmol/L   Potassium 3.7 3.5 - 5.1 mmol/L   Chloride 109 101 - 111 mmol/L   CO2 22 22 -  32 mmol/L   Glucose, Bld 105 (H) 65 - 99 mg/dL   BUN 21 (H) 6 - 20 mg/dL   Creatinine, Ser 1.91 (H) 0.44 - 1.00 mg/dL   Calcium 9.2 8.9 - 47.8 mg/dL   Total Protein 6.7 6.5 - 8.1 g/dL   Albumin 3.2 (L) 3.5 - 5.0 g/dL   AST 22 15 - 41 U/L   ALT 13 (L) 14 - 54 U/L   Alkaline Phosphatase 62 38 - 126 U/L   Total Bilirubin 0.4 0.3 - 1.2 mg/dL   GFR calc non Af Amer 30 (L) >60 mL/min   GFR calc Af Amer 35 (L) >60 mL/min   Anion gap 8 5 - 15  Urinalysis, Routine w reflex microscopic  Result Value Ref Range   Color, Urine YELLOW YELLOW   APPearance CLOUDY (A) CLEAR   Specific  Gravity, Urine 1.014 1.005 - 1.030   pH 5.0 5.0 - 8.0   Glucose, UA NEGATIVE NEGATIVE mg/dL   Hgb urine dipstick MODERATE (A) NEGATIVE   Bilirubin Urine NEGATIVE NEGATIVE   Ketones, ur NEGATIVE NEGATIVE mg/dL   Protein, ur 30 (A) NEGATIVE mg/dL   Nitrite NEGATIVE NEGATIVE   Leukocytes, UA LARGE (A) NEGATIVE   RBC / HPF 0-5 0 - 5 RBC/hpf   WBC, UA TOO NUMEROUS TO COUNT 0 - 5 WBC/hpf   Bacteria, UA MANY (A) NONE SEEN   Squamous Epithelial / LPF 0-5 (A) NONE SEEN   WBC Clumps PRESENT    Mucus PRESENT    Budding Yeast PRESENT    Hyaline Casts, UA PRESENT   Troponin I  Result Value Ref Range   Troponin I <0.03 <0.03 ng/mL  Lactic acid, plasma  Result Value Ref Range   Lactic Acid, Venous 1.2 0.5 - 1.9 mmol/L  Lactic acid, plasma  Result Value Ref Range   Lactic Acid, Venous 0.9 0.5 - 1.9 mmol/L  CBC with Differential  Result Value Ref Range   WBC 6.8 4.0 - 10.5 K/uL   RBC 3.84 (L) 3.87 - 5.11 MIL/uL   Hemoglobin 9.0 (L) 12.0 - 15.0 g/dL   HCT 29.5 (L) 62.1 - 30.8 %   MCV 77.3 (L) 78.0 - 100.0 fL   MCH 23.4 (L) 26.0 - 34.0 pg   MCHC 30.3 30.0 - 36.0 g/dL   RDW 65.7 (H) 84.6 - 96.2 %   Platelets 239 150 - 400 K/uL   Neutrophils Relative % 66 %   Neutro Abs 4.5 1.7 - 7.7 K/uL   Lymphocytes Relative 22 %   Lymphs Abs 1.5 0.7 - 4.0 K/uL   Monocytes Relative 8 %   Monocytes Absolute 0.5 0.1 - 1.0 K/uL   Eosinophils Relative 3 %   Eosinophils Absolute 0.2 0.0 - 0.7 K/uL   Basophils Relative 1 %   Basophils Absolute 0.0 0.0 - 0.1 K/uL  Magnesium  Result Value Ref Range   Magnesium 1.1 (L) 1.7 - 2.4 mg/dL   Ct Abdomen Pelvis Wo Contrast Result Date: 10/18/2017 CLINICAL DATA:  Unspecified abdominal pain with diarrhea with nausea and vomiting. Foul smelling urine EXAM: CT ABDOMEN AND PELVIS WITHOUT CONTRAST TECHNIQUE: Multidetector CT imaging of the abdomen and pelvis was performed following the standard protocol without IV contrast. COMPARISON:  02/02/2015 FINDINGS: Lower chest:  Cardiomegaly and biventricular pacer. Coronary atherosclerotic calcification. Mild scarring or atelectasis in the lower lobes. Hepatobiliary: No focal liver abnormality.Cholecystectomy with chronic common bile duct dilatation, presumably reservoir effect given stability. Common bile duct measures up to 1  cm at the midportion. Closely neighboring calcification of the pancreatic head is atherosclerotic. Pancreas: Unremarkable. Spleen: Unremarkable. Adrenals/Urinary Tract: Negative adrenals. No hydronephrosis or stone. Calcifications along the upper left ureter have the appearance of phleboliths. Negative bladder where not obscured by streak artifact from hip prosthesis. Stomach/Bowel: Fluid levels in the distal colon correlating with history of diarrhea. Distal colonic diverticulosis. No evidence of inflammatory bowel wall thickening. No bowel obstruction. The stomach is moderately distended. No indication of chronic vomiting. Vascular/Lymphatic: Diffuse atherosclerotic calcification of the aorta and its branches. No acute vascular finding. Negative for mass or adenopathy. Reproductive:Hysterectomy.  Possible oophorectomies. Other: Small fatty umbilical hernia that was also seen in 2016. Probable right inguinal hernia repair. Musculoskeletal: Bilateral hip replacement. There is a chronic cystic density mass along the lower right iliacus measuring up to 3.5 cm. L3-4 and L4-5 discectomy and solid arthrodesis. Advanced super adjacent lumbar disc degeneration. Osteopenia. IMPRESSION: 1. Colonic fluid levels correlating with history of diarrhea. No bowel wall thickening or obstruction. 2.  Aortic Atherosclerosis (ICD10-I70.0).  Coronary atherosclerosis. 3. 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 Bilateral hip replacement. 4. Chronic fatty umbilical hernia. Electronically Signed   By: Marnee SpringJonathon  Watts M.D.   On: 10/18/2017 10:23   Dg Chest 2 View Result Date: 10/18/2017 CLINICAL  DATA:  Abdominal pain, nausea, vomiting, diarrhea and generalized weakness. EXAM: CHEST  2 VIEW COMPARISON:  09/28/2017. FINDINGS: Trachea is midline. Heart is enlarged, stable. Thoracic aorta is calcified. Pacemaker lead tips are in the right atrium and right ventricle. Lungs are somewhat low in volume but are grossly clear. No pleural fluid. IMPRESSION: No acute findings. Electronically Signed   By: Leanna BattlesMelinda  Blietz M.D.   On: 10/18/2017 10:12     1240:  QT prolonged on EKG; magnesium low so IV magnesium given.  +UTI, UC pending; will tx with IV rocephin. BUN/Cr elevated from baseline; judicious IVF given.  Pt has tol PO well without N/V. Pt ambulated with walker; pt was walking into objects, walls, needed heavy re-direction by ED staff. Pt's family states this is not pt's baseline and pt needs to stay alone during the day at home while others in the house are out at work. Pt clearly unsafe and cannot take care of herself/be alone. T/C to [facility] Ardyth HarpsHernandez, case discussed, including:  HPI, pertinent PM/SHx, VS/PE, dx testing, ED course and treatment:  Agreeable to admit.    Final Clinical Impressions(s) / ED Diagnoses   Final diagnoses:  None    ED Discharge Orders    None       Samuel JesterMcManus, Quinlyn Tep, DO 10/20/17 1413

## 2017-10-18 NOTE — ED Triage Notes (Signed)
PT was [facility] [place] yesterday to home and started having middle abdominal discomfort with n/v/d last night. Daughter also reports generalized weakness and periods of confusion with foul smelling urine as well with new incontinence.

## 2017-10-18 NOTE — H&P (Signed)
History and Physical    [name] WJX:[id number] DOB: [birth year] DOA: 10/18/2017  Referring MD/NP/PA: Samuel Jester, EDP PCP: Kari Baars, MD  Patient coming from: Home  Chief Complaint: "I lost my sweets", per EDP patient has been confused and with urinary frequency  HPI: [name] is a 76 y.o. female with history of stage III chronic kidney disease, history of AV block status post pacemaker, history of A. fib anticoagulated on Eliquis, GERD, hypertension, hyperlipidemia, morbid obesity who was hospitalized in mid October and discharged to SNF, was just discharged from SNF yesterday.  Daughter states that she has been increasingly confused over the past 24 hours, urinating all over the bed which is not usual for her, urine has been very malodorous.  Daughter works and is concerned about leaving her mother at home all day.  She brings her into the hospital for evaluation.  In the ED vital signs are stable, she is noticed to have significant encephalopathy and confusion, labs are unremarkable with the exception of a creatinine of 1.58, was 1.05 about 3 weeks ago, hemoglobin is 9.0.  Urine analysis is indicative of a UTI and admission is requested.  Past Medical/Surgical History: Past Medical History:  Diagnosis Date  . Anemia   . Anginal pain (HCC)    "all the time, but not heart related"  . Arthritis    "legs and back" (09/30/2013)  . Asthma    years ago  . 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 Blurred vision, bilateral    sometimes double vision  . CAD (coronary artery disease)    multivessel, CABG 6/10.  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  Myoview (11/20/11) showed no ischemia & EF 62%  . Chronic lower back pain   . Confusion   . COPD (chronic obstructive pulmonary disease) (HCC)    severe lung disease by PFTs 6/12  . DDD (degenerative disc disease)   . Deep vein thrombosis, upper right extremity (HCC)    post-PPM  . Dehiscence of  closure of sternum or sternotomy 07/2009   sternal infection; required flap closure  . Diabetes mellitus, type II (HCC)   . Gastroesophageal reflux disease    not much  . H/O hiatal hernia   . History of kidney stones   . Hyperlipidemia   . Hypertension   . Migraines    "years ago" (09/30/2013)  . Obesity   . Orthopnea    sometimes  . Pacemaker   . PUD (peptic ulcer disease)   . Seizures (HCC)    "long time ago; don't remember what they were related to" (09/30/2013)none recent  . Sleep apnea    denies CPAP use on 09/30/2013  . 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 Urge incontinence of urine     Past Surgical History:  Procedure Laterality Date  . ABDOMINAL HYSTERECTOMY     partial  . APPENDECTOMY    . BACK SURGERY     x3 total  . CESAREAN SECTION  ~1980  . CHOLECYSTECTOMY    . COLONOSCOPY  04/27/2005   hemorrhoids, diverticula  . CORONARY ARTERY BYPASS GRAFT  05/1999   - LIMA to LAD, SVG to OM, SVG to PDA  . ESOPHAGOGASTRODUODENOSCOPY     with [facility] dilation,  biopsy, and disruption Schatzki's ring  . INSERT / REPLACE / REMOVE PACEMAKER  1993  . JOINT REPLACEMENT    . LUMBAR DISC SURGERY    .  PACEMAKER PLACEMENT  1993   Status post DDD pacemaker implantation in 1993, with Medtronic Kappa generator change in 2003 for  second-degree AV block, most recent gen change by Fawn KirkJA 04/14/11  . POSTERIOR LUMBAR FUSION    . RECONSTRUCTIVE REPAIR STERNAL     for infection s/p CABG 8/10  . REPLACEMENT TOTAL KNEE Bilateral   . THYROIDECTOMY    . TOTAL HIP ARTHROPLASTY Right   . TUBAL LIGATION    . WRIST SURGERY Left    "tumor taken off" (09/30/2013)    Social History:  reports that  has never smoked. She has quit using smokeless tobacco. Her smokeless tobacco use included snuff. She reports that she does not drink alcohol or use drugs.  Allergies: Allergies  Allergen Reactions  . Codeine Other (See Comments)    Reaction:  Headaches   . Food Nausea And  Vomiting and Other (See Comments)    Pt is allergic to leafy raw vegetables and seeds.    . Levaquin [Levofloxacin] Other (See Comments)    Confusion, altered mental status, inability to walk  . Oxycodone Hcl Other (See Comments)    Reaction:  Headaches   . Peanut-Containing Drug Products Nausea And Vomiting  . Reglan [Metoclopramide] Other (See Comments)    Reaction:  Confusion     Family History:  Family History  Problem Relation Age of Onset  . Heart disease Mother        MI  . Colon cancer Father        age 78  . Diabetes Unknown   . Coronary artery disease Unknown        Female <55  . Cancer Unknown     Prior to Admission medications   Medication Sig Start Date End Date Taking? Authorizing Provider  acetaminophen (TYLENOL) 325 MG tablet Take 325-650 mg by mouth every 6 (six) hours as needed for mild pain, moderate pain or headache.    Yes [provider]  apixaban (ELIQUIS) 5 MG TABS tablet Take 5 mg by mouth 2 (two) times daily.    Yes [provider]  aspirin EC 81 MG tablet Take 81 mg by mouth daily.   Yes [provider]  atorvastatin (LIPITOR) 40 MG tablet Take 40 mg by mouth at bedtime.   Yes [provider]  benazepril (LOTENSIN) 40 MG tablet Take 40 mg by mouth daily.    Yes [provider]  ferrous sulfate 325 (65 FE) MG tablet Take 325 mg by mouth daily with breakfast.   Yes [provider]  furosemide (LASIX) 40 MG tablet Take 40 mg by mouth daily as needed for fluid.   Yes [provider]  gabapentin (NEURONTIN) 100 MG capsule Take 200 mg by mouth at bedtime.   Yes [provider]  ibuprofen (ADVIL,MOTRIN) 200 MG tablet Take 600 mg by mouth every 6 (six) hours as needed for headache, mild pain or moderate pain.   Yes [provider]  levETIRAcetam (KEPPRA) 500 MG tablet Take 500 mg by mouth 2 (two) times daily.   Yes [provider]  LORazepam (ATIVAN) 0.5 MG tablet Take 1  tablet (0.5 mg total) by mouth every 8 (eight) hours as needed for anxiety. 10/01/17 10/01/18 Yes Kari BaarsHawkins, Edward, MD  lubiprostone (AMITIZA) 24 MCG capsule Take 24 mcg by mouth 2 (two) times daily with a meal.   Yes [provider]  Melatonin 3 MG TABS Take 3 mg by mouth at bedtime as needed (for sleep).  Yes [provider]  metFORMIN (GLUCOPHAGE) 500 MG tablet Take 500 mg by mouth 2 (two) times daily with a meal.    Yes [provider]  metoprolol tartrate (LOPRESSOR) 25 MG tablet Take 12.5 mg by mouth 2 (two) times daily.    Yes [provider]  nitroGLYCERIN (NITROSTAT) 0.4 MG SL tablet Place 1 tablet (0.4 mg total) under the tongue every 5 (five) minutes as needed for chest pain. 05/20/14  Yes BranchDorothe Pea, MD  omega-3 acid ethyl esters (LOVAZA) 1 G capsule Take 2 g by mouth 2 (two) times daily.    Yes [provider]  ondansetron (ZOFRAN-ODT) 4 MG disintegrating tablet Take 4 mg by mouth every 8 (eight) hours as needed for nausea or vomiting.   Yes [provider]  pantoprazole (PROTONIX) 40 MG tablet Take 1 tablet (40 mg total) by mouth daily. 12/03/13  Yes Gelene Mink, NP  potassium chloride (K-DUR,KLOR-CON) 10 MEQ tablet Take 10 mEq by mouth daily.   Yes [provider]  senna-docusate (SENEXON-S) 8.6-50 MG tablet Take 1 tablet by mouth 2 (two) times daily.   Yes [provider]  traZODone (DESYREL) 50 MG tablet Take 50 mg by mouth at bedtime.   Yes [provider]    Review of Systems:  Unable to obtain given confusion  Physical Exam: Vitals:   10/18/17 1330 10/18/17 1352 10/18/17 1416 10/18/17 1511  BP:  106/60 116/60   Pulse: 60 60 60   Resp: 15 (!) 22 18   Temp:   97.9 F (36.6 C)   TempSrc:   Oral   SpO2: 95% 95%  97%  Weight:   86.6 kg (191 lb)   Height:   5\' 2"  (1.575 m)      Constitutional: NAD, calm, comfortable, confused, morbidly obese Eyes: PERRL, lids and conjunctivae  normal ENMT: Mucous membranes are moist. Posterior pharynx clear of any exudate or lesions.Normal dentition.  Neck: normal, supple, no masses, no thyromegaly Respiratory: clear to auscultation bilaterally, no wheezing, no crackles. Normal respiratory effort. No accessory muscle use.  Cardiovascular: Regular rate and rhythm, no murmurs / rubs / gallops. No extremity edema. 2+ pedal pulses. No carotid bruits.  Abdomen: no tenderness, no masses palpated. No hepatosplenomegaly. Bowel sounds positive.  Musculoskeletal: no clubbing / cyanosis. No joint deformity upper and lower extremities. Good ROM, no contractures. Normal muscle tone.  Skin: no rashes, lesions, ulcers. No induration Neurologic: Unable to fully assess given current mental state Psychiatric: N unable to fully assess given current mental state   Labs on Admission: I have personally reviewed the following labs and imaging studies  CBC: Recent Labs  Lab 10/18/17 0922  WBC 6.8  NEUTROABS 4.5  HGB 9.0*  HCT 29.7*  MCV 77.3*  PLT 239   Basic Metabolic Panel: Recent Labs  Lab 10/18/17 0922  NA 139  K 3.7  CL 109  CO2 22  GLUCOSE 105*  BUN 21*  CREATININE 1.58*  CALCIUM 9.2  MG 1.1*   GFR: Estimated Creatinine Clearance: 30 mL/min (A) (by C-G formula based on SCr of 1.58 mg/dL (H)). Liver Function Tests: Recent Labs  Lab 10/18/17 0922  AST 22  ALT 13*  ALKPHOS 62  BILITOT 0.4  PROT 6.7  ALBUMIN 3.2*   Recent Labs  Lab 10/18/17 0922  LIPASE 54*   No results for input(s): AMMONIA in the last 168 hours. Coagulation Profile: No results for input(s): INR, PROTIME in the last 168 hours. Cardiac  Enzymes: Recent Labs  Lab 10/18/17 0922  TROPONINI <0.03   BNP (last 3 results) No results for input(s): PROBNP in the last 8760 hours. HbA1C: No results for input(s): HGBA1C in the last 72 hours. CBG: No results for input(s): GLUCAP in the last 168 hours. Lipid Profile: No results for input(s): CHOL, HDL,  LDLCALC, TRIG, CHOLHDL, LDLDIRECT in the last 72 hours. Thyroid Function Tests: No results for input(s): TSH, T4TOTAL, FREET4, T3FREE, THYROIDAB in the last 72 hours. Anemia Panel: No results for input(s): VITAMINB12, FOLATE, FERRITIN, TIBC, IRON, RETICCTPCT in the last 72 hours. Urine analysis:    Component Value Date/Time   COLORURINE YELLOW 10/18/2017 0821   APPEARANCEUR CLOUDY (A) 10/18/2017 0821   LABSPEC 1.014 10/18/2017 0821   PHURINE 5.0 10/18/2017 0821   GLUCOSEU NEGATIVE 10/18/2017 0821   HGBUR MODERATE (A) 10/18/2017 0821   BILIRUBINUR NEGATIVE 10/18/2017 0821   KETONESUR NEGATIVE 10/18/2017 0821   PROTEINUR 30 (A) 10/18/2017 0821   UROBILINOGEN 0.2 02/12/2015 0951   NITRITE NEGATIVE 10/18/2017 0821   LEUKOCYTESUR LARGE (A) 10/18/2017 0821   Sepsis Labs: @LABRCNTIP (procalcitonin:4,lacticidven:4) )No results found for this or any previous visit (from the past 240 hour(s)).   Radiological Exams on Admission: Ct Abdomen Pelvis Wo Contrast  Result Date: 10/18/2017 CLINICAL DATA:  Unspecified abdominal pain with diarrhea with nausea and vomiting. Foul smelling urine EXAM: CT ABDOMEN AND PELVIS WITHOUT CONTRAST TECHNIQUE: Multidetector CT imaging of the abdomen and pelvis was performed following the standard protocol without IV contrast. COMPARISON:  02/02/2015 FINDINGS: Lower chest: Cardiomegaly and biventricular pacer. Coronary atherosclerotic calcification. Mild scarring or atelectasis in the lower lobes. Hepatobiliary: No focal liver abnormality.Cholecystectomy with chronic common bile duct dilatation, presumably reservoir effect given stability. Common bile duct measures up to 1 cm at the midportion. Closely neighboring calcification of the pancreatic head is atherosclerotic. Pancreas: Unremarkable. Spleen: Unremarkable. Adrenals/Urinary Tract: Negative adrenals. No hydronephrosis or stone. Calcifications along the upper left ureter have the appearance of phleboliths. Negative  bladder where not obscured by streak artifact from hip prosthesis. Stomach/Bowel: Fluid levels in the distal colon correlating with history of diarrhea. Distal colonic diverticulosis. No evidence of inflammatory bowel wall thickening. No bowel obstruction. The stomach is moderately distended. No indication of chronic vomiting. Vascular/Lymphatic: Diffuse atherosclerotic calcification of the aorta and its branches. No acute vascular finding. Negative for mass or adenopathy. Reproductive:Hysterectomy.  Possible oophorectomies. Other: Small fatty umbilical hernia that was also seen in 2016. Probable right inguinal hernia repair. Musculoskeletal: Bilateral hip replacement. There is a chronic cystic density mass along the lower right iliacus measuring up to 3.5 cm. L3-4 and L4-5 discectomy and solid arthrodesis. Advanced super adjacent lumbar disc degeneration. Osteopenia. IMPRESSION: 1. Colonic fluid levels correlating with history of diarrhea. No bowel wall thickening or obstruction. 2.  Aortic Atherosclerosis (ICD10-I70.0).  Coronary atherosclerosis. 3. 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 Bilateral hip replacement. 4. Chronic fatty umbilical hernia. Electronically Signed   By: Marnee SpringJonathon  Watts M.D.   On: 10/18/2017 10:23   Dg Chest 2 View  Result Date: 10/18/2017 CLINICAL DATA:  Abdominal pain, nausea, vomiting, diarrhea and generalized weakness. EXAM: CHEST  2 VIEW COMPARISON:  09/28/2017. FINDINGS: Trachea is midline. Heart is enlarged, stable. Thoracic aorta is calcified. Pacemaker lead tips are in the right atrium and right ventricle. Lungs are somewhat low in volume but are grossly clear. No pleural fluid. IMPRESSION: No acute findings. Electronically Signed   By: Leanna BattlesMelinda  Blietz M.D.   On: 10/18/2017  10:12    EKG: Independently reviewed.  A. fib/flutter with ventricular paced beats  Assessment/Plan Principal Problem:   UTI (urinary tract infection) Active  Problems:   Chronic diastolic heart failure (HCC)   Diabetes mellitus, type II (HCC)   Hypertension   Gastroesophageal reflux disease   Hyperlipidemia   Chronic kidney disease (CKD), stage III (moderate) (HCC)   Atrial fibrillation (HCC)   AKI (acute kidney injury) (HCC)    Acute metabolic encephalopathy -Presumably due to UTI. -Unclear if history of baseline dementia.    UTI -Continue Rocephin pending culture data.  Chronic diastolic heart failure -Appears compensated at present.  Atrial fibrillation -Rate controlled, has pacemaker, anticoagulated.  GERD -Continue PPI  Acute on chronic kidney disease stage III -We will give IV fluids for 12 hours, recheck renal function in a.m.    Dr. Juanetta Gosling will assume care in a.m.    DVT prophylaxis: Eliquis Code Status: Full code Family Communication: Patient only Disposition Plan: May need SNF pending PT evaluation Consults called: None Admission status: Inpatient   Time Spent: 85 minutes  Chaya Jan MD [facility] Pager [phone]  If 7PM-7AM, please contact night-coverage [email or web address] Password [email or web address]  10/18/2017, 4:55 PM

## 2017-10-19 LAB — CBC
HCT: 28.6 % — ABNORMAL LOW (ref 36.0–46.0)
HEMOGLOBIN: 8.6 g/dL — AB (ref 12.0–15.0)
MCH: 23.8 pg — AB (ref 26.0–34.0)
MCHC: 30.1 g/dL (ref 30.0–36.0)
MCV: 79 fL (ref 78.0–100.0)
Platelets: 197 10*3/uL (ref 150–400)
RBC: 3.62 MIL/uL — ABNORMAL LOW (ref 3.87–5.11)
RDW: 20.4 % — AB (ref 11.5–15.5)
WBC: 7.7 10*3/uL (ref 4.0–10.5)

## 2017-10-19 LAB — BASIC METABOLIC PANEL
ANION GAP: 7 (ref 5–15)
BUN: 21 mg/dL — ABNORMAL HIGH (ref 6–20)
CALCIUM: 8.7 mg/dL — AB (ref 8.9–10.3)
CHLORIDE: 111 mmol/L (ref 101–111)
CO2: 21 mmol/L — AB (ref 22–32)
CREATININE: 1.76 mg/dL — AB (ref 0.44–1.00)
GFR calc Af Amer: 31 mL/min — ABNORMAL LOW (ref >60)
GFR calc non Af Amer: 27 mL/min — ABNORMAL LOW (ref >60)
GLUCOSE: 102 mg/dL — AB (ref 65–99)
Potassium: 3.9 mmol/L (ref 3.5–5.1)
Sodium: 139 mmol/L (ref 135–145)

## 2017-10-19 MED ORDER — SODIUM CHLORIDE 0.9 % IV SOLN
INTRAVENOUS | Status: DC
  Administered 2017-10-19 – 2017-10-23 (×4): via INTRAVENOUS

## 2017-10-19 NOTE — Plan of Care (Signed)
  Acute Rehab PT Goals(only PT should resolve) Pt Will Go Supine/Side To Sit 10/19/2017 1417 - Progressing by Ocie BobWatkins, Colleen Donahoe, PT Flowsheets Taken 10/19/2017 1417  Pt will go Supine/Side to Sit with supervision Pt Will Go Sit To Supine/Side 10/19/2017 1417 - Progressing by Ocie BobWatkins, [name], PT Flowsheets Taken 10/19/2017 1417  Pt will go Sit to Supine/Side with supervision Patient Will Transfer Sit To/From Stand 10/19/2017 1417 - Progressing by Ocie BobWatkins, [name], PT Flowsheets Taken 10/19/2017 1417  Patient will transfer sit to/from stand with min guard assist Pt Will Transfer Bed To Chair/Chair To Bed 10/19/2017 1417 - Progressing by Ocie BobWatkins, [name], PT Flowsheets Taken 10/19/2017 1417  Pt will Transfer Bed to Chair/Chair to Bed with supervision Pt Will Ambulate 10/19/2017 1417 - Progressing by Ocie BobWatkins, [name], PT Flowsheets Taken 10/19/2017 1417  Pt will Ambulate 50 feet; with supervision;with rolling walker  2:19 PM, 10/19/17 Ocie BobJames Bridney Wolf, MPT Physical Therapist with [facility] 336 [phone] office [phone] mobile phone

## 2017-10-19 NOTE — Progress Notes (Signed)
Subjective: She was admitted yesterday with urinary tract infection and metabolic encephalopathy.  She is better this morning as far as her thinking is concerned but she is still mildly confused.  She has multiple other medical problems.  She has acute kidney injury as well and her renal function is a little bit worse this morning despite IV fluids.  Objective: Vital signs in last 24 hours: Temp:  [97.9 F (36.6 C)-98.2 F (36.8 C)] 98.2 F (36.8 C) (11/15 0645) Pulse Rate:  [52-74] 74 (11/15 0645) Resp:  [14-25] 16 (11/15 0645) BP: (100-142)/(55-93) 124/64 (11/15 0645) SpO2:  [95 %-100 %] 97 % (11/15 0645) Weight:  [86.6 kg (191 lb)] 86.6 kg (191 lb) (11/14 1416) Weight change:  Last BM Date: 10/17/17  Intake/Output from previous day: 11/14 0701 - 11/15 0700 In: 1100 [P.O.:100; I.V.:900; IV Piggyback:100] Out: 751 [Urine:751]  PHYSICAL EXAM General appearance: alert, cooperative and Mildly confused Resp: clear to auscultation bilaterally Cardio: regular rate and rhythm, S1, S2 normal, no murmur, click, rub or gallop GI: soft, non-tender; bowel sounds normal; no masses,  no organomegaly Extremities: extremities normal, atraumatic, no cyanosis or edema Skin warm and dry  Lab Results:  Results for orders placed or performed during the hospital encounter of 10/18/17 (from the past 48 hour(s))  Urinalysis, Routine w reflex microscopic     Status: Abnormal   Collection Time: 10/18/17  8:21 AM  Result Value Ref Range   Color, Urine YELLOW YELLOW   APPearance CLOUDY (A) CLEAR   Specific Gravity, Urine 1.014 1.005 - 1.030   pH 5.0 5.0 - 8.0   Glucose, UA NEGATIVE NEGATIVE mg/dL   Hgb urine dipstick MODERATE (A) NEGATIVE   Bilirubin Urine NEGATIVE NEGATIVE   Ketones, ur NEGATIVE NEGATIVE mg/dL   Protein, ur 30 (A) NEGATIVE mg/dL   Nitrite NEGATIVE NEGATIVE   Leukocytes, UA LARGE (A) NEGATIVE   RBC / HPF 0-5 0 - 5 RBC/hpf   WBC, UA TOO NUMEROUS TO COUNT 0 - 5 WBC/hpf    Bacteria, UA MANY (A) NONE SEEN   Squamous Epithelial / LPF 0-5 (A) NONE SEEN   WBC Clumps PRESENT    Mucus PRESENT    Budding Yeast PRESENT    Hyaline Casts, UA PRESENT   Lipase, blood     Status: Abnormal   Collection Time: 10/18/17  9:22 AM  Result Value Ref Range   Lipase 54 (H) 11 - 51 U/L  Comprehensive metabolic panel     Status: Abnormal   Collection Time: 10/18/17  9:22 AM  Result Value Ref Range   Sodium 139 135 - 145 mmol/L   Potassium 3.7 3.5 - 5.1 mmol/L   Chloride 109 101 - 111 mmol/L   CO2 22 22 - 32 mmol/L   Glucose, Bld 105 (H) 65 - 99 mg/dL   BUN 21 (H) 6 - 20 mg/dL   Creatinine, Ser 1.58 (H) 0.44 - 1.00 mg/dL   Calcium 9.2 8.9 - 10.3 mg/dL   Total Protein 6.7 6.5 - 8.1 g/dL   Albumin 3.2 (L) 3.5 - 5.0 g/dL   AST 22 15 - 41 U/L   ALT 13 (L) 14 - 54 U/L   Alkaline Phosphatase 62 38 - 126 U/L   Total Bilirubin 0.4 0.3 - 1.2 mg/dL   GFR calc non Af Amer 30 (L) >60 mL/min   GFR calc Af Amer 35 (L) >60 mL/min    Comment: (NOTE) The eGFR has been calculated using the CKD EPI equation. This  calculation has not been validated in all clinical situations. eGFR's persistently <60 mL/min signify possible Chronic Kidney Disease.    Anion gap 8 5 - 15  Troponin I     Status: None   Collection Time: 10/18/17  9:22 AM  Result Value Ref Range   Troponin I <0.03 <0.03 ng/mL  Lactic acid, plasma     Status: None   Collection Time: 10/18/17  9:22 AM  Result Value Ref Range   Lactic Acid, Venous 1.2 0.5 - 1.9 mmol/L  CBC with Differential     Status: Abnormal   Collection Time: 10/18/17  9:22 AM  Result Value Ref Range   WBC 6.8 4.0 - 10.5 K/uL   RBC 3.84 (L) 3.87 - 5.11 MIL/uL   Hemoglobin 9.0 (L) 12.0 - 15.0 g/dL   HCT 29.7 (L) 36.0 - 46.0 %   MCV 77.3 (L) 78.0 - 100.0 fL   MCH 23.4 (L) 26.0 - 34.0 pg   MCHC 30.3 30.0 - 36.0 g/dL   RDW 20.1 (H) 11.5 - 15.5 %   Platelets 239 150 - 400 K/uL   Neutrophils Relative % 66 %   Neutro Abs 4.5 1.7 - 7.7 K/uL    Lymphocytes Relative 22 %   Lymphs Abs 1.5 0.7 - 4.0 K/uL   Monocytes Relative 8 %   Monocytes Absolute 0.5 0.1 - 1.0 K/uL   Eosinophils Relative 3 %   Eosinophils Absolute 0.2 0.0 - 0.7 K/uL   Basophils Relative 1 %   Basophils Absolute 0.0 0.0 - 0.1 K/uL  Magnesium     Status: Abnormal   Collection Time: 10/18/17  9:22 AM  Result Value Ref Range   Magnesium 1.1 (L) 1.7 - 2.4 mg/dL  Lactic acid, plasma     Status: None   Collection Time: 10/18/17 11:05 AM  Result Value Ref Range   Lactic Acid, Venous 0.9 0.5 - 1.9 mmol/L  Basic metabolic panel     Status: Abnormal   Collection Time: 10/19/17  5:57 AM  Result Value Ref Range   Sodium 139 135 - 145 mmol/L   Potassium 3.9 3.5 - 5.1 mmol/L   Chloride 111 101 - 111 mmol/L   CO2 21 (L) 22 - 32 mmol/L   Glucose, Bld 102 (H) 65 - 99 mg/dL   BUN 21 (H) 6 - 20 mg/dL   Creatinine, Ser 1.76 (H) 0.44 - 1.00 mg/dL   Calcium 8.7 (L) 8.9 - 10.3 mg/dL   GFR calc non Af Amer 27 (L) >60 mL/min   GFR calc Af Amer 31 (L) >60 mL/min    Comment: (NOTE) The eGFR has been calculated using the CKD EPI equation. This calculation has not been validated in all clinical situations. eGFR's persistently <60 mL/min signify possible Chronic Kidney Disease.    Anion gap 7 5 - 15  CBC     Status: Abnormal   Collection Time: 10/19/17  5:57 AM  Result Value Ref Range   WBC 7.7 4.0 - 10.5 K/uL   RBC 3.62 (L) 3.87 - 5.11 MIL/uL   Hemoglobin 8.6 (L) 12.0 - 15.0 g/dL   HCT 28.6 (L) 36.0 - 46.0 %   MCV 79.0 78.0 - 100.0 fL   MCH 23.8 (L) 26.0 - 34.0 pg   MCHC 30.1 30.0 - 36.0 g/dL   RDW 20.4 (H) 11.5 - 15.5 %   Platelets 197 150 - 400 K/uL    Comment: SPECIMEN CHECKED FOR CLOTS PLATELET COUNT CONFIRMED BY SMEAR GIANT PLATELETS  SEEN     ABGS No results for input(s): PHART, PO2ART, TCO2, HCO3 in the last 72 hours.  Invalid input(s): PCO2 CULTURES No results found for this or any previous visit (from the past 240 hour(s)). Studies/Results: Ct Abdomen  Pelvis Wo Contrast  Result Date: 10/18/2017 CLINICAL DATA:  Unspecified abdominal pain with diarrhea with nausea and vomiting. Foul smelling urine EXAM: CT ABDOMEN AND PELVIS WITHOUT CONTRAST TECHNIQUE: Multidetector CT imaging of the abdomen and pelvis was performed following the standard protocol without IV contrast. COMPARISON:  02/02/2015 FINDINGS: Lower chest: Cardiomegaly and biventricular pacer. Coronary atherosclerotic calcification. Mild scarring or atelectasis in the lower lobes. Hepatobiliary: No focal liver abnormality.Cholecystectomy with chronic common bile duct dilatation, presumably reservoir effect given stability. Common bile duct measures up to 1 cm at the midportion. Closely neighboring calcification of the pancreatic head is atherosclerotic. Pancreas: Unremarkable. Spleen: Unremarkable. Adrenals/Urinary Tract: Negative adrenals. No hydronephrosis or stone. Calcifications along the upper left ureter have the appearance of phleboliths. Negative bladder where not obscured by streak artifact from hip prosthesis. Stomach/Bowel: Fluid levels in the distal colon correlating with history of diarrhea. Distal colonic diverticulosis. No evidence of inflammatory bowel wall thickening. No bowel obstruction. The stomach is moderately distended. No indication of chronic vomiting. Vascular/Lymphatic: Diffuse atherosclerotic calcification of the aorta and its branches. No acute vascular finding. Negative for mass or adenopathy. Reproductive:Hysterectomy.  Possible oophorectomies. Other: Small fatty umbilical hernia that was also seen in 2016. Probable right inguinal hernia repair. Musculoskeletal: Bilateral hip replacement. There is a chronic cystic density mass along the lower right iliacus measuring up to 3.5 cm. L3-4 and L4-5 discectomy and solid arthrodesis. Advanced super adjacent lumbar disc degeneration. Osteopenia. IMPRESSION: 1. Colonic fluid levels correlating with history of diarrhea. No bowel wall  thickening or obstruction. 2.  Aortic Atherosclerosis (ICD10-I70.0).  Coronary atherosclerosis. 3. 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 Bilateral hip replacement. 4. Chronic fatty umbilical hernia. Electronically Signed   By: Monte Fantasia M.D.   On: 10/18/2017 10:23   Dg Chest 2 View  Result Date: 10/18/2017 CLINICAL DATA:  Abdominal pain, nausea, vomiting, diarrhea and generalized weakness. EXAM: CHEST  2 VIEW COMPARISON:  09/28/2017. FINDINGS: Trachea is midline. Heart is enlarged, stable. Thoracic aorta is calcified. Pacemaker lead tips are in the right atrium and right ventricle. Lungs are somewhat low in volume but are grossly clear. No pleural fluid. IMPRESSION: No acute findings. Electronically Signed   By: Lorin Picket M.D.   On: 10/18/2017 10:12    Medications:  Prior to Admission:  Medications Prior to Admission  Medication Sig Dispense Refill Last Dose  . acetaminophen (TYLENOL) 325 MG tablet Take 325-650 mg by mouth every 6 (six) hours as needed for mild pain, moderate pain or headache.    10/18/2017 at Unknown time  . apixaban (ELIQUIS) 5 MG TABS tablet Take 5 mg by mouth 2 (two) times daily.    10/18/2017 at 600  . aspirin EC 81 MG tablet Take 81 mg by mouth daily.   10/18/2017 at 600  . atorvastatin (LIPITOR) 40 MG tablet Take 40 mg by mouth at bedtime.   10/17/2017 at Unknown time  . benazepril (LOTENSIN) 40 MG tablet Take 40 mg by mouth daily.    10/18/2017 at Unknown time  . ferrous sulfate 325 (65 FE) MG tablet Take 325 mg by mouth daily with breakfast.   10/18/2017 at Unknown time  . furosemide (LASIX) 40 MG tablet Take 40 mg by mouth  daily as needed for fluid.   10/18/2017 at Unknown time  . gabapentin (NEURONTIN) 100 MG capsule Take 200 mg by mouth at bedtime.   10/17/2017 at Unknown time  . ibuprofen (ADVIL,MOTRIN) 200 MG tablet Take 600 mg by mouth every 6 (six) hours as needed for headache, mild pain or moderate pain.    Past Month at Unknown time  . levETIRAcetam (KEPPRA) 500 MG tablet Take 500 mg by mouth 2 (two) times daily.   10/18/2017 at Unknown time  . LORazepam (ATIVAN) 0.5 MG tablet Take 1 tablet (0.5 mg total) by mouth every 8 (eight) hours as needed for anxiety. 30 tablet 0 10/18/2017 at Unknown time  . lubiprostone (AMITIZA) 24 MCG capsule Take 24 mcg by mouth 2 (two) times daily with a meal.   10/18/2017 at Unknown time  . Melatonin 3 MG TABS Take 3 mg by mouth at bedtime as needed (for sleep).   10/17/2017 at Unknown time  . metFORMIN (GLUCOPHAGE) 500 MG tablet Take 500 mg by mouth 2 (two) times daily with a meal.    10/18/2017 at Unknown time  . metoprolol tartrate (LOPRESSOR) 25 MG tablet Take 12.5 mg by mouth 2 (two) times daily.    10/18/2017 at 600  . nitroGLYCERIN (NITROSTAT) 0.4 MG SL tablet Place 1 tablet (0.4 mg total) under the tongue every 5 (five) minutes as needed for chest pain. 25 tablet 1 unknown  . omega-3 acid ethyl esters (LOVAZA) 1 G capsule Take 2 g by mouth 2 (two) times daily.    10/18/2017 at Unknown time  . ondansetron (ZOFRAN-ODT) 4 MG disintegrating tablet Take 4 mg by mouth every 8 (eight) hours as needed for nausea or vomiting.   Past Month at Unknown time  . pantoprazole (PROTONIX) 40 MG tablet Take 1 tablet (40 mg total) by mouth daily. 30 tablet 3 10/18/2017 at Unknown time  . potassium chloride (K-DUR,KLOR-CON) 10 MEQ tablet Take 10 mEq by mouth daily.   10/18/2017 at Unknown time  . senna-docusate (SENEXON-S) 8.6-50 MG tablet Take 1 tablet by mouth 2 (two) times daily.   10/18/2017 at Unknown time  . traZODone (DESYREL) 50 MG tablet Take 50 mg by mouth at bedtime.   10/17/2017 at Unknown time   Scheduled: . apixaban  5 mg Oral BID  . aspirin EC  81 mg Oral Daily  . atorvastatin  40 mg Oral QHS  . ferrous sulfate  325 mg Oral Q breakfast  . gabapentin  200 mg Oral QHS  . levETIRAcetam  500 mg Oral BID  . metoprolol tartrate  12.5 mg Oral BID  . omega-3 acid ethyl  esters  2 g Oral BID  . pantoprazole  40 mg Oral Daily  . senna-docusate  1 tablet Oral BID  . traZODone  50 mg Oral QHS   Continuous: . cefTRIAXone (ROCEPHIN)  IV     LGX:[id number] **OR** acetaminophen, nitroGLYCERIN, ondansetron **OR** ondansetron (ZOFRAN) IV, senna-docusate  Assesment: She was admitted with urinary tract infection.  She has acute kidney injury as well.  She has metabolic encephalopathy which tends to occur when she gets medically sick.  Normally her mental status is good.  I think she may have some very mild baseline dementia that really does not manifest itself until she has some medical illness.  Since her renal function is actually a little bit worse I will continue with IV fluids. Principal Problem:   UTI (urinary tract infection) Active Problems:   Chronic diastolic heart failure ([place])  Diabetes mellitus, type II ([place])   Hypertension   Gastroesophageal reflux disease   Hyperlipidemia   Chronic kidney disease (CKD), stage III (moderate) ([facility])   Atrial fibrillation ([facility])   AKI (acute kidney injury) ([place])    Plan: Continue treatments.  Continue IV fluids and IV antibiotic    LOS: 1 day   Tyland Klemens L 10/19/2017, 8:36 AM

## 2017-10-19 NOTE — Evaluation (Signed)
Physical Therapy Evaluation Patient Details Name: [name] MRN: [id number] DOB: 25-May-1941 Today's Date: 10/19/2017   History of Present Illness  [name] is a 76 y.o. female with history of stage III chronic kidney disease, history of AV block status post pacemaker, history of A. fib anticoagulated on Eliquis, GERD, hypertension, hyperlipidemia, morbid obesity who was hospitalized in mid October and discharged to SNF, was just discharged from SNF yesterday.  Daughter states that she has been increasingly confused over the past 24 hours, urinating all over the bed which is not usual for her, urine has been very malodorous.  Daughter works and is concerned about leaving her mother at home all day.  She brings her into the hospital for evaluation.  In the ED vital signs are stable, she is noticed to have significant encephalopathy and confusion, labs are unremarkable with the exception of a creatinine of 1.58, was 1.05 about 3 weeks ago, hemoglobin is 9.0.  Urine analysis is indicative of a UTI and admission is requested.    Clinical Impression  Patient limited for gait secondary to fatigue, has difficulty with supine to sit due to poor core strength and sit to stands due to BLE weakness.  Patient tolerated sitting up in chair after therapy - RN notified.  Patient will benefit from continued physical therapy in hospital and recommended venue below to increase strength, balance, endurance for safe ADLs and gait.    Follow Up Recommendations Supervision/Assistance - 24 hour;SNF    Equipment Recommendations  None recommended by PT    Recommendations for Other Services       Precautions / Restrictions Precautions Precautions: Fall Restrictions Weight Bearing Restrictions: No      Mobility  Bed Mobility Overal bed mobility: Needs Assistance Bed Mobility: Supine to Sit;Sit to Supine     Supine to sit: Mod assist Sit to supine: Mod assist      Transfers Overall transfer  level: Needs assistance Equipment used: Rolling walker (2 wheeled) Transfers: Sit to/from UGI CorporationStand;Stand Pivot Transfers Sit to Stand: Min assist Stand pivot transfers: Min guard          Ambulation/Gait Ambulation/Gait assistance: Min guard Ambulation Distance (Feet): 30 Feet Assistive device: Rolling walker (2 wheeled) Gait Pattern/deviations: Decreased step length - right;Decreased step length - left;Decreased stride length   Gait velocity interpretation: Below normal speed for age/gender General Gait Details: Patient demonstrates slow labored slightly unsteady cadence without loss of balance, limited secondary to c/o fatigue  Stairs            Wheelchair Mobility    Modified Rankin (Stroke Patients Only)       Balance Overall balance assessment: Needs assistance Sitting-balance support: No upper extremity supported;Feet supported Sitting balance-Leahy Scale: Fair     Standing balance support: Bilateral upper extremity supported;During functional activity Standing balance-Leahy Scale: Fair                               Pertinent Vitals/Pain Pain Assessment: No/denies pain    Home Living Family/patient expects to be discharged to:: Private residence Living Arrangements: Other relatives Available Help at Discharge: Family Type of Home: House Home Access: Level entry     Home Layout: One level Home Equipment: Environmental consultantWalker - 2 wheels;Bedside commode;Wheelchair - manual;Shower seat      Prior Function Level of Independence: Independent with assistive device(s)               Hand  Dominance        Extremity/Trunk Assessment   Upper Extremity Assessment Upper Extremity Assessment: Generalized weakness    Lower Extremity Assessment Lower Extremity Assessment: Generalized weakness    Cervical / Trunk Assessment Cervical / Trunk Assessment: Kyphotic  Communication   Communication: No difficulties  Cognition Arousal/Alertness:  Awake/alert Behavior During Therapy: WFL for tasks assessed/performed Overall Cognitive Status: Within Functional Limits for tasks assessed                                        General Comments      Exercises     Assessment/Plan    PT Assessment Patient needs continued PT services  PT Problem List Decreased strength;Decreased activity tolerance;Decreased balance;Decreased mobility       PT Treatment Interventions Gait training;Functional mobility training;Therapeutic activities;Therapeutic exercise;Patient/family education    PT Goals (Current goals can be found in the Care Plan section)  Acute Rehab PT Goals Patient Stated Goal: return home PT Goal Formulation: With patient Time For Goal Achievement: 10/23/17 Potential to Achieve Goals: Good    Frequency Min 3X/week   Barriers to discharge        Co-evaluation               AM-PAC PT "6 Clicks" Daily Activity  Outcome Measure Difficulty turning over in bed (including adjusting bedclothes, sheets and blankets)?: A Lot Difficulty moving from lying on back to sitting on the side of the bed? : A Lot Difficulty sitting down on and standing up from a chair with arms (e.g., wheelchair, bedside commode, etc,.)?: A Little Help needed moving to and from a bed to chair (including a wheelchair)?: A Little Help needed walking in hospital room?: A Little Help needed climbing 3-5 steps with a railing? : A Lot 6 Click Score: 15    End of Session Equipment Utilized During Treatment: Gait belt Activity Tolerance: Patient limited by fatigue;Patient tolerated treatment well Patient left: in chair;with call bell/phone within reach(RN notified patient left in chair) Nurse Communication: Mobility status PT Visit Diagnosis: Unsteadiness on feet (R26.81);Other abnormalities of gait and mobility (R26.89);Muscle weakness (generalized) (M62.81)    Time: [id number] PT Time Calculation (min) (ACUTE ONLY): 31  min   Charges:   PT Evaluation $PT Eval Moderate Complexity: 1 Mod PT Treatments $Therapeutic Activity: 8-22 mins   PT G Codes:   PT G-Codes **NOT FOR INPATIENT CLASS** Functional Assessment Tool Used: AM-PAC 6 Clicks Basic Mobility Functional Limitation: Mobility: Walking and moving around Mobility: Walking and Moving Around Current Status ([id number]): At least 40 percent but less than 60 percent impaired, limited or restricted Mobility: Walking and Moving Around Goal Status [phone](G8979): At least 40 percent but less than 60 percent impaired, limited or restricted Mobility: Walking and Moving Around Discharge Status [phone]): At least 40 percent but less than 60 percent impaired, limited or restricted    2:16 PM, 10/19/17 Ocie BobJames Adrena Nakamura, MPT Physical Therapist with [facility] [phone] office [phone] mobile phone

## 2017-10-20 LAB — BASIC METABOLIC PANEL
Anion gap: 8 (ref 5–15)
BUN: 23 mg/dL — ABNORMAL HIGH (ref 6–20)
CHLORIDE: 110 mmol/L (ref 101–111)
CO2: 20 mmol/L — AB (ref 22–32)
CREATININE: 1.61 mg/dL — AB (ref 0.44–1.00)
Calcium: 9.3 mg/dL (ref 8.9–10.3)
GFR calc non Af Amer: 30 mL/min — ABNORMAL LOW (ref >60)
GFR, EST AFRICAN AMERICAN: 34 mL/min — AB (ref >60)
GLUCOSE: 125 mg/dL — AB (ref 65–99)
Potassium: 4.1 mmol/L (ref 3.5–5.1)
Sodium: 138 mmol/L (ref 135–145)

## 2017-10-20 LAB — URINE CULTURE

## 2017-10-20 MED ORDER — AMLODIPINE BESYLATE 5 MG PO TABS
5.0000 mg | ORAL_TABLET | Freq: Every day | ORAL | Status: DC
  Administered 2017-10-20 – 2017-10-21 (×2): 5 mg via ORAL
  Filled 2017-10-20 (×2): qty 1

## 2017-10-20 MED ORDER — LORAZEPAM 2 MG/ML IJ SOLN
0.5000 mg | INTRAMUSCULAR | Status: DC | PRN
  Administered 2017-10-20 – 2017-10-22 (×6): 0.5 mg via INTRAVENOUS
  Filled 2017-10-20 (×7): qty 1

## 2017-10-20 NOTE — Progress Notes (Signed)
Subjective: She has had trouble with being confused overnight.  She is still a little confused this morning.  It has been recommended that she go back to skilled care facility.  I discussed all this with her daughter at bedside  Objective: Vital signs in last 24 hours: Temp:  [98 F (36.7 C)-98.7 F (37.1 C)] 98.7 F (37.1 C) (11/16 0821) Pulse Rate:  [75-86] 80 (11/16 0821) Resp:  [18-20] 18 (11/16 0821) BP: (137-152)/(64-85) 152/85 (11/16 0821) SpO2:  [97 %-98 %] 98 % (11/16 0821) Weight change:  Last BM Date: 10/17/17  Intake/Output from previous day: 11/15 0701 - 11/16 0700 In: 1960 [P.O.:1060; I.V.:900] Out: 2650 [Urine:2650]  PHYSICAL EXAM General appearance: alert and Mildly confused Resp: clear to auscultation bilaterally Cardio: regular rate and rhythm, S1, S2 normal, no murmur, click, rub or gallop GI: soft, non-tender; bowel sounds normal; no masses,  no organomegaly Extremities: extremities normal, atraumatic, no cyanosis or edema Throat is clear.  Skin warm and dry  Lab Results:  Results for orders placed or performed during the hospital encounter of 10/18/17 (from the past 48 hour(s))  Lipase, blood     Status: Abnormal   Collection Time: 10/18/17  9:22 AM  Result Value Ref Range   Lipase 54 (H) 11 - 51 U/L  Comprehensive metabolic panel     Status: Abnormal   Collection Time: 10/18/17  9:22 AM  Result Value Ref Range   Sodium 139 135 - 145 mmol/L   Potassium 3.7 3.5 - 5.1 mmol/L   Chloride 109 101 - 111 mmol/L   CO2 22 22 - 32 mmol/L   Glucose, Bld 105 (H) 65 - 99 mg/dL   BUN 21 (H) 6 - 20 mg/dL   Creatinine, Ser 1.58 (H) 0.44 - 1.00 mg/dL   Calcium 9.2 8.9 - 10.3 mg/dL   Total Protein 6.7 6.5 - 8.1 g/dL   Albumin 3.2 (L) 3.5 - 5.0 g/dL   AST 22 15 - 41 U/L   ALT 13 (L) 14 - 54 U/L   Alkaline Phosphatase 62 38 - 126 U/L   Total Bilirubin 0.4 0.3 - 1.2 mg/dL   GFR calc non Af Amer 30 (L) >60 mL/min   GFR calc Af Amer 35 (L) >60 mL/min    Comment:  (NOTE) The eGFR has been calculated using the CKD EPI equation. This calculation has not been validated in all clinical situations. eGFR's persistently <60 mL/min signify possible Chronic Kidney Disease.    Anion gap 8 5 - 15  Troponin I     Status: None   Collection Time: 10/18/17  9:22 AM  Result Value Ref Range   Troponin I <0.03 <0.03 ng/mL  Lactic acid, plasma     Status: None   Collection Time: 10/18/17  9:22 AM  Result Value Ref Range   Lactic Acid, Venous 1.2 0.5 - 1.9 mmol/L  CBC with Differential     Status: Abnormal   Collection Time: 10/18/17  9:22 AM  Result Value Ref Range   WBC 6.8 4.0 - 10.5 K/uL   RBC 3.84 (L) 3.87 - 5.11 MIL/uL   Hemoglobin 9.0 (L) 12.0 - 15.0 g/dL   HCT 29.7 (L) 36.0 - 46.0 %   MCV 77.3 (L) 78.0 - 100.0 fL   MCH 23.4 (L) 26.0 - 34.0 pg   MCHC 30.3 30.0 - 36.0 g/dL   RDW 20.1 (H) 11.5 - 15.5 %   Platelets 239 150 - 400 K/uL   Neutrophils Relative %  66 %   Neutro Abs 4.5 1.7 - 7.7 K/uL   Lymphocytes Relative 22 %   Lymphs Abs 1.5 0.7 - 4.0 K/uL   Monocytes Relative 8 %   Monocytes Absolute 0.5 0.1 - 1.0 K/uL   Eosinophils Relative 3 %   Eosinophils Absolute 0.2 0.0 - 0.7 K/uL   Basophils Relative 1 %   Basophils Absolute 0.0 0.0 - 0.1 K/uL  Magnesium     Status: Abnormal   Collection Time: 10/18/17  9:22 AM  Result Value Ref Range   Magnesium 1.1 (L) 1.7 - 2.4 mg/dL  Lactic acid, plasma     Status: None   Collection Time: 10/18/17 11:05 AM  Result Value Ref Range   Lactic Acid, Venous 0.9 0.5 - 1.9 mmol/L  Basic metabolic panel     Status: Abnormal   Collection Time: 10/19/17  5:57 AM  Result Value Ref Range   Sodium 139 135 - 145 mmol/L   Potassium 3.9 3.5 - 5.1 mmol/L   Chloride 111 101 - 111 mmol/L   CO2 21 (L) 22 - 32 mmol/L   Glucose, Bld 102 (H) 65 - 99 mg/dL   BUN 21 (H) 6 - 20 mg/dL   Creatinine, Ser 1.76 (H) 0.44 - 1.00 mg/dL   Calcium 8.7 (L) 8.9 - 10.3 mg/dL   GFR calc non Af Amer 27 (L) >60 mL/min   GFR calc Af  Amer 31 (L) >60 mL/min    Comment: (NOTE) The eGFR has been calculated using the CKD EPI equation. This calculation has not been validated in all clinical situations. eGFR's persistently <60 mL/min signify possible Chronic Kidney Disease.    Anion gap 7 5 - 15  CBC     Status: Abnormal   Collection Time: 10/19/17  5:57 AM  Result Value Ref Range   WBC 7.7 4.0 - 10.5 K/uL   RBC 3.62 (L) 3.87 - 5.11 MIL/uL   Hemoglobin 8.6 (L) 12.0 - 15.0 g/dL   HCT 28.6 (L) 36.0 - 46.0 %   MCV 79.0 78.0 - 100.0 fL   MCH 23.8 (L) 26.0 - 34.0 pg   MCHC 30.1 30.0 - 36.0 g/dL   RDW 20.4 (H) 11.5 - 15.5 %   Platelets 197 150 - 400 K/uL    Comment: SPECIMEN CHECKED FOR CLOTS PLATELET COUNT CONFIRMED BY SMEAR GIANT PLATELETS SEEN   Basic metabolic panel     Status: Abnormal   Collection Time: 10/20/17  6:24 AM  Result Value Ref Range   Sodium 138 135 - 145 mmol/L   Potassium 4.1 3.5 - 5.1 mmol/L   Chloride 110 101 - 111 mmol/L   CO2 20 (L) 22 - 32 mmol/L   Glucose, Bld 125 (H) 65 - 99 mg/dL   BUN 23 (H) 6 - 20 mg/dL   Creatinine, Ser 1.61 (H) 0.44 - 1.00 mg/dL   Calcium 9.3 8.9 - 10.3 mg/dL   GFR calc non Af Amer 30 (L) >60 mL/min   GFR calc Af Amer 34 (L) >60 mL/min    Comment: (NOTE) The eGFR has been calculated using the CKD EPI equation. This calculation has not been validated in all clinical situations. eGFR's persistently <60 mL/min signify possible Chronic Kidney Disease.    Anion gap 8 5 - 15    ABGS No results for input(s): PHART, PO2ART, TCO2, HCO3 in the last 72 hours.  Invalid input(s): PCO2 CULTURES Recent Results (from the past 240 hour(s))  Urine culture  Status: Abnormal   Collection Time: 10/18/17  8:30 AM  Result Value Ref Range Status   Specimen Description URINE, RANDOM  Final   Special Requests NONE  Final   Culture >=100,000 COLONIES/mL KLEBSIELLA PNEUMONIAE (A)  Final   Report Status 10/20/2017 FINAL  Final   Organism ID, Bacteria KLEBSIELLA PNEUMONIAE (A)   Final      Susceptibility   Klebsiella pneumoniae - MIC*    AMPICILLIN >=32 RESISTANT Resistant     CEFAZOLIN <=4 SENSITIVE Sensitive     CEFTRIAXONE <=1 SENSITIVE Sensitive     CIPROFLOXACIN <=0.25 SENSITIVE Sensitive     GENTAMICIN <=1 SENSITIVE Sensitive     IMIPENEM <=0.25 SENSITIVE Sensitive     NITROFURANTOIN 64 INTERMEDIATE Intermediate     TRIMETH/SULFA <=20 SENSITIVE Sensitive     AMPICILLIN/SULBACTAM 4 SENSITIVE Sensitive     PIP/TAZO <=4 SENSITIVE Sensitive     Extended ESBL NEGATIVE Sensitive     * >=100,000 COLONIES/mL KLEBSIELLA PNEUMONIAE   Studies/Results: Ct Abdomen Pelvis Wo Contrast  Result Date: 10/18/2017 CLINICAL DATA:  Unspecified abdominal pain with diarrhea with nausea and vomiting. Foul smelling urine EXAM: CT ABDOMEN AND PELVIS WITHOUT CONTRAST TECHNIQUE: Multidetector CT imaging of the abdomen and pelvis was performed following the standard protocol without IV contrast. COMPARISON:  02/02/2015 FINDINGS: Lower chest: Cardiomegaly and biventricular pacer. Coronary atherosclerotic calcification. Mild scarring or atelectasis in the lower lobes. Hepatobiliary: No focal liver abnormality.Cholecystectomy with chronic common bile duct dilatation, presumably reservoir effect given stability. Common bile duct measures up to 1 cm at the midportion. Closely neighboring calcification of the pancreatic head is atherosclerotic. Pancreas: Unremarkable. Spleen: Unremarkable. Adrenals/Urinary Tract: Negative adrenals. No hydronephrosis or stone. Calcifications along the upper left ureter have the appearance of phleboliths. Negative bladder where not obscured by streak artifact from hip prosthesis. Stomach/Bowel: Fluid levels in the distal colon correlating with history of diarrhea. Distal colonic diverticulosis. No evidence of inflammatory bowel wall thickening. No bowel obstruction. The stomach is moderately distended. No indication of chronic vomiting. Vascular/Lymphatic: Diffuse  atherosclerotic calcification of the aorta and its branches. No acute vascular finding. Negative for mass or adenopathy. Reproductive:Hysterectomy.  Possible oophorectomies. Other: Small fatty umbilical hernia that was also seen in 2016. Probable right inguinal hernia repair. Musculoskeletal: Bilateral hip replacement. There is a chronic cystic density mass along the lower right iliacus measuring up to 3.5 cm. L3-4 and L4-5 discectomy and solid arthrodesis. Advanced super adjacent lumbar disc degeneration. Osteopenia. IMPRESSION: 1. Colonic fluid levels correlating with history of diarrhea. No bowel wall thickening or obstruction. 2.  Aortic Atherosclerosis (ICD10-I70.0).  Coronary atherosclerosis. 3. 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 Bilateral hip replacement. 4. Chronic fatty umbilical hernia. Electronically Signed   By: Monte Fantasia M.D.   On: 10/18/2017 10:23   Dg Chest 2 View  Result Date: 10/18/2017 CLINICAL DATA:  Abdominal pain, nausea, vomiting, diarrhea and generalized weakness. EXAM: CHEST  2 VIEW COMPARISON:  09/28/2017. FINDINGS: Trachea is midline. Heart is enlarged, stable. Thoracic aorta is calcified. Pacemaker lead tips are in the right atrium and right ventricle. Lungs are somewhat low in volume but are grossly clear. No pleural fluid. IMPRESSION: No acute findings. Electronically Signed   By: Lorin Picket M.D.   On: 10/18/2017 10:12    Medications:  Prior to Admission:  Medications Prior to Admission  Medication Sig Dispense Refill Last Dose  . acetaminophen (TYLENOL) 325 MG tablet Take 325-650 mg by mouth every 6 (six) hours as  needed for mild pain, moderate pain or headache.    10/18/2017 at Unknown time  . apixaban (ELIQUIS) 5 MG TABS tablet Take 5 mg by mouth 2 (two) times daily.    10/18/2017 at 600  . aspirin EC 81 MG tablet Take 81 mg by mouth daily.   10/18/2017 at 600  . atorvastatin (LIPITOR) 40 MG tablet Take 40 mg by  mouth at bedtime.   10/17/2017 at Unknown time  . benazepril (LOTENSIN) 40 MG tablet Take 40 mg by mouth daily.    10/18/2017 at Unknown time  . ferrous sulfate 325 (65 FE) MG tablet Take 325 mg by mouth daily with breakfast.   10/18/2017 at Unknown time  . furosemide (LASIX) 40 MG tablet Take 40 mg by mouth daily as needed for fluid.   10/18/2017 at Unknown time  . gabapentin (NEURONTIN) 100 MG capsule Take 200 mg by mouth at bedtime.   10/17/2017 at Unknown time  . ibuprofen (ADVIL,MOTRIN) 200 MG tablet Take 600 mg by mouth every 6 (six) hours as needed for headache, mild pain or moderate pain.   Past Month at Unknown time  . levETIRAcetam (KEPPRA) 500 MG tablet Take 500 mg by mouth 2 (two) times daily.   10/18/2017 at Unknown time  . LORazepam (ATIVAN) 0.5 MG tablet Take 1 tablet (0.5 mg total) by mouth every 8 (eight) hours as needed for anxiety. 30 tablet 0 10/18/2017 at Unknown time  . lubiprostone (AMITIZA) 24 MCG capsule Take 24 mcg by mouth 2 (two) times daily with a meal.   10/18/2017 at Unknown time  . Melatonin 3 MG TABS Take 3 mg by mouth at bedtime as needed (for sleep).   10/17/2017 at Unknown time  . metFORMIN (GLUCOPHAGE) 500 MG tablet Take 500 mg by mouth 2 (two) times daily with a meal.    10/18/2017 at Unknown time  . metoprolol tartrate (LOPRESSOR) 25 MG tablet Take 12.5 mg by mouth 2 (two) times daily.    10/18/2017 at 600  . nitroGLYCERIN (NITROSTAT) 0.4 MG SL tablet Place 1 tablet (0.4 mg total) under the tongue every 5 (five) minutes as needed for chest pain. 25 tablet 1 unknown  . omega-3 acid ethyl esters (LOVAZA) 1 G capsule Take 2 g by mouth 2 (two) times daily.    10/18/2017 at Unknown time  . ondansetron (ZOFRAN-ODT) 4 MG disintegrating tablet Take 4 mg by mouth every 8 (eight) hours as needed for nausea or vomiting.   Past Month at Unknown time  . pantoprazole (PROTONIX) 40 MG tablet Take 1 tablet (40 mg total) by mouth daily. 30 tablet 3 10/18/2017 at Unknown time  .  potassium chloride (K-DUR,KLOR-CON) 10 MEQ tablet Take 10 mEq by mouth daily.   10/18/2017 at Unknown time  . senna-docusate (SENEXON-S) 8.6-50 MG tablet Take 1 tablet by mouth 2 (two) times daily.   10/18/2017 at Unknown time  . traZODone (DESYREL) 50 MG tablet Take 50 mg by mouth at bedtime.   10/17/2017 at Unknown time   Scheduled: . apixaban  5 mg Oral BID  . aspirin EC  81 mg Oral Daily  . atorvastatin  40 mg Oral QHS  . ferrous sulfate  325 mg Oral Q breakfast  . gabapentin  200 mg Oral QHS  . levETIRAcetam  500 mg Oral BID  . metoprolol tartrate  12.5 mg Oral BID  . omega-3 acid ethyl esters  2 g Oral BID  . pantoprazole  40 mg Oral Daily  . senna-docusate  1 tablet Oral BID  . traZODone  50 mg Oral QHS   Continuous: . sodium chloride 75 mL/hr at 10/20/17 0500  . cefTRIAXone (ROCEPHIN)  IV Stopped (10/19/17 1555)   HER:[id number] **OR** acetaminophen, nitroGLYCERIN, ondansetron **OR** ondansetron (ZOFRAN) IV, senna-docusate  Assesment: She was admitted with urinary tract infection.  She has metabolic encephalopathy from that.  I think she probably has some mild dementia at baseline.  She had acute kidney injury and that is better.  She is growing Klebsiella in her urine but we do not have sensitivities yet but she does look better. Principal Problem:   UTI (urinary tract infection) Active Problems:   Chronic diastolic heart failure (HCC)   Diabetes mellitus, type II ([place])   Hypertension   Gastroesophageal reflux disease   Hyperlipidemia   Chronic kidney disease (CKD), stage III (moderate) (HCC)   Atrial fibrillation ([place])   AKI (acute kidney injury) ([place])    Plan: Continue current treatments    LOS: 2 days   Mischell Branford L 10/20/2017, 8:41 AM

## 2017-10-20 NOTE — Progress Notes (Signed)
Pt's BP elevated at 170's/90's HR 50-60's. Pt currently resting in bed. Dr. Janna Archondiego, on call for Dr. Juanetta GoslingHawkins, paged and made aware. New orders received for Norvasc 5mg  PO now and then daily. Will continue to monitor patient.

## 2017-10-21 MED ORDER — LORAZEPAM 2 MG/ML IJ SOLN
0.5000 mg | Freq: Once | INTRAMUSCULAR | Status: AC
  Administered 2017-10-21: 0.5 mg via INTRAVENOUS
  Filled 2017-10-21: qty 1

## 2017-10-21 NOTE — Progress Notes (Signed)
Subjective: She had done pretty well yesterday morning but became much more agitated through the night and afternoon yesterday.  She has received Ativan and she is sleepy now.  No other new problems noted except her blood pressure has been up.  Objective: Vital signs in last 24 hours: Temp:  [97.8 F (36.6 C)-99 F (37.2 C)] 97.8 F (36.6 C) (11/17 0546) Pulse Rate:  [56-78] 77 (11/17 0557) Resp:  [18] 18 (11/17 0546) BP: (133-179)/(69-92) 165/79 (11/17 0557) SpO2:  [98 %-99 %] 99 % (11/17 0546) Weight change:  Last BM Date: 10/17/17  Intake/Output from previous day: 11/16 0701 - 11/17 0700 In: 1215 [P.O.:240; I.V.:875; IV Piggyback:100] Out: 800 [Urine:800]  PHYSICAL EXAM General appearance: alert, cooperative and no distress Resp: clear to auscultation bilaterally Cardio: regular rate and rhythm, S1, S2 normal, no murmur, click, rub or gallop GI: soft, non-tender; bowel sounds normal; no masses,  no organomegaly Extremities: extremities normal, atraumatic, no cyanosis or edema She is confused and now sleepy  Lab Results:  Results for orders placed or performed during the hospital encounter of 10/18/17 (from the past 48 hour(s))  Basic metabolic panel     Status: Abnormal   Collection Time: 10/20/17  6:24 AM  Result Value Ref Range   Sodium 138 135 - 145 mmol/L   Potassium 4.1 3.5 - 5.1 mmol/L   Chloride 110 101 - 111 mmol/L   CO2 20 (L) 22 - 32 mmol/L   Glucose, Bld 125 (H) 65 - 99 mg/dL   BUN 23 (H) 6 - 20 mg/dL   Creatinine, Ser 1.61 (H) 0.44 - 1.00 mg/dL   Calcium 9.3 8.9 - 10.3 mg/dL   GFR calc non Af Amer 30 (L) >60 mL/min   GFR calc Af Amer 34 (L) >60 mL/min    Comment: (NOTE) The eGFR has been calculated using the CKD EPI equation. This calculation has not been validated in all clinical situations. eGFR's persistently <60 mL/min signify possible Chronic Kidney Disease.    Anion gap 8 5 - 15    ABGS No results for input(s): PHART, PO2ART, TCO2, HCO3 in  the last 72 hours.  Invalid input(s): PCO2 CULTURES Recent Results (from the past 240 hour(s))  Urine culture     Status: Abnormal   Collection Time: 10/18/17  8:30 AM  Result Value Ref Range Status   Specimen Description URINE, RANDOM  Final   Special Requests NONE  Final   Culture >=100,000 COLONIES/mL KLEBSIELLA PNEUMONIAE (A)  Final   Report Status 10/20/2017 FINAL  Final   Organism ID, Bacteria KLEBSIELLA PNEUMONIAE (A)  Final      Susceptibility   Klebsiella pneumoniae - MIC*    AMPICILLIN >=32 RESISTANT Resistant     CEFAZOLIN <=4 SENSITIVE Sensitive     CEFTRIAXONE <=1 SENSITIVE Sensitive     CIPROFLOXACIN <=0.25 SENSITIVE Sensitive     GENTAMICIN <=1 SENSITIVE Sensitive     IMIPENEM <=0.25 SENSITIVE Sensitive     NITROFURANTOIN 64 INTERMEDIATE Intermediate     TRIMETH/SULFA <=20 SENSITIVE Sensitive     AMPICILLIN/SULBACTAM 4 SENSITIVE Sensitive     PIP/TAZO <=4 SENSITIVE Sensitive     Extended ESBL NEGATIVE Sensitive     * >=100,000 COLONIES/mL KLEBSIELLA PNEUMONIAE   Studies/Results: No results found.  Medications:  Prior to Admission:  Medications Prior to Admission  Medication Sig Dispense Refill Last Dose  . acetaminophen (TYLENOL) 325 MG tablet Take 325-650 mg by mouth every 6 (six) hours as needed for mild pain, moderate  pain or headache.    10/18/2017 at Unknown time  . apixaban (ELIQUIS) 5 MG TABS tablet Take 5 mg by mouth 2 (two) times daily.    10/18/2017 at 600  . aspirin EC 81 MG tablet Take 81 mg by mouth daily.   10/18/2017 at 600  . atorvastatin (LIPITOR) 40 MG tablet Take 40 mg by mouth at bedtime.   10/17/2017 at Unknown time  . benazepril (LOTENSIN) 40 MG tablet Take 40 mg by mouth daily.    10/18/2017 at Unknown time  . ferrous sulfate 325 (65 FE) MG tablet Take 325 mg by mouth daily with breakfast.   10/18/2017 at Unknown time  . furosemide (LASIX) 40 MG tablet Take 40 mg by mouth daily as needed for fluid.   10/18/2017 at Unknown time  .  gabapentin (NEURONTIN) 100 MG capsule Take 200 mg by mouth at bedtime.   10/17/2017 at Unknown time  . ibuprofen (ADVIL,MOTRIN) 200 MG tablet Take 600 mg by mouth every 6 (six) hours as needed for headache, mild pain or moderate pain.   Past Month at Unknown time  . levETIRAcetam (KEPPRA) 500 MG tablet Take 500 mg by mouth 2 (two) times daily.   10/18/2017 at Unknown time  . LORazepam (ATIVAN) 0.5 MG tablet Take 1 tablet (0.5 mg total) by mouth every 8 (eight) hours as needed for anxiety. 30 tablet 0 10/18/2017 at Unknown time  . lubiprostone (AMITIZA) 24 MCG capsule Take 24 mcg by mouth 2 (two) times daily with a meal.   10/18/2017 at Unknown time  . Melatonin 3 MG TABS Take 3 mg by mouth at bedtime as needed (for sleep).   10/17/2017 at Unknown time  . metFORMIN (GLUCOPHAGE) 500 MG tablet Take 500 mg by mouth 2 (two) times daily with a meal.    10/18/2017 at Unknown time  . metoprolol tartrate (LOPRESSOR) 25 MG tablet Take 12.5 mg by mouth 2 (two) times daily.    10/18/2017 at 600  . nitroGLYCERIN (NITROSTAT) 0.4 MG SL tablet Place 1 tablet (0.4 mg total) under the tongue every 5 (five) minutes as needed for chest pain. 25 tablet 1 unknown  . omega-3 acid ethyl esters (LOVAZA) 1 G capsule Take 2 g by mouth 2 (two) times daily.    10/18/2017 at Unknown time  . ondansetron (ZOFRAN-ODT) 4 MG disintegrating tablet Take 4 mg by mouth every 8 (eight) hours as needed for nausea or vomiting.   Past Month at Unknown time  . pantoprazole (PROTONIX) 40 MG tablet Take 1 tablet (40 mg total) by mouth daily. 30 tablet 3 10/18/2017 at Unknown time  . potassium chloride (K-DUR,KLOR-CON) 10 MEQ tablet Take 10 mEq by mouth daily.   10/18/2017 at Unknown time  . senna-docusate (SENEXON-S) 8.6-50 MG tablet Take 1 tablet by mouth 2 (two) times daily.   10/18/2017 at Unknown time  . traZODone (DESYREL) 50 MG tablet Take 50 mg by mouth at bedtime.   10/17/2017 at Unknown time   Scheduled: . amLODipine  5 mg Oral Daily   . apixaban  5 mg Oral BID  . aspirin EC  81 mg Oral Daily  . atorvastatin  40 mg Oral QHS  . ferrous sulfate  325 mg Oral Q breakfast  . gabapentin  200 mg Oral QHS  . levETIRAcetam  500 mg Oral BID  . metoprolol tartrate  12.5 mg Oral BID  . omega-3 acid ethyl esters  2 g Oral BID  . pantoprazole  40 mg Oral Daily  .  senna-docusate  1 tablet Oral BID  . traZODone  50 mg Oral QHS   Continuous: . sodium chloride 75 mL/hr at 10/20/17 2020  . cefTRIAXone (ROCEPHIN)  IV Stopped (10/20/17 0936)   QJS:[id number] **OR** acetaminophen, LORazepam, nitroGLYCERIN, ondansetron **OR** ondansetron (ZOFRAN) IV, senna-docusate  Assesment: She was admitted with metabolic encephalopathy presumably from urinary tract infection.  She also had acute kidney injury.  Her renal function is improved.  She had been doing well with her mental status yesterday but she is much more confused now.  It has been recommended that she go to skilled care facility at discharge.  She has multiple other medical problems including chronic diastolic heart failure.  She does not appear to be volume overloaded and I am going to decrease her IV fluids.  She has diabetes which is been pretty well controlled.  She has hypertension and is back on Norvasc. Principal Problem:   Urinary tract infection Active Problems:   PACEMAKER, PERMANENT   Chronic diastolic heart failure (HCC)   Diabetes mellitus, type II ([place])   Hypertension   Gastroesophageal reflux disease   Hyperlipidemia   Obesity   Chronic kidney disease (CKD), stage III (moderate) (HCC)   Seizure disorder (HCC)   Spinal stenosis, lumbar region, with neurogenic claudication   Osteoarthritis of left hip   Encephalopathy, metabolic   Atrial fibrillation ([place])   AKI (acute kidney injury) ([place])    Plan: Continue current treatments    LOS: 3 days   Karliah Kowalchuk L 10/21/2017, 9:36 AM

## 2017-10-22 MED ORDER — AMLODIPINE BESYLATE 5 MG PO TABS
10.0000 mg | ORAL_TABLET | Freq: Every day | ORAL | Status: DC
  Administered 2017-10-22 – 2017-10-23 (×2): 10 mg via ORAL
  Filled 2017-10-22 (×2): qty 2

## 2017-10-22 NOTE — Progress Notes (Signed)
PO meds given later this afternoon due to patient being lethargic this AM.  Patient now awake although still confused.  Able to take PO meds safely.

## 2017-10-22 NOTE — Progress Notes (Signed)
Subjective: She says she feels okay.  She is still having episodes of agitation and received Ativan for that.  She is mildly confused this morning.  Her blood pressure is better.  Objective: Vital signs in last 24 hours: Temp:  [98 F (36.7 C)-98.4 F (36.9 C)] 98 F (36.7 C) (11/18 0617) Pulse Rate:  [63-66] 66 (11/18 0617) Resp:  [18] 18 (11/18 0617) BP: (152-172)/(68-98) 152/71 (11/18 0617) SpO2:  [99 %-100 %] 100 % (11/18 0617) Weight change:  Last BM Date: 10/17/17  Intake/Output from previous day: 11/17 0701 - 11/18 0700 In: 2016.7 [I.V.:1966.7; IV Piggyback:50] Out: 2000 [Urine:2000]  PHYSICAL EXAM General appearance: alert and moderately obese Resp: clear to auscultation bilaterally Cardio: regular rate and rhythm, S1, S2 normal, no murmur, click, rub or gallop GI: soft, non-tender; bowel sounds normal; no masses,  no organomegaly Extremities: extremities normal, atraumatic, no cyanosis or edema Still confused  Lab Results:  No results found for this or any previous visit (from the past 48 hour(s)).  ABGS No results for input(s): PHART, PO2ART, TCO2, HCO3 in the last 72 hours.  Invalid input(s): PCO2 CULTURES Recent Results (from the past 240 hour(s))  Urine culture     Status: Abnormal   Collection Time: 10/18/17  8:30 AM  Result Value Ref Range Status   Specimen Description URINE, RANDOM  Final   Special Requests NONE  Final   Culture >=100,000 COLONIES/mL KLEBSIELLA PNEUMONIAE (A)  Final   Report Status 10/20/2017 FINAL  Final   Organism ID, Bacteria KLEBSIELLA PNEUMONIAE (A)  Final      Susceptibility   Klebsiella pneumoniae - MIC*    AMPICILLIN >=32 RESISTANT Resistant     CEFAZOLIN <=4 SENSITIVE Sensitive     CEFTRIAXONE <=1 SENSITIVE Sensitive     CIPROFLOXACIN <=0.25 SENSITIVE Sensitive     GENTAMICIN <=1 SENSITIVE Sensitive     IMIPENEM <=0.25 SENSITIVE Sensitive     NITROFURANTOIN 64 INTERMEDIATE Intermediate     TRIMETH/SULFA <=20 SENSITIVE  Sensitive     AMPICILLIN/SULBACTAM 4 SENSITIVE Sensitive     PIP/TAZO <=4 SENSITIVE Sensitive     Extended ESBL NEGATIVE Sensitive     * >=100,000 COLONIES/mL KLEBSIELLA PNEUMONIAE   Studies/Results: No results found.  Medications:  Prior to Admission:  Medications Prior to Admission  Medication Sig Dispense Refill Last Dose  . acetaminophen (TYLENOL) 325 MG tablet Take 325-650 mg by mouth every 6 (six) hours as needed for mild pain, moderate pain or headache.    10/18/2017 at Unknown time  . apixaban (ELIQUIS) 5 MG TABS tablet Take 5 mg by mouth 2 (two) times daily.    10/18/2017 at 600  . aspirin EC 81 MG tablet Take 81 mg by mouth daily.   10/18/2017 at 600  . atorvastatin (LIPITOR) 40 MG tablet Take 40 mg by mouth at bedtime.   10/17/2017 at Unknown time  . benazepril (LOTENSIN) 40 MG tablet Take 40 mg by mouth daily.    10/18/2017 at Unknown time  . ferrous sulfate 325 (65 FE) MG tablet Take 325 mg by mouth daily with breakfast.   10/18/2017 at Unknown time  . furosemide (LASIX) 40 MG tablet Take 40 mg by mouth daily as needed for fluid.   10/18/2017 at Unknown time  . gabapentin (NEURONTIN) 100 MG capsule Take 200 mg by mouth at bedtime.   10/17/2017 at Unknown time  . ibuprofen (ADVIL,MOTRIN) 200 MG tablet Take 600 mg by mouth every 6 (six) hours as needed for headache, mild  pain or moderate pain.   Past Month at Unknown time  . levETIRAcetam (KEPPRA) 500 MG tablet Take 500 mg by mouth 2 (two) times daily.   10/18/2017 at Unknown time  . LORazepam (ATIVAN) 0.5 MG tablet Take 1 tablet (0.5 mg total) by mouth every 8 (eight) hours as needed for anxiety. 30 tablet 0 10/18/2017 at Unknown time  . lubiprostone (AMITIZA) 24 MCG capsule Take 24 mcg by mouth 2 (two) times daily with a meal.   10/18/2017 at Unknown time  . Melatonin 3 MG TABS Take 3 mg by mouth at bedtime as needed (for sleep).   10/17/2017 at Unknown time  . metFORMIN (GLUCOPHAGE) 500 MG tablet Take 500 mg by mouth 2 (two)  times daily with a meal.    10/18/2017 at Unknown time  . metoprolol tartrate (LOPRESSOR) 25 MG tablet Take 12.5 mg by mouth 2 (two) times daily.    10/18/2017 at 600  . nitroGLYCERIN (NITROSTAT) 0.4 MG SL tablet Place 1 tablet (0.4 mg total) under the tongue every 5 (five) minutes as needed for chest pain. 25 tablet 1 unknown  . omega-3 acid ethyl esters (LOVAZA) 1 G capsule Take 2 g by mouth 2 (two) times daily.    10/18/2017 at Unknown time  . ondansetron (ZOFRAN-ODT) 4 MG disintegrating tablet Take 4 mg by mouth every 8 (eight) hours as needed for nausea or vomiting.   Past Month at Unknown time  . pantoprazole (PROTONIX) 40 MG tablet Take 1 tablet (40 mg total) by mouth daily. 30 tablet 3 10/18/2017 at Unknown time  . potassium chloride (K-DUR,KLOR-CON) 10 MEQ tablet Take 10 mEq by mouth daily.   10/18/2017 at Unknown time  . senna-docusate (SENEXON-S) 8.6-50 MG tablet Take 1 tablet by mouth 2 (two) times daily.   10/18/2017 at Unknown time  . traZODone (DESYREL) 50 MG tablet Take 50 mg by mouth at bedtime.   10/17/2017 at Unknown time   Scheduled: . amLODipine  5 mg Oral Daily  . apixaban  5 mg Oral BID  . aspirin EC  81 mg Oral Daily  . atorvastatin  40 mg Oral QHS  . ferrous sulfate  325 mg Oral Q breakfast  . gabapentin  200 mg Oral QHS  . levETIRAcetam  500 mg Oral BID  . metoprolol tartrate  12.5 mg Oral BID  . omega-3 acid ethyl esters  2 g Oral BID  . pantoprazole  40 mg Oral Daily  . senna-docusate  1 tablet Oral BID  . traZODone  50 mg Oral QHS   Continuous: . sodium chloride 50 mL/hr at 10/21/17 1729  . cefTRIAXone (ROCEPHIN)  IV Stopped (10/21/17 1050)   WUJ:[id number] **OR** acetaminophen, LORazepam, nitroGLYCERIN, ondansetron **OR** ondansetron (ZOFRAN) IV, senna-docusate  Assesment: She was admitted with urinary tract infection and metabolic encephalopathy from that.  She had acute kidney injury and appeared to be dehydrated.  She is improved.  She has Klebsiella  in her urine and her current and will cover that.  She has had significant problems with confusion and she is required Ativan.  She does need skilled care facility placement.  Her blood pressure is better but not well controlled yet. Principal Problem:   Urinary tract infection Active Problems:   PACEMAKER, PERMANENT   Chronic diastolic heart failure (HCC)   Diabetes mellitus, type II (HCC)   Hypertension   Gastroesophageal reflux disease   Hyperlipidemia   Obesity   Chronic kidney disease (CKD), stage III (moderate) (HCC)   Seizure  disorder [facility](HCC)   Spinal stenosis, lumbar region, with neurogenic claudication   Osteoarthritis of left hip   Encephalopathy, metabolic   Atrial fibrillation (HCC)   AKI (acute kidney injury) (HCC)    Plan: Continue treatments increase amlodipine because her blood pressure is still up a little bit    LOS: 4 days   Zelig Gacek L 10/22/2017, 9:26 AM

## 2017-10-22 NOTE — Plan of Care (Signed)
Patient with dementia, requires ativan frequently.  Unable to care for self, no family present at bedside

## 2017-10-23 DIAGNOSIS — N179 Acute kidney failure, unspecified: Secondary | ICD-10-CM | POA: Diagnosis not present

## 2017-10-23 DIAGNOSIS — F0391 Unspecified dementia with behavioral disturbance: Secondary | ICD-10-CM | POA: Diagnosis not present

## 2017-10-23 DIAGNOSIS — I4891 Unspecified atrial fibrillation: Secondary | ICD-10-CM | POA: Diagnosis not present

## 2017-10-23 DIAGNOSIS — I10 Essential (primary) hypertension: Secondary | ICD-10-CM | POA: Diagnosis not present

## 2017-10-23 DIAGNOSIS — R41841 Cognitive communication deficit: Secondary | ICD-10-CM | POA: Diagnosis not present

## 2017-10-23 DIAGNOSIS — J189 Pneumonia, unspecified organism: Secondary | ICD-10-CM | POA: Diagnosis not present

## 2017-10-23 DIAGNOSIS — M48061 Spinal stenosis, lumbar region without neurogenic claudication: Secondary | ICD-10-CM | POA: Diagnosis not present

## 2017-10-23 DIAGNOSIS — R54 Age-related physical debility: Secondary | ICD-10-CM | POA: Diagnosis not present

## 2017-10-23 DIAGNOSIS — G9341 Metabolic encephalopathy: Secondary | ICD-10-CM | POA: Diagnosis not present

## 2017-10-23 DIAGNOSIS — N390 Urinary tract infection, site not specified: Secondary | ICD-10-CM | POA: Diagnosis not present

## 2017-10-23 DIAGNOSIS — R279 Unspecified lack of coordination: Secondary | ICD-10-CM | POA: Diagnosis not present

## 2017-10-23 DIAGNOSIS — D649 Anemia, unspecified: Secondary | ICD-10-CM | POA: Diagnosis not present

## 2017-10-23 DIAGNOSIS — K219 Gastro-esophageal reflux disease without esophagitis: Secondary | ICD-10-CM | POA: Diagnosis not present

## 2017-10-23 DIAGNOSIS — E1140 Type 2 diabetes mellitus with diabetic neuropathy, unspecified: Secondary | ICD-10-CM | POA: Diagnosis not present

## 2017-10-23 DIAGNOSIS — M6281 Muscle weakness (generalized): Secondary | ICD-10-CM | POA: Diagnosis not present

## 2017-10-23 DIAGNOSIS — R2689 Other abnormalities of gait and mobility: Secondary | ICD-10-CM | POA: Diagnosis not present

## 2017-10-23 DIAGNOSIS — E118 Type 2 diabetes mellitus with unspecified complications: Secondary | ICD-10-CM | POA: Diagnosis not present

## 2017-10-23 DIAGNOSIS — E119 Type 2 diabetes mellitus without complications: Secondary | ICD-10-CM | POA: Diagnosis not present

## 2017-10-23 DIAGNOSIS — I25118 Atherosclerotic heart disease of native coronary artery with other forms of angina pectoris: Secondary | ICD-10-CM | POA: Diagnosis not present

## 2017-10-23 DIAGNOSIS — I5020 Unspecified systolic (congestive) heart failure: Secondary | ICD-10-CM | POA: Diagnosis not present

## 2017-10-23 DIAGNOSIS — Z7401 Bed confinement status: Secondary | ICD-10-CM | POA: Diagnosis not present

## 2017-10-23 MED ORDER — CEPHALEXIN 250 MG PO CAPS
250.0000 mg | ORAL_CAPSULE | Freq: Three times a day (TID) | ORAL | Status: DC
Start: 2017-10-23 — End: 2018-11-02

## 2017-10-23 MED ORDER — AMLODIPINE BESYLATE 10 MG PO TABS: 10.0000 mg | ORAL_TABLET | Freq: Every day | ORAL | Status: DC

## 2017-10-23 MED ORDER — HEPARIN SOD (PORK) LOCK FLUSH 100 UNIT/ML IV SOLN
500.0000 [IU] | INTRAVENOUS | Status: AC | PRN
  Administered 2017-10-23: 500 [IU]
  Filled 2017-10-23: qty 5

## 2017-10-23 MED ORDER — CEPHALEXIN 250 MG PO CAPS
250.0000 mg | ORAL_CAPSULE | Freq: Three times a day (TID) | ORAL | Status: DC
Start: 2017-10-23 — End: 2017-10-23
  Administered 2017-10-23: 250 mg via ORAL
  Filled 2017-10-23: qty 1

## 2017-10-23 NOTE — Clinical Social Work Note (Signed)
[facility]. [facility] and was discharged and her daughter immediately [facility] where she was [facility]. Daughter advised that she was interested in patient returning to rehab. She stated that at baseline, patient walks with a cane, however, now she has a walker. She states that patient lives with her and that she works and that patient does not need to be home alone.   See previous full assessment below:  Clinical Social Work Assessment  Patient Details  Name: [name] MRN: [id number] Date of Birth: Apr 23, 1941  Date of referral:  09/29/17               Reason for consult:  Facility Placement                 Permission sought to share information with:    Permission granted to share information::                Name::                   Agency::                Relationship::                Contact Information:     Housing/Transportation Living arrangements for the past 2 months:  Single Family Home Source of Information:  Adult Children Patient Interpreter Needed:  None Criminal Activity/Legal Involvement Pertinent to Current Situation/Hospitalization:    Significant Relationships:  Adult Children Lives with:  Adult Children Do you feel safe going back to the place where you live?  Yes Need for family participation in patient care:  Yes (Comment)  Care giving concerns: No care giving concerns identified   Social Worker assessment / plan:  Pt is a 76 year old female admitted with acute kidney injury per H&P. Pt is sleeping at the time of CSW attempted visit. Chart notes indicate pt has had some confusion here in the hospital and may have some baseline dementia that becomes worse when she has an infection and when she is out of her familiar environment. CSW spoke with pt's daughter, [name], by phone to assess and assist with dc planning. Per dtr, pt resides with her in  their single family home. Pt is alone during the day and is able to meet her basic needs. Pt's daughter does assist pt with bathing, medications, and transportation. Dtr reports no barriers to pt obtaining prescribed medication. Pt has both a cane and a walker at home for ambulation. MD is indicating that pt will likely need short term SNF rehab at dc. Pt's daughter is aware. She states that she/pt would like a [facility] and [facility]. CSW will initiate referrals and will follow for dc planning needs. No definite dc date known at this time.  Employment status:  Retired Health and safety inspectornsurance information:  Medicare PT Recommendations:  Skilled Nursing Facility Information / Referral to community resources:  Skilled Nursing Facility  Patient/Family's Response to care:  Pt family agreeable to and appreciative of CSW involvement.  Patient/Family's Understanding of and Emotional Response to Diagnosis, Current Treatment, and Prognosis:  Pt s daughter appears to understand and is accepting of plan.  Emotional Assessment Appearance:  Appears stated age Attitude/Demeanor/Rapport:    Affect (typically observed):    Orientation:  Oriented to Self Alcohol / Substance use:    Psych involvement (  Current and /or in the community):     Discharge Needs  Concerns to be addressed:  Discharge Planning Concerns Readmission within the last 30 days:  Yes Current discharge risk:    Barriers to Discharge:      Christine GaultKathleen Vescio, LCSW 09/29/2017, 11:46 AM

## 2017-10-23 NOTE — NC FL2 (Signed)
[place] MEDICAID FL2 LEVEL OF CARE SCREENING TOOL     IDENTIFICATION  Patient Name: [name] Birthdate: February 11, 1941 Sex: female Admission Date (Current Location): 10/18/2017  [place]:  [place] [id number] [place] Facility and Address:  [facility],  [address], [place] [id number]      Provider Number: [id number]  Attending Physician Name and Address:  Kari BaarsHawkins, Edward, MD  Relative Name and Phone Number:       Current Level of Care: Hospital Recommended Level of Care: Skilled Nursing Facility Prior Approval Number:    Date Approved/Denied:   PASRR Number: [id number]  Discharge Plan: SNF    Current Diagnoses: Patient Active Problem List   Diagnosis Date Noted  . Urinary tract infection 10/18/2017  . HCAP (healthcare-associated pneumonia) 09/26/2017  . Lobar pneumonia (HCC) 09/19/2017  . Acute metabolic encephalopathy 09/19/2017  . Concussion with no loss of consciousness 09/28/2016  . AKI (acute kidney injury) (HCC) 09/25/2016  . Acute kidney injury (nontraumatic) (HCC) 09/25/2016  . Fall 09/25/2016  . Acute kidney injury (HCC) 09/25/2016  . Chest pain 08/29/2016  . Atrial fibrillation (HCC) 02/06/2015  . Encephalopathy, metabolic 10/12/2014  . Intractable nausea and vomiting 10/09/2014  . Epigastric abdominal pain 10/09/2014  . Expected blood loss anemia 12/04/2013  . S/P left THA, AA 12/03/2013  . Protein-calorie malnutrition, severe (HCC) 10/02/2013  . Precordial pain 09/30/2013  . Failure to thrive 07/26/2013  . Anemia of chronic disease 07/13/2013  . Altered mental state 07/13/2013  . NSTEMI (non-ST elevated myocardial infarction) (HCC) 07/01/2013  . Acute delirium 06/29/2013  . Fecal impaction (HCC) 06/29/2013  . Total knee replacement status 05/22/2013  . H/O total hip arthroplasty 05/22/2013  . Spinal stenosis, lumbar region, with neurogenic claudication 05/22/2013  . Osteoarthritis of left hip 05/22/2013  .  Noninfectious gastroenteritis and colitis 03/05/2013  . Dehydration 03/05/2013  . Family history of colon cancer 02/15/2013  . PUD (peptic ulcer disease) 02/15/2013  . Diabetes mellitus, type II (HCC)   . Hypertension   . Arteriosclerotic cardiovascular disease (ASCVD)   . Gastroesophageal reflux disease   . Hyperlipidemia   . Deep vein thrombosis, upper right extremity (HCC)   . Obesity   . AV block   . COPD (chronic obstructive pulmonary disease) (HCC)   . Sleep apnea   . Chronic kidney disease (CKD), stage III (moderate) (HCC)   . Seizure disorder (HCC)   . Anemia, normocytic normochromic 12/15/2011  . Chronic diastolic heart failure (HCC) 11/18/2011  . PACEMAKER, PERMANENT 05/13/2009  . 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 GASTRIC ULCER 05/12/2009  . Gastroparesis 05/12/2009  . DIVERTICULAR DISEASE 05/12/2009    Orientation RESPIRATION BLADDER Height & Weight     Self, Place  Normal Incontinent Weight: 191 lb (86.6 kg) Height:  5\' 2"  (157.5 cm)  BEHAVIORAL SYMPTOMS/MOOD NEUROLOGICAL BOWEL NUTRITION STATUS      Continent Diet(Heart Healthy/carb modified )  AMBULATORY STATUS COMMUNICATION OF NEEDS Skin   Limited Assist Verbally Normal                       Personal Care Assistance Level of Assistance  Bathing, Feeding, Dressing Bathing Assistance: Limited assistance Feeding assistance: Independent Dressing Assistance: Limited assistance     Functional Limitations Info  Sight, Hearing, Speech Sight Info: Adequate Hearing Info: Adequate Speech Info: Adequate    SPECIAL CARE FACTORS FREQUENCY  PT (By licensed PT)     PT Frequency: 5x/week  Contractures Contractures Info: Not present    Additional Factors Info  Code Status, Allergies, Psychotropic, Isolation Precautions Code Status Info: Full code Allergies Info: Codeine, Food (leafy raw vegetables and seeds), Levaquin, Oxycodone, Peanut containing drug products, Reglan Psychotropic Info:  Ativan, Dysrel    Isolation Precautions Info: 10.17.18 mrsa by pcr     Current Medications (10/23/2017):  This is the current hospital active medication list Current Facility-Administered Medications  Medication Dose Route Frequency Provider Last Rate Last Dose  . 0.9 %  sodium chloride infusion   Intravenous Continuous Kari BaarsHawkins, Edward, MD 50 mL/hr at 10/23/17 [id number]    . acetaminophen (TYLENOL) tablet 650 mg  650 mg Oral Q6H PRN Philip AspenHernandez Acosta, Limmie PatriciaEstela Y, MD   650 mg at 10/22/17 2242   Or  . acetaminophen (TYLENOL) suppository 650 mg  650 mg Rectal Q6H PRN Philip AspenHernandez Acosta, Limmie PatriciaEstela Y, MD      . amLODipine (NORVASC) tablet 10 mg  10 mg Oral Daily Kari BaarsHawkins, Edward, MD   10 mg at 10/22/17 1352  . apixaban (ELIQUIS) tablet 5 mg  5 mg Oral BID Philip AspenHernandez Acosta, Limmie PatriciaEstela Y, MD   5 mg at 10/22/17 2241  . aspirin EC tablet 81 mg  81 mg Oral Daily Philip AspenHernandez Acosta, Limmie PatriciaEstela Y, MD   81 mg at 10/22/17 1402  . atorvastatin (LIPITOR) tablet 40 mg  40 mg Oral QHS Philip AspenHernandez Acosta, Limmie PatriciaEstela Y, MD   40 mg at 10/22/17 2241  . cephALEXin (KEFLEX) capsule 250 mg  250 mg Oral Q8H Kari BaarsHawkins, Edward, MD   250 mg at 10/23/17 [id number]  . ferrous sulfate tablet 325 mg  325 mg Oral Q breakfast Philip AspenHernandez Acosta, Limmie PatriciaEstela Y, MD   325 mg at 10/23/17 [id number]  . gabapentin (NEURONTIN) capsule 200 mg  200 mg Oral QHS Philip AspenHernandez Acosta, Limmie PatriciaEstela Y, MD   200 mg at 10/22/17 2241  . levETIRAcetam (KEPPRA) tablet 500 mg  500 mg Oral BID Philip AspenHernandez Acosta, Limmie PatriciaEstela Y, MD   500 mg at 10/22/17 2242  . LORazepam (ATIVAN) injection 0.5 mg  0.5 mg Intravenous Q4H PRN Kari BaarsHawkins, Edward, MD   0.5 mg at 10/22/17 1408  . metoprolol tartrate (LOPRESSOR) tablet 12.5 mg  12.5 mg Oral BID Philip AspenHernandez Acosta, Limmie PatriciaEstela Y, MD   12.5 mg at 10/22/17 2241  . nitroGLYCERIN (NITROSTAT) SL tablet 0.4 mg  0.4 mg Sublingual Q5 min PRN Philip AspenHernandez Acosta, Limmie PatriciaEstela Y, MD      . omega-3 acid ethyl esters (LOVAZA) capsule 2 g  2 g Oral BID Philip AspenHernandez Acosta, Limmie PatriciaEstela Y, MD   2 g at 10/22/17 2242   . ondansetron (ZOFRAN) tablet 4 mg  4 mg Oral Q6H PRN Philip AspenHernandez Acosta, Limmie PatriciaEstela Y, MD       Or  . ondansetron [facility](ZOFRAN) injection 4 mg  4 mg Intravenous Q6H PRN Philip AspenHernandez Acosta, Limmie PatriciaEstela Y, MD      . pantoprazole (PROTONIX) EC tablet 40 mg  40 mg Oral Daily Philip AspenHernandez Acosta, Limmie PatriciaEstela Y, MD   40 mg at 10/22/17 1403  . senna-docusate (Senokot-S) tablet 1 tablet  1 tablet Oral BID Philip AspenHernandez Acosta, Limmie PatriciaEstela Y, MD   1 tablet at 10/22/17 2241  . senna-docusate (Senokot-S) tablet 1 tablet  1 tablet Oral QHS PRN Philip AspenHernandez Acosta, Limmie PatriciaEstela Y, MD      . traZODone (DESYREL) tablet 50 mg  50 mg Oral QHS Philip AspenHernandez Acosta, Limmie PatriciaEstela Y, MD   50 mg at 10/22/17 2241     Discharge Medications: Please see discharge summary for a list of  discharge medications.  Relevant Imaging Results:  Relevant Lab Results:   Additional Information SSN: [id number]  Annice Needy, LCSW

## 2017-10-23 NOTE — Discharge Summary (Signed)
Physician Discharge Summary  Patient ID: [name] MRN: [id number] DOB/AGE: 04-20-1941 76 y.o. Primary Care Physician:Tharon Bomar, Ramon Dredge, MD Admit date: 10/18/2017 Discharge date: 10/23/2017    Discharge Diagnoses:   Principal Problem:   Urinary tract infection Active Problems:   PACEMAKER, PERMANENT   Chronic diastolic heart failure (HCC)   Diabetes mellitus, type II (HCC)   Hypertension   Gastroesophageal reflux disease   Hyperlipidemia   Obesity   Chronic kidney disease (CKD), stage III (moderate) (HCC)   Seizure disorder (HCC)   Spinal stenosis, lumbar region, with neurogenic claudication   Osteoarthritis of left hip   Encephalopathy, metabolic   Atrial fibrillation (HCC)   AKI (acute kidney injury) (HCC) Dementia  Allergies as of 10/23/2017      Reactions   Codeine Other (See Comments)   Reaction:  Headaches    Food Nausea And Vomiting, Other (See Comments)   Pt is allergic to leafy raw vegetables and seeds.     Levaquin [levofloxacin] Other (See Comments)   Confusion, altered mental status, inability to walk   Oxycodone Hcl Other (See Comments)   Reaction:  Headaches    Peanut-containing Drug Products Nausea And Vomiting   Reglan [metoclopramide] Other (See Comments)   Reaction:  Confusion       Medication List    STOP taking these medications   benazepril 40 MG tablet Commonly known as:  LOTENSIN     TAKE these medications   acetaminophen 325 MG tablet Commonly known as:  TYLENOL Take 325-650 mg by mouth every 6 (six) hours as needed for mild pain, moderate pain or headache.   amLODipine 10 MG tablet Commonly known as:  NORVASC Take 1 tablet (10 mg total) daily by mouth.   apixaban 5 MG Tabs tablet Commonly known as:  ELIQUIS Take 5 mg by mouth 2 (two) times daily.   aspirin EC 81 MG tablet Take 81 mg by mouth daily.   atorvastatin 40 MG tablet Commonly known as:  LIPITOR Take 40 mg by mouth at bedtime.   cephALEXin 250 MG  capsule Commonly known as:  KEFLEX Take 1 capsule (250 mg total) every 8 (eight) hours by mouth.   ferrous sulfate 325 (65 FE) MG tablet Take 325 mg by mouth daily with breakfast.   furosemide 40 MG tablet Commonly known as:  LASIX Take 40 mg by mouth daily as needed for fluid.   gabapentin 100 MG capsule Commonly known as:  NEURONTIN Take 200 mg by mouth at bedtime.   ibuprofen 200 MG tablet Commonly known as:  ADVIL,MOTRIN Take 600 mg by mouth every 6 (six) hours as needed for headache, mild pain or moderate pain.   levETIRAcetam 500 MG tablet Commonly known as:  KEPPRA Take 500 mg by mouth 2 (two) times daily.   LORazepam 0.5 MG tablet Commonly known as:  ATIVAN Take 1 tablet (0.5 mg total) by mouth every 8 (eight) hours as needed for anxiety.   lubiprostone 24 MCG capsule Commonly known as:  AMITIZA Take 24 mcg by mouth 2 (two) times daily with a meal.   Melatonin 3 MG Tabs Take 3 mg by mouth at bedtime as needed (for sleep).   metFORMIN 500 MG tablet Commonly known as:  GLUCOPHAGE Take 500 mg by mouth 2 (two) times daily with a meal.   metoprolol tartrate 25 MG tablet Commonly known as:  LOPRESSOR Take 12.5 mg by mouth 2 (two) times daily.   nitroGLYCERIN 0.4 MG SL tablet Commonly known as:  NITROSTAT Place 1 tablet (0.4 mg total) under the tongue every 5 (five) minutes as needed for chest pain.   omega-3 acid ethyl esters 1 g capsule Commonly known as:  LOVAZA Take 2 g by mouth 2 (two) times daily.   ondansetron 4 MG disintegrating tablet Commonly known as:  ZOFRAN-ODT Take 4 mg by mouth every 8 (eight) hours as needed for nausea or vomiting.   pantoprazole 40 MG tablet Commonly known as:  PROTONIX Take 1 tablet (40 mg total) by mouth daily.   potassium chloride 10 MEQ tablet Commonly known as:  K-DUR,KLOR-CON Take 10 mEq by mouth daily.   SENEXON-S 8.6-50 MG tablet Generic drug:  senna-docusate Take 1 tablet by mouth 2 (two) times daily.    traZODone 50 MG tablet Commonly known as:  DESYREL Take 50 mg by mouth at bedtime.       Discharged Condition: Improved    Consults: None  Significant Diagnostic Studies: Ct Abdomen Pelvis Wo Contrast  Result Date: 10/18/2017 CLINICAL DATA:  Unspecified abdominal pain with diarrhea with nausea and vomiting. Foul smelling urine EXAM: CT ABDOMEN AND PELVIS WITHOUT CONTRAST TECHNIQUE: Multidetector CT imaging of the abdomen and pelvis was performed following the standard protocol without IV contrast. COMPARISON:  02/02/2015 FINDINGS: Lower chest: Cardiomegaly and biventricular pacer. Coronary atherosclerotic calcification. Mild scarring or atelectasis in the lower lobes. Hepatobiliary: No focal liver abnormality.Cholecystectomy with chronic common bile duct dilatation, presumably reservoir effect given stability. Common bile duct measures up to 1 cm at the midportion. Closely neighboring calcification of the pancreatic head is atherosclerotic. Pancreas: Unremarkable. Spleen: Unremarkable. Adrenals/Urinary Tract: Negative adrenals. No hydronephrosis or stone. Calcifications along the upper left ureter have the appearance of phleboliths. Negative bladder where not obscured by streak artifact from hip prosthesis. Stomach/Bowel: Fluid levels in the distal colon correlating with history of diarrhea. Distal colonic diverticulosis. No evidence of inflammatory bowel wall thickening. No bowel obstruction. The stomach is moderately distended. No indication of chronic vomiting. Vascular/Lymphatic: Diffuse atherosclerotic calcification of the aorta and its branches. No acute vascular finding. Negative for mass or adenopathy. Reproductive:Hysterectomy.  Possible oophorectomies. Other: Small fatty umbilical hernia that was also seen in 2016. Probable right inguinal hernia repair. Musculoskeletal: Bilateral hip replacement. There is a chronic cystic density mass along the lower right iliacus measuring up to 3.5 cm.  L3-4 and L4-5 discectomy and solid arthrodesis. Advanced super adjacent lumbar disc degeneration. Osteopenia. IMPRESSION: 1. Colonic fluid levels correlating with history of diarrhea. No bowel wall thickening or obstruction. 2.  Aortic Atherosclerosis (ICD10-I70.0).  Coronary atherosclerosis. 3. 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 Bilateral hip replacement. 4. Chronic fatty umbilical hernia. Electronically Signed   By: Marnee SpringJonathon  Watts M.D.   On: 10/18/2017 10:23   Dg Chest 2 View  Result Date: 10/18/2017 CLINICAL DATA:  Abdominal pain, nausea, vomiting, diarrhea and generalized weakness. EXAM: CHEST  2 VIEW COMPARISON:  09/28/2017. FINDINGS: Trachea is midline. Heart is enlarged, stable. Thoracic aorta is calcified. Pacemaker lead tips are in the right atrium and right ventricle. Lungs are somewhat low in volume but are grossly clear. No pleural fluid. IMPRESSION: No acute findings. Electronically Signed   By: Leanna BattlesMelinda  Blietz M.D.   On: 10/18/2017 10:12   Ct Head Wo Contrast  Result Date: 09/26/2017 CLINICAL DATA:  76 year old discharged from the hospital yesterday with a diagnosis of pneumonia, presenting today with worsening confusion and increasing weakness. EXAM: CT HEAD WITHOUT CONTRAST TECHNIQUE: Contiguous axial images were obtained from the  base of the skull through the vertex without intravenous contrast. COMPARISON:  09/19/2017, 09/24/2016 and earlier. FINDINGS: Patient motion degraded images of the skull base. Brain: Mild cortical atrophy and moderate deep atrophy, unchanged dating back to 2014. Moderate to severe changes of small vessel disease of the white matter diffusely, unchanged. 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 No mass lesion. No midline shift. No acute hemorrhage or hematoma. No extra-axial fluid collections. No evidence of acute infarction. Vascular: Severe atherosclerosis involving the left vertebral artery.  Severe bilateral carotid siphon atherosclerosis. No hyperdense vessel. Skull: No skull fracture or other focal osseous abnormality involving the skull. Sinuses/Orbits: Visualized paranasal sinuses, bilateral mastoid air cells and bilateral middle ear cavities well-aerated. Visualized orbits and globes are normal. Other: None. IMPRESSION: 1. No acute intracranial abnormality. 2. Stable generalized atrophy and moderate to severe chronic microvascular ischemic changes of the white matter. Electronically Signed   By: Hulan Saas M.D.   On: 09/26/2017 20:49   Dg Chest Port 1 View  Result Date: 09/28/2017 CLINICAL DATA:  Respiratory difficulty EXAM: PORTABLE CHEST 1 VIEW COMPARISON:  09/26/2017 FINDINGS: Cardiac shadow is enlarged. Postsurgical changes as well as a pacing device are again seen and stable. Left-sided chest wall port is again noted and stable. Lungs are well aerated bilaterally without focal infiltrate or sizable effusion. No acute bony abnormality is seen. IMPRESSION: No active disease. Electronically Signed   By: Alcide Clever M.D.   On: 09/28/2017 08:31   Dg Chest Port 1 View  Result Date: 09/26/2017 CLINICAL DATA:  Weakness. The patient was discharged from the hospital yesterday after being treated for pneumonia. EXAM: PORTABLE CHEST 1 VIEW COMPARISON:  PA and lateral chest 08/29/2016 and 09/19/2017. FINDINGS: The patient is rotated on the exam. There is cardiomegaly without edema. No pneumothorax or pleural effusion. Lungs clear. Pacing device is in place. Aortic atherosclerosis is noted. IMPRESSION: Cardiomegaly without acute disease. Electronically Signed   By: Drusilla Kanner M.D.   On: 09/26/2017 19:55    Lab Results: Basic Metabolic Panel: No results for input(s): NA, K, CL, CO2, GLUCOSE, BUN, CREATININE, CALCIUM, MG, PHOS in the last 72 hours. Liver Function Tests: No results for input(s): AST, ALT, ALKPHOS, BILITOT, PROT, ALBUMIN in the last 72 hours.   CBC: No results  for input(s): WBC, NEUTROABS, HGB, HCT, MCV, PLT in the last 72 hours.  Recent Results (from the past 240 hour(s))  Urine culture     Status: Abnormal   Collection Time: 10/18/17  8:30 AM  Result Value Ref Range Status   Specimen Description URINE, RANDOM  Final   Special Requests NONE  Final   Culture >=100,000 COLONIES/mL KLEBSIELLA PNEUMONIAE (A)  Final   Report Status 10/20/2017 FINAL  Final   Organism ID, Bacteria KLEBSIELLA PNEUMONIAE (A)  Final      Susceptibility   Klebsiella pneumoniae - MIC*    AMPICILLIN >=32 RESISTANT Resistant     CEFAZOLIN <=4 SENSITIVE Sensitive     CEFTRIAXONE <=1 SENSITIVE Sensitive     CIPROFLOXACIN <=0.25 SENSITIVE Sensitive     GENTAMICIN <=1 SENSITIVE Sensitive     IMIPENEM <=0.25 SENSITIVE Sensitive     NITROFURANTOIN 64 INTERMEDIATE Intermediate     TRIMETH/SULFA <=20 SENSITIVE Sensitive     AMPICILLIN/SULBACTAM 4 SENSITIVE Sensitive     PIP/TAZO <=4 SENSITIVE Sensitive     Extended ESBL NEGATIVE Sensitive     * >=100,000 COLONIES/mL KLEBSIELLA PNEUMONIAE     Hospital Course: This is a  76 year old who was in the hospital last month with pneumonia was transferred to a skilled care facility and then was discharged home from the skilled care facility about 2 days ago.  Her daughter was concerned that she might be having something like a urinary tract infection when she was discharged and when she got back home she became increasingly confused was not eating or drinking and was brought back to the emergency department and found to have a urinary tract infection.  She had significant metabolic encephalopathy.  She had metabolic encephalopathy at her last admission and that never totally cleared back to her baseline mental status.  She was found to have acute kidney injury was dehydrated and had fluid replacement.  She was started on IV antibiotics.  She improved but was still confused and occasionally agitated.  It was recommended that she go back to  skilled care facility.  Discharge Exam: Blood pressure (!) 161/81, pulse 80, temperature 98.8 F (37.1 C), temperature source Oral, resp. rate 16, height 5\' 2"  (1.575 m), weight 86.6 kg (191 lb), SpO2 98 %. She is awake and alert but confused.  Her abdomen is soft.  Chest is clear.  Disposition: To skilled care facility.  She will need PT OT and speech.  She will be on a heart healthy carb modified diet.  She will be on Keflex 250 mg 3 times daily x5 more days.      Signed: Isabellarose Kope L   10/23/2017, 7:53 AM

## 2017-10-23 NOTE — Care Management Important Message (Signed)
Important Message  Patient Details  Name: Dayle PointsSilver P Yaney MRN: [id number] Date of Birth: 02-06-1941   Medicare Important Message Given:  Yes    Malcolm MetroChildress, Ayonna Speranza Demske, RN 10/23/2017, 12:26 PM

## 2017-10-23 NOTE — Progress Notes (Signed)
Subjective: She is about the same.  Still mildly confused.  No new problems have been noted  Objective: Vital signs in last 24 hours: Temp:  [98.2 F (36.8 C)-98.8 F (37.1 C)] 98.8 F (37.1 C) (11/19 0508) Pulse Rate:  [72-103] 80 (11/19 0508) Resp:  [16-18] 16 (11/19 0508) BP: (151-161)/(81-90) 161/81 (11/19 0508) SpO2:  [98 %-99 %] 98 % (11/19 0508) Weight change:  Last BM Date: 10/17/17  Intake/Output from previous day: 11/18 0701 - 11/19 0700 In: 1767.5 [P.O.:480; I.V.:1237.5; IV Piggyback:50] Out: 700 [Urine:700]  PHYSICAL EXAM General appearance: alert and Obese mildly confused Resp: clear to auscultation bilaterally Cardio: regular rate and rhythm, S1, S2 normal, no murmur, click, rub or gallop GI: soft, non-tender; bowel sounds normal; no masses,  no organomegaly Extremities: extremities normal, atraumatic, no cyanosis or edema Skin warm and dry  Lab Results:  No results found for this or any previous visit (from the past 48 hour(s)).  ABGS No results for input(s): PHART, PO2ART, TCO2, HCO3 in the last 72 hours.  Invalid input(s): PCO2 CULTURES Recent Results (from the past 240 hour(s))  Urine culture     Status: Abnormal   Collection Time: 10/18/17  8:30 AM  Result Value Ref Range Status   Specimen Description URINE, RANDOM  Final   Special Requests NONE  Final   Culture >=100,000 COLONIES/mL KLEBSIELLA PNEUMONIAE (A)  Final   Report Status 10/20/2017 FINAL  Final   Organism ID, Bacteria KLEBSIELLA PNEUMONIAE (A)  Final      Susceptibility   Klebsiella pneumoniae - MIC*    AMPICILLIN >=32 RESISTANT Resistant     CEFAZOLIN <=4 SENSITIVE Sensitive     CEFTRIAXONE <=1 SENSITIVE Sensitive     CIPROFLOXACIN <=0.25 SENSITIVE Sensitive     GENTAMICIN <=1 SENSITIVE Sensitive     IMIPENEM <=0.25 SENSITIVE Sensitive     NITROFURANTOIN 64 INTERMEDIATE Intermediate     TRIMETH/SULFA <=20 SENSITIVE Sensitive     AMPICILLIN/SULBACTAM 4 SENSITIVE Sensitive    PIP/TAZO <=4 SENSITIVE Sensitive     Extended ESBL NEGATIVE Sensitive     * >=100,000 COLONIES/mL KLEBSIELLA PNEUMONIAE   Studies/Results: No results found.  Medications:  Prior to Admission:  Medications Prior to Admission  Medication Sig Dispense Refill Last Dose  . acetaminophen (TYLENOL) 325 MG tablet Take 325-650 mg by mouth every 6 (six) hours as needed for mild pain, moderate pain or headache.    10/18/2017 at Unknown time  . apixaban (ELIQUIS) 5 MG TABS tablet Take 5 mg by mouth 2 (two) times daily.    10/18/2017 at 600  . aspirin EC 81 MG tablet Take 81 mg by mouth daily.   10/18/2017 at 600  . atorvastatin (LIPITOR) 40 MG tablet Take 40 mg by mouth at bedtime.   10/17/2017 at Unknown time  . benazepril (LOTENSIN) 40 MG tablet Take 40 mg by mouth daily.    10/18/2017 at Unknown time  . ferrous sulfate 325 (65 FE) MG tablet Take 325 mg by mouth daily with breakfast.   10/18/2017 at Unknown time  . furosemide (LASIX) 40 MG tablet Take 40 mg by mouth daily as needed for fluid.   10/18/2017 at Unknown time  . gabapentin (NEURONTIN) 100 MG capsule Take 200 mg by mouth at bedtime.   10/17/2017 at Unknown time  . ibuprofen (ADVIL,MOTRIN) 200 MG tablet Take 600 mg by mouth every 6 (six) hours as needed for headache, mild pain or moderate pain.   Past Month at Unknown time  .  levETIRAcetam (KEPPRA) 500 MG tablet Take 500 mg by mouth 2 (two) times daily.   10/18/2017 at Unknown time  . LORazepam (ATIVAN) 0.5 MG tablet Take 1 tablet (0.5 mg total) by mouth every 8 (eight) hours as needed for anxiety. 30 tablet 0 10/18/2017 at Unknown time  . lubiprostone (AMITIZA) 24 MCG capsule Take 24 mcg by mouth 2 (two) times daily with a meal.   10/18/2017 at Unknown time  . Melatonin 3 MG TABS Take 3 mg by mouth at bedtime as needed (for sleep).   10/17/2017 at Unknown time  . metFORMIN (GLUCOPHAGE) 500 MG tablet Take 500 mg by mouth 2 (two) times daily with a meal.    10/18/2017 at Unknown time  .  metoprolol tartrate (LOPRESSOR) 25 MG tablet Take 12.5 mg by mouth 2 (two) times daily.    10/18/2017 at 600  . nitroGLYCERIN (NITROSTAT) 0.4 MG SL tablet Place 1 tablet (0.4 mg total) under the tongue every 5 (five) minutes as needed for chest pain. 25 tablet 1 unknown  . omega-3 acid ethyl esters (LOVAZA) 1 G capsule Take 2 g by mouth 2 (two) times daily.    10/18/2017 at Unknown time  . ondansetron (ZOFRAN-ODT) 4 MG disintegrating tablet Take 4 mg by mouth every 8 (eight) hours as needed for nausea or vomiting.   Past Month at Unknown time  . pantoprazole (PROTONIX) 40 MG tablet Take 1 tablet (40 mg total) by mouth daily. 30 tablet 3 10/18/2017 at Unknown time  . potassium chloride (K-DUR,KLOR-CON) 10 MEQ tablet Take 10 mEq by mouth daily.   10/18/2017 at Unknown time  . senna-docusate (SENEXON-S) 8.6-50 MG tablet Take 1 tablet by mouth 2 (two) times daily.   10/18/2017 at Unknown time  . traZODone (DESYREL) 50 MG tablet Take 50 mg by mouth at bedtime.   10/17/2017 at Unknown time   Scheduled: . amLODipine  10 mg Oral Daily  . apixaban  5 mg Oral BID  . aspirin EC  81 mg Oral Daily  . atorvastatin  40 mg Oral QHS  . ferrous sulfate  325 mg Oral Q breakfast  . gabapentin  200 mg Oral QHS  . levETIRAcetam  500 mg Oral BID  . metoprolol tartrate  12.5 mg Oral BID  . omega-3 acid ethyl esters  2 g Oral BID  . pantoprazole  40 mg Oral Daily  . senna-docusate  1 tablet Oral BID  . traZODone  50 mg Oral QHS   Continuous: . sodium chloride 50 mL/hr at 10/23/17 0609  . cefTRIAXone (ROCEPHIN)  IV Stopped (10/22/17 1028)   AOZ:[id number]:acetaminophen **OR** acetaminophen, LORazepam, nitroGLYCERIN, ondansetron **OR** ondansetron (ZOFRAN) IV, senna-docusate  Assesment: She was admitted with urinary tract infection and metabolic encephalopathy from that.  I think she has some dementia at baseline and when she gets sick she gets very confused.  According to her daughter she did not totally recover her mental  status from the last hospitalization.  She is otherwise stable. Principal Problem:   Urinary tract infection Active Problems:   PACEMAKER, PERMANENT   Chronic diastolic heart failure (HCC)   Diabetes mellitus, type II (HCC)   Hypertension   Gastroesophageal reflux disease   Hyperlipidemia   Obesity   Chronic kidney disease (CKD), stage III (moderate) (HCC)   Seizure disorder (HCC)   Spinal stenosis, lumbar region, with neurogenic claudication   Osteoarthritis of left hip   Encephalopathy, metabolic   Atrial fibrillation (HCC)   AKI (acute kidney injury) (HCC)  Plan: Switch to oral antibiotic.  Potential discharge to skilled care facility today    LOS: 5 days   [name] L 10/23/2017, 7:47 AM

## 2017-10-23 NOTE — Clinical Social Work Placement (Signed)
   CLINICAL SOCIAL WORK PLACEMENT  NOTE  Date:  10/23/2017  Patient Details  Name: [name] MRN: [id number] Date of Birth: 20-Oct-1941  Clinical Social Work is seeking post-discharge placement for this patient at the Skilled  Nursing Facility level of care (*CSW will initial, date and re-position this form in  chart as items are completed):  Yes   Patient/family provided with [facility] Clinical Social Work Department's list of facilities offering this level of care within the geographic area requested by the patient (or if unable, by the patient's family).  Yes   Patient/family informed of their freedom to choose among providers that offer the needed level of care, that participate in Medicare, Medicaid or managed care program needed by the patient, have an available bed and are willing to accept the patient.  Yes   Patient/family informed of [facility]'s ownership interest [facility] and [facility], as well as of the fact that they are under no obligation to receive care at these facilities.  PASRR submitted to EDS on       PASRR number received on       Existing PASRR number confirmed on 10/23/17     FL2 transmitted to all facilities in geographic area requested by pt/family on 10/23/17     FL2 transmitted to all facilities within larger geographic area on       Patient informed that his/her managed care company has contracts with or will negotiate with certain facilities, including the following:        Yes   Patient/family informed of bed offers received.  [facility] [place]     Physician recommends and patient chooses bed at      [facility] [place] [facility].  Patient to be transferred to facility by [facility]     Patient family notified on 10/23/17 of transfer.  Name of family member notified:  daughter     PHYSICIAN       Additional Comment:  Discharge clinicals sent to facility  and facility notified.  LCSW signing off.    _______________________________________________ Annice NeedySettle, Kaity Pitstick D, LCSW 10/23/2017, 11:55 AM

## 2017-10-23 NOTE — Care Management (Signed)
[facility] to [facility]. Pt now discharging [facility] [place]. [facility] rep aware pt not going home.

## 2017-10-23 NOTE — Progress Notes (Signed)
EMS on unit to transport [facility] [place].  Pt is alert and disoriented.  Port-a-cath de-accessed and flushed with Heparin per order.  Patient is in stable condition.

## 2017-10-23 NOTE — Progress Notes (Signed)
Attempted to give report to receiving Nurse at [facility] [place].  After being placed on hold for 12 minutes, I asked to have the nurse to call [facility] for report when she was ready.

## 2017-10-24 DIAGNOSIS — I4891 Unspecified atrial fibrillation: Secondary | ICD-10-CM | POA: Diagnosis not present

## 2017-10-24 DIAGNOSIS — I10 Essential (primary) hypertension: Secondary | ICD-10-CM | POA: Diagnosis not present

## 2017-10-24 DIAGNOSIS — I5020 Unspecified systolic (congestive) heart failure: Secondary | ICD-10-CM | POA: Diagnosis not present

## 2017-10-24 DIAGNOSIS — I25118 Atherosclerotic heart disease of native coronary artery with other forms of angina pectoris: Secondary | ICD-10-CM | POA: Diagnosis not present

## 2017-10-24 DIAGNOSIS — E1140 Type 2 diabetes mellitus with diabetic neuropathy, unspecified: Secondary | ICD-10-CM | POA: Diagnosis not present

## 2017-10-24 DIAGNOSIS — N390 Urinary tract infection, site not specified: Secondary | ICD-10-CM | POA: Diagnosis not present

## 2017-10-24 DIAGNOSIS — E118 Type 2 diabetes mellitus with unspecified complications: Secondary | ICD-10-CM | POA: Diagnosis not present

## 2017-10-24 DIAGNOSIS — D649 Anemia, unspecified: Secondary | ICD-10-CM | POA: Diagnosis not present

## 2017-10-24 DIAGNOSIS — M6281 Muscle weakness (generalized): Secondary | ICD-10-CM | POA: Diagnosis not present

## 2017-10-27 DIAGNOSIS — R54 Age-related physical debility: Secondary | ICD-10-CM | POA: Diagnosis not present

## 2017-10-27 DIAGNOSIS — E1140 Type 2 diabetes mellitus with diabetic neuropathy, unspecified: Secondary | ICD-10-CM | POA: Diagnosis not present

## 2017-10-27 DIAGNOSIS — I10 Essential (primary) hypertension: Secondary | ICD-10-CM | POA: Diagnosis not present

## 2017-10-27 DIAGNOSIS — M48061 Spinal stenosis, lumbar region without neurogenic claudication: Secondary | ICD-10-CM | POA: Diagnosis not present

## 2017-10-27 DIAGNOSIS — D649 Anemia, unspecified: Secondary | ICD-10-CM | POA: Diagnosis not present

## 2017-10-27 DIAGNOSIS — I4891 Unspecified atrial fibrillation: Secondary | ICD-10-CM | POA: Diagnosis not present

## 2017-10-27 DIAGNOSIS — M6281 Muscle weakness (generalized): Secondary | ICD-10-CM | POA: Diagnosis not present

## 2017-10-27 DIAGNOSIS — I25118 Atherosclerotic heart disease of native coronary artery with other forms of angina pectoris: Secondary | ICD-10-CM | POA: Diagnosis not present

## 2017-10-27 DIAGNOSIS — I5020 Unspecified systolic (congestive) heart failure: Secondary | ICD-10-CM | POA: Diagnosis not present

## 2017-10-30 DIAGNOSIS — I25118 Atherosclerotic heart disease of native coronary artery with other forms of angina pectoris: Secondary | ICD-10-CM | POA: Diagnosis not present

## 2017-10-30 DIAGNOSIS — D649 Anemia, unspecified: Secondary | ICD-10-CM | POA: Diagnosis not present

## 2017-10-30 DIAGNOSIS — I5020 Unspecified systolic (congestive) heart failure: Secondary | ICD-10-CM | POA: Diagnosis not present

## 2017-10-30 DIAGNOSIS — E1140 Type 2 diabetes mellitus with diabetic neuropathy, unspecified: Secondary | ICD-10-CM | POA: Diagnosis not present

## 2017-10-30 DIAGNOSIS — I4891 Unspecified atrial fibrillation: Secondary | ICD-10-CM | POA: Diagnosis not present

## 2017-10-30 DIAGNOSIS — N390 Urinary tract infection, site not specified: Secondary | ICD-10-CM | POA: Diagnosis not present

## 2017-10-30 DIAGNOSIS — I10 Essential (primary) hypertension: Secondary | ICD-10-CM | POA: Diagnosis not present

## 2017-10-30 DIAGNOSIS — M6281 Muscle weakness (generalized): Secondary | ICD-10-CM | POA: Diagnosis not present

## 2017-10-30 DIAGNOSIS — E118 Type 2 diabetes mellitus with unspecified complications: Secondary | ICD-10-CM | POA: Diagnosis not present

## 2017-11-01 DIAGNOSIS — I5020 Unspecified systolic (congestive) heart failure: Secondary | ICD-10-CM | POA: Diagnosis not present

## 2017-11-01 DIAGNOSIS — I4891 Unspecified atrial fibrillation: Secondary | ICD-10-CM | POA: Diagnosis not present

## 2017-11-01 DIAGNOSIS — M6281 Muscle weakness (generalized): Secondary | ICD-10-CM | POA: Diagnosis not present

## 2017-11-01 DIAGNOSIS — K219 Gastro-esophageal reflux disease without esophagitis: Secondary | ICD-10-CM | POA: Diagnosis not present

## 2017-11-01 DIAGNOSIS — E1140 Type 2 diabetes mellitus with diabetic neuropathy, unspecified: Secondary | ICD-10-CM | POA: Diagnosis not present

## 2017-11-01 DIAGNOSIS — D649 Anemia, unspecified: Secondary | ICD-10-CM | POA: Diagnosis not present

## 2017-11-01 DIAGNOSIS — I25118 Atherosclerotic heart disease of native coronary artery with other forms of angina pectoris: Secondary | ICD-10-CM | POA: Diagnosis not present

## 2017-11-01 DIAGNOSIS — I10 Essential (primary) hypertension: Secondary | ICD-10-CM | POA: Diagnosis not present

## 2017-11-01 DIAGNOSIS — E118 Type 2 diabetes mellitus with unspecified complications: Secondary | ICD-10-CM | POA: Diagnosis not present

## 2017-11-07 DIAGNOSIS — I4891 Unspecified atrial fibrillation: Secondary | ICD-10-CM | POA: Diagnosis not present

## 2017-11-07 DIAGNOSIS — E1140 Type 2 diabetes mellitus with diabetic neuropathy, unspecified: Secondary | ICD-10-CM | POA: Diagnosis not present

## 2017-11-07 DIAGNOSIS — I10 Essential (primary) hypertension: Secondary | ICD-10-CM | POA: Diagnosis not present

## 2017-11-07 DIAGNOSIS — I5020 Unspecified systolic (congestive) heart failure: Secondary | ICD-10-CM | POA: Diagnosis not present

## 2017-11-07 DIAGNOSIS — D649 Anemia, unspecified: Secondary | ICD-10-CM | POA: Diagnosis not present

## 2017-11-07 DIAGNOSIS — E118 Type 2 diabetes mellitus with unspecified complications: Secondary | ICD-10-CM | POA: Diagnosis not present

## 2017-11-07 DIAGNOSIS — M6281 Muscle weakness (generalized): Secondary | ICD-10-CM | POA: Diagnosis not present

## 2017-11-07 DIAGNOSIS — K219 Gastro-esophageal reflux disease without esophagitis: Secondary | ICD-10-CM | POA: Diagnosis not present

## 2017-11-07 DIAGNOSIS — I25118 Atherosclerotic heart disease of native coronary artery with other forms of angina pectoris: Secondary | ICD-10-CM | POA: Diagnosis not present

## 2017-11-10 DIAGNOSIS — N390 Urinary tract infection, site not specified: Secondary | ICD-10-CM | POA: Diagnosis not present

## 2017-11-10 DIAGNOSIS — M6281 Muscle weakness (generalized): Secondary | ICD-10-CM | POA: Diagnosis not present

## 2017-11-10 DIAGNOSIS — N179 Acute kidney failure, unspecified: Secondary | ICD-10-CM | POA: Diagnosis not present

## 2017-11-10 DIAGNOSIS — E119 Type 2 diabetes mellitus without complications: Secondary | ICD-10-CM | POA: Diagnosis not present

## 2017-11-10 DIAGNOSIS — R41841 Cognitive communication deficit: Secondary | ICD-10-CM | POA: Diagnosis not present

## 2017-11-10 DIAGNOSIS — R2689 Other abnormalities of gait and mobility: Secondary | ICD-10-CM | POA: Diagnosis not present

## 2017-11-10 DIAGNOSIS — G9341 Metabolic encephalopathy: Secondary | ICD-10-CM | POA: Diagnosis not present

## 2017-11-10 DIAGNOSIS — Z950 Presence of cardiac pacemaker: Secondary | ICD-10-CM | POA: Diagnosis not present

## 2017-11-14 DIAGNOSIS — N390 Urinary tract infection, site not specified: Secondary | ICD-10-CM | POA: Diagnosis not present

## 2017-11-14 DIAGNOSIS — Z950 Presence of cardiac pacemaker: Secondary | ICD-10-CM | POA: Diagnosis not present

## 2017-11-14 DIAGNOSIS — E119 Type 2 diabetes mellitus without complications: Secondary | ICD-10-CM | POA: Diagnosis not present

## 2017-11-14 DIAGNOSIS — G9341 Metabolic encephalopathy: Secondary | ICD-10-CM | POA: Diagnosis not present

## 2017-11-14 DIAGNOSIS — N179 Acute kidney failure, unspecified: Secondary | ICD-10-CM | POA: Diagnosis not present

## 2017-11-14 DIAGNOSIS — R2689 Other abnormalities of gait and mobility: Secondary | ICD-10-CM | POA: Diagnosis not present

## 2017-11-14 DIAGNOSIS — M6281 Muscle weakness (generalized): Secondary | ICD-10-CM | POA: Diagnosis not present

## 2017-11-14 DIAGNOSIS — R41841 Cognitive communication deficit: Secondary | ICD-10-CM | POA: Diagnosis not present

## 2017-11-15 DIAGNOSIS — M6281 Muscle weakness (generalized): Secondary | ICD-10-CM | POA: Diagnosis not present

## 2017-11-15 DIAGNOSIS — G9341 Metabolic encephalopathy: Secondary | ICD-10-CM | POA: Diagnosis not present

## 2017-11-15 DIAGNOSIS — R41841 Cognitive communication deficit: Secondary | ICD-10-CM | POA: Diagnosis not present

## 2017-11-15 DIAGNOSIS — R2689 Other abnormalities of gait and mobility: Secondary | ICD-10-CM | POA: Diagnosis not present

## 2017-11-15 DIAGNOSIS — E119 Type 2 diabetes mellitus without complications: Secondary | ICD-10-CM | POA: Diagnosis not present

## 2017-11-15 DIAGNOSIS — N390 Urinary tract infection, site not specified: Secondary | ICD-10-CM | POA: Diagnosis not present

## 2017-11-15 DIAGNOSIS — N179 Acute kidney failure, unspecified: Secondary | ICD-10-CM | POA: Diagnosis not present

## 2017-11-15 DIAGNOSIS — Z950 Presence of cardiac pacemaker: Secondary | ICD-10-CM | POA: Diagnosis not present

## 2017-11-16 DIAGNOSIS — M6281 Muscle weakness (generalized): Secondary | ICD-10-CM | POA: Diagnosis not present

## 2017-11-16 DIAGNOSIS — E119 Type 2 diabetes mellitus without complications: Secondary | ICD-10-CM | POA: Diagnosis not present

## 2017-11-16 DIAGNOSIS — Z950 Presence of cardiac pacemaker: Secondary | ICD-10-CM | POA: Diagnosis not present

## 2017-11-16 DIAGNOSIS — N179 Acute kidney failure, unspecified: Secondary | ICD-10-CM | POA: Diagnosis not present

## 2017-11-16 DIAGNOSIS — R41841 Cognitive communication deficit: Secondary | ICD-10-CM | POA: Diagnosis not present

## 2017-11-16 DIAGNOSIS — N390 Urinary tract infection, site not specified: Secondary | ICD-10-CM | POA: Diagnosis not present

## 2017-11-16 DIAGNOSIS — R2689 Other abnormalities of gait and mobility: Secondary | ICD-10-CM | POA: Diagnosis not present

## 2017-11-16 DIAGNOSIS — G9341 Metabolic encephalopathy: Secondary | ICD-10-CM | POA: Diagnosis not present

## 2017-11-17 DIAGNOSIS — N390 Urinary tract infection, site not specified: Secondary | ICD-10-CM | POA: Diagnosis not present

## 2017-11-17 DIAGNOSIS — M6281 Muscle weakness (generalized): Secondary | ICD-10-CM | POA: Diagnosis not present

## 2017-11-17 DIAGNOSIS — G9341 Metabolic encephalopathy: Secondary | ICD-10-CM | POA: Diagnosis not present

## 2017-11-17 DIAGNOSIS — N179 Acute kidney failure, unspecified: Secondary | ICD-10-CM | POA: Diagnosis not present

## 2017-11-17 DIAGNOSIS — Z950 Presence of cardiac pacemaker: Secondary | ICD-10-CM | POA: Diagnosis not present

## 2017-11-17 DIAGNOSIS — R41841 Cognitive communication deficit: Secondary | ICD-10-CM | POA: Diagnosis not present

## 2017-11-17 DIAGNOSIS — R2689 Other abnormalities of gait and mobility: Secondary | ICD-10-CM | POA: Diagnosis not present

## 2017-11-17 DIAGNOSIS — E119 Type 2 diabetes mellitus without complications: Secondary | ICD-10-CM | POA: Diagnosis not present

## 2017-11-20 DIAGNOSIS — M6281 Muscle weakness (generalized): Secondary | ICD-10-CM | POA: Diagnosis not present

## 2017-11-20 DIAGNOSIS — N390 Urinary tract infection, site not specified: Secondary | ICD-10-CM | POA: Diagnosis not present

## 2017-11-20 DIAGNOSIS — R41841 Cognitive communication deficit: Secondary | ICD-10-CM | POA: Diagnosis not present

## 2017-11-20 DIAGNOSIS — N179 Acute kidney failure, unspecified: Secondary | ICD-10-CM | POA: Diagnosis not present

## 2017-11-20 DIAGNOSIS — Z950 Presence of cardiac pacemaker: Secondary | ICD-10-CM | POA: Diagnosis not present

## 2017-11-20 DIAGNOSIS — R2689 Other abnormalities of gait and mobility: Secondary | ICD-10-CM | POA: Diagnosis not present

## 2017-11-20 DIAGNOSIS — E119 Type 2 diabetes mellitus without complications: Secondary | ICD-10-CM | POA: Diagnosis not present

## 2017-11-20 DIAGNOSIS — G9341 Metabolic encephalopathy: Secondary | ICD-10-CM | POA: Diagnosis not present

## 2017-11-24 DIAGNOSIS — R2689 Other abnormalities of gait and mobility: Secondary | ICD-10-CM | POA: Diagnosis not present

## 2017-11-24 DIAGNOSIS — N390 Urinary tract infection, site not specified: Secondary | ICD-10-CM | POA: Diagnosis not present

## 2017-11-24 DIAGNOSIS — R41841 Cognitive communication deficit: Secondary | ICD-10-CM | POA: Diagnosis not present

## 2017-11-24 DIAGNOSIS — N179 Acute kidney failure, unspecified: Secondary | ICD-10-CM | POA: Diagnosis not present

## 2017-11-24 DIAGNOSIS — G9341 Metabolic encephalopathy: Secondary | ICD-10-CM | POA: Diagnosis not present

## 2017-11-24 DIAGNOSIS — Z950 Presence of cardiac pacemaker: Secondary | ICD-10-CM | POA: Diagnosis not present

## 2017-11-24 DIAGNOSIS — M6281 Muscle weakness (generalized): Secondary | ICD-10-CM | POA: Diagnosis not present

## 2017-11-24 DIAGNOSIS — E119 Type 2 diabetes mellitus without complications: Secondary | ICD-10-CM | POA: Diagnosis not present

## 2017-11-25 DIAGNOSIS — Z950 Presence of cardiac pacemaker: Secondary | ICD-10-CM | POA: Diagnosis not present

## 2017-11-25 DIAGNOSIS — G9341 Metabolic encephalopathy: Secondary | ICD-10-CM | POA: Diagnosis not present

## 2017-11-25 DIAGNOSIS — N390 Urinary tract infection, site not specified: Secondary | ICD-10-CM | POA: Diagnosis not present

## 2017-11-25 DIAGNOSIS — E119 Type 2 diabetes mellitus without complications: Secondary | ICD-10-CM | POA: Diagnosis not present

## 2017-11-25 DIAGNOSIS — R2689 Other abnormalities of gait and mobility: Secondary | ICD-10-CM | POA: Diagnosis not present

## 2017-11-25 DIAGNOSIS — N179 Acute kidney failure, unspecified: Secondary | ICD-10-CM | POA: Diagnosis not present

## 2017-11-25 DIAGNOSIS — M6281 Muscle weakness (generalized): Secondary | ICD-10-CM | POA: Diagnosis not present

## 2017-11-25 DIAGNOSIS — R41841 Cognitive communication deficit: Secondary | ICD-10-CM | POA: Diagnosis not present

## 2017-11-27 DIAGNOSIS — N390 Urinary tract infection, site not specified: Secondary | ICD-10-CM | POA: Diagnosis not present

## 2017-11-27 DIAGNOSIS — R41841 Cognitive communication deficit: Secondary | ICD-10-CM | POA: Diagnosis not present

## 2017-11-27 DIAGNOSIS — N179 Acute kidney failure, unspecified: Secondary | ICD-10-CM | POA: Diagnosis not present

## 2017-11-27 DIAGNOSIS — E119 Type 2 diabetes mellitus without complications: Secondary | ICD-10-CM | POA: Diagnosis not present

## 2017-11-27 DIAGNOSIS — M6281 Muscle weakness (generalized): Secondary | ICD-10-CM | POA: Diagnosis not present

## 2017-11-27 DIAGNOSIS — G9341 Metabolic encephalopathy: Secondary | ICD-10-CM | POA: Diagnosis not present

## 2017-11-27 DIAGNOSIS — Z950 Presence of cardiac pacemaker: Secondary | ICD-10-CM | POA: Diagnosis not present

## 2017-11-27 DIAGNOSIS — R2689 Other abnormalities of gait and mobility: Secondary | ICD-10-CM | POA: Diagnosis not present

## 2017-11-29 DIAGNOSIS — N179 Acute kidney failure, unspecified: Secondary | ICD-10-CM | POA: Diagnosis not present

## 2017-11-29 DIAGNOSIS — E119 Type 2 diabetes mellitus without complications: Secondary | ICD-10-CM | POA: Diagnosis not present

## 2017-11-29 DIAGNOSIS — Z950 Presence of cardiac pacemaker: Secondary | ICD-10-CM | POA: Diagnosis not present

## 2017-11-29 DIAGNOSIS — M6281 Muscle weakness (generalized): Secondary | ICD-10-CM | POA: Diagnosis not present

## 2017-11-29 DIAGNOSIS — G9341 Metabolic encephalopathy: Secondary | ICD-10-CM | POA: Diagnosis not present

## 2017-11-29 DIAGNOSIS — R2689 Other abnormalities of gait and mobility: Secondary | ICD-10-CM | POA: Diagnosis not present

## 2017-11-29 DIAGNOSIS — N390 Urinary tract infection, site not specified: Secondary | ICD-10-CM | POA: Diagnosis not present

## 2017-11-29 DIAGNOSIS — R41841 Cognitive communication deficit: Secondary | ICD-10-CM | POA: Diagnosis not present

## 2017-11-30 DIAGNOSIS — G9341 Metabolic encephalopathy: Secondary | ICD-10-CM | POA: Diagnosis not present

## 2017-11-30 DIAGNOSIS — Z950 Presence of cardiac pacemaker: Secondary | ICD-10-CM | POA: Diagnosis not present

## 2017-11-30 DIAGNOSIS — R41841 Cognitive communication deficit: Secondary | ICD-10-CM | POA: Diagnosis not present

## 2017-11-30 DIAGNOSIS — N179 Acute kidney failure, unspecified: Secondary | ICD-10-CM | POA: Diagnosis not present

## 2017-11-30 DIAGNOSIS — N390 Urinary tract infection, site not specified: Secondary | ICD-10-CM | POA: Diagnosis not present

## 2017-11-30 DIAGNOSIS — R2689 Other abnormalities of gait and mobility: Secondary | ICD-10-CM | POA: Diagnosis not present

## 2017-11-30 DIAGNOSIS — M6281 Muscle weakness (generalized): Secondary | ICD-10-CM | POA: Diagnosis not present

## 2017-11-30 DIAGNOSIS — E119 Type 2 diabetes mellitus without complications: Secondary | ICD-10-CM | POA: Diagnosis not present

## 2017-12-01 DIAGNOSIS — R2689 Other abnormalities of gait and mobility: Secondary | ICD-10-CM | POA: Diagnosis not present

## 2017-12-01 DIAGNOSIS — M6281 Muscle weakness (generalized): Secondary | ICD-10-CM | POA: Diagnosis not present

## 2017-12-01 DIAGNOSIS — N390 Urinary tract infection, site not specified: Secondary | ICD-10-CM | POA: Diagnosis not present

## 2017-12-01 DIAGNOSIS — E119 Type 2 diabetes mellitus without complications: Secondary | ICD-10-CM | POA: Diagnosis not present

## 2017-12-01 DIAGNOSIS — R41841 Cognitive communication deficit: Secondary | ICD-10-CM | POA: Diagnosis not present

## 2017-12-01 DIAGNOSIS — Z950 Presence of cardiac pacemaker: Secondary | ICD-10-CM | POA: Diagnosis not present

## 2017-12-01 DIAGNOSIS — G9341 Metabolic encephalopathy: Secondary | ICD-10-CM | POA: Diagnosis not present

## 2017-12-01 DIAGNOSIS — N179 Acute kidney failure, unspecified: Secondary | ICD-10-CM | POA: Diagnosis not present

## 2017-12-02 DIAGNOSIS — R41841 Cognitive communication deficit: Secondary | ICD-10-CM | POA: Diagnosis not present

## 2017-12-02 DIAGNOSIS — G9341 Metabolic encephalopathy: Secondary | ICD-10-CM | POA: Diagnosis not present

## 2017-12-02 DIAGNOSIS — N390 Urinary tract infection, site not specified: Secondary | ICD-10-CM | POA: Diagnosis not present

## 2017-12-02 DIAGNOSIS — N179 Acute kidney failure, unspecified: Secondary | ICD-10-CM | POA: Diagnosis not present

## 2017-12-02 DIAGNOSIS — E119 Type 2 diabetes mellitus without complications: Secondary | ICD-10-CM | POA: Diagnosis not present

## 2017-12-02 DIAGNOSIS — R2689 Other abnormalities of gait and mobility: Secondary | ICD-10-CM | POA: Diagnosis not present

## 2017-12-02 DIAGNOSIS — Z950 Presence of cardiac pacemaker: Secondary | ICD-10-CM | POA: Diagnosis not present

## 2017-12-02 DIAGNOSIS — M6281 Muscle weakness (generalized): Secondary | ICD-10-CM | POA: Diagnosis not present

## 2017-12-05 DIAGNOSIS — G9341 Metabolic encephalopathy: Secondary | ICD-10-CM | POA: Diagnosis not present

## 2017-12-05 DIAGNOSIS — Z950 Presence of cardiac pacemaker: Secondary | ICD-10-CM | POA: Diagnosis not present

## 2017-12-05 DIAGNOSIS — M6281 Muscle weakness (generalized): Secondary | ICD-10-CM | POA: Diagnosis not present

## 2017-12-05 DIAGNOSIS — R41841 Cognitive communication deficit: Secondary | ICD-10-CM | POA: Diagnosis not present

## 2017-12-05 DIAGNOSIS — R2689 Other abnormalities of gait and mobility: Secondary | ICD-10-CM | POA: Diagnosis not present

## 2017-12-05 DIAGNOSIS — N179 Acute kidney failure, unspecified: Secondary | ICD-10-CM | POA: Diagnosis not present

## 2017-12-05 DIAGNOSIS — E119 Type 2 diabetes mellitus without complications: Secondary | ICD-10-CM | POA: Diagnosis not present

## 2017-12-05 DIAGNOSIS — N390 Urinary tract infection, site not specified: Secondary | ICD-10-CM | POA: Diagnosis not present

## 2017-12-06 DIAGNOSIS — G9341 Metabolic encephalopathy: Secondary | ICD-10-CM | POA: Diagnosis not present

## 2017-12-06 DIAGNOSIS — Z950 Presence of cardiac pacemaker: Secondary | ICD-10-CM | POA: Diagnosis not present

## 2017-12-06 DIAGNOSIS — M6281 Muscle weakness (generalized): Secondary | ICD-10-CM | POA: Diagnosis not present

## 2017-12-06 DIAGNOSIS — E119 Type 2 diabetes mellitus without complications: Secondary | ICD-10-CM | POA: Diagnosis not present

## 2017-12-06 DIAGNOSIS — R2689 Other abnormalities of gait and mobility: Secondary | ICD-10-CM | POA: Diagnosis not present

## 2017-12-06 DIAGNOSIS — N390 Urinary tract infection, site not specified: Secondary | ICD-10-CM | POA: Diagnosis not present

## 2017-12-06 DIAGNOSIS — N179 Acute kidney failure, unspecified: Secondary | ICD-10-CM | POA: Diagnosis not present

## 2017-12-06 DIAGNOSIS — R41841 Cognitive communication deficit: Secondary | ICD-10-CM | POA: Diagnosis not present

## 2017-12-07 DIAGNOSIS — E119 Type 2 diabetes mellitus without complications: Secondary | ICD-10-CM | POA: Diagnosis not present

## 2017-12-07 DIAGNOSIS — M6281 Muscle weakness (generalized): Secondary | ICD-10-CM | POA: Diagnosis not present

## 2017-12-07 DIAGNOSIS — N390 Urinary tract infection, site not specified: Secondary | ICD-10-CM | POA: Diagnosis not present

## 2017-12-07 DIAGNOSIS — R41841 Cognitive communication deficit: Secondary | ICD-10-CM | POA: Diagnosis not present

## 2017-12-07 DIAGNOSIS — Z950 Presence of cardiac pacemaker: Secondary | ICD-10-CM | POA: Diagnosis not present

## 2017-12-07 DIAGNOSIS — R2689 Other abnormalities of gait and mobility: Secondary | ICD-10-CM | POA: Diagnosis not present

## 2017-12-07 DIAGNOSIS — G9341 Metabolic encephalopathy: Secondary | ICD-10-CM | POA: Diagnosis not present

## 2017-12-07 DIAGNOSIS — N179 Acute kidney failure, unspecified: Secondary | ICD-10-CM | POA: Diagnosis not present

## 2017-12-08 DIAGNOSIS — G9341 Metabolic encephalopathy: Secondary | ICD-10-CM | POA: Diagnosis not present

## 2017-12-08 DIAGNOSIS — N390 Urinary tract infection, site not specified: Secondary | ICD-10-CM | POA: Diagnosis not present

## 2017-12-08 DIAGNOSIS — M6281 Muscle weakness (generalized): Secondary | ICD-10-CM | POA: Diagnosis not present

## 2017-12-08 DIAGNOSIS — Z950 Presence of cardiac pacemaker: Secondary | ICD-10-CM | POA: Diagnosis not present

## 2017-12-08 DIAGNOSIS — R2689 Other abnormalities of gait and mobility: Secondary | ICD-10-CM | POA: Diagnosis not present

## 2017-12-08 DIAGNOSIS — R41841 Cognitive communication deficit: Secondary | ICD-10-CM | POA: Diagnosis not present

## 2017-12-08 DIAGNOSIS — N179 Acute kidney failure, unspecified: Secondary | ICD-10-CM | POA: Diagnosis not present

## 2017-12-08 DIAGNOSIS — E119 Type 2 diabetes mellitus without complications: Secondary | ICD-10-CM | POA: Diagnosis not present

## 2017-12-09 DIAGNOSIS — M6281 Muscle weakness (generalized): Secondary | ICD-10-CM | POA: Diagnosis not present

## 2017-12-09 DIAGNOSIS — N179 Acute kidney failure, unspecified: Secondary | ICD-10-CM | POA: Diagnosis not present

## 2017-12-09 DIAGNOSIS — R41841 Cognitive communication deficit: Secondary | ICD-10-CM | POA: Diagnosis not present

## 2017-12-09 DIAGNOSIS — E119 Type 2 diabetes mellitus without complications: Secondary | ICD-10-CM | POA: Diagnosis not present

## 2017-12-09 DIAGNOSIS — G9341 Metabolic encephalopathy: Secondary | ICD-10-CM | POA: Diagnosis not present

## 2017-12-09 DIAGNOSIS — R2689 Other abnormalities of gait and mobility: Secondary | ICD-10-CM | POA: Diagnosis not present

## 2017-12-09 DIAGNOSIS — Z950 Presence of cardiac pacemaker: Secondary | ICD-10-CM | POA: Diagnosis not present

## 2017-12-09 DIAGNOSIS — N390 Urinary tract infection, site not specified: Secondary | ICD-10-CM | POA: Diagnosis not present

## 2017-12-19 DIAGNOSIS — M19072 Primary osteoarthritis, left ankle and foot: Secondary | ICD-10-CM | POA: Diagnosis not present

## 2018-01-07 DIAGNOSIS — R002 Palpitations: Secondary | ICD-10-CM | POA: Diagnosis not present

## 2018-01-25 DIAGNOSIS — I25118 Atherosclerotic heart disease of native coronary artery with other forms of angina pectoris: Secondary | ICD-10-CM | POA: Diagnosis not present

## 2018-01-25 DIAGNOSIS — D649 Anemia, unspecified: Secondary | ICD-10-CM | POA: Diagnosis not present

## 2018-01-25 DIAGNOSIS — E1140 Type 2 diabetes mellitus with diabetic neuropathy, unspecified: Secondary | ICD-10-CM | POA: Diagnosis not present

## 2018-01-25 DIAGNOSIS — I10 Essential (primary) hypertension: Secondary | ICD-10-CM | POA: Diagnosis not present

## 2018-01-25 DIAGNOSIS — R54 Age-related physical debility: Secondary | ICD-10-CM | POA: Diagnosis not present

## 2018-01-25 DIAGNOSIS — I4891 Unspecified atrial fibrillation: Secondary | ICD-10-CM | POA: Diagnosis not present

## 2018-01-25 DIAGNOSIS — K219 Gastro-esophageal reflux disease without esophagitis: Secondary | ICD-10-CM | POA: Diagnosis not present

## 2018-01-25 DIAGNOSIS — I5020 Unspecified systolic (congestive) heart failure: Secondary | ICD-10-CM | POA: Diagnosis not present

## 2018-01-25 DIAGNOSIS — M6281 Muscle weakness (generalized): Secondary | ICD-10-CM | POA: Diagnosis not present

## 2018-02-05 IMAGING — DX DG CHEST 2V
2 series · 2 of 2 positions shown · non-contrast
Comparison: 02/12/2015

CLINICAL DATA: Allergic reaction to peanuts with abdominal pain and
vomiting.

EXAM:
CHEST  2 VIEW

[chest lat]
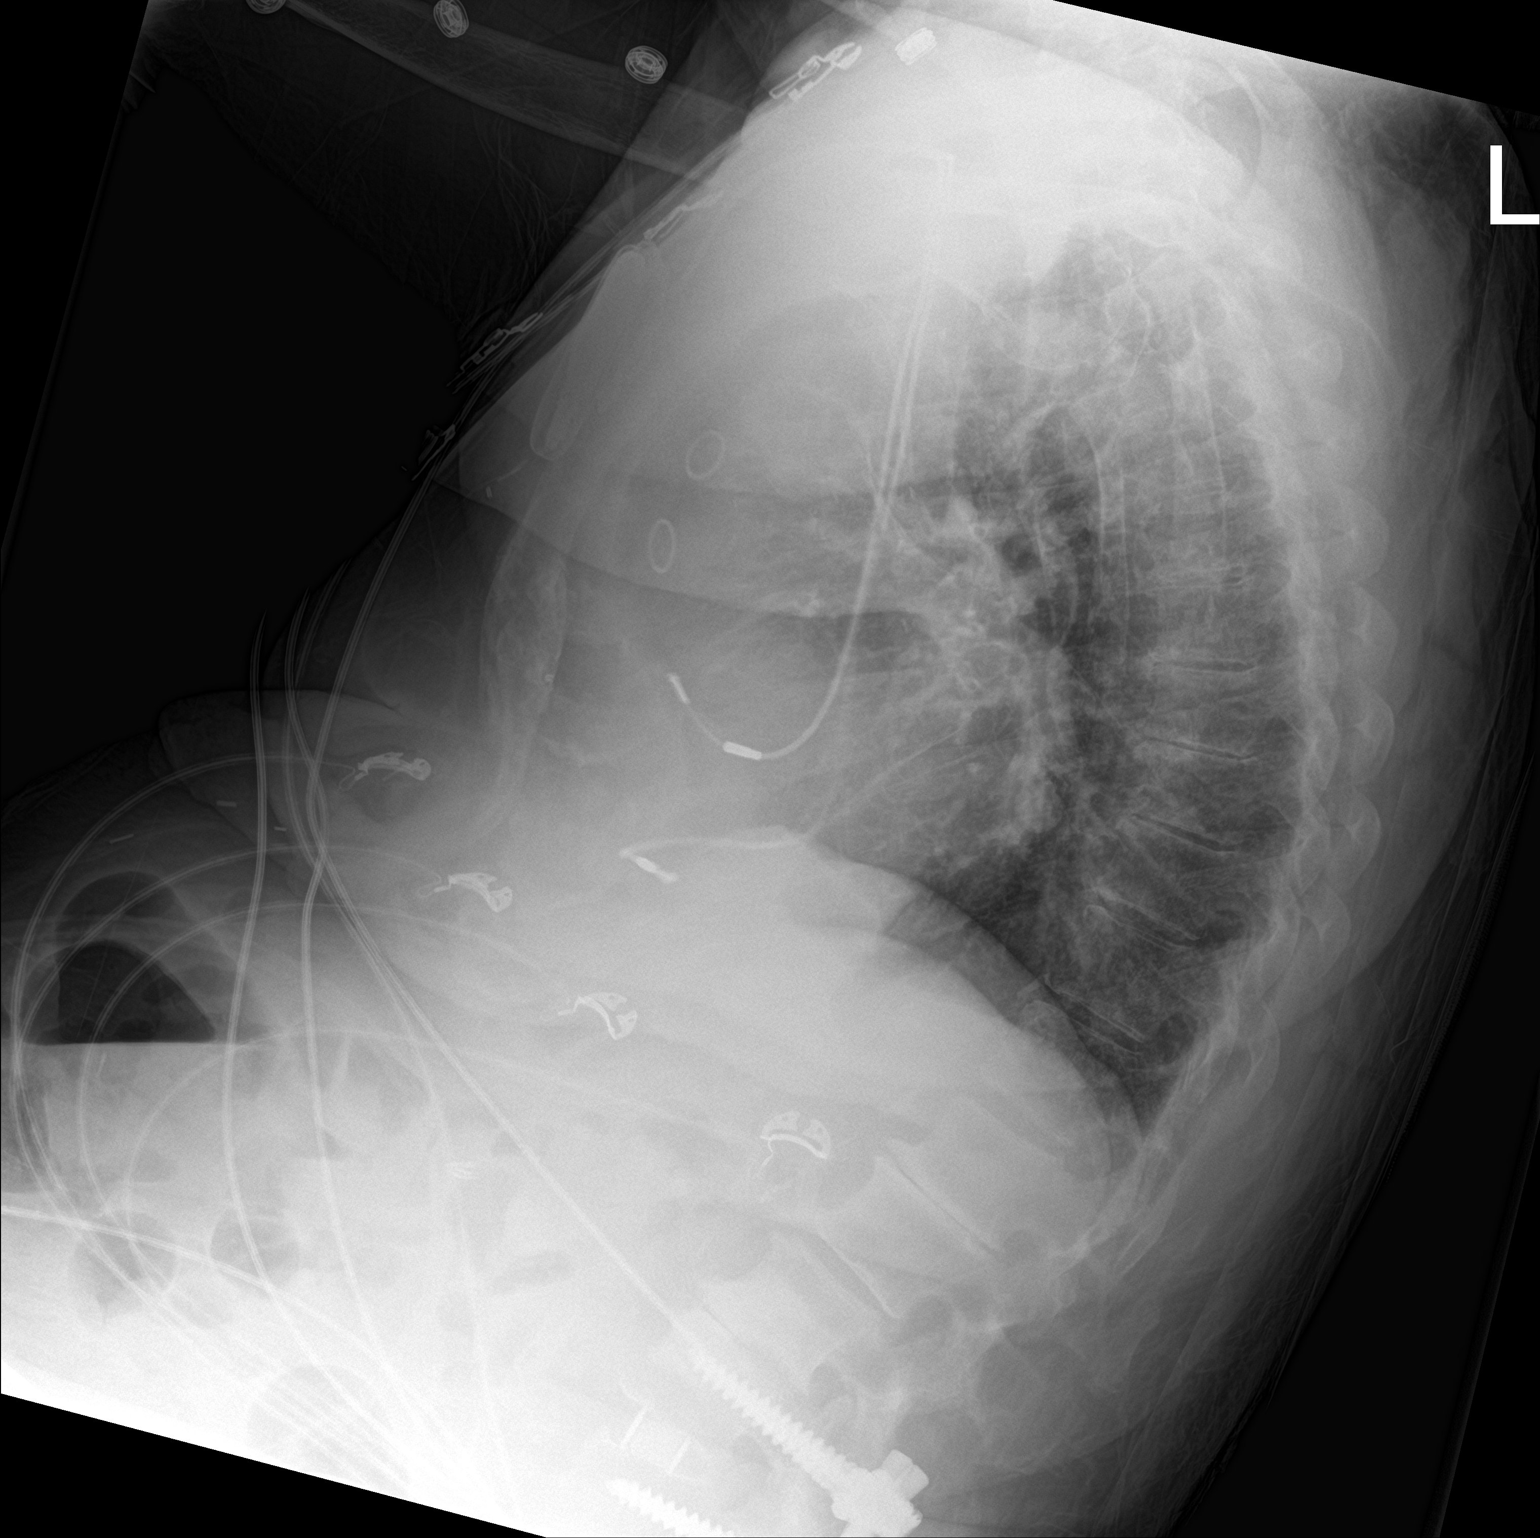

[chest ap]
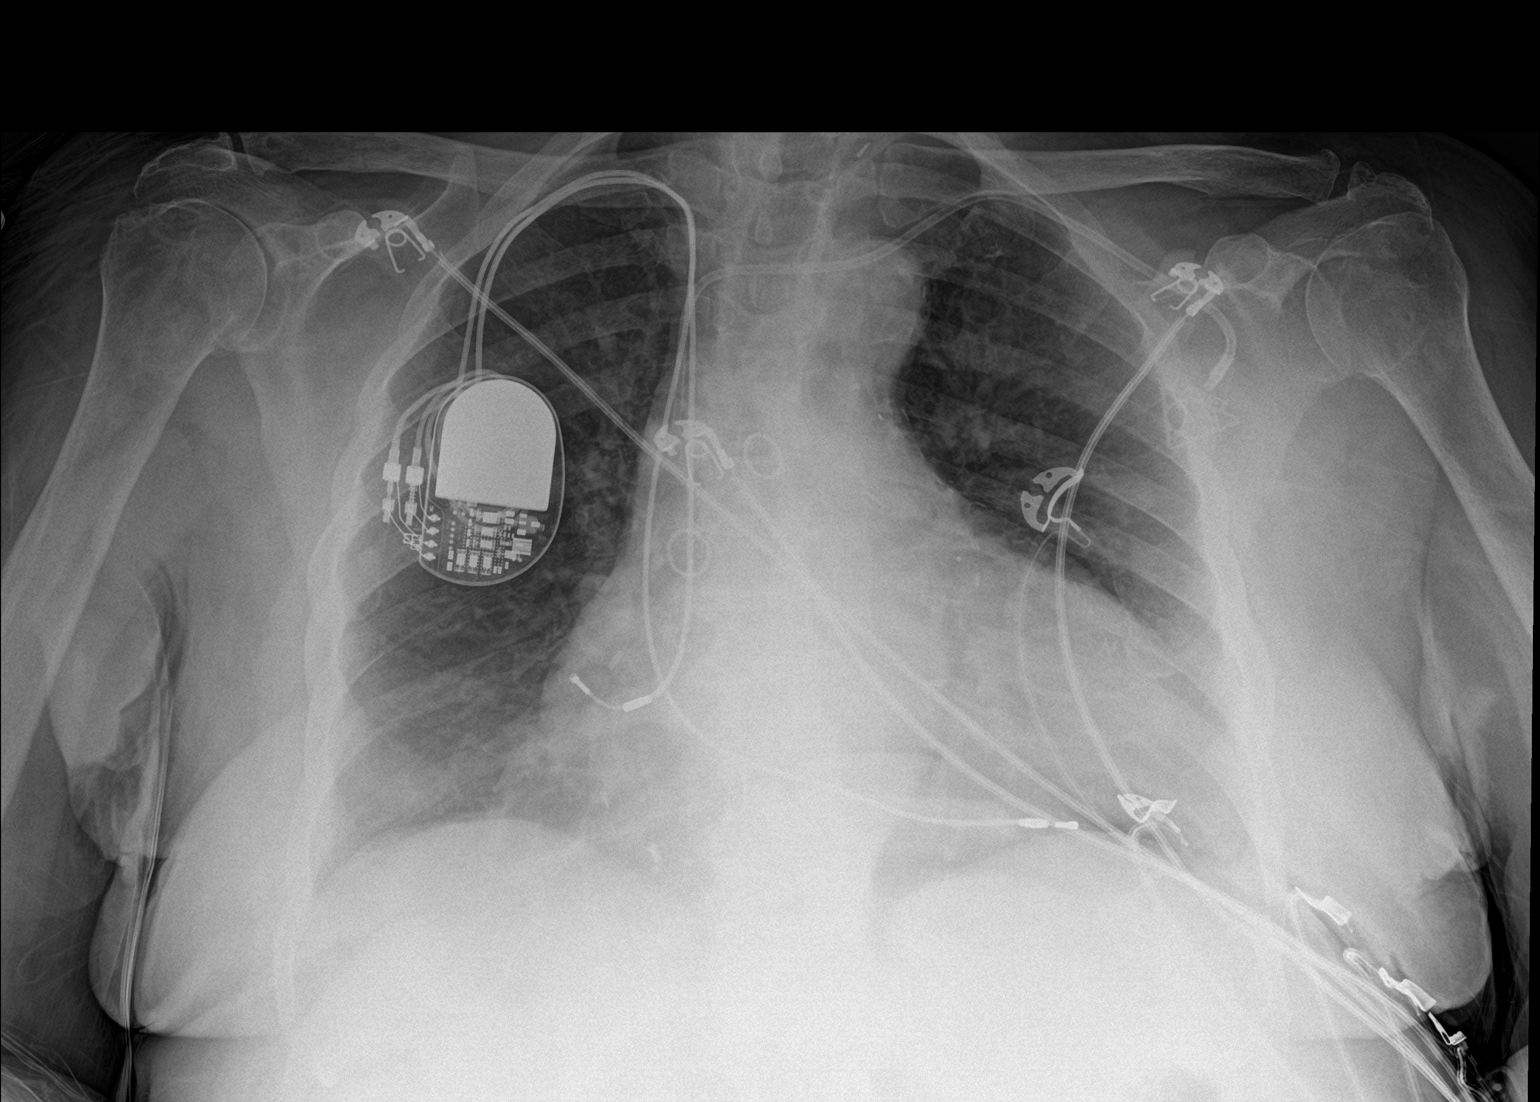

[2 of 2 positions shown; findings below may reference images not displayed]

FINDINGS: Previous CABG. Dual lead pacemaker well positioned. Power port from
a left subclavian approach has its tip in the SVC. Chronic
cardiomegaly. Chronic aortic atherosclerosis. The lungs are clear.
The vascularity is normal. No effusions. No acute bone finding.
IMPRESSION: No active cardiopulmonary disease. Previous CABG. Pacemaker.
Cardiomegaly. Power port.

## 2018-02-25 IMAGING — DX DG CHEST 2V
2 series · 2 of 2 positions shown · non-contrast
Comparison: 08/09/2016 and 02/12/2015.

CLINICAL DATA: Severe substernal chest pain today with shortness of
breath and diaphoresis.

EXAM:
CHEST  2 VIEW

[chest pa]
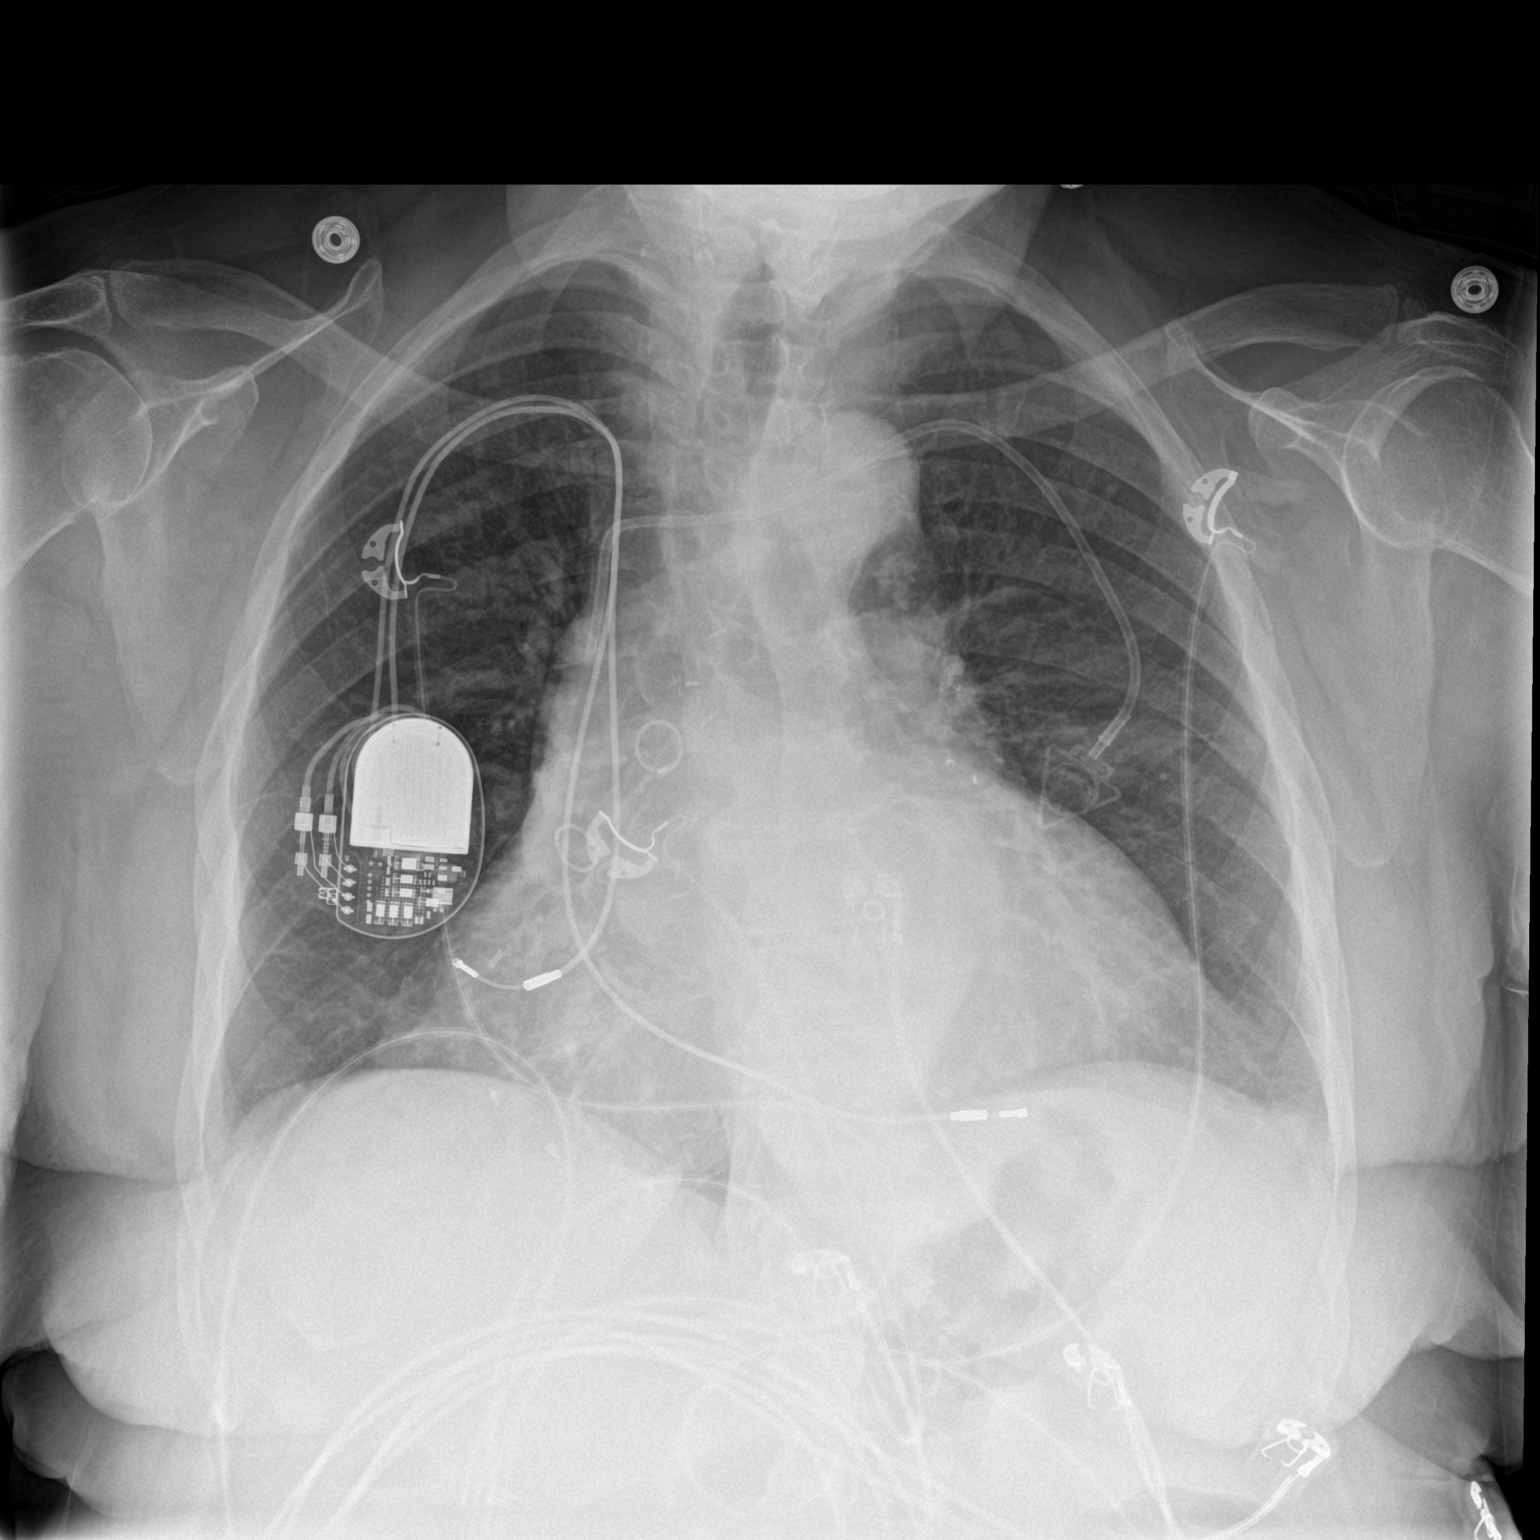

[chest lat]
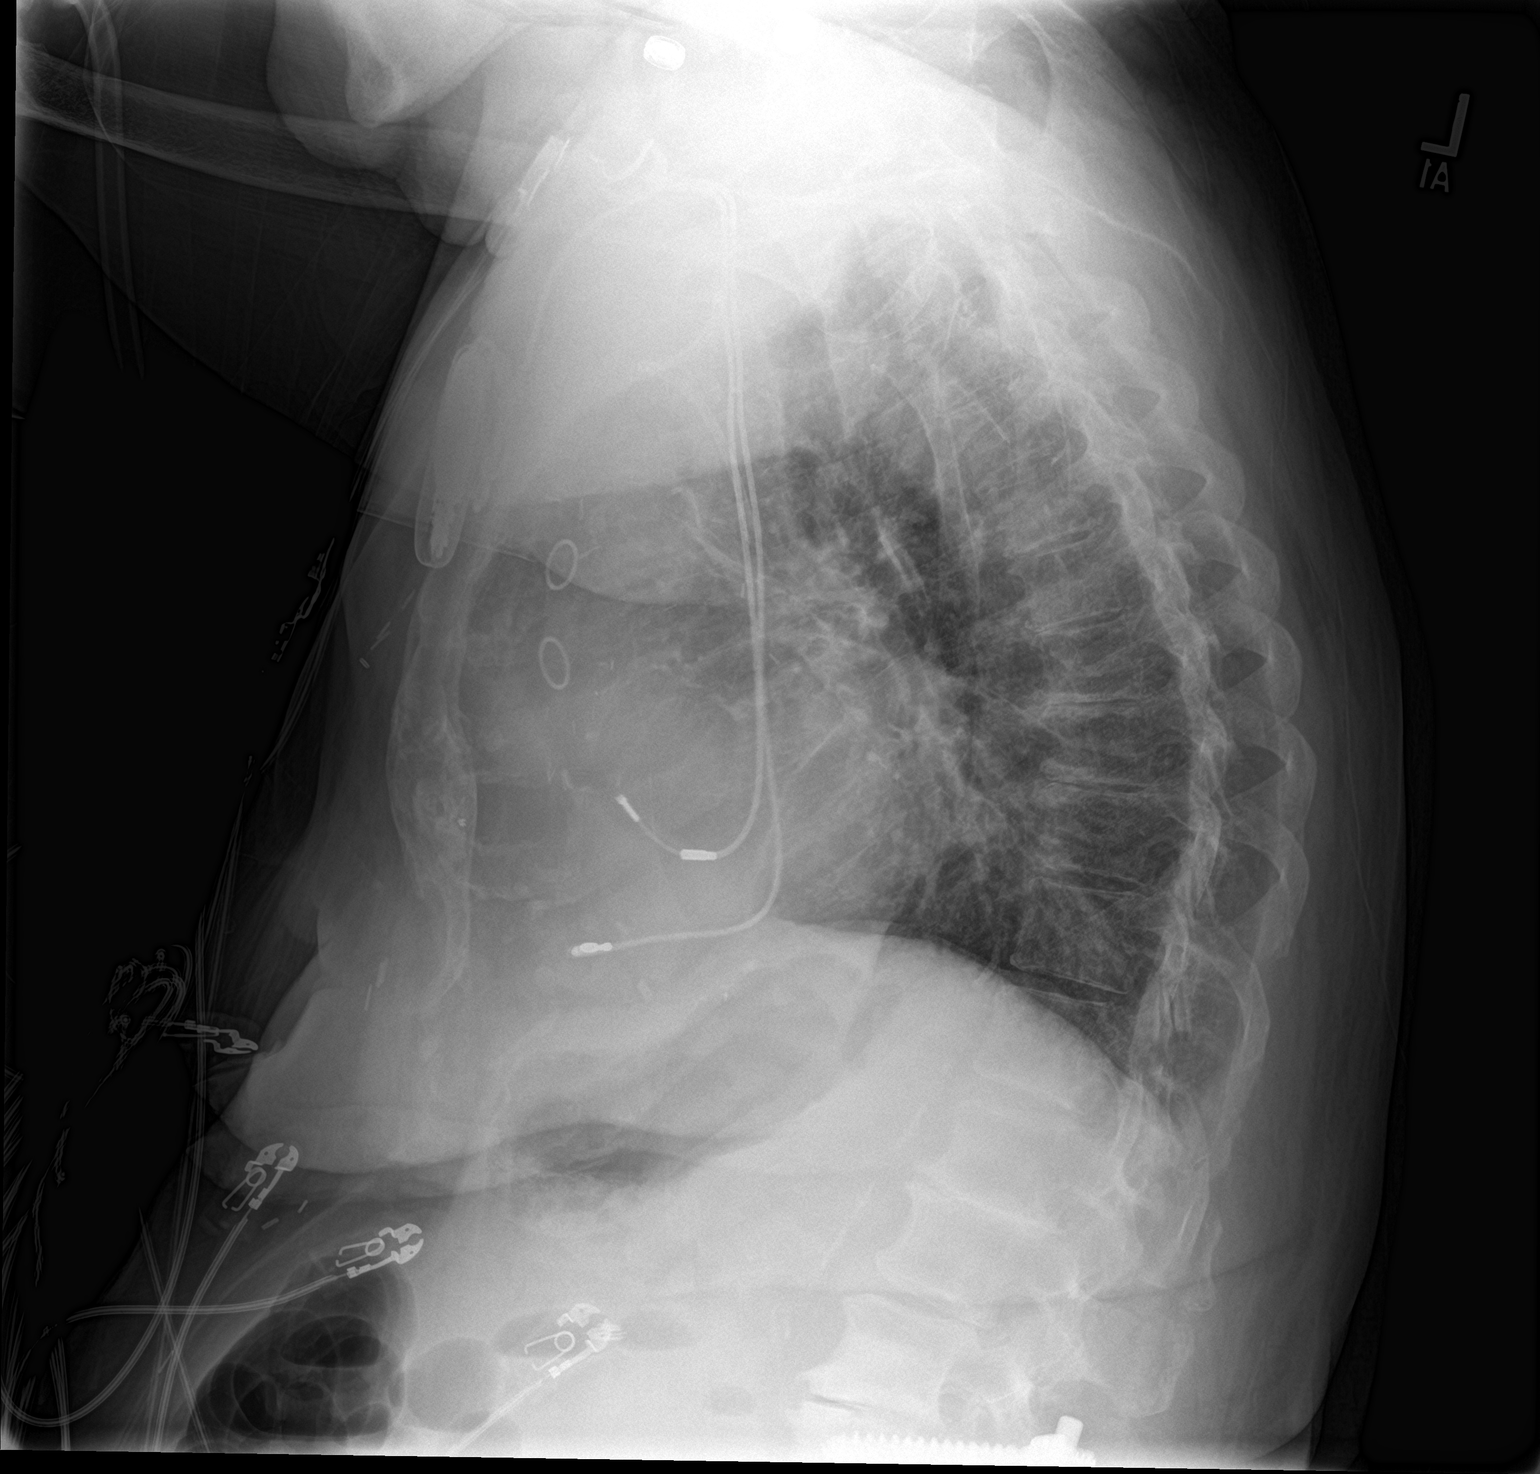

[2 of 2 positions shown; findings below may reference images not displayed]

FINDINGS: Stable cardiomegaly and aortic atherosclerosis post CABG. Left
subclavian Port-A-Cath tip is unchanged in the mid SVC. There are
right subclavian pacemaker leads in the right atrium and right
ventricle. The lungs are clear. There is no pleural effusion or
pneumothorax. No acute osseous findings are seen. There are mild
degenerative changes throughout the spine associated with a
scoliosis.
IMPRESSION: Stable postoperative chest.  Mild cardiomegaly post CABG.

## 2018-02-28 DIAGNOSIS — E1140 Type 2 diabetes mellitus with diabetic neuropathy, unspecified: Secondary | ICD-10-CM | POA: Diagnosis not present

## 2018-02-28 DIAGNOSIS — I25118 Atherosclerotic heart disease of native coronary artery with other forms of angina pectoris: Secondary | ICD-10-CM | POA: Diagnosis not present

## 2018-02-28 DIAGNOSIS — D649 Anemia, unspecified: Secondary | ICD-10-CM | POA: Diagnosis not present

## 2018-02-28 DIAGNOSIS — I4891 Unspecified atrial fibrillation: Secondary | ICD-10-CM | POA: Diagnosis not present

## 2018-02-28 DIAGNOSIS — I5020 Unspecified systolic (congestive) heart failure: Secondary | ICD-10-CM | POA: Diagnosis not present

## 2018-02-28 DIAGNOSIS — R54 Age-related physical debility: Secondary | ICD-10-CM | POA: Diagnosis not present

## 2018-02-28 DIAGNOSIS — M6281 Muscle weakness (generalized): Secondary | ICD-10-CM | POA: Diagnosis not present

## 2018-02-28 DIAGNOSIS — I10 Essential (primary) hypertension: Secondary | ICD-10-CM | POA: Diagnosis not present

## 2018-02-28 DIAGNOSIS — H1031 Unspecified acute conjunctivitis, right eye: Secondary | ICD-10-CM | POA: Diagnosis not present

## 2018-03-06 DIAGNOSIS — Z79899 Other long term (current) drug therapy: Secondary | ICD-10-CM | POA: Diagnosis not present

## 2018-03-06 DIAGNOSIS — R319 Hematuria, unspecified: Secondary | ICD-10-CM | POA: Diagnosis not present

## 2018-03-08 DIAGNOSIS — J9811 Atelectasis: Secondary | ICD-10-CM | POA: Diagnosis not present

## 2018-03-08 DIAGNOSIS — J182 Hypostatic pneumonia, unspecified organism: Secondary | ICD-10-CM | POA: Diagnosis not present

## 2018-03-08 DIAGNOSIS — I517 Cardiomegaly: Secondary | ICD-10-CM | POA: Diagnosis not present

## 2018-03-10 DIAGNOSIS — D649 Anemia, unspecified: Secondary | ICD-10-CM | POA: Diagnosis not present

## 2018-03-10 DIAGNOSIS — I10 Essential (primary) hypertension: Secondary | ICD-10-CM | POA: Diagnosis not present

## 2018-03-19 DIAGNOSIS — R319 Hematuria, unspecified: Secondary | ICD-10-CM | POA: Diagnosis not present

## 2018-03-19 DIAGNOSIS — N390 Urinary tract infection, site not specified: Secondary | ICD-10-CM | POA: Diagnosis not present

## 2018-03-19 DIAGNOSIS — Z79899 Other long term (current) drug therapy: Secondary | ICD-10-CM | POA: Diagnosis not present

## 2018-03-23 IMAGING — CT CT HEAD W/O CM
3 series · 16 of 46 positions shown, 19 images · non-contrast
Comparison: CT from 07/22/2013

CLINICAL DATA: Patient fell twice.  Confused.

EXAM:
CT HEAD WITHOUT CONTRAST
TECHNIQUE: Contiguous axial images were obtained from the base of the skull
through the vertex without intravenous contrast.

[Series 2: head trauma wo · axial · 0.39mm/px · z∈[+1763,+1883]mm · 10 of 29 slices shown, 13 images]
[im 3/29  brain]
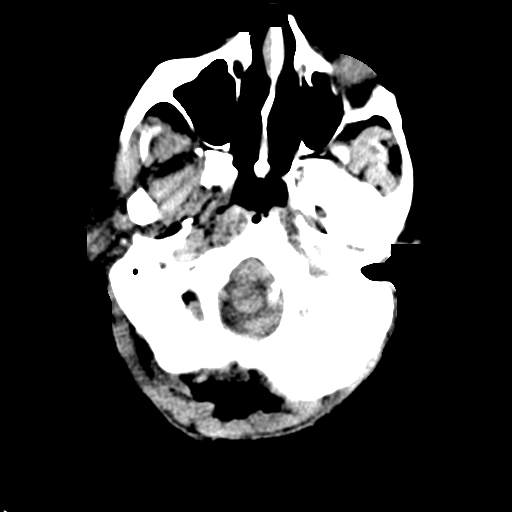
[im 3/29  bone]
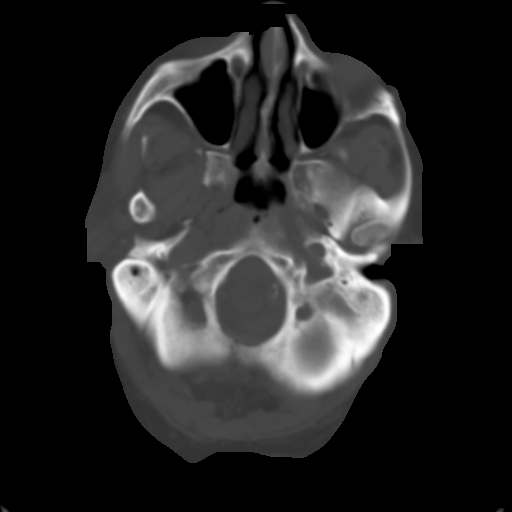
[im 6/29  brain]
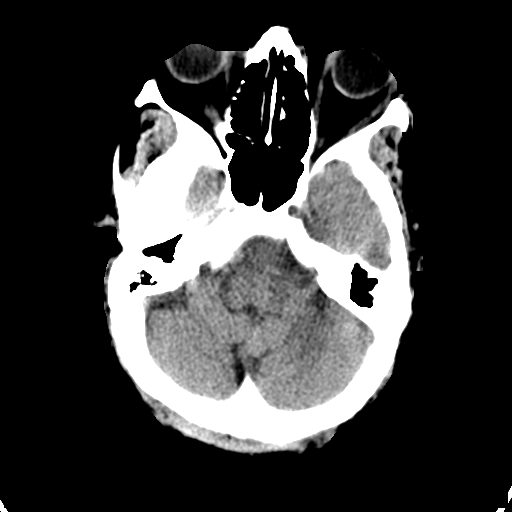
[im 8/29  brain]
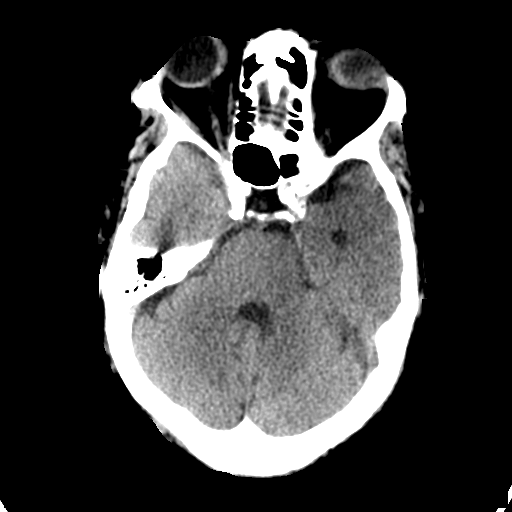
[im 11/29  brain]
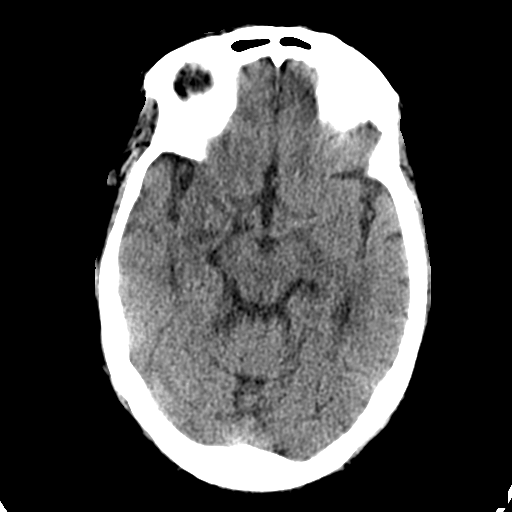
[im 14/29  brain]
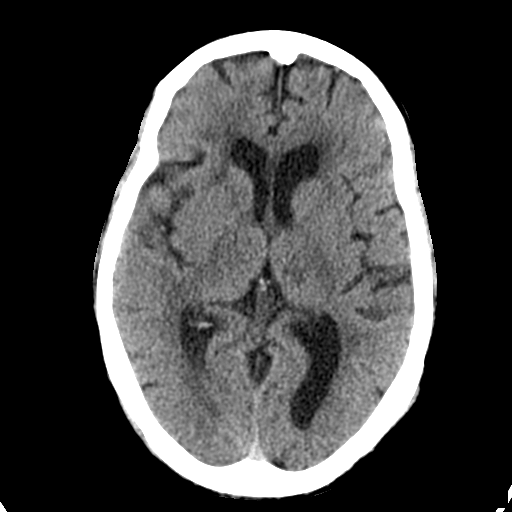
[im 14/29  bone]
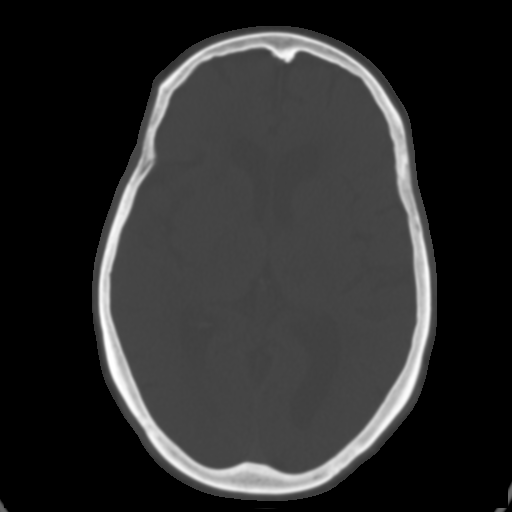
[im 16/29  brain]
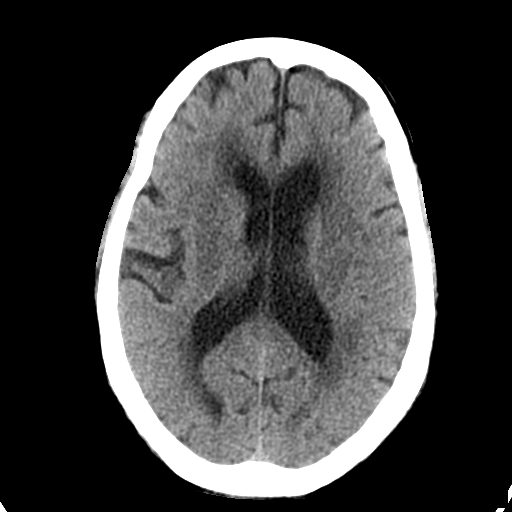
[im 19/29  brain]
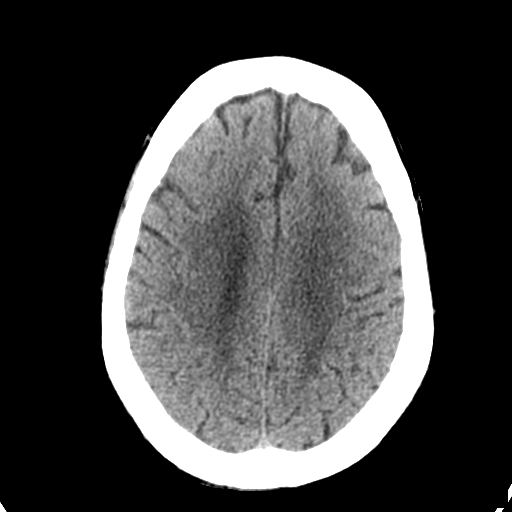
[im 22/29  brain]
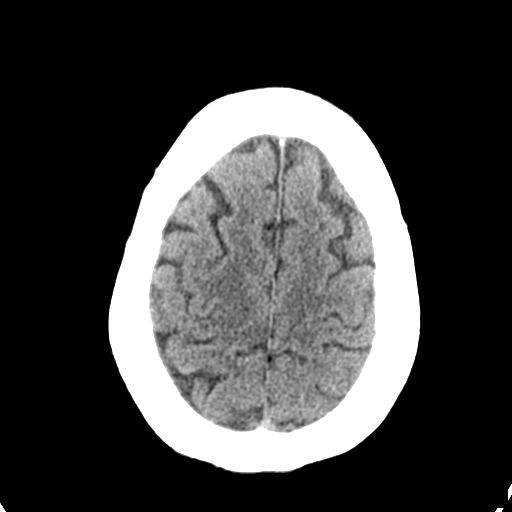
[im 24/29  brain]
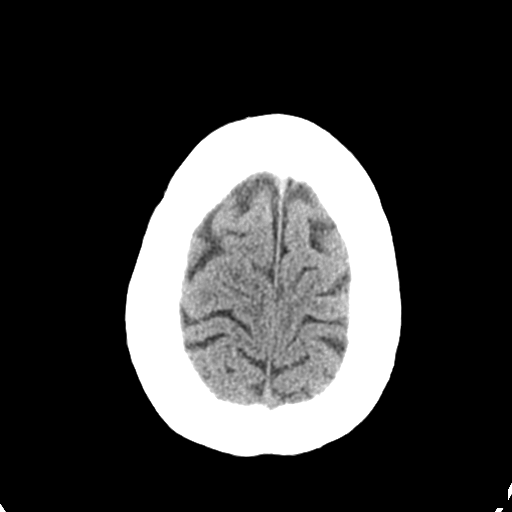
[im 24/29  bone]
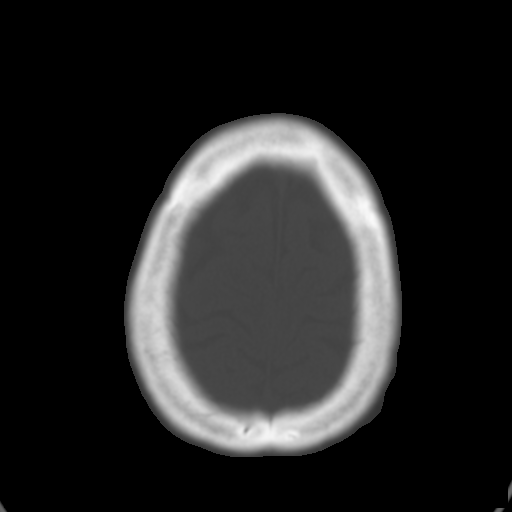
[im 27/29  brain]
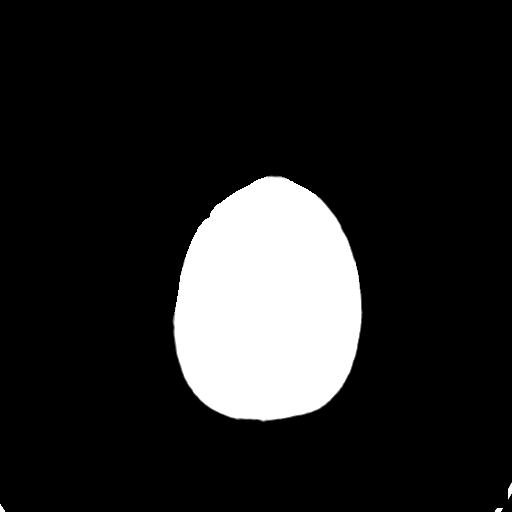

[Series 4: coronal soft tissue · coronal · 0.28mm/px · 3 of 63 slices shown]
[im 21/63  brain]
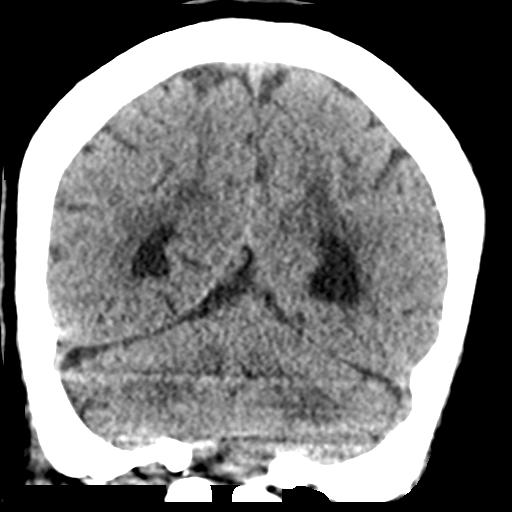
[im 28/63  brain]
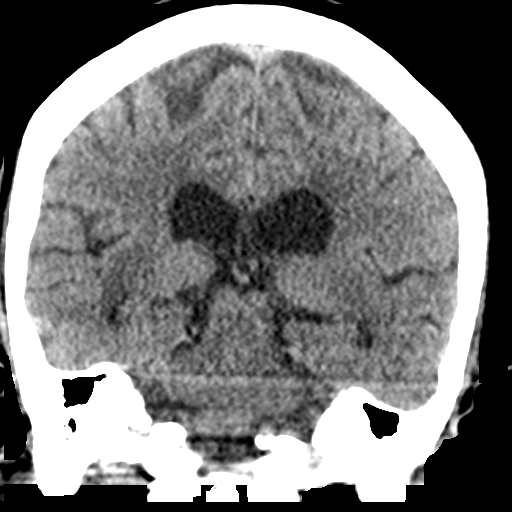
[im 35/63  brain]
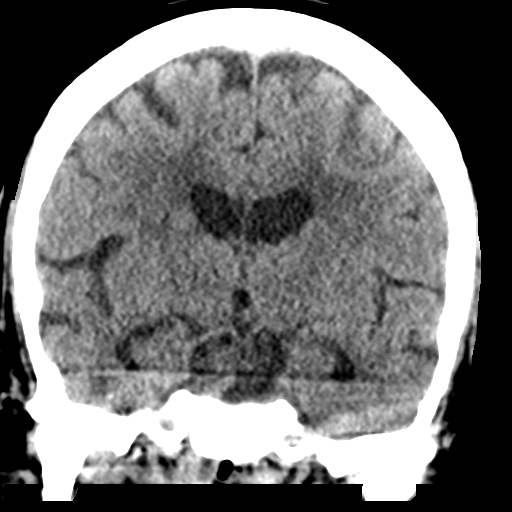

[Series 5: sagittal soft tissue · sagittal · 0.29mm/px · 3 of 48 slices shown]
[im 16/48  brain]
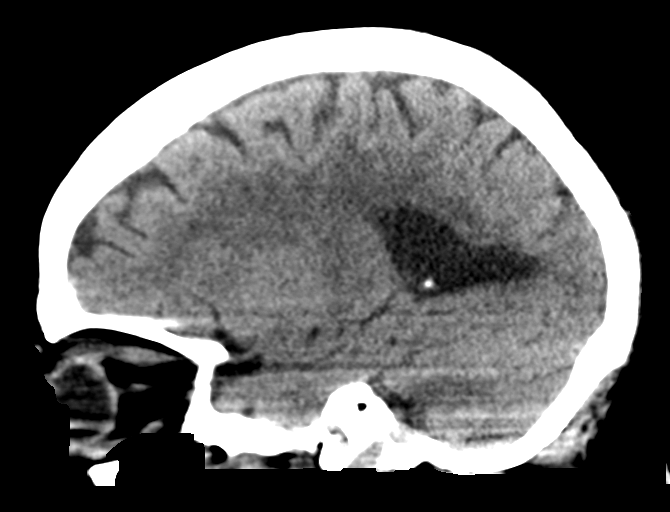
[im 24/48  brain]
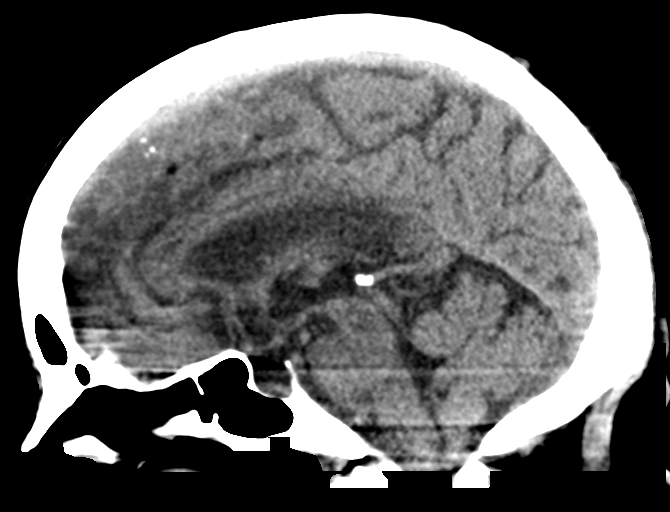
[im 32/48  brain]
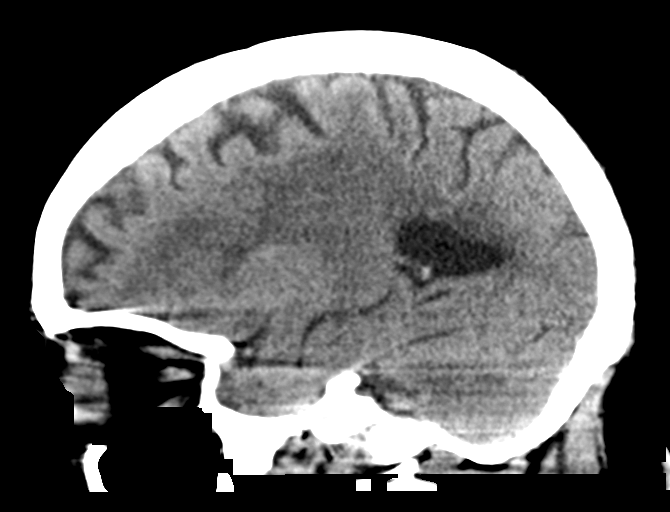

[16 of 46 positions shown; findings below may reference images not displayed]

FINDINGS: BRAIN: The ventricles and sulci are normal for age. No
intraparenchymal hemorrhage, mass effect nor midline shift. Some
progression of chronic small vessel ischemic disease since prior
exam involving the periventricular and subcortical white matter. No
acute large vascular territory infarcts. No abnormal extra-axial
fluid collections. Basal cisterns are patent.

VASCULAR: Moderate calcific atherosclerosis of the carotid siphons.

SKULL: No skull fracture. No significant scalp soft tissue swelling.

SINUSES/ORBITS: The mastoid air-cells and included paranasal sinuses
are well-aerated.The included ocular globes and orbital contents are
non-suspicious.

OTHER: None.
IMPRESSION: Chronic small vessel ischemic disease, progressed since 3106. No
acute intracranial abnormality identified.

## 2018-04-02 DIAGNOSIS — M7052 Other bursitis of knee, left knee: Secondary | ICD-10-CM | POA: Diagnosis not present

## 2018-04-02 DIAGNOSIS — I10 Essential (primary) hypertension: Secondary | ICD-10-CM | POA: Diagnosis not present

## 2018-04-02 DIAGNOSIS — E1140 Type 2 diabetes mellitus with diabetic neuropathy, unspecified: Secondary | ICD-10-CM | POA: Diagnosis not present

## 2018-04-02 DIAGNOSIS — R54 Age-related physical debility: Secondary | ICD-10-CM | POA: Diagnosis not present

## 2018-04-02 DIAGNOSIS — M25562 Pain in left knee: Secondary | ICD-10-CM | POA: Diagnosis not present

## 2018-04-02 DIAGNOSIS — I4891 Unspecified atrial fibrillation: Secondary | ICD-10-CM | POA: Diagnosis not present

## 2018-04-02 DIAGNOSIS — I5020 Unspecified systolic (congestive) heart failure: Secondary | ICD-10-CM | POA: Diagnosis not present

## 2018-04-02 DIAGNOSIS — M6281 Muscle weakness (generalized): Secondary | ICD-10-CM | POA: Diagnosis not present

## 2018-04-02 DIAGNOSIS — I25118 Atherosclerotic heart disease of native coronary artery with other forms of angina pectoris: Secondary | ICD-10-CM | POA: Diagnosis not present

## 2018-04-10 DIAGNOSIS — E039 Hypothyroidism, unspecified: Secondary | ICD-10-CM | POA: Diagnosis not present

## 2018-04-10 DIAGNOSIS — Z79899 Other long term (current) drug therapy: Secondary | ICD-10-CM | POA: Diagnosis not present

## 2018-04-10 DIAGNOSIS — E559 Vitamin D deficiency, unspecified: Secondary | ICD-10-CM | POA: Diagnosis not present

## 2018-04-18 DIAGNOSIS — M6281 Muscle weakness (generalized): Secondary | ICD-10-CM | POA: Diagnosis not present

## 2018-04-18 DIAGNOSIS — M159 Polyosteoarthritis, unspecified: Secondary | ICD-10-CM | POA: Diagnosis not present

## 2018-04-18 DIAGNOSIS — Z9181 History of falling: Secondary | ICD-10-CM | POA: Diagnosis not present

## 2018-04-18 DIAGNOSIS — N390 Urinary tract infection, site not specified: Secondary | ICD-10-CM | POA: Diagnosis not present

## 2018-04-18 DIAGNOSIS — F0391 Unspecified dementia with behavioral disturbance: Secondary | ICD-10-CM | POA: Diagnosis not present

## 2018-04-18 DIAGNOSIS — R2689 Other abnormalities of gait and mobility: Secondary | ICD-10-CM | POA: Diagnosis not present

## 2018-04-19 DIAGNOSIS — R2689 Other abnormalities of gait and mobility: Secondary | ICD-10-CM | POA: Diagnosis not present

## 2018-04-19 DIAGNOSIS — M6281 Muscle weakness (generalized): Secondary | ICD-10-CM | POA: Diagnosis not present

## 2018-04-19 DIAGNOSIS — F0391 Unspecified dementia with behavioral disturbance: Secondary | ICD-10-CM | POA: Diagnosis not present

## 2018-04-19 DIAGNOSIS — F0151 Vascular dementia with behavioral disturbance: Secondary | ICD-10-CM | POA: Diagnosis not present

## 2018-04-19 DIAGNOSIS — N390 Urinary tract infection, site not specified: Secondary | ICD-10-CM | POA: Diagnosis not present

## 2018-04-19 DIAGNOSIS — F330 Major depressive disorder, recurrent, mild: Secondary | ICD-10-CM | POA: Diagnosis not present

## 2018-04-19 DIAGNOSIS — Z9181 History of falling: Secondary | ICD-10-CM | POA: Diagnosis not present

## 2018-04-19 DIAGNOSIS — M159 Polyosteoarthritis, unspecified: Secondary | ICD-10-CM | POA: Diagnosis not present

## 2018-04-20 DIAGNOSIS — Z9181 History of falling: Secondary | ICD-10-CM | POA: Diagnosis not present

## 2018-04-20 DIAGNOSIS — E119 Type 2 diabetes mellitus without complications: Secondary | ICD-10-CM | POA: Diagnosis not present

## 2018-04-20 DIAGNOSIS — I10 Essential (primary) hypertension: Secondary | ICD-10-CM | POA: Diagnosis not present

## 2018-04-20 DIAGNOSIS — R600 Localized edema: Secondary | ICD-10-CM | POA: Diagnosis not present

## 2018-04-20 DIAGNOSIS — F0391 Unspecified dementia with behavioral disturbance: Secondary | ICD-10-CM | POA: Diagnosis not present

## 2018-04-20 DIAGNOSIS — N390 Urinary tract infection, site not specified: Secondary | ICD-10-CM | POA: Diagnosis not present

## 2018-04-20 DIAGNOSIS — L853 Xerosis cutis: Secondary | ICD-10-CM | POA: Diagnosis not present

## 2018-04-20 DIAGNOSIS — M6281 Muscle weakness (generalized): Secondary | ICD-10-CM | POA: Diagnosis not present

## 2018-04-20 DIAGNOSIS — L603 Nail dystrophy: Secondary | ICD-10-CM | POA: Diagnosis not present

## 2018-04-20 DIAGNOSIS — M159 Polyosteoarthritis, unspecified: Secondary | ICD-10-CM | POA: Diagnosis not present

## 2018-04-20 DIAGNOSIS — B351 Tinea unguium: Secondary | ICD-10-CM | POA: Diagnosis not present

## 2018-04-20 DIAGNOSIS — M2010 Hallux valgus (acquired), unspecified foot: Secondary | ICD-10-CM | POA: Diagnosis not present

## 2018-04-20 DIAGNOSIS — R319 Hematuria, unspecified: Secondary | ICD-10-CM | POA: Diagnosis not present

## 2018-04-20 DIAGNOSIS — R2689 Other abnormalities of gait and mobility: Secondary | ICD-10-CM | POA: Diagnosis not present

## 2018-04-20 DIAGNOSIS — D649 Anemia, unspecified: Secondary | ICD-10-CM | POA: Diagnosis not present

## 2018-04-20 DIAGNOSIS — Q845 Enlarged and hypertrophic nails: Secondary | ICD-10-CM | POA: Diagnosis not present

## 2018-04-20 DIAGNOSIS — E039 Hypothyroidism, unspecified: Secondary | ICD-10-CM | POA: Diagnosis not present

## 2018-04-20 DIAGNOSIS — Z79899 Other long term (current) drug therapy: Secondary | ICD-10-CM | POA: Diagnosis not present

## 2018-04-20 DIAGNOSIS — E1140 Type 2 diabetes mellitus with diabetic neuropathy, unspecified: Secondary | ICD-10-CM | POA: Diagnosis not present

## 2018-04-20 DIAGNOSIS — E559 Vitamin D deficiency, unspecified: Secondary | ICD-10-CM | POA: Diagnosis not present

## 2018-04-20 DIAGNOSIS — I739 Peripheral vascular disease, unspecified: Secondary | ICD-10-CM | POA: Diagnosis not present

## 2018-04-20 DIAGNOSIS — E785 Hyperlipidemia, unspecified: Secondary | ICD-10-CM | POA: Diagnosis not present

## 2018-04-23 DIAGNOSIS — Z9181 History of falling: Secondary | ICD-10-CM | POA: Diagnosis not present

## 2018-04-23 DIAGNOSIS — M159 Polyosteoarthritis, unspecified: Secondary | ICD-10-CM | POA: Diagnosis not present

## 2018-04-23 DIAGNOSIS — N390 Urinary tract infection, site not specified: Secondary | ICD-10-CM | POA: Diagnosis not present

## 2018-04-23 DIAGNOSIS — F0391 Unspecified dementia with behavioral disturbance: Secondary | ICD-10-CM | POA: Diagnosis not present

## 2018-04-23 DIAGNOSIS — M6281 Muscle weakness (generalized): Secondary | ICD-10-CM | POA: Diagnosis not present

## 2018-04-23 DIAGNOSIS — R2689 Other abnormalities of gait and mobility: Secondary | ICD-10-CM | POA: Diagnosis not present

## 2018-04-24 DIAGNOSIS — N390 Urinary tract infection, site not specified: Secondary | ICD-10-CM | POA: Diagnosis not present

## 2018-04-24 DIAGNOSIS — M159 Polyosteoarthritis, unspecified: Secondary | ICD-10-CM | POA: Diagnosis not present

## 2018-04-24 DIAGNOSIS — R2689 Other abnormalities of gait and mobility: Secondary | ICD-10-CM | POA: Diagnosis not present

## 2018-04-24 DIAGNOSIS — M6281 Muscle weakness (generalized): Secondary | ICD-10-CM | POA: Diagnosis not present

## 2018-04-24 DIAGNOSIS — Z9181 History of falling: Secondary | ICD-10-CM | POA: Diagnosis not present

## 2018-04-24 DIAGNOSIS — F0391 Unspecified dementia with behavioral disturbance: Secondary | ICD-10-CM | POA: Diagnosis not present

## 2018-04-25 DIAGNOSIS — M6281 Muscle weakness (generalized): Secondary | ICD-10-CM | POA: Diagnosis not present

## 2018-04-25 DIAGNOSIS — Z9181 History of falling: Secondary | ICD-10-CM | POA: Diagnosis not present

## 2018-04-25 DIAGNOSIS — F0391 Unspecified dementia with behavioral disturbance: Secondary | ICD-10-CM | POA: Diagnosis not present

## 2018-04-25 DIAGNOSIS — N390 Urinary tract infection, site not specified: Secondary | ICD-10-CM | POA: Diagnosis not present

## 2018-04-25 DIAGNOSIS — M159 Polyosteoarthritis, unspecified: Secondary | ICD-10-CM | POA: Diagnosis not present

## 2018-04-25 DIAGNOSIS — R2689 Other abnormalities of gait and mobility: Secondary | ICD-10-CM | POA: Diagnosis not present

## 2018-04-26 DIAGNOSIS — Z9181 History of falling: Secondary | ICD-10-CM | POA: Diagnosis not present

## 2018-04-26 DIAGNOSIS — M6281 Muscle weakness (generalized): Secondary | ICD-10-CM | POA: Diagnosis not present

## 2018-04-26 DIAGNOSIS — N390 Urinary tract infection, site not specified: Secondary | ICD-10-CM | POA: Diagnosis not present

## 2018-04-26 DIAGNOSIS — R2689 Other abnormalities of gait and mobility: Secondary | ICD-10-CM | POA: Diagnosis not present

## 2018-04-26 DIAGNOSIS — M159 Polyosteoarthritis, unspecified: Secondary | ICD-10-CM | POA: Diagnosis not present

## 2018-04-26 DIAGNOSIS — F0391 Unspecified dementia with behavioral disturbance: Secondary | ICD-10-CM | POA: Diagnosis not present

## 2018-04-27 DIAGNOSIS — Z9181 History of falling: Secondary | ICD-10-CM | POA: Diagnosis not present

## 2018-04-27 DIAGNOSIS — N390 Urinary tract infection, site not specified: Secondary | ICD-10-CM | POA: Diagnosis not present

## 2018-04-27 DIAGNOSIS — R2689 Other abnormalities of gait and mobility: Secondary | ICD-10-CM | POA: Diagnosis not present

## 2018-04-27 DIAGNOSIS — M159 Polyosteoarthritis, unspecified: Secondary | ICD-10-CM | POA: Diagnosis not present

## 2018-04-27 DIAGNOSIS — F0391 Unspecified dementia with behavioral disturbance: Secondary | ICD-10-CM | POA: Diagnosis not present

## 2018-04-27 DIAGNOSIS — M6281 Muscle weakness (generalized): Secondary | ICD-10-CM | POA: Diagnosis not present

## 2018-04-30 DIAGNOSIS — N390 Urinary tract infection, site not specified: Secondary | ICD-10-CM | POA: Diagnosis not present

## 2018-04-30 DIAGNOSIS — F0391 Unspecified dementia with behavioral disturbance: Secondary | ICD-10-CM | POA: Diagnosis not present

## 2018-04-30 DIAGNOSIS — R2689 Other abnormalities of gait and mobility: Secondary | ICD-10-CM | POA: Diagnosis not present

## 2018-04-30 DIAGNOSIS — M6281 Muscle weakness (generalized): Secondary | ICD-10-CM | POA: Diagnosis not present

## 2018-04-30 DIAGNOSIS — M159 Polyosteoarthritis, unspecified: Secondary | ICD-10-CM | POA: Diagnosis not present

## 2018-04-30 DIAGNOSIS — Z9181 History of falling: Secondary | ICD-10-CM | POA: Diagnosis not present

## 2018-05-01 DIAGNOSIS — R2689 Other abnormalities of gait and mobility: Secondary | ICD-10-CM | POA: Diagnosis not present

## 2018-05-01 DIAGNOSIS — M6281 Muscle weakness (generalized): Secondary | ICD-10-CM | POA: Diagnosis not present

## 2018-05-01 DIAGNOSIS — F0391 Unspecified dementia with behavioral disturbance: Secondary | ICD-10-CM | POA: Diagnosis not present

## 2018-05-01 DIAGNOSIS — N390 Urinary tract infection, site not specified: Secondary | ICD-10-CM | POA: Diagnosis not present

## 2018-05-01 DIAGNOSIS — Z9181 History of falling: Secondary | ICD-10-CM | POA: Diagnosis not present

## 2018-05-01 DIAGNOSIS — M159 Polyosteoarthritis, unspecified: Secondary | ICD-10-CM | POA: Diagnosis not present

## 2018-05-02 DIAGNOSIS — F0391 Unspecified dementia with behavioral disturbance: Secondary | ICD-10-CM | POA: Diagnosis not present

## 2018-05-02 DIAGNOSIS — R2689 Other abnormalities of gait and mobility: Secondary | ICD-10-CM | POA: Diagnosis not present

## 2018-05-02 DIAGNOSIS — Z9181 History of falling: Secondary | ICD-10-CM | POA: Diagnosis not present

## 2018-05-02 DIAGNOSIS — M6281 Muscle weakness (generalized): Secondary | ICD-10-CM | POA: Diagnosis not present

## 2018-05-02 DIAGNOSIS — M159 Polyosteoarthritis, unspecified: Secondary | ICD-10-CM | POA: Diagnosis not present

## 2018-05-02 DIAGNOSIS — N390 Urinary tract infection, site not specified: Secondary | ICD-10-CM | POA: Diagnosis not present

## 2018-05-03 DIAGNOSIS — M159 Polyosteoarthritis, unspecified: Secondary | ICD-10-CM | POA: Diagnosis not present

## 2018-05-03 DIAGNOSIS — Z9181 History of falling: Secondary | ICD-10-CM | POA: Diagnosis not present

## 2018-05-03 DIAGNOSIS — F0151 Vascular dementia with behavioral disturbance: Secondary | ICD-10-CM | POA: Diagnosis not present

## 2018-05-03 DIAGNOSIS — F0391 Unspecified dementia with behavioral disturbance: Secondary | ICD-10-CM | POA: Diagnosis not present

## 2018-05-03 DIAGNOSIS — F330 Major depressive disorder, recurrent, mild: Secondary | ICD-10-CM | POA: Diagnosis not present

## 2018-05-03 DIAGNOSIS — M6281 Muscle weakness (generalized): Secondary | ICD-10-CM | POA: Diagnosis not present

## 2018-05-03 DIAGNOSIS — R2689 Other abnormalities of gait and mobility: Secondary | ICD-10-CM | POA: Diagnosis not present

## 2018-05-03 DIAGNOSIS — F5105 Insomnia due to other mental disorder: Secondary | ICD-10-CM | POA: Diagnosis not present

## 2018-05-03 DIAGNOSIS — N390 Urinary tract infection, site not specified: Secondary | ICD-10-CM | POA: Diagnosis not present

## 2018-05-04 DIAGNOSIS — N390 Urinary tract infection, site not specified: Secondary | ICD-10-CM | POA: Diagnosis not present

## 2018-05-04 DIAGNOSIS — R2689 Other abnormalities of gait and mobility: Secondary | ICD-10-CM | POA: Diagnosis not present

## 2018-05-04 DIAGNOSIS — M6281 Muscle weakness (generalized): Secondary | ICD-10-CM | POA: Diagnosis not present

## 2018-05-04 DIAGNOSIS — M159 Polyosteoarthritis, unspecified: Secondary | ICD-10-CM | POA: Diagnosis not present

## 2018-05-04 DIAGNOSIS — F0391 Unspecified dementia with behavioral disturbance: Secondary | ICD-10-CM | POA: Diagnosis not present

## 2018-05-04 DIAGNOSIS — Z9181 History of falling: Secondary | ICD-10-CM | POA: Diagnosis not present

## 2018-05-05 DIAGNOSIS — R072 Precordial pain: Secondary | ICD-10-CM | POA: Diagnosis not present

## 2018-05-05 DIAGNOSIS — R079 Chest pain, unspecified: Secondary | ICD-10-CM | POA: Diagnosis not present

## 2018-05-05 DIAGNOSIS — R110 Nausea: Secondary | ICD-10-CM | POA: Diagnosis not present

## 2018-05-05 DIAGNOSIS — I509 Heart failure, unspecified: Secondary | ICD-10-CM | POA: Diagnosis not present

## 2018-05-05 DIAGNOSIS — E119 Type 2 diabetes mellitus without complications: Secondary | ICD-10-CM | POA: Diagnosis not present

## 2018-05-05 DIAGNOSIS — Z95810 Presence of automatic (implantable) cardiac defibrillator: Secondary | ICD-10-CM | POA: Diagnosis not present

## 2018-05-05 DIAGNOSIS — I161 Hypertensive emergency: Secondary | ICD-10-CM | POA: Diagnosis not present

## 2018-05-05 DIAGNOSIS — R7989 Other specified abnormal findings of blood chemistry: Secondary | ICD-10-CM | POA: Diagnosis not present

## 2018-05-05 DIAGNOSIS — I10 Essential (primary) hypertension: Secondary | ICD-10-CM | POA: Diagnosis not present

## 2018-05-05 DIAGNOSIS — I119 Hypertensive heart disease without heart failure: Secondary | ICD-10-CM | POA: Diagnosis not present

## 2018-05-05 DIAGNOSIS — R0789 Other chest pain: Secondary | ICD-10-CM | POA: Diagnosis not present

## 2018-05-05 DIAGNOSIS — R944 Abnormal results of kidney function studies: Secondary | ICD-10-CM | POA: Diagnosis not present

## 2018-05-05 DIAGNOSIS — K219 Gastro-esophageal reflux disease without esophagitis: Secondary | ICD-10-CM | POA: Diagnosis not present

## 2018-05-06 DIAGNOSIS — R079 Chest pain, unspecified: Secondary | ICD-10-CM | POA: Diagnosis not present

## 2018-05-06 DIAGNOSIS — R402441 Other coma, without documented Glasgow coma scale score, or with partial score reported, in the field [EMT or ambulance]: Secondary | ICD-10-CM | POA: Diagnosis not present

## 2018-05-06 DIAGNOSIS — Z7401 Bed confinement status: Secondary | ICD-10-CM | POA: Diagnosis not present

## 2018-05-06 DIAGNOSIS — M2550 Pain in unspecified joint: Secondary | ICD-10-CM | POA: Diagnosis not present

## 2018-05-06 DIAGNOSIS — R0789 Other chest pain: Secondary | ICD-10-CM | POA: Diagnosis not present

## 2018-05-07 DIAGNOSIS — Z9181 History of falling: Secondary | ICD-10-CM | POA: Diagnosis not present

## 2018-05-07 DIAGNOSIS — R2689 Other abnormalities of gait and mobility: Secondary | ICD-10-CM | POA: Diagnosis not present

## 2018-05-07 DIAGNOSIS — N390 Urinary tract infection, site not specified: Secondary | ICD-10-CM | POA: Diagnosis not present

## 2018-05-07 DIAGNOSIS — R079 Chest pain, unspecified: Secondary | ICD-10-CM | POA: Diagnosis not present

## 2018-05-07 DIAGNOSIS — M159 Polyosteoarthritis, unspecified: Secondary | ICD-10-CM | POA: Diagnosis not present

## 2018-05-07 DIAGNOSIS — M6281 Muscle weakness (generalized): Secondary | ICD-10-CM | POA: Diagnosis not present

## 2018-05-07 DIAGNOSIS — F0391 Unspecified dementia with behavioral disturbance: Secondary | ICD-10-CM | POA: Diagnosis not present

## 2018-05-08 DIAGNOSIS — F0391 Unspecified dementia with behavioral disturbance: Secondary | ICD-10-CM | POA: Diagnosis not present

## 2018-05-08 DIAGNOSIS — N390 Urinary tract infection, site not specified: Secondary | ICD-10-CM | POA: Diagnosis not present

## 2018-05-08 DIAGNOSIS — I4891 Unspecified atrial fibrillation: Secondary | ICD-10-CM | POA: Diagnosis not present

## 2018-05-08 DIAGNOSIS — K219 Gastro-esophageal reflux disease without esophagitis: Secondary | ICD-10-CM | POA: Diagnosis not present

## 2018-05-08 DIAGNOSIS — M6281 Muscle weakness (generalized): Secondary | ICD-10-CM | POA: Diagnosis not present

## 2018-05-08 DIAGNOSIS — R2689 Other abnormalities of gait and mobility: Secondary | ICD-10-CM | POA: Diagnosis not present

## 2018-05-08 DIAGNOSIS — Z9181 History of falling: Secondary | ICD-10-CM | POA: Diagnosis not present

## 2018-05-08 DIAGNOSIS — M159 Polyosteoarthritis, unspecified: Secondary | ICD-10-CM | POA: Diagnosis not present

## 2018-05-08 DIAGNOSIS — W19XXXD Unspecified fall, subsequent encounter: Secondary | ICD-10-CM | POA: Diagnosis not present

## 2018-05-08 DIAGNOSIS — R569 Unspecified convulsions: Secondary | ICD-10-CM | POA: Diagnosis not present

## 2018-05-09 DIAGNOSIS — M6281 Muscle weakness (generalized): Secondary | ICD-10-CM | POA: Diagnosis not present

## 2018-05-09 DIAGNOSIS — F0391 Unspecified dementia with behavioral disturbance: Secondary | ICD-10-CM | POA: Diagnosis not present

## 2018-05-09 DIAGNOSIS — R2689 Other abnormalities of gait and mobility: Secondary | ICD-10-CM | POA: Diagnosis not present

## 2018-05-09 DIAGNOSIS — N390 Urinary tract infection, site not specified: Secondary | ICD-10-CM | POA: Diagnosis not present

## 2018-05-09 DIAGNOSIS — M159 Polyosteoarthritis, unspecified: Secondary | ICD-10-CM | POA: Diagnosis not present

## 2018-05-09 DIAGNOSIS — Z9181 History of falling: Secondary | ICD-10-CM | POA: Diagnosis not present

## 2018-05-10 DIAGNOSIS — Z9181 History of falling: Secondary | ICD-10-CM | POA: Diagnosis not present

## 2018-05-10 DIAGNOSIS — F0391 Unspecified dementia with behavioral disturbance: Secondary | ICD-10-CM | POA: Diagnosis not present

## 2018-05-10 DIAGNOSIS — N390 Urinary tract infection, site not specified: Secondary | ICD-10-CM | POA: Diagnosis not present

## 2018-05-10 DIAGNOSIS — M6281 Muscle weakness (generalized): Secondary | ICD-10-CM | POA: Diagnosis not present

## 2018-05-10 DIAGNOSIS — M159 Polyosteoarthritis, unspecified: Secondary | ICD-10-CM | POA: Diagnosis not present

## 2018-05-10 DIAGNOSIS — R2689 Other abnormalities of gait and mobility: Secondary | ICD-10-CM | POA: Diagnosis not present

## 2018-05-11 DIAGNOSIS — N390 Urinary tract infection, site not specified: Secondary | ICD-10-CM | POA: Diagnosis not present

## 2018-05-11 DIAGNOSIS — M6281 Muscle weakness (generalized): Secondary | ICD-10-CM | POA: Diagnosis not present

## 2018-05-11 DIAGNOSIS — Z9181 History of falling: Secondary | ICD-10-CM | POA: Diagnosis not present

## 2018-05-11 DIAGNOSIS — R2689 Other abnormalities of gait and mobility: Secondary | ICD-10-CM | POA: Diagnosis not present

## 2018-05-11 DIAGNOSIS — F0391 Unspecified dementia with behavioral disturbance: Secondary | ICD-10-CM | POA: Diagnosis not present

## 2018-05-11 DIAGNOSIS — M159 Polyosteoarthritis, unspecified: Secondary | ICD-10-CM | POA: Diagnosis not present

## 2018-05-14 DIAGNOSIS — R2689 Other abnormalities of gait and mobility: Secondary | ICD-10-CM | POA: Diagnosis not present

## 2018-05-14 DIAGNOSIS — M6281 Muscle weakness (generalized): Secondary | ICD-10-CM | POA: Diagnosis not present

## 2018-05-14 DIAGNOSIS — F0391 Unspecified dementia with behavioral disturbance: Secondary | ICD-10-CM | POA: Diagnosis not present

## 2018-05-14 DIAGNOSIS — N390 Urinary tract infection, site not specified: Secondary | ICD-10-CM | POA: Diagnosis not present

## 2018-05-14 DIAGNOSIS — M159 Polyosteoarthritis, unspecified: Secondary | ICD-10-CM | POA: Diagnosis not present

## 2018-05-14 DIAGNOSIS — Z9181 History of falling: Secondary | ICD-10-CM | POA: Diagnosis not present

## 2018-05-14 IMAGING — DX DG KNEE COMPLETE 4+V*L*
4 series · 4 of 4 positions shown · non-contrast
Comparison: None.

CLINICAL DATA: Fell yesterday and landed on left knee. History of
prior surgery.

EXAM:
LEFT KNEE - COMPLETE 4+ VIEW

[knee ap (1 of 3)]
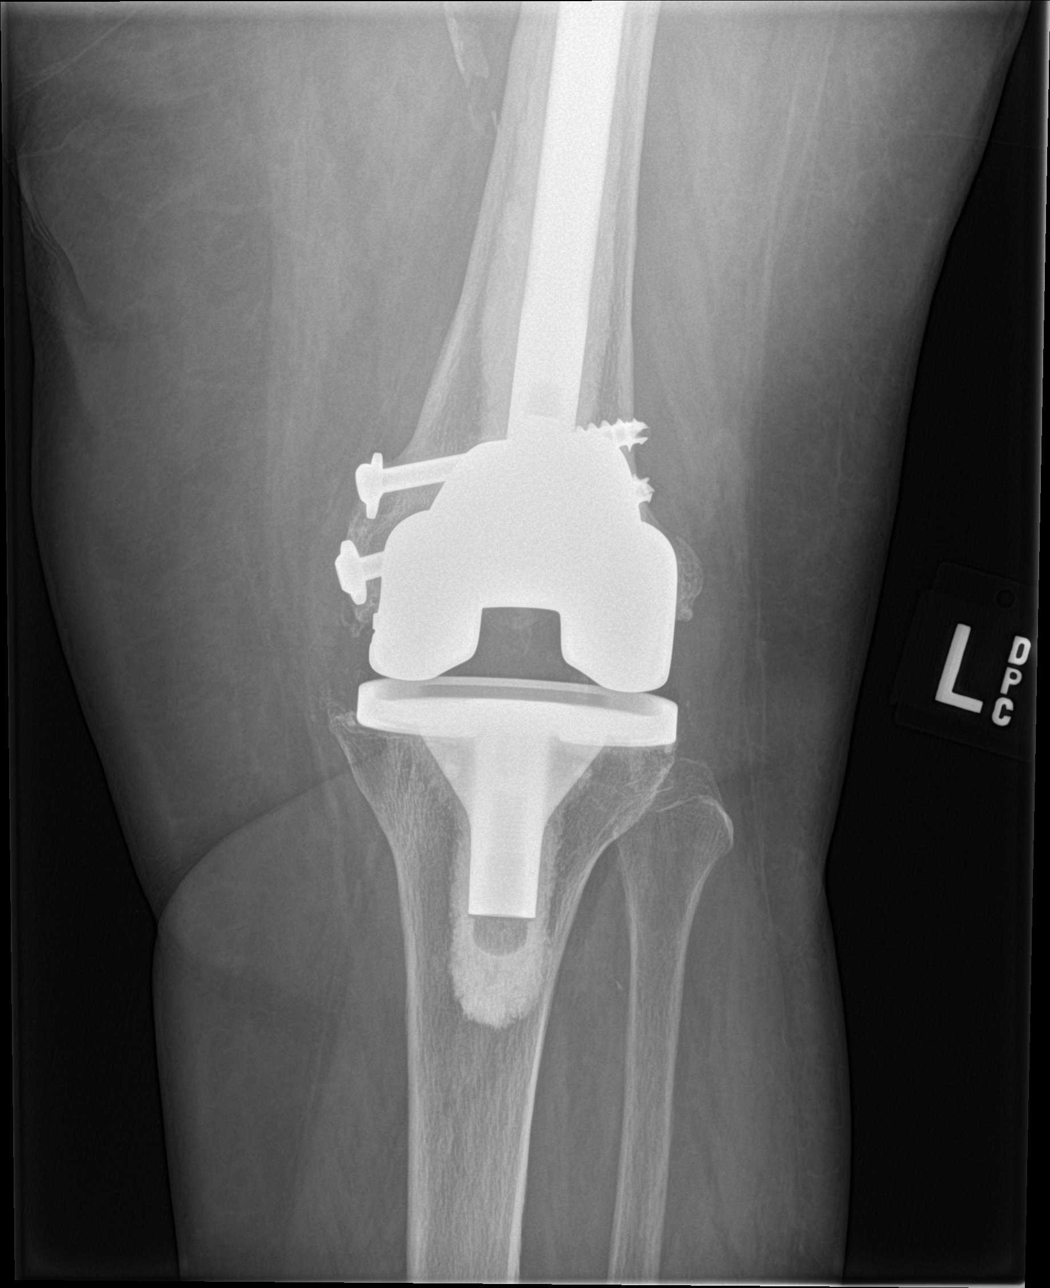

[knee lat]
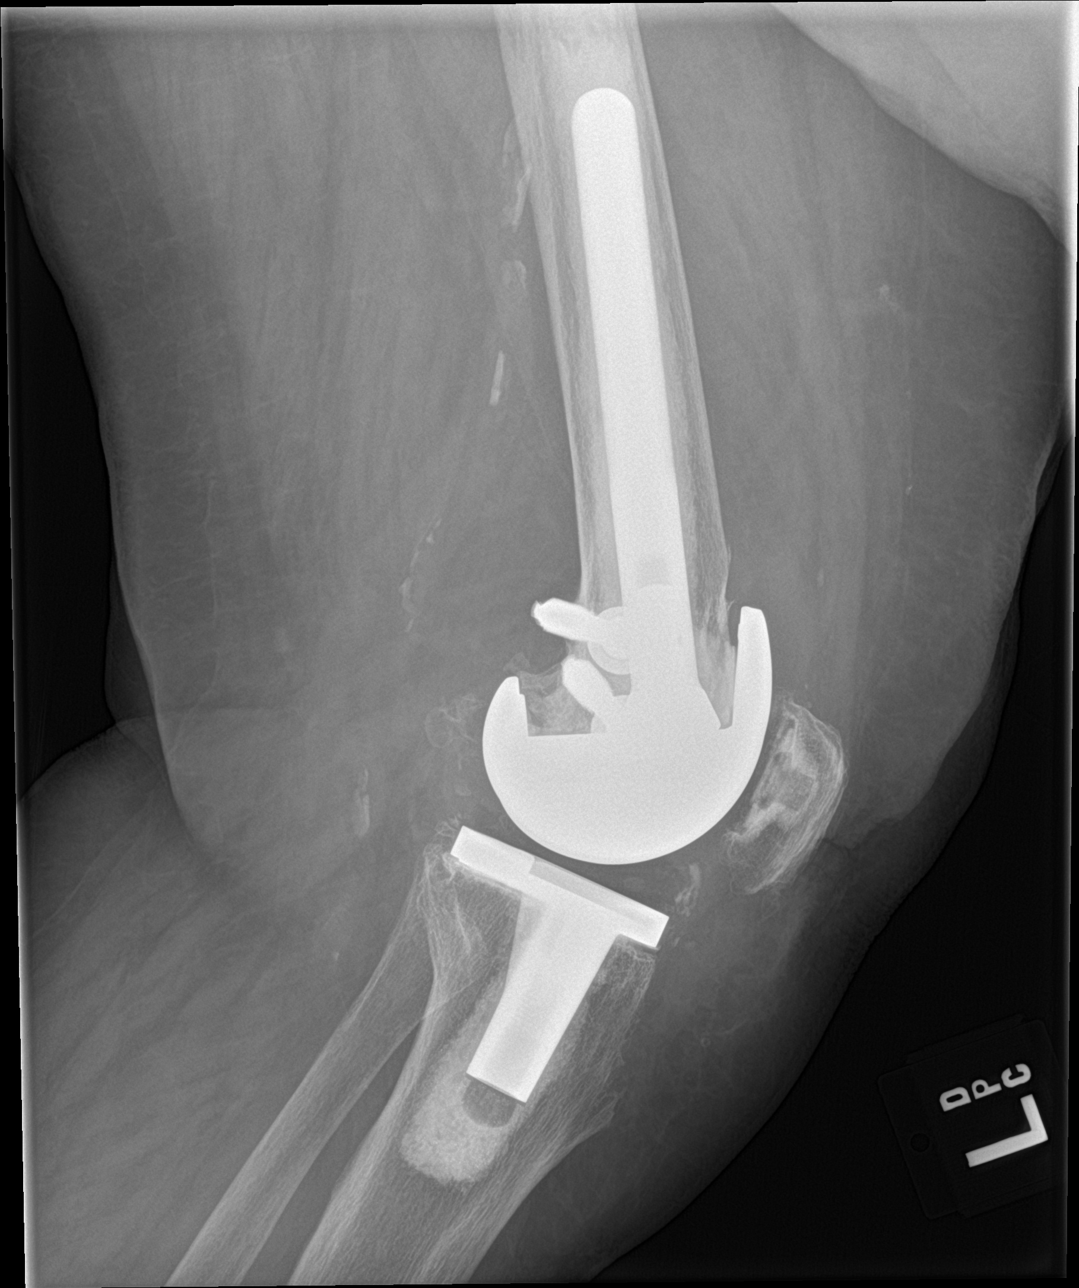

[knee ap (2 of 3)]
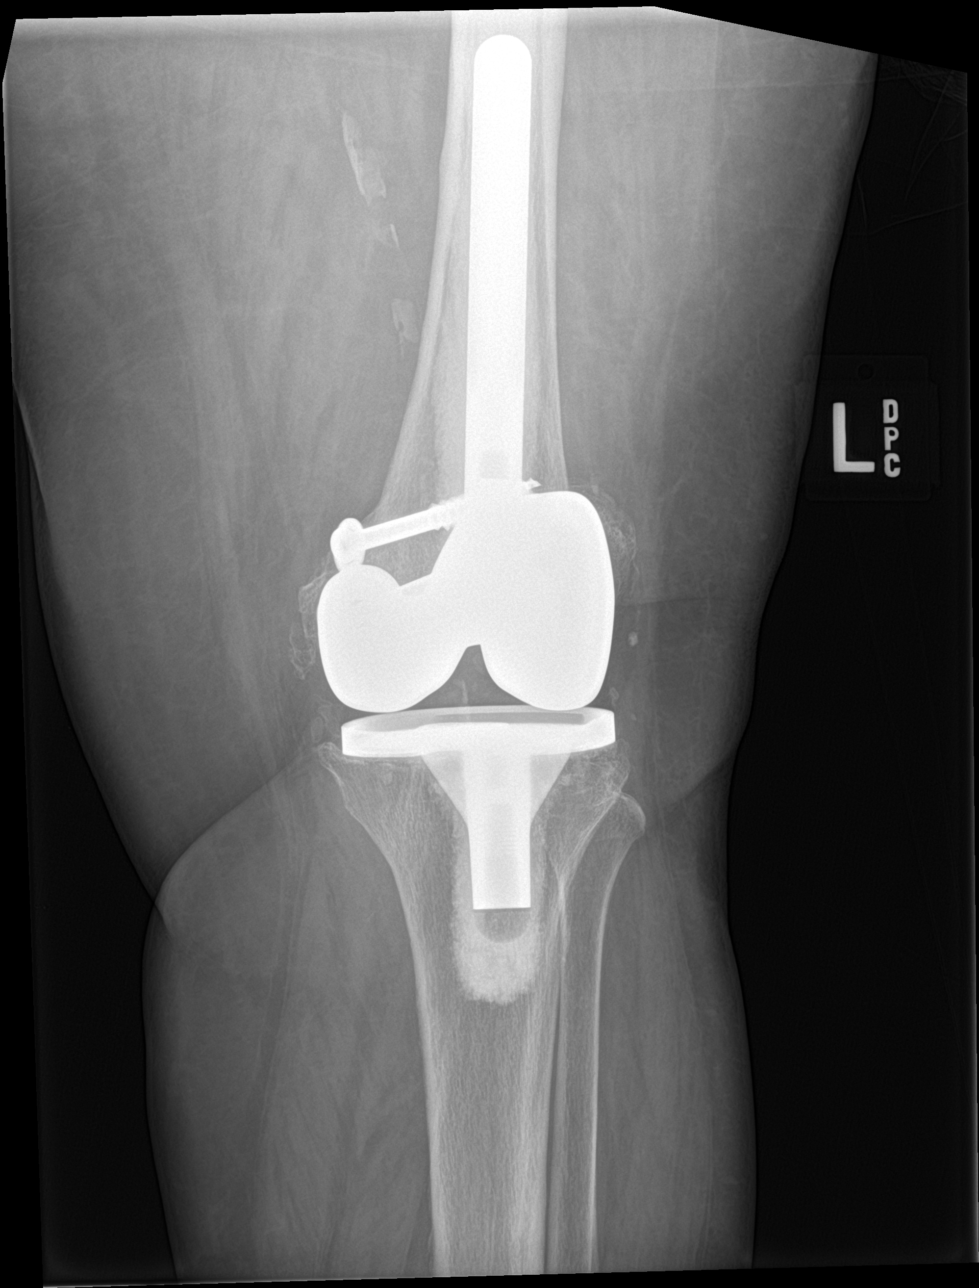

[knee ap (3 of 3)]
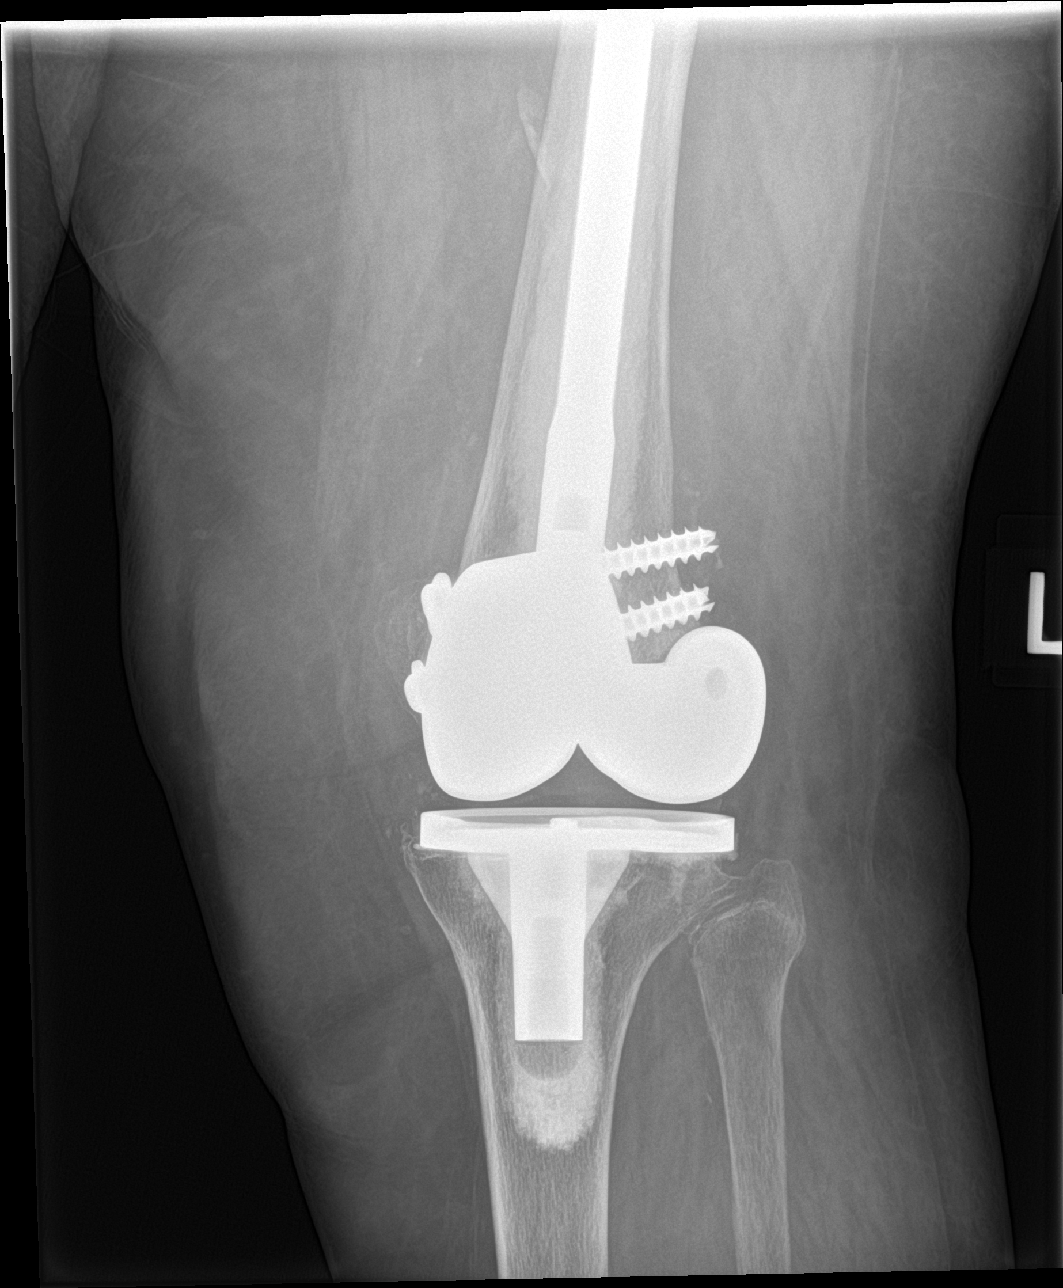

[4 of 4 positions shown; findings below may reference images not displayed]

FINDINGS: Long stem femoral prosthesis in place along with 2 large fixating
screws and bone cement. No complicating features or acute fractures
identified. The tibial component is well seated without complicating
features. No periprosthetic fracture. A small joint effusion is
noted. Vascular calcifications are noted.
IMPRESSION: Surgical changes with total knee arthroplasty components but no
complicating features or periprosthetic fracture.

Small joint effusion.

## 2018-05-14 IMAGING — DX DG HIP (WITH OR WITHOUT PELVIS) 2-3V*L*
3 series · 3 of 3 positions shown · non-contrast
Comparison: Radiographs December 24, 2014.

CLINICAL DATA: Left hip pain after fall yesterday.

EXAM:
DG HIP (WITH OR WITHOUT PELVIS) 2-3V LEFT

[pelvis ap]
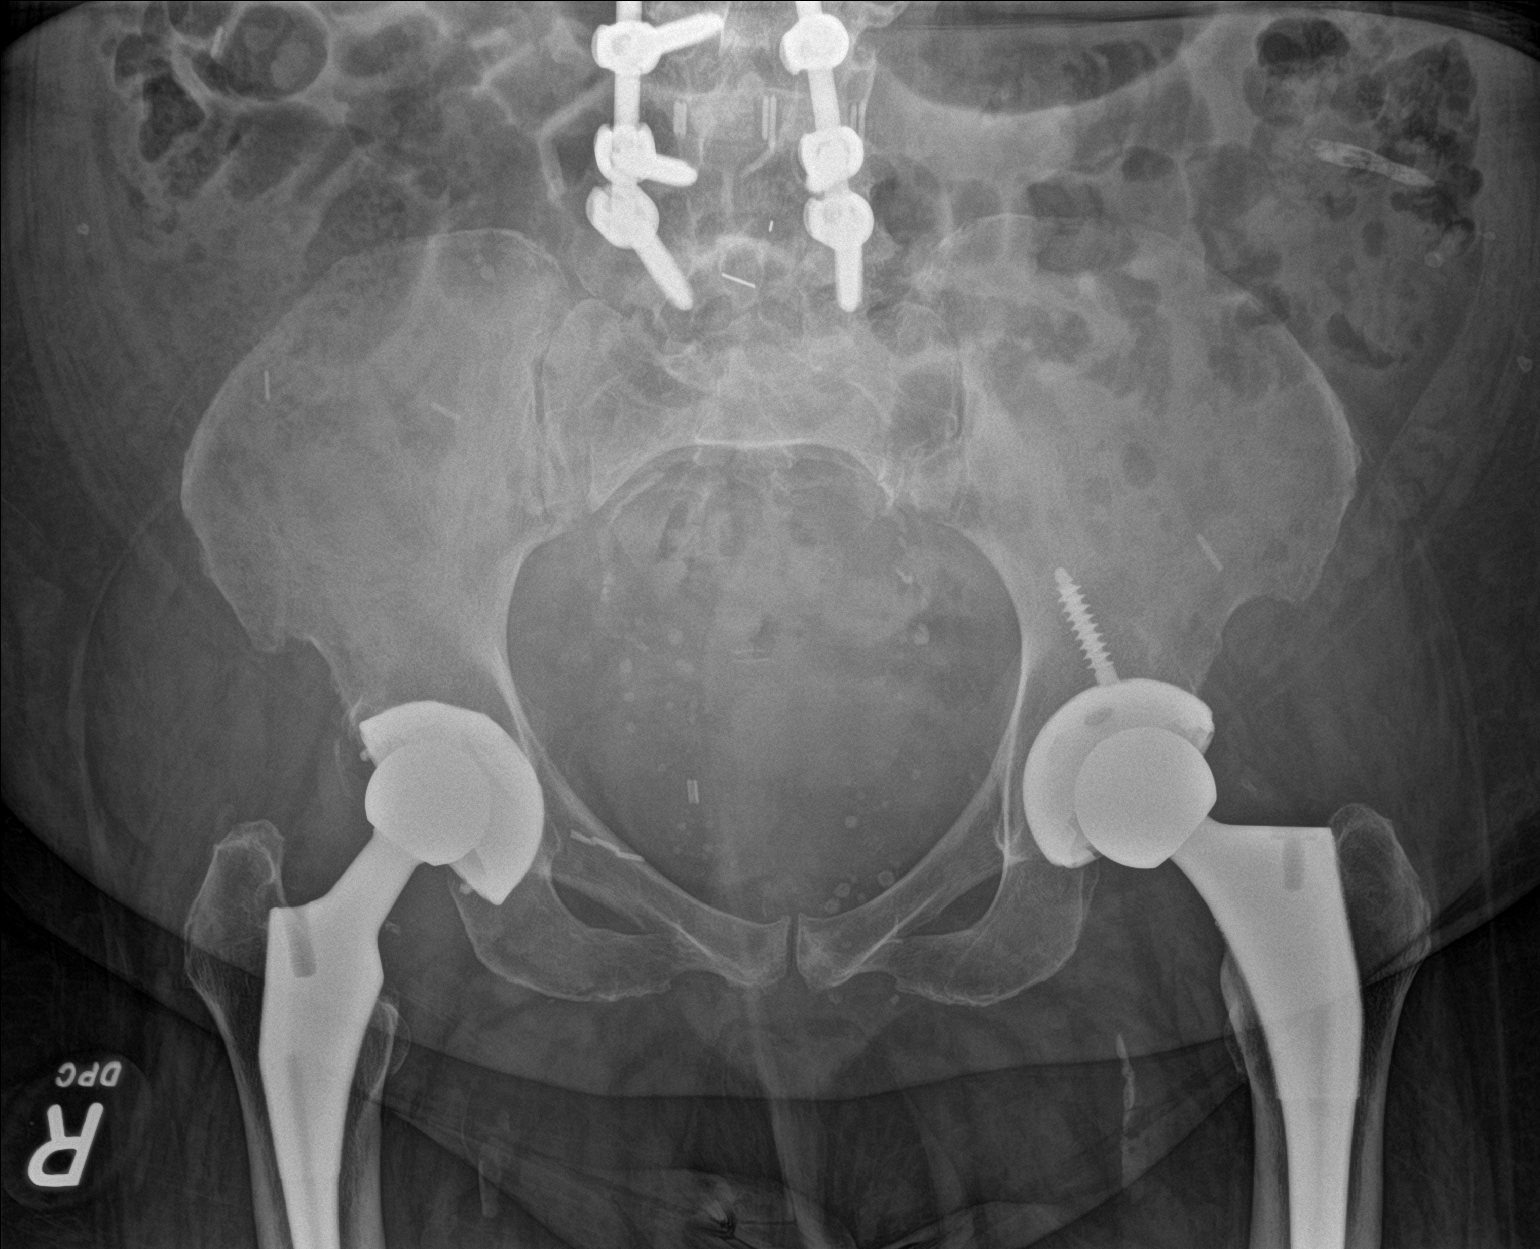

[hip ap]
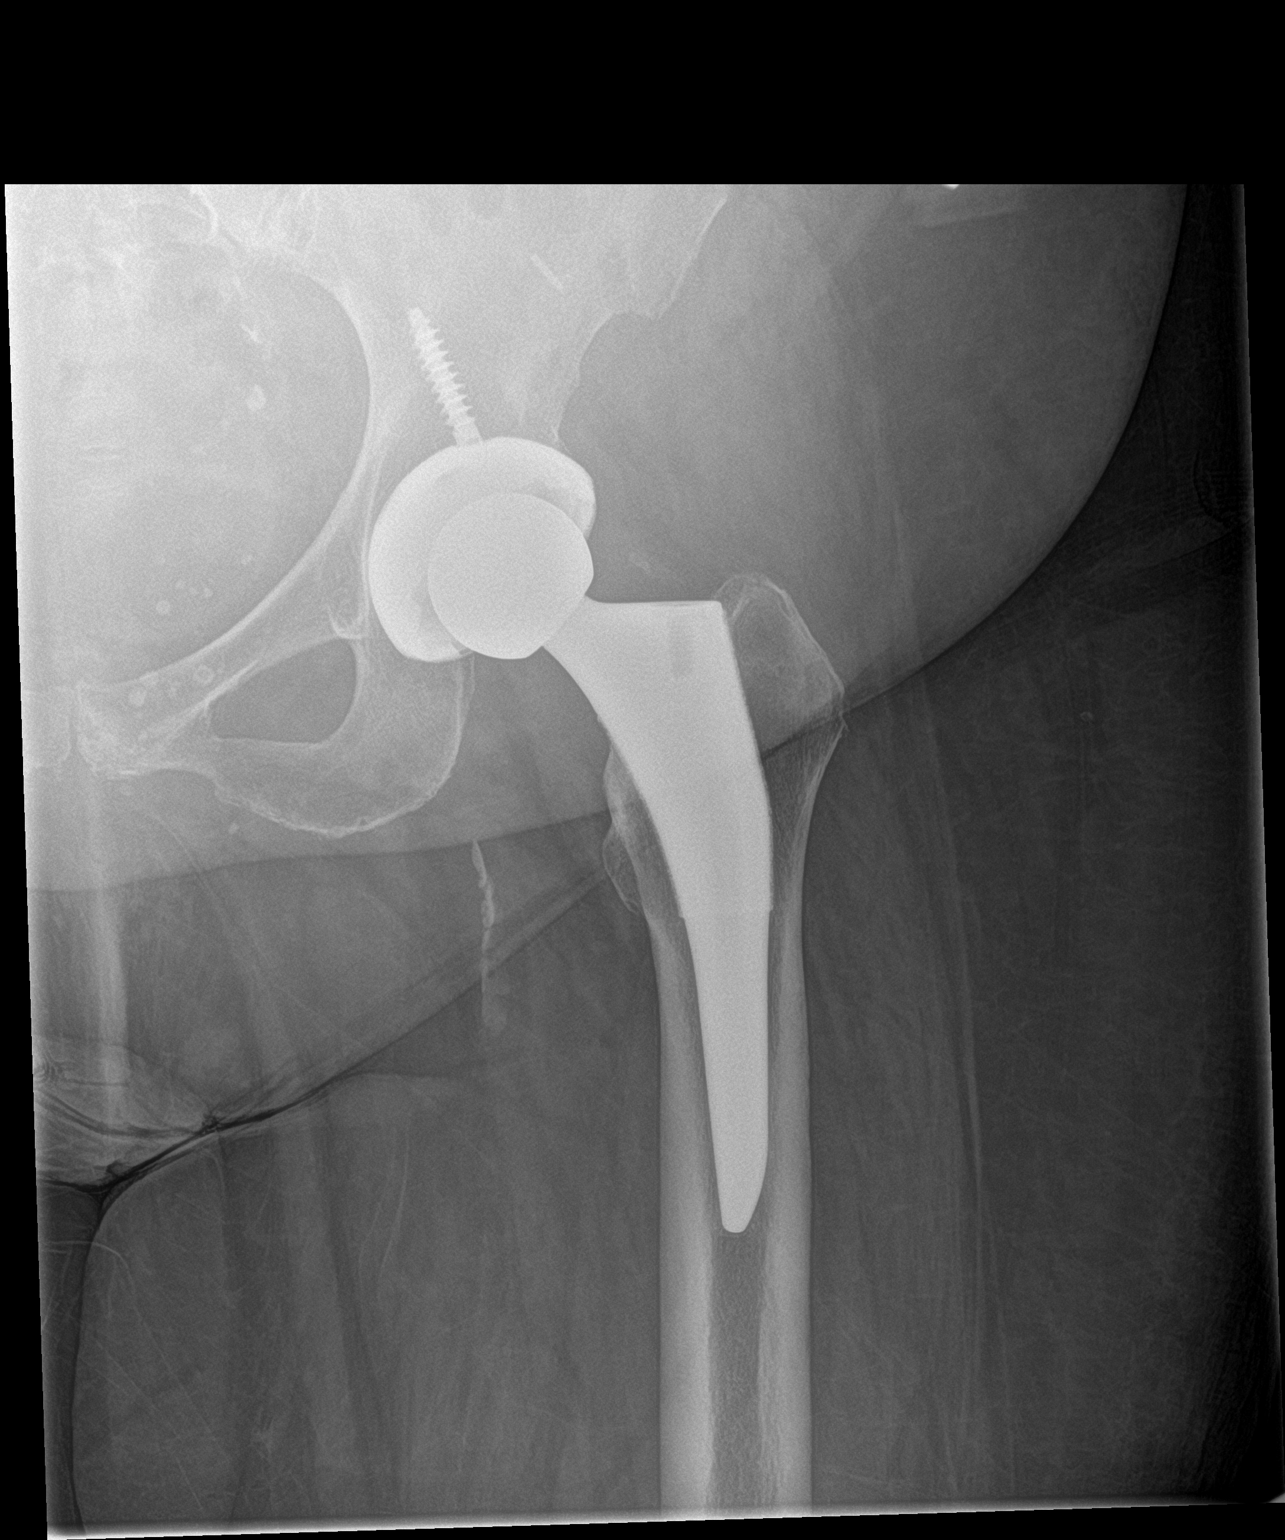

[hip lat]
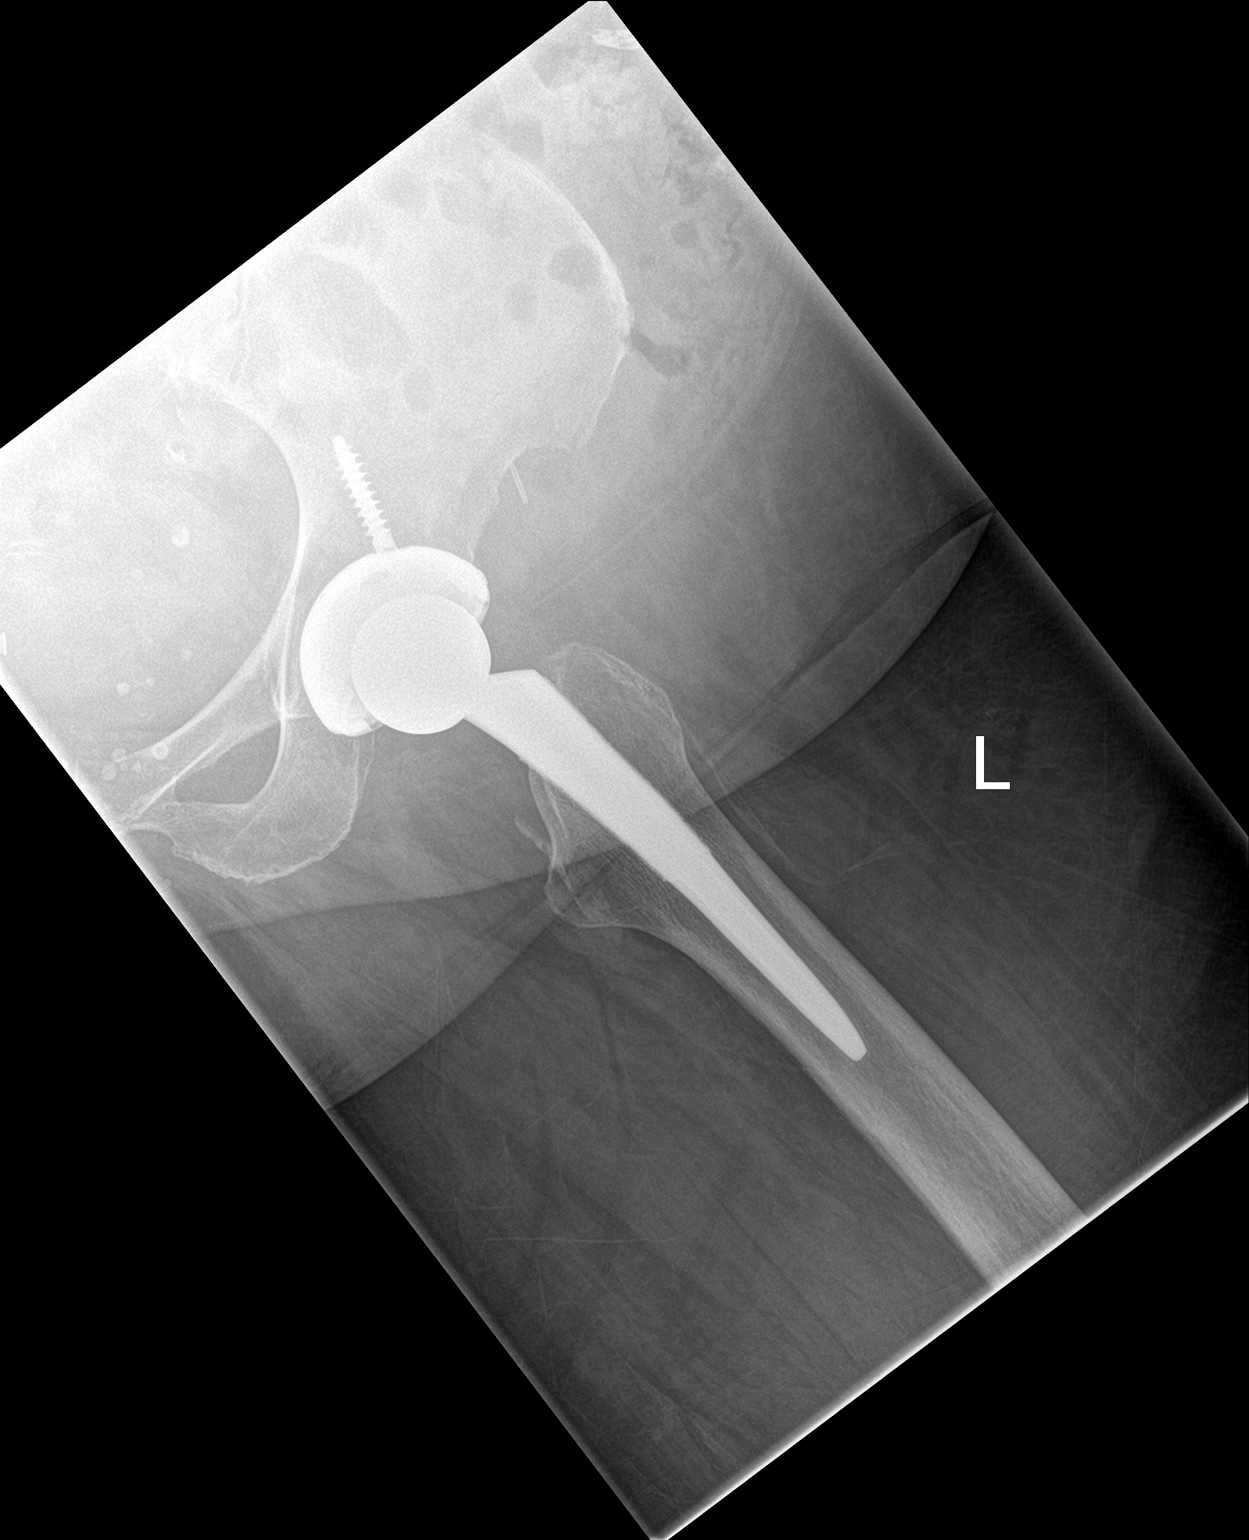

[3 of 3 positions shown; findings below may reference images not displayed]

FINDINGS: Status post bilateral total hip arthroplasties. No fracture or
dislocation is noted.
IMPRESSION: No acute abnormality seen involving the left hip.

## 2018-05-15 DIAGNOSIS — N390 Urinary tract infection, site not specified: Secondary | ICD-10-CM | POA: Diagnosis not present

## 2018-05-15 DIAGNOSIS — M159 Polyosteoarthritis, unspecified: Secondary | ICD-10-CM | POA: Diagnosis not present

## 2018-05-15 DIAGNOSIS — R2689 Other abnormalities of gait and mobility: Secondary | ICD-10-CM | POA: Diagnosis not present

## 2018-05-15 DIAGNOSIS — Z9181 History of falling: Secondary | ICD-10-CM | POA: Diagnosis not present

## 2018-05-15 DIAGNOSIS — M6281 Muscle weakness (generalized): Secondary | ICD-10-CM | POA: Diagnosis not present

## 2018-05-15 DIAGNOSIS — F0391 Unspecified dementia with behavioral disturbance: Secondary | ICD-10-CM | POA: Diagnosis not present

## 2018-05-16 DIAGNOSIS — Z9181 History of falling: Secondary | ICD-10-CM | POA: Diagnosis not present

## 2018-05-16 DIAGNOSIS — R2689 Other abnormalities of gait and mobility: Secondary | ICD-10-CM | POA: Diagnosis not present

## 2018-05-16 DIAGNOSIS — M159 Polyosteoarthritis, unspecified: Secondary | ICD-10-CM | POA: Diagnosis not present

## 2018-05-16 DIAGNOSIS — F0391 Unspecified dementia with behavioral disturbance: Secondary | ICD-10-CM | POA: Diagnosis not present

## 2018-05-16 DIAGNOSIS — N390 Urinary tract infection, site not specified: Secondary | ICD-10-CM | POA: Diagnosis not present

## 2018-05-16 DIAGNOSIS — M6281 Muscle weakness (generalized): Secondary | ICD-10-CM | POA: Diagnosis not present

## 2018-05-17 DIAGNOSIS — F5105 Insomnia due to other mental disorder: Secondary | ICD-10-CM | POA: Diagnosis not present

## 2018-05-17 DIAGNOSIS — Z9181 History of falling: Secondary | ICD-10-CM | POA: Diagnosis not present

## 2018-05-17 DIAGNOSIS — F330 Major depressive disorder, recurrent, mild: Secondary | ICD-10-CM | POA: Diagnosis not present

## 2018-05-17 DIAGNOSIS — M6281 Muscle weakness (generalized): Secondary | ICD-10-CM | POA: Diagnosis not present

## 2018-05-17 DIAGNOSIS — F0151 Vascular dementia with behavioral disturbance: Secondary | ICD-10-CM | POA: Diagnosis not present

## 2018-05-17 DIAGNOSIS — F0391 Unspecified dementia with behavioral disturbance: Secondary | ICD-10-CM | POA: Diagnosis not present

## 2018-05-17 DIAGNOSIS — R2689 Other abnormalities of gait and mobility: Secondary | ICD-10-CM | POA: Diagnosis not present

## 2018-05-17 DIAGNOSIS — N390 Urinary tract infection, site not specified: Secondary | ICD-10-CM | POA: Diagnosis not present

## 2018-05-17 DIAGNOSIS — M159 Polyosteoarthritis, unspecified: Secondary | ICD-10-CM | POA: Diagnosis not present

## 2018-05-18 DIAGNOSIS — N390 Urinary tract infection, site not specified: Secondary | ICD-10-CM | POA: Diagnosis not present

## 2018-05-18 DIAGNOSIS — Z9181 History of falling: Secondary | ICD-10-CM | POA: Diagnosis not present

## 2018-05-18 DIAGNOSIS — F0391 Unspecified dementia with behavioral disturbance: Secondary | ICD-10-CM | POA: Diagnosis not present

## 2018-05-18 DIAGNOSIS — R2689 Other abnormalities of gait and mobility: Secondary | ICD-10-CM | POA: Diagnosis not present

## 2018-05-18 DIAGNOSIS — M159 Polyosteoarthritis, unspecified: Secondary | ICD-10-CM | POA: Diagnosis not present

## 2018-05-18 DIAGNOSIS — M6281 Muscle weakness (generalized): Secondary | ICD-10-CM | POA: Diagnosis not present

## 2018-05-21 DIAGNOSIS — N390 Urinary tract infection, site not specified: Secondary | ICD-10-CM | POA: Diagnosis not present

## 2018-05-21 DIAGNOSIS — M6281 Muscle weakness (generalized): Secondary | ICD-10-CM | POA: Diagnosis not present

## 2018-05-21 DIAGNOSIS — M159 Polyosteoarthritis, unspecified: Secondary | ICD-10-CM | POA: Diagnosis not present

## 2018-05-21 DIAGNOSIS — F0391 Unspecified dementia with behavioral disturbance: Secondary | ICD-10-CM | POA: Diagnosis not present

## 2018-05-21 DIAGNOSIS — R2689 Other abnormalities of gait and mobility: Secondary | ICD-10-CM | POA: Diagnosis not present

## 2018-05-21 DIAGNOSIS — Z9181 History of falling: Secondary | ICD-10-CM | POA: Diagnosis not present

## 2018-05-22 DIAGNOSIS — R2689 Other abnormalities of gait and mobility: Secondary | ICD-10-CM | POA: Diagnosis not present

## 2018-05-22 DIAGNOSIS — M159 Polyosteoarthritis, unspecified: Secondary | ICD-10-CM | POA: Diagnosis not present

## 2018-05-22 DIAGNOSIS — I5020 Unspecified systolic (congestive) heart failure: Secondary | ICD-10-CM | POA: Diagnosis not present

## 2018-05-22 DIAGNOSIS — I10 Essential (primary) hypertension: Secondary | ICD-10-CM | POA: Diagnosis not present

## 2018-05-22 DIAGNOSIS — M6281 Muscle weakness (generalized): Secondary | ICD-10-CM | POA: Diagnosis not present

## 2018-05-22 DIAGNOSIS — E1140 Type 2 diabetes mellitus with diabetic neuropathy, unspecified: Secondary | ICD-10-CM | POA: Diagnosis not present

## 2018-05-22 DIAGNOSIS — N183 Chronic kidney disease, stage 3 (moderate): Secondary | ICD-10-CM | POA: Diagnosis not present

## 2018-05-22 DIAGNOSIS — F0391 Unspecified dementia with behavioral disturbance: Secondary | ICD-10-CM | POA: Diagnosis not present

## 2018-05-22 DIAGNOSIS — Z9181 History of falling: Secondary | ICD-10-CM | POA: Diagnosis not present

## 2018-05-22 DIAGNOSIS — I4891 Unspecified atrial fibrillation: Secondary | ICD-10-CM | POA: Diagnosis not present

## 2018-05-22 DIAGNOSIS — I25118 Atherosclerotic heart disease of native coronary artery with other forms of angina pectoris: Secondary | ICD-10-CM | POA: Diagnosis not present

## 2018-05-22 DIAGNOSIS — N390 Urinary tract infection, site not specified: Secondary | ICD-10-CM | POA: Diagnosis not present

## 2018-05-23 DIAGNOSIS — N390 Urinary tract infection, site not specified: Secondary | ICD-10-CM | POA: Diagnosis not present

## 2018-05-23 DIAGNOSIS — R2689 Other abnormalities of gait and mobility: Secondary | ICD-10-CM | POA: Diagnosis not present

## 2018-05-23 DIAGNOSIS — M159 Polyosteoarthritis, unspecified: Secondary | ICD-10-CM | POA: Diagnosis not present

## 2018-05-23 DIAGNOSIS — M6281 Muscle weakness (generalized): Secondary | ICD-10-CM | POA: Diagnosis not present

## 2018-05-23 DIAGNOSIS — Z9181 History of falling: Secondary | ICD-10-CM | POA: Diagnosis not present

## 2018-05-23 DIAGNOSIS — F0391 Unspecified dementia with behavioral disturbance: Secondary | ICD-10-CM | POA: Diagnosis not present

## 2018-05-24 DIAGNOSIS — R2689 Other abnormalities of gait and mobility: Secondary | ICD-10-CM | POA: Diagnosis not present

## 2018-05-24 DIAGNOSIS — M6281 Muscle weakness (generalized): Secondary | ICD-10-CM | POA: Diagnosis not present

## 2018-05-24 DIAGNOSIS — N390 Urinary tract infection, site not specified: Secondary | ICD-10-CM | POA: Diagnosis not present

## 2018-05-24 DIAGNOSIS — M159 Polyosteoarthritis, unspecified: Secondary | ICD-10-CM | POA: Diagnosis not present

## 2018-05-24 DIAGNOSIS — Z9181 History of falling: Secondary | ICD-10-CM | POA: Diagnosis not present

## 2018-05-24 DIAGNOSIS — F0391 Unspecified dementia with behavioral disturbance: Secondary | ICD-10-CM | POA: Diagnosis not present

## 2018-05-25 DIAGNOSIS — F0391 Unspecified dementia with behavioral disturbance: Secondary | ICD-10-CM | POA: Diagnosis not present

## 2018-05-25 DIAGNOSIS — Z9181 History of falling: Secondary | ICD-10-CM | POA: Diagnosis not present

## 2018-05-25 DIAGNOSIS — N390 Urinary tract infection, site not specified: Secondary | ICD-10-CM | POA: Diagnosis not present

## 2018-05-25 DIAGNOSIS — M6281 Muscle weakness (generalized): Secondary | ICD-10-CM | POA: Diagnosis not present

## 2018-05-25 DIAGNOSIS — R2689 Other abnormalities of gait and mobility: Secondary | ICD-10-CM | POA: Diagnosis not present

## 2018-05-25 DIAGNOSIS — M159 Polyosteoarthritis, unspecified: Secondary | ICD-10-CM | POA: Diagnosis not present

## 2018-05-28 DIAGNOSIS — M6281 Muscle weakness (generalized): Secondary | ICD-10-CM | POA: Diagnosis not present

## 2018-05-28 DIAGNOSIS — R2689 Other abnormalities of gait and mobility: Secondary | ICD-10-CM | POA: Diagnosis not present

## 2018-05-28 DIAGNOSIS — Z9181 History of falling: Secondary | ICD-10-CM | POA: Diagnosis not present

## 2018-05-28 DIAGNOSIS — N390 Urinary tract infection, site not specified: Secondary | ICD-10-CM | POA: Diagnosis not present

## 2018-05-28 DIAGNOSIS — F0391 Unspecified dementia with behavioral disturbance: Secondary | ICD-10-CM | POA: Diagnosis not present

## 2018-05-28 DIAGNOSIS — M159 Polyosteoarthritis, unspecified: Secondary | ICD-10-CM | POA: Diagnosis not present

## 2018-05-29 DIAGNOSIS — R2689 Other abnormalities of gait and mobility: Secondary | ICD-10-CM | POA: Diagnosis not present

## 2018-05-29 DIAGNOSIS — F0391 Unspecified dementia with behavioral disturbance: Secondary | ICD-10-CM | POA: Diagnosis not present

## 2018-05-29 DIAGNOSIS — M6281 Muscle weakness (generalized): Secondary | ICD-10-CM | POA: Diagnosis not present

## 2018-05-29 DIAGNOSIS — M159 Polyosteoarthritis, unspecified: Secondary | ICD-10-CM | POA: Diagnosis not present

## 2018-05-29 DIAGNOSIS — N390 Urinary tract infection, site not specified: Secondary | ICD-10-CM | POA: Diagnosis not present

## 2018-05-29 DIAGNOSIS — Z9181 History of falling: Secondary | ICD-10-CM | POA: Diagnosis not present

## 2018-05-30 DIAGNOSIS — N390 Urinary tract infection, site not specified: Secondary | ICD-10-CM | POA: Diagnosis not present

## 2018-05-30 DIAGNOSIS — M6281 Muscle weakness (generalized): Secondary | ICD-10-CM | POA: Diagnosis not present

## 2018-05-30 DIAGNOSIS — R2689 Other abnormalities of gait and mobility: Secondary | ICD-10-CM | POA: Diagnosis not present

## 2018-05-30 DIAGNOSIS — Z9181 History of falling: Secondary | ICD-10-CM | POA: Diagnosis not present

## 2018-05-30 DIAGNOSIS — M159 Polyosteoarthritis, unspecified: Secondary | ICD-10-CM | POA: Diagnosis not present

## 2018-05-30 DIAGNOSIS — F0391 Unspecified dementia with behavioral disturbance: Secondary | ICD-10-CM | POA: Diagnosis not present

## 2018-05-31 DIAGNOSIS — M159 Polyosteoarthritis, unspecified: Secondary | ICD-10-CM | POA: Diagnosis not present

## 2018-05-31 DIAGNOSIS — Z9181 History of falling: Secondary | ICD-10-CM | POA: Diagnosis not present

## 2018-05-31 DIAGNOSIS — F0391 Unspecified dementia with behavioral disturbance: Secondary | ICD-10-CM | POA: Diagnosis not present

## 2018-05-31 DIAGNOSIS — N390 Urinary tract infection, site not specified: Secondary | ICD-10-CM | POA: Diagnosis not present

## 2018-05-31 DIAGNOSIS — M6281 Muscle weakness (generalized): Secondary | ICD-10-CM | POA: Diagnosis not present

## 2018-05-31 DIAGNOSIS — R2689 Other abnormalities of gait and mobility: Secondary | ICD-10-CM | POA: Diagnosis not present

## 2018-06-01 DIAGNOSIS — N390 Urinary tract infection, site not specified: Secondary | ICD-10-CM | POA: Diagnosis not present

## 2018-06-01 DIAGNOSIS — M159 Polyosteoarthritis, unspecified: Secondary | ICD-10-CM | POA: Diagnosis not present

## 2018-06-01 DIAGNOSIS — Z9181 History of falling: Secondary | ICD-10-CM | POA: Diagnosis not present

## 2018-06-01 DIAGNOSIS — F0391 Unspecified dementia with behavioral disturbance: Secondary | ICD-10-CM | POA: Diagnosis not present

## 2018-06-01 DIAGNOSIS — M6281 Muscle weakness (generalized): Secondary | ICD-10-CM | POA: Diagnosis not present

## 2018-06-01 DIAGNOSIS — R2689 Other abnormalities of gait and mobility: Secondary | ICD-10-CM | POA: Diagnosis not present

## 2018-06-04 DIAGNOSIS — Z9181 History of falling: Secondary | ICD-10-CM | POA: Diagnosis not present

## 2018-06-04 DIAGNOSIS — M159 Polyosteoarthritis, unspecified: Secondary | ICD-10-CM | POA: Diagnosis not present

## 2018-06-04 DIAGNOSIS — F0391 Unspecified dementia with behavioral disturbance: Secondary | ICD-10-CM | POA: Diagnosis not present

## 2018-06-04 DIAGNOSIS — N390 Urinary tract infection, site not specified: Secondary | ICD-10-CM | POA: Diagnosis not present

## 2018-06-04 DIAGNOSIS — R2689 Other abnormalities of gait and mobility: Secondary | ICD-10-CM | POA: Diagnosis not present

## 2018-06-04 DIAGNOSIS — M6281 Muscle weakness (generalized): Secondary | ICD-10-CM | POA: Diagnosis not present

## 2018-06-05 DIAGNOSIS — N390 Urinary tract infection, site not specified: Secondary | ICD-10-CM | POA: Diagnosis not present

## 2018-06-05 DIAGNOSIS — M159 Polyosteoarthritis, unspecified: Secondary | ICD-10-CM | POA: Diagnosis not present

## 2018-06-05 DIAGNOSIS — R2689 Other abnormalities of gait and mobility: Secondary | ICD-10-CM | POA: Diagnosis not present

## 2018-06-05 DIAGNOSIS — M6281 Muscle weakness (generalized): Secondary | ICD-10-CM | POA: Diagnosis not present

## 2018-06-05 DIAGNOSIS — Z9181 History of falling: Secondary | ICD-10-CM | POA: Diagnosis not present

## 2018-06-05 DIAGNOSIS — F0391 Unspecified dementia with behavioral disturbance: Secondary | ICD-10-CM | POA: Diagnosis not present

## 2018-06-06 DIAGNOSIS — M159 Polyosteoarthritis, unspecified: Secondary | ICD-10-CM | POA: Diagnosis not present

## 2018-06-06 DIAGNOSIS — R2689 Other abnormalities of gait and mobility: Secondary | ICD-10-CM | POA: Diagnosis not present

## 2018-06-06 DIAGNOSIS — N390 Urinary tract infection, site not specified: Secondary | ICD-10-CM | POA: Diagnosis not present

## 2018-06-06 DIAGNOSIS — Z9181 History of falling: Secondary | ICD-10-CM | POA: Diagnosis not present

## 2018-06-06 DIAGNOSIS — M6281 Muscle weakness (generalized): Secondary | ICD-10-CM | POA: Diagnosis not present

## 2018-06-06 DIAGNOSIS — F0391 Unspecified dementia with behavioral disturbance: Secondary | ICD-10-CM | POA: Diagnosis not present

## 2018-06-07 DIAGNOSIS — Z9181 History of falling: Secondary | ICD-10-CM | POA: Diagnosis not present

## 2018-06-07 DIAGNOSIS — R2689 Other abnormalities of gait and mobility: Secondary | ICD-10-CM | POA: Diagnosis not present

## 2018-06-07 DIAGNOSIS — N390 Urinary tract infection, site not specified: Secondary | ICD-10-CM | POA: Diagnosis not present

## 2018-06-07 DIAGNOSIS — M6281 Muscle weakness (generalized): Secondary | ICD-10-CM | POA: Diagnosis not present

## 2018-06-07 DIAGNOSIS — M159 Polyosteoarthritis, unspecified: Secondary | ICD-10-CM | POA: Diagnosis not present

## 2018-06-07 DIAGNOSIS — F0391 Unspecified dementia with behavioral disturbance: Secondary | ICD-10-CM | POA: Diagnosis not present

## 2018-06-08 DIAGNOSIS — M6281 Muscle weakness (generalized): Secondary | ICD-10-CM | POA: Diagnosis not present

## 2018-06-08 DIAGNOSIS — N390 Urinary tract infection, site not specified: Secondary | ICD-10-CM | POA: Diagnosis not present

## 2018-06-08 DIAGNOSIS — R2689 Other abnormalities of gait and mobility: Secondary | ICD-10-CM | POA: Diagnosis not present

## 2018-06-08 DIAGNOSIS — Z9181 History of falling: Secondary | ICD-10-CM | POA: Diagnosis not present

## 2018-06-08 DIAGNOSIS — F0391 Unspecified dementia with behavioral disturbance: Secondary | ICD-10-CM | POA: Diagnosis not present

## 2018-06-08 DIAGNOSIS — M159 Polyosteoarthritis, unspecified: Secondary | ICD-10-CM | POA: Diagnosis not present

## 2018-06-11 DIAGNOSIS — M6281 Muscle weakness (generalized): Secondary | ICD-10-CM | POA: Diagnosis not present

## 2018-06-11 DIAGNOSIS — N390 Urinary tract infection, site not specified: Secondary | ICD-10-CM | POA: Diagnosis not present

## 2018-06-11 DIAGNOSIS — R2689 Other abnormalities of gait and mobility: Secondary | ICD-10-CM | POA: Diagnosis not present

## 2018-06-11 DIAGNOSIS — Z9181 History of falling: Secondary | ICD-10-CM | POA: Diagnosis not present

## 2018-06-11 DIAGNOSIS — F0391 Unspecified dementia with behavioral disturbance: Secondary | ICD-10-CM | POA: Diagnosis not present

## 2018-06-11 DIAGNOSIS — M159 Polyosteoarthritis, unspecified: Secondary | ICD-10-CM | POA: Diagnosis not present

## 2018-06-12 DIAGNOSIS — R2689 Other abnormalities of gait and mobility: Secondary | ICD-10-CM | POA: Diagnosis not present

## 2018-06-12 DIAGNOSIS — Z9181 History of falling: Secondary | ICD-10-CM | POA: Diagnosis not present

## 2018-06-12 DIAGNOSIS — M6281 Muscle weakness (generalized): Secondary | ICD-10-CM | POA: Diagnosis not present

## 2018-06-12 DIAGNOSIS — F0391 Unspecified dementia with behavioral disturbance: Secondary | ICD-10-CM | POA: Diagnosis not present

## 2018-06-12 DIAGNOSIS — M159 Polyosteoarthritis, unspecified: Secondary | ICD-10-CM | POA: Diagnosis not present

## 2018-06-12 DIAGNOSIS — N390 Urinary tract infection, site not specified: Secondary | ICD-10-CM | POA: Diagnosis not present

## 2018-06-13 DIAGNOSIS — R2689 Other abnormalities of gait and mobility: Secondary | ICD-10-CM | POA: Diagnosis not present

## 2018-06-13 DIAGNOSIS — M159 Polyosteoarthritis, unspecified: Secondary | ICD-10-CM | POA: Diagnosis not present

## 2018-06-13 DIAGNOSIS — M6281 Muscle weakness (generalized): Secondary | ICD-10-CM | POA: Diagnosis not present

## 2018-06-13 DIAGNOSIS — F0391 Unspecified dementia with behavioral disturbance: Secondary | ICD-10-CM | POA: Diagnosis not present

## 2018-06-13 DIAGNOSIS — Z9181 History of falling: Secondary | ICD-10-CM | POA: Diagnosis not present

## 2018-06-13 DIAGNOSIS — N390 Urinary tract infection, site not specified: Secondary | ICD-10-CM | POA: Diagnosis not present

## 2018-06-14 DIAGNOSIS — F330 Major depressive disorder, recurrent, mild: Secondary | ICD-10-CM | POA: Diagnosis not present

## 2018-06-14 DIAGNOSIS — F5105 Insomnia due to other mental disorder: Secondary | ICD-10-CM | POA: Diagnosis not present

## 2018-06-14 DIAGNOSIS — F0151 Vascular dementia with behavioral disturbance: Secondary | ICD-10-CM | POA: Diagnosis not present

## 2018-06-26 DIAGNOSIS — L853 Xerosis cutis: Secondary | ICD-10-CM | POA: Diagnosis not present

## 2018-06-26 DIAGNOSIS — R600 Localized edema: Secondary | ICD-10-CM | POA: Diagnosis not present

## 2018-06-26 DIAGNOSIS — M2010 Hallux valgus (acquired), unspecified foot: Secondary | ICD-10-CM | POA: Diagnosis not present

## 2018-06-26 DIAGNOSIS — E1140 Type 2 diabetes mellitus with diabetic neuropathy, unspecified: Secondary | ICD-10-CM | POA: Diagnosis not present

## 2018-06-26 DIAGNOSIS — B351 Tinea unguium: Secondary | ICD-10-CM | POA: Diagnosis not present

## 2018-06-26 DIAGNOSIS — Q845 Enlarged and hypertrophic nails: Secondary | ICD-10-CM | POA: Diagnosis not present

## 2018-06-26 DIAGNOSIS — I739 Peripheral vascular disease, unspecified: Secondary | ICD-10-CM | POA: Diagnosis not present

## 2018-06-26 DIAGNOSIS — L603 Nail dystrophy: Secondary | ICD-10-CM | POA: Diagnosis not present

## 2018-06-27 DIAGNOSIS — I25118 Atherosclerotic heart disease of native coronary artery with other forms of angina pectoris: Secondary | ICD-10-CM | POA: Diagnosis not present

## 2018-06-27 DIAGNOSIS — N182 Chronic kidney disease, stage 2 (mild): Secondary | ICD-10-CM | POA: Diagnosis not present

## 2018-06-27 DIAGNOSIS — K219 Gastro-esophageal reflux disease without esophagitis: Secondary | ICD-10-CM | POA: Diagnosis not present

## 2018-06-27 DIAGNOSIS — R54 Age-related physical debility: Secondary | ICD-10-CM | POA: Diagnosis not present

## 2018-06-27 DIAGNOSIS — E1140 Type 2 diabetes mellitus with diabetic neuropathy, unspecified: Secondary | ICD-10-CM | POA: Diagnosis not present

## 2018-06-27 DIAGNOSIS — G8929 Other chronic pain: Secondary | ICD-10-CM | POA: Diagnosis not present

## 2018-06-27 DIAGNOSIS — I4891 Unspecified atrial fibrillation: Secondary | ICD-10-CM | POA: Diagnosis not present

## 2018-06-27 DIAGNOSIS — D649 Anemia, unspecified: Secondary | ICD-10-CM | POA: Diagnosis not present

## 2018-06-27 DIAGNOSIS — I5020 Unspecified systolic (congestive) heart failure: Secondary | ICD-10-CM | POA: Diagnosis not present

## 2018-06-28 DIAGNOSIS — F5105 Insomnia due to other mental disorder: Secondary | ICD-10-CM | POA: Diagnosis not present

## 2018-06-28 DIAGNOSIS — F330 Major depressive disorder, recurrent, mild: Secondary | ICD-10-CM | POA: Diagnosis not present

## 2018-06-28 DIAGNOSIS — F0151 Vascular dementia with behavioral disturbance: Secondary | ICD-10-CM | POA: Diagnosis not present

## 2018-07-04 DIAGNOSIS — E559 Vitamin D deficiency, unspecified: Secondary | ICD-10-CM | POA: Diagnosis not present

## 2018-07-04 DIAGNOSIS — E039 Hypothyroidism, unspecified: Secondary | ICD-10-CM | POA: Diagnosis not present

## 2018-07-12 DIAGNOSIS — F330 Major depressive disorder, recurrent, mild: Secondary | ICD-10-CM | POA: Diagnosis not present

## 2018-07-12 DIAGNOSIS — F5105 Insomnia due to other mental disorder: Secondary | ICD-10-CM | POA: Diagnosis not present

## 2018-07-12 DIAGNOSIS — F0151 Vascular dementia with behavioral disturbance: Secondary | ICD-10-CM | POA: Diagnosis not present

## 2018-07-26 DIAGNOSIS — F5105 Insomnia due to other mental disorder: Secondary | ICD-10-CM | POA: Diagnosis not present

## 2018-07-26 DIAGNOSIS — F330 Major depressive disorder, recurrent, mild: Secondary | ICD-10-CM | POA: Diagnosis not present

## 2018-07-26 DIAGNOSIS — F0151 Vascular dementia with behavioral disturbance: Secondary | ICD-10-CM | POA: Diagnosis not present

## 2018-08-03 DIAGNOSIS — R319 Hematuria, unspecified: Secondary | ICD-10-CM | POA: Diagnosis not present

## 2018-08-03 DIAGNOSIS — M19022 Primary osteoarthritis, left elbow: Secondary | ICD-10-CM | POA: Diagnosis not present

## 2018-08-03 DIAGNOSIS — Z886 Allergy status to analgesic agent status: Secondary | ICD-10-CM | POA: Diagnosis not present

## 2018-08-03 DIAGNOSIS — I10 Essential (primary) hypertension: Secondary | ICD-10-CM | POA: Diagnosis not present

## 2018-08-03 DIAGNOSIS — W19XXXA Unspecified fall, initial encounter: Secondary | ICD-10-CM | POA: Diagnosis not present

## 2018-08-03 DIAGNOSIS — N390 Urinary tract infection, site not specified: Secondary | ICD-10-CM | POA: Diagnosis not present

## 2018-08-03 DIAGNOSIS — Z950 Presence of cardiac pacemaker: Secondary | ICD-10-CM | POA: Diagnosis not present

## 2018-08-03 DIAGNOSIS — S0990XA Unspecified injury of head, initial encounter: Secondary | ICD-10-CM | POA: Diagnosis not present

## 2018-08-03 DIAGNOSIS — E7800 Pure hypercholesterolemia, unspecified: Secondary | ICD-10-CM | POA: Diagnosis not present

## 2018-08-03 DIAGNOSIS — S0090XA Unspecified superficial injury of unspecified part of head, initial encounter: Secondary | ICD-10-CM | POA: Diagnosis not present

## 2018-08-03 DIAGNOSIS — Z79899 Other long term (current) drug therapy: Secondary | ICD-10-CM | POA: Diagnosis not present

## 2018-08-03 DIAGNOSIS — Z7982 Long term (current) use of aspirin: Secondary | ICD-10-CM | POA: Diagnosis not present

## 2018-08-03 DIAGNOSIS — R531 Weakness: Secondary | ICD-10-CM | POA: Diagnosis not present

## 2018-08-03 DIAGNOSIS — R52 Pain, unspecified: Secondary | ICD-10-CM | POA: Diagnosis not present

## 2018-08-03 DIAGNOSIS — S0080XA Unspecified superficial injury of other part of head, initial encounter: Secondary | ICD-10-CM | POA: Diagnosis not present

## 2018-08-03 DIAGNOSIS — E119 Type 2 diabetes mellitus without complications: Secondary | ICD-10-CM | POA: Diagnosis not present

## 2018-08-03 DIAGNOSIS — S52125A Nondisplaced fracture of head of left radius, initial encounter for closed fracture: Secondary | ICD-10-CM | POA: Diagnosis not present

## 2018-08-03 DIAGNOSIS — R1084 Generalized abdominal pain: Secondary | ICD-10-CM | POA: Diagnosis not present

## 2018-08-07 DIAGNOSIS — R002 Palpitations: Secondary | ICD-10-CM | POA: Diagnosis not present

## 2018-08-08 DIAGNOSIS — N390 Urinary tract infection, site not specified: Secondary | ICD-10-CM | POA: Diagnosis not present

## 2018-08-08 DIAGNOSIS — M6281 Muscle weakness (generalized): Secondary | ICD-10-CM | POA: Diagnosis not present

## 2018-08-08 DIAGNOSIS — F0391 Unspecified dementia with behavioral disturbance: Secondary | ICD-10-CM | POA: Diagnosis not present

## 2018-08-08 DIAGNOSIS — Z9181 History of falling: Secondary | ICD-10-CM | POA: Diagnosis not present

## 2018-08-08 DIAGNOSIS — R2689 Other abnormalities of gait and mobility: Secondary | ICD-10-CM | POA: Diagnosis not present

## 2018-08-08 DIAGNOSIS — M159 Polyosteoarthritis, unspecified: Secondary | ICD-10-CM | POA: Diagnosis not present

## 2018-08-09 DIAGNOSIS — M159 Polyosteoarthritis, unspecified: Secondary | ICD-10-CM | POA: Diagnosis not present

## 2018-08-09 DIAGNOSIS — R2689 Other abnormalities of gait and mobility: Secondary | ICD-10-CM | POA: Diagnosis not present

## 2018-08-09 DIAGNOSIS — F5105 Insomnia due to other mental disorder: Secondary | ICD-10-CM | POA: Diagnosis not present

## 2018-08-09 DIAGNOSIS — F0151 Vascular dementia with behavioral disturbance: Secondary | ICD-10-CM | POA: Diagnosis not present

## 2018-08-09 DIAGNOSIS — N390 Urinary tract infection, site not specified: Secondary | ICD-10-CM | POA: Diagnosis not present

## 2018-08-09 DIAGNOSIS — M6281 Muscle weakness (generalized): Secondary | ICD-10-CM | POA: Diagnosis not present

## 2018-08-09 DIAGNOSIS — F330 Major depressive disorder, recurrent, mild: Secondary | ICD-10-CM | POA: Diagnosis not present

## 2018-08-09 DIAGNOSIS — Z9181 History of falling: Secondary | ICD-10-CM | POA: Diagnosis not present

## 2018-08-09 DIAGNOSIS — F0391 Unspecified dementia with behavioral disturbance: Secondary | ICD-10-CM | POA: Diagnosis not present

## 2018-08-10 DIAGNOSIS — N390 Urinary tract infection, site not specified: Secondary | ICD-10-CM | POA: Diagnosis not present

## 2018-08-10 DIAGNOSIS — R2689 Other abnormalities of gait and mobility: Secondary | ICD-10-CM | POA: Diagnosis not present

## 2018-08-10 DIAGNOSIS — F0391 Unspecified dementia with behavioral disturbance: Secondary | ICD-10-CM | POA: Diagnosis not present

## 2018-08-10 DIAGNOSIS — M159 Polyosteoarthritis, unspecified: Secondary | ICD-10-CM | POA: Diagnosis not present

## 2018-08-10 DIAGNOSIS — M6281 Muscle weakness (generalized): Secondary | ICD-10-CM | POA: Diagnosis not present

## 2018-08-10 DIAGNOSIS — Z9181 History of falling: Secondary | ICD-10-CM | POA: Diagnosis not present

## 2018-08-11 DIAGNOSIS — F0391 Unspecified dementia with behavioral disturbance: Secondary | ICD-10-CM | POA: Diagnosis not present

## 2018-08-11 DIAGNOSIS — Z9181 History of falling: Secondary | ICD-10-CM | POA: Diagnosis not present

## 2018-08-11 DIAGNOSIS — N390 Urinary tract infection, site not specified: Secondary | ICD-10-CM | POA: Diagnosis not present

## 2018-08-11 DIAGNOSIS — M159 Polyosteoarthritis, unspecified: Secondary | ICD-10-CM | POA: Diagnosis not present

## 2018-08-11 DIAGNOSIS — R2689 Other abnormalities of gait and mobility: Secondary | ICD-10-CM | POA: Diagnosis not present

## 2018-08-11 DIAGNOSIS — M6281 Muscle weakness (generalized): Secondary | ICD-10-CM | POA: Diagnosis not present

## 2018-08-13 DIAGNOSIS — M159 Polyosteoarthritis, unspecified: Secondary | ICD-10-CM | POA: Diagnosis not present

## 2018-08-13 DIAGNOSIS — F0391 Unspecified dementia with behavioral disturbance: Secondary | ICD-10-CM | POA: Diagnosis not present

## 2018-08-13 DIAGNOSIS — Z9181 History of falling: Secondary | ICD-10-CM | POA: Diagnosis not present

## 2018-08-13 DIAGNOSIS — N390 Urinary tract infection, site not specified: Secondary | ICD-10-CM | POA: Diagnosis not present

## 2018-08-13 DIAGNOSIS — M6281 Muscle weakness (generalized): Secondary | ICD-10-CM | POA: Diagnosis not present

## 2018-08-13 DIAGNOSIS — R2689 Other abnormalities of gait and mobility: Secondary | ICD-10-CM | POA: Diagnosis not present

## 2018-08-14 DIAGNOSIS — R2689 Other abnormalities of gait and mobility: Secondary | ICD-10-CM | POA: Diagnosis not present

## 2018-08-14 DIAGNOSIS — M6281 Muscle weakness (generalized): Secondary | ICD-10-CM | POA: Diagnosis not present

## 2018-08-14 DIAGNOSIS — Z9181 History of falling: Secondary | ICD-10-CM | POA: Diagnosis not present

## 2018-08-14 DIAGNOSIS — M159 Polyosteoarthritis, unspecified: Secondary | ICD-10-CM | POA: Diagnosis not present

## 2018-08-14 DIAGNOSIS — N390 Urinary tract infection, site not specified: Secondary | ICD-10-CM | POA: Diagnosis not present

## 2018-08-14 DIAGNOSIS — F0391 Unspecified dementia with behavioral disturbance: Secondary | ICD-10-CM | POA: Diagnosis not present

## 2018-08-15 DIAGNOSIS — F0391 Unspecified dementia with behavioral disturbance: Secondary | ICD-10-CM | POA: Diagnosis not present

## 2018-08-15 DIAGNOSIS — Z9181 History of falling: Secondary | ICD-10-CM | POA: Diagnosis not present

## 2018-08-15 DIAGNOSIS — M159 Polyosteoarthritis, unspecified: Secondary | ICD-10-CM | POA: Diagnosis not present

## 2018-08-15 DIAGNOSIS — N390 Urinary tract infection, site not specified: Secondary | ICD-10-CM | POA: Diagnosis not present

## 2018-08-15 DIAGNOSIS — M6281 Muscle weakness (generalized): Secondary | ICD-10-CM | POA: Diagnosis not present

## 2018-08-15 DIAGNOSIS — R2689 Other abnormalities of gait and mobility: Secondary | ICD-10-CM | POA: Diagnosis not present

## 2018-08-16 DIAGNOSIS — Z9181 History of falling: Secondary | ICD-10-CM | POA: Diagnosis not present

## 2018-08-16 DIAGNOSIS — M6281 Muscle weakness (generalized): Secondary | ICD-10-CM | POA: Diagnosis not present

## 2018-08-16 DIAGNOSIS — R2689 Other abnormalities of gait and mobility: Secondary | ICD-10-CM | POA: Diagnosis not present

## 2018-08-16 DIAGNOSIS — N390 Urinary tract infection, site not specified: Secondary | ICD-10-CM | POA: Diagnosis not present

## 2018-08-16 DIAGNOSIS — K5900 Constipation, unspecified: Secondary | ICD-10-CM | POA: Diagnosis not present

## 2018-08-16 DIAGNOSIS — N182 Chronic kidney disease, stage 2 (mild): Secondary | ICD-10-CM | POA: Diagnosis not present

## 2018-08-16 DIAGNOSIS — E1140 Type 2 diabetes mellitus with diabetic neuropathy, unspecified: Secondary | ICD-10-CM | POA: Diagnosis not present

## 2018-08-16 DIAGNOSIS — G8929 Other chronic pain: Secondary | ICD-10-CM | POA: Diagnosis not present

## 2018-08-16 DIAGNOSIS — K219 Gastro-esophageal reflux disease without esophagitis: Secondary | ICD-10-CM | POA: Diagnosis not present

## 2018-08-16 DIAGNOSIS — D649 Anemia, unspecified: Secondary | ICD-10-CM | POA: Diagnosis not present

## 2018-08-16 DIAGNOSIS — I4891 Unspecified atrial fibrillation: Secondary | ICD-10-CM | POA: Diagnosis not present

## 2018-08-16 DIAGNOSIS — M159 Polyosteoarthritis, unspecified: Secondary | ICD-10-CM | POA: Diagnosis not present

## 2018-08-16 DIAGNOSIS — I5020 Unspecified systolic (congestive) heart failure: Secondary | ICD-10-CM | POA: Diagnosis not present

## 2018-08-16 DIAGNOSIS — I25118 Atherosclerotic heart disease of native coronary artery with other forms of angina pectoris: Secondary | ICD-10-CM | POA: Diagnosis not present

## 2018-08-16 DIAGNOSIS — F0391 Unspecified dementia with behavioral disturbance: Secondary | ICD-10-CM | POA: Diagnosis not present

## 2018-08-17 DIAGNOSIS — N390 Urinary tract infection, site not specified: Secondary | ICD-10-CM | POA: Diagnosis not present

## 2018-08-17 DIAGNOSIS — M6281 Muscle weakness (generalized): Secondary | ICD-10-CM | POA: Diagnosis not present

## 2018-08-17 DIAGNOSIS — F0391 Unspecified dementia with behavioral disturbance: Secondary | ICD-10-CM | POA: Diagnosis not present

## 2018-08-17 DIAGNOSIS — M159 Polyosteoarthritis, unspecified: Secondary | ICD-10-CM | POA: Diagnosis not present

## 2018-08-17 DIAGNOSIS — R2689 Other abnormalities of gait and mobility: Secondary | ICD-10-CM | POA: Diagnosis not present

## 2018-08-17 DIAGNOSIS — Z9181 History of falling: Secondary | ICD-10-CM | POA: Diagnosis not present

## 2018-08-19 DIAGNOSIS — M159 Polyosteoarthritis, unspecified: Secondary | ICD-10-CM | POA: Diagnosis not present

## 2018-08-19 DIAGNOSIS — M6281 Muscle weakness (generalized): Secondary | ICD-10-CM | POA: Diagnosis not present

## 2018-08-19 DIAGNOSIS — N390 Urinary tract infection, site not specified: Secondary | ICD-10-CM | POA: Diagnosis not present

## 2018-08-19 DIAGNOSIS — R2689 Other abnormalities of gait and mobility: Secondary | ICD-10-CM | POA: Diagnosis not present

## 2018-08-19 DIAGNOSIS — F0391 Unspecified dementia with behavioral disturbance: Secondary | ICD-10-CM | POA: Diagnosis not present

## 2018-08-19 DIAGNOSIS — Z9181 History of falling: Secondary | ICD-10-CM | POA: Diagnosis not present

## 2018-08-20 DIAGNOSIS — N390 Urinary tract infection, site not specified: Secondary | ICD-10-CM | POA: Diagnosis not present

## 2018-08-20 DIAGNOSIS — M6281 Muscle weakness (generalized): Secondary | ICD-10-CM | POA: Diagnosis not present

## 2018-08-20 DIAGNOSIS — Z9181 History of falling: Secondary | ICD-10-CM | POA: Diagnosis not present

## 2018-08-20 DIAGNOSIS — R2689 Other abnormalities of gait and mobility: Secondary | ICD-10-CM | POA: Diagnosis not present

## 2018-08-20 DIAGNOSIS — M159 Polyosteoarthritis, unspecified: Secondary | ICD-10-CM | POA: Diagnosis not present

## 2018-08-20 DIAGNOSIS — F0391 Unspecified dementia with behavioral disturbance: Secondary | ICD-10-CM | POA: Diagnosis not present

## 2018-08-21 DIAGNOSIS — M6281 Muscle weakness (generalized): Secondary | ICD-10-CM | POA: Diagnosis not present

## 2018-08-21 DIAGNOSIS — F0391 Unspecified dementia with behavioral disturbance: Secondary | ICD-10-CM | POA: Diagnosis not present

## 2018-08-21 DIAGNOSIS — R2689 Other abnormalities of gait and mobility: Secondary | ICD-10-CM | POA: Diagnosis not present

## 2018-08-21 DIAGNOSIS — Z9181 History of falling: Secondary | ICD-10-CM | POA: Diagnosis not present

## 2018-08-21 DIAGNOSIS — N390 Urinary tract infection, site not specified: Secondary | ICD-10-CM | POA: Diagnosis not present

## 2018-08-21 DIAGNOSIS — M159 Polyosteoarthritis, unspecified: Secondary | ICD-10-CM | POA: Diagnosis not present

## 2018-08-22 DIAGNOSIS — R2689 Other abnormalities of gait and mobility: Secondary | ICD-10-CM | POA: Diagnosis not present

## 2018-08-22 DIAGNOSIS — R319 Hematuria, unspecified: Secondary | ICD-10-CM | POA: Diagnosis not present

## 2018-08-22 DIAGNOSIS — F0391 Unspecified dementia with behavioral disturbance: Secondary | ICD-10-CM | POA: Diagnosis not present

## 2018-08-22 DIAGNOSIS — N390 Urinary tract infection, site not specified: Secondary | ICD-10-CM | POA: Diagnosis not present

## 2018-08-22 DIAGNOSIS — M6281 Muscle weakness (generalized): Secondary | ICD-10-CM | POA: Diagnosis not present

## 2018-08-22 DIAGNOSIS — M159 Polyosteoarthritis, unspecified: Secondary | ICD-10-CM | POA: Diagnosis not present

## 2018-08-22 DIAGNOSIS — Z9181 History of falling: Secondary | ICD-10-CM | POA: Diagnosis not present

## 2018-08-23 DIAGNOSIS — M6281 Muscle weakness (generalized): Secondary | ICD-10-CM | POA: Diagnosis not present

## 2018-08-23 DIAGNOSIS — M159 Polyosteoarthritis, unspecified: Secondary | ICD-10-CM | POA: Diagnosis not present

## 2018-08-23 DIAGNOSIS — F0391 Unspecified dementia with behavioral disturbance: Secondary | ICD-10-CM | POA: Diagnosis not present

## 2018-08-23 DIAGNOSIS — N390 Urinary tract infection, site not specified: Secondary | ICD-10-CM | POA: Diagnosis not present

## 2018-08-23 DIAGNOSIS — F330 Major depressive disorder, recurrent, mild: Secondary | ICD-10-CM | POA: Diagnosis not present

## 2018-08-23 DIAGNOSIS — R2689 Other abnormalities of gait and mobility: Secondary | ICD-10-CM | POA: Diagnosis not present

## 2018-08-23 DIAGNOSIS — Z9181 History of falling: Secondary | ICD-10-CM | POA: Diagnosis not present

## 2018-08-23 DIAGNOSIS — F0151 Vascular dementia with behavioral disturbance: Secondary | ICD-10-CM | POA: Diagnosis not present

## 2018-08-23 DIAGNOSIS — F5105 Insomnia due to other mental disorder: Secondary | ICD-10-CM | POA: Diagnosis not present

## 2018-08-24 DIAGNOSIS — F0391 Unspecified dementia with behavioral disturbance: Secondary | ICD-10-CM | POA: Diagnosis not present

## 2018-08-24 DIAGNOSIS — Z9181 History of falling: Secondary | ICD-10-CM | POA: Diagnosis not present

## 2018-08-24 DIAGNOSIS — N390 Urinary tract infection, site not specified: Secondary | ICD-10-CM | POA: Diagnosis not present

## 2018-08-24 DIAGNOSIS — M159 Polyosteoarthritis, unspecified: Secondary | ICD-10-CM | POA: Diagnosis not present

## 2018-08-24 DIAGNOSIS — R2689 Other abnormalities of gait and mobility: Secondary | ICD-10-CM | POA: Diagnosis not present

## 2018-08-24 DIAGNOSIS — M6281 Muscle weakness (generalized): Secondary | ICD-10-CM | POA: Diagnosis not present

## 2018-08-27 DIAGNOSIS — E119 Type 2 diabetes mellitus without complications: Secondary | ICD-10-CM | POA: Diagnosis not present

## 2018-08-27 DIAGNOSIS — F0391 Unspecified dementia with behavioral disturbance: Secondary | ICD-10-CM | POA: Diagnosis not present

## 2018-08-27 DIAGNOSIS — H11043 Peripheral pterygium, stationary, bilateral: Secondary | ICD-10-CM | POA: Diagnosis not present

## 2018-08-27 DIAGNOSIS — Z9181 History of falling: Secondary | ICD-10-CM | POA: Diagnosis not present

## 2018-08-27 DIAGNOSIS — N390 Urinary tract infection, site not specified: Secondary | ICD-10-CM | POA: Diagnosis not present

## 2018-08-27 DIAGNOSIS — M6281 Muscle weakness (generalized): Secondary | ICD-10-CM | POA: Diagnosis not present

## 2018-08-27 DIAGNOSIS — Z7984 Long term (current) use of oral hypoglycemic drugs: Secondary | ICD-10-CM | POA: Diagnosis not present

## 2018-08-27 DIAGNOSIS — Z7901 Long term (current) use of anticoagulants: Secondary | ICD-10-CM | POA: Diagnosis not present

## 2018-08-27 DIAGNOSIS — M159 Polyosteoarthritis, unspecified: Secondary | ICD-10-CM | POA: Diagnosis not present

## 2018-08-27 DIAGNOSIS — R2689 Other abnormalities of gait and mobility: Secondary | ICD-10-CM | POA: Diagnosis not present

## 2018-08-28 DIAGNOSIS — Z9181 History of falling: Secondary | ICD-10-CM | POA: Diagnosis not present

## 2018-08-28 DIAGNOSIS — R2689 Other abnormalities of gait and mobility: Secondary | ICD-10-CM | POA: Diagnosis not present

## 2018-08-28 DIAGNOSIS — M6281 Muscle weakness (generalized): Secondary | ICD-10-CM | POA: Diagnosis not present

## 2018-08-28 DIAGNOSIS — N390 Urinary tract infection, site not specified: Secondary | ICD-10-CM | POA: Diagnosis not present

## 2018-08-28 DIAGNOSIS — F0391 Unspecified dementia with behavioral disturbance: Secondary | ICD-10-CM | POA: Diagnosis not present

## 2018-08-28 DIAGNOSIS — M159 Polyosteoarthritis, unspecified: Secondary | ICD-10-CM | POA: Diagnosis not present

## 2018-08-29 DIAGNOSIS — Z9181 History of falling: Secondary | ICD-10-CM | POA: Diagnosis not present

## 2018-08-29 DIAGNOSIS — F0391 Unspecified dementia with behavioral disturbance: Secondary | ICD-10-CM | POA: Diagnosis not present

## 2018-08-29 DIAGNOSIS — M159 Polyosteoarthritis, unspecified: Secondary | ICD-10-CM | POA: Diagnosis not present

## 2018-08-29 DIAGNOSIS — M6281 Muscle weakness (generalized): Secondary | ICD-10-CM | POA: Diagnosis not present

## 2018-08-29 DIAGNOSIS — N390 Urinary tract infection, site not specified: Secondary | ICD-10-CM | POA: Diagnosis not present

## 2018-08-29 DIAGNOSIS — R2689 Other abnormalities of gait and mobility: Secondary | ICD-10-CM | POA: Diagnosis not present

## 2018-08-30 DIAGNOSIS — Z9181 History of falling: Secondary | ICD-10-CM | POA: Diagnosis not present

## 2018-08-30 DIAGNOSIS — N390 Urinary tract infection, site not specified: Secondary | ICD-10-CM | POA: Diagnosis not present

## 2018-08-30 DIAGNOSIS — M6281 Muscle weakness (generalized): Secondary | ICD-10-CM | POA: Diagnosis not present

## 2018-08-30 DIAGNOSIS — F0391 Unspecified dementia with behavioral disturbance: Secondary | ICD-10-CM | POA: Diagnosis not present

## 2018-08-30 DIAGNOSIS — R2689 Other abnormalities of gait and mobility: Secondary | ICD-10-CM | POA: Diagnosis not present

## 2018-08-30 DIAGNOSIS — M159 Polyosteoarthritis, unspecified: Secondary | ICD-10-CM | POA: Diagnosis not present

## 2018-08-31 DIAGNOSIS — M159 Polyosteoarthritis, unspecified: Secondary | ICD-10-CM | POA: Diagnosis not present

## 2018-08-31 DIAGNOSIS — M6281 Muscle weakness (generalized): Secondary | ICD-10-CM | POA: Diagnosis not present

## 2018-08-31 DIAGNOSIS — F0391 Unspecified dementia with behavioral disturbance: Secondary | ICD-10-CM | POA: Diagnosis not present

## 2018-08-31 DIAGNOSIS — Z9181 History of falling: Secondary | ICD-10-CM | POA: Diagnosis not present

## 2018-08-31 DIAGNOSIS — N390 Urinary tract infection, site not specified: Secondary | ICD-10-CM | POA: Diagnosis not present

## 2018-08-31 DIAGNOSIS — R2689 Other abnormalities of gait and mobility: Secondary | ICD-10-CM | POA: Diagnosis not present

## 2018-09-01 DIAGNOSIS — F0391 Unspecified dementia with behavioral disturbance: Secondary | ICD-10-CM | POA: Diagnosis not present

## 2018-09-01 DIAGNOSIS — N390 Urinary tract infection, site not specified: Secondary | ICD-10-CM | POA: Diagnosis not present

## 2018-09-01 DIAGNOSIS — Z9181 History of falling: Secondary | ICD-10-CM | POA: Diagnosis not present

## 2018-09-01 DIAGNOSIS — M159 Polyosteoarthritis, unspecified: Secondary | ICD-10-CM | POA: Diagnosis not present

## 2018-09-01 DIAGNOSIS — M6281 Muscle weakness (generalized): Secondary | ICD-10-CM | POA: Diagnosis not present

## 2018-09-01 DIAGNOSIS — R2689 Other abnormalities of gait and mobility: Secondary | ICD-10-CM | POA: Diagnosis not present

## 2018-09-02 DIAGNOSIS — N390 Urinary tract infection, site not specified: Secondary | ICD-10-CM | POA: Diagnosis not present

## 2018-09-02 DIAGNOSIS — R2689 Other abnormalities of gait and mobility: Secondary | ICD-10-CM | POA: Diagnosis not present

## 2018-09-02 DIAGNOSIS — M6281 Muscle weakness (generalized): Secondary | ICD-10-CM | POA: Diagnosis not present

## 2018-09-02 DIAGNOSIS — M159 Polyosteoarthritis, unspecified: Secondary | ICD-10-CM | POA: Diagnosis not present

## 2018-09-02 DIAGNOSIS — Z9181 History of falling: Secondary | ICD-10-CM | POA: Diagnosis not present

## 2018-09-02 DIAGNOSIS — F0391 Unspecified dementia with behavioral disturbance: Secondary | ICD-10-CM | POA: Diagnosis not present

## 2018-09-03 DIAGNOSIS — F0391 Unspecified dementia with behavioral disturbance: Secondary | ICD-10-CM | POA: Diagnosis not present

## 2018-09-03 DIAGNOSIS — R2689 Other abnormalities of gait and mobility: Secondary | ICD-10-CM | POA: Diagnosis not present

## 2018-09-03 DIAGNOSIS — M159 Polyosteoarthritis, unspecified: Secondary | ICD-10-CM | POA: Diagnosis not present

## 2018-09-03 DIAGNOSIS — Z9181 History of falling: Secondary | ICD-10-CM | POA: Diagnosis not present

## 2018-09-03 DIAGNOSIS — N390 Urinary tract infection, site not specified: Secondary | ICD-10-CM | POA: Diagnosis not present

## 2018-09-03 DIAGNOSIS — M6281 Muscle weakness (generalized): Secondary | ICD-10-CM | POA: Diagnosis not present

## 2018-09-24 ENCOUNTER — Encounter: Payer: Self-pay | Admitting: Internal Medicine

## 2018-11-01 ENCOUNTER — Encounter (HOSPITAL_COMMUNITY): Payer: Self-pay | Admitting: Emergency Medicine

## 2018-11-01 ENCOUNTER — Emergency Department (HOSPITAL_COMMUNITY): Payer: Medicare Other

## 2018-11-01 ENCOUNTER — Other Ambulatory Visit: Payer: Self-pay

## 2018-11-01 ENCOUNTER — Inpatient Hospital Stay (HOSPITAL_COMMUNITY)
Admission: EM | Admit: 2018-11-01 | Discharge: 2018-11-07 | DRG: 092 | Disposition: A | Discharge status: Skilled Nursing Facility | Payer: Medicare Other | Attending: Pulmonary Disease | Admitting: Pulmonary Disease

## 2018-11-01 DIAGNOSIS — R400 Somnolence: Secondary | ICD-10-CM

## 2018-11-01 DIAGNOSIS — I252 Old myocardial infarction: Secondary | ICD-10-CM

## 2018-11-01 DIAGNOSIS — G40909 Epilepsy, unspecified, not intractable, without status epilepticus: Secondary | ICD-10-CM

## 2018-11-01 DIAGNOSIS — Z885 Allergy status to narcotic agent status: Secondary | ICD-10-CM

## 2018-11-01 DIAGNOSIS — K219 Gastro-esophageal reflux disease without esophagitis: Secondary | ICD-10-CM | POA: Diagnosis present

## 2018-11-01 DIAGNOSIS — G9340 Encephalopathy, unspecified: Secondary | ICD-10-CM | POA: Diagnosis present

## 2018-11-01 DIAGNOSIS — Z791 Long term (current) use of non-steroidal anti-inflammatories (NSAID): Secondary | ICD-10-CM

## 2018-11-01 DIAGNOSIS — Z888 Allergy status to other drugs, medicaments and biological substances status: Secondary | ICD-10-CM

## 2018-11-01 DIAGNOSIS — Z87891 Personal history of nicotine dependence: Secondary | ICD-10-CM

## 2018-11-01 DIAGNOSIS — J449 Chronic obstructive pulmonary disease, unspecified: Secondary | ICD-10-CM | POA: Diagnosis present

## 2018-11-01 DIAGNOSIS — R809 Proteinuria, unspecified: Secondary | ICD-10-CM | POA: Diagnosis present

## 2018-11-01 DIAGNOSIS — E119 Type 2 diabetes mellitus without complications: Secondary | ICD-10-CM

## 2018-11-01 DIAGNOSIS — G92 Toxic encephalopathy: Secondary | ICD-10-CM | POA: Diagnosis not present

## 2018-11-01 DIAGNOSIS — N183 Chronic kidney disease, stage 3 unspecified: Secondary | ICD-10-CM | POA: Diagnosis present

## 2018-11-01 DIAGNOSIS — Z951 Presence of aortocoronary bypass graft: Secondary | ICD-10-CM

## 2018-11-01 DIAGNOSIS — R4182 Altered mental status, unspecified: Secondary | ICD-10-CM | POA: Diagnosis present

## 2018-11-01 DIAGNOSIS — Z8249 Family history of ischemic heart disease and other diseases of the circulatory system: Secondary | ICD-10-CM

## 2018-11-01 DIAGNOSIS — F0390 Unspecified dementia without behavioral disturbance: Secondary | ICD-10-CM | POA: Diagnosis present

## 2018-11-01 DIAGNOSIS — E1122 Type 2 diabetes mellitus with diabetic chronic kidney disease: Secondary | ICD-10-CM | POA: Diagnosis present

## 2018-11-01 DIAGNOSIS — Z950 Presence of cardiac pacemaker: Secondary | ICD-10-CM

## 2018-11-01 DIAGNOSIS — Z8673 Personal history of transient ischemic attack (TIA), and cerebral infarction without residual deficits: Secondary | ICD-10-CM

## 2018-11-01 DIAGNOSIS — M545 Low back pain: Secondary | ICD-10-CM | POA: Diagnosis present

## 2018-11-01 DIAGNOSIS — Z881 Allergy status to other antibiotic agents status: Secondary | ICD-10-CM

## 2018-11-01 DIAGNOSIS — Z7984 Long term (current) use of oral hypoglycemic drugs: Secondary | ICD-10-CM

## 2018-11-01 DIAGNOSIS — I5032 Chronic diastolic (congestive) heart failure: Secondary | ICD-10-CM | POA: Diagnosis present

## 2018-11-01 DIAGNOSIS — Z96643 Presence of artificial hip joint, bilateral: Secondary | ICD-10-CM | POA: Diagnosis present

## 2018-11-01 DIAGNOSIS — Z86718 Personal history of other venous thrombosis and embolism: Secondary | ICD-10-CM

## 2018-11-01 DIAGNOSIS — Z7982 Long term (current) use of aspirin: Secondary | ICD-10-CM

## 2018-11-01 DIAGNOSIS — I4891 Unspecified atrial fibrillation: Secondary | ICD-10-CM | POA: Diagnosis present

## 2018-11-01 DIAGNOSIS — I130 Hypertensive heart and chronic kidney disease with heart failure and stage 1 through stage 4 chronic kidney disease, or unspecified chronic kidney disease: Secondary | ICD-10-CM | POA: Diagnosis present

## 2018-11-01 DIAGNOSIS — Z91010 Allergy to peanuts: Secondary | ICD-10-CM

## 2018-11-01 DIAGNOSIS — I482 Chronic atrial fibrillation, unspecified: Secondary | ICD-10-CM | POA: Diagnosis present

## 2018-11-01 DIAGNOSIS — Z79899 Other long term (current) drug therapy: Secondary | ICD-10-CM

## 2018-11-01 DIAGNOSIS — G8929 Other chronic pain: Secondary | ICD-10-CM | POA: Diagnosis present

## 2018-11-01 DIAGNOSIS — Z7901 Long term (current) use of anticoagulants: Secondary | ICD-10-CM

## 2018-11-01 HISTORY — DX: Unspecified dementia, unspecified severity, without behavioral disturbance, psychotic disturbance, mood disturbance, and anxiety: F03.90

## 2018-11-01 HISTORY — DX: Unspecified atrial fibrillation: I48.91

## 2018-11-01 LAB — URINALYSIS, ROUTINE W REFLEX MICROSCOPIC
BACTERIA UA: NONE SEEN
Bilirubin Urine: NEGATIVE
Glucose, UA: NEGATIVE mg/dL
Hgb urine dipstick: NEGATIVE
Ketones, ur: NEGATIVE mg/dL
Leukocytes, UA: NEGATIVE
Nitrite: NEGATIVE
PH: 5 (ref 5.0–8.0)
Protein, ur: 30 mg/dL — AB
Specific Gravity, Urine: 1.016 (ref 1.005–1.030)

## 2018-11-01 LAB — CBC WITH DIFFERENTIAL/PLATELET
Abs Immature Granulocytes: 0.03 10*3/uL (ref 0.00–0.07)
BASOS ABS: 0 10*3/uL (ref 0.0–0.1)
BASOS PCT: 1 %
Eosinophils Absolute: 0.2 10*3/uL (ref 0.0–0.5)
Eosinophils Relative: 3 %
HCT: 39.8 % (ref 36.0–46.0)
HEMOGLOBIN: 12.1 g/dL (ref 12.0–15.0)
Immature Granulocytes: 0 %
LYMPHS ABS: 1.6 10*3/uL (ref 0.7–4.0)
Lymphocytes Relative: 24 %
MCH: 27.5 pg (ref 26.0–34.0)
MCHC: 30.4 g/dL (ref 30.0–36.0)
MCV: 90.5 fL (ref 80.0–100.0)
MONOS PCT: 8 %
Monocytes Absolute: 0.5 10*3/uL (ref 0.1–1.0)
NEUTROS ABS: 4.3 10*3/uL (ref 1.7–7.7)
NEUTROS PCT: 64 %
NRBC: 0 % (ref 0.0–0.2)
Platelets: 169 10*3/uL (ref 150–400)
RBC: 4.4 MIL/uL (ref 3.87–5.11)
RDW: 15.3 % (ref 11.5–15.5)
WBC: 6.8 10*3/uL (ref 4.0–10.5)

## 2018-11-01 LAB — COMPREHENSIVE METABOLIC PANEL
ALT: 10 U/L (ref 0–44)
AST: 16 U/L (ref 15–41)
Albumin: 3.5 g/dL (ref 3.5–5.0)
Alkaline Phosphatase: 62 U/L (ref 38–126)
Anion gap: 7 (ref 5–15)
BUN: 30 mg/dL — AB (ref 8–23)
CHLORIDE: 110 mmol/L (ref 98–111)
CO2: 22 mmol/L (ref 22–32)
CREATININE: 1.17 mg/dL — AB (ref 0.44–1.00)
Calcium: 9.7 mg/dL (ref 8.9–10.3)
GFR calc Af Amer: 51 mL/min — ABNORMAL LOW (ref >60)
GFR calc non Af Amer: 44 mL/min — ABNORMAL LOW (ref >60)
Glucose, Bld: 100 mg/dL — ABNORMAL HIGH (ref 70–99)
POTASSIUM: 3.8 mmol/L (ref 3.5–5.1)
SODIUM: 139 mmol/L (ref 135–145)
Total Bilirubin: 0.7 mg/dL (ref 0.3–1.2)
Total Protein: 7.3 g/dL (ref 6.5–8.1)

## 2018-11-01 LAB — PROTIME-INR
INR: 1.66
PROTHROMBIN TIME: 19.4 s — AB (ref 11.4–15.2)

## 2018-11-01 LAB — TROPONIN I: Troponin I: <0.03 ng/mL (ref <0.03)

## 2018-11-01 LAB — CBG MONITORING, ED: GLUCOSE-CAPILLARY: 107 mg/dL — AB (ref 70–99)

## 2018-11-01 LAB — LACTIC ACID, PLASMA: LACTIC ACID, VENOUS: 0.7 mmol/L (ref 0.5–1.9)

## 2018-11-01 MED ORDER — SODIUM CHLORIDE 0.9 % IV SOLN
INTRAVENOUS | Status: DC
  Administered 2018-11-01: 22:00:00 via INTRAVENOUS

## 2018-11-01 MED ORDER — SODIUM CHLORIDE 0.9 % IV BOLUS
500.0000 mL | Freq: Once | INTRAVENOUS | Status: AC
  Administered 2018-11-01: 500 mL via INTRAVENOUS

## 2018-11-01 NOTE — ED Notes (Addendum)
Pt taken to CT. Family at bedside and updated.

## 2018-11-01 NOTE — ED Triage Notes (Signed)
[facility] [place] [facility] pt has had decreased LOC x 2 days. Pt arrived lethargic. Opens eyes with pain only. abd distended but soft. Brought in bc family came in and wanted her to be checked.

## 2018-11-01 NOTE — ED Provider Notes (Signed)
[facility] EMERGENCY DEPARTMENT Provider Note   CSN: [id number] Arrival date & time: 11/01/18  2041     History   Chief Complaint Chief Complaint  Patient presents with  . Altered Mental Status    HPI [name] is a 77 y.o. female.  The history is provided by the EMS personnel, the nursing home and a relative. The history is limited by the condition of the patient (Hx dementia).  Altered Mental Status    Pt was seen at 2050.  Per EMS and NH report: Pt has had decreased LOC for the past several days. Pt will open her eyes and talk to painful stimuli and when she is moved from EMS cot to ED stretcher. Family went to visit pt today at [facility] and requested pt be transported to ED for evaluation. Pt herself has hx of dementia.    Past Medical History:  Diagnosis Date  . Anemia   . Anginal pain (HCC)    "all the time, but not heart related"  . Arthritis    "legs and back" (09/30/2013)  . Asthma    years ago  . Atrial fibrillation (HCC)   . 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 Blurred vision, bilateral    sometimes double vision  . CAD (coronary artery disease)    multivessel, CABG 6/10.  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  Myoview (11/20/11) showed no ischemia & EF 62%  . Chronic lower back pain   . Confusion   . COPD (chronic obstructive pulmonary disease) (HCC)    severe lung disease by PFTs 6/12  . DDD (degenerative disc disease)   . Deep vein thrombosis, upper right extremity (HCC)    post-PPM  . Dehiscence of closure of sternum or sternotomy 07/2009   sternal infection; required flap closure  . Dementia (HCC)   . Diabetes mellitus, type II (HCC)   . Gastroesophageal reflux disease    not much  . H/O hiatal hernia   . History of kidney stones   . Hyperlipidemia   . Hypertension   . Migraines    "years ago" (09/30/2013)  . Obesity   . Orthopnea    sometimes  . Pacemaker   . PUD (peptic ulcer disease)   . Seizures (HCC)      "long time ago; don't remember what they were related to" (09/30/2013)none recent  . Sleep apnea    denies CPAP use on 09/30/2013  . Stroke [facility])    years ago, "residual on left back"  . Urge incontinence of urine     Patient Active Problem List   Diagnosis Date Noted  . Urinary tract infection 10/18/2017  . HCAP (healthcare-associated pneumonia) 09/26/2017  . Lobar pneumonia (HCC) 09/19/2017  . Acute metabolic encephalopathy 09/19/2017  . Concussion with no loss of consciousness 09/28/2016  . AKI (acute kidney injury) (HCC) 09/25/2016  . Acute kidney injury (nontraumatic) (HCC) 09/25/2016  . Fall 09/25/2016  . Acute kidney injury (HCC) 09/25/2016  . Chest pain 08/29/2016  . Atrial fibrillation (HCC) 02/06/2015  . Encephalopathy, metabolic 10/12/2014  . Intractable nausea and vomiting 10/09/2014  . Epigastric abdominal pain 10/09/2014  . Expected blood loss anemia 12/04/2013  . S/P left THA, AA 12/03/2013  . Protein-calorie malnutrition, severe (HCC) 10/02/2013  . Precordial pain 09/30/2013  . Failure to thrive 07/26/2013  . Anemia of chronic disease 07/13/2013  . Altered mental state 07/13/2013  . NSTEMI (non-ST elevated myocardial infarction) (HCC) 07/01/2013  .  Acute delirium 06/29/2013  . Fecal impaction (HCC) 06/29/2013  . Total knee replacement status 05/22/2013  . H/O total hip arthroplasty 05/22/2013  . Spinal stenosis, lumbar region, with neurogenic claudication 05/22/2013  . Osteoarthritis of left hip 05/22/2013  . Noninfectious gastroenteritis and colitis 03/05/2013  . Dehydration 03/05/2013  . Family history of colon cancer 02/15/2013  . PUD (peptic ulcer disease) 02/15/2013  . Diabetes mellitus, type II (HCC)   . Hypertension   . Arteriosclerotic cardiovascular disease (ASCVD)   . Gastroesophageal reflux disease   . Hyperlipidemia   . Deep vein thrombosis, upper right extremity (HCC)   . Obesity   . AV block   . COPD (chronic obstructive pulmonary  disease) (HCC)   . Sleep apnea   . Chronic kidney disease (CKD), stage III (moderate) (HCC)   . Seizure disorder (HCC)   . Anemia, normocytic normochromic 12/15/2011  . Chronic diastolic heart failure (HCC) 11/18/2011  . PACEMAKER, PERMANENT 05/13/2009  . 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 GASTRIC ULCER 05/12/2009  . Gastroparesis 05/12/2009  . DIVERTICULAR DISEASE 05/12/2009    Past Surgical History:  Procedure Laterality Date  . ABDOMINAL HYSTERECTOMY     partial  . APPENDECTOMY    . BACK SURGERY     x3 total  . CESAREAN SECTION  ~1980  . CHOLECYSTECTOMY    . COLONOSCOPY  04/27/2005   hemorrhoids, diverticula  . COLONOSCOPY WITH ESOPHAGOGASTRODUODENOSCOPY (EGD) N/A 02/20/2013   RMR: pancolonic diverticulosis and internal hemorrhoids. EGD with small hiatal hernia, healed gastric ulcer  . CORONARY ARTERY BYPASS GRAFT  05/1999   - LIMA to LAD, SVG to OM, SVG to PDA  . ESOPHAGOGASTRODUODENOSCOPY     with [facility] dilation,  biopsy, and disruption Schatzki's ring  . ESOPHAGOGASTRODUODENOSCOPY  09/10/2012   RMR: Noncritical Schatzki's ring. Diffuse gastric erosions and an  area of partially healing ulceration-status post biopsy/ Small hiatal hernia. Bx reactive gastropathy.  . INSERT / REPLACE / REMOVE PACEMAKER  1993  . JOINT REPLACEMENT    . LUMBAR DISC SURGERY    . PACEMAKER PLACEMENT  1993   Status post DDD pacemaker implantation in 1993, with Medtronic Kappa generator change in 2003 for  second-degree AV block, most recent gen change by Fawn Kirk 04/14/11  . PORTACATH PLACEMENT Left 07/17/2013   Procedure: INSERTION PORT-A-CATH-left subclavian;  Surgeon: Fabio Bering, MD;  Location: [facility];  Service: General;  Laterality: Left;  . POSTERIOR LUMBAR FUSION    . RECONSTRUCTIVE REPAIR STERNAL     for infection s/p CABG 8/10  . REPLACEMENT TOTAL KNEE Bilateral   . THYROIDECTOMY    . TOTAL HIP ARTHROPLASTY Right   . TOTAL HIP ARTHROPLASTY Left 12/03/2013   Procedure: LEFT TOTAL HIP  ARTHROPLASTY ANTERIOR APPROACH;  Surgeon: Shelda Pal, MD;  Location: [facility];  Service: Orthopedics;  Laterality: Left;  . TUBAL LIGATION    . WRIST SURGERY Left    "tumor taken off" (09/30/2013)     OB History    Gravida  5   Para  5   Term  5   Preterm      AB      Living  4     SAB      TAB      Ectopic      Multiple      Live Births               Home Medications    Prior to Admission medications   Medication Sig  Start Date End Date Taking? Authorizing Provider  acetaminophen (TYLENOL) 325 MG tablet Take 325-650 mg by mouth every 6 (six) hours as needed for mild pain, moderate pain or headache.     [provider]  amLODipine (NORVASC) 10 MG tablet Take 1 tablet (10 mg total) daily by mouth. 10/23/17   Kari BaarsHawkins, Edward, MD  apixaban (ELIQUIS) 5 MG TABS tablet Take 5 mg by mouth 2 (two) times daily.     [provider]  aspirin EC 81 MG tablet Take 81 mg by mouth daily.    [provider]  atorvastatin (LIPITOR) 40 MG tablet Take 40 mg by mouth at bedtime.    [provider]  cephALEXin (KEFLEX) 250 MG capsule Take 1 capsule (250 mg total) every 8 (eight) hours by mouth. 10/23/17   Kari BaarsHawkins, Edward, MD  ferrous sulfate 325 (65 FE) MG tablet Take 325 mg by mouth daily with breakfast.    [provider]  furosemide (LASIX) 40 MG tablet Take 40 mg by mouth daily as needed for fluid.    [provider]  gabapentin (NEURONTIN) 100 MG capsule Take 200 mg by mouth at bedtime.    [provider]  ibuprofen (ADVIL,MOTRIN) 200 MG tablet Take 600 mg by mouth every 6 (six) hours as needed for headache, mild pain or moderate pain.    [provider]  levETIRAcetam (KEPPRA) 500 MG tablet Take 500 mg by mouth 2 (two) times daily.    [provider]  lubiprostone (AMITIZA) 24 MCG capsule Take 24 mcg by mouth 2 (two) times daily with a meal.    [provider]  Melatonin 3 MG TABS Take 3  mg by mouth at bedtime as needed (for sleep).    [provider]  metFORMIN (GLUCOPHAGE) 500 MG tablet Take 500 mg by mouth 2 (two) times daily with a meal.     [provider]  metoprolol tartrate (LOPRESSOR) 25 MG tablet Take 12.5 mg by mouth 2 (two) times daily.     [provider]  nitroGLYCERIN (NITROSTAT) 0.4 MG SL tablet Place 1 tablet (0.4 mg total) under the tongue every 5 (five) minutes as needed for chest pain. 05/20/14   Antoine PocheBranch, Jonathan F, MD  omega-3 acid ethyl esters (LOVAZA) 1 G capsule Take 2 g by mouth 2 (two) times daily.     [provider]  ondansetron (ZOFRAN-ODT) 4 MG disintegrating tablet Take 4 mg by mouth every 8 (eight) hours as needed for nausea or vomiting.    [provider]  pantoprazole (PROTONIX) 40 MG tablet Take 1 tablet (40 mg total) by mouth daily. 12/03/13   Gelene MinkBoone, Anna W, NP  potassium chloride (K-DUR,KLOR-CON) 10 MEQ tablet Take 10 mEq by mouth daily.    [provider]  senna-docusate (SENEXON-S) 8.6-50 MG tablet Take 1 tablet by mouth 2 (two) times daily.    [provider]  traZODone (DESYREL) 50 MG tablet Take 50 mg by mouth at bedtime.    [provider]    Family History Family History  Problem Relation Age of Onset  . Heart disease Mother        MI  . Colon cancer Father        age 79  . Diabetes Unknown   . Coronary artery disease Unknown        Female <55  . Cancer Unknown     Social History Social History   Tobacco Use  . Smoking status: Never Smoker  .  Smokeless tobacco: Former Neurosurgeon    Types: Snuff  . Tobacco comment: 09/30/2013 "stopped snuff a long time ago"  Substance Use Topics  . Alcohol use: No  . Drug use: No     Allergies   Codeine; Food; Levaquin [levofloxacin]; Oxycodone hcl; Peanut-containing drug products; and Reglan [metoclopramide]   Review of Systems Review of Systems  Unable to perform ROS: Dementia     Physical Exam Updated Vital  Signs BP (!) 161/107   Pulse 63   Temp 98.3 F (36.8 C) (Rectal)   Resp 15   SpO2 100%    Patient Vitals for the past 24 hrs:  BP Temp Temp src Pulse Resp SpO2  11/01/18 2329 (!) 74/56 - - (!) 58 16 -  11/01/18 2328 - - - (!) 59 15 -  11/01/18 2327 - - - (!) 59 (!) 32 -  11/01/18 2326 (!) 81/58 - - (!) 58 17 -  11/01/18 2325 (!) 82/45 - - (!) 58 20 -  11/01/18 2324 - - - (!) 59 14 -  11/01/18 2323 (!) 77/56 - - (!) 59 14 -  11/01/18 2300 (!) 95/57 - - (!) 59 (!) 33 96 %  11/01/18 2242 (!) 156/100 - - (!) 58 (!) 24 -  11/01/18 2200 (!) 138/113 - - 65 14 98 %  11/01/18 2145 - - - 63 15 -  11/01/18 2130 - - - 65 18 -  11/01/18 2115 - - - (!) 57 16 -  11/01/18 2100 (!) 161/107 - - 77 15 100 %  11/01/18 2052 (!) 152/109 98.3 F (36.8 C) Rectal 68 18 99 %     Physical Exam 2055: Physical examination:  Nursing notes reviewed; Vital signs and O2 SAT reviewed;  Constitutional: Well developed, Well nourished, In no acute distress; Head:  Normocephalic, atraumatic; Eyes: EOMI, PERRL, No scleral icterus; ENMT: Mouth and pharynx normal, Mucous membranes dry; Neck: Supple, Full range of motion, No lymphadenopathy; Cardiovascular: Irregular rate and rhythm, No gallop; Respiratory: Breath sounds clear & equal bilaterally, No wheezes. Normal respiratory effort/excursion; Chest: Nontender, Movement normal; Abdomen: Soft, Nontender, Nondistended, Normal bowel sounds; Genitourinary: No CVA tenderness; Extremities: Peripheral pulses normal, No tenderness, No edema, No calf edema or asymmetry.; Neuro: Laying eyes closed. Will briefly talk ("hey what are you doing?") and cry out when touched. No facial droop. Pt moves all extremities spontaneously on stretcher. Pt will not follow commands..; Skin: Color normal, Warm, Dry.   ED Treatments / Results  Labs (all labs ordered are listed, but only abnormal results are displayed)   EKG EKG Interpretation  Date/Time:  Thursday November 01 2018 20:50:50  EST Ventricular Rate:  60 PR Interval:    QRS Duration: 152 QT Interval:  473 QTC Calculation: 473 R Axis:   -80 Text Interpretation:  Atrial fibrillation with frequent ventricular-paced complexes LVH with IVCD, LAD and secondary repol abnrm Anterolateral infarct, age indeterminate When compared with ECG of 08/30/2016 No significant change was found Confirmed by Samuel Jester [id number]) on 11/01/2018 9:04:09 PM   Radiology   Procedures Procedures (including critical care time)  Medications Ordered in ED Medications  0.9 %  sodium chloride infusion ( Intravenous New Bag/Given 11/01/18 2138)     Initial Impression / Assessment and Plan / ED Course  I have reviewed the triage vital signs and the nursing notes.  Pertinent labs & imaging results that were available during my care of the patient were reviewed by me and considered in my medical decision  making (see chart for details).  MDM Reviewed: previous chart, nursing note and vitals Reviewed previous: labs and ECG Interpretation: labs, ECG, x-ray and CT scan    Results for orders placed or performed during the hospital encounter of 11/01/18  Comprehensive metabolic panel  Result Value Ref Range   Sodium 139 135 - 145 mmol/L   Potassium 3.8 3.5 - 5.1 mmol/L   Chloride 110 98 - 111 mmol/L   CO2 22 22 - 32 mmol/L   Glucose, Bld 100 (H) 70 - 99 mg/dL   BUN 30 (H) 8 - 23 mg/dL   Creatinine, Ser 1.61 (H) 0.44 - 1.00 mg/dL   Calcium 9.7 8.9 - 09.6 mg/dL   Total Protein 7.3 6.5 - 8.1 g/dL   Albumin 3.5 3.5 - 5.0 g/dL   AST 16 15 - 41 U/L   ALT 10 0 - 44 U/L   Alkaline Phosphatase 62 38 - 126 U/L   Total Bilirubin 0.7 0.3 - 1.2 mg/dL   GFR calc non Af Amer 44 (L) >60 mL/min   GFR calc Af Amer 51 (L) >60 mL/min   Anion gap 7 5 - 15  Troponin I - Once  Result Value Ref Range   Troponin I <0.03 <0.03 ng/mL  Lactic acid, plasma  Result Value Ref Range   Lactic Acid, Venous 0.7 0.5 - 1.9 mmol/L  CBC with Differential    Result Value Ref Range   WBC 6.8 4.0 - 10.5 K/uL   RBC 4.40 3.87 - 5.11 MIL/uL   Hemoglobin 12.1 12.0 - 15.0 g/dL   HCT 04.5 40.9 - 81.1 %   MCV 90.5 80.0 - 100.0 fL   MCH 27.5 26.0 - 34.0 pg   MCHC 30.4 30.0 - 36.0 g/dL   RDW 91.4 78.2 - 95.6 %   Platelets 169 150 - 400 K/uL   nRBC 0.0 0.0 - 0.2 %   Neutrophils Relative % 64 %   Neutro Abs 4.3 1.7 - 7.7 K/uL   Lymphocytes Relative 24 %   Lymphs Abs 1.6 0.7 - 4.0 K/uL   Monocytes Relative 8 %   Monocytes Absolute 0.5 0.1 - 1.0 K/uL   Eosinophils Relative 3 %   Eosinophils Absolute 0.2 0.0 - 0.5 K/uL   Basophils Relative 1 %   Basophils Absolute 0.0 0.0 - 0.1 K/uL   Immature Granulocytes 0 %   Abs Immature Granulocytes 0.03 0.00 - 0.07 K/uL  Urinalysis, Routine w reflex microscopic  Result Value Ref Range   Color, Urine YELLOW YELLOW   APPearance HAZY (A) CLEAR   Specific Gravity, Urine 1.016 1.005 - 1.030   pH 5.0 5.0 - 8.0   Glucose, UA NEGATIVE NEGATIVE mg/dL   Hgb urine dipstick NEGATIVE NEGATIVE   Bilirubin Urine NEGATIVE NEGATIVE   Ketones, ur NEGATIVE NEGATIVE mg/dL   Protein, ur 30 (A) NEGATIVE mg/dL   Nitrite NEGATIVE NEGATIVE   Leukocytes, UA NEGATIVE NEGATIVE   RBC / HPF 0-5 0 - 5 RBC/hpf   WBC, UA 0-5 0 - 5 WBC/hpf   Bacteria, UA NONE SEEN NONE SEEN   Mucus PRESENT    Hyaline Casts, UA PRESENT   CBG monitoring, ED  Result Value Ref Range   Glucose-Capillary 107 (H) 70 - 99 mg/dL   Dg Chest 1 View Result Date: 11/01/2018 CLINICAL DATA:  Altered mental status EXAM: CHEST  1 VIEW COMPARISON:  10/18/2017 FINDINGS: Left-sided central venous port tip over the SVC. Right-sided pacing device as before. Cardiomegaly without acute  opacity or pleural effusion. No pneumothorax. IMPRESSION: No active disease.  Cardiomegaly Electronically Signed   By: Jasmine Pang M.D.   On: 11/01/2018 21:30   Ct Head Wo Contrast Result Date: 11/01/2018 CLINICAL DATA:  Initial evaluation for acute lethargy, confusion, decreased  level of consciousness. EXAM: CT HEAD WITHOUT CONTRAST TECHNIQUE: Contiguous axial images were obtained from the base of the skull through the vertex without intravenous contrast. COMPARISON:  Prior CT from 09/26/2017. FINDINGS: Brain: Generalized age-related cerebral atrophy with moderate to advanced chronic microvascular ischemic disease, mildly progressed relative to 2018. No acute intracranial hemorrhage. No acute large vessel territory infarct. No mass lesion, midline shift or mass effect. No hydrocephalus. No extra-axial fluid collection. Vascular: No hyperdense vessel. Calcified atherosclerosis at the skull base. Skull: Scalp soft tissues and calvarium demonstrate no acute abnormality. Sinuses/Orbits: Globes and orbital soft tissues within normal limits. Paranasal sinuses and mastoid air cells are clear. Other: None. IMPRESSION: 1. No acute intracranial abnormality. 2. Generalized age-related cerebral atrophy with moderate to advanced chronic microvascular ischemic changes, mildly progressed relative to 2018. Electronically Signed   By: Rise Mu M.D.   On: 11/01/2018 22:07     2345:   Pt continues to lay with eyes closed, resps easy, moans and talks with painful stimuli, then becomes somnolent again. Workup without definitive cause. No fevers. BP now labile; IVF bolus and gtt continue. Dx and testing d/w pt's family.  Questions answered.  Verb understanding, agreeable to admit.  T/C returned from [facility]. Antionette Char, case discussed, including:  HPI, pertinent PM/SHx, VS/PE, dx testing, ED course and treatment:  Agreeable to admit.     Final Clinical Impressions(s) / ED Diagnoses   Final diagnoses:  None    ED Discharge Orders    None       Samuel Jester, DO 11/05/18 1610

## 2018-11-01 NOTE — ED Notes (Addendum)
Pt has been difficult stick.phlebotomist x 2 to attempt labs but unsuccessful. Advised by phlebotomist that unsuccessful. Will attempt port a cath insertion. No changes. Nad. EDP aware

## 2018-11-01 NOTE — ED Notes (Signed)
Pt returned from ct

## 2018-11-02 ENCOUNTER — Encounter (HOSPITAL_COMMUNITY): Payer: Self-pay | Admitting: Family Medicine

## 2018-11-02 ENCOUNTER — Other Ambulatory Visit: Payer: Self-pay

## 2018-11-02 ENCOUNTER — Inpatient Hospital Stay (HOSPITAL_COMMUNITY)
Admit: 2018-11-02 | Discharge: 2018-11-02 | Disposition: A | Discharge status: Home / Self Care | Payer: Medicare Other | Attending: Pulmonary Disease | Admitting: Pulmonary Disease

## 2018-11-02 DIAGNOSIS — R400 Somnolence: Secondary | ICD-10-CM | POA: Diagnosis present

## 2018-11-02 DIAGNOSIS — K219 Gastro-esophageal reflux disease without esophagitis: Secondary | ICD-10-CM | POA: Diagnosis present

## 2018-11-02 DIAGNOSIS — I130 Hypertensive heart and chronic kidney disease with heart failure and stage 1 through stage 4 chronic kidney disease, or unspecified chronic kidney disease: Secondary | ICD-10-CM | POA: Diagnosis present

## 2018-11-02 DIAGNOSIS — G9340 Encephalopathy, unspecified: Secondary | ICD-10-CM | POA: Diagnosis not present

## 2018-11-02 DIAGNOSIS — R809 Proteinuria, unspecified: Secondary | ICD-10-CM | POA: Diagnosis present

## 2018-11-02 DIAGNOSIS — Z951 Presence of aortocoronary bypass graft: Secondary | ICD-10-CM | POA: Diagnosis not present

## 2018-11-02 DIAGNOSIS — G92 Toxic encephalopathy: Secondary | ICD-10-CM | POA: Diagnosis present

## 2018-11-02 DIAGNOSIS — G40909 Epilepsy, unspecified, not intractable, without status epilepticus: Secondary | ICD-10-CM | POA: Diagnosis present

## 2018-11-02 DIAGNOSIS — E1122 Type 2 diabetes mellitus with diabetic chronic kidney disease: Secondary | ICD-10-CM

## 2018-11-02 DIAGNOSIS — I252 Old myocardial infarction: Secondary | ICD-10-CM | POA: Diagnosis not present

## 2018-11-02 DIAGNOSIS — Z7984 Long term (current) use of oral hypoglycemic drugs: Secondary | ICD-10-CM | POA: Diagnosis not present

## 2018-11-02 DIAGNOSIS — I5032 Chronic diastolic (congestive) heart failure: Secondary | ICD-10-CM

## 2018-11-02 DIAGNOSIS — Z91010 Allergy to peanuts: Secondary | ICD-10-CM | POA: Diagnosis not present

## 2018-11-02 DIAGNOSIS — M545 Low back pain: Secondary | ICD-10-CM | POA: Diagnosis present

## 2018-11-02 DIAGNOSIS — Z7982 Long term (current) use of aspirin: Secondary | ICD-10-CM | POA: Diagnosis not present

## 2018-11-02 DIAGNOSIS — I4891 Unspecified atrial fibrillation: Secondary | ICD-10-CM | POA: Diagnosis not present

## 2018-11-02 DIAGNOSIS — J449 Chronic obstructive pulmonary disease, unspecified: Secondary | ICD-10-CM | POA: Diagnosis present

## 2018-11-02 DIAGNOSIS — Z8249 Family history of ischemic heart disease and other diseases of the circulatory system: Secondary | ICD-10-CM | POA: Diagnosis not present

## 2018-11-02 DIAGNOSIS — Z7901 Long term (current) use of anticoagulants: Secondary | ICD-10-CM | POA: Diagnosis not present

## 2018-11-02 DIAGNOSIS — Z950 Presence of cardiac pacemaker: Secondary | ICD-10-CM | POA: Diagnosis not present

## 2018-11-02 DIAGNOSIS — G8929 Other chronic pain: Secondary | ICD-10-CM | POA: Diagnosis present

## 2018-11-02 DIAGNOSIS — R4182 Altered mental status, unspecified: Secondary | ICD-10-CM | POA: Diagnosis present

## 2018-11-02 DIAGNOSIS — I482 Chronic atrial fibrillation, unspecified: Secondary | ICD-10-CM | POA: Diagnosis present

## 2018-11-02 DIAGNOSIS — F0390 Unspecified dementia without behavioral disturbance: Secondary | ICD-10-CM | POA: Diagnosis present

## 2018-11-02 DIAGNOSIS — Z888 Allergy status to other drugs, medicaments and biological substances status: Secondary | ICD-10-CM | POA: Diagnosis not present

## 2018-11-02 DIAGNOSIS — Z96643 Presence of artificial hip joint, bilateral: Secondary | ICD-10-CM | POA: Diagnosis present

## 2018-11-02 DIAGNOSIS — N183 Chronic kidney disease, stage 3 (moderate): Secondary | ICD-10-CM | POA: Diagnosis present

## 2018-11-02 DIAGNOSIS — Z8673 Personal history of transient ischemic attack (TIA), and cerebral infarction without residual deficits: Secondary | ICD-10-CM | POA: Diagnosis not present

## 2018-11-02 LAB — MRSA PCR SCREENING: MRSA by PCR: POSITIVE — AB

## 2018-11-02 LAB — CBC WITH DIFFERENTIAL/PLATELET
Abs Immature Granulocytes: 0.02 10*3/uL (ref 0.00–0.07)
Basophils Absolute: 0 10*3/uL (ref 0.0–0.1)
Basophils Relative: 1 %
Eosinophils Absolute: 0.2 10*3/uL (ref 0.0–0.5)
Eosinophils Relative: 3 %
HCT: 47.2 % — ABNORMAL HIGH (ref 36.0–46.0)
Hemoglobin: 14.3 g/dL (ref 12.0–15.0)
Immature Granulocytes: 0 %
Lymphocytes Relative: 21 %
Lymphs Abs: 1.3 10*3/uL (ref 0.7–4.0)
MCH: 27.4 pg (ref 26.0–34.0)
MCHC: 30.3 g/dL (ref 30.0–36.0)
MCV: 90.4 fL (ref 80.0–100.0)
Monocytes Absolute: 0.4 10*3/uL (ref 0.1–1.0)
Monocytes Relative: 7 %
Neutro Abs: 4.2 10*3/uL (ref 1.7–7.7)
Neutrophils Relative %: 68 %
Platelets: 164 10*3/uL (ref 150–400)
RBC: 5.22 MIL/uL — AB (ref 3.87–5.11)
RDW: 15.5 % (ref 11.5–15.5)
WBC: 6.2 10*3/uL (ref 4.0–10.5)
nRBC: 0 % (ref 0.0–0.2)

## 2018-11-02 LAB — BASIC METABOLIC PANEL
ANION GAP: 10 (ref 5–15)
BUN: 26 mg/dL — ABNORMAL HIGH (ref 8–23)
CO2: 22 mmol/L (ref 22–32)
Calcium: 10.2 mg/dL (ref 8.9–10.3)
Chloride: 109 mmol/L (ref 98–111)
Creatinine, Ser: 1.21 mg/dL — ABNORMAL HIGH (ref 0.44–1.00)
GFR calc non Af Amer: 43 mL/min — ABNORMAL LOW (ref >60)
GFR, EST AFRICAN AMERICAN: 49 mL/min — AB (ref >60)
Glucose, Bld: 100 mg/dL — ABNORMAL HIGH (ref 70–99)
Potassium: 4.5 mmol/L (ref 3.5–5.1)
Sodium: 141 mmol/L (ref 135–145)

## 2018-11-02 LAB — AMMONIA: Ammonia: 21 umol/L (ref 9–35)

## 2018-11-02 LAB — BLOOD GAS, ARTERIAL
Acid-base deficit: 2.2 mmol/L — ABNORMAL HIGH (ref 0.0–2.0)
Bicarbonate: 22 mmol/L (ref 20.0–28.0)
Drawn by: 419771
FIO2: 21
O2 Saturation: 93.5 %
PATIENT TEMPERATURE: 37
PO2 ART: 75.6 mmHg — AB (ref 83.0–108.0)
pCO2 arterial: 47.3 mmHg (ref 32.0–48.0)
pH, Arterial: 7.31 — ABNORMAL LOW (ref 7.350–7.450)

## 2018-11-02 LAB — GLUCOSE, CAPILLARY
GLUCOSE-CAPILLARY: 96 mg/dL (ref 70–99)
Glucose-Capillary: 91 mg/dL (ref 70–99)
Glucose-Capillary: 83 mg/dL (ref 70–99)
Glucose-Capillary: 93 mg/dL (ref 70–99)

## 2018-11-02 LAB — CK: Total CK: 32 U/L — ABNORMAL LOW (ref 38–234)

## 2018-11-02 LAB — TSH: TSH: 1.404 u[IU]/mL (ref 0.350–4.500)

## 2018-11-02 LAB — VITAMIN B12: Vitamin B-12: 414 pg/mL (ref 180–914)

## 2018-11-02 LAB — CBG MONITORING, ED: Glucose-Capillary: 94 mg/dL (ref 70–99)

## 2018-11-02 MED ORDER — MELATONIN 3 MG PO TABS
3.0000 mg | ORAL_TABLET | Freq: Every evening | ORAL | Status: DC | PRN
  Filled 2018-11-02: qty 1

## 2018-11-02 MED ORDER — ACETAMINOPHEN 650 MG RE SUPP: 650.0000 mg | Freq: Four times a day (QID) | RECTAL | Status: DC | PRN

## 2018-11-02 MED ORDER — SODIUM CHLORIDE 0.9 % IV SOLN
INTRAVENOUS | Status: AC
  Administered 2018-11-02: 05:00:00 via INTRAVENOUS

## 2018-11-02 MED ORDER — INSULIN ASPART 100 UNIT/ML ~~LOC~~ SOLN: 0.0000 [IU] | Freq: Every day | SUBCUTANEOUS | Status: DC

## 2018-11-02 MED ORDER — SODIUM CHLORIDE 0.9 % IV SOLN
INTRAVENOUS | Status: AC
  Administered 2018-11-02: 18:00:00 via INTRAVENOUS

## 2018-11-02 MED ORDER — INSULIN ASPART 100 UNIT/ML ~~LOC~~ SOLN
0.0000 [IU] | Freq: Three times a day (TID) | SUBCUTANEOUS | Status: DC
  Administered 2018-11-03 (×2): 1 [IU] via SUBCUTANEOUS

## 2018-11-02 MED ORDER — LEVETIRACETAM 500 MG PO TABS: 500.0000 mg | ORAL_TABLET | Freq: Two times a day (BID) | ORAL | Status: DC

## 2018-11-02 MED ORDER — GABAPENTIN 100 MG PO CAPS
200.0000 mg | ORAL_CAPSULE | Freq: Every day | ORAL | Status: DC
  Administered 2018-11-04 – 2018-11-06 (×3): 200 mg via ORAL
  Filled 2018-11-02 (×2): qty 2

## 2018-11-02 MED ORDER — LUBIPROSTONE 24 MCG PO CAPS
24.0000 ug | ORAL_CAPSULE | Freq: Two times a day (BID) | ORAL | Status: DC
  Administered 2018-11-05 – 2018-11-07 (×5): 24 ug via ORAL
  Filled 2018-11-02 (×5): qty 1

## 2018-11-02 MED ORDER — POLYETHYLENE GLYCOL 3350 17 G PO PACK: 17.0000 g | PACK | Freq: Every day | ORAL | Status: DC | PRN

## 2018-11-02 MED ORDER — LEVETIRACETAM IN NACL 500 MG/100ML IV SOLN
500.0000 mg | Freq: Two times a day (BID) | INTRAVENOUS | Status: DC
  Administered 2018-11-02 – 2018-11-06 (×10): 500 mg via INTRAVENOUS
  Filled 2018-11-02 (×10): qty 100

## 2018-11-02 MED ORDER — PANTOPRAZOLE SODIUM 40 MG PO TBEC
40.0000 mg | DELAYED_RELEASE_TABLET | Freq: Every day | ORAL | Status: DC
  Administered 2018-11-05 – 2018-11-07 (×3): 40 mg via ORAL
  Filled 2018-11-02 (×4): qty 1

## 2018-11-02 MED ORDER — OMEGA-3-ACID ETHYL ESTERS 1 G PO CAPS
2.0000 g | ORAL_CAPSULE | Freq: Two times a day (BID) | ORAL | Status: DC
  Administered 2018-11-05 – 2018-11-07 (×4): 2 g via ORAL
  Filled 2018-11-02 (×5): qty 2

## 2018-11-02 MED ORDER — ENOXAPARIN SODIUM 80 MG/0.8ML ~~LOC~~ SOLN
1.0000 mg/kg | Freq: Two times a day (BID) | SUBCUTANEOUS | Status: DC
  Administered 2018-11-02 – 2018-11-07 (×9): 80 mg via SUBCUTANEOUS
  Filled 2018-11-02 (×11): qty 0.8

## 2018-11-02 MED ORDER — ACETAMINOPHEN 325 MG PO TABS: 650.0000 mg | ORAL_TABLET | Freq: Four times a day (QID) | ORAL | Status: DC | PRN

## 2018-11-02 MED ORDER — ONDANSETRON HCL 4 MG PO TABS: 4.0000 mg | ORAL_TABLET | Freq: Four times a day (QID) | ORAL | Status: DC | PRN

## 2018-11-02 MED ORDER — ATORVASTATIN CALCIUM 40 MG PO TABS
40.0000 mg | ORAL_TABLET | Freq: Every day | ORAL | Status: DC
  Administered 2018-11-05 – 2018-11-06 (×2): 40 mg via ORAL
  Filled 2018-11-02 (×2): qty 1

## 2018-11-02 MED ORDER — SENNOSIDES-DOCUSATE SODIUM 8.6-50 MG PO TABS
1.0000 | ORAL_TABLET | Freq: Two times a day (BID) | ORAL | Status: DC
  Administered 2018-11-04 – 2018-11-07 (×6): 1 via ORAL
  Filled 2018-11-02 (×7): qty 1

## 2018-11-02 MED ORDER — ASPIRIN EC 81 MG PO TBEC
81.0000 mg | DELAYED_RELEASE_TABLET | Freq: Every day | ORAL | Status: DC
  Administered 2018-11-05 – 2018-11-07 (×3): 81 mg via ORAL
  Filled 2018-11-02 (×3): qty 1

## 2018-11-02 MED ORDER — SODIUM CHLORIDE 0.9% FLUSH
3.0000 mL | Freq: Two times a day (BID) | INTRAVENOUS | Status: DC
  Administered 2018-11-02 – 2018-11-07 (×7): 3 mL via INTRAVENOUS

## 2018-11-02 MED ORDER — ONDANSETRON HCL 4 MG/2ML IJ SOLN
4.0000 mg | Freq: Four times a day (QID) | INTRAMUSCULAR | Status: DC | PRN
  Administered 2018-11-04: 4 mg via INTRAVENOUS
  Filled 2018-11-02: qty 2

## 2018-11-02 MED ORDER — APIXABAN 5 MG PO TABS: 5.0000 mg | ORAL_TABLET | Freq: Two times a day (BID) | ORAL | Status: DC

## 2018-11-02 MED ORDER — HYDRALAZINE HCL 20 MG/ML IJ SOLN
10.0000 mg | Freq: Four times a day (QID) | INTRAMUSCULAR | Status: DC | PRN
  Administered 2018-11-04 – 2018-11-05 (×2): 10 mg via INTRAVENOUS
  Filled 2018-11-02 (×3): qty 1

## 2018-11-02 NOTE — H&P (Signed)
History and Physical    [name]:[id number] DOB: 1941-02-01 DOA: 11/01/2018  PCP: Kari Baars, MD   Patient coming from: SNF  Chief Complaint: Lethargy   HPI: [name] is a 77 y.o. female with medical history significant for seizure disorder, atrial fibrillation on Eliquis, chronic diastolic CHF, type 2 diabetes mellitus, now presenting to the emergency department from her nursing facility for evaluation of lethargy.  She is accompanied by her daughter and granddaughter who assists with the history.  Patient seem to be in her usual state of health 3 days ago, had not been complaining of anything, was started on baclofen, and has since become lethargic, sleeping throughout the day and remaining in bed.  Patient has not been vomiting, not having diarrhea, has not appeared to be short of breath, and will wake briefly before falling back asleep.  Most recent fall was approximately 2 weeks ago, but she seemed to be in her usual state of health after that.  ED Course: Upon arrival to the ED, patient is found to be afebrile, saturating well on room air, and with vitals otherwise stable.  EKG features atrial fibrillation with PVCs and LVH with IVCD.  Chest x-ray is negative for acute cardiopulmonary disease and no acute intracranial abnormality is noted on head CT.  Chemistry panel features a creatinine 1.17, better than priors.  CBC is unremarkable.  Lactic acid is reassuringly normal.  Troponin is undetectable.  Urinalysis with mild proteinuria.  Patient was given 500 cc normal saline in the ED.  She remains lethargic and will be observed in the hospital for ongoing evaluation and management of this.  Review of Systems:  All other systems reviewed and apart from HPI, are negative.  Past Medical History:  Diagnosis Date  . Anemia   . Anginal pain (HCC)    "all the time, but not heart related"  . Arthritis    "legs and back" (09/30/2013)  . Asthma    years ago  . Atrial  fibrillation (HCC)   . 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 Blurred vision, bilateral    sometimes double vision  . CAD (coronary artery disease)    multivessel, CABG 6/10.  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  Myoview (11/20/11) showed no ischemia & EF 62%  . Chronic lower back pain   . Confusion   . COPD (chronic obstructive pulmonary disease) (HCC)    severe lung disease by PFTs 6/12  . DDD (degenerative disc disease)   . Deep vein thrombosis, upper right extremity (HCC)    post-PPM  . Dehiscence of closure of sternum or sternotomy 07/2009   sternal infection; required flap closure  . Dementia (HCC)   . Diabetes mellitus, type II (HCC)   . Gastroesophageal reflux disease    not much  . H/O hiatal hernia   . History of kidney stones   . Hyperlipidemia   . Hypertension   . Migraines    "years ago" (09/30/2013)  . Obesity   . Orthopnea    sometimes  . Pacemaker   . PUD (peptic ulcer disease)   . Seizures (HCC)    "long time ago; don't remember what they were related to" (09/30/2013)none recent  . Sleep apnea    denies CPAP use on 09/30/2013  . 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 Urge incontinence of urine     Past Surgical History:  Procedure Laterality  Date  . ABDOMINAL HYSTERECTOMY     partial  . APPENDECTOMY    . BACK SURGERY     x3 total  . CESAREAN SECTION  ~1980  . CHOLECYSTECTOMY    . COLONOSCOPY  04/27/2005   hemorrhoids, diverticula  . COLONOSCOPY WITH ESOPHAGOGASTRODUODENOSCOPY (EGD) N/A 02/20/2013   RMR: pancolonic diverticulosis and internal hemorrhoids. EGD with small hiatal hernia, healed gastric ulcer  . CORONARY ARTERY BYPASS GRAFT  05/1999   - LIMA to LAD, SVG to OM, SVG to PDA  . ESOPHAGOGASTRODUODENOSCOPY     with [facility] dilation,  biopsy, and disruption Schatzki's ring  . ESOPHAGOGASTRODUODENOSCOPY  09/10/2012   RMR: Noncritical Schatzki's ring. Diffuse gastric erosions and an  area of  partially healing ulceration-status post biopsy/ Small hiatal hernia. Bx reactive gastropathy.  . INSERT / REPLACE / REMOVE PACEMAKER  1993  . JOINT REPLACEMENT    . LUMBAR DISC SURGERY    . PACEMAKER PLACEMENT  1993   Status post DDD pacemaker implantation in 1993, with Medtronic Kappa generator change in 2003 for  second-degree AV block, most recent gen change by Fawn Kirk 04/14/11  . PORTACATH PLACEMENT Left 07/17/2013   Procedure: INSERTION PORT-A-CATH-left subclavian;  Surgeon: Fabio Bering, MD;  Location: [facility];  Service: General;  Laterality: Left;  . POSTERIOR LUMBAR FUSION    . RECONSTRUCTIVE REPAIR STERNAL     for infection s/p CABG 8/10  . REPLACEMENT TOTAL KNEE Bilateral   . THYROIDECTOMY    . TOTAL HIP ARTHROPLASTY Right   . TOTAL HIP ARTHROPLASTY Left 12/03/2013   Procedure: LEFT TOTAL HIP ARTHROPLASTY ANTERIOR APPROACH;  Surgeon: Shelda Pal, MD;  Location: [facility];  Service: Orthopedics;  Laterality: Left;  . TUBAL LIGATION    . WRIST SURGERY Left    "tumor taken off" (09/30/2013)     reports that she has never smoked. She has quit using smokeless tobacco.  Her smokeless tobacco use included snuff. She reports that she does not drink alcohol or use drugs.  Allergies  Allergen Reactions  . Codeine Other (See Comments)    Reaction:  Headaches   . Food Nausea And Vomiting and Other (See Comments)    Pt is allergic to leafy raw vegetables and seeds.    . Levaquin [Levofloxacin] Other (See Comments)    Confusion, altered mental status, inability to walk  . Oxycodone Hcl Other (See Comments)    Reaction:  Headaches   . Peanut-Containing Drug Products Nausea And Vomiting  . Reglan [Metoclopramide] Other (See Comments)    Reaction:  Confusion     Family History  Problem Relation Age of Onset  . Heart disease Mother        MI  . Colon cancer Father        age 58  . Diabetes Unknown   . Coronary artery disease Unknown        Female <55  . Cancer Unknown      Prior  to Admission medications   Medication Sig Start Date End Date Taking? Authorizing Provider  acetaminophen (TYLENOL) 325 MG tablet Take 325-650 mg by mouth every 6 (six) hours as needed for mild pain, moderate pain or headache.     [provider]  amLODipine (NORVASC) 10 MG tablet Take 1 tablet (10 mg total) daily by mouth. 10/23/17   Kari Baars, MD  apixaban (ELIQUIS) 5 MG TABS tablet Take 5 mg by mouth 2 (two) times daily.     [provider]  aspirin  EC 81 MG tablet Take 81 mg by mouth daily.    [provider]  atorvastatin (LIPITOR) 40 MG tablet Take 40 mg by mouth at bedtime.    [provider]  ferrous sulfate 325 (65 FE) MG tablet Take 325 mg by mouth daily with breakfast.    [provider]  furosemide (LASIX) 40 MG tablet Take 40 mg by mouth daily as needed for fluid.    [provider]  gabapentin (NEURONTIN) 100 MG capsule Take 200 mg by mouth at bedtime.    [provider]  ibuprofen (ADVIL,MOTRIN) 200 MG tablet Take 600 mg by mouth every 6 (six) hours as needed for headache, mild pain or moderate pain.    [provider]  levETIRAcetam (KEPPRA) 500 MG tablet Take 500 mg by mouth 2 (two) times daily.    [provider]  lubiprostone (AMITIZA) 24 MCG capsule Take 24 mcg by mouth 2 (two) times daily with a meal.    [provider]  Melatonin 3 MG TABS Take 3 mg by mouth at bedtime as needed (for sleep).    [provider]  metFORMIN (GLUCOPHAGE) 500 MG tablet Take 500 mg by mouth 2 (two) times daily with a meal.     [provider]  metoprolol tartrate (LOPRESSOR) 25 MG tablet Take 12.5 mg by mouth 2 (two) times daily.     [provider]  nitroGLYCERIN (NITROSTAT) 0.4 MG SL tablet Place 1 tablet (0.4 mg total) under the tongue every 5 (five) minutes as needed for chest pain. 05/20/14   Antoine PocheBranch, Jonathan F, MD  omega-3 acid ethyl esters (LOVAZA) 1 G capsule Take 2 g by  mouth 2 (two) times daily.     [provider]  ondansetron (ZOFRAN-ODT) 4 MG disintegrating tablet Take 4 mg by mouth every 8 (eight) hours as needed for nausea or vomiting.    [provider]  pantoprazole (PROTONIX) 40 MG tablet Take 1 tablet (40 mg total) by mouth daily. 12/03/13   Gelene MinkBoone, Anna W, NP  potassium chloride (K-DUR,KLOR-CON) 10 MEQ tablet Take 10 mEq by mouth daily.    [provider]  senna-docusate (SENEXON-S) 8.6-50 MG tablet Take 1 tablet by mouth 2 (two) times daily.    [provider]  traZODone (DESYREL) 50 MG tablet Take 50 mg by mouth at bedtime.    [provider]    Physical Exam: Vitals:   11/01/18 2328 11/01/18 2329 11/01/18 2350 11/02/18 0000  BP:  (!) 74/56 95/60 110/60  Pulse: (!) 59 (!) 58 (!) 58 (!) 58  Resp: 15 16 15 13   Temp:      TempSrc:      SpO2:   96% 96%    Constitutional: NAD, somnolent  Eyes: PERTLA, lids and conjunctivae normal ENMT: Mucous membranes are moist. Posterior pharynx clear of any exudate or lesions.   Neck: normal, supple, no masses, no thyromegaly Respiratory: clear to auscultation bilaterally, no wheezing, no crackles. Normal respiratory effort.    Cardiovascular: Rate ~60 and irregular. No extremity edema.   Abdomen: No distension, no tenderness, soft. Bowel sounds normal.  Musculoskeletal: no clubbing / cyanosis. No joint deformity upper and lower extremities.    Skin: no significant rashes, lesions, ulcers. Warm, dry, well-perfused. Neurologic: No facial asymmetry. PERRL. Patellar DTRs intact.  Psychiatric: Somnolent, easily woken and makes brief eye-contact before falling back asleep.     Labs on Admission: I have personally reviewed following labs and imaging studies  CBC:  Recent Labs  Lab 11/01/18 2250  WBC 6.8  NEUTROABS 4.3  HGB 12.1  HCT 39.8  MCV 90.5  PLT 169   Basic Metabolic Panel: Recent Labs  Lab 11/01/18 2250  NA 139  K 3.8  CL 110  CO2 22    GLUCOSE 100*  BUN 30*  CREATININE 1.17*  CALCIUM 9.7   GFR: CrCl cannot be calculated (Unknown ideal weight.). Liver Function Tests: Recent Labs  Lab 11/01/18 2250  AST 16  ALT 10  ALKPHOS 62  BILITOT 0.7  PROT 7.3  ALBUMIN 3.5   No results for input(s): LIPASE, AMYLASE in the last 168 hours. No results for input(s): AMMONIA in the last 168 hours. Coagulation Profile: Recent Labs  Lab 11/01/18 2250  INR 1.66   Cardiac Enzymes: Recent Labs  Lab 11/01/18 2250  TROPONINI <0.03   BNP (last 3 results) No results for input(s): PROBNP in the last 8760 hours. HbA1C: No results for input(s): HGBA1C in the last 72 hours. CBG: Recent Labs  Lab 11/01/18 2058  GLUCAP 107*   Lipid Profile: No results for input(s): CHOL, HDL, LDLCALC, TRIG, CHOLHDL, LDLDIRECT in the last 72 hours. Thyroid Function Tests: No results for input(s): TSH, T4TOTAL, FREET4, T3FREE, THYROIDAB in the last 72 hours. Anemia Panel: No results for input(s): VITAMINB12, FOLATE, FERRITIN, TIBC, IRON, RETICCTPCT in the last 72 hours. Urine analysis:    Component Value Date/Time   COLORURINE YELLOW 11/01/2018 2101   APPEARANCEUR HAZY (A) 11/01/2018 2101   LABSPEC 1.016 11/01/2018 2101   PHURINE 5.0 11/01/2018 2101   GLUCOSEU NEGATIVE 11/01/2018 2101   HGBUR NEGATIVE 11/01/2018 2101   BILIRUBINUR NEGATIVE 11/01/2018 2101   KETONESUR NEGATIVE 11/01/2018 2101   PROTEINUR 30 (A) 11/01/2018 2101   UROBILINOGEN 0.2 02/12/2015 0951   NITRITE NEGATIVE 11/01/2018 2101   LEUKOCYTESUR NEGATIVE 11/01/2018 2101   Sepsis Labs: @LABRCNTIP (procalcitonin:4,lacticidven:4) )No results found for this or any previous visit (from the past 240 hour(s)).   Radiological Exams on Admission: Dg Chest 1 View  Result Date: 11/01/2018 CLINICAL DATA:  Altered mental status EXAM: CHEST  1 VIEW COMPARISON:  10/18/2017 FINDINGS: Left-sided central venous port tip over the SVC. Right-sided pacing device as before.  Cardiomegaly without acute opacity or pleural effusion. No pneumothorax. IMPRESSION: No active disease.  Cardiomegaly Electronically Signed   By: Jasmine Pang M.D.   On: 11/01/2018 21:30   Ct Head Wo Contrast  Result Date: 11/01/2018 CLINICAL DATA:  Initial evaluation for acute lethargy, confusion, decreased level of consciousness. EXAM: CT HEAD WITHOUT CONTRAST TECHNIQUE: Contiguous axial images were obtained from the base of the skull through the vertex without intravenous contrast. COMPARISON:  Prior CT from 09/26/2017. FINDINGS: Brain: Generalized age-related cerebral atrophy with moderate to advanced chronic microvascular ischemic disease, mildly progressed relative to 2018. No acute intracranial hemorrhage. No acute large vessel territory infarct. No mass lesion, midline shift or mass effect. No hydrocephalus. No extra-axial fluid collection. Vascular: No hyperdense vessel. Calcified atherosclerosis at the skull base. Skull: Scalp soft tissues and calvarium demonstrate no acute abnormality. Sinuses/Orbits: Globes and orbital soft tissues within normal limits. Paranasal sinuses and mastoid air cells are clear. Other: None. IMPRESSION: 1. No acute intracranial abnormality. 2. Generalized age-related cerebral atrophy with moderate to advanced chronic microvascular ischemic changes, mildly progressed relative to 2018. Electronically Signed   By: Rise Mu M.D.   On: 11/01/2018 22:07    EKG: Independently reviewed. Atrial fibrillation, PVC's, LVH with IVCD.   Assessment/Plan   1. Acute encephalopathy  -  Presents with 2-3 days of lethargy after starting baclofen  - Baclofen associated with encephalopathy in patients with CKD, would expect return to normal over the next day if this is the etiology  - Check TSH, ammonia, b12, folate, and RPR; hold baclofen, continue supportive care  - If pending labs unrevealing and she fails to improve after holding baclofen, could consider MRI brain  though no focal deficit detected    2. CKD stage III  - SCr is 1.17 on admission, better than priors  - Renally-dose medications   3. Chronic diastolic CHF  - Appears compensated  - Hold Lasix for now while she is not eating/drinking  - Follow daily wts  4. Seizure disorder  - No seizure-like activity   - Continue Keppra    5. Atrial fibrillation  - In rate-controlled a fib on admission  - CHADS-VASc at least 4 (age x2, gender, CHF, HTN, DM)  - Continue Eliquis   6. Hypertension  - BP on low side in ED, will hold antihypertensives initially     DVT prophylaxis: Eliquis  Code Status: Full   Family Communication: Daughter and granddaughter updated at bedside Consults called: None Admission status: Observation     Briscoe Deutscher, MD [facility] Pager [phone]  If 7PM-7AM, please contact night-coverage [email or web address] Password [email or web address]  11/02/2018, 12:14 AM

## 2018-11-02 NOTE — Progress Notes (Signed)
Subjective: She is unresponsive.  Seeing the history that she got baclofen prior to her change in mental status I suspect that is the problem.  She has had multiple episodes of altered mental status related to medications and it frequently takes her several days to get over that.  Objective: Vital signs in last 24 hours: Temp:  [97.8 F (36.6 C)-98.3 F (36.8 C)] 98.1 F (36.7 C) (11/29 0646) Pulse Rate:  [57-77] 76 (11/29 0646) Resp:  [13-33] 15 (11/29 0210) BP: (74-165)/(45-136) 165/136 (11/29 0646) SpO2:  [96 %-100 %] 98 % (11/29 0646) Weight:  [81.9 kg] 81.9 kg (11/29 0548) Weight change:  Last BM Date: (UTA)  Intake/Output from previous day: 11/28 0701 - 11/29 0700 In: 696.4 [I.V.:196.4; IV Piggyback:500] Out: -   PHYSICAL EXAM General appearance: She opens her eyes.  She apparently had some response to her nurse earlier this morning but not now Resp: clear to auscultation bilaterally Cardio: irregularly irregular rhythm GI: soft, non-tender; bowel sounds normal; no masses,  no organomegaly Extremities: extremities normal, atraumatic, no cyanosis or edema  Lab Results:  Results for orders placed or performed during the hospital encounter of 11/01/18 (from the past 48 hour(s))  CBG monitoring, ED     Status: Abnormal   Collection Time: 11/01/18  8:58 PM  Result Value Ref Range   Glucose-Capillary 107 (H) 70 - 99 mg/dL  Urinalysis, Routine w reflex microscopic     Status: Abnormal   Collection Time: 11/01/18  9:01 PM  Result Value Ref Range   Color, Urine YELLOW YELLOW   APPearance HAZY (A) CLEAR   Specific Gravity, Urine 1.016 1.005 - 1.030   pH 5.0 5.0 - 8.0   Glucose, UA NEGATIVE NEGATIVE mg/dL   Hgb urine dipstick NEGATIVE NEGATIVE   Bilirubin Urine NEGATIVE NEGATIVE   Ketones, ur NEGATIVE NEGATIVE mg/dL   Protein, ur 30 (A) NEGATIVE mg/dL   Nitrite NEGATIVE NEGATIVE   Leukocytes, UA NEGATIVE NEGATIVE   RBC / HPF 0-5 0 - 5 RBC/hpf   WBC, UA 0-5 0 - 5 WBC/hpf    Bacteria, UA NONE SEEN NONE SEEN   Mucus PRESENT    Hyaline Casts, UA PRESENT     Comment: Performed at [facility], [address] [place], [place] [address]  Comprehensive metabolic panel     Status: Abnormal   Collection Time: 11/01/18 10:50 PM  Result Value Ref Range   Sodium 139 135 - 145 mmol/L   Potassium 3.8 3.5 - 5.1 mmol/L   Chloride 110 98 - 111 mmol/L   CO2 22 22 - 32 mmol/L   Glucose, Bld 100 (H) 70 - 99 mg/dL   BUN 30 (H) 8 - 23 mg/dL   Creatinine, Ser 1.91 (H) 0.44 - 1.00 mg/dL   Calcium 9.7 8.9 - 47.8 mg/dL   Total Protein 7.3 6.5 - 8.1 g/dL   Albumin 3.5 3.5 - 5.0 g/dL   AST 16 15 - 41 U/L   ALT 10 0 - 44 U/L   Alkaline Phosphatase 62 38 - 126 U/L   Total Bilirubin 0.7 0.3 - 1.2 mg/dL   GFR calc non Af Amer 44 (L) >60 mL/min   GFR calc Af Amer 51 (L) >60 mL/min   Anion gap 7 5 - 15    Comment: Performed at [facility], [address] [place], [place] [address]  Troponin I - Once     Status: None   Collection Time: 11/01/18 10:50 PM  Result Value Ref Range  Troponin I <0.03 <0.03 ng/mL    Comment: Performed at [facility], [address] [place], [place] [address]  Lactic acid, plasma     Status: None   Collection Time: 11/01/18 10:50 PM  Result Value Ref Range   Lactic Acid, Venous 0.7 0.5 - 1.9 mmol/L    Comment: Performed at [facility], [address] [place], [place] [address]  CBC with Differential     Status: None   Collection Time: 11/01/18 10:50 PM  Result Value Ref Range   WBC 6.8 4.0 - 10.5 K/uL   RBC 4.40 3.87 - 5.11 MIL/uL   Hemoglobin 12.1 12.0 - 15.0 g/dL   HCT 09.839.8 11.936.0 - 14.746.0 %   MCV 90.5 80.0 - 100.0 fL   MCH 27.5 26.0 - 34.0 pg   MCHC 30.4 30.0 - 36.0 g/dL   RDW 82.915.3 56.211.5 - 13.015.5 %   Platelets 169 150 - 400 K/uL   nRBC 0.0 0.0 - 0.2 %   Neutrophils Relative % 64 %   Neutro Abs 4.3 1.7 - 7.7 K/uL   Lymphocytes Relative 24 %   Lymphs Abs 1.6 0.7 - 4.0 K/uL   Monocytes Relative 8 %   Monocytes Absolute 0.5 0.1 - 1.0 K/uL    Eosinophils Relative 3 %   Eosinophils Absolute 0.2 0.0 - 0.5 K/uL   Basophils Relative 1 %   Basophils Absolute 0.0 0.0 - 0.1 K/uL   Immature Granulocytes 0 %   Abs Immature Granulocytes 0.03 0.00 - 0.07 K/uL    Comment: Performed at [facility], [address] [place], [place] [address]  Protime-INR     Status: Abnormal   Collection Time: 11/01/18 10:50 PM  Result Value Ref Range   Prothrombin Time 19.4 (H) 11.4 - 15.2 seconds   INR 1.66     Comment: Performed at [facility], [address] [place], [place] [address]  TSH     Status: None   Collection Time: 11/01/18 10:50 PM  Result Value Ref Range   TSH 1.404 0.350 - 4.500 uIU/mL    Comment: Performed by a 3rd Generation assay with a functional sensitivity of <=0.01 uIU/mL. Performed at [facility], [address] [place], [place] [address]   CBG monitoring, ED     Status: None   Collection Time: 11/02/18 12:38 AM  Result Value Ref Range   Glucose-Capillary 94 70 - 99 mg/dL  Blood gas, arterial     Status: Abnormal   Collection Time: 11/02/18 12:45 AM  Result Value Ref Range   FIO2 21.00    pH, Arterial 7.310 (L) 7.350 - 7.450   pCO2 arterial 47.3 32.0 - 48.0 mmHg   pO2, Arterial 75.6 (L) 83.0 - 108.0 mmHg   Bicarbonate 22.0 20.0 - 28.0 mmol/L   Acid-base deficit 2.2 (H) 0.0 - 2.0 mmol/L   O2 Saturation 93.5 %   Patient temperature 37.0    Collection site LEFT RADIAL    Drawn by [id number]    Sample type ARTERIAL DRAW    Allens test (pass/fail) PASS PASS    Comment: Performed at [facility], [address] [place], [place] [address]  Ammonia     Status: None   Collection Time: 11/02/18 12:55 AM  Result Value Ref Range   Ammonia 21 9 - 35 umol/L    Comment: Performed at [facility], [address] [place], [place] [address]  Vitamin B12     Status: None   Collection Time: 11/02/18 12:55 AM  Result Value Ref Range   Vitamin B-12 414 180 - 914 pg/mL    Comment: (NOTE) This assay is not validated for testing neonatal  or myeloproliferative syndrome specimens for Vitamin B12 levels. Performed at [facility], [address] [place], [place] [address]   CK     Status: Abnormal   Collection Time: 11/02/18 12:57 AM  Result Value Ref Range   Total CK 32 (L) 38 - 234 U/L    Comment: Performed at [facility], [address], [place], [place] [address]  MRSA PCR Screening     Status: Abnormal   Collection Time: 11/02/18  1:51 AM  Result Value Ref Range   MRSA by PCR POSITIVE (A) NEGATIVE    Comment:        The GeneXpert MRSA Assay (FDA approved for NASAL specimens only), is one component of a comprehensive MRSA colonization surveillance program. It is not intended to diagnose MRSA infection nor to guide or monitor treatment for MRSA infections. RESULT CALLED TO, READ BACK BY AND VERIFIED WITH: FOLEY,B AT 0840 BY HUFFINES,S ON 11/02/18. Performed at [facility], [address], [place], [place] [address]   Basic metabolic panel     Status: Abnormal   Collection Time: 11/02/18  6:30 AM  Result Value Ref Range   Sodium 141 135 - 145 mmol/L   Potassium 4.5 3.5 - 5.1 mmol/L   Chloride 109 98 - 111 mmol/L   CO2 22 22 - 32 mmol/L   Glucose, Bld 100 (H) 70 - 99 mg/dL   BUN 26 (H) 8 - 23 mg/dL   Creatinine, Ser 9.14 (H) 0.44 - 1.00 mg/dL   Calcium 78.2 8.9 - 95.6 mg/dL   GFR calc non Af Amer 43 (L) >60 mL/min   GFR calc Af Amer 49 (L) >60 mL/min   Anion gap 10 5 - 15    Comment: Performed at [facility], [address] [place], [place] [address]  CBC with Differential/Platelet     Status: Abnormal   Collection Time: 11/02/18  6:30 AM  Result Value Ref Range   WBC 6.2 4.0 - 10.5 K/uL   RBC 5.22 (H) 3.87 - 5.11 MIL/uL   Hemoglobin 14.3 12.0 - 15.0 g/dL   HCT 65.7 (H) 84.6 - 96.2 %   MCV 90.4 80.0 - 100.0 fL   MCH 27.4 26.0 - 34.0 pg   MCHC 30.3 30.0 - 36.0 g/dL   RDW 95.2 84.1 - 32.4 %   Platelets 164 150 - 400 K/uL   nRBC 0.0 0.0 - 0.2 %   Neutrophils Relative % 68 %   Neutro Abs 4.2 1.7 -  7.7 K/uL   Lymphocytes Relative 21 %   Lymphs Abs 1.3 0.7 - 4.0 K/uL   Monocytes Relative 7 %   Monocytes Absolute 0.4 0.1 - 1.0 K/uL   Eosinophils Relative 3 %   Eosinophils Absolute 0.2 0.0 - 0.5 K/uL   Basophils Relative 1 %   Basophils Absolute 0.0 0.0 - 0.1 K/uL   Immature Granulocytes 0 %   Abs Immature Granulocytes 0.02 0.00 - 0.07 K/uL    Comment: Performed at [facility], [address], [place], [place] [address]  Glucose, capillary     Status: None   Collection Time: 11/02/18  7:41 AM  Result Value Ref Range   Glucose-Capillary 91 70 - 99 mg/dL  Glucose, capillary     Status: None   Collection Time: 11/02/18 11:26 AM  Result Value Ref Range  Glucose-Capillary 83 70 - 99 mg/dL    ABGS Recent Labs    11/02/18 0045  PHART 7.310*  PO2ART 75.6*  HCO3 22.0   CULTURES Recent Results (from the past 240 hour(s))  MRSA PCR Screening     Status: Abnormal   Collection Time: 11/02/18  1:51 AM  Result Value Ref Range Status   MRSA by PCR POSITIVE (A) NEGATIVE Final    Comment:        The GeneXpert MRSA Assay (FDA approved for NASAL specimens only), is one component of a comprehensive MRSA colonization surveillance program. It is not intended to diagnose MRSA infection nor to guide or monitor treatment for MRSA infections. RESULT CALLED TO, READ BACK BY AND VERIFIED WITH: FOLEY,B AT 0840 BY HUFFINES,S ON 11/02/18. Performed at [facility], [address] [place], [place] [address]    Studies/Results: Dg Chest 1 View  Result Date: 11/01/2018 CLINICAL DATA:  Altered mental status EXAM: CHEST  1 VIEW COMPARISON:  10/18/2017 FINDINGS: Left-sided central venous port tip over the SVC. Right-sided pacing device as before. Cardiomegaly without acute opacity or pleural effusion. No pneumothorax. IMPRESSION: No active disease.  Cardiomegaly Electronically Signed   By: Jasmine Pang M.D.   On: 11/01/2018 21:30   Ct Head Wo Contrast  Result Date: 11/01/2018 CLINICAL  DATA:  Initial evaluation for acute lethargy, confusion, decreased level of consciousness. EXAM: CT HEAD WITHOUT CONTRAST TECHNIQUE: Contiguous axial images were obtained from the base of the skull through the vertex without intravenous contrast. COMPARISON:  Prior CT from 09/26/2017. FINDINGS: Brain: Generalized age-related cerebral atrophy with moderate to advanced chronic microvascular ischemic disease, mildly progressed relative to 2018. No acute intracranial hemorrhage. No acute large vessel territory infarct. No mass lesion, midline shift or mass effect. No hydrocephalus. No extra-axial fluid collection. Vascular: No hyperdense vessel. Calcified atherosclerosis at the skull base. Skull: Scalp soft tissues and calvarium demonstrate no acute abnormality. Sinuses/Orbits: Globes and orbital soft tissues within normal limits. Paranasal sinuses and mastoid air cells are clear. Other: None. IMPRESSION: 1. No acute intracranial abnormality. 2. Generalized age-related cerebral atrophy with moderate to advanced chronic microvascular ischemic changes, mildly progressed relative to 2018. Electronically Signed   By: Rise Mu M.D.   On: 11/01/2018 22:07    Medications:  Prior to Admission:  Medications Prior to Admission  Medication Sig Dispense Refill Last Dose  . acetaminophen (TYLENOL) 325 MG tablet Take 325-650 mg by mouth every 6 (six) hours as needed for mild pain, moderate pain or headache.    10/18/2017 at Unknown time  . amLODipine (NORVASC) 10 MG tablet Take 1 tablet (10 mg total) daily by mouth.     Marland Kitchen apixaban (ELIQUIS) 5 MG TABS tablet Take 5 mg by mouth 2 (two) times daily.    10/18/2017 at 600  . aspirin EC 81 MG tablet Take 81 mg by mouth daily.   10/18/2017 at 600  . atorvastatin (LIPITOR) 40 MG tablet Take 40 mg by mouth at bedtime.   10/17/2017 at Unknown time  . ferrous sulfate 325 (65 FE) MG tablet Take 325 mg by mouth daily with breakfast.   10/18/2017 at Unknown time  .  furosemide (LASIX) 40 MG tablet Take 40 mg by mouth daily as needed for fluid.   10/18/2017 at Unknown time  . gabapentin (NEURONTIN) 100 MG capsule Take 200 mg by mouth at bedtime.   10/17/2017 at Unknown time  . ibuprofen (ADVIL,MOTRIN) 200 MG tablet Take 600 mg by mouth every 6 (six)  hours as needed for headache, mild pain or moderate pain.   Past Month at Unknown time  . levETIRAcetam (KEPPRA) 500 MG tablet Take 500 mg by mouth 2 (two) times daily.   10/18/2017 at Unknown time  . lubiprostone (AMITIZA) 24 MCG capsule Take 24 mcg by mouth 2 (two) times daily with a meal.   10/18/2017 at Unknown time  . Melatonin 3 MG TABS Take 3 mg by mouth at bedtime as needed (for sleep).   10/17/2017 at Unknown time  . metFORMIN (GLUCOPHAGE) 500 MG tablet Take 500 mg by mouth 2 (two) times daily with a meal.    10/18/2017 at Unknown time  . metoprolol tartrate (LOPRESSOR) 25 MG tablet Take 12.5 mg by mouth 2 (two) times daily.    10/18/2017 at 600  . nitroGLYCERIN (NITROSTAT) 0.4 MG SL tablet Place 1 tablet (0.4 mg total) under the tongue every 5 (five) minutes as needed for chest pain. 25 tablet 1 unknown  . omega-3 acid ethyl esters (LOVAZA) 1 G capsule Take 2 g by mouth 2 (two) times daily.    10/18/2017 at Unknown time  . ondansetron (ZOFRAN-ODT) 4 MG disintegrating tablet Take 4 mg by mouth every 8 (eight) hours as needed for nausea or vomiting.   Past Month at Unknown time  . pantoprazole (PROTONIX) 40 MG tablet Take 1 tablet (40 mg total) by mouth daily. 30 tablet 3 10/18/2017 at Unknown time  . potassium chloride (K-DUR,KLOR-CON) 10 MEQ tablet Take 10 mEq by mouth daily.   10/18/2017 at Unknown time  . senna-docusate (SENEXON-S) 8.6-50 MG tablet Take 1 tablet by mouth 2 (two) times daily.   10/18/2017 at Unknown time  . traZODone (DESYREL) 50 MG tablet Take 50 mg by mouth at bedtime.   10/17/2017 at Unknown time   Scheduled: . aspirin EC  81 mg Oral Daily  . atorvastatin  40 mg Oral q1800  .  enoxaparin (LOVENOX) injection  1 mg/kg Subcutaneous Q12H  . gabapentin  200 mg Oral QHS  . insulin aspart  0-5 Units Subcutaneous QHS  . insulin aspart  0-9 Units Subcutaneous TID WC  . lubiprostone  24 mcg Oral BID WC  . omega-3 acid ethyl esters  2 g Oral BID  . pantoprazole  40 mg Oral Daily  . senna-docusate  1 tablet Oral BID  . sodium chloride flush  3 mL Intravenous Q12H   Continuous: . levETIRAcetam     WUJ:[id number] **OR** acetaminophen, Melatonin, ondansetron **OR** ondansetron (ZOFRAN) IV, polyethylene glycol  Assesment: She was admitted with acute encephalopathy/altered mental status.  I think this is probably from medication.  She does have seizure disorder so we will check an EEG but there is no seizure activity mention from her nursing home  She has chronic atrial fib and she is not able to take her anticoagulants on going to give her full dose Lovenox Principal Problem:   Acute encephalopathy Active Problems:   Chronic diastolic heart failure (HCC)   Diabetes mellitus, type II (HCC)   Seizure disorder (HCC)   Atrial fibrillation (HCC)   CKD (chronic kidney disease), stage III (HCC)    Plan: Continue supportive care.  She should improve with time I think but will check EEG.    LOS: 0 days   Thomasa Heidler L 11/02/2018, 12:05 PM

## 2018-11-02 NOTE — Progress Notes (Signed)
Initial Nutrition Assessment  DOCUMENTATION CODES:  Obesity unspecified  INTERVENTION:  Pt NPO at time due to lethargy. Based on hx available, suspect she has had minimal intake x4 days.  Will monitor for increased alertness and diet advancement.   NUTRITION DIAGNOSIS:  Inadequate oral intake related to inability to eat, lethargy/confusion as evidenced by NPO status.  GOAL:  Patient will meet greater than or equal to 90% of their needs  MONITOR:  PO intake, Diet advancement, Labs, Weight trends, I & O's  REASON FOR ASSESSMENT:  Malnutrition Screening Tool    ASSESSMENT:  77 y/o w/ PMHx seizure disorder, AFIB, CHF, DM2, HTN/HLD, CAD s/p CABG, COPD, Dementia, Hiatal hernia, PUD, CVA. Presents from nursing home d/t lethargy. Daughter noted this began 3 days PTA and began after she started new medication, baclofen. Pt has been sleeping throughout each day since. Admitted for work up.    On RD arrival, pt still lethargic/unresponsive. Only offers quiet moans. From chart, pt has been sleeping throughout each day since starting baclofen (apparently this was 3 days pta). Assuming she ate little those days, she has had insufficient intake for atleast 4 days now.   Wt wise, chart shows the patient was about 187 lbs at this time last year. From info available, her wt appears to had been stable at 180-190 for past 5 years or so. Todays bed weight is 178.7 lbs.   Pt has been NPO since admit; she is not alert enough for PO intake. RD unable to offer any interventions until pt becomes more alert.   Labs: BG:80-100, BUN/Creat: 30/1.17->26/1.21 Meds: Insulin, Omega 3s, Senna, Amitiza, Keppra   Recent Labs  Lab 11/01/18 2250 11/02/18 0630  NA 139 141  K 3.8 4.5  CL 110 109  CO2 22 22  BUN 30* 26*  CREATININE 1.17* 1.21*  CALCIUM 9.7 10.2  GLUCOSE 100* 100*   NUTRITION - FOCUSED PHYSICAL EXAM:   Most Recent Value  Orbital Region  No depletion  Upper Arm Region  No depletion  Thoracic  and Lumbar Region  No depletion  Buccal Region  No depletion  Temple Region  No depletion  Clavicle Bone Region  No depletion  Clavicle and Acromion Bone Region  No depletion  Scapular Bone Region  Unable to assess  Dorsal Hand  No depletion  Patellar Region  No depletion  Anterior Thigh Region  No depletion  Posterior Calf Region  No depletion  Edema (RD Assessment)  None  Hair  Reviewed  Eyes  Reviewed  Mouth  Reviewed  Skin  Reviewed  Nails  Reviewed     Diet Order:   Diet Order            Diet NPO time specified  Diet effective now             EDUCATION NEEDS:  No education needs have been identified at this time  Skin:  Excoriation to anterior BLE  Last BM:  Unknown  Height:  Ht Readings from Last 1 Encounters:  10/18/17 5\' 2"  (1.575 m)   Weight:  Wt Readings from Last 1 Encounters:  11/02/18 81.1 kg   Wt Readings from Last 10 Encounters:  11/02/18 81.1 kg  10/18/17 86.6 kg  09/29/17 89.4 kg  09/19/17 84.8 kg  09/01/17 81.6 kg  11/15/16 81.6 kg  09/25/16 85.1 kg  08/29/16 84.3 kg  08/09/16 108.9 kg  05/26/16 86.5 kg   Ideal Body Weight:  50 kg  BMI:  Body mass index  is 32.68 kg/m.  Estimated Nutritional Needs:  Kcal:  1450-1700 (18-21 kcal/kg bw) Protein:  70-80g Pro (1.4-1.6 L/fluid) Fluid:  1.5-1.7 L fluid (471ml/kcal)  Christophe LouisNathan  RD, LDN, CNSC Clinical Nutrition Available Tues-Sat via Pager: [phone] 11/02/2018 1:03 PM

## 2018-11-02 NOTE — Progress Notes (Signed)
EEG Completed; Results Pending  

## 2018-11-02 NOTE — Progress Notes (Signed)
Pt is alert to voice only, if questions are asked to the patient she can respond. For example, RN asked patient if she knew where she was and she stated "yes." Unable to tell where she was until she was coached along. Pt is NPO, and also not alert enough to take PO medications. Will make MD aware. Will continue to monitor.

## 2018-11-02 NOTE — Progress Notes (Signed)
Patient received in room 331 in stable condition via stretcher accompanied by ED RN and NT.  Patient transferred to bed, skin check completed with Gean QuintB. Johnson, RN.  Telemetry placed and verified.  Call bell and phone placed in reach.  Patient responsive to painful stimuli only, no family present at this time.  RN will continue to monitor and make MD aware of any changes.  P.J. Henderson NewcomerSexton, RN

## 2018-11-02 NOTE — ED Notes (Signed)
Family states patient has had several falls with the last fall around 2 weeks ago. Patient remains lethargic and only responds to painful stimuli.

## 2018-11-02 NOTE — Progress Notes (Signed)
ANTICOAGULATION CONSULT NOTE - Initial Consult  Pharmacy Consult for lovenox Indication: atrial fibrillation.  Patient NPO so apixaban is on hold.  Allergies  Allergen Reactions  . Codeine Other (See Comments)    Reaction:  Headaches   . Food Nausea And Vomiting and Other (See Comments)    Pt is allergic to leafy raw vegetables and seeds.    . Levaquin [Levofloxacin] Other (See Comments)    Confusion, altered mental status, inability to walk  . Oxycodone Hcl Other (See Comments)    Reaction:  Headaches   . Peanut-Containing Drug Products Nausea And Vomiting  . Reglan [Metoclopramide] Other (See Comments)    Reaction:  Confusion     Patient Measurements: Weight: 180 lb 8.9 oz (81.9 kg)   Vital Signs: Temp: 98.1 F (36.7 C) (11/29 0646) Temp Source: Oral (11/29 0646) BP: 165/136 (11/29 0646) Pulse Rate: 76 (11/29 0646)  Labs: Recent Labs    11/01/18 2250 11/02/18 0057 11/02/18 0630  HGB 12.1  --  14.3  HCT 39.8  --  47.2*  PLT 169  --  164  LABPROT 19.4*  --   --   INR 1.66  --   --   CREATININE 1.17*  --  1.21*  CKTOTAL  --  32*  --   TROPONINI <0.03  --   --     CrCl cannot be calculated (Unknown ideal weight.).   Medical History: Past Medical History:  Diagnosis Date  . Anemia   . Anginal pain (HCC)    "all the time, but not heart related"  . Arthritis    "legs and back" (09/30/2013)  . Asthma    years ago  . Atrial fibrillation (HCC)   . 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 Blurred vision, bilateral    sometimes double vision  . CAD (coronary artery disease)    multivessel, CABG 6/10.  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  Myoview (11/20/11) showed no ischemia & EF 62%  . Chronic lower back pain   . Confusion   . COPD (chronic obstructive pulmonary disease) (HCC)    severe lung disease by PFTs 6/12  . DDD (degenerative disc disease)   . Deep vein thrombosis, upper right extremity (HCC)    post-PPM  . Dehiscence  of closure of sternum or sternotomy 07/2009   sternal infection; required flap closure  . Dementia (HCC)   . Diabetes mellitus, type II (HCC)   . Gastroesophageal reflux disease    not much  . H/O hiatal hernia   . History of kidney stones   . Hyperlipidemia   . Hypertension   . Migraines    "years ago" (09/30/2013)  . Obesity   . Orthopnea    sometimes  . Pacemaker   . PUD (peptic ulcer disease)   . Seizures (HCC)    "long time ago; don't remember what they were related to" (09/30/2013)none recent  . Sleep apnea    denies CPAP use on 09/30/2013  . 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 Urge incontinence of urine     Medications:  Medications Prior to Admission  Medication Sig Dispense Refill Last Dose  . acetaminophen (TYLENOL) 325 MG tablet Take 325-650 mg by mouth every 6 (six) hours as needed for mild pain, moderate pain or headache.    10/18/2017 at Unknown time  . amLODipine (NORVASC) 10 MG tablet Take 1 tablet (10 mg total) daily  by mouth.     Marland Kitchen. apixaban (ELIQUIS) 5 MG TABS tablet Take 5 mg by mouth 2 (two) times daily.    10/18/2017 at 600  . aspirin EC 81 MG tablet Take 81 mg by mouth daily.   10/18/2017 at 600  . atorvastatin (LIPITOR) 40 MG tablet Take 40 mg by mouth at bedtime.   10/17/2017 at Unknown time  . ferrous sulfate 325 (65 FE) MG tablet Take 325 mg by mouth daily with breakfast.   10/18/2017 at Unknown time  . furosemide (LASIX) 40 MG tablet Take 40 mg by mouth daily as needed for fluid.   10/18/2017 at Unknown time  . gabapentin (NEURONTIN) 100 MG capsule Take 200 mg by mouth at bedtime.   10/17/2017 at Unknown time  . ibuprofen (ADVIL,MOTRIN) 200 MG tablet Take 600 mg by mouth every 6 (six) hours as needed for headache, mild pain or moderate pain.   Past Month at Unknown time  . levETIRAcetam (KEPPRA) 500 MG tablet Take 500 mg by mouth 2 (two) times daily.   10/18/2017 at Unknown time  . lubiprostone (AMITIZA) 24 MCG capsule Take 24 mcg by  mouth 2 (two) times daily with a meal.   10/18/2017 at Unknown time  . Melatonin 3 MG TABS Take 3 mg by mouth at bedtime as needed (for sleep).   10/17/2017 at Unknown time  . metFORMIN (GLUCOPHAGE) 500 MG tablet Take 500 mg by mouth 2 (two) times daily with a meal.    10/18/2017 at Unknown time  . metoprolol tartrate (LOPRESSOR) 25 MG tablet Take 12.5 mg by mouth 2 (two) times daily.    10/18/2017 at 600  . nitroGLYCERIN (NITROSTAT) 0.4 MG SL tablet Place 1 tablet (0.4 mg total) under the tongue every 5 (five) minutes as needed for chest pain. 25 tablet 1 unknown  . omega-3 acid ethyl esters (LOVAZA) 1 G capsule Take 2 g by mouth 2 (two) times daily.    10/18/2017 at Unknown time  . ondansetron (ZOFRAN-ODT) 4 MG disintegrating tablet Take 4 mg by mouth every 8 (eight) hours as needed for nausea or vomiting.   Past Month at Unknown time  . pantoprazole (PROTONIX) 40 MG tablet Take 1 tablet (40 mg total) by mouth daily. 30 tablet 3 10/18/2017 at Unknown time  . potassium chloride (K-DUR,KLOR-CON) 10 MEQ tablet Take 10 mEq by mouth daily.   10/18/2017 at Unknown time  . senna-docusate (SENEXON-S) 8.6-50 MG tablet Take 1 tablet by mouth 2 (two) times daily.   10/18/2017 at Unknown time  . traZODone (DESYREL) 50 MG tablet Take 50 mg by mouth at bedtime.   10/17/2017 at Unknown time    Assessment: Pharmacy consulted to dose lovenox in patient with atrial fibrillation.  Patient is currently not taking any meds by mouth so NPO. Will hold home dose of apixaban and transition to lovenox.  Goal of Therapy:   Monitor platelets by anticoagulation protocol: Yes   Plan:  Lovenox 80 mg subq twice daily. Monitor labs and s/s of bleeding.  Salvatore DecentSteven C Daylan Juhnke 11/02/2018,11:40 AM

## 2018-11-03 LAB — HIV ANTIBODY (ROUTINE TESTING W REFLEX): HIV Screen 4th Generation wRfx: NONREACTIVE

## 2018-11-03 LAB — COMPREHENSIVE METABOLIC PANEL
ALBUMIN: 3.9 g/dL (ref 3.5–5.0)
ALT: 11 U/L (ref 0–44)
ANION GAP: 11 (ref 5–15)
AST: 24 U/L (ref 15–41)
Alkaline Phosphatase: 68 U/L (ref 38–126)
BUN: 17 mg/dL (ref 8–23)
CO2: 19 mmol/L — ABNORMAL LOW (ref 22–32)
Calcium: 9.9 mg/dL (ref 8.9–10.3)
Chloride: 107 mmol/L (ref 98–111)
Creatinine, Ser: 1.03 mg/dL — ABNORMAL HIGH (ref 0.44–1.00)
GFR calc Af Amer: 60 mL/min — ABNORMAL LOW (ref >60)
GFR calc non Af Amer: 52 mL/min — ABNORMAL LOW (ref >60)
Glucose, Bld: 110 mg/dL — ABNORMAL HIGH (ref 70–99)
Potassium: 3.6 mmol/L (ref 3.5–5.1)
Sodium: 137 mmol/L (ref 135–145)
Total Bilirubin: 1.3 mg/dL — ABNORMAL HIGH (ref 0.3–1.2)
Total Protein: 8.4 g/dL — ABNORMAL HIGH (ref 6.5–8.1)

## 2018-11-03 LAB — CBC WITH DIFFERENTIAL/PLATELET
Abs Immature Granulocytes: 0.03 10*3/uL (ref 0.00–0.07)
BASOS ABS: 0 10*3/uL (ref 0.0–0.1)
Basophils Relative: 0 %
EOS PCT: 3 %
Eosinophils Absolute: 0.3 10*3/uL (ref 0.0–0.5)
HCT: 43.2 % (ref 36.0–46.0)
Hemoglobin: 13.4 g/dL (ref 12.0–15.0)
Immature Granulocytes: 0 %
Lymphocytes Relative: 10 %
Lymphs Abs: 1 10*3/uL (ref 0.7–4.0)
MCH: 27.2 pg (ref 26.0–34.0)
MCHC: 31 g/dL (ref 30.0–36.0)
MCV: 87.8 fL (ref 80.0–100.0)
Monocytes Absolute: 0.7 10*3/uL (ref 0.1–1.0)
Monocytes Relative: 7 %
Neutro Abs: 7.4 10*3/uL (ref 1.7–7.7)
Neutrophils Relative %: 80 %
Platelets: 169 10*3/uL (ref 150–400)
RBC: 4.92 MIL/uL (ref 3.87–5.11)
RDW: 15.1 % (ref 11.5–15.5)
WBC: 9.3 10*3/uL (ref 4.0–10.5)
nRBC: 0 % (ref 0.0–0.2)

## 2018-11-03 LAB — GLUCOSE, CAPILLARY
Glucose-Capillary: 122 mg/dL — ABNORMAL HIGH (ref 70–99)
Glucose-Capillary: 126 mg/dL — ABNORMAL HIGH (ref 70–99)
Glucose-Capillary: 112 mg/dL — ABNORMAL HIGH (ref 70–99)

## 2018-11-03 LAB — URINE CULTURE: Culture: NO GROWTH

## 2018-11-03 LAB — RPR: RPR Ser Ql: NONREACTIVE

## 2018-11-03 MED ORDER — POTASSIUM CHLORIDE IN NACL 20-0.45 MEQ/L-% IV SOLN
INTRAVENOUS | Status: DC
  Administered 2018-11-03 – 2018-11-05 (×5): via INTRAVENOUS
  Administered 2018-11-06: 75 mL/h via INTRAVENOUS
  Administered 2018-11-07: 02:00:00 via INTRAVENOUS
  Filled 2018-11-03 (×10): qty 1000

## 2018-11-03 MED ORDER — SODIUM CHLORIDE 0.45 % IV SOLN: INTRAVENOUS | Status: DC

## 2018-11-03 NOTE — Procedures (Signed)
History: 77 year old female being evaluated for encephalopathy  Sedation: None  Technique: This is a 21 channel routine scalp EEG performed at the bedside with bipolar and monopolar montages arranged in accordance to the international 10/20 system of electrode placement. One channel was dedicated to EKG recording.    Background: The background consists primarily of generalized irregular delta activity with superimposed 1 to 1.5 Hz generalized periodic discharges with triphasic morphology.  There is no evolution to suggest an ictal nature.  There is no clear posterior dominant rhythm.  Photic stimulation: Physiologic driving is not performed  EEG Abnormalities: 1) triphasic waves 2) generalized irregular slow activity 3) absent PDR  Clinical Interpretation: This EEG is most consistent with a generalized nonspecific cerebral dysfunction (encephalopathy).    Though typically more associated with toxic/metabolic encephalopathy, triphasic waves can rarely be associated with other disorders including hypoxic-brain injury, epileptic encephalopathy(e.g. Non-convulsive status epilepticus).   There was no definite seizure recorded.   Christine SlotMcNeill Mikiala Fugett, MD [facility] [phone]  If 7pm- 7am, please page neurology on call as listed in [facility].

## 2018-11-03 NOTE — Progress Notes (Signed)
Unable to obtain blood pressure due to patient fighting.

## 2018-11-03 NOTE — Progress Notes (Signed)
Subjective: She is overall about the same.  She is not responsive but will turn her head toward me.  She has unusual eye movements that I do not think her diagnostic of ocular bobbing at this point.  Objective: Vital signs in last 24 hours: Temp:  [98 F (36.7 C)-99 F (37.2 C)] 98 F (36.7 C) (11/30 5409) Pulse Rate:  [76-88] 88 (11/30 0637) Resp:  [20] 20 (11/29 1350) BP: (142-185)/(85-117) 161/98 (11/30 0637) SpO2:  [93 %-100 %] 98 % (11/30 0637) Weight:  [80.7 kg-81.1 kg] 80.7 kg (11/30 0435) Weight change: -0.842 kg Last BM Date: (UTA)  Intake/Output from previous day: 11/29 0701 - 11/30 0700 In: 4894.6 [I.V.:561.3; IV Piggyback:4333.3] Out: 2750 [Urine:2750]  PHYSICAL EXAM General appearance: Unresponsive but will turn toward me Resp: clear to auscultation bilaterally Cardio: irregularly irregular rhythm GI: soft, non-tender; bowel sounds normal; no masses,  no organomegaly Extremities: extremities normal, atraumatic, no cyanosis or edema  Lab Results:  Results for orders placed or performed during the hospital encounter of 11/01/18 (from the past 48 hour(s))  CBG monitoring, ED     Status: Abnormal   Collection Time: 11/01/18  8:58 PM  Result Value Ref Range   Glucose-Capillary 107 (H) 70 - 99 mg/dL  Urinalysis, Routine w reflex microscopic     Status: Abnormal   Collection Time: 11/01/18  9:01 PM  Result Value Ref Range   Color, Urine YELLOW YELLOW   APPearance HAZY (A) CLEAR   Specific Gravity, Urine 1.016 1.005 - 1.030   pH 5.0 5.0 - 8.0   Glucose, UA NEGATIVE NEGATIVE mg/dL   Hgb urine dipstick NEGATIVE NEGATIVE   Bilirubin Urine NEGATIVE NEGATIVE   Ketones, ur NEGATIVE NEGATIVE mg/dL   Protein, ur 30 (A) NEGATIVE mg/dL   Nitrite NEGATIVE NEGATIVE   Leukocytes, UA NEGATIVE NEGATIVE   RBC / HPF 0-5 0 - 5 RBC/hpf   WBC, UA 0-5 0 - 5 WBC/hpf   Bacteria, UA NONE SEEN NONE SEEN   Mucus PRESENT    Hyaline Casts, UA PRESENT     Comment: Performed at [facility], [address] [place] [address], [place], [place] [address]  Urine culture     Status: None   Collection Time: 11/01/18  9:01 PM  Result Value Ref Range   Specimen Description      URINE, RANDOM Performed at [facility], [address] [place], [place] [address]    Special Requests      NONE Performed at [facility], [address] [place], [place] [address]    Culture      NO GROWTH Performed at [facility], [address] [place], [place] [address]    Report Status 11/03/2018 FINAL   Comprehensive metabolic panel     Status: Abnormal   Collection Time: 11/01/18 10:50 PM  Result Value Ref Range   Sodium 139 135 - 145 mmol/L   Potassium 3.8 3.5 - 5.1 mmol/L   Chloride 110 98 - 111 mmol/L   CO2 22 22 - 32 mmol/L   Glucose, Bld 100 (H) 70 - 99 mg/dL   BUN 30 (H) 8 - 23 mg/dL   Creatinine, Ser 8.46 (H) 0.44 - 1.00 mg/dL   Calcium 9.7 8.9 - 96.2 mg/dL   Total Protein 7.3 6.5 - 8.1 g/dL   Albumin 3.5 3.5 - 5.0 g/dL   AST 16 15 - 41 U/L   ALT 10 0 - 44 U/L   Alkaline Phosphatase 62 38 - 126 U/L  Total Bilirubin 0.7 0.3 - 1.2 mg/dL   GFR calc non Af Amer 44 (L) >60 mL/min   GFR calc Af Amer 51 (L) >60 mL/min   Anion gap 7 5 - 15    Comment: Performed at [facility], [address], [place], [place] [address]  Troponin I - Once     Status: None   Collection Time: 11/01/18 10:50 PM  Result Value Ref Range   Troponin I <0.03 <0.03 ng/mL    Comment: Performed at [facility], [address] [place], [place] [address]  Lactic acid, plasma     Status: None   Collection Time: 11/01/18 10:50 PM  Result Value Ref Range   Lactic Acid, Venous 0.7 0.5 - 1.9 mmol/L    Comment: Performed at [facility], [address], [place], [place] [address]  CBC with Differential     Status: None   Collection Time: 11/01/18 10:50 PM  Result Value Ref Range   WBC 6.8 4.0 - 10.5 K/uL   RBC 4.40 3.87 - 5.11 MIL/uL   Hemoglobin 12.1 12.0 - 15.0 g/dL   HCT 65.7 84.6 - 96.2 %   MCV 90.5 80.0 - 100.0  fL   MCH 27.5 26.0 - 34.0 pg   MCHC 30.4 30.0 - 36.0 g/dL   RDW 95.2 84.1 - 32.4 %   Platelets 169 150 - 400 K/uL   nRBC 0.0 0.0 - 0.2 %   Neutrophils Relative % 64 %   Neutro Abs 4.3 1.7 - 7.7 K/uL   Lymphocytes Relative 24 %   Lymphs Abs 1.6 0.7 - 4.0 K/uL   Monocytes Relative 8 %   Monocytes Absolute 0.5 0.1 - 1.0 K/uL   Eosinophils Relative 3 %   Eosinophils Absolute 0.2 0.0 - 0.5 K/uL   Basophils Relative 1 %   Basophils Absolute 0.0 0.0 - 0.1 K/uL   Immature Granulocytes 0 %   Abs Immature Granulocytes 0.03 0.00 - 0.07 K/uL    Comment: Performed at [facility], [address] [place], [place] [address]  Protime-INR     Status: Abnormal   Collection Time: 11/01/18 10:50 PM  Result Value Ref Range   Prothrombin Time 19.4 (H) 11.4 - 15.2 seconds   INR 1.66     Comment: Performed at [facility], [address], [place], [place] [address]  TSH     Status: None   Collection Time: 11/01/18 10:50 PM  Result Value Ref Range   TSH 1.404 0.350 - 4.500 uIU/mL    Comment: Performed by a 3rd Generation assay with a functional sensitivity of <=0.01 uIU/mL. Performed at [facility], [address] [place], [place] [address]   CBG monitoring, ED     Status: None   Collection Time: 11/02/18 12:38 AM  Result Value Ref Range   Glucose-Capillary 94 70 - 99 mg/dL  Blood gas, arterial     Status: Abnormal   Collection Time: 11/02/18 12:45 AM  Result Value Ref Range   FIO2 21.00    pH, Arterial 7.310 (L) 7.350 - 7.450   pCO2 arterial 47.3 32.0 - 48.0 mmHg   pO2, Arterial 75.6 (L) 83.0 - 108.0 mmHg   Bicarbonate 22.0 20.0 - 28.0 mmol/L   Acid-base deficit 2.2 (H) 0.0 - 2.0 mmol/L   O2 Saturation 93.5 %   Patient temperature 37.0    Collection site LEFT RADIAL    Drawn by [id number]    Sample type ARTERIAL DRAW    Allens test (pass/fail)  PASS PASS    Comment: Performed at [facility], [address] [place], [place] [address]  Ammonia     Status: None   Collection Time: 11/02/18 12:55  AM  Result Value Ref Range   Ammonia 21 9 - 35 umol/L    Comment: Performed at [facility], [address] [place], [place] [address]  Vitamin B12     Status: None   Collection Time: 11/02/18 12:55 AM  Result Value Ref Range   Vitamin B-12 414 180 - 914 pg/mL    Comment: (NOTE) This assay is not validated for testing neonatal or myeloproliferative syndrome specimens for Vitamin B12 levels. Performed at [facility], [address] [place], [place] [address]   RPR     Status: None   Collection Time: 11/02/18 12:57 AM  Result Value Ref Range   RPR Ser Ql Non Reactive Non Reactive    Comment: (NOTE) Performed At: [facility] [address] [place], [place] [id number] Jolene SchimkeNagendra Sanjai MD NA:[id number]:[phone]   CK     Status: Abnormal   Collection Time: 11/02/18 12:57 AM  Result Value Ref Range   Total CK 32 (L) 38 - 234 U/L    Comment: Performed at [facility], [address] [place], [place] [address]  HIV Antibody (routine testing w rflx)     Status: None   Collection Time: 11/02/18 12:57 AM  Result Value Ref Range   HIV Screen 4th Generation wRfx Non Reactive Non Reactive    Comment: (NOTE) Performed At: [facility] [address] [place], [place] [id number] Jolene SchimkeNagendra Sanjai MD VV:[id number]:[phone]   MRSA PCR Screening     Status: Abnormal   Collection Time: 11/02/18  1:51 AM  Result Value Ref Range   MRSA by PCR POSITIVE (A) NEGATIVE    Comment:        The GeneXpert MRSA Assay (FDA approved for NASAL specimens only), is one component of a comprehensive MRSA colonization surveillance program. It is not intended to diagnose MRSA infection nor to guide or monitor treatment for MRSA infections. RESULT CALLED TO, READ BACK BY AND VERIFIED WITH: FOLEY,B AT 0840 BY HUFFINES,S ON 11/02/18. Performed at [facility], [address] [place], [place] [address]   Basic metabolic panel     Status: Abnormal   Collection Time: 11/02/18  6:30 AM  Result Value Ref Range    Sodium 141 135 - 145 mmol/L   Potassium 4.5 3.5 - 5.1 mmol/L   Chloride 109 98 - 111 mmol/L   CO2 22 22 - 32 mmol/L   Glucose, Bld 100 (H) 70 - 99 mg/dL   BUN 26 (H) 8 - 23 mg/dL   Creatinine, Ser 5.461.21 (H) 0.44 - 1.00 mg/dL   Calcium 27.010.2 8.9 - 35.010.3 mg/dL   GFR calc non Af Amer 43 (L) >60 mL/min   GFR calc Af Amer 49 (L) >60 mL/min   Anion gap 10 5 - 15    Comment: Performed at [facility], [address] [place], [place] [address]  CBC with Differential/Platelet     Status: Abnormal   Collection Time: 11/02/18  6:30 AM  Result Value Ref Range   WBC 6.2 4.0 - 10.5 K/uL   RBC 5.22 (H) 3.87 - 5.11 MIL/uL   Hemoglobin 14.3 12.0 - 15.0 g/dL   HCT 82.947.2 (H) 93.736.0 - 16.946.0 %   MCV 90.4 80.0 - 100.0 fL   MCH 27.4 26.0 - 34.0 pg   MCHC 30.3 30.0 - 36.0  g/dL   RDW 91.4 78.2 - 95.6 %   Platelets 164 150 - 400 K/uL   nRBC 0.0 0.0 - 0.2 %   Neutrophils Relative % 68 %   Neutro Abs 4.2 1.7 - 7.7 K/uL   Lymphocytes Relative 21 %   Lymphs Abs 1.3 0.7 - 4.0 K/uL   Monocytes Relative 7 %   Monocytes Absolute 0.4 0.1 - 1.0 K/uL   Eosinophils Relative 3 %   Eosinophils Absolute 0.2 0.0 - 0.5 K/uL   Basophils Relative 1 %   Basophils Absolute 0.0 0.0 - 0.1 K/uL   Immature Granulocytes 0 %   Abs Immature Granulocytes 0.02 0.00 - 0.07 K/uL    Comment: Performed at [facility], [address] [place], [place] [address]  Glucose, capillary     Status: None   Collection Time: 11/02/18  7:41 AM  Result Value Ref Range   Glucose-Capillary 91 70 - 99 mg/dL  Glucose, capillary     Status: None   Collection Time: 11/02/18 11:26 AM  Result Value Ref Range   Glucose-Capillary 83 70 - 99 mg/dL  Glucose, capillary     Status: None   Collection Time: 11/02/18  4:44 PM  Result Value Ref Range   Glucose-Capillary 96 70 - 99 mg/dL  Glucose, capillary     Status: None   Collection Time: 11/02/18 10:10 PM  Result Value Ref Range   Glucose-Capillary 93 70 - 99 mg/dL   Comment 1 Notify RN    Comment 2  Document in Chart   Glucose, capillary     Status: Abnormal   Collection Time: 11/03/18  7:34 AM  Result Value Ref Range   Glucose-Capillary 122 (H) 70 - 99 mg/dL   Comment 1 Notify RN    Comment 2 Document in Chart   CBC with Differential/Platelet     Status: None   Collection Time: 11/03/18  8:59 AM  Result Value Ref Range   WBC 9.3 4.0 - 10.5 K/uL   RBC 4.92 3.87 - 5.11 MIL/uL   Hemoglobin 13.4 12.0 - 15.0 g/dL   HCT 65.7 84.6 - 96.2 %   MCV 87.8 80.0 - 100.0 fL   MCH 27.2 26.0 - 34.0 pg   MCHC 31.0 30.0 - 36.0 g/dL   RDW 95.2 84.1 - 32.4 %   Platelets 169 150 - 400 K/uL   nRBC 0.0 0.0 - 0.2 %   Neutrophils Relative % 80 %   Neutro Abs 7.4 1.7 - 7.7 K/uL   Lymphocytes Relative 10 %   Lymphs Abs 1.0 0.7 - 4.0 K/uL   Monocytes Relative 7 %   Monocytes Absolute 0.7 0.1 - 1.0 K/uL   Eosinophils Relative 3 %   Eosinophils Absolute 0.3 0.0 - 0.5 K/uL   Basophils Relative 0 %   Basophils Absolute 0.0 0.0 - 0.1 K/uL   Immature Granulocytes 0 %   Abs Immature Granulocytes 0.03 0.00 - 0.07 K/uL    Comment: Performed at [facility], [address], [place], [place] [address]    ABGS Recent Labs    11/02/18 0045  PHART 7.310*  PO2ART 75.6*  HCO3 22.0   CULTURES Recent Results (from the past 240 hour(s))  Urine culture     Status: None   Collection Time: 11/01/18  9:01 PM  Result Value Ref Range Status   Specimen Description   Final    URINE, RANDOM Performed at [facility], [address], [place], [place] [address]  Special Requests   Final    NONE Performed at [facility], [address] [place], [place] [address]    Culture   Final    NO GROWTH Performed at [facility], [address] [place], [place] [address]    Report Status 11/03/2018 FINAL  Final  MRSA PCR Screening     Status: Abnormal   Collection Time: 11/02/18  1:51 AM  Result Value Ref Range Status   MRSA by PCR POSITIVE (A) NEGATIVE Final    Comment:        The GeneXpert MRSA Assay  (FDA approved for NASAL specimens only), is one component of a comprehensive MRSA colonization surveillance program. It is not intended to diagnose MRSA infection nor to guide or monitor treatment for MRSA infections. RESULT CALLED TO, READ BACK BY AND VERIFIED WITH: FOLEY,B AT 0840 BY HUFFINES,S ON 11/02/18. Performed at [facility], [address], [place], [place] [address]    Studies/Results: Dg Chest 1 View  Result Date: 11/01/2018 CLINICAL DATA:  Altered mental status EXAM: CHEST  1 VIEW COMPARISON:  10/18/2017 FINDINGS: Left-sided central venous port tip over the SVC. Right-sided pacing device as before. Cardiomegaly without acute opacity or pleural effusion. No pneumothorax. IMPRESSION: No active disease.  Cardiomegaly Electronically Signed   By: Jasmine Pang M.D.   On: 11/01/2018 21:30   Ct Head Wo Contrast  Result Date: 11/01/2018 CLINICAL DATA:  Initial evaluation for acute lethargy, confusion, decreased level of consciousness. EXAM: CT HEAD WITHOUT CONTRAST TECHNIQUE: Contiguous axial images were obtained from the base of the skull through the vertex without intravenous contrast. COMPARISON:  Prior CT from 09/26/2017. FINDINGS: Brain: Generalized age-related cerebral atrophy with moderate to advanced chronic microvascular ischemic disease, mildly progressed relative to 2018. No acute intracranial hemorrhage. No acute large vessel territory infarct. No mass lesion, midline shift or mass effect. No hydrocephalus. No extra-axial fluid collection. Vascular: No hyperdense vessel. Calcified atherosclerosis at the skull base. Skull: Scalp soft tissues and calvarium demonstrate no acute abnormality. Sinuses/Orbits: Globes and orbital soft tissues within normal limits. Paranasal sinuses and mastoid air cells are clear. Other: None. IMPRESSION: 1. No acute intracranial abnormality. 2. Generalized age-related cerebral atrophy with moderate to advanced chronic microvascular ischemic changes,  mildly progressed relative to 2018. Electronically Signed   By: Rise Mu M.D.   On: 11/01/2018 22:07    Medications:  Prior to Admission:  Medications Prior to Admission  Medication Sig Dispense Refill Last Dose  . acetaminophen (TYLENOL) 325 MG tablet Take 325-650 mg by mouth every 6 (six) hours as needed for mild pain, moderate pain or headache.    10/18/2017 at Unknown time  . amLODipine (NORVASC) 10 MG tablet Take 1 tablet (10 mg total) daily by mouth.     Marland Kitchen apixaban (ELIQUIS) 5 MG TABS tablet Take 5 mg by mouth 2 (two) times daily.    10/18/2017 at 600  . aspirin EC 81 MG tablet Take 81 mg by mouth daily.   10/18/2017 at 600  . atorvastatin (LIPITOR) 40 MG tablet Take 40 mg by mouth at bedtime.   10/17/2017 at Unknown time  . ferrous sulfate 325 (65 FE) MG tablet Take 325 mg by mouth daily with breakfast.   10/18/2017 at Unknown time  . furosemide (LASIX) 40 MG tablet Take 40 mg by mouth daily as needed for fluid.   10/18/2017 at Unknown time  . gabapentin (NEURONTIN) 100 MG capsule Take 200 mg by mouth at bedtime.   10/17/2017  at Unknown time  . ibuprofen (ADVIL,MOTRIN) 200 MG tablet Take 600 mg by mouth every 6 (six) hours as needed for headache, mild pain or moderate pain.   Past Month at Unknown time  . levETIRAcetam (KEPPRA) 500 MG tablet Take 500 mg by mouth 2 (two) times daily.   10/18/2017 at Unknown time  . lubiprostone (AMITIZA) 24 MCG capsule Take 24 mcg by mouth 2 (two) times daily with a meal.   10/18/2017 at Unknown time  . Melatonin 3 MG TABS Take 3 mg by mouth at bedtime as needed (for sleep).   10/17/2017 at Unknown time  . metFORMIN (GLUCOPHAGE) 500 MG tablet Take 500 mg by mouth 2 (two) times daily with a meal.    10/18/2017 at Unknown time  . metoprolol tartrate (LOPRESSOR) 25 MG tablet Take 12.5 mg by mouth 2 (two) times daily.    10/18/2017 at 600  . nitroGLYCERIN (NITROSTAT) 0.4 MG SL tablet Place 1 tablet (0.4 mg total) under the tongue every 5 (five)  minutes as needed for chest pain. 25 tablet 1 unknown  . omega-3 acid ethyl esters (LOVAZA) 1 G capsule Take 2 g by mouth 2 (two) times daily.    10/18/2017 at Unknown time  . ondansetron (ZOFRAN-ODT) 4 MG disintegrating tablet Take 4 mg by mouth every 8 (eight) hours as needed for nausea or vomiting.   Past Month at Unknown time  . pantoprazole (PROTONIX) 40 MG tablet Take 1 tablet (40 mg total) by mouth daily. 30 tablet 3 10/18/2017 at Unknown time  . potassium chloride (K-DUR,KLOR-CON) 10 MEQ tablet Take 10 mEq by mouth daily.   10/18/2017 at Unknown time  . senna-docusate (SENEXON-S) 8.6-50 MG tablet Take 1 tablet by mouth 2 (two) times daily.   10/18/2017 at Unknown time  . traZODone (DESYREL) 50 MG tablet Take 50 mg by mouth at bedtime.   10/17/2017 at Unknown time   Scheduled: . aspirin EC  81 mg Oral Daily  . atorvastatin  40 mg Oral q1800  . enoxaparin (LOVENOX) injection  1 mg/kg Subcutaneous Q12H  . gabapentin  200 mg Oral QHS  . insulin aspart  0-5 Units Subcutaneous QHS  . insulin aspart  0-9 Units Subcutaneous TID WC  . lubiprostone  24 mcg Oral BID WC  . omega-3 acid ethyl esters  2 g Oral BID  . pantoprazole  40 mg Oral Daily  . senna-docusate  1 tablet Oral BID  . sodium chloride flush  3 mL Intravenous Q12H   Continuous: . levETIRAcetam Stopped (11/02/18 2300)   WUJ:[id number] **OR** acetaminophen, hydrALAZINE, Melatonin, ondansetron **OR** ondansetron (ZOFRAN) IV, polyethylene glycol  Assesment: She was admitted with acute encephalopathy.  This may be multifactorial.  She is had trouble with encephalopathy after receiving medications on multiple occasions in the past but I do think she should be past the point of having medication effect at this time.  She has seizure disorder but no witnessed seizures.  She is on IV Keppra.  She had a EEG done which is pending  She is unresponsive and has abnormal eye movements not clearly ocular bobbing but I am concerned that  she may have had a brainstem stroke Principal Problem:   Acute encephalopathy Active Problems:   Chronic diastolic heart failure (HCC)   Diabetes mellitus, type II (HCC)   Seizure disorder (HCC)   Atrial fibrillation (HCC)   CKD (chronic kidney disease), stage III (HCC)   Altered mental status    Plan: Continue treatments.  Discussed  CODE STATUS with family.  She was listed as full code but they do not want her to be resuscitated.  She may need comfort care but looks comfortable now    LOS: 1 day   Kosta Schnitzler L 11/03/2018, 10:14 AM

## 2018-11-04 LAB — GLUCOSE, CAPILLARY
Glucose-Capillary: 111 mg/dL — ABNORMAL HIGH (ref 70–99)
Glucose-Capillary: 113 mg/dL — ABNORMAL HIGH (ref 70–99)
Glucose-Capillary: 102 mg/dL — ABNORMAL HIGH (ref 70–99)
Glucose-Capillary: 95 mg/dL (ref 70–99)

## 2018-11-04 LAB — FOLATE RBC
FOLATE, RBC: 1121 ng/mL (ref >498)
Folate, Hemolysate: 406.8 ng/mL
Hematocrit: 36.3 % (ref 34.0–46.6)

## 2018-11-04 NOTE — Progress Notes (Signed)
Pt is more alert this morning. She is smiling, laughing, was able to tell me her name and stated she wanted something to eat and drink. Pt has been NPO due to decreased mental status. RN provided apple juice first, and patient drunk approximately 240 mL. Oatmeal was then provided and patient took a few bites and didn't want anymore. Will make MD aware and continue to monitor.

## 2018-11-04 NOTE — Progress Notes (Signed)
Subjective: She is much improved.  She is more alert.  He is still confused.  She says she is hungry.  Objective: Vital signs in last 24 hours: Temp:  [97.9 F (36.6 C)] 97.9 F (36.6 C) (12/01 0500) Pulse Rate:  [94] 94 (12/01 0500) Resp:  [20] 20 (12/01 0500) BP: (171)/(85) 171/85 (12/01 0500) SpO2:  [98 %] 98 % (12/01 0500) Weight:  [80 kg] 80 kg (12/01 0408) Weight change: -1.058 kg Last BM Date: 11/03/18  Intake/Output from previous day: 11/30 0701 - 12/01 0700 In: 3243.8 [I.V.:823.8; IV Piggyback:2420] Out: 1450 [Urine:1450]  PHYSICAL EXAM General appearance: alert and no distress Resp: clear to auscultation bilaterally Cardio: irregularly irregular rhythm GI: soft, non-tender; bowel sounds normal; no masses,  no organomegaly Extremities: extremities normal, atraumatic, no cyanosis or edema  Lab Results:  Results for orders placed or performed during the hospital encounter of 11/01/18 (from the past 48 hour(s))  Glucose, capillary     Status: None   Collection Time: 11/02/18 11:26 AM  Result Value Ref Range   Glucose-Capillary 83 70 - 99 mg/dL  Glucose, capillary     Status: None   Collection Time: 11/02/18  4:44 PM  Result Value Ref Range   Glucose-Capillary 96 70 - 99 mg/dL  Glucose, capillary     Status: None   Collection Time: 11/02/18 10:10 PM  Result Value Ref Range   Glucose-Capillary 93 70 - 99 mg/dL   Comment 1 Notify RN    Comment 2 Document in Chart   Glucose, capillary     Status: Abnormal   Collection Time: 11/03/18  7:34 AM  Result Value Ref Range   Glucose-Capillary 122 (H) 70 - 99 mg/dL   Comment 1 Notify RN    Comment 2 Document in Chart   CBC with Differential/Platelet     Status: None   Collection Time: 11/03/18  8:59 AM  Result Value Ref Range   WBC 9.3 4.0 - 10.5 K/uL   RBC 4.92 3.87 - 5.11 MIL/uL   Hemoglobin 13.4 12.0 - 15.0 g/dL   HCT 40.943.2 81.136.0 - 91.446.0 %   MCV 87.8 80.0 - 100.0 fL   MCH 27.2 26.0 - 34.0 pg   MCHC 31.0 30.0 -  36.0 g/dL   RDW 78.215.1 95.611.5 - 21.315.5 %   Platelets 169 150 - 400 K/uL   nRBC 0.0 0.0 - 0.2 %   Neutrophils Relative % 80 %   Neutro Abs 7.4 1.7 - 7.7 K/uL   Lymphocytes Relative 10 %   Lymphs Abs 1.0 0.7 - 4.0 K/uL   Monocytes Relative 7 %   Monocytes Absolute 0.7 0.1 - 1.0 K/uL   Eosinophils Relative 3 %   Eosinophils Absolute 0.3 0.0 - 0.5 K/uL   Basophils Relative 0 %   Basophils Absolute 0.0 0.0 - 0.1 K/uL   Immature Granulocytes 0 %   Abs Immature Granulocytes 0.03 0.00 - 0.07 K/uL    Comment: Performed at [facility], [address] [place], [place] [address]  Comprehensive metabolic panel     Status: Abnormal   Collection Time: 11/03/18  8:59 AM  Result Value Ref Range   Sodium 137 135 - 145 mmol/L   Potassium 3.6 3.5 - 5.1 mmol/L    Comment: DELTA CHECK NOTED   Chloride 107 98 - 111 mmol/L   CO2 19 (L) 22 - 32 mmol/L   Glucose, Bld 110 (H) 70 - 99 mg/dL   BUN 17 8 - 23 mg/dL  Creatinine, Ser 1.03 (H) 0.44 - 1.00 mg/dL   Calcium 9.9 8.9 - 60.4 mg/dL   Total Protein 8.4 (H) 6.5 - 8.1 g/dL   Albumin 3.9 3.5 - 5.0 g/dL   AST 24 15 - 41 U/L   ALT 11 0 - 44 U/L   Alkaline Phosphatase 68 38 - 126 U/L   Total Bilirubin 1.3 (H) 0.3 - 1.2 mg/dL   GFR calc non Af Amer 52 (L) >60 mL/min   GFR calc Af Amer 60 (L) >60 mL/min   Anion gap 11 5 - 15    Comment: Performed at [facility], [address] [place], [place] [address]  Glucose, capillary     Status: Abnormal   Collection Time: 11/03/18  4:13 PM  Result Value Ref Range   Glucose-Capillary 126 (H) 70 - 99 mg/dL   Comment 1 Notify RN    Comment 2 Document in Chart   Glucose, capillary     Status: Abnormal   Collection Time: 11/03/18  8:35 PM  Result Value Ref Range   Glucose-Capillary 112 (H) 70 - 99 mg/dL   Comment 1 Notify RN    Comment 2 Document in Chart   Glucose, capillary     Status: Abnormal   Collection Time: 11/04/18  7:25 AM  Result Value Ref Range   Glucose-Capillary 111 (H) 70 - 99 mg/dL     ABGS Recent Labs    11/02/18 0045  PHART 7.310*  PO2ART 75.6*  HCO3 22.0   CULTURES Recent Results (from the past 240 hour(s))  Urine culture     Status: None   Collection Time: 11/01/18  9:01 PM  Result Value Ref Range Status   Specimen Description   Final    URINE, RANDOM Performed at [facility], [address] [place], [place] [address]    Special Requests   Final    NONE Performed at [facility], [address], [place], [place] [address]    Culture   Final    NO GROWTH Performed at [facility], [address] [place], [place] [address]    Report Status 11/03/2018 FINAL  Final  MRSA PCR Screening     Status: Abnormal   Collection Time: 11/02/18  1:51 AM  Result Value Ref Range Status   MRSA by PCR POSITIVE (A) NEGATIVE Final    Comment:        The GeneXpert MRSA Assay (FDA approved for NASAL specimens only), is one component of a comprehensive MRSA colonization surveillance program. It is not intended to diagnose MRSA infection nor to guide or monitor treatment for MRSA infections. RESULT CALLED TO, READ BACK BY AND VERIFIED WITH: FOLEY,B AT 0840 BY HUFFINES,S ON 11/02/18. Performed at [facility], [address] [place], [place] [address]    Studies/Results: No results found.  Medications:  Prior to Admission:  Medications Prior to Admission  Medication Sig Dispense Refill Last Dose  . acetaminophen (TYLENOL) 325 MG tablet Take 650 mg by mouth every 4 (four) hours as needed for mild pain, fever or headache.   10/26/2018  . apixaban (ELIQUIS) 5 MG TABS tablet Take 5 mg by mouth 2 (two) times daily.    11/02/2018 at Unknown time  . atorvastatin (LIPITOR) 10 MG tablet Take 10 mg by mouth at bedtime.    Past Week at Unknown time  . baclofen (LIORESAL) 10 MG tablet Take 5 mg by mouth at bedtime.   Past Week at Unknown time  . famotidine (  PEPCID) 20 MG tablet Take 20 mg by mouth daily.   11/02/2018 at Unknown time  . ferrous sulfate 325 (65 FE)  MG tablet Take 325 mg by mouth daily with breakfast.   11/02/2018 at Unknown time  . gabapentin (NEURONTIN) 100 MG capsule Take 200 mg by mouth at bedtime.   11/02/2018 at Unknown time  . levETIRAcetam (KEPPRA) 500 MG tablet Take 500 mg by mouth 2 (two) times daily.   11/02/2018 at Unknown time  . linaclotide (LINZESS) 145 MCG CAPS capsule Take 145 mcg by mouth daily before breakfast.   11/02/2018 at Unknown time  . Melatonin 5 MG TABS Take 5 mg by mouth at bedtime as needed (for sleep).    Past Week at Unknown time  . metoprolol tartrate (LOPRESSOR) 25 MG tablet Take 12.5 mg by mouth 2 (two) times daily.    11/02/2018 at 1700  . nitroGLYCERIN (NITRODUR - DOSED IN MG/24 HR) 0.4 mg/hr patch Place 0.4 mg onto the skin as needed. Give 1 tablet sublingually every 5 minutes as needed fo chest pain   unknown  . potassium chloride (K-DUR,KLOR-CON) 10 MEQ tablet Take 10 mEq by mouth daily.   11/02/2018 at Unknown time  . senna-docusate (SENEXON-S) 8.6-50 MG tablet Take 1 tablet by mouth 2 (two) times daily.   11/02/2018 at Unknown time  . sertraline (ZOLOFT) 50 MG tablet Take 50 mg by mouth daily.   11/02/2018 at Unknown time  . traMADol (ULTRAM) 50 MG tablet Take 50 mg by mouth every 4 (four) hours as needed for moderate pain. Give 1 tablet by mouth every 4 hours as needed for pain   unknown  . traZODone (DESYREL) 50 MG tablet Take 50 mg by mouth at bedtime.   Past Week at Unknown time  . Vitamin D, Ergocalciferol, (DRISDOL) 1.25 MG (50000 UT) CAPS capsule Take 50,000 Units by mouth every 7 (seven) days.   10/31/2018   Scheduled: . aspirin EC  81 mg Oral Daily  . atorvastatin  40 mg Oral q1800  . enoxaparin (LOVENOX) injection  1 mg/kg Subcutaneous Q12H  . gabapentin  200 mg Oral QHS  . insulin aspart  0-5 Units Subcutaneous QHS  . insulin aspart  0-9 Units Subcutaneous TID WC  . lubiprostone  24 mcg Oral BID WC  . omega-3 acid ethyl esters  2 g Oral BID  . pantoprazole  40 mg Oral Daily  .  senna-docusate  1 tablet Oral BID  . sodium chloride flush  3 mL Intravenous Q12H   Continuous: . 0.45 % NaCl with KCl 20 mEq / L 75 mL/hr at 11/04/18 0410  . levETIRAcetam Stopped (11/03/18 2306)   ZOX:[id number] **OR** acetaminophen, hydrALAZINE, Melatonin, ondansetron **OR** ondansetron (ZOFRAN) IV, polyethylene glycol  Assesment: She was admitted with acute encephalopathy likely multifactorial.  She is much better.  She is been n.p.o. because of her mental status I am going to put her on clear liquids.  Continue with IV medications mostly and she may be able to get back on more of her regular medications if she does well with swallowing Principal Problem:   Acute encephalopathy Active Problems:   Chronic diastolic heart failure (HCC)   Diabetes mellitus, type II (HCC)   Seizure disorder (HCC)   Atrial fibrillation (HCC)   CKD (chronic kidney disease), stage III (HCC)   Altered mental status    Plan: As above    LOS: 2 days   Lionel Woodberry L 11/04/2018, 10:29 AM

## 2018-11-05 LAB — GLUCOSE, CAPILLARY
Glucose-Capillary: 103 mg/dL — ABNORMAL HIGH (ref 70–99)
Glucose-Capillary: 93 mg/dL (ref 70–99)
Glucose-Capillary: 95 mg/dL (ref 70–99)
Glucose-Capillary: 116 mg/dL — ABNORMAL HIGH (ref 70–99)

## 2018-11-05 MED ORDER — MUPIROCIN 2 % EX OINT
1.0000 "application " | TOPICAL_OINTMENT | Freq: Two times a day (BID) | CUTANEOUS | Status: DC
  Administered 2018-11-05 – 2018-11-07 (×5): 1 via NASAL
  Filled 2018-11-05 (×2): qty 22

## 2018-11-05 MED ORDER — CHLORHEXIDINE GLUCONATE CLOTH 2 % EX PADS
6.0000 | MEDICATED_PAD | Freq: Every day | CUTANEOUS | Status: DC
  Administered 2018-11-05 – 2018-11-07 (×3): 6 via TOPICAL

## 2018-11-05 NOTE — Evaluation (Signed)
Clinical/Bedside Swallow Evaluation Patient Details  Name: MILLY GOGGINS MRN: [id number] Date of Birth: [birth year]  Today's Date: 11/05/2018 Time: SLP Start Time (ACUTE ONLY): 1303 SLP Stop Time (ACUTE ONLY): 1343 SLP Time Calculation (min) (ACUTE ONLY): 40 min  Past Medical History:  Past Medical History:  Diagnosis Date  . Anemia   . Anginal pain (HCC)    "all the time, but not heart related"  . Arthritis    "legs and back" (09/30/2013)  . Asthma    years ago  . Atrial fibrillation (HCC)   . 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 Blurred vision, bilateral    sometimes double vision  . CAD (coronary artery disease)    multivessel, CABG 6/10.  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  Myoview (11/20/11) showed no ischemia & EF 62%  . Chronic lower back pain   . Confusion   . COPD (chronic obstructive pulmonary disease) (HCC)    severe lung disease by PFTs 6/12  . DDD (degenerative disc disease)   . Deep vein thrombosis, upper right extremity (HCC)    post-PPM  . Dehiscence of closure of sternum or sternotomy 07/2009   sternal infection; required flap closure  . Dementia (HCC)   . Diabetes mellitus, type II (HCC)   . Gastroesophageal reflux disease    not much  . H/O hiatal hernia   . History of kidney stones   . Hyperlipidemia   . Hypertension   . Migraines    "years ago" (09/30/2013)  . Obesity   . Orthopnea    sometimes  . Pacemaker   . PUD (peptic ulcer disease)   . Seizures (HCC)    "long time ago; don't remember what they were related to" (09/30/2013)none recent  . Sleep apnea    denies CPAP use on 09/30/2013  . 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 Urge incontinence of urine    Past Surgical History:  Past Surgical History:  Procedure Laterality Date  . ABDOMINAL HYSTERECTOMY     partial  . APPENDECTOMY    . BACK SURGERY     x3 total  . CESAREAN SECTION  ~1980  . CHOLECYSTECTOMY    . COLONOSCOPY   04/27/2005   hemorrhoids, diverticula  . COLONOSCOPY WITH ESOPHAGOGASTRODUODENOSCOPY (EGD) N/A 02/20/2013   RMR: pancolonic diverticulosis and internal hemorrhoids. EGD with small hiatal hernia, healed gastric ulcer  . CORONARY ARTERY BYPASS GRAFT  05/1999   - LIMA to LAD, SVG to OM, SVG to PDA  . ESOPHAGOGASTRODUODENOSCOPY     with [facility] dilation,  biopsy, and disruption Schatzki's ring  . ESOPHAGOGASTRODUODENOSCOPY  09/10/2012   RMR: Noncritical Schatzki's ring. Diffuse gastric erosions and an  area of partially healing ulceration-status post biopsy/ Small hiatal hernia. Bx reactive gastropathy.  . INSERT / REPLACE / REMOVE PACEMAKER  1993  . JOINT REPLACEMENT    . LUMBAR DISC SURGERY    . PACEMAKER PLACEMENT  1993   Status post DDD pacemaker implantation in 1993, with Medtronic Kappa generator change in 2003 for  second-degree AV block, most recent gen change by Fawn Kirk 04/14/11  . PORTACATH PLACEMENT Left 07/17/2013   Procedure: INSERTION PORT-A-CATH-left subclavian;  Surgeon: Fabio Bering, MD;  Location: [facility];  Service: General;  Laterality: Left;  . POSTERIOR LUMBAR FUSION    . RECONSTRUCTIVE REPAIR STERNAL     for infection s/p CABG 8/10  . REPLACEMENT TOTAL KNEE Bilateral   .  THYROIDECTOMY    . TOTAL HIP ARTHROPLASTY Right   . TOTAL HIP ARTHROPLASTY Left 12/03/2013   Procedure: LEFT TOTAL HIP ARTHROPLASTY ANTERIOR APPROACH;  Surgeon: Shelda Pal, MD;  Location: [facility];  Service: Orthopedics;  Laterality: Left;  . TUBAL LIGATION    . WRIST SURGERY Left    "tumor taken off" (09/30/2013)   HPI:  Hopie JOEANNE ROBICHEAUX is a 77 y.o. female with medical history significant for seizure disorder, atrial fibrillation on Eliquis, chronic diastolic CHF, type 2 diabetes mellitus, now presenting to the emergency department from her nursing facility for evaluation of lethargy.  She is accompanied by her daughter and granddaughter who assists with the history.  Patient seem to be in her usual state of  health 3 days ago, had not been complaining of anything, was started on baclofen, and has since become lethargic, sleeping throughout the day and remaining in bed. PCP reports Pt has had multiple episodes of AMS related to medication and it frequently takes several days to recover; Pt was noted to have been on baclofen prior to her change in mental status, PCP suspects that is the problem. Pt was made NPO secondary to lethargy and decreased alertness, she was placed on a clear liquid diet and will be progressed based on performance on ST BSE.     Assessment / Plan / Recommendation Clinical Impression  Pt was alert with granddaughter present for clincial swallowing evaluation; Pt with verbal expression characterized by nonsensical words and phonemic stuttering-like repetitions with occasional appropriate and/or accurate words intermixed. Granddaughter reports speech is typically intact (inaccurate and off-topic secondary to dementia but not characterized by nonsensical words). Pt refused all PO presented despite multiple presentations and various approaches with the exception of 4 oz of thin liquids via straw with some delayed throat clearing after swallowing. At this time recommend full liquid diet rather than clear liquid diet to provide more options and possibly increase Pt's desire for PO, however do note recommend advance to solid textures pending improvment in mentation and ability to thoroughly assess safest diet. ST will continue to follow acutely SLP Visit Diagnosis: Dysphagia, unspecified (R13.10)    Aspiration Risk  Mild aspiration risk;Moderate aspiration risk    Diet Recommendation Thin liquid   Liquid Administration via: Straw;Cup Medication Administration: Crushed with puree Supervision: Staff to assist with self feeding;Full supervision/cueing for compensatory strategies Compensations: Minimize environmental distractions;Slow rate;Small sips/bites Postural Changes: Seated upright at 90  degrees;Remain upright for at least 30 minutes after po intake    Other  Recommendations Oral Care Recommendations: Oral care BID   Follow up Recommendations Skilled Nursing facility;24 hour supervision/assistance      Frequency and Duration min 1 x/week  1 week       Prognosis Prognosis for Safe Diet Advancement: Good      Swallow Study   General Date of Onset: 11/01/18 HPI: Enda AEDYN MCKEON is a 77 y.o. female with medical history significant for seizure disorder, atrial fibrillation on Eliquis, chronic diastolic CHF, type 2 diabetes mellitus, now presenting to the emergency department from her nursing facility for evaluation of lethargy.  She is accompanied by her daughter and granddaughter who assists with the history.  Patient seem to be in her usual state of health 3 days ago, had not been complaining of anything, was started on baclofen, and has since become lethargic, sleeping throughout the day and remaining in bed. PCP reports Pt has had multiple episodes of AMS related to medication and it frequently  takes several days to recover; Pt was noted to have been on baclofen prior to her change in mental status, PCP suspects that is the problem. Pt was made NPO secondary to lethargy and decreased alertness, she was placed on a clear liquid diet and will be progressed based on performance on ST BSE.   Type of Study: Bedside Swallow Evaluation Previous Swallow Assessment: none in chart Diet Prior to this Study: Thin liquids(clear liquid diet) Temperature Spikes Noted: No Respiratory Status: Room air History of Recent Intubation: No Behavior/Cognition: Alert;Confused;Agitated;Requires cueing;Doesn't follow directions Oral Cavity Assessment: (Difficult to assess, Pt would not open) Self-Feeding Abilities: Needs assist;Total assist;Refused PO Patient Positioning: Upright in bed Baseline Vocal Quality: Normal Volitional Cough: Cognitively unable to elicit    Oral/Motor/Sensory Function  Overall Oral Motor/Sensory Function: Within functional limits   Ice Chips Ice chips: Not tested   Thin Liquid Thin Liquid: Impaired Presentation: Straw Pharyngeal  Phase Impairments: Throat Clearing - Delayed    Nectar Thick Nectar Thick Liquid: Not tested   Honey Thick Honey Thick Liquid: Not tested   Puree Puree: Not tested   Solid     Solid: Not tested     Endia Moncur H. Romie LeveeYarbrough MA, CCC-SLP Speech Language Pathologist  Georgetta HaberAmelia H Avayah Raffety 11/05/2018,1:54 PM

## 2018-11-05 NOTE — Progress Notes (Signed)
Subjective: She is awake this morning.  A little confused.  No other new complaints noted  Objective: Vital signs in last 24 hours: Temp:  [99.5 F (37.5 C)] 99.5 F (37.5 C) (12/02 0416) Pulse Rate:  [97-114] 97 (12/02 0416) Resp:  [16-18] 16 (12/02 0416) BP: (132-148)/(97-116) 143/116 (12/02 0416) SpO2:  [96 %-97 %] 97 % (12/02 0416) Weight:  [80.2 kg] 80.2 kg (12/02 0437) Weight change: 0.2 kg Last BM Date: 11/03/18  Intake/Output from previous day: 12/01 0701 - 12/02 0700 In: 540 [P.O.:440; IV Piggyback:100] Out: -   PHYSICAL EXAM General appearance: alert and mild distress Resp: clear to auscultation bilaterally Cardio: regular rate and rhythm, S1, S2 normal, no murmur, click, rub or gallop GI: soft, non-tender; bowel sounds normal; no masses,  no organomegaly Extremities: extremities normal, atraumatic, no cyanosis or edema  Lab Results:  Results for orders placed or performed during the hospital encounter of 11/01/18 (from the past 48 hour(s))  CBC with Differential/Platelet     Status: None   Collection Time: 11/03/18  8:59 AM  Result Value Ref Range   WBC 9.3 4.0 - 10.5 K/uL   RBC 4.92 3.87 - 5.11 MIL/uL   Hemoglobin 13.4 12.0 - 15.0 g/dL   HCT 16.1 09.6 - 04.5 %   MCV 87.8 80.0 - 100.0 fL   MCH 27.2 26.0 - 34.0 pg   MCHC 31.0 30.0 - 36.0 g/dL   RDW 40.9 81.1 - 91.4 %   Platelets 169 150 - 400 K/uL   nRBC 0.0 0.0 - 0.2 %   Neutrophils Relative % 80 %   Neutro Abs 7.4 1.7 - 7.7 K/uL   Lymphocytes Relative 10 %   Lymphs Abs 1.0 0.7 - 4.0 K/uL   Monocytes Relative 7 %   Monocytes Absolute 0.7 0.1 - 1.0 K/uL   Eosinophils Relative 3 %   Eosinophils Absolute 0.3 0.0 - 0.5 K/uL   Basophils Relative 0 %   Basophils Absolute 0.0 0.0 - 0.1 K/uL   Immature Granulocytes 0 %   Abs Immature Granulocytes 0.03 0.00 - 0.07 K/uL    Comment: Performed at [facility], [address] [place], [place] [address]  Comprehensive metabolic panel     Status: Abnormal   Collection Time: 11/03/18  8:59 AM  Result Value Ref Range   Sodium 137 135 - 145 mmol/L   Potassium 3.6 3.5 - 5.1 mmol/L    Comment: DELTA CHECK NOTED   Chloride 107 98 - 111 mmol/L   CO2 19 (L) 22 - 32 mmol/L   Glucose, Bld 110 (H) 70 - 99 mg/dL   BUN 17 8 - 23 mg/dL   Creatinine, Ser 6.21 (H) 0.44 - 1.00 mg/dL   Calcium 9.9 8.9 - 30.8 mg/dL   Total Protein 8.4 (H) 6.5 - 8.1 g/dL   Albumin 3.9 3.5 - 5.0 g/dL   AST 24 15 - 41 U/L   ALT 11 0 - 44 U/L   Alkaline Phosphatase 68 38 - 126 U/L   Total Bilirubin 1.3 (H) 0.3 - 1.2 mg/dL   GFR calc non Af Amer 52 (L) >60 mL/min   GFR calc Af Amer 60 (L) >60 mL/min   Anion gap 11 5 - 15    Comment: Performed at [facility], [address], [place], [place] [address]  Glucose, capillary     Status: Abnormal   Collection Time: 11/03/18  4:13 PM  Result Value Ref Range   Glucose-Capillary 126 (H) 70 -  99 mg/dL   Comment 1 Notify RN    Comment 2 Document in Chart   Glucose, capillary     Status: Abnormal   Collection Time: 11/03/18  8:35 PM  Result Value Ref Range   Glucose-Capillary 112 (H) 70 - 99 mg/dL   Comment 1 Notify RN    Comment 2 Document in Chart   Glucose, capillary     Status: Abnormal   Collection Time: 11/04/18  7:25 AM  Result Value Ref Range   Glucose-Capillary 111 (H) 70 - 99 mg/dL  Glucose, capillary     Status: Abnormal   Collection Time: 11/04/18 11:15 AM  Result Value Ref Range   Glucose-Capillary 113 (H) 70 - 99 mg/dL  Glucose, capillary     Status: Abnormal   Collection Time: 11/04/18  4:13 PM  Result Value Ref Range   Glucose-Capillary 102 (H) 70 - 99 mg/dL  Glucose, capillary     Status: None   Collection Time: 11/04/18  9:28 PM  Result Value Ref Range   Glucose-Capillary 95 70 - 99 mg/dL   Comment 1 Notify RN    Comment 2 Document in Chart   Glucose, capillary     Status: Abnormal   Collection Time: 11/05/18  7:47 AM  Result Value Ref Range   Glucose-Capillary 103 (H) 70 - 99 mg/dL   Comment 1  Notify RN     ABGS No results for input(s): PHART, PO2ART, TCO2, HCO3 in the last 72 hours.  Invalid input(s): PCO2 CULTURES Recent Results (from the past 240 hour(s))  Urine culture     Status: None   Collection Time: 11/01/18  9:01 PM  Result Value Ref Range Status   Specimen Description   Final    URINE, RANDOM Performed at [facility], [address], [place], [place] [address]    Special Requests   Final    NONE Performed at [facility], [address], [place], [place] [address]    Culture   Final    NO GROWTH Performed at [facility], [address] [place], [place] [address]    Report Status 11/03/2018 FINAL  Final  MRSA PCR Screening     Status: Abnormal   Collection Time: 11/02/18  1:51 AM  Result Value Ref Range Status   MRSA by PCR POSITIVE (A) NEGATIVE Final    Comment:        The GeneXpert MRSA Assay (FDA approved for NASAL specimens only), is one component of a comprehensive MRSA colonization surveillance program. It is not intended to diagnose MRSA infection nor to guide or monitor treatment for MRSA infections. RESULT CALLED TO, READ BACK BY AND VERIFIED WITH: FOLEY,B AT 0840 BY HUFFINES,S ON 11/02/18. Performed at [facility], [address] [place], [place] [address]    Studies/Results: No results found.  Medications:  Prior to Admission:  Medications Prior to Admission  Medication Sig Dispense Refill Last Dose  . acetaminophen (TYLENOL) 325 MG tablet Take 650 mg by mouth every 4 (four) hours as needed for mild pain, fever or headache.   10/26/2018  . apixaban (ELIQUIS) 5 MG TABS tablet Take 5 mg by mouth 2 (two) times daily.    11/02/2018 at Unknown time  . atorvastatin (LIPITOR) 10 MG tablet Take 10 mg by mouth at bedtime.    Past Week at Unknown time  . baclofen (LIORESAL) 10 MG tablet Take 5 mg by mouth at bedtime.   Past Week at Unknown time  .  famotidine (PEPCID) 20 MG tablet Take 20 mg by mouth daily.   11/02/2018 at Unknown  time  . ferrous sulfate 325 (65 FE) MG tablet Take 325 mg by mouth daily with breakfast.   11/02/2018 at Unknown time  . gabapentin (NEURONTIN) 100 MG capsule Take 200 mg by mouth at bedtime.   11/02/2018 at Unknown time  . levETIRAcetam (KEPPRA) 500 MG tablet Take 500 mg by mouth 2 (two) times daily.   11/02/2018 at Unknown time  . linaclotide (LINZESS) 145 MCG CAPS capsule Take 145 mcg by mouth daily before breakfast.   11/02/2018 at Unknown time  . Melatonin 5 MG TABS Take 5 mg by mouth at bedtime as needed (for sleep).    Past Week at Unknown time  . metoprolol tartrate (LOPRESSOR) 25 MG tablet Take 12.5 mg by mouth 2 (two) times daily.    11/02/2018 at 1700  . nitroGLYCERIN (NITRODUR - DOSED IN MG/24 HR) 0.4 mg/hr patch Place 0.4 mg onto the skin as needed. Give 1 tablet sublingually every 5 minutes as needed fo chest pain   unknown  . potassium chloride (K-DUR,KLOR-CON) 10 MEQ tablet Take 10 mEq by mouth daily.   11/02/2018 at Unknown time  . senna-docusate (SENEXON-S) 8.6-50 MG tablet Take 1 tablet by mouth 2 (two) times daily.   11/02/2018 at Unknown time  . sertraline (ZOLOFT) 50 MG tablet Take 50 mg by mouth daily.   11/02/2018 at Unknown time  . traMADol (ULTRAM) 50 MG tablet Take 50 mg by mouth every 4 (four) hours as needed for moderate pain. Give 1 tablet by mouth every 4 hours as needed for pain   unknown  . traZODone (DESYREL) 50 MG tablet Take 50 mg by mouth at bedtime.   Past Week at Unknown time  . Vitamin D, Ergocalciferol, (DRISDOL) 1.25 MG (50000 UT) CAPS capsule Take 50,000 Units by mouth every 7 (seven) days.   10/31/2018   Scheduled: . aspirin EC  81 mg Oral Daily  . atorvastatin  40 mg Oral q1800  . enoxaparin (LOVENOX) injection  1 mg/kg Subcutaneous Q12H  . gabapentin  200 mg Oral QHS  . insulin aspart  0-5 Units Subcutaneous QHS  . insulin aspart  0-9 Units Subcutaneous TID WC  . lubiprostone  24 mcg Oral BID WC  . omega-3 acid ethyl esters  2 g Oral BID  .  pantoprazole  40 mg Oral Daily  . senna-docusate  1 tablet Oral BID  . sodium chloride flush  3 mL Intravenous Q12H   Continuous: . 0.45 % NaCl with KCl 20 mEq / L 75 mL/hr at 11/04/18 1813  . levETIRAcetam Stopped (11/04/18 2329)   WUJ:[id number]:acetaminophen **OR** acetaminophen, hydrALAZINE, Melatonin, ondansetron **OR** ondansetron (ZOFRAN) IV, polyethylene glycol  Assesment: She was admitted with acute encephalopathy likely multifactorial.  She has had trouble with taking medications in the past and I think that may be part of the problem.  She may have had a seizure but her EEG here is not suggestive that she is having recurrent seizures.  She has chronic kidney disease that is stable  She has chronic diastolic heart failure also stable  Chronic atrial fib.  On anticoagulation stable  Seem to have trouble swallowing yesterday so I am going to have her get speech evaluation Principal Problem:   Acute encephalopathy Active Problems:   Chronic diastolic heart failure (HCC)   Diabetes mellitus, type II (HCC)   Seizure disorder (HCC)   Atrial fibrillation (HCC)   CKD (  chronic kidney disease), stage III (HCC)   Altered mental status    Plan: Continue treatments.  Request speech evaluation.    LOS: 3 days   Hope Holst L 11/05/2018, 8:42 AM

## 2018-11-06 LAB — GLUCOSE, CAPILLARY
Glucose-Capillary: 102 mg/dL — ABNORMAL HIGH (ref 70–99)
Glucose-Capillary: 115 mg/dL — ABNORMAL HIGH (ref 70–99)
Glucose-Capillary: 109 mg/dL — ABNORMAL HIGH (ref 70–99)
Glucose-Capillary: 84 mg/dL (ref 70–99)

## 2018-11-06 NOTE — Progress Notes (Signed)
Subjective: She is alert.  She still has abnormal speech.  She is mild to moderate aspiration risk per speech therapy.  He may be about as good as she is going to get.  Objective: Vital signs in last 24 hours: Temp:  [98.7 F (37.1 C)-99.3 F (37.4 C)] 98.7 F (37.1 C) (12/03 0459) Pulse Rate:  [93-100] 93 (12/03 0459) Resp:  [16-18] 18 (12/03 0459) BP: (130-170)/(88-110) 135/88 (12/03 0459) SpO2:  [97 %-100 %] 100 % (12/03 0459) Weight:  [80.8 kg] 80.8 kg (12/03 0605) Weight change: 0.6 kg Last BM Date: 11/05/18  Intake/Output from previous day: 12/02 0701 - 12/03 0700 In: 4263.8 [P.O.:240; I.V.:3823.8; IV Piggyback:200] Out: 1600 [Urine:1600]  PHYSICAL EXAM General appearance: alert and no distress Resp: clear to auscultation bilaterally Cardio: irregularly irregular rhythm GI: soft, non-tender; bowel sounds normal; no masses,  no organomegaly Extremities: extremities normal, atraumatic, no cyanosis or edema  Lab Results:  Results for orders placed or performed during the hospital encounter of 11/01/18 (from the past 48 hour(s))  Glucose, capillary     Status: Abnormal   Collection Time: 11/04/18 11:15 AM  Result Value Ref Range   Glucose-Capillary 113 (H) 70 - 99 mg/dL  Glucose, capillary     Status: Abnormal   Collection Time: 11/04/18  4:13 PM  Result Value Ref Range   Glucose-Capillary 102 (H) 70 - 99 mg/dL  Glucose, capillary     Status: None   Collection Time: 11/04/18  9:28 PM  Result Value Ref Range   Glucose-Capillary 95 70 - 99 mg/dL   Comment 1 Notify RN    Comment 2 Document in Chart   Glucose, capillary     Status: Abnormal   Collection Time: 11/05/18  7:47 AM  Result Value Ref Range   Glucose-Capillary 103 (H) 70 - 99 mg/dL   Comment 1 Notify RN   Glucose, capillary     Status: None   Collection Time: 11/05/18 11:39 AM  Result Value Ref Range   Glucose-Capillary 93 70 - 99 mg/dL   Comment 1 Notify RN   Glucose, capillary     Status: None   Collection Time: 11/05/18  4:22 PM  Result Value Ref Range   Glucose-Capillary 95 70 - 99 mg/dL  Glucose, capillary     Status: Abnormal   Collection Time: 11/05/18  9:01 PM  Result Value Ref Range   Glucose-Capillary 116 (H) 70 - 99 mg/dL  Glucose, capillary     Status: Abnormal   Collection Time: 11/06/18  7:40 AM  Result Value Ref Range   Glucose-Capillary 102 (H) 70 - 99 mg/dL   Comment 1 Notify RN    Comment 2 Document in Chart     ABGS No results for input(s): PHART, PO2ART, TCO2, HCO3 in the last 72 hours.  Invalid input(s): PCO2 CULTURES Recent Results (from the past 240 hour(s))  Urine culture     Status: None   Collection Time: 11/01/18  9:01 PM  Result Value Ref Range Status   Specimen Description   Final    URINE, RANDOM Performed at [facility], [address] [place], [place] [address]    Special Requests   Final    NONE Performed at [facility], [address] [place], [place] [address]    Culture   Final    NO GROWTH Performed at [facility], [address] [place], [place] [address]    Report Status 11/03/2018 FINAL  Final  MRSA PCR Screening  Status: Abnormal   Collection Time: 11/02/18  1:51 AM  Result Value Ref Range Status   MRSA by PCR POSITIVE (A) NEGATIVE Final    Comment:        The GeneXpert MRSA Assay (FDA approved for NASAL specimens only), is one component of a comprehensive MRSA colonization surveillance program. It is not intended to diagnose MRSA infection nor to guide or monitor treatment for MRSA infections. RESULT CALLED TO, READ BACK BY AND VERIFIED WITH: FOLEY,B AT 0840 BY HUFFINES,S ON 11/02/18. Performed at [facility], [address] [place], [place] [address]    Studies/Results: No results found.  Medications:  Prior to Admission:  Medications Prior to Admission  Medication Sig Dispense Refill Last Dose  . acetaminophen (TYLENOL) 325 MG tablet Take 650 mg by mouth every 4 (four) hours as needed for  mild pain, fever or headache.   10/26/2018  . apixaban (ELIQUIS) 5 MG TABS tablet Take 5 mg by mouth 2 (two) times daily.    11/02/2018 at Unknown time  . atorvastatin (LIPITOR) 10 MG tablet Take 10 mg by mouth at bedtime.    Past Week at Unknown time  . baclofen (LIORESAL) 10 MG tablet Take 5 mg by mouth at bedtime.   Past Week at Unknown time  . famotidine (PEPCID) 20 MG tablet Take 20 mg by mouth daily.   11/02/2018 at Unknown time  . ferrous sulfate 325 (65 FE) MG tablet Take 325 mg by mouth daily with breakfast.   11/02/2018 at Unknown time  . gabapentin (NEURONTIN) 100 MG capsule Take 200 mg by mouth at bedtime.   11/02/2018 at Unknown time  . levETIRAcetam (KEPPRA) 500 MG tablet Take 500 mg by mouth 2 (two) times daily.   11/02/2018 at Unknown time  . linaclotide (LINZESS) 145 MCG CAPS capsule Take 145 mcg by mouth daily before breakfast.   11/02/2018 at Unknown time  . Melatonin 5 MG TABS Take 5 mg by mouth at bedtime as needed (for sleep).    Past Week at Unknown time  . metoprolol tartrate (LOPRESSOR) 25 MG tablet Take 12.5 mg by mouth 2 (two) times daily.    11/02/2018 at 1700  . nitroGLYCERIN (NITRODUR - DOSED IN MG/24 HR) 0.4 mg/hr patch Place 0.4 mg onto the skin as needed. Give 1 tablet sublingually every 5 minutes as needed fo chest pain   unknown  . potassium chloride (K-DUR,KLOR-CON) 10 MEQ tablet Take 10 mEq by mouth daily.   11/02/2018 at Unknown time  . senna-docusate (SENEXON-S) 8.6-50 MG tablet Take 1 tablet by mouth 2 (two) times daily.   11/02/2018 at Unknown time  . sertraline (ZOLOFT) 50 MG tablet Take 50 mg by mouth daily.   11/02/2018 at Unknown time  . traMADol (ULTRAM) 50 MG tablet Take 50 mg by mouth every 4 (four) hours as needed for moderate pain. Give 1 tablet by mouth every 4 hours as needed for pain   unknown  . traZODone (DESYREL) 50 MG tablet Take 50 mg by mouth at bedtime.   Past Week at Unknown time  . Vitamin D, Ergocalciferol, (DRISDOL) 1.25 MG (50000 UT)  CAPS capsule Take 50,000 Units by mouth every 7 (seven) days.   10/31/2018   Scheduled: . aspirin EC  81 mg Oral Daily  . atorvastatin  40 mg Oral q1800  . Chlorhexidine Gluconate Cloth  6 each Topical Q0600  . enoxaparin (LOVENOX) injection  1 mg/kg Subcutaneous Q12H  . gabapentin  200 mg Oral QHS  .  insulin aspart  0-5 Units Subcutaneous QHS  . insulin aspart  0-9 Units Subcutaneous TID WC  . lubiprostone  24 mcg Oral BID WC  . mupirocin ointment  1 application Nasal BID  . omega-3 acid ethyl esters  2 g Oral BID  . pantoprazole  40 mg Oral Daily  . senna-docusate  1 tablet Oral BID  . sodium chloride flush  3 mL Intravenous Q12H   Continuous: . 0.45 % NaCl with KCl 20 mEq / L 75 mL/hr at 11/05/18 2201  . levETIRAcetam 500 mg (11/05/18 2315)   HQI:[id number]:acetaminophen **OR** acetaminophen, hydrALAZINE, Melatonin, ondansetron **OR** ondansetron (ZOFRAN) IV, polyethylene glycol  Assesment: She was admitted with acute encephalopathy and she is better.  She has chronic atrial fib.  Her swallowing has been a difficulty but it is at least okay.  She does have some risk of aspiration.  I think she is approaching maximum hospital benefit. Principal Problem:   Acute encephalopathy Active Problems:   Chronic diastolic heart failure (HCC)   Diabetes mellitus, type II (HCC)   Seizure disorder (HCC)   Atrial fibrillation (HCC)   CKD (chronic kidney disease), stage III (HCC)   Altered mental status    Plan: Probably discharge tomorrow back to skilled care facility.    LOS: 4 days   Chayson Charters L 11/06/2018, 9:18 AM

## 2018-11-06 NOTE — Clinical Social Work Note (Signed)
Clinical Social Work Assessment  Patient Details  Name: [name] MRN: [id number] Date of Birth: May 26, 1941  Date of referral:  11/06/18               Reason for consult:  Discharge Planning                Permission sought to share information with:    Permission granted to share information::     Name::        Agency::     Relationship::     Contact Information:     Housing/Transportation Living arrangements for the past 2 months:  Single Family Home Source of Information:    Patient Interpreter Needed:  None Criminal Activity/Legal Involvement Pertinent to Current Situation/Hospitalization:  No - Comment as needed Significant Relationships:  Adult Children Lives with:  Facility Resident Do you feel safe going back to the place where you live?  Yes Need for family participation in patient care:  No (Coment)  Care giving concerns: Pt resides at a SNF.  Social Worker assessment / plan: Pt is a 77 year old female known to this LCSW from previous admission. [facility]. Plan is for return to that facility at dc. Per MD, pt will likely be stable for dc tomorrow. Updated [facility]ris [facility]. FL2 started. Will follow up tomorrow to further assist with dc planning needs.  Employment status:  Retired Health and safety inspectornsurance information:  Medicare PT Recommendations:  Not assessed at this time Information / Referral to community resources:     Patient/Family's Response to care: Pt accepting of care.  Patient/Family's Understanding of and Emotional Response to Diagnosis, Current Treatment, and Prognosis: Pt has dementia and is not able to understand diagnosis and treatment recommendations. Pt has supportive family members who do understand. No emotional distress identified.  Emotional Assessment Appearance:  Appears stated age Attitude/Demeanor/Rapport:  Unable to Assess Affect (typically observed):  Pleasant Orientation:  Oriented to  Self Alcohol / Substance use:  Not Applicable Psych involvement (Current and /or in the community):  No (Comment)  Discharge Needs  Concerns to be addressed:  Discharge Planning Concerns Readmission within the last 30 days:  No Current discharge risk:  None Barriers to Discharge:  No Barriers Identified   Elliot GaultKathleen Payslee Bateson, LCSW 11/06/2018, 11:50 AM

## 2018-11-06 NOTE — [place] FL2 (Signed)
[place] MEDICAID FL2 LEVEL OF CARE SCREENING TOOL     IDENTIFICATION  Patient Name: [name] Birthdate: 02/23/1941 Sex: female Admission Date (Current Location): 11/01/2018  [facility] and [place] Number:  [employer] and Address:  [facility],  [address], [place] [address]      Provider Number: [id number]  Attending Physician Name and Address:  Kari Baars, MD  Relative Name and Phone Number:       Current Level of Care: Hospital Recommended Level of Care: Skilled Nursing Facility Prior Approval Number:    Date Approved/Denied:   PASRR Number:    Discharge Plan: SNF    Current Diagnoses: Patient Active Problem List   Diagnosis Date Noted  . Altered mental status 11/02/2018  . Acute encephalopathy 11/01/2018  . Urinary tract infection 10/18/2017  . HCAP (healthcare-associated pneumonia) 09/26/2017  . Lobar pneumonia (HCC) 09/19/2017  . Acute metabolic encephalopathy 09/19/2017  . Concussion with no loss of consciousness 09/28/2016  . CKD (chronic kidney disease), stage III (HCC) 09/25/2016  . Fall 09/25/2016  . Atrial fibrillation (HCC) 02/06/2015  . Encephalopathy, metabolic 10/12/2014  . Intractable nausea and vomiting 10/09/2014  . Epigastric abdominal pain 10/09/2014  . Expected blood loss anemia 12/04/2013  . S/P left THA, AA 12/03/2013  . Protein-calorie malnutrition, severe (HCC) 10/02/2013  . Precordial pain 09/30/2013  . Failure to thrive 07/26/2013  . Anemia of chronic disease 07/13/2013  . Altered mental state 07/13/2013  . NSTEMI (non-ST elevated myocardial infarction) (HCC) 07/01/2013  . Acute delirium 06/29/2013  . Fecal impaction (HCC) 06/29/2013  . Total knee replacement status 05/22/2013  . H/O total hip arthroplasty 05/22/2013  . Spinal stenosis, lumbar region, with neurogenic claudication 05/22/2013  . Osteoarthritis of left hip 05/22/2013  . Noninfectious gastroenteritis and colitis 03/05/2013  .  Dehydration 03/05/2013  . Family history of colon cancer 02/15/2013  . PUD (peptic ulcer disease) 02/15/2013  . Diabetes mellitus, type II (HCC)   . Hypertension   . Arteriosclerotic cardiovascular disease (ASCVD)   . Gastroesophageal reflux disease   . Hyperlipidemia   . Deep vein thrombosis, upper right extremity (HCC)   . Obesity   . AV block   . COPD (chronic obstructive pulmonary disease) (HCC)   . Sleep apnea   . Chronic kidney disease (CKD), stage III (moderate) (HCC)   . Seizure disorder (HCC)   . Anemia, normocytic normochromic 12/15/2011  . Chronic diastolic heart failure (HCC) 11/18/2011  . PACEMAKER, PERMANENT 05/13/2009  . 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 GASTRIC ULCER 05/12/2009  . Gastroparesis 05/12/2009  . DIVERTICULAR DISEASE 05/12/2009    Orientation RESPIRATION BLADDER Height & Weight     Self  Normal Incontinent Weight: 178 lb 2.1 oz (80.8 kg) Height:  5\' 6"  (167.6 cm)  BEHAVIORAL SYMPTOMS/MOOD NEUROLOGICAL BOWEL NUTRITION STATUS      Incontinent Diet(see dc summary)  AMBULATORY STATUS COMMUNICATION OF NEEDS Skin   Limited Assist Verbally Normal                       Personal Care Assistance Level of Assistance  Bathing, Feeding, Dressing Bathing Assistance: Maximum assistance Feeding assistance: Limited assistance Dressing Assistance: Maximum assistance     Functional Limitations Info  Sight, Hearing, Speech Sight Info: Adequate Hearing Info: Adequate Speech Info: Impaired    SPECIAL CARE FACTORS FREQUENCY  Contractures Contractures Info: Not present    Additional Factors Info  Code Status, Allergies Code Status Info: DNR Allergies Info: Codeine, Food (leavy green vegetables and seeds), Levaquin, Oxycodone Hcl, Peanut containing drug products, Reglan           Current Medications (11/06/2018):  This is the current hospital active medication list Current Facility-Administered Medications  Medication  Dose Route Frequency Provider Last Rate Last Dose  . 0.45 % NaCl with KCl 20 mEq / L infusion   Intravenous Continuous Erick BlinksMemon, Jehanzeb, MD 75 mL/hr at 11/05/18 2201    . acetaminophen (TYLENOL) tablet 650 mg  650 mg Oral Q6H PRN Opyd, Lavone Neriimothy S, MD       Or  . acetaminophen (TYLENOL) suppository 650 mg  650 mg Rectal Q6H PRN Opyd, Lavone Neriimothy S, MD      . aspirin EC tablet 81 mg  81 mg Oral Daily Opyd, Lavone Neriimothy S, MD   81 mg at 11/06/18 1021  . atorvastatin (LIPITOR) tablet 40 mg  40 mg Oral q1800 Opyd, Lavone Neriimothy S, MD   40 mg at 11/05/18 1717  . Chlorhexidine Gluconate Cloth 2 % PADS 6 each  6 each Topical Q0600 Kari BaarsHawkins, Edward, MD   6 each at 11/06/18 0547  . enoxaparin (LOVENOX) injection 80 mg  1 mg/kg Subcutaneous Q12H Kari BaarsHawkins, Edward, MD   80 mg at 11/06/18 1021  . gabapentin (NEURONTIN) capsule 200 mg  200 mg Oral QHS Opyd, Lavone Neriimothy S, MD   200 mg at 11/05/18 2155  . hydrALAZINE (APRESOLINE) injection 10 mg  10 mg Intravenous Q6H PRN Kari BaarsHawkins, Edward, MD   10 mg at 11/05/18 2150  . insulin aspart (novoLOG) injection 0-5 Units  0-5 Units Subcutaneous QHS Opyd, Timothy S, MD      . insulin aspart (novoLOG) injection 0-9 Units  0-9 Units Subcutaneous TID WC Opyd, Lavone Neriimothy S, MD   1 Units at 11/03/18 1657  . levETIRAcetam (KEPPRA) IVPB 500 mg/100 mL premix  500 mg Intravenous Thomasene MohairQ12H Hawkins, Edward, MD 400 mL/hr at 11/05/18 2315 500 mg at 11/05/18 2315  . lubiprostone (AMITIZA) capsule 24 mcg  24 mcg Oral BID WC Briscoe Deutscherpyd, Timothy S, MD   24 mcg at 11/06/18 0851  . Melatonin TABS 3 mg  3 mg Oral QHS PRN Opyd, Lavone Neriimothy S, MD      . mupirocin ointment (BACTROBAN) 2 % 1 application  1 application Nasal BID Kari BaarsHawkins, Edward, MD   1 application at 11/06/18 1021  . omega-3 acid ethyl esters (LOVAZA) capsule 2 g  2 g Oral BID Opyd, Lavone Neriimothy S, MD   2 g at 11/06/18 1021  . ondansetron (ZOFRAN) tablet 4 mg  4 mg Oral Q6H PRN Opyd, Lavone Neriimothy S, MD       Or  . ondansetron (ZOFRAN) injection 4 mg  4 mg Intravenous Q6H PRN  Opyd, Lavone Neriimothy S, MD   4 mg at 11/04/18 1040  . pantoprazole (PROTONIX) EC tablet 40 mg  40 mg Oral Daily Opyd, Lavone Neriimothy S, MD   40 mg at 11/05/18 0854  . polyethylene glycol (MIRALAX / GLYCOLAX) packet 17 g  17 g Oral Daily PRN Opyd, Lavone Neriimothy S, MD      . senna-docusate (Senokot-S) tablet 1 tablet  1 tablet Oral BID Opyd, Lavone Neriimothy S, MD   1 tablet at 11/05/18 2155  . sodium chloride flush (NS) 0.9 % injection 3 mL  3 mL Intravenous Q12H Opyd, Lavone Neriimothy S, MD   3 mL at 11/06/18 1021  Discharge Medications: Please see discharge summary for a list of discharge medications.  Relevant Imaging Results:  Relevant Lab Results:   Additional Information    Shade Flood, LCSW

## 2018-11-06 NOTE — Progress Notes (Signed)
Rehab Admissions Coordinator Note:  Per SLP recommendation, Patient was screened by Nanine MeansKelly Tymara Saur for appropriateness for an Inpatient Acute Rehab Consult.  Noted pt is [facility] with plan to return to that facility when medically stable. Also noted, SLP recommendation is SNF for follow up. AC will sign off and discontinue screen order.    Nanine MeansKelly Muzamil Harker 11/06/2018, 12:44 PM  I can be reached at [phone].

## 2018-11-07 LAB — GLUCOSE, CAPILLARY
Glucose-Capillary: 104 mg/dL — ABNORMAL HIGH (ref 70–99)
Glucose-Capillary: 92 mg/dL (ref 70–99)

## 2018-11-07 IMAGING — DX DG FOOT COMPLETE 3+V*L*
3 series · 3 of 3 positions shown · non-contrast
Comparison: None.

CLINICAL DATA: Pain with walking without trauma. Soft tissue
swelling.

EXAM:
LEFT FOOT - COMPLETE 3+ VIEW

[foot ap]
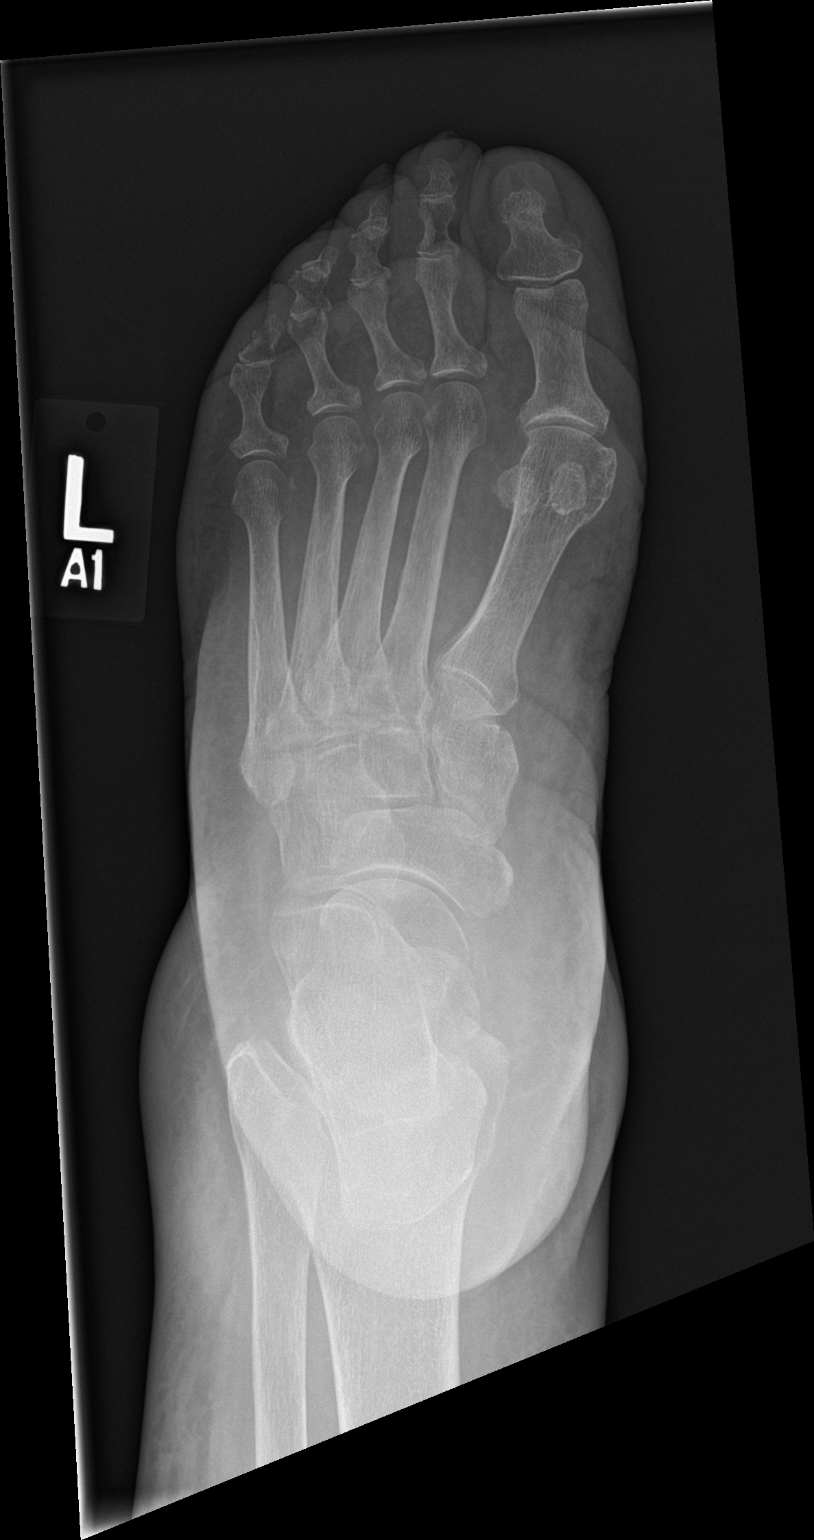

[foot obl]
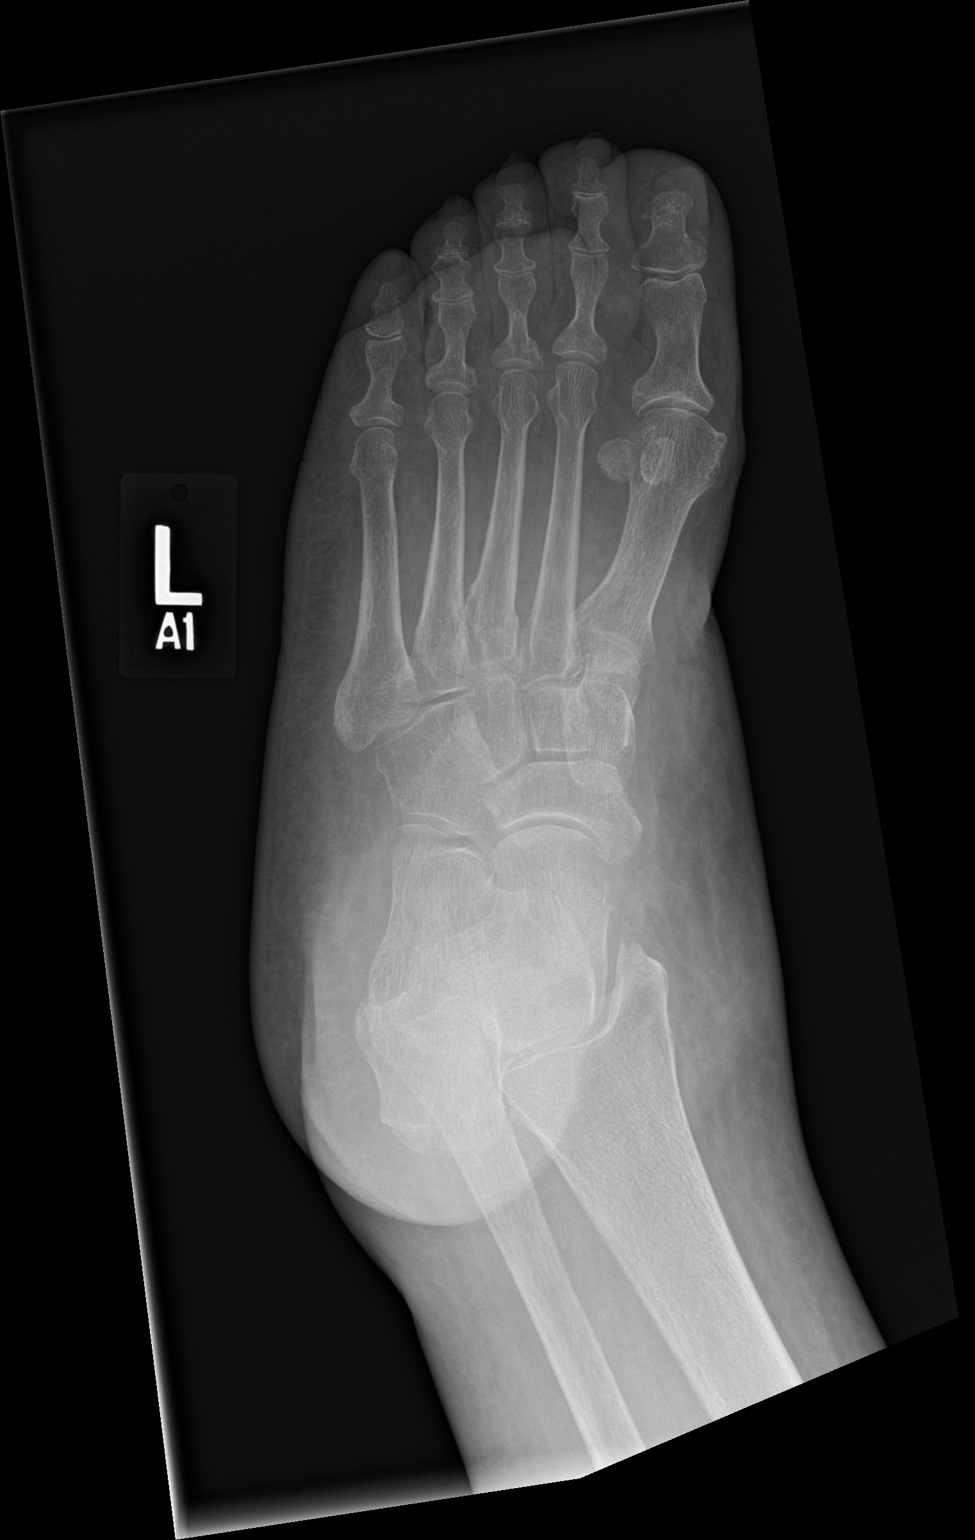

[foot lat]
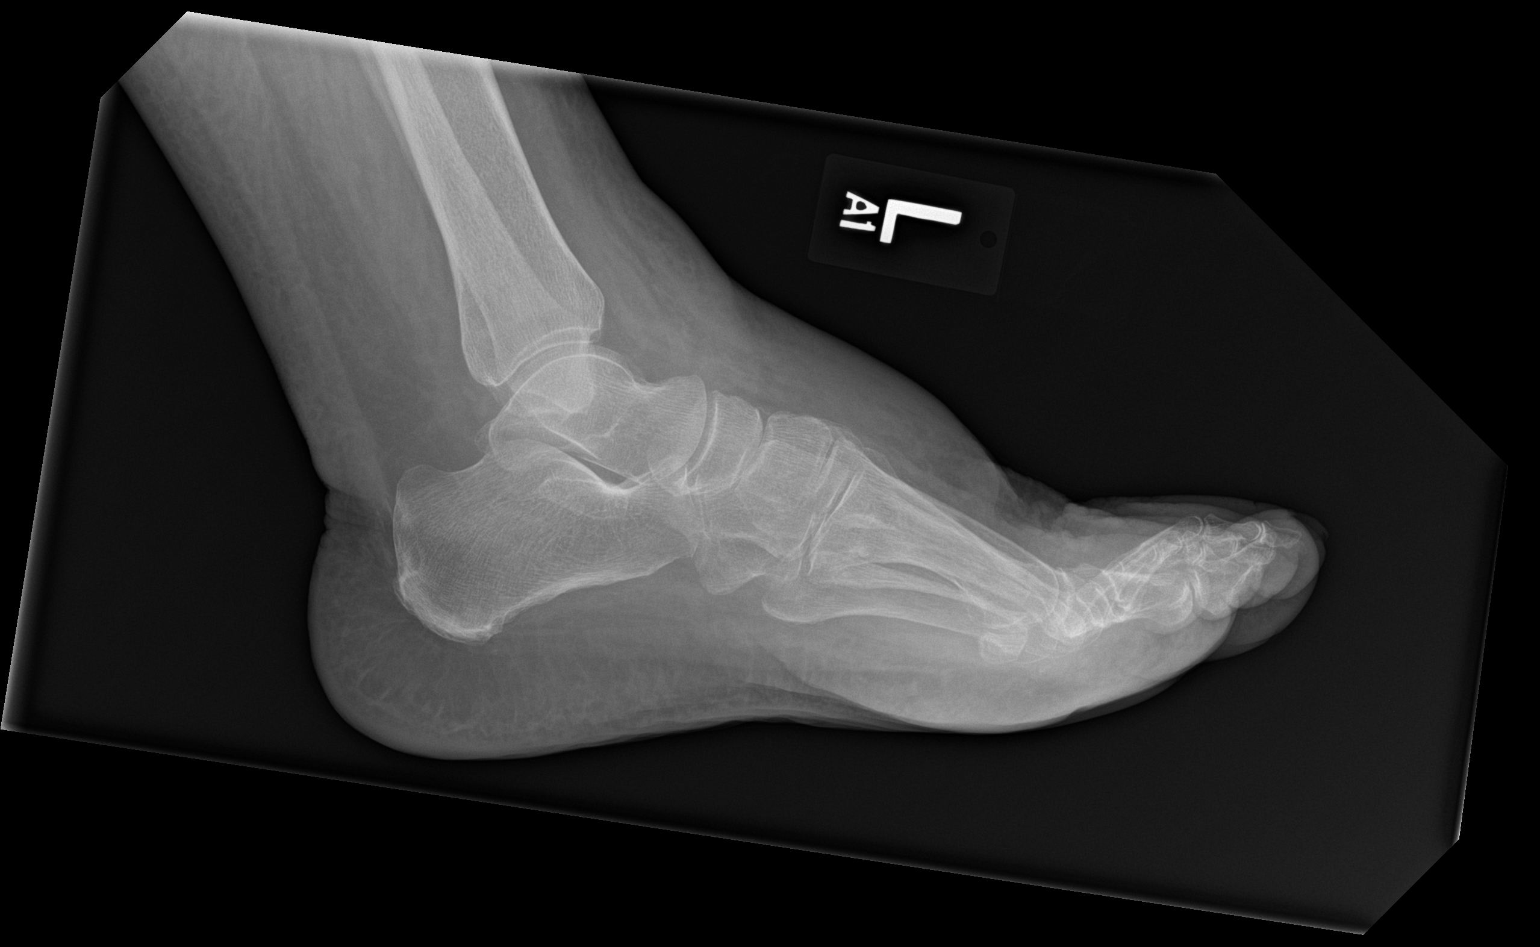

[3 of 3 positions shown; findings below may reference images not displayed]

FINDINGS: No fracture or bony abnormality. Diffuse soft tissue swelling is
noted.
IMPRESSION: Diffuse soft tissue swelling.  No bony abnormality.

## 2018-11-07 IMAGING — DX DG ANKLE COMPLETE 3+V*L*
3 series · 3 of 3 positions shown · non-contrast
Comparison: None.

CLINICAL DATA: Lateral ankle pain and swelling with walking. No
known trauma.

EXAM:
LEFT ANKLE COMPLETE - 3+ VIEW

[ankle ap]
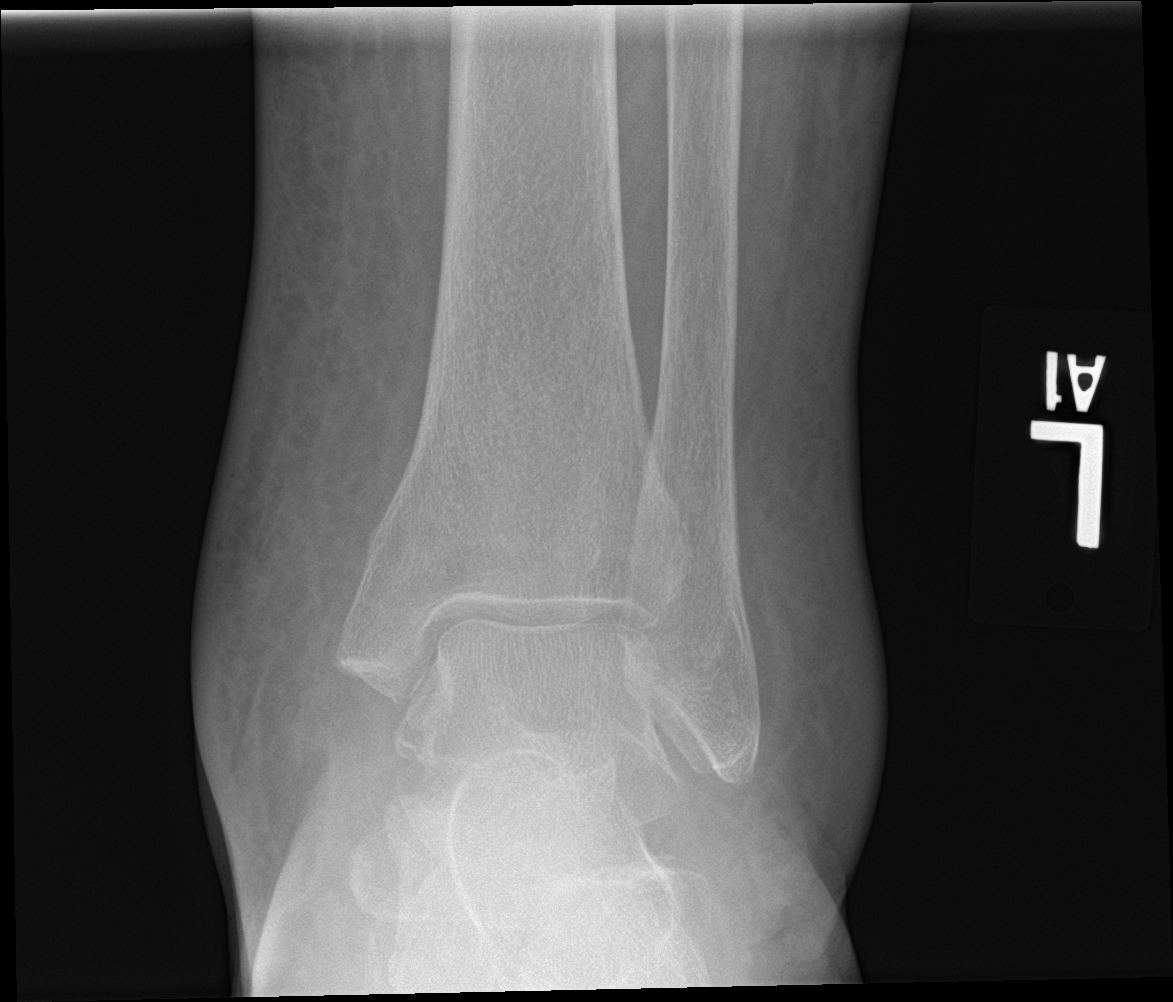

[ankle obl]
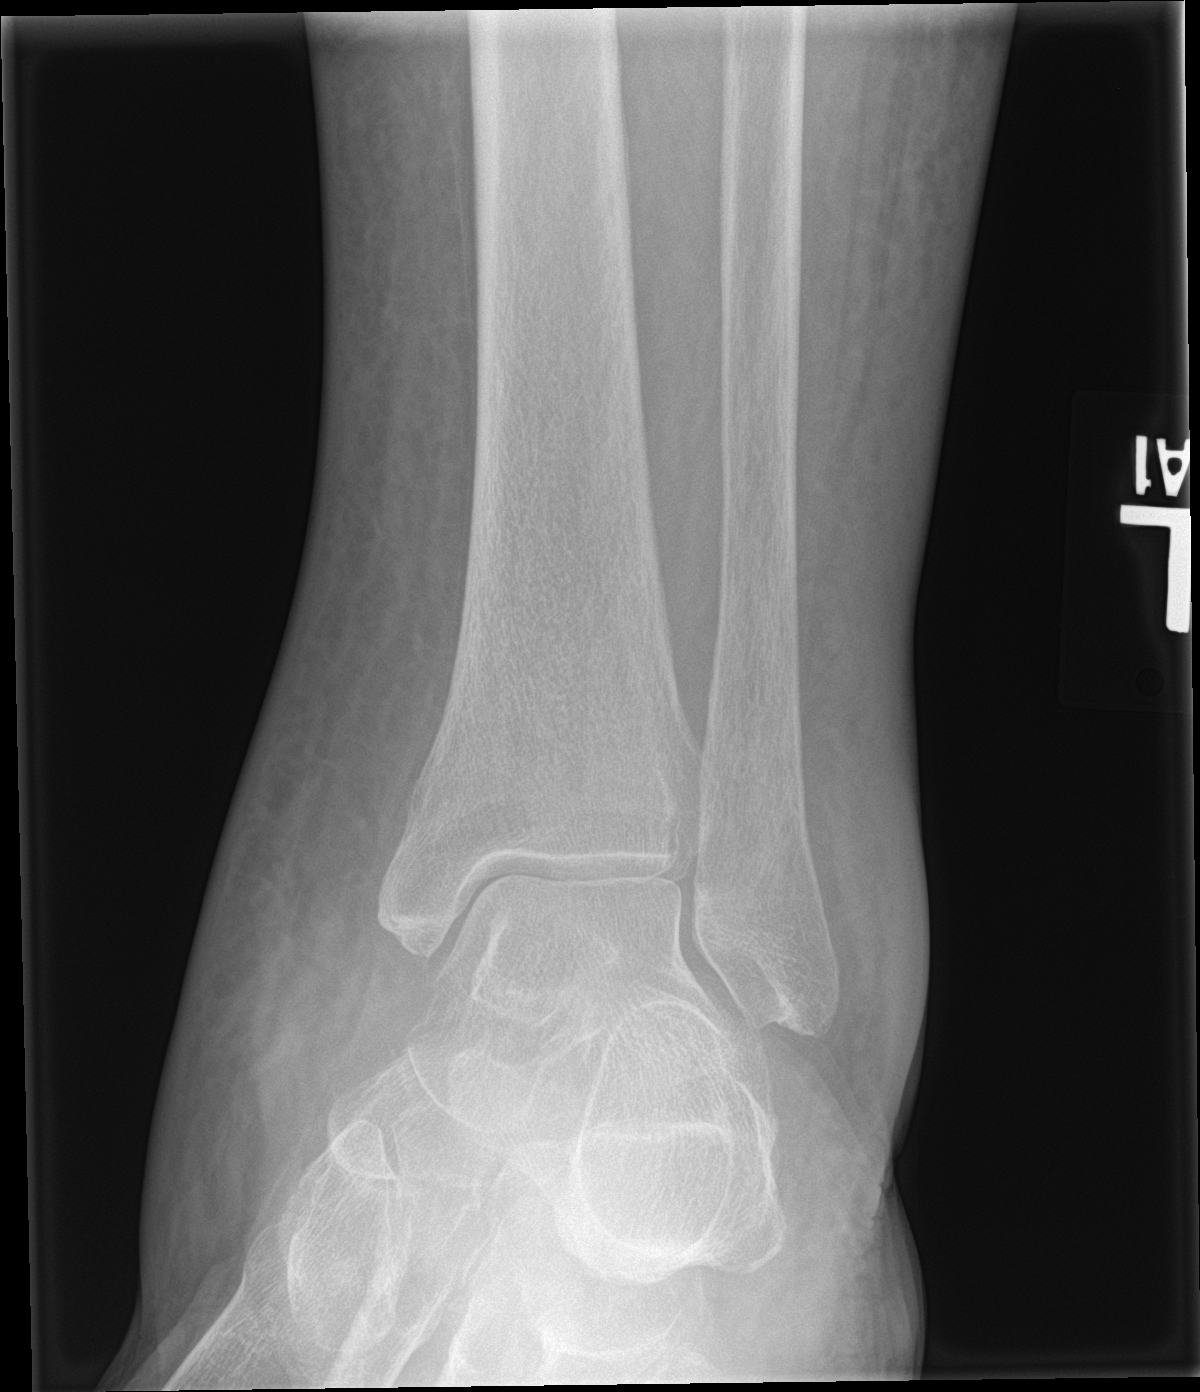

[ankle lat]
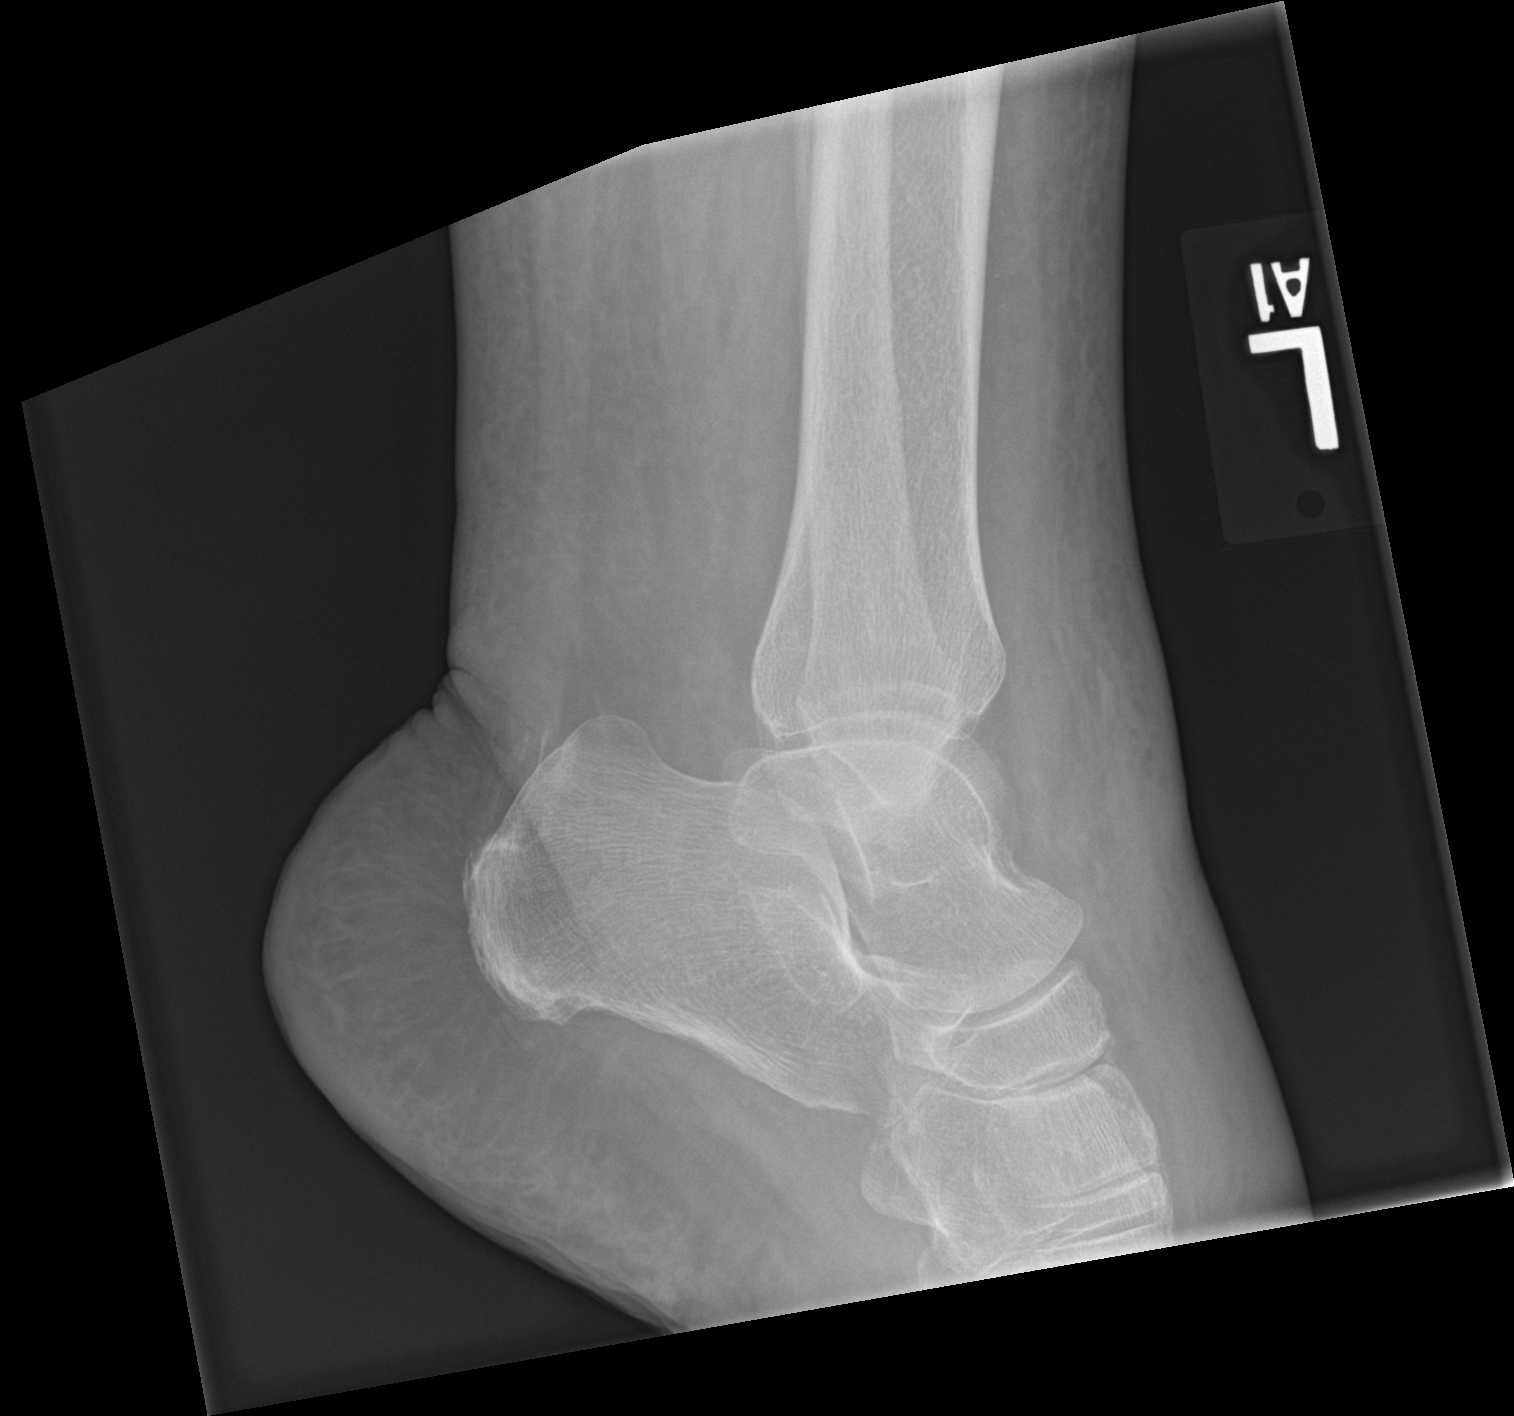

[3 of 3 positions shown; findings below may reference images not displayed]

FINDINGS: Diffuse soft tissue swelling identified. No soft tissue gas is
noted. No fracture is seen. The ankle mortise is intact. No bony
erosion. No other acute abnormalities.
IMPRESSION: Soft tissue swelling with no underlying bony abnormality.

## 2018-11-07 MED ORDER — LEVETIRACETAM 500 MG PO TABS: 500.0000 mg | ORAL_TABLET | Freq: Two times a day (BID) | ORAL | Status: DC

## 2018-11-07 MED ORDER — APIXABAN 5 MG PO TABS: 5.0000 mg | ORAL_TABLET | Freq: Two times a day (BID) | ORAL | Status: DC

## 2018-11-07 NOTE — Care Management Important Message (Signed)
Important Message  Patient Details  Name: Dayle PointsSilver P Prime MRN: [id number] Date of Birth: 03-Feb-1941   Medicare Important Message Given:  Yes    Malcolm MetroChildress, Chayla Shands Demske, RN 11/07/2018, 1:26 PM

## 2018-11-07 NOTE — Plan of Care (Signed)
  Problem: Education: Goal: Knowledge of General Education information will improve Description Including pain rating scale, medication(s)/side effects and non-pharmacologic comfort measures Outcome: Adequate for Discharge   Problem: Health Behavior/Discharge Planning: Goal: Ability to manage health-related needs will improve Outcome: Adequate for Discharge   Problem: Clinical Measurements: Goal: Ability to maintain clinical measurements within normal limits will improve Outcome: Adequate for Discharge Goal: Will remain free from infection Outcome: Adequate for Discharge Goal: Diagnostic test results will improve Outcome: Adequate for Discharge Goal: Respiratory complications will improve Outcome: Adequate for Discharge Goal: Cardiovascular complication will be avoided Outcome: Adequate for Discharge   Problem: Activity: Goal: Risk for activity intolerance will decrease Outcome: Adequate for Discharge   Problem: Nutrition: Goal: Adequate nutrition will be maintained Outcome: Adequate for Discharge   Problem: Coping: Goal: Level of anxiety will decrease Outcome: Adequate for Discharge   Problem: Elimination: Goal: Will not experience complications related to bowel motility Outcome: Adequate for Discharge Goal: Will not experience complications related to urinary retention Outcome: Adequate for Discharge   Problem: Pain Managment: Goal: General experience of comfort will improve Outcome: Adequate for Discharge   Problem: Safety: Goal: Ability to remain free from injury will improve Outcome: Adequate for Discharge   Problem: Skin Integrity: Goal: Risk for impaired skin integrity will decrease Outcome: Adequate for Discharge   Problem: Spiritual Needs Goal: Ability to function at adequate level Outcome: Adequate for Discharge   

## 2018-11-07 NOTE — Discharge Summary (Signed)
Physician Discharge Summary  Patient ID: [name] MRN: [id number] DOB/AGE: [birth year] 77 y.o. Primary Care Physician:Jeanclaude Wentworth, Ramon Dredge, MD Admit date: 11/01/2018 Discharge date: 11/07/2018    Discharge Diagnoses:   Principal Problem:   Acute encephalopathy Active Problems:   Chronic diastolic heart failure (HCC)   Diabetes mellitus, type II (HCC)   Seizure disorder (HCC)   Atrial fibrillation (HCC)   CKD (chronic kidney disease), stage III (HCC)   Altered mental status   Allergies as of 11/07/2018      Reactions   Codeine Other (See Comments)   Reaction:  Headaches    Food Nausea And Vomiting, Other (See Comments)   Pt is allergic to leafy raw vegetables and seeds.     Levaquin [levofloxacin] Other (See Comments)   Confusion, altered mental status, inability to walk   Oxycodone Hcl Other (See Comments)   Reaction:  Headaches    Peanut-containing Drug Products Nausea And Vomiting   Reglan [metoclopramide] Other (See Comments)   Reaction:  Confusion       Medication List    STOP taking these medications   baclofen 10 MG tablet Commonly known as:  LIORESAL     TAKE these medications   acetaminophen 325 MG tablet Commonly known as:  TYLENOL Take 650 mg by mouth every 4 (four) hours as needed for mild pain, fever or headache.   apixaban 5 MG Tabs tablet Commonly known as:  ELIQUIS Take 5 mg by mouth 2 (two) times daily.   atorvastatin 10 MG tablet Commonly known as:  LIPITOR Take 10 mg by mouth at bedtime.   famotidine 20 MG tablet Commonly known as:  PEPCID Take 20 mg by mouth daily.   ferrous sulfate 325 (65 FE) MG tablet Take 325 mg by mouth daily with breakfast.   gabapentin 100 MG capsule Commonly known as:  NEURONTIN Take 200 mg by mouth at bedtime.   levETIRAcetam 500 MG tablet Commonly known as:  KEPPRA Take 500 mg by mouth 2 (two) times daily.   LINZESS 145 MCG Caps capsule Generic drug:  linaclotide Take 145 mcg by mouth daily before  breakfast.   Melatonin 5 MG Tabs Take 5 mg by mouth at bedtime as needed (for sleep).   metoprolol tartrate 25 MG tablet Commonly known as:  LOPRESSOR Take 12.5 mg by mouth 2 (two) times daily.   nitroGLYCERIN 0.4 mg/hr patch Commonly known as:  NITRODUR - Dosed in mg/24 hr Place 0.4 mg onto the skin as needed. Give 1 tablet sublingually every 5 minutes as needed fo chest pain   potassium chloride 10 MEQ tablet Commonly known as:  K-DUR,KLOR-CON Take 10 mEq by mouth daily.   SENEXON-S 8.6-50 MG tablet Generic drug:  senna-docusate Take 1 tablet by mouth 2 (two) times daily.   sertraline 50 MG tablet Commonly known as:  ZOLOFT Take 50 mg by mouth daily.   traMADol 50 MG tablet Commonly known as:  ULTRAM Take 50 mg by mouth every 4 (four) hours as needed for moderate pain. Give 1 tablet by mouth every 4 hours as needed for pain   traZODone 50 MG tablet Commonly known as:  DESYREL Take 50 mg by mouth at bedtime.   Vitamin D (Ergocalciferol) 1.25 MG (50000 UT) Caps capsule Commonly known as:  DRISDOL Take 50,000 Units by mouth every 7 (seven) days.       Discharged Condition: Improved    Consults: None  Significant Diagnostic Studies: Dg Chest 1 View  Result Date: 11/01/2018  CLINICAL DATA:  Altered mental status EXAM: CHEST  1 VIEW COMPARISON:  10/18/2017 FINDINGS: Left-sided central venous port tip over the SVC. Right-sided pacing device as before. Cardiomegaly without acute opacity or pleural effusion. No pneumothorax. IMPRESSION: No active disease.  Cardiomegaly Electronically Signed   By: Jasmine Pang M.D.   On: 11/01/2018 21:30   Ct Head Wo Contrast  Result Date: 11/01/2018 CLINICAL DATA:  Initial evaluation for acute lethargy, confusion, decreased level of consciousness. EXAM: CT HEAD WITHOUT CONTRAST TECHNIQUE: Contiguous axial images were obtained from the base of the skull through the vertex without intravenous contrast. COMPARISON:  Prior CT from  09/26/2017. FINDINGS: Brain: Generalized age-related cerebral atrophy with moderate to advanced chronic microvascular ischemic disease, mildly progressed relative to 2018. No acute intracranial hemorrhage. No acute large vessel territory infarct. No mass lesion, midline shift or mass effect. No hydrocephalus. No extra-axial fluid collection. Vascular: No hyperdense vessel. Calcified atherosclerosis at the skull base. Skull: Scalp soft tissues and calvarium demonstrate no acute abnormality. Sinuses/Orbits: Globes and orbital soft tissues within normal limits. Paranasal sinuses and mastoid air cells are clear. Other: None. IMPRESSION: 1. No acute intracranial abnormality. 2. Generalized age-related cerebral atrophy with moderate to advanced chronic microvascular ischemic changes, mildly progressed relative to 2018. Electronically Signed   By: Rise Mu M.D.   On: 11/01/2018 22:07    Lab Results: Basic Metabolic Panel: No results for input(s): NA, K, CL, CO2, GLUCOSE, BUN, CREATININE, CALCIUM, MG, PHOS in the last 72 hours. Liver Function Tests: No results for input(s): AST, ALT, ALKPHOS, BILITOT, PROT, ALBUMIN in the last 72 hours.   CBC: No results for input(s): WBC, NEUTROABS, HGB, HCT, MCV, PLT in the last 72 hours.  Recent Results (from the past 240 hour(s))  Urine culture     Status: None   Collection Time: 11/01/18  9:01 PM  Result Value Ref Range Status   Specimen Description   Final    URINE, RANDOM Performed at [facility], [address] [place], [place] [address]    Special Requests   Final    NONE Performed at [facility], [address], [place], [place] [address]    Culture   Final    NO GROWTH Performed at [facility], [address], [place], [place] [address]    Report Status 11/03/2018 FINAL  Final  MRSA PCR Screening     Status: Abnormal   Collection Time: 11/02/18  1:51 AM  Result Value Ref Range Status   MRSA by PCR POSITIVE (A) NEGATIVE Final     Comment:        The GeneXpert MRSA Assay (FDA approved for NASAL specimens only), is one component of a comprehensive MRSA colonization surveillance program. It is not intended to diagnose MRSA infection nor to guide or monitor treatment for MRSA infections. RESULT CALLED TO, READ BACK BY AND VERIFIED WITH: FOLEY,B AT 0840 BY HUFFINES,S ON 11/02/18. Performed at [facility], [address], [place], [place] [address]      Hospital Course: This is a 77 year old who is been a resident at a skilled care facility for some time.  She was in her usual state of poor health and was started on baclofen and that seemed to have caused her to have altered mental status although her altered mental status was felt to be multifactorial.  She was not as responsive at the skilled care facility and eventually was brought here.  She remained poorly responsive for about 48 hours and  then woke up and was back at her normal mental status.  She is still confused but alert and able to converse.  Discharge Exam: Blood pressure (!) 186/96, pulse 85, temperature (!) 97.5 F (36.4 C), temperature source Oral, resp. rate 18, height 5\' 6"  (1.676 m), weight 81 kg, SpO2 95 %. She is awake.  She is alert.  Back to baseline.  Disposition: Back to her skilled care facility.  She will have PT speech and OT as needed.      Signed: Darick Fetters L   11/07/2018, 9:56 AM

## 2018-11-07 NOTE — Progress Notes (Addendum)
[facility] called [facility] to give report.  Phone rings continuously with no answer.  EMS here at this time to transport

## 2018-11-07 NOTE — Progress Notes (Signed)
Subjective: She is overall about the same.  No new problems noted.  Objective: Vital signs in last 24 hours: Temp:  [97.5 F (36.4 C)-99.1 F (37.3 C)] 97.5 F (36.4 C) (12/04 0549) Pulse Rate:  [85-90] 85 (12/04 0551) Resp:  [18] 18 (12/04 0549) BP: (158-190)/(96-105) 186/96 (12/04 0551) SpO2:  [95 %-98 %] 95 % (12/04 0551) Weight:  [81 kg] 81 kg (12/04 0500) Weight change: 0.2 kg Last BM Date: 11/06/18  Intake/Output from previous day: 12/03 0701 - 12/04 0700 In: 2845 [P.O.:720; I.V.:1825; IV Piggyback:300] Out: 1800 [Urine:1800]  PHYSICAL EXAM General appearance: She is awake and conversing but confused Resp: clear to auscultation bilaterally Cardio: irregularly irregular rhythm GI: soft, non-tender; bowel sounds normal; no masses,  no organomegaly Extremities: extremities normal, atraumatic, no cyanosis or edema  Lab Results:  Results for orders placed or performed during the hospital encounter of 11/01/18 (from the past 48 hour(s))  Glucose, capillary     Status: None   Collection Time: 11/05/18 11:39 AM  Result Value Ref Range   Glucose-Capillary 93 70 - 99 mg/dL   Comment 1 Notify RN   Glucose, capillary     Status: None   Collection Time: 11/05/18  4:22 PM  Result Value Ref Range   Glucose-Capillary 95 70 - 99 mg/dL  Glucose, capillary     Status: Abnormal   Collection Time: 11/05/18  9:01 PM  Result Value Ref Range   Glucose-Capillary 116 (H) 70 - 99 mg/dL  Glucose, capillary     Status: Abnormal   Collection Time: 11/06/18  7:40 AM  Result Value Ref Range   Glucose-Capillary 102 (H) 70 - 99 mg/dL   Comment 1 Notify RN    Comment 2 Document in Chart   Glucose, capillary     Status: Abnormal   Collection Time: 11/06/18 11:11 AM  Result Value Ref Range   Glucose-Capillary 115 (H) 70 - 99 mg/dL   Comment 1 Notify RN    Comment 2 Document in Chart   Glucose, capillary     Status: Abnormal   Collection Time: 11/06/18  4:27 PM  Result Value Ref Range   Glucose-Capillary 109 (H) 70 - 99 mg/dL  Glucose, capillary     Status: None   Collection Time: 11/06/18  9:31 PM  Result Value Ref Range   Glucose-Capillary 84 70 - 99 mg/dL   Comment 1 Notify RN    Comment 2 Document in Chart   Glucose, capillary     Status: Abnormal   Collection Time: 11/07/18  7:53 AM  Result Value Ref Range   Glucose-Capillary 104 (H) 70 - 99 mg/dL   Comment 1 Notify RN    Comment 2 Document in Chart     ABGS No results for input(s): PHART, PO2ART, TCO2, HCO3 in the last 72 hours.  Invalid input(s): PCO2 CULTURES Recent Results (from the past 240 hour(s))  Urine culture     Status: None   Collection Time: 11/01/18  9:01 PM  Result Value Ref Range Status   Specimen Description   Final    URINE, RANDOM Performed at [facility], [address] [place], [place] [address]    Special Requests   Final    NONE Performed at [facility], [address], [place], [place] [address]    Culture   Final    NO GROWTH Performed at [facility], [address], [place], [place] [address]    Report Status 11/03/2018 FINAL  Final  MRSA PCR Screening     Status: Abnormal   Collection Time: 11/02/18  1:51 AM  Result Value Ref Range Status   MRSA by PCR POSITIVE (A) NEGATIVE Final    Comment:        The GeneXpert MRSA Assay (FDA approved for NASAL specimens only), is one component of a comprehensive MRSA colonization surveillance program. It is not intended to diagnose MRSA infection nor to guide or monitor treatment for MRSA infections. RESULT CALLED TO, READ BACK BY AND VERIFIED WITH: FOLEY,B AT 0840 BY HUFFINES,S ON 11/02/18. Performed at [facility], [address] [place], [place] [address]    Studies/Results: No results found.  Medications:  Prior to Admission:  Medications Prior to Admission  Medication Sig Dispense Refill Last Dose  . acetaminophen (TYLENOL) 325 MG tablet Take 650 mg by mouth every 4 (four) hours as needed for mild pain,  fever or headache.   10/26/2018  . apixaban (ELIQUIS) 5 MG TABS tablet Take 5 mg by mouth 2 (two) times daily.    11/02/2018 at Unknown time  . atorvastatin (LIPITOR) 10 MG tablet Take 10 mg by mouth at bedtime.    Past Week at Unknown time  . baclofen (LIORESAL) 10 MG tablet Take 5 mg by mouth at bedtime.   Past Week at Unknown time  . famotidine (PEPCID) 20 MG tablet Take 20 mg by mouth daily.   11/02/2018 at Unknown time  . ferrous sulfate 325 (65 FE) MG tablet Take 325 mg by mouth daily with breakfast.   11/02/2018 at Unknown time  . gabapentin (NEURONTIN) 100 MG capsule Take 200 mg by mouth at bedtime.   11/02/2018 at Unknown time  . levETIRAcetam (KEPPRA) 500 MG tablet Take 500 mg by mouth 2 (two) times daily.   11/02/2018 at Unknown time  . linaclotide (LINZESS) 145 MCG CAPS capsule Take 145 mcg by mouth daily before breakfast.   11/02/2018 at Unknown time  . Melatonin 5 MG TABS Take 5 mg by mouth at bedtime as needed (for sleep).    Past Week at Unknown time  . metoprolol tartrate (LOPRESSOR) 25 MG tablet Take 12.5 mg by mouth 2 (two) times daily.    11/02/2018 at 1700  . nitroGLYCERIN (NITRODUR - DOSED IN MG/24 HR) 0.4 mg/hr patch Place 0.4 mg onto the skin as needed. Give 1 tablet sublingually every 5 minutes as needed fo chest pain   unknown  . potassium chloride (K-DUR,KLOR-CON) 10 MEQ tablet Take 10 mEq by mouth daily.   11/02/2018 at Unknown time  . senna-docusate (SENEXON-S) 8.6-50 MG tablet Take 1 tablet by mouth 2 (two) times daily.   11/02/2018 at Unknown time  . sertraline (ZOLOFT) 50 MG tablet Take 50 mg by mouth daily.   11/02/2018 at Unknown time  . traMADol (ULTRAM) 50 MG tablet Take 50 mg by mouth every 4 (four) hours as needed for moderate pain. Give 1 tablet by mouth every 4 hours as needed for pain   unknown  . traZODone (DESYREL) 50 MG tablet Take 50 mg by mouth at bedtime.   Past Week at Unknown time  . Vitamin D, Ergocalciferol, (DRISDOL) 1.25 MG (50000 UT) CAPS capsule  Take 50,000 Units by mouth every 7 (seven) days.   10/31/2018   Scheduled: . aspirin EC  81 mg Oral Daily  . atorvastatin  40 mg Oral q1800  . Chlorhexidine Gluconate Cloth  6 each Topical Q0600  . enoxaparin (LOVENOX) injection  1 mg/kg Subcutaneous Q12H  .  gabapentin  200 mg Oral QHS  . insulin aspart  0-5 Units Subcutaneous QHS  . insulin aspart  0-9 Units Subcutaneous TID WC  . lubiprostone  24 mcg Oral BID WC  . mupirocin ointment  1 application Nasal BID  . omega-3 acid ethyl esters  2 g Oral BID  . pantoprazole  40 mg Oral Daily  . senna-docusate  1 tablet Oral BID  . sodium chloride flush  3 mL Intravenous Q12H   Continuous: . 0.45 % NaCl with KCl 20 mEq / L 75 mL/hr at 11/07/18 0205  . levETIRAcetam Stopped (11/06/18 2341)   ZOX:[id number] **OR** acetaminophen, hydrALAZINE, Melatonin, ondansetron **OR** ondansetron (ZOFRAN) IV, polyethylene glycol  Assesment: She was admitted with acute encephalopathy likely multifactorial.  She is better.  She came from a skilled care facility and I think she is probably at maximum hospital benefit now. Principal Problem:   Acute encephalopathy Active Problems:   Chronic diastolic heart failure (HCC)   Diabetes mellitus, type II (HCC)   Seizure disorder (HCC)   Atrial fibrillation (HCC)   CKD (chronic kidney disease), stage III (HCC)   Altered mental status    Plan: Transfer back to skilled care facility when bed available    LOS: 5 days   Ruven Corradi L 11/07/2018, 9:25 AM

## 2018-11-07 NOTE — Clinical Social Work Note (Signed)
LCSW following. Pt stable for dc back to [facility] today per MD. Updated [facility]ris at [facility]. Spoke with pt's daughter, Dondra SpryGail, by phone to update. Dondra SpryGail is in agreement. DC clinical sent electronically. Spoke with pt's RN, Annabelle Harmanana, to update. Report to be called to [phone]. Transport form will be printed upstairs. EMS arranged for 12:30 at the earliest.  There are no other CSW needs for dc.

## 2019-02-28 IMAGING — DX DG HIP (WITH OR WITHOUT PELVIS) 2-3V*L*
4 series · 4 of 4 positions shown · non-contrast
Comparison: 11/15/2016 .

CLINICAL DATA: Left hip pain.  Low back pain.  Multiple falls .

EXAM:
DG HIP (WITH OR WITHOUT PELVIS) 2-3V LEFT

[pelvis ap (1 of 2)]
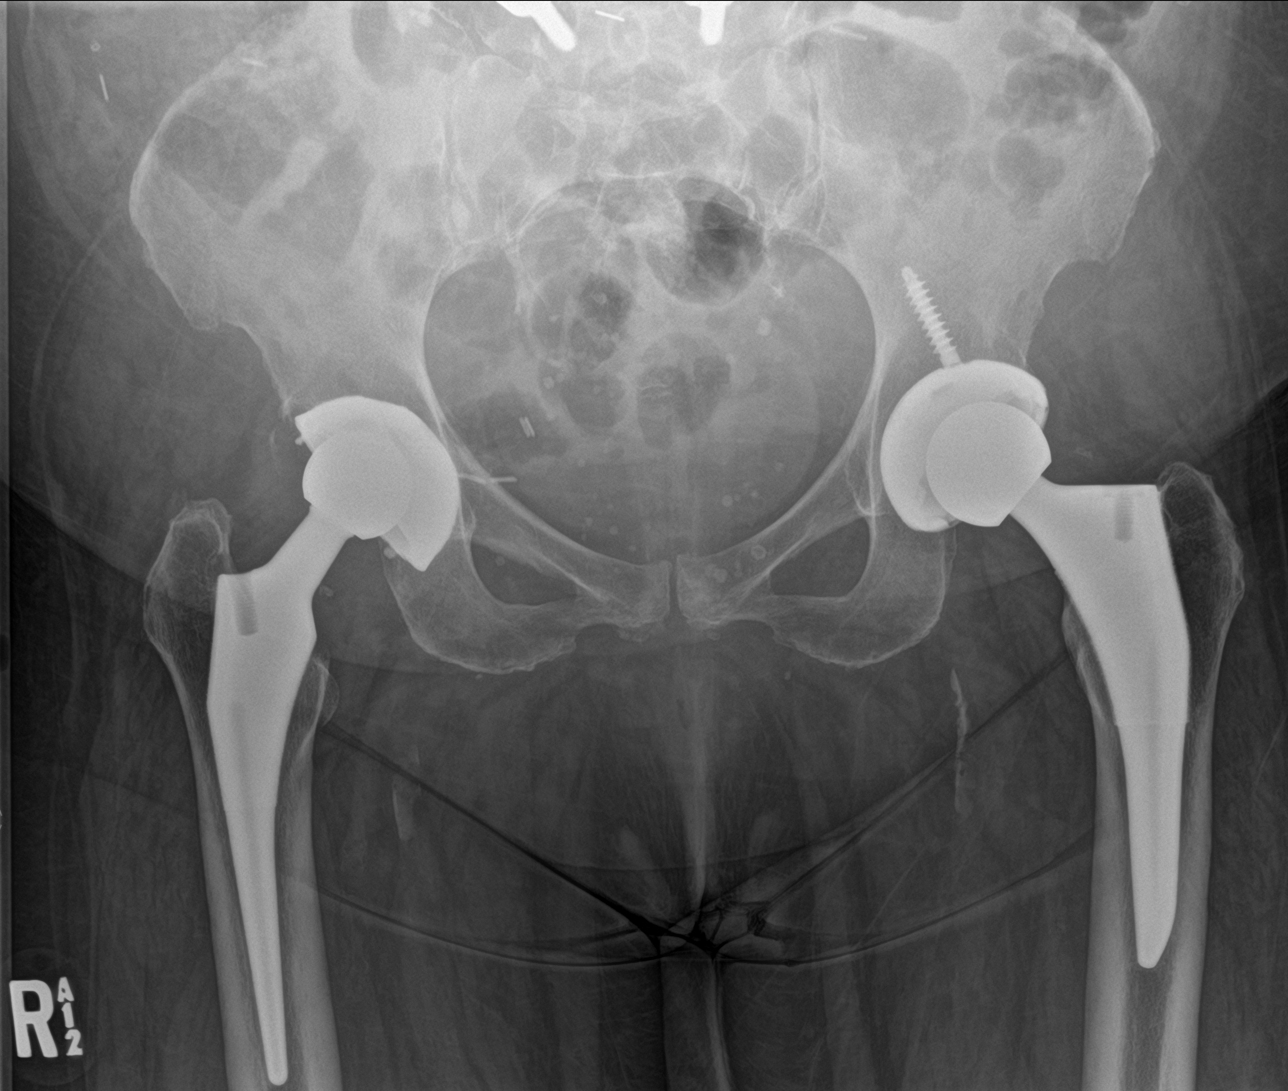

[hip ap]
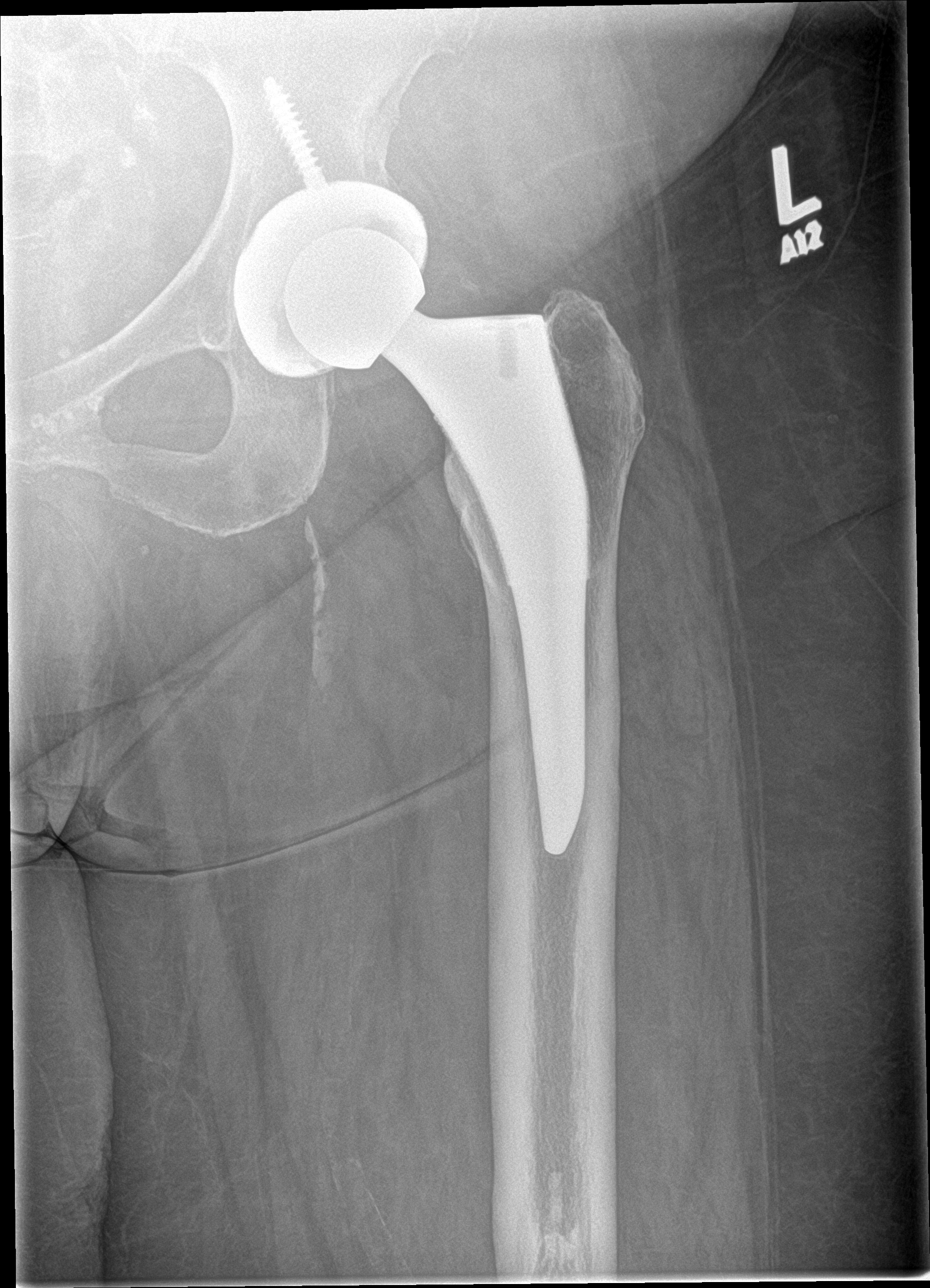

[hip lat]
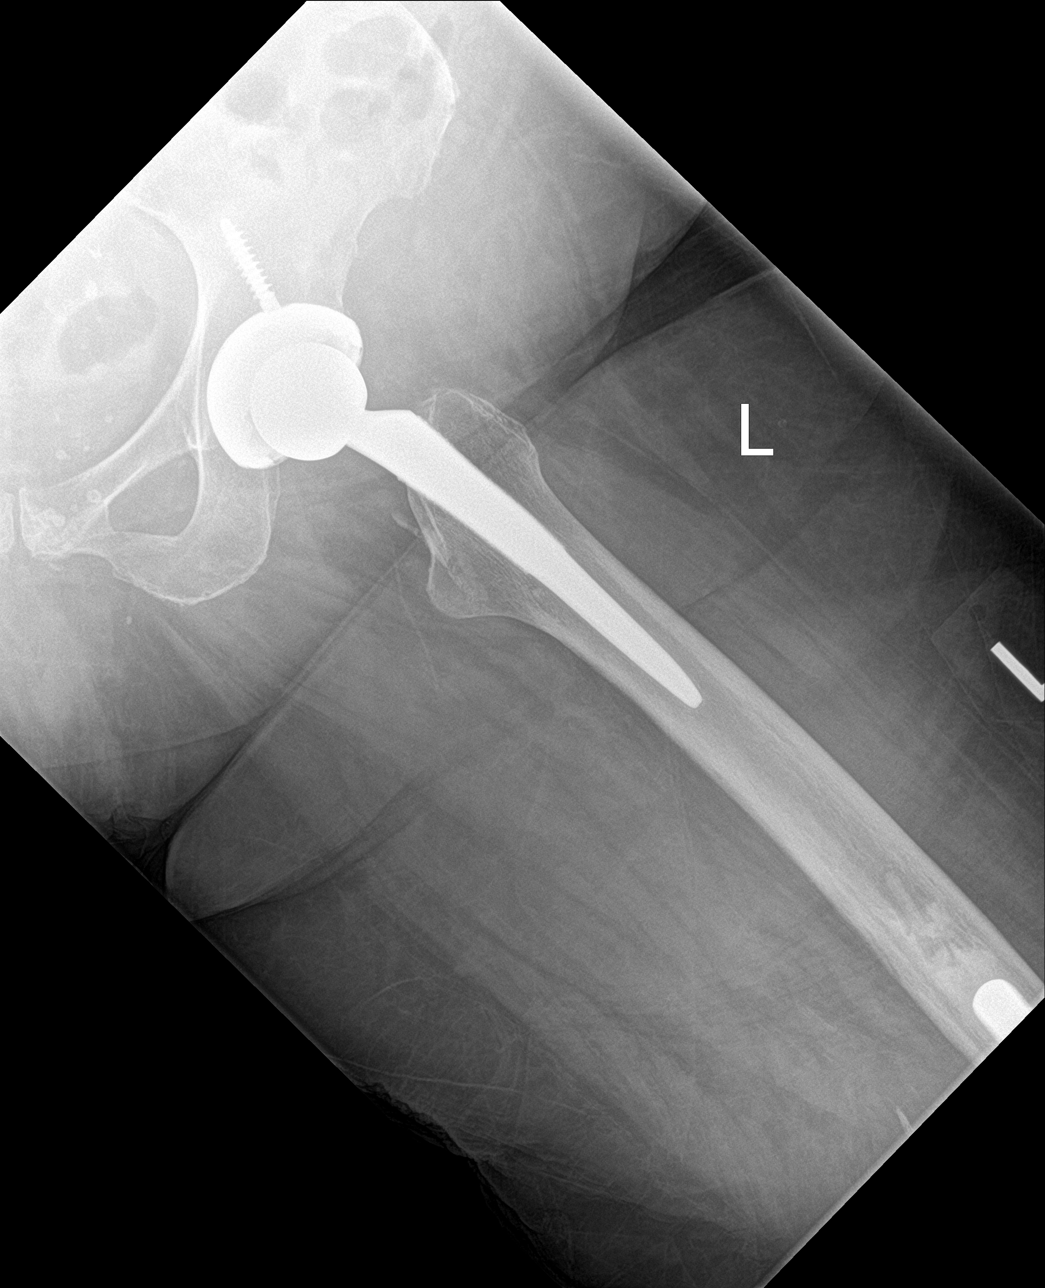

[pelvis ap (2 of 2)]
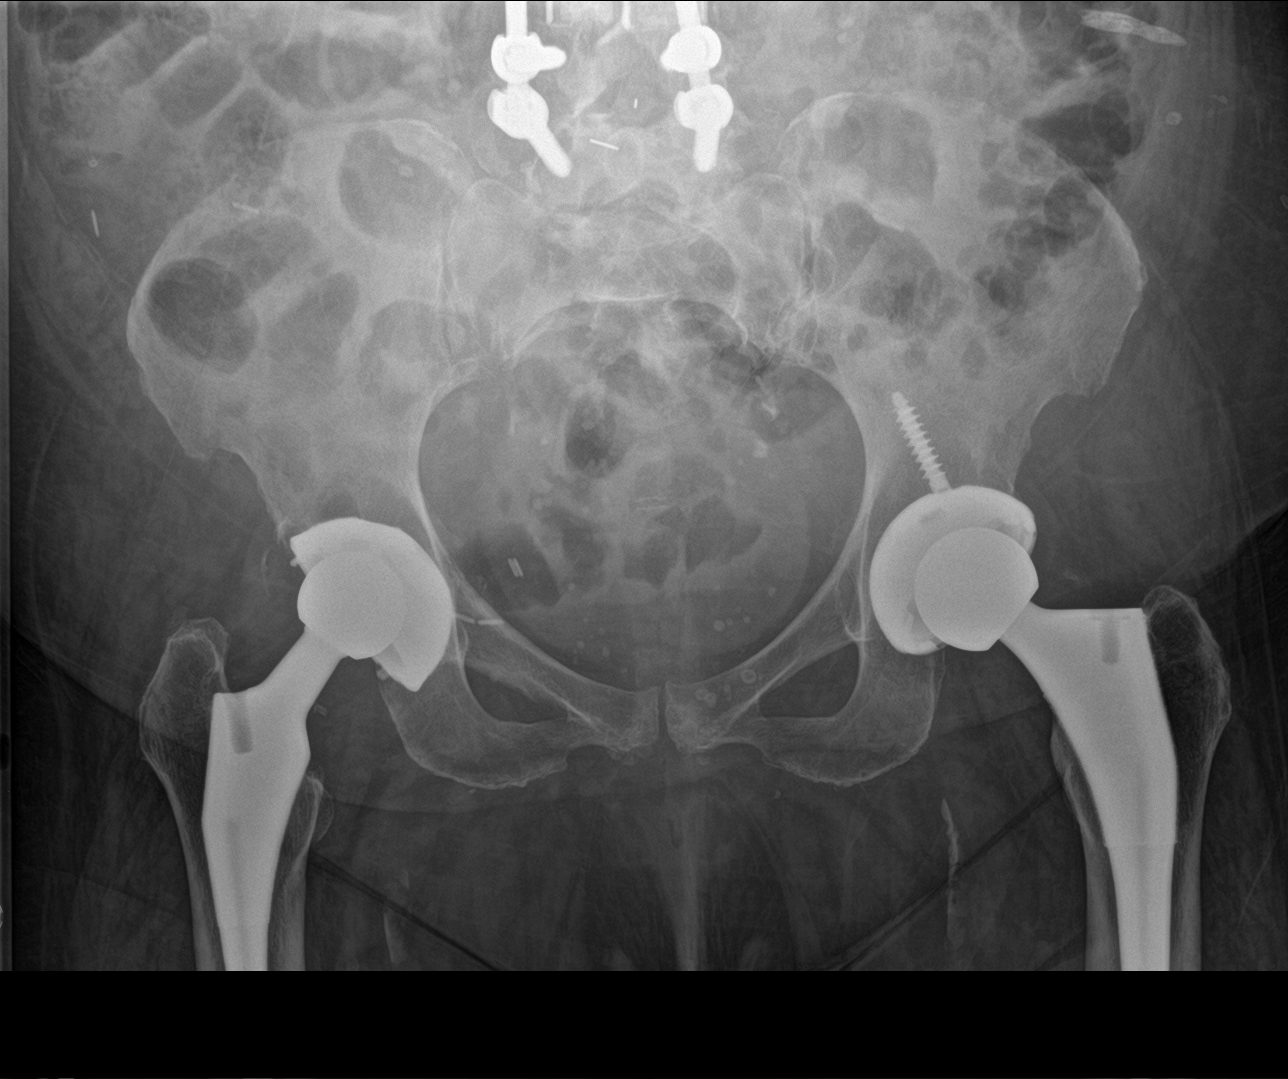

[4 of 4 positions shown; findings below may reference images not displayed]

FINDINGS: Bilateral hip replacements. Hardware intact. Anatomic alignment.
Postsurgical changes left femur. Mild lucency in the distal left
femoral shaft noted. This may be related to prior surgery. Left
ovary series suggest for further evaluation Prior lumbar spine
fusion . No acute bony abnormality identified. Pelvic calcifications
consistent phleboliths. Surgical clips in the pelvis .
IMPRESSION: 1. Bilateral hip replacements. Hardware intact. Anatomic alignment.
Postsurgical changes distal left femur. No acute abnormality .

2. Postsurgical changes distal left femur. Mild lucency in the
femoral shaft noted. This may be related to prior surgery. Left
femur series suggested for further evaluation .

3.  Prior lumbar fusion.

4. Peripheral vascular disease .

## 2019-02-28 IMAGING — DX DG LUMBAR SPINE COMPLETE 4+V
6 series · 6 of 6 positions shown · non-contrast
Comparison: Lumbar spine 07/31/2015 .

CLINICAL DATA: Left hip pain .

EXAM:
LUMBAR SPINE - COMPLETE 4+ VIEW

[l-spine ap]
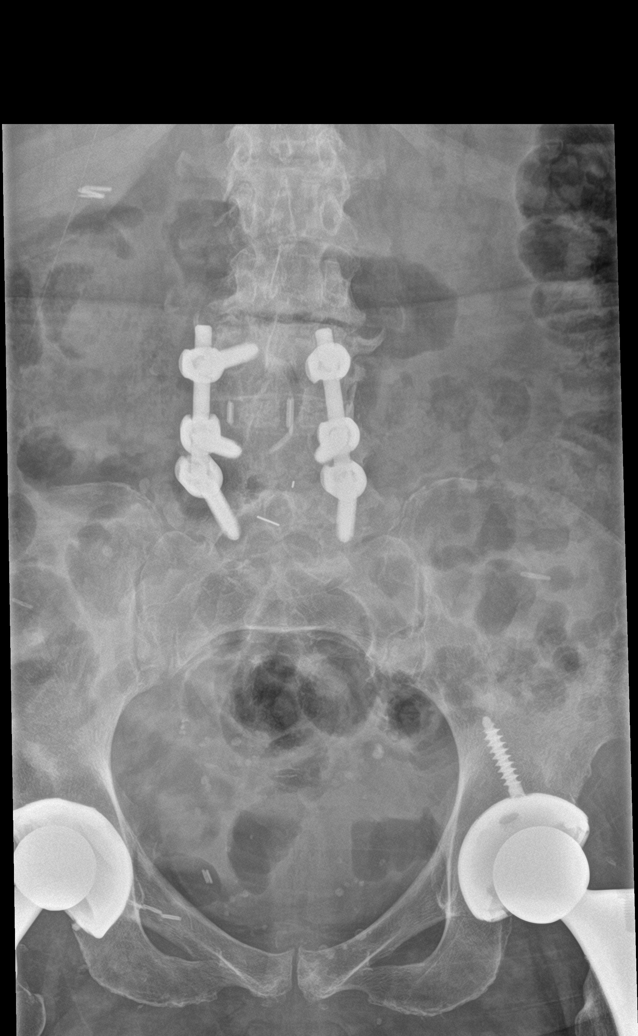

[l-spine lat]
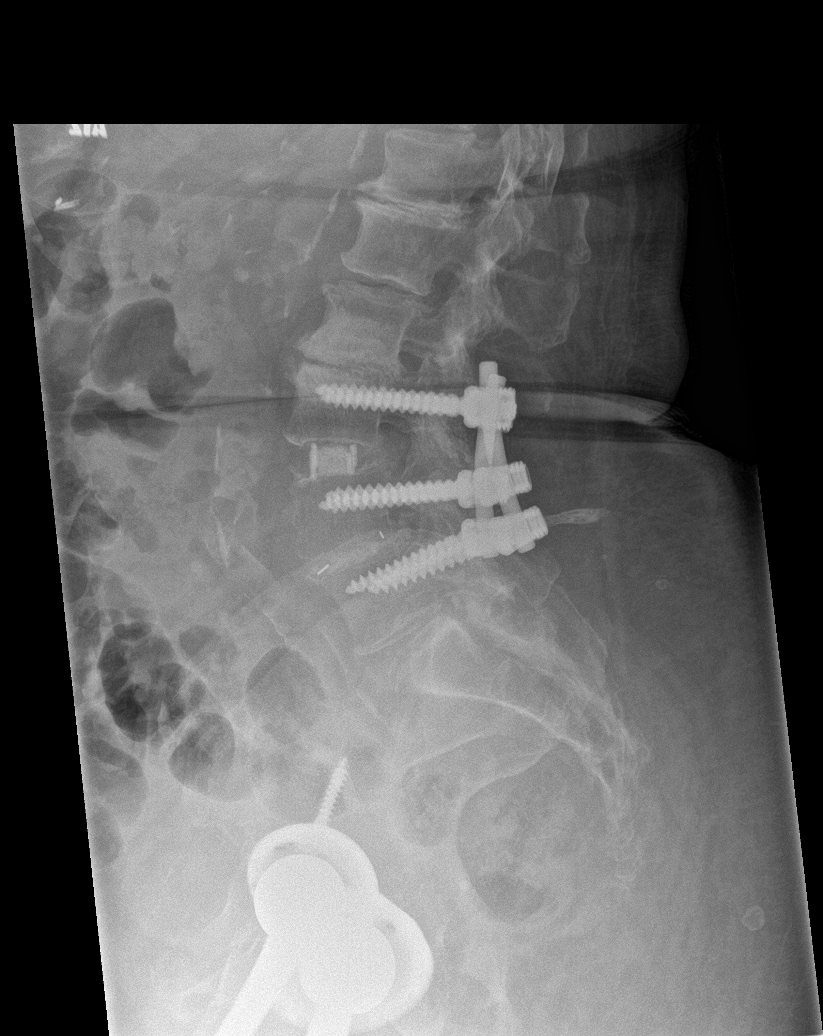

[l-spine spot]
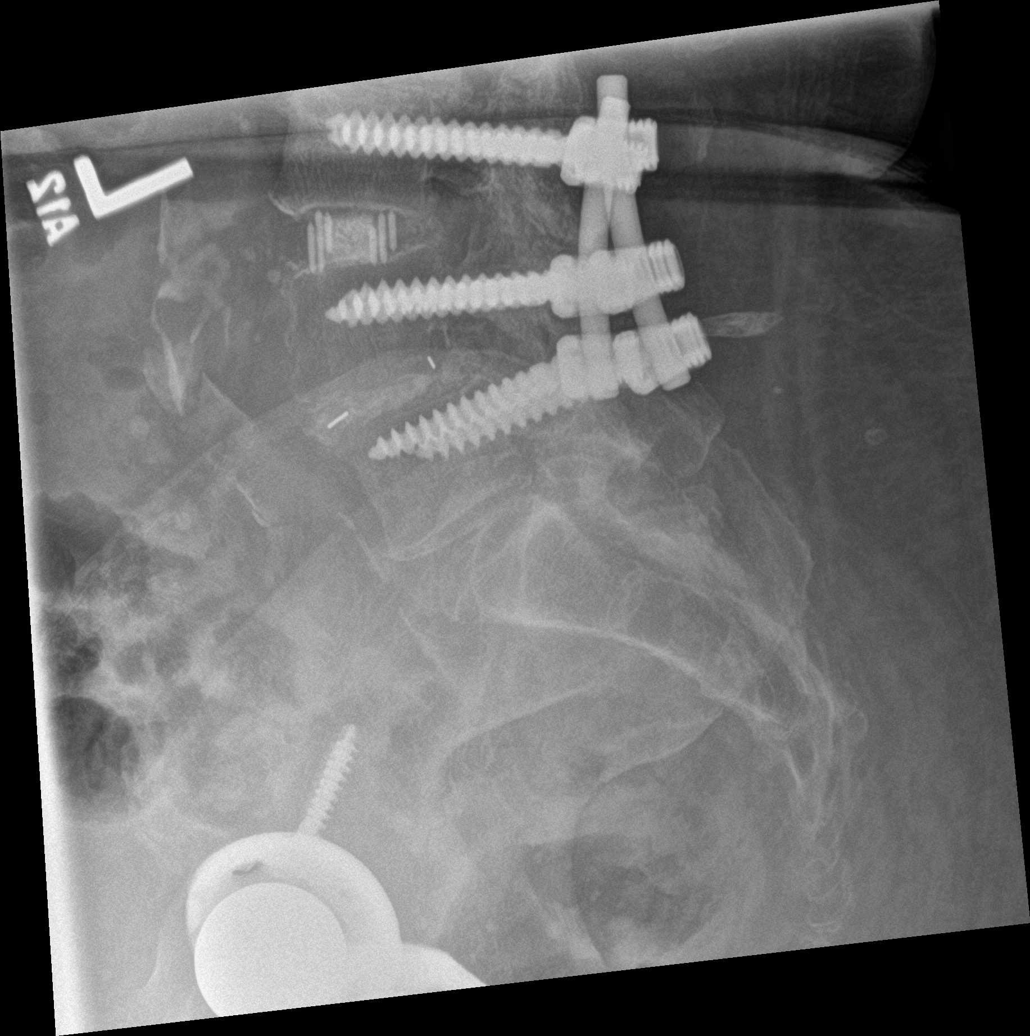

[l-spine obl (1 of 3)]
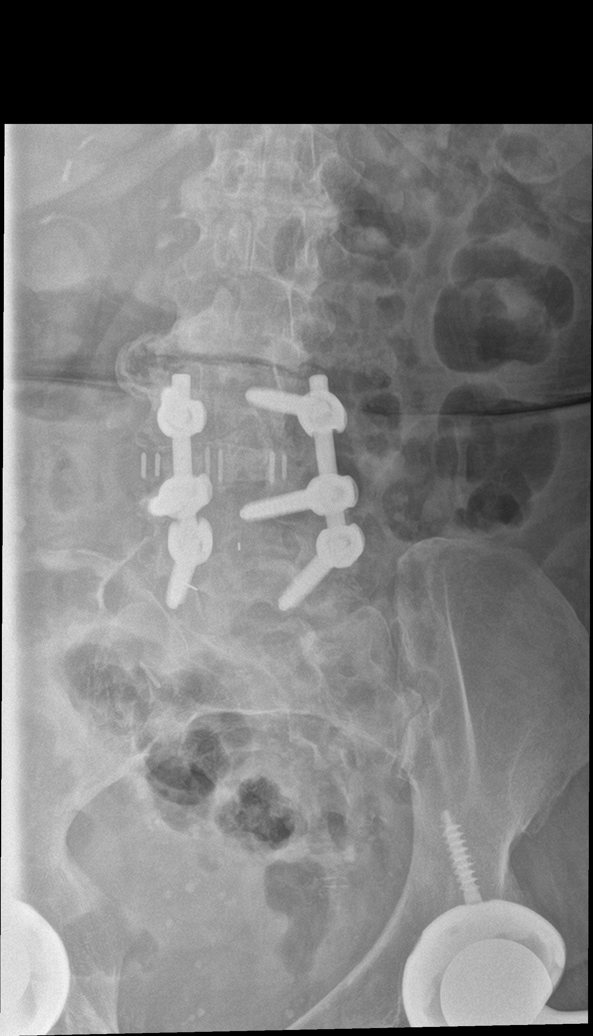

[l-spine obl (2 of 3)]
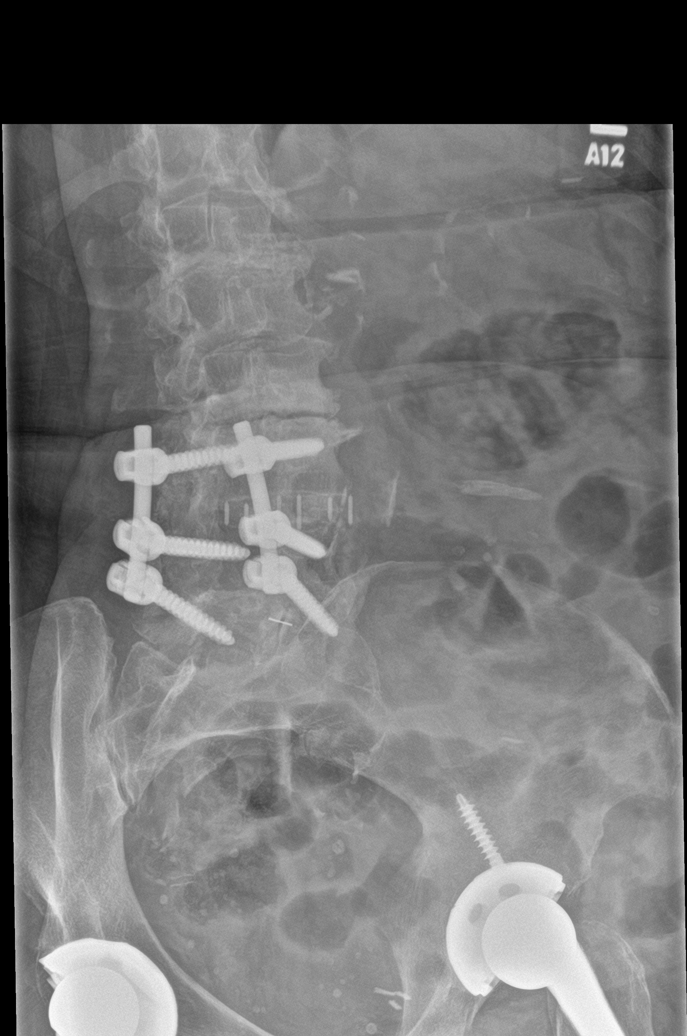

[l-spine obl (3 of 3)]
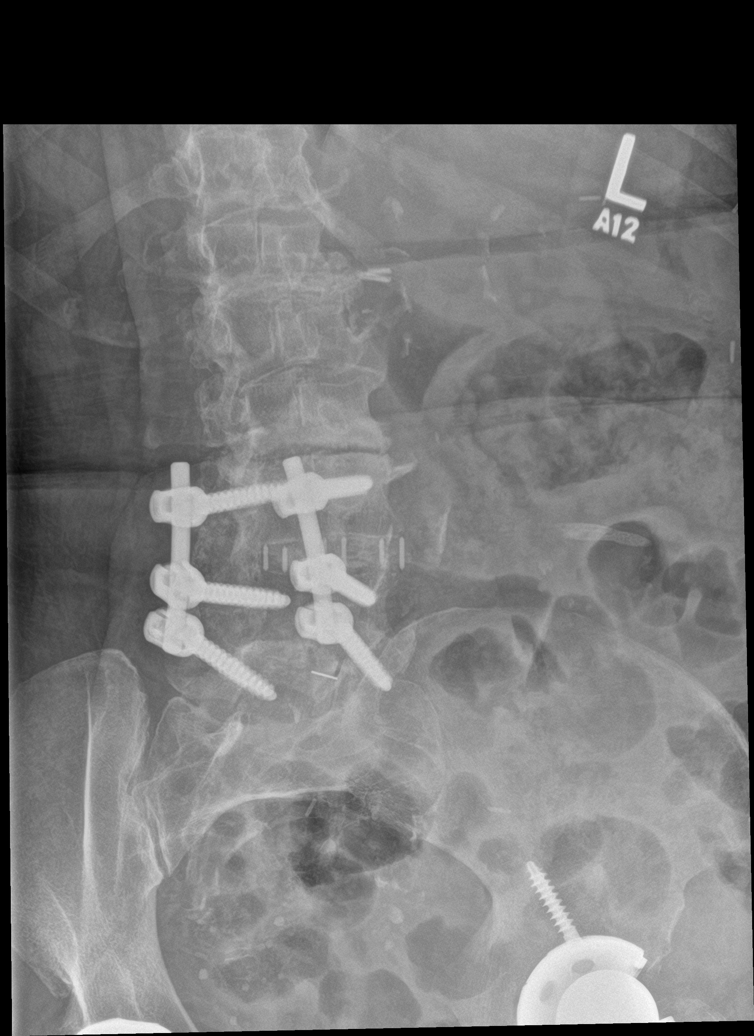

[6 of 6 positions shown; findings below may reference images not displayed]

FINDINGS: Lumbar spine numbered as per prior study of [DATE]/ 3560. L3-L4 and
L4-L5 posterior and interbody fusion. Hardware intact. Lumbar spine
scoliosis concave right. Bilateral hip degenerative change. Surgical
clips right upper quadrant .
IMPRESSION: L3-L4 and L4-L5 posterior and interbody fusion. Hardware intact. No
acute bony abnormality.

## 2019-02-28 IMAGING — DX DG FEMUR 2+V*L*
4 series · 4 of 4 positions shown · non-contrast
Comparison: Left hip series and left knee series [DATE].

CLINICAL DATA: 76 y/o female with left hip and leg pain after
multiple falls in the past week. Prior left lower extremity surgery.

EXAM:
LEFT FEMUR 2 VIEWS

[femur ap (1 of 2)]
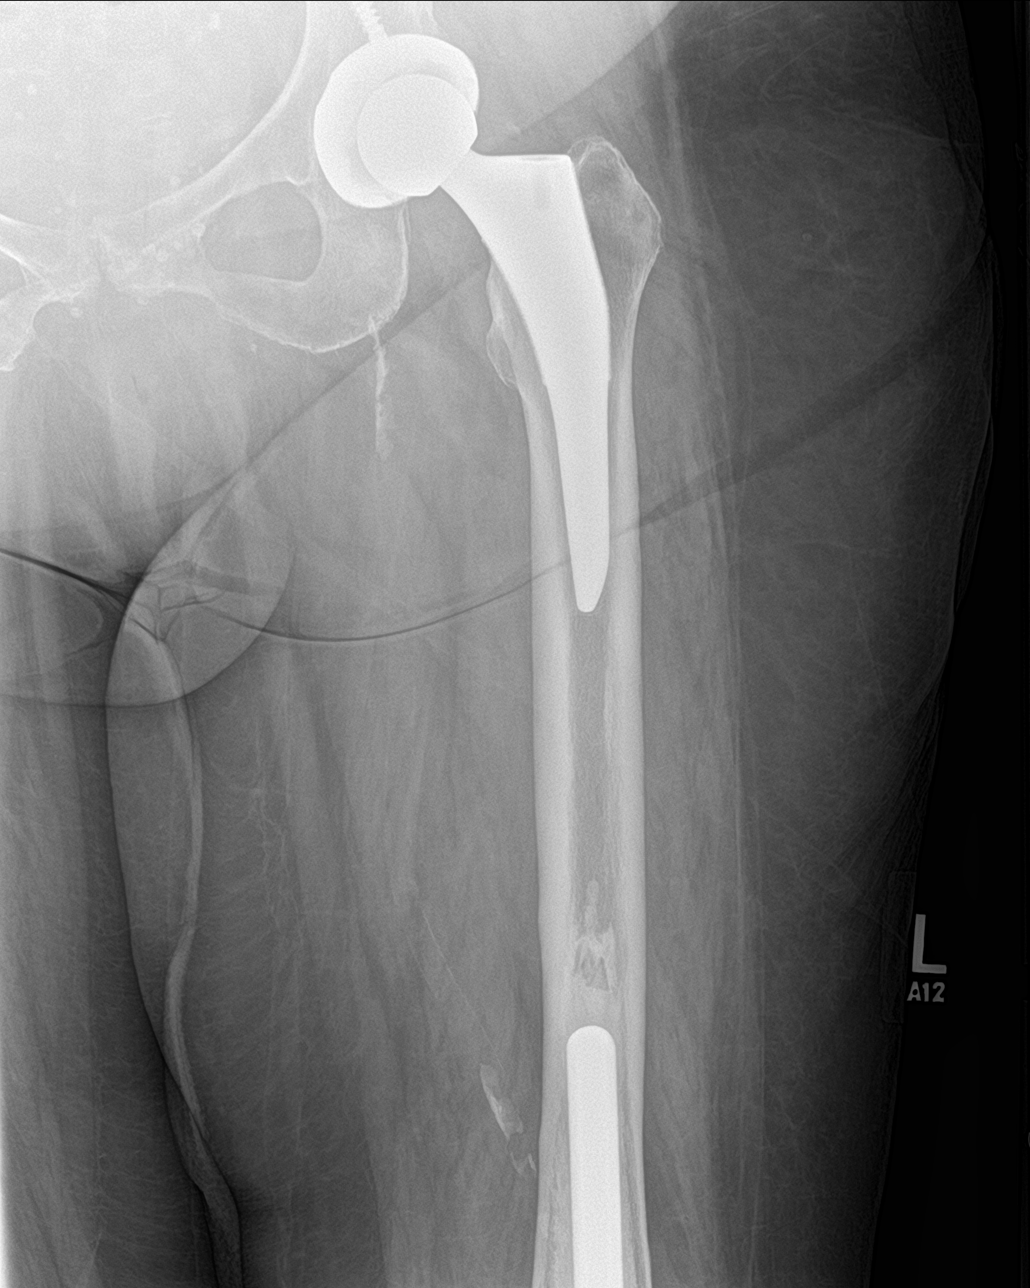

[femur ap (2 of 2)]
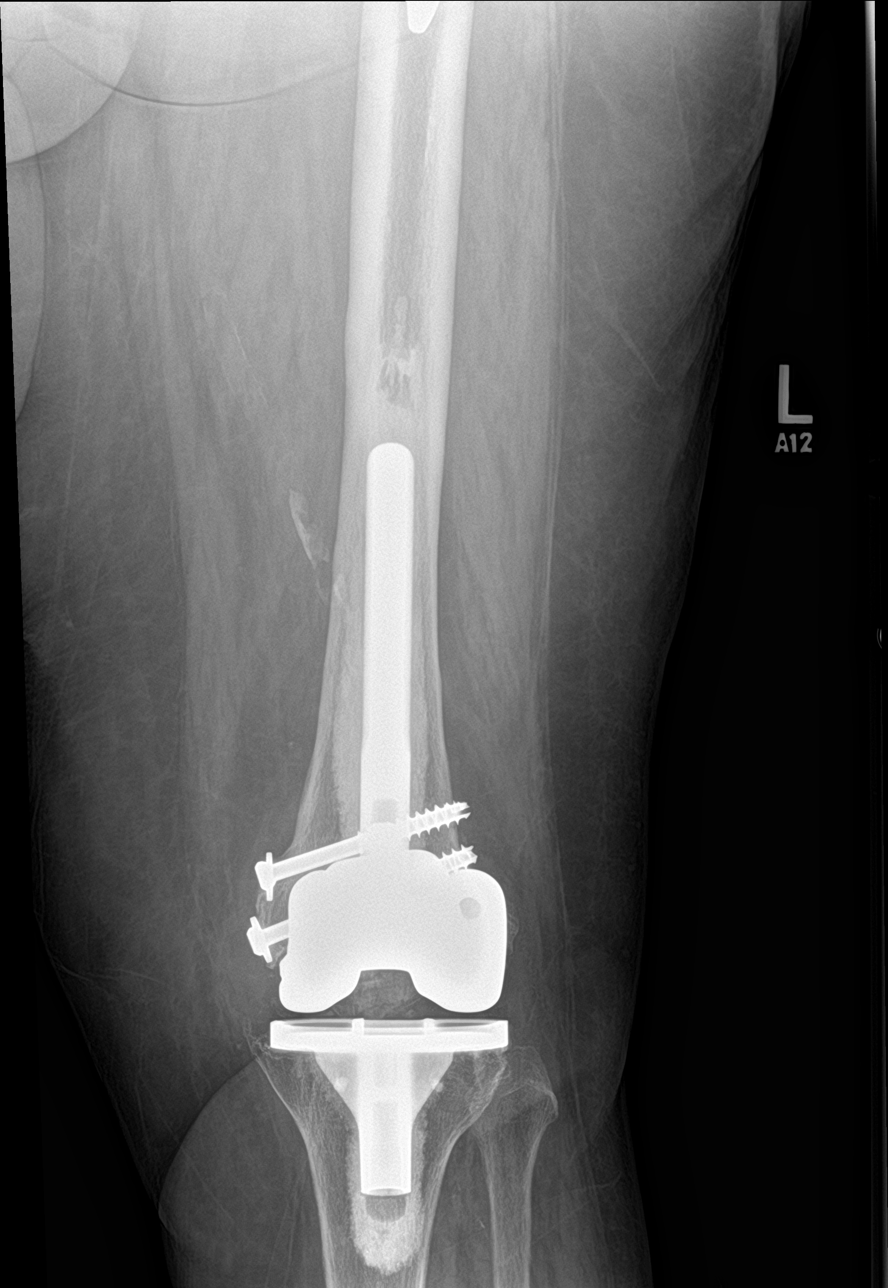

[femur lat (1 of 2)]
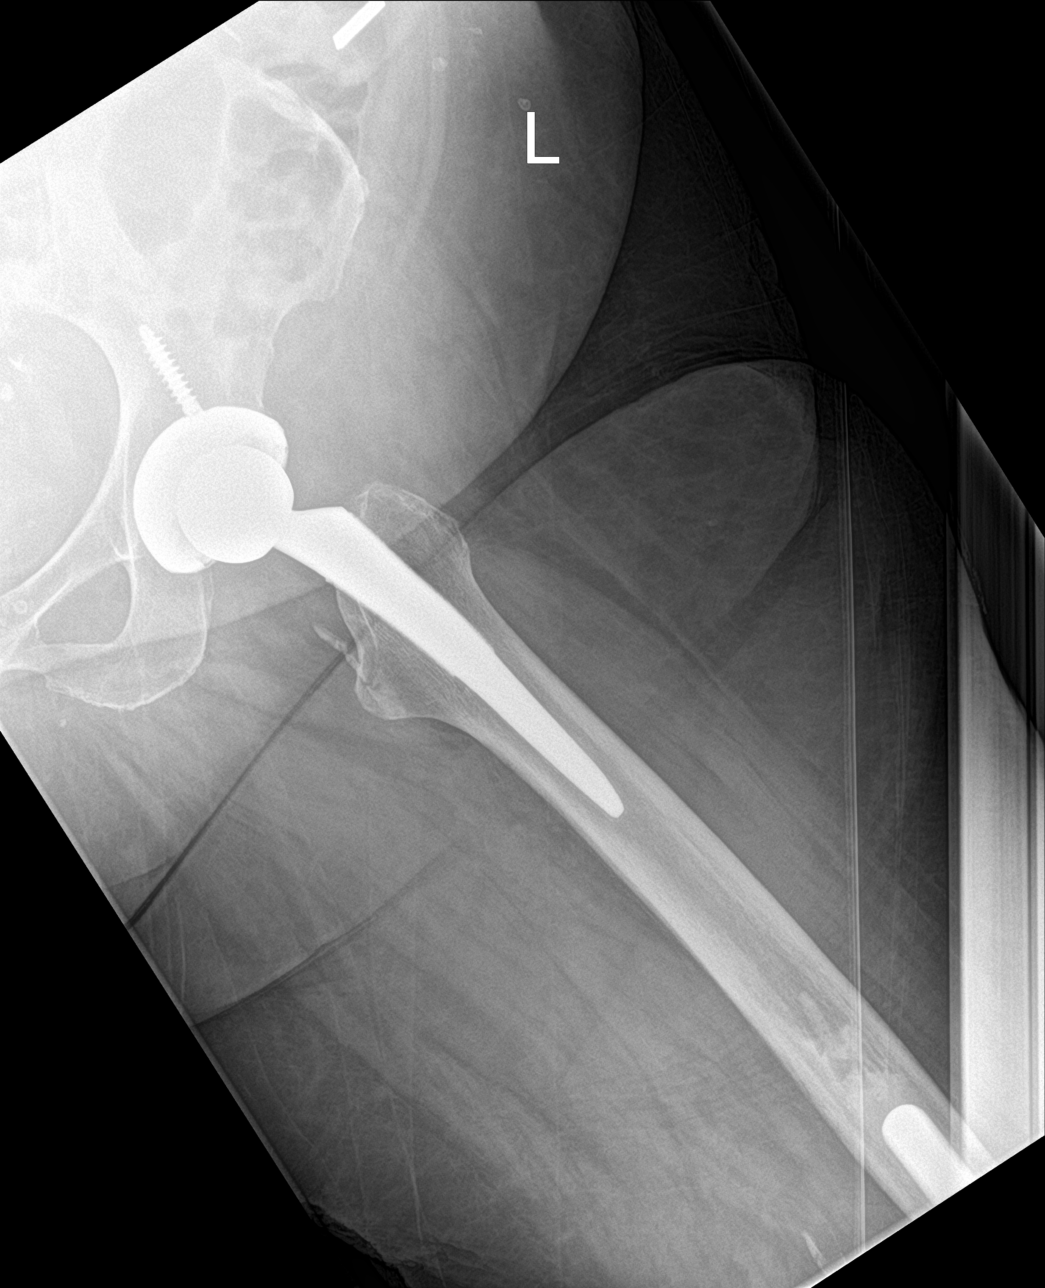

[femur lat (2 of 2)]
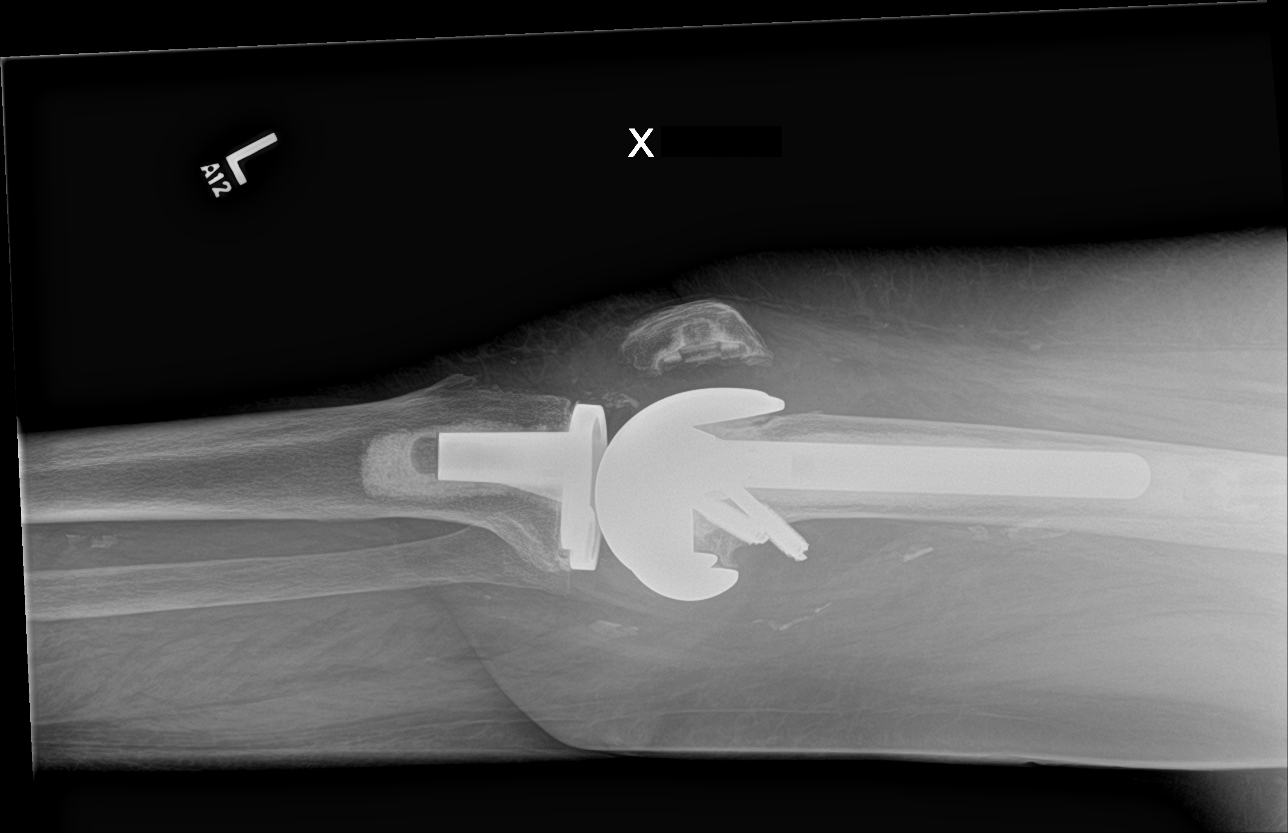

[4 of 4 positions shown; findings below may reference images not displayed]

FINDINGS: Left total hip arthroplasty hardware appears stable and intact. Left
hemipelvis and proximal left femur appears stable and intact. Left
total knee arthroplasty with distal femur intramedullary rod and
cannulated screws. The hardware appears stable since 9133 and
intact. Osteopenia of the visible tibia and fibula. Stable patella.
No acute osseous abnormality identified. Calcified femoral artery
and peripheral vascular atherosclerosis.
IMPRESSION: 1. Stable postoperative appearance of left total hip and left knee
arthroplasty.
2.  No acute osseous abnormality identified.

## 2019-03-18 IMAGING — CT CT HEAD W/O CM
3 series · 16 of 47 positions shown, 19 images · non-contrast
Comparison: 09/24/2016 head CT.

CLINICAL DATA: 76-year-old hypertensive female with confusion. No
known injury. Initial encounter.

EXAM:
CT HEAD WITHOUT CONTRAST
TECHNIQUE: Contiguous axial images were obtained from the base of the skull
through the vertex without intravenous contrast.

[Series 2: head trauma wo · axial · 0.42mm/px · z∈[+9,+134]mm · 10 of 30 slices shown, 13 images]
[im 3/30  brain]
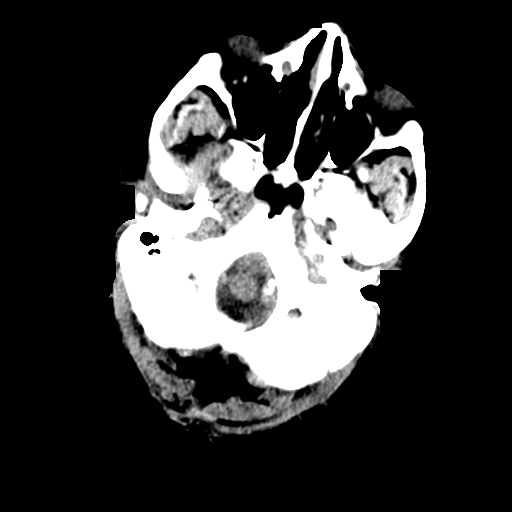
[im 3/30  bone]
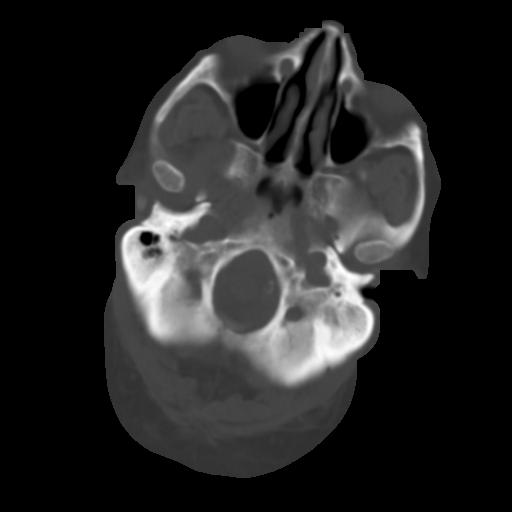
[im 6/30  brain]
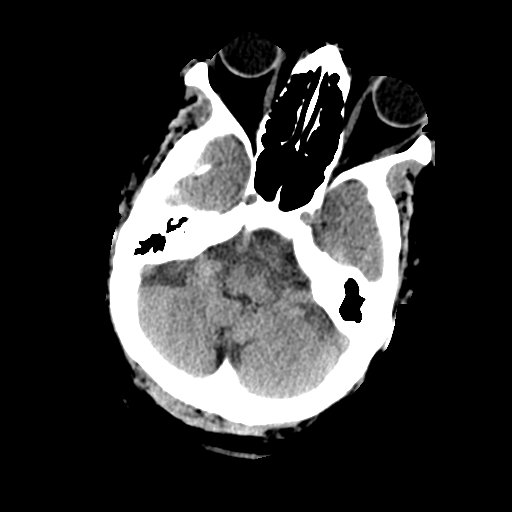
[im 9/30  brain]
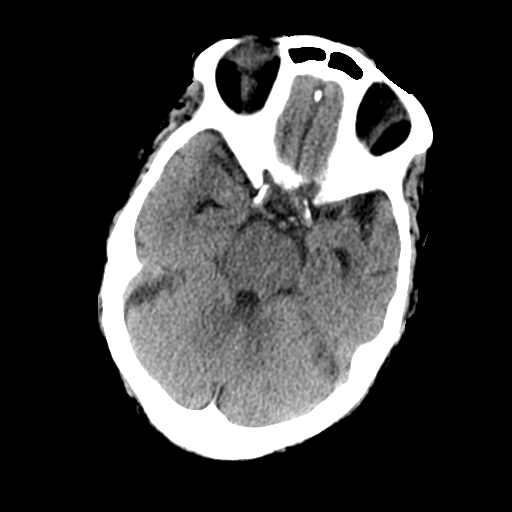
[im 11/30  brain]
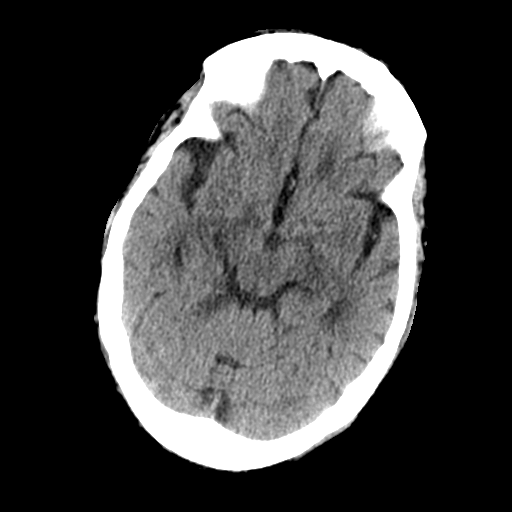
[im 14/30  brain]
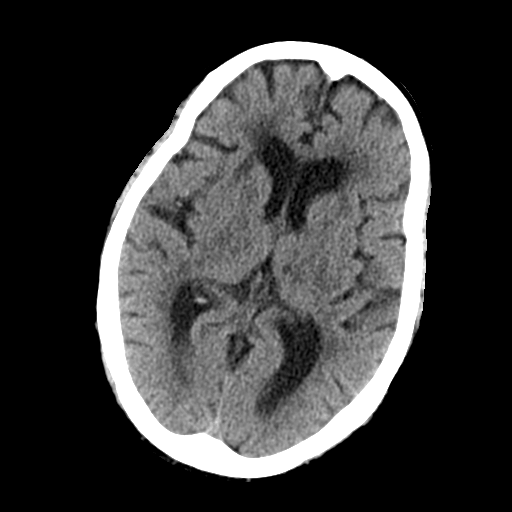
[im 14/30  bone]
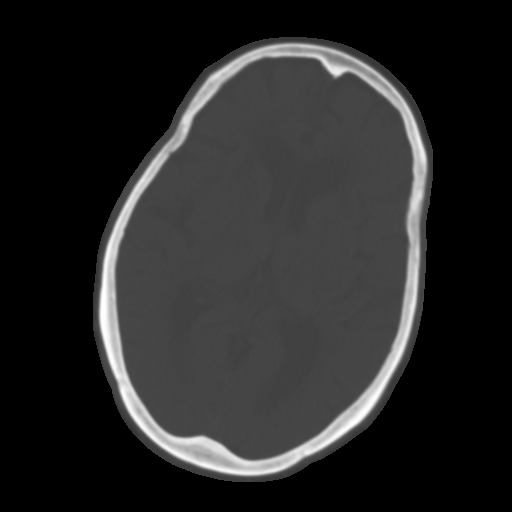
[im 17/30  brain]
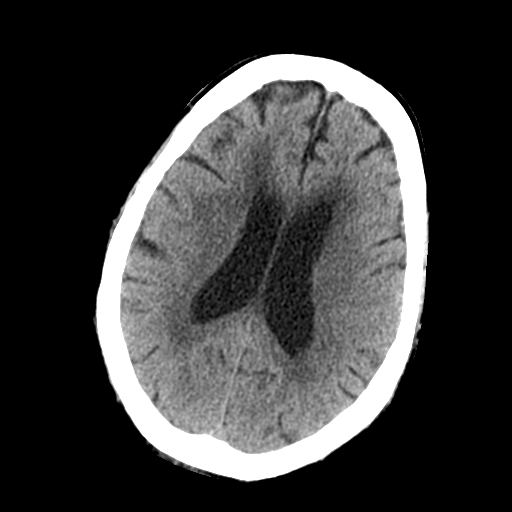
[im 20/30  brain]
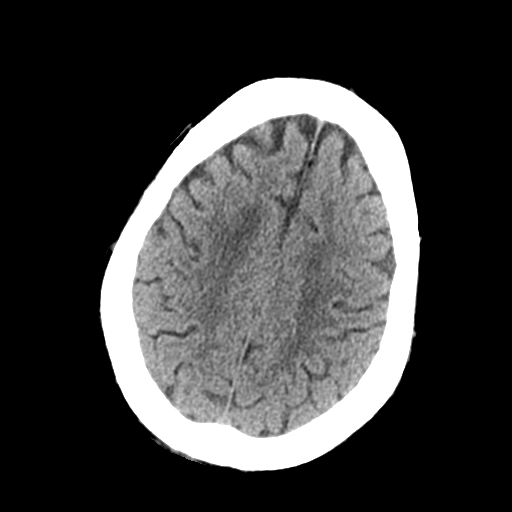
[im 23/30  brain]
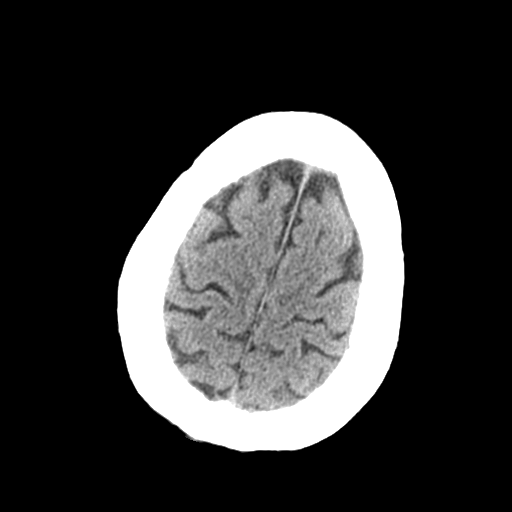
[im 25/30  brain]
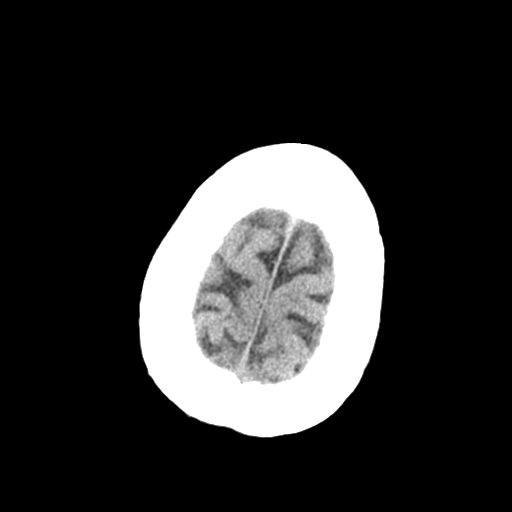
[im 25/30  bone]
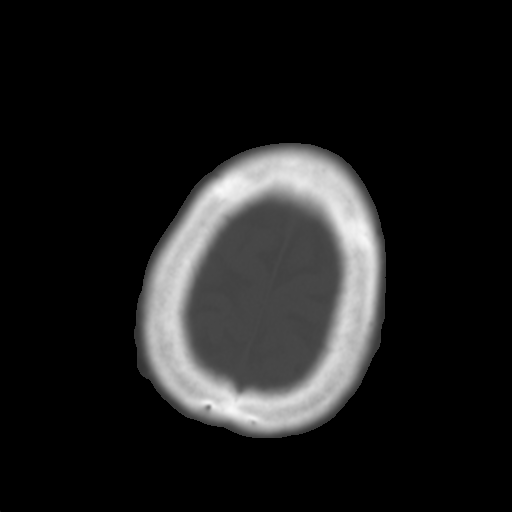
[im 28/30  brain]
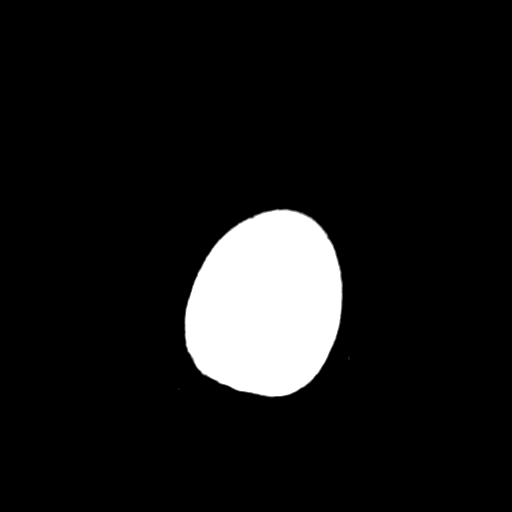

[Series 5: coronal soft tissue · coronal · 0.32mm/px · 3 of 66 slices shown]
[im 22/66  brain]
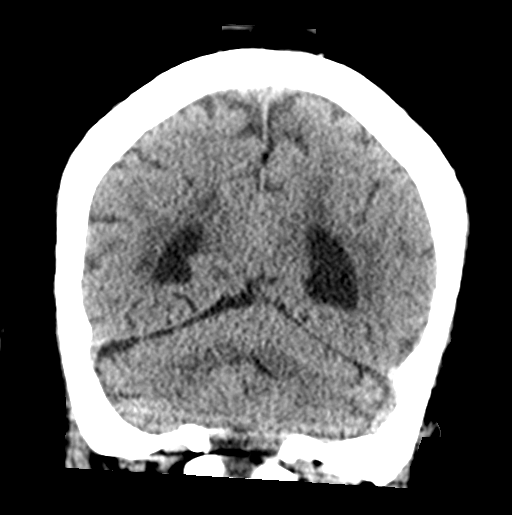
[im 29/66  brain]
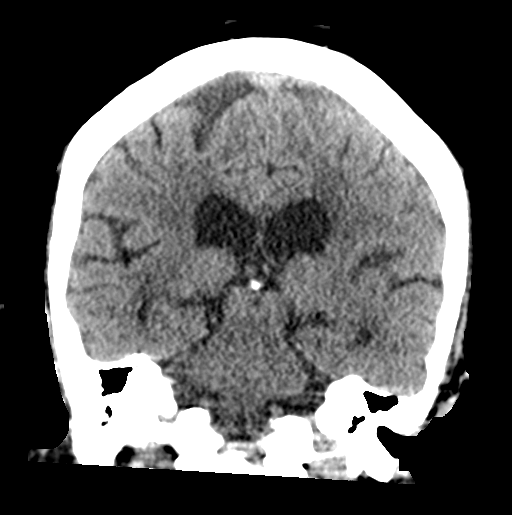
[im 37/66  brain]
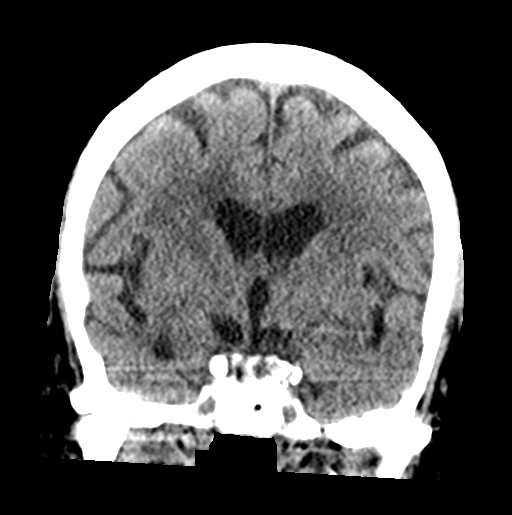

[Series 6: sagittal soft tissue · sagittal · 0.32mm/px · 3 of 53 slices shown]
[im 18/53  brain]
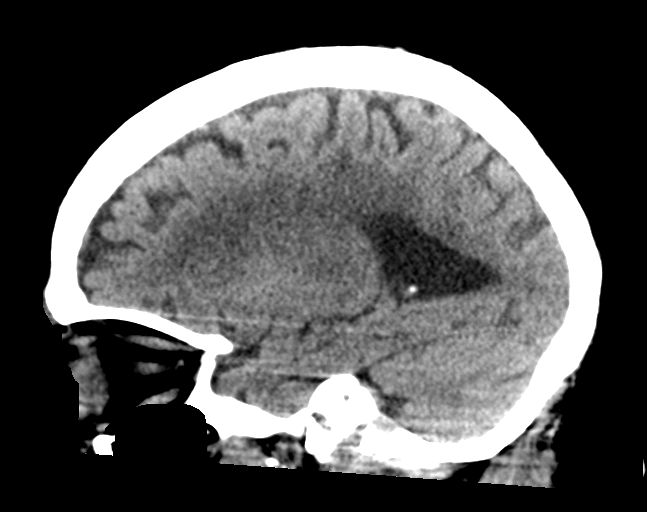
[im 27/53  brain]
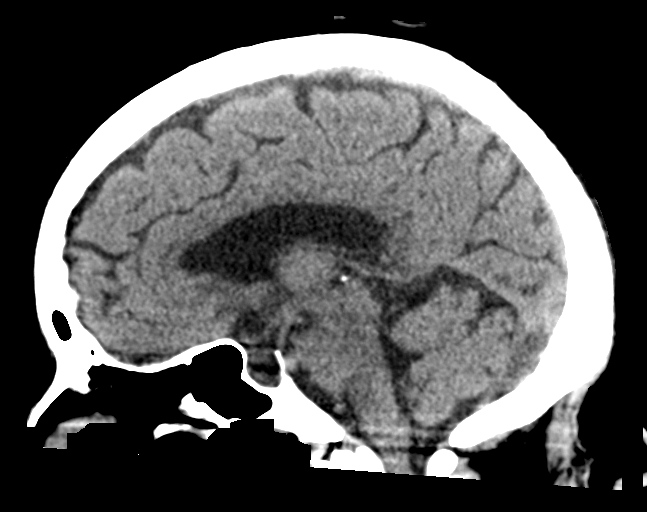
[im 35/53  brain]
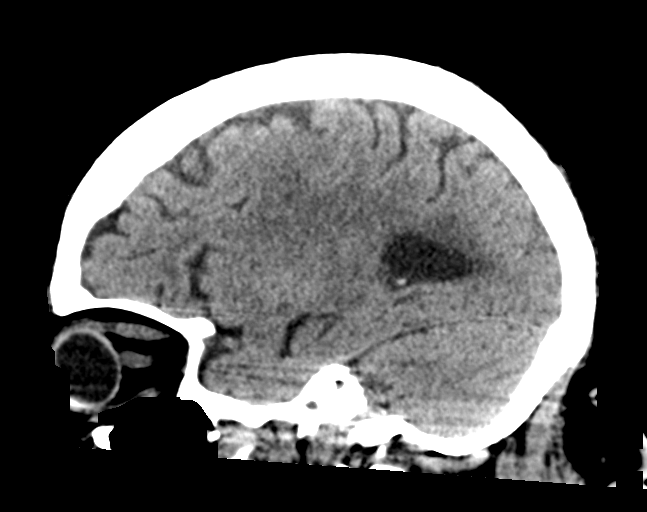

[16 of 47 positions shown; findings below may reference images not displayed]

FINDINGS: Brain: No intracranial hemorrhage or CT evidence of large acute
infarct.

Remote left thalamic infarct.

Moderate chronic microvascular changes.

Global atrophy without hydrocephalus.

No intracranial mass lesion noted on this unenhanced exam.

Vascular: Prominent vascular calcifications.

Skull: No acute abnormality.

Sinuses/Orbits: Exophthalmos. Visualized paranasal sinuses are
clear.

Other: Mastoid air cells and middle ear cavities are clear.
IMPRESSION: No intracranial hemorrhage or CT evidence of large acute infarct.

Remote left thalamic infarct.

Moderate chronic microvascular changes.

Global atrophy.

## 2019-03-18 IMAGING — DX DG CHEST 2V
2 series · 2 of 2 positions shown · non-contrast
Comparison: Chest x-ray of 08/29/2016

CLINICAL DATA: Increasing level of confusion over the last 3 days,
cough

EXAM:
CHEST  2 VIEW

[chest lat]
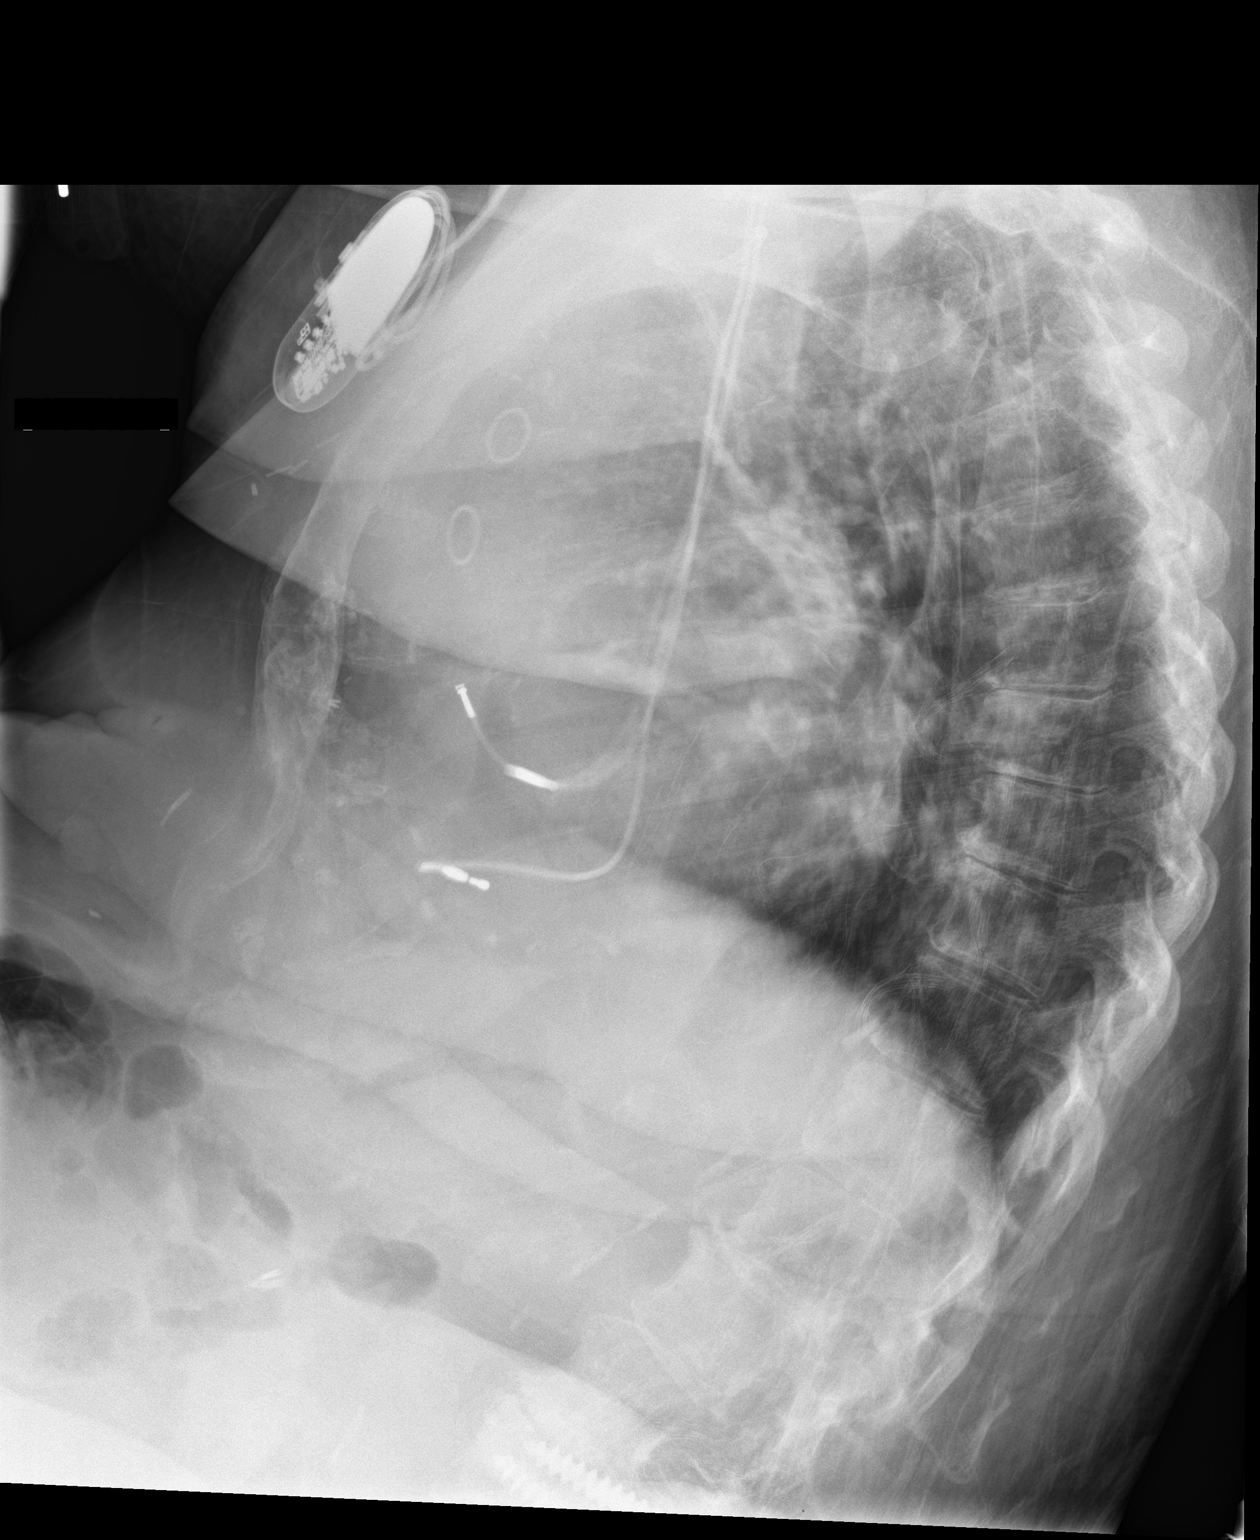

[chest ap]
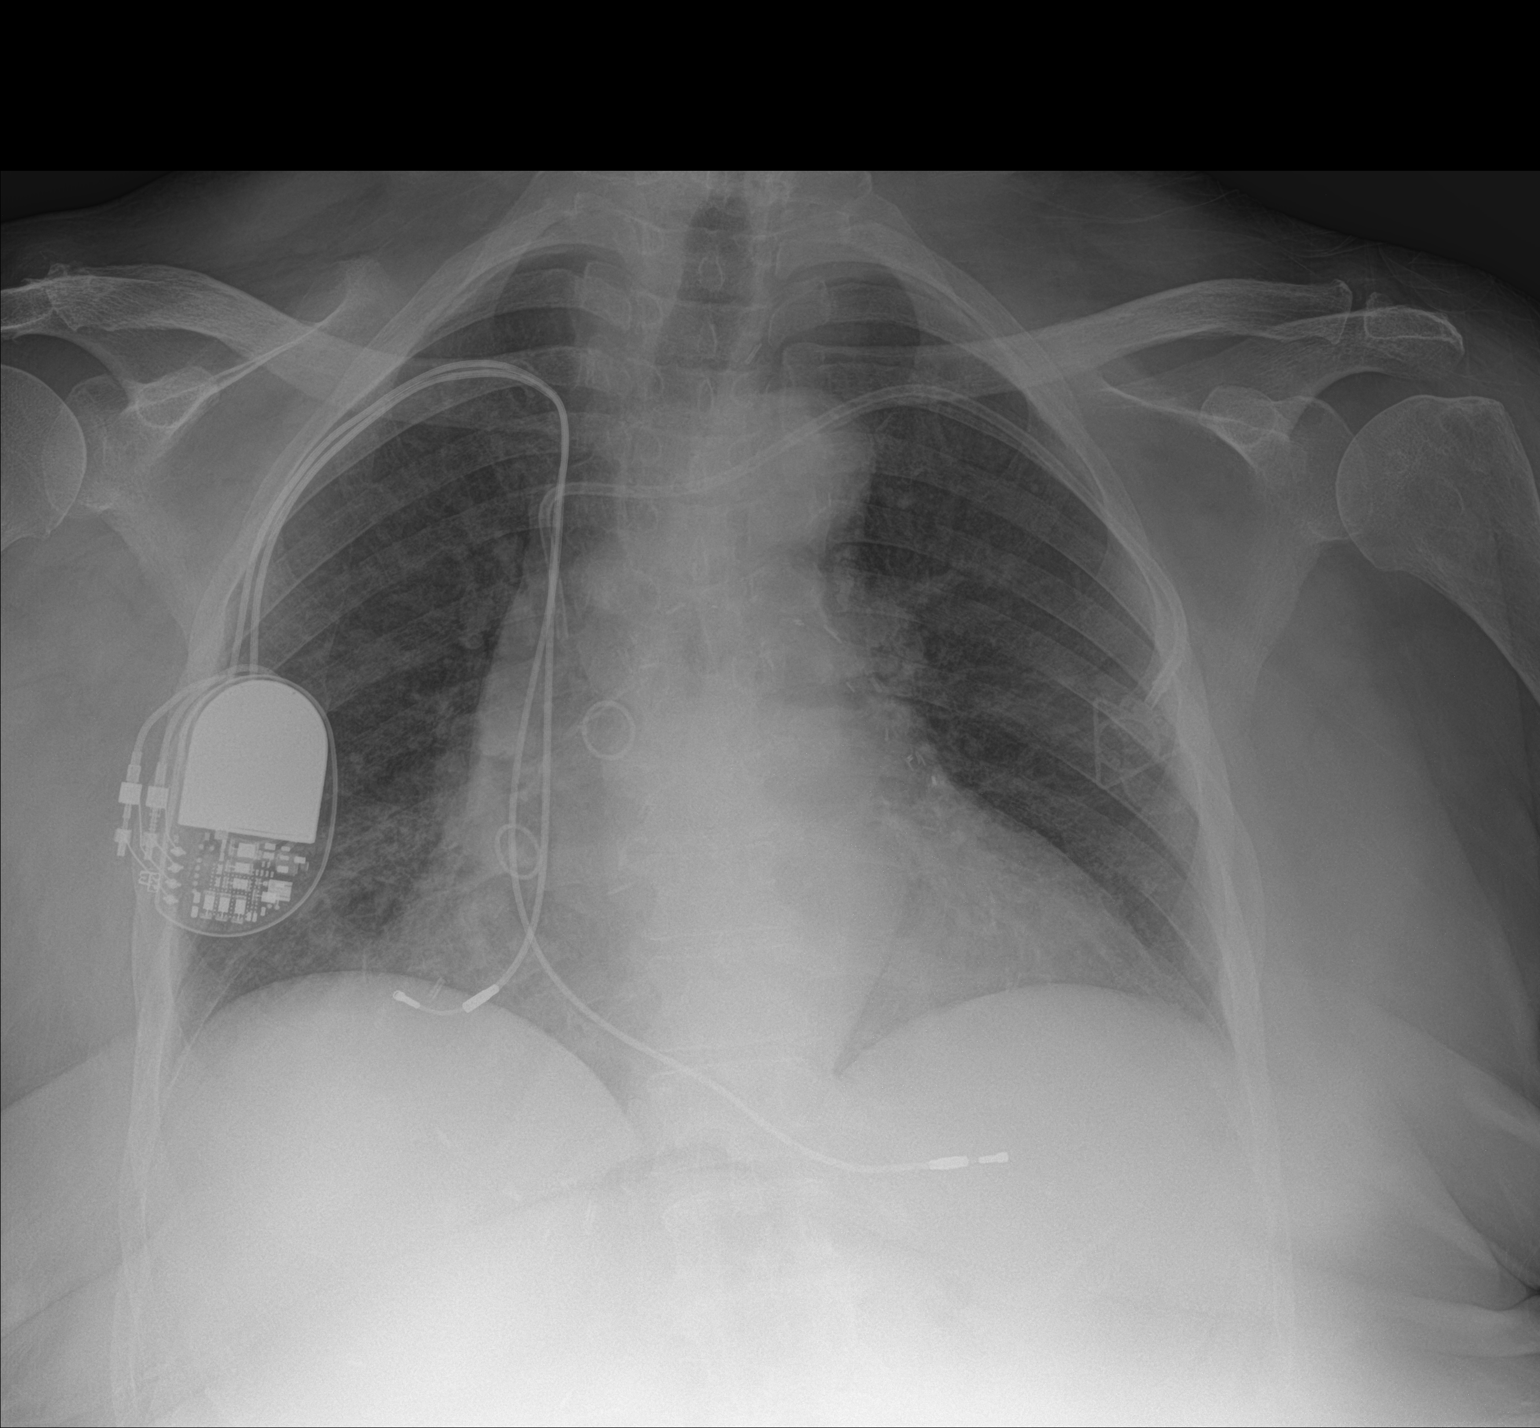

[2 of 2 positions shown; findings below may reference images not displayed]

FINDINGS: There are slightly prominent markings at the right lung base
suspicious for developing pneumonia. The left lung appears clear. No
pleural effusion is seen. Moderate cardiomegaly is stable. Permanent
pacemaker remains. Changes of prior CABG are noted.
IMPRESSION: Slightly prominent markings at the right lung base suspicious for
developing pneumonia. Recommend followup.

## 2019-03-25 IMAGING — CT CT HEAD W/O CM
3 series · 15 of 47 positions shown, 18 images · non-contrast
Comparison: 09/19/2017, 09/24/2016 and earlier.

CLINICAL DATA: 76-year-old discharged from the hospital yesterday
with a diagnosis of pneumonia, presenting today with worsening
confusion and increasing weakness.

EXAM:
CT HEAD WITHOUT CONTRAST
TECHNIQUE: Contiguous axial images were obtained from the base of the skull
through the vertex without intravenous contrast.

[Series 2: head wo · axial · 0.45mm/px · z∈[+34,+159]mm · 9 of 30 slices shown, 12 images]
[im 3/30  brain]
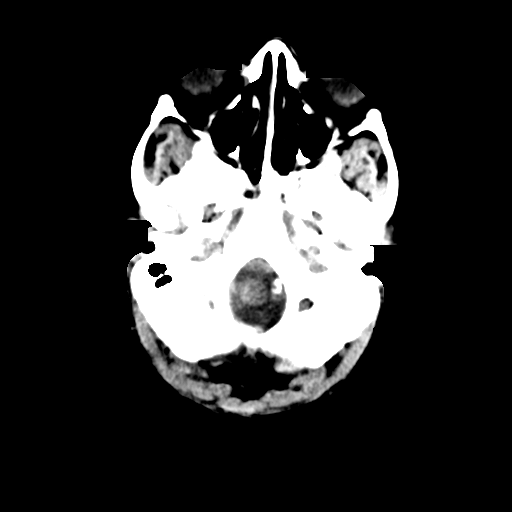
[im 3/30  bone]
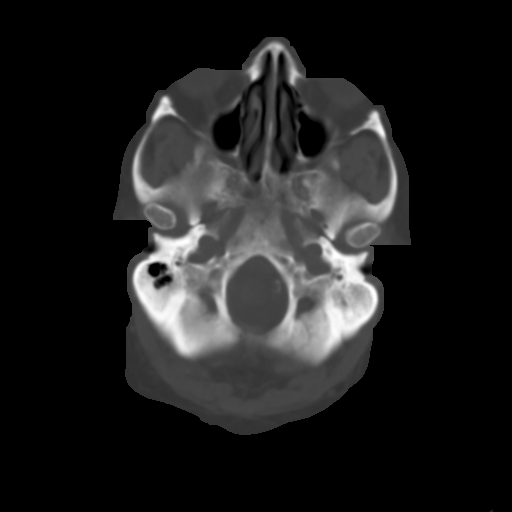
[im 6/30  brain]
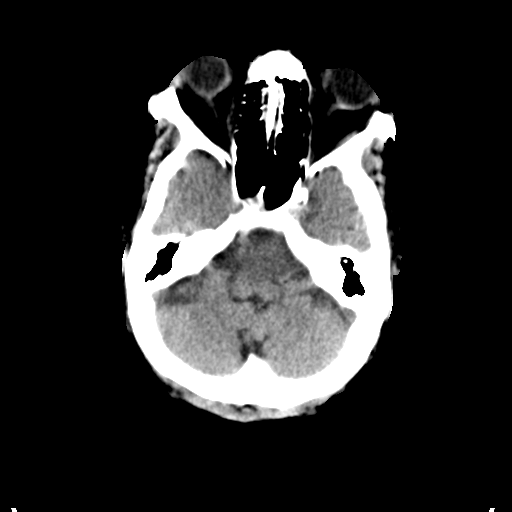
[im 9/30  brain]
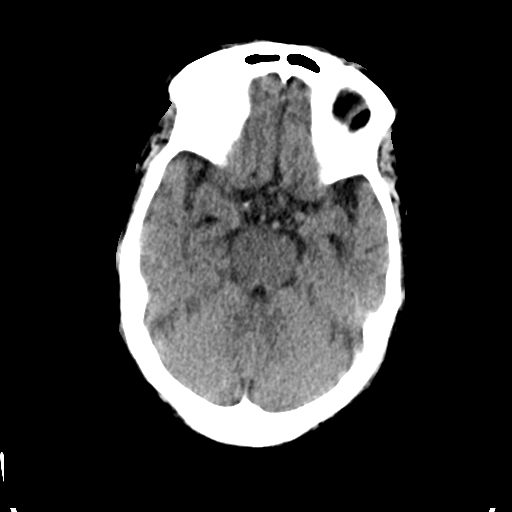
[im 12/30  brain]
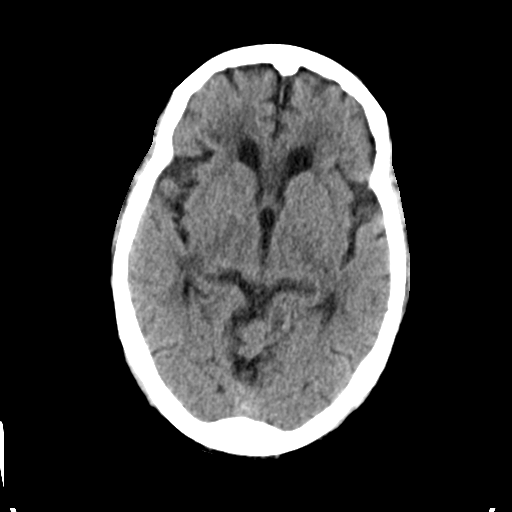
[im 16/30  brain]
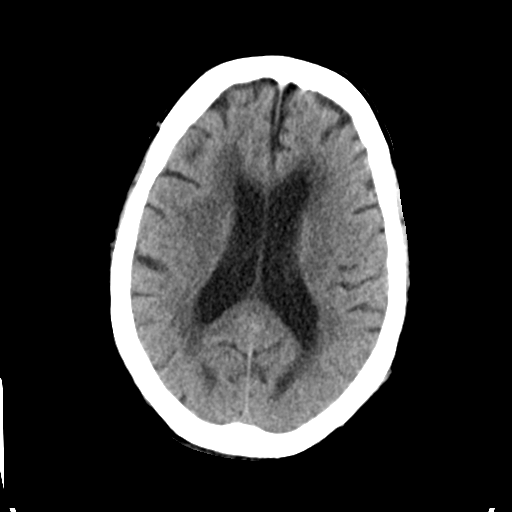
[im 16/30  bone]
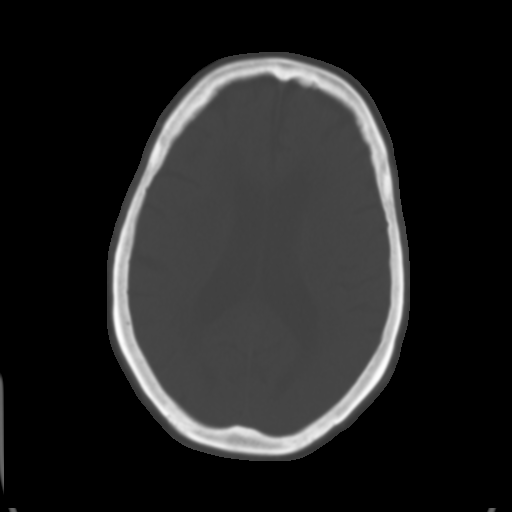
[im 19/30  brain]
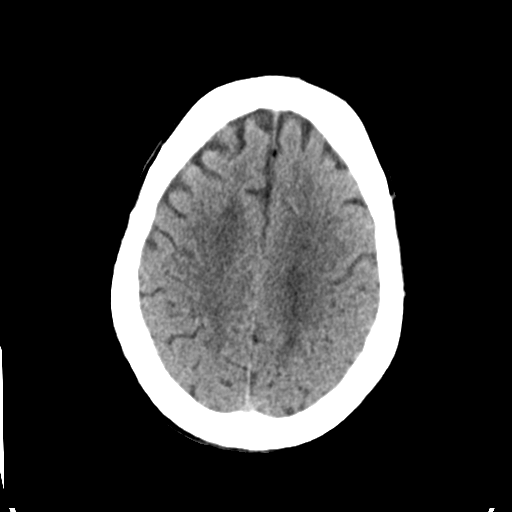
[im 22/30  brain]
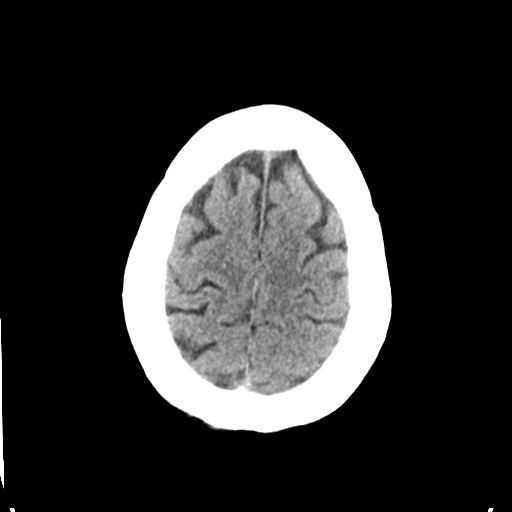
[im 25/30  brain]
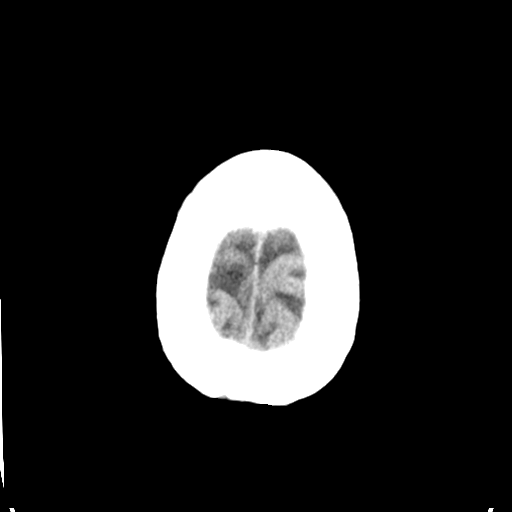
[im 28/30  brain]
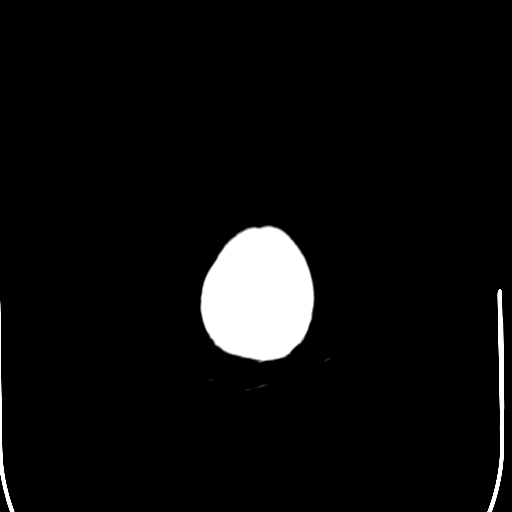
[im 28/30  bone]
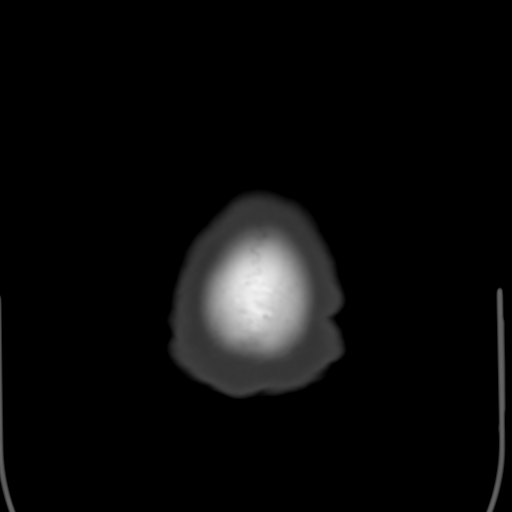

[Series 4: coronal soft tissue · coronal · 0.31mm/px · 3 of 67 slices shown]
[im 23/67  brain]
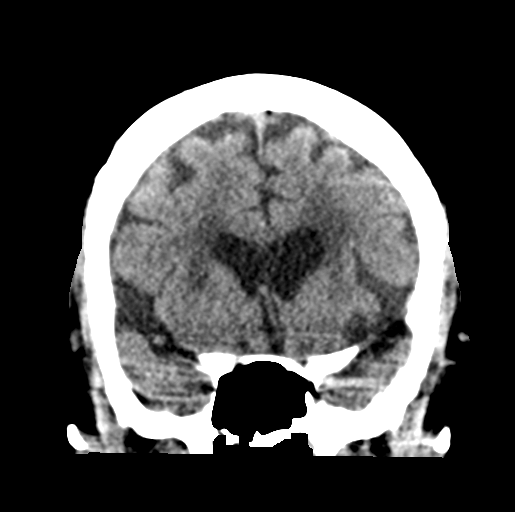
[im 30/67  brain]
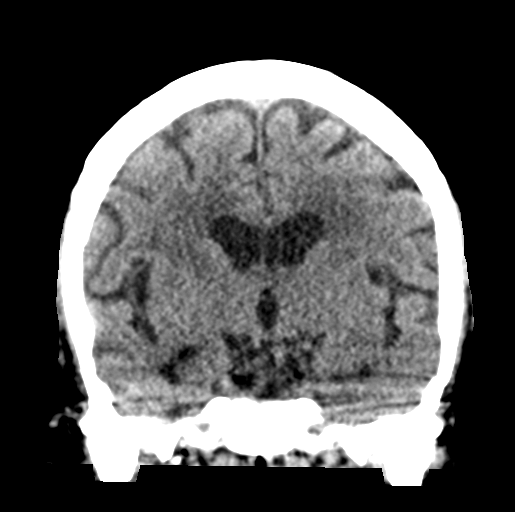
[im 37/67  brain]
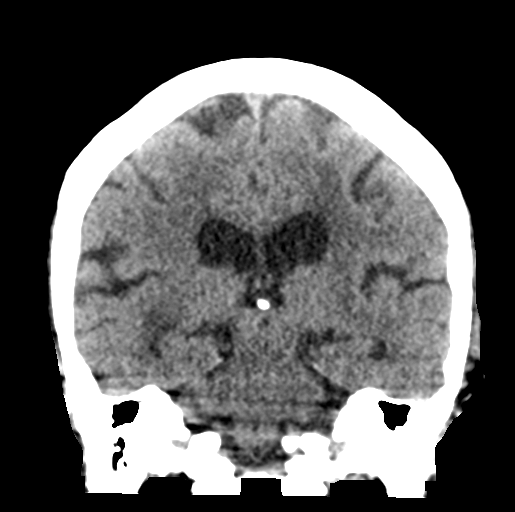

[Series 5: sagittal soft tissue · sagittal · 0.33mm/px · 3 of 50 slices shown]
[im 17/50  brain]
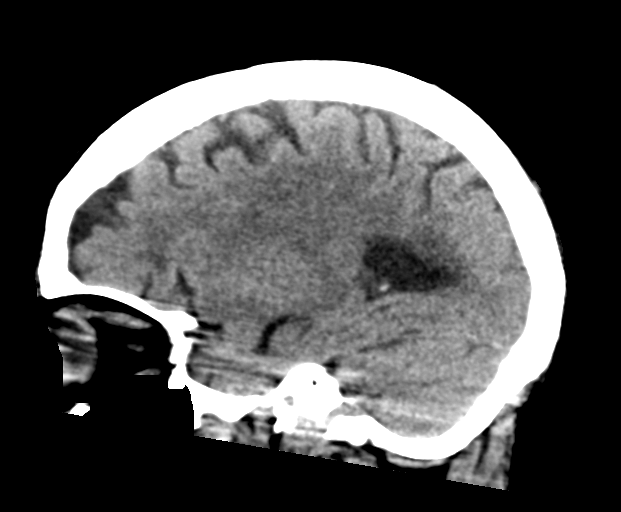
[im 25/50  brain]
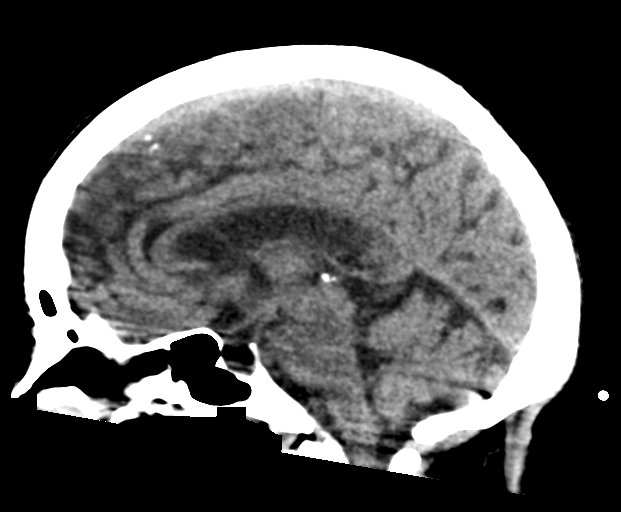
[im 33/50  brain]
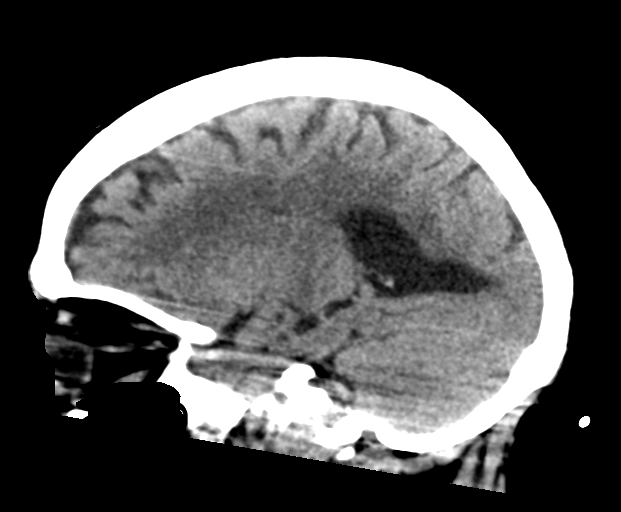

[15 of 47 positions shown; findings below may reference images not displayed]

FINDINGS: Patient motion degraded images of the skull base.

Brain: Mild cortical atrophy and moderate deep atrophy, unchanged
dating back to 4416. Moderate to severe changes of small vessel
disease of the white matter diffusely, unchanged. Old lacunar type
strokes in the right basal ganglia, unchanged. No mass lesion. No
midline shift. No acute hemorrhage or hematoma. No extra-axial fluid
collections. No evidence of acute infarction.

Vascular: Severe atherosclerosis involving the left vertebral
artery. Severe bilateral carotid siphon atherosclerosis. No
hyperdense vessel.

Skull: No skull fracture or other focal osseous abnormality
involving the skull.

Sinuses/Orbits: Visualized paranasal sinuses, bilateral mastoid air
cells and bilateral middle ear cavities well-aerated. Visualized
orbits and globes are normal.

Other: None.
IMPRESSION: 1. No acute intracranial abnormality.
2. Stable generalized atrophy and moderate to severe chronic
microvascular ischemic changes of the white matter.

## 2019-03-25 IMAGING — CR DG CHEST 1V PORT
1 series · 1 of 1 positions shown · non-contrast
Comparison: PA and lateral chest 08/29/2016 and 09/19/2017.

CLINICAL DATA: Weakness. The patient was discharged from the
hospital yesterday after being treated for pneumonia.

EXAM:
PORTABLE CHEST 1 VIEW

[portable]
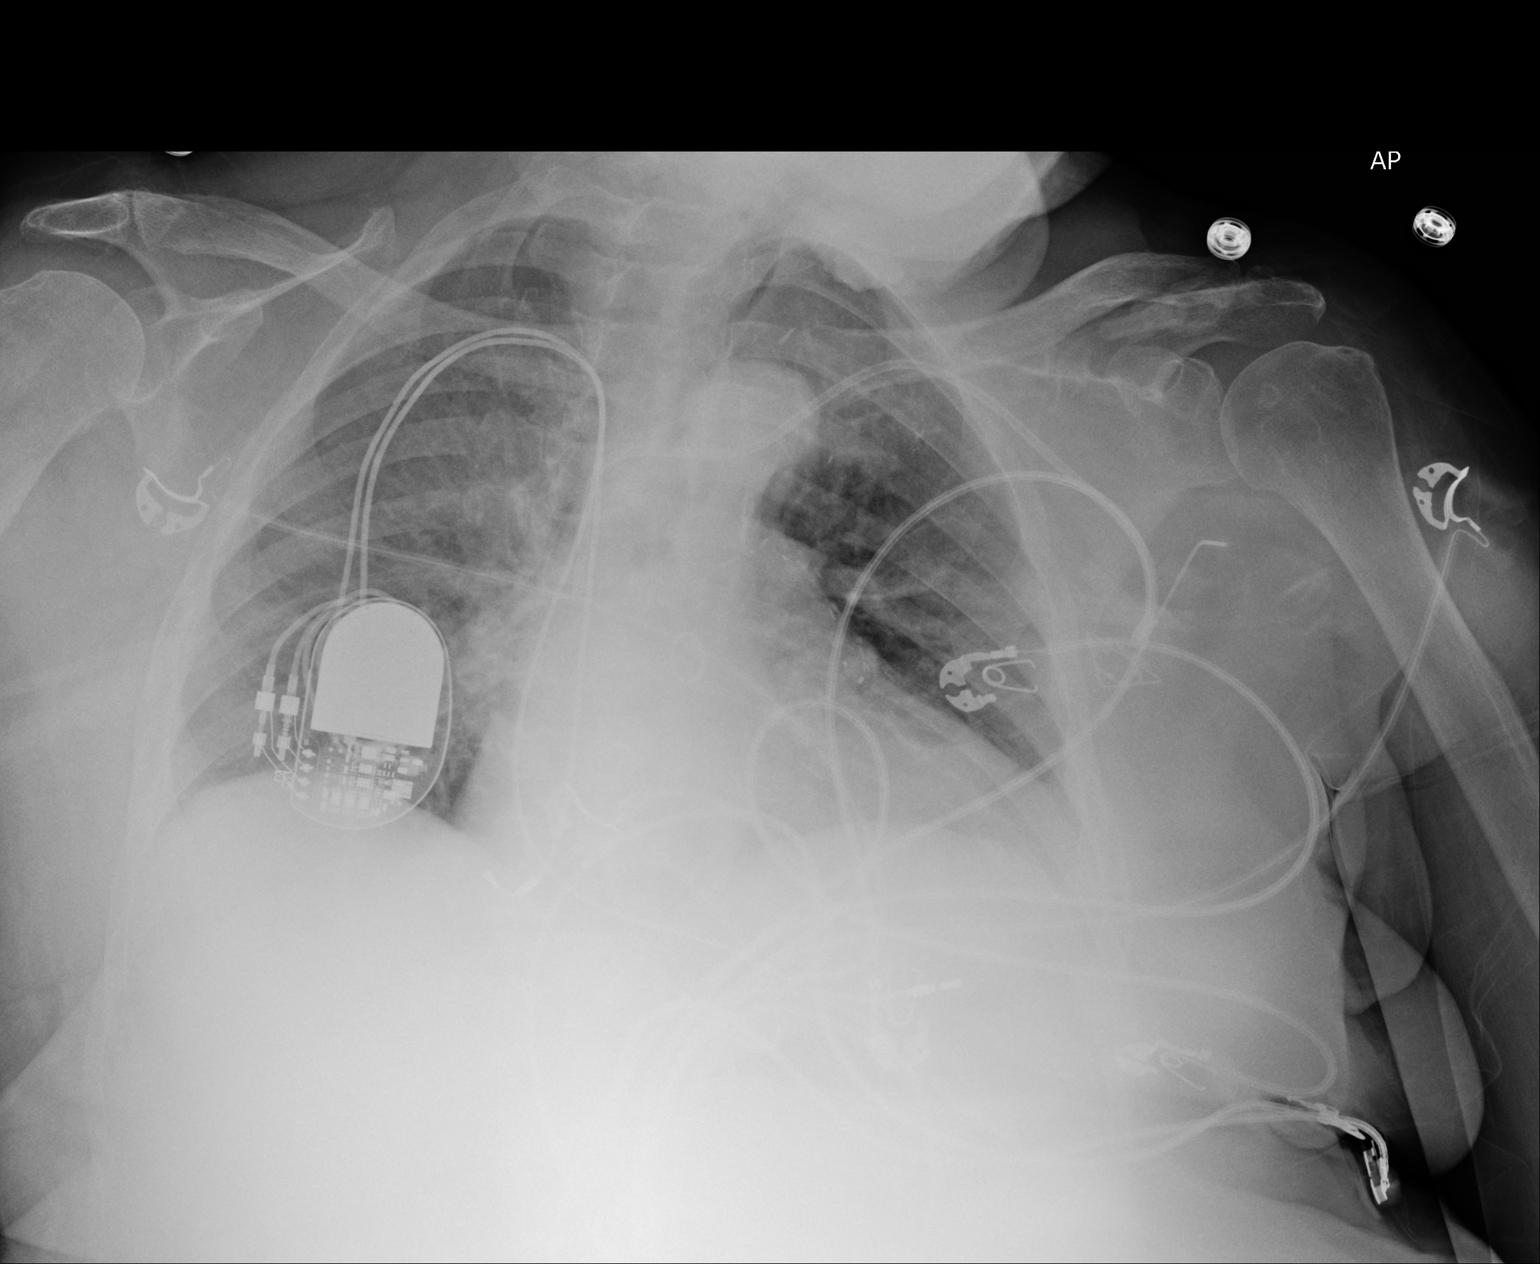

[1 of 1 positions shown; findings below may reference images not displayed]

FINDINGS: The patient is rotated on the exam. There is cardiomegaly without
edema. No pneumothorax or pleural effusion. Lungs clear. Pacing
device is in place. Aortic atherosclerosis is noted.
IMPRESSION: Cardiomegaly without acute disease.

## 2019-03-27 IMAGING — CR DG CHEST 1V PORT
1 series · 1 of 1 positions shown · non-contrast
Comparison: 09/26/2017

CLINICAL DATA: Respiratory difficulty

EXAM:
PORTABLE CHEST 1 VIEW

[portable]
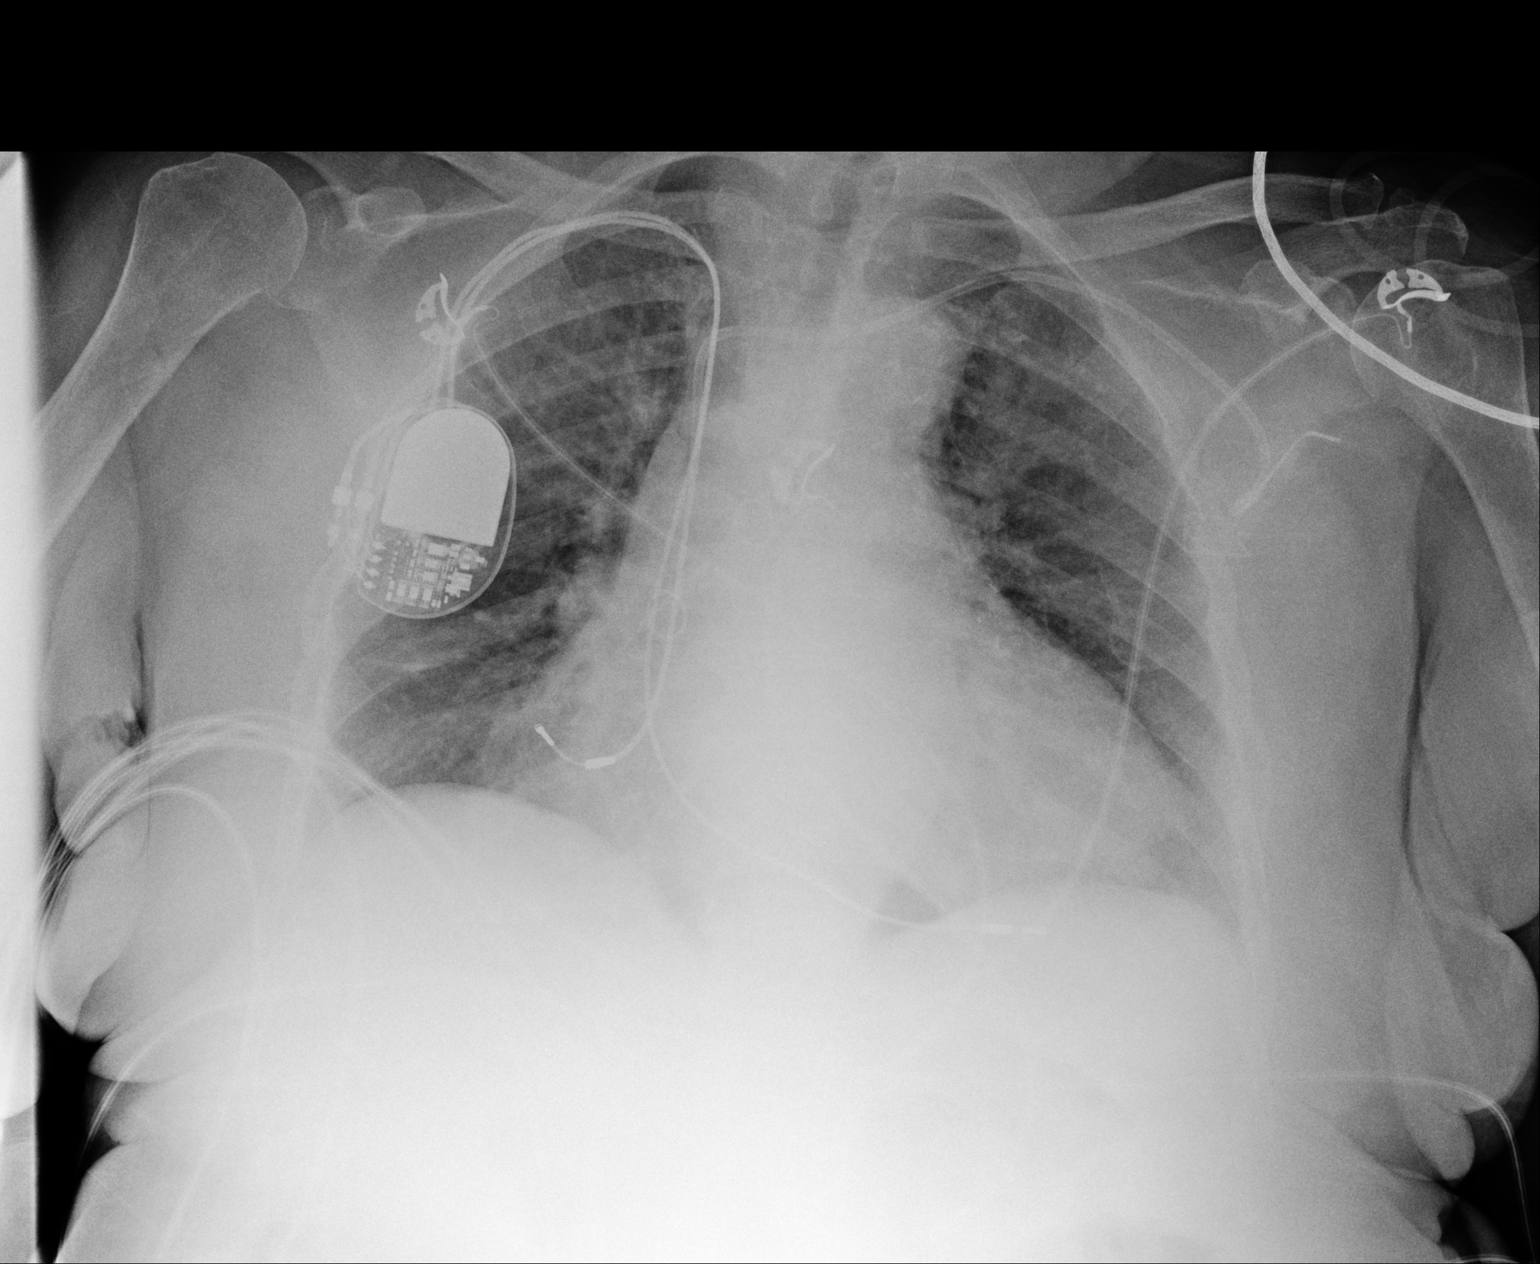

[1 of 1 positions shown; findings below may reference images not displayed]

FINDINGS: Cardiac shadow is enlarged. Postsurgical changes as well as a pacing
device are again seen and stable. Left-sided chest wall port is
again noted and stable. Lungs are well aerated bilaterally without
focal infiltrate or sizable effusion. No acute bony abnormality is
seen.
IMPRESSION: No active disease.

## 2019-04-16 IMAGING — DX DG CHEST 2V
2 series · 2 of 2 positions shown · non-contrast
Comparison: 09/28/2017.

CLINICAL DATA: Abdominal pain, nausea, vomiting, diarrhea and
generalized weakness.

EXAM:
CHEST  2 VIEW

[chest lat]
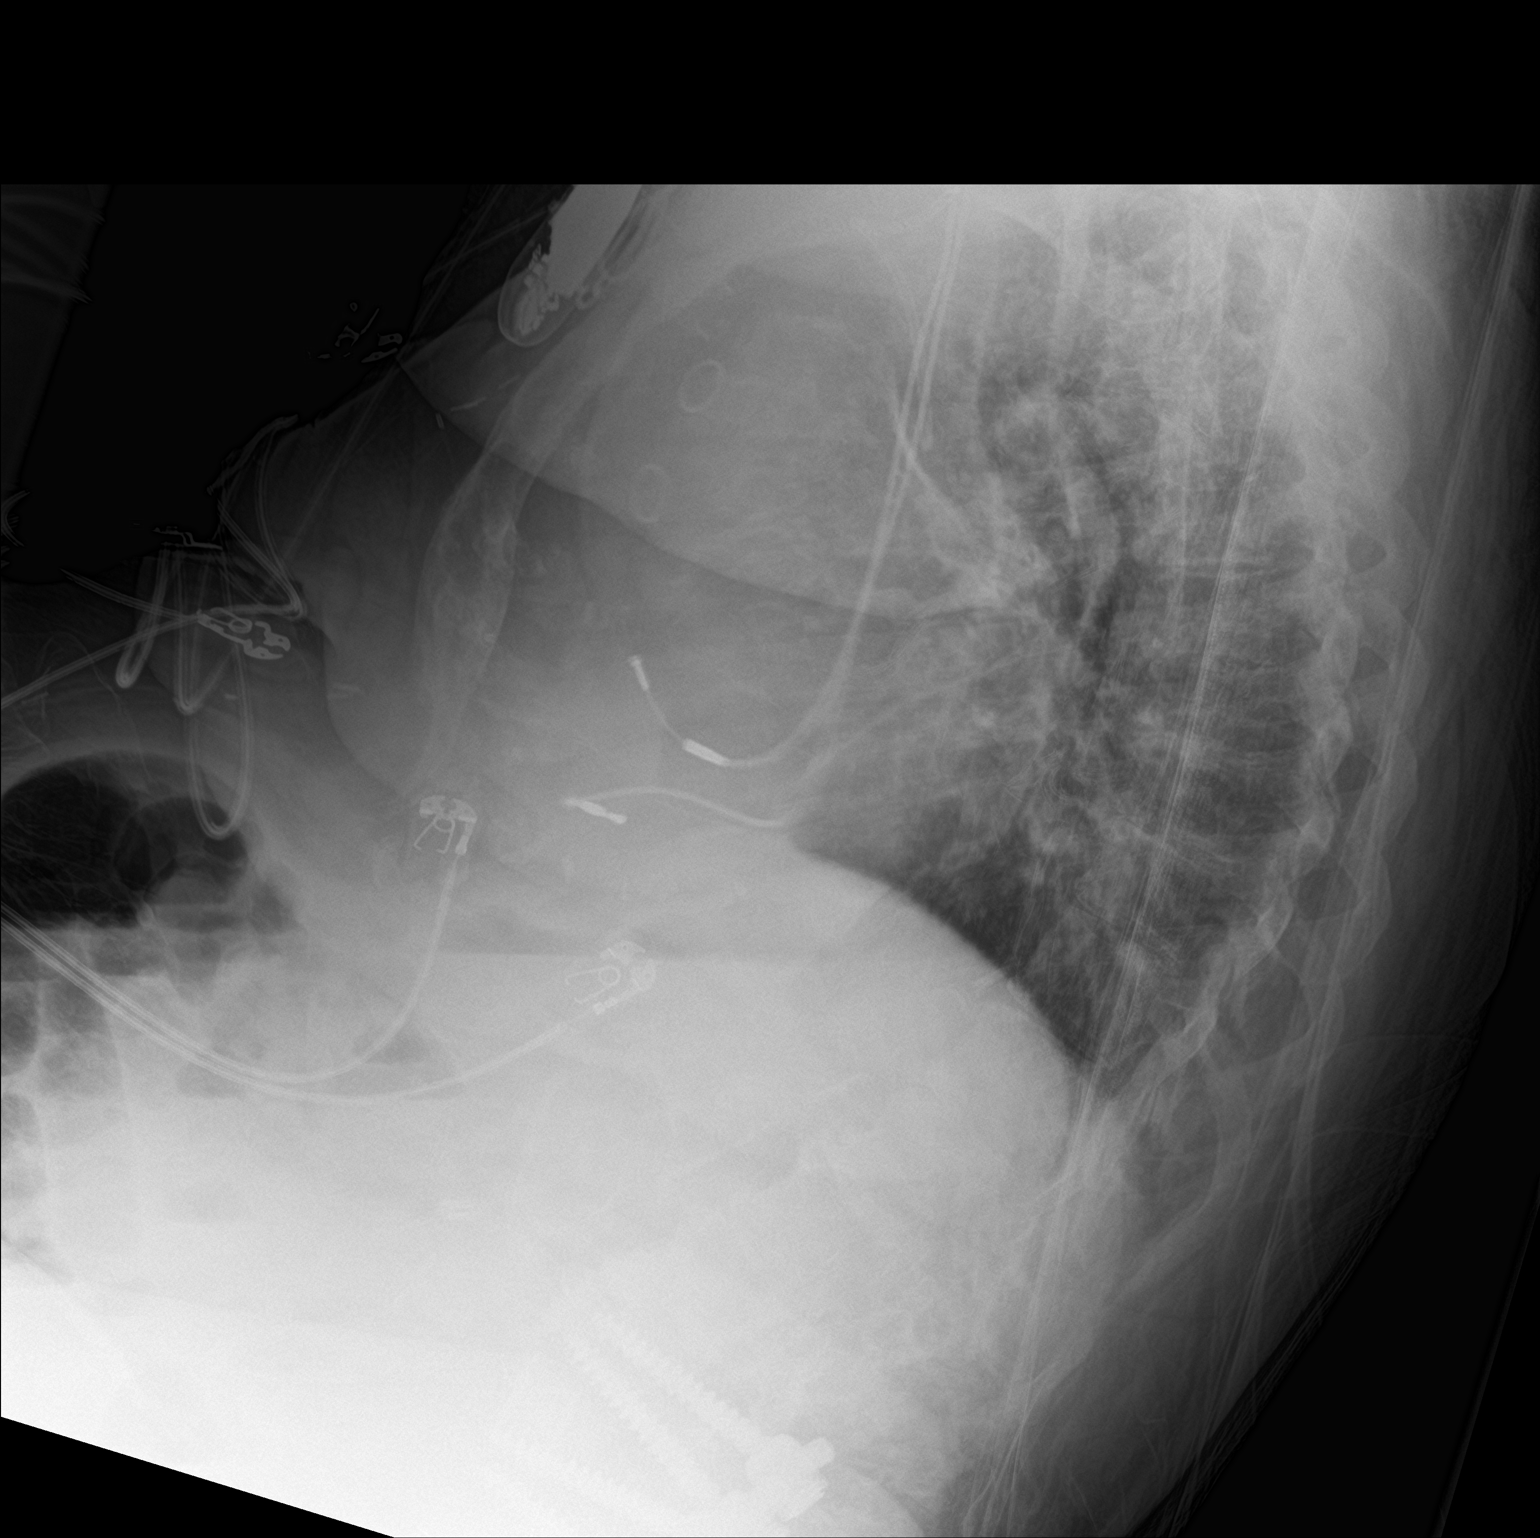

[chest ap]
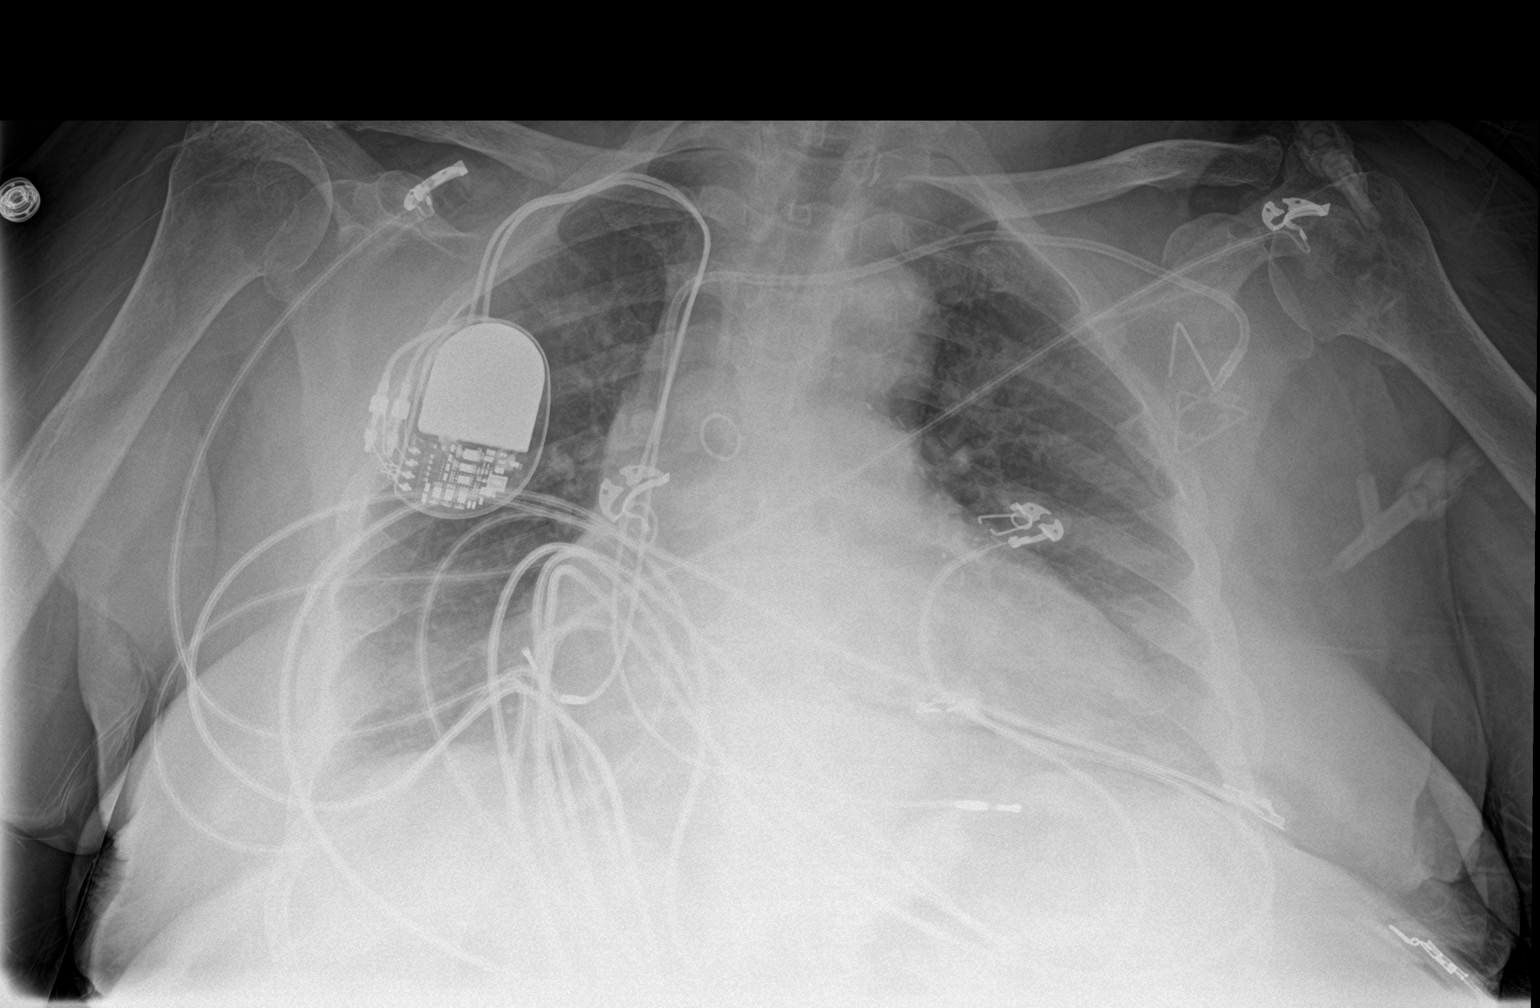

[2 of 2 positions shown; findings below may reference images not displayed]

FINDINGS: Trachea is midline. Heart is enlarged, stable. Thoracic aorta is
calcified. Pacemaker lead tips are in the right atrium and right
ventricle. Lungs are somewhat low in volume but are grossly clear.
No pleural fluid.
IMPRESSION: No acute findings.

## 2019-04-16 IMAGING — CT CT ABD-PELV W/O CM
2 of 4 series · 16 of 46 positions shown, 18 images · non-contrast
Comparison: 02/02/2015

CLINICAL DATA: Unspecified abdominal pain with diarrhea with nausea
and vomiting. Foul smelling urine

EXAM:
CT ABDOMEN AND PELVIS WITHOUT CONTRAST
TECHNIQUE: Multidetector CT imaging of the abdomen and pelvis was performed
following the standard protocol without IV contrast.

[Series 2: axial st · axial · 0.69mm/px · z∈[+878,+1248]mm · 13 of 82 slices shown, 15 images]
[im 4/82  soft-tissue]
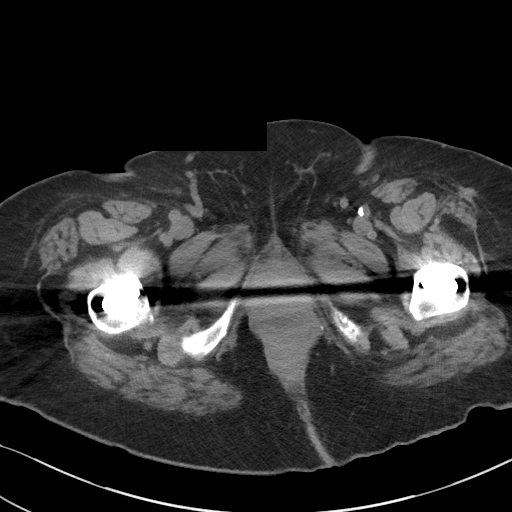
[im 4/82  bone]
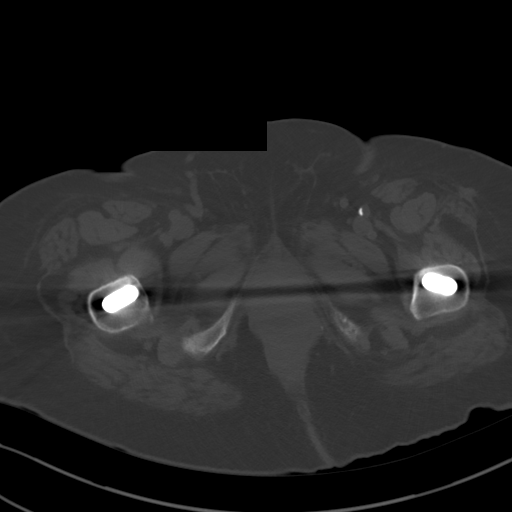
[im 11/82  soft-tissue]
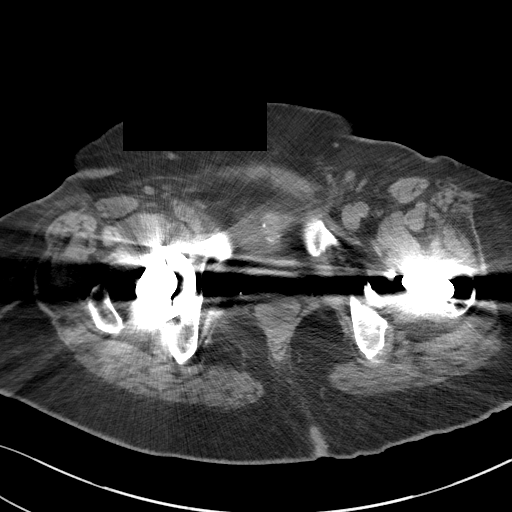
[im 17/82  soft-tissue]
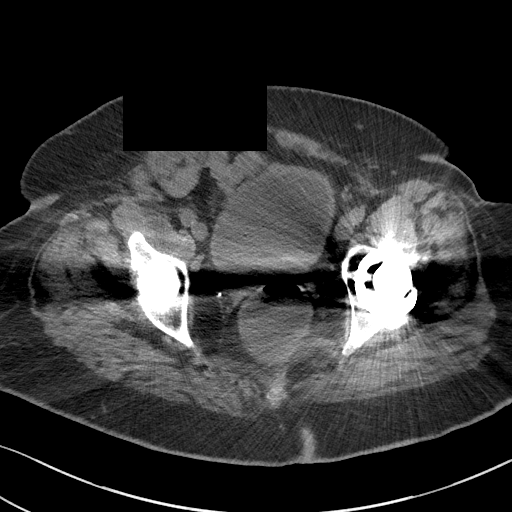
[im 24/82  soft-tissue]
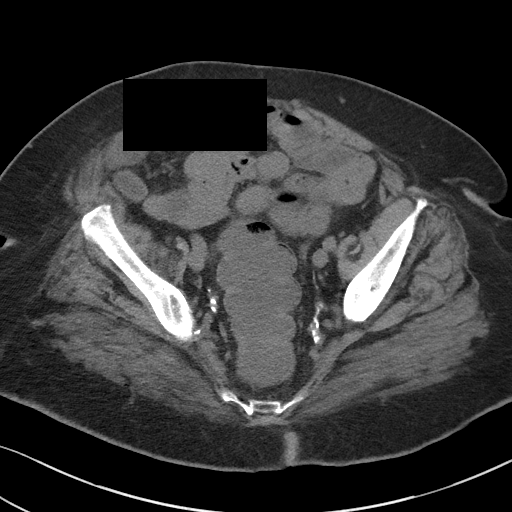
[im 28/82  soft-tissue]
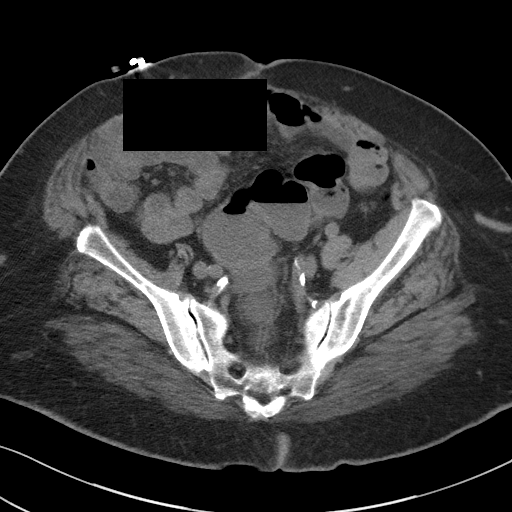
[im 34/82  soft-tissue]
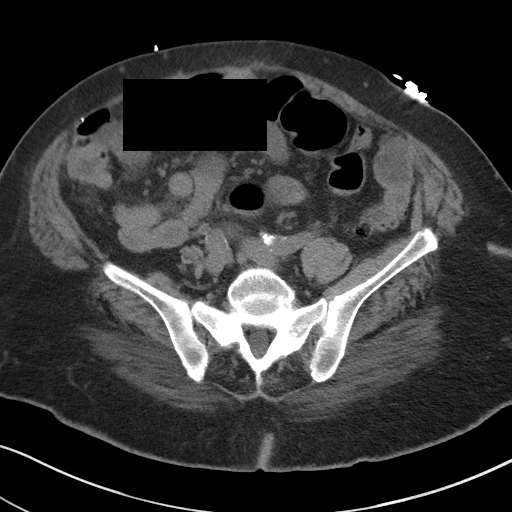
[im 41/82  soft-tissue]
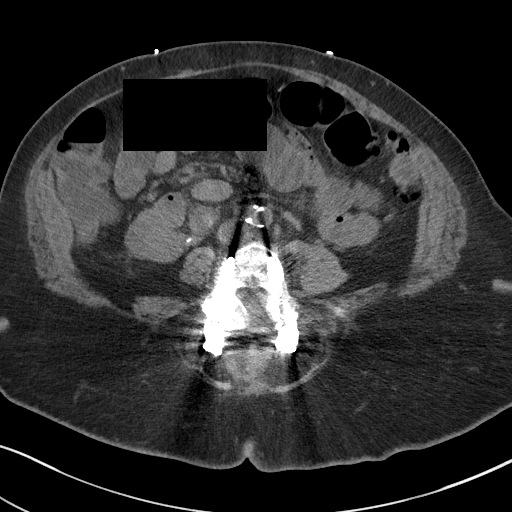
[im 48/82  soft-tissue]
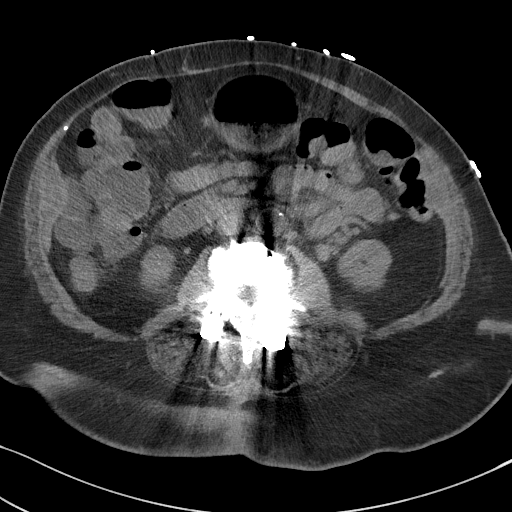
[im 55/82  soft-tissue]
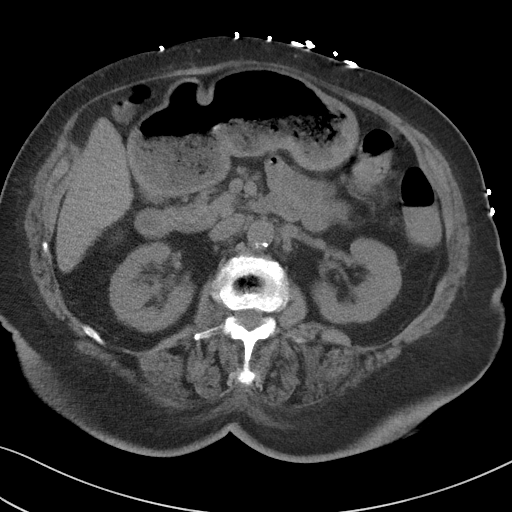
[im 55/82  bone]
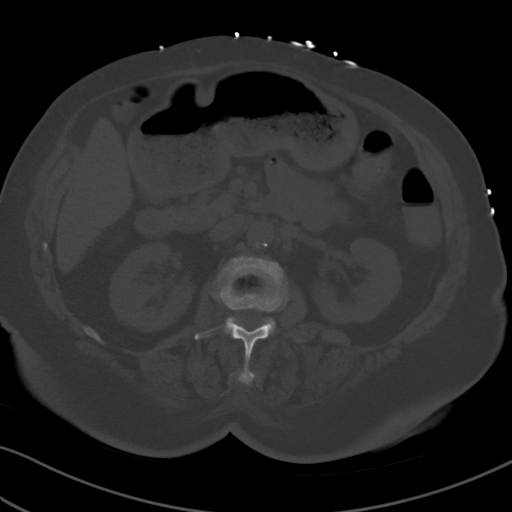
[im 58/82  soft-tissue]
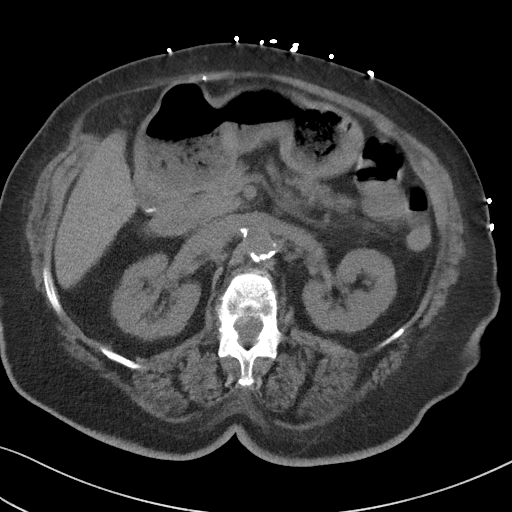
[im 65/82  soft-tissue]
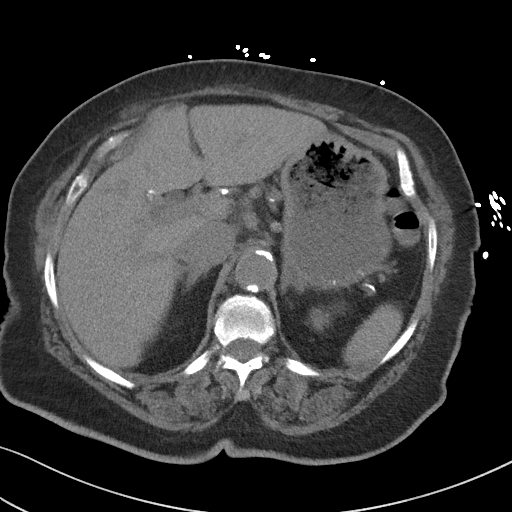
[im 71/82  soft-tissue]
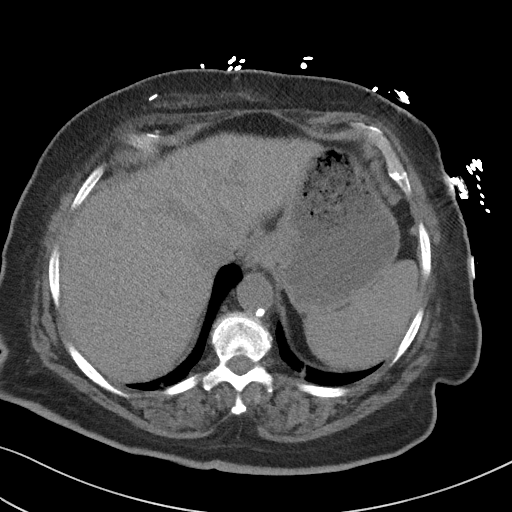
[im 78/82  soft-tissue]
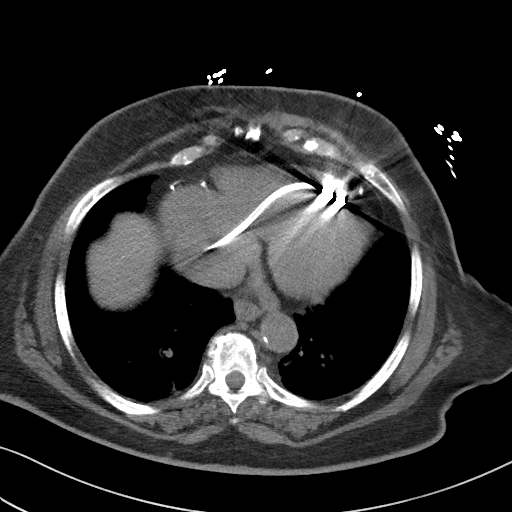

[Series 5: coronal st · coronal · 0.80mm/px · 3 of 100 slices shown]
[im 34/100  soft-tissue]
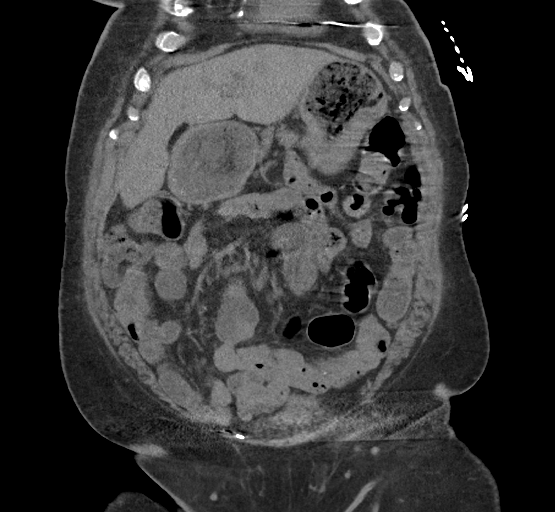
[im 45/100  soft-tissue]
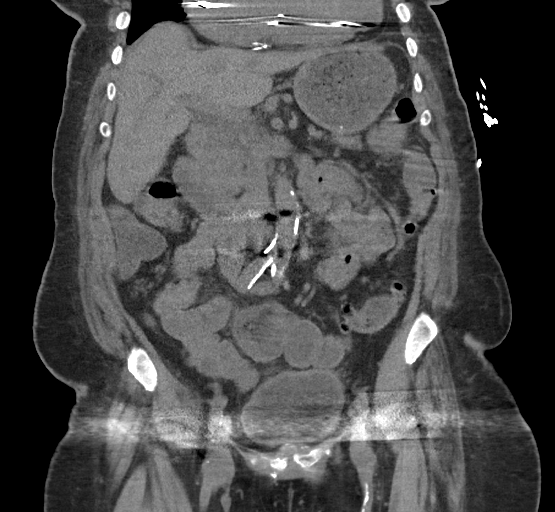
[im 56/100  soft-tissue]
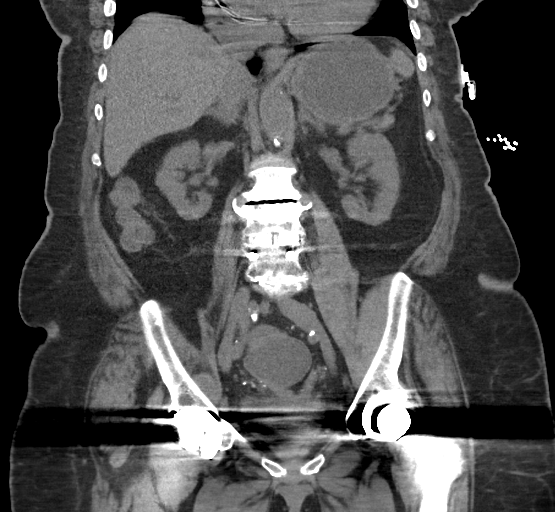

[16 of 46 positions shown; findings below may reference images not displayed]

FINDINGS: Lower chest: Cardiomegaly and biventricular pacer. Coronary
atherosclerotic calcification. Mild scarring or atelectasis in the
lower lobes.

Hepatobiliary: No focal liver abnormality.Cholecystectomy with
chronic common bile duct dilatation, presumably reservoir effect
given stability. Common bile duct measures up to 1 cm at the
midportion. Closely neighboring calcification of the pancreatic head
is atherosclerotic.

Pancreas: Unremarkable.

Spleen: Unremarkable.

Adrenals/Urinary Tract: Negative adrenals. No hydronephrosis or
stone. Calcifications along the upper left ureter have the
appearance of phleboliths. Negative bladder where not obscured by
streak artifact from hip prosthesis.

Stomach/Bowel: Fluid levels in the distal colon correlating with
history of diarrhea. Distal colonic diverticulosis. No evidence of
inflammatory bowel wall thickening. No bowel obstruction. The
stomach is moderately distended. No indication of chronic vomiting.

Vascular/Lymphatic: Diffuse atherosclerotic calcification of the
aorta and its branches. No acute vascular finding. Negative for mass
or adenopathy.

Reproductive:Hysterectomy.  Possible oophorectomies.

Other: Small fatty umbilical hernia that was also seen in 0254.
Probable right inguinal hernia repair.

Musculoskeletal: Bilateral hip replacement. There is a chronic
cystic density mass along the lower right iliacus measuring up to
3.5 cm. L3-4 and L4-5 discectomy and solid arthrodesis. Advanced
super adjacent lumbar disc degeneration. Osteopenia.
IMPRESSION: 1. Colonic fluid levels correlating with history of diarrhea. No
bowel wall thickening or obstruction.
2.  Aortic Atherosclerosis ([id number]).  Coronary atherosclerosis.
3. Cystic density mass at the lower right iliacus consistent with
chronic bursitis. Bilateral hip replacement.
4. Chronic fatty umbilical hernia.

## 2019-04-17 ENCOUNTER — Telehealth: Payer: Self-pay | Admitting: Cardiology

## 2019-04-17 NOTE — Telephone Encounter (Signed)
Pt daughter called back and stated that pt is living at the [facility] in [place].   [facility]. No answer and unable to leave a message.

## 2019-04-17 NOTE — Telephone Encounter (Signed)
LMOVM for pt to return call 

## 2020-04-29 IMAGING — CR DG CHEST 1V
1 series · 1 of 1 positions shown · non-contrast
Comparison: 10/18/2017

CLINICAL DATA: Altered mental status

EXAM:
CHEST  1 VIEW

[ap]
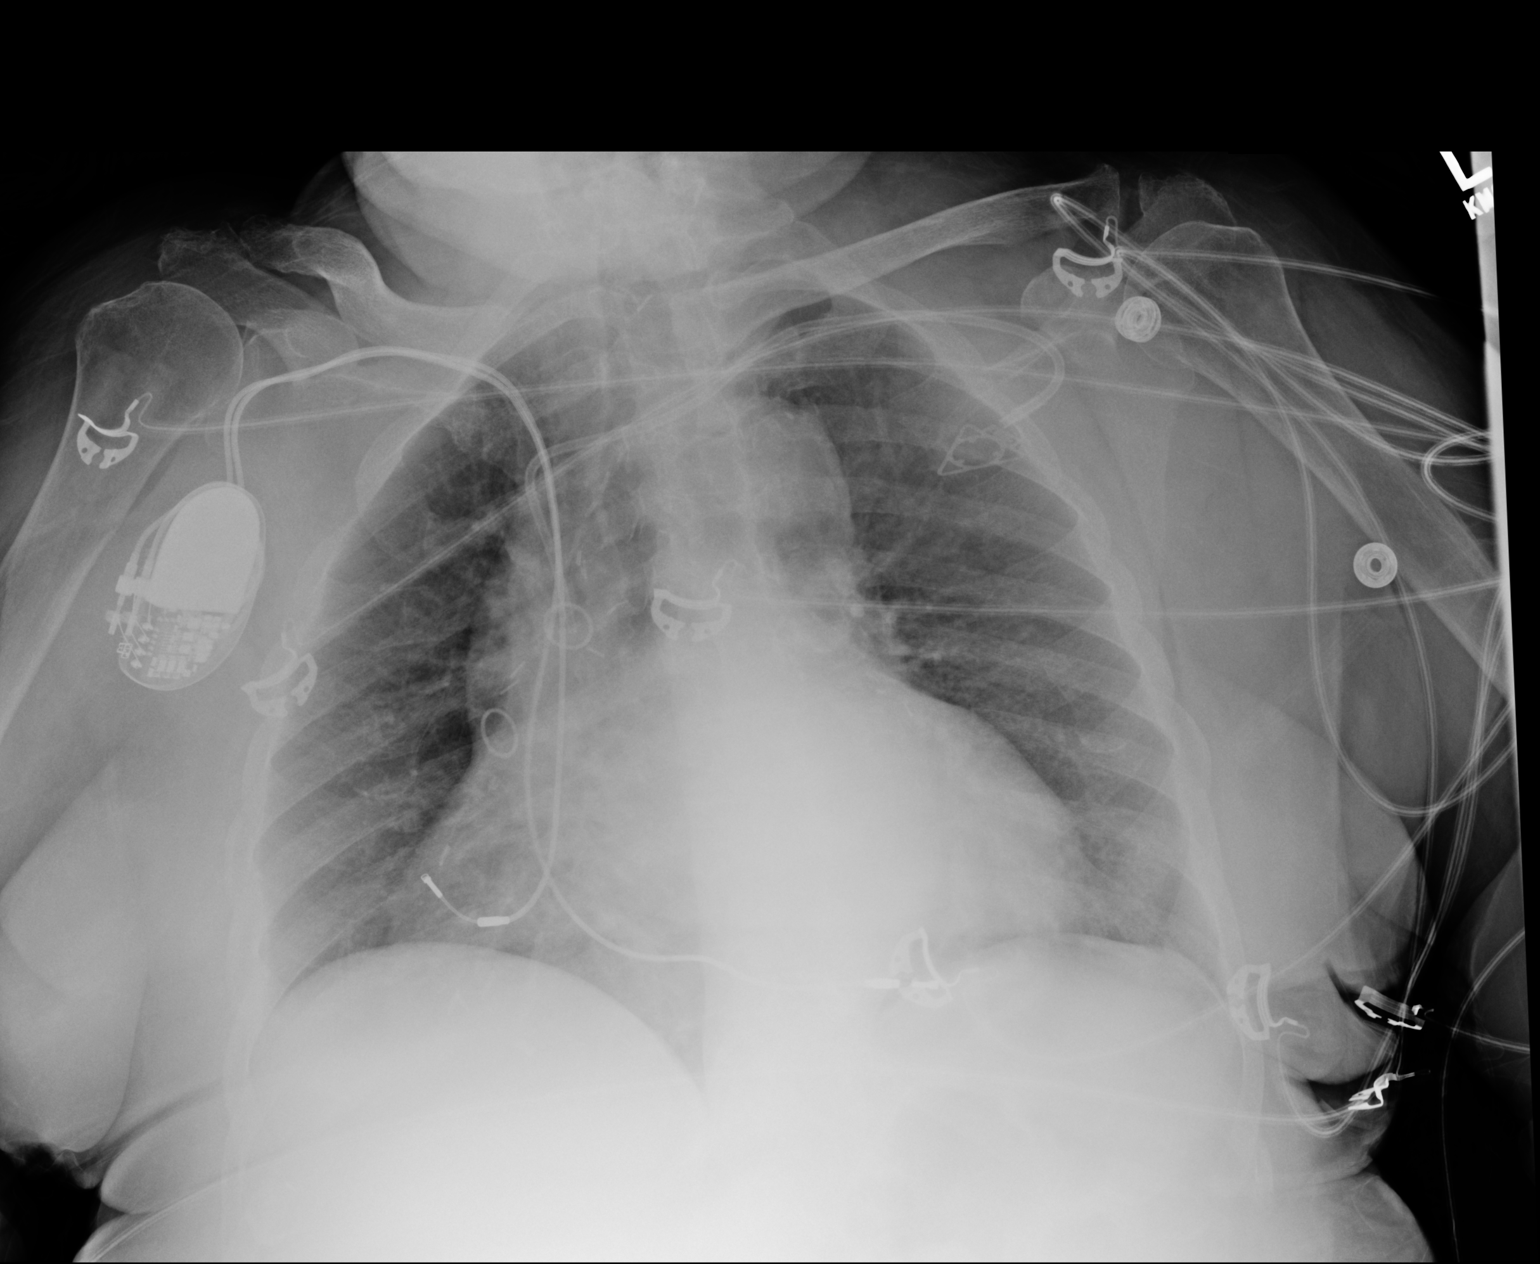

[1 of 1 positions shown; findings below may reference images not displayed]

FINDINGS: Left-sided central venous port tip over the SVC. Right-sided pacing
device as before. Cardiomegaly without acute opacity or pleural
effusion. No pneumothorax.
IMPRESSION: No active disease.  Cardiomegaly

## 2020-04-29 IMAGING — CT CT HEAD W/O CM
3 series · 15 of 46 positions shown, 18 images · non-contrast
Comparison: Prior CT from 09/26/2017.

CLINICAL DATA: Initial evaluation for acute lethargy, confusion,
decreased level of consciousness.

EXAM:
CT HEAD WITHOUT CONTRAST
TECHNIQUE: Contiguous axial images were obtained from the base of the skull
through the vertex without intravenous contrast.

[Series 2: head wo · axial · 0.44mm/px · z∈[+1320,+1440]mm · 9 of 29 slices shown, 12 images]
[im 3/29  brain]
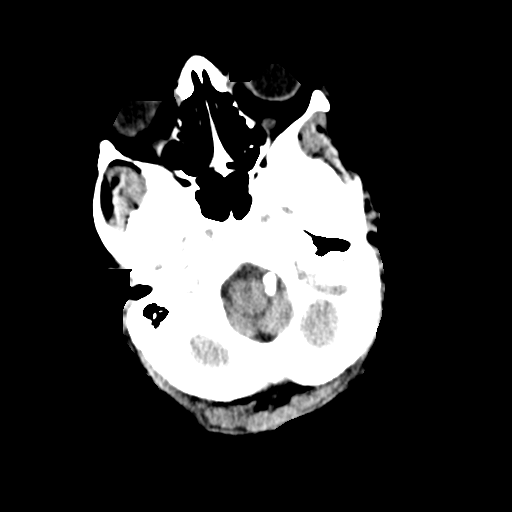
[im 3/29  bone]
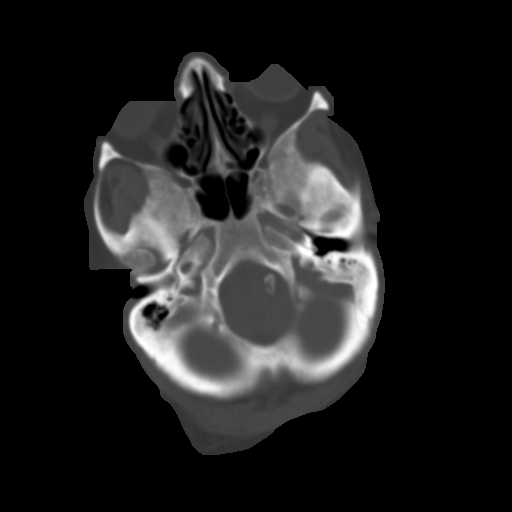
[im 6/29  brain]
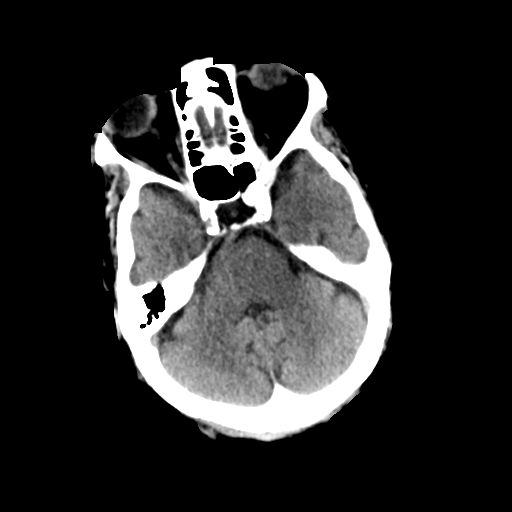
[im 9/29  brain]
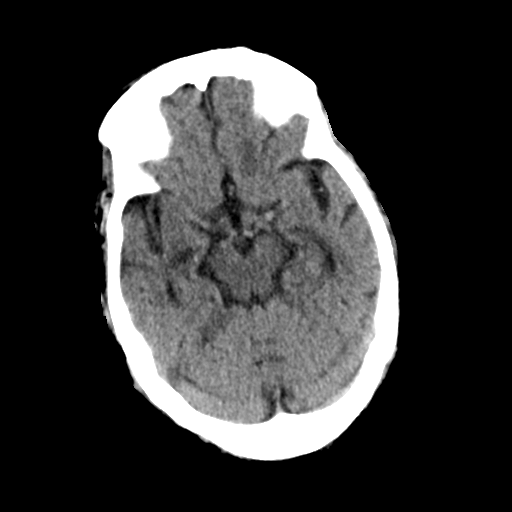
[im 12/29  brain]
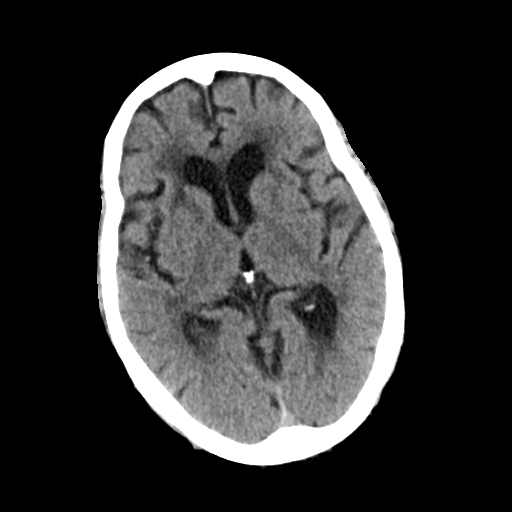
[im 15/29  brain]
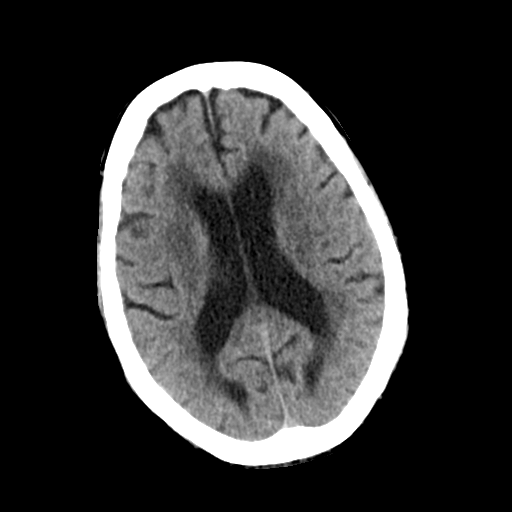
[im 15/29  bone]
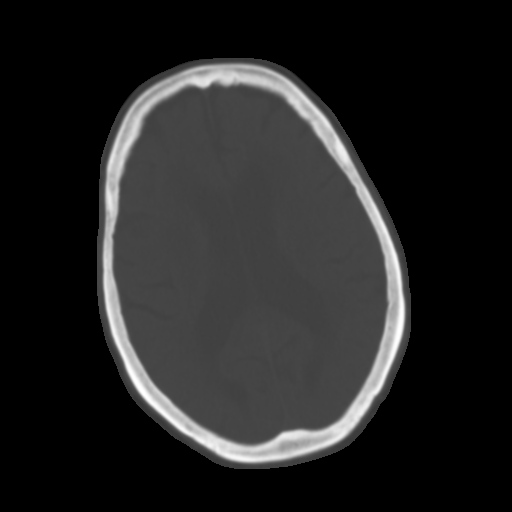
[im 18/29  brain]
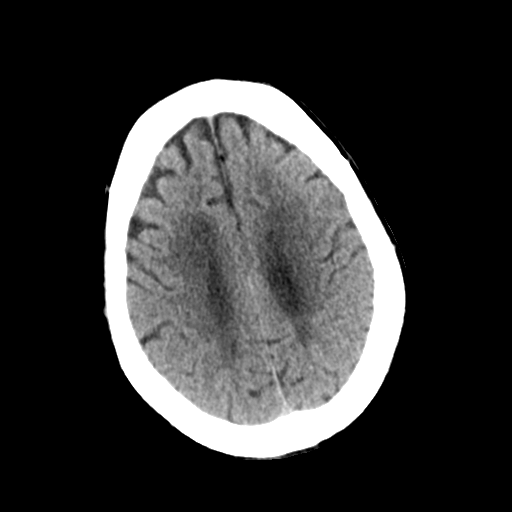
[im 21/29  brain]
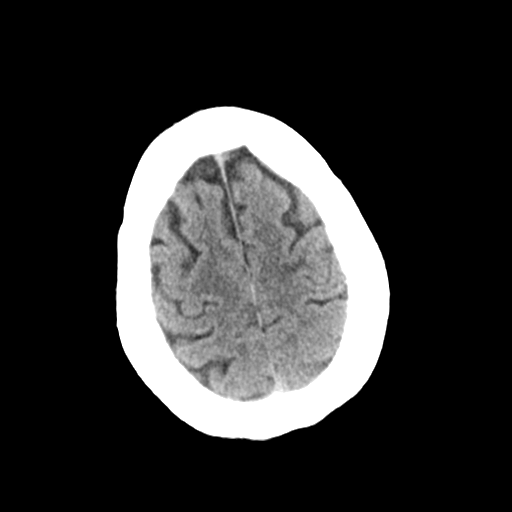
[im 24/29  brain]
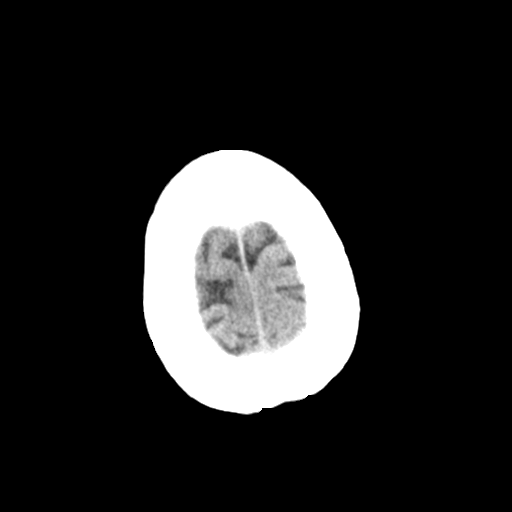
[im 27/29  brain]
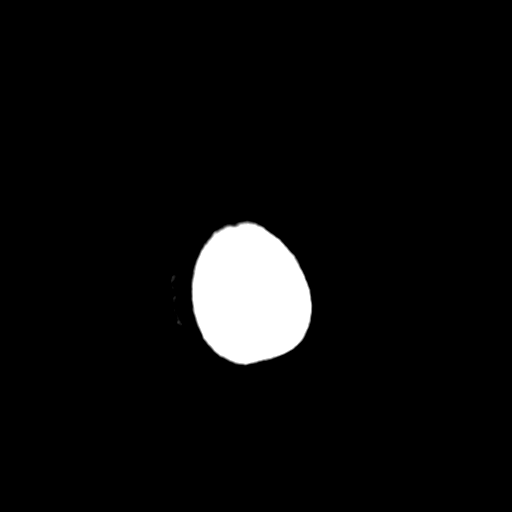
[im 27/29  bone]
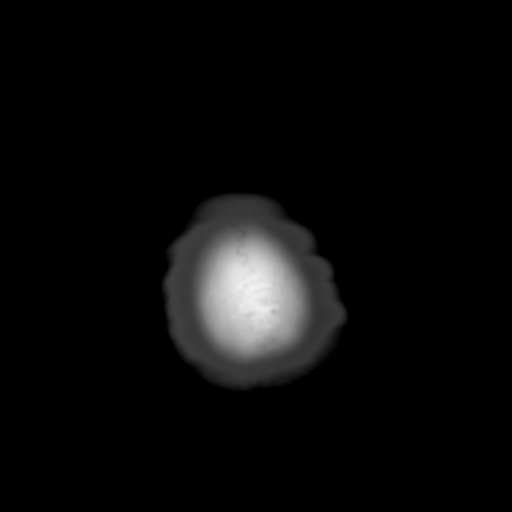

[Series 4: coronal soft tissue · coronal · 0.35mm/px · 3 of 68 slices shown]
[im 23/68  brain]
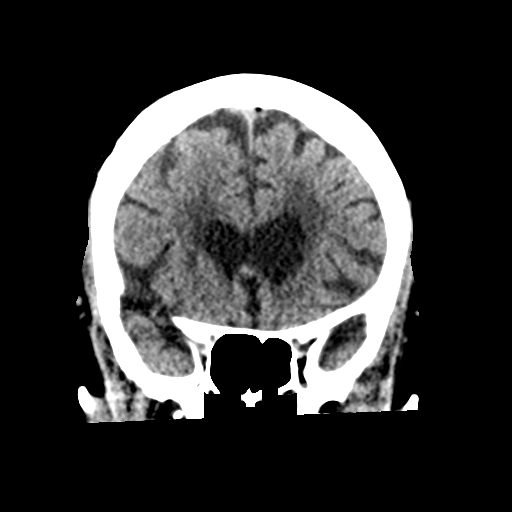
[im 30/68  brain]
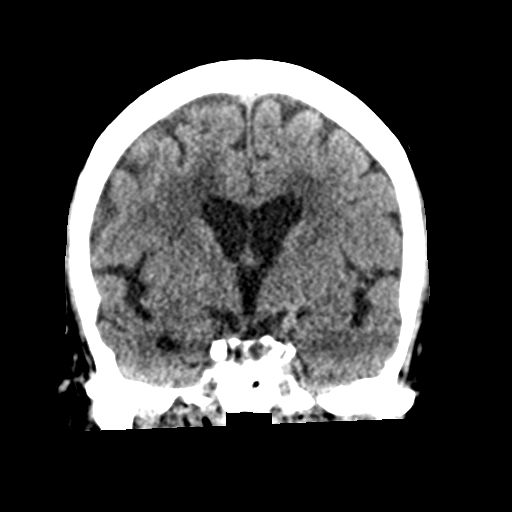
[im 38/68  brain]
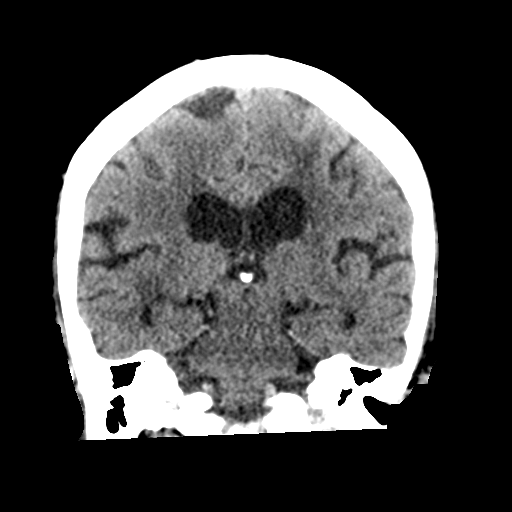

[Series 5: sagittal soft tissue · sagittal · 0.33mm/px · 3 of 66 slices shown]
[im 22/66  brain]
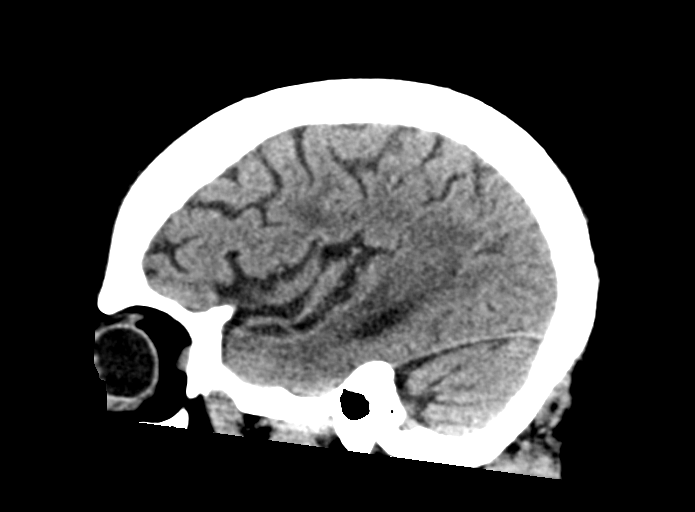
[im 33/66  brain]
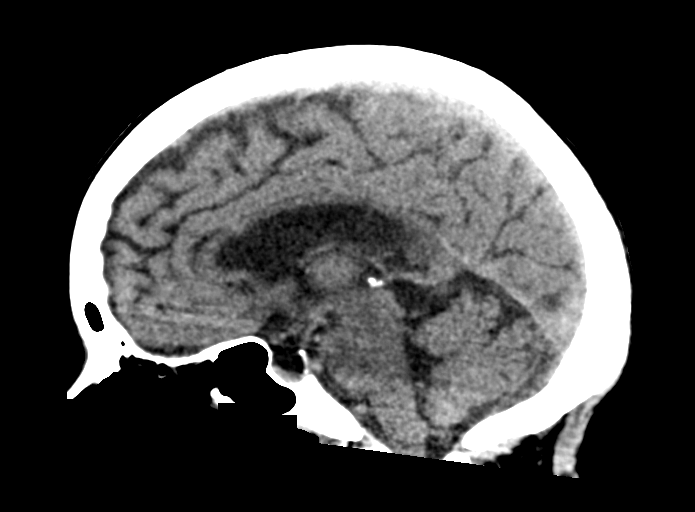
[im 44/66  brain]
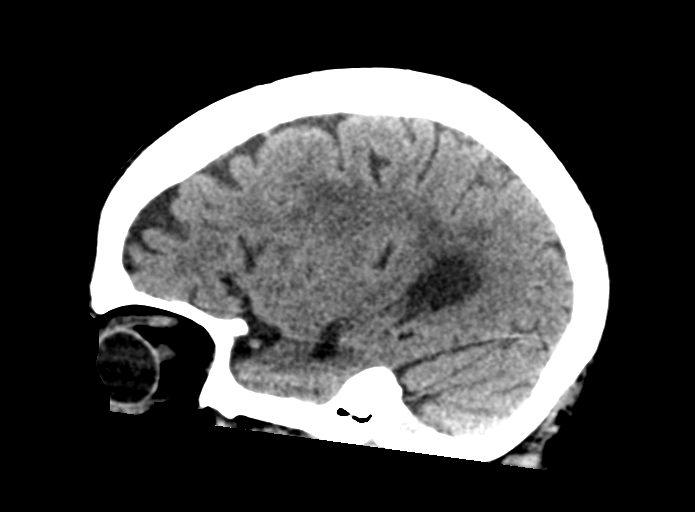

[15 of 46 positions shown; findings below may reference images not displayed]

FINDINGS: Brain: Generalized age-related cerebral atrophy with moderate to
advanced chronic microvascular ischemic disease, mildly progressed
relative to 6953. No acute intracranial hemorrhage. No acute large
vessel territory infarct. No mass lesion, midline shift or mass
effect. No hydrocephalus. No extra-axial fluid collection.

Vascular: No hyperdense vessel. Calcified atherosclerosis at the
skull base.

Skull: Scalp soft tissues and calvarium demonstrate no acute
abnormality.

Sinuses/Orbits: Globes and orbital soft tissues within normal
limits. Paranasal sinuses and mastoid air cells are clear.

Other: None.
IMPRESSION: 1. No acute intracranial abnormality.
2. Generalized age-related cerebral atrophy with moderate to
advanced chronic microvascular ischemic changes, mildly progressed
relative to 6953.

## 2020-05-11 ENCOUNTER — Ambulatory Visit (INDEPENDENT_AMBULATORY_CARE_PROVIDER_SITE_OTHER): Payer: Medicare Other | Admitting: Internal Medicine

## 2020-05-11 ENCOUNTER — Encounter: Payer: Self-pay | Admitting: Internal Medicine

## 2020-05-11 VITALS — BP 138/88 | HR 63 | Ht 66.0 in

## 2020-05-11 DIAGNOSIS — I4819 Other persistent atrial fibrillation: Secondary | ICD-10-CM

## 2020-05-11 DIAGNOSIS — I4430 Unspecified atrioventricular block: Secondary | ICD-10-CM

## 2020-05-11 LAB — CUP PACEART INCLINIC DEVICE CHECK
Battery Impedance: 1602 Ohm
Battery Remaining Longevity: 41 mo
Battery Voltage: 2.76 V
Brady Statistic RV Percent Paced: 37 %
Date Time Interrogation Session: 20210607121205
Implantable Lead Implant Date: 19930812
Implantable Lead Implant Date: 19930812
Implantable Lead Location: 753859
Implantable Lead Location: 753860
Implantable Lead Model: 501154
Implantable Pulse Generator Implant Date: 20120510
Lead Channel Impedance Value: 578 Ohm
Lead Channel Impedance Value: 599 Ohm
Lead Channel Pacing Threshold Amplitude: 1.25 V
Lead Channel Pacing Threshold Pulse Width: 0.4 ms
Lead Channel Setting Pacing Amplitude: 2.5 V
Lead Channel Setting Pacing Pulse Width: 0.4 ms
Lead Channel Setting Sensing Sensitivity: 5.6 mV

## 2020-05-11 NOTE — Progress Notes (Signed)
HPI [name] returns today after a long absence from our EP clinic. She is a pleasant 79 yo woman who resides in a SNF with perm atrial fib, sinus node dysfunction, s/p PPM insertion, HTN and insomnia. Her biggest complaint is difficulty sleeping. She denies chest pain or sob.  Allergies  Allergen Reactions   Codeine Other (See Comments)    Reaction:  Headaches    Food Nausea And Vomiting and Other (See Comments)    Pt is allergic to leafy raw vegetables and seeds.     Levaquin [Levofloxacin] Other (See Comments)    Confusion, altered mental status, inability to walk   Oxycodone Hcl Other (See Comments)    Reaction:  Headaches    Peanut-Containing Drug Products Nausea And Vomiting   Reglan [Metoclopramide] Other (See Comments)    Reaction:  Confusion      Current Outpatient Medications  Medication Sig Dispense Refill   acetaminophen (TYLENOL) 325 MG tablet Take 650 mg by mouth every 4 (four) hours as needed for mild pain, fever or headache.     apixaban (ELIQUIS) 5 MG TABS tablet Take 5 mg by mouth 2 (two) times daily.      atorvastatin (LIPITOR) 10 MG tablet Take 10 mg by mouth at bedtime.      famotidine (PEPCID) 20 MG tablet Take 20 mg by mouth daily.     ferrous sulfate 325 (65 FE) MG tablet Take 325 mg by mouth daily with breakfast.     gabapentin (NEURONTIN) 100 MG capsule Take 200 mg by mouth at bedtime.     levETIRAcetam (KEPPRA) 500 MG tablet Take 500 mg by mouth 2 (two) times daily.     linaclotide (LINZESS) 145 MCG CAPS capsule Take 145 mcg by mouth daily before breakfast.     Melatonin 5 MG TABS Take 5 mg by mouth at bedtime as needed (for sleep).      metoprolol tartrate (LOPRESSOR) 25 MG tablet Take 12.5 mg by mouth 2 (two) times daily.      nitroGLYCERIN (NITRODUR - DOSED IN MG/24 HR) 0.4 mg/hr patch Place 0.4 mg onto the skin as needed. Give 1 tablet sublingually every 5 minutes as needed fo chest pain     potassium chloride  (K-DUR,KLOR-CON) 10 MEQ tablet Take 10 mEq by mouth daily.     senna-docusate (SENEXON-S) 8.6-50 MG tablet Take 1 tablet by mouth 2 (two) times daily.     sertraline (ZOLOFT) 50 MG tablet Take 50 mg by mouth daily.     traMADol (ULTRAM) 50 MG tablet Take 50 mg by mouth every 4 (four) hours as needed for moderate pain. Give 1 tablet by mouth every 4 hours as needed for pain     traZODone (DESYREL) 50 MG tablet Take 50 mg by mouth at bedtime.     Vitamin D, Ergocalciferol, (DRISDOL) 1.25 MG (50000 UT) CAPS capsule Take 50,000 Units by mouth every 7 (seven) days.     No current facility-administered medications for this visit.     Past Medical History:  Diagnosis Date   Anemia    Anginal pain ([place])    "all the time, but not heart related"   Arthritis    "legs and back" (09/30/2013)   Asthma    years ago   Atrial fibrillation ([place])    AV block 1993   s/p dual-chamber PPM, 1993, Medtronic Kappa; generator change-2003    Blurred vision, bilateral    sometimes double vision   CAD (  coronary artery disease)    multivessel, CABG 6/10.  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  Myoview (11/20/11) showed no ischemia & EF 62%   Chronic lower back pain    Confusion    COPD (chronic obstructive pulmonary disease) (HCC)    severe lung disease by PFTs 6/12   DDD (degenerative disc disease)    Deep vein thrombosis, upper right extremity (HCC)    post-PPM   Dehiscence of closure of sternum or sternotomy 07/2009   sternal infection; required flap closure   Dementia (HCC)    Diabetes mellitus, type II (HCC)    Gastroesophageal reflux disease    not much   H/O hiatal hernia    History of kidney stones    Hyperlipidemia    Hypertension    Migraines    "years ago" (09/30/2013)   Obesity    Orthopnea    sometimes   Pacemaker    PUD (peptic ulcer disease)    Seizures (HCC)    "long time ago; don't remember what they were related to" (09/30/2013)none recent   Sleep apnea    denies  CPAP use on 09/30/2013   Stroke (HCC)    years ago, "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   Past Surgical History:  Procedure Laterality Date   ABDOMINAL HYSTERECTOMY     partial   APPENDECTOMY     BACK SURGERY     x3 total   CESAREAN SECTION  ~1980   CHOLECYSTECTOMY     COLONOSCOPY  04/27/2005   hemorrhoids, diverticula   COLONOSCOPY WITH ESOPHAGOGASTRODUODENOSCOPY (EGD) N/A 02/20/2013   RMR: pancolonic diverticulosis and internal hemorrhoids. EGD with small hiatal hernia, healed gastric ulcer   CORONARY ARTERY BYPASS GRAFT  05/1999   - LIMA to LAD, SVG to OM, SVG to PDA   ESOPHAGOGASTRODUODENOSCOPY     with [facility] dilation,  biopsy, and disruption Schatzki's ring   ESOPHAGOGASTRODUODENOSCOPY  09/10/2012   RMR: Noncritical Schatzki's ring. Diffuse gastric erosions and an  area of partially healing ulceration-status post biopsy/ Small hiatal hernia. Bx reactive gastropathy.   INSERT / REPLACE / REMOVE PACEMAKER  1993   JOINT REPLACEMENT     LUMBAR DISC SURGERY     PACEMAKER PLACEMENT  1993   Status post DDD pacemaker implantation in 1993, with Medtronic Kappa generator change in 2003 for  second-degree AV block, most recent gen change by JA 04/14/11   PORTACATH PLACEMENT Left 07/17/2013   Procedure: INSERTION PORT-A-CATH-left subclavian;  Surgeon: Fabio Bering, MD;  Location: [facility];  Service: General;  Laterality: Left;   POSTERIOR LUMBAR FUSION     RECONSTRUCTIVE REPAIR STERNAL     for infection s/p CABG 8/10   REPLACEMENT TOTAL KNEE Bilateral    THYROIDECTOMY     TOTAL HIP ARTHROPLASTY Right    TOTAL HIP ARTHROPLASTY Left 12/03/2013   Procedure: LEFT TOTAL HIP ARTHROPLASTY ANTERIOR APPROACH;  Surgeon: Shelda Pal, MD;  Location: [facility];  Service: Orthopedics;  Laterality: Left;   TUBAL LIGATION     WRIST SURGERY Left    "tumor taken off" (09/30/2013)      Family History  Problem Relation Age of Onset   Heart disease Mother        MI   Colon cancer Father        age 27   Diabetes Other    Coronary artery disease Other  Female <55   Cancer Other      Social History   Socioeconomic History   Marital status: Widowed    Spouse name: Not on file   Number of children: 5   Years of education: Not on file   Highest education level: Not on file  Occupational History    Employer: RETIRED  Tobacco Use   Smoking status: Never Smoker   Smokeless tobacco: Former Neurosurgeon    Types: Snuff   Tobacco comment: 09/30/2013 "stopped snuff a long time ago"  Substance and Sexual Activity   Alcohol use: No   Drug use: No   Sexual activity: Never    Birth control/protection: Post-menopausal, Surgical  Other Topics Concern   Not on file  Social History Narrative   Lives at home with daughter, son-in-law   Caffeine use-  none   Social Determinants of Health   Financial Resource Strain:    Difficulty of Paying Living Expenses:   Food Insecurity:    Worried About Programme researcher, broadcasting/film/video in the Last Year:    Barista in the Last Year:   Transportation Needs:    Freight forwarder (Medical):    Lack of Transportation (Non-Medical):   Physical Activity:    Days of Exercise per Week:    Minutes of Exercise per Session:   Stress:    Feeling of Stress :   Social Connections:    Frequency of Communication with Friends and Family:    Frequency of Social Gatherings with Friends and Family:    Attends Religious Services:    Active Member of Clubs or Organizations:    Attends Engineer, structural:    Marital Status:   Intimate Partner Violence:    Fear of Current or Ex-Partner:    Emotionally Abused:    Physically Abused:    Sexually Abused:      BP 138/88    Pulse 63    Ht 5\' 6"  (1.676 m)    SpO2 100%    BMI 28.82 kg/m   Physical Exam:  Well appearing NAD HEENT:  Unremarkable Neck:  No JVD, no thyromegally Lymphatics:  No adenopathy Back:  No CVA tenderness Lungs:  Clear with no wheezes HEART:  Regular rate rhythm, no murmurs, no rubs, no clicks Abd:  soft, positive bowel sounds, no organomegally, no rebound, no guarding Ext:  2 plus pulses, no edema, no cyanosis, no clubbing Skin:  No rashes no nodules Neuro:  CN II through XII intact, motor grossly intact  EKG - atrial fib with appropriate sensing/pacing  DEVICE  Normal device function.  See PaceArt for details.   Assess/Plan: 1. Perm atrial fib - her rates are controlled. She does not have palpitations. She will continue eliquis. 2. PPM -her medtronic DDD PM is working normally. We will recheck in several months. 3. Insomnia - I suggested a trial of ambien. 4. HTN - her bp is reasonably well controlled. No change in meds.  .D.

## 2020-05-11 NOTE — Patient Instructions (Signed)
Medication Instructions:  Your physician recommends that you continue on your current medications as directed. Please refer to the Current Medication list given to you today.  *If you need a refill on your cardiac medications before your next appointment, please call your pharmacy*   Lab Work: None today If you have labs (blood work) drawn today and your tests are completely normal, you will receive your results only by: Marland Kitchen MyChart Message (if you have MyChart) OR . A paper copy in the mail If you have any lab test that is abnormal or we need to change your treatment, we will call you to review the results.   Testing/Procedures: None today   Follow-Up: At [facility], you and your health needs are our priority.  As part of our continuing mission to provide you with exceptional heart care, we have created designated Provider Care Teams.  These Care Teams include your primary Cardiologist (physician) and Advanced Practice Providers (APPs -  Physician Assistants and Nurse Practitioners) who all work together to provide you with the care you need, when you need it.  We recommend signing up for the patient portal called "MyChart".  Sign up information is provided on this After Visit Summary.  MyChart is used to connect with patients for Virtual Visits (Telemedicine).  Patients are able to view lab/test results, encounter notes, upcoming appointments, etc.  Non-urgent messages can be sent to your provider as well.   To learn more about what you can do with MyChart, go to [email or web address].    Your next appointment:   12 month(s)  The format for your next appointment:   In Person  Provider:   Lewayne Bunting, MD   Other Instructions 6 mons office visit with device clinic       Thank you for choosing [facility] !

## 2021-06-23 ENCOUNTER — Encounter: Payer: Medicare Other | Admitting: Internal Medicine

## 2021-07-13 ENCOUNTER — Encounter: Payer: Medicare Other | Admitting: Internal Medicine

## 2021-08-11 ENCOUNTER — Other Ambulatory Visit: Payer: Self-pay

## 2021-08-11 ENCOUNTER — Encounter: Payer: Self-pay | Admitting: Internal Medicine

## 2021-08-11 ENCOUNTER — Ambulatory Visit (INDEPENDENT_AMBULATORY_CARE_PROVIDER_SITE_OTHER): Payer: Medicare Other | Admitting: Internal Medicine

## 2021-08-11 VITALS — BP 146/90 | HR 76 | Ht 62.0 in | Wt 190.0 lb

## 2021-08-11 DIAGNOSIS — I4819 Other persistent atrial fibrillation: Secondary | ICD-10-CM

## 2021-08-11 DIAGNOSIS — I4430 Unspecified atrioventricular block: Secondary | ICD-10-CM

## 2021-08-11 NOTE — Progress Notes (Signed)
HPI [name] returns today for followup of her PPM and atrial fib. She is a pleasant 80 yo woman with dementia who resides in a SNF with perm atrial fib, sinus node dysfunction, s/p PPM insertion, HTN and insomnia.  She denies chest pain or sob. She still thinks that she is working and that her husband just left her and her 5 kids.  Allergies  Allergen Reactions   Codeine Other (See Comments)    Reaction:  Headaches    Food Nausea And Vomiting and Other (See Comments)    Pt is allergic to leafy raw vegetables and seeds.     Levaquin [Levofloxacin] Other (See Comments)    Confusion, altered mental status, inability to walk   Oxycodone Hcl Other (See Comments)    Reaction:  Headaches    Peanut-Containing Drug Products Nausea And Vomiting   Reglan [Metoclopramide] Other (See Comments)    Reaction:  Confusion      Current Outpatient Medications  Medication Sig Dispense Refill   acetaminophen (TYLENOL) 325 MG tablet Take 650 mg by mouth every 4 (four) hours as needed for mild pain, fever or headache.     amLODipine (NORVASC) 10 MG tablet Take 10 mg by mouth daily.     apixaban (ELIQUIS) 5 MG TABS tablet Take 5 mg by mouth 2 (two) times daily.      atorvastatin (LIPITOR) 10 MG tablet Take 10 mg by mouth at bedtime.      furosemide (LASIX) 20 MG tablet Take 20 mg by mouth daily.     levETIRAcetam (KEPPRA) 100 MG/ML solution Take 500 mg by mouth 2 (two) times daily.     metoprolol tartrate (LOPRESSOR) 25 MG tablet Take 12.5 mg by mouth 2 (two) times daily.      nitroGLYCERIN (NITRODUR - DOSED IN MG/24 HR) 0.4 mg/hr patch Place 0.4 mg onto the skin as needed. Give 1 tablet sublingually every 5 minutes as needed fo chest pain     famotidine (PEPCID) 20 MG tablet Take 20 mg by mouth daily. (Patient not taking: Reported on 08/11/2021)     ferrous sulfate 325 (65 FE) MG tablet Take 325 mg by mouth daily with breakfast. (Patient not taking: Reported on 08/11/2021)     gabapentin (NEURONTIN)  100 MG capsule Take 200 mg by mouth at bedtime. (Patient not taking: Reported on 08/11/2021)     linaclotide (LINZESS) 145 MCG CAPS capsule Take 145 mcg by mouth daily before breakfast. (Patient not taking: Reported on 08/11/2021)     Melatonin 5 MG TABS Take 5 mg by mouth at bedtime as needed (for sleep).  (Patient not taking: Reported on 08/11/2021)     potassium chloride (K-DUR,KLOR-CON) 10 MEQ tablet Take 10 mEq by mouth daily. (Patient not taking: Reported on 08/11/2021)     senna-docusate (SENOKOT-S) 8.6-50 MG tablet Take 1 tablet by mouth 2 (two) times daily. (Patient not taking: Reported on 08/11/2021)     sertraline (ZOLOFT) 50 MG tablet Take 50 mg by mouth daily. (Patient not taking: Reported on 08/11/2021)     traMADol (ULTRAM) 50 MG tablet Take 50 mg by mouth every 4 (four) hours as needed for moderate pain. Give 1 tablet by mouth every 4 hours as needed for pain (Patient not taking: Reported on 08/11/2021)     traZODone (DESYREL) 50 MG tablet Take 50 mg by mouth at bedtime. (Patient not taking: Reported on 08/11/2021)     Vitamin D, Ergocalciferol, (DRISDOL) 1.25 MG (50000 UT)  CAPS capsule Take 50,000 Units by mouth every 7 (seven) days. (Patient not taking: Reported on 08/11/2021)     No current facility-administered medications for this visit.     Past Medical History:  Diagnosis Date   Anemia    Anginal pain (HCC)    "all the time, but not heart related"   Arthritis    "legs and back" (09/30/2013)   Asthma    years ago   Atrial fibrillation (HCC)    AV block 1993   s/p dual-chamber PPM, 1993, Medtronic Kappa; generator change-2003    Blurred vision, bilateral    sometimes double vision   CAD (coronary artery disease)    multivessel, CABG 6/10.  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  Myoview (11/20/11) showed no ischemia & EF 62%   Chronic lower back pain    Confusion    COPD (chronic obstructive pulmonary disease) (HCC)    severe lung disease by PFTs 6/12   DDD (degenerative disc disease)    Deep vein  thrombosis, upper right extremity (HCC)    post-PPM   Dehiscence of closure of sternum or sternotomy 07/2009   sternal infection; required flap closure   Dementia (HCC)    Diabetes mellitus, type II (HCC)    Gastroesophageal reflux disease    not much   H/O hiatal hernia    History of kidney stones    Hyperlipidemia    Hypertension    Migraines    "years ago" (09/30/2013)   Obesity    Orthopnea    sometimes   Pacemaker    PUD (peptic ulcer disease)    Seizures (HCC)    "long time ago; don't remember what they were related to" (09/30/2013)none recent   Sleep apnea    denies CPAP use on 09/30/2013   Stroke (HCC)    years ago, "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   Past Surgical History:  Procedure Laterality Date   ABDOMINAL HYSTERECTOMY     partial   APPENDECTOMY     BACK SURGERY     x3 total   CESAREAN SECTION  ~1980   CHOLECYSTECTOMY     COLONOSCOPY  04/27/2005   hemorrhoids, diverticula   COLONOSCOPY WITH ESOPHAGOGASTRODUODENOSCOPY (EGD) N/A 02/20/2013   RMR: pancolonic diverticulosis and internal hemorrhoids. EGD with small hiatal hernia, healed gastric ulcer   CORONARY ARTERY BYPASS GRAFT  05/1999   - LIMA to LAD, SVG to OM, SVG to PDA   ESOPHAGOGASTRODUODENOSCOPY     with [facility] dilation,  biopsy, and disruption Schatzki's ring   ESOPHAGOGASTRODUODENOSCOPY  09/10/2012   RMR: Noncritical Schatzki's ring. Diffuse gastric erosions and an  area of partially healing ulceration-status post biopsy/ Small hiatal hernia. Bx reactive gastropathy.   INSERT / REPLACE / REMOVE PACEMAKER  1993   JOINT REPLACEMENT     LUMBAR DISC SURGERY     PACEMAKER PLACEMENT  1993   Status post DDD pacemaker implantation in 1993, with Medtronic Kappa generator change in 2003 for  second-degree AV block, most recent gen change by JA 04/14/11   PORTACATH PLACEMENT Left 07/17/2013   Procedure: INSERTION  PORT-A-CATH-left subclavian;  Surgeon: Fabio Bering, MD;  Location: [facility];  Service: General;  Laterality: Left;   POSTERIOR LUMBAR FUSION     RECONSTRUCTIVE REPAIR STERNAL     for infection s/p CABG 8/10   REPLACEMENT TOTAL KNEE Bilateral  THYROIDECTOMY     TOTAL HIP ARTHROPLASTY Right    TOTAL HIP ARTHROPLASTY Left 12/03/2013   Procedure: LEFT TOTAL HIP ARTHROPLASTY ANTERIOR APPROACH;  Surgeon: Shelda Pal, MD;  Location: [facility];  Service: Orthopedics;  Laterality: Left;   TUBAL LIGATION     WRIST SURGERY Left    "tumor taken off" (09/30/2013)     Family History  Problem Relation Age of Onset   Heart disease Mother        MI   Colon cancer Father        age 42   Diabetes Other    Coronary artery disease Other        Female <55   Cancer Other      Social History   Socioeconomic History   Marital status: Widowed    Spouse name: Not on file   Number of children: 5   Years of education: Not on file   Highest education level: Not on file  Occupational History    Employer: RETIRED  Tobacco Use   Smoking status: Never   Smokeless tobacco: Former    Types: Snuff   Tobacco comments:    09/30/2013 "stopped snuff a long time ago"  Vaping Use   Vaping Use: Never used  Substance and Sexual Activity   Alcohol use: No   Drug use: No   Sexual activity: Never    Birth control/protection: Post-menopausal, Surgical  Other Topics Concern   Not on file  Social History Narrative   Lives at home with daughter, son-in-law   Caffeine use-  none   Social Determinants of Health   Financial Resource Strain: Not on file  Food Insecurity: Not on file  Transportation Needs: Not on file  Physical Activity: Not on file  Stress: Not on file  Social Connections: Not on file  Intimate Partner Violence: Not on file     BP (!) 146/90   Pulse 76   Ht 5\' 2"  (1.575 m)   Wt 190 lb (86.2 kg)   SpO2 98%   BMI 34.75 kg/m   Physical Exam:  Well appearing NAD HEENT:  Unremarkable Neck:  No JVD, no thyromegally Lymphatics:  No adenopathy Back:  No CVA tenderness Lungs:  Clear  with no wheezes HEART:  Regular rate rhythm, no murmurs, no rubs, no clicks Abd:  soft, positive bowel sounds, no organomegally, no rebound, no guarding Ext:  2 plus pulses, no edema, no cyanosis, no clubbing Skin:  No rashes no nodules Neuro:  CN II through XII intact, motor grossly intact   DEVICE  Normal device function.  See PaceArt for details.   Assess/Plan:  1. Perm atrial fib - her rates are controlled. She does not have palpitations. She will continue eliquis. 2. PPM -her medtronic DDD PM is working normally. We will recheck in several months. She is programmed VVI. 3. Insomnia - she is not complaining of this today 4. HTN - her bp is reasonably well controlled. No change in meds.   .D.

## 2021-08-11 NOTE — Patient Instructions (Signed)

## 2022-02-16 ENCOUNTER — Ambulatory Visit (INDEPENDENT_AMBULATORY_CARE_PROVIDER_SITE_OTHER): Payer: Medicare Other

## 2022-02-16 ENCOUNTER — Other Ambulatory Visit: Payer: Self-pay

## 2022-02-16 ENCOUNTER — Encounter (INDEPENDENT_AMBULATORY_CARE_PROVIDER_SITE_OTHER): Payer: Self-pay

## 2022-02-16 DIAGNOSIS — I4430 Unspecified atrioventricular block: Secondary | ICD-10-CM

## 2022-02-16 LAB — CUP PACEART INCLINIC DEVICE CHECK
Battery Impedance: 2350 Ohm
Battery Remaining Longevity: 34 mo
Battery Voltage: 2.74 V
Brady Statistic RV Percent Paced: 95 %
Date Time Interrogation Session: 20230315102357
Implantable Lead Implant Date: 19930812
Implantable Lead Implant Date: 19930812
Implantable Lead Location: 753859
Implantable Lead Location: 753860
Implantable Lead Model: 501154
Implantable Pulse Generator Implant Date: 20120510
Lead Channel Impedance Value: 642 Ohm
Lead Channel Impedance Value: 67 Ohm
Lead Channel Pacing Threshold Amplitude: 1.25 V
Lead Channel Pacing Threshold Amplitude: 1.5 V
Lead Channel Pacing Threshold Pulse Width: 0.4 ms
Lead Channel Pacing Threshold Pulse Width: 0.4 ms
Lead Channel Sensing Intrinsic Amplitude: 11.2 mV
Lead Channel Setting Pacing Amplitude: 2.5 V
Lead Channel Setting Pacing Pulse Width: 0.4 ms
Lead Channel Setting Sensing Sensitivity: 4 mV

## 2022-02-16 NOTE — Progress Notes (Signed)
Pacemaker check in clinic. Normal device function. Thresholds, sensing, impedances consistent with previous measurements. Device programmed to maximize longevity. high ventricular rates noted. Device programmed at appropriate safety margins. Histogram distribution appropriate for patient activity level. Device programmed to optimize intrinsic conduction. Estimated longevity 35 months. ROV with GT/RDS 08/23/22 ?

## 2022-06-03 ENCOUNTER — Encounter (HOSPITAL_COMMUNITY): Payer: Self-pay | Admitting: Emergency Medicine

## 2022-06-03 ENCOUNTER — Other Ambulatory Visit: Payer: Self-pay

## 2022-06-03 ENCOUNTER — Emergency Department (HOSPITAL_COMMUNITY): Payer: Medicare Other

## 2022-06-03 ENCOUNTER — Emergency Department (HOSPITAL_COMMUNITY)
Admission: EM | Admit: 2022-06-03 | Discharge: 2022-06-03 | Disposition: A | Discharge status: Skilled Nursing Facility | Payer: Medicare Other | Attending: Emergency Medicine | Admitting: Emergency Medicine

## 2022-06-03 DIAGNOSIS — Z7901 Long term (current) use of anticoagulants: Secondary | ICD-10-CM | POA: Diagnosis not present

## 2022-06-03 DIAGNOSIS — Z79899 Other long term (current) drug therapy: Secondary | ICD-10-CM | POA: Insufficient documentation

## 2022-06-03 DIAGNOSIS — M25511 Pain in right shoulder: Secondary | ICD-10-CM | POA: Insufficient documentation

## 2022-06-03 DIAGNOSIS — M25512 Pain in left shoulder: Secondary | ICD-10-CM | POA: Insufficient documentation

## 2022-06-03 DIAGNOSIS — Z91010 Allergy to peanuts: Secondary | ICD-10-CM | POA: Diagnosis not present

## 2022-06-03 MED ORDER — ACETAMINOPHEN 325 MG PO TABS
650.0000 mg | ORAL_TABLET | Freq: Four times a day (QID) | ORAL | Status: DC | PRN
  Administered 2022-06-03: 650 mg via ORAL
  Filled 2022-06-03: qty 2

## 2022-06-03 NOTE — ED Triage Notes (Signed)
Pt to ER via EMS from [facility] memory care.  Pt c/o left neck pain that radiates into shoulder.  Per EMS able to recreate pain with palpation.  Pt states awoke with pain this AM.

## 2022-06-03 NOTE — Discharge Instructions (Signed)
Tylenol for pain

## 2022-06-03 NOTE — ED Provider Notes (Signed)
[facility] EMERGENCY DEPARTMENT Provider Note   CSN: [id number] Arrival date & time: 06/03/22  1221     History  Chief Complaint  Patient presents with   Shoulder Pain    [name] is a 81 y.o. female.  Patient complains of pain in bilateral shoulders.  Patient was sent here from the facility where she resides due to complaining of pain in her left shoulder.  The facility does not believe patient had any fall or any injury.  Patient states she did not have any fall or any injury.  Patient tells me now that she has pain in both of her shoulders.  She states that she frequently has shoulder pain  The history is provided by the patient. No language interpreter was used.  Shoulder Pain      Home Medications Prior to Admission medications   Medication Sig Start Date End Date Taking? Authorizing Provider  acetaminophen (TYLENOL) 325 MG tablet Take 650 mg by mouth every 4 (four) hours as needed for mild pain, fever or headache.    [provider]  amLODipine (NORVASC) 10 MG tablet Take 10 mg by mouth daily. 08/07/21   [provider]  apixaban (ELIQUIS) 5 MG TABS tablet Take 5 mg by mouth 2 (two) times daily.     [provider]  atorvastatin (LIPITOR) 10 MG tablet Take 10 mg by mouth at bedtime.     [provider]  famotidine (PEPCID) 20 MG tablet Take 20 mg by mouth daily. Patient not taking: Reported on 08/11/2021    [provider]  ferrous sulfate 325 (65 FE) MG tablet Take 325 mg by mouth daily with breakfast. Patient not taking: Reported on 08/11/2021    [provider]  furosemide (LASIX) 20 MG tablet Take 20 mg by mouth daily. 07/17/21   [provider]  gabapentin (NEURONTIN) 100 MG capsule Take 200 mg by mouth at bedtime. Patient not taking: Reported on 08/11/2021    [provider]  levETIRAcetam (KEPPRA) 100 MG/ML solution Take 500 mg by mouth 2 (two) times daily. 08/02/21   [provider]   linaclotide Karlene Einstein) 145 MCG CAPS capsule Take 145 mcg by mouth daily before breakfast. Patient not taking: Reported on 08/11/2021    [provider]  Melatonin 5 MG TABS Take 5 mg by mouth at bedtime as needed (for sleep).  Patient not taking: Reported on 08/11/2021    [provider]  metoprolol tartrate (LOPRESSOR) 25 MG tablet Take 12.5 mg by mouth 2 (two) times daily.     [provider]  nitroGLYCERIN (NITRODUR - DOSED IN MG/24 HR) 0.4 mg/hr patch Place 0.4 mg onto the skin as needed. Give 1 tablet sublingually every 5 minutes as needed fo chest pain    [provider]  potassium chloride (K-DUR,KLOR-CON) 10 MEQ tablet Take 10 mEq by mouth daily. Patient not taking: Reported on 08/11/2021    [provider]  senna-docusate (SENOKOT-S) 8.6-50 MG tablet Take 1 tablet by mouth 2 (two) times daily. Patient not taking: Reported on 08/11/2021    [provider]  sertraline (ZOLOFT) 50 MG tablet Take 50 mg by mouth daily. Patient not taking: Reported on 08/11/2021    [provider]  traMADol (ULTRAM) 50 MG tablet Take 50 mg by mouth every 4 (four) hours as needed for moderate pain. Give 1 tablet by mouth every 4 hours as needed for pain Patient not taking: Reported on 08/11/2021    [provider]  traZODone (DESYREL) 50 MG tablet Take 50 mg by mouth at bedtime. Patient not taking: Reported on 08/11/2021    [provider]  Vitamin D, Ergocalciferol, (DRISDOL) 1.25 MG (50000 UT) CAPS capsule Take 50,000 Units by mouth every 7 (seven) days. Patient not taking: Reported on 08/11/2021    [provider]      Allergies    Codeine, Food, Levaquin [levofloxacin], Oxycodone hcl, Peanut-containing drug products, and Reglan [metoclopramide]    Review of Systems   Review of Systems  All other systems reviewed and are negative.   Physical Exam Updated Vital Signs BP (!) 156/96   Pulse 69   Temp 97.8 F (36.6 C) (Oral)    Resp 18   Ht 5\' 2"  (1.575 m)   Wt 86 kg   SpO2 97%   BMI 34.68 kg/m  Physical Exam Vitals and nursing note reviewed.  Constitutional:      Appearance: She is well-developed.  HENT:     Head: Normocephalic.  Eyes:     Pupils: Pupils are equal, round, and reactive to light.  Cardiovascular:     Rate and Rhythm: Normal rate.  Pulmonary:     Effort: Pulmonary effort is normal.  Abdominal:     General: There is no distension.  Musculoskeletal:        General: Normal range of motion.     Cervical back: Normal range of motion and neck supple. No tenderness.     Comments: Tender bilat shoulders, pain with movement, nv and ns intact   Skin:    General: Skin is warm.  Neurological:     General: No focal deficit present.     Mental Status: She is alert and oriented to person, place, and time.  Psychiatric:        Mood and Affect: Mood normal.     ED Results / Procedures / Treatments   Labs (all labs ordered are listed, but only abnormal results are displayed) Labs Reviewed - No data to display  EKG None  Radiology DG Shoulder Right  Result Date: 06/03/2022 CLINICAL DATA:  pain EXAM: RIGHT SHOULDER - 2+ VIEW COMPARISON:  None Available. FINDINGS: No evidence of acute fracture or joint malalignment. Moderate acromioclavicular degenerative change. Evidence of rotator cuff arthropathy. IMPRESSION: No evidence of acute fracture or joint malalignment. Electronically Signed   By: 06/05/2022 M.D.   On: 06/03/2022 13:51   DG Shoulder Left  Result Date: 06/03/2022 CLINICAL DATA:  Pain without trauma EXAM: LEFT SHOULDER - 2+ VIEW COMPARISON:  11/01/2018 FINDINGS: Acromioclavicular spurring. Small subchondral cysts or geodes in the humeral head. No fracture or dislocation. Left subclavian port catheter partially visualized. IMPRESSION: Mild glenohumeral and acromioclavicular degenerative change. No acute findings. Electronically Signed   By: 11/03/2018 M.D.   On: 06/03/2022 13:49     Procedures Procedures    Medications Ordered in ED Medications - No data to display  ED Course/ Medical Decision Making/ A&P                           Medical Decision Making Amount and/or Complexity of Data Reviewed Radiology: ordered.   Xrays show no fracture  ostearthritis        Final Clinical Impression(s) / ED Diagnoses Final diagnoses:  Acute pain of both shoulders    Rx / DC Orders ED Discharge Orders     None      An After Visit Summary  was printed and given to the patient.    Elson Areas, Cordelia Poche 06/03/22 1620    Bethann Berkshire, MD 06/03/22 [phone]

## 2022-08-23 ENCOUNTER — Ambulatory Visit: Payer: Medicare Other | Attending: Internal Medicine | Admitting: Internal Medicine

## 2022-08-23 ENCOUNTER — Encounter: Payer: Self-pay | Admitting: Internal Medicine

## 2022-08-23 VITALS — BP 140/84 | HR 66 | Ht 62.0 in | Wt 158.3 lb

## 2022-08-23 DIAGNOSIS — I4891 Unspecified atrial fibrillation: Secondary | ICD-10-CM | POA: Insufficient documentation

## 2022-08-23 MED ORDER — METOPROLOL TARTRATE 25 MG PO TABS: 25.0000 mg | ORAL_TABLET | Freq: Two times a day (BID) | ORAL | 3 refills | Status: AC

## 2022-08-23 MED ORDER — FUROSEMIDE 40 MG PO TABS: 40.0000 mg | ORAL_TABLET | Freq: Every day | ORAL | 3 refills | Status: AC

## 2022-08-23 NOTE — Patient Instructions (Signed)
Medication Instructions:   Stop Amlodipine  Increase Lasix to 40 mg Daily  Increase Lopressor to 25 mg Two Times Daily   *If you need a refill on your cardiac medications before your next appointment, please call your pharmacy*   Lab Work: NONE   If you have labs (blood work) drawn today and your tests are completely normal, you will receive your results only by: [place] (if you have MyChart) OR A paper copy in the mail If you have any lab test that is abnormal or we need to change your treatment, we will call you to review the results.   Testing/Procedures: NONE    Follow-Up: At [facility], you and your health needs are our priority.  As part of our continuing mission to provide you with exceptional heart care, we have created designated Provider Care Teams.  These Care Teams include your primary Cardiologist (physician) and Advanced Practice Providers (APPs -  Physician Assistants and Nurse Practitioners) who all work together to provide you with the care you need, when you need it.  We recommend signing up for the patient portal called "MyChart".  Sign up information is provided on this After Visit Summary.  MyChart is used to connect with patients for Virtual Visits (Telemedicine).  Patients are able to view lab/test results, encounter notes, upcoming appointments, etc.  Non-urgent messages can be sent to your provider as well.   To learn more about what you can do with MyChart, go to [email or web address].    Your next appointment:   1 year(s)  The format for your next appointment:   In Person  Provider:   Cristopher Peru, MD    Other Instructions Thank you for choosing [facility]!    Important Information About Sugar

## 2022-08-23 NOTE — Progress Notes (Signed)
HPI [name] returns today for followup of her PPM and atrial fib. She is a pleasant 81 yo woman with dementia who resides in a SNF with perm atrial fib, sinus node dysfunction, s/p PPM insertion, HTN and insomnia.  She denies chest pain or sob.  Allergies  Allergen Reactions   Codeine Other (See Comments)    Reaction:  Headaches    Food Nausea And Vomiting and Other (See Comments)    Pt is allergic to leafy raw vegetables and seeds.     Levaquin [Levofloxacin] Other (See Comments)    Confusion, altered mental status, inability to walk   Oxycodone Hcl Other (See Comments)    Reaction:  Headaches    Peanut-Containing Drug Products Nausea And Vomiting   Reglan [Metoclopramide] Other (See Comments)    Reaction:  Confusion      Current Outpatient Medications  Medication Sig Dispense Refill   acetaminophen (TYLENOL) 325 MG tablet Take 650 mg by mouth every 4 (four) hours as needed for mild pain, fever or headache.     amLODipine (NORVASC) 10 MG tablet Take 10 mg by mouth daily.     apixaban (ELIQUIS) 5 MG TABS tablet Take 5 mg by mouth 2 (two) times daily.      dorzolamide (TRUSOPT) 2 % ophthalmic solution 1 drop 3 (three) times daily.     furosemide (LASIX) 20 MG tablet Take 20 mg by mouth daily.     levETIRAcetam (KEPPRA) 100 MG/ML solution Take 500 mg by mouth 2 (two) times daily.     Melatonin 5 MG TABS Take 5 mg by mouth at bedtime as needed (for sleep).     metoprolol tartrate (LOPRESSOR) 25 MG tablet Take 12.5 mg by mouth 2 (two) times daily.      nitroGLYCERIN (NITRODUR - DOSED IN MG/24 HR) 0.4 mg/hr patch Place 0.4 mg onto the skin as needed. Give 1 tablet sublingually every 5 minutes as needed fo chest pain     RHOPRESSA 0.02 % SOLN      SPIRIVA HANDIHALER 18 MCG inhalation capsule 1 capsule daily.     atorvastatin (LIPITOR) 10 MG tablet Take 10 mg by mouth at bedtime.  (Patient not taking: Reported on 08/23/2022)     famotidine (PEPCID) 20 MG tablet Take 20 mg by  mouth daily. (Patient not taking: Reported on 08/11/2021)     ferrous sulfate 325 (65 FE) MG tablet Take 325 mg by mouth daily with breakfast. (Patient not taking: Reported on 08/11/2021)     gabapentin (NEURONTIN) 100 MG capsule Take 200 mg by mouth at bedtime. (Patient not taking: Reported on 08/11/2021)     linaclotide (LINZESS) 145 MCG CAPS capsule Take 145 mcg by mouth daily before breakfast. (Patient not taking: Reported on 08/11/2021)     potassium chloride (K-DUR,KLOR-CON) 10 MEQ tablet Take 10 mEq by mouth daily. (Patient not taking: Reported on 08/11/2021)     senna-docusate (SENOKOT-S) 8.6-50 MG tablet Take 1 tablet by mouth 2 (two) times daily. (Patient not taking: Reported on 08/11/2021)     sertraline (ZOLOFT) 50 MG tablet Take 50 mg by mouth daily. (Patient not taking: Reported on 08/11/2021)     traMADol (ULTRAM) 50 MG tablet Take 50 mg by mouth every 4 (four) hours as needed for moderate pain. Give 1 tablet by mouth every 4 hours as needed for pain (Patient not taking: Reported on 08/11/2021)     traZODone (DESYREL) 50 MG tablet Take 50 mg by mouth at  bedtime. (Patient not taking: Reported on 08/11/2021)     Vitamin D, Ergocalciferol, (DRISDOL) 1.25 MG (50000 UT) CAPS capsule Take 50,000 Units by mouth every 7 (seven) days. (Patient not taking: Reported on 08/11/2021)     No current facility-administered medications for this visit.     Past Medical History:  Diagnosis Date   Anemia    Anginal pain (HCC)    "all the time, but not heart related"   Arthritis    "legs and back" (09/30/2013)   Asthma    years ago   Atrial fibrillation (HCC)    AV block 1993   s/p dual-chamber PPM, 1993, Medtronic Kappa; generator change-2003    Blurred vision, bilateral    sometimes double vision   CAD (coronary artery disease)    multivessel, CABG 6/10.  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  Myoview (11/20/11) showed no ischemia & EF 62%   Chronic lower back pain    Confusion    COPD (chronic obstructive pulmonary disease)  (HCC)    severe lung disease by PFTs 6/12   DDD (degenerative disc disease)    Deep vein thrombosis, upper right extremity (HCC)    post-PPM   Dehiscence of closure of sternum or sternotomy 07/2009   sternal infection; required flap closure   Dementia (HCC)    Diabetes mellitus, type II (HCC)    Gastroesophageal reflux disease    not much   H/O hiatal hernia    History of kidney stones    Hyperlipidemia    Hypertension    Migraines    "years ago" (09/30/2013)   Obesity    Orthopnea    sometimes   Pacemaker    PUD (peptic ulcer disease)    Seizures (HCC)    "long time ago; don't remember what they were related to" (09/30/2013)none recent   Sleep apnea    denies CPAP use on 09/30/2013   Stroke (HCC)    years ago, "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   Past Surgical History:  Procedure Laterality Date   ABDOMINAL HYSTERECTOMY     partial   APPENDECTOMY     BACK SURGERY     x3 total   CESAREAN SECTION  ~1980   CHOLECYSTECTOMY     COLONOSCOPY  04/27/2005   hemorrhoids, diverticula   COLONOSCOPY WITH ESOPHAGOGASTRODUODENOSCOPY (EGD) N/A 02/20/2013   RMR: pancolonic diverticulosis and internal hemorrhoids. EGD with small hiatal hernia, healed gastric ulcer   CORONARY ARTERY BYPASS GRAFT  05/1999   - LIMA to LAD, SVG to OM, SVG to PDA   ESOPHAGOGASTRODUODENOSCOPY     with [facility] dilation,  biopsy, and disruption Schatzki's ring   ESOPHAGOGASTRODUODENOSCOPY  09/10/2012   RMR: Noncritical Schatzki's ring. Diffuse gastric erosions and an  area of partially healing ulceration-status post biopsy/ Small hiatal hernia. Bx reactive gastropathy.   INSERT / REPLACE / REMOVE PACEMAKER  1993   JOINT REPLACEMENT     LUMBAR DISC SURGERY     PACEMAKER PLACEMENT  1993   Status post DDD pacemaker implantation in 1993, with Medtronic Kappa generator change in 2003 for  second-degree AV block, most  recent gen change by JA 04/14/11   PORTACATH PLACEMENT Left 07/17/2013   Procedure: INSERTION PORT-A-CATH-left subclavian;  Surgeon: Fabio Bering, MD;  Location: [facility];  Service: General;  Laterality: Left;   POSTERIOR LUMBAR FUSION     RECONSTRUCTIVE  REPAIR STERNAL     for infection s/p CABG 8/10   REPLACEMENT TOTAL KNEE Bilateral    THYROIDECTOMY     TOTAL HIP ARTHROPLASTY Right    TOTAL HIP ARTHROPLASTY Left 12/03/2013   Procedure: LEFT TOTAL HIP ARTHROPLASTY ANTERIOR APPROACH;  Surgeon: Shelda Pal, MD;  Location: [facility];  Service: Orthopedics;  Laterality: Left;   TUBAL LIGATION     WRIST SURGERY Left    "tumor taken off" (09/30/2013)     Family History  Problem Relation Age of Onset   Heart disease Mother        MI   Colon cancer Father        age 77   Diabetes Other    Coronary artery disease Other        Female <55   Cancer Other      Social History   Socioeconomic History   Marital status: Widowed    Spouse name: Not on file   Number of children: 5   Years of education: Not on file   Highest education level: Not on file  Occupational History    Employer: RETIRED  Tobacco Use   Smoking status: Never   Smokeless tobacco: Former    Types: Snuff   Tobacco comments:    09/30/2013 "stopped snuff a long time ago"  Vaping Use   Vaping Use: Never used  Substance and Sexual Activity   Alcohol use: No   Drug use: No   Sexual activity: Never    Birth control/protection: Post-menopausal, Surgical  Other Topics Concern   Not on file  Social History Narrative   Lives at home with daughter, son-in-law   Caffeine use-  none   Social Determinants of Health   Financial Resource Strain: Not on file  Food Insecurity: Not on file  Transportation Needs: Not on file  Physical Activity: Not on file  Stress: Not on file  Social Connections: Not on file  Intimate Partner Violence: Not on file     BP (!) 140/84   Pulse 66   Ht 5\' 2"  (1.575 m)   Wt 158 lb 4.8  oz (71.8 kg)   SpO2 99%   BMI 28.95 kg/m   Physical Exam:  Well appearing NAD HEENT: Unremarkable Neck:  No JVD, no thyromegally Lymphatics:  No adenopathy Back:  No CVA tenderness Lungs:  Clear HEART:  Regular rate rhythm, no murmurs, no rubs, no clicks Abd:  soft, positive bowel sounds, no organomegally, no rebound, no guarding Ext:  2 plus pulses, 2+ edema, no cyanosis, no clubbing Skin:  No rashes no nodules Neuro:  CN II through XII intact, motor grossly intact  EKG  DEVICE  Normal device function.  See PaceArt for details.   Assess/Plan:  1. Perm atrial fib - her rates are controlled. She does not have palpitations. She will continue eliquis. 2. PPM -her medtronic DDD PM is working normally. We will recheck in several months. She is programmed VVI. 3. Insomnia - she is not complaining of this today 4. HTN - her bp is reasonably well controlled. No change in meds. 5. Peripheral edema - I have adjusted her meds. I asked her to stop amlodipine, increase toprol and increase lasix.    .D

## 2023-01-01 LAB — LAB REPORT - SCANNED: EGFR: 30

## 2023-02-13 ENCOUNTER — Encounter: Payer: Self-pay | Admitting: *Deleted

## 2023-03-09 ENCOUNTER — Telehealth: Payer: Self-pay | Admitting: Internal Medicine

## 2023-03-09 NOTE — Telephone Encounter (Signed)
   Pre-operative Risk Assessment    Patient Name: [name]  DOB: [birth year] MRN: [id number]      Request for Surgical Clearance    Procedure:   extracorporeal shockwave lithotripsy  Date of Surgery:  Clearance 03/15/23                                 Surgeon:  Dr. Edwin Dada Surgeon's Group or Practice Name:  [facility] Phone number:  [phone] ask for Jamey Ripa number:  [phone] or [phone]   Type of Clearance Requested:   - Medical    Type of Anesthesia:   Not indicated   Additional requests/questions:   Per [facility] Protocol we must have the following information for clearance: 1. Cardiac clearance note byt the provider clearly stating patient is cleared. 2. Copy of most recent EKG dated and signed by the provider. 3. Clear instruction for management of anticoagulation medicine (s).  Note Pacemaker/AICD patients-please attach interrogation documentation. Interrogation must be within 6 months of surgery date.   Signed, Hipolito Bayley   03/09/2023, 8:20 AM

## 2023-03-09 NOTE — Telephone Encounter (Signed)
Preoperative team, patient has not had recent labs drawn.  She will need to have a CBC and BMP drawn so that pharmacy may make recommendations on holding Eliquis/apixaban medication.  Please order CBC and BMP and contact patient to let her know that labs need to be drawn.  Thank you for your help.  Jossie Ng. Faruq Rosenberger NP-C     03/09/2023, 12:39 PM [place] [address] Office [phone] Fax [phone]

## 2023-03-09 NOTE — Telephone Encounter (Signed)
Call placed to [facility], spoke with [facility], [place].  She will have labs drawn tomorrow morning, 03/10/23, and fax the results to [place].    Pt's surgery is 03/15/23, will pt need a phone call as well, so we can get it within the time frame in order for her to hold her Eliquis?

## 2023-03-09 NOTE — Telephone Encounter (Signed)
Patient with diagnosis of afib and remote DVT on Eliquis for anticoagulation.    Procedure: extracorporeal shockwave lithotripsy  Date of procedure: 03/15/23  CHA2DS2-VASc Score = 9  This indicates a 12.2% annual risk of stroke. The patient's score is based upon: CHF History: 1 HTN History: 1 Diabetes History: 1 Stroke History: 2 Vascular Disease History: 1 Age Score: 2 Gender Score: 1  Pt has not had labws drawn since June 2022 and requires updated BMET and CBC before clearance can be provided. Based on elevated CV risk, prefer to minimize time off anticoag as much as possible, ideally with only 1-2 day anticoag hold prior to procedure.  **This guidance is not considered finalized until pre-operative APP has relayed final recommendations.**

## 2023-03-10 NOTE — Telephone Encounter (Signed)
CORRECTION: WRONG LABS. WE ARE WAITING FOR LABS TO COME FROM [facility] [place].

## 2023-03-10 NOTE — Telephone Encounter (Signed)
I will forward back to pre op APP to review labs results that in the chart, that we have requested. Pt's procedure is 03/15/23.

## 2023-03-13 ENCOUNTER — Ambulatory Visit: Payer: Medicare Other | Attending: General Practice

## 2023-03-13 ENCOUNTER — Telehealth: Payer: Self-pay | Admitting: *Deleted

## 2023-03-13 DIAGNOSIS — Z01810 Encounter for preprocedural cardiovascular examination: Secondary | ICD-10-CM

## 2023-03-13 NOTE — Telephone Encounter (Signed)
Can we see if labs are back on this pt? Procedure is in 2 days and will need to start holding anticoag soon, thanks!

## 2023-03-13 NOTE — Telephone Encounter (Signed)
Caller is following up on patient's clearance to hold Eliquis.

## 2023-03-13 NOTE — Telephone Encounter (Signed)
Candace from [facility] called back and states that they have tried x 5 with 3 phlebotomist and 2 RN's to get the lab work needed, though unsuccessful. I did ask if they had recent labs, BMET, CBC. Candace states they have from 01/01/23. I asked her to fax me those labs as maybe we may be able to use labs from 01/01/23.    I have received the 01/01/23 labs from [facility].   BMET: CREATININE 1.56 BUN: 53.0 K+: 4.3 BUN/CREAT RATIO: 31.9 eGFR: 30

## 2023-03-13 NOTE — Telephone Encounter (Signed)
I tried x 2 this morning to see about lab results, though no answer at [facility].

## 2023-03-13 NOTE — Telephone Encounter (Signed)
I s/w Associate Professor at [facility]. I have informed her that the pt will need to have a tele visit today at 3:20 before we can sign off on clearance. Candace did ask if pt needed to hold her Eliquis. I did review with the instructions with Candace. Candace, will have Monique, LPN  for the pt today be on the tele pre op appt. I did state that Gabriel Rung will need to be there by the pt, Candace said that it is fine.   We will call the PH# [phone] [facility] and ask to s/w Moniuqe, LPN @ 2:80, pt has been added on today due to med hold and date of procedure.   We agreed to fax notes to [facility] as well for the pt's chart there as well as faxing clearance notes to the surgeon's office. Candace did ask when should pt resume Eliquis. I advised that will need to addressed by Dr. Gershon Mussel as he will be able to assess bleeding standpoint. I did state generally it is resume the day of procedure or the next day, but again will need to come from Dr. Gershon Mussel. Candace and thanked each other for the team work in caring for the pt.   Meds not review, but consent has been given.      Patient Consent for Virtual Visit        [name] has provided verbal consent on 03/13/2023 for a virtual visit (video or telephone).   CONSENT FOR VIRTUAL VISIT FOR:  [name]  By participating in this virtual visit I agree to the following:  I hereby voluntarily request, consent and authorize [facility] and its employed or contracted physicians, physician assistants, nurse practitioners or other licensed health care professionals (the Practitioner), to provide me with telemedicine health care services (the "Services") as deemed necessary by the treating Practitioner. I acknowledge and consent to receive the Services by the Practitioner via telemedicine. I understand that the telemedicine visit will involve communicating with the Practitioner through live audiovisual communication  technology and the disclosure of certain medical information by electronic transmission. I acknowledge that I have been given the opportunity to request an in-person assessment or other available alternative prior to the telemedicine visit and am voluntarily participating in the telemedicine visit.  I understand that I have the right to withhold or withdraw my consent to the use of telemedicine in the course of my care at any time, without affecting my right to future care or treatment, and that the Practitioner or I may terminate the telemedicine visit at any time. I understand that I have the right to inspect all information obtained and/or recorded in the course of the telemedicine visit and may receive copies of available information for a reasonable fee.  I understand that some of the potential risks of receiving the Services via telemedicine include:  Delay or interruption in medical evaluation due to technological equipment failure or disruption; Information transmitted may not be sufficient (e.g. poor resolution of images) to allow for appropriate medical decision making by the Practitioner; and/or  In rare instances, security protocols could fail, causing a breach of personal health information.  Furthermore, I acknowledge that it is my responsibility to provide information about my medical history, conditions and care that is complete and accurate to the best of my ability. I acknowledge that Practitioner's advice, recommendations, and/or decision may be based on factors not within their control, such as incomplete or inaccurate data provided by me or  distortions of diagnostic images or specimens that may result from electronic transmissions. I understand that the practice of medicine is not an exact science and that Practitioner makes no warranties or guarantees regarding treatment outcomes. I acknowledge that a copy of this consent can be made available to me via my patient portal [facility]  MyChart), or I can request a printed copy by calling the office of [facility].    I understand that my insurance will be billed for this visit.   I have read or had this consent read to me. I understand the contents of this consent, which adequately explains the benefits and risks of the Services being provided via telemedicine.  I have been provided ample opportunity to ask questions regarding this consent and the Services and have had my questions answered to my satisfaction. I give my informed consent for the services to be provided through the use of telemedicine in my medical care

## 2023-03-13 NOTE — Telephone Encounter (Signed)
I s/w Associate Professor at [facility]. I have informed her that the pt will need to have a tele visit today at 3:20 before we can sign off on clearance. Candace did ask if pt needed to hold her Eliquis. I did review with the instructions with Candace. Candace, will have Monique, LPN  for the pt today be on the tele pre op appt. I did state that Gabriel Rung will need to be there by the pt, Candace said that it is fine.   We will call the PH# [phone] [facility] and ask to s/w Moniuqe, LPN @ 0:16, pt has been added on today due to med hold and date of procedure.   We agreed to fax notes to [facility] as well for the pt's chart there as well as faxing clearance notes to the surgeon's office. Candace did ask when should pt resume Eliquis. I advised that will need to addressed by Dr. Gershon Mussel as he will be able to assess bleeding standpoint. I did state generally it is resume the day of procedure or the next day, but again will need to come from Dr. Gershon Mussel. Candace and thanked each other for the team work in caring for the pt.   Meds not review, but consent has been given.

## 2023-03-13 NOTE — Progress Notes (Signed)
Virtual Visit via Telephone Note   Because of [name]'s co-morbid illnesses, she is at least at moderate risk for complications without adequate follow up.  This format is felt to be most appropriate for this patient at this time.  The patient did not have access to video technology/had technical difficulties with video requiring transitioning to audio format only (telephone).  All issues noted in this document were discussed and addressed.  No physical exam could be performed with this format.  Please refer to the patient's chart for her consent to telehealth for [facility].  Evaluation Performed:  Preoperative cardiovascular risk assessment _____________   Date:  03/13/2023   Patient ID:  [name], DOB February 25, 1941, MRN [id number] Patient Location:  Home Provider location:   Office  Primary Care Provider:  Charlynne Pander, MD Primary Cardiologist:  None  Chief Complaint / Patient Profile   82 y.o. y/o female with a h/o atrial fibrillation who is pending extracorporeal shockwave lithotripsy and presents today for telephonic preoperative cardiovascular risk assessment.  History of Present Illness    [name] is a 82 y.o. female who presents via Web designer for a telehealth visit today.  Pt was last seen in cardiology clinic on 08/23/2022 by Dr. Ladona Ridgel.  At that time [name] was doing well.  The patient is now pending procedure as outlined above. Since her last visit, she continues to be stable from a cardiac standpoint.  Today she denies chest pain, shortness of breath, lower extremity edema, fatigue, palpitations, melena, hematuria, hemoptysis, diaphoresis, weakness, presyncope, syncope, orthopnea, and PND.   Past Medical History    Past Medical History:  Diagnosis Date   Anemia    Anginal pain (HCC)    "all the time, but not heart related"   Arthritis    "legs and back" (09/30/2013)   Asthma    years ago   Atrial fibrillation (HCC)     AV block 1993   s/p dual-chamber PPM, 1993, Medtronic Kappa; generator change-2003    Blurred vision, bilateral    sometimes double vision   CAD (coronary artery disease)    multivessel, CABG 6/10.  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  Myoview (11/20/11) showed no ischemia & EF 62%   Chronic lower back pain    Confusion    COPD (chronic obstructive pulmonary disease) (HCC)    severe lung disease by PFTs 6/12   DDD (degenerative disc disease)    Deep vein thrombosis, upper right extremity (HCC)    post-PPM   Dehiscence of closure of sternum or sternotomy 07/2009   sternal infection; required flap closure   Dementia (HCC)    Diabetes mellitus, type II (HCC)    Gastroesophageal reflux disease    not much   H/O hiatal hernia    History of kidney stones    Hyperlipidemia    Hypertension    Migraines    "years ago" (09/30/2013)   Obesity    Orthopnea    sometimes   Pacemaker    PUD (peptic ulcer disease)    Seizures (HCC)    "long time ago; don't remember what they were related to" (09/30/2013)none recent   Sleep apnea    denies CPAP use on 09/30/2013   Stroke [facility])    years ago, "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 EGD with small hiatal hernia, healed gastric ulcer   CORONARY ARTERY BYPASS GRAFT  05/1999   - LIMA to LAD, SVG to Wolf, SVG to PDA   ESOPHAGOGASTRODUODENOSCOPY     with [facility] dilation,  biopsy, and disruption Schatzki's ring   ESOPHAGOGASTRODUODENOSCOPY  09/10/2012   RMR: Noncritical Schatzki's ring. Diffuse gastric erosions and an  area of partially healing ulceration-status post biopsy/ Small hiatal  hernia. Bx reactive gastropathy.   INSERT / REPLACE / REMOVE PACEMAKER  1993   JOINT REPLACEMENT     LUMBAR DISC SURGERY     PACEMAKER PLACEMENT  1993   Status post DDD pacemaker implantation in 1993, with Medtronic Kappa generator change in 2003 for  second-degree AV block, most recent gen change by JA 04/14/11   PORTACATH PLACEMENT Left 07/17/2013   Procedure: INSERTION PORT-A-CATH-left subclavian;  Surgeon: Fabio Bering, MD;  Location: [facility];  Service: General;  Laterality: Left;   POSTERIOR LUMBAR FUSION     RECONSTRUCTIVE REPAIR STERNAL     for infection s/p CABG 8/10   REPLACEMENT TOTAL KNEE Bilateral    THYROIDECTOMY     TOTAL HIP ARTHROPLASTY Right    TOTAL HIP ARTHROPLASTY Left 12/03/2013   Procedure: LEFT TOTAL HIP ARTHROPLASTY ANTERIOR APPROACH;  Surgeon: Shelda Pal, MD;  Location: [facility];  Service: Orthopedics;  Laterality: Left;   TUBAL LIGATION     WRIST SURGERY Left    "tumor taken off" (09/30/2013)    Allergies  Allergies  Allergen Reactions   Codeine Other (See Comments)    Reaction:  Headaches    Food Nausea And Vomiting and Other (See Comments)    Pt is allergic to leafy raw vegetables and seeds.     Levaquin [Levofloxacin] Other (See Comments)    Confusion, altered mental status, inability to walk   Oxycodone Hcl Other (See Comments)    Reaction:  Headaches    Peanut-Containing Drug Products Nausea And Vomiting   Reglan [Metoclopramide] Other (See Comments)    Reaction:  Confusion     Home Medications    Prior to Admission medications   Medication Sig Start Date End Date Taking? Authorizing Provider  acetaminophen (TYLENOL) 325 MG tablet Take 650 mg by mouth every 4 (four) hours as needed for mild pain, fever or headache.    [provider]  apixaban (ELIQUIS) 5 MG TABS tablet Take 5 mg by mouth 2 (two) times daily.     [provider]  atorvastatin (LIPITOR) 10 MG tablet Take 10 mg by mouth at bedtime.  Patient not taking:  Reported on 08/23/2022    [provider]  dorzolamide (TRUSOPT) 2 % ophthalmic solution 1 drop 3 (three) times daily. 08/19/22   [provider]  famotidine (PEPCID) 20 MG tablet Take 20 mg by mouth daily. Patient not taking: Reported on 08/11/2021    [provider]  ferrous sulfate 325 (65 FE) MG tablet Take 325 mg by mouth daily with breakfast. Patient not taking: Reported on 08/11/2021    [provider]  furosemide (LASIX) 40 MG tablet Take 1 tablet (40 mg total) by mouth daily. 08/23/22 08/18/23  Marinus Maw, MD  gabapentin (NEURONTIN) 100 MG capsule Take 200 mg by mouth at  bedtime. Patient not taking: Reported on 08/11/2021    [provider]  levETIRAcetam (KEPPRA) 100 MG/ML solution Take 500 mg by mouth 2 (two) times daily. 08/02/21   [provider]  linaclotide Karlene Einstein(LINZESS) 145 MCG CAPS capsule Take 145 mcg by mouth daily before breakfast. Patient not taking: Reported on 08/11/2021    [provider]  Melatonin 5 MG TABS Take 5 mg by mouth at bedtime as needed (for sleep).    [provider]  metoprolol tartrate (LOPRESSOR) 25 MG tablet Take 1 tablet (25 mg total) by mouth 2 (two) times daily. 08/23/22 08/18/23  Marinus Mawaylor, Gregg W, MD  nitroGLYCERIN (NITRODUR - DOSED IN MG/24 HR) 0.4 mg/hr patch Place 0.4 mg onto the skin as needed. Give 1 tablet sublingually every 5 minutes as needed fo chest pain    [provider]  potassium chloride (K-DUR,KLOR-CON) 10 MEQ tablet Take 10 mEq by mouth daily. Patient not taking: Reported on 08/11/2021    [provider]  RHOPRESSA 0.02 % SOLN  08/09/22   [provider]  senna-docusate (SENOKOT-S) 8.6-50 MG tablet Take 1 tablet by mouth 2 (two) times daily. Patient not taking: Reported on 08/11/2021    [provider]  sertraline (ZOLOFT) 50 MG tablet Take 50 mg by mouth daily. Patient not taking: Reported on 08/11/2021    [provider]  SPIRIVA  HANDIHALER 18 MCG inhalation capsule 1 capsule daily. 06/26/22   [provider]  traMADol (ULTRAM) 50 MG tablet Take 50 mg by mouth every 4 (four) hours as needed for moderate pain. Give 1 tablet by mouth every 4 hours as needed for pain Patient not taking: Reported on 08/11/2021    [provider]  traZODone (DESYREL) 50 MG tablet Take 50 mg by mouth at bedtime. Patient not taking: Reported on 08/11/2021    [provider]  Vitamin D, Ergocalciferol, (DRISDOL) 1.25 MG (50000 UT) CAPS capsule Take 50,000 Units by mouth every 7 (seven) days. Patient not taking: Reported on 08/11/2021    [provider]    Physical Exam    Vital Signs:  [name] does not have vital signs available for review today.  Given telephonic nature of communication, physical exam is limited. AAOx3. NAD. Normal affect.  Speech and respirations are unlabored.  Accessory Clinical Findings    None  Assessment & Plan    1.  Preoperative Cardiovascular Risk Assessment:  extracorporeal shockwave lithotripsy, [facility] , scheduled for 03/15/2023, Dr. Gershon Musselarbone, fax #[phone]      Primary Cardiologist: None  Chart reviewed as part of pre-operative protocol coverage. Given past medical history and time since last visit, based on ACC/AHA guidelines, [name] would be at acceptable risk for the planned procedure without further cardiovascular testing.   Her Eliquis may be held for 1-2 days prior to procedure.  Please resume as soon as hemostasis is achieved.   Patient was advised that if she develops new symptoms prior to surgery to contact our office to arrange a follow-up appointment.  She verbalized understanding.  I will route this recommendation to the requesting party via Epic fax function and remove from pre-op pool.       Time:   Today, I have spent 6 minutes with the patient with telehealth technology discussing medical history, symptoms, and  management plan.  Prior to her phone evaluation I spent greater than 10 minutes reviewing her past medical history and cardiac medications.   Ronney AstersJesse M Nayef College,  NP  03/13/2023, 2:03 PM

## 2023-03-13 NOTE — Telephone Encounter (Signed)
   Name: [name]  DOB: Oct 06, 1941  MRN: [id number]  Primary Cardiologist: None   Preoperative team, please contact this patient and set up a phone call appointment for further preoperative risk assessment. Please obtain consent and complete medication review. Thank you for your help.  I confirm that guidance regarding antiplatelet and oral anticoagulation therapy has been completed and, if necessary, noted below.  Her Eliquis may be held for 1-2 days prior to procedure.  Please resume as soon as hemostasis is achieved.   Ronney Asters, NP 03/13/2023, 1:26 PM [facility]

## 2023-03-13 NOTE — Telephone Encounter (Signed)
Steward Drone calling with [facility] is calling to get update on clearance for patient.

## 2023-03-13 NOTE — Telephone Encounter (Signed)
I s/w [facility] in regard to lab results to be faxed over to or office, though we have not received anything yet from them.  RN said she will have the manager call our office. I did state that we are trying to clear the pt for another doctor she has a procedure in 2 days coming up and we need lab results. I left my direct line [phone].

## 2023-03-13 NOTE — Telephone Encounter (Signed)
I called Steward Drone at [facility] and updated that the pt has been cleared. I will re-fax clearance notes.

## 2023-03-13 NOTE — Telephone Encounter (Signed)
Yes Jan labs are just fine to use. CrCl is 59mL/min using adjusted body weight. Ok to hold Eliquis for 1-2 days prior to procedure.

## 2023-03-14 ENCOUNTER — Telehealth: Payer: Self-pay | Admitting: Internal Medicine

## 2023-03-14 NOTE — Telephone Encounter (Signed)
I s/w [facility]. She did tell me when she answered the phone that she did receive the faxes from this morning and apologized for the call.

## 2023-03-14 NOTE — Telephone Encounter (Signed)
I will fax over EKG from 08/2022 is the most recent on file. I called requesting office to see if there was an alternate fax# as the one given is busy x 2. I s/w Harrison Mons and he gave me [phone].

## 2023-03-14 NOTE — Telephone Encounter (Signed)
Augusto Gamble, from [facility] called stating they received clearance yesterday on this patient, however they also need a copy of the most recent EKG sent to them.  There fax number [phone].

## 2023-03-14 NOTE — Telephone Encounter (Signed)
Steward Drone from [facility] is calling back stating that fax was not received. I verified fax # as the one given with [facility]. Requesting another fax be sent.

## 2023-12-13 ENCOUNTER — Ambulatory Visit: Payer: Medicare Other | Attending: Internal Medicine | Admitting: Internal Medicine

## 2023-12-13 ENCOUNTER — Encounter: Payer: Self-pay | Admitting: Internal Medicine

## 2023-12-13 VITALS — BP 124/56 | HR 61 | Ht 62.0 in | Wt 158.0 lb

## 2023-12-13 DIAGNOSIS — I4891 Unspecified atrial fibrillation: Secondary | ICD-10-CM | POA: Diagnosis not present

## 2023-12-13 NOTE — Progress Notes (Signed)
 HPI [name] returns today for followup of her PPM and atrial fib. She is a pleasant 83 yo woman with dementia who resides in a SNF with perm atrial fib, sinus node dysfunction, s/p PPM insertion, HTN and insomnia.  She denies chest pain or sob.   Allergies  Allergen Reactions   Codeine Other (See Comments)    Reaction:  Headaches    Food Nausea And Vomiting and Other (See Comments)    Pt is allergic to leafy raw vegetables and seeds.     Levaquin  [Levofloxacin ] Other (See Comments)    Confusion, altered mental status, inability to walk   Oxycodone Hcl Other (See Comments)    Reaction:  Headaches    Peanut-Containing Drug Products Nausea And Vomiting   Reglan  [Metoclopramide ] Other (See Comments)    Reaction:  Confusion      Current Outpatient Medications  Medication Sig Dispense Refill   acetaminophen  (TYLENOL ) 325 MG tablet Take 650 mg by mouth every 4 (four) hours as needed for mild pain, fever or headache.     amLODipine  (NORVASC ) 5 MG tablet Take 5 mg by mouth daily.     apixaban  (ELIQUIS ) 5 MG TABS tablet Take 5 mg by mouth 2 (two) times daily.      dorzolamide (TRUSOPT) 2 % ophthalmic solution 1 drop 3 (three) times daily.     furosemide  (LASIX ) 40 MG tablet Take 1 tablet (40 mg total) by mouth daily. 90 tablet 3   levETIRAcetam  (KEPPRA ) 100 MG/ML solution Take 500 mg by mouth 2 (two) times daily.     Melatonin 5 MG TABS Take 5 mg by mouth at bedtime as needed (for sleep).     metoprolol  tartrate (LOPRESSOR ) 25 MG tablet Take 1 tablet (25 mg total) by mouth 2 (two) times daily. 180 tablet 3   nitroGLYCERIN  (NITRODUR - DOSED IN MG/24 HR) 0.4 mg/hr patch Place 0.4 mg onto the skin as needed. Give 1 tablet sublingually every 5 minutes as needed fo chest pain     RHOPRESSA 0.02 % SOLN      SPIRIVA HANDIHALER 18 MCG inhalation capsule 1 capsule daily.     traMADol  (ULTRAM ) 50 MG tablet Take 50 mg by mouth every 4 (four) hours as needed for moderate pain (pain score 4-6).  Give 1 tablet by mouth every 4 hours as needed for pain     traZODone  (DESYREL ) 50 MG tablet Take 50 mg by mouth at bedtime.     Vitamin D, Ergocalciferol, (DRISDOL) 1.25 MG (50000 UT) CAPS capsule Take 50,000 Units by mouth every 7 (seven) days.     atorvastatin  (LIPITOR) 10 MG tablet Take 10 mg by mouth at bedtime.  (Patient not taking: Reported on 08/23/2022)     famotidine (PEPCID) 20 MG tablet Take 20 mg by mouth daily. (Patient not taking: Reported on 08/11/2021)     ferrous sulfate  325 (65 FE) MG tablet Take 325 mg by mouth daily with breakfast. (Patient not taking: Reported on 08/11/2021)     gabapentin  (NEURONTIN ) 100 MG capsule Take 200 mg by mouth at bedtime. (Patient not taking: Reported on 08/11/2021)     linaclotide (LINZESS) 145 MCG CAPS capsule Take 145 mcg by mouth daily before breakfast. (Patient not taking: Reported on 08/11/2021)     potassium chloride  (K-DUR,KLOR-CON ) 10 MEQ tablet Take 10 mEq by mouth daily. (Patient not taking: Reported on 08/11/2021)     senna-docusate (SENOKOT-S) 8.6-50 MG tablet Take 1 tablet by mouth 2 (two)  times daily. (Patient not taking: Reported on 08/11/2021)     sertraline (ZOLOFT) 50 MG tablet Take 50 mg by mouth daily. (Patient not taking: Reported on 08/11/2021)     No current facility-administered medications for this visit.     Past Medical History:  Diagnosis Date   Anemia    Anginal pain (HCC)    all the time, but not heart related   Arthritis    legs and back (09/30/2013)   Asthma    years ago   Atrial fibrillation (HCC)    AV block 1993   s/p dual-chamber PPM, 1993, Medtronic Kappa; generator change-2003    Blurred vision, bilateral    sometimes double vision   CAD (coronary artery disease)    multivessel, CABG 6/10.  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  Myoview  (11/20/11) showed no ischemia & EF 62%   Chronic lower back pain    Confusion    COPD (chronic obstructive pulmonary disease) (HCC)    severe lung disease by PFTs 6/12   DDD (degenerative  disc disease)    Deep vein thrombosis, upper right extremity (HCC)    post-PPM   Dehiscence of closure of sternum or sternotomy 07/2009   sternal infection; required flap closure   Dementia (HCC)    Diabetes mellitus, type II (HCC)    Gastroesophageal reflux disease    not much   H/O hiatal hernia    History of kidney stones    Hyperlipidemia    Hypertension    Migraines    years ago (09/30/2013)   Obesity    Orthopnea    sometimes   Pacemaker    PUD (peptic ulcer disease)    Seizures (HCC)    long time ago; don't remember what they were related to (09/30/2013)none recent   Sleep apnea    denies CPAP use on 09/30/2013   Stroke (HCC)    years ago, residual on left back   Urge incontinence of urine     ROS:   All systems reviewed and negative except as noted in the HPI.   Past Surgical History:  Procedure Laterality Date   ABDOMINAL HYSTERECTOMY     partial   APPENDECTOMY     BACK SURGERY     x3 total   CESAREAN SECTION  ~1980   CHOLECYSTECTOMY     COLONOSCOPY  04/27/2005   hemorrhoids, diverticula   COLONOSCOPY WITH ESOPHAGOGASTRODUODENOSCOPY (EGD) N/A 02/20/2013   RMR: pancolonic diverticulosis and internal hemorrhoids. EGD with small hiatal hernia, healed gastric ulcer   CORONARY ARTERY BYPASS GRAFT  05/1999   - LIMA to LAD, SVG to OM, SVG to PDA   ESOPHAGOGASTRODUODENOSCOPY     with [facility] dilation,  biopsy, and disruption Schatzki's ring   ESOPHAGOGASTRODUODENOSCOPY  09/10/2012   RMR: Noncritical Schatzki's ring. Diffuse gastric erosions and an  area of partially healing ulceration-status post biopsy/ Small hiatal hernia. Bx reactive gastropathy.   INSERT / REPLACE / REMOVE PACEMAKER  1993   JOINT REPLACEMENT     LUMBAR DISC SURGERY     PACEMAKER PLACEMENT  1993   Status post DDD pacemaker implantation in 1993, with Medtronic Kappa generator change in 2003 for  second-degree AV block, most recent gen change by JA 04/14/11   PORTACATH PLACEMENT Left  07/17/2013   Procedure: INSERTION PORT-A-CATH-left subclavian;  Surgeon: Thresa JAYSON Pulling, MD;  Location: [facility];  Service: General;  Laterality: Left;   POSTERIOR LUMBAR FUSION     RECONSTRUCTIVE REPAIR STERNAL     for  infection s/p CABG 8/10   REPLACEMENT TOTAL KNEE Bilateral    THYROIDECTOMY     TOTAL HIP ARTHROPLASTY Right    TOTAL HIP ARTHROPLASTY Left 12/03/2013   Procedure: LEFT TOTAL HIP ARTHROPLASTY ANTERIOR APPROACH;  Surgeon: Donnice JONETTA Car, MD;  Location: [facility];  Service: Orthopedics;  Laterality: Left;   TUBAL LIGATION     WRIST SURGERY Left    tumor taken off (09/30/2013)     Family History  Problem Relation Age of Onset   Heart disease Mother        MI   Colon cancer Father        age 61   Diabetes Other    Coronary artery disease Other        Female <55   Cancer Other      Social History   Socioeconomic History   Marital status: Widowed    Spouse name: Not on file   Number of children: 5   Years of education: Not on file   Highest education level: Not on file  Occupational History    Employer: RETIRED  Tobacco Use   Smoking status: Never   Smokeless tobacco: Former    Types: Snuff   Tobacco comments:    09/30/2013 stopped snuff a long time ago  Vaping Use   Vaping status: Never Used  Substance and Sexual Activity   Alcohol use: No   Drug use: No   Sexual activity: Never    Birth control/protection: Post-menopausal, Surgical  Other Topics Concern   Not on file  Social History Narrative   Lives at home with daughter, son-in-law   Caffeine use-  none   Social Drivers of Corporate Investment Banker Strain: Not on file  Food Insecurity: Not on file  Transportation Needs: Not on file  Physical Activity: Not on file  Stress: Not on file  Social Connections: Not on file  Intimate Partner Violence: Not on file     BP (!) 124/56   Pulse 61   Ht 5' 2 (1.575 m)   Wt 158 lb (71.7 kg)   SpO2 93%   BMI 28.90 kg/m   Physical  Exam:  Elderly appearing NAD HEENT: Unremarkable Neck:  No JVD, no thyromegally Lymphatics:  No adenopathy Back:  No CVA tenderness Lungs:  Clear HEART:  Regular rate rhythm, no murmurs, no rubs, no clicks Abd:  soft, positive bowel sounds, no organomegally, no rebound, no guarding Ext:  2 plus pulses, no edema, no cyanosis, no clubbing Skin:  No rashes no nodules Neuro:  CN II through XII intact, motor grossly intact  EKG - afib with a controlled VR  DEVICE  Normal device function.  See PaceArt for details.   Assess/Plan:  1. Perm atrial fib - her rates are controlled. She does not have palpitations. She will continue eliquis . 2. PPM -her medtronic DDD PM is working normally. We will recheck in several months. She is programmed VVI. 3. Insomnia - resolved 4. HTN - her bp is reasonably well controlled. No change in meds. 5. Peripheral edema - I have adjusted her meds. And her edema is much improved.  [name]

## 2023-12-13 NOTE — Patient Instructions (Signed)

## 2023-12-14 LAB — CUP PACEART INCLINIC DEVICE CHECK
Date Time Interrogation Session: 20250108194343
Implantable Lead Connection Status: 753985
Implantable Lead Connection Status: 753985
Implantable Lead Implant Date: 19930812
Implantable Lead Implant Date: 19930812
Implantable Lead Location: 753859
Implantable Lead Location: 753860
Implantable Lead Model: 501154
Implantable Pulse Generator Implant Date: 20120510
